# Patient Record
Sex: Female | Born: 1935
Health system: Southern US, Community
[De-identification: ages and names within clinical notes are randomized; demographics above are authoritative.]

## PROBLEM LIST (undated history)

## (undated) DIAGNOSIS — R269 Unspecified abnormalities of gait and mobility: Secondary | ICD-10-CM

## (undated) DIAGNOSIS — Z85820 Personal history of malignant melanoma of skin: Secondary | ICD-10-CM

## (undated) DIAGNOSIS — I5032 Chronic diastolic (congestive) heart failure: Secondary | ICD-10-CM

## (undated) DIAGNOSIS — R413 Other amnesia: Principal | ICD-10-CM

## (undated) DIAGNOSIS — I10 Essential (primary) hypertension: Secondary | ICD-10-CM

## (undated) DIAGNOSIS — I679 Cerebrovascular disease, unspecified: Secondary | ICD-10-CM

## (undated) DIAGNOSIS — M797 Fibromyalgia: Secondary | ICD-10-CM

## (undated) DIAGNOSIS — Z720 Tobacco use: Secondary | ICD-10-CM

## (undated) DIAGNOSIS — I482 Chronic atrial fibrillation, unspecified: Secondary | ICD-10-CM

## (undated) DIAGNOSIS — M1990 Unspecified osteoarthritis, unspecified site: Secondary | ICD-10-CM

## (undated) DIAGNOSIS — F329 Major depressive disorder, single episode, unspecified: Secondary | ICD-10-CM

## (undated) DIAGNOSIS — J339 Nasal polyp, unspecified: Secondary | ICD-10-CM

## (undated) DIAGNOSIS — I69398 Other sequelae of cerebral infarction: Secondary | ICD-10-CM

## (undated) DIAGNOSIS — I2510 Atherosclerotic heart disease of native coronary artery without angina pectoris: Secondary | ICD-10-CM

## (undated) DIAGNOSIS — G4700 Insomnia, unspecified: Secondary | ICD-10-CM

## (undated) DIAGNOSIS — N183 Chronic kidney disease, stage 3 unspecified: Secondary | ICD-10-CM

## (undated) DIAGNOSIS — E039 Hypothyroidism, unspecified: Secondary | ICD-10-CM

## (undated) DIAGNOSIS — E785 Hyperlipidemia, unspecified: Secondary | ICD-10-CM

## (undated) DIAGNOSIS — J189 Pneumonia, unspecified organism: Secondary | ICD-10-CM

## (undated) DIAGNOSIS — I119 Hypertensive heart disease without heart failure: Secondary | ICD-10-CM

## (undated) DIAGNOSIS — I5042 Chronic combined systolic (congestive) and diastolic (congestive) heart failure: Secondary | ICD-10-CM

## (undated) DIAGNOSIS — F32A Depression, unspecified: Secondary | ICD-10-CM

## (undated) DIAGNOSIS — I69359 Hemiplegia and hemiparesis following cerebral infarction affecting unspecified side: Secondary | ICD-10-CM

## (undated) DIAGNOSIS — J439 Emphysema, unspecified: Secondary | ICD-10-CM

## (undated) DIAGNOSIS — M4802 Spinal stenosis, cervical region: Secondary | ICD-10-CM

## (undated) DIAGNOSIS — K219 Gastro-esophageal reflux disease without esophagitis: Secondary | ICD-10-CM

## (undated) DIAGNOSIS — I639 Cerebral infarction, unspecified: Secondary | ICD-10-CM

## (undated) HISTORY — DX: Major depressive disorder, single episode, unspecified: F32.9

## (undated) HISTORY — DX: Other amnesia: R41.3

## (undated) HISTORY — DX: Spinal stenosis, cervical region: M48.02

## (undated) HISTORY — DX: Other sequelae of cerebral infarction: I69.398

## (undated) HISTORY — DX: Cerebral infarction, unspecified: I63.9

## (undated) HISTORY — DX: Insomnia, unspecified: G47.00

## (undated) HISTORY — PX: TONSILLECTOMY: SUR1361

## (undated) HISTORY — PX: OTHER SURGICAL HISTORY: SHX169

## (undated) HISTORY — DX: Hemiplegia and hemiparesis following cerebral infarction affecting unspecified side: I69.359

## (undated) HISTORY — DX: Essential (primary) hypertension: I10

## (undated) HISTORY — DX: Atherosclerotic heart disease of native coronary artery without angina pectoris: I25.10

## (undated) HISTORY — DX: Emphysema, unspecified: J43.9

## (undated) HISTORY — DX: Hyperlipidemia, unspecified: E78.5

## (undated) HISTORY — DX: Pneumonia, unspecified organism: J18.9

## (undated) HISTORY — PX: ABDOMINAL HYSTERECTOMY: SHX81

## (undated) HISTORY — DX: Depression, unspecified: F32.A

## (undated) HISTORY — DX: Unspecified abnormalities of gait and mobility: R26.9

## (undated) HISTORY — DX: Cerebrovascular disease, unspecified: I67.9

## (undated) HISTORY — DX: Chronic diastolic (congestive) heart failure: I50.32

## (undated) HISTORY — PX: BREAST LUMPECTOMY: SHX2

## (undated) HISTORY — DX: Unspecified osteoarthritis, unspecified site: M19.90

## (undated) HISTORY — DX: Nasal polyp, unspecified: J33.9

## (undated) HISTORY — DX: Hypothyroidism, unspecified: E03.9

## (undated) HISTORY — DX: Fibromyalgia: M79.7

## (undated) HISTORY — PX: EYE SURGERY: SHX253

---

## 1998-02-10 HISTORY — PX: CORONARY ARTERY BYPASS GRAFT: SHX141

## 1998-07-16 ENCOUNTER — Inpatient Hospital Stay (HOSPITAL_COMMUNITY): Admission: AD | Admit: 1998-07-16 | Discharge: 1998-07-23 | Payer: Self-pay | Admitting: Cardiology

## 1998-07-16 ENCOUNTER — Encounter: Payer: Self-pay | Admitting: Cardiothoracic Surgery

## 1998-07-17 ENCOUNTER — Encounter: Payer: Self-pay | Admitting: Cardiothoracic Surgery

## 1998-07-18 ENCOUNTER — Encounter: Payer: Self-pay | Admitting: Cardiothoracic Surgery

## 1998-07-19 ENCOUNTER — Encounter: Payer: Self-pay | Admitting: Cardiothoracic Surgery

## 1998-07-20 ENCOUNTER — Encounter: Payer: Self-pay | Admitting: Cardiothoracic Surgery

## 1998-09-18 ENCOUNTER — Other Ambulatory Visit: Admission: RE | Admit: 1998-09-18 | Discharge: 1998-09-18 | Payer: Self-pay | Admitting: Obstetrics and Gynecology

## 1999-03-28 ENCOUNTER — Inpatient Hospital Stay (HOSPITAL_COMMUNITY): Admission: EM | Admit: 1999-03-28 | Discharge: 1999-04-03 | Payer: Self-pay | Admitting: Emergency Medicine

## 1999-03-28 ENCOUNTER — Encounter: Payer: Self-pay | Admitting: Emergency Medicine

## 1999-03-29 ENCOUNTER — Encounter: Payer: Self-pay | Admitting: Neurology

## 1999-04-03 ENCOUNTER — Inpatient Hospital Stay (HOSPITAL_COMMUNITY)
Admission: RE | Admit: 1999-04-03 | Discharge: 1999-04-18 | Payer: Self-pay | Admitting: Physical Medicine & Rehabilitation

## 1999-04-05 ENCOUNTER — Encounter: Payer: Self-pay | Admitting: Physical Medicine & Rehabilitation

## 1999-04-08 ENCOUNTER — Encounter: Payer: Self-pay | Admitting: Cardiology

## 1999-04-23 ENCOUNTER — Encounter
Admission: RE | Admit: 1999-04-23 | Discharge: 1999-07-22 | Payer: Self-pay | Admitting: Physical Medicine & Rehabilitation

## 1999-06-28 ENCOUNTER — Encounter: Admission: RE | Admit: 1999-06-28 | Discharge: 1999-09-02 | Payer: Self-pay | Admitting: *Deleted

## 1999-07-29 ENCOUNTER — Encounter
Admission: RE | Admit: 1999-07-29 | Discharge: 1999-07-29 | Payer: Self-pay | Admitting: Physical Medicine & Rehabilitation

## 1999-10-04 ENCOUNTER — Encounter: Payer: Self-pay | Admitting: Neurology

## 1999-10-04 ENCOUNTER — Ambulatory Visit (HOSPITAL_COMMUNITY): Admission: RE | Admit: 1999-10-04 | Discharge: 1999-10-04 | Payer: Self-pay | Admitting: Neurology

## 2000-02-24 ENCOUNTER — Other Ambulatory Visit: Admission: RE | Admit: 2000-02-24 | Discharge: 2000-02-24 | Payer: Self-pay | Admitting: Obstetrics and Gynecology

## 2001-05-14 ENCOUNTER — Other Ambulatory Visit: Admission: RE | Admit: 2001-05-14 | Discharge: 2001-05-14 | Payer: Self-pay | Admitting: Obstetrics and Gynecology

## 2002-10-24 ENCOUNTER — Other Ambulatory Visit: Admission: RE | Admit: 2002-10-24 | Discharge: 2002-10-24 | Payer: Self-pay | Admitting: Obstetrics and Gynecology

## 2004-03-31 ENCOUNTER — Emergency Department (HOSPITAL_COMMUNITY): Admission: EM | Admit: 2004-03-31 | Discharge: 2004-03-31 | Payer: Self-pay | Admitting: Emergency Medicine

## 2007-04-06 ENCOUNTER — Encounter: Admission: RE | Admit: 2007-04-06 | Discharge: 2007-04-06 | Payer: Self-pay | Admitting: Neurology

## 2007-12-06 ENCOUNTER — Inpatient Hospital Stay (HOSPITAL_COMMUNITY): Admission: EM | Admit: 2007-12-06 | Discharge: 2007-12-08 | Payer: Self-pay | Admitting: Emergency Medicine

## 2007-12-07 ENCOUNTER — Encounter (INDEPENDENT_AMBULATORY_CARE_PROVIDER_SITE_OTHER): Payer: Self-pay | Admitting: Neurology

## 2008-01-10 ENCOUNTER — Encounter: Admission: RE | Admit: 2008-01-10 | Discharge: 2008-02-10 | Payer: Self-pay | Admitting: Neurology

## 2008-02-17 ENCOUNTER — Encounter: Admission: RE | Admit: 2008-02-17 | Discharge: 2008-04-12 | Payer: Self-pay | Admitting: Neurology

## 2009-09-12 ENCOUNTER — Ambulatory Visit: Payer: Self-pay | Admitting: Cardiology

## 2009-09-26 ENCOUNTER — Ambulatory Visit: Payer: Self-pay | Admitting: Cardiology

## 2009-10-26 ENCOUNTER — Ambulatory Visit: Payer: Self-pay | Admitting: Cardiovascular Disease

## 2009-11-26 ENCOUNTER — Ambulatory Visit: Payer: Self-pay | Admitting: Cardiology

## 2009-12-27 ENCOUNTER — Ambulatory Visit: Payer: Self-pay | Admitting: Cardiology

## 2010-01-06 ENCOUNTER — Telehealth (INDEPENDENT_AMBULATORY_CARE_PROVIDER_SITE_OTHER): Payer: Self-pay | Admitting: *Deleted

## 2010-01-30 ENCOUNTER — Ambulatory Visit: Payer: Self-pay | Admitting: Cardiovascular Disease

## 2010-03-04 ENCOUNTER — Ambulatory Visit: Payer: Self-pay | Admitting: Cardiology

## 2010-03-11 ENCOUNTER — Ambulatory Visit: Payer: Self-pay | Admitting: Cardiology

## 2010-03-14 NOTE — Progress Notes (Signed)
Summary: Cardiology - Sinus Pressure  Phone Note Call from Patient Call back at Home Phone [phone]   Reason for Call: Talk to Doctor Summary of Call: Returned call to patient who is complaining of productive cough with yellow sputum, severe headache above the eyes and overall tension in her head.  She states this occurs yearly and usually resolves with a ZPak.  I asked the patient if she has a primary care physician and she says that TB takes care of all her medical issues.  She says he usually calls in a ZPak and it clears up.  Pt denies any difficulty breathing, fever/chills, or CP currently.  I have called in a [facility]Pak to the [facility] on [address], [address] with no RF.  Pt appreciated the call and will call back in several days if symptoms are not relieved.   Initial call taken by: Robbi Garter NP-PA,  January 06, 2010 8:07 AM

## 2010-04-03 ENCOUNTER — Ambulatory Visit (INDEPENDENT_AMBULATORY_CARE_PROVIDER_SITE_OTHER): Payer: Medicare Other | Admitting: Cardiology

## 2010-04-03 DIAGNOSIS — E7800 Pure hypercholesterolemia, unspecified: Secondary | ICD-10-CM

## 2010-04-03 DIAGNOSIS — I4891 Unspecified atrial fibrillation: Secondary | ICD-10-CM

## 2010-04-03 DIAGNOSIS — Z7901 Long term (current) use of anticoagulants: Secondary | ICD-10-CM

## 2010-04-18 ENCOUNTER — Encounter (INDEPENDENT_AMBULATORY_CARE_PROVIDER_SITE_OTHER): Payer: Medicare Other

## 2010-04-18 ENCOUNTER — Encounter: Payer: Self-pay | Admitting: Cardiology

## 2010-04-18 DIAGNOSIS — I4891 Unspecified atrial fibrillation: Secondary | ICD-10-CM

## 2010-04-18 DIAGNOSIS — Z7901 Long term (current) use of anticoagulants: Secondary | ICD-10-CM

## 2010-04-20 ENCOUNTER — Emergency Department (HOSPITAL_COMMUNITY): Payer: Medicare Other

## 2010-04-20 ENCOUNTER — Emergency Department (HOSPITAL_COMMUNITY)
Admission: EM | Admit: 2010-04-20 | Discharge: 2010-04-20 | Disposition: A | Discharge status: Home / Self Care | Payer: Medicare Other | Attending: Emergency Medicine | Admitting: Emergency Medicine

## 2010-04-20 DIAGNOSIS — R29898 Other symptoms and signs involving the musculoskeletal system: Secondary | ICD-10-CM | POA: Insufficient documentation

## 2010-04-20 DIAGNOSIS — I69998 Other sequelae following unspecified cerebrovascular disease: Secondary | ICD-10-CM | POA: Insufficient documentation

## 2010-04-20 DIAGNOSIS — X500XXA Overexertion from strenuous movement or load, initial encounter: Secondary | ICD-10-CM | POA: Insufficient documentation

## 2010-04-20 DIAGNOSIS — S82899A Other fracture of unspecified lower leg, initial encounter for closed fracture: Secondary | ICD-10-CM | POA: Insufficient documentation

## 2010-04-20 DIAGNOSIS — Y92009 Unspecified place in unspecified non-institutional (private) residence as the place of occurrence of the external cause: Secondary | ICD-10-CM | POA: Insufficient documentation

## 2010-04-27 ENCOUNTER — Inpatient Hospital Stay (HOSPITAL_COMMUNITY)
Admission: EM | Admit: 2010-04-27 | Discharge: 2010-05-03 | DRG: 292 | Disposition: A | Discharge status: Home Health | Payer: Medicare Other | Attending: Cardiology | Admitting: Cardiology

## 2010-04-27 ENCOUNTER — Encounter: Payer: Self-pay | Admitting: Internal Medicine

## 2010-04-27 ENCOUNTER — Emergency Department (HOSPITAL_COMMUNITY): Payer: Medicare Other

## 2010-04-27 DIAGNOSIS — D509 Iron deficiency anemia, unspecified: Secondary | ICD-10-CM | POA: Diagnosis present

## 2010-04-27 DIAGNOSIS — I509 Heart failure, unspecified: Secondary | ICD-10-CM | POA: Diagnosis present

## 2010-04-27 DIAGNOSIS — G4700 Insomnia, unspecified: Secondary | ICD-10-CM | POA: Diagnosis present

## 2010-04-27 DIAGNOSIS — Z79899 Other long term (current) drug therapy: Secondary | ICD-10-CM

## 2010-04-27 DIAGNOSIS — J4489 Other specified chronic obstructive pulmonary disease: Secondary | ICD-10-CM | POA: Diagnosis present

## 2010-04-27 DIAGNOSIS — I10 Essential (primary) hypertension: Secondary | ICD-10-CM | POA: Diagnosis present

## 2010-04-27 DIAGNOSIS — M069 Rheumatoid arthritis, unspecified: Secondary | ICD-10-CM | POA: Diagnosis present

## 2010-04-27 DIAGNOSIS — F3289 Other specified depressive episodes: Secondary | ICD-10-CM | POA: Diagnosis present

## 2010-04-27 DIAGNOSIS — M810 Age-related osteoporosis without current pathological fracture: Secondary | ICD-10-CM | POA: Diagnosis present

## 2010-04-27 DIAGNOSIS — M109 Gout, unspecified: Secondary | ICD-10-CM | POA: Diagnosis present

## 2010-04-27 DIAGNOSIS — J449 Chronic obstructive pulmonary disease, unspecified: Secondary | ICD-10-CM | POA: Diagnosis present

## 2010-04-27 DIAGNOSIS — F329 Major depressive disorder, single episode, unspecified: Secondary | ICD-10-CM | POA: Diagnosis present

## 2010-04-27 DIAGNOSIS — Z8249 Family history of ischemic heart disease and other diseases of the circulatory system: Secondary | ICD-10-CM

## 2010-04-27 DIAGNOSIS — N390 Urinary tract infection, site not specified: Secondary | ICD-10-CM | POA: Diagnosis present

## 2010-04-27 DIAGNOSIS — Z7901 Long term (current) use of anticoagulants: Secondary | ICD-10-CM

## 2010-04-27 DIAGNOSIS — IMO0001 Reserved for inherently not codable concepts without codable children: Secondary | ICD-10-CM | POA: Diagnosis present

## 2010-04-27 DIAGNOSIS — E785 Hyperlipidemia, unspecified: Secondary | ICD-10-CM | POA: Diagnosis present

## 2010-04-27 DIAGNOSIS — Z7902 Long term (current) use of antithrombotics/antiplatelets: Secondary | ICD-10-CM

## 2010-04-27 DIAGNOSIS — R5381 Other malaise: Secondary | ICD-10-CM | POA: Diagnosis present

## 2010-04-27 DIAGNOSIS — I2510 Atherosclerotic heart disease of native coronary artery without angina pectoris: Secondary | ICD-10-CM | POA: Diagnosis present

## 2010-04-27 DIAGNOSIS — K5900 Constipation, unspecified: Secondary | ICD-10-CM | POA: Diagnosis present

## 2010-04-27 DIAGNOSIS — Z951 Presence of aortocoronary bypass graft: Secondary | ICD-10-CM

## 2010-04-27 DIAGNOSIS — I5043 Acute on chronic combined systolic (congestive) and diastolic (congestive) heart failure: Principal | ICD-10-CM | POA: Diagnosis present

## 2010-04-27 DIAGNOSIS — M4802 Spinal stenosis, cervical region: Secondary | ICD-10-CM | POA: Diagnosis present

## 2010-04-27 DIAGNOSIS — I4891 Unspecified atrial fibrillation: Secondary | ICD-10-CM | POA: Diagnosis present

## 2010-04-27 DIAGNOSIS — Z87891 Personal history of nicotine dependence: Secondary | ICD-10-CM

## 2010-04-27 DIAGNOSIS — Z8673 Personal history of transient ischemic attack (TIA), and cerebral infarction without residual deficits: Secondary | ICD-10-CM

## 2010-04-27 LAB — URINE MICROSCOPIC-ADD ON

## 2010-04-27 LAB — DIFFERENTIAL
Basophils Absolute: 0.1 10*3/uL (ref 0.0–0.1)
Basophils Relative: 1 % (ref 0–1)
Eosinophils Absolute: 0.3 10*3/uL (ref 0.0–0.7)
Eosinophils Relative: 2 % (ref 0–5)
Lymphocytes Relative: 18 % (ref 12–46)
Monocytes Relative: 8 % (ref 3–12)
Neutrophils Relative %: 71 % (ref 43–77)

## 2010-04-27 LAB — HEPATIC FUNCTION PANEL: Alkaline Phosphatase: 40 U/L (ref 39–117)

## 2010-04-27 LAB — BASIC METABOLIC PANEL
CO2: 24 mEq/L (ref 19–32)
Calcium: 9.6 mg/dL (ref 8.4–10.5)
Creatinine, Ser: 1.14 mg/dL (ref 0.4–1.2)
GFR calc Af Amer: 57 mL/min — ABNORMAL LOW (ref >60)
Sodium: 136 mEq/L (ref 135–145)

## 2010-04-27 LAB — TSH: TSH: 3.214 u[IU]/mL (ref 0.350–4.500)

## 2010-04-27 LAB — CK TOTAL AND CKMB (NOT AT ARMC): CK, MB: 11.7 ng/mL (ref 0.3–4.0)

## 2010-04-27 LAB — CBC
HCT: 34.3 % — ABNORMAL LOW (ref 36.0–46.0)
MCH: 29.9 pg (ref 26.0–34.0)
MCHC: 32.9 g/dL (ref 30.0–36.0)
RBC: 3.78 MIL/uL — ABNORMAL LOW (ref 3.87–5.11)
RDW: 14.1 % (ref 11.5–15.5)
WBC: 10.9 10*3/uL — ABNORMAL HIGH (ref 4.0–10.5)

## 2010-04-27 LAB — RAPID URINE DRUG SCREEN, HOSP PERFORMED
Amphetamines: NOT DETECTED
Benzodiazepines: NOT DETECTED
Opiates: NOT DETECTED

## 2010-04-27 LAB — URINALYSIS, ROUTINE W REFLEX MICROSCOPIC
Ketones, ur: NEGATIVE mg/dL
Protein, ur: NEGATIVE mg/dL

## 2010-04-27 LAB — HEMOGLOBIN A1C: Mean Plasma Glucose: 134 mg/dL — ABNORMAL HIGH (ref <117)

## 2010-04-27 LAB — CARDIAC PANEL(CRET KIN+CKTOT+MB+TROPI)
CK, MB: 10.8 ng/mL (ref 0.3–4.0)
Troponin I: 0.01 ng/mL (ref 0.00–0.06)

## 2010-04-27 LAB — BRAIN NATRIURETIC PEPTIDE: Pro B Natriuretic peptide (BNP): 501 pg/mL — ABNORMAL HIGH (ref 0.0–100.0)

## 2010-04-28 ENCOUNTER — Inpatient Hospital Stay (HOSPITAL_COMMUNITY): Payer: Medicare Other

## 2010-04-28 DIAGNOSIS — R0602 Shortness of breath: Secondary | ICD-10-CM

## 2010-04-28 DIAGNOSIS — N390 Urinary tract infection, site not specified: Secondary | ICD-10-CM

## 2010-04-28 DIAGNOSIS — R0989 Other specified symptoms and signs involving the circulatory and respiratory systems: Secondary | ICD-10-CM

## 2010-04-28 DIAGNOSIS — R4182 Altered mental status, unspecified: Secondary | ICD-10-CM

## 2010-04-28 DIAGNOSIS — R0609 Other forms of dyspnea: Secondary | ICD-10-CM

## 2010-04-28 LAB — LIPID PANEL
Cholesterol: 235 mg/dL — ABNORMAL HIGH (ref 0–200)
HDL: 39 mg/dL — ABNORMAL LOW (ref >39)
LDL Cholesterol: 161 mg/dL — ABNORMAL HIGH (ref 0–99)
Total CHOL/HDL Ratio: 6 RATIO

## 2010-04-28 LAB — CARDIAC PANEL(CRET KIN+CKTOT+MB+TROPI)
CK, MB: 7.8 ng/mL (ref 0.3–4.0)
CK, MB: 7.3 ng/mL (ref 0.3–4.0)
Relative Index: 2.6 — ABNORMAL HIGH (ref 0.0–2.5)
Total CK: 298 U/L — ABNORMAL HIGH (ref 7–177)
Total CK: 321 U/L — ABNORMAL HIGH (ref 7–177)
Troponin I: 0.01 ng/mL (ref 0.00–0.06)
Troponin I: 0.02 ng/mL (ref 0.00–0.06)

## 2010-04-28 LAB — IRON AND TIBC
Iron: 38 ug/dL — ABNORMAL LOW (ref 42–135)
Saturation Ratios: 16 % — ABNORMAL LOW (ref 20–55)
TIBC: 245 ug/dL — ABNORMAL LOW (ref 250–470)

## 2010-04-28 LAB — FOLATE: Folate: >20 ng/mL

## 2010-04-28 LAB — CBC
Hemoglobin: 9.5 g/dL — ABNORMAL LOW (ref 12.0–15.0)
MCH: 30.5 pg (ref 26.0–34.0)
MCV: 90 fL (ref 78.0–100.0)
Platelets: 224 10*3/uL (ref 150–400)
WBC: 7.3 10*3/uL (ref 4.0–10.5)

## 2010-04-28 LAB — FERRITIN: Ferritin: 173 ng/mL (ref 10–291)

## 2010-04-28 LAB — PROTIME-INR
INR: 2.41 — ABNORMAL HIGH (ref 0.00–1.49)
Prothrombin Time: 26.4 seconds — ABNORMAL HIGH (ref 11.6–15.2)

## 2010-04-28 LAB — BASIC METABOLIC PANEL
BUN: 20 mg/dL (ref 6–23)
Calcium: 8.7 mg/dL (ref 8.4–10.5)
Chloride: 103 mEq/L (ref 96–112)
Creatinine, Ser: 0.96 mg/dL (ref 0.4–1.2)
GFR calc non Af Amer: 57 mL/min — ABNORMAL LOW (ref >60)
Sodium: 137 mEq/L (ref 135–145)

## 2010-04-28 MED ORDER — IOHEXOL 300 MG/ML  SOLN
80.0000 mL | Freq: Once | INTRAMUSCULAR | Status: AC | PRN
  Administered 2010-04-28: 80 mL via INTRAVENOUS

## 2010-04-29 DIAGNOSIS — I517 Cardiomegaly: Secondary | ICD-10-CM

## 2010-04-29 DIAGNOSIS — R4182 Altered mental status, unspecified: Secondary | ICD-10-CM

## 2010-04-29 DIAGNOSIS — R0989 Other specified symptoms and signs involving the circulatory and respiratory systems: Secondary | ICD-10-CM

## 2010-04-29 DIAGNOSIS — N390 Urinary tract infection, site not specified: Secondary | ICD-10-CM

## 2010-04-29 DIAGNOSIS — R0602 Shortness of breath: Secondary | ICD-10-CM

## 2010-04-29 DIAGNOSIS — R0609 Other forms of dyspnea: Secondary | ICD-10-CM

## 2010-04-29 DIAGNOSIS — R079 Chest pain, unspecified: Secondary | ICD-10-CM

## 2010-04-29 LAB — CBC
HCT: 30.3 % — ABNORMAL LOW (ref 36.0–46.0)
MCH: 29.8 pg (ref 26.0–34.0)
MCV: 91.3 fL (ref 78.0–100.0)
Platelets: 247 10*3/uL (ref 150–400)
RDW: 14.1 % (ref 11.5–15.5)

## 2010-04-29 LAB — BASIC METABOLIC PANEL
BUN: 13 mg/dL (ref 6–23)
CO2: 27 mEq/L (ref 19–32)
Chloride: 104 mEq/L (ref 96–112)
Creatinine, Ser: 0.83 mg/dL (ref 0.4–1.2)
Potassium: 3.9 mEq/L (ref 3.5–5.1)

## 2010-04-30 DIAGNOSIS — R0989 Other specified symptoms and signs involving the circulatory and respiratory systems: Secondary | ICD-10-CM

## 2010-04-30 DIAGNOSIS — R0609 Other forms of dyspnea: Secondary | ICD-10-CM

## 2010-04-30 LAB — PROTIME-INR
INR: 2.1 — ABNORMAL HIGH (ref 0.00–1.49)
Prothrombin Time: 23.7 seconds — ABNORMAL HIGH (ref 11.6–15.2)

## 2010-04-30 LAB — BASIC METABOLIC PANEL
BUN: 8 mg/dL (ref 6–23)
Chloride: 101 mEq/L (ref 96–112)
Creatinine, Ser: 0.79 mg/dL (ref 0.4–1.2)
Glucose, Bld: 118 mg/dL — ABNORMAL HIGH (ref 70–99)
Potassium: 3.4 mEq/L — ABNORMAL LOW (ref 3.5–5.1)

## 2010-04-30 LAB — CBC
HCT: 29.3 % — ABNORMAL LOW (ref 36.0–46.0)
MCH: 30 pg (ref 26.0–34.0)
MCHC: 32.8 g/dL (ref 30.0–36.0)
RDW: 14.1 % (ref 11.5–15.5)

## 2010-04-30 LAB — URINE CULTURE

## 2010-04-30 LAB — BRAIN NATRIURETIC PEPTIDE: Pro B Natriuretic peptide (BNP): 403 pg/mL — ABNORMAL HIGH (ref 0.0–100.0)

## 2010-04-30 NOTE — Initial Assessments (Signed)
Internal Medicine History and Physical  ***Place in front of Progress Notes section of chart***  Admission Date: 04/27/2010 Attending Pysician: Dr. Nyra Capes First Contact: Dr Loistine Chance [phone] Second contact: Dr Arvilla Market [phone]  [facility], [place], or after 5pm Weekdays:  1st Contact: [phone] 2nd Contact: [phone]  PCP: none Cardiologist: Dr Patty Sermons (LB cards)  CC: Shortness of Breath  HPI: This is a 76 year old female with PMH significant for Afib, CHF and stroke who presented to the ED with SOB. Patient noted that she was progressively getting SOB for the past week. It started off on exertion but today morning she woke up with SOB.  Patient further noted some chest discomfort which is worse with exertion and relieved with rest. She endorses also dizziness desribes as  "lightheadedness" especially when standing to long. She mentioned that she also has become more weak then in the past - a generalized fatigue. She also lost her appetite and just does not want to eat anything. She mentionted that has trouble swollowing since her stroke in 2001 but this has been getting better. On the 10th of March patient aquired a medial malleolar fracture after standing up the wrong way. She did not have a fall at that time. She further mentioned that she has been constipated since 1 week but denies any vomiting.   Two Daughters were present during the conversation and noted that since in the ED patient had episodes of altered mental status. She was asking inapproprate questions and would occasionally not know where she was. She had no episodes at home. Denies any recent sick contact, cough, fevers or chills, surgeries or travel. Denies any nausea or vomiting or any urinary changes.   ALLERGIES: Adhesive tape, Ceclor (causing rash, hives)   PAST MEDICAL HISTORY:  1. Right brain transient ischemic attack with left facial weakness resolved, 2009  2. Atrial fibrillation.   3. Hypertension.   4.  Hyperlipidemia.   5. Cervical spinal stenosis.   6. Fibromyalgia.   7. Hypothyroidism.   8. Congestive heart failure, chronic. 2 D-Echo (2009) with normal LV function with an EF of 50 %.  9. Osteoarthritis.   10.Gout  11.Depression.   12.Coronary artery disease, s/p CABG    13.History of right pontine infarct in 2001 14. Medial malleolar fracture (04/20/10)  SURGICAL MEDICAL HISTORY: 1. Hysterectomy  2. Lumpectomy, benign  MEDICATIONS:  1. Atenolol 50 mg q.a.m.   2. Plavix 75 mg q.a.m.   3. Lasix 20 mg b.i.d.   4. Celebrex 200 mg b.i.d.   5.Amlodipine 10 mg by mouth daily    6. Citalopram 20 mg twice a day.   7. Amitriptyline 50 mg q.p.m.   8. Lanoxin 0.125 mg q.p.m.   10.Coumadin 5 mg every other day.   11. Synthroid 25 mcg every day.   12. Allopurinol 300 mg nightly.   13. WelChol 1250 two times a day.   14. Atacand 32 mg a day.   15. Valsartan 40 mg by mouth daily  16. Colchicine 0.6 by mouth  as needed  17. Digoxin 0.125 mg by mouth daily 18. Senna/Docusate  19. Famotidine 20 mg by mouth daily  20. Potassium chloride 20 meq by mouth daily    SOCIAL HISTORY:    The patient states that she has a history of smoking over 30 years ago,   is widowed, is a nondrinker, and nonuser of illicit drugs.  Lives at home with her husband.  FAMILY HISTORY The patient's mother died at 35  of an aneurysm bleed.  Her father died of heart failure. The patient had several brothers with coronary artery diseases, 1 underwent CABG, 1 sister is healthy, 2 others have diabetes.  The patient's daughters are healthy.    ROS: As per HPI, all other systems reviewed and negative  VITALS:  T:  97.2 P:64  BP:137/73  R:20  O2SAT:95%  ON:RA  PHYSICAL EXAM: Gen: Patient is in NAD, Pleasant. Eyes: PERRL, EOMI, No signs of anemia or jaundince. ENT: MMM, OP clear, No erythema, thrush or exudates. Neck: Supple, No carotid Bruits, No JVD, No thyromegaly Resp: decrased air movement bilaterally,  rales at left and right base CVS: Irregular rate, S1S2  GI: Abdomen is soft. ND, NT, NG, NR, BS+.  Ext: 1+ pitting  edema, cyanosis or clubbing. Left lower extremity in cast.  Skin: Bruise on left thigh Lymph: No palpable lymphadenopathy. MS: Moving all 4 extremities. Neuro: A&O X2, CN II - XII are grossly intact. Motor strength is 5/5 in the all 4 extremities, Sensations intact to light touch, Cerebellar signs negative. One episode of confusion. Not able to recall that she is at [facility] in [place]. Psych: Appropriate   LABS:   WBC                                      10.9       h      4.0-10.5         K/uL  RBC                                      3.78       l      3.87-5.11        MIL/uL  Hemoglobin (HGB)                         11.3       l      12.0-15.0        g/dL  Hematocrit (HCT)                         34.3       l      36.0-46.0        %  MCV                                      90.7              78.0-100.0       fL  MCH -                                    29.9              26.0-34.0        pg  MCHC                                     32.9  30.0-36.0        g/dL  RDW                                      14.1              11.5-15.5        %  Platelet Count (PLT)                     279               150-400          K/uL  Neutrophils, %                           71                43-77            %  Lymphocytes, %                           18                12-46            %  Monocytes, %                             8                 3-12             %  Eosinophils, %                           2                 0-5              %  Basophils, %                             1                 0-1              %  Neutrophils, Absolute                    7.7               1.7-7.7          K/uL  Lymphocytes, Absolute                    1.9               0.7-4.0          K/uL  Monocytes, Absolute                      0.9               0.1-1.0          K/uL   Eosinophils, Absolute                    0.3  0.0-0.7          K/uL  Basophils, Absolute                      0.1               0.0-0.1          K/uL   Sodium (NA)                              136               135-145          mEq/L  Potassium (K)                            4.4               3.5-5.1          mEq/L  Chloride                                 103               96-112           mEq/L  CO2                                      24                19-32            mEq/L  Glucose                                  127        h      70-99            mg/dL  BUN                                      21                6-23             mg/dL  Creatinine                               1.14              0.4-1.2          mg/dL  GFR, Est Non African American            47         l      >60              mL/min  GFR, Est African American                57         l      >60              mL/min    Oversized comment, see footnote  1  Calcium                                  9.6               8.4-10.5         mg/dL   Protime ( Prothrombin Time)              33.0       h      11.6-15.2        seconds  INR                                      3.23       h      0.00-1.49  Beta Natriuretic Peptide                 501.0      h      0.0-100.0        pg/mL   Creatine Kinase, Total                   360        h      7-177            U/L  CK, MB                                   11.7       H      0.3-4.0          ng/mL  Relative Index                           3.3        h      0.0-2.5  Troponin I                               0.03              0.00-0.06        ng/mL   Color, Urine                             YELLOW            YELLOW  Appearance                               CLOUDY     a      CLEAR  Specific Gravity                         1.014             1.005-1.030  pH                                       5.5               5.0-8.0  Urine Glucose  NEGATIVE           NEG              mg/dL  Bilirubin                                NEGATIVE          NEG  Ketones                                  NEGATIVE          NEG              mg/dL  Blood                                    NEGATIVE          NEG  Protein                                  NEGATIVE          NEG              mg/dL  Urobilinogen                             0.2               0.0-1.0          mg/dL  Nitrite                                  NEGATIVE          NEG  Leukocytes                               MODERATE   a      NEG  Squamous Epithelial / LPF                FEW        a      RARE  Casts / HPF                              SEE NOTE.  a      NEG    HYALINE CASTS  WBC / HPF                                21-50             <3               WBC/hpf  Bacteria / HPF                           FEW        a      RARE   Bilirubin, Total  0.9               0.3-1.2          mg/dL  Bilirubin, Direct                        0.1               0.0-0.3          mg/dL  Indirect Bilirubin                       0.8               0.3-0.9          mg/dL  Alkaline Phosphatase                     40                39-117           U/L  SGOT (AST)                               30                0-37             U/L  SGPT (ALT)                               21                0-35             U/L  Total  Protein                           6.1               6.0-8.3          g/dL  Albumin-Blood                            3.7               3.5-5.2          g/dL  CXR: Findings: Two views of the chest demonstrate right basilardensities suggestive for pleural fluid and atelectasis.  Small linear densities in the right lower lung are suggestive for interstitial edema.  Patient is status post median sternotomy. Upper lungs are clear. Heart size is normal.  IMPRESSION: Right lung base densities are suggestive for atelectasis and  pleural fluid.  There may be a small amount of interstitial  edema.    ASSESSMENT AND PLAN: 1. Shortness of Breath likely due to congestive heart failure. Other DD include pneumonia unlikely with no CXR finding, PE unlikely with progressive SOB on excertion, not tachycardic, not tachypneic and INR of 3.23. Other DD include CAD especially in the setting of chest pain and history of CAD s/p 1 vessel CABG.  No pneumothorax on CXR. - Will admit to telemetry - IV lasix, continue digoxin, daily weights, strict I&O's,cardiac enzymes  - consider to repeat 2- D Echo   2. Chest pain on excertion concerning for angina. ECG does not show any significant changes. Will cycle cardiac enzymes. At this point patient  is chest pain free.  - Consider Cardiology consult for possible stress test .  3. Dizziness unclear ethiology. Patient reports of balance problems since stroke in 2001 - Will check orthostatic BP.  - WIll hold IVFs for now in setting of possible CHF exac - PT/OT consult  4. UTI: Will get blood culture and urine culture.  - Will start Cipro 500 mg two times a day x 3 days for empiric tx  5. Constipation: will start on Sennakot.  No symptoms concerning for SBO or other concerning process.  6. Afib: rate controlled.  - Will continue home medication.  - Warfarin per pharmacy  7. Hypertension: continue on home medicaton including Valsartan and Atenolol  8. AMS likely due to UTI. DD include pain medication related since patient was started on oxycodone and ultram for the fracture.  At this point unlikey due to an acute neurological event as pt is without any neurological changes.   - Will treat UTI.  -Check UDS.  - Will continue to monitor. - Will use tylenol for mild - mod pain and morphine for mod-severe pain  9. Hypothyroidism:  - Will check TSH and resume synthroid once med rec completed and TSH back  10. Hyperlipidemia. Will check fasting lipid panel. and continue vytorin  11. CVA : stable. Will continue on plavix.   12. Depression: will  continue Celexa and hold amitriptyline as it is unclear if pt is taking this medication.  13. VTE Proph: on Warfarin supratherapeutic INR  Attending Physician:  I have seen and examined the patient. I reviewed the resident note and agree with the findings and plan of care as documented. My additions and revisions are included.    Signature:___________________________________________

## 2010-05-01 ENCOUNTER — Inpatient Hospital Stay (HOSPITAL_COMMUNITY): Payer: Medicare Other

## 2010-05-01 DIAGNOSIS — R0989 Other specified symptoms and signs involving the circulatory and respiratory systems: Secondary | ICD-10-CM

## 2010-05-01 DIAGNOSIS — R0609 Other forms of dyspnea: Secondary | ICD-10-CM

## 2010-05-01 DIAGNOSIS — N390 Urinary tract infection, site not specified: Secondary | ICD-10-CM

## 2010-05-01 DIAGNOSIS — R0602 Shortness of breath: Secondary | ICD-10-CM

## 2010-05-01 DIAGNOSIS — R4182 Altered mental status, unspecified: Secondary | ICD-10-CM

## 2010-05-01 LAB — BASIC METABOLIC PANEL
BUN: 10 mg/dL (ref 6–23)
CO2: 28 mEq/L (ref 19–32)
Chloride: 106 mEq/L (ref 96–112)
Creatinine, Ser: 0.74 mg/dL (ref 0.4–1.2)
Glucose, Bld: 103 mg/dL — ABNORMAL HIGH (ref 70–99)
Potassium: 3.7 mEq/L (ref 3.5–5.1)

## 2010-05-01 LAB — PROTIME-INR: Prothrombin Time: 25.4 seconds — ABNORMAL HIGH (ref 11.6–15.2)

## 2010-05-01 MED ORDER — TECHNETIUM TC 99M TETROFOSMIN IV KIT
10.0000 | PACK | Freq: Once | INTRAVENOUS | Status: AC | PRN
Start: 2010-05-01 — End: 2010-05-01
  Administered 2010-05-01: 10 via INTRAVENOUS

## 2010-05-01 MED ORDER — TECHNETIUM TC 99M TETROFOSMIN IV KIT
30.0000 | PACK | Freq: Once | INTRAVENOUS | Status: AC | PRN
  Administered 2010-05-01: 30 via INTRAVENOUS

## 2010-05-02 ENCOUNTER — Inpatient Hospital Stay (HOSPITAL_COMMUNITY): Payer: Medicare Other

## 2010-05-02 LAB — CBC
HCT: 34.9 % — ABNORMAL LOW (ref 36.0–46.0)
Hemoglobin: 11.6 g/dL — ABNORMAL LOW (ref 12.0–15.0)
MCV: 89.7 fL (ref 78.0–100.0)
RBC: 3.89 MIL/uL (ref 3.87–5.11)
RDW: 14.1 % (ref 11.5–15.5)
WBC: 7.4 10*3/uL (ref 4.0–10.5)

## 2010-05-02 LAB — BASIC METABOLIC PANEL
BUN: 8 mg/dL (ref 6–23)
Chloride: 104 mEq/L (ref 96–112)
GFR calc Af Amer: >60 mL/min (ref >60)
GFR calc non Af Amer: >60 mL/min (ref >60)
Potassium: 3.1 mEq/L — ABNORMAL LOW (ref 3.5–5.1)
Sodium: 139 mEq/L (ref 135–145)

## 2010-05-02 LAB — PROTIME-INR: INR: 2.18 — ABNORMAL HIGH (ref 0.00–1.49)

## 2010-05-02 LAB — BRAIN NATRIURETIC PEPTIDE: Pro B Natriuretic peptide (BNP): 225 pg/mL — ABNORMAL HIGH (ref 0.0–100.0)

## 2010-05-03 ENCOUNTER — Inpatient Hospital Stay (HOSPITAL_COMMUNITY): Payer: Medicare Other

## 2010-05-03 DIAGNOSIS — J438 Other emphysema: Secondary | ICD-10-CM

## 2010-05-03 DIAGNOSIS — J449 Chronic obstructive pulmonary disease, unspecified: Secondary | ICD-10-CM

## 2010-05-03 DIAGNOSIS — R0602 Shortness of breath: Secondary | ICD-10-CM

## 2010-05-03 LAB — CULTURE, BLOOD (ROUTINE X 2)
Culture  Setup Time: 201203171746
Culture: NO GROWTH

## 2010-05-03 LAB — COMPREHENSIVE METABOLIC PANEL
Alkaline Phosphatase: 42 U/L (ref 39–117)
BUN: 10 mg/dL (ref 6–23)
CO2: 26 mEq/L (ref 19–32)
Chloride: 106 mEq/L (ref 96–112)
GFR calc non Af Amer: >60 mL/min (ref >60)
Glucose, Bld: 108 mg/dL — ABNORMAL HIGH (ref 70–99)
Potassium: 4 mEq/L (ref 3.5–5.1)
Total Bilirubin: 0.8 mg/dL (ref 0.3–1.2)
Total Protein: 6.2 g/dL (ref 6.0–8.3)

## 2010-05-03 LAB — MAGNESIUM: Magnesium: 2.2 mg/dL (ref 1.5–2.5)

## 2010-05-03 LAB — CBC
HCT: 32.2 % — ABNORMAL LOW (ref 36.0–46.0)
MCHC: 32.6 g/dL (ref 30.0–36.0)
RDW: 13.8 % (ref 11.5–15.5)

## 2010-05-03 LAB — PROTIME-INR: INR: 1.92 — ABNORMAL HIGH (ref 0.00–1.49)

## 2010-05-03 LAB — CLOSTRIDIUM DIFFICILE BY PCR: Toxigenic C. Difficile by PCR: NEGATIVE

## 2010-05-06 ENCOUNTER — Ambulatory Visit (INDEPENDENT_AMBULATORY_CARE_PROVIDER_SITE_OTHER): Payer: Medicare Other | Admitting: *Deleted

## 2010-05-06 DIAGNOSIS — I6350 Cerebral infarction due to unspecified occlusion or stenosis of unspecified cerebral artery: Secondary | ICD-10-CM | POA: Insufficient documentation

## 2010-05-06 DIAGNOSIS — I4891 Unspecified atrial fibrillation: Secondary | ICD-10-CM | POA: Insufficient documentation

## 2010-05-06 DIAGNOSIS — Z7901 Long term (current) use of anticoagulants: Secondary | ICD-10-CM

## 2010-05-06 DIAGNOSIS — Z79899 Other long term (current) drug therapy: Secondary | ICD-10-CM

## 2010-05-06 LAB — BASIC METABOLIC PANEL
BUN: 15 mg/dL (ref 6–23)
Creat: 0.96 mg/dL (ref 0.40–1.20)

## 2010-05-06 NOTE — Progress Notes (Signed)
Addended by: Carman Ching on: 05/06/2010 01:05 PM   Modules accepted: Orders

## 2010-05-06 NOTE — Consult Note (Signed)
Amber [name], Amber [name]                ACCOUNT NO.:  [id number]  MEDICAL RECORD NO.:  [id number]           PATIENT TYPE:  I  LOCATION:  4733                         FACILITY:  [facility]  PHYSICIAN:  Coralyn Helling, MD        DATE OF BIRTH:  03/12/1934  DATE OF CONSULTATION:  05/02/2010 DATE OF DISCHARGE:                                CONSULTATION   REFERRING PHYSICIAN:  Rollene Rotunda, MD, [facility]  REASON FOR CONSULTATION:  Dyspnea.  CHIEF COMPLAINT:  Shortness of breath and chest pain.  HISTORY OF PRESENT ILLNESS:  Ms. Bryars is a 76 year old female who was admitted on April 27, 2010, with sudden onset of chest pain and dyspnea. She was admitted to the hospital and felt to have a CHF exacerbation in the setting of atrial fibrillation, coronary artery disease, and hypertension.  She has had rate control of her atrial fibrillation and has had aggressive diuresis by the Cardiology Service and Primary Care Team.  In spite of this, she continues to have symptoms of dyspnea and oxygen desaturation with exertion and as a result pulmonary consultation was requested.  She has a prior history of smoking.  She smoked up to one pack a day for about 25 years and quit about 35 years ago.  She also reports having a history of pneumonia x2, the last some being in 1990. There is no history of tuberculosis.  She does have intermittent episodes of wheezing and feels like it is hard to get into her lungs at times.  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  In questioning of her family, however, they do report that they have noticed significant worsening of her breathing symptoms with exertion especially over the last 4-5 months.  She has not  had any recent fever, chills, or sweats. She denies any skin rashes or gland swelling.  She says she used to have a history of heavy fever, but this has not been a problem recently.  She was told that she had asthma when she was pregnant, but this resolved and she is not aware of having any problems since with that, although her medical records report that she does have a history of nasal polyposis.  PAST MEDICAL HISTORY:  Significant for: 1. Stroke. 2. Hypertension. 3. Coronary artery disease. 4. Fibromyalgia. 5. Hyperlipidemia. 6. Insomnia. 7. Nasal polyposis 8. Atrial fibrillation. 9. Cervical spinal stenosis. 10.Hypothyroidism. 11.Gout. 12.Depression. 13.Osteoarthritis. 14.Osteoporosis.  PAST SURGICAL HISTORY:  Significant for: 1. Coronary artery bypass grafting. 2. Hysterectomy. 3. Breast lumpectomy for benign lesion.  ALLERGIES:  Listed as STATINS which cause an increase in her creatine kinase.  OUTPATIENT MEDICATIONS: 1. Allopurinol 300 mg nightly. 2. Elavil 50 mg nightly. 3. Norvasc 10 mg daily. 4. Atenolol 50 mg daily. 5. Celebrex 200 mg b.i.d. 6. Citalopram 20 mg b.i.d. 7.  Colchicine 0.6 mg p.r.n. 8. Coumadin as directed. 9. Diovan 40 mg daily. 10.Pepcid 20 mg p.r.n. 11.Lasix 50 mg b.i.d. 12.Digoxin 0.125 mg daily. 13.Plavix 75 mg daily. 14.Potassium chloride 20 mEq daily. 15.Synthroid 25 mcg daily. 16.WelChol 625 mg 2 tablets b.i.d.  SOCIAL HISTORY:  She is a retired [employer] operational officer.  She quit smoking about 35 years ago.  FAMILY HISTORY:  Significant for coronary artery disease, congestive heart failure, and diabetes.  REVIEW OF SYSTEMS:  A 12-point review of systems is negative except for as stated above.  PHYSICAL EXAMINATION:  GENERAL:  She is seen in the hospital room.  She is awake and alert.  Does not appear to be in acute distress. VITAL SIGNS:  Temperature is 98.1.  Blood pressure is 135/56.  Heart rate is 75 and irregular.  Respiration rate  is 18.  Oxygen saturation is 94% on room air. HEENT:  Pupils are reactive.  Extraocular movements are intact.  There is no sinus tenderness.  No nasal discharge.  No oral lesions.  No lymphadenopathy.  No thyromegaly. HEART:  S1 and S3, irregular, but no murmur. CHEST:  She had faint rales at the bases, but there was no wheezing. There is no dullness. ABDOMEN:  Soft and nontender.  Positive bowel sounds. EXTREMITIES:  There is no edema, cyanosis, or clubbing.  She was wearing a boot over her left ankle after recent left ankle fracture. NEUROLOGIC:  Cranial nerves II-XII intact.  She had normal strength.  Sodium is 139, potassium is 2.1, chloride is 104, CO2 is 26, BUN is 8, and creatinine 0.6.  INR is 2.18.  WBC is 7.4, hemoglobin is 9.6, hematocrit is 29.3, and white count is 256.  Urine culture from April 27, 2010, was positive for Serratia and enterococcus.  Echocardiogram from April 29, 2010, showed an ejection fraction of 55% and pulmonary artery systolic pressure 44.  Left ankle x-ray from April 20, 2010, showed a left oblique medial malleolus fracture.  Chest x-ray from April 27, 2010, showed atelectasis, interstitial edema, and pleural effusion. CT scan of the chest from April 28, 2010, with intravenous contrast, was negative for pulmonary embolism, but showed changes of congestive heart failure and emphysema.  IMPRESSION:  This is a 76 year old female with a prior history of tobacco abuse with symptoms of progressive dyspnea associated with intermittent episodes of wheezing.  She did have radiographic evidence for emphysema in CT scan of the chest.  She does also have a history of coronary artery disease, hypertension, and atrial fibrillation, however, these are being optimally managed by the Cardiology Service and Primary Care Team.  In spite of this, she continues to have respiratory symptoms.  I am concerned that she may have some degree of obstructive lung disease.   Pulmonary function tests have been ordered by the Cardiology Service and I will follow up on this.  I will also start her empirically on Spiriva 1 puff daily and depending upon her pulmonary function testing and clinical response we will determine if she needs to have further adjustment of her inhaler regimen.  I will also repeat her chest x-ray to document clearing of her previous effusions, atelectasis, and interstitial edema pattern.  Please note that the patient was adamant that she does not have a history of rheumatoid arthritis even though this has been listed in several places in her medical history.  She also states that she is followed by Rheumatology at [facility] and has never been told that she has rheumatoid arthritis  and she says she has osteoarthritis, gout, and fibromyalgia.     Coralyn Helling, MD     VS/MEDQ  D:  05/02/2010  T:  05/03/2010  Job:  [id number]  Electronically Signed by Coralyn Helling MD on 05/06/2010 12:40:27 AM

## 2010-05-07 ENCOUNTER — Telehealth: Payer: Self-pay | Admitting: *Deleted

## 2010-05-07 NOTE — Telephone Encounter (Signed)
Pt is coming in 06/04/10 at 11:00 for hfu. Will call to get pfts  Carver Fila, CMA

## 2010-05-07 NOTE — Telephone Encounter (Signed)
Message copied by Carver Fila on Tue May 07, 2010  6:22 PM ------      Message from: Coralyn Helling      Created: Mon May 06, 2010  1:27 AM      Regarding: Schedule HFU       Please schedule Ms. Walkowski for HFU in mid to late April.  She had PFT from 05/02/10 at [facility].  Please have this included into Epic if.  Thanks.

## 2010-05-09 ENCOUNTER — Encounter: Payer: Medicare Other | Admitting: *Deleted

## 2010-05-09 ENCOUNTER — Other Ambulatory Visit: Payer: Self-pay | Admitting: Cardiology

## 2010-05-09 DIAGNOSIS — F329 Major depressive disorder, single episode, unspecified: Secondary | ICD-10-CM

## 2010-05-10 ENCOUNTER — Ambulatory Visit (INDEPENDENT_AMBULATORY_CARE_PROVIDER_SITE_OTHER): Payer: Medicare Other | Admitting: Cardiology

## 2010-05-10 ENCOUNTER — Encounter: Payer: Self-pay | Admitting: Cardiology

## 2010-05-10 VITALS — BP 150/80 | HR 70 | Wt 160.0 lb

## 2010-05-10 DIAGNOSIS — I6350 Cerebral infarction due to unspecified occlusion or stenosis of unspecified cerebral artery: Secondary | ICD-10-CM

## 2010-05-10 DIAGNOSIS — M810 Age-related osteoporosis without current pathological fracture: Secondary | ICD-10-CM | POA: Insufficient documentation

## 2010-05-10 DIAGNOSIS — I4891 Unspecified atrial fibrillation: Secondary | ICD-10-CM

## 2010-05-10 DIAGNOSIS — M797 Fibromyalgia: Secondary | ICD-10-CM

## 2010-05-10 DIAGNOSIS — F5105 Insomnia due to other mental disorder: Secondary | ICD-10-CM

## 2010-05-10 DIAGNOSIS — F32A Depression, unspecified: Secondary | ICD-10-CM | POA: Insufficient documentation

## 2010-05-10 DIAGNOSIS — I25709 Atherosclerosis of coronary artery bypass graft(s), unspecified, with unspecified angina pectoris: Secondary | ICD-10-CM | POA: Insufficient documentation

## 2010-05-10 DIAGNOSIS — D649 Anemia, unspecified: Secondary | ICD-10-CM | POA: Insufficient documentation

## 2010-05-10 DIAGNOSIS — I2510 Atherosclerotic heart disease of native coronary artery without angina pectoris: Secondary | ICD-10-CM | POA: Insufficient documentation

## 2010-05-10 DIAGNOSIS — I110 Hypertensive heart disease with heart failure: Secondary | ICD-10-CM

## 2010-05-10 DIAGNOSIS — M1990 Unspecified osteoarthritis, unspecified site: Secondary | ICD-10-CM

## 2010-05-10 DIAGNOSIS — Z951 Presence of aortocoronary bypass graft: Secondary | ICD-10-CM

## 2010-05-10 DIAGNOSIS — E039 Hypothyroidism, unspecified: Secondary | ICD-10-CM | POA: Insufficient documentation

## 2010-05-10 DIAGNOSIS — I5032 Chronic diastolic (congestive) heart failure: Secondary | ICD-10-CM | POA: Insufficient documentation

## 2010-05-10 DIAGNOSIS — I5042 Chronic combined systolic (congestive) and diastolic (congestive) heart failure: Secondary | ICD-10-CM | POA: Insufficient documentation

## 2010-05-10 DIAGNOSIS — F329 Major depressive disorder, single episode, unspecified: Secondary | ICD-10-CM | POA: Insufficient documentation

## 2010-05-10 DIAGNOSIS — M4802 Spinal stenosis, cervical region: Secondary | ICD-10-CM | POA: Insufficient documentation

## 2010-05-10 DIAGNOSIS — I5030 Unspecified diastolic (congestive) heart failure: Secondary | ICD-10-CM

## 2010-05-10 DIAGNOSIS — I259 Chronic ischemic heart disease, unspecified: Secondary | ICD-10-CM

## 2010-05-10 DIAGNOSIS — E785 Hyperlipidemia, unspecified: Secondary | ICD-10-CM

## 2010-05-10 LAB — POCT INR: INR: 1.9

## 2010-05-10 LAB — CBC WITH DIFFERENTIAL/PLATELET
Basophils Absolute: 0.1 10*3/uL (ref 0.0–0.1)
Basophils Relative: 1 % (ref 0.0–3.0)
Hemoglobin: 12.4 g/dL (ref 12.0–15.0)
Lymphocytes Relative: 32.4 % (ref 12.0–46.0)
Monocytes Relative: 10.2 % (ref 3.0–12.0)
Neutro Abs: 3.6 10*3/uL (ref 1.4–7.7)
Neutrophils Relative %: 53.9 % (ref 43.0–77.0)
RBC: 4.11 Mil/uL (ref 3.87–5.11)
RDW: 15.1 % — ABNORMAL HIGH (ref 11.5–14.6)

## 2010-05-10 LAB — BASIC METABOLIC PANEL
Calcium: 9.3 mg/dL (ref 8.4–10.5)
GFR: 71.51 mL/min (ref >60.00)
Sodium: 139 mEq/L (ref 135–145)

## 2010-05-10 NOTE — Assessment & Plan Note (Signed)
In the hospital her amitriptyline was stopped but she found that her fibromyalgia was much worse since stopping the amitriptyline so she is back on them.  She is taking 2 tablets at bedtime.

## 2010-05-10 NOTE — Assessment & Plan Note (Signed)
The patient has very severe mixed dyslipidemia but is unable to take Zetia or statins.  Prior to her hospital stay she was on WelChol and we will restart that at this time.  She will take WelChol 6 tablets daily.  We will not restart her Lovaza yet

## 2010-05-10 NOTE — Assessment & Plan Note (Signed)
Her blood pressure today is not adequately controlled.  Her blood pressure is 150/80.  She has been on Diovan 40 mg daily and we will increase her Diovan to 80 mg daily.  Previously she had been taking 160 mg daily.  She is continuing to watch her dietary salt.

## 2010-05-10 NOTE — Progress Notes (Signed)
History of Present Illness: This pleasant 76 year old Caucasian woman is seen for a post hospital office visit.  She was hospitalized from 04/27/10 through 05/03/10 for dyspnea felt to be secondary to diastolic dysfunction as well as atrial fibrillation and chronic pulmonary disease.  Her ejection fraction was 55-60%.  Her atrial fibrillation was rate controlled and she has been on chronic Coumadin anticoagulation.  She has prior history of paroxysmal atrial fibrillation.  She has a history of coronary artery disease status post coronary artery bypass graft surgery in June 2000 with a left internal mammary artery to the LAD.  He also has a past history of stroke and transient ischemic attack.  She has hyperlipidemia with intolerance to statins as well as intolerance to niacin.  She also has a history of fibromyalgia and osteoporosis and osteoarthritis and is followed for that at [facility].  Current Outpatient Prescriptions  Medication Sig Dispense Refill  . alendronate (FOSAMAX) 70 MG tablet Take 70 mg by mouth every 7 (seven) days. Take with a full glass of water on an empty stomach.       [name] allopurinol (ZYLOPRIM) 300 MG tablet Take 300 mg by mouth daily.        [name] amitriptyline (ELAVIL) 100 MG tablet Take 100 mg by mouth at bedtime.        . celecoxib (CELEBREX) 200 MG capsule Take 200 mg by mouth 2 (two) times daily.        . citalopram (CELEXA) 20 MG tablet TAKE 2 TABLETS DAILY  60 tablet  11  . clopidogrel (PLAVIX) 75 MG tablet Take 75 mg by mouth daily.        . digoxin (LANOXIN) 0.125 MG tablet Take 125 mcg by mouth daily.        . famotidine (PEPCID) 20 MG tablet Take 20 mg by mouth daily.        . furosemide (LASIX) 20 MG tablet Take 20 mg by mouth 2 (two) times daily. Taking 60mg  bid       . levothyroxine (SYNTHROID, LEVOTHROID) 25 MCG tablet Take 25 mcg by mouth daily.        . metoprolol succinate (TOPROL-XL) 25 MG 24 hr tablet Take 25 mg by mouth daily.        . Multiple Vitamin (MULTIVITAMIN  PO) Take by mouth.        . potassium chloride SA (K-DUR,KLOR-CON) 20 MEQ tablet Take 20 mEq by mouth 2 (two) times daily.        [name] tiotropium (SPIRIVA) 18 MCG inhalation capsule Place 18 mcg into inhaler and inhale daily. As directed       . valsartan (DIOVAN) 160 MG tablet Take 160 mg by mouth daily.        [name] warfarin (COUMADIN) 5 MG tablet Take 5 mg by mouth daily. Taking 5 mg as directed         Allergies  Allergen Reactions  . Codeine   . Demerol   . Lipitor (Atorvastatin Calcium)   . Mevacor (Lovastatin)   . Niacin And Related   . Pravachol   . Vasotec     Patient Active Problem List  Diagnoses  . Atrial fibrillation  . Unspecified cerebral artery occlusion with cerebral infarction  . Heart failure, diastolic, due to HTN  . Dyslipidemia  . Fibromyalgia  . Insomnia due to mental condition  . Depression  . Cervical spinal stenosis  . Hypothyroidism  . Osteoarthritis  . Osteoporosis    History  Smoking status  .  Former Smoker  . Types: Cigarettes  Smokeless tobacco  . Not on file    History  Alcohol Use: Not on file    No family history on file.  Review of Systems: Constitutional: no fever chills diaphoresis or fatigue or change in weight.  Head and neck: no hearing loss, no epistaxis, no photophobia or visual disturbance. Respiratory: No cough, shortness of breath or wheezing. Cardiovascular: No chest pain peripheral edema, palpitations. Gastrointestinal: No abdominal distention, no abdominal pain, no change in bowel habits hematochezia or melena. Genitourinary: No dysuria, no frequency, no urgency, no nocturia. Musculoskeletal:No arthralgias, no back pain, no gait disturbance or myalgias. Neurological: No dizziness, no headaches, no numbness, no seizures, no syncope, no weakness, no tremors. Hematologic: No lymphadenopathy, no easy bruising. Psychiatric: No confusion, no hallucinations, no sleep disturbance.    Physical Exam: Filed Vitals:    05/10/10 1446  BP: 150/80  Pulse: 70   Is a well-developed well-nourished woman in no acute distress.  He comes in by wheelchair.  She has a leg brace on her left lower leg from a previous fracture.Pupils equal and reactive.   Extraocular Movements are full.  There is no scleral icterus.  The mouth and pharynx are normal.  The neck is supple.  The carotids reveal no bruits.  The jugular venous pressure is normal.  The thyroid is not enlarged.  There is no lymphadenopathy.  The chest reveals clear lungs without rales or rhonchi.  Heart reveals a grade 2/6 systolic ejection murmur at the base.  No diastolic murmur.  No gallop or rub.  The rhythm is irregular.The abdomen is soft and nontender. Bowel sounds are normal. The liver and spleen are not enlarged. There Are no abdominal masses. There are no bruits.The pedal pulses are good.  There is no phlebitis or edema.  There is no cyanosis or clubbing.  She has a brace on her left lower leg.  Assessment / Plan: She has been experiencing some pleuritic type left chest pain which does not sound like angina.  She did have a CT angiogram of the chest on April 28, 2010 which did not show any pulmonary embolus.  She will treat the chest pain with mild analgesics.  She will also have sublingual nitroglycerin on hand for p.r.n. Use.  For her elevated blood pressure she will increase her Diovan to 80 mg daily.  She will restart her WelChol.  Continue her present dose of amitriptyline at bedtime for fibromyalgia.  The home health nurse will check weekly protimes and we will see her in 3 weeks for followup office visit protime EKG and basal metabolic panel.  At that time we will consider possible subsequent elective cardioversion if she has not already converted on her own.

## 2010-05-10 NOTE — Assessment & Plan Note (Signed)
The patient was recently hospitalized with diastolic congestive heart failure and atrial fibrillation.  He has been on long-term Coumadin.  She has a prior history of paroxysmal atrial fibrillation.  Sometimes it has taken as many as 5 or 6 weeks before she comes back into normal rhythm.  In the hospital she was diuresed and her peripheral edema resolved.  In the hospital she had a LexiScan Myoview stress test which showed no reversible ischemia.  In the hospital her echocardiogram showed normal systolic ejection fraction and it was felt that her heart failure was secondary to diastolic dysfunction.  Her INR today is pending.

## 2010-05-10 NOTE — Assessment & Plan Note (Signed)
Her discharge hemoglobin was 10.5 hematocrit 32.2

## 2010-05-10 NOTE — Assessment & Plan Note (Signed)
The patient has not had any recent TIA symptoms. 

## 2010-05-15 ENCOUNTER — Telehealth: Payer: Self-pay | Admitting: *Deleted

## 2010-05-15 NOTE — Telephone Encounter (Signed)
Left message with husband labs ok

## 2010-05-15 NOTE — Telephone Encounter (Signed)
Advised patient of labs 

## 2010-05-15 NOTE — Telephone Encounter (Signed)
Message copied by Regis Bill on Wed May 15, 2010  9:53 AM ------      Message from: Cassell Clement      Created: Mon May 06, 2010  7:14 PM       Please report.  Labs are fine.  Potassium normal.  Kidney function normal.  Continue same medication.

## 2010-05-15 NOTE — Telephone Encounter (Signed)
Message copied by Regis Bill on Wed May 15, 2010 10:49 AM ------      Message from: Cassell Clement      Created: Sun May 12, 2010  6:47 AM       Please report:  CMET normal.  CBC normal.  Anemia has resolved. Continue present Rx.

## 2010-05-15 NOTE — Telephone Encounter (Signed)
Message copied by Regis Bill on Wed May 15, 2010 10:00 AM ------      Message from: Cassell Clement      Created: Mon May 06, 2010  7:14 PM       Please report.  Labs are fine.  Potassium normal.  Kidney function normal.  Continue same medication.

## 2010-05-16 NOTE — Telephone Encounter (Signed)
pfts have been received

## 2010-05-20 ENCOUNTER — Other Ambulatory Visit: Payer: Self-pay | Admitting: Cardiology

## 2010-05-21 ENCOUNTER — Other Ambulatory Visit: Payer: Self-pay | Admitting: *Deleted

## 2010-05-21 ENCOUNTER — Encounter: Payer: Medicare Other | Admitting: *Deleted

## 2010-05-21 DIAGNOSIS — I509 Heart failure, unspecified: Secondary | ICD-10-CM

## 2010-05-21 MED ORDER — FUROSEMIDE 20 MG PO TABS: 20.0000 mg | ORAL_TABLET | Freq: Two times a day (BID) | ORAL | Status: DC

## 2010-05-22 ENCOUNTER — Ambulatory Visit (INDEPENDENT_AMBULATORY_CARE_PROVIDER_SITE_OTHER): Payer: Medicare Other | Admitting: *Deleted

## 2010-05-22 DIAGNOSIS — Z7901 Long term (current) use of anticoagulants: Secondary | ICD-10-CM

## 2010-05-22 DIAGNOSIS — I4891 Unspecified atrial fibrillation: Secondary | ICD-10-CM

## 2010-05-27 ENCOUNTER — Other Ambulatory Visit: Payer: Self-pay | Admitting: *Deleted

## 2010-05-27 DIAGNOSIS — Z79899 Other long term (current) drug therapy: Secondary | ICD-10-CM

## 2010-05-28 ENCOUNTER — Encounter: Payer: Self-pay | Admitting: *Deleted

## 2010-05-29 ENCOUNTER — Encounter: Payer: Self-pay | Admitting: Cardiology

## 2010-05-29 ENCOUNTER — Other Ambulatory Visit (INDEPENDENT_AMBULATORY_CARE_PROVIDER_SITE_OTHER): Payer: Medicare Other | Admitting: *Deleted

## 2010-05-29 ENCOUNTER — Encounter (INDEPENDENT_AMBULATORY_CARE_PROVIDER_SITE_OTHER): Payer: Medicare Other | Admitting: *Deleted

## 2010-05-29 ENCOUNTER — Ambulatory Visit (INDEPENDENT_AMBULATORY_CARE_PROVIDER_SITE_OTHER): Payer: Medicare Other | Admitting: Cardiology

## 2010-05-29 DIAGNOSIS — I4891 Unspecified atrial fibrillation: Secondary | ICD-10-CM

## 2010-05-29 DIAGNOSIS — Z79899 Other long term (current) drug therapy: Secondary | ICD-10-CM

## 2010-05-29 DIAGNOSIS — I119 Hypertensive heart disease without heart failure: Secondary | ICD-10-CM

## 2010-05-29 DIAGNOSIS — I130 Hypertensive heart and chronic kidney disease with heart failure and stage 1 through stage 4 chronic kidney disease, or unspecified chronic kidney disease: Secondary | ICD-10-CM | POA: Insufficient documentation

## 2010-05-29 DIAGNOSIS — I110 Hypertensive heart disease with heart failure: Secondary | ICD-10-CM

## 2010-05-29 DIAGNOSIS — I5030 Unspecified diastolic (congestive) heart failure: Secondary | ICD-10-CM

## 2010-05-29 LAB — POCT INR: INR: 2.4

## 2010-05-29 MED ORDER — AMLODIPINE BESYLATE 5 MG PO TABS: 5.0000 mg | ORAL_TABLET | Freq: Every day | ORAL | Status: DC

## 2010-05-29 MED ORDER — FUROSEMIDE 40 MG PO TABS: 40.0000 mg | ORAL_TABLET | Freq: Three times a day (TID) | ORAL | Status: DC

## 2010-05-29 NOTE — Assessment & Plan Note (Signed)
We went over her medications in great detail.  It turns out she's been on furosemide 40 mg 3 times a day.  We Drew a basal metabolic panel today and we will keep her on her present dose of furosemide.

## 2010-05-29 NOTE — Assessment & Plan Note (Signed)
The patient's blood pressure at home has been high sometimes as high as 190 systolic.  Today her systolic pressure is 170.  Her blood pressure has not been as well controlled since she was taken off the amlodipine while in the hospital.  We will restart amlodipine 5 mg daily today.

## 2010-05-29 NOTE — Progress Notes (Signed)
Amber [name] Date of Birth:  [birth year] [facility] [address]   [address] [place], [place]  [address] [phone]           Fax   [phone]  History of Present Illness: This 76 year old woman is seen for a scheduled followup office visit.  She has a history of ischemic heart disease and is status post CABG.  She also has a history of an old stroke.  She's had a past history of intermittent atrial fibrillation and recently was hospitalized with congestive heart failure and recurrent atrial fibrillation.  She has been on long-term Coumadin.  Her recent Coumadin 7 subtherapeutic and we are anticipating an attempt at electrical cardioversion once she has been therapeutic for 4 weeks.  She denies any chest pain.  Her systolic blood pressure has been elevated at home and she has not been on amlodipine since her recent hospital stay.  She has not been experiencing any chest pain or angina.  She has not been expressing any recent pedal edema or orthopnea.  Current Outpatient Prescriptions  Medication Sig Dispense Refill  . alendronate (FOSAMAX) 70 MG tablet Take 70 mg by mouth every 7 (seven) days. Take with a full glass of water on an empty stomach.       [name] allopurinol (ZYLOPRIM) 300 MG tablet Take 300 mg by mouth daily.        [name] amitriptyline (ELAVIL) 100 MG tablet Take 100 mg by mouth at bedtime.        . celecoxib (CELEBREX) 200 MG capsule Take 200 mg by mouth 2 (two) times daily.        . citalopram (CELEXA) 20 MG tablet TAKE 2 TABLETS DAILY  60 tablet  11  . clopidogrel (PLAVIX) 75 MG tablet Take 75 mg by mouth daily.        . digoxin (LANOXIN) 0.125 MG tablet Take 125 mcg by mouth daily.        . famotidine (PEPCID) 20 MG tablet Take 20 mg by mouth daily.        [name] levothyroxine (SYNTHROID, LEVOTHROID) 25 MCG tablet Take 25 mcg by mouth daily.        . metoprolol succinate (TOPROL-XL) 25 MG 24 hr tablet Take 25 mg by mouth daily.        . Multiple Vitamin  (MULTIVITAMIN PO) Take by mouth.        . potassium chloride SA (K-DUR,KLOR-CON) 20 MEQ tablet Take 20 mEq by mouth 2 (two) times daily.        [name] tiotropium (SPIRIVA) 18 MCG inhalation capsule Place 18 mcg into inhaler and inhale daily. As directed       . valsartan (DIOVAN) 160 MG tablet Take 160 mg by mouth daily.        [name] warfarin (COUMADIN) 5 MG tablet Take 5 mg by mouth daily. Taking 5 mg as directed       . DISCONTD: furosemide (LASIX) 20 MG tablet Take 1 tablet (20 mg total) by mouth 2 (two) times daily. Taking 60mg  bid  90 tablet  3  . DISCONTD: furosemide (LASIX) 20 MG tablet Take 20 mg by mouth 2 (two) times daily. Taking 120 mg tid       . amLODipine (NORVASC) 5 MG tablet Take 1 tablet (5 mg total) by mouth daily.  30 tablet  11  . furosemide (LASIX) 40 MG tablet Take 1 tablet (40 mg total) by mouth 3 (three) times daily.  90 tablet  11    Allergies  Allergen Reactions  . Codeine   . Demerol   . Lipitor (Atorvastatin Calcium)   . Mevacor (Lovastatin)   . Niacin And Related   . Pravachol   . Vasotec     Patient Active Problem List  Diagnoses  . Atrial fibrillation  . Unspecified cerebral artery occlusion with cerebral infarction  . Heart failure, diastolic, due to HTN  . Dyslipidemia  . Fibromyalgia  . Insomnia due to mental condition  . Depression  . Cervical spinal stenosis  . Hypothyroidism  . Osteoarthritis  . Osteoporosis  . Anemia  . Ischemic heart disease  . S/P CABG (coronary artery bypass graft)  . Benign hypertensive heart disease without heart failure    History  Smoking status  . Former Smoker  . Types: Cigarettes  Smokeless tobacco  . Not on file    History  Alcohol Use: Not on file    No family history on file.  Review of Systems: Constitutional: no fever chills diaphoresis or fatigue or change in weight.  Head and neck: no hearing loss, no epistaxis, no photophobia or visual disturbance. Respiratory: No cough, shortness of breath or  wheezing. Cardiovascular: No chest pain peripheral edema, palpitations. Gastrointestinal: No abdominal distention, no abdominal pain, no change in bowel habits hematochezia or melena. Genitourinary: No dysuria, no frequency, no urgency, no nocturia. Musculoskeletal:No arthralgias, no back pain, no gait disturbance or myalgias. Neurological: No dizziness, no headaches, no numbness, no seizures, no syncope, no weakness, no tremors. Hematologic: No lymphadenopathy, no easy bruising. Psychiatric: No confusion, no hallucinations, no sleep disturbance.    Physical Exam: Filed Vitals:   05/29/10 1628  BP: 170/80  Pulse: 62   the general appearance reveals a well-developed well-nourished woman in no distress.Pupils equal and reactive.   Extraocular Movements are full.  There is no scleral icterus.  The mouth and pharynx are normal.  The neck is supple.  The carotids reveal no bruits.  The jugular venous pressure is normal.  The thyroid is not enlarged.  There is no lymphadenopathy.The chest is clear to percussion and auscultation. There are no rales or rhonchi. Expansion of the chest is symmetrical.  Heart reveals a grade 2/6 systolic murmur at the base.  No gallop or rub.  The breasts reveal no masses.  The abdomen is soft and nontender without hepatosplenomegaly or masses.    Extremities reveal no phlebitis or edema.  she is wearing a walking boot on her left foot. Neurologic exam reveals residual  Mild hemiparesis from her previous stroke. Assessment / Plan:   Continue same Coumadin.  Her INR is 2.4.  Continue same furosemide.  Restart amlodipine 5 mg one daily.  Check a protime weekly and we will see her in 4 weeks for followup office visit and protime and consider outpatient cardioversion if she remains therapeutic.

## 2010-05-29 NOTE — Assessment & Plan Note (Signed)
The patient remains in atrial fibrillation.  She has not had any thromboembolic symptoms.  Her INR today is therapeutic at 2.4.  Last week it was subtherapeutic.  We'll want her to have therapeutic INRs for 4 weeks before we would consider elective  Cardioversion.

## 2010-05-30 ENCOUNTER — Encounter: Payer: Self-pay | Admitting: Cardiology

## 2010-05-30 LAB — BASIC METABOLIC PANEL
BUN: 15 mg/dL (ref 6–23)
Chloride: 103 mEq/L (ref 96–112)
Potassium: 4 mEq/L (ref 3.5–5.1)
Sodium: 142 mEq/L (ref 135–145)

## 2010-05-31 ENCOUNTER — Encounter: Payer: Self-pay | Admitting: Pulmonary Disease

## 2010-06-04 ENCOUNTER — Encounter: Payer: Self-pay | Admitting: Pulmonary Disease

## 2010-06-04 ENCOUNTER — Ambulatory Visit (INDEPENDENT_AMBULATORY_CARE_PROVIDER_SITE_OTHER): Payer: Medicare Other | Admitting: Pulmonary Disease

## 2010-06-04 ENCOUNTER — Telehealth: Payer: Self-pay | Admitting: *Deleted

## 2010-06-04 VITALS — BP 134/60 | HR 100 | Temp 98.0°F | Ht 64.0 in | Wt 164.0 lb

## 2010-06-04 DIAGNOSIS — J449 Chronic obstructive pulmonary disease, unspecified: Secondary | ICD-10-CM | POA: Insufficient documentation

## 2010-06-04 MED ORDER — TIOTROPIUM BROMIDE MONOHYDRATE 18 MCG IN CAPS: ORAL_CAPSULE | RESPIRATORY_TRACT | Status: DC

## 2010-06-04 NOTE — Progress Notes (Signed)
Subjective:    Patient ID: Amber [name], female    DOB: Jul 25, 1934, 76 y.o.   MRN: [id number]  HPI 76 yo female with COPD.  She has been doing well since hospital discharge.  Her breathing has improved.  She gets occasional cough and hoarseness.  She is not bringing up sputum.  She denies chest pain, fever, hemoptysis, wheeze, or leg swelling.  She continues with spiriva, but is not sure how much this helps.  She is concerned about how much spiriva costs.  Past Medical History  Diagnosis Date  . Coronary artery disease   . Atrial fibrillation   . Hypertension   . CVA (cerebral vascular accident)   . Hyperlipidemia   . Hypothyroidism   . Depression   . Insomnia   . Fibromyalgia   . Gout   . Osteoarthritis   . Osteoporosis   . Cervical spinal stenosis   . Nasal polyposis   . Pneumonia     1990  . COPD with emphysema     PFT 05/02/10>>FEV1 1.35(62%), FEV1% 66, DLCO 75%     History reviewed. No pertinent family history.   History   Social History  . Marital Status: Married    Spouse Name: N/A    Number of Children: N/A  . Years of Education: N/A   Occupational History  . [employer]   . Secretary     Retired   Social History Main Topics  . Smoking status: Former Smoker -- 1.5 packs/day for 25 years    Types: Cigarettes    Quit date: 02/10/1977  . Smokeless tobacco: Never Used  . Alcohol Use: No  . Drug Use: No  . Sexually Active: Not on file   Other Topics Concern  . Not on file   Social History Narrative  . No narrative on file     Allergies  Allergen Reactions  . Lipitor (Atorvastatin Calcium)   . Mevacor (Lovastatin)   . Pravachol   . Vasotec      Outpatient Prescriptions Prior to Visit  Medication Sig Dispense Refill  . alendronate (FOSAMAX) 70 MG tablet Take 70 mg by mouth every 7 (seven) days. Take with a full glass of water on an empty stomach.       [name] allopurinol (ZYLOPRIM) 300 MG tablet Take 300 mg by mouth daily.        [name]  amitriptyline (ELAVIL) 100 MG tablet Take 50 mg by mouth at bedtime.       [name] amLODipine (NORVASC) 5 MG tablet Take 1 tablet (5 mg total) by mouth daily.  30 tablet  11  . celecoxib (CELEBREX) 200 MG capsule Take 200 mg by mouth 2 (two) times daily.        . citalopram (CELEXA) 20 MG tablet TAKE 2 TABLETS DAILY  60 tablet  11  . clopidogrel (PLAVIX) 75 MG tablet Take 75 mg by mouth daily.        . digoxin (LANOXIN) 0.125 MG tablet Take 125 mcg by mouth daily.        . famotidine (PEPCID) 20 MG tablet Take 20 mg by mouth daily.        . furosemide (LASIX) 40 MG tablet Take 1 tablet (40 mg total) by mouth 3 (three) times daily.  90 tablet  11  . levothyroxine (SYNTHROID, LEVOTHROID) 25 MCG tablet Take 25 mcg by mouth daily.        . metoprolol succinate (TOPROL-XL) 25 MG 24 hr tablet Take 25  mg by mouth daily.        . Multiple Vitamin (MULTIVITAMIN PO) Take by mouth.        . potassium chloride SA (K-DUR,KLOR-CON) 20 MEQ tablet Take 20 mEq by mouth 2 (two) times daily.        . valsartan (DIOVAN) 160 MG tablet Take 160 mg by mouth daily.        [name] warfarin (COUMADIN) 5 MG tablet Take 5 mg by mouth daily. Taking 7.5 mg x 1 day 5 mg x 6 days      . tiotropium (SPIRIVA) 18 MCG inhalation capsule Place 18 mcg into inhaler and inhale daily. As directed           Review of Systems     Objective:   Physical Exam Filed Vitals:   06/04/10 1135 06/04/10 1136  BP:  134/60  Pulse:  100  Temp: 98 F (36.7 C)   TempSrc: Oral   Height: 5\' 4"  (1.626 m)   Weight: 164 lb (74.39 kg)   SpO2:  94%   General - Thin, healthy, no distress HEENT - no sinus tenderness, no oral exudate, no LAN Cardiac - irregular, no murmur Chest - prolonged exhalation, no wheeze/rales Abd - soft, non-tender Ext - no edema Neuro - normal strength, CN intact, A & O x 3 Psych - normal mood and behavior        Assessment & Plan:

## 2010-06-04 NOTE — Patient Instructions (Signed)
Spiriva one puff every other day for two weeks.  If okay, then stop spiriva Follow up in 4 months

## 2010-06-04 NOTE — Assessment & Plan Note (Signed)
She has improved since hospital discharge.  I have advised her to slowly taper her spiriva as tolerated to determine her inhaler needs.

## 2010-06-04 NOTE — Telephone Encounter (Signed)
Advised patient of labs 

## 2010-06-04 NOTE — Telephone Encounter (Signed)
Message copied by Regis Bill on Tue Jun 04, 2010  1:31 PM ------      Message from: Cassell Clement      Created: Fri May 31, 2010  7:21 AM       The kidney tests and potassium are good.  Sugar is satisfactory at 100.  Continue same medicines

## 2010-06-05 ENCOUNTER — Ambulatory Visit (INDEPENDENT_AMBULATORY_CARE_PROVIDER_SITE_OTHER): Payer: Medicare Other | Admitting: *Deleted

## 2010-06-05 DIAGNOSIS — I4891 Unspecified atrial fibrillation: Secondary | ICD-10-CM

## 2010-06-05 LAB — POCT INR: INR: 1.9

## 2010-06-07 NOTE — Consult Note (Signed)
NAMECAMIE, Amber [name]                ACCOUNT NO.:  [id number]  MEDICAL RECORD NO.:  [id number]           PATIENT TYPE:  I  LOCATION:  4733                         FACILITY:  [facility]  PHYSICIAN:  Rollene Rotunda, MD, FACCDATE OF BIRTH:  [birth year]  DATE OF CONSULTATION:  04/29/2010 DATE OF DISCHARGE:                                CONSULTATION   PRIMARY CARE PHYSICIAN AND PRIMARY CARDIOLOGIST:  Cassell Clement, MD  CHIEF COMPLAINT:  Chest pain with a history of coronary artery disease.  HISTORY OF PRESENT ILLNESS:  Amber [name] is a 76 year old female with a history of coronary artery disease.  She has a long history of lower left-sided chest pain that radiates through to her back.  She gets episodes once or twice a day.  It reaches to 4/10.  It is not clearly exertional.  She has treated it with Tums in the past successfully although she does not treat it very often.  There is no relation to meals or position.  There is no change with deep inspiration.  Amber [name] also has a history of paroxysmal atrial fibrillation.  She was in AFib during an office visit in February.  The patient feels that she has been in and out of AFib since then; however, she also feels that she has been in and out of AFib during this hospitalization when she has been in AFib continuously.  She has noted low heart rates which she checks on her home blood pressure machine.  She has also noted increasing lower extremity edema.  The family comments that she seems to be sleeping more.  They also state that her activity level has deteriorated significantly and now she has trouble walking room to room. She complains of presyncope as well as shortness of breath with exertion.  Orthostatics have not been checked.  She falls regularly and generally catches herself on furniture or a door frame but has also fallen and within the last 2 weeks broke her lower left leg.  Her left foot is chronically inverted because of a history  of CVA.  This influences her balance as well.  The patient has been on Coumadin and has a history of TIA and CVA.  She is reluctant to come off Coumadin because she fears she will have another stroke if she does.  Amber [name] came to the hospital on April 27, 2010, because of acute shortness of breath.  Her shortness of breath has improved with IV Lasix.  She was initially given 40 mg IV in the emergency room and then was on 40 mg IV b.i.d.  She was changed to 40 mg p.o. daily which is less than her home dose of 60 mg b.i.d.  However, she was evaluated by her attendings and felt to be still volume overloaded as well as having hypoxia with ambulation.  She was changed back to IV Lasix at 60 mg IV b.i.d.  Amber [name] feels that since admission she has lost a lot of fluid and her respiratory status has improved, but she still does get short of breath with ambulation or exertion.  PAST MEDICAL HISTORY: 1.  Status post aortocoronary bypass surgery in June 2000 with LIMA to     LAD. 2. Mild left ventricular dysfunction with an EF of 50% by     echocardiogram in 2009. 3. Hyperlipidemia. 4. Hypertension. 5. Paroxysmal atrial fibrillation. 6. Chronic anticoagulation with Coumadin. 7. History of CVA and TIA. 8. Cervical spine stenosis. 9. Insomnia. 10.Gout as well as fibromyalgia and rheumatoid arthritis. 11.Family history of coronary artery disease. 12.Depression.  PAST SURGICAL HISTORY:  She is status post cardiac catheterization and bypass surgery.  ALLERGIES:  She has been intolerant to STATINS with elevated CKs.  She is also allergic or intolerant to CECLOR and MORPHINE.  SOCIAL HISTORY:  She lives in [place], [place], with family. She quit tobacco more than 30 years ago.  She has no history of alcohol or drug abuse.  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  FAMILY HISTORY:  Her mother died at 73 with a  stroke which was actually an aneurysmal bleed.  Her father died of heart failure and several siblings have coronary artery disease.  REVIEW OF SYSTEMS:  The patient complains of dyspnea on exertion and possibly orthopnea but denies PND.  She was wheezing just prior to admission, but this is not normal for her.  She has had problems with lower extremity edema recently.  She is chronically weak and has problems with left-sided weakness.  She has chronic joint pains and arthralgias that are improved by the Celebrex.  She has not had any recent fevers or chills.  She denies dysuria.  The palpitations and shortness of breath are described above.  Full 14-point review of systems is, otherwise, negative except as stated in the HPI.  PHYSICAL EXAMINATION:  VITAL SIGNS:  Temperature is 98.4, blood pressure 124/57, pulse 75, respiratory rate 16, O2 saturation 95% on room air but decreased to 89% with ambulation. GENERAL:  She is a well-developed elderly white female, in no acute distress at rest. HEENT:  Normal for age. NECK:  There is no lymphadenopathy, thyromegaly, or bruits noted.  She has minimal JVD at less than 10 cm. CARDIOVASCULAR:  Her heart is slightly irregular in rate and rhythm with an S1 and S2 and a systolic murmur is noted.  Distal pulses are intact in all four extremities but slightly decreased in the lower extremities. LUNGS:  Decreased breath sounds in the bases but no wheezing or crackles are noted. SKIN:  She has a large area of ecchymosis on the posterior left calf and thigh.  There are no rashes noted. ABDOMEN:  Soft and nontender with active bowel sounds. EXTREMITIES:  There is no cyanosis or clubbing noted and only trace edema. MUSCULOSKELETAL:  There is no joint deformity or effusions and no spine or CVA tenderness. NEUROLOGIC:  She is alert and oriented with cranial nerves II through XII grossly intact and chronic left-sided weakness.  LABORATORY VALUES:   Hemoglobin 9.9, hematocrit 30.3, WBCs 9.2, platelets 247.  INR is 2.11.  Sodium 137, potassium 3.9, chloride 104, CO2 is 27, BUN 13, creatinine 0.83, glucose 125.  Total cholesterol 235, triglycerides 177, HDL 39, LDL 161.  Iron 38, TIBC 245, 16% saturation. Digoxin level 0.5.  CK-MB:  #1, 360/11.7; then 315/10.8; then 298/7.8; then 321/7.3 with troponin I of 0.03, then 0.01, then 0.01, and 0.02. BNP on admission 501 and not recheck.  TSH within normal limits at 3.214.  Urine drug screen negative.  Urinalysis showing few bacteria and moderate leukocytes.  Urine culture with 60,000 colonies of Gram- negative rods and blood cultures are pending.  CURRENT MEDICATIONS: 1. Rocephin 2 g IV q.24 h. 2. Celexa 20 mg a day. 3. Plavix 75 mg a day. 4. Lasix 60 mg IV b.i.d. 5. Synthroid 25 mcg daily. 6. Toprol-XL 25 mg a day. 7. Benicar 2.5 mg b.i.d. 8. Crestor 20 mg weekly. 9. Senokot 2 tablets nightly. 10.Coumadin.  Chest x-ray:  Right lung base densities are suggestive for atelectasis and pleural fluid and there may be a small amount of interstitial edema.  CT angiogram of the chest showing no pulmonary embolus, CHF noted with interstitial pulmonary edema, and small areas of bronchopneumonia not excluded.  Emphysema is noted.  A 2-D echocardiogram is pending.  EKG:  Atrial fibrillation with a controlled ventricular rate and minimal inferolateral ST changes from an EKG dated 2009.  IMPRESSION:  Ms. Blecher was seen today by Dr. Antoine Poche, the patient was evaluated and the data reviewed.  Ms. Vitrano presents with acute dyspnea. The objective evidence includes mild edema and an elevated BNP although we do not have a baseline.  Her EKG shows atrial fibrillation and she has had paroxysms of this, but it is possibly permanent now.  She thinks she goes back and forth, but this may not be accurate.  She has had no clear anginal chest pain.  Her symptoms improved with IV Lasix. However, she still  dropped her sats this a.m. with ambulation and she reports dyspnea off oxygen.  Preliminary reports on her echo indicate normal left ventricular systolic function.  PLAN:  Acute dyspnea:  Dr. Antoine Poche discussed the situation with Dr. Patty Sermons.  She needs an ischemic workup.  We will plan on noninvasive strategy at this time.  She has been appropriately continued on IV diuresis.  We will continue to follow her sats with ambulation.  A pharmacologic stress test has been ordered for Wednesday.  We will also discontinue the statin as she does not want this, and it may interfere with assessment of her baseline status.  We will continue to follow her closely with you.     Theodore Demark, PA-C   ______________________________ Rollene Rotunda, MD, [facility]    RB/MEDQ  D:  04/29/2010  T:  04/30/2010  Job:  [id number]  Electronically Signed by Theodore Demark PA-C on 04/30/2010 04:52:46 PM Electronically Signed by Rollene Rotunda MD [facility] on 06/07/2010 11:43:34 AM

## 2010-06-07 NOTE — Discharge Summary (Signed)
Amber [name], Amber [name]                ACCOUNT NO.:  [id number]  MEDICAL RECORD NO.:  [id number]           PATIENT TYPE:  I  LOCATION:  4733                         FACILITY:  [facility]  PHYSICIAN:  Amber Rotunda, MD, FACCDATE OF BIRTH:  1934/08/27  DATE OF ADMISSION:  04/27/2010 DATE OF DISCHARGE:  05/03/2010                              DISCHARGE SUMMARY   PRIMARY CARDIOLOGIST:  Amber Clement, MD  DISCHARGE DIAGNOSES: 1. Dyspnea, resolved.     a.     Felt to be multifactorial and secondary to diastolic      dysfunction, atrial fibrillation, and chronic obstructive      pulmonary disease. 2. Diastolic dysfunction, ejection fraction 55-60%. 3. Rate controlled on chronic anticoagulation. 4. Chronic obstructive pulmonary disease.  SECONDARY DIAGNOSES: 1. Coronary artery disease status post coronary artery bypass grafting     in June 2000 with left internal mammary artery to the left anterior     descending. 2. Hyperlipidemia. 3. Hypertension., 4. History of transient ischemic attacks. 5. Insomnia. 6. Cervical spine stenosis.  ALLERGIES:  Intolerance to STATINS as well as CECLOR and MORPHINE.  PROCEDURES/DIAGNOSTICS PERFORMED DURING HOSPITALIZATION: 1. Amber [name] on May 01, 2010:  Normal pharmacologic stress     nuclear scan.  No scar ischemia.  Normal wall motion.  Ejection     fraction greater than 70%. 2. CT angiography of chest on April 28, 2010:  Without pulmonary     embolus.  Congestive heart failure was noted.  Small areas of     patchy airspace disease, probably represent valvular edema in the     setting of congestive heart failure, although small areas of     bronchopneumonia cannot be excluded.  Lymphedema. 3. Chest x-ray, April 27, 2010:  Right lung base densities suggestive     for atelectasis and pleural fluid.  Small amount of interstitial     edema.  Repeat x-ray on May 03, 2010 demonstrating hyperinflation     suggestive of chronic obstructive  pulmonary disease.  Small left     effusion.  No evidence of infiltrate.  No evidence of pulmonary     edema. 4. Echo on April 29, 2010:  Left ventricle with normal systolic     function, ejection fraction between 55-60%.  No wall motion     abnormality.  Left atrium was mildly dilated.  Systolic pressure     was mildly increased with a PA peak pressure of 44 mmHg.  REASON FOR ADMISSION:  This is a 76 year old female with the above- stated problem list who presented to the [facility] emergency department on April 27, 2010 secondary to progressive shortness of breath over the past 7 days.  Dyspnea initially started with exertion but on the morning of admission she woke up dyspneic.  She further complained of chest discomfort which was worse with exertion and relieved with rest.  She also complains of generalized fatigue.  She was admitted by the Internal Medicine Service.  The patient was treated for congestive heart failure and placed on IV Lasix.  During initial evaluation, the patient was also found to have UTI and was  therefore started on antibiotics, a full course has been completed.  HOSPITAL COURSE:  The patient was admitted to the Telemetry Unit.  Her shortness of breath did improve with IV Lasix.  She was initially given 40 mg IV in the emergency department and then transitioned to 40 mg IV twice daily.  The patient's initial BNP was 501.  Followup BNP on the day of discharge was 189.  Initial weight was 77 kg with a discharge weight of 69.7 kg.  The patient's volume status was followed daily.  Her dyspnea had improved and on May 01, 2010, she was transitioned to oral Lasix, back to her home dose of 60 mg twice daily.  The patient's O2 saturation was monitored closely.  It was noted that with ambulation saturations would fall as low as 86%.  Although the patient's diastolic dysfunction had been improved with IV Lasix, her symptoms of dyspnea and oxygen desaturation with exertion  did not improve.  Therefore, a Pulmonary consultation was requested.  Dr. Craige [name] ordered Spiriva empirically and it was felt that depending on her pulmonary status she will be reassessed for further adjustments.  Pulmonary function tests were also obtained that demonstrated airway obstruction with reduced FVC, FEV-1, FEV-1/FVC ratio, and FEF 25-75%.  The patient will follow up with Dr. Craige [name] as an outpatient.  A echocardiogram was obtained.  This showed no systolic dysfunction, but it did represent diastolic dysfunction.  The patient's ejection fraction was noted to be normal.  With her episodes of chest pain and after much discussion with her primary cardiologist, Dr. Patty [name], it was decided that patient would have further ischemic evaluation.  At this time, the patient underwent Lexiscan Myoview.  The results demonstrated no ischemia or infarction.  Therefore, no further ischemic evaluation was pursued.  Initially, it was felt that the patient would be taken for a right heart catheterization as her hypoxemia did not improve.  This was held as the patient's dyspnea did improve with the addition of Spiriva and therefore no indication for heart catheterization.  This has been discussed with the family and the patient and they voiced understanding. The patient's Coumadin was held in preparation for possible right heart catheterization, the patient's INR did go up to 1.92 and she was given a full dose of Lovenox bridging with Coumadin.  This will be rechecked on Monday in the office.  With the patient's oxygen desaturation with ambulation, it was decided that she would need home oxygen.  This has been ordered.  The patient will also go home with Home Health as well as PT.  Discussion on weight management importance was discussed with the patient and her family and they voiced understanding.  Discharge plans and instructions have also been discussed with Dr. Patty [name] and he is in  agreement.  On the day of discharge, Dr. Antoine [name] evaluated the patient and noted her to be stable for home with close outpatient followup.  Her breathing was much improved, although she was still mildly dyspneic with ambulation.  Again, the patient will be discharged with home oxygen.  DISCHARGE LABORATORY DATA:  WBC 6.1, hemoglobin 10.5, hematocrit 32.2, and platelets 310.  INR 1.92.  Sodium 137, potassium 4, BUN 10, and creatinine 0.69.  BNP 189.  DISCHARGE MEDICATIONS: 1. Toprol-XL 25 mg daily. 2. Spiriva 80 mcg 1 capsule inhaled daily. 3. Potassium chloride 20 mEq 2 tablets daily. 4. Allopurinol 300 mg 1 tablet daily. 5. Celebrex 200 mg 1 capsule twice daily. 6. Citalopram 20 mg twice daily.  7. Colchicine 0.6 mg 1 tablet daily as needed for gout. 8. Coumadin 5 mg, the patient will take 1/2 tablet tonight and resume     her 1 tablet every day except for 1-1/2 tablets on Sunday. 9. Diovan 40 mg 1 tablet daily. 10.Famotidine 20 mg 1 tablet daily as needed for indigestion. 11.Furosemide 20 mg twice daily to be taken with 40 mg tablet twice     daily to equal 60 mg twice daily. 12.Digoxin 0.125 mg every evening. 13.Plavix 75 mg daily. 14.Synthroid 25 mcg daily. 15.Please stop taking amlodipine 10 mg daily.  DISCHARGE PLANS AND INSTRUCTIONS: 1. The patient will follow up with lab work on May 06, 2010 at     11 :35, this will include an INR and BMET. 2. The patient will follow up with Dr. Patty [name] on May 10, 2010 at     2:15 for reevaluation of volume status. 3. The patient is to increase activity slowly. 4. The patient is to continue low-sodium heart-healthy diet. 5. The patient is to stop any activity that causes chest pain or     shortness of breath. 6. The patient is to call our office in the interim for any questions     or concerns.  DURATION OF DISCHARGE:  Greater than 30 minutes with physician and physician extended time.     Leonette Monarch,  PA-C   ______________________________ Amber Rotunda, MD, [facility]    NB/MEDQ  D:  05/03/2010  T:  05/04/2010  Job:  [id number]  cc:   Amber [name], M.D.  Electronically Signed by Alen Blew P.A. on 05/10/2010 43:32:95 PM Electronically Signed by Amber Rotunda MD [facility] on 06/07/2010 11:43:36 AM

## 2010-06-10 ENCOUNTER — Encounter: Payer: Self-pay | Admitting: Pulmonary Disease

## 2010-06-11 ENCOUNTER — Telehealth: Payer: Self-pay | Admitting: *Deleted

## 2010-06-11 ENCOUNTER — Telehealth: Payer: Self-pay | Admitting: Cardiology

## 2010-06-11 NOTE — Progress Notes (Signed)
Advised patient of labs 

## 2010-06-11 NOTE — Telephone Encounter (Signed)
Message copied by Regis Bill on Tue Jun 11, 2010 11:21 AM ------      Message from: Cassell Clement      Created: Fri May 31, 2010  7:21 AM       The kidney tests and potassium are good.  Sugar is satisfactory at 100.  Continue same medicines

## 2010-06-11 NOTE — Telephone Encounter (Signed)
Has been performing physical therapy for pt per hos.d/c orders and wants to know if she can get another order from Dr.Brackbill to continue the PT.

## 2010-06-11 NOTE — Telephone Encounter (Signed)
Advised patient of labs 

## 2010-06-12 ENCOUNTER — Ambulatory Visit (INDEPENDENT_AMBULATORY_CARE_PROVIDER_SITE_OTHER): Payer: Medicare Other | Admitting: *Deleted

## 2010-06-12 DIAGNOSIS — I4891 Unspecified atrial fibrillation: Secondary | ICD-10-CM

## 2010-06-12 NOTE — Telephone Encounter (Signed)
Amber [name] ok to continue per Dr. Patty Sermons

## 2010-06-16 ENCOUNTER — Encounter: Payer: Self-pay | Admitting: Cardiology

## 2010-06-19 ENCOUNTER — Encounter: Payer: Medicare Other | Admitting: *Deleted

## 2010-06-25 NOTE — H&P (Signed)
Amber [name], HOERNER NO.:  [id number]   MEDICAL RECORD NO.:  [id number]          PATIENT TYPE:  EMS   LOCATION:  MAJO                         FACILITY:  [facility]   PHYSICIAN:  Melvyn Novas, M.D.  DATE OF BIRTH:  September 06, 1934   DATE OF ADMISSION:  12/06/2007  DATE OF DISCHARGE:                              HISTORY & PHYSICAL   Amber [name] is a 76 year old Caucasian right-handed female patient of  Dr. Patty Sermons. She was seen on December 06, 2007, at 6:30 p.m. in a ER  consultation requested by Dr. Dione Booze.  The patient presented to the  ER around 6:00 p.m. and a code stroke was called.  The rapid response  nurse immediately evaluated the patient, who came with a chief complaint  of left-sided facial numbness, a puffiness that she describes to have  felt over her left face and neck, and left temple pain.  She denies any  nausea, vision changes, grip strength changes, or physical changes.  No  balance problems in addition to those she already had prior.  The  patient's past medical and surgical history is positive for stroke.  She  states that she had lacunar strokes and TIAs in the past, then about 7  or 8 years ago was seen with a stroke that left her with a left-sided  hemiparesis and she was rehabbed here at [facility] until she was able  to ambulate again, but never quite gained all her balance back.  She has  no visible facial droop at this time, but immediately evaluating ER  physician, Dr. Ellin Goodie had noted a left-sided facial droop upon arrival.  This has now resolved.  The NIH stroke scale was now zero points  obtained by April, RN, of the rapid response team.  A CT shows no bleed,  some white matter disease.  Risk factors for stroke include atrial  fibrillation, hypertension, hyperlipidemia, and prior stroke.  The  atrial fibrillation, the patient states was diagnosed after she had her  stroke and was not felt to have been the cause for her previous  stroke.  The patient has only a partial past medical history available.  She said  that she has hypothyroidism and hypertension.  She has been on high  doses of Lasix 40 mg t.i.d. because of congestion which I suppose means  congestive heart failure.  She is for rheumatoid arthritis on Celebrex  she states.  She has been on Elavil at night to help with bladder tone  as well as the sleep.  For the atrial fibrillation rate control, she has  been on Lanoxin.  She had to be placed on K-Dur because of the high dose  of Lasix she receives.  She has suffered from depression and is treated  with citalopram.   MEDICATIONS:  1. WelChol.  2. Atacand 32 mg at night.  3. K-Dur 20 mEq b.i.d.  4. Citalopram 20 mg q.a.m.  5. Synthroid 25 mcg daily.  6. Norvasc 10 mg q.a.m.  7. Atenolol 50 mg b.i.d. p.o.  8. Lasix 40 mg t.i.d. p.o., last dose at  5:00 p.m.  9. Zetia 10 mg in the morning.  10.Celebrex 200 mg twice a day.  11.Elavil 75 mg at night.  12.Lanoxin 125 mcg at night.  13.The patient is also on Coumadin and she takes 7.5 mg alternating      with 5 mg.  14.She is on Plavix 75 mg daily.  15.Baby aspirin daily, she states.   Again, medical management is arranged through Dr. Yevonne Pax office.   ALLERGIES:  The patient is allergic to  __________.   FAMILY HISTORY:  The patient's mother died at 53 of a second stroke  which actually was an aneurysm bleed.  Her father died of heart failure.  The patient had several brothers with coronary artery diseases, 1  underwent CABG, 1 sister is healthy, 2 others have diabetes.   The patient's daughter is healthy.  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   The patient states that she has a history of smoking over 30 years ago,  is widowed, is a nondrinker, and nonuser of illicit drugs.   REVIEW OF SYSTEMS:  LUNGS:  Clear to auscultation.  A slight elevation  in left-sided muscle tone which has been a residual from her  previous  stroke.  Normal bowel sounds.  No clubbing or cyanosis.  No  incontinence.  The patient is menopausal, but was still on Premarin as  of today, a medication we will discontinue if tolerated.  The patient  states that she still has menopausal hot flashes.  She suffers from  depression but has been treated with citalopram and feels that it has  lifted her mood.  She was afebrile, not nauseated.  Did not complain of  palpitations, tinnitus, just a mild throbbing in the left temporal and  facial numbness.   Neurologic examination shows intact cranial nerves with full visual  fields to bilateral simultaneous  stimulation.  Pupillary reaction is  intact.  The patient's tongue and uvula are midline.  Facial numbness is  a subjective finding.  The patient confirmed this extends behind into  the retroauricular region on the left side and slightly down the neck  and beyond the hairline, but does not involve nuchal tissue.   The patient's neck shows a normal range of motion, is supple.  Shoulder  shrug is slightly weaker on the left.  Grip strength is mildly weaker on  the left.  The patient's balance was not checked, but observed by the ER  physician as at baseline showing a mild circumferential gait with the  left leg, it has an elevated muscle tone.  The patient did not have a  drift in any of her 4 extremities upon testing for the NIH stroke scale.  No dysarthria.  No arthralgia.  The NIH stroke scale again was 0.  Review of preexisting medical records was delayed as those have not  reached the ER yet.  I have no access to eChart from the 2 computers  available for my use here and I have not yet received the patient's  previous admission paper charts from 7 or 8 years ago.  The patient will  be admitted to the stroke service under the stroke MD service for a  right brain TIA with a history of TIA and strokes in the past.  Telemetry was found to be sufficient.  She is in good  condition.  She  undoubtedly will pass the swallowing screen and then should start a  heart-healthy diet.  The patient's vitals will be measured q.4 h., neuro  checks q.2 h., and blood glucose checks q.6 h.  An MRI of the brain and  MRA of the brain without contrast have been ordered and an MRA of the  neck was added upon request.  The patient's daughter states that her  mother has recently fallen and had a tender spot at the neck and a  feeling of blood rushing through the left side.  I have also ordered a  chest x-ray because of the patient's also remote history of smoking.  Dr. Marlis Edelson stroke service will continue to follow the patient tomorrow  morning  after the MRI and MRA results are available.  We may then have to do a 2-  D echocardiogram, carotid Doppler, etc. as needed depending if a stroke  was found.  At this time, I consider the patient admitted for  observation only, the status may change.      Melvyn Novas, M.D.  Electronically Signed     CD/MEDQ  D:  12/06/2007  T:  12/07/2007  Job:  [id number]   cc:   Cassell Clement, M.D.  Pramod P. Pearlean Brownie, MD

## 2010-06-25 NOTE — Discharge Summary (Signed)
NAMERINNAH, PEPPEL                ACCOUNT NO.:  [id number]   MEDICAL RECORD NO.:  [id number]          PATIENT TYPE:  INP   LOCATION:  3028                         FACILITY:  [facility]   PHYSICIAN:  Pramod P. Pearlean Brownie, MD    DATE OF BIRTH:  [birth year]   DATE OF ADMISSION:  12/06/2007  DATE OF DISCHARGE:  12/08/2007                               DISCHARGE SUMMARY   DIAGNOSES AT THE TIME OF DISCHARGE:  1. Right brain transient ischemic attack with left facial weakness,      resolved.  2. Atrial fibrillation.  3. Hypertension.  4. Hyperlipidemia.  5. Cervical spinal stenosis.  6. Fibromyalgia.  7. Hypothyroidism.  8. Congestive heart failure.  9. Rheumatoid arthritis.  10.Atrial fibrillation.  11.Depression.  12.Coronary artery disease.  13.History of right pontine infarct.   STUDIES PERFORMED:  1. CT of the brain on admission shows chronic ischemic changes and      atrophy.  No acute disease.  2. Chest x-ray shows mild COPD.  3. MRI of the brain shows old right pontine infarct, otherwise, age-      related atrophy without a pattern of chronic small vessel changes.  4. MRA of the head, normal.  5. MRA of the neck with mild carotid bifurcation irregularly, right      greater than left with a proximal right vertebral artery stenosis,      greater than 70%, possibly is 90% in severity.  Mild narrowing of      left vertebral artery origin.  6. Neck CT, C-spine, shows advanced arthropathy at the C1-2 joint with      respect to the lateral mass articulations, left greater than right,      facet fusions on the left at C2-C3, facet arthropathy at C3-4, left      greater than right, mild foraminal encroachment by osteophytes,      left greater than right, mild spondylosis C4-5 through C6-7 without      suspicion of neural compressive pathology.  7. A 2D echo performed, results pending.  8. Carotid Doppler shows no ICA stenosis.  9. EKG shows sinus rhythm with first-degree AV block, ST- and  T-wave      abnormality, consider anterior ischemia.   LABORATORY STUDIES:  Homocystine 8.7.  PT 24.7 and 2.1.  At discharge,  urinalysis negative.  Hemoglobin A1c 5.9.  Cholesterol 377,  triglycerides 514, HDL 44, and LDL not calculated.  Troponin negative.  CK 389, CK-MB 13.3.  Chemistry normal.  INR on admission 2.0.  CBC  normal.   HISTORY OF PRESENT ILLNESS:  Amber [name] is a 76 year old right-handed  Caucasian female who is a patient of Dr. Patty Sermons.  She was seen in the  emergency room where she presented at around 6:00 p.m. with a Code  Stroke calling.  The patient had a chief complaint of left-sided facial  numbness and puffiness that she has described of her face and neck and  left temple pain.  She has a history of prior stroke.  She had  significant improvement in the emergency room and resolved within 1  hour.  NIH stroke scale was 0.  She has multiple vascular risk factors.  She was admitted to the hospital for further stroke evaluation.  She was  not a tPA candidate secondary to resolution of symptoms.   HOSPITAL COURSE:  MRI did not reveal any acute stroke, and the patient  was diagnosed with a right brain TIA.  Thoughts are, her TIA came from  her atrial fibrillation history even though she was therapeutic on  Coumadin.  She was on aspirin, Plavix, and Coumadin prior to admission  as recommended that at least one of these be stopped to decrease her  hemorrhage risk.  After discussion with Dr. Patty Sermons, plans were to  stop the aspirin and continue Plavix and Coumadin.  The patient also is  to stop Premarin after discussion with Dr. Patty Sermons due to its  hypercoagulable properties.  She was seen by PT and OT and recommended  to have home health followup, which has been arranged.  She will follow  up with Dr. Patty Sermons and Dr. Sandria Manly at the time of discharge.  She may  take Tylenol p.r.n. for pain.   CONDITION ON DISCHARGE:  The patient is alert and oriented x3.   Speech  clear.  Language normal.  Her face is symmetric.  Her tongue is midline.  She has no drift in her upper extremities.  No focal weakness.  Her gait  is steady.   DISCHARGE/PLAN:  1. Discharge home with family.  2. Home health PT and OT.  3. Aspirin and Coumadin for secondary stroke prevention.  Stop      aspirin.  Stop Premarin.  4. Follow up with Dr. Patty Sermons.  5. Follow up with Dr. Sandria Manly.   MEDICINES AT THE TIME OF DISCHARGE:  1. Atenolol 50 mg q.a.m.  2. Plavix 75 mg q.a.m.  3. Lasix 40 mg b.i.d.  4. Celebrex 200 mg b.i.d.  5. Zetia 10 mg every day.  6. Citalopram 20 mg twice a day.  7. Amitriptyline 75 mg q.p.m.  8. Lanoxin 0.125 mg q.p.m.  9. Coumadin 7.5 mg every other day.  10.Coumadin 5 mg every other day.  11.Amitriptyline 75 mg every evening.   1. Synthroid 25 mcg every day.  2. Allopurinol 300 mg nightly.  3. WelChol 1250 three times a day.  4. Atacand 32 mg a day.      Annie Main, N.P.    ______________________________  Sunny Schlein. Pearlean Brownie, MD    SB/MEDQ  D:  12/08/2007  T:  12/08/2007  Job:  [id number]   cc:   Cassell Clement, M.D.

## 2010-06-27 ENCOUNTER — Ambulatory Visit (INDEPENDENT_AMBULATORY_CARE_PROVIDER_SITE_OTHER): Payer: Medicare Other | Admitting: Cardiology

## 2010-06-27 ENCOUNTER — Encounter: Payer: Self-pay | Admitting: Cardiology

## 2010-06-27 ENCOUNTER — Other Ambulatory Visit: Payer: Self-pay | Admitting: *Deleted

## 2010-06-27 ENCOUNTER — Ambulatory Visit (INDEPENDENT_AMBULATORY_CARE_PROVIDER_SITE_OTHER): Payer: Medicare Other | Admitting: *Deleted

## 2010-06-27 DIAGNOSIS — I110 Hypertensive heart disease with heart failure: Secondary | ICD-10-CM | POA: Insufficient documentation

## 2010-06-27 DIAGNOSIS — E876 Hypokalemia: Secondary | ICD-10-CM

## 2010-06-27 DIAGNOSIS — E039 Hypothyroidism, unspecified: Secondary | ICD-10-CM

## 2010-06-27 DIAGNOSIS — I5030 Unspecified diastolic (congestive) heart failure: Secondary | ICD-10-CM

## 2010-06-27 DIAGNOSIS — I4891 Unspecified atrial fibrillation: Secondary | ICD-10-CM

## 2010-06-27 DIAGNOSIS — I119 Hypertensive heart disease without heart failure: Secondary | ICD-10-CM

## 2010-06-27 DIAGNOSIS — E7800 Pure hypercholesterolemia, unspecified: Secondary | ICD-10-CM

## 2010-06-27 MED ORDER — WARFARIN SODIUM 5 MG PO TABS: ORAL_TABLET | ORAL | Status: DC

## 2010-06-27 MED ORDER — POTASSIUM CHLORIDE CRYS ER 20 MEQ PO TBCR: 20.0000 meq | EXTENDED_RELEASE_TABLET | Freq: Two times a day (BID) | ORAL | Status: DC

## 2010-06-27 MED ORDER — LEVOTHYROXINE SODIUM 25 MCG PO TABS: 25.0000 ug | ORAL_TABLET | Freq: Every day | ORAL | Status: DC

## 2010-06-27 MED ORDER — METOPROLOL SUCCINATE ER 25 MG PO TB24: 25.0000 mg | ORAL_TABLET | Freq: Every day | ORAL | Status: DC

## 2010-06-27 MED ORDER — EZETIMIBE 10 MG PO TABS: 10.0000 mg | ORAL_TABLET | Freq: Every day | ORAL | Status: DC

## 2010-06-27 MED ORDER — AMLODIPINE BESYLATE 5 MG PO TABS: 5.0000 mg | ORAL_TABLET | Freq: Every day | ORAL | Status: DC

## 2010-06-27 NOTE — Assessment & Plan Note (Signed)
The patient has had better blood pressure control recently.  She has been on Diovan 160 mg one daily.  His not having any headaches or dizziness

## 2010-06-27 NOTE — Assessment & Plan Note (Signed)
The patient has a history of atrial fibrillation.  She has not had any new thromboembolic events.  Her symptoms of dyspnea have improved.  Her INR today is therapeutic.  It is 2.6.  She has now been therapeutic for 2 consecutive weeks.  We might consider elective cardioversion after she's had 4 or more weeks of therapeutic INRs.  She has difficulty holding sinus rhythm however and if her symptoms are not severe we may leave her in atrial fib and to simply continue with rate control.

## 2010-06-27 NOTE — Assessment & Plan Note (Signed)
The patient has not had any recent increase in dyspnea.  She's had only minimal ankle edema.  She's not having any orthopnea or paroxysmal nocturnal dyspnea.  She denies any chest pain or angina.

## 2010-06-27 NOTE — Progress Notes (Signed)
Amber [name] Date of Birth:  February 13, 1934 [facility] [address]   [address] [place], [place]  [address] [phone]           Fax   [phone]  History of Present Illness: This pleasant 76 year old woman is seen back for a scheduled followup visit.  She has a complex past medical history.  She has a history of dyspnea felt to be multifactorial and secondary to a combination of diastolic dysfunction atrial fibrillation and chronic obstructive pulmonary disease.  She was hospitalized from 04/27/10 through 05/03/10.  During her recent hospital stay she had a LexiScan Myoview which showed normal motion and an ejection fraction of 70% and no scar or ischemia.  She does have known coronary disease and is status post coronary artery bypass graft surgery in June of 2000 with a left internal mammary artery to the LAD.  The patient had a CT angiogram of the chest on April 28, 2010 which showed no evidence of pulmonary embolus but she did have some evidence of congestive heart failure.  The patient had an echocardiogram on April 29, 2010 which showed normal systolic function and no motion abnormality in the left atrium was mildly dilated and she had pulmonary hypertension with a pressure of 44 mm mercury.  Additional medical diagnoses include hyperlipidemia hypertension past history of TIA and past history of stroke and a problem with osteoarthritis fibromyalgia and osteoporosis as well as hypothyroidism and cervical spinal stenosis and gout and depression.  Current Outpatient Prescriptions  Medication Sig Dispense Refill  . amLODipine (NORVASC) 5 MG tablet Take 1 tablet (5 mg total) by mouth daily.  90 tablet  3  . celecoxib (CELEBREX) 200 MG capsule Take 200 mg by mouth 2 (two) times daily.        . citalopram (CELEXA) 20 MG tablet TAKE 2 TABLETS DAILY  60 tablet  11  . clopidogrel (PLAVIX) 75 MG tablet Take 75 mg by mouth daily.        . digoxin (LANOXIN) 0.125 MG tablet  Take 125 mcg by mouth daily.        . famotidine (PEPCID) 20 MG tablet Take 20 mg by mouth daily.        . furosemide (LASIX) 40 MG tablet Take 1 tablet (40 mg total) by mouth 3 (three) times daily.  90 tablet  11  . levothyroxine (SYNTHROID, LEVOTHROID) 25 MCG tablet Take 1 tablet (25 mcg total) by mouth daily.  90 tablet  3  . metoprolol succinate (TOPROL-XL) 25 MG 24 hr tablet Take 1 tablet (25 mg total) by mouth daily.  90 tablet  3  . Multiple Vitamin (MULTIVITAMIN PO) Take by mouth.        . valsartan (DIOVAN) 160 MG tablet Take 160 mg by mouth daily.        [name] warfarin (COUMADIN) 5 MG tablet Taking 7.5 mg x 2 day 5 mg x 5 days or as directed  110 tablet  3  . DISCONTD: amLODipine (NORVASC) 5 MG tablet Take 1 tablet (5 mg total) by mouth daily.  30 tablet  11  . DISCONTD: levothyroxine (SYNTHROID, LEVOTHROID) 25 MCG tablet Take 25 mcg by mouth daily.        [name] DISCONTD: metoprolol succinate (TOPROL-XL) 25 MG 24 hr tablet Take 25 mg by mouth daily.        [name] DISCONTD: potassium chloride SA (K-DUR,KLOR-CON) 20 MEQ tablet Take 20 mEq by mouth 2 (two) times daily.        [name]  DISCONTD: warfarin (COUMADIN) 5 MG tablet Take 5 mg by mouth daily. Taking 7.5 mg x 1 day 5 mg x 6 days      . alendronate (FOSAMAX) 70 MG tablet Take 70 mg by mouth every 7 (seven) days. Take with a full glass of water on an empty stomach.       [name] allopurinol (ZYLOPRIM) 300 MG tablet Take 300 mg by mouth daily.        [name] amitriptyline (ELAVIL) 100 MG tablet Take 50 mg by mouth at bedtime.       [name] ezetimibe (ZETIA) 10 MG tablet Take 1 tablet (10 mg total) by mouth daily.  90 tablet  3  . potassium chloride SA (K-DUR,KLOR-CON) 20 MEQ tablet Take 1 tablet (20 mEq total) by mouth 2 (two) times daily.  180 tablet  3  . tiotropium (SPIRIVA) 18 MCG inhalation capsule One puff every other day for two weeks, then stop  30 capsule  1  . traMADol (ULTRAM) 50 MG tablet As needed      . DISCONTD: WELCHOL 625 MG tablet 2 tablets every meal         Allergies  Allergen Reactions  . Lipitor (Atorvastatin Calcium)   . Mevacor (Lovastatin)   . Pravachol   . Vasotec     Patient Active Problem List  Diagnoses  . Atrial fibrillation  . Unspecified cerebral artery occlusion with cerebral infarction  . Heart failure, diastolic, due to HTN  . Dyslipidemia  . Fibromyalgia  . Insomnia due to mental condition  . Depression  . Cervical spinal stenosis  . Hypothyroidism  . Osteoarthritis  . Osteoporosis  . Anemia  . Ischemic heart disease  . S/P CABG (coronary artery bypass graft)  . Benign hypertensive heart disease without heart failure  . COPD (chronic obstructive pulmonary disease)  . Diastolic heart failure secondary to hypertension    History  Smoking status  . Former Smoker -- 1.5 packs/day for 25 years  . Types: Cigarettes  . Quit date: 02/10/1977  Smokeless tobacco  . Never Used    History  Alcohol Use No    No family history on file.  Review of Systems: Constitutional: no fever chills diaphoresis or fatigue or change in weight.  Head and neck: no hearing loss, no epistaxis, no photophobia or visual disturbance. Respiratory: No cough, shortness of breath or wheezing. Cardiovascular: No chest pain peripheral edema, palpitations. Gastrointestinal: No abdominal distention, no abdominal pain, no change in bowel habits hematochezia or melena. Genitourinary: No dysuria, no frequency, no urgency, no nocturia. Musculoskeletal:The patient is in a walking boot for a resolving left tibial fracture Neurological: No dizziness, no headaches, no numbness, no seizures, no syncope, no weakness, no tremors. Hematologic: No lymphadenopathy, no easy bruising. Psychiatric: No confusion, no hallucinations, no sleep disturbance.    Physical Exam: Filed Vitals:   06/27/10 1603  BP: 140/80  Pulse: 77  The general appearance feels a well-developed well-nourished woman in no acute distress.Pupils equal and reactive.    Extraocular Movements are full.  There is no scleral icterus.  The mouth and pharynx are normal.  The neck is supple.  The carotids reveal no bruits.  The jugular venous pressure is normal.  The thyroid is not enlarged.  There is no lymphadenopathy.The chest is clear to percussion and auscultation. There are no rales or rhonchi. Expansion of the chest is symmetrical.The precordium is quiet.  The first heart sound is normal.  The second heart sound is physiologically split.  There is no murmur gallop rub or click.  There is no abnormal lift or heave. The rhythm is irregular.The abdomen is soft and nontender. Bowel sounds are normal. The liver and spleen are not enlarged. There Are no abdominal masses. There are no bruits.  Normal extremity without phlebitis patient does have trace ankle edema bilaterally.  She wears a walking boot on the left ankle.There are no overt psychiatric signs or symptoms.  Normal integument.   Assessment / Plan: The patient is to continue her present medications.  We refilled several of her medicines today for her.  She'll return in one month for followup office visit and protime and EKG.  We will talk further about the pros and cons of elective cardioversion after next visit.

## 2010-06-27 NOTE — Telephone Encounter (Signed)
Refilled meds per fax request.  

## 2010-06-28 NOTE — Discharge Summary (Signed)
[place]. [facility]  Patient:    Amber [name], Amber [name]                          MRN: [id number] Adm. Date:  [id number] Disc. Date: [id number] Attending:  Herold Harms Dictator:   Dian Situ, PA CC:         Clovis Pu. Patty Sermons, M.D.             Catherine A. Orlin Hilding, M.D.                           Discharge Summary  DISCHARGE DIAGNOSES:  1. Status post right pontine infarct.  2. Hypertension.  3. Coronary artery disease.  4. History of fibromyalgia.  5. Hyperlipidemia.  HISTORY OF PRESENT ILLNESS:  Amber [name] is a 76 year old female with history of coronary artery disease, hyperlipidemia, three-day history of left-sided weakness and right facial numbness with carotid Dopplers done outpatient showing no stenosis.  Patient was started on Plavix.  She was admitted to [facility] on February 15 ______ symptoms.  MRI, MRA done showed acute right pontine infarct, no stenosis.  She was started on heparin and aspirin, Plavix resumed ______ February 20.  A 2D echocardiogram done showed septal hypokinesis, normal EF.  Patient has been followed by cardiology for increased CKs and hypertension. Currently, blood pressures are better controlled.  She is minimal assist supervision for transfers, total assist +2 for ambulating 20 feet, with loss of  balance posteriorly, and right knee instability.  She continues with left hemiparesis and left facial droop.  She was started on Tricor for hyperlipidemia.  PAST MEDICAL HISTORY:  Significant for CAD status post CABG, ______, insomnia, history of nasal polyps, fibromyalgia, and hyperlipidemia.  ALLERGIES:  CECLOR, MORPHINE, CODEINE, and LIPITOR.  SOCIAL HISTORY:  Patient is married, was working part-time prior to admission. She lives in two-level home with four steps at entry.  She was independent prior to  admission.  She does not use any alcohol.  She quit tobacco use 28 years ago.  HOSPITAL  COURSE:  Amber [name] was admitted to rehab on April 03, 1999, for inpatient therapies to consist of PT, OT daily.  ______ admission, patient was controlled on aspirin and Plavix for CVA prophylaxis.  Blood pressures were monitored on a b.i.d. basis and are reasonably controlled with systolics from 120s to 140s range overall and a diastolic 50s to mid 70s.  P.O. intake has been good. No bowel or bladder complaints reported.  Labs checked at admission showed a sodium of 136, potassium 4.5, chloride 99, CO2 28, BUN 21, and creatinine 0.4. Hemoglobin 13.5, hematocrit 39.3, white count 6.7, platelets 291.  SGOT 38, SGPT 28, T. bili 0.8.  Subcu Lovenox was placed initially until mobility improved.  She was maintained on a honey-thick pureed diet until MBS done on February 23.  This showed patient without any signs of aspiration or penetration, and patient was advanced to D3 all liquids.  There was a question of presence of cricopharyngeal bar on MBS. Dr. Patty Sermons has been following along and adjusting BP medications as well as hypercholesterolemia medications.  Patient did have some complaints of chest pressure on February 26 and EKG done was essentially without changes, normal sinus rhythm.  Chest x-ray done showed no active disease.  Patient has had high anxiety issues regarding high anxiety initially past admission.  He was noted to be  much improved.  She does occasionally have flush feeling from her right face to left  face that is transient and has continued since CVA.  Patient has been instructed regarding low fat, low cholesterol program, salt restrictions prior to discharge.  At time of discharge, patient is at supervision for bed mobility, supervision for transfers, close supervision with occasional minimal assist ambulating 100 feet. She is moderately independent for upper and lower body care and requires intermittent supervision occasionally for low body dressing.   She is at distant  supervision for toileting, requires minimal assist for navigation of stairs. She is aware of safety issues and need for 24-hour supervision past discharge. Family is to provide this.  Follow-up therapies in terms of outpatient PT and TO  have  been set up at [facility] outpatient rehab to begin Tuesday, March 13, at 1 p.m.  On April 18, 1999, patient is discharged to home.  DISCHARGE MEDICATIONS:  1. Meclizine 25 mg per day.  2. Norvasc 10 mg per day.  3. Celebrex 200 mg b.i.d.  4. Tricor 67 mg b.i.d.  5. ______ 3 p.o. b.i.d.  6. Lasix 20 mg b.i.d.  7. Tenormin 50 mg half p.o. per day.  8. Cozaar 5 mg per day.  9. Plavix 75 mg per day. 10. Coated aspirin 325 mg per day. 11. Elavil 100 mg q.h.s. 12. K-Dur 20 mEq b.i.d. 13. Premarin 0.9 mg per day.  ACTIVITY:  Twenty-four hours assistance.  DIET:  Low cholesterol, 4 g salt.  SPECIAL INSTRUCTIONS:  No alcohol, no smoking, no driving.  Outpatient therapies to begin Tuesday at March 13.  FOLLOW-UP:  Patient is to follow up with Dr. Patty Sermons in one to two weeks. Follow up with Dr. Ellwood Dense as needed. DD:  04/18/99 TD:  04/19/99 Job: [id number]

## 2010-06-28 NOTE — Discharge Summary (Signed)
[place]. [facility]  Patient:    Amber [name], Amber [name]                          MRN: [id number] Adm. Date:  [id number] Disc. Date: 04/03/99 Attending:  Durel Salts CC:         Clovis Pu Patty Sermons, M.D.             [facility]                           Discharge Summary  ADMITTING DIAGNOSIS: 1.  Transient ischemic attack.  DISCHARGE DIAGNOSIS: 1.  Right pontine small vessel ischemic stroke.  DISCHARGE MEDICATIONS: 1.  Welchol two tablets b.i.d. with meals. 2.  Tricor 67 mg b.i.d. 3.  Plavix 75 mg p.o. q.d. 4.  Enteric-coated aspirin 325 mg q.d. 5.  Atenolol 50 mg b.i.d. 6.  Xanax 0.5 mg q.8h p.r.n. anxiety. 7.  Nitroglycerin 1/150 sublingual p.r.n. 8.  Potassium 20 mEq b.i.d. 9.  Amitriptyline 100 mg p.o. q.h.s. 10. Celebrex 200 mg p.o. b.i.d. 11. Cozaar 50 mg p.o. q.d. 12. Norvasc 5 mg p.o. q.d. 13. Lasix 20 mg p.o. b.i.d. 14. Premarin 0.9 mg p.o. q.d. 15. Tylenol p.r.n.  ACTIVITY:  The patient is being transferred to rehabilitation services for comprehensive inpatient rehabilitation of her stroke with left hemiparesis.  DIET:  Heart prudent diet.  WOUND CARE:  Not applicable.  SPECIAL INSTRUCTIONS:  Not applicable.  DISPOSITION:  Follow-up pending her rehabilitation and progress.  PROCEDURES:  The patient had an MRI scan of the brain that showed an acute right pontine infarct.  MR angiogram showed mild nonstenotic intracranial atherosclerotic changes primarily in the posterior cerebral arteries but no evidence for significant proximal vertebrobasilar disease.  No mid basilar stenosis or embolus. No intracranial aneurysms.  CT scan on admission was essentially normal.  She had an echocardiogram that showed no cardiac source of embolus with a grossly normal left ventricle, borderline left ventricular hypertrophy, septal hypokinesis, grossly normal left ventricular ejection fraction, mildly dilated left  atrium, normal right ventricle, normal right atrium, normal aortic root; no effusion; normal looking aortic valve; normal mitral valve; normal tricuspid valve with trace tricuspid regurgitation.  EKG showed normal sinus rhythm.  She had had outpatient carotid Dopplers that were normal.  HOSPITAL COURSE:  Please refer to the History and Physical for details. Briefly, this patient is a 76 year old woman who presented with a three day stuttering history of left-sided clumsiness and slurred speech.  On admission she had only  very soft findings of decreased rapid fine movement on the left and possibility an upgoing toe on that side.  She was placed on heparin for presumed crescendo TIA and studies were obtained looking for source of clot.  No source of clot was found nd the patient went on to complete a stroke in the right pontine region, which was  small, lacunar in appearance, probably due to unavoidable small vessel occlusion. She does have hypertension and hypercholesterolemia.  After the initial progression over the weekend she stabilized with a moderate hemiparesis with about 3/5 on average, some a little bit better and some worse.  She was seen by speech therapy, occupational therapy, and physical therapy, who recommended inpatient stay. She did have some chest pain while she was here and her cardiologist, Dr. Patty Sermons, saw her for this.  There was no apparent injury to the heart.  She had some fluctuations in blood pressure which stabilized with some adjustment of her hypertensive medications.  She is ready at this time for discharge to rehabilitation for continued physical therapy.  DISCHARGE DIAGNOSIS: 1.  Right pontine ischemic small vessel stroke with left hemiparesis. 2.  The patient also has a history of coronary artery disease, with coronary     artery bypass graft in June 2000. 3.  History of fibromyalgia. 4.  History of hypertension. 5.  History of  hypertriglyceridemia. 6.  Hypercholesterolemia. 7.  Osteoarthritis.  DISCHARGE CONDITION:  She is discharged in stable condition with a moderate left hemiparesis. DD:  04/03/99 TD:  04/03/99 Job: [id number]

## 2010-06-30 ENCOUNTER — Other Ambulatory Visit: Payer: Self-pay | Admitting: Cardiology

## 2010-07-01 NOTE — Telephone Encounter (Signed)
escribe request  

## 2010-07-02 ENCOUNTER — Other Ambulatory Visit: Payer: Self-pay | Admitting: Cardiology

## 2010-07-02 ENCOUNTER — Ambulatory Visit: Payer: Medicare Other | Admitting: Cardiovascular Disease

## 2010-07-02 NOTE — Telephone Encounter (Signed)
escribe request  

## 2010-07-03 ENCOUNTER — Ambulatory Visit: Payer: Medicare Other | Admitting: Cardiology

## 2010-07-09 ENCOUNTER — Encounter: Payer: Self-pay | Admitting: Cardiology

## 2010-07-17 ENCOUNTER — Encounter: Payer: Self-pay | Admitting: Rheumatology

## 2010-08-06 ENCOUNTER — Ambulatory Visit (INDEPENDENT_AMBULATORY_CARE_PROVIDER_SITE_OTHER): Payer: Medicare Other | Admitting: Cardiology

## 2010-08-06 ENCOUNTER — Encounter: Payer: Self-pay | Admitting: Cardiology

## 2010-08-06 ENCOUNTER — Ambulatory Visit (INDEPENDENT_AMBULATORY_CARE_PROVIDER_SITE_OTHER): Payer: Medicare Other | Admitting: *Deleted

## 2010-08-06 DIAGNOSIS — I4891 Unspecified atrial fibrillation: Secondary | ICD-10-CM

## 2010-08-06 DIAGNOSIS — I110 Hypertensive heart disease with heart failure: Secondary | ICD-10-CM

## 2010-08-06 DIAGNOSIS — Z01812 Encounter for preprocedural laboratory examination: Secondary | ICD-10-CM

## 2010-08-06 DIAGNOSIS — I509 Heart failure, unspecified: Secondary | ICD-10-CM

## 2010-08-06 DIAGNOSIS — I5030 Unspecified diastolic (congestive) heart failure: Secondary | ICD-10-CM

## 2010-08-06 NOTE — Assessment & Plan Note (Signed)
The patient has not been expressing any new cardiac symptoms.  She's had no TIA symptoms.  She's not having any orthopnea or paroxysmal nocturnal dyspnea.  She has not been having any problem with ankle edema.

## 2010-08-06 NOTE — Progress Notes (Signed)
Amber [name] Date of Birth:  [birth year] [facility] [address]   [address] [place], [place]  [address] [phone]           Fax   [phone]  History of Present Illness: This pleasant 76 year old woman is seen for a scheduled followup office visit and protime.  Her prothrombin time today is therapeutic.  She has been in atrial fibrillation since March of 2012.  She was admitted in March with exacerbation of diastolic congestive heart failure.  She has a history of ischemic heart disease.  She underwent coronary artery bypass graft surgery in June of 2000 with a left internal mammary artery to the LAD.  He had a LexiScan Myoview on May 01, 2010 showing no scar or ischemia and her ejection fraction was greater than 70%.  There was normal wall motion.  A CT angiogram on March 18 showed no pulmonary emboli an echocardiogram showed mild dilatation of the left atrium her pulmonary artery pressure was mildly elevated at 44 mm mercury.  She has been therapeutic on her Coumadin for more than 6 weeks and we discussed elective cardioversion.  She would like to proceed prior to July 1 and we secured her opening at [facility] short stay at 9 AM on Thursday, June 28.  Current Outpatient Prescriptions  Medication Sig Dispense Refill  . alendronate (FOSAMAX) 70 MG tablet Take 70 mg by mouth every 7 (seven) days. Take with a full glass of water on an empty stomach.       [name] allopurinol (ZYLOPRIM) 300 MG tablet Take 300 mg by mouth daily.        [name] amitriptyline (ELAVIL) 100 MG tablet Take 50 mg by mouth at bedtime.       [name] amLODipine (NORVASC) 5 MG tablet Take 1 tablet (5 mg total) by mouth daily.  90 tablet  3  . celecoxib (CELEBREX) 200 MG capsule Take 200 mg by mouth 2 (two) times daily.        . citalopram (CELEXA) 20 MG tablet TAKE 2 TABLETS DAILY  60 tablet  11  . clopidogrel (PLAVIX) 75 MG tablet Take 75 mg by mouth daily.        [name] ezetimibe (ZETIA) 10 MG tablet Take 1  tablet (10 mg total) by mouth daily.  90 tablet  3  . famotidine (PEPCID) 20 MG tablet Take 20 mg by mouth daily.        . furosemide (LASIX) 40 MG tablet TAKE 1 TABLET THREE TIMES A DAY  270 tablet  3  . LANOXIN 0.125 MG tablet TAKE 1 TABLET EVERY DAY  90 tablet  6  . levothyroxine (SYNTHROID, LEVOTHROID) 25 MCG tablet Take 1 tablet (25 mcg total) by mouth daily.  90 tablet  3  . metoprolol succinate (TOPROL-XL) 25 MG 24 hr tablet Take 1 tablet (25 mg total) by mouth daily.  90 tablet  3  . Multiple Vitamin (MULTIVITAMIN PO) Take by mouth.        . potassium chloride SA (K-DUR,KLOR-CON) 20 MEQ tablet Take 1 tablet (20 mEq total) by mouth 2 (two) times daily.  180 tablet  3  . traMADol (ULTRAM) 50 MG tablet As needed      . valsartan (DIOVAN) 160 MG tablet Take 160 mg by mouth daily.        [name] warfarin (COUMADIN) 5 MG tablet Taking 7.5 mg x 2 day 5 mg x 5 days or as directed  110  tablet  3  . DISCONTD: tiotropium (SPIRIVA) 18 MCG inhalation capsule One puff every other day for two weeks, then stop  30 capsule  1    Allergies  Allergen Reactions  . Lipitor (Atorvastatin Calcium)   . Mevacor (Lovastatin)   . Pravachol   . Vasotec     Patient Active Problem List  Diagnoses  . Atrial fibrillation  . Unspecified cerebral artery occlusion with cerebral infarction  . Heart failure, diastolic, due to HTN  . Dyslipidemia  . Fibromyalgia  . Insomnia due to mental condition  . Depression  . Cervical spinal stenosis  . Hypothyroidism  . Osteoarthritis  . Osteoporosis  . Anemia  . Ischemic heart disease  . S/P CABG (coronary artery bypass graft)  . Benign hypertensive heart disease without heart failure  . COPD (chronic obstructive pulmonary disease)  . Diastolic heart failure secondary to hypertension    History  Smoking status  . Former Smoker -- 1.5 packs/day for 25 years  . Types: Cigarettes  . Quit date: 02/10/1977  Smokeless tobacco  . Never Used    History  Alcohol Use  No    No family history on file.  Review of Systems: Constitutional: no fever chills diaphoresis or fatigue or change in weight.  Head and neck: no hearing loss, no epistaxis, no photophobia or visual disturbance. Respiratory: No cough, shortness of breath or wheezing. Cardiovascular: No chest pain peripheral edema, palpitations. Gastrointestinal: No abdominal distention, no abdominal pain, no change in bowel habits hematochezia or melena. Genitourinary: No dysuria, no frequency, no urgency, no nocturia. Musculoskeletal:No arthralgias, no back pain, no gait disturbance or myalgias. Neurological: No dizziness, no headaches, no numbness, no seizures, no syncope, no weakness, no tremors. Hematologic: No lymphadenopathy, no easy bruising. Psychiatric: No confusion, no hallucinations, no sleep disturbance.    Physical Exam: Filed Vitals:   08/06/10 1524  BP: 130/70  Pulse: 78  The general appearance reveals a well-developed well-nourished woman in no distress.Pupils equal and reactive.   Extraocular Movements are full.  There is no scleral icterus.  The mouth and pharynx are normal.  The neck is supple.  The carotids reveal no bruits.  The jugular venous pressure is normal.  The thyroid is not enlarged.  There is no lymphadenopathy.The chest is clear to percussion and auscultation. There are no rales or rhonchi. Expansion of the chest is symmetrical.  Heart reveals a soft systolic ejection murmur and her pulse is irregular in atrial flutter with a controlled ventricular response.  No diastolic murmur.  No gallop or rub.The abdomen is soft and nontender. Bowel sounds are normal. The liver and spleen are not enlarged. There Are no abdominal masses. There are no bruits.The pedal pulses are good.  There is no phlebitis or edema.  There is no cyanosis or clubbing.  Her left foot is in a metal brace  EKG today shows atrial flutter fibrillation with nonspecific ST-T wave abnormalities and a controlled  ventricular response  Assessment / Plan: We [employer] lab work today.  We did not need to do another chest x-ray since she has had recent chest x-rays in the common system over the past 3 months.  Cardioversion is planned for 9 AM on Thursday, June 28 and she will return in about 10 days to 2 weeks for followup office visit protime and EKG after the procedure

## 2010-08-06 NOTE — Assessment & Plan Note (Signed)
The patient has a history of atrial fibrillation.  In the past she has gone back into his normal sinus rhythm spontaneously.  However this time she has remained in atrial flutter fibrillation.  She has been therapeutic on her Coumadin now for 6 weeks.  She is interested in pursuing electrical cardioversion and we discussed that today.  We will plan to cardiovert her electively on Thursday morning June 28 at 9 AM at [facility] short stay.  She will take no medications that morning except for Toprol if she normally takes Toprol in the morning.  She could not remember to day whether she takes her Toprol in the morning or the previous evening.

## 2010-08-07 ENCOUNTER — Encounter: Payer: Self-pay | Admitting: Cardiology

## 2010-08-07 LAB — CBC WITH DIFFERENTIAL/PLATELET
Eosinophils Relative: 1.1 % (ref 0.0–5.0)
Monocytes Relative: 6.5 % (ref 3.0–12.0)
Neutrophils Relative %: 65.4 % (ref 43.0–77.0)
Platelets: 269 10*3/uL (ref 150.0–400.0)
WBC: 8.9 10*3/uL (ref 4.5–10.5)

## 2010-08-07 LAB — BASIC METABOLIC PANEL
CO2: 29 mEq/L (ref 19–32)
Calcium: 9.3 mg/dL (ref 8.4–10.5)
Chloride: 103 mEq/L (ref 96–112)
Sodium: 140 mEq/L (ref 135–145)

## 2010-08-07 LAB — APTT: aPTT: 36.5 s — ABNORMAL HIGH (ref 21.7–28.8)

## 2010-08-08 ENCOUNTER — Encounter: Payer: Self-pay | Admitting: Cardiology

## 2010-08-08 ENCOUNTER — Ambulatory Visit (HOSPITAL_COMMUNITY)
Admission: RE | Admit: 2010-08-08 | Discharge: 2010-08-08 | Disposition: A | Discharge status: Home / Self Care | Payer: Medicare Other | Source: Ambulatory Visit | Attending: Cardiology | Admitting: Cardiology

## 2010-08-08 DIAGNOSIS — J449 Chronic obstructive pulmonary disease, unspecified: Secondary | ICD-10-CM | POA: Insufficient documentation

## 2010-08-08 DIAGNOSIS — Z7901 Long term (current) use of anticoagulants: Secondary | ICD-10-CM | POA: Insufficient documentation

## 2010-08-08 DIAGNOSIS — I10 Essential (primary) hypertension: Secondary | ICD-10-CM | POA: Insufficient documentation

## 2010-08-08 DIAGNOSIS — Z8673 Personal history of transient ischemic attack (TIA), and cerebral infarction without residual deficits: Secondary | ICD-10-CM | POA: Insufficient documentation

## 2010-08-08 DIAGNOSIS — I4891 Unspecified atrial fibrillation: Secondary | ICD-10-CM

## 2010-08-08 DIAGNOSIS — Z01810 Encounter for preprocedural cardiovascular examination: Secondary | ICD-10-CM | POA: Insufficient documentation

## 2010-08-08 DIAGNOSIS — I509 Heart failure, unspecified: Secondary | ICD-10-CM | POA: Insufficient documentation

## 2010-08-08 DIAGNOSIS — J4489 Other specified chronic obstructive pulmonary disease: Secondary | ICD-10-CM | POA: Insufficient documentation

## 2010-08-08 DIAGNOSIS — Z951 Presence of aortocoronary bypass graft: Secondary | ICD-10-CM | POA: Insufficient documentation

## 2010-08-09 ENCOUNTER — Telehealth: Payer: Self-pay | Admitting: *Deleted

## 2010-08-09 NOTE — Progress Notes (Signed)
Advised 

## 2010-08-09 NOTE — Telephone Encounter (Signed)
Message copied by Burnell Blanks on Fri Aug 09, 2010  3:51 PM ------      Message from: Cassell Clement      Created: Thu Aug 08, 2010 10:16 AM       Blood work is satisfactory for cardioversion.  No report needed.

## 2010-08-09 NOTE — Telephone Encounter (Signed)
Left message with husband.

## 2010-08-09 NOTE — Op Note (Signed)
  NAMECIELLE, AGUILA NO.:  [id number]  MEDICAL RECORD NO.:  [id number]  LOCATION:  [facility]                         FACILITY:  [facility]  PHYSICIAN:  Cassell Clement, M.D. DATE OF BIRTH:  May 30, 1934  DATE OF PROCEDURE:  08/08/2010 DATE OF DISCHARGE:                              OPERATIVE REPORT   PROCEDURE:  Direct current cardioversion.  HISTORY:  This is a 76 year old woman with a history of congestive heart failure, hypertension, and recent atrial fibrillation, is brought in for elective cardioversion.  She has been on therapeutic levels of Coumadin for more than a month.  After being given 100 mg of propofol by Dr. Chaney Malling, the patient was given a single 120 joules shock using the AP pads.  She converted promptly to normal sinus rhythm with first-degree heart block.  She tolerated the procedure well.  IMPRESSION:  Successful direct current cardioversion of atrial fibrillation into normal sinus rhythm.          ______________________________ Cassell Clement, M.D.     TB/MEDQ  D:  08/08/2010  T:  08/08/2010  Job:  [id number]  Electronically Signed by Cassell Clement M.D. on 08/09/2010 03:31:34 PM

## 2010-08-09 NOTE — Telephone Encounter (Signed)
Message copied by Burnell Blanks on Fri Aug 09, 2010  3:49 PM ------      Message from: Cassell Clement      Created: Thu Aug 08, 2010 10:16 AM       Blood work is satisfactory for cardioversion.  No report needed.

## 2010-08-09 NOTE — Telephone Encounter (Signed)
Message copied by Burnell Blanks on Fri Aug 09, 2010  3:46 PM ------      Message from: Cassell Clement      Created: Thu Aug 08, 2010 10:16 AM       Blood work is satisfactory for cardioversion.  No report needed.

## 2010-08-10 ENCOUNTER — Other Ambulatory Visit: Payer: Self-pay | Admitting: Cardiology

## 2010-08-12 NOTE — Telephone Encounter (Signed)
Med refill

## 2010-08-19 ENCOUNTER — Ambulatory Visit (INDEPENDENT_AMBULATORY_CARE_PROVIDER_SITE_OTHER): Payer: Medicare Other | Admitting: Cardiology

## 2010-08-19 ENCOUNTER — Encounter: Payer: Self-pay | Admitting: Cardiology

## 2010-08-19 VITALS — BP 130/60 | HR 71 | Wt 166.0 lb

## 2010-08-19 DIAGNOSIS — I4891 Unspecified atrial fibrillation: Secondary | ICD-10-CM

## 2010-08-19 DIAGNOSIS — I119 Hypertensive heart disease without heart failure: Secondary | ICD-10-CM

## 2010-08-19 LAB — POCT INR: INR: 2.6

## 2010-08-19 NOTE — Progress Notes (Signed)
Amber [name] Date of Birth:  [birth year] [facility] / [facility] [address]   [address] [place], [place]  [address] [phone]           Fax   [phone]  History of Present Illness: This pleasant 76 year old woman has a complex past medical history per she's had a history of ischemic heart disease with coronary artery bypass graft surgery in 2000.  She's had a long history of previous hypertension.  She has had works with residual left hemiparesthesias she's had a history of significant hypercholesterolemia but because of her fibromyalgia she's been unable to tolerate statins.  She's also had a past history of elevated serum CK levels she's had a history of compensated hypothyroidism.  He has a history of compensated congestive heart failure.  She had successful direct current cardioversion of her atrial Fibrillation on 08/08/10 about one-week later was back in atrial fibrillation.  Current Outpatient Prescriptions  Medication Sig Dispense Refill  . alendronate (FOSAMAX) 70 MG tablet Take 70 mg by mouth every 7 (seven) days. Take with a full glass of water on an empty stomach.       [name] allopurinol (ZYLOPRIM) 300 MG tablet Take 300 mg by mouth daily.        [name] amitriptyline (ELAVIL) 100 MG tablet Take 50 mg by mouth at bedtime.       [name] amLODipine (NORVASC) 5 MG tablet Take 1 tablet (5 mg total) by mouth daily.  90 tablet  3  . celecoxib (CELEBREX) 200 MG capsule Take 200 mg by mouth 2 (two) times daily.        . citalopram (CELEXA) 20 MG tablet TAKE 2 TABLETS DAILY  60 tablet  11  . clopidogrel (PLAVIX) 75 MG tablet Take 75 mg by mouth daily.        [name] DIOVAN 160 MG tablet TAKE 1 TABLET EVERY DAY  90 tablet  4  . ezetimibe (ZETIA) 10 MG tablet Take 1 tablet (10 mg total) by mouth daily.  90 tablet  3  . famotidine (PEPCID) 20 MG tablet Take 20 mg by mouth daily.        . furosemide (LASIX) 40 MG tablet TAKE 1 TABLET THREE TIMES A DAY  270 tablet  3  . LANOXIN 0.125 MG  tablet TAKE 1 TABLET EVERY DAY  90 tablet  6  . levothyroxine (SYNTHROID, LEVOTHROID) 25 MCG tablet Take 1 tablet (25 mcg total) by mouth daily.  90 tablet  3  . metoprolol succinate (TOPROL-XL) 25 MG 24 hr tablet Take 1 tablet (25 mg total) by mouth daily.  90 tablet  3  . Multiple Vitamin (MULTIVITAMIN PO) Take by mouth.        . potassium chloride SA (K-DUR,KLOR-CON) 20 MEQ tablet Take 1 tablet (20 mEq total) by mouth 2 (two) times daily.  180 tablet  3  . traMADol (ULTRAM) 50 MG tablet As needed      . warfarin (COUMADIN) 5 MG tablet Taking 7.5 mg x 2 day 5 mg x 5 days or as directed  110 tablet  3    Allergies  Allergen Reactions  . Lipitor (Atorvastatin Calcium)   . Mevacor (Lovastatin)   . Pravachol   . Vasotec     Patient Active Problem List  Diagnoses  . Atrial fibrillation  . Unspecified cerebral artery occlusion with cerebral infarction  . Heart failure, diastolic, due to HTN  . Dyslipidemia  . Fibromyalgia  . Insomnia due to  mental condition  . Depression  . Cervical spinal stenosis  . Hypothyroidism  . Osteoarthritis  . Osteoporosis  . Anemia  . Ischemic heart disease  . S/P CABG (coronary artery bypass graft)  . Benign hypertensive heart disease without heart failure  . COPD (chronic obstructive pulmonary disease)  . Diastolic heart failure secondary to hypertension    History  Smoking status  . Former Smoker -- 1.5 packs/day for 25 years  . Types: Cigarettes  . Quit date: 02/10/1977  Smokeless tobacco  . Never Used    History  Alcohol Use No    No family history on file.  Review of Systems: Constitutional: no fever chills diaphoresis or fatigue or change in weight.  Head and neck: no hearing loss, no epistaxis, no photophobia or visual disturbance. Respiratory: No cough, shortness of breath or wheezing. Cardiovascular: No chest pain peripheral edema, palpitations. Gastrointestinal: No abdominal distention, no abdominal pain, no change in bowel  habits hematochezia or melena. Genitourinary: No dysuria, no frequency, no urgency, no nocturia. Musculoskeletal:No arthralgias, no back pain, no gait disturbance or myalgias. Neurological: No  headaches, no numbness, no seizures, no syncope, no weakness, no tremors. Hematologic: No lymphadenopathy, no easy bruising. Psychiatric: No confusion, no hallucinations, no sleep disturbance.The patient has been having more problems with decreased memory which is upsetting for her.    Physical Exam: Filed Vitals:   08/19/10 1323  BP: 130/60  Pulse: 71   The general appearance reveals a well-developed well-nourished woman in no distress.Pupils equal and reactive.   Extraocular Movements are full.  There is no scleral icterus.  The mouth and pharynx are normal.  The neck is supple.  The carotids reveal no bruits.  The jugular venous pressure is normal.  The thyroid is not enlarged.  There is no lymphadenopathy.The chest is clear to percussion and auscultation. There are no rales or rhonchi. Expansion of the chest is symmetrical.  Heart rhythm is irregular.  No murmur gallop or rub.The abdomen is soft and nontender. Bowel sounds are normal. The liver and spleen are not enlarged. There Are no abdominal masses. There are no bruits.The pedal pulses are good.  There is no phlebitis or edema.  There is no cyanosis or clubbing.  She has no gross focal neurologic deficits  EKG shows atrial fibrillation which is new since her cardioversion on 08/08/10.  She has pattern of left ventricular strain which is unchanged  Assessment / Plan: Will not make any further attempts at reestablishing normal sinus rhythm.  Our goalWill be chronic Coumadin anticoagulation and rate control.Recheck in 3 months for followup office visit and EKG.  The patient is having more problems with her memory.  He has been on Celexa 20 mg daily and she will try cutting back to just half a tablet a day.  We will also refer her back to Dr. Sandria Manly at  her request For further evaluation and update of her memory problems.  He has seen her in the remote past for her strokes.

## 2010-08-19 NOTE — Assessment & Plan Note (Signed)
The patient has a past history of previous strokes and has had essential hypertension.  Since last visit she's had no new TIA or stroke symptoms.  She does have chronic sensation of lightheadedness.  She's had no syncope

## 2010-08-19 NOTE — Patient Instructions (Signed)
We will make appt with Dr. Sandria Manly regarding your memory.

## 2010-08-19 NOTE — Assessment & Plan Note (Signed)
The patient had a history of atrial fibrillation.  She has had exertional dyspnea.  She has been on long-term Coumadin.  She underwent successful cardioversion back to normal sinus rhythm on 08/08/10.  She returns today for a post procedure visit.  She states that her normal sinus rhythm lasted only a week and then she felt that her heart was irregular again.  EKG today confirms that she is back in atrial fibrillation with a controlled ventricular response.  She's not having any increased dyspnea.  She does complain of mild lightheadedness which may be worse when she is in atrial fib

## 2010-08-20 ENCOUNTER — Ambulatory Visit: Payer: Self-pay | Admitting: *Deleted

## 2010-09-03 ENCOUNTER — Encounter: Payer: Medicare Other | Admitting: *Deleted

## 2010-09-16 ENCOUNTER — Ambulatory Visit (INDEPENDENT_AMBULATORY_CARE_PROVIDER_SITE_OTHER): Payer: Medicare Other | Admitting: *Deleted

## 2010-09-16 ENCOUNTER — Encounter: Payer: Medicare Other | Admitting: *Deleted

## 2010-09-16 DIAGNOSIS — I4891 Unspecified atrial fibrillation: Secondary | ICD-10-CM

## 2010-09-16 LAB — POCT INR: INR: 2.8

## 2010-09-25 ENCOUNTER — Other Ambulatory Visit: Payer: Self-pay | Admitting: Neurology

## 2010-09-25 DIAGNOSIS — R42 Dizziness and giddiness: Secondary | ICD-10-CM

## 2010-09-25 DIAGNOSIS — R413 Other amnesia: Secondary | ICD-10-CM

## 2010-09-25 DIAGNOSIS — F068 Other specified mental disorders due to known physiological condition: Secondary | ICD-10-CM

## 2010-09-26 ENCOUNTER — Encounter: Payer: Self-pay | Admitting: Cardiology

## 2010-09-26 ENCOUNTER — Encounter: Payer: Self-pay | Admitting: Nurse Practitioner

## 2010-09-26 ENCOUNTER — Other Ambulatory Visit: Payer: Self-pay | Admitting: *Deleted

## 2010-09-26 MED ORDER — ALLOPURINOL 300 MG PO TABS: 300.0000 mg | ORAL_TABLET | Freq: Every day | ORAL | Status: DC

## 2010-09-26 NOTE — Telephone Encounter (Signed)
Refilled meds per fax request.  

## 2010-10-09 ENCOUNTER — Ambulatory Visit
Admission: RE | Admit: 2010-10-09 | Discharge: 2010-10-09 | Disposition: A | Discharge status: Home / Self Care | Payer: Medicare Other | Source: Ambulatory Visit | Attending: Neurology | Admitting: Neurology

## 2010-10-09 DIAGNOSIS — F068 Other specified mental disorders due to known physiological condition: Secondary | ICD-10-CM

## 2010-10-09 DIAGNOSIS — R42 Dizziness and giddiness: Secondary | ICD-10-CM

## 2010-10-09 DIAGNOSIS — R413 Other amnesia: Secondary | ICD-10-CM

## 2010-10-17 ENCOUNTER — Ambulatory Visit (INDEPENDENT_AMBULATORY_CARE_PROVIDER_SITE_OTHER): Payer: Medicare Other | Admitting: *Deleted

## 2010-10-17 DIAGNOSIS — I4891 Unspecified atrial fibrillation: Secondary | ICD-10-CM

## 2010-10-17 LAB — POCT INR: INR: 2.6

## 2010-11-12 ENCOUNTER — Other Ambulatory Visit: Payer: Self-pay | Admitting: Cardiology

## 2010-11-12 LAB — HEMOGLOBIN A1C
Hgb A1c MFr Bld: 5.9
Mean Plasma Glucose: 123

## 2010-11-12 LAB — CBC
HCT: 36.3
Hemoglobin: 13.4
Platelets: 243
RBC: 4.28
WBC: 5.9
WBC: 6.3

## 2010-11-12 LAB — CK TOTAL AND CKMB (NOT AT ARMC)
CK, MB: 13.3 — ABNORMAL HIGH
CK, MB: 15.7 — ABNORMAL HIGH
Relative Index: 3.4 — ABNORMAL HIGH
Relative Index: 3.4 — ABNORMAL HIGH
Total CK: 389 — ABNORMAL HIGH

## 2010-11-12 LAB — GLUCOSE, CAPILLARY
Glucose-Capillary: 126 — ABNORMAL HIGH
Glucose-Capillary: 90

## 2010-11-12 LAB — DIFFERENTIAL
Basophils Absolute: 0
Basophils Relative: 1
Basophils Relative: 1
Eosinophils Relative: 1
Lymphocytes Relative: 42
Lymphs Abs: 2.5
Monocytes Absolute: 0.4
Monocytes Absolute: 0.5
Monocytes Relative: 7
Neutro Abs: 3.6

## 2010-11-12 LAB — LIPID PANEL
HDL: 44
Total CHOL/HDL Ratio: 8.6
Triglycerides: 514 — ABNORMAL HIGH

## 2010-11-12 LAB — APTT: aPTT: 33

## 2010-11-12 LAB — COMPREHENSIVE METABOLIC PANEL
AST: 33
Albumin: 3.6
Albumin: 4
Alkaline Phosphatase: 32 — ABNORMAL LOW
BUN: 16
Calcium: 9.1
Creatinine, Ser: 0.87
GFR calc Af Amer: >60
Potassium: 4.1
Sodium: 138
Total Protein: 6.1
Total Protein: 6.6

## 2010-11-12 LAB — PROTIME-INR
INR: 2.1 — ABNORMAL HIGH
Prothrombin Time: 24.7 — ABNORMAL HIGH

## 2010-11-12 LAB — URINALYSIS, ROUTINE W REFLEX MICROSCOPIC
Bilirubin Urine: NEGATIVE
Glucose, UA: NEGATIVE
Ketones, ur: NEGATIVE
pH: 5.5

## 2010-11-12 LAB — TROPONIN I
Troponin I: 0.01
Troponin I: 0.01

## 2010-11-12 LAB — HOMOCYSTEINE: Homocysteine: 8.7

## 2010-11-14 ENCOUNTER — Encounter: Payer: Medicare Other | Admitting: *Deleted

## 2010-12-16 ENCOUNTER — Encounter: Payer: Self-pay | Admitting: Cardiology

## 2010-12-16 ENCOUNTER — Ambulatory Visit (INDEPENDENT_AMBULATORY_CARE_PROVIDER_SITE_OTHER): Payer: Medicare Other | Admitting: Cardiology

## 2010-12-16 VITALS — BP 130/80 | HR 70 | Ht 64.0 in | Wt 168.0 lb

## 2010-12-16 DIAGNOSIS — I6350 Cerebral infarction due to unspecified occlusion or stenosis of unspecified cerebral artery: Secondary | ICD-10-CM

## 2010-12-16 DIAGNOSIS — I639 Cerebral infarction, unspecified: Secondary | ICD-10-CM

## 2010-12-16 DIAGNOSIS — Z951 Presence of aortocoronary bypass graft: Secondary | ICD-10-CM

## 2010-12-16 DIAGNOSIS — I4891 Unspecified atrial fibrillation: Secondary | ICD-10-CM

## 2010-12-16 DIAGNOSIS — E039 Hypothyroidism, unspecified: Secondary | ICD-10-CM

## 2010-12-16 DIAGNOSIS — I110 Hypertensive heart disease with heart failure: Secondary | ICD-10-CM

## 2010-12-16 DIAGNOSIS — I5030 Unspecified diastolic (congestive) heart failure: Secondary | ICD-10-CM

## 2010-12-16 DIAGNOSIS — I119 Hypertensive heart disease without heart failure: Secondary | ICD-10-CM

## 2010-12-16 DIAGNOSIS — M109 Gout, unspecified: Secondary | ICD-10-CM

## 2010-12-16 DIAGNOSIS — F329 Major depressive disorder, single episode, unspecified: Secondary | ICD-10-CM

## 2010-12-16 DIAGNOSIS — E876 Hypokalemia: Secondary | ICD-10-CM

## 2010-12-16 LAB — BASIC METABOLIC PANEL
BUN: 15 mg/dL (ref 6–23)
CO2: 29 mEq/L (ref 19–32)
Calcium: 8.8 mg/dL (ref 8.4–10.5)
Creatinine, Ser: 0.9 mg/dL (ref 0.4–1.2)

## 2010-12-16 NOTE — Assessment & Plan Note (Signed)
The patient feels that her memory has definitely improved since starting Aricept.  However she is having vivid nightmares.  She will discuss this with her neurologist Dr. Avie Echevaria.

## 2010-12-16 NOTE — Assessment & Plan Note (Signed)
The patient has had no recurrent angina pectoris.  She has done well following bypass surgery.

## 2010-12-16 NOTE — Patient Instructions (Signed)
Will obtain labs today and call you with results Your physician recommends that you schedule a follow-up appointment in: 3 months

## 2010-12-16 NOTE — Progress Notes (Signed)
Amber [name] Date of Birth:  1934-05-23 [facility] / [facility] [address]   [address] [place], [place]  [address] [phone]           Fax   [phone]  History of Present Illness: This pleasant 76 year old woman is seen for her scheduled 3 month followup office visit.  She has a complex past medical history.  She has known ischemic heart disease she had coronary bypass graft surgery in 2000.  She has a history of high blood pressure.  She has had a previous stroke with residual left hemiparesis.  She's had a history of significant hypercholesterolemia.  Does not tolerate statins will because of fibromyalgia and elevated CK levels.  She has a history of compensated congestive heart failure.  She had atrial fibrillation and was cardioverted in June 2012 about one week later had reverted back to atrial fibrillation.  Current Outpatient Prescriptions  Medication Sig Dispense Refill  . alendronate (FOSAMAX) 70 MG tablet Take 70 mg by mouth every 7 (seven) days. Take with a full glass of water on an empty stomach.       [name] allopurinol (ZYLOPRIM) 300 MG tablet Take 1 tablet (300 mg total) by mouth daily.  90 tablet  3  . amitriptyline (ELAVIL) 100 MG tablet Take 50 mg by mouth at bedtime. 2 at hs      . amLODipine (NORVASC) 5 MG tablet Take 1 tablet (5 mg total) by mouth daily.  90 tablet  3  . celecoxib (CELEBREX) 200 MG capsule Take 200 mg by mouth 2 (two) times daily.        . citalopram (CELEXA) 20 MG tablet        . clopidogrel (PLAVIX) 75 MG tablet Take 75 mg by mouth daily.        [name] DIOVAN 160 MG tablet TAKE 1 TABLET EVERY DAY  90 tablet  4  . donepezil (ARICEPT) 5 MG tablet Take 5 mg by mouth at bedtime as needed.        . famotidine (PEPCID) 20 MG tablet Take 20 mg by mouth daily.        . furosemide (LASIX) 20 MG tablet        . LANOXIN 0.125 MG tablet TAKE 1 TABLET EVERY DAY  90 tablet  6  . levothyroxine (SYNTHROID, LEVOTHROID) 25 MCG tablet Take 1 tablet (25  mcg total) by mouth daily.  90 tablet  3  . metoprolol succinate (TOPROL-XL) 25 MG 24 hr tablet Take 1 tablet (25 mg total) by mouth daily.  90 tablet  3  . potassium chloride SA (K-DUR,KLOR-CON) 20 MEQ tablet Take 20 mEq by mouth daily.        [name] warfarin (COUMADIN) 5 MG tablet Taking 7.5 mg x 2 day 5 mg x 5 days or as directed  110 tablet  3  . DISCONTD: citalopram (CELEXA) 20 MG tablet TAKE 2 TABLETS DAILY  60 tablet  11    Allergies  Allergen Reactions  . Lipitor (Atorvastatin Calcium)   . Mevacor (Lovastatin)   . Pravachol   . Vasotec     Patient Active Problem List  Diagnoses  . Atrial fibrillation  . Unspecified cerebral artery occlusion with cerebral infarction  . Heart failure, diastolic, due to HTN  . Dyslipidemia  . Fibromyalgia  . Insomnia due to mental condition  . Depression  . Cervical spinal stenosis  . Hypothyroidism  . Osteoarthritis  . Osteoporosis  . Anemia  .  Ischemic heart disease  . S/P CABG (coronary artery bypass graft)  . Benign hypertensive heart disease without heart failure  . COPD (chronic obstructive pulmonary disease)  . Diastolic heart failure secondary to hypertension    History  Smoking status  . Former Smoker -- 1.5 packs/day for 25 years  . Types: Cigarettes  . Quit date: 02/10/1977  Smokeless tobacco  . Never Used    History  Alcohol Use No    No family history on file.  Review of Systems: Constitutional: no fever chills diaphoresis or fatigue or change in weight.  Head and neck: no hearing loss, no epistaxis, no photophobia or visual disturbance. Respiratory: No cough, shortness of breath or wheezing. Cardiovascular: No chest pain peripheral edema, palpitations. Gastrointestinal: No abdominal distention, no abdominal pain, no change in bowel habits hematochezia or melena. Genitourinary: No dysuria, no frequency, no urgency, no nocturia. Musculoskeletal:No arthralgias, no back pain, no gait disturbance or  myalgias. Neurological: No dizziness, no headaches, no numbness, no seizures, no syncope, no weakness, no tremors. Hematologic: No lymphadenopathy, no easy bruising. Psychiatric: No confusion, no hallucinations, no sleep disturbance.    Physical Exam: Filed Vitals:   12/16/10 1157  BP: 130/80  Pulse: 70   the general appearance reveals a well-developed well-nourished elderly woman in no distress.Pupils equal and reactive.   Extraocular Movements are full.  There is no scleral icterus.  The mouth and pharynx are normal.  The neck is supple.  The carotids reveal no bruits.  The jugular venous pressure is normal.  The thyroid is not enlarged.  There is no lymphadenopathy.  The chest is clear to percussion and auscultation. There are no rales or rhonchi. Expansion of the chest is symmetrical.  The heart reveals an irregular rhythm and a soft systolic ejection murmur at the base.The abdomen is soft and nontender. Bowel sounds are normal. The liver and spleen are not enlarged. There Are no abdominal masses. There are no bruits.  The pedal pulses are good.  There is no phlebitis or edema.  There is no cyanosis or clubbing.  Her left foot has had a lot of orthopedic procedures and she wears a walking boot.  She was concerned about the circulation of her left foot.  We removed the boot and checked her pedal pulses and they are excellent.The skin is warm and dry.  There is no rash.       Assessment / Plan: Continue same medication.  We're checking a protime and a basal metabolic panel today.  Recheck in 3 months for a followup office visit and EKG

## 2010-12-16 NOTE — Assessment & Plan Note (Signed)
The patient has not been having any recent dyspnea.  She remains on a total of 140 mg of Lasix a day.  She takes 80 in the morning 20 in the late afternoon and 40 in the evening.  On her own she cut back on her potassium repletion to just one a day.  We are checking a basal metabolic panel today.

## 2010-12-16 NOTE — Assessment & Plan Note (Signed)
The patient remains in atrial fibrillation with a controlled ventricular response.  He has not had any TIA or recurrent stroke symptoms

## 2010-12-16 NOTE — Assessment & Plan Note (Signed)
The patient is clinically euthyroid. 

## 2010-12-17 ENCOUNTER — Telehealth: Payer: Self-pay | Admitting: *Deleted

## 2010-12-17 DIAGNOSIS — I4891 Unspecified atrial fibrillation: Secondary | ICD-10-CM

## 2010-12-17 DIAGNOSIS — E876 Hypokalemia: Secondary | ICD-10-CM

## 2010-12-17 DIAGNOSIS — I119 Hypertensive heart disease without heart failure: Secondary | ICD-10-CM

## 2010-12-17 DIAGNOSIS — I639 Cerebral infarction, unspecified: Secondary | ICD-10-CM

## 2010-12-17 MED ORDER — POTASSIUM CHLORIDE CRYS ER 20 MEQ PO TBCR: 20.0000 meq | EXTENDED_RELEASE_TABLET | Freq: Two times a day (BID) | ORAL | Status: DC

## 2010-12-17 NOTE — Telephone Encounter (Signed)
Message copied by Burnell Blanks on Tue Dec 17, 2010 11:52 AM ------      Message from: Cassell Clement      Created: Mon Dec 16, 2010  4:53 PM       Please report.  The potassium is too low.  Increase the potassium pills to twice a day.  The pro time is good at 2.0

## 2010-12-17 NOTE — Telephone Encounter (Signed)
Advised of result, new Rx for potassium sent to [facility]

## 2010-12-25 ENCOUNTER — Telehealth: Payer: Self-pay | Admitting: Cardiology

## 2010-12-25 NOTE — Telephone Encounter (Signed)
Okay to discontinue home oxygen

## 2010-12-25 NOTE — Telephone Encounter (Signed)
Will fax over signed order

## 2010-12-25 NOTE — Telephone Encounter (Signed)
Ok to pick up

## 2010-12-25 NOTE — Telephone Encounter (Signed)
Shreese from [facility] calling stating that pt is asking that [facility] pick up pt oxygen. In order for [facility] to do that they need an order stating that we are d/c her oxygen. Please return call to discuss further and/or fax in d/c of oxygen.   FAX: [phone]

## 2011-01-13 ENCOUNTER — Ambulatory Visit (INDEPENDENT_AMBULATORY_CARE_PROVIDER_SITE_OTHER): Payer: Medicare Other | Admitting: *Deleted

## 2011-01-13 DIAGNOSIS — I6350 Cerebral infarction due to unspecified occlusion or stenosis of unspecified cerebral artery: Secondary | ICD-10-CM

## 2011-01-13 DIAGNOSIS — I4891 Unspecified atrial fibrillation: Secondary | ICD-10-CM

## 2011-01-13 LAB — POCT INR: INR: 2

## 2011-02-10 ENCOUNTER — Ambulatory Visit (INDEPENDENT_AMBULATORY_CARE_PROVIDER_SITE_OTHER): Payer: Medicare Other | Admitting: *Deleted

## 2011-02-10 DIAGNOSIS — I6350 Cerebral infarction due to unspecified occlusion or stenosis of unspecified cerebral artery: Secondary | ICD-10-CM

## 2011-02-10 DIAGNOSIS — I4891 Unspecified atrial fibrillation: Secondary | ICD-10-CM

## 2011-02-10 LAB — POCT INR: INR: 2.4

## 2011-02-12 ENCOUNTER — Other Ambulatory Visit: Payer: Self-pay | Admitting: Cardiology

## 2011-02-12 MED ORDER — CLOPIDOGREL BISULFATE 75 MG PO TABS: 75.0000 mg | ORAL_TABLET | Freq: Every day | ORAL | Status: DC

## 2011-03-10 ENCOUNTER — Ambulatory Visit (INDEPENDENT_AMBULATORY_CARE_PROVIDER_SITE_OTHER): Payer: Medicare Other | Admitting: *Deleted

## 2011-03-10 DIAGNOSIS — I6350 Cerebral infarction due to unspecified occlusion or stenosis of unspecified cerebral artery: Secondary | ICD-10-CM

## 2011-03-10 DIAGNOSIS — I4891 Unspecified atrial fibrillation: Secondary | ICD-10-CM

## 2011-03-10 LAB — POCT INR: INR: 1.8

## 2011-03-18 ENCOUNTER — Ambulatory Visit (INDEPENDENT_AMBULATORY_CARE_PROVIDER_SITE_OTHER): Payer: Medicare Other | Admitting: Cardiology

## 2011-03-18 ENCOUNTER — Encounter: Payer: Self-pay | Admitting: Cardiology

## 2011-03-18 VITALS — BP 132/68 | HR 73 | Resp 18 | Ht 62.0 in | Wt 174.0 lb

## 2011-03-18 DIAGNOSIS — I4891 Unspecified atrial fibrillation: Secondary | ICD-10-CM

## 2011-03-18 DIAGNOSIS — I110 Hypertensive heart disease with heart failure: Secondary | ICD-10-CM

## 2011-03-18 DIAGNOSIS — E785 Hyperlipidemia, unspecified: Secondary | ICD-10-CM

## 2011-03-18 DIAGNOSIS — I5030 Unspecified diastolic (congestive) heart failure: Secondary | ICD-10-CM

## 2011-03-18 DIAGNOSIS — I119 Hypertensive heart disease without heart failure: Secondary | ICD-10-CM

## 2011-03-18 DIAGNOSIS — I509 Heart failure, unspecified: Secondary | ICD-10-CM

## 2011-03-18 DIAGNOSIS — E876 Hypokalemia: Secondary | ICD-10-CM

## 2011-03-18 NOTE — Patient Instructions (Signed)
Will obtain labs today and call you with the results (BMET) Your physician recommends that you continue on your current medications as directed. Please refer to the Current Medication list given to you today. Your physician recommends that you schedule a follow-up appointment in: 3 months with fasting labs (LP/BMET/HFP/TSH) and EKG

## 2011-03-18 NOTE — Progress Notes (Signed)
Amber [name] Date of Birth:  21-Mar-1934 [facility] [address] [place], [place]  [address] [phone]         Fax   [phone]  History of Present Illness: This pleasant 77 year old is seen for a scheduled 3 month followup office visit.  She has a complex past medical history.  She has known ischemic heart disease.  She had coronary artery bypass graft surgery in 2000.  She's had a prior stroke with residual left hemiparesthesias.  She's had a long history of high blood pressure as well as high cholesterol.  She does not tolerate statins because of fibromyalgia and elevated CK levels.  She's had a past history of paroxysmal atrial fibrillation which has now evolved into established atrial fibrillation.  She was cardioverted in June 2012 but her normal sinus rhythm lasted only one week.  She has had a history of compensated congestive heart failure.  Current Outpatient Prescriptions  Medication Sig Dispense Refill  . alendronate (FOSAMAX) 70 MG tablet Take 70 mg by mouth every 7 (seven) days. Take with a full glass of water on an empty stomach.       [name] allopurinol (ZYLOPRIM) 300 MG tablet Take 1 tablet (300 mg total) by mouth daily.  90 tablet  3  . amitriptyline (ELAVIL) 100 MG tablet Take 50 mg by mouth at bedtime. 2 at hs      . amLODipine (NORVASC) 5 MG tablet Take 1 tablet (5 mg total) by mouth daily.  90 tablet  3  . celecoxib (CELEBREX) 200 MG capsule Take 200 mg by mouth 2 (two) times daily.        . citalopram (CELEXA) 20 MG tablet Take 20 mg by mouth daily.       . clopidogrel (PLAVIX) 75 MG tablet Take 1 tablet (75 mg total) by mouth daily.  30 tablet  11  . DIOVAN 160 MG tablet TAKE 1 TABLET EVERY DAY  90 tablet  4  . donepezil (ARICEPT) 5 MG tablet Take 5 mg by mouth at bedtime as needed.        . famotidine (PEPCID) 20 MG tablet Take 20 mg by mouth daily.        . furosemide (LASIX) 20 MG tablet 140 mg daily.       [name] levothyroxine (SYNTHROID,  LEVOTHROID) 25 MCG tablet Take 1 tablet (25 mcg total) by mouth daily.  90 tablet  3  . metoprolol succinate (TOPROL-XL) 25 MG 24 hr tablet Take 1 tablet (25 mg total) by mouth daily.  90 tablet  3  . potassium chloride SA (K-DUR,KLOR-CON) 20 MEQ tablet Take 1 tablet (20 mEq total) by mouth 2 (two) times daily.  180 tablet  3  . warfarin (COUMADIN) 5 MG tablet Taking 7.5 mg x 2 day 5 mg x 5 days or as directed  110 tablet  3  . LANOXIN 0.125 MG tablet TAKE 1 TABLET EVERY DAY  90 tablet  6    Allergies  Allergen Reactions  . Lipitor (Atorvastatin Calcium)   . Mevacor (Lovastatin)   . Pravachol   . Vasotec     Patient Active Problem List  Diagnoses  . Atrial fibrillation  . Unspecified cerebral artery occlusion with cerebral infarction  . Heart failure, diastolic, due to HTN  . Dyslipidemia  . Fibromyalgia  . Insomnia due to mental condition  . Depression  . Cervical spinal stenosis  . Hypothyroidism  . Osteoarthritis  . Osteoporosis  . Anemia  .  Ischemic heart disease  . S/P CABG (coronary artery bypass graft)  . Benign hypertensive heart disease without heart failure  . COPD (chronic obstructive pulmonary disease)  . Diastolic heart failure secondary to hypertension    History  Smoking status  . Former Smoker -- 1.5 packs/day for 25 years  . Types: Cigarettes  . Quit date: 02/10/1977  Smokeless tobacco  . Never Used    History  Alcohol Use No    No family history on file.  Review of Systems: Constitutional: no fever chills diaphoresis or fatigue or change in weight.  Head and neck: no hearing loss, no epistaxis, no photophobia or visual disturbance. Respiratory: No cough, shortness of breath or wheezing. Cardiovascular: No chest pain peripheral edema, palpitations. Gastrointestinal: No abdominal distention, no abdominal pain, no change in bowel habits hematochezia or melena. Genitourinary: No dysuria, no frequency, no urgency, no nocturia. Musculoskeletal:No  arthralgias, no back pain, no gait disturbance or myalgias. Neurological: No dizziness, no headaches, no numbness, no seizures, no syncope, no weakness, no tremors. Hematologic: No lymphadenopathy, no easy bruising. Psychiatric: No confusion, no hallucinations, no sleep disturbance.    Physical Exam: Filed Vitals:   03/18/11 1115  BP: 132/68  Pulse: 73  Resp: 18   the general appearance reveals a well-developed well-nourished woman in no distress.  She still has a residual ecchymosis under the left eye.Pupils equal and reactive.   Extraocular Movements are full.  There is no scleral icterus.  The mouth and pharynx are normal.  The neck is supple.  The carotids reveal no bruits.  The jugular venous pressure is normal.  The thyroid is not enlarged.  There is no lymphadenopathy.  The chest is clear to percussion and auscultation. There are no rales or rhonchi. Expansion of the chest is symmetrical.  Heart reveals a soft systolic ejection murmur at the base.  No diastolic murmur.  No gallop.  No rub.The abdomen is soft and nontender. Bowel sounds are normal. The liver and spleen are not enlarged. There Are no abdominal masses. There are no bruits.  The pedal pulses are good.  There is no phlebitis or edema.  There is no cyanosis or clubbing. Neurologic exam reveals that she has a brace on her left lower leg and foot.  He has residual weakness on her left side as a result of the prior stroke The skin is warm and dry.  There is no rash.   Assessment / Plan: Continue same medication.  We are checking a basal metabolic panel today to be sure that her hypokalemia has been corrected.  Recheck in 3 months for followup office visit EKG and fasting lipid panel hepatic function panel basal metabolic panel and TSH.

## 2011-03-18 NOTE — Assessment & Plan Note (Signed)
The patient has not been expressing any signs or symptoms of excessive fluid retention.  No recent edema.

## 2011-03-18 NOTE — Assessment & Plan Note (Signed)
She remains in atrial fibrillation.  She has not had any TIA symptoms or recurrent stroke.  While vacationing in [place] with her sisters she had 2 falls, one of which necessitated a trip to the emergency room where a CT scan of the head was negative.

## 2011-03-18 NOTE — Assessment & Plan Note (Signed)
Blood pressure has been remaining stable on current therapy.  No headaches or dizzy spells.  While vacationing in [place] she did have one episode during the night where she woke up 2 go to the bathroom and had a white out and fell into the bathtub.  She did not injure herself at that time.

## 2011-03-19 ENCOUNTER — Telehealth: Payer: Self-pay | Admitting: *Deleted

## 2011-03-19 LAB — BASIC METABOLIC PANEL
BUN: 17 mg/dL (ref 6–23)
Creatinine, Ser: 0.8 mg/dL (ref 0.4–1.2)
GFR: 74.44 mL/min (ref >60.00)
Potassium: 3.6 mEq/L (ref 3.5–5.1)

## 2011-03-19 LAB — HEPATIC FUNCTION PANEL
Albumin: 4 g/dL (ref 3.5–5.2)
Alkaline Phosphatase: 41 U/L (ref 39–117)
Total Bilirubin: 0.6 mg/dL (ref 0.3–1.2)

## 2011-03-19 LAB — LIPID PANEL
Cholesterol: 335 mg/dL — ABNORMAL HIGH (ref 0–200)
HDL: 46 mg/dL (ref >39.00)
VLDL: 105.2 mg/dL — ABNORMAL HIGH (ref 0.0–40.0)

## 2011-03-19 LAB — TSH: TSH: 0.9 u[IU]/mL (ref 0.35–5.50)

## 2011-03-19 NOTE — Telephone Encounter (Signed)
Advised of labs 

## 2011-03-19 NOTE — Telephone Encounter (Signed)
Message copied by Burnell Blanks on Wed Mar 19, 2011  4:29 PM ------      Message from: Cassell Clement      Created: Wed Mar 19, 2011  2:19 PM       Kidney function normal.  K is borderline low so increase high potassium foods.      BS is higher--watch carbs.      TSH is normal.      LFTs normal      Cholesterol and TGs are higher.  Try harder with diet since cannot take cholesterol meds.

## 2011-04-07 ENCOUNTER — Ambulatory Visit (INDEPENDENT_AMBULATORY_CARE_PROVIDER_SITE_OTHER): Payer: Medicare Other | Admitting: *Deleted

## 2011-04-07 DIAGNOSIS — I6350 Cerebral infarction due to unspecified occlusion or stenosis of unspecified cerebral artery: Secondary | ICD-10-CM

## 2011-04-07 DIAGNOSIS — I4891 Unspecified atrial fibrillation: Secondary | ICD-10-CM

## 2011-04-21 ENCOUNTER — Ambulatory Visit (INDEPENDENT_AMBULATORY_CARE_PROVIDER_SITE_OTHER): Payer: Medicare Other | Admitting: Pharmacist

## 2011-04-21 DIAGNOSIS — I4891 Unspecified atrial fibrillation: Secondary | ICD-10-CM

## 2011-04-21 DIAGNOSIS — I6350 Cerebral infarction due to unspecified occlusion or stenosis of unspecified cerebral artery: Secondary | ICD-10-CM

## 2011-05-13 ENCOUNTER — Ambulatory Visit (INDEPENDENT_AMBULATORY_CARE_PROVIDER_SITE_OTHER): Payer: Medicare Other | Admitting: Pharmacist

## 2011-05-13 DIAGNOSIS — I6350 Cerebral infarction due to unspecified occlusion or stenosis of unspecified cerebral artery: Secondary | ICD-10-CM

## 2011-05-13 DIAGNOSIS — I4891 Unspecified atrial fibrillation: Secondary | ICD-10-CM

## 2011-06-10 ENCOUNTER — Ambulatory Visit (INDEPENDENT_AMBULATORY_CARE_PROVIDER_SITE_OTHER): Payer: Medicare Other | Admitting: *Deleted

## 2011-06-10 DIAGNOSIS — I4891 Unspecified atrial fibrillation: Secondary | ICD-10-CM

## 2011-06-10 DIAGNOSIS — I6350 Cerebral infarction due to unspecified occlusion or stenosis of unspecified cerebral artery: Secondary | ICD-10-CM

## 2011-06-18 ENCOUNTER — Encounter: Payer: Self-pay | Admitting: Pulmonary Disease

## 2011-06-18 ENCOUNTER — Ambulatory Visit (INDEPENDENT_AMBULATORY_CARE_PROVIDER_SITE_OTHER): Payer: Medicare Other | Admitting: Pulmonary Disease

## 2011-06-18 VITALS — BP 132/68 | HR 80 | Temp 97.8°F | Ht 64.0 in | Wt 175.0 lb

## 2011-06-18 DIAGNOSIS — J449 Chronic obstructive pulmonary disease, unspecified: Secondary | ICD-10-CM

## 2011-06-18 MED ORDER — ALBUTEROL SULFATE HFA 108 (90 BASE) MCG/ACT IN AERS: 2.0000 | INHALATION_SPRAY | Freq: Four times a day (QID) | RESPIRATORY_TRACT | Status: DC | PRN

## 2011-06-18 NOTE — Progress Notes (Signed)
Chief Complaint  Patient presents with  . Follow-up    Pt states her breathing has bene fine. Occasionally feels like she has chest congestion, wheezing and chest tx. denies any cough. pt uses her oxygen and it helps with her symptoms.    History of Present Illness: Amber [name] is a 77 y.o. female former smoker with COPD.  She has been doing well.  She will occasionally use spiriva.  She does not have albuterol.  She does not usually have cough, wheeze, sputum, or chest congestion.  She has home oxygen since she was in hospital.  She does not use this all the time, but does use it occasionally when she has trouble with her breathing.  She also uses it at night sometimes, and this seems to help her sleep.   Past Medical History  Diagnosis Date  . Coronary artery disease   . Atrial fibrillation   . Hypertension   . CVA (cerebral vascular accident)   . Hyperlipidemia   . Hypothyroidism   . Depression   . Insomnia   . Fibromyalgia   . Gout   . Osteoarthritis   . Osteoporosis   . Cervical spinal stenosis   . Nasal polyposis   . Pneumonia     1990  . COPD with emphysema     PFT 05/02/10>>FEV1 1.35(62%), FEV1% 66, DLCO 75%    Past Surgical History  Procedure Date  . Coronary artery bypass graft   . Abdominal hysterectomy   . Breast lumpectomy     Benign lesion    Allergies  Allergen Reactions  . Adhesive (Tape)   . Ceclor (Cefaclor)   . Latex   . Lipitor (Atorvastatin Calcium)   . Mevacor (Lovastatin)   . Pravachol   . Vasotec     Physical Exam:  Blood pressure 132/68, pulse 80, temperature 97.8 F (36.6 C), temperature source Oral, height 5\' 4"  (1.626 m), weight 175 lb (79.379 kg), SpO2 97.00%. Body mass index is 30.04 kg/(m^2). Wt Readings from Last 2 Encounters:  06/18/11 175 lb (79.379 kg)  03/18/11 174 lb (78.926 kg)    General - Thin, healthy, no distress  HEENT - no sinus tenderness, no oral exudate, no LAN  Cardiac - irregular, no murmur  Chest -  prolonged exhalation, no wheeze/rales  Abd - soft, non-tender  Ext - no edema  Neuro - normal strength, CN intact, A & O x 3  Psych - normal mood and behavior   Assessment/Plan:  Outpatient Encounter Prescriptions as of 06/18/2011  Medication Sig Dispense Refill  . alendronate (FOSAMAX) 70 MG tablet Take 70 mg by mouth every 7 (seven) days. Take with a full glass of water on an empty stomach.       [name] allopurinol (ZYLOPRIM) 300 MG tablet Take 1 tablet (300 mg total) by mouth daily.  90 tablet  3  . amitriptyline (ELAVIL) 100 MG tablet Take 50 mg by mouth at bedtime. 2 at hs      . amLODipine (NORVASC) 5 MG tablet Take 1 tablet (5 mg total) by mouth daily.  90 tablet  3  . celecoxib (CELEBREX) 200 MG capsule Take 200 mg by mouth 2 (two) times daily.        . citalopram (CELEXA) 20 MG tablet Take 20 mg by mouth daily.       . clopidogrel (PLAVIX) 75 MG tablet Take 1 tablet (75 mg total) by mouth daily.  30 tablet  11  . DIOVAN 160 MG  tablet TAKE 1 TABLET EVERY DAY  90 tablet  4  . donepezil (ARICEPT) 5 MG tablet Take 5 mg by mouth at bedtime as needed.        . famotidine (PEPCID) 20 MG tablet Take 20 mg by mouth daily.        . furosemide (LASIX) 20 MG tablet 140 mg daily.       [name] LANOXIN 0.125 MG tablet TAKE 1 TABLET EVERY DAY  90 tablet  6  . levothyroxine (SYNTHROID, LEVOTHROID) 25 MCG tablet Take 1 tablet (25 mcg total) by mouth daily.  90 tablet  3  . metoprolol succinate (TOPROL-XL) 25 MG 24 hr tablet Take 1 tablet (25 mg total) by mouth daily.  90 tablet  3  . potassium chloride SA (K-DUR,KLOR-CON) 20 MEQ tablet Take 1 tablet (20 mEq total) by mouth 2 (two) times daily.  180 tablet  3  . tiotropium (SPIRIVA) 18 MCG inhalation capsule Place 18 mcg into inhaler and inhale as needed.      . warfarin (COUMADIN) 5 MG tablet Taking 7.5 mg x 2 day 5 mg x 5 days or as directed  110 tablet  3    Soham Hollett Pager:  [phone] 06/18/2011, 3:43 PM

## 2011-06-18 NOTE — Progress Notes (Signed)
Addended by: Nita Sells on: 06/18/2011 04:10 PM   Modules accepted: Orders

## 2011-06-18 NOTE — Patient Instructions (Signed)
Will arrange for overnight oxygen test and call with results Proair two puffs as needed for cough, wheeze, or chest congestion Follow up in one year

## 2011-06-18 NOTE — Assessment & Plan Note (Addendum)
She is doing well.  Will prescribe proair as rescue inhaler.  Advised her that spiriva is not intended to be used as needed.  She has oxygen set up at home, but is not sure if she needs this still.  Will arrange for ONO on room air to further assess.

## 2011-06-19 ENCOUNTER — Ambulatory Visit (INDEPENDENT_AMBULATORY_CARE_PROVIDER_SITE_OTHER): Payer: Medicare Other | Admitting: Cardiology

## 2011-06-19 ENCOUNTER — Encounter: Payer: Self-pay | Admitting: Cardiology

## 2011-06-19 ENCOUNTER — Other Ambulatory Visit (INDEPENDENT_AMBULATORY_CARE_PROVIDER_SITE_OTHER): Payer: Medicare Other

## 2011-06-19 VITALS — BP 120/74 | HR 66 | Ht 64.0 in | Wt 168.0 lb

## 2011-06-19 DIAGNOSIS — R7309 Other abnormal glucose: Secondary | ICD-10-CM

## 2011-06-19 DIAGNOSIS — I4891 Unspecified atrial fibrillation: Secondary | ICD-10-CM

## 2011-06-19 DIAGNOSIS — I6350 Cerebral infarction due to unspecified occlusion or stenosis of unspecified cerebral artery: Secondary | ICD-10-CM

## 2011-06-19 DIAGNOSIS — E039 Hypothyroidism, unspecified: Secondary | ICD-10-CM

## 2011-06-19 DIAGNOSIS — R739 Hyperglycemia, unspecified: Secondary | ICD-10-CM

## 2011-06-19 DIAGNOSIS — I5030 Unspecified diastolic (congestive) heart failure: Secondary | ICD-10-CM

## 2011-06-19 DIAGNOSIS — I110 Hypertensive heart disease with heart failure: Secondary | ICD-10-CM

## 2011-06-19 DIAGNOSIS — I509 Heart failure, unspecified: Secondary | ICD-10-CM

## 2011-06-19 DIAGNOSIS — I119 Hypertensive heart disease without heart failure: Secondary | ICD-10-CM

## 2011-06-19 DIAGNOSIS — I639 Cerebral infarction, unspecified: Secondary | ICD-10-CM

## 2011-06-19 LAB — HEPATIC FUNCTION PANEL
ALT: 23 U/L (ref 0–35)
AST: 34 U/L (ref 0–37)
Albumin: 4.2 g/dL (ref 3.5–5.2)
Alkaline Phosphatase: 38 U/L — ABNORMAL LOW (ref 39–117)
Total Protein: 7.1 g/dL (ref 6.0–8.3)

## 2011-06-19 LAB — BASIC METABOLIC PANEL
CO2: 28 mEq/L (ref 19–32)
Glucose, Bld: 104 mg/dL — ABNORMAL HIGH (ref 70–99)
Potassium: 3.9 mEq/L (ref 3.5–5.1)
Sodium: 140 mEq/L (ref 135–145)

## 2011-06-19 NOTE — Assessment & Plan Note (Signed)
Her atrial fibrillation has been well controlled on current medication.  She's not having any palpitations or tachycardia.  She has had no TIA symptoms.  She's had no problems with her Coumadin which has remained under good control and her INR last week was 2.3

## 2011-06-19 NOTE — Progress Notes (Signed)
Amber [name] Date of Birth:  07/26/1934 [facility] [address] [place], [place]  [address] [phone]         Fax   [phone]  History of Present Illness: This pleasant 77 year old woman is seen for a scheduled 3 month followup office visit.  She has a complex past medical history.  She has a history of known ischemic heart disease with coronary artery bypass graft surgery in 2000.  She has had a prior stroke with residual left hemiparesis.  She's had a long history of high blood pressure as well as high cholesterol.  She does not tolerate statins because of elevated CK levels and fibromyalgia.  She has established atrial fibrillation.  She was cardioverted in June 2012 but remained in sinus rhythm for less than one week.  She's had compensated congestive heart failure secondary to diastolic dysfunction  Current Outpatient Prescriptions  Medication Sig Dispense Refill  . albuterol (PROAIR HFA) 108 (90 BASE) MCG/ACT inhaler Inhale 2 puffs into the lungs every 6 (six) hours as needed for wheezing.  1 Inhaler  5  . alendronate (FOSAMAX) 70 MG tablet Take 70 mg by mouth every 7 (seven) days. Take with a full glass of water on an empty stomach.       [name] allopurinol (ZYLOPRIM) 300 MG tablet Take 1 tablet (300 mg total) by mouth daily.  90 tablet  3  . amitriptyline (ELAVIL) 100 MG tablet Take 50 mg by mouth at bedtime. 2 at hs      . amLODipine (NORVASC) 5 MG tablet Take 1 tablet (5 mg total) by mouth daily.  90 tablet  3  . celecoxib (CELEBREX) 200 MG capsule Take 200 mg by mouth 2 (two) times daily.        . citalopram (CELEXA) 20 MG tablet Take 20 mg by mouth daily.       . clopidogrel (PLAVIX) 75 MG tablet Take 1 tablet (75 mg total) by mouth daily.  30 tablet  11  . DIOVAN 160 MG tablet TAKE 1 TABLET EVERY DAY  90 tablet  4  . donepezil (ARICEPT) 5 MG tablet Take 5 mg by mouth at bedtime as needed.        . famotidine (PEPCID) 20 MG tablet Take 20 mg by mouth  daily.        . furosemide (LASIX) 20 MG tablet 140 mg daily.       [name] LANOXIN 0.125 MG tablet TAKE 1 TABLET EVERY DAY  90 tablet  6  . levothyroxine (SYNTHROID, LEVOTHROID) 25 MCG tablet Take 1 tablet (25 mcg total) by mouth daily.  90 tablet  3  . metoprolol succinate (TOPROL-XL) 25 MG 24 hr tablet Take 1 tablet (25 mg total) by mouth daily.  90 tablet  3  . potassium chloride SA (K-DUR,KLOR-CON) 20 MEQ tablet Take 1 tablet (20 mEq total) by mouth 2 (two) times daily.  180 tablet  3  . tiotropium (SPIRIVA) 18 MCG inhalation capsule Place 18 mcg into inhaler and inhale as needed.      . warfarin (COUMADIN) 5 MG tablet Taking 7.5 mg x 2 day 5 mg x 5 days or as directed  110 tablet  3    Allergies  Allergen Reactions  . Adhesive (Tape)   . Ceclor (Cefaclor)   . Latex   . Lipitor (Atorvastatin Calcium)   . Mevacor (Lovastatin)   . Pravachol   . Vasotec     Patient Active Problem List  Diagnoses  . Atrial fibrillation  . Unspecified cerebral artery occlusion with cerebral infarction  . Heart failure, diastolic, due to HTN  . Dyslipidemia  . Fibromyalgia  . Insomnia due to mental condition  . Depression  . Cervical spinal stenosis  . Hypothyroidism  . Osteoarthritis  . Osteoporosis  . Anemia  . Ischemic heart disease  . S/P CABG (coronary artery bypass graft)  . Benign hypertensive heart disease without heart failure  . COPD (chronic obstructive pulmonary disease)  . Diastolic heart failure secondary to hypertension    History  Smoking status  . Former Smoker -- 1.5 packs/day for 25 years  . Types: Cigarettes  . Quit date: 02/10/1977  Smokeless tobacco  . Never Used    History  Alcohol Use No    No family history on file.  Review of Systems: Constitutional: no fever chills diaphoresis or fatigue or change in weight.  Head and neck: no hearing loss, no epistaxis, no photophobia or visual disturbance. Respiratory: No cough, shortness of breath or  wheezing. Cardiovascular: No chest pain peripheral edema, palpitations. Gastrointestinal: No abdominal distention, no abdominal pain, no change in bowel habits hematochezia or melena. Genitourinary: No dysuria, no frequency, no urgency, no nocturia. Musculoskeletal:No arthralgias, no back pain, no gait disturbance or myalgias. Neurological: No dizziness, no headaches, no numbness, no seizures, no syncope, no weakness, no tremors. Hematologic: No lymphadenopathy, no easy bruising. Psychiatric: No confusion, no hallucinations, no sleep disturbance.    Physical Exam: Filed Vitals:   06/19/11 1014  BP: 120/74  Pulse: 66   the general appearance reveals a well-developed well-nourished woman in no distress.Pupils equal and reactive.   Extraocular Movements are full.  There is no scleral icterus.  The mouth and pharynx are normal.  The neck is supple.  The carotids reveal no bruits.  The jugular venous pressure is normal.  The thyroid is not enlarged.  There is no lymphadenopathy.  The chest is clear to percussion and auscultation. There are no rales or rhonchi. Expansion of the chest is symmetrical.  The heart is irregular in atrial fibrillation The abdomen is soft and nontender. Bowel sounds are normal. The liver and spleen are not enlarged. There Are no abdominal masses. There are no bruits.  Extremities reveal no edema of the right foot.  Her left leg is in a brace and she does have chronic edema of the left lower leg and foot.  Her last exam reveals residual of previous stroke.The skin is warm and dry.  There is no rash.  EKG shows atrial fibrillation with a controlled ventricular response and nonspecific ST-T wave changes   Assessment / Plan:  Continue same medication.  We gave her a diabetic diet to go by today to help her mild elevation of blood sugar.  Recheck in 3 months for followup office visit lipid panel hepatic function panel and basal metabolic panel.

## 2011-06-19 NOTE — Patient Instructions (Signed)
Your physician recommends that you continue on your current medications as directed. Please refer to the Current Medication list given to you today.   Your physician recommends that you schedule a follow-up appointment in: 3 months with fasting labs

## 2011-06-19 NOTE — Assessment & Plan Note (Signed)
The patient is not having any orthopnea or paroxysmal nocturnal dyspnea. 

## 2011-06-19 NOTE — Assessment & Plan Note (Signed)
Blood pressure has been remaining stable on current therapy.  No headaches or dizzy spells.  She has intentionally lost 6 pounds since last visit.  She is concerned about her borderline blood sugar elevation

## 2011-06-20 ENCOUNTER — Telehealth: Payer: Self-pay | Admitting: *Deleted

## 2011-06-20 NOTE — Telephone Encounter (Signed)
Message copied by Burnell Blanks on Fri Jun 20, 2011 12:20 PM ------      Message from: Cassell Clement      Created: Thu Jun 19, 2011  4:39 PM       Please report. Lytes are good. Blood sugar is better 104. LFTs are normal. TG are better.  Total cholesterol about the same although LDL is higher this time.  Continue same careful diet and weight loss.

## 2011-06-20 NOTE — Telephone Encounter (Signed)
Advised of labs 

## 2011-06-25 ENCOUNTER — Other Ambulatory Visit: Payer: Self-pay | Admitting: Cardiology

## 2011-06-25 ENCOUNTER — Telehealth: Payer: Self-pay | Admitting: Pulmonary Disease

## 2011-06-25 DIAGNOSIS — J449 Chronic obstructive pulmonary disease, unspecified: Secondary | ICD-10-CM

## 2011-06-25 NOTE — Telephone Encounter (Signed)
Room air ONO 06/20/11>>Test time 6 hrs.  SpO2 mean 93.7%, SpO2 low 80%.  Spent 28 sec with SpO2 < 88%.  Will have my nurse inform patient that ONO looked good.  She no longer needs to use oxygen at night.

## 2011-06-26 NOTE — Telephone Encounter (Signed)
LMOMTCB X1 for pt 

## 2011-06-30 NOTE — Telephone Encounter (Signed)
I spoke with patient about results and she verbalized understanding and had no questions 

## 2011-07-08 ENCOUNTER — Ambulatory Visit (INDEPENDENT_AMBULATORY_CARE_PROVIDER_SITE_OTHER): Payer: Medicare Other | Admitting: *Deleted

## 2011-07-08 ENCOUNTER — Other Ambulatory Visit: Payer: Self-pay | Admitting: Cardiology

## 2011-07-08 DIAGNOSIS — I6350 Cerebral infarction due to unspecified occlusion or stenosis of unspecified cerebral artery: Secondary | ICD-10-CM

## 2011-07-08 DIAGNOSIS — I4891 Unspecified atrial fibrillation: Secondary | ICD-10-CM

## 2011-07-08 LAB — POCT INR: INR: 2.8

## 2011-08-16 ENCOUNTER — Other Ambulatory Visit: Payer: Self-pay | Admitting: Cardiology

## 2011-08-18 NOTE — Telephone Encounter (Signed)
Refilled diovan and coumadin

## 2011-08-19 ENCOUNTER — Ambulatory Visit (INDEPENDENT_AMBULATORY_CARE_PROVIDER_SITE_OTHER): Payer: Medicare Other | Admitting: Pharmacist

## 2011-08-19 DIAGNOSIS — I4891 Unspecified atrial fibrillation: Secondary | ICD-10-CM

## 2011-08-19 DIAGNOSIS — I6350 Cerebral infarction due to unspecified occlusion or stenosis of unspecified cerebral artery: Secondary | ICD-10-CM

## 2011-08-19 LAB — POCT INR: INR: 2.1

## 2011-09-02 ENCOUNTER — Other Ambulatory Visit: Payer: Self-pay | Admitting: Cardiology

## 2011-09-07 ENCOUNTER — Emergency Department (HOSPITAL_COMMUNITY): Payer: Medicare Other

## 2011-09-07 ENCOUNTER — Encounter (HOSPITAL_COMMUNITY): Payer: Self-pay | Admitting: *Deleted

## 2011-09-07 ENCOUNTER — Emergency Department (HOSPITAL_COMMUNITY)
Admission: EM | Admit: 2011-09-07 | Discharge: 2011-09-08 | Disposition: A | Discharge status: Home / Self Care | Payer: Medicare Other | Attending: Emergency Medicine | Admitting: Emergency Medicine

## 2011-09-07 DIAGNOSIS — S0003XA Contusion of scalp, initial encounter: Secondary | ICD-10-CM | POA: Insufficient documentation

## 2011-09-07 DIAGNOSIS — IMO0002 Reserved for concepts with insufficient information to code with codable children: Secondary | ICD-10-CM | POA: Insufficient documentation

## 2011-09-07 DIAGNOSIS — Z87891 Personal history of nicotine dependence: Secondary | ICD-10-CM | POA: Insufficient documentation

## 2011-09-07 DIAGNOSIS — Z79899 Other long term (current) drug therapy: Secondary | ICD-10-CM | POA: Insufficient documentation

## 2011-09-07 DIAGNOSIS — I4891 Unspecified atrial fibrillation: Secondary | ICD-10-CM | POA: Insufficient documentation

## 2011-09-07 DIAGNOSIS — S0001XA Abrasion of scalp, initial encounter: Secondary | ICD-10-CM

## 2011-09-07 DIAGNOSIS — W19XXXA Unspecified fall, initial encounter: Secondary | ICD-10-CM

## 2011-09-07 DIAGNOSIS — I2510 Atherosclerotic heart disease of native coronary artery without angina pectoris: Secondary | ICD-10-CM | POA: Insufficient documentation

## 2011-09-07 DIAGNOSIS — Z8673 Personal history of transient ischemic attack (TIA), and cerebral infarction without residual deficits: Secondary | ICD-10-CM | POA: Insufficient documentation

## 2011-09-07 DIAGNOSIS — J4489 Other specified chronic obstructive pulmonary disease: Secondary | ICD-10-CM | POA: Insufficient documentation

## 2011-09-07 DIAGNOSIS — W108XXA Fall (on) (from) other stairs and steps, initial encounter: Secondary | ICD-10-CM | POA: Insufficient documentation

## 2011-09-07 DIAGNOSIS — Y92009 Unspecified place in unspecified non-institutional (private) residence as the place of occurrence of the external cause: Secondary | ICD-10-CM | POA: Insufficient documentation

## 2011-09-07 DIAGNOSIS — Z951 Presence of aortocoronary bypass graft: Secondary | ICD-10-CM | POA: Insufficient documentation

## 2011-09-07 DIAGNOSIS — IMO0001 Reserved for inherently not codable concepts without codable children: Secondary | ICD-10-CM | POA: Insufficient documentation

## 2011-09-07 DIAGNOSIS — J449 Chronic obstructive pulmonary disease, unspecified: Secondary | ICD-10-CM | POA: Insufficient documentation

## 2011-09-07 DIAGNOSIS — M810 Age-related osteoporosis without current pathological fracture: Secondary | ICD-10-CM | POA: Insufficient documentation

## 2011-09-07 LAB — POCT I-STAT, CHEM 8
BUN: 21 mg/dL (ref 6–23)
Calcium, Ion: 1.14 mmol/L (ref 1.13–1.30)
TCO2: 29 mmol/L (ref 0–100)

## 2011-09-07 LAB — CBC WITH DIFFERENTIAL/PLATELET
Basophils Absolute: 0.1 10*3/uL (ref 0.0–0.1)
Basophils Relative: 1 % (ref 0–1)
Lymphocytes Relative: 27 % (ref 12–46)
MCHC: 33.6 g/dL (ref 30.0–36.0)
Neutro Abs: 5.2 10*3/uL (ref 1.7–7.7)
Platelets: 266 10*3/uL (ref 150–400)
RDW: 14.7 % (ref 11.5–15.5)
WBC: 8.3 10*3/uL (ref 4.0–10.5)

## 2011-09-07 LAB — PROTIME-INR
INR: 1.53 — ABNORMAL HIGH (ref 0.00–1.49)
Prothrombin Time: 18.7 seconds — ABNORMAL HIGH (ref 11.6–15.2)

## 2011-09-07 NOTE — ED Notes (Signed)
Patient reports she fell today approx 20 to 30 min prior to arrival.  She has lac to the back of her head.  Patient is on coumadin.  Patient complains of headache, neck pain on left and right shoulder pain.  She is alert and oriented at this time.  She has hx of left sided weakness related to cva.  She states she tried to go up steps alone today,  She fell down 3 steps.  She states she hit her buttocks and then hit her head.   She states she heard her head crack

## 2011-09-08 ENCOUNTER — Telehealth: Payer: Self-pay | Admitting: Cardiology

## 2011-09-08 NOTE — Telephone Encounter (Signed)
New Problem:    Patient called in because she fell, hit her head, and had to go to the ER.  At the ER the attending physician recommended that she see her Cardiologist today to recheck her INR levels and for an overall examination.  Please call back

## 2011-09-08 NOTE — ED Provider Notes (Signed)
History     CSN: [id number]  Arrival date & time 09/07/11  1803   First MD Initiated Contact with Patient 09/07/11 2241      Chief Complaint  Patient presents with  . Fall  . Head Injury    (Consider location/radiation/quality/duration/timing/severity/associated sxs/prior treatment) HPI 77 year old female presents to emergency department complaining of head injury. Patient reports that she has unsteadiness since having a stroke. She is supposed to use a walker and a leg brace, but was walking over to her daughter's house next door without either when she stumbled going up some stairs. Patient fell backwards and initially striking her bottom and then her head. She denies LOC. She reports that she had bleeding from a wound on her head. No nausea no vomiting no headache. Patient reports some soreness to her right buttock where she hit, and generalized aching. Patient is on Coumadin for history of thrombotic strokes and A. fib. Patient reports her INR was 2, 2 weeks ago. Past Medical History  Diagnosis Date  . Coronary artery disease   . Atrial fibrillation   . Hypertension   . CVA (cerebral vascular accident)   . Hyperlipidemia   . Hypothyroidism   . Depression   . Insomnia   . Fibromyalgia   . Gout   . Osteoarthritis   . Osteoporosis   . Cervical spinal stenosis   . Nasal polyposis   . Pneumonia     1990  . COPD with emphysema     PFT 05/02/10>>FEV1 1.35(62%), FEV1% 66, DLCO 75%    Past Surgical History  Procedure Date  . Coronary artery bypass graft   . Abdominal hysterectomy   . Breast lumpectomy     Benign lesion    No family history on file.  History  Substance Use Topics  . Smoking status: Former Smoker -- 1.5 packs/day for 25 years    Types: Cigarettes    Quit date: 02/10/1977  . Smokeless tobacco: Never Used  . Alcohol Use: No    OB History    Grav Para Term Preterm Abortions TAB SAB Ect Mult Living                  Review of Systems  All other  systems reviewed and are negative.    Allergies  Adhesive; Ceclor; Latex; Lipitor; Mevacor; Pravachol; and Vasotec  Home Medications   Current Outpatient Rx  Name Route Sig Dispense Refill  . ALBUTEROL SULFATE HFA 108 (90 BASE) MCG/ACT IN AERS Inhalation Inhale 2 puffs into the lungs every 6 (six) hours as needed. For shortness of breath    . ALENDRONATE SODIUM 70 MG PO TABS Oral Take 70 mg by mouth every 7 (seven) days. Take with a full glass of water on an empty stomach.     . ALLOPURINOL 300 MG PO TABS Oral Take 1 tablet (300 mg total) by mouth daily. 90 tablet 3  . AMITRIPTYLINE HCL 100 MG PO TABS Oral Take 50 mg by mouth at bedtime.     [name] AMLODIPINE BESYLATE 5 MG PO TABS Oral Take 5 mg by mouth daily.    . CELECOXIB 200 MG PO CAPS Oral Take 200 mg by mouth 2 (two) times daily.      [name] CITALOPRAM HYDROBROMIDE 20 MG PO TABS Oral Take 20 mg by mouth daily.     [name] CLOPIDOGREL BISULFATE 75 MG PO TABS Oral Take 1 tablet (75 mg total) by mouth daily. 30 tablet 11  . DIGOXIN 0.125 MG  PO TABS Oral Take 0.125 mg by mouth daily.    . DONEPEZIL HCL 5 MG PO TABS Oral Take 5 mg by mouth at bedtime as needed. For memory    . FAMOTIDINE 20 MG PO TABS Oral Take 20 mg by mouth daily.      . FUROSEMIDE 20 MG PO TABS Oral Take 60-80 mg by mouth 2 (two) times daily. Take 80mg  in the AM and 60mg  in the PM    . LEVOTHYROXINE SODIUM 25 MCG PO TABS Oral Take 25 mcg by mouth daily.    [name] METOPROLOL SUCCINATE ER 25 MG PO TB24 Oral Take 25 mg by mouth daily.    [name] POTASSIUM CHLORIDE CRYS ER 20 MEQ PO TBCR Oral Take 1 tablet (20 mEq total) by mouth 2 (two) times daily. 180 tablet 3  . VALSARTAN 160 MG PO TABS Oral Take 160 mg by mouth daily.    . WARFARIN SODIUM 5 MG PO TABS Oral Take 5-7.5 mg by mouth daily. Take 7.5mg  on Sun,Wed,and Fri. Take 5mg  all other days      BP 145/73  Pulse 69  Temp 97.9 F (36.6 C) (Oral)  Resp 18  SpO2 97%  Physical Exam  Nursing note and vitals reviewed. Constitutional: She  is oriented to person, place, and time. She appears well-developed and well-nourished. No distress.  HENT:  Head: Normocephalic.  Right Ear: External ear normal.  Left Ear: External ear normal.  Nose: Nose normal.  Mouth/Throat: Oropharynx is clear and moist. No oropharyngeal exudate.       Contusion/abrasion to right posterior head without active bleeding  Eyes: Conjunctivae and EOM are normal. Pupils are equal, round, and reactive to light. Right eye exhibits no discharge. Left eye exhibits no discharge.  Neck: Normal range of motion. Neck supple. No JVD present. No tracheal deviation present. No thyromegaly present.       Mild soreness with palpation without stepoff or crepitus  Cardiovascular: Normal rate, normal heart sounds and intact distal pulses.  Exam reveals no gallop and no friction rub.   No murmur heard.      irregular  Pulmonary/Chest: Effort normal and breath sounds normal. No stridor. No respiratory distress. She has no wheezes. She has no rales. She exhibits no tenderness.  Abdominal: Soft. Bowel sounds are normal. She exhibits no distension and no mass. There is no tenderness. There is no rebound and no guarding.  Musculoskeletal: Normal range of motion. She exhibits no edema and no tenderness.  Lymphadenopathy:    She has no cervical adenopathy.  Neurological: She is alert and oriented to person, place, and time. No cranial nerve deficit. She exhibits normal muscle tone. Coordination normal.  Skin: Skin is warm and dry. No rash noted. No erythema. No pallor.    ED Course  Procedures (including critical care time)  Labs Reviewed  PROTIME-INR - Abnormal; Notable for the following:    Prothrombin Time 18.7 (*)     INR 1.53 (*)     All other components within normal limits  POCT I-STAT, CHEM 8 - Abnormal; Notable for the following:    Glucose, Bld 101 (*)     Hemoglobin 16.0 (*)     HCT 47.0 (*)     All other components within normal limits  CBC WITH DIFFERENTIAL     Ct Head Wo Contrast  09/07/2011  *RADIOLOGY REPORT*  Clinical Data:  Fall  CT HEAD WITHOUT CONTRAST CT CERVICAL SPINE WITHOUT CONTRAST  Technique:  Multidetector CT imaging  of the head and cervical spine was performed following the standard protocol without intravenous contrast.  Multiplanar CT image reconstructions of the cervical spine were also generated.  Comparison:  12/07/2007 and brain MRI 10/09/2010  CT HEAD  Findings: No skull fracture is noted.  There is scalp swelling and subcutaneous stranding with small subcutaneous hematoma in the right posterior parietal region.  No intracranial hemorrhage, mass effect or midline shift. Stable cerebral atrophy.  No acute infarction.  No mass lesion is noted on this unenhanced scan.  Paranasal sinuses and mastoid air cells are unremarkable.  IMPRESSION: No acute intracranial abnormality.  There is scalp swelling subcutaneous stranding and small subcutaneous hematoma in the right posterior parietal region.  Stable cerebral atrophy.  CT CERVICAL SPINE  Findings: Axial images of the cervical spine shows no acute fracture or subluxation.  Extensive degenerative changes C1-C2 articulation again noted. Chronic reversal of the cervical lordosis.  Computer processed images shows no acute fracture or subluxation.  Multilevel facet degenerative changes again noted. There is disc space flattening with mild anterior spurring at C5-C6 C6-C7 and C 7-T1 level.  No prevertebral soft tissue swelling. Cervical airway is patent.  There is no pneumothorax in visualized lung apices.  Coronal images shows no acute fracture or subluxation.  IMPRESSION: No acute fracture or subluxation.  Degenerative changes as described above.  Original Report Authenticated By: Natasha Mead, M.D.   Ct Cervical Spine Wo Contrast  09/07/2011  *RADIOLOGY REPORT*  Clinical Data:  Fall  CT HEAD WITHOUT CONTRAST CT CERVICAL SPINE WITHOUT CONTRAST  Technique:  Multidetector CT imaging of the head and cervical  spine was performed following the standard protocol without intravenous contrast.  Multiplanar CT image reconstructions of the cervical spine were also generated.  Comparison:  12/07/2007 and brain MRI 10/09/2010  CT HEAD  Findings: No skull fracture is noted.  There is scalp swelling and subcutaneous stranding with small subcutaneous hematoma in the right posterior parietal region.  No intracranial hemorrhage, mass effect or midline shift. Stable cerebral atrophy.  No acute infarction.  No mass lesion is noted on this unenhanced scan.  Paranasal sinuses and mastoid air cells are unremarkable.  IMPRESSION: No acute intracranial abnormality.  There is scalp swelling subcutaneous stranding and small subcutaneous hematoma in the right posterior parietal region.  Stable cerebral atrophy.  CT CERVICAL SPINE  Findings: Axial images of the cervical spine shows no acute fracture or subluxation.  Extensive degenerative changes C1-C2 articulation again noted. Chronic reversal of the cervical lordosis.  Computer processed images shows no acute fracture or subluxation.  Multilevel facet degenerative changes again noted. There is disc space flattening with mild anterior spurring at C5-C6 C6-C7 and C 7-T1 level.  No prevertebral soft tissue swelling. Cervical airway is patent.  There is no pneumothorax in visualized lung apices.  Coronal images shows no acute fracture or subluxation.  IMPRESSION: No acute fracture or subluxation.  Degenerative changes as described above.  Original Report Authenticated By: Natasha Mead, M.D.     1. Fall   2. Scalp contusion   3. Scalp abrasion       MDM  77 year old female your fall with head injury. Patient is on Coumadin, but INR today is 1.53. She has not had LOC. CT scan without intracranial injury or bleeding. The patient is safe to go home, we'll contact [facility] on-call for her clinic doctor and have them see her tomorrow. Will have patient hold her Coumadin for today until seen by  her doctor  Olivia Mackie, MD 09/08/11 [phone]

## 2011-09-08 NOTE — Telephone Encounter (Signed)
After reviewing discharge summ with CVRR, pt instructed to see her primary care to make sure her scalp wound is okay to restart the coumadin, she does not need a protime prior to restarting coumadin. Can restart if PCP thinks okay.

## 2011-09-08 NOTE — Telephone Encounter (Signed)
Spoke with pt, she feels fine today after her fall. She does not feel she needs to be seen but she was told not to take any coumadin until we check a protime. She has a follow up appt with [facility] on 09-18-11. Will discuss with CVRR.

## 2011-09-08 NOTE — ED Notes (Signed)
Patient is alert and oriented x4.  Patient is being discharged home and her daughter is transporting her.  Patient has a pain level of two and says it is tolerable.  Patient verbalized understanding of instructions.

## 2011-09-18 ENCOUNTER — Ambulatory Visit (INDEPENDENT_AMBULATORY_CARE_PROVIDER_SITE_OTHER): Payer: Medicare Other | Admitting: Pharmacist

## 2011-09-18 ENCOUNTER — Ambulatory Visit (INDEPENDENT_AMBULATORY_CARE_PROVIDER_SITE_OTHER): Payer: Medicare Other | Admitting: Cardiology

## 2011-09-18 ENCOUNTER — Ambulatory Visit (INDEPENDENT_AMBULATORY_CARE_PROVIDER_SITE_OTHER): Payer: Medicare Other | Admitting: *Deleted

## 2011-09-18 ENCOUNTER — Encounter: Payer: Self-pay | Admitting: Cardiology

## 2011-09-18 VITALS — BP 130/78 | HR 78 | Ht 64.0 in | Wt 168.0 lb

## 2011-09-18 DIAGNOSIS — I5030 Unspecified diastolic (congestive) heart failure: Secondary | ICD-10-CM

## 2011-09-18 DIAGNOSIS — I4891 Unspecified atrial fibrillation: Secondary | ICD-10-CM

## 2011-09-18 DIAGNOSIS — I110 Hypertensive heart disease with heart failure: Secondary | ICD-10-CM

## 2011-09-18 DIAGNOSIS — I119 Hypertensive heart disease without heart failure: Secondary | ICD-10-CM

## 2011-09-18 DIAGNOSIS — I259 Chronic ischemic heart disease, unspecified: Secondary | ICD-10-CM

## 2011-09-18 DIAGNOSIS — I509 Heart failure, unspecified: Secondary | ICD-10-CM

## 2011-09-18 DIAGNOSIS — I6350 Cerebral infarction due to unspecified occlusion or stenosis of unspecified cerebral artery: Secondary | ICD-10-CM

## 2011-09-18 DIAGNOSIS — M1990 Unspecified osteoarthritis, unspecified site: Secondary | ICD-10-CM

## 2011-09-18 LAB — HEPATIC FUNCTION PANEL
ALT: 24 U/L (ref 0–35)
AST: 37 U/L (ref 0–37)
Albumin: 4.4 g/dL (ref 3.5–5.2)

## 2011-09-18 LAB — POCT INR: INR: 1.5

## 2011-09-18 LAB — LIPID PANEL
Cholesterol: 334 mg/dL — ABNORMAL HIGH (ref 0–200)
VLDL: 72 mg/dL — ABNORMAL HIGH (ref 0.0–40.0)

## 2011-09-18 LAB — BASIC METABOLIC PANEL
BUN: 12 mg/dL (ref 6–23)
CO2: 27 mEq/L (ref 19–32)
Chloride: 100 mEq/L (ref 96–112)
Creatinine, Ser: 0.8 mg/dL (ref 0.4–1.2)

## 2011-09-18 NOTE — Progress Notes (Signed)
Amber [name] Date of Birth:  1934-09-30 [facility] [address] [place], [place]  [address] [phone]         Fax   [phone]  History of Present Illness: This pleasant 77 year old woman is seen for a scheduled followup office visit.  She has a complex past medical history.  She has known ischemic heart disease with coronary artery bypass graft surgery in 2000.  She has had remote stroke with residual left hemiparesthesias.  She's had a long history of high blood pressure and high cholesterol.  She has established atrial fibrillation and is on Coumadin.  She has chronically elevated CK levels and she has a history of fibromyalgia.  She does not tolerate statins.  She's had congestive heart failure secondary to diastolic dysfunction.  He was cardioverted in June 2012 but remained in sinus rhythm for less than one week.  Current Outpatient Prescriptions  Medication Sig Dispense Refill  . albuterol (PROVENTIL HFA;VENTOLIN HFA) 108 (90 BASE) MCG/ACT inhaler Inhale 2 puffs into the lungs every 6 (six) hours as needed. For shortness of breath      . alendronate (FOSAMAX) 70 MG tablet Take 70 mg by mouth every 7 (seven) days. Take with a full glass of water on an empty stomach.       [name] allopurinol (ZYLOPRIM) 300 MG tablet Take 1 tablet (300 mg total) by mouth daily.  90 tablet  3  . amitriptyline (ELAVIL) 100 MG tablet Take 50 mg by mouth at bedtime.       [name] amLODipine (NORVASC) 5 MG tablet Take 5 mg by mouth daily.      . celecoxib (CELEBREX) 200 MG capsule Take 200 mg by mouth 2 (two) times daily.        . citalopram (CELEXA) 20 MG tablet Take 20 mg by mouth daily.       . clopidogrel (PLAVIX) 75 MG tablet Take 1 tablet (75 mg total) by mouth daily.  30 tablet  11  . digoxin (LANOXIN) 0.125 MG tablet Take 0.125 mg by mouth daily.      [name] donepezil (ARICEPT) 5 MG tablet Take 5 mg by mouth at bedtime as needed. For memory      . famotidine (PEPCID) 20 MG tablet Take 20  mg by mouth daily.        . furosemide (LASIX) 20 MG tablet Take 60-80 mg by mouth 2 (two) times daily. Take 80mg  in the AM and 60mg  in the PM      . levothyroxine (SYNTHROID, LEVOTHROID) 25 MCG tablet Take 25 mcg by mouth daily.      . metoprolol succinate (TOPROL-XL) 25 MG 24 hr tablet Take 25 mg by mouth daily.      . potassium chloride SA (K-DUR,KLOR-CON) 20 MEQ tablet Take 1 tablet (20 mEq total) by mouth 2 (two) times daily.  180 tablet  3  . valsartan (DIOVAN) 160 MG tablet Take 160 mg by mouth daily.      [name] warfarin (COUMADIN) 5 MG tablet Take 5-7.5 mg by mouth daily. Take 7.5mg  on Sun,Wed,and Fri. Take 5mg  all other days      . DISCONTD: albuterol (PROAIR HFA) 108 (90 BASE) MCG/ACT inhaler Inhale 2 puffs into the lungs every 6 (six) hours as needed for wheezing.  1 Inhaler  5    Allergies  Allergen Reactions  . Adhesive (Tape) Other (See Comments)    Reaction unknown  . Ceclor (Cefaclor) Other (See Comments)    Reaction  unknown  . Latex Other (See Comments)    Reaction unknown  . Lipitor (Atorvastatin Calcium) Other (See Comments)    Reaction unknown  . Mevacor (Lovastatin) Other (See Comments)    Reaction unknown  . Pravachol Other (See Comments)    Reaction unknown  . Vasotec Other (See Comments)    Reaction unknown    Patient Active Problem List  Diagnosis  . Atrial fibrillation  . Unspecified cerebral artery occlusion with cerebral infarction  . Heart failure, diastolic, due to HTN  . Dyslipidemia  . Fibromyalgia  . Insomnia due to mental condition  . Depression  . Cervical spinal stenosis  . Hypothyroidism  . Osteoarthritis  . Osteoporosis  . Anemia  . Ischemic heart disease  . S/P CABG (coronary artery bypass graft)  . Benign hypertensive heart disease without heart failure  . COPD (chronic obstructive pulmonary disease)  . Diastolic heart failure secondary to hypertension    History  Smoking status  . Former Smoker -- 1.5 packs/day for 25 years  .  Types: Cigarettes  . Quit date: 02/10/1977  Smokeless tobacco  . Never Used    History  Alcohol Use No    No family history on file.  Review of Systems: Constitutional: no fever chills diaphoresis or fatigue or change in weight.  Head and neck: no hearing loss, no epistaxis, no photophobia or visual disturbance. Respiratory: No cough, shortness of breath or wheezing. Cardiovascular: No chest pain peripheral edema, palpitations. Gastrointestinal: No abdominal distention, no abdominal pain, no change in bowel habits hematochezia or melena. Genitourinary: No dysuria, no frequency, no urgency, no nocturia. Musculoskeletal:No arthralgias, no back pain, no gait disturbance or myalgias. Neurological: No dizziness, no headaches, no numbness, no seizures, no syncope, no weakness, no tremors. Hematologic: No lymphadenopathy, no easy bruising. Psychiatric: No confusion, no hallucinations, no sleep disturbance.    Physical Exam: Filed Vitals:   09/18/11 1027  BP: 130/78  Pulse: 78   the general appearance reveals a well-developed well-nourished woman in no distress.  Scalp reveals a healing lesion from her recent fall.  There is no sign of infection.Pupils equal and reactive.   Extraocular Movements are full.  There is no scleral icterus.  The mouth and pharynx are normal.  The neck is supple.  The carotids reveal no bruits.  The jugular venous pressure is normal.  The thyroid is not enlarged.  There is no lymphadenopathy.  The chest is clear to percussion and auscultation. There are no rales or rhonchi. Expansion of the chest is symmetrical.  Heart reveals an irregular rhythm and a soft systolic ejection murmur at the base.The abdomen is soft and nontender. Bowel sounds are normal. The liver and spleen are not enlarged. There Are no abdominal masses. There are no bruits.  Extremities reveal she wears a brace on the left lower limb as result of her previous stroke.  Pedal pulses are present.   There is no significant edema.   Assessment / Plan:  Continue on same medication.  Blood work today pending.  Recheck in 3 months for office visit and fasting lab work

## 2011-09-18 NOTE — Assessment & Plan Note (Signed)
Patient has a lot of musculoskeletal pain secondary to fibromyalgia and osteoarthritis.  She takes daily Celebrex.  Her rheumatologist also told her that she could take up to 6 Tylenol extra strength tablets daily.  Her keeping a close check on her liver function studies

## 2011-09-18 NOTE — Assessment & Plan Note (Signed)
The patient has had no exacerbation of her shortness of breath.  No chest pain.  She does have exertional dyspnea with household activities such as vacuuming and she can only do one room at that time before stopping to rest

## 2011-09-18 NOTE — Assessment & Plan Note (Signed)
No TIA symptoms.  The patient did have a bad fall down the steps of her daughter's home and she hit her head on the sidewalk.  She did go to the hospital.  She had a bad abrasion which was bleeding but she did not require any stitches.  CT scan showed soft tissue hemorrhage but no skull fracture and no intracranial bleed

## 2011-09-18 NOTE — Progress Notes (Signed)
Quick Note:  Please report to patient. The recent labs are stable. Continue same medication and careful diet. LDL still very high but improved from last time ______

## 2011-09-18 NOTE — Patient Instructions (Addendum)
Your physician recommends that you continue on your current medications as directed. Please refer to the Current Medication list given to you today  Your physician recommends that you schedule a follow-up appointment in: 3 months with fasting labs (LP/BMET/HFP)  

## 2011-09-22 ENCOUNTER — Telehealth: Payer: Self-pay | Admitting: *Deleted

## 2011-09-22 NOTE — Telephone Encounter (Signed)
Message copied by Burnell Blanks on Mon Sep 22, 2011  5:20 PM ------      Message from: Cassell Clement      Created: Thu Sep 18, 2011  9:10 PM       Please report to patient.  The recent labs are stable. Continue same medication and careful diet. LDL still very high but improved from last time

## 2011-09-22 NOTE — Telephone Encounter (Signed)
Advised patient of lab results  

## 2011-09-24 ENCOUNTER — Other Ambulatory Visit: Payer: Self-pay | Admitting: Cardiology

## 2011-09-24 NOTE — Telephone Encounter (Signed)
Refilled celexa

## 2011-09-25 ENCOUNTER — Other Ambulatory Visit: Payer: Self-pay | Admitting: Cardiology

## 2011-09-25 NOTE — Telephone Encounter (Signed)
Refilled digoxin 

## 2011-10-02 ENCOUNTER — Ambulatory Visit (INDEPENDENT_AMBULATORY_CARE_PROVIDER_SITE_OTHER): Payer: Medicare Other | Admitting: Pharmacist

## 2011-10-02 DIAGNOSIS — I4891 Unspecified atrial fibrillation: Secondary | ICD-10-CM

## 2011-10-02 DIAGNOSIS — I6350 Cerebral infarction due to unspecified occlusion or stenosis of unspecified cerebral artery: Secondary | ICD-10-CM

## 2011-10-20 ENCOUNTER — Other Ambulatory Visit: Payer: Self-pay | Admitting: Cardiology

## 2011-10-23 ENCOUNTER — Ambulatory Visit (INDEPENDENT_AMBULATORY_CARE_PROVIDER_SITE_OTHER): Payer: Medicare Other | Admitting: *Deleted

## 2011-10-23 DIAGNOSIS — I4891 Unspecified atrial fibrillation: Secondary | ICD-10-CM

## 2011-10-23 DIAGNOSIS — I6350 Cerebral infarction due to unspecified occlusion or stenosis of unspecified cerebral artery: Secondary | ICD-10-CM

## 2011-11-06 ENCOUNTER — Ambulatory Visit (INDEPENDENT_AMBULATORY_CARE_PROVIDER_SITE_OTHER): Payer: Medicare Other | Admitting: *Deleted

## 2011-11-06 DIAGNOSIS — I6350 Cerebral infarction due to unspecified occlusion or stenosis of unspecified cerebral artery: Secondary | ICD-10-CM

## 2011-11-06 DIAGNOSIS — I4891 Unspecified atrial fibrillation: Secondary | ICD-10-CM

## 2011-12-02 ENCOUNTER — Other Ambulatory Visit (HOSPITAL_COMMUNITY): Payer: Self-pay | Admitting: Physical Medicine and Rehabilitation

## 2011-12-02 DIAGNOSIS — M542 Cervicalgia: Secondary | ICD-10-CM

## 2011-12-04 ENCOUNTER — Ambulatory Visit (INDEPENDENT_AMBULATORY_CARE_PROVIDER_SITE_OTHER): Payer: Medicare Other | Admitting: *Deleted

## 2011-12-04 DIAGNOSIS — I4891 Unspecified atrial fibrillation: Secondary | ICD-10-CM

## 2011-12-04 DIAGNOSIS — I6350 Cerebral infarction due to unspecified occlusion or stenosis of unspecified cerebral artery: Secondary | ICD-10-CM

## 2011-12-04 LAB — POCT INR: INR: 2.6

## 2011-12-09 ENCOUNTER — Ambulatory Visit (HOSPITAL_COMMUNITY)
Admission: RE | Admit: 2011-12-09 | Discharge: 2011-12-09 | Disposition: A | Discharge status: Home / Self Care | Payer: Medicare Other | Source: Ambulatory Visit | Attending: Physical Medicine and Rehabilitation | Admitting: Physical Medicine and Rehabilitation

## 2011-12-09 DIAGNOSIS — M542 Cervicalgia: Secondary | ICD-10-CM

## 2011-12-09 DIAGNOSIS — I63219 Cerebral infarction due to unspecified occlusion or stenosis of unspecified vertebral arteries: Secondary | ICD-10-CM | POA: Insufficient documentation

## 2011-12-09 LAB — CREATININE, SERUM
Creatinine, Ser: 0.77 mg/dL (ref 0.50–1.10)
GFR calc Af Amer: >90 mL/min (ref >90)
GFR calc non Af Amer: 80 mL/min — ABNORMAL LOW (ref >90)

## 2011-12-09 MED ORDER — GADOBENATE DIMEGLUMINE 529 MG/ML IV SOLN
15.0000 mL | Freq: Once | INTRAVENOUS | Status: AC | PRN
  Administered 2011-12-09: 15 mL via INTRAVENOUS

## 2011-12-10 ENCOUNTER — Telehealth: Payer: Self-pay | Admitting: Cardiology

## 2011-12-10 NOTE — Telephone Encounter (Signed)
Occluded vertebral artery right, and left getting occluded.  Dr Corliss Skains interventional radiologist at [facility], needs referral.  Daughter has discussed with  Dr. Patty Sermons and he will review chart

## 2011-12-10 NOTE — Telephone Encounter (Signed)
New Problem:    Called in needing a referral for her mother to have a procedure.  Please call back.

## 2011-12-10 NOTE — Telephone Encounter (Signed)
Pt's dtr calling back, joan delk, nurse at [facility], said dr Patty Sermons asked her to call and speak with you to give you info so he can discuss with you tomorrow

## 2011-12-11 ENCOUNTER — Other Ambulatory Visit: Payer: Self-pay | Admitting: Cardiology

## 2011-12-11 DIAGNOSIS — I6529 Occlusion and stenosis of unspecified carotid artery: Secondary | ICD-10-CM

## 2011-12-11 NOTE — Telephone Encounter (Signed)
I spoke with the patient's daughter about the patient's situation yesterday at [facility].  Please refer patient to Dr. Corliss Skains at [facility] neuroradiology.

## 2011-12-11 NOTE — Telephone Encounter (Signed)
Will forward to  Dr. Brackbill  

## 2011-12-11 NOTE — Telephone Encounter (Signed)
Spoke with Aurea Graff - she reports Dr Corliss Skains is going to see the pt in consult on Monday at 2 pm.  Aurea Graff states she was told the pt needs to be bridged starting tomorrow possibly for a procedure that may be done on Tuesday.  There are no orders, no consult and nothing scheduled at this point.  Relayed information to our coumadin clinic who states they need orders or some type of documentation before she can be bridged.  Will contact Dr Corliss Skains. Spoke with Victorino Dike with Dr Corliss Skains - pt has been scheduled for a consult on Monday at 2 pm  Orders can not be given for bridging until after the procedure has been scheduled.  They will contact Home for bridging after the consult.

## 2011-12-11 NOTE — Telephone Encounter (Signed)
F/u  Calling [facility] back - upcoming appt.

## 2011-12-11 NOTE — Telephone Encounter (Signed)
Left message on Jennifer's voice mail.

## 2011-12-15 ENCOUNTER — Ambulatory Visit (HOSPITAL_COMMUNITY)
Admission: RE | Admit: 2011-12-15 | Discharge: 2011-12-15 | Disposition: A | Discharge status: Home / Self Care | Payer: Medicare Other | Source: Ambulatory Visit | Attending: Cardiology | Admitting: Cardiology

## 2011-12-15 DIAGNOSIS — I6529 Occlusion and stenosis of unspecified carotid artery: Secondary | ICD-10-CM

## 2011-12-16 ENCOUNTER — Telehealth: Payer: Self-pay | Admitting: *Deleted

## 2011-12-16 NOTE — Telephone Encounter (Signed)
Will forward to Dr Corliss Skains

## 2011-12-16 NOTE — Telephone Encounter (Signed)
The patient is cleared for interventional neuroradiology surgery in the cardiac standpoint.  He is on long-term Coumadin and has a high chadss score and will need bridging with Lovenox

## 2011-12-16 NOTE — Telephone Encounter (Signed)
Per patients husband, patient needs surgical clearance sent to Dr Corliss Skains for upcoming procedure. Will forward to  Dr. Patty Sermons for review

## 2011-12-18 ENCOUNTER — Encounter: Payer: Self-pay | Admitting: Cardiology

## 2011-12-18 ENCOUNTER — Encounter: Payer: Self-pay | Admitting: *Deleted

## 2011-12-18 ENCOUNTER — Ambulatory Visit (INDEPENDENT_AMBULATORY_CARE_PROVIDER_SITE_OTHER): Payer: Medicare Other | Admitting: *Deleted

## 2011-12-18 ENCOUNTER — Telehealth: Payer: Self-pay | Admitting: *Deleted

## 2011-12-18 ENCOUNTER — Other Ambulatory Visit (HOSPITAL_COMMUNITY): Payer: Self-pay | Admitting: Interventional Radiology

## 2011-12-18 ENCOUNTER — Other Ambulatory Visit (INDEPENDENT_AMBULATORY_CARE_PROVIDER_SITE_OTHER): Payer: Medicare Other

## 2011-12-18 ENCOUNTER — Ambulatory Visit (INDEPENDENT_AMBULATORY_CARE_PROVIDER_SITE_OTHER): Payer: Medicare Other | Admitting: Cardiology

## 2011-12-18 VITALS — BP 140/72 | HR 78 | Resp 18 | Ht 64.0 in | Wt 170.8 lb

## 2011-12-18 DIAGNOSIS — I6509 Occlusion and stenosis of unspecified vertebral artery: Secondary | ICD-10-CM | POA: Insufficient documentation

## 2011-12-18 DIAGNOSIS — I119 Hypertensive heart disease without heart failure: Secondary | ICD-10-CM

## 2011-12-18 DIAGNOSIS — I6350 Cerebral infarction due to unspecified occlusion or stenosis of unspecified cerebral artery: Secondary | ICD-10-CM

## 2011-12-18 DIAGNOSIS — I110 Hypertensive heart disease with heart failure: Secondary | ICD-10-CM

## 2011-12-18 DIAGNOSIS — I4891 Unspecified atrial fibrillation: Secondary | ICD-10-CM

## 2011-12-18 DIAGNOSIS — I5030 Unspecified diastolic (congestive) heart failure: Secondary | ICD-10-CM

## 2011-12-18 DIAGNOSIS — R0989 Other specified symptoms and signs involving the circulatory and respiratory systems: Secondary | ICD-10-CM

## 2011-12-18 DIAGNOSIS — M542 Cervicalgia: Secondary | ICD-10-CM

## 2011-12-18 LAB — CBC WITH DIFFERENTIAL/PLATELET
Basophils Absolute: 0 10*3/uL (ref 0.0–0.1)
Basophils Relative: 0.4 % (ref 0.0–3.0)
Eosinophils Absolute: 0.1 10*3/uL (ref 0.0–0.7)
Lymphocytes Relative: 26 % (ref 12.0–46.0)
MCHC: 32.8 g/dL (ref 30.0–36.0)
MCV: 92.1 fl (ref 78.0–100.0)
Monocytes Absolute: 0.6 10*3/uL (ref 0.1–1.0)
Neutrophils Relative %: 64.3 % (ref 43.0–77.0)
Platelets: 285 10*3/uL (ref 150.0–400.0)
RBC: 4.59 Mil/uL (ref 3.87–5.11)
WBC: 7.5 10*3/uL (ref 4.5–10.5)

## 2011-12-18 LAB — POCT INR: INR: 2.9

## 2011-12-18 MED ORDER — ENOXAPARIN SODIUM 80 MG/0.8ML ~~LOC~~ SOLN: 80.0000 mg | Freq: Two times a day (BID) | SUBCUTANEOUS | Status: DC

## 2011-12-18 NOTE — Telephone Encounter (Signed)
Per Victorino Dike assistant for Dr Corliss Skains patient needs to come off coumadin for procedure, per Dr Patty Sermons patient needs bridging with Lovenox. Wt--77.5 kg [place]--0.77 Age--70 Cc--77.2  11/7 last dose of coumadin 11/8 no coumadin or Lovenox 11/9   Lovenox 80 mg subcutaneous 9 am and 9 pm 11/10 Lovenox 80 mg subcutaneous 9 am and 9 pm 11/11 Lovenox 80 mg subcutaneous 9 am and 9 pm 11/12 Lovenox 80 mg subcutaneous 9 am (am dose only) 11/13 no coumadin or Lovenox day of procedure To be admitted for procedure, follow discharge instructions post op   Script sent to [facility] pharmacy

## 2011-12-18 NOTE — Assessment & Plan Note (Signed)
She has not been experiencing any symptoms of recurrent congestive heart failure

## 2011-12-18 NOTE — Patient Instructions (Addendum)
Vertebral artery occlusion, Dr Corliss Skains,  intervention radiology to perform procedure on 12/24/2011, waiting to hear from him regarding holding coumadin prior to procedure, per Dr Patty Sermons patient will need lovenox bridging if she is to hold her coumadin

## 2011-12-18 NOTE — Assessment & Plan Note (Signed)
The patient has vertebral artery stenosis.  She is a satisfactory candidate for an attempt at percutaneous stenting of the lesion by Dr. Corliss Skains.  A date for surgery has not yet been set.  Once we know the surgical date we will instruct her to stop her Coumadin 5 days prior to surgery and we will bridge with Lovenox on the second day and on the first day prior to surgery.  She has a high chadss score because of age, history of heart failure, distress prior stroke, and history of hypertension.

## 2011-12-18 NOTE — Patient Instructions (Addendum)
Your physician recommends that you continue on your current medications as directed. Please refer to the Current Medication list given to you today.  Your physician recommends that you schedule a follow-up appointment in: 3 months with fasting labs (LP/BMBET/HFP) EKG  Will obtain labs today and call you with the results (LP/BMET/HFP/CK/CBC)  WILL WAIT FOR PROCEDURE SCHEDULED AND CALL WITH LOVENOX INSTRUCTIONS, PER SALLY P. PHARM D

## 2011-12-18 NOTE — Assessment & Plan Note (Signed)
The pressure was remaining stable on current therapy.  The patient has not been experiencing any chest pain or angina.

## 2011-12-18 NOTE — Progress Notes (Addendum)
Amber [name] Date of Birth:  09/23/1934 [facility] [address] [place], [place]  [address] [phone]         Fax   [phone]  History of Present Illness: This pleasant 77 year old woman is seen for a scheduled followup office visit. She has a complex past medical history. She has known ischemic heart disease with coronary artery bypass graft surgery in 2000. She has had remote stroke with residual left hemiparesthesias. She's had a long history of high blood pressure and high cholesterol. She has established atrial fibrillation and is on Coumadin. She has chronically elevated CK levels and she has a history of fibromyalgia. She does not tolerate statins. She's had congestive heart failure secondary to diastolic dysfunction. He was cardioverted in June 2012 but remained in sinus rhythm for less than one week.  She is now in established chronic atrial fibrillation on Coumadin. Recently she saw her orthopedist complaining of left neck pain.  An MRI and MRA of the neck and head were obtained which showed significant vertebral artery stenosis.   Current Outpatient Prescriptions  Medication Sig Dispense Refill  . albuterol (PROVENTIL HFA;VENTOLIN HFA) 108 (90 BASE) MCG/ACT inhaler Inhale 2 puffs into the lungs every 6 (six) hours as needed. For shortness of breath      . alendronate (FOSAMAX) 70 MG tablet Take 70 mg by mouth every 7 (seven) days. Take with a full glass of water on an empty stomach.       [name] allopurinol (ZYLOPRIM) 300 MG tablet TAKE 1 TABLET EVERY DAY  90 tablet  3  . amitriptyline (ELAVIL) 100 MG tablet Take 50 mg by mouth at bedtime.       [name] amLODipine (NORVASC) 5 MG tablet Take 5 mg by mouth daily.      . celecoxib (CELEBREX) 200 MG capsule Take 200 mg by mouth 2 (two) times daily.        . citalopram (CELEXA) 20 MG tablet TAKE 2 TABLETS DAILY  60 tablet  3  . clopidogrel (PLAVIX) 75 MG tablet Take 1 tablet (75 mg total) by mouth daily.  30 tablet   11  . digoxin (LANOXIN) 0.125 MG tablet TAKE 1 TABLET EVERY DAY  90 tablet  3  . donepezil (ARICEPT) 5 MG tablet Take 5 mg by mouth at bedtime as needed. For memory      . famotidine (PEPCID) 20 MG tablet Take 20 mg by mouth daily.        . furosemide (LASIX) 20 MG tablet Take 60-80 mg by mouth 2 (two) times daily. Take 80mg  in the AM and 60mg  in the PM      . levothyroxine (SYNTHROID, LEVOTHROID) 25 MCG tablet Take 25 mcg by mouth daily.      . metoprolol succinate (TOPROL-XL) 25 MG 24 hr tablet Take 25 mg by mouth daily.      . potassium chloride SA (K-DUR,KLOR-CON) 20 MEQ tablet Take 1 tablet (20 mEq total) by mouth 2 (two) times daily.  180 tablet  3  . valsartan (DIOVAN) 160 MG tablet Take 160 mg by mouth daily.      [name] warfarin (COUMADIN) 5 MG tablet Take 5-7.5 mg by mouth daily. Take 7.5mg  on Sun,Wed. Take 5mg  all other days      . [DISCONTINUED] citalopram (CELEXA) 20 MG tablet Take 20 mg by mouth daily.       . [DISCONTINUED] digoxin (LANOXIN) 0.125 MG tablet Take 0.125 mg by mouth daily.      . [  DISCONTINUED] albuterol (PROAIR HFA) 108 (90 BASE) MCG/ACT inhaler Inhale 2 puffs into the lungs every 6 (six) hours as needed for wheezing.  1 Inhaler  5    Allergies  Allergen Reactions  . Adhesive (Tape) Other (See Comments)    Reaction unknown  . Ceclor (Cefaclor) Other (See Comments)    Reaction unknown  . Latex Other (See Comments)    Reaction unknown  . Lipitor (Atorvastatin Calcium) Other (See Comments)    Reaction unknown  . Mevacor (Lovastatin) Other (See Comments)    Reaction unknown  . Pravachol Other (See Comments)    Reaction unknown  . Vasotec Other (See Comments)    Reaction unknown    Patient Active Problem List  Diagnosis  . Atrial fibrillation  . Unspecified cerebral artery occlusion with cerebral infarction  . Heart failure, diastolic, due to HTN  . Dyslipidemia  . Fibromyalgia  . Insomnia due to mental condition  . Depression  . Cervical spinal stenosis    . Hypothyroidism  . Osteoarthritis  . Osteoporosis  . Anemia  . Ischemic heart disease  . S/P CABG (coronary artery bypass graft)  . Benign hypertensive heart disease without heart failure  . COPD (chronic obstructive pulmonary disease)  . Diastolic heart failure secondary to hypertension    History  Smoking status  . Former Smoker -- 1.5 packs/day for 25 years  . Types: Cigarettes  . Quit date: 02/10/1977  Smokeless tobacco  . Never Used    History  Alcohol Use No    No family history on file.  Review of Systems: Constitutional: no fever chills diaphoresis or fatigue or change in weight.  Head and neck: no hearing loss, no epistaxis, no photophobia or visual disturbance. Respiratory: No cough, shortness of breath or wheezing. Cardiovascular: No chest pain peripheral edema, palpitations. Gastrointestinal: No abdominal distention, no abdominal pain, no change in bowel habits hematochezia or melena. Genitourinary: No dysuria, no frequency, no urgency, no nocturia. Musculoskeletal:No arthralgias, no back pain, no gait disturbance or myalgias. Neurological: No dizziness, no headaches, no numbness, no seizures, no syncope, no weakness, no tremors. Hematologic: No lymphadenopathy, no easy bruising. Psychiatric: No confusion, no hallucinations, no sleep disturbance.    Physical Exam: Filed Vitals:   12/18/11 1053  BP: 140/72  Pulse: 78  Resp: 18   the general appearance reveals a well-developed well-nourished woman in no distress.The chest is clear to percussion and auscultation. There are no rales or rhonchi. Expansion of the chest is symmetrical.  Heart reveals an irregular rhythm and a soft systolic ejection murmur at the base.  No diastolic murmur.The abdomen is soft and nontender. Bowel sounds are normal. The liver and spleen are not enlarged. There Are no abdominal masses. There are no bruits.  Extremities reveal that she wears a brace on the left lower limb as a  result of her previous stroke.  No significant edema  Assessment / Plan: The patient is a satisfactory risk for the proposed interventional neuroradiology procedure.  She will need to be off her Coumadin for 5 days with Lovenox bridging.   Recheck here in 3 months for followup office visit EKG and fasting lab work.  Blood work today is pending

## 2011-12-19 ENCOUNTER — Encounter (HOSPITAL_COMMUNITY): Payer: Self-pay | Admitting: Pharmacy Technician

## 2011-12-23 ENCOUNTER — Encounter (HOSPITAL_COMMUNITY)
Admission: RE | Admit: 2011-12-23 | Discharge: 2011-12-23 | Disposition: A | Discharge status: Home / Self Care | Payer: Medicare Other | Source: Ambulatory Visit | Attending: Anesthesiology | Admitting: Anesthesiology

## 2011-12-23 ENCOUNTER — Telehealth: Payer: Self-pay | Admitting: Physician Assistant

## 2011-12-23 ENCOUNTER — Telehealth: Payer: Self-pay | Admitting: *Deleted

## 2011-12-23 ENCOUNTER — Other Ambulatory Visit: Payer: Self-pay | Admitting: Radiology

## 2011-12-23 ENCOUNTER — Encounter (HOSPITAL_COMMUNITY)
Admission: RE | Admit: 2011-12-23 | Discharge: 2011-12-23 | Disposition: A | Discharge status: Home / Self Care | Payer: Medicare Other | Source: Ambulatory Visit | Attending: Interventional Radiology | Admitting: Interventional Radiology

## 2011-12-23 ENCOUNTER — Encounter (HOSPITAL_COMMUNITY): Payer: Self-pay

## 2011-12-23 HISTORY — DX: Gastro-esophageal reflux disease without esophagitis: K21.9

## 2011-12-23 LAB — CBC
HCT: 40 % (ref 36.0–46.0)
Hemoglobin: 13.4 g/dL (ref 12.0–15.0)
MCHC: 33.5 g/dL (ref 30.0–36.0)
MCV: 90.5 fL (ref 78.0–100.0)
RDW: 14.5 % (ref 11.5–15.5)
WBC: 7.8 10*3/uL (ref 4.0–10.5)

## 2011-12-23 LAB — BASIC METABOLIC PANEL
BUN: 14 mg/dL (ref 6–23)
CO2: 25 mEq/L (ref 19–32)
Chloride: 100 mEq/L (ref 96–112)
Creatinine, Ser: 0.73 mg/dL (ref 0.50–1.10)
Potassium: 4.1 mEq/L (ref 3.5–5.1)

## 2011-12-23 NOTE — Telephone Encounter (Signed)
Message copied by Burnell Blanks on Tue Dec 23, 2011  2:33 PM ------      Message from: Cassell Clement      Created: Sun Dec 21, 2011  1:23 PM       Total CK is 456  Higher than recently.  CBC is normal.  No anemia.

## 2011-12-23 NOTE — Pre-Procedure Instructions (Signed)
22 Amber [name]  12/23/2011   Your procedure is scheduled on:  Wednesday, November 13th  Report to [facility] at 0630 AM.  Call this number if you have problems the morning of surgery: [phone]   Remember:   Do not eat food or drink:After Midnight.   Take these medicines the morning of surgery with A SIP OF WATER: norvasc, celexa, allopurinol,digoxin, pepcid, synthroid, toprol   Do not wear jewelry, make-up or nail polish.  Do not wear lotions, powders, or perfumes. You may wear deodorant.  Do not shave 48 hours prior to surgery. Men may shave face and neck.  Do not bring valuables to the hospital.  Contacts, dentures or bridgework may not be worn into surgery.  Leave suitcase in the car. After surgery it may be brought to your room.  For patients admitted to the hospital, checkout time is 11:00 AM the day of discharge.   Patients discharged the day of surgery will not be allowed to drive home.   Special Instructions: Shower using CHG 2 nights before surgery and the night before surgery.  If you shower the day of surgery use CHG.  Use special wash - you have one bottle of CHG for all showers.  You should use approximately 1/3 of the bottle for each shower.   Please read over the following fact sheets that you were given: Pain Booklet, Coughing and Deep Breathing, MRSA Information and Surgical Site Infection Prevention

## 2011-12-23 NOTE — Progress Notes (Signed)
Primary Physician - Dr. Loraine Maple [facility]  Cardiologist - Dr. Patty Sermons EKG in epic from May 2013 Echo 2010 in epic

## 2011-12-23 NOTE — Telephone Encounter (Signed)
Peer to Peer discussion x 2 with [employer].  They were informed we have waited greater than 1 week on approval.  Patient failing medical management including coumadin, plavix and ASA with symptoms of dizziness, weakness and unsteady gait.  MRA reveals Rt VA occlusion with Lt vertebral artery dominant and approximately 70% stenosis of this vessel which goes along with her clinical findings.  Patient at high risk of stroke and in need of angiogram with stenting.  Cardiology managing patient while off coumadin.  MD with [employer] stated they were still in review and that case was a difficult one.  I discussed with Dr. Corliss Skains, Bari Edward and Pearlean Brownie that preauthorization was still in process. It was agreed to proceed at this time with angiogram /stenting at the best interest of the patient without further delay.

## 2011-12-23 NOTE — Telephone Encounter (Signed)
Advised patient of lab results  

## 2011-12-24 ENCOUNTER — Ambulatory Visit (HOSPITAL_COMMUNITY)
Admission: RE | Admit: 2011-12-24 | Discharge: 2011-12-24 | Disposition: A | Discharge status: Home / Self Care | Payer: Medicare Other | Source: Ambulatory Visit | Attending: Interventional Radiology | Admitting: Interventional Radiology

## 2011-12-24 ENCOUNTER — Ambulatory Visit (HOSPITAL_COMMUNITY): Payer: Medicare Other | Admitting: Critical Care Medicine

## 2011-12-24 ENCOUNTER — Encounter (HOSPITAL_COMMUNITY)
Admission: RE | Disposition: A | Discharge status: Home / Self Care | Payer: Self-pay | Source: Ambulatory Visit | Attending: Interventional Radiology

## 2011-12-24 ENCOUNTER — Encounter (HOSPITAL_COMMUNITY): Payer: Self-pay | Admitting: Critical Care Medicine

## 2011-12-24 ENCOUNTER — Encounter (HOSPITAL_COMMUNITY): Payer: Self-pay | Admitting: *Deleted

## 2011-12-24 VITALS — BP 114/46 | HR 69 | Temp 96.7°F | Resp 18

## 2011-12-24 DIAGNOSIS — I10 Essential (primary) hypertension: Secondary | ICD-10-CM | POA: Insufficient documentation

## 2011-12-24 DIAGNOSIS — I6509 Occlusion and stenosis of unspecified vertebral artery: Secondary | ICD-10-CM | POA: Insufficient documentation

## 2011-12-24 DIAGNOSIS — E785 Hyperlipidemia, unspecified: Secondary | ICD-10-CM | POA: Insufficient documentation

## 2011-12-24 DIAGNOSIS — R269 Unspecified abnormalities of gait and mobility: Secondary | ICD-10-CM | POA: Insufficient documentation

## 2011-12-24 DIAGNOSIS — J438 Other emphysema: Secondary | ICD-10-CM | POA: Insufficient documentation

## 2011-12-24 DIAGNOSIS — I658 Occlusion and stenosis of other precerebral arteries: Secondary | ICD-10-CM | POA: Insufficient documentation

## 2011-12-24 DIAGNOSIS — I4891 Unspecified atrial fibrillation: Secondary | ICD-10-CM | POA: Insufficient documentation

## 2011-12-24 DIAGNOSIS — I509 Heart failure, unspecified: Secondary | ICD-10-CM | POA: Insufficient documentation

## 2011-12-24 DIAGNOSIS — M810 Age-related osteoporosis without current pathological fracture: Secondary | ICD-10-CM | POA: Insufficient documentation

## 2011-12-24 DIAGNOSIS — R9409 Abnormal results of other function studies of central nervous system: Secondary | ICD-10-CM | POA: Insufficient documentation

## 2011-12-24 DIAGNOSIS — E039 Hypothyroidism, unspecified: Secondary | ICD-10-CM | POA: Insufficient documentation

## 2011-12-24 DIAGNOSIS — R42 Dizziness and giddiness: Secondary | ICD-10-CM | POA: Insufficient documentation

## 2011-12-24 DIAGNOSIS — I2510 Atherosclerotic heart disease of native coronary artery without angina pectoris: Secondary | ICD-10-CM | POA: Insufficient documentation

## 2011-12-24 HISTORY — PX: RADIOLOGY WITH ANESTHESIA: SHX6223

## 2011-12-24 LAB — PROTIME-INR
INR: 0.95 (ref 0.00–1.49)
Prothrombin Time: 12.6 seconds (ref 11.6–15.2)

## 2011-12-24 SURGERY — RADIOLOGY WITH ANESTHESIA
Anesthesia: General

## 2011-12-24 MED ORDER — IOHEXOL 300 MG/ML  SOLN
150.0000 mL | Freq: Once | INTRAMUSCULAR | Status: AC | PRN
  Administered 2011-12-24: 75 mL via INTRA_ARTERIAL

## 2011-12-24 MED ORDER — DIPHENHYDRAMINE HCL 50 MG/ML IJ SOLN
INTRAMUSCULAR | Status: DC | PRN
  Administered 2011-12-24: 25 mg via INTRAVENOUS

## 2011-12-24 MED ORDER — NITROGLYCERIN 5 MG/ML IV SOLN
1.5000 mg | INTRAVENOUS | Status: DC
  Filled 2011-12-24: qty 0.3

## 2011-12-24 MED ORDER — SODIUM CHLORIDE 0.9 % IV SOLN: INTRAVENOUS | Status: AC

## 2011-12-24 MED ORDER — LABETALOL HCL 5 MG/ML IV SOLN
INTRAVENOUS | Status: DC | PRN
  Administered 2011-12-24: 5 mg via INTRAVENOUS
  Administered 2011-12-24 (×2): 10 mg via INTRAVENOUS
  Administered 2011-12-24: 5 mg via INTRAVENOUS

## 2011-12-24 MED ORDER — CEFAZOLIN SODIUM-DEXTROSE 2-3 GM-% IV SOLR
INTRAVENOUS | Status: AC
  Filled 2011-12-24: qty 50

## 2011-12-24 MED ORDER — ONDANSETRON HCL 4 MG/2ML IJ SOLN: 4.0000 mg | Freq: Four times a day (QID) | INTRAMUSCULAR | Status: DC | PRN

## 2011-12-24 MED ORDER — FENTANYL CITRATE 0.05 MG/ML IJ SOLN
INTRAMUSCULAR | Status: DC | PRN
  Administered 2011-12-24 (×2): 25 ug via INTRAVENOUS
  Administered 2011-12-24 (×3): 50 ug via INTRAVENOUS

## 2011-12-24 MED ORDER — ASPIRIN EC 325 MG PO TBEC
DELAYED_RELEASE_TABLET | ORAL | Status: AC
  Filled 2011-12-24: qty 1

## 2011-12-24 MED ORDER — OXYCODONE HCL 5 MG PO TABS: 5.0000 mg | ORAL_TABLET | Freq: Once | ORAL | Status: AC | PRN

## 2011-12-24 MED ORDER — LACTATED RINGERS IV SOLN
INTRAVENOUS | Status: DC | PRN
  Administered 2011-12-24: 09:00:00 via INTRAVENOUS

## 2011-12-24 MED ORDER — HEPARIN SODIUM (PORCINE) 1000 UNIT/ML IJ SOLN
INTRAMUSCULAR | Status: DC | PRN
  Administered 2011-12-24: 1000 [IU] via INTRAVENOUS

## 2011-12-24 MED ORDER — SODIUM CHLORIDE 0.9 % IV SOLN: INTRAVENOUS | Status: DC

## 2011-12-24 MED ORDER — OXYCODONE HCL 5 MG/5ML PO SOLN: 5.0000 mg | Freq: Once | ORAL | Status: AC | PRN

## 2011-12-24 MED ORDER — FENTANYL CITRATE 0.05 MG/ML IJ SOLN: 25.0000 ug | INTRAMUSCULAR | Status: DC | PRN

## 2011-12-24 MED ORDER — ASPIRIN EC 325 MG PO TBEC
325.0000 mg | DELAYED_RELEASE_TABLET | Freq: Once | ORAL | Status: AC
  Administered 2011-12-24: 325 mg via ORAL

## 2011-12-24 NOTE — Transfer of Care (Signed)
Immediate Anesthesia Transfer of Care Note  Patient: Amber [name]  Procedure(s) Performed: Procedure(s) (LRB) with comments: RADIOLOGY WITH ANESTHESIA (N/A) - Extra Cranial Vascular Stent  Patient Location: PACU  Anesthesia Type:MAC  Level of Consciousness: awake, alert , oriented and patient cooperative  Airway & Oxygen Therapy: Patient Spontanous Breathing  Post-op Assessment: Report given to PACU RN, Post -op Vital signs reviewed and stable and Patient moving all extremities  Post vital signs: Reviewed and stable  Complications: No apparent anesthesia complications

## 2011-12-24 NOTE — H&P (Signed)
Amber [name] is an 77 y.o. female.   Chief Complaint: vertebral artery disease HPI: Patient with symptomatic progressive vertebral artery disease presents today for diagnostic cerebral arteriogram with possible endovascular intervention (angioplasty/stenting). Recent MRI/MRA revealed progressive occlusion of the right vertebral artery and also suggestion of a high grade stenosis of the left verterbral artery at its origin.  Past Medical History  Diagnosis Date  . Coronary artery disease   . Hypertension   . Hyperlipidemia   . Hypothyroidism   . Depression   . Insomnia   . Fibromyalgia   . Gout   . Osteoarthritis   . Osteoporosis   . Cervical spinal stenosis   . Nasal polyposis   . Pneumonia     1990  . COPD with emphysema     PFT 05/02/10>>FEV1 1.35(62%), FEV1% 66, DLCO 75%  . Myocardial infarction   . CHF (congestive heart failure)   . Atrial fibrillation   . CVA (cerebral vascular accident)     left sided weakness  . GERD (gastroesophageal reflux disease)     pepcid 2-3 times per week  . Hx of dizziness   . Intermittent confusion   . Cancer     squamous cell, melanoma    Past Surgical History  Procedure Date  . Abdominal hysterectomy   . Breast lumpectomy 1980s    Benign lesion - right  . Carpel tunnel     right  . Tonsillectomy   . Eye surgery     bilateral cataracts  . Coronary artery bypass graft 2000  FH: positive for stroke and aneurysmal bleed in mother at age 60, heart failure, CAD and diabetes  Social History:  reports that she quit smoking about 34 years ago. Her smoking use included Cigarettes. She has a 37.5 pack-year smoking history. She has never used smokeless tobacco. She reports that she does not drink alcohol or use illicit drugs.  Allergies:  Allergies  Allergen Reactions  . Ceclor (Cefaclor) Other (See Comments)    Reaction unknown  . Lipitor (Atorvastatin Calcium) Other (See Comments)    Increased fibromyalgia pain  . Mevacor  (Lovastatin) Other (See Comments)    Increased fibromyalgia pain  . Pravachol Other (See Comments)    Increased fibromyalgia pain  . Vasotec Other (See Comments)    Reaction unknown  . Adhesive (Tape) Itching and Rash    Burning   . Latex Itching and Rash    Burning     Medications Prior to Admission  Medication Sig Dispense Refill  . alendronate (FOSAMAX) 70 MG tablet Take 70 mg by mouth every 7 (seven) days. Take with a full glass of water on an empty stomach. Sundays      . allopurinol (ZYLOPRIM) 300 MG tablet Take 300 mg by mouth daily.      [name] amitriptyline (ELAVIL) 100 MG tablet Take 50 mg by mouth at bedtime.       [name] amitriptyline (ELAVIL) 50 MG tablet Take 50 mg by mouth at bedtime.      [name] amLODipine (NORVASC) 5 MG tablet Take 5 mg by mouth daily.      . celecoxib (CELEBREX) 200 MG capsule Take 200 mg by mouth 2 (two) times daily.        . citalopram (CELEXA) 20 MG tablet Take 20 mg by mouth 2 (two) times daily as needed. 20 mg daily, may take additional 20 mg if needed for depression      . clopidogrel (PLAVIX) 75 MG tablet Take 1  tablet (75 mg total) by mouth daily.  30 tablet  11  . cyclobenzaprine (FLEXERIL) 10 MG tablet Take 10 mg by mouth 3 (three) times daily as needed. For muscle spasms      . digoxin (LANOXIN) 0.125 MG tablet TAKE 1 TABLET EVERY DAY  90 tablet  3  . donepezil (ARICEPT) 5 MG tablet Take 5 mg by mouth daily.       [name] enoxaparin (LOVENOX) 80 MG/0.8ML injection Inject 0.8 mLs (80 mg total) into the skin every 12 (twelve) hours.  10 Syringe  1  . famotidine (PEPCID) 20 MG tablet Take 20 mg by mouth daily.        . furosemide (LASIX) 20 MG tablet Take 20 mg by mouth every evening.      . furosemide (LASIX) 40 MG tablet Take 120 mg by mouth every morning.      [name] levothyroxine (SYNTHROID, LEVOTHROID) 25 MCG tablet Take 25 mcg by mouth daily.      . metoprolol succinate (TOPROL-XL) 25 MG 24 hr tablet Take 25 mg by mouth daily.      . potassium chloride SA  (K-DUR,KLOR-CON) 20 MEQ tablet Take 1 tablet (20 mEq total) by mouth 2 (two) times daily.  180 tablet  3  . valsartan (DIOVAN) 40 MG tablet Take 40 mg by mouth daily.      [name] warfarin (COUMADIN) 5 MG tablet Take 5-7.5 mg by mouth daily. Take 7.5mg  on Sun,Wed. Take 5mg  all other days      . albuterol (PROVENTIL HFA;VENTOLIN HFA) 108 (90 BASE) MCG/ACT inhaler Inhale 2 puffs into the lungs every 6 (six) hours as needed. For shortness of breath       Results for orders placed during the hospital encounter of 12/24/11  PROTIME-INR      Component Value Range   Prothrombin Time 12.6  11.6 - 15.2 seconds   INR 0.95  0.00 - 1.49  APTT      Component Value Range   aPTT 28  24 - 37 seconds   Labs: 12/23/2011       WBC  7.8  HGB 13.4  PLTS  245K   K 4.1  BUN  14  CREAT 0.73 Results for orders placed during the hospital encounter of 12/24/11 (from the past 48 hour(s))  PROTIME-INR     Status: Normal   Collection Time   12/24/11  7:01 AM      Component Value Range Comment   Prothrombin Time 12.6  11.6 - 15.2 seconds    INR 0.95  0.00 - 1.49   APTT     Status: Normal   Collection Time   12/24/11  7:01 AM      Component Value Range Comment   aPTT 28  24 - 37 seconds    Dg Chest 2 View  12/23/2011  *RADIOLOGY REPORT*  Clinical Data: Preop evaluation.  CHEST - 2 VIEW  Comparison: 05/03/2010  Findings: Postop CABG.  Heart size is normal.  Negative for heart failure.  COPD with pulmonary hyperinflation.  Negative for mass or pneumonia.  Chronic compression fracture of L1 is unchanged.  IMPRESSION: COPD without acute cardiopulmonary disease.   Original Report Authenticated By: Janeece Riggers, M.D.     Review of Systems  Constitutional: Negative for fever and chills.  HENT: Positive for hearing loss and neck pain.   Respiratory: Negative for cough and shortness of breath.   Cardiovascular: Negative for chest pain.  Gastrointestinal: Negative for nausea and abdominal  pain.       Occ vomiting and diarrhea   Musculoskeletal: Positive for back pain.       Rt shoulder pain  Neurological: Positive for dizziness. Negative for headaches.       Residual left sided weakness, balance difficulties    Blood pressure 143/75, pulse 71, temperature 97.7 F (36.5 C), temperature source Oral, resp. rate 18, SpO2 98.00%. Physical Exam  Constitutional: She is oriented to person, place, and time. She appears well-developed and well-nourished.  Cardiovascular: Normal rate.        irreg rhythm  Respiratory: Effort normal and breath sounds normal.  GI: Soft. Bowel sounds are normal. There is no tenderness.  Musculoskeletal:       extremities with arthritic changes in all digits, trace LE edema  Neurological: She is alert and oriented to person, place, and time.       Face symm, tongue midline, no sig drift, speech nl, finger to nose nl, FMM sl dim secondary to arthritic joints; PEERLA/EOMI;  Strength is sl dim on left, nl on right; sens fxn intact with exception of some mild left facial paresthesias  Skin: Skin is warm and dry.  Psychiatric: She has a normal mood and affect.     Assessment/Plan Patient with progressive symptomatic vertebral artery disease with findings of occluded right vertebral artery and high grade stenosis of left vertebral artery on recent MRI/MRA. Plan is for diagnostic cerebral arteriogram today with possible endovascular intervention (angioplasty/stenting) of origin of left vertebral artery if needed. Details/risks of procedure d/w pt/family with their understanding and consent. If intervention performed the pt will be admitted  to the neuro ICU for overnight neurological/hemodynamic monitoring.  Elmond Poehlman,D KEVIN 12/24/2011, 8:02 AM

## 2011-12-24 NOTE — ED Notes (Signed)
Urethral catheter removed 7 minutes after insertion due to latex allergy. Non-latex catheter placed. No reaction noted, pt tolerated well. Prophylactic Benadryl given per CRNA.

## 2011-12-24 NOTE — OR Nursing (Signed)
r groin has been soft/nontender since arrival pacu

## 2011-12-24 NOTE — Procedures (Signed)
S/P 4 vessel cerebral arteriogram  RT CFA approach  Findings  1.Approximately 35 % stenosis of LT vertbral artery at origin. 2.Occluded RT vertebral artery at origin.

## 2011-12-24 NOTE — Anesthesia Preprocedure Evaluation (Signed)
Anesthesia Evaluation  Patient identified by MRN, date of birth, ID band Patient awake    Reviewed: Allergy & Precautions, H&P , NPO status , Patient's Chart, lab work & pertinent test results  Airway Mallampati: II  Neck ROM: full    Dental   Pulmonary COPDformer smoker,          Cardiovascular hypertension, + CAD, + Past MI, + CABG and +CHF + dysrhythmias Atrial Fibrillation     Neuro/Psych Depression  Neuromuscular disease CVA    GI/Hepatic GERD-  ,  Endo/Other  Hypothyroidism   Renal/GU      Musculoskeletal  (+) Fibromyalgia -  Abdominal   Peds  Hematology   Anesthesia Other Findings   Reproductive/Obstetrics                           Anesthesia Physical Anesthesia Plan  ASA: III  Anesthesia Plan: General   Post-op Pain Management:    Induction: Intravenous  Airway Management Planned: Oral ETT  Additional Equipment: Arterial line  Intra-op Plan:   Post-operative Plan: Extubation in OR  Informed Consent: I have reviewed the patients History and Physical, chart, labs and discussed the procedure including the risks, benefits and alternatives for the proposed anesthesia with the patient or authorized representative who has indicated his/her understanding and acceptance.     Plan Discussed with: CRNA and Surgeon  Anesthesia Plan Comments:         Anesthesia Quick Evaluation

## 2011-12-24 NOTE — Progress Notes (Signed)
SPOKE WITH KEVIN WHO STATED HE WOULD SIGN CONSENT WITH PATIENT AND WOULD CLARIFY WITH DR. Corliss Skains   RE NIMODIPINE.

## 2011-12-25 ENCOUNTER — Encounter (HOSPITAL_COMMUNITY): Payer: Self-pay | Admitting: Radiology

## 2011-12-25 ENCOUNTER — Telehealth: Payer: Self-pay | Admitting: Cardiology

## 2011-12-25 NOTE — Telephone Encounter (Signed)
Potassium refill requested yesterday [facility], dont see request, called in for a verbal, needs refill

## 2011-12-26 ENCOUNTER — Ambulatory Visit (INDEPENDENT_AMBULATORY_CARE_PROVIDER_SITE_OTHER): Payer: Medicare Other | Admitting: *Deleted

## 2011-12-26 ENCOUNTER — Other Ambulatory Visit: Payer: Self-pay | Admitting: *Deleted

## 2011-12-26 ENCOUNTER — Other Ambulatory Visit: Payer: Self-pay

## 2011-12-26 DIAGNOSIS — E876 Hypokalemia: Secondary | ICD-10-CM

## 2011-12-26 DIAGNOSIS — I6350 Cerebral infarction due to unspecified occlusion or stenosis of unspecified cerebral artery: Secondary | ICD-10-CM

## 2011-12-26 DIAGNOSIS — I4891 Unspecified atrial fibrillation: Secondary | ICD-10-CM

## 2011-12-26 MED ORDER — POTASSIUM CHLORIDE CRYS ER 20 MEQ PO TBCR: 20.0000 meq | EXTENDED_RELEASE_TABLET | Freq: Two times a day (BID) | ORAL | Status: DC

## 2011-12-26 NOTE — Telephone Encounter (Signed)
RX verbally sent into pharmacy. Pt. Notified. CTuck

## 2011-12-29 ENCOUNTER — Ambulatory Visit (INDEPENDENT_AMBULATORY_CARE_PROVIDER_SITE_OTHER): Payer: Medicare Other

## 2011-12-29 DIAGNOSIS — I4891 Unspecified atrial fibrillation: Secondary | ICD-10-CM

## 2011-12-29 DIAGNOSIS — I6350 Cerebral infarction due to unspecified occlusion or stenosis of unspecified cerebral artery: Secondary | ICD-10-CM

## 2011-12-29 LAB — POCT INR: INR: 1.7

## 2011-12-29 NOTE — Anesthesia Postprocedure Evaluation (Signed)
Anesthesia Post Note  Patient: Amber [name]  Procedure(s) Performed: Procedure(s) (LRB): RADIOLOGY WITH ANESTHESIA (N/A)  Anesthesia type: MAC  Patient location: PACU  Post pain: Pain level controlled and Adequate analgesia  Post assessment: Post-op Vital signs reviewed, Patient's Cardiovascular Status Stable and Respiratory Function Stable  Last Vitals:  Filed Vitals:   12/24/11 1300  BP: 115/45  Pulse: 68  Temp:   Resp:     Post vital signs: Reviewed and stable  Level of consciousness: awake, alert  and oriented  Complications: No apparent anesthesia complications

## 2011-12-30 ENCOUNTER — Other Ambulatory Visit (HOSPITAL_COMMUNITY): Payer: Self-pay | Admitting: Interventional Radiology

## 2011-12-30 DIAGNOSIS — I6509 Occlusion and stenosis of unspecified vertebral artery: Secondary | ICD-10-CM

## 2012-01-05 ENCOUNTER — Ambulatory Visit (INDEPENDENT_AMBULATORY_CARE_PROVIDER_SITE_OTHER): Payer: Medicare Other | Admitting: *Deleted

## 2012-01-05 DIAGNOSIS — I6350 Cerebral infarction due to unspecified occlusion or stenosis of unspecified cerebral artery: Secondary | ICD-10-CM

## 2012-01-05 DIAGNOSIS — I4891 Unspecified atrial fibrillation: Secondary | ICD-10-CM

## 2012-01-05 LAB — POCT INR: INR: 2

## 2012-01-20 ENCOUNTER — Ambulatory Visit (INDEPENDENT_AMBULATORY_CARE_PROVIDER_SITE_OTHER): Payer: Medicare Other | Admitting: *Deleted

## 2012-01-20 DIAGNOSIS — I4891 Unspecified atrial fibrillation: Secondary | ICD-10-CM

## 2012-01-20 DIAGNOSIS — I6350 Cerebral infarction due to unspecified occlusion or stenosis of unspecified cerebral artery: Secondary | ICD-10-CM

## 2012-01-20 LAB — POCT INR: INR: 2.3

## 2012-02-06 ENCOUNTER — Ambulatory Visit (INDEPENDENT_AMBULATORY_CARE_PROVIDER_SITE_OTHER): Payer: Medicare Other | Admitting: *Deleted

## 2012-02-06 DIAGNOSIS — I4891 Unspecified atrial fibrillation: Secondary | ICD-10-CM

## 2012-02-06 DIAGNOSIS — I6350 Cerebral infarction due to unspecified occlusion or stenosis of unspecified cerebral artery: Secondary | ICD-10-CM

## 2012-02-09 ENCOUNTER — Other Ambulatory Visit: Payer: Self-pay

## 2012-02-09 MED ORDER — CLOPIDOGREL BISULFATE 75 MG PO TABS: 75.0000 mg | ORAL_TABLET | Freq: Every day | ORAL | Status: DC

## 2012-02-19 ENCOUNTER — Other Ambulatory Visit: Payer: Self-pay | Admitting: Cardiology

## 2012-02-19 NOTE — Telephone Encounter (Signed)
Refill error

## 2012-02-20 ENCOUNTER — Other Ambulatory Visit: Payer: Self-pay | Admitting: *Deleted

## 2012-02-20 MED ORDER — CITALOPRAM HYDROBROMIDE 20 MG PO TABS: 20.0000 mg | ORAL_TABLET | Freq: Every day | ORAL | Status: DC

## 2012-03-04 ENCOUNTER — Ambulatory Visit (INDEPENDENT_AMBULATORY_CARE_PROVIDER_SITE_OTHER): Payer: Medicare Other | Admitting: *Deleted

## 2012-03-04 DIAGNOSIS — I6350 Cerebral infarction due to unspecified occlusion or stenosis of unspecified cerebral artery: Secondary | ICD-10-CM

## 2012-03-04 DIAGNOSIS — I4891 Unspecified atrial fibrillation: Secondary | ICD-10-CM

## 2012-03-04 LAB — POCT INR: INR: 2.1

## 2012-03-22 ENCOUNTER — Other Ambulatory Visit: Payer: Medicare Other

## 2012-03-22 ENCOUNTER — Ambulatory Visit: Payer: Medicare Other | Admitting: Cardiology

## 2012-04-01 ENCOUNTER — Other Ambulatory Visit: Payer: Self-pay | Admitting: *Deleted

## 2012-04-01 ENCOUNTER — Ambulatory Visit: Payer: Medicare Other | Admitting: Cardiology

## 2012-04-01 ENCOUNTER — Other Ambulatory Visit: Payer: Medicare Other

## 2012-04-01 DIAGNOSIS — F329 Major depressive disorder, single episode, unspecified: Secondary | ICD-10-CM

## 2012-04-01 MED ORDER — CITALOPRAM HYDROBROMIDE 20 MG PO TABS: 20.0000 mg | ORAL_TABLET | Freq: Every day | ORAL | Status: DC

## 2012-04-01 NOTE — Telephone Encounter (Signed)
Patient is calling to ask if Dr Patty Sermons is about to refill her Celexa as he did before b/c the pharmacy is not able to get in touch with her pharmacy and she prefers Dr Patty Sermons to refill this medication. Patient states she has only seen her pcp twice and now her PCP is leaving so she's no longer her PCP. I will forward this message to Dr Patty Sermons and his nurse Juliette Alcide. I let the patient know there is no promise but I will relate and pass the message.

## 2012-04-09 ENCOUNTER — Other Ambulatory Visit: Payer: Self-pay

## 2012-04-09 ENCOUNTER — Ambulatory Visit: Payer: Self-pay | Admitting: Cardiology

## 2012-04-12 ENCOUNTER — Ambulatory Visit (INDEPENDENT_AMBULATORY_CARE_PROVIDER_SITE_OTHER): Payer: 59 | Admitting: *Deleted

## 2012-04-12 ENCOUNTER — Ambulatory Visit (INDEPENDENT_AMBULATORY_CARE_PROVIDER_SITE_OTHER): Payer: 59 | Admitting: Cardiology

## 2012-04-12 ENCOUNTER — Encounter: Payer: Self-pay | Admitting: Cardiology

## 2012-04-12 VITALS — BP 122/64 | HR 70 | Ht 64.5 in | Wt 175.8 lb

## 2012-04-12 DIAGNOSIS — E785 Hyperlipidemia, unspecified: Secondary | ICD-10-CM

## 2012-04-12 DIAGNOSIS — I6350 Cerebral infarction due to unspecified occlusion or stenosis of unspecified cerebral artery: Secondary | ICD-10-CM

## 2012-04-12 DIAGNOSIS — I119 Hypertensive heart disease without heart failure: Secondary | ICD-10-CM

## 2012-04-12 DIAGNOSIS — I4891 Unspecified atrial fibrillation: Secondary | ICD-10-CM

## 2012-04-12 DIAGNOSIS — D649 Anemia, unspecified: Secondary | ICD-10-CM

## 2012-04-12 DIAGNOSIS — I259 Chronic ischemic heart disease, unspecified: Secondary | ICD-10-CM

## 2012-04-12 DIAGNOSIS — F329 Major depressive disorder, single episode, unspecified: Secondary | ICD-10-CM

## 2012-04-12 LAB — POCT INR: INR: 2

## 2012-04-12 MED ORDER — CITALOPRAM HYDROBROMIDE 20 MG PO TABS: 20.0000 mg | ORAL_TABLET | Freq: Two times a day (BID) | ORAL | Status: DC

## 2012-04-12 NOTE — Assessment & Plan Note (Signed)
The patient has a history of depression and is taking citalopram 20 mg twice a day.  She is also on Aricept and states that her neurologist Dr. love told her that her last checkup showed some improvement in her memory functions.

## 2012-04-12 NOTE — Progress Notes (Signed)
Amber [name] Date of Birth:  [birth year] [facility] [address] [place], [place]  [address] [phone]         Fax   [phone]  History of Present Illness: This pleasant 78 year old woman is seen for a scheduled followup office visit. She has a complex past medical history. She has known ischemic heart disease with coronary artery bypass graft surgery in 2000. She has had remote stroke with residual left hemiparesthesias. She's had a long history of high blood pressure and high cholesterol. She has established atrial fibrillation and is on Coumadin. She has chronically elevated CK levels and she has a history of fibromyalgia. She does not tolerate statins. She's had congestive heart failure secondary to diastolic dysfunction. He was cardioverted in June 2012 but remained in sinus rhythm for less than one week. She is now in established chronic atrial fibrillation on Coumadin.  Recently she saw her orthopedist complaining of left neck pain. An MRI and MRA of the neck and head were obtained which showed significant vertebral artery stenosis.  She subsequently has been worked up by Dr. Kerby Nora.  Initially there were plans to possibly do an angioplasty of the affected vessels but on further review he has decided to follow them closely medically instead at this point.   Current Outpatient Prescriptions  Medication Sig Dispense Refill  . albuterol (PROVENTIL HFA;VENTOLIN HFA) 108 (90 BASE) MCG/ACT inhaler Inhale 2 puffs into the lungs every 6 (six) hours as needed. For shortness of breath      . alendronate (FOSAMAX) 70 MG tablet Take 70 mg by mouth every 7 (seven) days. Take with a full glass of water on an empty stomach. Sundays      . allopurinol (ZYLOPRIM) 300 MG tablet Take 300 mg by mouth daily.      [name] amitriptyline (ELAVIL) 25 MG tablet Take 50 mg by mouth at bedtime.      [name] amLODipine (NORVASC) 5 MG tablet Take 5 mg by mouth daily.      . celecoxib  (CELEBREX) 200 MG capsule Take 200 mg by mouth 2 (two) times daily.        . citalopram (CELEXA) 20 MG tablet Take 1 tablet (20 mg total) by mouth 2 (two) times daily.  60 tablet    . clopidogrel (PLAVIX) 75 MG tablet Take 1 tablet (75 mg total) by mouth daily.  30 tablet  11  . cyclobenzaprine (FLEXERIL) 10 MG tablet Take 10 mg by mouth 3 (three) times daily as needed. For muscle spasms      . digoxin (LANOXIN) 0.125 MG tablet TAKE 1 TABLET EVERY DAY  90 tablet  3  . donepezil (ARICEPT) 5 MG tablet Take 5 mg by mouth daily.       . famotidine (PEPCID) 20 MG tablet Take 20 mg by mouth as needed.       . furosemide (LASIX) 20 MG tablet Take 20 mg by mouth every evening.      . furosemide (LASIX) 40 MG tablet Take 120 mg by mouth every morning.      [name] levothyroxine (SYNTHROID, LEVOTHROID) 25 MCG tablet Take 25 mcg by mouth daily.      . metoprolol succinate (TOPROL-XL) 25 MG 24 hr tablet Take 25 mg by mouth daily.      . potassium chloride SA (K-DUR,KLOR-CON) 20 MEQ tablet Take 1 tablet (20 mEq total) by mouth 2 (two) times daily.  180 tablet  3  . valsartan (DIOVAN) 40  MG tablet Take 40 mg by mouth daily.      [name] warfarin (COUMADIN) 5 MG tablet Take 5-7.5 mg by mouth daily. Take 7.5mg  on Sun,Wed. Take 5mg  all other days      . [DISCONTINUED] albuterol (PROAIR HFA) 108 (90 BASE) MCG/ACT inhaler Inhale 2 puffs into the lungs every 6 (six) hours as needed for wheezing.  1 Inhaler  5   No current facility-administered medications for this visit.    Allergies  Allergen Reactions  . Ceclor (Cefaclor) Other (See Comments)    Reaction unknown  . Lipitor (Atorvastatin Calcium) Other (See Comments)    Increased fibromyalgia pain  . Mevacor (Lovastatin) Other (See Comments)    Increased fibromyalgia pain  . Pravachol Other (See Comments)    Increased fibromyalgia pain  . Vasotec Other (See Comments)    Reaction unknown  . Adhesive (Tape) Itching and Rash    Burning   . Latex Itching and Rash     Burning     Patient Active Problem List  Diagnosis  . Atrial fibrillation  . Unspecified cerebral artery occlusion with cerebral infarction  . Heart failure, diastolic, due to HTN  . Dyslipidemia  . Fibromyalgia  . Insomnia due to mental condition  . Depression  . Cervical spinal stenosis  . Hypothyroidism  . Osteoarthritis  . Osteoporosis  . Anemia  . Ischemic heart disease  . S/P CABG (coronary artery bypass graft)  . Benign hypertensive heart disease without heart failure  . COPD (chronic obstructive pulmonary disease)  . Diastolic heart failure secondary to hypertension  . Vertebral artery stenosis    History  Smoking status  . Former Smoker -- 1.50 packs/day for 25 years  . Types: Cigarettes  . Quit date: 02/10/1977  Smokeless tobacco  . Never Used    History  Alcohol Use No    No family history on file.  Review of Systems: Constitutional: no fever chills diaphoresis or fatigue or change in weight.  Head and neck: no hearing loss, no epistaxis, no photophobia or visual disturbance. Respiratory: No cough, shortness of breath or wheezing. Cardiovascular: No chest pain peripheral edema, palpitations. Gastrointestinal: No abdominal distention, no abdominal pain, no change in bowel habits hematochezia or melena. Genitourinary: No dysuria, no frequency, no urgency, no nocturia. Musculoskeletal:No arthralgias, no back pain, no gait disturbance or myalgias. Neurological: No dizziness, no headaches, no numbness, no seizures, no syncope, no weakness, no tremors. Hematologic: No lymphadenopathy, no easy bruising. Psychiatric: No confusion, no hallucinations, no sleep disturbance.    Physical Exam: Filed Vitals:   04/12/12 1501  BP: 122/64  Pulse: 70   the general appearance reveals a well-developed well-nourished elderly woman in no distress.  She has residual weakness on her hemiparetic right side.The head and neck exam reveals pupils equal and reactive.   Extraocular movements are full.  There is no scleral icterus.  The mouth and pharynx are normal.  The neck is supple.  The carotids reveal no bruits.  The jugular venous pressure is normal.  The  thyroid is not enlarged.  There is no lymphadenopathy.  The chest is clear to percussion and auscultation.  There are no rales or rhonchi.  Expansion of the chest is symmetrical.  The precordium is quiet.  The pulse is slightly irregular in atrial fibrillation clinically The first heart sound is normal.  The second heart sound is physiologically split.  There is no murmur gallop rub or click.  There is no abnormal lift or heave.  The abdomen is soft and nontender.  The bowel sounds are normal.  The liver and spleen are not enlarged.  There are no abdominal masses.  There are no abdominal bruits.  Extremities reveal good pedal pulses.  There is no phlebitis or edema.  There is no cyanosis or clubbing.    There is residual right-sided weakness There are no sensory deficits.  The skin is warm and dry.  There is no rash.     Assessment / Plan: The patient is to continue same medication.  She will be rechecked in 3 months for office visit EKG CBC hepatic function panel basal metabolic panel and fasting lipid panel.

## 2012-04-12 NOTE — Assessment & Plan Note (Signed)
The patient has not been experiencing any recurrent angina pectoris. 

## 2012-04-12 NOTE — Assessment & Plan Note (Signed)
Blood pressure was remaining stable on current therapy.  No symptoms of CHF 

## 2012-04-12 NOTE — Assessment & Plan Note (Signed)
The patient remains in atrial fibrillation on long-term Coumadin.  Her pulse is almost regular.  No further TIA or stroke symptoms.  She does have poor balance and has fallen 2 more times since we saw her in November.  She uses a walker at home because of poor balance.  She was recovering from a torn right Achilles tendon which occurred with one of her falls.  Dr. Lestine Box has been following her for this.

## 2012-04-12 NOTE — Patient Instructions (Addendum)
Your physician recommends that you schedule a follow-up appointment in: 3 months with ekg  Your physician recommends that you return for a FASTING lipid profile: 3 months lp,bmet, cbc hfp

## 2012-04-21 ENCOUNTER — Other Ambulatory Visit: Payer: Self-pay

## 2012-04-21 DIAGNOSIS — Z1231 Encounter for screening mammogram for malignant neoplasm of breast: Secondary | ICD-10-CM

## 2012-04-22 ENCOUNTER — Telehealth: Payer: Self-pay | Admitting: Cardiology

## 2012-04-22 MED ORDER — AZITHROMYCIN 250 MG PO TABS: ORAL_TABLET | ORAL | Status: DC

## 2012-04-22 NOTE — Telephone Encounter (Signed)
Advised patient

## 2012-04-22 NOTE — Telephone Encounter (Signed)
Pt wants rx for congestion, [facility] [address], pls call if problem [phone]

## 2012-04-22 NOTE — Telephone Encounter (Signed)
We will call in a Z-Pak as directed.  Also use Mucinex

## 2012-04-22 NOTE — Telephone Encounter (Signed)
Started with sore throat and scratchy throat, has moved into head. Now she is getting out green sputum and states just getting worse. Will forward to  Dr. Patty Sermons for review

## 2012-04-27 ENCOUNTER — Ambulatory Visit: Payer: Medicare Other

## 2012-05-06 ENCOUNTER — Other Ambulatory Visit: Payer: Self-pay | Admitting: Radiology

## 2012-05-06 DIAGNOSIS — I6529 Occlusion and stenosis of unspecified carotid artery: Secondary | ICD-10-CM

## 2012-05-07 ENCOUNTER — Ambulatory Visit (INDEPENDENT_AMBULATORY_CARE_PROVIDER_SITE_OTHER): Payer: 59 | Admitting: Pharmacist

## 2012-05-07 ENCOUNTER — Telehealth (HOSPITAL_COMMUNITY): Payer: Self-pay | Admitting: Interventional Radiology

## 2012-05-07 DIAGNOSIS — I6350 Cerebral infarction due to unspecified occlusion or stenosis of unspecified cerebral artery: Secondary | ICD-10-CM

## 2012-05-07 DIAGNOSIS — I4891 Unspecified atrial fibrillation: Secondary | ICD-10-CM

## 2012-05-26 ENCOUNTER — Ambulatory Visit (HOSPITAL_COMMUNITY)
Admission: RE | Admit: 2012-05-26 | Discharge: 2012-05-26 | Disposition: A | Discharge status: Home / Self Care | Payer: Medicare Other | Source: Ambulatory Visit | Attending: Interventional Radiology | Admitting: Interventional Radiology

## 2012-05-26 DIAGNOSIS — I6529 Occlusion and stenosis of unspecified carotid artery: Secondary | ICD-10-CM

## 2012-05-26 DIAGNOSIS — I6509 Occlusion and stenosis of unspecified vertebral artery: Secondary | ICD-10-CM

## 2012-05-26 DIAGNOSIS — I6502 Occlusion and stenosis of left vertebral artery: Secondary | ICD-10-CM

## 2012-05-26 NOTE — Progress Notes (Signed)
*  PRELIMINARY RESULTS* Vascular Ultrasound Carotid Duplex (Doppler) has been completed.  There is no obvious evidence of hemodynamically significant right internal carotid artery stenosis >40%. The left internal carotid artery demonstrates elevated velocities suggestive of a stenosis <40%. Unable to visualize the right vertebral artery. The left vertebral artery is patent with antegrade flow.  05/26/2012 10:28 AM Gertie Fey, RDMS, RDCS

## 2012-05-27 ENCOUNTER — Ambulatory Visit
Admission: RE | Admit: 2012-05-27 | Discharge: 2012-05-27 | Disposition: A | Discharge status: Home / Self Care | Payer: Medicare Other | Source: Ambulatory Visit

## 2012-05-27 DIAGNOSIS — Z1231 Encounter for screening mammogram for malignant neoplasm of breast: Secondary | ICD-10-CM

## 2012-06-03 ENCOUNTER — Encounter: Payer: Self-pay | Admitting: Cardiology

## 2012-06-04 ENCOUNTER — Other Ambulatory Visit: Payer: Self-pay

## 2012-06-04 MED ORDER — AMLODIPINE BESYLATE 5 MG PO TABS: 5.0000 mg | ORAL_TABLET | Freq: Every day | ORAL | Status: DC

## 2012-06-04 NOTE — Telephone Encounter (Signed)
..   Requested Prescriptions   Pending Prescriptions Disp Refills  . amLODipine (NORVASC) 5 MG tablet 30 tablet 10    Sig: Take 1 tablet (5 mg total) by mouth daily.

## 2012-06-07 ENCOUNTER — Telehealth (HOSPITAL_COMMUNITY): Payer: Self-pay | Admitting: Interventional Radiology

## 2012-06-18 ENCOUNTER — Ambulatory Visit (INDEPENDENT_AMBULATORY_CARE_PROVIDER_SITE_OTHER): Payer: 59

## 2012-06-18 ENCOUNTER — Other Ambulatory Visit: Payer: Self-pay | Admitting: *Deleted

## 2012-06-18 DIAGNOSIS — I6350 Cerebral infarction due to unspecified occlusion or stenosis of unspecified cerebral artery: Secondary | ICD-10-CM

## 2012-06-18 DIAGNOSIS — I4891 Unspecified atrial fibrillation: Secondary | ICD-10-CM

## 2012-06-18 MED ORDER — METOPROLOL SUCCINATE ER 25 MG PO TB24: ORAL_TABLET | ORAL | Status: DC

## 2012-06-18 MED ORDER — FUROSEMIDE 40 MG PO TABS: 120.0000 mg | ORAL_TABLET | Freq: Every morning | ORAL | Status: DC

## 2012-06-18 NOTE — Telephone Encounter (Signed)
Fax Received. Refill Completed. Amber [name] (R.M.A)   

## 2012-06-29 ENCOUNTER — Ambulatory Visit: Payer: Medicare Other | Admitting: Pulmonary Disease

## 2012-07-12 ENCOUNTER — Other Ambulatory Visit: Payer: Self-pay | Admitting: Pulmonary Disease

## 2012-07-13 ENCOUNTER — Encounter: Payer: Self-pay | Admitting: Cardiology

## 2012-07-13 ENCOUNTER — Ambulatory Visit (INDEPENDENT_AMBULATORY_CARE_PROVIDER_SITE_OTHER): Payer: 59 | Admitting: Cardiology

## 2012-07-13 ENCOUNTER — Ambulatory Visit (INDEPENDENT_AMBULATORY_CARE_PROVIDER_SITE_OTHER): Payer: 59 | Admitting: *Deleted

## 2012-07-13 VITALS — BP 124/68 | HR 68 | Ht 64.0 in | Wt 170.4 lb

## 2012-07-13 DIAGNOSIS — E785 Hyperlipidemia, unspecified: Secondary | ICD-10-CM

## 2012-07-13 DIAGNOSIS — I4891 Unspecified atrial fibrillation: Secondary | ICD-10-CM

## 2012-07-13 DIAGNOSIS — I259 Chronic ischemic heart disease, unspecified: Secondary | ICD-10-CM

## 2012-07-13 DIAGNOSIS — D649 Anemia, unspecified: Secondary | ICD-10-CM

## 2012-07-13 DIAGNOSIS — E7800 Pure hypercholesterolemia, unspecified: Secondary | ICD-10-CM

## 2012-07-13 DIAGNOSIS — I119 Hypertensive heart disease without heart failure: Secondary | ICD-10-CM

## 2012-07-13 DIAGNOSIS — I6350 Cerebral infarction due to unspecified occlusion or stenosis of unspecified cerebral artery: Secondary | ICD-10-CM

## 2012-07-13 LAB — POCT INR: INR: 1.9

## 2012-07-13 MED ORDER — LOSARTAN POTASSIUM 50 MG PO TABS: 50.0000 mg | ORAL_TABLET | Freq: Every day | ORAL | Status: DC

## 2012-07-13 NOTE — Assessment & Plan Note (Signed)
The patient has not been having any new symptoms from her established chronic atrial fibrillation.  No symptoms of CHF.  No TIA or stroke symptoms.  She remains on long-term Coumadin.

## 2012-07-13 NOTE — Assessment & Plan Note (Signed)
The patient has not been experiencing any chest pain or angina pectoris.  He has had some atypical high lateral left chest discomfort.  She does not have any nitroglycerin on hand and we sent her in a new bottle to her pharmacy today

## 2012-07-13 NOTE — Patient Instructions (Addendum)
Will obtain labs today and call you with the results (LP/BMET/HFP/CBC)  STOP DIOVAN AND START LOSARTAN 50 MG DAILY  Your physician wants you to follow-up in: 4 MONTHS You will receive a reminder letter in the mail two months in advance. If you don't receive a letter, please call our office to schedule the follow-up appointment.

## 2012-07-13 NOTE — Progress Notes (Signed)
Amber [name] Date of Birth:  1934-12-07 [facility] [address] [place], [place]  [address] [phone]         Fax   [phone]  History of Present Illness: This pleasant 78 year old woman is seen for a scheduled followup office visit. She has a complex past medical history. She has known ischemic heart disease with coronary artery bypass graft surgery in 2000. She has had remote stroke with residual left hemiparesthesias. She's had a long history of high blood pressure and high cholesterol. She has established atrial fibrillation and is on Coumadin. She has chronically elevated CK levels and she has a history of fibromyalgia. She does not tolerate statins. She's had congestive heart failure secondary to diastolic dysfunction. He was cardioverted in June 2012 but remained in sinus rhythm for less than one week. She is now in established chronic atrial fibrillation on Coumadin.  Recently she saw her orthopedist complaining of left neck pain. An MRI and MRA of the neck and head were obtained which showed significant vertebral artery stenosis. She subsequently has been worked up by Dr. Kerby Nora. Initially there were plans to possibly do an angioplasty of the affected vessels but on further review he has decided to follow them closely medically instead at this point.  The patient states that because of the affected artery is not significantly stenosed yet that insurance would not pay for the procedure.   Current Outpatient Prescriptions  Medication Sig Dispense Refill  . alendronate (FOSAMAX) 70 MG tablet Take 70 mg by mouth every 7 (seven) days. Take with a full glass of water on an empty stomach. Sundays      . allopurinol (ZYLOPRIM) 300 MG tablet Take 300 mg by mouth daily.      [name] amitriptyline (ELAVIL) 25 MG tablet Take 50 mg by mouth at bedtime.      [name] amLODipine (NORVASC) 5 MG tablet Take 1 tablet (5 mg total) by mouth daily.  30 tablet  10  . celecoxib  (CELEBREX) 200 MG capsule Take 200 mg by mouth 2 (two) times daily.        . citalopram (CELEXA) 20 MG tablet Take 1 tablet (20 mg total) by mouth 2 (two) times daily.  60 tablet    . clopidogrel (PLAVIX) 75 MG tablet Take 1 tablet (75 mg total) by mouth daily.  30 tablet  11  . cyclobenzaprine (FLEXERIL) 10 MG tablet Take 10 mg by mouth 3 (three) times daily as needed. For muscle spasms      . digoxin (LANOXIN) 0.125 MG tablet TAKE 1 TABLET EVERY DAY  90 tablet  3  . donepezil (ARICEPT) 5 MG tablet Take 5 mg by mouth daily.       . famotidine (PEPCID) 20 MG tablet Take 20 mg by mouth as needed.       . furosemide (LASIX) 40 MG tablet Take 3 tablets (120 mg total) by mouth every morning.  270 tablet  3  . levothyroxine (SYNTHROID, LEVOTHROID) 25 MCG tablet Take 25 mcg by mouth daily.      . metoprolol succinate (TOPROL-XL) 25 MG 24 hr tablet Taking three Times Daily  90 tablet  3  . potassium chloride SA (K-DUR,KLOR-CON) 20 MEQ tablet Take 1 tablet (20 mEq total) by mouth 2 (two) times daily.  180 tablet  3  . warfarin (COUMADIN) 5 MG tablet Take 5-7.5 mg by mouth daily. Take 7.5mg  on Sun,Wed. Take 5mg  all other days      .  albuterol (PROVENTIL HFA;VENTOLIN HFA) 108 (90 BASE) MCG/ACT inhaler Inhale 2 puffs into the lungs every 6 (six) hours as needed. For shortness of breath      . losartan (COZAAR) 50 MG tablet Take 1 tablet (50 mg total) by mouth daily.  90 tablet  3   No current facility-administered medications for this visit.    Allergies  Allergen Reactions  . Ceclor (Cefaclor) Other (See Comments)    Reaction unknown  . Lipitor (Atorvastatin Calcium) Other (See Comments)    Increased fibromyalgia pain  . Mevacor (Lovastatin) Other (See Comments)    Increased fibromyalgia pain  . Pravachol Other (See Comments)    Increased fibromyalgia pain  . Vasotec Other (See Comments)    Reaction unknown  . Adhesive (Tape) Itching and Rash    Burning   . Latex Itching and Rash    Burning       Patient Active Problem List   Diagnosis Date Noted  . Diastolic heart failure secondary to hypertension 06/27/2010    Priority: High  . Benign hypertensive heart disease without heart failure 05/29/2010    Priority: High  . Atrial fibrillation 05/06/2010    Priority: High  . Fibromyalgia 05/10/2010    Priority: Medium  . Anemia 05/10/2010    Priority: Medium  . Vertebral artery stenosis 12/18/2011  . COPD (chronic obstructive pulmonary disease) 06/04/2010  . Heart failure, diastolic, due to HTN 05/10/2010  . Dyslipidemia 05/10/2010  . Insomnia due to mental condition 05/10/2010  . Depression 05/10/2010  . Cervical spinal stenosis 05/10/2010  . Hypothyroidism 05/10/2010  . Osteoarthritis 05/10/2010  . Osteoporosis 05/10/2010  . Ischemic heart disease 05/10/2010  . S/P CABG (coronary artery bypass graft) 05/10/2010  . Unspecified cerebral artery occlusion with cerebral infarction 05/06/2010    History  Smoking status  . Former Smoker -- 1.50 packs/day for 25 years  . Types: Cigarettes  . Quit date: 02/10/1977  Smokeless tobacco  . Never Used    History  Alcohol Use No    No family history on file.  Review of Systems: Constitutional: no fever chills diaphoresis or fatigue or change in weight.  Head and neck: no hearing loss, no epistaxis, no photophobia or visual disturbance. Respiratory: No cough, shortness of breath or wheezing. Cardiovascular: No chest pain peripheral edema, palpitations. Gastrointestinal: No abdominal distention, no abdominal pain, no change in bowel habits hematochezia or melena. Genitourinary: No dysuria, no frequency, no urgency, no nocturia. Musculoskeletal:No arthralgias, no back pain, no gait disturbance or myalgias. Neurological: No dizziness, no headaches, no numbness, no seizures, no syncope, no weakness, no tremors. Hematologic: No lymphadenopathy, no easy bruising. Psychiatric: No confusion, no hallucinations, no sleep  disturbance.    Physical Exam: Filed Vitals:   07/13/12 1601  BP: 124/68  Pulse: 68   the general appearance reveals a well-developed well-nourished elderly woman in no distress.The head and neck exam reveals pupils equal and reactive.  Extraocular movements are full.  There is no scleral icterus.  The mouth and pharynx are normal.  The neck is supple.  The carotids reveal no bruits.  The jugular venous pressure is normal.  The  thyroid is not enlarged.  There is no lymphadenopathy.  The chest is clear to percussion and auscultation.  There are no rales or rhonchi.  Expansion of the chest is symmetrical.  The precordium is quiet.  The first heart sound is normal.  The second heart sound is physiologically split.  There is no murmur gallop rub or  click.  There is no abnormal lift or heave.  The abdomen is soft and nontender.  The bowel sounds are normal.  The liver and spleen are not enlarged.  There are no abdominal masses.  There are no abdominal bruits.  Extremities reveal good pedal pulses.  There is no phlebitis or edema.  There is no cyanosis or clubbing.  Strength is decreased in her right upper arm and in her left lower leg and she wears a brace on her left lower leg. There is no lateralizing weakness.  There are no sensory deficits.  The skin is warm and dry.  There is no rash.  EKG shows atrial fibrillation with widespread ST and T-wave abnormalities unchanged from 06/19/11.   Assessment / Plan: Continue same medication except switch from Diovan to losartan. Blood work today he is being drawn and is fasting. Return in 4 months for followup office visit.

## 2012-07-13 NOTE — Assessment & Plan Note (Signed)
The patient has a history of high blood pressure with diastolic dysfunction she has been on Diovan which has been too expensive for her and we are switching her to losartan 50 mg one daily today.

## 2012-07-14 ENCOUNTER — Encounter: Payer: Self-pay | Admitting: Neurology

## 2012-07-14 LAB — BASIC METABOLIC PANEL
CO2: 27 mEq/L (ref 19–32)
Chloride: 103 mEq/L (ref 96–112)
Potassium: 4.2 mEq/L (ref 3.5–5.1)
Sodium: 142 mEq/L (ref 135–145)

## 2012-07-14 LAB — HEPATIC FUNCTION PANEL
ALT: 27 U/L (ref 0–35)
AST: 41 U/L — ABNORMAL HIGH (ref 0–37)
Albumin: 4.4 g/dL (ref 3.5–5.2)
Total Protein: 7.3 g/dL (ref 6.0–8.3)

## 2012-07-14 LAB — CBC
RDW: 15.5 % — ABNORMAL HIGH (ref 11.5–14.6)
WBC: 8.8 10*3/uL (ref 4.5–10.5)

## 2012-07-14 LAB — LIPID PANEL
Cholesterol: 334 mg/dL — ABNORMAL HIGH (ref 0–200)
HDL: 40.7 mg/dL (ref >39.00)
Total CHOL/HDL Ratio: 8
Triglycerides: 349 mg/dL — ABNORMAL HIGH (ref 0.0–149.0)

## 2012-07-14 LAB — LDL CHOLESTEROL, DIRECT: Direct LDL: 193.5 mg/dL

## 2012-07-14 NOTE — Progress Notes (Signed)
Quick Note:  Please report to patient. The recent labs are stable. Continue same medication and careful diet. No anemia. BS better 104. Lipids about the same. ______

## 2012-07-15 ENCOUNTER — Telehealth: Payer: Self-pay | Admitting: *Deleted

## 2012-07-15 NOTE — Telephone Encounter (Signed)
Advised patient of lab results and mailed copy 

## 2012-07-15 NOTE — Telephone Encounter (Signed)
Message copied by Burnell Blanks on Thu Jul 15, 2012  3:43 PM ------      Message from: Cassell Clement      Created: Wed Jul 14, 2012  8:45 PM       Please report to patient.  The recent labs are stable. Continue same medication and careful diet.  No anemia. BS better 104. Lipids about the same. ------

## 2012-07-25 ENCOUNTER — Other Ambulatory Visit: Payer: Self-pay | Admitting: Cardiology

## 2012-07-29 ENCOUNTER — Other Ambulatory Visit: Payer: Self-pay

## 2012-07-29 DIAGNOSIS — I639 Cerebral infarction, unspecified: Secondary | ICD-10-CM

## 2012-07-29 DIAGNOSIS — G309 Alzheimer's disease, unspecified: Secondary | ICD-10-CM

## 2012-07-29 DIAGNOSIS — I119 Hypertensive heart disease without heart failure: Secondary | ICD-10-CM

## 2012-07-29 DIAGNOSIS — E876 Hypokalemia: Secondary | ICD-10-CM

## 2012-07-29 DIAGNOSIS — I4891 Unspecified atrial fibrillation: Secondary | ICD-10-CM

## 2012-07-29 MED ORDER — DONEPEZIL HCL 5 MG PO TABS: 5.0000 mg | ORAL_TABLET | Freq: Every day | ORAL | Status: DC

## 2012-07-29 NOTE — Telephone Encounter (Signed)
Former [facility] patient assigned to Dr Athar.  

## 2012-08-06 ENCOUNTER — Ambulatory Visit (INDEPENDENT_AMBULATORY_CARE_PROVIDER_SITE_OTHER)
Admission: RE | Admit: 2012-08-06 | Discharge: 2012-08-06 | Disposition: A | Discharge status: Home / Self Care | Payer: Medicare Other | Source: Ambulatory Visit | Attending: Pulmonary Disease | Admitting: Pulmonary Disease

## 2012-08-06 ENCOUNTER — Encounter: Payer: Self-pay | Admitting: Pulmonary Disease

## 2012-08-06 ENCOUNTER — Ambulatory Visit (INDEPENDENT_AMBULATORY_CARE_PROVIDER_SITE_OTHER): Payer: 59 | Admitting: Pulmonary Disease

## 2012-08-06 VITALS — BP 124/84 | HR 68 | Temp 98.7°F | Ht 64.0 in | Wt 164.0 lb

## 2012-08-06 DIAGNOSIS — J449 Chronic obstructive pulmonary disease, unspecified: Secondary | ICD-10-CM

## 2012-08-06 DIAGNOSIS — R0600 Dyspnea, unspecified: Secondary | ICD-10-CM | POA: Insufficient documentation

## 2012-08-06 DIAGNOSIS — R0989 Other specified symptoms and signs involving the circulatory and respiratory systems: Secondary | ICD-10-CM

## 2012-08-06 NOTE — Assessment & Plan Note (Signed)
She has progressive dyspnea after recent change to cozaar.  Her chest xray is consistent with heart failure.  I have advised her to take an additional 40 mg lasix daily through the weekend, and call Dr. Jenness Corner office next week to discuss in more details.

## 2012-08-06 NOTE — Assessment & Plan Note (Signed)
I don't think her current symptoms are related to her COPD.  Will defer further adjustments to her inhaler regimen until after she is seen by cardiology again.

## 2012-08-06 NOTE — Progress Notes (Signed)
Chief Complaint  Patient presents with  . COPD    Breathing has gotten worse. Reports DOE, wheezing, chest tightness and hoarseness. Denies coughing.    History of Present Illness: Amber [name] is a 78 y.o. female with COPD.  She was doing well until 3 weeks ago.  She was seen by cardiology earlier this month.  She was changed to cozaar.  Since then she have been getting more short of breath, and feel like she can't take a deep breath.  Her daughter hears her wheezing.  She has tried using albuterol, but this does not help much.  She has also been getting hoarse.  She denies sinus congestion, sore throat, or reflux.  She sometimes gets a heavy feeling in the middle of her chest, and this goes away on its own.  She is not having chest discomfort at present.  She has not notice leg swelling.  She had difficulty doing spirometry today.  TESTS: PFT 05/02/10 > >FEV1 1.35(62%), FEV1% 66, DLCO 75% CT chest 04/28/10 >> Emphysema Room air ONO 06/20/11 >> Test time 6 hrs.  SpO2 mean 93.7%, SpO2 low 80%.  Spent 28 sec with SpO2 < 88%  Amber [name]  has a past medical history of Coronary artery disease; Hypertension; Hyperlipidemia; Hypothyroidism; Depression; Insomnia; Fibromyalgia; Gout; Osteoarthritis; Osteoporosis; Cervical spinal stenosis; Nasal polyposis; Pneumonia; COPD with emphysema; Myocardial infarction; CHF (congestive heart failure); Atrial fibrillation; CVA (cerebral vascular accident); GERD (gastroesophageal reflux disease); dizziness; Intermittent confusion; and Cancer.  Amber [name]  has past surgical history that includes Abdominal hysterectomy; Breast lumpectomy (1980s); carpel tunnel; Tonsillectomy; Eye surgery; Coronary artery bypass graft (2000); and Radiology with anesthesia (12/24/2011).  Prior to Admission medications   Medication Sig Start Date End Date Taking? Authorizing Provider  alendronate (FOSAMAX) 70 MG tablet Take 70 mg by mouth every 7 (seven) days. Take with a  full glass of water on an empty stomach. Sundays   Yes Historical Provider, MD  allopurinol (ZYLOPRIM) 300 MG tablet Take 300 mg by mouth daily.   Yes Historical Provider, MD  amitriptyline (ELAVIL) 25 MG tablet Take 50 mg by mouth at bedtime.   Yes Historical Provider, MD  amLODipine (NORVASC) 5 MG tablet Take 1 tablet (5 mg total) by mouth daily. 06/04/12  Yes Cassell Clement, MD  celecoxib (CELEBREX) 200 MG capsule Take 200 mg by mouth 2 (two) times daily.     Yes Historical Provider, MD  citalopram (CELEXA) 20 MG tablet Take 1 tablet (20 mg total) by mouth 2 (two) times daily. 04/12/12  Yes Cassell Clement, MD  clopidogrel (PLAVIX) 75 MG tablet Take 1 tablet (75 mg total) by mouth daily. 02/09/12  Yes Cassell Clement, MD  COUMADIN 5 MG tablet Take as directed by anticoagulation clinic 07/26/12  Yes Cassell Clement, MD  cyclobenzaprine (FLEXERIL) 10 MG tablet Take 10 mg by mouth 3 (three) times daily as needed. For muscle spasms 12/01/11  Yes Historical Provider, MD  digoxin (LANOXIN) 0.125 MG tablet TAKE 1 TABLET EVERY DAY 09/25/11  Yes Cassell Clement, MD  donepezil (ARICEPT) 5 MG tablet Take 1 tablet (5 mg total) by mouth daily. 07/29/12  Yes Huston Foley, MD  famotidine (PEPCID) 20 MG tablet Take 20 mg by mouth as needed.    Yes Historical Provider, MD  furosemide (LASIX) 40 MG tablet Take 3 tablets (120 mg total) by mouth every morning. 06/18/12  Yes Cassell Clement, MD  levothyroxine (SYNTHROID, LEVOTHROID) 25 MCG tablet Take 25 mcg by mouth daily.  Yes Historical Provider, MD  losartan (COZAAR) 50 MG tablet Take 1 tablet (50 mg total) by mouth daily. 07/13/12  Yes Cassell Clement, MD  metoprolol succinate (TOPROL-XL) 25 MG 24 hr tablet Taking three Times Daily 06/18/12  Yes Cassell Clement, MD  potassium chloride SA (K-DUR,KLOR-CON) 20 MEQ tablet Take 1 tablet (20 mEq total) by mouth 2 (two) times daily. 12/26/11  Yes Cassell Clement, MD  PROAIR HFA 108 (90 BASE) MCG/ACT inhaler TAKE 2  INHALATIONS INTO LUNGS EVERY SIX HOURS AS NEEDED FOR WHEEZING 07/12/12  Yes Coralyn Helling, MD  albuterol (PROVENTIL HFA;VENTOLIN HFA) 108 (90 BASE) MCG/ACT inhaler Inhale 2 puffs into the lungs every 6 (six) hours as needed. For shortness of breath 06/18/11 06/17/12  Coralyn Helling, MD    Allergies  Allergen Reactions  . Ceclor (Cefaclor) Other (See Comments)    Reaction unknown  . Lipitor (Atorvastatin Calcium) Other (See Comments)    Increased fibromyalgia pain  . Mevacor (Lovastatin) Other (See Comments)    Increased fibromyalgia pain  . Pravachol Other (See Comments)    Increased fibromyalgia pain  . Vasotec Other (See Comments)    Reaction unknown  . Adhesive (Tape) Itching and Rash    Burning   . Latex Itching and Rash    Burning      Physical Exam:  General - No distress ENT - No sinus tenderness, no oral exudate, no LAN Cardiac - s1s2 regular, no murmur Chest - basilar rales Back - No focal tenderness Abd - Soft, non-tender Ext - No edema Neuro - Normal strength Skin - No rashes Psych - normal mood, and behavior  Dg Chest 2 View  08/06/2012   *RADIOLOGY REPORT*  Clinical Data: Shortness of breath.  Chest pain, hypertension.  CHEST - 2 VIEW  Comparison: 12/23/2011.  Findings: The heart, mediastinum and hilar contours are normal. Previous median sternotomy.  The patient has developed some interstitial thickening in the perihilar regions and at the bases. There are also bilateral small pleural effusions.  There are no pneumothoraces or acute bony changes.  IMPRESSION: Findings most consistent with mild congestive heart failure with mild pulmonary interstitial edema and small bilateral pleural effusions.   Original Report Authenticated By: Sander Radon, M.D.      Assessment/Plan:  Coralyn Helling, MD [facility] Pager:  [phone]

## 2012-08-06 NOTE — Patient Instructions (Signed)
Continue lasix 120 mg in AM Take 60 mg lasix in PM for next three days Call Dr. Jenness Corner office on 6/30 to discuss further plans for lasix and cozaar Follow up in 2 months

## 2012-08-09 ENCOUNTER — Telehealth: Payer: Self-pay | Admitting: Cardiology

## 2012-08-09 NOTE — Telephone Encounter (Signed)
Discussed with  Dr. Patty Sermons and she will continue extra Lasix and he will see patient Wednesday am. Patient advised and stated her weight is down 2 pounds and breathing some better.

## 2012-08-09 NOTE — Telephone Encounter (Signed)
New Problem:    Patient called in because she saw her lung doctor recently, he took a chest X-ray, saw that she had fluid on her lungs, upped her Lasix, and told her to call in to be seen by anyone of our cardiologist today to see how to proceed.  Please call back.

## 2012-08-10 ENCOUNTER — Encounter: Payer: Self-pay | Admitting: Neurology

## 2012-08-10 ENCOUNTER — Ambulatory Visit (INDEPENDENT_AMBULATORY_CARE_PROVIDER_SITE_OTHER): Payer: Self-pay | Admitting: Neurology

## 2012-08-10 VITALS — BP 139/59 | HR 66 | Temp 97.0°F | Ht 65.25 in | Wt 169.0 lb

## 2012-08-10 DIAGNOSIS — G309 Alzheimer's disease, unspecified: Secondary | ICD-10-CM

## 2012-08-10 DIAGNOSIS — I119 Hypertensive heart disease without heart failure: Secondary | ICD-10-CM

## 2012-08-10 DIAGNOSIS — G609 Hereditary and idiopathic neuropathy, unspecified: Secondary | ICD-10-CM

## 2012-08-10 DIAGNOSIS — E876 Hypokalemia: Secondary | ICD-10-CM

## 2012-08-10 DIAGNOSIS — F0280 Dementia in other diseases classified elsewhere without behavioral disturbance: Secondary | ICD-10-CM

## 2012-08-10 DIAGNOSIS — I4891 Unspecified atrial fibrillation: Secondary | ICD-10-CM

## 2012-08-10 DIAGNOSIS — R269 Unspecified abnormalities of gait and mobility: Secondary | ICD-10-CM

## 2012-08-10 DIAGNOSIS — I639 Cerebral infarction, unspecified: Secondary | ICD-10-CM

## 2012-08-10 DIAGNOSIS — I6350 Cerebral infarction due to unspecified occlusion or stenosis of unspecified cerebral artery: Secondary | ICD-10-CM

## 2012-08-10 DIAGNOSIS — R2689 Other abnormalities of gait and mobility: Secondary | ICD-10-CM

## 2012-08-10 MED ORDER — DONEPEZIL HCL 5 MG PO TABS: 5.0000 mg | ORAL_TABLET | Freq: Every day | ORAL | Status: DC

## 2012-08-10 NOTE — Progress Notes (Signed)
Subjective:    Patient ID: Amber [name] is a 78 y.o. female.  HPI  Interim history:   Amber [name] is a very pleasant 78 year old right-handed woman who presents for followup consultation of her recurrent TIAs, history of strokes, memory loss, peripheral neuropathy with gait disorder, and hx of HAs. The patient is unaccompanied today. This is her first visit with me and she previously had seen Dr. Fayrene Fearing love and was last seen by him on 03/05/2012, at which time he asked her not to drive. She was at fall risk. She was having symptoms of dizziness and facial numbness which he felt were vascular in origin. He did not increase her Aricept since had had hallucinations in the past with it. Her falls assessment tool score was 19 at the time. She has an underlying complicated medical history of heart disease including CHF, status post bypass surgery, pontine stroke with left hemiparesis in 2000, right-sided TIA in 2009, fibromyalgia, arthritis, hypertension, hyperlipidemia, atrial fibrillation, depression, lower back pain with degenerative joint disease and occlusion of right vertebral artery. She is currently on donepezil 5 mg daily, Diovan, potassium, metoprolol long-acting 25 mg daily, Synthroid 25 mcg daily, Lanoxin, furosemide, Plavix, Celexa, Celebrex, amlodipine, amitriptyline 50 mg daily, allopurinol 300 mg daily, Fosamax once a week. Her pulmonologist, Dr. Craige Cotta, increased her Lasix to 180 mg daily for now and she is going to see her cardiologist, Dr. Patty Sermons, tomorrow. The only recent change in her medication was from Diovan (too expensive) to Losartan.  She has had SOB and a pressure-like sensation in her chest for the past 2 21/2 weeks. She had a CXR which showed. She reports being off balance since her stroke and has been using a AFO on the L for the past 2 years. She fell and ruptured her Achille's tendon in December 2013. She has not driven in a while. Her husband dropped her off today.   I  reviewed Dr. Imagene Gurney prior notes and the patient's records and below is a summary of that review:  77 year old right-handed woman with a history of right pontine stroke with left hemiparesis in 2000. She had a right brain TIA in 2009. MRI brain showed right pontine stroke, intracranial MRA was normal and MR a of the neck showed mild carotid bifurcation irregularity right greater than left and proximal right vertebral stenosis greater than 70%, possibly as much is 90% with mild narrowing of the left vertebral artery at its origin. She has neck pain and has had elevated CPK levels in the past in the [phone] range. She has had poor tolerance to statin medications. She has atrial fibrillation, mitral valve prolapse, coronary artery bypass surgery in 1999, peripheral neuropathy, fibromyalgia, degenerative spine disease and hypertension. She was admitted to the hospital in March 2012 with congestive heart failure. She has had memory loss. In 09/2010 her MMSE was 27. Arteriography showed 100% blockage of the right vertebral artery and 30% blockage of the proximal left vertebral artery. She has had dizzy spells. In 02/2012 her MMSE was 22, clock drawing was 4, and animal fluency was 13.  Her Past Medical History Is Significant For: Past Medical History  Diagnosis Date  . Coronary artery disease   . Hypertension   . Hyperlipidemia   . Hypothyroidism   . Depression   . Insomnia   . Fibromyalgia   . Gout   . Osteoarthritis   . Osteoporosis   . Cervical spinal stenosis   . Nasal polyposis   . Pneumonia  1990  . COPD with emphysema     PFT 05/02/10>>FEV1 1.35(62%), FEV1% 66, DLCO 75%  . Myocardial infarction   . CHF (congestive heart failure)   . Atrial fibrillation   . CVA (cerebral vascular accident)     left sided weakness  . GERD (gastroesophageal reflux disease)     pepcid 2-3 times per week  . Hx of dizziness   . Intermittent confusion   . Cancer     squamous cell, melanoma    Her Past  Surgical History Is Significant For: Past Surgical History  Procedure Laterality Date  . Abdominal hysterectomy    . Breast lumpectomy  1980s    Benign lesion - right  . Carpel tunnel      right  . Tonsillectomy    . Eye surgery      bilateral cataracts  . Coronary artery bypass graft  2000  . Radiology with anesthesia  12/24/2011    Procedure: RADIOLOGY WITH ANESTHESIA;  Surgeon: Medication Radiologist, MD;  Location: [facility] OR;  Service: Radiology;  Laterality: N/A;  Extra Cranial Vascular Stent    Her Family History Is Significant For: No family history on file.  Her Social History Is Significant For: History   Social History  . Marital Status: Married    Spouse Name: N/A    Number of Children: N/A  . Years of Education: N/A   Occupational History  . [employer]   . Secretary     Retired   Social History Main Topics  . Smoking status: Former Smoker -- 1.50 packs/day for 25 years    Types: Cigarettes    Quit date: 02/10/1977  . Smokeless tobacco: Never Used  . Alcohol Use: No  . Drug Use: No  . Sexually Active: None   Other Topics Concern  . None   Social History Narrative  . None    Her Allergies Are:  Allergies  Allergen Reactions  . Ceclor (Cefaclor) Other (See Comments)    Reaction unknown  . Lipitor (Atorvastatin Calcium) Other (See Comments)    Increased fibromyalgia pain  . Mevacor (Lovastatin) Other (See Comments)    Increased fibromyalgia pain  . Pravachol Other (See Comments)    Increased fibromyalgia pain  . Vasotec Other (See Comments)    Reaction unknown  . Adhesive (Tape) Itching and Rash    Burning   . Latex Itching and Rash    Burning   :   Her Current Medications Are:  Outpatient Encounter Prescriptions as of 08/10/2012  Medication Sig Dispense Refill  . alendronate (FOSAMAX) 70 MG tablet Take 70 mg by mouth every 7 (seven) days. Take with a full glass of water on an empty stomach. Sundays      . allopurinol (ZYLOPRIM)  300 MG tablet Take 300 mg by mouth daily.      [name] amitriptyline (ELAVIL) 25 MG tablet Take 50 mg by mouth at bedtime.      [name] amLODipine (NORVASC) 5 MG tablet Take 1 tablet (5 mg total) by mouth daily.  30 tablet  10  . celecoxib (CELEBREX) 200 MG capsule Take 200 mg by mouth 2 (two) times daily.        . citalopram (CELEXA) 20 MG tablet Take 1 tablet (20 mg total) by mouth 2 (two) times daily.  60 tablet    . clopidogrel (PLAVIX) 75 MG tablet Take 1 tablet (75 mg total) by mouth daily.  30 tablet  11  . COUMADIN 5 MG tablet Take  as directed by anticoagulation clinic  120 tablet  1  . cyclobenzaprine (FLEXERIL) 10 MG tablet Take 10 mg by mouth 3 (three) times daily as needed. For muscle spasms      . digoxin (LANOXIN) 0.125 MG tablet TAKE 1 TABLET EVERY DAY  90 tablet  3  . donepezil (ARICEPT) 5 MG tablet Take 1 tablet (5 mg total) by mouth daily.  90 tablet  1  . famotidine (PEPCID) 20 MG tablet Take 20 mg by mouth as needed.       . furosemide (LASIX) 40 MG tablet Take 3 tablets (120 mg total) by mouth every morning.  270 tablet  3  . levothyroxine (SYNTHROID, LEVOTHROID) 25 MCG tablet Take 25 mcg by mouth daily.      [name] losartan (COZAAR) 50 MG tablet Take 1 tablet (50 mg total) by mouth daily.  90 tablet  3  . metoprolol succinate (TOPROL-XL) 25 MG 24 hr tablet Taking three Times Daily  90 tablet  3  . potassium chloride SA (K-DUR,KLOR-CON) 20 MEQ tablet Take 1 tablet (20 mEq total) by mouth 2 (two) times daily.  180 tablet  3  . PROAIR HFA 108 (90 BASE) MCG/ACT inhaler TAKE 2 INHALATIONS INTO LUNGS EVERY SIX HOURS AS NEEDED FOR WHEEZING  8.5 each  0  . [DISCONTINUED] donepezil (ARICEPT) 5 MG tablet Take 1 tablet (5 mg total) by mouth daily.  90 tablet  1  . albuterol (PROVENTIL HFA;VENTOLIN HFA) 108 (90 BASE) MCG/ACT inhaler Inhale 2 puffs into the lungs every 6 (six) hours as needed. For shortness of breath       No facility-administered encounter medications on file as of 08/10/2012.   Review  of Systems  HENT: Positive for hearing loss.   Cardiovascular: Positive for leg swelling.  Musculoskeletal: Positive for myalgias and arthralgias.  Neurological:       Memory loss  Psychiatric/Behavioral: Positive for sleep disturbance.    Objective:  Neurologic Exam  Physical Exam Physical Examination:   Filed Vitals:   08/10/12 0937  BP: 139/59  Pulse: 66  Temp: 97 F (36.1 C)    General Examination: The patient is a very pleasant 78 y.o. female in no acute distress. She is calm and cooperative with the exam. She denies Auditory Hallucinations and Visual Hallucinations.   HEENT: Normocephalic, atraumatic, pupils are equal, round and reactive to light and accommodation. Funduscopic exam is normal with sharp disc margins noted. Extraocular tracking shows mild saccadic breakdown without nystagmus noted. Hearing is intact. Tympanic membranes are clear bilaterally. Face is symmetric with no facial masking and normal facial sensation. There is no lip, neck or jaw tremor. Neck is not rigid with intact passive ROM. There are no carotid bruits on auscultation. Oropharynx exam reveals mild mouth dryness. No significant airway crowding is noted. Mallampati is class II. Tongue protrudes centrally and palate elevates symmetrically.    Chest: is clear to auscultation without wheezing, rhonchi or crackles noted.  Heart: sounds are regular and normal without murmurs, rubs or gallops noted.   Abdomen: is soft, non-tender and non-distended with normal bowel sounds appreciated on auscultation.  Extremities: There is no pitting edema in the distal lower extremities bilaterally. L foot is in an AFO. She uses a single prong cane.   Skin: is warm and dry with no trophic changes noted.  Musculoskeletal: exam reveals no obvious joint deformities, tenderness or joint swelling or erythema.  Neurologically:  Mental status: The patient is awake and alert, paying good  attention. She is able to  completely provide the history. She is oriented to: person, place, time/date, situation, day of week, month of year and year. Her memory, attention, language and knowledge are impaired mildly. There is no aphasia, agnosia, apraxia or anomia. There is a no significant degree of bradyphrenia. Speech is mildly hypophonic with no dysarthria noted. Mood is congruent and affect is normal.   Cranial nerves are as described above under HEENT exam. In addition, shoulder shrug is normal with equal shoulder height noted.  Motor exam: Normal bulk, and strength for age is noted. Tone is not rigid with absence of cogwheeling. There is overall no significant bradykinesia. There is no drift or rebound. There is no tremor.   Romberg is positive. Reflexes are 1+ in the upper extremities and 1+ in the lower extremities, but absent in her ankle on the R. Fine motor skills: Finger taps, hand movements, and rapid alternating patting are mildly impaired bilaterally. Foot taps and foot agility are mildly impaired bilaterally.   Cerebellar testing shows no dysmetria or intention tremor on finger to nose testing. Heel to shin is unremarkable. There is no truncal or gait ataxia.   Sensory exam is intact to light touch, pinprick, vibration, temperature sense in the upper extremities, with mild decreased vibration and pinprick sense in the distal lower extremities.   Gait, station and balance: She stands up from the seated position with moderate difficulty and needs to push herself up and needs about 3 attempts.. No veering to one side is noted. No leaning to one side. Posture is mildly stooped. Stance is wide-based. She walks slowly and cautiously with the help of her cane held in her right hand. She turns in 3 steps. Tandem walk is not possible. Balance is mildly impaired.     Assessment and Plan:   Assessment and Plan:  In summary, MICHEALE SCHLACK is a very pleasant 78 y.o.-year old female with a complicated medical history  of heart disease including CHF with recent exacerbation, heart d/s, status post bypass surgery, pontine stroke with left hemiparesis in 2000, right-sided TIA in 2009, fibromyalgia, arthritis, hypertension, hyperlipidemia, atrial fibrillation, depression, lower back pain with degenerative joint disease and occlusion of right vertebral artery. She presents for followup consultation of her recurrent TIAs, history of strokes, memory loss, peripheral neuropathy with gait disorder. Her exam is stable, when I compare my findings with Dr. Imagene Gurney notes. She is able to tolerate Aricept at 5 mg once in the morning. If she takes it after lunch she has bad dreams and hallucinations she states. She is advised that secondary prevention is keep after her stroke. She is encouraged to continue her medical management. She is encouraged to use her walker and exercise within her own limitations. She will be taking up swimming again. Since she is relatively stable from the neurological standpoint the suggestive a 4 month followup with one of my associates. We are trying to get Dr. Imagene Gurney patient situated with the best possible care and the physician with a subspecialty training that would fit the patient's needs. She understands this and was in agreement. I refilled her Aricept prescription today. I encouraged her to call with any interim questions, concerns, problems, updates and refill requests.

## 2012-08-10 NOTE — Patient Instructions (Addendum)
I think overall you are doing fairly well but I do want to suggest a few things today:  Remember to drink plenty of fluid, eat healthy meals and do not skip any meals. Try to eat protein with a every meal and eat a healthy snack such as fruit or nuts in between meals. Try to keep a regular sleep-wake schedule and try to exercise daily, particularly in the form of walking, 20-30 minutes a day, if you can.   Engage in social activities in your community and with your family and try to keep up with current events by reading the newspaper or watching the news.   As far as your medications are concerned, I would like to suggest no changes.    As far as diagnostic testing: no new changes.   I would suggest a FU in about 4 months with one of my associates. Alverda Skeans, our nurse will be calling you for your appointment.   Brett Canales is my clinical assistant and will answer any of your questions and relay your messages to me and also relay most of my messages to you.   Our phone number is [phone]. We also have an after hours call service for urgent matters and there is a physician on-call for urgent questions. For any emergencies you know to call 911 or go to the nearest emergency room.

## 2012-08-11 ENCOUNTER — Ambulatory Visit (INDEPENDENT_AMBULATORY_CARE_PROVIDER_SITE_OTHER): Payer: 59 | Admitting: Cardiology

## 2012-08-11 ENCOUNTER — Encounter: Payer: Self-pay | Admitting: Cardiology

## 2012-08-11 ENCOUNTER — Ambulatory Visit (INDEPENDENT_AMBULATORY_CARE_PROVIDER_SITE_OTHER): Payer: 59 | Admitting: *Deleted

## 2012-08-11 VITALS — BP 122/70 | HR 64 | Ht 64.0 in | Wt 169.8 lb

## 2012-08-11 DIAGNOSIS — I4891 Unspecified atrial fibrillation: Secondary | ICD-10-CM

## 2012-08-11 DIAGNOSIS — I110 Hypertensive heart disease with heart failure: Secondary | ICD-10-CM

## 2012-08-11 DIAGNOSIS — I6350 Cerebral infarction due to unspecified occlusion or stenosis of unspecified cerebral artery: Secondary | ICD-10-CM

## 2012-08-11 DIAGNOSIS — R0609 Other forms of dyspnea: Secondary | ICD-10-CM

## 2012-08-11 DIAGNOSIS — I119 Hypertensive heart disease without heart failure: Secondary | ICD-10-CM

## 2012-08-11 DIAGNOSIS — I259 Chronic ischemic heart disease, unspecified: Secondary | ICD-10-CM

## 2012-08-11 DIAGNOSIS — I5030 Unspecified diastolic (congestive) heart failure: Secondary | ICD-10-CM

## 2012-08-11 LAB — HEPATIC FUNCTION PANEL
AST: 28 U/L (ref 0–37)
Albumin: 4.5 g/dL (ref 3.5–5.2)
Alkaline Phosphatase: 47 U/L (ref 39–117)
Bilirubin, Direct: 0.1 mg/dL (ref 0.0–0.3)
Total Protein: 7.5 g/dL (ref 6.0–8.3)

## 2012-08-11 LAB — BASIC METABOLIC PANEL
CO2: 32 mEq/L (ref 19–32)
GFR: 67.31 mL/min (ref >60.00)
Glucose, Bld: 118 mg/dL — ABNORMAL HIGH (ref 70–99)
Potassium: 4.5 mEq/L (ref 3.5–5.1)
Sodium: 139 mEq/L (ref 135–145)

## 2012-08-11 LAB — LIPID PANEL: Total CHOL/HDL Ratio: 7

## 2012-08-11 MED ORDER — VALSARTAN 40 MG PO TABS: 40.0000 mg | ORAL_TABLET | Freq: Every day | ORAL | Status: DC

## 2012-08-11 NOTE — Patient Instructions (Signed)
Will obtain labs today and call you with the results (bmet)  DISCONTINUE YOUR LOSARTAN AND RESUME DIOVAN 40 MG DAILY  Return soon for an echo  Your physician has requested that you have an echocardiogram. Echocardiography is a painless test that uses sound waves to create  images of your heart. It provides your doctor with information about the size and shape of your heart and how well your heart's chambers  and valves are working. This procedure takes approximately one hour. There are no restrictions for this procedure.  Your physician recommends that you schedule a follow-up appointment in: 1 month ov/bmet

## 2012-08-11 NOTE — Assessment & Plan Note (Signed)
The patient has been experiencing some pressure in her chest which has improved since her Lasix was increased.

## 2012-08-11 NOTE — Progress Notes (Signed)
Quick Note:  Please report to patient. The recent labs are stable. Continue same medication and careful diet. Lipids about the same. Kidney function and K are good. ______

## 2012-08-11 NOTE — Progress Notes (Signed)
Amber [name] Date of Birth:  1934-12-30 [facility] [address] [place], [place]  [address] [phone]         Fax   [phone]  History of Present Illness: This pleasant 78 year old woman is seen for a work in office visit. She has a complex past medical history. She has known ischemic heart disease with coronary artery bypass graft surgery in 2000. She has had remote stroke with residual left hemiparesthesias. She's had a long history of high blood pressure and high cholesterol. She has established atrial fibrillation and is on Coumadin. She has chronically elevated CK levels and she has a history of fibromyalgia. She does not tolerate statins. She's had congestive heart failure secondary to diastolic dysfunction. He was cardioverted in June 2012 but remained in sinus rhythm for less than one week. She is now in established chronic atrial fibrillation on Coumadin.  The patient has a history of COPD.  She recently saw Dr. Craige Cotta who felt that most of her dyspnea was probably related to worsening congestive heart failure rather than her lung disease.  A chest x-ray on 08/06/12 showed x-ray findings of mild CHF.  The patient has improved since increasing her furosemide to a total daily dose of 180 mg.  She does feel that she has not done as well since we switched her from Diovan to losartan because of cost issues.  She has not been experiencing any awareness of her heart rhythm or any TIA symptoms.   Current Outpatient Prescriptions  Medication Sig Dispense Refill  . alendronate (FOSAMAX) 70 MG tablet Take 70 mg by mouth every 7 (seven) days. Take with a full glass of water on an empty stomach. Sundays      . allopurinol (ZYLOPRIM) 300 MG tablet Take 300 mg by mouth daily.      [name] amitriptyline (ELAVIL) 25 MG tablet Take 50 mg by mouth at bedtime.      [name] amLODipine (NORVASC) 5 MG tablet Take 1 tablet (5 mg total) by mouth daily.  30 tablet  10  . celecoxib (CELEBREX) 200  MG capsule Take 200 mg by mouth 2 (two) times daily.        . citalopram (CELEXA) 20 MG tablet Take 1 tablet (20 mg total) by mouth 2 (two) times daily.  60 tablet    . clopidogrel (PLAVIX) 75 MG tablet Take 1 tablet (75 mg total) by mouth daily.  30 tablet  11  . COUMADIN 5 MG tablet Take as directed by anticoagulation clinic  120 tablet  1  . cyclobenzaprine (FLEXERIL) 10 MG tablet Take 10 mg by mouth 3 (three) times daily as needed. For muscle spasms      . digoxin (LANOXIN) 0.125 MG tablet TAKE 1 TABLET EVERY DAY  90 tablet  3  . donepezil (ARICEPT) 5 MG tablet Take 1 tablet (5 mg total) by mouth daily.  90 tablet  1  . famotidine (PEPCID) 20 MG tablet Take 20 mg by mouth as needed.       . furosemide (LASIX) 40 MG tablet Take 180 mg by mouth daily.      [name] levothyroxine (SYNTHROID, LEVOTHROID) 25 MCG tablet Take 25 mcg by mouth daily.      . metoprolol succinate (TOPROL-XL) 25 MG 24 hr tablet Taking three Times Daily  90 tablet  3  . potassium chloride SA (K-DUR,KLOR-CON) 20 MEQ tablet Take 1 tablet (20 mEq total) by mouth 2 (two) times daily.  180 tablet  3  . PROAIR HFA 108 (90 BASE) MCG/ACT inhaler TAKE 2 INHALATIONS INTO LUNGS EVERY SIX HOURS AS NEEDED FOR WHEEZING  8.5 each  0  . albuterol (PROVENTIL HFA;VENTOLIN HFA) 108 (90 BASE) MCG/ACT inhaler Inhale 2 puffs into the lungs every 6 (six) hours as needed. For shortness of breath      . valsartan (DIOVAN) 40 MG tablet Take 1 tablet (40 mg total) by mouth daily.  90 tablet  3   No current facility-administered medications for this visit.    Allergies  Allergen Reactions  . Ceclor (Cefaclor) Other (See Comments)    Reaction unknown  . Lipitor (Atorvastatin Calcium) Other (See Comments)    Increased fibromyalgia pain  . Mevacor (Lovastatin) Other (See Comments)    Increased fibromyalgia pain  . Pravachol Other (See Comments)    Increased fibromyalgia pain  . Vasotec Other (See Comments)    Reaction unknown  . Adhesive (Tape)  Itching and Rash    Burning   . Latex Itching and Rash    Burning     Patient Active Problem List   Diagnosis Date Noted  . Diastolic heart failure secondary to hypertension 06/27/2010    Priority: High  . Benign hypertensive heart disease without heart failure 05/29/2010    Priority: High  . Atrial fibrillation 05/06/2010    Priority: High  . Fibromyalgia 05/10/2010    Priority: Medium  . Anemia 05/10/2010    Priority: Medium  . Dyspnea 08/06/2012  . Vertebral artery stenosis 12/18/2011  . COPD (chronic obstructive pulmonary disease) 06/04/2010  . Heart failure, diastolic, due to HTN 05/10/2010  . Dyslipidemia 05/10/2010  . Insomnia due to mental condition 05/10/2010  . Depression 05/10/2010  . Cervical spinal stenosis 05/10/2010  . Hypothyroidism 05/10/2010  . Osteoarthritis 05/10/2010  . Osteoporosis 05/10/2010  . Ischemic heart disease 05/10/2010  . S/P CABG (coronary artery bypass graft) 05/10/2010  . Unspecified cerebral artery occlusion with cerebral infarction 05/06/2010    History  Smoking status  . Former Smoker -- 1.50 packs/day for 25 years  . Types: Cigarettes  . Quit date: 02/10/1977  Smokeless tobacco  . Never Used    History  Alcohol Use No    No family history on file.  Review of Systems: Constitutional: no fever chills diaphoresis or fatigue or change in weight.  Head and neck: no hearing loss, no epistaxis, no photophobia or visual disturbance. Respiratory: No cough, shortness of breath or wheezing. Cardiovascular: No chest pain peripheral edema, palpitations. Gastrointestinal: No abdominal distention, no abdominal pain, no change in bowel habits hematochezia or melena. Genitourinary: No dysuria, no frequency, no urgency, no nocturia. Musculoskeletal:No arthralgias, no back pain, no gait disturbance or myalgias. Neurological: No dizziness, no headaches, no numbness, no seizures, no syncope, no weakness, no tremors. Hematologic: No  lymphadenopathy, no easy bruising. Psychiatric: No confusion, no hallucinations, no sleep disturbance.    Physical Exam: Filed Vitals:   08/11/12 0844  BP: 122/70  Pulse: 64   the general appearance reveals a well-developed well-nourished woman in no distress.The head and neck exam reveals pupils equal and reactive.  Extraocular movements are full.  There is no scleral icterus.  The mouth and pharynx are normal.  The neck is supple.  The carotids reveal no bruits.  The jugular venous pressure is normal.  The  thyroid is not enlarged.  There is no lymphadenopathy.  The chest is clear to percussion and auscultation.  There are no rales or rhonchi.  Expansion of the  chest is symmetrical.  The precordium is quiet.  The first heart sound is normal.  The second heart sound is physiologically split.  There is no murmur gallop rub or click.  There is no abnormal lift or heave.  The abdomen is soft and nontender.  The bowel sounds are normal.  The liver and spleen are not enlarged.  There are no abdominal masses.  There are no abdominal bruits.  Extremities reveal good pedal pulses.  There is no phlebitis or significant edema.  There is no cyanosis or clubbing.  Left leg is in a brace  There are no sensory deficits.  The skin is warm and dry.  There is no rash.     Assessment / Plan: We will switch her back to her previous dose of Diovan 40 mg daily and stop losartan.  We will check in basal metabolic panel today to be sure her kidney function and potassium are okay.  We will arrange for a two-dimensional echocardiogram.  Continue same Lasix.  Recheck in one month for followup office visit and basal metabolic panel

## 2012-08-11 NOTE — Assessment & Plan Note (Signed)
Patient remains in chronic atrial fibrillation with controlled ventricular response.  No new TIA or stroke symptoms.

## 2012-08-11 NOTE — Assessment & Plan Note (Signed)
Her last echocardiogram was in October 2009 at which time she had an ejection fraction of 50%.  We will update her echocardiogram

## 2012-08-12 ENCOUNTER — Ambulatory Visit (HOSPITAL_COMMUNITY): Payer: Medicare Other | Attending: Cardiovascular Disease | Admitting: Radiology

## 2012-08-12 ENCOUNTER — Telehealth: Payer: Self-pay | Admitting: *Deleted

## 2012-08-12 DIAGNOSIS — E785 Hyperlipidemia, unspecified: Secondary | ICD-10-CM | POA: Insufficient documentation

## 2012-08-12 DIAGNOSIS — I4891 Unspecified atrial fibrillation: Secondary | ICD-10-CM

## 2012-08-12 DIAGNOSIS — I509 Heart failure, unspecified: Secondary | ICD-10-CM

## 2012-08-12 DIAGNOSIS — Z87891 Personal history of nicotine dependence: Secondary | ICD-10-CM | POA: Insufficient documentation

## 2012-08-12 DIAGNOSIS — I2589 Other forms of chronic ischemic heart disease: Secondary | ICD-10-CM

## 2012-08-12 DIAGNOSIS — R0602 Shortness of breath: Secondary | ICD-10-CM | POA: Insufficient documentation

## 2012-08-12 DIAGNOSIS — I110 Hypertensive heart disease with heart failure: Secondary | ICD-10-CM

## 2012-08-12 DIAGNOSIS — Z951 Presence of aortocoronary bypass graft: Secondary | ICD-10-CM | POA: Insufficient documentation

## 2012-08-12 DIAGNOSIS — I259 Chronic ischemic heart disease, unspecified: Secondary | ICD-10-CM

## 2012-08-12 NOTE — Telephone Encounter (Signed)
Mailed copy of labs and left message to call if any questions  

## 2012-08-12 NOTE — Telephone Encounter (Signed)
Message copied by Burnell Blanks on Thu Aug 12, 2012  9:11 AM ------      Message from: Cassell Clement      Created: Wed Aug 11, 2012  9:35 PM       Please report to patient.  The recent labs are stable. Continue same medication and careful diet. Lipids about the same. Kidney function and K are good. ------

## 2012-08-12 NOTE — Progress Notes (Signed)
Echocardiogram performed.  

## 2012-08-16 ENCOUNTER — Telehealth: Payer: Self-pay | Admitting: *Deleted

## 2012-08-16 NOTE — Telephone Encounter (Signed)
Message copied by Burnell Blanks on Mon Aug 16, 2012 10:59 AM ------      Message from: Cassell Clement      Created: Thu Aug 12, 2012  9:14 PM       Please report.  2D echo is satisfactory.  EF 60-65% ------

## 2012-08-16 NOTE — Telephone Encounter (Signed)
Advised patient of results.  

## 2012-08-18 ENCOUNTER — Telehealth: Payer: Self-pay | Admitting: Cardiology

## 2012-08-18 ENCOUNTER — Encounter: Payer: Self-pay | Admitting: *Deleted

## 2012-08-18 NOTE — Telephone Encounter (Signed)
Will send letter for mychart to patient as requested

## 2012-08-18 NOTE — Telephone Encounter (Signed)
Left message to call back  

## 2012-08-18 NOTE — Telephone Encounter (Signed)
New Problem:    Called in wanting to know how to go about setting up the patient's mychart.  Please call back.

## 2012-08-20 ENCOUNTER — Telehealth: Payer: Self-pay | Admitting: Cardiology

## 2012-08-20 DIAGNOSIS — I259 Chronic ischemic heart disease, unspecified: Secondary | ICD-10-CM

## 2012-08-20 DIAGNOSIS — I4891 Unspecified atrial fibrillation: Secondary | ICD-10-CM

## 2012-08-20 DIAGNOSIS — R079 Chest pain, unspecified: Secondary | ICD-10-CM

## 2012-08-20 NOTE — Telephone Encounter (Signed)
I spoke with the pt's daughter and she states the pt made her aware that she feels her SOB is worse since she saw Dr Patty Sermons, the pt continues to complain of ongoing symptoms of a "foot standing on her chest". Yesterday when the pt walked from the parking lot into [employer] the pt developed chest pressure that radiated into her neck. This symptom is similar to what the pt felt prior to CABG. The pt rested and symptoms resolved. [name] daughter is very concerned that we are missing something since the pt continues to have symptoms.  She would like the pt seen today.  I will speak with the DOD about this patient.

## 2012-08-20 NOTE — Telephone Encounter (Signed)
Per Dr [place] (DOD) the pt should take it easy over the weekend and avoid exertion.  The pt will also be scheduled for a Lexiscan Myoview next week. Appointment made on 08/24/12 at 9:45 for myoview. I spoke with the daughter and made her aware of Dr Elvis Coil orders.  I did review Myoview instructions by phone with the daughter and she will give the pt these instructions.

## 2012-08-20 NOTE — Telephone Encounter (Signed)
New problem  Pt daughter says that she is still feeling a pressure in her chest that goes up to her neck. She said she spoke with the doctor the last time she was and it feels like it is getting worse.

## 2012-08-24 ENCOUNTER — Ambulatory Visit (HOSPITAL_COMMUNITY): Payer: Medicare Other | Attending: Cardiovascular Disease | Admitting: Radiology

## 2012-08-24 VITALS — BP 151/69 | HR 57 | Ht 64.0 in | Wt 155.0 lb

## 2012-08-24 DIAGNOSIS — R0609 Other forms of dyspnea: Secondary | ICD-10-CM | POA: Insufficient documentation

## 2012-08-24 DIAGNOSIS — Z87891 Personal history of nicotine dependence: Secondary | ICD-10-CM | POA: Insufficient documentation

## 2012-08-24 DIAGNOSIS — I10 Essential (primary) hypertension: Secondary | ICD-10-CM | POA: Insufficient documentation

## 2012-08-24 DIAGNOSIS — I4891 Unspecified atrial fibrillation: Secondary | ICD-10-CM | POA: Insufficient documentation

## 2012-08-24 DIAGNOSIS — Z8673 Personal history of transient ischemic attack (TIA), and cerebral infarction without residual deficits: Secondary | ICD-10-CM | POA: Insufficient documentation

## 2012-08-24 DIAGNOSIS — R110 Nausea: Secondary | ICD-10-CM | POA: Insufficient documentation

## 2012-08-24 DIAGNOSIS — I779 Disorder of arteries and arterioles, unspecified: Secondary | ICD-10-CM | POA: Insufficient documentation

## 2012-08-24 DIAGNOSIS — R5381 Other malaise: Secondary | ICD-10-CM | POA: Insufficient documentation

## 2012-08-24 DIAGNOSIS — R5383 Other fatigue: Secondary | ICD-10-CM | POA: Insufficient documentation

## 2012-08-24 DIAGNOSIS — R55 Syncope and collapse: Secondary | ICD-10-CM | POA: Insufficient documentation

## 2012-08-24 DIAGNOSIS — I509 Heart failure, unspecified: Secondary | ICD-10-CM | POA: Insufficient documentation

## 2012-08-24 DIAGNOSIS — R42 Dizziness and giddiness: Secondary | ICD-10-CM | POA: Insufficient documentation

## 2012-08-24 DIAGNOSIS — R079 Chest pain, unspecified: Secondary | ICD-10-CM

## 2012-08-24 DIAGNOSIS — R0989 Other specified symptoms and signs involving the circulatory and respiratory systems: Secondary | ICD-10-CM | POA: Insufficient documentation

## 2012-08-24 DIAGNOSIS — I259 Chronic ischemic heart disease, unspecified: Secondary | ICD-10-CM

## 2012-08-24 DIAGNOSIS — R0789 Other chest pain: Secondary | ICD-10-CM | POA: Insufficient documentation

## 2012-08-24 DIAGNOSIS — E785 Hyperlipidemia, unspecified: Secondary | ICD-10-CM | POA: Insufficient documentation

## 2012-08-24 DIAGNOSIS — Z8249 Family history of ischemic heart disease and other diseases of the circulatory system: Secondary | ICD-10-CM | POA: Insufficient documentation

## 2012-08-24 DIAGNOSIS — I2510 Atherosclerotic heart disease of native coronary artery without angina pectoris: Secondary | ICD-10-CM

## 2012-08-24 DIAGNOSIS — Z951 Presence of aortocoronary bypass graft: Secondary | ICD-10-CM | POA: Insufficient documentation

## 2012-08-24 DIAGNOSIS — R0602 Shortness of breath: Secondary | ICD-10-CM

## 2012-08-24 DIAGNOSIS — I739 Peripheral vascular disease, unspecified: Secondary | ICD-10-CM | POA: Insufficient documentation

## 2012-08-24 MED ORDER — TECHNETIUM TC 99M SESTAMIBI GENERIC - CARDIOLITE
10.0000 | Freq: Once | INTRAVENOUS | Status: AC | PRN
  Administered 2012-08-24: 10 via INTRAVENOUS

## 2012-08-24 MED ORDER — TECHNETIUM TC 99M SESTAMIBI GENERIC - CARDIOLITE
30.0000 | Freq: Once | INTRAVENOUS | Status: AC | PRN
  Administered 2012-08-24: 30 via INTRAVENOUS

## 2012-08-24 MED ORDER — REGADENOSON 0.4 MG/5ML IV SOLN
0.4000 mg | Freq: Once | INTRAVENOUS | Status: AC
  Administered 2012-08-24: 0.4 mg via INTRAVENOUS

## 2012-08-24 NOTE — Progress Notes (Signed)
[facility] SITE 3 NUCLEAR MED [address] [place], [place] [address] [phone]    Cardiology Nuclear Med Study  Amber [name] is a 78 y.o. female     MRN : [id number]     DOB: 1934/06/04  Procedure Date: 08/24/2012  Nuclear Med Background Indication for Stress Test:  Evaluation for Ischemia and Graft Patency History: AFIB, CHF, ICM,  '00 Cath> CABG, 05-01-10 Myocardial Perfusion Study-No scar, no ischemia, EF=70% at [facility], and 07-2010 Cardioversion, and 08-12-12 Echo: EF=60-65% Cardiac Risk Factors: Carotid Disease, CVA, Family History - CAD, History of Smoking, Hypertension, Lipids, PVD and TIA  Symptoms: Chest Pressure and Tightness with/without exertion (has been continuous > 1 week),  Dizziness, DOE, Fatigue, Fatigue with Exertion, Light-Headedness, Nausea, Near Syncope and SOB   Nuclear Pre-Procedure Caffeine/Decaff Intake:  None> 12 hrs NPO After: 7:00pm   Lungs:  clear O2 Sat: 98% on room air. IV 0.9% NS with Angio Cath:  22g  IV Site: R Hand x 1, tolerated well IV Started by:  Irean Hong, RN  Chest Size (in):  36 Cup Size: D  Height: 5\' 4"  (1.626 m)  Weight:  469 lb (70.308 kg)  BMI:  Body mass index is 26.59 kg/(m^2). Tech Comments:  Took Toprol this am    Nuclear Med Study 1 or 2 day study: 1 day  Stress Test Type:  Lexiscan  Reading MD: Charlton Haws, MD  Order Authorizing Provider:  Cassell Clement, MD  Resting Radionuclide: Technetium 3m Sestamibi  Resting Radionuclide Dose: 10.8 mCi   Stress Radionuclide:  Technetium 63m Sestamibi  Stress Radionuclide Dose: 33.0 mCi           Stress Protocol Rest HR: 57 Stress HR: 75  Rest BP: 151/69 Stress BP: 156/64  Exercise Time (min): n/a METS: n/a   Predicted Max HR: 145 bpm % Max HR: 51.72 bpm Rate Pressure Product: 62952   Dose of Adenosine (mg):  n/a Dose of Lexiscan: 0.4 mg  Dose of Atropine (mg): n/a Dose of Dobutamine: n/a mcg/kg/min (at max HR)  Stress Test Technologist: Irean Hong,  RN  Nuclear Technologist:  Harlow Asa, CNMT     Rest Procedure:  Myocardial perfusion imaging was performed at rest 45 minutes following the intravenous administration of Technetium 44m Sestamibi. Rest ECG: Afib with nonspecific ST T wave changes  Stress Procedure:  The patient received IV Lexiscan 0.4 mg over 15-seconds.  Technetium 94m Sestamibi injected at 30-seconds. The patient complains of continuous chest tightness > one week that was more pronounced with Lexiscan, improved in recovery. Quantitative spect images were obtained after a 45 minute delay. Stress ECG: No significant change from baseline ECG  QPS Raw Data Images:  Normal; no motion artifact; normal heart/lung ratio. Stress Images:  There is decreased uptake in the inferior wall. Rest Images:  There is decreased uptake in the inferior wall. Subtraction (SDS):  There is a fixed defect that is most consistent with a previous infarction. Transient Ischemic Dilatation (Normal <1.22):  *n/a** Lung/Heart Ratio (Normal <0.45):  0.39  Quantitative Gated Spect Images QGS EDV:  n/a ml QGS ESV:  n/a ml  Impression Exercise Capacity:  Lexiscan with no exercise. BP Response:  Normal blood pressure response. Clinical Symptoms:  No significant symptoms noted. ECG Impression:  No significant ST segment change suggestive of ischemia. Comparison with Prior Nuclear Study: No images to compare  Overall Impression:  Low risk stress nuclear study Moderate sized inferior wall infarct at mid and basal level  woth no ischemia.  LV Ejection Fraction: Study not gated.  LV Wall Motion:  Afib no gating   Charlton Haws

## 2012-08-25 ENCOUNTER — Telehealth: Payer: Self-pay | Admitting: Cardiology

## 2012-08-25 NOTE — Telephone Encounter (Signed)
Spoke with patient and she was unsure who called her. Did discuss making a follow up appointment with  Dr. Patty Sermons and she would rather wait until he looks at nuclear study. Will discuss with him next week. Patient states she is feeling about the same

## 2012-08-25 NOTE — Telephone Encounter (Signed)
New Prob     Pt has some questions regarding a referral. Please call.

## 2012-09-02 ENCOUNTER — Other Ambulatory Visit: Payer: Self-pay | Admitting: Cardiology

## 2012-09-03 NOTE — Telephone Encounter (Signed)
Left message to call back  

## 2012-09-13 ENCOUNTER — Ambulatory Visit (INDEPENDENT_AMBULATORY_CARE_PROVIDER_SITE_OTHER): Payer: 59 | Admitting: *Deleted

## 2012-09-13 ENCOUNTER — Ambulatory Visit (INDEPENDENT_AMBULATORY_CARE_PROVIDER_SITE_OTHER): Payer: 59 | Admitting: Cardiology

## 2012-09-13 ENCOUNTER — Encounter: Payer: Self-pay | Admitting: Cardiology

## 2012-09-13 VITALS — BP 134/76 | HR 64 | Ht 64.0 in | Wt 170.8 lb

## 2012-09-13 DIAGNOSIS — I4891 Unspecified atrial fibrillation: Secondary | ICD-10-CM

## 2012-09-13 DIAGNOSIS — I6350 Cerebral infarction due to unspecified occlusion or stenosis of unspecified cerebral artery: Secondary | ICD-10-CM

## 2012-09-13 DIAGNOSIS — I110 Hypertensive heart disease with heart failure: Secondary | ICD-10-CM

## 2012-09-13 DIAGNOSIS — I5030 Unspecified diastolic (congestive) heart failure: Secondary | ICD-10-CM

## 2012-09-13 DIAGNOSIS — I259 Chronic ischemic heart disease, unspecified: Secondary | ICD-10-CM

## 2012-09-13 MED ORDER — NITROGLYCERIN 0.4 MG SL SUBL: 0.4000 mg | SUBLINGUAL_TABLET | SUBLINGUAL | Status: DC | PRN

## 2012-09-13 NOTE — Assessment & Plan Note (Signed)
Since last visit her  dyspnea has improved.  Initially she was taking Lasix 180 mg daily but she began to feel weak and on her own cut back to her previous dose of 160 mg daily and feels better.

## 2012-09-13 NOTE — Progress Notes (Signed)
Amber [name] Date of Birth:  02-03-1935 [facility] [address] [place], [place]  [address] [phone]         Fax   [phone]  History of Present Illness: This pleasant 78 year old woman is seen for a scheduled followup office visit. She has a complex past medical history. She has known ischemic heart disease with coronary artery bypass graft surgery in 2000. She has had remote stroke with residual left hemiparesis. She's had a long history of high blood pressure and high cholesterol. She has established atrial fibrillation and is on Coumadin. She has chronically elevated CK levels and she has a history of fibromyalgia. She does not tolerate statins. She's had congestive heart failure secondary to diastolic dysfunction.  She was cardioverted in June 2012 but remained in sinus rhythm for less than one week. She is now in established chronic atrial fibrillation on Coumadin.  The patient has a history of COPD. She recently saw Dr. Craige Cotta who felt that most of her dyspnea was probably related to worsening congestive heart failure rather than her lung disease. A chest x-ray on 08/06/12 showed x-ray findings of mild CHF. The patient has improved since increasing her furosemide to a total daily dose of 160 mg. previously we had switched her from Diovan to losartan because of cost .  However, she did not do as well and we switched her back to Diovan and she feels better .  The patient still has very little stamina when she walks. Since last visit the patient had an echocardiogram on 08/12/12 showing an ejection fraction of 60-65%.  She also had a Lexa scan Myoview on 08/25/12 which showed no evidence of ischemia and she does have an old inferior wall myocardial infarction scar.   Current Outpatient Prescriptions  Medication Sig Dispense Refill  . alendronate (FOSAMAX) 70 MG tablet Take 70 mg by mouth every 7 (seven) days. Take with a full glass of water on an empty stomach. Sundays       . allopurinol (ZYLOPRIM) 300 MG tablet Take 300 mg by mouth daily.      [name] amitriptyline (ELAVIL) 25 MG tablet Take 50 mg by mouth at bedtime.      [name] amLODipine (NORVASC) 5 MG tablet Take 1 tablet (5 mg total) by mouth daily.  30 tablet  10  . celecoxib (CELEBREX) 200 MG capsule Take 200 mg by mouth 2 (two) times daily.        . citalopram (CELEXA) 20 MG tablet Take 1 tablet (20 mg total) by mouth 2 (two) times daily.  60 tablet    . clopidogrel (PLAVIX) 75 MG tablet Take 1 tablet (75 mg total) by mouth daily.  30 tablet  11  . COUMADIN 5 MG tablet Take as directed by anticoagulation clinic  120 tablet  1  . cyclobenzaprine (FLEXERIL) 10 MG tablet Take 10 mg by mouth 3 (three) times daily as needed. For muscle spasms      . digoxin (LANOXIN) 0.125 MG tablet TAKE 1 TABLET EVERY DAY  90 tablet  3  . donepezil (ARICEPT) 5 MG tablet Take 1 tablet (5 mg total) by mouth daily.  90 tablet  1  . famotidine (PEPCID) 20 MG tablet Take 20 mg by mouth as needed.       . furosemide (LASIX) 40 MG tablet Take 160 mg by mouth daily.       [name] levothyroxine (SYNTHROID, LEVOTHROID) 25 MCG tablet Take 25 mcg by mouth daily.      [name]  metoprolol succinate (TOPROL-XL) 25 MG 24 hr tablet Take 25 mg by mouth once.      . potassium chloride SA (K-DUR,KLOR-CON) 20 MEQ tablet Take 1 tablet (20 mEq total) by mouth 2 (two) times daily.  180 tablet  3  . PROAIR HFA 108 (90 BASE) MCG/ACT inhaler TAKE 2 INHALATIONS INTO LUNGS EVERY SIX HOURS AS NEEDED FOR WHEEZING  8.5 each  0  . valsartan (DIOVAN) 40 MG tablet Take 1 tablet (40 mg total) by mouth daily.  90 tablet  3  . albuterol (PROVENTIL HFA;VENTOLIN HFA) 108 (90 BASE) MCG/ACT inhaler Inhale 2 puffs into the lungs every 6 (six) hours as needed. For shortness of breath      . nitroGLYCERIN (NITROSTAT) 0.4 MG SL tablet Place 1 tablet (0.4 mg total) under the tongue every 5 (five) minutes as needed for chest pain.  25 tablet  3   No current facility-administered medications  for this visit.    Allergies  Allergen Reactions  . Ceclor (Cefaclor) Other (See Comments)    Reaction unknown  . Lipitor (Atorvastatin Calcium) Other (See Comments)    Increased fibromyalgia pain  . Mevacor (Lovastatin) Other (See Comments)    Increased fibromyalgia pain  . Pravachol Other (See Comments)    Increased fibromyalgia pain  . Vasotec Other (See Comments)    Reaction unknown  . Adhesive (Tape) Itching and Rash    Burning   . Latex Itching and Rash    Burning     Patient Active Problem List   Diagnosis Date Noted  . Diastolic heart failure secondary to hypertension 06/27/2010    Priority: High  . Benign hypertensive heart disease without heart failure 05/29/2010    Priority: High  . Atrial fibrillation 05/06/2010    Priority: High  . Fibromyalgia 05/10/2010    Priority: Medium  . Anemia 05/10/2010    Priority: Medium  . Dyspnea 08/06/2012  . Vertebral artery stenosis 12/18/2011  . COPD (chronic obstructive pulmonary disease) 06/04/2010  . Heart failure, diastolic, due to HTN 05/10/2010  . Dyslipidemia 05/10/2010  . Insomnia due to mental condition 05/10/2010  . Depression 05/10/2010  . Cervical spinal stenosis 05/10/2010  . Hypothyroidism 05/10/2010  . Osteoarthritis 05/10/2010  . Osteoporosis 05/10/2010  . Ischemic heart disease 05/10/2010  . S/P CABG (coronary artery bypass graft) 05/10/2010  . Unspecified cerebral artery occlusion with cerebral infarction 05/06/2010    History  Smoking status  . Former Smoker -- 1.50 packs/day for 25 years  . Types: Cigarettes  . Quit date: 02/10/1977  Smokeless tobacco  . Never Used    History  Alcohol Use No    No family history on file.  Review of Systems: Constitutional: no fever chills diaphoresis or fatigue or change in weight.  Head and neck: no hearing loss, no epistaxis, no photophobia or visual disturbance. Respiratory: No cough, shortness of breath or wheezing. Cardiovascular: No chest pain  peripheral edema, palpitations. Gastrointestinal: No abdominal distention, no abdominal pain, no change in bowel habits hematochezia or melena. Genitourinary: No dysuria, no frequency, no urgency, no nocturia. Musculoskeletal:No arthralgias, no back pain, no gait disturbance or myalgias. Neurological: No dizziness, no headaches, no numbness, no seizures, no syncope, no weakness, no tremors. Hematologic: No lymphadenopathy, no easy bruising. Psychiatric: No confusion, no hallucinations, no sleep disturbance.    Physical Exam: Filed Vitals:   09/13/12 1447  BP: 134/76  Pulse: 64   the general appearance reveals a well-developed well-nourished woman in no distress.  She wears  a 4 pound metal brace on her left lower extremity.The head and neck exam reveals pupils equal and reactive.  Extraocular movements are full.  There is no scleral icterus.  The mouth and pharynx are normal.  The neck is supple.  The carotids reveal no bruits.  The jugular venous pressure is normal.  The  thyroid is not enlarged.  There is no lymphadenopathy.  The chest is clear to percussion and auscultation.  There are no rales or rhonchi.  Expansion of the chest is symmetrical.  The precordium is quiet.  The first heart sound is normal.  The second heart sound is physiologically split.  There is no murmur gallop rub or click.  There is no abnormal lift or heave.  The abdomen is soft and nontender.  The bowel sounds are normal.  The liver and spleen are not enlarged.  There are no abdominal masses.  There are no abdominal bruits.  Extremities reveal good pedal pulses.  There is no phlebitis or edema.  There is no cyanosis or clubbing.  Strength is normal and symmetrical in all extremities.  There is no lateralizing weakness.  There are no sensory deficits.  The skin is warm and dry.  There is no rash.     Assessment / Plan: Overall the patient appears to be stable.  We will give her a trial of sublingual nitroglycerin for her  left-sided chest discomfort.  Recheck here in 3 months for followup office visit and EKG.  Continue same medication.

## 2012-09-13 NOTE — Assessment & Plan Note (Signed)
The patient does have known ischemic heart disease with an old inferior wall MI.  Recent nuclear stress test showed no reversible ischemia.  She has been experiencing some post exercise discomfort in her left chest and radiating up into her left neck.  She does not have any sublingual nitroglycerin on hand.  We gave her a new bottle of nitroglycerin with instructions on how to try it if chest pain recurs.

## 2012-09-13 NOTE — Patient Instructions (Addendum)
Your physician recommends that you schedule a follow-up appointment in: 3 MONTHS W/ EKG  Your physician has recommended you make the following change in your medication:   One tablet under the tongue for chest pain, may take another in 5 minutes if chest pain continues, may take up to 3 times. If pain continues call 911, this med may cause headache and or low blood pressure.

## 2012-09-13 NOTE — Assessment & Plan Note (Signed)
The patient is in chronic atrial fibrillation.  She has had no new TIA symptoms.  She remains on long-term Coumadin.

## 2012-09-15 ENCOUNTER — Other Ambulatory Visit: Payer: Self-pay

## 2012-09-17 ENCOUNTER — Other Ambulatory Visit: Payer: Self-pay | Admitting: Neurology

## 2012-09-22 ENCOUNTER — Other Ambulatory Visit: Payer: Self-pay | Admitting: Cardiology

## 2012-09-22 ENCOUNTER — Other Ambulatory Visit: Payer: Self-pay

## 2012-09-23 ENCOUNTER — Ambulatory Visit (INDEPENDENT_AMBULATORY_CARE_PROVIDER_SITE_OTHER): Payer: 59 | Admitting: Pharmacist

## 2012-09-23 DIAGNOSIS — I6350 Cerebral infarction due to unspecified occlusion or stenosis of unspecified cerebral artery: Secondary | ICD-10-CM

## 2012-09-23 DIAGNOSIS — I4891 Unspecified atrial fibrillation: Secondary | ICD-10-CM

## 2012-09-24 ENCOUNTER — Other Ambulatory Visit: Payer: Self-pay | Admitting: *Deleted

## 2012-09-24 MED ORDER — VALSARTAN 40 MG PO TABS: 40.0000 mg | ORAL_TABLET | Freq: Every day | ORAL | Status: DC

## 2012-10-08 ENCOUNTER — Ambulatory Visit: Payer: Medicare Other | Admitting: Pulmonary Disease

## 2012-10-13 ENCOUNTER — Ambulatory Visit (INDEPENDENT_AMBULATORY_CARE_PROVIDER_SITE_OTHER): Payer: Medicare Other | Admitting: *Deleted

## 2012-10-13 DIAGNOSIS — I6350 Cerebral infarction due to unspecified occlusion or stenosis of unspecified cerebral artery: Secondary | ICD-10-CM

## 2012-10-13 DIAGNOSIS — I4891 Unspecified atrial fibrillation: Secondary | ICD-10-CM

## 2012-10-13 LAB — POCT INR: INR: 2.8

## 2012-10-27 ENCOUNTER — Ambulatory Visit (INDEPENDENT_AMBULATORY_CARE_PROVIDER_SITE_OTHER): Payer: Medicare Other | Admitting: Pharmacist

## 2012-10-27 DIAGNOSIS — I4891 Unspecified atrial fibrillation: Secondary | ICD-10-CM

## 2012-10-27 DIAGNOSIS — I6350 Cerebral infarction due to unspecified occlusion or stenosis of unspecified cerebral artery: Secondary | ICD-10-CM

## 2012-10-29 ENCOUNTER — Ambulatory Visit
Admission: RE | Admit: 2012-10-29 | Discharge: 2012-10-29 | Disposition: A | Discharge status: Home / Self Care | Payer: 59 | Source: Ambulatory Visit | Attending: Family Medicine | Admitting: Family Medicine

## 2012-10-29 ENCOUNTER — Other Ambulatory Visit: Payer: Self-pay | Admitting: Family Medicine

## 2012-10-29 DIAGNOSIS — R109 Unspecified abdominal pain: Secondary | ICD-10-CM

## 2012-10-29 MED ORDER — IOHEXOL 300 MG/ML  SOLN
100.0000 mL | Freq: Once | INTRAMUSCULAR | Status: AC | PRN
  Administered 2012-10-29: 100 mL via INTRAVENOUS

## 2012-10-29 MED ORDER — IOHEXOL 300 MG/ML  SOLN
30.0000 mL | Freq: Once | INTRAMUSCULAR | Status: AC | PRN
  Administered 2012-10-29: 30 mL via ORAL

## 2012-10-29 NOTE — Progress Notes (Signed)
Blood drawn for labs from right [facility]. site is unremarkable and pt tolerated blood draw well.

## 2012-11-02 ENCOUNTER — Other Ambulatory Visit (HOSPITAL_COMMUNITY): Payer: Self-pay | Admitting: Family Medicine

## 2012-11-02 DIAGNOSIS — R195 Other fecal abnormalities: Secondary | ICD-10-CM

## 2012-11-02 DIAGNOSIS — R110 Nausea: Secondary | ICD-10-CM

## 2012-11-02 DIAGNOSIS — R109 Unspecified abdominal pain: Secondary | ICD-10-CM

## 2012-11-03 ENCOUNTER — Other Ambulatory Visit (HOSPITAL_COMMUNITY): Payer: 59

## 2012-11-03 ENCOUNTER — Ambulatory Visit (HOSPITAL_COMMUNITY)
Admission: RE | Admit: 2012-11-03 | Discharge: 2012-11-03 | Disposition: A | Discharge status: Home / Self Care | Payer: Medicare Other | Source: Ambulatory Visit | Attending: Family Medicine | Admitting: Family Medicine

## 2012-11-03 DIAGNOSIS — K7689 Other specified diseases of liver: Secondary | ICD-10-CM | POA: Insufficient documentation

## 2012-11-03 DIAGNOSIS — R195 Other fecal abnormalities: Secondary | ICD-10-CM

## 2012-11-03 DIAGNOSIS — R109 Unspecified abdominal pain: Secondary | ICD-10-CM | POA: Insufficient documentation

## 2012-11-03 DIAGNOSIS — R110 Nausea: Secondary | ICD-10-CM | POA: Insufficient documentation

## 2012-11-10 ENCOUNTER — Telehealth: Payer: Self-pay | Admitting: Cardiology

## 2012-11-10 DIAGNOSIS — Z79899 Other long term (current) drug therapy: Secondary | ICD-10-CM

## 2012-11-10 NOTE — Telephone Encounter (Signed)
Advised patient and scheduled lab for next week when she comes for INR

## 2012-11-10 NOTE — Telephone Encounter (Signed)
Larey Seat about a month ago and back started hurting. Saw Dr Ethelene Hal and Rx'd Medrol dosepak, finished today.  Weight is down 20 pounds. Having some issues with eating and when she eats starts having diarrhea until system empties. Daughter concerned since her weight down to 150 she might need to decrease her Lanoxin. Patient would  Dr. Patty Sermons to review medication. Will forward to him

## 2012-11-10 NOTE — Telephone Encounter (Signed)
New Problem  Pt states she has been sick lost 20 lbs and has diarrhea// wants to know if any of her medications is causing it. Please advise

## 2012-11-10 NOTE — Telephone Encounter (Signed)
Reduce digoxin down to 0.125 mg every other day.  Check a digoxin level when she returns for her next office visit.

## 2012-11-11 ENCOUNTER — Other Ambulatory Visit: Payer: Self-pay | Admitting: Radiology

## 2012-11-11 DIAGNOSIS — I6529 Occlusion and stenosis of unspecified carotid artery: Secondary | ICD-10-CM

## 2012-11-16 ENCOUNTER — Telehealth: Payer: Self-pay

## 2012-11-16 NOTE — Telephone Encounter (Signed)
Pt called stating she saw Dr Ethelene Hal for back pain s/p fall.  He wants to do an epidural injection, but wants pt off Coumadin x 5 days prior.  Please advise if ok to hold Coumadin for upcoming procedure. Epidural procedure has not been scheduled as of yet, awaiting clearance.  Please advise.  Thanks

## 2012-11-16 NOTE — Telephone Encounter (Signed)
She is at high risk for stroke if/when she is off warfarin. She would need to be bridged by coumadin clinic.

## 2012-11-17 ENCOUNTER — Ambulatory Visit (INDEPENDENT_AMBULATORY_CARE_PROVIDER_SITE_OTHER): Payer: Medicare Other | Admitting: Pharmacist

## 2012-11-17 ENCOUNTER — Telehealth: Payer: Self-pay | Admitting: *Deleted

## 2012-11-17 ENCOUNTER — Ambulatory Visit (INDEPENDENT_AMBULATORY_CARE_PROVIDER_SITE_OTHER): Payer: Medicare Other | Admitting: *Deleted

## 2012-11-17 DIAGNOSIS — I6350 Cerebral infarction due to unspecified occlusion or stenosis of unspecified cerebral artery: Secondary | ICD-10-CM

## 2012-11-17 DIAGNOSIS — I4891 Unspecified atrial fibrillation: Secondary | ICD-10-CM

## 2012-11-17 DIAGNOSIS — Z79899 Other long term (current) drug therapy: Secondary | ICD-10-CM

## 2012-11-17 LAB — POCT INR: INR: 2.6

## 2012-11-17 MED ORDER — ENOXAPARIN SODIUM 100 MG/ML ~~LOC~~ SOLN: 100.0000 mg | Freq: Every day | SUBCUTANEOUS | Status: DC

## 2012-11-17 NOTE — Telephone Encounter (Signed)
Yes okay to hold the Plavix prior to her epidural injection

## 2012-11-17 NOTE — Telephone Encounter (Signed)
11/18/12- Take last dose of coumadin.  11/19/12- Do Nothing 11/20/12-Start Lovenox 100mg  injection subcutaneously into abdomen once daily at 7am.  11/21/12- Continue Lovenox 100mg  injection subcutaneously into abdomen once daily at 7am.  11/22/12- Continue Lovenox 100mg  injection subcutaneously into abdomen once daily at 7am.  11/23/12- Continue Lovenox 100mg  injection subcutaneously into abdomen once daily at 7am.  11/24/12-  Day Of Procedure, INR this day at 8am at [facility]     Restart post procedure Coumadin with 1/2 tablet extra with normal dose for 2 days then normal dose and Lovenox 100mg s every 24 hrs once         daily. Until follow up appt on 12/30/12 at 2:15pm.

## 2012-11-17 NOTE — Telephone Encounter (Signed)
Will forward to Coumadin clinic  

## 2012-11-17 NOTE — Telephone Encounter (Signed)
Pt telephoned to give [place] date for upcoming Epidural injection on 11/24/12. She is aware that she will be bridged with Lovenox. Pt states Dr Ethelene Hal wants her to hold her Plavix also, is this ok? Please Advise

## 2012-11-18 ENCOUNTER — Telehealth: Payer: Self-pay | Admitting: *Deleted

## 2012-11-18 LAB — DIGOXIN LEVEL: Digoxin Level: 0.4 ng/mL — ABNORMAL LOW (ref 0.8–2.0)

## 2012-11-18 NOTE — Telephone Encounter (Signed)
Advised patient

## 2012-11-18 NOTE — Telephone Encounter (Signed)
Message copied by Burnell Blanks on Thu Nov 18, 2012 10:02 AM ------      Message from: Cassell Clement      Created: Thu Nov 18, 2012  7:52 AM       Dig level is satisfactory for her.  No toxicity. CSD ------

## 2012-11-18 NOTE — Telephone Encounter (Signed)
Advised patient of lab results  

## 2012-11-23 ENCOUNTER — Ambulatory Visit: Payer: Medicare Other | Admitting: Pulmonary Disease

## 2012-11-24 ENCOUNTER — Ambulatory Visit (INDEPENDENT_AMBULATORY_CARE_PROVIDER_SITE_OTHER): Payer: Medicare Other | Admitting: *Deleted

## 2012-11-24 ENCOUNTER — Ambulatory Visit: Payer: 59

## 2012-11-24 DIAGNOSIS — I6350 Cerebral infarction due to unspecified occlusion or stenosis of unspecified cerebral artery: Secondary | ICD-10-CM

## 2012-11-24 DIAGNOSIS — I4891 Unspecified atrial fibrillation: Secondary | ICD-10-CM

## 2012-11-27 ENCOUNTER — Other Ambulatory Visit: Payer: Self-pay | Admitting: Cardiology

## 2012-11-29 ENCOUNTER — Ambulatory Visit (INDEPENDENT_AMBULATORY_CARE_PROVIDER_SITE_OTHER): Payer: Medicare Other | Admitting: General Practice

## 2012-11-29 DIAGNOSIS — I4891 Unspecified atrial fibrillation: Secondary | ICD-10-CM

## 2012-11-29 DIAGNOSIS — I6350 Cerebral infarction due to unspecified occlusion or stenosis of unspecified cerebral artery: Secondary | ICD-10-CM

## 2012-12-02 ENCOUNTER — Ambulatory Visit (INDEPENDENT_AMBULATORY_CARE_PROVIDER_SITE_OTHER): Payer: Medicare Other | Admitting: General Practice

## 2012-12-02 DIAGNOSIS — I6350 Cerebral infarction due to unspecified occlusion or stenosis of unspecified cerebral artery: Secondary | ICD-10-CM

## 2012-12-02 DIAGNOSIS — I4891 Unspecified atrial fibrillation: Secondary | ICD-10-CM

## 2012-12-10 ENCOUNTER — Ambulatory Visit (HOSPITAL_COMMUNITY)
Admission: RE | Admit: 2012-12-10 | Discharge: 2012-12-10 | Disposition: A | Discharge status: Home / Self Care | Payer: Medicare Other | Source: Ambulatory Visit | Attending: Interventional Radiology | Admitting: Interventional Radiology

## 2012-12-10 DIAGNOSIS — I6529 Occlusion and stenosis of unspecified carotid artery: Secondary | ICD-10-CM | POA: Insufficient documentation

## 2012-12-10 DIAGNOSIS — I658 Occlusion and stenosis of other precerebral arteries: Secondary | ICD-10-CM | POA: Insufficient documentation

## 2012-12-10 NOTE — Progress Notes (Signed)
Bilateral carotid artery duplex:  1-39% ICA stenosis.  Right:  Vertebral artery is antegrade.  It demonstrates low velocities and abnormal Doppler waveforms that become absent more distal.  Left:  Vertebral artery flow is antegrade.

## 2012-12-14 ENCOUNTER — Ambulatory Visit (INDEPENDENT_AMBULATORY_CARE_PROVIDER_SITE_OTHER): Payer: Medicare Other | Admitting: Cardiology

## 2012-12-14 ENCOUNTER — Encounter: Payer: Self-pay | Admitting: Cardiology

## 2012-12-14 ENCOUNTER — Ambulatory Visit (INDEPENDENT_AMBULATORY_CARE_PROVIDER_SITE_OTHER): Payer: Medicare Other | Admitting: *Deleted

## 2012-12-14 VITALS — BP 130/80 | HR 74 | Ht 64.0 in | Wt 167.0 lb

## 2012-12-14 DIAGNOSIS — I110 Hypertensive heart disease with heart failure: Secondary | ICD-10-CM

## 2012-12-14 DIAGNOSIS — E785 Hyperlipidemia, unspecified: Secondary | ICD-10-CM

## 2012-12-14 DIAGNOSIS — I4891 Unspecified atrial fibrillation: Secondary | ICD-10-CM

## 2012-12-14 DIAGNOSIS — I119 Hypertensive heart disease without heart failure: Secondary | ICD-10-CM

## 2012-12-14 DIAGNOSIS — Z79899 Other long term (current) drug therapy: Secondary | ICD-10-CM

## 2012-12-14 DIAGNOSIS — I6350 Cerebral infarction due to unspecified occlusion or stenosis of unspecified cerebral artery: Secondary | ICD-10-CM

## 2012-12-14 DIAGNOSIS — I5030 Unspecified diastolic (congestive) heart failure: Secondary | ICD-10-CM

## 2012-12-14 DIAGNOSIS — F329 Major depressive disorder, single episode, unspecified: Secondary | ICD-10-CM

## 2012-12-14 DIAGNOSIS — I259 Chronic ischemic heart disease, unspecified: Secondary | ICD-10-CM

## 2012-12-14 MED ORDER — CITALOPRAM HYDROBROMIDE 20 MG PO TABS: 20.0000 mg | ORAL_TABLET | Freq: Two times a day (BID) | ORAL | Status: DC

## 2012-12-14 NOTE — Assessment & Plan Note (Signed)
Blood pressure was remaining satisfactory on alternate day amlodipine.  Her peripheral edema has resolved since we reduced her amlodipine.

## 2012-12-14 NOTE — Assessment & Plan Note (Signed)
The patient's depression seems improved on her current dose of Celexa 20 mg twice a day

## 2012-12-14 NOTE — Progress Notes (Signed)
Amber [name] Date of Birth:  1934/07/01 [address] [place], [place]  [address] [phone]         Fax   [phone]  History of Present Illness: This pleasant 78 year old woman is seen for a scheduled followup office visit. She has a complex past medical history. She has known ischemic heart disease with coronary artery bypass graft surgery in 2000. She has had remote stroke with residual left hemiparesis. She's had a long history of high blood pressure and high cholesterol. She has established atrial fibrillation and is on Coumadin. She has chronically elevated CK levels and she has a history of fibromyalgia.  She is followed for this by Dr. Lendon Colonel, rheumatologist with [facility].  She does not tolerate statins. She's had congestive heart failure secondary to diastolic dysfunction.  She was cardioverted in June 2012 but remained in sinus rhythm for less than one week. She is now in established chronic atrial fibrillation on Coumadin.  The patient has a history of COPD. She recently saw Dr. Craige Cotta who felt that most of her dyspnea was probably related to worsening congestive heart failure rather than her lung disease. A chest x-ray on 08/06/12 showed x-ray findings of mild CHF. The patient has improved since increasing her furosemide to a total daily dose of 160 mg. previously we had switched her from Diovan to losartan because of cost .  However, she did not do as well and we switched her back to Diovan and she feels better .  The patient still has very little stamina when she walks. Since last visit the patient had an echocardiogram on 08/12/12 showing an ejection fraction of 60-65%.  She also had a Lexa scan Myoview on 08/25/12 which showed no evidence of ischemia and she does have an old inferior wall myocardial infarction scar.   Current Outpatient Prescriptions  Medication Sig Dispense Refill  . albuterol (PROVENTIL HFA;VENTOLIN HFA) 108 (90 BASE) MCG/ACT  inhaler Inhale 2 puffs into the lungs every 6 (six) hours as needed. For shortness of breath      . alendronate (FOSAMAX) 70 MG tablet Take 70 mg by mouth every 7 (seven) days. Take with a full glass of water on an empty stomach. Sundays      . allopurinol (ZYLOPRIM) 300 MG tablet TAKE 1 TABLET EVERY DAY  90 tablet  0  . amitriptyline (ELAVIL) 25 MG tablet Take 50 mg by mouth at bedtime.      [name] amLODipine (NORVASC) 5 MG tablet Take 5 mg by mouth every other day.      . celecoxib (CELEBREX) 200 MG capsule Take 200 mg by mouth 2 (two) times daily.        . citalopram (CELEXA) 20 MG tablet Take 1 tablet (20 mg total) by mouth 2 (two) times daily.  60 tablet  5  . clopidogrel (PLAVIX) 75 MG tablet Take 1 tablet (75 mg total) by mouth daily.  30 tablet  11  . COUMADIN 5 MG tablet Take as directed by anticoagulation clinic  120 tablet  1  . cyclobenzaprine (FLEXERIL) 10 MG tablet Take 10 mg by mouth 3 (three) times daily as needed. For muscle spasms      . digoxin (LANOXIN) 0.125 MG tablet Take 0.125 mg by mouth every other day.       . donepezil (ARICEPT) 5 MG tablet Take 1 tablet (5 mg total) by mouth daily.  90 tablet  1  . enoxaparin (LOVENOX) 100 MG/ML  injection Inject 1 mL (100 mg total) into the skin daily.  10 Syringe  1  . famotidine (PEPCID) 20 MG tablet Take 20 mg by mouth as needed.       . furosemide (LASIX) 40 MG tablet Take 80 mg by mouth daily.       [name] levothyroxine (SYNTHROID, LEVOTHROID) 25 MCG tablet Take 25 mcg by mouth daily.      . metoprolol succinate (TOPROL-XL) 25 MG 24 hr tablet Take 25 mg by mouth once.      . nitroGLYCERIN (NITROSTAT) 0.4 MG SL tablet Place 1 tablet (0.4 mg total) under the tongue every 5 (five) minutes as needed for chest pain.  25 tablet  3  . potassium chloride SA (K-DUR,KLOR-CON) 20 MEQ tablet Take 1 tablet (20 mEq total) by mouth 2 (two) times daily.  180 tablet  3  . PROAIR HFA 108 (90 BASE) MCG/ACT inhaler TAKE 2 INHALATIONS INTO LUNGS EVERY SIX HOURS  AS NEEDED FOR WHEEZING  8.5 each  0  . valsartan (DIOVAN) 40 MG tablet Take 1 tablet (40 mg total) by mouth daily.  90 tablet  3   No current facility-administered medications for this visit.    Allergies  Allergen Reactions  . Ceclor [Cefaclor] Other (See Comments)    Reaction unknown  . Lipitor [Atorvastatin Calcium] Other (See Comments)    Increased fibromyalgia pain  . Mevacor [Lovastatin] Other (See Comments)    Increased fibromyalgia pain  . Pravachol Other (See Comments)    Increased fibromyalgia pain  . Vasotec Other (See Comments)    Reaction unknown  . Adhesive [Tape] Itching and Rash    Burning   . Latex Itching and Rash    Burning     Patient Active Problem List   Diagnosis Date Noted  . Diastolic heart failure secondary to hypertension 06/27/2010    Priority: High  . Benign hypertensive heart disease without heart failure 05/29/2010    Priority: High  . Atrial fibrillation 05/06/2010    Priority: High  . Fibromyalgia 05/10/2010    Priority: Medium  . Anemia 05/10/2010    Priority: Medium  . Dyspnea 08/06/2012  . Vertebral artery stenosis 12/18/2011  . COPD (chronic obstructive pulmonary disease) 06/04/2010  . Heart failure, diastolic, due to HTN 05/10/2010  . Dyslipidemia 05/10/2010  . Insomnia due to mental condition 05/10/2010  . Depression 05/10/2010  . Cervical spinal stenosis 05/10/2010  . Hypothyroidism 05/10/2010  . Osteoarthritis 05/10/2010  . Osteoporosis 05/10/2010  . Ischemic heart disease 05/10/2010  . S/P CABG (coronary artery bypass graft) 05/10/2010  . Unspecified cerebral artery occlusion with cerebral infarction 05/06/2010    History  Smoking status  . Former Smoker -- 1.50 packs/day for 25 years  . Types: Cigarettes  . Quit date: 02/10/1977  Smokeless tobacco  . Never Used    History  Alcohol Use No    No family history on file.  Review of Systems: Constitutional: no fever chills diaphoresis or fatigue or change in  weight.  Head and neck: no hearing loss, no epistaxis, no photophobia or visual disturbance. Respiratory: No cough, shortness of breath or wheezing. Cardiovascular: No chest pain peripheral edema, palpitations. Gastrointestinal: No abdominal distention, no abdominal pain, no change in bowel habits hematochezia or melena. Genitourinary: No dysuria, no frequency, no urgency, no nocturia. Musculoskeletal:No arthralgias, no back pain, no gait disturbance or myalgias. Neurological: No dizziness, no headaches, no numbness, no seizures, no syncope, no weakness, no tremors. Hematologic: No lymphadenopathy, no easy  bruising. Psychiatric: No confusion, no hallucinations, no sleep disturbance.    Physical Exam: Filed Vitals:   12/14/12 1414  BP: 130/80  Pulse: 74   the general appearance reveals a well-developed well-nourished woman in no distress.  She wears a 4 pound metal brace on her left lower extremity.The head and neck exam reveals pupils equal and reactive.  Extraocular movements are full.  There is no scleral icterus.  The mouth and pharynx are normal.  The neck is supple.  The carotids reveal no bruits.  The jugular venous pressure is normal.  The  thyroid is not enlarged.  There is no lymphadenopathy.  The chest is clear to percussion and auscultation.  There are no rales or rhonchi.  Expansion of the chest is symmetrical.  The precordium is quiet.  The first heart sound is normal.  The second heart sound is physiologically split.  There is no murmur gallop rub or click.  There is no abnormal lift or heave.  The abdomen is soft and nontender.  The bowel sounds are normal.  The liver and spleen are not enlarged.  There are no abdominal masses.  There are no abdominal bruits.  Extremities reveal good pedal pulses.  There is no phlebitis or edema.  There is no cyanosis or clubbing.  Strength is normal and symmetrical in all extremities.  There is no lateralizing weakness.  There are no sensory  deficits.  The skin is warm and dry.  There is no rash.     Assessment / Plan: Overall the patient appears to be stable.    Recheck here in 3 months for followup office visit , EKG, fasting lipid panel hepatic function panel nasal metabolic panel CBC TSH and free T4.[name]

## 2012-12-14 NOTE — Patient Instructions (Signed)
INCREASE YOUR CELEXA 20 MG TO TWICE A DAY  Your physician recommends that you schedule a follow-up appointment in: 3 months with fasting labs (LP/BMET/HFP/CBC/TSH/FT4)

## 2012-12-14 NOTE — Assessment & Plan Note (Signed)
The patient remains on low-dose Lanoxin every other day with adequate rate control.  No TIA or stroke symptoms.  She is on long-term Coumadin.

## 2012-12-14 NOTE — Assessment & Plan Note (Signed)
The patient has not been having any chest pain or angina 

## 2012-12-15 ENCOUNTER — Telehealth (HOSPITAL_COMMUNITY): Payer: Self-pay | Admitting: Interventional Radiology

## 2012-12-15 NOTE — Telephone Encounter (Signed)
Called pt told her she will have another US carotid duplex in 6 months time. She states understanding and is in agreement w/ this plan of care. JM

## 2012-12-16 ENCOUNTER — Other Ambulatory Visit: Payer: Self-pay

## 2012-12-28 ENCOUNTER — Other Ambulatory Visit: Payer: Self-pay | Admitting: Cardiology

## 2013-01-03 ENCOUNTER — Ambulatory Visit: Payer: Medicare Other | Admitting: Pulmonary Disease

## 2013-01-04 ENCOUNTER — Ambulatory Visit (INDEPENDENT_AMBULATORY_CARE_PROVIDER_SITE_OTHER): Payer: Medicare Other | Admitting: General Practice

## 2013-01-04 DIAGNOSIS — I4891 Unspecified atrial fibrillation: Secondary | ICD-10-CM

## 2013-01-04 DIAGNOSIS — I6350 Cerebral infarction due to unspecified occlusion or stenosis of unspecified cerebral artery: Secondary | ICD-10-CM

## 2013-01-19 ENCOUNTER — Other Ambulatory Visit: Payer: Self-pay | Admitting: Cardiology

## 2013-02-01 ENCOUNTER — Ambulatory Visit (INDEPENDENT_AMBULATORY_CARE_PROVIDER_SITE_OTHER): Payer: Medicare Other | Admitting: Pharmacist

## 2013-02-01 DIAGNOSIS — I6350 Cerebral infarction due to unspecified occlusion or stenosis of unspecified cerebral artery: Secondary | ICD-10-CM

## 2013-02-01 DIAGNOSIS — I4891 Unspecified atrial fibrillation: Secondary | ICD-10-CM

## 2013-02-01 LAB — POCT INR: INR: 2.4

## 2013-02-05 ENCOUNTER — Other Ambulatory Visit: Payer: Self-pay | Admitting: Cardiology

## 2013-02-08 ENCOUNTER — Telehealth: Payer: Self-pay | Admitting: Cardiology

## 2013-02-08 NOTE — Telephone Encounter (Signed)
New message     Pt c/o congestion in chest, spitting up green sputam and coughing.  Husband has been sick also.  Will Dr Patty Sermons call in a xpak to [facility]

## 2013-02-08 NOTE — Telephone Encounter (Signed)
Advised patient  Dr. Patty Sermons out of the office, needs to contact PCP

## 2013-02-12 ENCOUNTER — Inpatient Hospital Stay (HOSPITAL_COMMUNITY)
Admission: EM | Admit: 2013-02-12 | Discharge: 2013-02-19 | DRG: 193 | Disposition: A | Discharge status: Home Health | Payer: Medicare Other | Attending: Internal Medicine | Admitting: Internal Medicine

## 2013-02-12 ENCOUNTER — Emergency Department (HOSPITAL_COMMUNITY): Payer: Medicare Other

## 2013-02-12 ENCOUNTER — Encounter (HOSPITAL_COMMUNITY): Payer: Self-pay | Admitting: Emergency Medicine

## 2013-02-12 DIAGNOSIS — I6350 Cerebral infarction due to unspecified occlusion or stenosis of unspecified cerebral artery: Secondary | ICD-10-CM

## 2013-02-12 DIAGNOSIS — I214 Non-ST elevation (NSTEMI) myocardial infarction: Secondary | ICD-10-CM | POA: Diagnosis present

## 2013-02-12 DIAGNOSIS — F3289 Other specified depressive episodes: Secondary | ICD-10-CM | POA: Diagnosis present

## 2013-02-12 DIAGNOSIS — R404 Transient alteration of awareness: Secondary | ICD-10-CM | POA: Diagnosis present

## 2013-02-12 DIAGNOSIS — R748 Abnormal levels of other serum enzymes: Secondary | ICD-10-CM | POA: Diagnosis present

## 2013-02-12 DIAGNOSIS — E039 Hypothyroidism, unspecified: Secondary | ICD-10-CM | POA: Diagnosis present

## 2013-02-12 DIAGNOSIS — F329 Major depressive disorder, single episode, unspecified: Secondary | ICD-10-CM | POA: Diagnosis present

## 2013-02-12 DIAGNOSIS — M109 Gout, unspecified: Secondary | ICD-10-CM | POA: Diagnosis present

## 2013-02-12 DIAGNOSIS — J154 Pneumonia due to other streptococci: Secondary | ICD-10-CM | POA: Diagnosis present

## 2013-02-12 DIAGNOSIS — J9620 Acute and chronic respiratory failure, unspecified whether with hypoxia or hypercapnia: Secondary | ICD-10-CM | POA: Diagnosis present

## 2013-02-12 DIAGNOSIS — J13 Pneumonia due to Streptococcus pneumoniae: Principal | ICD-10-CM | POA: Diagnosis present

## 2013-02-12 DIAGNOSIS — M1990 Unspecified osteoarthritis, unspecified site: Secondary | ICD-10-CM

## 2013-02-12 DIAGNOSIS — F5105 Insomnia due to other mental disorder: Secondary | ICD-10-CM

## 2013-02-12 DIAGNOSIS — R778 Other specified abnormalities of plasma proteins: Secondary | ICD-10-CM

## 2013-02-12 DIAGNOSIS — F05 Delirium due to known physiological condition: Secondary | ICD-10-CM

## 2013-02-12 DIAGNOSIS — Z7901 Long term (current) use of anticoagulants: Secondary | ICD-10-CM

## 2013-02-12 DIAGNOSIS — I2510 Atherosclerotic heart disease of native coronary artery without angina pectoris: Secondary | ICD-10-CM | POA: Diagnosis present

## 2013-02-12 DIAGNOSIS — R7989 Other specified abnormal findings of blood chemistry: Secondary | ICD-10-CM

## 2013-02-12 DIAGNOSIS — J449 Chronic obstructive pulmonary disease, unspecified: Secondary | ICD-10-CM | POA: Diagnosis present

## 2013-02-12 DIAGNOSIS — I110 Hypertensive heart disease with heart failure: Secondary | ICD-10-CM | POA: Diagnosis present

## 2013-02-12 DIAGNOSIS — IMO0002 Reserved for concepts with insufficient information to code with codable children: Secondary | ICD-10-CM

## 2013-02-12 DIAGNOSIS — I69959 Hemiplegia and hemiparesis following unspecified cerebrovascular disease affecting unspecified side: Secondary | ICD-10-CM

## 2013-02-12 DIAGNOSIS — IMO0001 Reserved for inherently not codable concepts without codable children: Secondary | ICD-10-CM | POA: Diagnosis present

## 2013-02-12 DIAGNOSIS — M4802 Spinal stenosis, cervical region: Secondary | ICD-10-CM

## 2013-02-12 DIAGNOSIS — Z951 Presence of aortocoronary bypass graft: Secondary | ICD-10-CM

## 2013-02-12 DIAGNOSIS — J189 Pneumonia, unspecified organism: Secondary | ICD-10-CM

## 2013-02-12 DIAGNOSIS — I509 Heart failure, unspecified: Secondary | ICD-10-CM

## 2013-02-12 DIAGNOSIS — T380X5A Adverse effect of glucocorticoids and synthetic analogues, initial encounter: Secondary | ICD-10-CM | POA: Diagnosis present

## 2013-02-12 DIAGNOSIS — I5030 Unspecified diastolic (congestive) heart failure: Secondary | ICD-10-CM

## 2013-02-12 DIAGNOSIS — J441 Chronic obstructive pulmonary disease with (acute) exacerbation: Secondary | ICD-10-CM | POA: Diagnosis present

## 2013-02-12 DIAGNOSIS — M797 Fibromyalgia: Secondary | ICD-10-CM

## 2013-02-12 DIAGNOSIS — I5032 Chronic diastolic (congestive) heart failure: Secondary | ICD-10-CM

## 2013-02-12 DIAGNOSIS — F0390 Unspecified dementia without behavioral disturbance: Secondary | ICD-10-CM | POA: Diagnosis present

## 2013-02-12 DIAGNOSIS — F32A Depression, unspecified: Secondary | ICD-10-CM

## 2013-02-12 DIAGNOSIS — I119 Hypertensive heart disease without heart failure: Secondary | ICD-10-CM

## 2013-02-12 DIAGNOSIS — I5033 Acute on chronic diastolic (congestive) heart failure: Secondary | ICD-10-CM | POA: Diagnosis present

## 2013-02-12 DIAGNOSIS — I4891 Unspecified atrial fibrillation: Secondary | ICD-10-CM | POA: Diagnosis present

## 2013-02-12 DIAGNOSIS — E785 Hyperlipidemia, unspecified: Secondary | ICD-10-CM | POA: Diagnosis present

## 2013-02-12 DIAGNOSIS — K219 Gastro-esophageal reflux disease without esophagitis: Secondary | ICD-10-CM | POA: Diagnosis present

## 2013-02-12 DIAGNOSIS — D649 Anemia, unspecified: Secondary | ICD-10-CM

## 2013-02-12 DIAGNOSIS — F1999 Other psychoactive substance use, unspecified with unspecified psychoactive substance-induced disorder: Secondary | ICD-10-CM | POA: Diagnosis present

## 2013-02-12 HISTORY — DX: Personal history of malignant melanoma of skin: Z85.820

## 2013-02-12 LAB — CBC WITH DIFFERENTIAL/PLATELET
BASOS ABS: 0 10*3/uL (ref 0.0–0.1)
Basophils Relative: 0 % (ref 0–1)
EOS PCT: 0 % (ref 0–5)
Eosinophils Absolute: 0.1 10*3/uL (ref 0.0–0.7)
HEMATOCRIT: 41.3 % (ref 36.0–46.0)
Hemoglobin: 13.8 g/dL (ref 12.0–15.0)
LYMPHS PCT: 6 % — AB (ref 12–46)
Lymphs Abs: 0.9 10*3/uL (ref 0.7–4.0)
MCH: 30.7 pg (ref 26.0–34.0)
MCHC: 33.4 g/dL (ref 30.0–36.0)
MCV: 92 fL (ref 78.0–100.0)
Monocytes Absolute: 0.9 10*3/uL (ref 0.1–1.0)
Monocytes Relative: 6 % (ref 3–12)
Neutro Abs: 11.6 10*3/uL — ABNORMAL HIGH (ref 1.7–7.7)
Neutrophils Relative %: 87 % — ABNORMAL HIGH (ref 43–77)
PLATELETS: 248 10*3/uL (ref 150–400)
RBC: 4.49 MIL/uL (ref 3.87–5.11)
RDW: 14.6 % (ref 11.5–15.5)
WBC: 13.4 10*3/uL — AB (ref 4.0–10.5)

## 2013-02-12 LAB — BASIC METABOLIC PANEL
BUN: 11 mg/dL (ref 6–23)
CALCIUM: 10 mg/dL (ref 8.4–10.5)
CO2: 28 meq/L (ref 19–32)
Chloride: 92 mEq/L — ABNORMAL LOW (ref 96–112)
Creatinine, Ser: 0.69 mg/dL (ref 0.50–1.10)
GFR calc Af Amer: >90 mL/min (ref >90)
GFR, EST NON AFRICAN AMERICAN: 82 mL/min — AB (ref >90)
Glucose, Bld: 139 mg/dL — ABNORMAL HIGH (ref 70–99)
Potassium: 3.6 mEq/L — ABNORMAL LOW (ref 3.7–5.3)
Sodium: 137 mEq/L (ref 137–147)

## 2013-02-12 LAB — PRO B NATRIURETIC PEPTIDE: PRO B NATRI PEPTIDE: 1485 pg/mL — AB (ref 0–450)

## 2013-02-12 LAB — PROTIME-INR
INR: 1.53 — ABNORMAL HIGH (ref 0.00–1.49)
PROTHROMBIN TIME: 18 s — AB (ref 11.6–15.2)

## 2013-02-12 LAB — POCT I-STAT TROPONIN I: Troponin i, poc: 0.02 ng/mL (ref 0.00–0.08)

## 2013-02-12 LAB — CG4 I-STAT (LACTIC ACID): LACTIC ACID, VENOUS: 1.38 mmol/L (ref 0.5–2.2)

## 2013-02-12 LAB — DIGOXIN LEVEL: Digoxin Level: 0.8 ng/mL (ref 0.8–2.0)

## 2013-02-12 MED ORDER — DEXTROSE 5 % IV SOLN
1.0000 g | Freq: Once | INTRAVENOUS | Status: AC
  Administered 2013-02-12: 1 g via INTRAVENOUS
  Filled 2013-02-12: qty 10

## 2013-02-12 MED ORDER — METHYLPREDNISOLONE SODIUM SUCC 125 MG IJ SOLR
125.0000 mg | Freq: Once | INTRAMUSCULAR | Status: AC
  Administered 2013-02-12: 125 mg via INTRAVENOUS
  Filled 2013-02-12: qty 2

## 2013-02-12 MED ORDER — DEXTROSE 5 % IV SOLN
500.0000 mg | Freq: Once | INTRAVENOUS | Status: DC
  Administered 2013-02-13: 500 mg via INTRAVENOUS

## 2013-02-12 MED ORDER — FUROSEMIDE 10 MG/ML IJ SOLN
40.0000 mg | Freq: Once | INTRAMUSCULAR | Status: AC
  Administered 2013-02-12: 40 mg via INTRAVENOUS
  Filled 2013-02-12: qty 4

## 2013-02-12 MED ORDER — ALBUTEROL (5 MG/ML) CONTINUOUS INHALATION SOLN
10.0000 mg/h | INHALATION_SOLUTION | Freq: Once | RESPIRATORY_TRACT | Status: AC
  Administered 2013-02-12: 10 mg/h via RESPIRATORY_TRACT
  Filled 2013-02-12: qty 20

## 2013-02-12 MED ORDER — METOPROLOL TARTRATE 1 MG/ML IV SOLN
5.0000 mg | Freq: Once | INTRAVENOUS | Status: AC
  Administered 2013-02-12: 5 mg via INTRAVENOUS
  Filled 2013-02-12: qty 5

## 2013-02-12 NOTE — H&P (Signed)
PCP:  Gara Kroner, MD  Cardiology [facility]  Chief Complaint:  Shortness of breath  HPI: Amber [name] is a 79 y.o. female   has a past medical history of Coronary artery disease; Hypertension; Hyperlipidemia; Hypothyroidism; Depression; Insomnia; Fibromyalgia; Gout; Osteoarthritis; Osteoporosis; Cervical spinal stenosis; Nasal polyposis; Pneumonia; COPD with emphysema; Myocardial infarction; CHF (congestive heart failure); Atrial fibrillation; CVA (cerebral vascular accident); GERD (gastroesophageal reflux disease); dizziness; Intermittent confusion; and Cancer.   Presented with  5 days hx of chest congestion, cough, denies any fever, no chest pain. She went to her PCP and was prescribed z-pack 3 days ago which she took. Her symptoms continued and today her shortness of breath got severe.  She started to wheeze and coughing up greenish sputum with some pink and brown strains.  Endorses slight leg swelling. She states she has increased her Lasix from 80 mg in AM/ 80 PM to 80/80 and additional 20 qhs.  Pateitn was found to have a.fib w RVR and evidence of PNA on CXR. She was also noted to be hypoxic needing 2L of O2. Family noted she is confused worse than baseline Hospitalist called for admission.   Review of Systems:    Pertinent positives include: chills, shortness of breath at rest.  productive cough,   Constitutional:  No weight loss, night sweats, Fevers,  fatigue, weight loss  HEENT:  No headaches, Difficulty swallowing,Tooth/dental problems,Sore throat,  No sneezing, itching, ear ache, nasal congestion, post nasal drip,  Cardio-vascular:  No chest pain, Orthopnea, PND, anasarca, dizziness, palpitations.no Bilateral lower extremity swelling  GI:  No heartburn, indigestion, abdominal pain, nausea, vomiting, diarrhea, change in bowel habits, loss of appetite, melena, blood in stool, hematemesis Resp:  no  No dyspnea on exertion, No excess  mucus, noNo non-productive cough, No coughing up of blood.No change in color of mucus.No wheezing. Skin:  no rash or lesions. No jaundice GU:  no dysuria, change in color of urine, no urgency or frequency. No straining to urinate.  No flank pain.  Musculoskeletal:  No joint pain or no joint swelling. No decreased range of motion. No back pain.  Psych:  No change in mood or affect. No depression or anxiety. No memory loss.  Neuro: no localizing neurological complaints, no tingling, no weakness, no double vision, no gait abnormality, no slurred speech, no confusion  Otherwise ROS are negative except for above, 10 systems were reviewed  Past Medical History: Past Medical History  Diagnosis Date  . Coronary artery disease   . Hypertension   . Hyperlipidemia   . Hypothyroidism   . Depression   . Insomnia   . Fibromyalgia   . Gout   . Osteoarthritis   . Osteoporosis   . Cervical spinal stenosis   . Nasal polyposis   . Pneumonia     1990  . COPD with emphysema     PFT 05/02/10>>FEV1 1.35(62%), FEV1% 66, DLCO 75%  . Myocardial infarction   . CHF (congestive heart failure)   . Atrial fibrillation   . CVA (cerebral vascular accident)     left sided weakness  . GERD (gastroesophageal reflux disease)     pepcid 2-3 times per week  . Hx of dizziness   . Intermittent confusion   . Cancer     squamous cell, melanoma   Past Surgical History  Procedure Laterality Date  . Abdominal hysterectomy    . Breast lumpectomy  1980s    Benign lesion - right  . Carpel  tunnel      right  . Tonsillectomy    . Eye surgery      bilateral cataracts  . Coronary artery bypass graft  2000  . Radiology with anesthesia  12/24/2011    Procedure: RADIOLOGY WITH ANESTHESIA;  Surgeon: Medication Radiologist, MD;  Location: [facility];  Service: Radiology;  Laterality: N/A;  Extra Cranial Vascular Stent     Medications: Prior to Admission medications   Medication Sig Start Date End Date Taking?  Authorizing Provider  albuterol (PROVENTIL HFA;VENTOLIN HFA) 108 (90 BASE) MCG/ACT inhaler Inhale 2 puffs into the lungs every 6 (six) hours as needed for wheezing or shortness of breath.   Yes Historical Provider, MD  alendronate (FOSAMAX) 70 MG tablet Take 70 mg by mouth every 7 (seven) days. Take with a full glass of water on an empty stomach. Sundays   Yes Historical Provider, MD  allopurinol (ZYLOPRIM) 300 MG tablet Take 300 mg by mouth every evening.   Yes Historical Provider, MD  amitriptyline (ELAVIL) 25 MG tablet Take 50 mg by mouth at bedtime.   Yes Historical Provider, MD  amLODipine (NORVASC) 5 MG tablet Take 5 mg by mouth every evening.  06/04/12  Yes Darlin Coco, MD  azithromycin (ZITHROMAX) 250 MG tablet Take 250-500 mg by mouth daily. Finished up medication 02/12/2013   Yes Historical Provider, MD  celecoxib (CELEBREX) 200 MG capsule Take 200 mg by mouth 2 (two) times daily.     Yes Historical Provider, MD  citalopram (CELEXA) 20 MG tablet Take 1 tablet (20 mg total) by mouth 2 (two) times daily. 12/14/12  Yes Darlin Coco, MD  clopidogrel (PLAVIX) 75 MG tablet TAKE 1 TABLET BY MOUTH EVERY DAY 01/19/13  Yes Darlin Coco, MD  digoxin (LANOXIN) 0.125 MG tablet Take 0.125 mg by mouth every evening.  09/02/12  Yes Darlin Coco, MD  donepezil (ARICEPT) 5 MG tablet Take 1 tablet (5 mg total) by mouth daily. 08/10/12  Yes Star Age, MD  famotidine (PEPCID) 20 MG tablet Take 20 mg by mouth daily as needed for heartburn.    Yes Historical Provider, MD  furosemide (LASIX) 40 MG tablet Take 40 mg by mouth See admin instructions. 2 tabs twice daily. May increase by 1 tablet twice daily if needed for fluid 06/18/12  Yes Darlin Coco, MD  KLOR-CON M20 20 MEQ tablet TAKE 1 TABLET BY MOUTH TWICE A DAY 12/28/12  Yes Darlin Coco, MD  levothyroxine (SYNTHROID, LEVOTHROID) 25 MCG tablet Take 25 mcg by mouth daily.   Yes Historical Provider, MD  metoprolol succinate (TOPROL-XL) 25 MG  24 hr tablet Take 25 mg by mouth once. 06/18/12  Yes Darlin Coco, MD  Pseudoephedrine-APAP-DM (DAYQUIL PO) Take 1 tablet by mouth once.   Yes Historical Provider, MD  valsartan (DIOVAN) 40 MG tablet Take 1 tablet (40 mg total) by mouth daily. 09/24/12  Yes Darlin Coco, MD  warfarin (COUMADIN) 5 MG tablet Take 5-7.5 mg by mouth See admin instructions. 1 tablet (5mg ) on Tues, Thurs, Sat. Then 1&1/2 (7.5mg ) on Sun, Mon, Wed, Fri (Patient takes in the evening)   Yes Historical Provider, MD  albuterol (PROVENTIL HFA;VENTOLIN HFA) 108 (90 BASE) MCG/ACT inhaler Inhale 2 puffs into the lungs every 6 (six) hours as needed. For shortness of breath 06/18/11 12/14/12  Chesley Mires, MD  nitroGLYCERIN (NITROSTAT) 0.4 MG SL tablet Place 1 tablet (0.4 mg total) under the tongue every 5 (five) minutes as needed for chest pain. 09/13/12   Darlin Coco, MD  Allergies:   Allergies  Allergen Reactions  . Ceclor [Cefaclor] Other (See Comments)    Reaction unknown  . Elastic Bandages & [Zinc] Other (See Comments)    Turns red on the areas it touches  . Lipitor [Atorvastatin Calcium] Other (See Comments)    Increased fibromyalgia pain  . Mevacor [Lovastatin] Other (See Comments)    Increased fibromyalgia pain  . Pravachol Other (See Comments)    Increased fibromyalgia pain  . Vasotec Other (See Comments)    Reaction unknown  . Adhesive [Tape] Itching and Rash    Burning   . Latex Itching and Rash    Burning     Social History:  Ambulatory  walker   Lives at   Home with family   reports that she quit smoking about 36 years ago. Her smoking use included Cigarettes. She has a 37.5 pack-year smoking history. She has never used smokeless tobacco. She reports that she does not drink alcohol or use illicit drugs.   Family History: family history includes CAD in her brother and father; Diabetes type II in her brother; Stroke in her mother.    Physical Exam: Patient Vitals for the past 24 hrs:  BP  Temp Temp src Pulse Resp SpO2  02/12/13 2251 128/85 mmHg 97.5 F (36.4 C) Oral - 20 95 %  02/12/13 2201 159/85 mmHg - - - 26 -  02/12/13 2049 - - - - - 93 %  02/12/13 1919 - - - - - 93 %  02/12/13 1842 171/83 mmHg 99.5 F (37.5 C) Oral 106 26 87 %    1. General:  in No Acute distress 2. Psychological: Alert and slightly confused 3. Head/ENT:     Dry Mucous Membranes                          Head Non traumatic, neck supple                          Normal   Dentition 4. SKIN: normal  Skin turgor,  Skin clean Dry and intact no rash 5. Heart: rapid irregular r rate and rhythm no Murmur, Rub or gallop 6. Lungs:  wheezes bilaterally and crackles, coarse breath sounds   7. Abdomen: Soft, non-tender, Non distended 8. Lower extremities: no clubbing, cyanosis, trace edema 9. Neurologically Grossly intact, moving all 4 extremities equally 10. MSK: Normal range of motion  body mass index is unknown because there is no weight on file.   Labs on Admission:   Recent Labs  02/12/13 2020  NA 137  K 3.6*  CL 92*  CO2 28  GLUCOSE 139*  BUN 11  CREATININE 0.69  CALCIUM 10.0   No results found for this basename: AST, ALT, ALKPHOS, BILITOT, PROT, ALBUMIN,  in the last 72 hours No results found for this basename: LIPASE, AMYLASE,  in the last 72 hours  Recent Labs  02/12/13 2020  WBC 13.4*  NEUTROABS 11.6*  HGB 13.8  HCT 41.3  MCV 92.0  PLT 248   No results found for this basename: CKTOTAL, CKMB, CKMBINDEX, TROPONINI,  in the last 72 hours No results found for this basename: TSH, T4TOTAL, FREET3, T3FREE, THYROIDAB,  in the last 72 hours No results found for this basename: VITAMINB12, FOLATE, FERRITIN, TIBC, IRON, RETICCTPCT,  in the last 72 hours Lab Results  Component Value Date   HGBA1C  Value: 6.3 (NOTE)  According to the ADA Clinical Practice Recommendations for 2011, when HbA1c is used as a screening test:    >=6.5%   Diagnostic of Diabetes Mellitus           (if abnormal result  is confirmed)  5.7-6.4%   Increased risk of developing Diabetes Mellitus  References:Diagnosis and Classification of Diabetes Mellitus,Diabetes [id number] 1):S62-S69 and Standards of Medical Care in         Diabetes - 2011,Diabetes [id number]  (Suppl 1):S11-S61.* 04/27/2010    The CrCl is unknown because both a height and weight (above a minimum accepted value) are required for this calculation. ABG    Component Value Date/Time   TCO2 29 09/07/2011 2024     No results found for this basename: DDIMER     Other results:  I have pearsonaly reviewed this: ECG REPORT  Rate: 119  Rhythm: a.fib w RVR ST&T Change: diffuse ST depressions   Cultures:    Component Value Date/Time   SDES BLOOD RIGHT ARM 04/27/2010 1354   SPECREQUEST BOTTLES DRAWN AEROBIC AND ANAEROBIC [place] 04/27/2010 1354   CULT NO GROWTH 5 DAYS 04/27/2010 1354   REPTSTATUS 05/03/2010 FINAL 04/27/2010 1354       Radiological Exams on Admission: Dg Chest 2 View  02/12/2013   CLINICAL DATA:  Influenza 08/06/2012  EXAM: CHEST  2 VIEW  COMPARISON:  None.  FINDINGS: No cardiomegaly. Status post CABG. Asymmetric reticular nodular opacity at the left base. No pleural effusion. No pulmonary edema.  T12 and L1 compression fractures, with questionable progression of height loss at the T12 fracture since 10/29/2012 abdominal CT  IMPRESSION: Early infiltrate/pneumonia at the left base.   Electronically Signed   By: Jorje Guild M.D.   On: 02/12/2013 22:36    Chart has been reviewed  Assessment/Plan  79 yo female history of atrial fibrillation, diastolic heart failure, COPD presents with dyspnea and hypoxia was found to have community-acquired pneumonia on chest x-ray continues to be symptomatic despite nebulizer treatments  Present on Admission:  . Atrial fibrillation w RVR - restart home medications monitor in step down,  continue Coumadin  . COPD  (chronic obstructive pulmonary disease) - Xopenex when necessary, Atrovent, Solu-Medrol taper given severity of wheezing Levaquin patient is allergic to cephalosporins  . CAP (community acquired pneumonia) - small infiltrate on chest x-ray patient does not appear to be toxic will cover empirically with antibiotics  . Diastolic heart failure secondary to hypertension - patient does monitor his to be euvolemic we'll continue her home dose of Lasix obtain echo gram cycle cardiac markers  . Hypothyroidism check TSH Dyspnea - the patient has history of coronary disease will cycle cardiac markers obtained serial EKGs. EKG showing evidence of likely rate-related demand ischemia. This is likely combination of COPD exacerbation pneumonia  Prophylaxis: coumadin, Protonix  CODE STATUS: FULL CODE  Other plan as per orders.  I have spent a total of 55 min on this admission  Terrick Allred 02/12/2013, 11:36 PM

## 2013-02-12 NOTE — ED Notes (Signed)
She states she has had u.r.i./flu sx since Mon. Of this week.  She saw her pcp, who put her on azithromycin.  She states that despite the antibiotic, she feels worse. She had an initial spo2 in the 80's; but it came up quickly to mid-90's with o2 at 2 l.p.m.  She is in no distress.

## 2013-02-12 NOTE — ED Provider Notes (Signed)
CSN: [id number]     Arrival date & time 02/12/13  1826 History   First MD Initiated Contact with Patient 02/12/13 1901     Chief Complaint  Patient presents with  . Influenza    HPI  79 year old female the history is CHF and COPD.  Four-day illness.  Started with a general illness of nausea and bodyaches.  The extent of route.  Increasing cough and increasing shortness of breath.  Fatigue.  Confusion today.  No lateralizing weakness or stroke symptoms.  Hypoxemic upon arrival.  Poor appetite and nausea but no vomiting.  No peripheral edema.  No chest pain.  Nonproductive dry cough.  Mild headache.  History coronary bypass grafting, COPD, CHF, reflux.  Past Medical History  Diagnosis Date  . Coronary artery disease   . Hypertension   . Hyperlipidemia   . Hypothyroidism   . Depression   . Insomnia   . Fibromyalgia   . Gout   . Osteoarthritis   . Osteoporosis   . Cervical spinal stenosis   . Nasal polyposis   . Pneumonia     1990  . COPD with emphysema     PFT 05/02/10>>FEV1 1.35(62%), FEV1% 66, DLCO 75%  . Myocardial infarction   . CHF (congestive heart failure)   . Atrial fibrillation   . CVA (cerebral vascular accident)     left sided weakness  . GERD (gastroesophageal reflux disease)     pepcid 2-3 times per week  . Hx of dizziness   . Intermittent confusion   . Cancer     squamous cell, melanoma   Past Surgical History  Procedure Laterality Date  . Abdominal hysterectomy    . Breast lumpectomy  1980s    Benign lesion - right  . Carpel tunnel      right  . Tonsillectomy    . Eye surgery      bilateral cataracts  . Coronary artery bypass graft  2000  . Radiology with anesthesia  12/24/2011    Procedure: RADIOLOGY WITH ANESTHESIA;  Surgeon: Medication Radiologist, MD;  Location: [facility];  Service: Radiology;  Laterality: N/A;  Extra Cranial Vascular Stent   History reviewed. No pertinent family history. History  Substance Use Topics  . Smoking status: Former  Smoker -- 1.50 packs/day for 25 years    Types: Cigarettes    Quit date: 02/10/1977  . Smokeless tobacco: Never Used  . Alcohol Use: No   OB History   Grav Para Term Preterm Abortions TAB SAB Ect Mult Living                 Review of Systems  Constitutional: Positive for fatigue. Negative for fever, chills, diaphoresis and appetite change.  HENT: Negative for mouth sores, sore throat and trouble swallowing.   Eyes: Negative for visual disturbance.  Respiratory: Positive for cough, shortness of breath and wheezing. Negative for chest tightness.   Cardiovascular: Negative for chest pain.  Gastrointestinal: Positive for nausea. Negative for vomiting, abdominal pain, diarrhea and abdominal distention.  Endocrine: Negative for polydipsia, polyphagia and polyuria.  Genitourinary: Negative for dysuria, frequency and hematuria.  Musculoskeletal: Negative for gait problem.  Skin: Negative for color change, pallor and rash.  Neurological: Positive for headaches. Negative for dizziness, syncope and light-headedness.  Hematological: Does not bruise/bleed easily.  Psychiatric/Behavioral: Positive for confusion. Negative for behavioral problems.    Allergies  Ceclor; Elastic bandages &; Lipitor; Mevacor; Pravachol; Vasotec; Adhesive; and Latex  Home Medications   Current Outpatient Rx  Name  Route  Sig  Dispense  Refill  . albuterol (PROVENTIL HFA;VENTOLIN HFA) 108 (90 BASE) MCG/ACT inhaler   Inhalation   Inhale 2 puffs into the lungs every 6 (six) hours as needed for wheezing or shortness of breath.         [name] alendronate (FOSAMAX) 70 MG tablet   Oral   Take 70 mg by mouth every 7 (seven) days. Take with a full glass of water on an empty stomach. Sundays         . allopurinol (ZYLOPRIM) 300 MG tablet   Oral   Take 300 mg by mouth every evening.         [name] amitriptyline (ELAVIL) 25 MG tablet   Oral   Take 50 mg by mouth at bedtime.         [name] amLODipine (NORVASC) 5 MG  tablet   Oral   Take 5 mg by mouth every evening.          . celecoxib (CELEBREX) 200 MG capsule   Oral   Take 200 mg by mouth 2 (two) times daily.           . citalopram (CELEXA) 20 MG tablet   Oral   Take 1 tablet (20 mg total) by mouth 2 (two) times daily.   60 tablet   5   . clopidogrel (PLAVIX) 75 MG tablet      TAKE 1 TABLET BY MOUTH EVERY DAY   30 tablet   3   . EXPIRED: albuterol (PROVENTIL HFA;VENTOLIN HFA) 108 (90 BASE) MCG/ACT inhaler   Inhalation   Inhale 2 puffs into the lungs every 6 (six) hours as needed. For shortness of breath         . COUMADIN 5 MG tablet      Take as directed by anticoagulation clinic   120 tablet   1     Dispense as written.   . cyclobenzaprine (FLEXERIL) 10 MG tablet   Oral   Take 10 mg by mouth 3 (three) times daily as needed. For muscle spasms         . digoxin (LANOXIN) 0.125 MG tablet   Oral   Take 0.125 mg by mouth every other day.          . donepezil (ARICEPT) 5 MG tablet   Oral   Take 1 tablet (5 mg total) by mouth daily.   90 tablet   1   . enoxaparin (LOVENOX) 100 MG/ML injection   Subcutaneous   Inject 1 mL (100 mg total) into the skin daily.   10 Syringe   1   . famotidine (PEPCID) 20 MG tablet   Oral   Take 20 mg by mouth as needed.          . furosemide (LASIX) 40 MG tablet   Oral   Take 80 mg by mouth daily.          [name] KLOR-CON M20 20 MEQ tablet      TAKE 1 TABLET BY MOUTH TWICE A DAY   180 tablet   1   . levothyroxine (SYNTHROID, LEVOTHROID) 25 MCG tablet   Oral   Take 25 mcg by mouth daily.         . metoprolol succinate (TOPROL-XL) 25 MG 24 hr tablet   Oral   Take 25 mg by mouth once.         . nitroGLYCERIN (NITROSTAT) 0.4 MG SL tablet   Sublingual   Place  1 tablet (0.4 mg total) under the tongue every 5 (five) minutes as needed for chest pain.   25 tablet   3   . potassium chloride SA (K-DUR,KLOR-CON) 20 MEQ tablet   Oral   Take 1 tablet (20 mEq total) by  mouth 2 (two) times daily.   180 tablet   3   . PROAIR HFA 108 (90 BASE) MCG/ACT inhaler      TAKE 2 INHALATIONS INTO LUNGS EVERY SIX HOURS AS NEEDED FOR WHEEZING   8.5 each   0     MUST KEEP OFFICE VISIT   . valsartan (DIOVAN) 40 MG tablet   Oral   Take 1 tablet (40 mg total) by mouth daily.   90 tablet   3    BP 128/85  Pulse 106  Temp(Src) 97.5 F (36.4 C) (Oral)  Resp 20  SpO2 95% Physical Exam  Constitutional: She is oriented to person, place, and time. She appears well-developed and well-nourished. No distress.  Patient not feel well.  Dyspneic with conversation  HENT:  Head: Normocephalic.  Eyes: Conjunctivae are normal. Pupils are equal, round, and reactive to light. No scleral icterus.  Neck: Normal range of motion. Neck supple. No thyromegaly present.  Cardiovascular: An irregular rhythm present. Tachycardia present.  Exam reveals no gallop and no friction rub.   No murmur heard. Irregular rate.  No gallop  Pulmonary/Chest: Effort normal and breath sounds normal. No respiratory distress. She has no wheezes. She has no rales.  Globally diminished breath sounds.  Prolongation.  Abdominal: Soft. Bowel sounds are normal. She exhibits no distension. There is no tenderness. There is no rebound.  Musculoskeletal: Normal range of motion.  Neurological: She is alert and oriented to person, place, and time.  Skin: Skin is warm and dry. No rash noted.  No dependent edema  Psychiatric: She has a normal mood and affect. Her behavior is normal.    ED Course  Procedures (including critical care time) Labs Review Labs Reviewed  CBC WITH DIFFERENTIAL - Abnormal; Notable for the following:    WBC 13.4 (*)    Neutrophils Relative % 87 (*)    Neutro Abs 11.6 (*)    Lymphocytes Relative 6 (*)    All other components within normal limits  BASIC METABOLIC PANEL - Abnormal; Notable for the following:    Potassium 3.6 (*)    Chloride 92 (*)    Glucose, Bld 139 (*)    GFR  calc non Af Amer 82 (*)    All other components within normal limits  PRO B NATRIURETIC PEPTIDE - Abnormal; Notable for the following:    Pro B Natriuretic peptide (BNP) 1485.0 (*)    All other components within normal limits  PROTIME-INR - Abnormal; Notable for the following:    Prothrombin Time 18.0 (*)    INR 1.53 (*)    All other components within normal limits  CULTURE, BLOOD (ROUTINE X 2)  CULTURE, BLOOD (ROUTINE X 2)  DIGOXIN LEVEL  INFLUENZA PANEL BY PCR  CG4 I-STAT (LACTIC ACID)  POCT I-STAT TROPONIN I   Imaging Review Dg Chest 2 View  02/12/2013   CLINICAL DATA:  Influenza 08/06/2012  EXAM: CHEST  2 VIEW  COMPARISON:  None.  FINDINGS: No cardiomegaly. Status post CABG. Asymmetric reticular nodular opacity at the left base. No pleural effusion. No pulmonary edema.  T12 and L1 compression fractures, with questionable progression of height loss at the T12 fracture since 10/29/2012 abdominal CT  IMPRESSION: Early  infiltrate/pneumonia at the left base.   Electronically Signed   By: Jorje Guild M.D.   On: 02/12/2013 22:36    EKG Interpretation   None       MDM   1. Pneumonia   2. CHF (congestive heart failure)   3. Atrial fibrillation     Differential diagnosis influenza, pneumonia, COPD exacerbation, CHF.  Sinus x-ray EKG laboratory evaluation breathing treatments steroids.    22:55:  Patient is is improved.  She became tachycardic after the nebulized treatment.  However she  subjectively feels better.  She has another left lower lobe pneumonia.  Confirmed with radiology.  Blood cultures were obtained.  Was given IV Levaquin and Zithromax.  Given her Lasix as well as her BNP is elevated.  Is to call hospitalist regarding admission    Tanna Furry, MD 02/12/13 2257

## 2013-02-12 NOTE — ED Notes (Signed)
Pt sitting in wheelchair as it is more comfortable for her.  Pt has productive cough, generalized weakness.  Lungs are congested w/ expiratory wheezing b/l.  Pt denies pain.  +nausea.

## 2013-02-13 ENCOUNTER — Encounter (HOSPITAL_COMMUNITY): Payer: Self-pay | Admitting: Cardiology

## 2013-02-13 DIAGNOSIS — Z7901 Long term (current) use of anticoagulants: Secondary | ICD-10-CM

## 2013-02-13 LAB — INFLUENZA PANEL BY PCR (TYPE A & B)
H1N1 flu by pcr: NOT DETECTED
Influenza A By PCR: NEGATIVE
Influenza B By PCR: NEGATIVE

## 2013-02-13 LAB — COMPREHENSIVE METABOLIC PANEL
ALBUMIN: 3.5 g/dL (ref 3.5–5.2)
ALT: 20 U/L (ref 0–35)
AST: 34 U/L (ref 0–37)
Alkaline Phosphatase: 45 U/L (ref 39–117)
BUN: 12 mg/dL (ref 6–23)
CALCIUM: 9.1 mg/dL (ref 8.4–10.5)
CO2: 22 mEq/L (ref 19–32)
Chloride: 91 mEq/L — ABNORMAL LOW (ref 96–112)
Creatinine, Ser: 0.71 mg/dL (ref 0.50–1.10)
GFR calc Af Amer: >90 mL/min (ref >90)
GFR calc non Af Amer: 82 mL/min — ABNORMAL LOW (ref >90)
Glucose, Bld: 253 mg/dL — ABNORMAL HIGH (ref 70–99)
Potassium: 3.6 mEq/L — ABNORMAL LOW (ref 3.7–5.3)
Sodium: 132 mEq/L — ABNORMAL LOW (ref 137–147)
TOTAL PROTEIN: 6.6 g/dL (ref 6.0–8.3)
Total Bilirubin: 0.5 mg/dL (ref 0.3–1.2)

## 2013-02-13 LAB — CBC WITH DIFFERENTIAL/PLATELET
BASOS PCT: 0 % (ref 0–1)
Basophils Absolute: 0 10*3/uL (ref 0.0–0.1)
Eosinophils Absolute: 0 10*3/uL (ref 0.0–0.7)
Eosinophils Relative: 0 % (ref 0–5)
HEMATOCRIT: 36.7 % (ref 36.0–46.0)
HEMOGLOBIN: 11.8 g/dL — AB (ref 12.0–15.0)
Lymphocytes Relative: 3 % — ABNORMAL LOW (ref 12–46)
Lymphs Abs: 0.5 10*3/uL — ABNORMAL LOW (ref 0.7–4.0)
MCH: 29.4 pg (ref 26.0–34.0)
MCHC: 32.2 g/dL (ref 30.0–36.0)
MCV: 91.5 fL (ref 78.0–100.0)
MONO ABS: 0.4 10*3/uL (ref 0.1–1.0)
MONOS PCT: 2 % — AB (ref 3–12)
Neutro Abs: 15.1 10*3/uL — ABNORMAL HIGH (ref 1.7–7.7)
Neutrophils Relative %: 94 % — ABNORMAL HIGH (ref 43–77)
Platelets: 219 10*3/uL (ref 150–400)
RBC: 4.01 MIL/uL (ref 3.87–5.11)
RDW: 14.5 % (ref 11.5–15.5)
WBC: 16 10*3/uL — ABNORMAL HIGH (ref 4.0–10.5)

## 2013-02-13 LAB — PROTIME-INR
INR: 1.6 — ABNORMAL HIGH (ref 0.00–1.49)
Prothrombin Time: 18.6 seconds — ABNORMAL HIGH (ref 11.6–15.2)

## 2013-02-13 LAB — STREP PNEUMONIAE URINARY ANTIGEN: STREP PNEUMO URINARY ANTIGEN: POSITIVE — AB

## 2013-02-13 LAB — TROPONIN I
TROPONIN I: 0.47 ng/mL — AB (ref <0.30)
Troponin I: <0.3 ng/mL (ref <0.30)
Troponin I: 0.51 ng/mL (ref <0.30)
Troponin I: 0.5 ng/mL (ref <0.30)

## 2013-02-13 LAB — MRSA PCR SCREENING: MRSA BY PCR: NEGATIVE

## 2013-02-13 MED ORDER — METOPROLOL SUCCINATE ER 25 MG PO TB24
25.0000 mg | ORAL_TABLET | Freq: Once | ORAL | Status: AC
  Administered 2013-02-13: 25 mg via ORAL
  Filled 2013-02-13: qty 1

## 2013-02-13 MED ORDER — DEXTROSE 5 % IV SOLN
500.0000 mg | INTRAVENOUS | Status: DC
  Administered 2013-02-13: 500 mg via INTRAVENOUS
  Filled 2013-02-13: qty 500

## 2013-02-13 MED ORDER — FUROSEMIDE 80 MG PO TABS
80.0000 mg | ORAL_TABLET | Freq: Two times a day (BID) | ORAL | Status: DC
  Administered 2013-02-13 (×2): 80 mg via ORAL
  Filled 2013-02-13 (×5): qty 1

## 2013-02-13 MED ORDER — IRBESARTAN 75 MG PO TABS
75.0000 mg | ORAL_TABLET | Freq: Every day | ORAL | Status: DC
Start: 2013-02-13 — End: 2013-02-19
  Administered 2013-02-13 – 2013-02-19 (×7): 75 mg via ORAL
  Filled 2013-02-13 (×7): qty 1

## 2013-02-13 MED ORDER — IPRATROPIUM BROMIDE 0.02 % IN SOLN
0.5000 mg | Freq: Four times a day (QID) | RESPIRATORY_TRACT | Status: DC
  Administered 2013-02-13 – 2013-02-15 (×11): 0.5 mg via RESPIRATORY_TRACT
  Filled 2013-02-13 (×12): qty 2.5

## 2013-02-13 MED ORDER — SODIUM CHLORIDE 0.9 % IV SOLN: 250.0000 mL | INTRAVENOUS | Status: DC | PRN

## 2013-02-13 MED ORDER — METOPROLOL TARTRATE 25 MG PO TABS
25.0000 mg | ORAL_TABLET | Freq: Two times a day (BID) | ORAL | Status: DC
  Administered 2013-02-13 – 2013-02-14 (×5): 25 mg via ORAL
  Filled 2013-02-13 (×6): qty 1

## 2013-02-13 MED ORDER — SODIUM CHLORIDE 0.9 % IJ SOLN
3.0000 mL | Freq: Two times a day (BID) | INTRAMUSCULAR | Status: DC
  Administered 2013-02-13 – 2013-02-18 (×10): 3 mL via INTRAVENOUS

## 2013-02-13 MED ORDER — ASPIRIN 81 MG PO CHEW
324.0000 mg | CHEWABLE_TABLET | Freq: Once | ORAL | Status: AC
  Administered 2013-02-13: 324 mg via ORAL
  Filled 2013-02-13: qty 4

## 2013-02-13 MED ORDER — METHYLPREDNISOLONE SODIUM SUCC 125 MG IJ SOLR
60.0000 mg | Freq: Two times a day (BID) | INTRAMUSCULAR | Status: DC
  Administered 2013-02-13 (×2): 60 mg via INTRAVENOUS
  Filled 2013-02-13 (×5): qty 0.96

## 2013-02-13 MED ORDER — CLOPIDOGREL BISULFATE 75 MG PO TABS
75.0000 mg | ORAL_TABLET | Freq: Every day | ORAL | Status: DC
  Administered 2013-02-13 – 2013-02-19 (×7): 75 mg via ORAL
  Filled 2013-02-13 (×8): qty 1

## 2013-02-13 MED ORDER — AMITRIPTYLINE HCL 50 MG PO TABS
50.0000 mg | ORAL_TABLET | Freq: Every day | ORAL | Status: DC
  Administered 2013-02-13 – 2013-02-18 (×7): 50 mg via ORAL
  Filled 2013-02-13 (×8): qty 1

## 2013-02-13 MED ORDER — WARFARIN SODIUM 5 MG PO TABS
5.0000 mg | ORAL_TABLET | Freq: Once | ORAL | Status: AC
  Administered 2013-02-13: 5 mg via ORAL
  Filled 2013-02-13: qty 1

## 2013-02-13 MED ORDER — AMLODIPINE BESYLATE 5 MG PO TABS
5.0000 mg | ORAL_TABLET | Freq: Every evening | ORAL | Status: DC
  Administered 2013-02-13 – 2013-02-18 (×6): 5 mg via ORAL
  Filled 2013-02-13 (×7): qty 1

## 2013-02-13 MED ORDER — DONEPEZIL HCL 5 MG PO TABS
5.0000 mg | ORAL_TABLET | Freq: Every day | ORAL | Status: DC
  Administered 2013-02-13 – 2013-02-19 (×7): 5 mg via ORAL
  Filled 2013-02-13 (×7): qty 1

## 2013-02-13 MED ORDER — LEVALBUTEROL HCL 0.63 MG/3ML IN NEBU
0.6300 mg | INHALATION_SOLUTION | RESPIRATORY_TRACT | Status: DC | PRN
  Administered 2013-02-13 – 2013-02-14 (×4): 0.63 mg via RESPIRATORY_TRACT
  Filled 2013-02-13 (×5): qty 3

## 2013-02-13 MED ORDER — LEVOTHYROXINE SODIUM 25 MCG PO TABS
25.0000 ug | ORAL_TABLET | Freq: Every day | ORAL | Status: DC
  Administered 2013-02-13 – 2013-02-19 (×7): 25 ug via ORAL
  Filled 2013-02-13 (×8): qty 1

## 2013-02-13 MED ORDER — ALLOPURINOL 300 MG PO TABS
300.0000 mg | ORAL_TABLET | Freq: Every evening | ORAL | Status: DC
  Administered 2013-02-13 – 2013-02-18 (×6): 300 mg via ORAL
  Filled 2013-02-13 (×7): qty 1

## 2013-02-13 MED ORDER — GUAIFENESIN ER 600 MG PO TB12
600.0000 mg | ORAL_TABLET | Freq: Two times a day (BID) | ORAL | Status: DC
  Administered 2013-02-13 – 2013-02-19 (×14): 600 mg via ORAL
  Filled 2013-02-13 (×15): qty 1

## 2013-02-13 MED ORDER — DM-GUAIFENESIN ER 30-600 MG PO TB12
1.0000 | ORAL_TABLET | Freq: Two times a day (BID) | ORAL | Status: DC
  Administered 2013-02-13: 1 via ORAL
  Filled 2013-02-13 (×3): qty 1

## 2013-02-13 MED ORDER — CITALOPRAM HYDROBROMIDE 20 MG PO TABS
20.0000 mg | ORAL_TABLET | Freq: Two times a day (BID) | ORAL | Status: DC
  Administered 2013-02-13 – 2013-02-19 (×14): 20 mg via ORAL
  Filled 2013-02-13 (×15): qty 1

## 2013-02-13 MED ORDER — POTASSIUM CHLORIDE CRYS ER 20 MEQ PO TBCR
20.0000 meq | EXTENDED_RELEASE_TABLET | Freq: Every day | ORAL | Status: DC
Start: 2013-02-13 — End: 2013-02-19
  Administered 2013-02-13 – 2013-02-19 (×7): 20 meq via ORAL
  Filled 2013-02-13 (×7): qty 1

## 2013-02-13 MED ORDER — FAMOTIDINE 20 MG PO TABS
20.0000 mg | ORAL_TABLET | Freq: Every day | ORAL | Status: DC | PRN
  Administered 2013-02-19: 20 mg via ORAL
  Filled 2013-02-13: qty 1

## 2013-02-13 MED ORDER — LEVOFLOXACIN IN D5W 750 MG/150ML IV SOLN: 750.0000 mg | INTRAVENOUS | Status: DC

## 2013-02-13 MED ORDER — DIGOXIN 125 MCG PO TABS
0.1250 mg | ORAL_TABLET | Freq: Every evening | ORAL | Status: DC
  Administered 2013-02-14 – 2013-02-18 (×5): 0.125 mg via ORAL
  Filled 2013-02-13 (×7): qty 1

## 2013-02-13 MED ORDER — WARFARIN - PHARMACIST DOSING INPATIENT: Freq: Every day | Status: DC

## 2013-02-13 MED ORDER — PANTOPRAZOLE SODIUM 40 MG PO TBEC
40.0000 mg | DELAYED_RELEASE_TABLET | Freq: Every day | ORAL | Status: DC
  Administered 2013-02-13 – 2013-02-18 (×6): 40 mg via ORAL
  Filled 2013-02-13 (×8): qty 1

## 2013-02-13 MED ORDER — WARFARIN SODIUM 7.5 MG PO TABS
7.5000 mg | ORAL_TABLET | Freq: Once | ORAL | Status: AC
  Administered 2013-02-13: 7.5 mg via ORAL
  Filled 2013-02-13: qty 1

## 2013-02-13 MED ORDER — SODIUM CHLORIDE 0.9 % IJ SOLN
3.0000 mL | INTRAMUSCULAR | Status: DC | PRN
  Administered 2013-02-14: 3 mL via INTRAVENOUS

## 2013-02-13 NOTE — Progress Notes (Signed)
[facility] PROGRESS NOTE  Amber [name] [id number] DOB: Nov 01, 1934 DOA: 02/12/2013 PCP: Gara Kroner, MD  Assessment/Plan  A-fib with RVR, resolved.  Rate currently 60s-70s.   -  Continue coumadin and dig -  Cardiology adding metoprolol  NSTEMI, likely type 2, chest pain free, ECG demonstrates some deeper ST segment depressions in V1 through V5. -  Cycle troponins -  Cardiology consultation, followed by Dr. Mare Ferrari -  If troponins are rapidly trending, stop Coumadin and start heparin drip -  ECHO  COPD exacerbation, improving -  Continue Solu-Medrol twice a day today and consider transitioning to prednisone tomorrow -  Continue bronchodilators -  Continue antibiotics  Strep pneumo pneumonia -  DC levofloxacin -  Start azithromycin 500 mg daily x3 days total (has ceph allergy)  HTN, blood pressure wnl (low diastolic) -  Continue norvasc and ARB  Diastolic heart failure, stable -  Continue lasix BID  Dementia/depression, stable. Continue aricept and celexa  Hypothyroid, stable.  Continue synthroid  Diet:  Healthy heart Access:  PIV IVF:  off Proph:  Coumadin   Code Status: full Family Communication: patient  Disposition Plan: pending improvement in breathing   Consultants:  Cardiology  Procedures:  CXR  Antibiotics:  Ceftriaxone 1/4x1  Azithromycin 1/4  Levofloxacin 1/4x1   HPI/Subjective:  Patient states that she feels much better than yesterday, her shortness of breath is improving. She still wheezing some and has some cough. Her energy is better. She denies lightheadedness or chest pain  Objective: Filed Vitals:   02/13/13 0819 02/13/13 0820 02/13/13 1100 02/13/13 1300  BP: 146/38  123/99 137/46  Pulse:  66 74 67  Temp:      TempSrc:      Resp:  20 20 25   Height:      Weight:      SpO2:  100% 96% 95%    Intake/Output Summary (Last 24 hours) at 02/13/13 1324 Last data filed at 02/13/13 0500  Gross per 24 hour  Intake     330 ml  Output      0 ml  Net    330 ml   Filed Weights   02/13/13 0132  Weight: 75.8 kg (167 lb 1.7 oz)    Exam:   General:  Caucasian female, No acute distress  HEENT:  NCAT, MMM  Cardiovascular:  IRRR, nl S1, S2 no mrg, 2+ pulses, warm extremities  Respiratory:  Diminished bilateral breath sounds worse on the left base and some focal rales heard at the left base to the mid back on the left side. Positive wheeze, positive rhonchi, no increased WOB  Abdomen:   NABS, soft, NT/ND  MSK:   Normal tone and bulk, no LEE  Neuro:  Grossly intact  Data Reviewed: Basic Metabolic Panel:  Recent Labs Lab 02/12/13 2020 02/13/13 0249  NA 137 132*  K 3.6* 3.6*  CL 92* 91*  CO2 28 22  GLUCOSE 139* 253*  BUN 11 12  CREATININE 0.69 0.71  CALCIUM 10.0 9.1   Liver Function Tests:  Recent Labs Lab 02/13/13 0249  AST 34  ALT 20  ALKPHOS 45  BILITOT 0.5  PROT 6.6  ALBUMIN 3.5   No results found for this basename: LIPASE, AMYLASE,  in the last 168 hours No results found for this basename: AMMONIA,  in the last 168 hours CBC:  Recent Labs Lab 02/12/13 2020 02/13/13 0249  WBC 13.4* 16.0*  NEUTROABS 11.6* 15.1*  HGB 13.8 11.8*  HCT 41.3 36.7  MCV 92.0 91.5  PLT 248 219   Cardiac Enzymes:  Recent Labs Lab 02/13/13 0249 02/13/13 0738  TROPONINI <0.30 0.51*   BNP (last 3 results)  Recent Labs  02/12/13 2020  PROBNP 1485.0*   CBG: No results found for this basename: GLUCAP,  in the last 168 hours  Recent Results (from the past 240 hour(s))  MRSA PCR SCREENING     Status: None   Collection Time    02/13/13  1:01 AM      Result Value Range Status   MRSA by PCR NEGATIVE  NEGATIVE Final   Comment:            The GeneXpert MRSA Assay (FDA     approved for NASAL specimens     only), is one component of a     comprehensive MRSA colonization     surveillance program. It is not     intended to diagnose MRSA     infection nor to guide or     monitor  treatment for     MRSA infections.     Studies: Dg Chest 2 View  02/12/2013   CLINICAL DATA:  Influenza 08/06/2012  EXAM: CHEST  2 VIEW  COMPARISON:  None.  FINDINGS: No cardiomegaly. Status post CABG. Asymmetric reticular nodular opacity at the left base. No pleural effusion. No pulmonary edema.  T12 and L1 compression fractures, with questionable progression of height loss at the T12 fracture since 10/29/2012 abdominal CT  IMPRESSION: Early infiltrate/pneumonia at the left base.   Electronically Signed   By: Jorje Guild M.D.   On: 02/12/2013 22:36    Scheduled Meds: . allopurinol  300 mg Oral QPM  . amitriptyline  50 mg Oral QHS  . amLODipine  5 mg Oral QPM  . azithromycin  500 mg Intravenous Q24H  . citalopram  20 mg Oral BID  . clopidogrel  75 mg Oral Q breakfast  . digoxin  0.125 mg Oral QPM  . donepezil  5 mg Oral Daily  . furosemide  80 mg Oral BID  . guaiFENesin  600 mg Oral BID  . ipratropium  0.5 mg Nebulization Q6H  . irbesartan  75 mg Oral Daily  . levothyroxine  25 mcg Oral QAC breakfast  . methylPREDNISolone (SOLU-MEDROL) injection  60 mg Intravenous Q12H  . pantoprazole  40 mg Oral Q1200  . potassium chloride SA  20 mEq Oral Daily  . sodium chloride  3 mL Intravenous Q12H  . warfarin  5 mg Oral ONCE-1800  . Warfarin - Pharmacist Dosing Inpatient   Does not apply q1800   Continuous Infusions:   Active Problems:   Atrial fibrillation   Hypothyroidism   COPD (chronic obstructive pulmonary disease)   Diastolic heart failure secondary to hypertension   CAP (community acquired pneumonia)   Long-term (current) use of anticoagulants    Time spent: 30 min    Rahkeem Senft, [facility] Pager [phone]. If 7PM-7AM, please contact night-coverage at [email or web address], password [facility] 02/13/2013, 1:24 PM  LOS: 1 day

## 2013-02-13 NOTE — Progress Notes (Signed)
  Echocardiogram 2D Echocardiogram has been performed.  Amber [name] 02/13/2013, 1:18 PM

## 2013-02-13 NOTE — Consult Note (Signed)
Cardiology Consult Note  Admit date: 02/12/2013 Name: Amber [name] 79 y.o.  female DOB:  December 16, 1934 MRN:  [id number]  Today's date:  02/13/2013  Referring Physician:   Dr. Edwin Cap  Primary Physician:    Dr. Antony Contras  Reason for Consultation:    Rapid atrial fibrillation and abnormal troponin  IMPRESSIONS: 1. Rapid atrial fibrillation on admission likely due to underlying pneumonia 2. Demand ischemia related to rapid atrial fibrillation 3. Trivial elevation of troponin likely due to rapid atrial fibrillation 4. And left lower lobe pneumococcal pneumonia 5. Coronary artery disease with prior bypass grafting 6. Chronic atrial fibrillation 7. Long-term anticoagulation with warfarin which is subtherapeutic 8. Chronic diastolic congestive heart failure 9. COPD 10. Hypertensive heart disease 11. Prior history of pontine stroke with mild left hemiparesis  RECOMMENDATION: 1. I don't think that she needs to have an ischemic workup at this time unless the troponin becomes severely abnormal. She does have significant T wave changes on her EKG that may be related to her recent rapid atrial fibrillation. I would not use heparin at this time and would continue her on warfarin. 2. Add metoprolol 3. Will review echocardiogram 4. Continue treatment for pneumonia.  HISTORY: 79 year-old family has a history of coronary artery disease with previous infarction and previous bypass grafting. She has a history of hypertension, chronic  diastolic heart failure and has chronic atrial fibrillation was anticoagulated. She also significant fibromyalgia and has statin intolerance. She has a history of chronic dyspnea and has had intermittent doses of furosemide. She had a myocardial perfusion scan done this past summer that showed no evidence of ischemia.  She developed cough productive of purulent sputum and saw her family physician on Wednesday was treated with a Z-Pak but had wheezing, cough  production of pink and blood-tinged sputum and presented to the emergency room despite increasing her furosemide. She was hypoxic when she came in and had some confusion. She was found to be in atrial fibrillation with a rapid ventricular response and also had ST depression that had worsened. She felt a mild elevation of troponin and cardiology was consulted. Her warfarin level was subtherapeutic. She does not have any significant chest pain suggestive of angina. She has a positive urinary marker for pneumococcus. Currently is feeling better.  Past Medical History  Diagnosis Date  . Coronary artery disease   . Hypertension   . Hyperlipidemia   . Hypothyroidism   . Depression   . Insomnia   . Fibromyalgia   . Gout   . Osteoarthritis   . Osteoporosis   . Cervical spinal stenosis   . Nasal polyposis   . Pneumonia     1990  . COPD with emphysema     PFT 05/02/10>>FEV1 1.35(62%), FEV1% 66, DLCO 75%  . Myocardial infarction   . CHF (congestive heart failure)   . Atrial fibrillation   . CVA (cerebral vascular accident)     left sided weakness  . GERD (gastroesophageal reflux disease)     pepcid 2-3 times per week  . Intermittent confusion   . History of melanoma     squamous cell, melanoma      Past Surgical History  Procedure Laterality Date  . Abdominal hysterectomy    . Breast lumpectomy  1980s    Benign lesion - right  . Carpel tunnel      right  . Tonsillectomy    . Eye surgery      bilateral cataracts  . Coronary artery  bypass graft  2000  . Radiology with anesthesia  12/24/2011    Procedure: RADIOLOGY WITH ANESTHESIA;  Surgeon: Medication Radiologist, MD;  Location: [facility];  Service: Radiology;  Laterality: N/A;  Extra Cranial Vascular Stent     Allergies:  is allergic to ceclor; elastic bandages &; lipitor; mevacor; pravachol; vasotec; adhesive; and latex.   Medications: Prior to Admission medications   Medication Sig Start Date End Date Taking? Authorizing  Provider  albuterol (PROVENTIL HFA;VENTOLIN HFA) 108 (90 BASE) MCG/ACT inhaler Inhale 2 puffs into the lungs every 6 (six) hours as needed for wheezing or shortness of breath.   Yes Historical Provider, MD  alendronate (FOSAMAX) 70 MG tablet Take 70 mg by mouth every 7 (seven) days. Take with a full glass of water on an empty stomach. Sundays   Yes Historical Provider, MD  allopurinol (ZYLOPRIM) 300 MG tablet Take 300 mg by mouth every evening.   Yes Historical Provider, MD  amitriptyline (ELAVIL) 25 MG tablet Take 50 mg by mouth at bedtime.   Yes Historical Provider, MD  amLODipine (NORVASC) 5 MG tablet Take 5 mg by mouth every evening.  06/04/12  Yes Darlin Coco, MD  azithromycin (ZITHROMAX) 250 MG tablet Take 250-500 mg by mouth daily. Finished up medication 02/12/2013   Yes Historical Provider, MD  celecoxib (CELEBREX) 200 MG capsule Take 200 mg by mouth 2 (two) times daily.     Yes Historical Provider, MD  citalopram (CELEXA) 20 MG tablet Take 1 tablet (20 mg total) by mouth 2 (two) times daily. 12/14/12  Yes Darlin Coco, MD  clopidogrel (PLAVIX) 75 MG tablet TAKE 1 TABLET BY MOUTH EVERY DAY 01/19/13  Yes Darlin Coco, MD  digoxin (LANOXIN) 0.125 MG tablet Take 0.125 mg by mouth every evening.  09/02/12  Yes Darlin Coco, MD  donepezil (ARICEPT) 5 MG tablet Take 1 tablet (5 mg total) by mouth daily. 08/10/12  Yes Star Age, MD  famotidine (PEPCID) 20 MG tablet Take 20 mg by mouth daily as needed for heartburn.    Yes Historical Provider, MD  furosemide (LASIX) 40 MG tablet Take 40 mg by mouth See admin instructions. 2 tabs twice daily. May increase by 1 tablet twice daily if needed for fluid 06/18/12  Yes Darlin Coco, MD  KLOR-CON M20 20 MEQ tablet TAKE 1 TABLET BY MOUTH TWICE A DAY 12/28/12  Yes Darlin Coco, MD  levothyroxine (SYNTHROID, LEVOTHROID) 25 MCG tablet Take 25 mcg by mouth daily.   Yes Historical Provider, MD  metoprolol succinate (TOPROL-XL) 25 MG 24 hr tablet  Take 25 mg by mouth once. 06/18/12  Yes Darlin Coco, MD  Pseudoephedrine-APAP-DM (DAYQUIL PO) Take 1 tablet by mouth once.   Yes Historical Provider, MD  valsartan (DIOVAN) 40 MG tablet Take 1 tablet (40 mg total) by mouth daily. 09/24/12  Yes Darlin Coco, MD  warfarin (COUMADIN) 5 MG tablet Take 5-7.5 mg by mouth See admin instructions. 1 tablet (5mg ) on Tues, Thurs, Sat. Then 1&1/2 (7.5mg ) on Sun, Mon, Wed, Fri (Patient takes in the evening)   Yes Historical Provider, MD  albuterol (PROVENTIL HFA;VENTOLIN HFA) 108 (90 BASE) MCG/ACT inhaler Inhale 2 puffs into the lungs every 6 (six) hours as needed. For shortness of breath 06/18/11 12/14/12  Chesley Mires, MD  nitroGLYCERIN (NITROSTAT) 0.4 MG SL tablet Place 1 tablet (0.4 mg total) under the tongue every 5 (five) minutes as needed for chest pain. 09/13/12   Darlin Coco, MD    Family History: Family Status  Relation Status  Death Age  . Mother Deceased     stroke  . Father Deceased     cad  . Brother Deceased     Social History:   reports that she quit smoking about 36 years ago. Her smoking use included Cigarettes. She has a 37.5 pack-year smoking history. She has never used smokeless tobacco. She reports that she does not drink alcohol or use illicit drugs.   History   Social History Narrative  . No narrative on file    Review of Systems: She has had some moderate confusion. She still is at home with her husband. She has had some urinary frequency and urgency complains of edema in her legs at night. Other than as noted above remainder of the review of systems is unremarkable  Physical Exam: BP 137/46  Pulse 67  Temp(Src) 97.5 F (36.4 C) (Oral)  Resp 25  Ht 5\' 4"  (1.626 m)  Wt 75.8 kg (167 lb 1.7 oz)  BMI 28.67 kg/m2  SpO2 95%  General appearance: Pleasant female, mildly obese in no acute distress, mildly confused Head: Normocephalic, without obvious abnormality, atraumatic Eyes: conjunctivae/corneas clear. PERRL,  EOM's intact. Fundi benign. Neck: no adenopathy, no carotid bruit, no JVD and supple, symmetrical, trachea midline Lungs: Diminished breath sounds and rales at left base Heart: Irregular rate and rhythm, normal S1-S2 no S3 Abdomen: soft, non-tender; bowel sounds normal; no masses,  no organomegaly Pelvic: deferred Extremities: No edema noted, mild contraction of toes on the left foot Pulses: Femoral pulses are 2+, peripheral pulses palpable but diminished Skin: Skin color, texture, turgor normal. No rashes or lesions Neurologic: Grossly normal, gait not formally tested   Labs: CBC  Recent Labs  02/13/13 0249  WBC 16.0*  RBC 4.01  HGB 11.8*  HCT 36.7  PLT 219  MCV 91.5  MCH 29.4  MCHC 32.2  RDW 14.5  LYMPHSABS 0.5*  MONOABS 0.4  EOSABS 0.0  BASOSABS 0.0   CMP   Recent Labs  02/13/13 0249  NA 132*  K 3.6*  CL 91*  CO2 22  GLUCOSE 253*  BUN 12  CREATININE 0.71  CALCIUM 9.1  PROT 6.6  ALBUMIN 3.5  AST 34  ALT 20  ALKPHOS 45  BILITOT 0.5  GFRNONAA 82*  GFRAA >90   BNP (last 3 results)  Recent Labs  02/12/13 2020  PROBNP 1485.0*   Cardiac Panel (last 3 results)  Recent Labs  02/13/13 0249 02/13/13 0738  TROPONINI <0.30 0.51*     Radiology: Left lower lobe pneumonia  EKG: Initial EKG shows significant ST depression inferolateral leads, followup EKG shows a much slower rate but some new T-wave inversions in V2 and V3.  Signed:  Kerry Hough MD [facility]   Cardiology Consultant  02/13/2013, 1:19 PM

## 2013-02-13 NOTE — Progress Notes (Signed)
ANTICOAGULATION CONSULT NOTE - Initial Consult  Pharmacy Consult for Warfarin Indication: atrial fibrillation  Allergies  Allergen Reactions  . Ceclor [Cefaclor] Other (See Comments)    Reaction unknown  . Elastic Bandages & [Zinc] Other (See Comments)    Turns red on the areas it touches  . Lipitor [Atorvastatin Calcium] Other (See Comments)    Increased fibromyalgia pain  . Mevacor [Lovastatin] Other (See Comments)    Increased fibromyalgia pain  . Pravachol Other (See Comments)    Increased fibromyalgia pain  . Vasotec Other (See Comments)    Reaction unknown  . Adhesive [Tape] Itching and Rash    Burning   . Latex Itching and Rash    Burning     Patient Measurements: Height: 5\' 4"  (162.6 cm) Weight: 167 lb 1.7 oz (75.8 kg) IBW/kg (Calculated) : 54.7  Vital Signs: Temp: 99 F (37.2 C) (01/04 0015) Temp src: Oral (01/04 0015) BP: 134/48 mmHg (01/04 0015) Pulse Rate: 95 (01/04 0015)  Labs:  Recent Labs  02/12/13 2020  HGB 13.8  HCT 41.3  PLT 248  LABPROT 18.0*  INR 1.53*  CREATININE 0.69    Estimated Creatinine Clearance: 59.6 ml/min (by C-G formula based on Cr of 0.69).   Medical History: Past Medical History  Diagnosis Date  . Coronary artery disease   . Hypertension   . Hyperlipidemia   . Hypothyroidism   . Depression   . Insomnia   . Fibromyalgia   . Gout   . Osteoarthritis   . Osteoporosis   . Cervical spinal stenosis   . Nasal polyposis   . Pneumonia     1990  . COPD with emphysema     PFT 05/02/10>>FEV1 1.35(62%), FEV1% 66, DLCO 75%  . Myocardial infarction   . CHF (congestive heart failure)   . Atrial fibrillation   . CVA (cerebral vascular accident)     left sided weakness  . GERD (gastroesophageal reflux disease)     pepcid 2-3 times per week  . Hx of dizziness   . Intermittent confusion   . Cancer     squamous cell, melanoma    Medications:  Scheduled:  . allopurinol  300 mg Oral QPM  . amitriptyline  50 mg Oral QHS   . amLODipine  5 mg Oral QPM  . citalopram  20 mg Oral BID  . clopidogrel  75 mg Oral Q breakfast  . dextromethorphan-guaiFENesin  1 tablet Oral BID  . digoxin  0.125 mg Oral QPM  . donepezil  5 mg Oral Daily  . furosemide  80 mg Oral BID  . guaiFENesin  600 mg Oral BID  . ipratropium  0.5 mg Nebulization Q6H  . irbesartan  75 mg Oral Daily  . levofloxacin (LEVAQUIN) IV  750 mg Intravenous Q24H  . levothyroxine  25 mcg Oral QAC breakfast  . methylPREDNISolone (SOLU-MEDROL) injection  60 mg Intravenous Q12H  . metoprolol succinate  25 mg Oral Once  . pantoprazole  40 mg Oral Q1200  . potassium chloride SA  20 mEq Oral Daily  . sodium chloride  3 mL Intravenous Q12H  . Warfarin - Pharmacist Dosing Inpatient   Does not apply q1800   Infusions:    Assessment:  79 yr female presents with complaints of shortness of breath - admitted with CAP and placed on Levaquin  H/O AFib and on warfarin 7.5mg  daily except for 5 mg on Tue/Thur/Sat PTA - last dose taken on 02/11/13  INR = 1.53 on 02/12/13 (subtherapeutic)  Goal of Therapy:  INR 2-3   Plan:  Warfarin 7.5mg  po x 1 now Check daily PT/INR Closely watch INR while on Levaquin, as it can cause increase in INR  Doak Mah, Toribio Harbour, PharmD 02/13/2013,1:33 AM

## 2013-02-13 NOTE — Progress Notes (Addendum)
CRITICAL VALUE ALERT  Critical value received:  Troponin 0.50   Date of notification:  02/14/12  Time of notification:  2054  Critical value read back:yes  Nurse who received alert: Darrin Nipper, RN   MD notified (1st page): [phone]  Time of first page: 2216   MD notified (2nd page):  Time of second page:  Responding MD: Reidler   Time MD responded: 2216

## 2013-02-13 NOTE — Progress Notes (Signed)
ANTICOAGULATION CONSULT NOTE - Follow Up Consult  Pharmacy Consult for warfarin Indication: atrial fibrillation  Allergies  Allergen Reactions  . Ceclor [Cefaclor] Other (See Comments)    Reaction unknown  . Elastic Bandages & [Zinc] Other (See Comments)    Turns red on the areas it touches  . Lipitor [Atorvastatin Calcium] Other (See Comments)    Increased fibromyalgia pain  . Mevacor [Lovastatin] Other (See Comments)    Increased fibromyalgia pain  . Pravachol Other (See Comments)    Increased fibromyalgia pain  . Vasotec Other (See Comments)    Reaction unknown  . Adhesive [Tape] Itching and Rash    Burning   . Latex Itching and Rash    Burning     Patient Measurements: Height: 5\' 4"  (162.6 cm) Weight: 167 lb 1.7 oz (75.8 kg) IBW/kg (Calculated) : 54.7 Heparin Dosing Weight:   Vital Signs: Temp: 98.2 F (36.8 C) (01/04 0400) Temp src: Oral (01/04 0400) BP: 138/44 mmHg (01/04 0400) Pulse Rate: 95 (01/04 0015)  Labs:  Recent Labs  02/12/13 2020 02/13/13 0249  HGB 13.8 11.8*  HCT 41.3 36.7  PLT 248 219  LABPROT 18.0* 18.6*  INR 1.53* 1.60*  CREATININE 0.69 0.71  TROPONINI  --  <0.30    Estimated Creatinine Clearance: 59.6 ml/min (by C-G formula based on Cr of 0.71).  Assessment: 79 yr female presents with complaints of shortness of breath - admitted with CAP and placed on Levaquin. She is on warfarin therapy for atrial fibrillation, pharmacy asked to dose warfarin while in hospital.  INR at time of admission was subtherapeutic @ 1.53.  Patient given 7.5mg  PO x 1 at ~2am 1/4.  She had not taken a dose 1/3.    Home dose: warfarin 7.5mg  daily except for 5 mg on Tue/Thur/Sat PTA - last dose taken on 02/11/13  Last visit to coumadin clinic, INR = 2.4 on above dose  Today's INR: 1.6  CBC: Hgb = 11.8, platelets WNL  Potential drug interactions with warfarin: Levofloxacin and corticosteroids - both my increase warfarin's effect   Goal of Therapy:  INR  2-3 Monitor platelets by anticoagulation protocol: Yes   Plan:   Since 7.5mg  given during the night to boost INR, will give warfarin 5mg  PO x 1 tonight d/t concerns of potential drug interactions and acute illness may enhance warfarin's effect.  Daily INR  Doreene Eland, PharmD, BCPS.   Pager: [phone]  02/13/2013,7:09 AM

## 2013-02-13 NOTE — Progress Notes (Signed)
Troponin 0.51.  ECG demonstrates deeper ST segment depressions in V1-V6 compared to ECG from May 2013.  Possible NSTEMI type 1, although currently chest pain free and given a-fib with RVR last night, more likely to be STEMI type 2.  Consulted cardiology.  Give ASA 324mg  and continue statin, beta blocker.  Currently on warfarin with subtherapeutic INR.  Will not transition to heparin at this time pending cardiology consultation.

## 2013-02-13 NOTE — Progress Notes (Signed)
CRITICAL VALUE ALERT  Critical value received:  Trop 0.51  Date of notification:  02/13/2013  Time of notification:  0838  Critical value read back: yes  Nurse who received alert:  Jacklynn Lewis, RN  MD notified (1st page):  Short  Time of first page:  [phone] MD notified (2nd page):  Time of second page: no 2nd page  Responding MD:  Short  Time MD responded: 0900

## 2013-02-14 ENCOUNTER — Inpatient Hospital Stay (HOSPITAL_COMMUNITY): Payer: Medicare Other

## 2013-02-14 DIAGNOSIS — F05 Delirium due to known physiological condition: Secondary | ICD-10-CM

## 2013-02-14 DIAGNOSIS — IMO0002 Reserved for concepts with insufficient information to code with codable children: Secondary | ICD-10-CM

## 2013-02-14 DIAGNOSIS — I5033 Acute on chronic diastolic (congestive) heart failure: Secondary | ICD-10-CM

## 2013-02-14 DIAGNOSIS — I509 Heart failure, unspecified: Secondary | ICD-10-CM

## 2013-02-14 DIAGNOSIS — R7989 Other specified abnormal findings of blood chemistry: Secondary | ICD-10-CM

## 2013-02-14 LAB — PRO B NATRIURETIC PEPTIDE: Pro B Natriuretic peptide (BNP): 5472 pg/mL — ABNORMAL HIGH (ref 0–450)

## 2013-02-14 LAB — PROTIME-INR
INR: 2.17 — ABNORMAL HIGH (ref 0.00–1.49)
PROTHROMBIN TIME: 23.5 s — AB (ref 11.6–15.2)

## 2013-02-14 LAB — BASIC METABOLIC PANEL
BUN: 22 mg/dL (ref 6–23)
CHLORIDE: 96 meq/L (ref 96–112)
CO2: 25 mEq/L (ref 19–32)
Calcium: 9.4 mg/dL (ref 8.4–10.5)
Creatinine, Ser: 0.71 mg/dL (ref 0.50–1.10)
GFR calc Af Amer: >90 mL/min (ref >90)
GFR calc non Af Amer: 82 mL/min — ABNORMAL LOW (ref >90)
Glucose, Bld: 176 mg/dL — ABNORMAL HIGH (ref 70–99)
Potassium: 4.5 mEq/L (ref 3.7–5.3)
Sodium: 134 mEq/L — ABNORMAL LOW (ref 137–147)

## 2013-02-14 LAB — LEGIONELLA ANTIGEN, URINE: Legionella Antigen, Urine: NEGATIVE

## 2013-02-14 LAB — TROPONIN I
TROPONIN I: 0.41 ng/mL — AB (ref <0.30)
Troponin I: 0.52 ng/mL (ref <0.30)
Troponin I: 0.36 ng/mL (ref <0.30)

## 2013-02-14 LAB — CBC
HCT: 37.8 % (ref 36.0–46.0)
Hemoglobin: 12.5 g/dL (ref 12.0–15.0)
MCH: 30.1 pg (ref 26.0–34.0)
MCHC: 33.1 g/dL (ref 30.0–36.0)
MCV: 91.1 fL (ref 78.0–100.0)
PLATELETS: 227 10*3/uL (ref 150–400)
RBC: 4.15 MIL/uL (ref 3.87–5.11)
RDW: 14.5 % (ref 11.5–15.5)
WBC: 21 10*3/uL — AB (ref 4.0–10.5)

## 2013-02-14 LAB — HEMOGLOBIN A1C
HEMOGLOBIN A1C: 6.3 % — AB (ref <5.7)
MEAN PLASMA GLUCOSE: 134 mg/dL — AB (ref <117)

## 2013-02-14 MED ORDER — POTASSIUM CHLORIDE ER 10 MEQ PO TBCR: 20.0000 meq | EXTENDED_RELEASE_TABLET | Freq: Every day | ORAL | Status: DC

## 2013-02-14 MED ORDER — LORAZEPAM 0.5 MG PO TABS
0.5000 mg | ORAL_TABLET | Freq: Four times a day (QID) | ORAL | Status: DC | PRN
  Administered 2013-02-14: 0.5 mg via ORAL
  Filled 2013-02-14: qty 1

## 2013-02-14 MED ORDER — SENNOSIDES-DOCUSATE SODIUM 8.6-50 MG PO TABS
2.0000 | ORAL_TABLET | Freq: Every evening | ORAL | Status: DC | PRN
  Administered 2013-02-14 – 2013-02-15 (×2): 2 via ORAL
  Filled 2013-02-14 (×2): qty 2

## 2013-02-14 MED ORDER — ASPIRIN EC 81 MG PO TBEC
81.0000 mg | DELAYED_RELEASE_TABLET | Freq: Every day | ORAL | Status: DC
  Administered 2013-02-14 – 2013-02-19 (×6): 81 mg via ORAL
  Filled 2013-02-14 (×6): qty 1

## 2013-02-14 MED ORDER — HALOPERIDOL LACTATE 5 MG/ML IJ SOLN
1.0000 mg | Freq: Four times a day (QID) | INTRAMUSCULAR | Status: DC | PRN
  Administered 2013-02-14 – 2013-02-17 (×7): 1 mg via INTRAVENOUS
  Administered 2013-02-18: 21:00:00 2 mg via INTRAVENOUS
  Filled 2013-02-14 (×8): qty 1

## 2013-02-14 MED ORDER — RISPERIDONE 0.25 MG PO TABS
0.2500 mg | ORAL_TABLET | Freq: Every day | ORAL | Status: DC
  Administered 2013-02-14 – 2013-02-18 (×5): 0.25 mg via ORAL
  Filled 2013-02-14 (×6): qty 1

## 2013-02-14 MED ORDER — WARFARIN SODIUM 5 MG PO TABS
5.0000 mg | ORAL_TABLET | Freq: Once | ORAL | Status: AC
  Administered 2013-02-14: 17:00:00 5 mg via ORAL
  Filled 2013-02-14: qty 1

## 2013-02-14 MED ORDER — PREDNISONE 50 MG PO TABS
60.0000 mg | ORAL_TABLET | Freq: Every day | ORAL | Status: DC
  Administered 2013-02-14 – 2013-02-17 (×4): 60 mg via ORAL
  Filled 2013-02-14 (×5): qty 1

## 2013-02-14 MED ORDER — LEVOFLOXACIN IN D5W 750 MG/150ML IV SOLN
750.0000 mg | INTRAVENOUS | Status: AC
  Administered 2013-02-14 – 2013-02-16 (×3): 750 mg via INTRAVENOUS
  Filled 2013-02-14 (×3): qty 150

## 2013-02-14 MED ORDER — FUROSEMIDE 10 MG/ML IJ SOLN
80.0000 mg | Freq: Two times a day (BID) | INTRAMUSCULAR | Status: DC
Start: 2013-02-14 — End: 2013-02-16
  Administered 2013-02-14 – 2013-02-15 (×4): 80 mg via INTRAVENOUS
  Filled 2013-02-14 (×8): qty 8

## 2013-02-14 NOTE — Progress Notes (Signed)
[place] for Levaquin x 3d Indication: pneumonia  Pharmacy Consult for Coumadin Indication: atrial fibrillation  Allergies  Allergen Reactions  . Ceclor [Cefaclor] Other (See Comments)    Reaction unknown  . Elastic Bandages & [Zinc] Other (See Comments)    Turns red on the areas it touches  . Lipitor [Atorvastatin Calcium] Other (See Comments)    Increased fibromyalgia pain  . Mevacor [Lovastatin] Other (See Comments)    Increased fibromyalgia pain  . Pravachol Other (See Comments)    Increased fibromyalgia pain  . Vasotec Other (See Comments)    Reaction unknown  . Adhesive [Tape] Itching and Rash    Burning   . Latex Itching and Rash    Burning    Labs:  Recent Labs  02/12/13 2020 02/13/13 0249 02/14/13 0210  WBC 13.4* 16.0* 21.0*  HGB 13.8 11.8* 12.5  HCT 41.3 36.7 37.8  PLT 248 219 227  CREATININE 0.69 0.71 0.71  ALBUMIN  --  3.5  --   PROT  --  6.6  --   AST  --  34  --   ALT  --  20  --   ALKPHOS  --  45  --   BILITOT  --  0.5  --    Assessment: 64 YOF admitted 1/3 with SOB, CXR with early infiltrate/PNA at the L base. Also with components of AECOPD. Ceftriaxone was initially started, then narrowed to azithro given positive strep pneumo urinary ag. MD noted pt was on azithromycin outpatient without improvement so change abx to Levaquin x 3days. Pt with cephalosporins allergy.   1/3 Ceftriaxone x 1  1/4 Azithro x 1  1/5 >> Levaquin >>  Tmax: afeb  WBCs: 21K, trending up (on prednisone) Scr 0.71, CG 60 ml/min  1/3 blood x 2 >> pending 1/4 sputum >> ordered, not collected 1/4 legionella Ag >> pending 1/4 S. pneumoniae Ag >> positive 1/4 influenza PCR pending 1/5 urine >> pending   Assessment:  67 yoF on Coumadin PTA for h/o afib. INR on admission subtherapeutic at 1.53. Pt found in afib with RVR and mildly elevated troponin. Cardiology thinks likely demand ischemia in the setting of afib with RVR and volume overload  - plan for outpatient stress test. Coumadin, dig, metoprolol.  PTA Coumadin dose = 5mg  T/T/Sat, 7.5mg  other days, LD 1/2. Drug-drug interaction noted with Levaquin  INR therapeutic today at 2.17, CBC wnl, no issues. Levaquin will be continued for 3 days.   Goal of Therapy:  INR 2-3 Appropriate renal adjustments of antibiotics  Plan:   Levaquin 750 mg IV q24h x 3 doses. Pharmacy will sign off from consult. Will f/u peripherally  Coumadin 5mg  po x 1 tonight given DDI and acute illness  Daily PT/INR  Vanessa [place], PharmD, BCPS Pager: [phone] 8:26 AM Pharmacy #: [id number]

## 2013-02-14 NOTE — Progress Notes (Signed)
CRITICAL VALUE ALERT  Critical value received:  Troponin 0.52  Date of notification:  02/15/12  Time of notification: 0310  Critical value read back:yes  Nurse who received alert:  Darrin Nipper, RN  MD notified (1st page): Reidler   Time of first page: 0310  MD notified (2nd page):  Time of second page:  Responding MD: Reidler  Time MD responded:

## 2013-02-14 NOTE — Progress Notes (Signed)
Patient ID: Amber [name], female   DOB: 12-26-1934, 79 y.o.   MRN: [id number]    SUBJECTIVE: Delirium with confusion overnight, more clear this morning.  Still with dyspnea.  HR controlled.   [name] allopurinol  300 mg Oral QPM  . amitriptyline  50 mg Oral QHS  . amLODipine  5 mg Oral QPM  . citalopram  20 mg Oral BID  . clopidogrel  75 mg Oral Q breakfast  . digoxin  0.125 mg Oral QPM  . donepezil  5 mg Oral Daily  . furosemide  80 mg Intravenous BID  . guaiFENesin  600 mg Oral BID  . ipratropium  0.5 mg Nebulization Q6H  . irbesartan  75 mg Oral Daily  . levofloxacin (LEVAQUIN) IV  750 mg Intravenous Q24H  . levothyroxine  25 mcg Oral QAC breakfast  . metoprolol tartrate  25 mg Oral BID  . pantoprazole  40 mg Oral Q1200  . potassium chloride SA  20 mEq Oral Daily  . predniSONE  60 mg Oral Q breakfast  . risperiDONE  0.25 mg Oral QHS  . sodium chloride  3 mL Intravenous Q12H  . Warfarin - Pharmacist Dosing Inpatient   Does not apply q1800     Filed Vitals:   02/14/13 0200 02/14/13 0300 02/14/13 0400 02/14/13 0500  BP: 146/44 140/104 123/44 145/112  Pulse: 65   74  Temp:      TempSrc:      Resp: 17 21 21 19   Height:      Weight:      SpO2: 100%   98%    Intake/Output Summary (Last 24 hours) at 02/14/13 0701 Last data filed at 02/13/13 1947  Gross per 24 hour  Intake   1090 ml  Output      0 ml  Net   1090 ml    LABS: Basic Metabolic Panel:  Recent Labs  02/13/13 0249 02/14/13 0210  NA 132* 134*  K 3.6* 4.5  CL 91* 96  CO2 22 25  GLUCOSE 253* 176*  BUN 12 22  CREATININE 0.71 0.71  CALCIUM 9.1 9.4   Liver Function Tests:  Recent Labs  02/13/13 0249  AST 34  ALT 20  ALKPHOS 45  BILITOT 0.5  PROT 6.6  ALBUMIN 3.5   No results found for this basename: LIPASE, AMYLASE,  in the last 72 hours CBC:  Recent Labs  02/12/13 2020 02/13/13 0249 02/14/13 0210  WBC 13.4* 16.0* 21.0*  NEUTROABS 11.6* 15.1*  --   HGB 13.8 11.8* 12.5  HCT 41.3 36.7 37.8   MCV 92.0 91.5 91.1  PLT 248 219 227   Cardiac Enzymes:  Recent Labs  02/13/13 1402 02/13/13 2002 02/14/13 0210  TROPONINI 0.47* 0.50* 0.52*   BNP: No components found with this basename: POCBNP,  D-Dimer: No results found for this basename: DDIMER,  in the last 72 hours Hemoglobin A1C: No results found for this basename: HGBA1C,  in the last 72 hours Fasting Lipid Panel: No results found for this basename: CHOL, HDL, LDLCALC, TRIG, CHOLHDL, LDLDIRECT,  in the last 72 hours Thyroid Function Tests: No results found for this basename: TSH, T4TOTAL, FREET3, T3FREE, THYROIDAB,  in the last 72 hours Anemia Panel: No results found for this basename: VITAMINB12, FOLATE, FERRITIN, TIBC, IRON, RETICCTPCT,  in the last 72 hours  RADIOLOGY: Dg Chest 2 View  02/12/2013   CLINICAL DATA:  Influenza 08/06/2012  EXAM: CHEST  2 VIEW  COMPARISON:  None.  FINDINGS:  No cardiomegaly. Status post CABG. Asymmetric reticular nodular opacity at the left base. No pleural effusion. No pulmonary edema.  T12 and L1 compression fractures, with questionable progression of height loss at the T12 fracture since 10/29/2012 abdominal CT  IMPRESSION: Early infiltrate/pneumonia at the left base.   Electronically Signed   By: Jorje Guild M.D.   On: 02/12/2013 22:36    PHYSICAL EXAM General: NAD Neck: JVP 10-12 cm, no thyromegaly or thyroid nodule.  Lungs: Coarse BS bilaterally CV: Nondisplaced PMI.  Heart irregular S1/S2, no S3/S4, no murmur.  No peripheral edema.   Abdomen: Soft, nontender, no hepatosplenomegaly, no distention.  Neurologic: Alert and oriented x 3.  Psych: Normal affect. Extremities: No clubbing or cyanosis.   TELEMETRY: Reviewed telemetry pt in atrial fibrillation, HR 70s  ASSESSMENT AND PLAN: 79 yo with history of COPD, chronic atrial fibrillation, and CAD s/p CABG presented with LLL PNA, probable COPD exacerbation, and atrial fibrillation with RVR. 1. PNA: Pneumococcal PNA with probable  co-existing COPD exacerbation.  Getting abx + prednisone.  2. CHF: Acute on chronic diastolic CHF.  She is volume overloaded on exam this morning which is likely worsening her dyspnea.   - Will transition her from 80 mg po bid Lasix to 80 mg IV bid Lasix for a day or two.  Follow K and creatinine.  3. Atrial fibrillation: Chronic, HR now controlled on metoprolol and digoxin.  She is on warfarin.  Check digoxin level in am.  4. Elevated troponin: Agree with Dr. Wynonia Lawman that this is likely demand ischemia in setting of atrial fibrillation with RVR and volume overload.  Will likely plan for outpatient stress when she has recovered.   [name] 02/14/2013 7:05 AM

## 2013-02-14 NOTE — Progress Notes (Signed)
[facility] PROGRESS NOTE  Amber [name] IRC:[id number] DOB: 1934-02-13 DOA: 02/12/2013 PCP: Gara Kroner, MD  Assessment/Plan  A-fib with RVR, resolved.  Rate currently 60s-70s.   -  Continue coumadin and dig and metoprolol  NSTEMI, likely type 2, chest pain free, ECG demonstrates some deeper ST segment depressions in V1 through V5. -  Cycle troponins until stable or trending down -  Appreciate Cardiology assistance -  ECHO:  EF 55-60% with normal wall motion.  LA mod dilated, RA mil to mod dilation, PA peak pressure 67mm Hg  COPD exacerbation, improving -  Transition to prednisone 60mg  daily  -  Continue bronchodilators -  Continue antibiotics  Strep pneumo pneumonia, failed outpatient azithro -  Restart levofloxacin  Hospital acquired delirium and possibly steroid-induced psychosis with hallucinations -  Start haldol prn with risperidone qhs -  Frequent reorientation -  Wean steroids as quickly as possible -  Sitter if family not present  Dementia/depression, stable. Continue aricept and celexa  HTN, blood pressure wnl (low diastolic) -  Continue norvasc and ARB  Diastolic heart failure, with increased rales on exam.   -  Repeat CXR -  Check pro-BNP -  Continue lasix BID and give additional IV lasix if evidence of diastolic heart failure  Hypothyroid, stable.  Continue synthroid  Diet:  Healthy heart Access:  PIV IVF:  off Proph:  Coumadin   Code Status: full Family Communication: patient and daughter Disposition Plan: transfer to telemetry   Consultants:  Cardiology  Procedures:  CXR  Antibiotics:  Ceftriaxone 1/4x1  Azithromycin 1/4  Levofloxacin 1/4x1   HPI/Subjective:  Patient continues to have cough, SOB.  Denies cp, nausea, dysuria.  Very disoriented and agitated overnight with some hallucinations.    Objective: Filed Vitals:   02/14/13 0200 02/14/13 0300 02/14/13 0400 02/14/13 0500  BP: 146/44 140/104 123/44 145/112  Pulse:  65   74  Temp:      TempSrc:      Resp: 17 21 21 19   Height:      Weight:      SpO2: 100%   98%    Intake/Output Summary (Last 24 hours) at 02/14/13 0701 Last data filed at 02/13/13 1947  Gross per 24 hour  Intake   1090 ml  Output      0 ml  Net   1090 ml   Filed Weights   02/13/13 0132  Weight: 75.8 kg (167 lb 1.7 oz)    Exam:   General:  Caucasian female, No acute distress, very confused  HEENT:  NCAT, MMM  Cardiovascular:  IRRR, nl S1, S2 no mrg, 2+ pulses, warm extremities  Respiratory:  Diminished bilateral breath sounds, full exp wheeze, rales at bilateral bases and rhonchorous.  No increased WOB  Abdomen:   NABS, soft, NT/ND  MSK:   Normal tone and bulk, no LEE  Neuro:  Grossly intact  Data Reviewed: Basic Metabolic Panel:  Recent Labs Lab 02/12/13 2020 02/13/13 0249 02/14/13 0210  NA 137 132* 134*  K 3.6* 3.6* 4.5  CL 92* 91* 96  CO2 28 22 25   GLUCOSE 139* 253* 176*  BUN 11 12 22   CREATININE 0.69 0.71 0.71  CALCIUM 10.0 9.1 9.4   Liver Function Tests:  Recent Labs Lab 02/13/13 0249  AST 34  ALT 20  ALKPHOS 45  BILITOT 0.5  PROT 6.6  ALBUMIN 3.5   No results found for this basename: LIPASE, AMYLASE,  in the last 168 hours No results found  for this basename: AMMONIA,  in the last 168 hours CBC:  Recent Labs Lab 02/12/13 2020 02/13/13 0249 02/14/13 0210  WBC 13.4* 16.0* 21.0*  NEUTROABS 11.6* 15.1*  --   HGB 13.8 11.8* 12.5  HCT 41.3 36.7 37.8  MCV 92.0 91.5 91.1  PLT 248 219 227   Cardiac Enzymes:  Recent Labs Lab 02/13/13 0249 02/13/13 0738 02/13/13 1402 02/13/13 2002 02/14/13 0210  TROPONINI <0.30 0.51* 0.47* 0.50* 0.52*   BNP (last 3 results)  Recent Labs  02/12/13 2020  PROBNP 1485.0*   CBG: No results found for this basename: GLUCAP,  in the last 168 hours  Recent Results (from the past 240 hour(s))  MRSA PCR SCREENING     Status: None   Collection Time    02/13/13  1:01 AM      Result Value Range  Status   MRSA by PCR NEGATIVE  NEGATIVE Final   Comment:            The GeneXpert MRSA Assay (FDA     approved for NASAL specimens     only), is one component of a     comprehensive MRSA colonization     surveillance program. It is not     intended to diagnose MRSA     infection nor to guide or     monitor treatment for     MRSA infections.     Studies: Dg Chest 2 View  02/12/2013   CLINICAL DATA:  Influenza 08/06/2012  EXAM: CHEST  2 VIEW  COMPARISON:  None.  FINDINGS: No cardiomegaly. Status post CABG. Asymmetric reticular nodular opacity at the left base. No pleural effusion. No pulmonary edema.  T12 and L1 compression fractures, with questionable progression of height loss at the T12 fracture since 10/29/2012 abdominal CT  IMPRESSION: Early infiltrate/pneumonia at the left base.   Electronically Signed   By: Jorje Guild M.D.   On: 02/12/2013 22:36    Scheduled Meds: . allopurinol  300 mg Oral QPM  . amitriptyline  50 mg Oral QHS  . amLODipine  5 mg Oral QPM  . citalopram  20 mg Oral BID  . clopidogrel  75 mg Oral Q breakfast  . digoxin  0.125 mg Oral QPM  . donepezil  5 mg Oral Daily  . furosemide  80 mg Intravenous BID  . guaiFENesin  600 mg Oral BID  . ipratropium  0.5 mg Nebulization Q6H  . irbesartan  75 mg Oral Daily  . levofloxacin (LEVAQUIN) IV  750 mg Intravenous Q24H  . levothyroxine  25 mcg Oral QAC breakfast  . metoprolol tartrate  25 mg Oral BID  . pantoprazole  40 mg Oral Q1200  . potassium chloride SA  20 mEq Oral Daily  . predniSONE  60 mg Oral Q breakfast  . risperiDONE  0.25 mg Oral QHS  . sodium chloride  3 mL Intravenous Q12H  . Warfarin - Pharmacist Dosing Inpatient   Does not apply q1800   Continuous Infusions:   Active Problems:   Atrial fibrillation   Hypothyroidism   COPD (chronic obstructive pulmonary disease)   Diastolic heart failure secondary to hypertension   CAP (community acquired pneumonia)   Long-term (current) use of  anticoagulants    Time spent: 30 min    Zaki Gertsch, [facility] Pager [phone]. If 7PM-7AM, please contact night-coverage at [email or web address], password [facility] 02/14/2013, 7:01 AM  LOS: 2 days

## 2013-02-14 NOTE — Progress Notes (Signed)
CARE MANAGEMENT NOTE 02/14/2013  Patient:  Amber [name], Amber [name]   Account Number:  [id number]  Date Initiated:  02/14/2013  Documentation initiated by:  DAVIS,RHONDA  Subjective/Objective Assessment:   pt with multiple med problems and confirmed nstemi.  A,fib with rvr nnow controlled.     Action/Plan:   lives at home   Anticipated DC Date:  02/17/2013   Anticipated DC Plan:  HOME/SELF CARE         Choice offered to / List presented to:             Status of service:  In process, will continue to follow Medicare Important Message given?   (If response is "NO", the following Medicare IM given date fields will be blank) Date Medicare IM given:   Date Additional Medicare IM given:    Discharge Disposition:    Per UR Regulation:  Reviewed for med. necessity/level of care/duration of stay  If discussed at [place] of Stay Meetings, dates discussed:    Comments:  [id number]/Rhonda Eldridge Dace, BSN, [place] [phone] Chart Reviewed for discharge and hospital needs. Discharge needs at time of review:  None present will follow for needs. Review of patient progress due on [id number].

## 2013-02-15 DIAGNOSIS — D649 Anemia, unspecified: Secondary | ICD-10-CM

## 2013-02-15 LAB — URINE CULTURE
COLONY COUNT: NO GROWTH
CULTURE: NO GROWTH

## 2013-02-15 LAB — BASIC METABOLIC PANEL
BUN: 27 mg/dL — AB (ref 6–23)
CALCIUM: 9 mg/dL (ref 8.4–10.5)
CO2: 23 mEq/L (ref 19–32)
CREATININE: 0.85 mg/dL (ref 0.50–1.10)
Chloride: 100 mEq/L (ref 96–112)
GFR calc non Af Amer: 65 mL/min — ABNORMAL LOW (ref >90)
GFR, EST AFRICAN AMERICAN: 75 mL/min — AB (ref >90)
Glucose, Bld: 141 mg/dL — ABNORMAL HIGH (ref 70–99)
Potassium: 4.2 mEq/L (ref 3.7–5.3)
Sodium: 136 mEq/L — ABNORMAL LOW (ref 137–147)

## 2013-02-15 LAB — CBC
HEMATOCRIT: 37.4 % (ref 36.0–46.0)
Hemoglobin: 12.5 g/dL (ref 12.0–15.0)
MCH: 30.5 pg (ref 26.0–34.0)
MCHC: 33.4 g/dL (ref 30.0–36.0)
MCV: 91.2 fL (ref 78.0–100.0)
PLATELETS: 256 10*3/uL (ref 150–400)
RBC: 4.1 MIL/uL (ref 3.87–5.11)
RDW: 14.7 % (ref 11.5–15.5)
WBC: 14.1 10*3/uL — ABNORMAL HIGH (ref 4.0–10.5)

## 2013-02-15 LAB — PROTIME-INR
INR: 2.62 — ABNORMAL HIGH (ref 0.00–1.49)
Prothrombin Time: 27.1 seconds — ABNORMAL HIGH (ref 11.6–15.2)

## 2013-02-15 LAB — GLUCOSE, CAPILLARY: Glucose-Capillary: 123 mg/dL — ABNORMAL HIGH (ref 70–99)

## 2013-02-15 LAB — DIGOXIN LEVEL: DIGOXIN LVL: 0.9 ng/mL (ref 0.8–2.0)

## 2013-02-15 MED ORDER — METOPROLOL TARTRATE 12.5 MG HALF TABLET
12.5000 mg | ORAL_TABLET | Freq: Two times a day (BID) | ORAL | Status: DC
  Administered 2013-02-15: 23:00:00 via ORAL
  Administered 2013-02-15 – 2013-02-19 (×8): 12.5 mg via ORAL
  Filled 2013-02-15 (×11): qty 1

## 2013-02-15 MED ORDER — DOCUSATE SODIUM 100 MG PO CAPS
100.0000 mg | ORAL_CAPSULE | Freq: Two times a day (BID) | ORAL | Status: DC
  Administered 2013-02-15 – 2013-02-19 (×8): 100 mg via ORAL
  Filled 2013-02-15 (×9): qty 1

## 2013-02-15 MED ORDER — POLYETHYLENE GLYCOL 3350 17 G PO PACK
17.0000 g | PACK | Freq: Every day | ORAL | Status: DC
  Administered 2013-02-15 – 2013-02-19 (×3): 17 g via ORAL
  Filled 2013-02-15 (×5): qty 1

## 2013-02-15 MED ORDER — BISACODYL 10 MG RE SUPP: 10.0000 mg | Freq: Every day | RECTAL | Status: DC | PRN

## 2013-02-15 MED ORDER — WARFARIN SODIUM 2.5 MG PO TABS
2.5000 mg | ORAL_TABLET | Freq: Once | ORAL | Status: AC
  Administered 2013-02-15: 2.5 mg via ORAL
  Filled 2013-02-15: qty 1

## 2013-02-15 NOTE — Progress Notes (Signed)
Patient ID: Amber [name], female   DOB: 03-16-1934, 79 y.o.   MRN: [id number]   SUBJECTIVE: Does not seem confused this morning.  Diuresed well yesterday.    [name] allopurinol  300 mg Oral QPM  . amitriptyline  50 mg Oral QHS  . amLODipine  5 mg Oral QPM  . aspirin EC  81 mg Oral Daily  . citalopram  20 mg Oral BID  . clopidogrel  75 mg Oral Q breakfast  . digoxin  0.125 mg Oral QPM  . donepezil  5 mg Oral Daily  . furosemide  80 mg Intravenous BID  . guaiFENesin  600 mg Oral BID  . ipratropium  0.5 mg Nebulization Q6H  . irbesartan  75 mg Oral Daily  . levofloxacin (LEVAQUIN) IV  750 mg Intravenous Q24H  . levothyroxine  25 mcg Oral QAC breakfast  . metoprolol tartrate  25 mg Oral BID  . pantoprazole  40 mg Oral Q1200  . potassium chloride SA  20 mEq Oral Daily  . predniSONE  60 mg Oral Q breakfast  . risperiDONE  0.25 mg Oral QHS  . sodium chloride  3 mL Intravenous Q12H  . Warfarin - Pharmacist Dosing Inpatient   Does not apply q1800     Filed Vitals:   02/14/13 2225 02/14/13 2233 02/14/13 2342 02/15/13 0600  BP: 142/59   133/63  Pulse: 59  73 65  Temp: 97.4 F (36.3 C)   97.4 F (36.3 C)  TempSrc: Oral   Oral  Resp: 16   16  Height:      Weight:      SpO2: 99% 99%  99%    Intake/Output Summary (Last 24 hours) at 02/15/13 0741 Last data filed at 02/15/13 0700  Gross per 24 hour  Intake    150 ml  Output   2150 ml  Net  -2000 ml    LABS: Basic Metabolic Panel:  Recent Labs  02/14/13 0210 02/15/13 0601  NA 134* 136*  K 4.5 4.2  CL 96 100  CO2 25 23  GLUCOSE 176* 141*  BUN 22 27*  CREATININE 0.71 0.85  CALCIUM 9.4 9.0   Liver Function Tests:  Recent Labs  02/13/13 0249  AST 34  ALT 20  ALKPHOS 45  BILITOT 0.5  PROT 6.6  ALBUMIN 3.5   No results found for this basename: LIPASE, AMYLASE,  in the last 72 hours CBC:  Recent Labs  02/12/13 2020 02/13/13 0249 02/14/13 0210 02/15/13 0601  WBC 13.4* 16.0* 21.0* 14.1*  NEUTROABS 11.6* 15.1*   --   --   HGB 13.8 11.8* 12.5 12.5  HCT 41.3 36.7 37.8 37.4  MCV 92.0 91.5 91.1 91.2  PLT 248 219 227 256   Cardiac Enzymes:  Recent Labs  02/14/13 0210 02/14/13 0806 02/14/13 1425  TROPONINI 0.52* 0.41* 0.36*   BNP: No components found with this basename: POCBNP,  D-Dimer: No results found for this basename: DDIMER,  in the last 72 hours Hemoglobin A1C:  Recent Labs  02/14/13 0210  HGBA1C 6.3*   Fasting Lipid Panel: No results found for this basename: CHOL, HDL, LDLCALC, TRIG, CHOLHDL, LDLDIRECT,  in the last 72 hours Thyroid Function Tests: No results found for this basename: TSH, T4TOTAL, FREET3, T3FREE, THYROIDAB,  in the last 72 hours Anemia Panel: No results found for this basename: VITAMINB12, FOLATE, FERRITIN, TIBC, IRON, RETICCTPCT,  in the last 72 hours  RADIOLOGY: Dg Chest 2 View  02/12/2013   CLINICAL DATA:  Influenza 08/06/2012  EXAM: CHEST  2 VIEW  COMPARISON:  None.  FINDINGS: No cardiomegaly. Status post CABG. Asymmetric reticular nodular opacity at the left base. No pleural effusion. No pulmonary edema.  T12 and L1 compression fractures, with questionable progression of height loss at the T12 fracture since 10/29/2012 abdominal CT  IMPRESSION: Early infiltrate/pneumonia at the left base.   Electronically Signed   By: Jorje Guild M.D.   On: 02/12/2013 22:36    PHYSICAL EXAM General: NAD Neck: JVP 10-12 cm, no thyromegaly or thyroid nodule.  Lungs: Coarse BS bilaterally CV: Nondisplaced PMI.  Heart irregular S1/S2, no S3/S4, no murmur.  No peripheral edema.   Abdomen: Soft, nontender, no hepatosplenomegaly, no distention.  Neurologic: Alert and oriented x 3.  Psych: Normal affect. Extremities: No clubbing or cyanosis.   TELEMETRY: Reviewed telemetry pt in atrial fibrillation, HR 70s  ASSESSMENT AND PLAN: 79 yo with history of COPD, chronic atrial fibrillation, and CAD s/p CABG presented with LLL PNA, probable COPD exacerbation, and atrial  fibrillation with RVR. 1. PNA: Pneumococcal PNA with probable co-existing COPD exacerbation.  Getting abx + prednisone.  2. CHF: Acute on chronic diastolic CHF.  She diuresed reasonably well yesterday but remains volume overloaded on exam - Continue current IV Lasix.  Follow K and creatinine.  3. Atrial fibrillation: Chronic, HR now controlled on metoprolol and digoxin.  She is on warfarin.  Digoxin level ok. 4. Elevated troponin: Agree with Dr. Wynonia Lawman that this is likely demand ischemia in setting of atrial fibrillation with RVR and volume overload.  Will likely plan for outpatient stress when she has recovered.   Amber [name] 02/15/2013 7:41 AM

## 2013-02-15 NOTE — Progress Notes (Signed)
Dr. Aundra Dubin notified of patient having several pauses on monitor. Longest pause recorded was 2.29 seconds.VS stable and patient does not have symptoms during this time. Changes were made to metroprolol 25 mg BIDto 12.5 BID per MD. Will continue to closely monitor patient. Setzer, Marchelle Folks

## 2013-02-15 NOTE — Progress Notes (Addendum)
[facility] PROGRESS NOTE  Amber [name] [id number] DOB: [birth year] DOA: 02/12/2013 PCP: Gara Kroner, MD  Brief summary  79 yo F with hx of CAD, HTN, HLD, CHF, A-fib, hypothyroidism, COPD, p/w SOB, and pneumonia not responding to azithromycin.  Found to have S. Pneumonia pneumonia, treating with levofloxacin (has cephalosporin allergy).  Weaning steroids quickly for COPD because of delirium with possible steroid-induced psychosis.  Had a-fib with RVR at presentation and HR rapidly improved, but troponins rose, likely demand ischemia/NSTEMI type 2. Cardiology is following.    Assessment/Plan  A-fib with RVR, resolved.  Rate currently 60s-70s with pauses up to 2.3 seconds.   -  Continue coumadin -  Continue dig -  Metoprolol reduced by cardiology today  NSTEMI, likely type 2, chest pain free, ECG demonstrates some deeper ST segment depressions in V1 through V5. -  troponins trended down to 0.36 today.  Okay to stop checking -  Appreciate Cardiology assistance -  ECHO:  EF 55-60% with normal wall motion.  LA mod dilated, RA mil to mod dilation, PA peak pressure 75mm Hg  COPD exacerbation, improving -  Continue prednisone 60mg  daily  -  Continue bronchodilators -  Continue antibiotics  Strep pneumo pneumonia, failed outpatient azithro -  Continue levofloxacin -  BCx NGTD  Hospital acquired delirium and possibly steroid-induced psychosis with hallucinations, improving. -  Continue haldol prn with risperidone qhs -  Frequent reorientation -  Wean steroids as quickly as possible -  Sitter if family not present  Dementia/depression, stable. Continue aricept and celexa  HTN, blood pressure wnl (low diastolic) -  Continue norvasc and ARB  Diastolic heart failure, improving.  -  Management per cardiology  Hypothyroid, stable.  Continue synthroid  Diet:  Healthy heart Access:  PIV IVF:  off Proph:  Coumadin   Code Status: full Family Communication: patient and  daughter Disposition Plan:  Continue telemetry   Consultants:  Cardiology  Procedures:  CXR  Antibiotics:  Ceftriaxone 1/4x1  Azithromycin 1/4  Levofloxacin 1/4x1   HPI/Subjective:  Patient continues to have cough, SOB, but improving.  Less confused and fewer hallucinations last night  Objective: Filed Vitals:   02/15/13 1050 02/15/13 1400 02/15/13 1741 02/15/13 1836  BP:  148/56 151/63 146/54  Pulse:  73  64  Temp:  97.5 F (36.4 C)  97.1 F (36.2 C)  TempSrc:  Oral  Oral  Resp:  16  18  Height:      Weight:      SpO2: 97% 96%  96%    Intake/Output Summary (Last 24 hours) at 02/15/13 1958 Last data filed at 02/15/13 1830  Gross per 24 hour  Intake    150 ml  Output   2651 ml  Net  -2501 ml   Filed Weights   02/13/13 0132  Weight: 75.8 kg (167 lb 1.7 oz)    Exam:   General:  Caucasian female, No acute distress, less confused  HEENT:  NCAT, MMM  Cardiovascular:  IRRR, nl S1, S2 no mrg, 2+ pulses, warm extremities  Respiratory:  Diminished bilateral breath sounds, full exp wheeze, rhonchorous, rales have cleared somewhat.  No increased WOB  Abdomen:   NABS, soft, NT/ND  MSK:   Normal tone and bulk, no LEE  Neuro:  Grossly intact  Data Reviewed: Basic Metabolic Panel:  Recent Labs Lab 02/12/13 2020 02/13/13 0249 02/14/13 0210 02/15/13 0601  NA 137 132* 134* 136*  K 3.6* 3.6* 4.5 4.2  CL 92* 91* 96 100  CO2 28 22 25 23   GLUCOSE 139* 253* 176* 141*  BUN 11 12 22  27*  CREATININE 0.69 0.71 0.71 0.85  CALCIUM 10.0 9.1 9.4 9.0   Liver Function Tests:  Recent Labs Lab 02/13/13 0249  AST 34  ALT 20  ALKPHOS 45  BILITOT 0.5  PROT 6.6  ALBUMIN 3.5   No results found for this basename: LIPASE, AMYLASE,  in the last 168 hours No results found for this basename: AMMONIA,  in the last 168 hours CBC:  Recent Labs Lab 02/12/13 2020 02/13/13 0249 02/14/13 0210 02/15/13 0601  WBC 13.4* 16.0* 21.0* 14.1*  NEUTROABS 11.6* 15.1*  --    --   HGB 13.8 11.8* 12.5 12.5  HCT 41.3 36.7 37.8 37.4  MCV 92.0 91.5 91.1 91.2  PLT 248 219 227 256   Cardiac Enzymes:  Recent Labs Lab 02/13/13 1402 02/13/13 2002 02/14/13 0210 02/14/13 0806 02/14/13 1425  TROPONINI 0.47* 0.50* 0.52* 0.41* 0.36*   BNP (last 3 results)  Recent Labs  02/12/13 2020 02/14/13 0806  PROBNP 1485.0* 5472.0*   CBG:  Recent Labs Lab 02/14/13 1550  GLUCAP 123*    Recent Results (from the past 240 hour(s))  CULTURE, BLOOD (ROUTINE X 2)     Status: None   Collection Time    02/12/13  7:43 PM      Result Value Range Status   Specimen Description BLOOD RIGHT ARM   Final   Special Requests BOTTLES DRAWN AEROBIC AND ANAEROBIC 5CC   Final   Culture  Setup Time     Final   Value: 02/13/2013 01:27     Performed at [facility]   Culture     Final   Value:        BLOOD CULTURE RECEIVED NO GROWTH TO DATE CULTURE WILL BE HELD FOR 5 DAYS BEFORE ISSUING A FINAL NEGATIVE REPORT     Performed at [facility]   Report Status PENDING   Incomplete  CULTURE, BLOOD (ROUTINE X 2)     Status: None   Collection Time    02/12/13  8:20 PM      Result Value Range Status   Specimen Description BLOOD RIGHT HAND   Final   Special Requests BOTTLES DRAWN AEROBIC AND ANAEROBIC 5CC   Final   Culture  Setup Time     Final   Value: 02/13/2013 01:26     Performed at [facility]   Culture     Final   Value:        BLOOD CULTURE RECEIVED NO GROWTH TO DATE CULTURE WILL BE HELD FOR 5 DAYS BEFORE ISSUING A FINAL NEGATIVE REPORT     Performed at [facility]   Report Status PENDING   Incomplete  MRSA PCR SCREENING     Status: None   Collection Time    02/13/13  1:01 AM      Result Value Range Status   MRSA by PCR NEGATIVE  NEGATIVE Final   Comment:            The GeneXpert MRSA Assay (FDA     approved for NASAL specimens     only), is one component of a     comprehensive MRSA colonization     surveillance program. It is not      intended to diagnose MRSA     infection nor to guide or     monitor treatment for     MRSA infections.  URINE CULTURE     Status: None   Collection Time    02/14/13  6:53 AM      Result Value Range Status   Specimen Description URINE, RANDOM   Final   Special Requests NONE   Final   Culture  Setup Time     Final   Value: 02/14/2013 09:03     Performed at [facility]     Final   Value: NO GROWTH     Performed at [facility]   Culture     Final   Value: NO GROWTH     Performed at [facility]   Report Status 02/15/2013 FINAL   Final     Studies: Dg Chest Port 1 View  02/14/2013   CLINICAL DATA:  Shortness of breath  EXAM: PORTABLE CHEST - 1 VIEW  COMPARISON:  02/12/2013  FINDINGS: Cardiac shadow is stable. Postsurgical changes are again noted. There are stable changes in the left lung base. No new focal infiltrate is seen.  IMPRESSION: Stable changes in the left base.  Continued followup is recommended.   Electronically Signed   By: Inez Catalina M.D.   On: 02/14/2013 08:00    Scheduled Meds: . allopurinol  300 mg Oral QPM  . amitriptyline  50 mg Oral QHS  . amLODipine  5 mg Oral QPM  . aspirin EC  81 mg Oral Daily  . citalopram  20 mg Oral BID  . clopidogrel  75 mg Oral Q breakfast  . digoxin  0.125 mg Oral QPM  . docusate sodium  100 mg Oral BID  . donepezil  5 mg Oral Daily  . furosemide  80 mg Intravenous BID  . guaiFENesin  600 mg Oral BID  . ipratropium  0.5 mg Nebulization Q6H  . irbesartan  75 mg Oral Daily  . levofloxacin (LEVAQUIN) IV  750 mg Intravenous Q24H  . levothyroxine  25 mcg Oral QAC breakfast  . metoprolol tartrate  12.5 mg Oral BID  . pantoprazole  40 mg Oral Q1200  . polyethylene glycol  17 g Oral Daily  . potassium chloride SA  20 mEq Oral Daily  . predniSONE  60 mg Oral Q breakfast  . risperiDONE  0.25 mg Oral QHS  . sodium chloride  3 mL Intravenous Q12H  . Warfarin - Pharmacist Dosing Inpatient   Does not  apply q1800   Continuous Infusions:   Active Problems:   Atrial fibrillation   Hypothyroidism   COPD (chronic obstructive pulmonary disease)   Diastolic heart failure secondary to hypertension   Streptococcal pneumonia   Long-term (current) use of anticoagulants   Delirium due to multiple etiologies   Steroid-induced psychosis    Time spent: 30 min    Asusena Sigley, [facility] Pager [phone]. If 7PM-7AM, please contact night-coverage at [email or web address], password [facility] 02/15/2013, 7:58 PM  LOS: 3 days

## 2013-02-15 NOTE — Progress Notes (Signed)
ANTICOAGULATION CONSULT NOTE - Follow Up Consult  Pharmacy Consult for warfarin Indication: atrial fibrillation  Allergies  Allergen Reactions  . Ceclor [Cefaclor] Other (See Comments)    Reaction unknown  . Elastic Bandages & [Zinc] Other (See Comments)    Turns red on the areas it touches  . Lipitor [Atorvastatin Calcium] Other (See Comments)    Increased fibromyalgia pain  . Mevacor [Lovastatin] Other (See Comments)    Increased fibromyalgia pain  . Pravachol Other (See Comments)    Increased fibromyalgia pain  . Vasotec Other (See Comments)    Reaction unknown  . Adhesive [Tape] Itching and Rash    Burning   . Latex Itching and Rash    Burning     Patient Measurements: Height: 5\' 4"  (162.6 cm) Weight: 167 lb 1.7 oz (75.8 kg) IBW/kg (Calculated) : 54.7    Vital Signs: Temp: 97.4 F (36.3 C) (01/06 0600) Temp src: Oral (01/06 0600) BP: 133/63 mmHg (01/06 0600) Pulse Rate: 65 (01/06 0600)  Labs:  Recent Labs  02/13/13 0249  02/14/13 0210 02/14/13 0806 02/14/13 1425 02/15/13 0601  HGB 11.8*  --  12.5  --   --  12.5  HCT 36.7  --  37.8  --   --  37.4  PLT 219  --  227  --   --  256  LABPROT 18.6*  --  23.5*  --   --  27.1*  INR 1.60*  --  2.17*  --   --  2.62*  CREATININE 0.71  --  0.71  --   --  0.85  TROPONINI <0.30  < > 0.52* 0.41* 0.36*  --   < > = values in this interval not displayed.  Estimated Creatinine Clearance: 56.1 ml/min (by C-G formula based on Cr of 0.85).  Assessment: 79 yr female presents with complaints of shortness of breath - admitted with CAP and placed on Levaquin. She is on warfarin therapy for atrial fibrillation, pharmacy asked to dose warfarin while in hospital.  INR at time of admission was subtherapeutic @ 1.53.    Home dose: warfarin 7.5mg  daily except for 5 mg on Tue/Thur/Sat PTA - last dose taken on 02/11/13  Last visit to coumadin clinic, INR = 2.4 on above dose  INR therapeutic at 2.62  CBC: Hgb, platelets  WNL  Potential drug interactions with warfarin: Levofloxacin and corticosteroids - both my increase warfarin's effect.  Last dose of Levaquin planned for 1/7.  Goal of Therapy:  INR 2-3 Monitor platelets by anticoagulation protocol: Yes   Plan:   Warfarin 2.5mg  PO x 1 tonight.  Low dose tonight due to quick increase in INR.  Sensitivity to warfarin increase most likely secondary to potential drug interactions and acute illness.    Daily INR.  Hershal Coria, PharmD, BCPS Pager: [phone] 02/15/2013 10:30 AM

## 2013-02-16 LAB — CBC
HEMATOCRIT: 39.8 % (ref 36.0–46.0)
HEMOGLOBIN: 13.3 g/dL (ref 12.0–15.0)
MCH: 30.5 pg (ref 26.0–34.0)
MCHC: 33.4 g/dL (ref 30.0–36.0)
MCV: 91.3 fL (ref 78.0–100.0)
Platelets: 286 10*3/uL (ref 150–400)
RBC: 4.36 MIL/uL (ref 3.87–5.11)
RDW: 14.6 % (ref 11.5–15.5)
WBC: 9 10*3/uL (ref 4.0–10.5)

## 2013-02-16 LAB — BASIC METABOLIC PANEL
BUN: 24 mg/dL — AB (ref 6–23)
CO2: 26 mEq/L (ref 19–32)
CREATININE: 0.82 mg/dL (ref 0.50–1.10)
Calcium: 9.3 mg/dL (ref 8.4–10.5)
Chloride: 100 mEq/L (ref 96–112)
GFR, EST AFRICAN AMERICAN: 79 mL/min — AB (ref >90)
GFR, EST NON AFRICAN AMERICAN: 68 mL/min — AB (ref >90)
GLUCOSE: 118 mg/dL — AB (ref 70–99)
POTASSIUM: 4.4 meq/L (ref 3.7–5.3)
Sodium: 140 mEq/L (ref 137–147)

## 2013-02-16 LAB — PROTIME-INR
INR: 2.44 — AB (ref 0.00–1.49)
Prothrombin Time: 25.7 seconds — ABNORMAL HIGH (ref 11.6–15.2)

## 2013-02-16 MED ORDER — WARFARIN SODIUM 5 MG PO TABS
5.0000 mg | ORAL_TABLET | Freq: Once | ORAL | Status: AC
  Administered 2013-02-16: 17:00:00 5 mg via ORAL
  Filled 2013-02-16: qty 1

## 2013-02-16 MED ORDER — FUROSEMIDE 80 MG PO TABS
80.0000 mg | ORAL_TABLET | Freq: Two times a day (BID) | ORAL | Status: DC
Start: 2013-02-16 — End: 2013-02-17
  Administered 2013-02-16 – 2013-02-17 (×3): 80 mg via ORAL
  Filled 2013-02-16 (×5): qty 1

## 2013-02-16 MED ORDER — IPRATROPIUM BROMIDE 0.02 % IN SOLN
0.5000 mg | Freq: Three times a day (TID) | RESPIRATORY_TRACT | Status: DC
  Administered 2013-02-16 – 2013-02-19 (×9): 0.5 mg via RESPIRATORY_TRACT
  Filled 2013-02-16 (×8): qty 2.5

## 2013-02-16 MED ORDER — LEVALBUTEROL HCL 0.63 MG/3ML IN NEBU
0.6300 mg | INHALATION_SOLUTION | Freq: Three times a day (TID) | RESPIRATORY_TRACT | Status: DC
  Administered 2013-02-16 – 2013-02-19 (×9): 0.63 mg via RESPIRATORY_TRACT
  Filled 2013-02-16 (×18): qty 3

## 2013-02-16 MED ORDER — LEVOFLOXACIN IN D5W 750 MG/150ML IV SOLN
750.0000 mg | INTRAVENOUS | Status: DC
  Administered 2013-02-17 – 2013-02-18 (×2): 750 mg via INTRAVENOUS
  Filled 2013-02-16 (×2): qty 150

## 2013-02-16 NOTE — Progress Notes (Signed)
ANTICOAGULATION CONSULT NOTE - Follow Up Consult  Pharmacy Consult for warfarin Indication: atrial fibrillation  Allergies  Allergen Reactions  . Ceclor [Cefaclor] Other (See Comments)    Reaction unknown  . Elastic Bandages & [Zinc] Other (See Comments)    Turns red on the areas it touches  . Lipitor [Atorvastatin Calcium] Other (See Comments)    Increased fibromyalgia pain  . Mevacor [Lovastatin] Other (See Comments)    Increased fibromyalgia pain  . Pravachol Other (See Comments)    Increased fibromyalgia pain  . Vasotec Other (See Comments)    Reaction unknown  . Adhesive [Tape] Itching and Rash    Burning   . Latex Itching and Rash    Burning     Patient Measurements: Height: 5\' 4"  (162.6 cm) Weight: 163 lb 9.6 oz (74.208 kg) IBW/kg (Calculated) : 54.7    Vital Signs: Temp: 97.4 F (36.3 C) (01/07 0635) Temp src: Oral (01/07 0635) BP: 151/70 mmHg (01/07 0635) Pulse Rate: 68 (01/07 0635)  Labs:  Recent Labs  02/14/13 0210 02/14/13 0806 02/14/13 1425 02/15/13 0601 02/16/13 0553  HGB 12.5  --   --  12.5 13.3  HCT 37.8  --   --  37.4 39.8  PLT 227  --   --  256 286  LABPROT 23.5*  --   --  27.1* 25.7*  INR 2.17*  --   --  2.62* 2.44*  CREATININE 0.71  --   --  0.85 0.82  TROPONINI 0.52* 0.41* 0.36*  --   --     Estimated Creatinine Clearance: 57.6 ml/min (by C-G formula based on Cr of 0.82).  Assessment: 79 yr female presents with complaints of shortness of breath - admitted with CAP and placed on Levaquin. She is on warfarin therapy for atrial fibrillation, pharmacy asked to dose warfarin while in hospital.  INR at time of admission was subtherapeutic @ 1.53.    Home dose: warfarin 7.5mg  daily except for 5 mg on Tue/Thur/Sat PTA.  Last visit to coumadin clinic, INR = 2.4 on above dose  INR therapeutic at 2.44.  Gave a lower dose last night (2.5 mg) due to quick increase in INR.  INR decreased slightly today.  Sensitivity to warfarin increase most  likely secondary to potential drug interactions and acute illness.  CBC: Hgb, platelets WNL  Potential drug interactions with warfarin: Levofloxacin and corticosteroids - both may increase warfarin's effect.  Last dose of Levaquin planned for 1/7.  Note patient is also on Plavix and ASA.  Goal of Therapy:  INR 2-3 Monitor platelets by anticoagulation protocol: Yes   Plan:   Warfarin 5mg  PO x 1 tonight.   Daily INR.  Hershal Coria, PharmD, BCPS Pager: [phone] 02/16/2013 8:30 AM

## 2013-02-16 NOTE — Evaluation (Signed)
Physical Therapy Evaluation Patient Details Name: Amber [name] MRN: [id number] DOB: [birth year] Today's Date: 02/16/2013 Time: 1601-0932 PT Time Calculation (min): 23 min  PT Assessment / Plan / Recommendation History of Present Illness    Amber [name] is a 79 y.o. female  has a past medical history of Coronary artery disease; Hypertension; Hyperlipidemia; Hypothyroidism; Depression; Insomnia; Fibromyalgia; Gout; Osteoarthritis; Osteoporosis; Cervical spinal stenosis; Nasal polyposis; Pneumonia; COPD with emphysema; Myocardial infarction; CHF (congestive heart failure); Atrial fibrillation; CVA (cerebral vascular accident); GERD (gastroesophageal reflux disease); dizziness; Intermittent confusion; and Cancer.  Presented with  5 days hx of chest congestion, cough, denies any fever, no chest pain. She went to her PCP and was prescribed z-pack 3 days ago which she took. Her symptoms continued and today her shortness of breath got severe.  She started to wheeze and coughing up greenish sputum with some pink and brown strains.  Endorses slight leg swelling.  Pt was found to have a.fib w RVR and evidence of PNA on CXR. She was also noted to be hypoxic needing 2L of O2. Family noted she is confused worse than baseline Hospitalist called for admission.      Clinical Impression  Pt with hx as above presents with functional mobility limitations 2* generalized weakness, ambulatory balance deficits and residual effect of CVA many years ago.  Pt very cooperative and motivated and would benefit from follow up HHPT to maximize safety and IND at home.    PT Assessment  Patient needs continued PT services    Follow Up Recommendations  Home health PT    Does the patient have the potential to tolerate intense rehabilitation      Barriers to Discharge        Equipment Recommendations  None recommended by PT    Recommendations for Other Services OT consult   Frequency Min 3X/week    Precautions /  Restrictions Precautions Precautions: Fall Precaution Comments: Pt admits to hx of falls at home Required Braces or Orthoses: Other Brace/Splint Other Brace/Splint: Shoes with ankle support on L Restrictions Weight Bearing Restrictions: No   Pertinent Vitals/Pain No c/o pain at this time      Mobility  Transfers Overall transfer level: Needs assistance Transfers: Sit to/from Stand Sit to Stand: Min assist General transfer comment: cues for use of UEs to self assist Ambulation/Gait Ambulation/Gait assistance: Min assist;Min guard Ambulation Distance (Feet): 450 Feet Assistive device: Rolling walker (2 wheeled) Gait Pattern/deviations: Step-through pattern;Shuffle;Narrow base of support General Gait Details: cues for posture and position from RW,     Exercises     PT Diagnosis: Difficulty walking  PT Problem List: Decreased strength;Decreased range of motion;Decreased activity tolerance;Decreased mobility;Decreased knowledge of use of DME;Decreased balance;Decreased safety awareness PT Treatment Interventions: DME instruction;Gait training;Stair training;Functional mobility training;Therapeutic activities;Therapeutic exercise;Balance training;Patient/family education     PT Goals(Current goals can be found in the care plan section) Acute Rehab PT Goals Patient Stated Goal: Resume previous lifestyle with decreased risk of falls PT Goal Formulation: With patient Time For Goal Achievement: 03/02/13 Potential to Achieve Goals: Good  Visit Information  Last PT Received On: 02/16/13 Assistance Needed: +1       Prior [place] expects to be discharged to:: Private residence Living Arrangements: Spouse/significant other Available Help at Discharge: Family Type of Home: House Home Access: Stairs to enter Technical brewer of Steps: 3 Entrance Stairs-Rails: [place]: One [place]: Environmental consultant - 4 wheels;Walker - 2  wheels Prior Function Level  of Independence: Needs assistance;Independent with assistive device(s) Communication Communication: No difficulties Dominant Hand: Right    Cognition  Cognition Arousal/Alertness: Awake/alert Behavior During Therapy: WFL for tasks assessed/performed Overall Cognitive Status: Within Functional Limits for tasks assessed    Extremity/Trunk Assessment Upper Extremity Assessment Upper Extremity Assessment: LUE deficits/detail RUE Deficits / Details: ltd use of R hand 28 to arthritic changes LUE Deficits / Details: generalized weakness; ltd use of hand 2* arthritc changes Lower Extremity Assessment Lower Extremity Assessment: LLE deficits/detail LLE Deficits / Details: strength 3+/5  LLE Sensation: decreased proprioception LLE Coordination: decreased gross motor (noted with attempts to put on shoes) Cervical / Trunk Assessment Cervical / Trunk Assessment: Kyphotic   Balance    End of Session PT - End of Session Equipment Utilized During Treatment: Gait belt Activity Tolerance: Patient tolerated treatment well Patient left: in chair;with call bell/phone within reach;with family/visitor present Nurse Communication: Mobility status  GP     Cuca Benassi 02/16/2013, 5:12 PM

## 2013-02-16 NOTE — Progress Notes (Addendum)
Patient ID: Amber [name], female   DOB: 1935-02-06, 79 y.o.   MRN: [id number]  [facility] PROGRESS NOTE  Amber [name] [id number] DOB: 09-Jun-1934 DOA: 02/12/2013 PCP: Gara Kroner, MD  Brief narrative: 79 yo F with hx of CAD, HTN, HLD, CHF, A-fib, hypothyroidism, COPD, found to have S. Pneumo a pneumonia, treating with levofloxacin. Had a-fib with RVR at presentation and HR rapidly improved, but troponins rose, likely demand ischemia/NSTEMI.  Assessment/Plan  A-fib with RVR - rhythm still irregular but rate controlled - continue Coumadin per pharmacy - also continue digoxin and metoprolol 12.5 mg BID Acute on chronic respiratory failure - multifactorial and secondary to strep pneumo PNA imposed on COPD and acute on chronic diastolic CHF  - has received Levaquin for 3 doses, continue today as well total 7 days Strep Pneumo PNA - continue Levaquin day #4/7 Chronic COPD - pt is clinically stable this AM and maintaining oxygen saturation at target range - continue Prednisone tapering  Acute on chronic diastolic CHF - volume status improving and weight this AM 162 lbs - continue Lasix PO 80 mg BID Elevated troponin - likely demand ischemia in setting of atrial fibrillation with RVR and volume overload - pt denies chest pain this AM Dementia/depression - stable - continue aricept and celexa  HTN - reasonable inpatient control  - Continue norvasc and ARB  Hypothyroidism - continue Synthroid  Diet: Healthy heart  Proph: Coumadin   Code Status: full  Family Communication: patient and daughter at bedside  Disposition Plan: Continue to monitor on telemetry   Consultants:  Cardiology Procedures:  01/05 CXR --> Stable changes in the left base. Continued followup is recommended. Antibiotics:  Ceftriaxone 1/4 x 1 dose  Azithromycin 1/4 x 1 dose  Levofloxacin 1/4 -->   HPI/Subjective: No events overnight.   Objective: Filed Vitals:   02/16/13 1011 02/16/13 1132  02/16/13 1500 02/16/13 1700  BP:  146/73 143/63   Pulse:  82 79   Temp:   97.5 F (36.4 C)   TempSrc:   Oral   Resp:   18   Height:      Weight:    73.528 kg (162 lb 1.6 oz)  SpO2: 96%  95%     Intake/Output Summary (Last 24 hours) at 02/16/13 1829 Last data filed at 02/16/13 1600  Gross per 24 hour  Intake    870 ml  Output   1601 ml  Net   -731 ml    Exam:   General:  Pt is alert, follows commands appropriately, not in acute distress  Cardiovascular: Irregular rate and rhythm, no murmurs, no rubs, no gallops  Respiratory: Clear to auscultation bilaterally, no wheezing, no crackles, no rhonchi  Abdomen: Soft, non tender, non distended, bowel sounds present, no guarding  Extremities: No edema, pulses DP and PT palpable bilaterally  Neuro: Grossly nonfocal  Data Reviewed: Basic Metabolic Panel:  Recent Labs Lab 02/12/13 2020 02/13/13 0249 02/14/13 0210 02/15/13 0601 02/16/13 0553  NA 137 132* 134* 136* 140  K 3.6* 3.6* 4.5 4.2 4.4  CL 92* 91* 96 100 100  CO2 28 22 25 23 26   GLUCOSE 139* 253* 176* 141* 118*  BUN 11 12 22  27* 24*  CREATININE 0.69 0.71 0.71 0.85 0.82  CALCIUM 10.0 9.1 9.4 9.0 9.3   Liver Function Tests:  Recent Labs Lab 02/13/13 0249  AST 34  ALT 20  ALKPHOS 45  BILITOT 0.5  PROT 6.6  ALBUMIN 3.5   CBC:  Recent Labs Lab 02/12/13 2020 02/13/13 0249 02/14/13 0210 02/15/13 0601 02/16/13 0553  WBC 13.4* 16.0* 21.0* 14.1* 9.0  NEUTROABS 11.6* 15.1*  --   --   --   HGB 13.8 11.8* 12.5 12.5 13.3  HCT 41.3 36.7 37.8 37.4 39.8  MCV 92.0 91.5 91.1 91.2 91.3  PLT 248 219 227 256 286   Cardiac Enzymes:  Recent Labs Lab 02/13/13 1402 02/13/13 2002 02/14/13 0210 02/14/13 0806 02/14/13 1425  TROPONINI 0.47* 0.50* 0.52* 0.41* 0.36*   CBG:  Recent Labs Lab 02/14/13 1550  GLUCAP 123*    Recent Results (from the past 240 hour(s))  CULTURE, BLOOD (ROUTINE X 2)     Status: None   Collection Time    02/12/13  7:43 PM       Result Value Range Status   Specimen Description BLOOD RIGHT ARM   Final   Special Requests BOTTLES DRAWN AEROBIC AND ANAEROBIC 5CC   Final   Culture  Setup Time     Final   Value: 02/13/2013 01:27     Performed at [facility]   Culture     Final   Value:        BLOOD CULTURE RECEIVED NO GROWTH TO DATE CULTURE WILL BE HELD FOR 5 DAYS BEFORE ISSUING A FINAL NEGATIVE REPORT     Performed at [facility]   Report Status PENDING   Incomplete  CULTURE, BLOOD (ROUTINE X 2)     Status: None   Collection Time    02/12/13  8:20 PM      Result Value Range Status   Specimen Description BLOOD RIGHT HAND   Final   Special Requests BOTTLES DRAWN AEROBIC AND ANAEROBIC 5CC   Final   Culture  Setup Time     Final   Value: 02/13/2013 01:26     Performed at [facility]   Culture     Final   Value:        BLOOD CULTURE RECEIVED NO GROWTH TO DATE CULTURE WILL BE HELD FOR 5 DAYS BEFORE ISSUING A FINAL NEGATIVE REPORT     Performed at [facility]   Report Status PENDING   Incomplete  MRSA PCR SCREENING     Status: None   Collection Time    02/13/13  1:01 AM      Result Value Range Status   MRSA by PCR NEGATIVE  NEGATIVE Final   Comment:            The GeneXpert MRSA Assay (FDA     approved for NASAL specimens     only), is one component of a     comprehensive MRSA colonization     surveillance program. It is not     intended to diagnose MRSA     infection nor to guide or     monitor treatment for     MRSA infections.  URINE CULTURE     Status: None   Collection Time    02/14/13  6:53 AM      Result Value Range Status   Specimen Description URINE, RANDOM   Final   Special Requests NONE   Final   Culture  Setup Time     Final   Value: 02/14/2013 09:03     Performed at [place]     Final   Value: NO GROWTH     Performed at [facility]  Final   Value: NO GROWTH     Performed at [facility]   Report  Status 02/15/2013 FINAL   Final     Scheduled Meds: . allopurinol  300 mg Oral QPM  . amitriptyline  50 mg Oral QHS  . amLODipine  5 mg Oral QPM  . aspirin EC  81 mg Oral Daily  . citalopram  20 mg Oral BID  . clopidogrel  75 mg Oral Q breakfast  . digoxin  0.125 mg Oral QPM  . docusate sodium  100 mg Oral BID  . donepezil  5 mg Oral Daily  . furosemide  80 mg Oral BID  . guaiFENesin  600 mg Oral BID  . ipratropium  0.5 mg Nebulization TID  . irbesartan  75 mg Oral Daily  . levalbuterol  0.63 mg Nebulization TID  . levothyroxine  25 mcg Oral QAC breakfast  . metoprolol tartrate  12.5 mg Oral BID  . pantoprazole  40 mg Oral Q1200  . polyethylene glycol  17 g Oral Daily  . potassium chloride SA  20 mEq Oral Daily  . predniSONE  60 mg Oral Q breakfast  . risperiDONE  0.25 mg Oral QHS  . Warfarin -    Does not apply q1800   Continuous Infusions:  Moise Boring, MD  [facility] Pager [phone]  If 7PM-7AM, please contact night-coverage [email or web address] Password [email or web address] 02/16/2013, 6:29 PM   LOS: 4 days

## 2013-02-16 NOTE — Progress Notes (Signed)
Patient ID: Amber [name], female   DOB: June 04, 1934, 79 y.o.   MRN: [id number]   SUBJECTIVE: Doing better, no confusion.  Breathing well off oxygen.  Diuresed well.    . allopurinol  300 mg Oral QPM  . amitriptyline  50 mg Oral QHS  . amLODipine  5 mg Oral QPM  . aspirin EC  81 mg Oral Daily  . citalopram  20 mg Oral BID  . clopidogrel  75 mg Oral Q breakfast  . digoxin  0.125 mg Oral QPM  . docusate sodium  100 mg Oral BID  . donepezil  5 mg Oral Daily  . furosemide  80 mg Oral BID  . guaiFENesin  600 mg Oral BID  . ipratropium  0.5 mg Nebulization TID  . irbesartan  75 mg Oral Daily  . levalbuterol  0.63 mg Nebulization TID  . levofloxacin (LEVAQUIN) IV  750 mg Intravenous Q24H  . levothyroxine  25 mcg Oral QAC breakfast  . metoprolol tartrate  12.5 mg Oral BID  . pantoprazole  40 mg Oral Q1200  . polyethylene glycol  17 g Oral Daily  . potassium chloride SA  20 mEq Oral Daily  . predniSONE  60 mg Oral Q breakfast  . risperiDONE  0.25 mg Oral QHS  . sodium chloride  3 mL Intravenous Q12H  . warfarin  5 mg Oral ONCE-1800  . Warfarin - Pharmacist Dosing Inpatient   Does not apply q1800     Filed Vitals:   02/15/13 1836 02/15/13 2017 02/15/13 2043 02/16/13 0635  BP: 146/54 152/70  151/70  Pulse: 64 73  68  Temp: 97.1 F (36.2 C) 97.7 F (36.5 C)  97.4 F (36.3 C)  TempSrc: Oral Oral  Oral  Resp: 18 16  20   Height:      Weight:   74.208 kg (163 lb 9.6 oz)   SpO2: 96% 95%  92%    Intake/Output Summary (Last 24 hours) at 02/16/13 0856 Last data filed at 02/15/13 2300  Gross per 24 hour  Intake    390 ml  Output   1751 ml  Net  -1361 ml    LABS: Basic Metabolic Panel:  Recent Labs  02/15/13 0601 02/16/13 0553  NA 136* 140  K 4.2 4.4  CL 100 100  CO2 23 26  GLUCOSE 141* 118*  BUN 27* 24*  CREATININE 0.85 0.82  CALCIUM 9.0 9.3   Liver Function Tests: No results found for this basename: AST, ALT, ALKPHOS, BILITOT, PROT, ALBUMIN,  in the last 72  hours No results found for this basename: LIPASE, AMYLASE,  in the last 72 hours CBC:  Recent Labs  02/15/13 0601 02/16/13 0553  WBC 14.1* 9.0  HGB 12.5 13.3  HCT 37.4 39.8  MCV 91.2 91.3  PLT 256 286   Cardiac Enzymes:  Recent Labs  02/14/13 0210 02/14/13 0806 02/14/13 1425  TROPONINI 0.52* 0.41* 0.36*   BNP: No components found with this basename: POCBNP,  D-Dimer: No results found for this basename: DDIMER,  in the last 72 hours Hemoglobin A1C:  Recent Labs  02/14/13 0210  HGBA1C 6.3*   Fasting Lipid Panel: No results found for this basename: CHOL, HDL, LDLCALC, TRIG, CHOLHDL, LDLDIRECT,  in the last 72 hours Thyroid Function Tests: No results found for this basename: TSH, T4TOTAL, FREET3, T3FREE, THYROIDAB,  in the last 72 hours Anemia Panel: No results found for this basename: VITAMINB12, FOLATE, FERRITIN, TIBC, IRON, RETICCTPCT,  in the last 72 hours  RADIOLOGY: Dg Chest 2 View  02/12/2013   CLINICAL DATA:  Influenza 08/06/2012  EXAM: CHEST  2 VIEW  COMPARISON:  None.  FINDINGS: No cardiomegaly. Status post CABG. Asymmetric reticular nodular opacity at the left base. No pleural effusion. No pulmonary edema.  T12 and L1 compression fractures, with questionable progression of height loss at the T12 fracture since 10/29/2012 abdominal CT  IMPRESSION: Early infiltrate/pneumonia at the left base.   Electronically Signed   By: Jorje Guild M.D.   On: 02/12/2013 22:36    PHYSICAL EXAM General: NAD Neck: JVP 7-8 cm, no thyromegaly or thyroid nodule.  Lungs: Decreased breath sounds left base.  CV: Nondisplaced PMI.  Heart irregular S1/S2, no S3/S4, no murmur.  No peripheral edema.   Abdomen: Soft, nontender, no hepatosplenomegaly, no distention.  Neurologic: Alert and oriented x 3.  Psych: Normal affect. Extremities: No clubbing or cyanosis.   TELEMETRY: Reviewed telemetry pt in atrial fibrillation, HR 70s  ASSESSMENT AND PLAN: 79 yo with history of COPD,  chronic atrial fibrillation, and CAD s/p CABG presented with LLL PNA, probable COPD exacerbation, and atrial fibrillation with RVR. 1. PNA: Pneumococcal PNA with probable co-existing COPD exacerbation.  Getting abx + prednisone.  2. CHF: Acute on chronic diastolic CHF.  She diuresed well again yesterday, volume status improved.  - Transition back to po Lasix at home dose.   3. Atrial fibrillation: Chronic, HR now controlled on metoprolol and digoxin.  She is on warfarin.  Digoxin level ok. Metoprolol decreased to 12.5 mg bid with some bradycardia.  4. Elevated troponin: Agree with Dr. Wynonia Lawman that this is likely demand ischemia in setting of atrial fibrillation with RVR and volume overload.  Will likely plan for outpatient stress when she has recovered.   Please call with further questions.  I will arrange her outpatient followup.   [name] 02/16/2013 8:56 AM

## 2013-02-17 ENCOUNTER — Inpatient Hospital Stay (HOSPITAL_COMMUNITY): Payer: Medicare Other

## 2013-02-17 LAB — BASIC METABOLIC PANEL
BUN: 19 mg/dL (ref 6–23)
CALCIUM: 8.8 mg/dL (ref 8.4–10.5)
CO2: 26 mEq/L (ref 19–32)
Chloride: 97 mEq/L (ref 96–112)
Creatinine, Ser: 0.76 mg/dL (ref 0.50–1.10)
GFR, EST NON AFRICAN AMERICAN: 80 mL/min — AB (ref >90)
GLUCOSE: 134 mg/dL — AB (ref 70–99)
POTASSIUM: 3.9 meq/L (ref 3.7–5.3)
Sodium: 139 mEq/L (ref 137–147)

## 2013-02-17 LAB — CBC
HCT: 39.3 % (ref 36.0–46.0)
HEMOGLOBIN: 13.1 g/dL (ref 12.0–15.0)
MCH: 30.5 pg (ref 26.0–34.0)
MCHC: 33.3 g/dL (ref 30.0–36.0)
MCV: 91.6 fL (ref 78.0–100.0)
Platelets: 294 10*3/uL (ref 150–400)
RBC: 4.29 MIL/uL (ref 3.87–5.11)
RDW: 14.5 % (ref 11.5–15.5)
WBC: 8.9 10*3/uL (ref 4.0–10.5)

## 2013-02-17 LAB — PROTIME-INR
INR: 2.37 — AB (ref 0.00–1.49)
Prothrombin Time: 25.1 seconds — ABNORMAL HIGH (ref 11.6–15.2)

## 2013-02-17 MED ORDER — FUROSEMIDE 10 MG/ML IJ SOLN: 40.0000 mg | Freq: Once | INTRAMUSCULAR | Status: DC

## 2013-02-17 MED ORDER — PREDNISONE 20 MG PO TABS
30.0000 mg | ORAL_TABLET | Freq: Every day | ORAL | Status: DC
  Administered 2013-02-18: 10:00:00 30 mg via ORAL
  Filled 2013-02-17 (×2): qty 1

## 2013-02-17 MED ORDER — WARFARIN SODIUM 5 MG PO TABS
5.0000 mg | ORAL_TABLET | Freq: Once | ORAL | Status: AC
  Administered 2013-02-17: 5 mg via ORAL
  Filled 2013-02-17: qty 1

## 2013-02-17 MED ORDER — FUROSEMIDE 10 MG/ML IJ SOLN
80.0000 mg | Freq: Two times a day (BID) | INTRAMUSCULAR | Status: DC
  Administered 2013-02-17 – 2013-02-18 (×2): 80 mg via INTRAVENOUS
  Filled 2013-02-17 (×4): qty 8

## 2013-02-17 NOTE — Progress Notes (Addendum)
Physical Therapy Treatment Patient Details Name: LU PARADISE MRN: [id number] DOB: 04/17/1934 Today's Date: 02/17/2013 Time: 6283-6629 PT Time Calculation (min): 14 min  PT Assessment / Plan / Recommendation  History of Present Illness TABYTHA GRADILLAS is a 79 y.o. female admitted with hypoxia and A fib.  Pt  has a history of CVA in which she was I prior to this admission with ADL activity   PT Comments   Pt progressing , pleasant and motivated  Follow Up Recommendations  Home health PT     Does the patient have the potential to tolerate intense rehabilitation     Barriers to Discharge        Equipment Recommendations  None recommended by PT    Recommendations for Other Services    Frequency Min 3X/week   Progress towards PT Goals Progress towards PT goals: Progressing toward goals  Plan Current plan remains appropriate    Precautions / Restrictions Precautions Precautions: Fall Precaution Comments: Pt admits to hx of falls at home Required Braces or Orthoses: Other Brace/Splint Other Brace/Splint: Shoes with ankle support on L Restrictions Weight Bearing Restrictions: No   Pertinent Vitals/Pain No c/o pain; IV site bleeding at PT arrival; checked and dressing changed byRN O2 sats 98% on RA during activity HR 92   Mobility  Bed Mobility General bed mobility comments: overall S with  Transfers Overall transfer level: Needs assistance Equipment used: Rolling walker (2 wheeled) Transfers: Sit to/from Stand Sit to Stand: Min assist General transfer comment: min assist for anterior wt shift to come to stand Ambulation/Gait Ambulation/Gait assistance: Min guard;Supervision Ambulation Distance (Feet): 450 Feet Assistive device: Rolling walker (2 wheeled) Gait Pattern/deviations: Step-through pattern (L AFO) General Gait Details: cues for posture and position from RW,     Exercises     PT Diagnosis:    PT Problem List:   PT Treatment Interventions:     PT Goals  (current goals can now be found in the care plan section) Acute Rehab PT Goals Patient Stated Goal: return home with husband PT Goal Formulation: With patient Time For Goal Achievement: 03/02/13 Potential to Achieve Goals: Good  Visit Information  Last PT Received On: 02/17/13 Assistance Needed: +1 History of Present Illness: CINA KLUMPP is a 79 y.o. female admitted with hypoxia and A fib.  Pt  has a history of CVA in which she was I prior to this admission with ADL activity    Subjective Data  Patient Stated Goal: return home with husband   Cognition  Cognition Arousal/Alertness: Awake/alert Behavior During Therapy: WFL for tasks assessed/performed Overall Cognitive Status: Within Functional Limits for tasks assessed    Balance     End of Session PT - End of Session Activity Tolerance: Patient tolerated treatment well Patient left: in chair;with call bell/phone within reach;with family/visitor present Nurse Communication: Mobility status   GP     [facility] 02/17/2013, 3:39 PM

## 2013-02-17 NOTE — Progress Notes (Signed)
ANTICOAGULATION CONSULT NOTE - Follow Up Consult  Pharmacy Consult for warfarin Indication: atrial fibrillation  Allergies  Allergen Reactions  . Ceclor [Cefaclor] Other (See Comments)    Reaction unknown  . Elastic Bandages & [Zinc] Other (See Comments)    Turns red on the areas it touches  . Lipitor [Atorvastatin Calcium] Other (See Comments)    Increased fibromyalgia pain  . Mevacor [Lovastatin] Other (See Comments)    Increased fibromyalgia pain  . Pravachol Other (See Comments)    Increased fibromyalgia pain  . Vasotec Other (See Comments)    Reaction unknown  . Adhesive [Tape] Itching and Rash    Burning   . Latex Itching and Rash    Burning     Patient Measurements: Height: 5\' 4"  (162.6 cm) Weight: 162 lb 1.6 oz (73.528 kg) IBW/kg (Calculated) : 54.7    Vital Signs: Temp: 97.5 F (36.4 C) (01/08 0355) Temp src: Oral (01/08 0355) BP: 155/60 mmHg (01/08 0355) Pulse Rate: 95 (01/08 0355)  Labs:  Recent Labs  02/14/13 0806 02/14/13 1425  02/15/13 0601 02/16/13 0553 02/17/13 0516  HGB  --   --   < > 12.5 13.3 13.1  HCT  --   --   --  37.4 39.8 39.3  PLT  --   --   --  256 286 294  LABPROT  --   --   --  27.1* 25.7* 25.1*  INR  --   --   --  2.62* 2.44* 2.37*  CREATININE  --   --   --  0.85 0.82 0.76  TROPONINI 0.41* 0.36*  --   --   --   --   < > = values in this interval not displayed.  Estimated Creatinine Clearance: 58.7 ml/min (by C-G formula based on Cr of 0.76).  Assessment: 79 yr female presents with complaints of shortness of breath - admitted with CAP and placed on Levaquin. She is on warfarin therapy for atrial fibrillation, pharmacy asked to dose warfarin while in hospital.  INR at time of admission was subtherapeutic @ 1.53.    Home dose: warfarin 7.5mg  daily except for 5 mg on Tue/Thur/Sat PTA.  Last visit to coumadin clinic, INR = 2.4 on above dose  INR remains therapeutic at 2.37.    CBC: Hgb, platelets WNL  Potential drug  interactions with warfarin: Levofloxacin and corticosteroids - both may increase warfarin's effect.  Last dose of Levaquin planned for 1/7.  Note patient is also on Plavix and ASA.  Goal of Therapy:  INR 2-3 Monitor platelets by anticoagulation protocol: Yes   Plan:   Warfarin 5mg  PO x 1 tonight per home regimen   Daily INR.  Peggyann Juba, PharmD, BCPS Pager: [phone]  02/17/2013 7:47 AM

## 2013-02-17 NOTE — Care Management Note (Addendum)
    Page 1 of 2   02/19/2013     11:24:38 AM   CARE MANAGEMENT NOTE 02/19/2013  Patient:  Amber [name], Amber [name]   Account Number:  [id number]  Date Initiated:  02/14/2013  Documentation initiated by:  DAVIS,RHONDA  Subjective/Objective Assessment:   pt with multiple med problems and confirmed nstemi.  A,fib with rvr nnow controlled.     Action/Plan:   lives at home   Anticipated DC Date:  02/19/2013   Anticipated DC Plan:  [name]  CM consult      Choice offered to / List presented to:  [name]        [name] arranged  [name].   Status of service:  Completed, signed off Medicare Important Message given?   (If response is "NO", the following Medicare IM given date fields will be blank) Date Medicare IM given:   Date Additional Medicare IM given:    Discharge Disposition:  [name]  Per UR Regulation:  Reviewed for med. necessity/level of care/duration of stay  If discussed at [place] of Stay Meetings, dates discussed:    Comments:  02/19/13 Jarvin Ogren RN,BSN NCM [phone] D/C HOME W/HHPT/OT ORDERS.[facility] AWARE OF D/C,& HHPT/OT ORDER.NO FURTHER NEEDS OR ORDERS.  02/17/13 Nylen Creque RN,BSN NCM [phone] PT/OT-HH.[facility] CHOSEN FOR HH.TC REP [place] OF REFERRAL AWAIT FINAL HHPT/OT ORDERS.  [id number] [name], RN, BSN, [place] [phone] Chart Reviewed for discharge and hospital needs. Discharge needs at time of review:  None present will follow for needs. Review of patient progress due on [id number].

## 2013-02-17 NOTE — Evaluation (Signed)
Occupational Therapy Evaluation Patient Details Name: Amber [name] MRN: [id number] DOB: May 19, 1934 Today's Date: 02/17/2013 Time: 4098-1191 OT Time Calculation (min): 16 min  OT Assessment / Plan / Recommendation History of present illness Amber [name] is a 79 y.o. female admitted with hypoxia and A fib.  Pt  has a history of CVA in which she was I prior to this admission with ADL activity   Clinical Impression   Pt presents to OT to decreased I with ADL activity due to problems listed below. Pt will benefit from OT to increase I with ADL activity with ADL activity to decrease burden on family    OT Assessment  Patient needs continued OT Services    Follow Up Recommendations  Home health OT;Supervision/Assistance - 24 hour       Equipment Recommendations  None recommended by OT       Frequency  Min 2X/week    Precautions / Restrictions Precautions Precautions: Fall Precaution Comments: Pt admits to hx of falls at home Required Braces or Orthoses: Other Brace/Splint Other Brace/Splint: Shoes with ankle support on L Restrictions Weight Bearing Restrictions: No       ADL  Grooming: Wash/dry hands;Minimal assistance Where Assessed - Grooming: Supported standing Upper Body Bathing: Minimal assistance Where Assessed - Upper Body Bathing: Unsupported sitting Lower Body Bathing: Moderate assistance Where Assessed - Lower Body Bathing: Supported sit to stand Upper Body Dressing: Minimal assistance Where Assessed - Upper Body Dressing: Unsupported sitting Lower Body Dressing: Moderate assistance Where Assessed - Lower Body Dressing: Supported sit to Lobbyist: Moderate assistance Toilet Transfer Method: Sit to stand Toilet Transfer Equipment: Regular height toilet Toileting - Clothing Manipulation and Hygiene: Moderate assistance Where Assessed - Toileting Clothing Manipulation and Hygiene: Standing ADL Comments: IV in pts right arm limiting pts ability to  perform hygiene.    OT Diagnosis: Generalized weakness  OT Problem List: Decreased strength;Impaired balance (sitting and/or standing) OT Treatment Interventions: Self-care/ADL training;Patient/family education   OT Goals(Current goals can be found in the care plan section) Acute Rehab OT Goals Patient Stated Goal: return home with husband OT Goal Formulation: With patient Time For Goal Achievement: 03/03/13 Potential to Achieve Goals: Good  Visit Information  Last OT Received On: 02/17/13 Assistance Needed: +1 History of Present Illness: Amber [name] is a 79 y.o. female admitted with hypoxia and A fib.  Pt  has a history of CVA in which she was I prior to this admission with ADL activity       Prior [place] expects to be discharged to:: Private residence Living Arrangements: Spouse/significant other Available Help at Discharge: Family Type of Home: House Home Access: Stairs to enter Technical brewer of Steps: 3 Entrance Stairs-Rails: [place]: One level Skillman: Environmental consultant - 4 wheels;Walker - 2 wheels Prior Function Level of Independence: Needs assistance;Independent with assistive device(s) Communication Communication: No difficulties Dominant Hand: Right         Vision/Perception Vision - History Patient Visual Report: No change from baseline      Extremity/Trunk Assessment Upper Extremity Assessment RUE Deficits / Details: ltd use of R hand 28 to arthritic changes LUE Deficits / Details: generalized weakness; ltd use of hand 2* arthritc changes Lower Extremity Assessment LLE Deficits / Details: strength 3+/5  LLE Sensation: decreased proprioception LLE Coordination: decreased gross motor (noted with attempts to put on shoes) Cervical / Trunk Assessment Cervical / Trunk Assessment: Kyphotic     Mobility  Bed Mobility General bed mobility comments: overall S with  Transfers Overall transfer  level: Needs assistance Sit to Stand: Min assist           End of Session OT - End of Session Activity Tolerance: Patient tolerated treatment well Patient left: in chair;with call bell/phone within reach;with family/visitor present Nurse Communication: Mobility status  GO     Betsy Pries 02/17/2013, 1:03 PM

## 2013-02-17 NOTE — Progress Notes (Signed)
Patient ID: Amber [name], female   DOB: 28-Dec-1934, 79 y.o.   MRN: [id number]  [facility] PROGRESS NOTE  MAUDINE MCASKILL [id number] DOB: Oct 22, 1934 DOA: 02/12/2013 PCP: Gara Kroner, MD  Brief narrative: 79 yo F with hx of CAD, HTN, HLD, CHF, A-fib, hypothyroidism, COPD, found to have S. Pneumo pneumonia, treating with levofloxacin. Had a-fib with RVR at presentation and HR rapidly improved, but troponins rose, likely demand ischemia/NSTEMI.   Assessment/Plan  A-fib with RVR  - rhythm still irregular but rate controlled  - continue Coumadin per pharmacy  - also continue digoxin and metoprolol 12.5 mg BID  - no events on telemetry over 24 hours  Acute on chronic respiratory failure  - multifactorial and secondary to strep pneumo PNA imposed on COPD and acute on chronic diastolic CHF  - continue Levaquin day #5/7 Strep Pneumo PNA - continue Levaquin day #5/7  Chronic COPD  - pt is clinically stable this AM but slightly more congested on exam, maintaining oxygen saturation at target range  - continue Prednisone tapering  Acute on chronic diastolic CHF  - more crackles on exam this AM, Lasix transition to PO 1/7 but given more vascular congestion will change back to IV Lasix - monitor response closely and repeat CXR today - may be able to transition to PO in AM Elevated troponin  - likely demand ischemia in setting of atrial fibrillation with RVR and volume overload  - pt denies chest pain this AM  Dementia/depression  - stable  - continue aricept and celexa  HTN  - reasonable inpatient control  - Continue norvasc and ARB  Hypothyroidism  - continue Synthroid   Diet: Healthy heart  Proph: Coumadin   Code Status: full  Family Communication: patient and daughter at bedside, Miss [place] Silos over the phone   Disposition Plan: Continue to monitor on telemetry, not ready for discharge at this time    Consultants:  Cardiology Procedures:  01/05 CXR --> Stable changes in  the left base. Continued followup is recommended. Antibiotics:  Ceftriaxone 1/4 x 1 dose  Azithromycin 1/4 x 1 dose  Levofloxacin 1/4 -->   HPI/Subjective: No events overnight.   Objective: Filed Vitals:   02/17/13 0355 02/17/13 1058 02/17/13 1114 02/17/13 1424  BP: 155/60   141/65  Pulse: 95   79  Temp: 97.5 F (36.4 C)   97.6 F (36.4 C)  TempSrc: Oral   Oral  Resp: 16  16 18   Height:      Weight:  73.12 kg (161 lb 3.2 oz)    SpO2: 95%   99%    Intake/Output Summary (Last 24 hours) at 02/17/13 1825 Last data filed at 02/17/13 1426  Gross per 24 hour  Intake    390 ml  Output   1270 ml  Net   -880 ml    Exam:   General:  Pt is alert, follows commands appropriately, not in acute distress  Cardiovascular: Regular rate and rhythm, S1/S2, no murmurs, no rubs, no gallops  Respiratory: Clear to auscultation bilaterally, no wheezing, bibasilar crackles   Abdomen: Soft, non tender, non distended, bowel sounds present, no guarding  Extremities: +1 bilateral LE pitting edema, pulses DP and PT palpable bilaterally  Neuro: Grossly nonfocal  Data Reviewed: Basic Metabolic Panel:  Recent Labs Lab 02/13/13 0249 02/14/13 0210 02/15/13 0601 02/16/13 0553 02/17/13 0516  NA 132* 134* 136* 140 139  K 3.6* 4.5 4.2 4.4 3.9  CL 91* 96 100 100 97  CO2  22 25 23 26 26   GLUCOSE 253* 176* 141* 118* 134*  BUN 12 22 27* 24* 19  CREATININE 0.71 0.71 0.85 0.82 0.76  CALCIUM 9.1 9.4 9.0 9.3 8.8   Liver Function Tests:  Recent Labs Lab 02/13/13 0249  AST 34  ALT 20  ALKPHOS 45  BILITOT 0.5  PROT 6.6  ALBUMIN 3.5   CBC:  Recent Labs Lab 02/12/13 2020 02/13/13 0249 02/14/13 0210 02/15/13 0601 02/16/13 0553 02/17/13 0516  WBC 13.4* 16.0* 21.0* 14.1* 9.0 8.9  NEUTROABS 11.6* 15.1*  --   --   --   --   HGB 13.8 11.8* 12.5 12.5 13.3 13.1  HCT 41.3 36.7 37.8 37.4 39.8 39.3  MCV 92.0 91.5 91.1 91.2 91.3 91.6  PLT 248 219 227 256 286 294   Cardiac  Enzymes:  Recent Labs Lab 02/13/13 1402 02/13/13 2002 02/14/13 0210 02/14/13 0806 02/14/13 1425  TROPONINI 0.47* 0.50* 0.52* 0.41* 0.36*   CBG:  Recent Labs Lab 02/14/13 1550  GLUCAP 123*   Recent Results (from the past 240 hour(s))  CULTURE, BLOOD (ROUTINE X 2)     Status: None   Collection Time    02/12/13  7:43 PM      Result Value Range Status   Specimen Description BLOOD RIGHT ARM   Final   Special Requests BOTTLES DRAWN AEROBIC AND ANAEROBIC 5CC   Final   Culture  Setup Time     Final   Value: 02/13/2013 01:27     Performed at [facility]   Culture     Final   Value:        BLOOD CULTURE RECEIVED NO GROWTH TO DATE CULTURE WILL BE HELD FOR 5 DAYS BEFORE ISSUING A FINAL NEGATIVE REPORT     Performed at [facility]   Report Status PENDING   Incomplete  CULTURE, BLOOD (ROUTINE X 2)     Status: None   Collection Time    02/12/13  8:20 PM      Result Value Range Status   Specimen Description BLOOD RIGHT HAND   Final   Special Requests BOTTLES DRAWN AEROBIC AND ANAEROBIC 5CC   Final   Culture  Setup Time     Final   Value: 02/13/2013 01:26     Performed at [facility]   Culture     Final   Value:        BLOOD CULTURE RECEIVED NO GROWTH TO DATE CULTURE WILL BE HELD FOR 5 DAYS BEFORE ISSUING A FINAL NEGATIVE REPORT     Performed at [facility]   Report Status PENDING   Incomplete  MRSA PCR SCREENING     Status: None   Collection Time    02/13/13  1:01 AM      Result Value Range Status   MRSA by PCR NEGATIVE  NEGATIVE Final   Comment:            The GeneXpert MRSA Assay (FDA     approved for NASAL specimens     only), is one component of a     comprehensive MRSA colonization     surveillance program. It is not     intended to diagnose MRSA     infection nor to guide or     monitor treatment for     MRSA infections.  URINE CULTURE     Status: None   Collection Time    02/14/13  6:53 AM  Result Value Range Status    Specimen Description URINE, RANDOM   Final   Special Requests NONE   Final   Culture  Setup Time     Final   Value: 02/14/2013 09:03     Performed at [place]     Final   Value: NO GROWTH     Performed at [facility]   Culture     Final   Value: NO GROWTH     Performed at [facility]   Report Status 02/15/2013 FINAL   Final     Scheduled Meds: . allopurinol  300 mg Oral QPM  . amitriptyline  50 mg Oral QHS  . amLODipine  5 mg Oral QPM  . aspirin EC  81 mg Oral Daily  . citalopram  20 mg Oral BID  . clopidogrel  75 mg Oral Q breakfast  . digoxin  0.125 mg Oral QPM  . docusate sodium  100 mg Oral BID  . donepezil  5 mg Oral Daily  . furosemide  80 mg Intravenous BID  . guaiFENesin  600 mg Oral BID  . ipratropium  0.5 mg Nebulization TID  . irbesartan  75 mg Oral Daily  . levalbuterol  0.63 mg Nebulization TID  . levofloxacin IV  750 mg Intravenous Q24H  . levothyroxine  25 mcg Oral QAC breakfast  . metoprolol tartrate  12.5 mg Oral BID  . pantoprazole  40 mg Oral Q1200  . polyethylene glycol  17 g Oral Daily  . potassium chloride SA  20 mEq Oral Daily  . predniSONE  60 mg Oral Q breakfast  . risperiDONE  0.25 mg Oral QHS   Continuous Infusions:   Faye Ramsay, MD  [facility] Pager [phone]  If 7PM-7AM, please contact night-coverage [email or web address] Password [email or web address] 02/17/2013, 6:25 PM   LOS: 5 days

## 2013-02-18 LAB — BASIC METABOLIC PANEL
BUN: 18 mg/dL (ref 6–23)
CO2: 30 mEq/L (ref 19–32)
Calcium: 8.8 mg/dL (ref 8.4–10.5)
Chloride: 96 mEq/L (ref 96–112)
Creatinine, Ser: 0.82 mg/dL (ref 0.50–1.10)
GFR calc non Af Amer: 68 mL/min — ABNORMAL LOW (ref >90)
GFR, EST AFRICAN AMERICAN: 79 mL/min — AB (ref >90)
Glucose, Bld: 117 mg/dL — ABNORMAL HIGH (ref 70–99)
Potassium: 3.3 mEq/L — ABNORMAL LOW (ref 3.7–5.3)
Sodium: 137 mEq/L (ref 137–147)

## 2013-02-18 LAB — CBC
HCT: 42.3 % (ref 36.0–46.0)
Hemoglobin: 14.3 g/dL (ref 12.0–15.0)
MCH: 30.3 pg (ref 26.0–34.0)
MCHC: 33.8 g/dL (ref 30.0–36.0)
MCV: 89.6 fL (ref 78.0–100.0)
Platelets: 315 10*3/uL (ref 150–400)
RBC: 4.72 MIL/uL (ref 3.87–5.11)
RDW: 14.2 % (ref 11.5–15.5)
WBC: 9.8 10*3/uL (ref 4.0–10.5)

## 2013-02-18 LAB — PROTIME-INR
INR: 2.2 — ABNORMAL HIGH (ref 0.00–1.49)
Prothrombin Time: 23.7 seconds — ABNORMAL HIGH (ref 11.6–15.2)

## 2013-02-18 MED ORDER — POTASSIUM CHLORIDE CRYS ER 20 MEQ PO TBCR
20.0000 meq | EXTENDED_RELEASE_TABLET | Freq: Once | ORAL | Status: AC
  Administered 2013-02-18: 20 meq via ORAL
  Filled 2013-02-18 (×3): qty 1

## 2013-02-18 MED ORDER — PREDNISONE 10 MG PO TABS
10.0000 mg | ORAL_TABLET | Freq: Every day | ORAL | Status: DC
  Administered 2013-02-19: 10 mg via ORAL
  Filled 2013-02-18 (×2): qty 1

## 2013-02-18 MED ORDER — FUROSEMIDE 80 MG PO TABS
80.0000 mg | ORAL_TABLET | Freq: Two times a day (BID) | ORAL | Status: DC
  Administered 2013-02-18 – 2013-02-19 (×2): 80 mg via ORAL
  Filled 2013-02-18 (×4): qty 1

## 2013-02-18 MED ORDER — LEVOFLOXACIN 500 MG PO TABS
500.0000 mg | ORAL_TABLET | Freq: Every day | ORAL | Status: DC
  Administered 2013-02-19: 09:00:00 500 mg via ORAL
  Filled 2013-02-18: qty 1

## 2013-02-18 MED ORDER — WARFARIN SODIUM 5 MG PO TABS
5.0000 mg | ORAL_TABLET | Freq: Once | ORAL | Status: AC
  Administered 2013-02-18: 5 mg via ORAL
  Filled 2013-02-18: qty 1

## 2013-02-18 NOTE — Progress Notes (Signed)
OT Cancellation Note  Patient Details Name: Amber [name] MRN: [id number] DOB: 1934-02-13   Cancelled Treatment:    Reason Eval/Treat Not Completed: Other (comment)  Pt had just finished her second walk and wanted to rest.  Will check another time.    Janielle Mittelstadt 02/18/2013, 3:36 PM Lesle Chris, OTR/L [phone] 02/18/2013

## 2013-02-18 NOTE — Progress Notes (Signed)
Patient ID: Amber [name], female   DOB: [birth year], 79 y.o.   MRN: [id number]  [facility] PROGRESS NOTE  Amber [name] [id number] DOB: May 02, 1934 DOA: 02/12/2013 PCP: Gara Kroner, MD  Brief narrative: 79 yo F with hx of CAD, HTN, HLD, CHF, A-fib, hypothyroidism, COPD, found to have S. Pneumo pneumonia, treating with levofloxacin. Had a-fib with RVR at presentation and HR rapidly improved, but troponins rose, likely demand ischemia/NSTEMI.   Assessment/Plan  A-fib with RVR  - rhythm still irregular but rate controlled and pt is clinically stable - continue Coumadin per pharmacy  - also continue digoxin and metoprolol 12.5 mg BID  - no events on telemetry over 24 hours, will therefore discontinue telemetry   Acute on chronic respiratory failure  - multifactorial and secondary to strep pneumo PNA imposed on COPD and acute on chronic diastolic CHF  - continue Levaquin day #6/7, will transition to PO Levaquin   Strep Pneumo PNA - continue Levaquin day #6/7  Chronic COPD  - pt is clinically stable this AM and less congested on exam, maintaining oxygen saturation at target range  - continue Prednisone tapering, tomorrow Prednisone 10 mg PO and discontinue after tomorrow   Acute on chronic diastolic CHF  - pt diuresed well on Lasix 80 mg IV and with only minimal crackles on exam this AM - repeat CXR clear with no acute cardiopulmonary findings  - will transition to PO lasix this afternoon  - weight trend since admission 167 --> 164 --> 161 lbs this AM Elevated troponin  - likely demand ischemia in setting of atrial fibrillation with RVR and volume overload  - pt denies chest pain this AM  Dementia/depression  - stable  - continue aricept and celexa  HTN  - reasonable inpatient control  - Continue norvasc and ARB  Hypothyroidism  - continue Synthroid   Diet: Healthy heart  Proph: Coumadin   Code Status: full  Family Communication: patient and daughter at  bedside, Disposition Plan: Continue to monitor on telemetry, not ready for discharge at this time   Consultants:  Cardiology Procedures:  01/05 CXR --> Stable changes in the left base. Continued followup is recommended. 01/08 CXR --> No active cardiopulmonary disease.    Antibiotics:  Ceftriaxone 1/4 x 1 dose  Azithromycin 1/4 x 1 dose  Levofloxacin 1/4 --> transition to PO 01/09 --> stop date 01/10   HPI/Subjective: No events overnight.   Objective: Filed Vitals:   02/17/13 2022 02/17/13 2038 02/18/13 0632 02/18/13 0950  BP: 160/68  153/73   Pulse: 84  65   Temp: 97.6 F (36.4 C)  97.3 F (36.3 C)   TempSrc: Oral  Oral   Resp: 20  18   Height:      Weight:      SpO2: 97% 96% 95% 96%    Intake/Output Summary (Last 24 hours) at 02/18/13 1047 Last data filed at 02/18/13 0600  Gross per 24 hour  Intake    510 ml  Output   1420 ml  Net   -910 ml    Exam:   General:  Pt is alert, follows commands appropriately, not in acute distress  Cardiovascular: Regular rate and rhythm, S1/S2, no murmurs, no rubs, no gallops  Respiratory: Clear to auscultation bilaterally, no wheezing, minimal bibasilar crackles  Abdomen: Soft, non tender, non distended, bowel sounds present, no guarding  Extremities: Trace bilateral piiting edema, pulses DP and PT palpable bilaterally  Neuro: Grossly nonfocal  Data Reviewed: Basic Metabolic  Panel:  Recent Labs Lab 02/14/13 0210 02/15/13 0601 02/16/13 0553 02/17/13 0516 02/18/13 0550  NA 134* 136* 140 139 137  K 4.5 4.2 4.4 3.9 3.3*  CL 96 100 100 97 96  CO2 25 23 26 26 30   GLUCOSE 176* 141* 118* 134* 117*  BUN 22 27* 24* 19 18  CREATININE 0.71 0.85 0.82 0.76 0.82  CALCIUM 9.4 9.0 9.3 8.8 8.8   Liver Function Tests:  Recent Labs Lab 02/13/13 0249  AST 34  ALT 20  ALKPHOS 45  BILITOT 0.5  PROT 6.6  ALBUMIN 3.5   CBC:  Recent Labs Lab 02/12/13 2020 02/13/13 0249 02/14/13 0210 02/15/13 0601 02/16/13 0553  02/17/13 0516 02/18/13 0550  WBC 13.4* 16.0* 21.0* 14.1* 9.0 8.9 9.8  NEUTROABS 11.6* 15.1*  --   --   --   --   --   HGB 13.8 11.8* 12.5 12.5 13.3 13.1 14.3  HCT 41.3 36.7 37.8 37.4 39.8 39.3 42.3  MCV 92.0 91.5 91.1 91.2 91.3 91.6 89.6  PLT 248 219 227 256 286 294 315   Cardiac Enzymes:  Recent Labs Lab 02/13/13 1402 02/13/13 2002 02/14/13 0210 02/14/13 0806 02/14/13 1425  TROPONINI 0.47* 0.50* 0.52* 0.41* 0.36*   CBG:  Recent Labs Lab 02/14/13 1550  GLUCAP 123*    Recent Results (from the past 240 hour(s))  CULTURE, BLOOD (ROUTINE X 2)     Status: None   Collection Time    02/12/13  7:43 PM      Result Value Range Status   Specimen Description BLOOD RIGHT ARM   Final   Special Requests BOTTLES DRAWN AEROBIC AND ANAEROBIC 5CC   Final   Culture  Setup Time     Final   Value: 02/13/2013 01:27     Performed at [facility]   Culture     Final   Value:        BLOOD CULTURE RECEIVED NO GROWTH TO DATE CULTURE WILL BE HELD FOR 5 DAYS BEFORE ISSUING A FINAL NEGATIVE REPORT     Performed at [facility]   Report Status PENDING   Incomplete  CULTURE, BLOOD (ROUTINE X 2)     Status: None   Collection Time    02/12/13  8:20 PM      Result Value Range Status   Specimen Description BLOOD RIGHT HAND   Final   Special Requests BOTTLES DRAWN AEROBIC AND ANAEROBIC 5CC   Final   Culture  Setup Time     Final   Value: 02/13/2013 01:26     Performed at [facility]   Culture     Final   Value:        BLOOD CULTURE RECEIVED NO GROWTH TO DATE CULTURE WILL BE HELD FOR 5 DAYS BEFORE ISSUING A FINAL NEGATIVE REPORT     Performed at [facility]   Report Status PENDING   Incomplete  MRSA PCR SCREENING     Status: None   Collection Time    02/13/13  1:01 AM      Result Value Range Status   MRSA by PCR NEGATIVE  NEGATIVE Final   Comment:            The GeneXpert MRSA Assay (FDA     approved for NASAL specimens     only), is one component of a      comprehensive MRSA colonization     surveillance program. It is not  intended to diagnose MRSA     infection nor to guide or     monitor treatment for     MRSA infections.  URINE CULTURE     Status: None   Collection Time    02/14/13  6:53 AM      Result Value Range Status   Specimen Description URINE, RANDOM   Final   Special Requests NONE   Final   Culture  Setup Time     Final   Value: 02/14/2013 09:03     Performed at [place]     Final   Value: NO GROWTH     Performed at [facility]   Culture     Final   Value: NO GROWTH     Performed at [facility]   Report Status 02/15/2013 FINAL   Final     Scheduled Meds: . allopurinol  300 mg Oral QPM  . amitriptyline  50 mg Oral QHS  . amLODipine  5 mg Oral QPM  . aspirin EC  81 mg Oral Daily  . citalopram  20 mg Oral BID  . clopidogrel  75 mg Oral Q breakfast  . digoxin  0.125 mg Oral QPM  . docusate sodium  100 mg Oral BID  . donepezil  5 mg Oral Daily  . furosemide  80 mg Intravenous BID  . guaiFENesin  600 mg Oral BID  . ipratropium  0.5 mg Nebulization TID  . irbesartan  75 mg Oral Daily  . levalbuterol  0.63 mg Nebulization TID  . levofloxacin (LEVAQUIN) IV  750 mg Intravenous Q24H  . levothyroxine  25 mcg Oral QAC breakfast  . metoprolol tartrate  12.5 mg Oral BID  . pantoprazole  40 mg Oral Q1200  . polyethylene glycol  17 g Oral Daily  . potassium chloride SA  20 mEq Oral Daily  . predniSONE  30 mg Oral Q breakfast  . risperiDONE  0.25 mg Oral QHS  . sodium chloride  3 mL Intravenous Q12H  . Warfarin - Pharmacist Dosing Inpatient   Does not apply q1800   Continuous Infusions:   Faye Ramsay, MD  [facility] Pager [phone]  If 7PM-7AM, please contact night-coverage [email or web address] Password [email or web address] 02/18/2013, 10:47 AM   LOS: 6 days

## 2013-02-18 NOTE — Progress Notes (Signed)
ANTICOAGULATION CONSULT NOTE - Follow Up Consult  Pharmacy Consult for warfarin Indication: atrial fibrillation  Allergies  Allergen Reactions  . Ceclor [Cefaclor] Other (See Comments)    Reaction unknown  . Elastic Bandages & [Zinc] Other (See Comments)    Turns red on the areas it touches  . Lipitor [Atorvastatin Calcium] Other (See Comments)    Increased fibromyalgia pain  . Mevacor [Lovastatin] Other (See Comments)    Increased fibromyalgia pain  . Pravachol Other (See Comments)    Increased fibromyalgia pain  . Vasotec Other (See Comments)    Reaction unknown  . Adhesive [Tape] Itching and Rash    Burning   . Latex Itching and Rash    Burning     Patient Measurements: Height: 5\' 4"  (162.6 cm) Weight: 161 lb 3.2 oz (73.12 kg) IBW/kg (Calculated) : 54.7    Vital Signs: Temp: 97.3 F (36.3 C) (01/09 0632) Temp src: Oral (01/09 4166) BP: 153/73 mmHg (01/09 0632) Pulse Rate: 65 (01/09 0632)  Labs:  Recent Labs  02/16/13 0553 02/17/13 0516 02/18/13 0550  HGB 13.3 13.1 14.3  HCT 39.8 39.3 42.3  PLT 286 294 315  LABPROT 25.7* 25.1* 23.7*  INR 2.44* 2.37* 2.20*  CREATININE 0.82 0.76 0.82    Estimated Creatinine Clearance: 57.2 ml/min (by C-G formula based on Cr of 0.82).  Assessment: 79 yr female presents with complaints of shortness of breath - admitted with CAP and placed on Levaquin. She is on warfarin therapy for atrial fibrillation, pharmacy asked to dose warfarin while in hospital.  INR at time of admission was subtherapeutic @ 1.53.    Home dose: warfarin 7.5mg  daily except for 5 mg on Tue/Thur/Sat PTA.  Last visit to coumadin clinic, INR = 2.4 on above dose  INR remains therapeutic at 2.2.    CBC: Hgb, platelets WNL, no bleeding/complications reported.  Potential drug interactions with warfarin: Levofloxacin and corticosteroids - both may increase warfarin's effect.   Note patient is also on Plavix and ASA.  Goal of Therapy:  INR 2-3 Monitor  platelets by anticoagulation protocol: Yes   Plan:   Warfarin 5mg  PO x 1 tonight per home regimen   Daily INR.  Peggyann Juba, PharmD, BCPS Pager: [phone]  02/18/2013 11:06 AM

## 2013-02-18 NOTE — Progress Notes (Signed)
Physical Therapy Treatment Patient Details Name: DELAYNI STREED MRN: [id number] DOB: May 18, 1934 Today's Date: 02/18/2013 Time: 0922-0952 PT Time Calculation (min): 30 min  PT Assessment / Plan / Recommendation  History of Present Illness MIKAL WISMAN is a 79 y.o. female admitted with hypoxia and A fib.  Pt  has a history of CVA in which she was I prior to this admission with ADL activity   PT Comments   Assisted pt OOB to amb in hallway then practice going up/down 3 steps with one rail.  Follow Up Recommendations  Home health PT     Does the patient have the potential to tolerate intense rehabilitation     Barriers to Discharge        Equipment Recommendations  None recommended by PT (has 4WW at home)    Recommendations for Other Services    Frequency Min 3X/week   Progress towards PT Goals Progress towards PT goals: Progressing toward goals  Plan      Precautions / Restrictions Precautions Precautions: Fall Precaution Comments: Pt admits to hx of falls at home Required Braces or Orthoses: Other Brace/Splint Other Brace/Splint: Shoes with ankle support on L Restrictions Weight Bearing Restrictions: No    Pertinent Vitals/Pain No c/o pain    Mobility  Bed Mobility Overal bed mobility: Modified Independent General bed mobility comments: increased time Transfers Overall transfer level: Needs assistance Equipment used: 4-wheeled walker Transfers: Sit to/from Stand Sit to Stand: Supervision;Min guard General transfer comment: 25% VC's to push up from pull up from walker Ambulation/Gait Ambulation/Gait assistance: Min guard;Supervision Ambulation Distance (Feet): 225 Feet Assistive device: 4-wheeled walker Gait Pattern/deviations: Step-through pattern Gait velocity: decreased General Gait Details: increased time Stairs: Yes Stairs assistance: Min assist Stair Management: One rail Right Number of Stairs: 3 General stair comments: 50% VC's on proper sequencing  decending stairs    PT Goals (current goals can now be found in the care plan section)    Visit Information  Last PT Received On: 02/18/13 Assistance Needed: +1 History of Present Illness: HAYLIN CAMILLI is a 79 y.o. female admitted with hypoxia and A fib.  Pt  has a history of CVA in which she was I prior to this admission with ADL activity    Subjective Data      Cognition       Balance     End of Session PT - End of Session Equipment Utilized During Treatment: Gait belt Activity Tolerance: Patient tolerated treatment well Patient left: in chair;with call bell/phone within reach;with family/visitor present;with chair alarm set   Rica Koyanagi  PTA [facility] Pager      [phone]

## 2013-02-19 LAB — CULTURE, BLOOD (ROUTINE X 2)
CULTURE: NO GROWTH
Culture: NO GROWTH

## 2013-02-19 LAB — BASIC METABOLIC PANEL
BUN: 21 mg/dL (ref 6–23)
CALCIUM: 8.9 mg/dL (ref 8.4–10.5)
CO2: 34 meq/L — AB (ref 19–32)
CREATININE: 0.98 mg/dL (ref 0.50–1.10)
Chloride: 97 mEq/L (ref 96–112)
GFR calc Af Amer: 63 mL/min — ABNORMAL LOW (ref >90)
GFR, EST NON AFRICAN AMERICAN: 55 mL/min — AB (ref >90)
Glucose, Bld: 131 mg/dL — ABNORMAL HIGH (ref 70–99)
Potassium: 4.1 mEq/L (ref 3.7–5.3)
Sodium: 140 mEq/L (ref 137–147)

## 2013-02-19 LAB — CBC
HCT: 43.8 % (ref 36.0–46.0)
Hemoglobin: 14.6 g/dL (ref 12.0–15.0)
MCH: 30.6 pg (ref 26.0–34.0)
MCHC: 33.3 g/dL (ref 30.0–36.0)
MCV: 91.8 fL (ref 78.0–100.0)
Platelets: 325 10*3/uL (ref 150–400)
RBC: 4.77 MIL/uL (ref 3.87–5.11)
RDW: 14.5 % (ref 11.5–15.5)
WBC: 10.9 10*3/uL — ABNORMAL HIGH (ref 4.0–10.5)

## 2013-02-19 LAB — PROTIME-INR
INR: 1.9 — ABNORMAL HIGH (ref 0.00–1.49)
Prothrombin Time: 21.2 seconds — ABNORMAL HIGH (ref 11.6–15.2)

## 2013-02-19 MED ORDER — ALBUTEROL SULFATE HFA 108 (90 BASE) MCG/ACT IN AERS: 2.0000 | INHALATION_SPRAY | Freq: Four times a day (QID) | RESPIRATORY_TRACT | Status: DC | PRN

## 2013-02-19 MED ORDER — WARFARIN SODIUM 7.5 MG PO TABS
7.5000 mg | ORAL_TABLET | Freq: Once | ORAL | Status: DC
  Filled 2013-02-19: qty 1

## 2013-02-19 MED ORDER — FUROSEMIDE 80 MG PO TABS: 80.0000 mg | ORAL_TABLET | Freq: Two times a day (BID) | ORAL | Status: DC

## 2013-02-19 NOTE — Progress Notes (Signed)
Pt discharge instruction explained to patient and family (daughter, Therapist, sports). Home health [facility] with [facility]. Patient and family feel comfortable leaving and do not have any unanswered questions.

## 2013-02-19 NOTE — Discharge Instructions (Signed)

## 2013-02-19 NOTE — Progress Notes (Signed)
ANTICOAGULATION CONSULT NOTE - Follow Up Consult  Pharmacy Consult for warfarin Indication: atrial fibrillation  Allergies  Allergen Reactions  . Ceclor [Cefaclor] Other (See Comments)    Reaction unknown  . Elastic Bandages & [Zinc] Other (See Comments)    Turns red on the areas it touches  . Lipitor [Atorvastatin Calcium] Other (See Comments)    Increased fibromyalgia pain  . Mevacor [Lovastatin] Other (See Comments)    Increased fibromyalgia pain  . Pravachol Other (See Comments)    Increased fibromyalgia pain  . Vasotec Other (See Comments)    Reaction unknown  . Adhesive [Tape] Itching and Rash    Burning   . Latex Itching and Rash    Burning     Patient Measurements: Height: 5\' 4"  (162.6 cm) Weight: 161 lb 3.2 oz (73.12 kg) IBW/kg (Calculated) : 54.7    Vital Signs: Temp: 97.3 F (36.3 C) (01/10 0558) Temp src: Oral (01/10 0558) BP: 149/70 mmHg (01/10 0558) Pulse Rate: 57 (01/10 0558)  Labs:  Recent Labs  02/17/13 0516 02/18/13 0550 02/19/13 0520  HGB 13.1 14.3 14.6  HCT 39.3 42.3 43.8  PLT 294 315 325  LABPROT 25.1* 23.7* 21.2*  INR 2.37* 2.20* 1.90*  CREATININE 0.76 0.82 0.98    Estimated Creatinine Clearance: 47.9 ml/min (by C-G formula based on Cr of 0.98).  Assessment: 79 yr female presents with complaints of shortness of breath - admitted with CAP and placed on Levaquin. She is on warfarin therapy for atrial fibrillation, pharmacy asked to dose warfarin while in hospital.  INR at time of admission was subtherapeutic @ 1.53.    Home dose: warfarin 7.5mg  daily except for 5 mg on Tue/Thur/Sat.  Last visit to coumadin clinic, INR = 2.4 on above dose  INR just slightly subtherapeutic this am.    CBC: Hgb, platelets WNL, no bleeding/complications reported.  Potential drug interactions with warfarin: Levofloxacin and corticosteroids - both may increase warfarin's effect.   Note patient is also on Plavix and ASA.  Goal of Therapy:  INR  2-3 Monitor platelets by anticoagulation protocol: Yes   Plan:   Give warfarin 7.5mg  today at noon.   Daily INR.  Romeo Rabon, PharmD, pager [phone]. 02/19/2013,10:04 AM.

## 2013-02-19 NOTE — Discharge Summary (Signed)
Physician Discharge Summary  Amber [name] [id number] DOB: 04/11/1934 DOA: 02/12/2013  PCP: Gara Kroner, MD  Admit date: 02/12/2013 Discharge date: 02/19/2013  Recommendations for Outpatient Follow-up:  1. Pt will need to follow up with PCP in 2-3 weeks post discharge 2. Please obtain BMP to evaluate electrolytes and kidney function 3. Please also check CBC to evaluate Hg and Hct levels  Discharge Diagnoses: Strep PNA, CAP, atrial fibrillation  Active Problems:   Atrial fibrillation   Hypothyroidism   COPD (chronic obstructive pulmonary disease)   Diastolic heart failure secondary to hypertension   Streptococcal pneumonia   Long-term (current) use of anticoagulants   Delirium due to multiple etiologies   Steroid-induced psychosis  Discharge Condition: Stable  Diet recommendation: Heart healthy diet discussed in details   Brief narrative:  79 yo F with hx of CAD, HTN, HLD, CHF, A-fib, hypothyroidism, COPD, found to have S. Pneumo pneumonia, treating with levofloxacin. Had a-fib with RVR at presentation and HR rapidly improved, but troponins rose, likely demand ischemia/NSTEMI.   Assessment/Plan  A-fib with RVR  - rhythm still irregular but rate controlled and pt is clinically stable  - continue Coumadin per pharmacy  - also continue digoxin and metoprolol 12.5 mg BID  - no events on telemetry over 72 hours - pt is ready for discharge and cleared by cardiology team  Acute on chronic respiratory failure  - multifactorial and secondary to strep pneumo PNA imposed on COPD and acute on chronic diastolic CHF  - continue Levaquin day #7/7 - no need for ABX upon discharge  Strep Pneumo PNA - continue Levaquin day #7/7  Chronic COPD  - pt is clinically stable this AM and less congested on exam, maintaining oxygen saturation at target range  - continue Prednisone tapering, today is the last dose of Prednisone taper - no need for Prednisone upon discharge  Acute on chronic  diastolic CHF  - pt diuresed well on Lasix 80 mg IV and with only minimal crackles on exam this AM  - repeat CXR clear with no acute cardiopulmonary findings  - successfully transitioned to oral ABX  - weight trend since admission 167 --> 164 --> 161 lbs this AM  Elevated troponin  - likely demand ischemia in setting of atrial fibrillation with RVR and volume overload  - pt denies chest pain Dementia/depression  - stable  - continue aricept and celexa  HTN  - reasonable inpatient control  - Continue norvasc and ARB  Hypothyroidism  - continue Synthroid   Diet: Healthy heart  Proph: Coumadin  Code Status: full  Family Communication: patient and daughter at bedside   Consultants:  Cardiology Procedures:  01/05 CXR --> Stable changes in the left base. Continued followup is recommended.  01/08 CXR --> No active cardiopulmonary disease.  Antibiotics:  Ceftriaxone 1/4 x 1 dose  Azithromycin 1/4 x 1 dose  Levofloxacin 1/4 --> transition to PO 01/09 --> stop date 01/10  Discharge Exam: Filed Vitals:   02/19/13 0558  BP: 149/70  Pulse: 57  Temp: 97.3 F (36.3 C)  Resp: 18   Filed Vitals:   02/18/13 2035 02/18/13 2117 02/19/13 0558 02/19/13 0750  BP: 147/73  149/70   Pulse: 87 92 57   Temp: 97.8 F (36.6 C)  97.3 F (36.3 C)   TempSrc: Oral  Oral   Resp: 18 18 18    Height:      Weight:      SpO2: 96% 96% 97% 94%    General:  Pt is alert, follows commands appropriately, not in acute distress Cardiovascular: Irregular rate and rhythm, no rubs, no gallops Respiratory: Clear to auscultation bilaterally, no wheezing, no crackles, no rhonchi Abdominal: Soft, non tender, non distended, bowel sounds +, no guarding Extremities: trace bilateral LE pitting edema, no cyanosis, pulses palpable bilaterally DP and PT Neuro: Grossly nonfocal  Discharge Instructions  Discharge Orders   Future Appointments Provider Department Dept Phone   03/15/2013 12:15 PM [facility] [phone]   Future Orders Complete By Expires   Diet - low sodium heart healthy  As directed    Increase activity slowly  As directed        Medication List    STOP taking these medications       azithromycin 250 MG tablet  Commonly known as:  ZITHROMAX      TAKE these medications       albuterol 108 (90 BASE) MCG/ACT inhaler  Commonly known as:  PROVENTIL HFA;VENTOLIN HFA  Inhale 2 puffs into the lungs every 6 (six) hours as needed for wheezing or shortness of breath.     albuterol 108 (90 BASE) MCG/ACT inhaler  Commonly known as:  PROVENTIL HFA;VENTOLIN HFA  Inhale 2 puffs into the lungs every 6 (six) hours as needed. For shortness of breath     allopurinol 300 MG tablet  Commonly known as:  ZYLOPRIM  Take 300 mg by mouth every evening.     amitriptyline 25 MG tablet  Commonly known as:  ELAVIL  Take 50 mg by mouth at bedtime.     amLODipine 5 MG tablet  Commonly known as:  NORVASC  Take 5 mg by mouth every evening.     celecoxib 200 MG capsule  Commonly known as:  CELEBREX  Take 200 mg by mouth 2 (two) times daily.     citalopram 20 MG tablet  Commonly known as:  CELEXA  Take 1 tablet (20 mg total) by mouth 2 (two) times daily.     clopidogrel 75 MG tablet  Commonly known as:  PLAVIX  TAKE 1 TABLET BY MOUTH EVERY DAY     DAYQUIL PO  Take 1 tablet by mouth once.     digoxin 0.125 MG tablet  Commonly known as:  LANOXIN  Take 0.125 mg by mouth every evening.     donepezil 5 MG tablet  Commonly known as:  ARICEPT  Take 1 tablet (5 mg total) by mouth daily.     famotidine 20 MG tablet  Commonly known as:  PEPCID  Take 20 mg by mouth daily as needed for heartburn.     FOSAMAX 70 MG tablet  Generic drug:  alendronate  Take 70 mg by mouth every 7 (seven) days. Take with a full glass of water on an empty stomach. Sundays     furosemide 80 MG tablet  Commonly known as:  LASIX  Take 1 tablet (80 mg total) by mouth  2 (two) times daily. 2 tabs twice daily. May increase by 1 tablet twice daily if needed for fluid     KLOR-CON M20 20 MEQ tablet  Generic drug:  potassium chloride SA  TAKE 1 TABLET BY MOUTH TWICE A DAY     levothyroxine 25 MCG tablet  Commonly known as:  SYNTHROID, LEVOTHROID  Take 25 mcg by mouth daily.     metoprolol succinate 25 MG 24 hr tablet  Commonly known as:  TOPROL-XL  Take 25 mg by mouth once.  nitroGLYCERIN 0.4 MG SL tablet  Commonly known as:  NITROSTAT  Place 1 tablet (0.4 mg total) under the tongue every 5 (five) minutes as needed for chest pain.     valsartan 40 MG tablet  Commonly known as:  DIOVAN  Take 1 tablet (40 mg total) by mouth daily.     warfarin 5 MG tablet  Commonly known as:  COUMADIN  Take 5-7.5 mg by mouth See admin instructions. 1 tablet (5mg ) on Tues, Thurs, Sat. Then 1&1/2 (7.5mg ) on Sun, Mon, Wed, Fri (Patient takes in the evening)           Follow-up Information   Follow up with Gara Kroner, MD In 2 weeks.   Specialty:  Family Medicine   Contact information:   [address] [place] [address] [phone]       Follow up with Faye Ramsay, MD. (call my cell phone [phone])    Specialty:  Internal Medicine   Contact information:   [address] [place] [address] [phone]        The results of significant diagnostics from this hospitalization (including imaging, microbiology, ancillary and laboratory) are listed below for reference.     Microbiology: Recent Results (from the past 240 hour(s))  CULTURE, BLOOD (ROUTINE X 2)     Status: None   Collection Time    02/12/13  7:43 PM      Result Value Range Status   Specimen Description BLOOD RIGHT ARM   Final   Special Requests BOTTLES DRAWN AEROBIC AND ANAEROBIC 5CC   Final   Culture  Setup Time     Final   Value: 02/13/2013 01:27     Performed at [facility]   Culture     Final   Value:        BLOOD CULTURE RECEIVED  NO GROWTH TO DATE CULTURE WILL BE HELD FOR 5 DAYS BEFORE ISSUING A FINAL NEGATIVE REPORT     Performed at [facility]   Report Status PENDING   Incomplete  CULTURE, BLOOD (ROUTINE X 2)     Status: None   Collection Time    02/12/13  8:20 PM      Result Value Range Status   Specimen Description BLOOD RIGHT HAND   Final   Special Requests BOTTLES DRAWN AEROBIC AND ANAEROBIC 5CC   Final   Culture  Setup Time     Final   Value: 02/13/2013 01:26     Performed at [facility]   Culture     Final   Value:        BLOOD CULTURE RECEIVED NO GROWTH TO DATE CULTURE WILL BE HELD FOR 5 DAYS BEFORE ISSUING A FINAL NEGATIVE REPORT     Performed at [facility]   Report Status PENDING   Incomplete  MRSA PCR SCREENING     Status: None   Collection Time    02/13/13  1:01 AM      Result Value Range Status   MRSA by PCR NEGATIVE  NEGATIVE Final   Comment:            The GeneXpert MRSA Assay (FDA     approved for NASAL specimens     only), is one component of a     comprehensive MRSA colonization     surveillance program. It is not     intended to diagnose MRSA     infection nor to guide or  monitor treatment for     MRSA infections.  URINE CULTURE     Status: None   Collection Time    02/14/13  6:53 AM      Result Value Range Status   Specimen Description URINE, RANDOM   Final   Special Requests NONE   Final   Culture  Setup Time     Final   Value: 02/14/2013 09:03     Performed at Agency     Final   Value: NO GROWTH     Performed at [facility]   Culture     Final   Value: NO GROWTH     Performed at [facility]   Report Status 02/15/2013 FINAL   Final     Labs: Basic Metabolic Panel:  Recent Labs Lab 02/15/13 0601 02/16/13 0553 02/17/13 0516 02/18/13 0550 02/19/13 0520  NA 136* 140 139 137 140  K 4.2 4.4 3.9 3.3* 4.1  CL 100 100 97 96 97  CO2 23 26 26 30  34*  GLUCOSE 141* 118* 134* 117* 131*  BUN  27* 24* 19 18 21   CREATININE 0.85 0.82 0.76 0.82 0.98  CALCIUM 9.0 9.3 8.8 8.8 8.9   Liver Function Tests:  Recent Labs Lab 02/13/13 0249  AST 34  ALT 20  ALKPHOS 45  BILITOT 0.5  PROT 6.6  ALBUMIN 3.5   CBC:  Recent Labs Lab 02/12/13 2020 02/13/13 0249  02/15/13 0601 02/16/13 0553 02/17/13 0516 02/18/13 0550 02/19/13 0520  WBC 13.4* 16.0*  < > 14.1* 9.0 8.9 9.8 10.9*  NEUTROABS 11.6* 15.1*  --   --   --   --   --   --   HGB 13.8 11.8*  < > 12.5 13.3 13.1 14.3 14.6  HCT 41.3 36.7  < > 37.4 39.8 39.3 42.3 43.8  MCV 92.0 91.5  < > 91.2 91.3 91.6 89.6 91.8  PLT 248 219  < > 256 286 294 315 325  < > = values in this interval not displayed. Cardiac Enzymes:  Recent Labs Lab 02/13/13 1402 02/13/13 2002 02/14/13 0210 02/14/13 0806 02/14/13 1425  TROPONINI 0.47* 0.50* 0.52* 0.41* 0.36*   BNP: BNP (last 3 results)  Recent Labs  02/12/13 2020 02/14/13 0806  PROBNP 1485.0* 5472.0*   CBG:  Recent Labs Lab 02/14/13 1550  GLUCAP 123*   SIGNED: Time coordinating discharge: Over 30 minutes  Faye Ramsay, MD  [facility] 02/19/2013, 9:38 AM Pager [phone]  If 7PM-7AM, please contact night-coverage [email or web address] Password [email or web address]

## 2013-02-23 ENCOUNTER — Encounter: Payer: Medicare Other | Admitting: Cardiology

## 2013-02-25 ENCOUNTER — Other Ambulatory Visit: Payer: Self-pay | Admitting: Cardiology

## 2013-02-28 ENCOUNTER — Encounter: Payer: Self-pay | Admitting: Nurse Practitioner

## 2013-02-28 ENCOUNTER — Other Ambulatory Visit: Payer: Self-pay | Admitting: *Deleted

## 2013-02-28 ENCOUNTER — Ambulatory Visit (INDEPENDENT_AMBULATORY_CARE_PROVIDER_SITE_OTHER): Payer: Medicare Other | Admitting: Nurse Practitioner

## 2013-02-28 ENCOUNTER — Ambulatory Visit (INDEPENDENT_AMBULATORY_CARE_PROVIDER_SITE_OTHER): Payer: Medicare Other | Admitting: *Deleted

## 2013-02-28 VITALS — BP 135/68 | HR 60 | Ht 64.0 in | Wt 158.0 lb

## 2013-02-28 DIAGNOSIS — I6350 Cerebral infarction due to unspecified occlusion or stenosis of unspecified cerebral artery: Secondary | ICD-10-CM

## 2013-02-28 DIAGNOSIS — N390 Urinary tract infection, site not specified: Secondary | ICD-10-CM

## 2013-02-28 DIAGNOSIS — L8990 Pressure ulcer of unspecified site, unspecified stage: Secondary | ICD-10-CM

## 2013-02-28 DIAGNOSIS — I5032 Chronic diastolic (congestive) heart failure: Secondary | ICD-10-CM

## 2013-02-28 DIAGNOSIS — R799 Abnormal finding of blood chemistry, unspecified: Secondary | ICD-10-CM

## 2013-02-28 DIAGNOSIS — R778 Other specified abnormalities of plasma proteins: Secondary | ICD-10-CM

## 2013-02-28 DIAGNOSIS — R7989 Other specified abnormal findings of blood chemistry: Secondary | ICD-10-CM

## 2013-02-28 DIAGNOSIS — I4891 Unspecified atrial fibrillation: Secondary | ICD-10-CM

## 2013-02-28 DIAGNOSIS — I2510 Atherosclerotic heart disease of native coronary artery without angina pectoris: Secondary | ICD-10-CM

## 2013-02-28 DIAGNOSIS — I10 Essential (primary) hypertension: Secondary | ICD-10-CM

## 2013-02-28 LAB — URINALYSIS, ROUTINE W REFLEX MICROSCOPIC
Bilirubin Urine: NEGATIVE
Hgb urine dipstick: NEGATIVE
Ketones, ur: NEGATIVE
Nitrite: NEGATIVE
RBC / HPF: NONE SEEN (ref 0–?)
SPECIFIC GRAVITY, URINE: 1.015 (ref 1.000–1.030)
Total Protein, Urine: NEGATIVE
Urine Glucose: NEGATIVE
Urobilinogen, UA: 0.2 (ref 0.0–1.0)
pH: 6 (ref 5.0–8.0)

## 2013-02-28 LAB — BASIC METABOLIC PANEL
BUN: 12 mg/dL (ref 6–23)
CHLORIDE: 102 meq/L (ref 96–112)
CO2: 30 mEq/L (ref 19–32)
Calcium: 8.7 mg/dL (ref 8.4–10.5)
Creatinine, Ser: 0.9 mg/dL (ref 0.4–1.2)
GFR: 68.12 mL/min (ref >60.00)
Glucose, Bld: 103 mg/dL — ABNORMAL HIGH (ref 70–99)
POTASSIUM: 4.4 meq/L (ref 3.5–5.1)
Sodium: 140 mEq/L (ref 135–145)

## 2013-02-28 LAB — POCT INR: INR: 2.8

## 2013-02-28 LAB — CBC WITH DIFFERENTIAL/PLATELET
Basophils Absolute: 0 10*3/uL (ref 0.0–0.1)
Basophils Relative: 0.4 % (ref 0.0–3.0)
EOS ABS: 0.3 10*3/uL (ref 0.0–0.7)
Eosinophils Relative: 3.1 % (ref 0.0–5.0)
HCT: 37.9 % (ref 36.0–46.0)
Hemoglobin: 12.5 g/dL (ref 12.0–15.0)
LYMPHS PCT: 21.3 % (ref 12.0–46.0)
Lymphs Abs: 1.8 10*3/uL (ref 0.7–4.0)
MCHC: 33.1 g/dL (ref 30.0–36.0)
MCV: 92.7 fl (ref 78.0–100.0)
MONO ABS: 0.7 10*3/uL (ref 0.1–1.0)
Monocytes Relative: 8.3 % (ref 3.0–12.0)
Neutro Abs: 5.7 10*3/uL (ref 1.4–7.7)
Neutrophils Relative %: 66.9 % (ref 43.0–77.0)
PLATELETS: 281 10*3/uL (ref 150.0–400.0)
RBC: 4.09 Mil/uL (ref 3.87–5.11)
RDW: 15.6 % — ABNORMAL HIGH (ref 11.5–14.6)
WBC: 8.5 10*3/uL (ref 4.5–10.5)

## 2013-02-28 NOTE — Patient Instructions (Addendum)
You have been referred to [facility]   Your physician recommends that you schedule a follow-up appointment in: 3-4 WEEKS WITH DR. Mare Ferrari  Your physician has requested that you have a lexiscan myoview. Please follow instruction sheet, as given.  Your physician recommends that you return for lab work in: TODAY (CBC, BMET, URINALYSIS)  Your physician recommends that you continue on your current medications as directed. Please refer to the Current Medication list given to you today.

## 2013-02-28 NOTE — Progress Notes (Signed)
Patient Name: Amber [name] Date of Encounter: 02/28/2013  Primary Care Provider:  Gara Kroner, [name] Primary Cardiologist:  Amber Browner, [name]   Patient Profile  [age] female with history of chronic atrial fibrillation CAD, and diastolic heart failure, who presents for followup after admission for pneumonia and heart failure.  Problem List   Past Medical History  Diagnosis Date  . Coronary artery disease     a. s/p CABG;  b. 04/2010 Neg MV  . Hypertension   . Hyperlipidemia   . Hypothyroidism   . Depression   . Insomnia   . Fibromyalgia   . Gout   . Osteoarthritis   . Osteoporosis   . Cervical spinal stenosis   . Nasal polyposis   . Pneumonia     1990  . COPD with emphysema     PFT 05/02/10>>FEV1 1.35(62%), FEV1% 66, DLCO 75%  . Chronic diastolic CHF (congestive heart failure)     a. 02/2013 Echo: EF 55-60%, mild AI, mod dil LA.  [name] Atrial fibrillation     a. chronic/rate controlled;  b. chronic coumadin.  . CVA (cerebral vascular accident)     left sided weakness  . GERD (gastroesophageal reflux disease)     pepcid 2-3 times per week  . Intermittent confusion   . History of melanoma     squamous cell, melanoma   Past Surgical History  Procedure Laterality Date  . Abdominal hysterectomy    . Breast lumpectomy  1980s    Benign lesion - right  . Carpel tunnel      right  . Tonsillectomy    . Eye surgery      bilateral cataracts  . Coronary artery bypass graft  2000  . Radiology with anesthesia  12/24/2011    Procedure: RADIOLOGY WITH ANESTHESIA;  Surgeon: Amber [name];  Location: [place];  Service: Radiology;  Laterality: N/A;  Extra Cranial Vascular Stent    Allergies  Allergies  Allergen Reactions  . Ceclor [Cefaclor] Other (See Comments)    Reaction unknown  . Elastic Bandages & [Zinc] Other (See Comments)    Turns red on the areas it touches  . Lipitor [Atorvastatin Calcium] Other (See Comments)    Increased fibromyalgia pain  .  Mevacor [Lovastatin] Other (See Comments)    Increased fibromyalgia pain  . Pravachol Other (See Comments)    Increased fibromyalgia pain  . Vasotec Other (See Comments)    Reaction unknown  . Adhesive [Tape] Itching and Rash    Burning   . Latex Itching and Rash    Burning     HPI  37 she'll female with the above complex problem list.  She was recently admitted to [facility] with pneumonia and also found to have A. Fib with RVR.  In that setting, she had diastolic heart failure requiring IV diuresis.  She had mild, flat troponin elevations which were felt to be demand ischemia in the setting of respiratory failure rapid A. Fib with recommendation for outpatient Myoview.  Echocardiogram was performed during admission and showed normal LV function.  Symptomatic improvement, her rates also improved.  She was effectively diuresed with a discharge weight of 161 pounds.  Since her discharge, she has done well without any significant dyspnea, PND, orthopnea, dizziness, syncope, edema, or early satiety.  Her weight at home has been 158 pounds, which is in the office today as well.  She is not experiencing any chest pain.  She denies any palpitations or tachycardia.  Over the past few days, she has noted a foul odor to her urine and just this morning, her daughter noted some cracking and ulceration to her bilateral heels and great toes.  Amber [name].  Home Medications  Prior to Admission medications   Amber Sig Start Date End Date Taking? Authorizing Provider  albuterol (PROVENTIL HFA;VENTOLIN HFA) 108 (90 BASE) MCG/ACT inhaler Inhale 2 puffs into the lungs every 6 (six) hours as needed for wheezing or shortness of breath. 02/19/13  Yes Amber [name]  alendronate (FOSAMAX) 70 MG tablet Take 70 mg by mouth every 7 (seven) days. Take with a full glass of water on an empty stomach. Sundays   Yes Historical Provider, [name]  allopurinol (ZYLOPRIM) 300 MG tablet Take 300 mg by  mouth every evening.   Yes Historical Provider, [name]  amitriptyline (ELAVIL) 25 MG tablet Take 50 mg by mouth at bedtime.   Yes Historical Provider, [name]  amLODipine (NORVASC) 5 MG tablet Take 5 mg by mouth every evening.  06/04/12  Yes Amber Coco, [name]  celecoxib (CELEBREX) 200 MG capsule Take 200 mg by mouth 2 (two) times daily.     Yes Historical Provider, [name]  citalopram (CELEXA) 20 MG tablet Take 1 tablet (20 mg total) by mouth 2 (two) times daily. 12/14/12  Yes Amber Coco, [name]  clopidogrel (PLAVIX) 75 MG tablet TAKE 1 TABLET BY MOUTH EVERY DAY 01/19/13  Yes Amber Coco, [name]  digoxin (LANOXIN) 0.125 MG tablet Take 0.125 mg by mouth every evening.  09/02/12  Yes Amber Coco, [name]  donepezil (ARICEPT) 5 MG tablet Take 1 tablet (5 mg total) by mouth daily. 08/10/12  Yes Amber Age, [name]  famotidine (PEPCID) 20 MG tablet Take 20 mg by mouth daily as needed for heartburn.    Yes Historical Provider, [name]  furosemide (LASIX) 80 MG tablet Take 80 mg by mouth 2 (two) times daily. 02/19/13  Yes Amber [name]  KLOR-CON M20 20 MEQ tablet TAKE 1 TABLET BY MOUTH TWICE A DAY 12/28/12  Yes Amber Coco, [name]  levothyroxine (SYNTHROID, LEVOTHROID) 25 MCG tablet Take 25 mcg by mouth daily.   Yes Historical Provider, [name]  metoprolol succinate (TOPROL-XL) 25 MG 24 hr tablet Take 25 mg by mouth once. 06/18/12  Yes Amber Coco, [name]  nitroGLYCERIN (NITROSTAT) 0.4 MG SL tablet Place 1 tablet (0.4 mg total) under the tongue every 5 (five) minutes as needed for chest pain. 09/13/12  Yes Amber Coco, [name]  Pseudoephedrine-APAP-DM (DAYQUIL PO) Take 1 tablet by mouth once.   Yes Historical Provider, [name]  valsartan (DIOVAN) 40 MG tablet Take 1 tablet (40 mg total) by mouth daily. 09/24/12  Yes Amber Coco, [name]  warfarin (COUMADIN) 5 MG tablet Take 5-7.5 mg by mouth as directed.    Yes Historical Provider, [name]    Family History  Family History  Problem Relation [name] of Onset  . Stroke Mother   . CAD Father    . Diabetes type II Brother   . CAD Brother     Social History  History   Social History  . Marital Status: Married    Spouse Name: N/A    Number of Children: N/A  . Years of Education: N/A   Occupational History  . [employer]   . Secretary     Retired   Social History Main Topics  . Smoking status: Former Smoker -- 1.50 packs/day for 25 years    Types: Cigarettes  Quit date: 02/10/1977  . Smokeless tobacco: Never Used  . Alcohol Use: No  . Drug Use: No  . Sexual Activity: Not on file   Other Topics Concern  . Not on file   Social History Narrative  . No narrative on file     Review of Systems General:  No chills, fever, night sweats or weight changes.  Cardiovascular:  No chest pain, dyspnea on exertion, edema, orthopnea, palpitations, paroxysmal nocturnal dyspnea. Dermatological: No rash, lesions/masses Respiratory: No cough, dyspnea Urologic: No hematuria, dysuria Abdominal:   No nausea, vomiting, diarrhea, bright red blood per rectum, melena, or hematemesis Neurologic:  No visual changes, wkns, changes in mental status. GU:  Malodorous urine. Integ: skin breakdown to bilat heels and great toes. All other systems reviewed and are otherwise negative except as noted above.  Physical Exam  Blood pressure 135/68, pulse 60, height 5\' 4"  (1.626 m), weight 158 lb (71.668 kg).  General: Pleasant, NAD Psych: Normal affect. Neuro: Alert and oriented X 3. Moves all extremities spontaneously. HEENT: Normal  Neck: Supple without bruits or JVD. Lungs:  Resp regular and unlabored, CTA. Heart: IR, IR, 2/6 SEM RUSB, no s3, s4. Abdomen: Soft, non-tender, non-distended, BS + x 4.  Extremities: No clubbing, cyanosis.  Trace bilat LE edema. DP/PT/Radials 1+ and equal bilaterally.  Her heels are dressed bilat without drainage.  Accessory Clinical Findings  Cbc, bmet, UA, cardiolite pending.  Assessment & Plan  1.  Chronic diastolic congestive heart  failure: Patient had significant volume overload in the setting of respiratory failure and rapid A. Fib during her hospitalization.  Her weight has been stable since discharge.  Her blood pressure and heart rate are well controlled.  She remains on Lasix 80 mg b.i.d. And we will check a basic metabolic panel today.  That was her previous home dose of Lasix as well.  2.  Recent pneumonia: Patient is to use albuterol just a few times since discharge and is currently feeling well.  She's had no fevers.  As recommended at discharge, we will followup a CBC today.  3.  Hypertension: Stable.  4.  Coronary artery disease with recent troponin elevation: Patient does not have any chest pain.  She had echocardiogram during hospitalization which showed normal LV function without wall motion abnormalities.  Outpatient stress testing was recommended.  We will arrange for a lexiscan cardiolite.  Continue Plavix, beta blocker, prn nitrates.  No asa in setting of ongoing coumadin.  5.  Afib:  Rate controlled on bb/digoxin.  Cont coumadin.  6.  Pressure ulcers:  Of heels/great toes.  Pt says that these were not present prior to her most recent hospitalization.  Areas are currently dressed w/o drainage.  She's had no fevers.  Checking cbc today.  Will make [name] to wound clinic as soon as possible.  7.  Malodorous urine:  Recently noted.  Will check UA.  8.  Dispo:  Testing as above.  F/U Dr. Mare Ferrari 3-4 wks.  Murray Hodgkins, NP 02/28/2013, 11:56 AM

## 2013-03-07 ENCOUNTER — Other Ambulatory Visit: Payer: Self-pay | Admitting: Cardiology

## 2013-03-09 ENCOUNTER — Other Ambulatory Visit: Payer: Self-pay | Admitting: Cardiology

## 2013-03-14 ENCOUNTER — Encounter (HOSPITAL_COMMUNITY): Payer: Medicare Other

## 2013-03-15 ENCOUNTER — Encounter (HOSPITAL_BASED_OUTPATIENT_CLINIC_OR_DEPARTMENT_OTHER): Payer: Medicare Other | Attending: General Surgery

## 2013-03-19 ENCOUNTER — Other Ambulatory Visit: Payer: Self-pay | Admitting: Cardiology

## 2013-03-23 ENCOUNTER — Encounter (INDEPENDENT_AMBULATORY_CARE_PROVIDER_SITE_OTHER): Payer: Medicare Other

## 2013-03-23 ENCOUNTER — Ambulatory Visit (HOSPITAL_COMMUNITY): Payer: Medicare Other | Attending: Cardiology | Admitting: Radiology

## 2013-03-23 VITALS — BP 154/78 | Ht 64.0 in | Wt 168.0 lb

## 2013-03-23 DIAGNOSIS — J4489 Other specified chronic obstructive pulmonary disease: Secondary | ICD-10-CM | POA: Insufficient documentation

## 2013-03-23 DIAGNOSIS — I4891 Unspecified atrial fibrillation: Secondary | ICD-10-CM | POA: Insufficient documentation

## 2013-03-23 DIAGNOSIS — Z8249 Family history of ischemic heart disease and other diseases of the circulatory system: Secondary | ICD-10-CM | POA: Insufficient documentation

## 2013-03-23 DIAGNOSIS — R0602 Shortness of breath: Secondary | ICD-10-CM

## 2013-03-23 DIAGNOSIS — J449 Chronic obstructive pulmonary disease, unspecified: Secondary | ICD-10-CM | POA: Insufficient documentation

## 2013-03-23 DIAGNOSIS — I509 Heart failure, unspecified: Secondary | ICD-10-CM | POA: Insufficient documentation

## 2013-03-23 DIAGNOSIS — E785 Hyperlipidemia, unspecified: Secondary | ICD-10-CM | POA: Insufficient documentation

## 2013-03-23 DIAGNOSIS — Z87891 Personal history of nicotine dependence: Secondary | ICD-10-CM | POA: Insufficient documentation

## 2013-03-23 DIAGNOSIS — R778 Other specified abnormalities of plasma proteins: Secondary | ICD-10-CM

## 2013-03-23 DIAGNOSIS — Z951 Presence of aortocoronary bypass graft: Secondary | ICD-10-CM | POA: Insufficient documentation

## 2013-03-23 DIAGNOSIS — I739 Peripheral vascular disease, unspecified: Secondary | ICD-10-CM | POA: Insufficient documentation

## 2013-03-23 DIAGNOSIS — I779 Disorder of arteries and arterioles, unspecified: Secondary | ICD-10-CM | POA: Insufficient documentation

## 2013-03-23 DIAGNOSIS — I10 Essential (primary) hypertension: Secondary | ICD-10-CM | POA: Insufficient documentation

## 2013-03-23 DIAGNOSIS — I6350 Cerebral infarction due to unspecified occlusion or stenosis of unspecified cerebral artery: Secondary | ICD-10-CM

## 2013-03-23 DIAGNOSIS — R7989 Other specified abnormal findings of blood chemistry: Secondary | ICD-10-CM

## 2013-03-23 MED ORDER — TECHNETIUM TC 99M SESTAMIBI GENERIC - CARDIOLITE
10.0000 | Freq: Once | INTRAVENOUS | Status: AC | PRN
  Administered 2013-03-23: 10 via INTRAVENOUS

## 2013-03-23 MED ORDER — TECHNETIUM TC 99M SESTAMIBI GENERIC - CARDIOLITE
30.0000 | Freq: Once | INTRAVENOUS | Status: AC | PRN
  Administered 2013-03-23: 30 via INTRAVENOUS

## 2013-03-23 MED ORDER — REGADENOSON 0.4 MG/5ML IV SOLN
0.4000 mg | Freq: Once | INTRAVENOUS | Status: AC
  Administered 2013-03-23: 0.4 mg via INTRAVENOUS

## 2013-03-23 NOTE — Progress Notes (Signed)
[facility] [address] [place], [place] [address] [phone]    Cardiology Nuclear Med Study  Amber [name] is a 79 y.o. female     MRN : [id number]     DOB: [birth year]  Procedure Date: 03/23/2013  Nuclear Med Background Indication for Stress Test:  Evaluation for Ischemia, Graft Patency and Post Hospital A-Fib, Pneumonia History:  COPD, Emphysema and '00 Cath-CABG-AFIB-CHF-ICM 6/12 Cardioversion,08/24/12 MPI: Scar Not Gated fixed defect inferior 1/15 ECHO: EF: 55-60%  Cardiac Risk Factors: Carotid Disease, CVA, Family History - CAD, History of Smoking, Hypertension, Lipids and PVD  Symptoms:  SOB   Nuclear Pre-Procedure Caffeine/Decaff Intake:  None NPO After: 8:00pm   Lungs:  clear O2 Sat: 96% on room air. IV 0.9% NS with Angio Cath:  22g  IV Site: R Hand  IV Started by:  Crissie Figures, RN  Chest Size (in):  36 Cup Size: C  Height: 5\' 4"  (1.626 m)  Weight:  168 lb (76.204 kg)  BMI:  Body mass index is 28.82 kg/(m^2). Tech Comments:  N/A    Nuclear Med Study 1 or 2 day study: 1 day  Stress Test Type:  Lexiscan  Reading MD: N/A  Order Authorizing Provider:  Darlin Coco, MD  Resting Radionuclide: Technetium 69m Sestamibi  Resting Radionuclide Dose: 11.0 mCi   Stress Radionuclide:  Technetium 49m Sestamibi  Stress Radionuclide Dose: 33.0 mCi           Stress Protocol Rest HR: 65 Stress HR: 73  Rest BP: 154/78 Stress BP: 164/69  Exercise Time (min): n/a METS: n/a   Predicted Max HR: 144 bpm % Max HR: 50.69 bpm Rate Pressure Product: 11972   Dose of Adenosine (mg):  n/a Dose of Lexiscan: 0.4 mg  Dose of Atropine (mg): n/a Dose of Dobutamine: n/a mcg/kg/min (at max HR)  Stress Test Technologist: Perrin Maltese, EMT-P  Nuclear Technologist:  Annye Rusk, CNMT     Rest Procedure:  Myocardial perfusion imaging was performed at rest 45 minutes following the intravenous administration of Technetium 54m Sestamibi. Rest ECG: Atrial  fibrillation. Diffuse nonspecific ST-T wave changes.  Stress Procedure:  The patient received IV Lexiscan 0.4 mg over 15-seconds.  Technetium 45m Sestamibi injected at 30-seconds. This patient had throat tightness, sob, wooziness, and abdominal tightness with the Lexiscan injection. Quantitative spect images were obtained after a 45 minute delay. Stress ECG: No significant change from baseline ECG  QPS Raw Data Images:  Normal; no motion artifact; normal heart/lung ratio. Stress Images:  There is a medium-sized defect with moderately severe decreased uptake affecting the base/mid/apical inferior segments and the base/mid inferolateral segments. This defect is partially reversible Rest Images:  Small defect with mild decreased uptake affecting the mid and apical inferior segments. Subtraction (SDS):  There is ischemia present in the inferior and inferolateral walls. Transient Ischemic Dilatation (Normal <1.22):  1.06 Lung/Heart Ratio (Normal <0.45):  0.37  Quantitative Gated Spect Images QGS EDV:  n/a ml QGS ESV:  n/a ml  Impression Exercise Capacity:  Lexiscan with no exercise. BP Response:  Normal blood pressure response. Clinical Symptoms:  Chest tightness ECG Impression:  No significant ST segment change suggestive of ischemia. Comparison with Prior Nuclear Study: No images to compare  Overall Impression:  The risk of this scan cannot be given because there is no wall motion data. There is a medium-sized area of moderate ischemia affecting the inferior wall in the inferolateral wall. There may be mild scar in  the inferior wall.  LV Ejection Fraction: study not gated.  LV Wall Motion:  Wall motion data is not available. The study could not be gated because of the atrial fibrillation.  Dola Argyle, MD

## 2013-04-01 ENCOUNTER — Ambulatory Visit (INDEPENDENT_AMBULATORY_CARE_PROVIDER_SITE_OTHER): Payer: Medicare Other | Admitting: Pharmacist

## 2013-04-01 ENCOUNTER — Ambulatory Visit (INDEPENDENT_AMBULATORY_CARE_PROVIDER_SITE_OTHER): Payer: Medicare Other | Admitting: Cardiology

## 2013-04-01 ENCOUNTER — Encounter: Payer: Self-pay | Admitting: Cardiology

## 2013-04-01 VITALS — BP 147/66 | HR 62 | Ht 64.0 in | Wt 160.0 lb

## 2013-04-01 DIAGNOSIS — I4891 Unspecified atrial fibrillation: Secondary | ICD-10-CM

## 2013-04-01 DIAGNOSIS — I6350 Cerebral infarction due to unspecified occlusion or stenosis of unspecified cerebral artery: Secondary | ICD-10-CM

## 2013-04-01 DIAGNOSIS — E785 Hyperlipidemia, unspecified: Secondary | ICD-10-CM

## 2013-04-01 DIAGNOSIS — Z951 Presence of aortocoronary bypass graft: Secondary | ICD-10-CM

## 2013-04-01 DIAGNOSIS — I2510 Atherosclerotic heart disease of native coronary artery without angina pectoris: Secondary | ICD-10-CM

## 2013-04-01 DIAGNOSIS — I5032 Chronic diastolic (congestive) heart failure: Secondary | ICD-10-CM

## 2013-04-01 DIAGNOSIS — Z79899 Other long term (current) drug therapy: Secondary | ICD-10-CM

## 2013-04-01 LAB — CBC WITH DIFFERENTIAL/PLATELET
Basophils Absolute: 0 10*3/uL (ref 0.0–0.1)
Basophils Relative: 0.5 % (ref 0.0–3.0)
Eosinophils Absolute: 0.2 10*3/uL (ref 0.0–0.7)
Eosinophils Relative: 2.4 % (ref 0.0–5.0)
HCT: 42.5 % (ref 36.0–46.0)
Hemoglobin: 13.7 g/dL (ref 12.0–15.0)
Lymphocytes Relative: 26.1 % (ref 12.0–46.0)
Lymphs Abs: 2 10*3/uL (ref 0.7–4.0)
MCHC: 32.2 g/dL (ref 30.0–36.0)
MCV: 94.5 fl (ref 78.0–100.0)
MONO ABS: 0.6 10*3/uL (ref 0.1–1.0)
Monocytes Relative: 8.4 % (ref 3.0–12.0)
NEUTROS PCT: 62.6 % (ref 43.0–77.0)
Neutro Abs: 4.7 10*3/uL (ref 1.4–7.7)
PLATELETS: 264 10*3/uL (ref 150.0–400.0)
RBC: 4.5 Mil/uL (ref 3.87–5.11)
RDW: 16.1 % — ABNORMAL HIGH (ref 11.5–14.6)
WBC: 7.5 10*3/uL (ref 4.5–10.5)

## 2013-04-01 LAB — BASIC METABOLIC PANEL
BUN: 12 mg/dL (ref 6–23)
CALCIUM: 9.1 mg/dL (ref 8.4–10.5)
CO2: 31 meq/L (ref 19–32)
Chloride: 100 mEq/L (ref 96–112)
Creatinine, Ser: 0.9 mg/dL (ref 0.4–1.2)
GFR: 64.62 mL/min (ref >60.00)
Glucose, Bld: 107 mg/dL — ABNORMAL HIGH (ref 70–99)
Potassium: 4.1 mEq/L (ref 3.5–5.1)
Sodium: 137 mEq/L (ref 135–145)

## 2013-04-01 LAB — TSH: TSH: 0.56 u[IU]/mL (ref 0.35–5.50)

## 2013-04-01 LAB — HEPATIC FUNCTION PANEL
ALBUMIN: 4.1 g/dL (ref 3.5–5.2)
ALT: 17 U/L (ref 0–35)
AST: 29 U/L (ref 0–37)
Alkaline Phosphatase: 45 U/L (ref 39–117)
Bilirubin, Direct: 0 mg/dL (ref 0.0–0.3)
TOTAL PROTEIN: 6.9 g/dL (ref 6.0–8.3)
Total Bilirubin: 0.6 mg/dL (ref 0.3–1.2)

## 2013-04-01 LAB — POCT INR: INR: 3.3

## 2013-04-01 LAB — LIPID PANEL
CHOLESTEROL: 378 mg/dL — AB (ref 0–200)
HDL: 39.7 mg/dL (ref >39.00)
Total CHOL/HDL Ratio: 10
Triglycerides: 386 mg/dL — ABNORMAL HIGH (ref 0.0–149.0)
VLDL: 77.2 mg/dL — ABNORMAL HIGH (ref 0.0–40.0)

## 2013-04-01 LAB — T4, FREE: Free T4: 0.75 ng/dL (ref 0.60–1.60)

## 2013-04-01 LAB — LDL CHOLESTEROL, DIRECT: Direct LDL: 245.5 mg/dL

## 2013-04-01 NOTE — Assessment & Plan Note (Signed)
The patient has had no new TIA or stroke symptoms.  She remains on Plavix and Coumadin.

## 2013-04-01 NOTE — Progress Notes (Signed)
Amber [name] Date of Birth:  1934/07/22 [address] Amber [name], [place]  [address] [phone]         Fax   [phone]  History of Present Illness: This pleasant 79 year old woman is seen for a scheduled followup office visit.  Since we last saw her she was hospitalized 02/12/13 until 02/19/13 for acute pneumococcal pneumonia.  She has made a good recovery from the pneumonia and feels well at the present time. She has a complex past medical history. She has known ischemic heart disease with coronary artery bypass graft surgery in 2000. She has had remote stroke with residual left hemiparesis. She's had a long history of high blood pressure and high cholesterol. She has established atrial fibrillation and is on Coumadin. She has chronically elevated CK levels and she has a history of fibromyalgia.  She is followed for this by Dr. Lenna Gilford, rheumatologist with [facility].  She does not tolerate statins. She's had congestive heart failure secondary to diastolic dysfunction.  She was cardioverted in June 2012 but remained in sinus rhythm for less than one week. She is now in established chronic atrial fibrillation on Coumadin.  The patient has a history of COPD. She recently saw Dr. Halford Chessman who felt that most of her dyspnea was probably related to worsening congestive heart failure rather than her lung disease. A chest x-ray on 08/06/12 showed x-ray findings of mild CHF. The patient has improved since increasing her furosemide to a total daily dose of 160 mg. previously we had switched her from Diovan to losartan because of cost .  However, she did not do as well and we switched her back to Diovan and she feels better .  The patient still has very little stamina when she walks. Since last visit the patient had an echocardiogram on 08/12/12 showing an ejection fraction of 60-65%.  She also had a Lexa scan Myoview on 08/25/12 which showed no evidence of ischemia and she does have  an old inferior wall myocardial infarction scar.  She had a repeat Lexi scan Myoview on 03/23/13 which showed findings similar to July 2014.  There is inferior wall scar with surrounding peri-infarct ischemia.  Current Outpatient Prescriptions  Medication Sig Dispense Refill  . albuterol (PROVENTIL HFA;VENTOLIN HFA) 108 (90 BASE) MCG/ACT inhaler Inhale 2 puffs into the lungs every 6 (six) hours as needed for wheezing or shortness of breath.  3 Inhaler  11  . alendronate (FOSAMAX) 70 MG tablet Take 70 mg by mouth every 7 (seven) days. Take with a full glass of water on an empty stomach. Sundays      . allopurinol (ZYLOPRIM) 300 MG tablet Take 300 mg by mouth every evening.      [name] allopurinol (ZYLOPRIM) 300 MG tablet TAKE 1 TABLET EVERY DAY  90 tablet  0  . amitriptyline (ELAVIL) 25 MG tablet Take 50 mg by mouth at bedtime.      [name] amLODipine (NORVASC) 5 MG tablet Take 5 mg by mouth every evening.       . celecoxib (CELEBREX) 200 MG capsule Take 200 mg by mouth 2 (two) times daily.        . citalopram (CELEXA) 20 MG tablet Take 1 tablet (20 mg total) by mouth 2 (two) times daily.  60 tablet  5  . clopidogrel (PLAVIX) 75 MG tablet TAKE 1 TABLET BY MOUTH EVERY DAY  30 tablet  3  . COUMADIN 5 MG tablet TAKE AS DIRECTED BY  ANTICOAGULATION CLINIC  120 tablet  1  . digoxin (LANOXIN) 0.125 MG tablet Take 0.125 mg by mouth every evening.       . donepezil (ARICEPT) 5 MG tablet Take 1 tablet (5 mg total) by mouth daily.  90 tablet  1  . famotidine (PEPCID) 20 MG tablet Take 20 mg by mouth daily as needed for heartburn.       . furosemide (LASIX) 80 MG tablet Take 80 mg by mouth 2 (two) times daily.      [name] KLOR-CON M20 20 MEQ tablet TAKE 1 TABLET BY MOUTH TWICE A DAY  180 tablet  1  . levothyroxine (SYNTHROID, LEVOTHROID) 25 MCG tablet Take 25 mcg by mouth daily.      . metoprolol succinate (TOPROL-XL) 25 MG 24 hr tablet Take 25 mg by mouth once.      . nitroGLYCERIN (NITROSTAT) 0.4 MG SL tablet Place 1  tablet (0.4 mg total) under the tongue every 5 (five) minutes as needed for chest pain.  25 tablet  3  . Pseudoephedrine-APAP-DM (DAYQUIL PO) Take 1 tablet by mouth once.      . valsartan (DIOVAN) 40 MG tablet Take 1 tablet (40 mg total) by mouth daily.  90 tablet  3   No current facility-administered medications for this visit.    Allergies  Allergen Reactions  . Ceclor [Cefaclor] Other (See Comments)    Reaction unknown  . Elastic Bandages & [Zinc] Other (See Comments)    Turns red on the areas it touches  . Lipitor [Atorvastatin Calcium] Other (See Comments)    Increased fibromyalgia pain  . Mevacor [Lovastatin] Other (See Comments)    Increased fibromyalgia pain  . Pravachol Other (See Comments)    Increased fibromyalgia pain  . Vasotec Other (See Comments)    Reaction unknown  . Adhesive [Tape] Itching and Rash    Burning   . Latex Itching and Rash    Burning     Patient Active Problem List   Diagnosis Date Noted  . Diastolic heart failure secondary to hypertension 06/27/2010    Priority: High  . Benign hypertensive heart disease without heart failure 05/29/2010    Priority: High  . Atrial fibrillation 05/06/2010    Priority: High  . Fibromyalgia 05/10/2010    Priority: Medium  . Anemia 05/10/2010    Priority: Medium  . Delirium due to multiple etiologies 02/14/2013  . 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 Long-term (current) use of anticoagulants   . Streptococcal pneumonia 02/12/2013  . Vertebral artery stenosis 12/18/2011  . COPD (chronic obstructive pulmonary disease) 06/04/2010  . Insomnia due to mental condition 05/10/2010  . Depression 05/10/2010  . Cervical spinal stenosis 05/10/2010  . Hypothyroidism 05/10/2010  . Osteoarthritis 05/10/2010  . Osteoporosis 05/10/2010  . S/P CABG (coronary artery bypass graft) 05/10/2010  . Chronic diastolic heart failure   . Dyslipidemia   . CAD (coronary artery disease)   . Unspecified cerebral artery occlusion  with cerebral infarction 05/06/2010    History  Smoking status  . Former Smoker -- 1.50 packs/day for 25 years  . Types: Cigarettes  . Quit date: 02/10/1977  Smokeless tobacco  . Never Used    History  Alcohol Use No    Family History  Problem Relation Age of Onset  . Stroke Mother   . CAD Father   . Diabetes type II Brother   . CAD Brother     Review of Systems: Constitutional: no fever chills diaphoresis or fatigue or  change in weight.  Head and neck: no hearing loss, no epistaxis, no photophobia or visual disturbance. Respiratory: No cough, shortness of breath or wheezing. Cardiovascular: No chest pain peripheral edema, palpitations. Gastrointestinal: No abdominal distention, no abdominal pain, no change in bowel habits hematochezia or melena. Genitourinary: No dysuria, no frequency, no urgency, no nocturia. Musculoskeletal:No arthralgias, no back pain, no gait disturbance or myalgias. Neurological: No dizziness, no headaches, no numbness, no seizures, no syncope, no weakness, no tremors. Hematologic: No lymphadenopathy, no easy bruising. Psychiatric: No confusion, no hallucinations, no sleep disturbance.    Physical Exam: Filed Vitals:   04/01/13 1124  BP: 147/66  Pulse: 62   the general appearance reveals a well-developed well-nourished woman in no distress.  She wears a 4 pound metal brace on her left lower extremity.The head and neck exam reveals pupils equal and reactive.  Extraocular movements are full.  There is no scleral icterus.  The mouth and pharynx are normal.  The neck is supple.  The carotids reveal no bruits.  The jugular venous pressure is normal.  The  thyroid is not enlarged.  There is no lymphadenopathy.  The chest is clear to percussion and auscultation.  There are no rales or rhonchi.  Expansion of the chest is symmetrical.  The precordium is quiet.  The first heart sound is normal.  The second heart sound is physiologically split.  There is no  murmur gallop rub or click.  There is no abnormal lift or heave.  The abdomen is soft and nontender.  The bowel sounds are normal.  The liver and spleen are not enlarged.  There are no abdominal masses.  There are no abdominal bruits.  Extremities reveal good pedal pulses.  There is no phlebitis or edema.  There is no cyanosis or clubbing.  Strength is normal and symmetrical in all extremities.  There is no lateralizing weakness.  There are no sensory deficits.  The skin is warm and dry.  There is no rash.     Assessment / Plan: The patient appears to be doing very well.  Her weight is down 7 pounds since I last saw her several months ago.  Her appetite is good.  She is not fluid overloaded at this time.  She will continue same medication and be rechecked in 3 months for office visit EKG and basal metabolic panel

## 2013-04-01 NOTE — Assessment & Plan Note (Signed)
The patient has not been experiencing any chest pain or angina.  She is not having any increased dyspnea recently.  Her fluid balance appears to be good on current dose of Lasix 2 tablets twice a day.

## 2013-04-01 NOTE — Patient Instructions (Signed)
Will obtain labs today and call you with the results (lp/bmet/hfp/tsh/ft4/cbc)  Your physician recommends that you continue on your current medications as directed. Please refer to the Current Medication list given to you today.  Your physician recommends that you schedule a follow-up appointment in: 3 month /ekg/bmet

## 2013-04-01 NOTE — Assessment & Plan Note (Signed)
The patient is not having any orthopnea.  At the present time she is not experiencing any significant peripheral edema.  We'll continue Lasix 80 mg twice a day

## 2013-04-01 NOTE — Assessment & Plan Note (Signed)
The patient is an established permanent atrial fibrillation.  She has not had any TIA symptoms.  She is not having a problem from the warfarin except for some minor skin bruising.

## 2013-04-03 NOTE — Progress Notes (Signed)
Quick Note:  Please report to patient. The recent labs are stable. Continue same medication and careful diet. Thyroid level okay. CBC okay. No anemia. Cholesterol still very high. I would like her to start Zetia ( thought she was allergic to it but I don't see it on her Epic list of allergies??). If she cannot take Zetia I would like to refer her to the lipid clinic with [facility]. ______

## 2013-04-05 ENCOUNTER — Telehealth: Payer: Self-pay | Admitting: *Deleted

## 2013-04-05 DIAGNOSIS — E7800 Pure hypercholesterolemia, unspecified: Secondary | ICD-10-CM

## 2013-04-05 MED ORDER — EZETIMIBE 10 MG PO TABS: 10.0000 mg | ORAL_TABLET | Freq: Every day | ORAL | Status: DC

## 2013-04-05 NOTE — Telephone Encounter (Signed)
Check LP HFP BMET in 2 month

## 2013-04-05 NOTE — Telephone Encounter (Signed)
Message copied by Earvin Hansen on Tue Apr 05, 2013  1:57 PM ------      Message from: Darlin Coco      Created: Sun Apr 03, 2013  8:50 PM       Please report to patient.  The recent labs are stable. Continue same medication and careful diet. Thyroid level okay.  CBC okay.  No anemia. Cholesterol still very high. I would like her to start Zetia ( thought she was allergic to it but I don't see it on her Epic list of allergies??).  If she cannot take Zetia I would like to refer her to the lipid clinic with [facility]. ------

## 2013-04-05 NOTE — Telephone Encounter (Signed)
Advised patient and she doesn't think she has tried Zetia. She will try and let us know if she has any problems.

## 2013-04-06 NOTE — Telephone Encounter (Signed)
Advised and scheduled

## 2013-04-07 ENCOUNTER — Other Ambulatory Visit: Payer: Self-pay

## 2013-04-07 MED ORDER — AMITRIPTYLINE HCL 50 MG PO TABS: 50.0000 mg | ORAL_TABLET | Freq: Every day | ORAL | Status: DC

## 2013-04-07 NOTE — Telephone Encounter (Signed)
Former [facility] patient was seen by Dr Rexene Alberts, but she asked that the be reassigned.  Auth 1 refill via [facility]

## 2013-04-22 ENCOUNTER — Ambulatory Visit (INDEPENDENT_AMBULATORY_CARE_PROVIDER_SITE_OTHER): Payer: Medicare Other | Admitting: Pharmacist

## 2013-04-22 DIAGNOSIS — I6350 Cerebral infarction due to unspecified occlusion or stenosis of unspecified cerebral artery: Secondary | ICD-10-CM

## 2013-04-22 DIAGNOSIS — I4891 Unspecified atrial fibrillation: Secondary | ICD-10-CM

## 2013-04-22 LAB — POCT INR
INR: 3
INR: 3

## 2013-05-06 ENCOUNTER — Other Ambulatory Visit: Payer: Self-pay

## 2013-05-06 MED ORDER — CLOPIDOGREL BISULFATE 75 MG PO TABS: ORAL_TABLET | ORAL | Status: DC

## 2013-05-06 MED ORDER — AMLODIPINE BESYLATE 5 MG PO TABS: 5.0000 mg | ORAL_TABLET | Freq: Every evening | ORAL | Status: DC

## 2013-05-10 ENCOUNTER — Other Ambulatory Visit: Payer: Self-pay | Admitting: Neurology

## 2013-05-10 NOTE — Telephone Encounter (Signed)
[facility] patient was seen by Dr Rexene Alberts, but she asked that the be reassigned. WID

## 2013-05-13 ENCOUNTER — Ambulatory Visit (INDEPENDENT_AMBULATORY_CARE_PROVIDER_SITE_OTHER): Payer: Medicare Other | Admitting: *Deleted

## 2013-05-13 DIAGNOSIS — I6350 Cerebral infarction due to unspecified occlusion or stenosis of unspecified cerebral artery: Secondary | ICD-10-CM

## 2013-05-13 DIAGNOSIS — I4891 Unspecified atrial fibrillation: Secondary | ICD-10-CM

## 2013-05-13 LAB — POCT INR: INR: 1.8

## 2013-05-18 ENCOUNTER — Other Ambulatory Visit: Payer: Self-pay

## 2013-05-18 DIAGNOSIS — Z1231 Encounter for screening mammogram for malignant neoplasm of breast: Secondary | ICD-10-CM

## 2013-05-27 ENCOUNTER — Ambulatory Visit (INDEPENDENT_AMBULATORY_CARE_PROVIDER_SITE_OTHER): Payer: Medicare Other | Admitting: Pharmacist

## 2013-05-27 DIAGNOSIS — I4891 Unspecified atrial fibrillation: Secondary | ICD-10-CM

## 2013-05-27 DIAGNOSIS — I6350 Cerebral infarction due to unspecified occlusion or stenosis of unspecified cerebral artery: Secondary | ICD-10-CM

## 2013-05-27 LAB — POCT INR: INR: 2.3

## 2013-05-30 ENCOUNTER — Telehealth: Payer: Self-pay | Admitting: Cardiology

## 2013-05-30 ENCOUNTER — Other Ambulatory Visit: Payer: Self-pay | Admitting: Neurology

## 2013-05-30 NOTE — Telephone Encounter (Signed)
Pt advised,verbalized understanding. 

## 2013-05-30 NOTE — Telephone Encounter (Signed)
New Prob   Pt states she has swelling in legs bilaterally. Also has noted some wheezing. Would like to speak to a nurse regarding her concerns. Please call.

## 2013-05-30 NOTE — Telephone Encounter (Signed)
No need to get Brand name Lasix.  The generic furosemide should work okay.  She should increase her furosemide today and take 120 mg twice a day rather than 80 mg twice a day for today only and let us know tomorrow how she did with that.

## 2013-05-30 NOTE — Telephone Encounter (Signed)
Reassigned to Dr. Yan. 

## 2013-05-30 NOTE — Telephone Encounter (Signed)
Pt states starting last week she had increase swelling in her left leg. Both legs usually have some swelling. Her SOB has been stable, her weight has been stable, she did notice some  wheezing last night. She is asking if this may be associated with retaining fluid. She does have COPD but has not had any wheezing recently. She is going to try her albuterol inhaler prn if she has more wheezing. She takes furosemide 80mg  bid and is asking if Dr Mare Ferrari thinks this  should be adjusted or if she should request brand name lasix, it sometimes seems to work better. She is not in any distress over the telephone. I will forward to Dr Mare Ferrari for review.

## 2013-06-06 ENCOUNTER — Ambulatory Visit: Payer: Medicare Other | Admitting: Nurse Practitioner

## 2013-06-08 ENCOUNTER — Other Ambulatory Visit: Payer: Medicare Other

## 2013-06-08 ENCOUNTER — Ambulatory Visit
Admission: RE | Admit: 2013-06-08 | Discharge: 2013-06-08 | Disposition: A | Discharge status: Home / Self Care | Payer: Medicare Other | Source: Ambulatory Visit

## 2013-06-08 DIAGNOSIS — Z1231 Encounter for screening mammogram for malignant neoplasm of breast: Secondary | ICD-10-CM

## 2013-06-10 ENCOUNTER — Ambulatory Visit (INDEPENDENT_AMBULATORY_CARE_PROVIDER_SITE_OTHER): Payer: Medicare Other | Admitting: Neurology

## 2013-06-10 ENCOUNTER — Encounter: Payer: Self-pay | Admitting: Neurology

## 2013-06-10 VITALS — BP 133/65 | HR 57 | Ht 65.5 in | Wt 162.0 lb

## 2013-06-10 DIAGNOSIS — I5032 Chronic diastolic (congestive) heart failure: Secondary | ICD-10-CM

## 2013-06-10 DIAGNOSIS — I119 Hypertensive heart disease without heart failure: Secondary | ICD-10-CM

## 2013-06-10 DIAGNOSIS — M4802 Spinal stenosis, cervical region: Secondary | ICD-10-CM

## 2013-06-10 DIAGNOSIS — I6350 Cerebral infarction due to unspecified occlusion or stenosis of unspecified cerebral artery: Secondary | ICD-10-CM

## 2013-06-10 DIAGNOSIS — I4891 Unspecified atrial fibrillation: Secondary | ICD-10-CM

## 2013-06-10 DIAGNOSIS — J449 Chronic obstructive pulmonary disease, unspecified: Secondary | ICD-10-CM

## 2013-06-10 DIAGNOSIS — F32A Depression, unspecified: Secondary | ICD-10-CM

## 2013-06-10 DIAGNOSIS — I110 Hypertensive heart disease with heart failure: Secondary | ICD-10-CM

## 2013-06-10 DIAGNOSIS — E785 Hyperlipidemia, unspecified: Secondary | ICD-10-CM

## 2013-06-10 DIAGNOSIS — F329 Major depressive disorder, single episode, unspecified: Secondary | ICD-10-CM

## 2013-06-10 DIAGNOSIS — F3289 Other specified depressive episodes: Secondary | ICD-10-CM

## 2013-06-10 DIAGNOSIS — I5030 Unspecified diastolic (congestive) heart failure: Secondary | ICD-10-CM

## 2013-06-10 DIAGNOSIS — I2510 Atherosclerotic heart disease of native coronary artery without angina pectoris: Secondary | ICD-10-CM

## 2013-06-10 DIAGNOSIS — I509 Heart failure, unspecified: Secondary | ICD-10-CM

## 2013-06-10 DIAGNOSIS — F05 Delirium due to known physiological condition: Secondary | ICD-10-CM

## 2013-06-10 MED ORDER — MEMANTINE HCL ER 28 MG PO CP24: 28.0000 mg | ORAL_CAPSULE | Freq: Every day | ORAL | Status: DC

## 2013-06-10 MED ORDER — MEMANTINE HCL ER 7 & 14 & 21 &28 MG PO CP24: ORAL_CAPSULE | ORAL | Status: DC

## 2013-06-10 MED ORDER — NORTRIPTYLINE HCL 50 MG PO CAPS: 50.0000 mg | ORAL_CAPSULE | Freq: Every day | ORAL | Status: DC

## 2013-06-10 NOTE — Progress Notes (Signed)
Subjective:    Patient ID: Amber [name] is a 79 y.o. female previously was patient of Dr. Erling Cruz, last time seen by Dr. Rexene Alberts in July 2014.   She has an underlying complicated medical history of heart disease including CHF, status post bypass surgery, pontine stroke with left hemiparesis in 2000, right-sided TIA in 2009, fibromyalgia, arthritis, hypertension, hyperlipidemia, atrial fibrillation, depression, lower back pain, with degenerative joint disease and occlusion of right vertebral artery, COPD.  She fell and ruptured her Achille's tendon in December 2013. She has not driven in a while. Her husband dropped her off today.  She was asked  not to drive. She was at fall risk.  She wears left ankle rigid brace, unsteady gait, she is taking coumadin because of Afib, plavix for CAD.  Higher dose of Aricept causes hallucinations, she is only take 5mg  of donepezil each day, Her falls assessment tool score was 19 at the time   Her pulmonologist, Dr. Halford Chessman, increased her Lasix to 180 mg daily for now and she is going to see her cardiologist, Dr. Mare Ferrari, regularly for her atrial fibrillation, coronary artery disease,    UPDATE May 2015: She was admitted to hospital in Jan 2015 for community-acquired pneumonia, also has a fibrillation, diastolic heart failure, COPD, presented with dyspnea, hypoxia,.  She now has recovered, continues to have significant gait difficulty, due to left-sided weakness, also complains of left ankle plantarflexion.    Past Medical History Is Significant For: Past Medical History  Diagnosis Date  . Coronary artery disease     a. s/p CABG;  b. 04/2010 Neg MV  . Hypertension   . Hyperlipidemia   . Hypothyroidism   . Depression   . Insomnia   . Fibromyalgia   . Gout   . Osteoarthritis   . Osteoporosis   . Cervical spinal stenosis   . Nasal polyposis   . Pneumonia     1990  . COPD with emphysema     PFT 05/02/10>>FEV1 1.35(62%), FEV1% 66, DLCO 75%  . Chronic  diastolic CHF (congestive heart failure)     a. 02/2013 Echo: EF 55-60%, mild AI, mod dil LA.  [name] Atrial fibrillation     a. chronic/rate controlled;  b. chronic coumadin.  . CVA (cerebral vascular accident)     left sided weakness  . GERD (gastroesophageal reflux disease)     pepcid 2-3 times per week  . Intermittent confusion   . History of melanoma     squamous cell, melanoma    Her Past Surgical History Is Significant For: Past Surgical History  Procedure Laterality Date  . Abdominal hysterectomy    . Breast lumpectomy  1980s    Benign lesion - right  . Carpel tunnel      right  . Tonsillectomy    . Eye surgery      bilateral cataracts  . Coronary artery bypass graft  2000  . Radiology with anesthesia  12/24/2011    Procedure: RADIOLOGY WITH ANESTHESIA;  Surgeon: Medication Radiologist, MD;  Location: [facility];  Service: Radiology;  Laterality: N/A;  Extra Cranial Vascular Stent    Her Family History Is Significant For: Family History  Problem Relation Age of Onset  . Stroke Mother   . CAD Father   . Diabetes type II Brother   . CAD Brother     Social History Is Significant For: History   Social History  . Marital Status: Married    Spouse Name: N/A  Number of Children: N/A  . Years of Education: N/A   Occupational History  . [employer]   . Secretary     Retired   Social History Main Topics  . Smoking status: Former Smoker -- 1.50 packs/day for 25 years    Types: Cigarettes    Quit date: 02/10/1977  . Smokeless tobacco: Never Used  . Alcohol Use: No  . Drug Use: No  . Sexual Activity: None   Other Topics Concern  . None   Social History Narrative  . None    Allergies Are:  Allergies  Allergen Reactions  . Ceclor [Cefaclor] Other (See Comments)    Reaction unknown  . Elastic Bandages & [Zinc] Other (See Comments)    Turns red on the areas it touches  . Lipitor [Atorvastatin Calcium] Other (See Comments)    Increased  fibromyalgia pain  . Mevacor [Lovastatin] Other (See Comments)    Increased fibromyalgia pain  . Pravachol Other (See Comments)    Increased fibromyalgia pain  . Vasotec Other (See Comments)    Reaction unknown  . Adhesive [Tape] Itching and Rash    Burning   . Latex Itching and Rash    Burning   :   Her Current Medications Are:  Outpatient Encounter Prescriptions as of 06/10/2013  Medication Sig  . albuterol (PROVENTIL HFA;VENTOLIN HFA) 108 (90 BASE) MCG/ACT inhaler Inhale 2 puffs into the lungs every 6 (six) hours as needed for wheezing or shortness of breath.  [name] alendronate (FOSAMAX) 70 MG tablet Take 70 mg by mouth every 7 (seven) days. Take with a full glass of water on an empty stomach. Sundays  . allopurinol (ZYLOPRIM) 300 MG tablet Take 300 mg by mouth every evening.  [name] allopurinol (ZYLOPRIM) 300 MG tablet TAKE 1 TABLET EVERY DAY  . amitriptyline (ELAVIL) 50 MG tablet TAKE 1 TABLET BY MOUTH AT BEDTIME  . amLODipine (NORVASC) 5 MG tablet Take 1 tablet (5 mg total) by mouth every evening.  . celecoxib (CELEBREX) 200 MG capsule Take 200 mg by mouth 2 (two) times daily.    . citalopram (CELEXA) 20 MG tablet Take 1 tablet (20 mg total) by mouth 2 (two) times daily.  . clopidogrel (PLAVIX) 75 MG tablet TAKE 1 TABLET BY MOUTH EVERY DAY  . COUMADIN 5 MG tablet TAKE AS DIRECTED BY ANTICOAGULATION CLINIC  . digoxin (LANOXIN) 0.125 MG tablet Take 0.125 mg by mouth every evening.   . donepezil (ARICEPT) 5 MG tablet Take 1 tablet (5 mg total) by mouth daily.  . famotidine (PEPCID) 20 MG tablet Take 20 mg by mouth daily as needed for heartburn.   . furosemide (LASIX) 80 MG tablet Take 80 mg by mouth 2 (two) times daily.  [name] KLOR-CON M20 20 MEQ tablet TAKE 1 TABLET BY MOUTH TWICE A DAY  . levothyroxine (SYNTHROID, LEVOTHROID) 25 MCG tablet Take 25 mcg by mouth daily.  . metoprolol succinate (TOPROL-XL) 25 MG 24 hr tablet Take 25 mg by mouth once.  . nitroGLYCERIN (NITROSTAT) 0.4 MG SL tablet  Place 1 tablet (0.4 mg total) under the tongue every 5 (five) minutes as needed for chest pain.  . Pseudoephedrine-APAP-DM (DAYQUIL PO) Take 1 tablet by mouth once.  . valsartan (DIOVAN) 40 MG tablet Take 1 tablet (40 mg total) by mouth daily.  . [DISCONTINUED] ezetimibe (ZETIA) 10 MG tablet Take 1 tablet (10 mg total) by mouth daily.    Objective:    Physical Exam Physical Examination:   Filed Vitals:  06/10/13 1015  BP: 133/65  Pulse: 57   PHYSICAL EXAMINATOINS:  Generalized: In no acute distress  Neck: Supple, no carotid bruits   Cardiac: Regular rate rhythm  Pulmonary: Clear to auscultation bilaterally  Musculoskeletal: No deformity  Neurological examination  Mentation: Alert oriented to time, place, history taking, and causual conversation, Mini-Mental Status Examination is 28 out of 30, she missed 2 out of 3 recalls. Animal naming 15 per minutes  Cranial nerve II-XII: Pupils were equal round reactive to light extraocular movements were full, visual field were full on confrontational test.  Bilateral fundi were sharp  Facial sensation and strength were normal. hearing was intact to finger rubbing bilaterally. Uvula tongue midline.  head turning and shoulder shrug and were normal and symmetric.Tongue protrusion into cheek strength was normal.  Motor: She has mild spasticity of left upper extremity, slight left shoulder abduction, external rotation weakness, she also has a moderate spasticity of left leg, mild to moderate left hip flexion, knee flexion weakness, left ankle tends to stay in left plantar flexion position, limited range of motion, maximum 90  Sensory: Intact to fine touch, pinprick, preserved vibratory sensation, and proprioception at toes.  Coordination: Normal finger to nose, heel-to-shin bilaterally there was no truncal ataxia  Gait: Need to push up from seated position, dragging her left leg, left spastic hemi-circumeferential gait,  Romberg signs:  Negative  Deep tendon reflexes: Brachioradialis 2/2, biceps 2/2, triceps 2/2, patellar 2/2, Achilles 2/2, plantar responses were flexor at right,  extensor at the left  Assessment and plan: 79 year old Caucasian female, complicated past medical history, including previous stroke, with residual spastic left hemiparesis, gait difficulty, left foot ankle plantar flexion, atrial fibrillation, on chronic Coumadin treatment, coronary artery disease, on Plavix, hypertension, hyperlipidemia,   1, she would benefit from physical therapy,  2. She complains of worsening memory trouble, we will add on titrating dose of Namenda 3. She will be a candidate for Botox injections for spastic left lower extremity, but she is on Coumadin, and Plavix, she has significant gait difficulty, had a falling episode in the past, is at high risk of fall, I will forward the note to her cardiologist Dr. Mare Ferrari for evaluation of her long term anticoagulation, antiplatelet treatment plan. 4. RTC in 6 months

## 2013-06-12 ENCOUNTER — Other Ambulatory Visit: Payer: Self-pay

## 2013-06-12 MED ORDER — NORTRIPTYLINE HCL 50 MG PO CAPS: 50.0000 mg | ORAL_CAPSULE | Freq: Every day | ORAL | Status: DC

## 2013-06-12 MED ORDER — MEMANTINE HCL ER 28 MG PO CP24: 28.0000 mg | ORAL_CAPSULE | Freq: Every day | ORAL | Status: DC

## 2013-06-12 NOTE — Telephone Encounter (Signed)
Pharmacy requests 90 day supply

## 2013-06-15 ENCOUNTER — Other Ambulatory Visit: Payer: Self-pay | Admitting: Radiology

## 2013-06-15 DIAGNOSIS — I6529 Occlusion and stenosis of unspecified carotid artery: Secondary | ICD-10-CM

## 2013-06-17 ENCOUNTER — Other Ambulatory Visit: Payer: Self-pay | Admitting: Cardiology

## 2013-06-17 ENCOUNTER — Other Ambulatory Visit: Payer: Self-pay

## 2013-06-17 ENCOUNTER — Other Ambulatory Visit: Payer: Self-pay | Admitting: Neurology

## 2013-06-17 MED ORDER — FUROSEMIDE 80 MG PO TABS: 80.0000 mg | ORAL_TABLET | Freq: Two times a day (BID) | ORAL | Status: DC

## 2013-06-21 ENCOUNTER — Encounter: Payer: Self-pay | Admitting: Cardiology

## 2013-06-21 ENCOUNTER — Other Ambulatory Visit: Payer: Medicare Other

## 2013-06-21 ENCOUNTER — Ambulatory Visit (INDEPENDENT_AMBULATORY_CARE_PROVIDER_SITE_OTHER): Payer: Medicare Other | Admitting: Cardiology

## 2013-06-21 ENCOUNTER — Encounter (INDEPENDENT_AMBULATORY_CARE_PROVIDER_SITE_OTHER): Payer: Medicare Other

## 2013-06-21 VITALS — BP 144/74 | HR 68 | Ht 65.5 in | Wt 160.0 lb

## 2013-06-21 DIAGNOSIS — I5032 Chronic diastolic (congestive) heart failure: Secondary | ICD-10-CM

## 2013-06-21 DIAGNOSIS — I4891 Unspecified atrial fibrillation: Secondary | ICD-10-CM

## 2013-06-21 DIAGNOSIS — I110 Hypertensive heart disease with heart failure: Secondary | ICD-10-CM

## 2013-06-21 DIAGNOSIS — I509 Heart failure, unspecified: Secondary | ICD-10-CM

## 2013-06-21 DIAGNOSIS — I5030 Unspecified diastolic (congestive) heart failure: Secondary | ICD-10-CM

## 2013-06-21 DIAGNOSIS — E7800 Pure hypercholesterolemia, unspecified: Secondary | ICD-10-CM

## 2013-06-21 DIAGNOSIS — I6350 Cerebral infarction due to unspecified occlusion or stenosis of unspecified cerebral artery: Secondary | ICD-10-CM

## 2013-06-21 DIAGNOSIS — I2510 Atherosclerotic heart disease of native coronary artery without angina pectoris: Secondary | ICD-10-CM

## 2013-06-21 NOTE — Assessment & Plan Note (Signed)
The patient has not had any recurrent chest pain or angina. 

## 2013-06-21 NOTE — Assessment & Plan Note (Signed)
The patient has not been having any orthopnea or paroxysmal nocturnal dyspnea.  She does have mild pretibial edema.  Some of this may be related to her amlodipine now that the weather is warmer.  She will continue same dose of furosemide 80 mg twice a day

## 2013-06-21 NOTE — Patient Instructions (Signed)
Return soon for appointment with [facility] in the lipid clinic.  Your physician recommends that you schedule a follow-up appointment in: 3 months with fasting labs (LP/BMET/HFP)   Your physician recommends that you continue on your current medications as directed. Please refer to the Current Medication list given to you today.

## 2013-06-21 NOTE — Progress Notes (Signed)
Amber [name] Date of Birth:  August 09, 1934 [facility] [address] [place], [place]  [address] [phone]        Fax   [phone]   History of Present Illness:  This pleasant 79 year old woman is seen for a scheduled followup office visit.  She was hospitalized 02/12/13 until 02/19/13 for acute pneumococcal pneumonia. She has made a good recovery from the pneumonia and feels well at the present time. She has a complex past medical history. She has known ischemic heart disease with coronary artery bypass graft surgery in 2000. She has had remote stroke with residual left hemiparesis. She's had a long history of high blood pressure and high cholesterol. She has established atrial fibrillation and is on Coumadin. She has chronically elevated CK levels and she has a history of fibromyalgia. She is followed for this by Dr. Lenna Gilford, rheumatologist with [facility]. She does not tolerate statins. She's had congestive heart failure secondary to diastolic dysfunction. She was cardioverted in June 2012 but remained in sinus rhythm for less than one week. She is now in established chronic atrial fibrillation on Coumadin.  The patient has a history of COPD. She recently saw Dr. Halford Chessman who felt that most of her dyspnea was probably related to worsening congestive heart failure rather than her lung disease. A chest x-ray on 08/06/12 showed x-ray findings of mild CHF. The patient has improved since increasing her furosemide to a total daily dose of 160 mg. previously we had switched her from Diovan to losartan because of cost . However, she did not do as well and we switched her back to Diovan and she feels better . The patient still has very little stamina when she walks.  The patient had an echocardiogram on 08/12/12 showing an ejection fraction of 60-65%. She also had a Lexa scan Myoview on 08/25/12 which showed no evidence of ischemia and she does have an old inferior wall  myocardial infarction scar. She had a repeat Lexi scan Myoview on 03/23/13 which showed findings similar to July 2014. There is inferior wall scar with surrounding peri-infarct ischemia. Since last visit the patient has had no new cardiac symptoms.  She has persistently high cholesterol levels and is intolerant of all statin drugs as well as ezetimibe.  Current Outpatient Prescriptions  Medication Sig Dispense Refill  . albuterol (PROVENTIL HFA;VENTOLIN HFA) 108 (90 BASE) MCG/ACT inhaler Inhale 2 puffs into the lungs every 6 (six) hours as needed for wheezing or shortness of breath.  3 Inhaler  11  . alendronate (FOSAMAX) 70 MG tablet Take 70 mg by mouth every 7 (seven) days. Take with a full glass of water on an empty stomach. Sundays      . allopurinol (ZYLOPRIM) 300 MG tablet Take 300 mg by mouth every evening.      [name] allopurinol (ZYLOPRIM) 300 MG tablet TAKE 1 TABLET EVERY DAY  90 tablet  0  . amLODipine (NORVASC) 5 MG tablet Take 1 tablet (5 mg total) by mouth every evening.  90 tablet  6  . celecoxib (CELEBREX) 200 MG capsule Take 200 mg by mouth 2 (two) times daily.        . citalopram (CELEXA) 20 MG tablet Take 1 tablet (20 mg total) by mouth 2 (two) times daily.  60 tablet  5  . clopidogrel (PLAVIX) 75 MG tablet TAKE 1 TABLET BY MOUTH EVERY DAY  90 tablet  3  . COUMADIN 5 MG tablet TAKE AS  DIRECTED BY ANTICOAGULATION CLINIC  120 tablet  1  . digoxin (LANOXIN) 0.125 MG tablet Take 0.125 mg by mouth every evening.       . donepezil (ARICEPT) 5 MG tablet Take 1 tablet (5 mg total) by mouth daily.  90 tablet  1  . famotidine (PEPCID) 20 MG tablet Take 20 mg by mouth daily as needed for heartburn.       . furosemide (LASIX) 80 MG tablet Take 1 tablet (80 mg total) by mouth 2 (two) times daily.  60 tablet  3  . KLOR-CON M20 20 MEQ tablet TAKE 1 TABLET BY MOUTH TWICE A DAY  180 tablet  1  . levothyroxine (SYNTHROID, LEVOTHROID) 25 MCG tablet Take 25 mcg by mouth daily.      . metoprolol  succinate (TOPROL-XL) 25 MG 24 hr tablet Take 25 mg by mouth once.      . nitroGLYCERIN (NITROSTAT) 0.4 MG SL tablet Place 1 tablet (0.4 mg total) under the tongue every 5 (five) minutes as needed for chest pain.  25 tablet  3  . nortriptyline (PAMELOR) 50 MG capsule Take 1 capsule (50 mg total) by mouth at bedtime.  90 capsule  4  . Pseudoephedrine-APAP-DM (DAYQUIL PO) Take 1 tablet by mouth once.      . valsartan (DIOVAN) 40 MG tablet Take 1 tablet (40 mg total) by mouth daily.  90 tablet  3   No current facility-administered medications for this visit.    Allergies  Allergen Reactions  . Ceclor [Cefaclor] Other (See Comments)    Reaction unknown  . Elastic Bandages & [Zinc] Other (See Comments)    Turns red on the areas it touches  . Ezetimibe     Myalgia  . Lipitor [Atorvastatin Calcium] Other (See Comments)    Increased fibromyalgia pain  . Mevacor [Lovastatin] Other (See Comments)    Increased fibromyalgia pain  . Pravachol Other (See Comments)    Increased fibromyalgia pain  . Vasotec Other (See Comments)    Reaction unknown  . Adhesive [Tape] Itching and Rash    Burning   . Latex Itching and Rash    Burning     Patient Active Problem List   Diagnosis Date Noted  . Diastolic heart failure secondary to hypertension 06/27/2010    Priority: High  . Benign hypertensive heart disease without heart failure 05/29/2010    Priority: High  . Atrial fibrillation 05/06/2010    Priority: High  . Fibromyalgia 05/10/2010    Priority: Medium  . Anemia 05/10/2010    Priority: Medium  . Delirium due to multiple etiologies 02/14/2013  . 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 Long-term (current) use of anticoagulants   . Streptococcal pneumonia 02/12/2013  . Vertebral artery stenosis 12/18/2011  . COPD (chronic obstructive pulmonary disease) 06/04/2010  . Insomnia due to mental condition 05/10/2010  . Depression 05/10/2010  . Cervical spinal stenosis 05/10/2010  .  Hypothyroidism 05/10/2010  . Osteoarthritis 05/10/2010  . Osteoporosis 05/10/2010  . S/P CABG (coronary artery bypass graft) 05/10/2010  . Chronic diastolic heart failure   . Dyslipidemia   . CAD (coronary artery disease)   . Unspecified cerebral artery occlusion with cerebral infarction 05/06/2010    History  Smoking status  . Former Smoker -- 1.50 packs/day for 25 years  . Types: Cigarettes  . Quit date: 02/10/1977  Smokeless tobacco  . Never Used    History  Alcohol Use No    Family History  Problem Relation Age of Onset  .  Stroke Mother   . CAD Father   . Diabetes type II Brother   . CAD Brother     Review of Systems: Constitutional: no fever chills diaphoresis or fatigue or change in weight.  Head and neck: no hearing loss, no epistaxis, no photophobia or visual disturbance. Respiratory: No cough, shortness of breath or wheezing. Cardiovascular: No chest pain peripheral edema, palpitations. Gastrointestinal: No abdominal distention, no abdominal pain, no change in bowel habits hematochezia or melena. Genitourinary: No dysuria, no frequency, no urgency, no nocturia. Musculoskeletal:No arthralgias, no back pain, no gait disturbance or myalgias. Neurological: No dizziness, no headaches, no numbness, no seizures, no syncope, no weakness, no tremors. Hematologic: No lymphadenopathy, no easy bruising. Psychiatric: No confusion, no hallucinations, no sleep disturbance.    Physical Exam: Filed Vitals:   06/21/13 1203  BP: 144/74  Pulse: 68   the general appearance reveals a well-developed well-nourished alert woman in no acute distress.  She wears a brace on her left lower leg from the previous stroke.The head and neck exam reveals pupils equal and reactive.  Extraocular movements are full.  There is no scleral icterus.  The mouth and pharynx are normal.  The neck is supple.  The carotids reveal no bruits.  The jugular venous pressure is normal.  The  thyroid is not  enlarged.  There is no lymphadenopathy.  The chest is clear to percussion and auscultation.  There are no rales or rhonchi.  Expansion of the chest is symmetrical.  The pulse is irregularly irregular. The precordium is quiet.  The first heart sound is normal.  The second heart sound is physiologically split.  There is no murmur gallop rub or click.  There is no abnormal lift or heave.  The abdomen is soft and nontender.  The bowel sounds are normal.  The liver and spleen are not enlarged.  There are no abdominal masses.  There are no abdominal bruits.  Extremities reveal good pedal pulses.  There is no phlebitis or edema.  There is no cyanosis or clubbing.  Strength is normal and symmetrical in all extremities.  There is no lateralizing weakness.  There are no sensory deficits.  The skin is warm and dry.  There is no rash.  EKG shows atrial fibrillation with a controlled ventricular response.  Since prior tracing of 02/12/13, the heart rate is slower.   Assessment / Plan: 1. ischemic heart disease status post CABG in 2000. 2.  History of remote stroke with residual left hemiparesis. 3. hypercholesterolemia with intolerance to statins and ezetimibe 4. chronic atrial fibrillation on long-term warfarin 5. strict fibromyalgia followed by Dr. Lenna Gilford 6. chronic diastolic heart failure 7. history of COPD followed by Dr. Halford Chessman. 8. history of essential hypertension  Disposition: Continue same medication.  For further consideration of options for treating her hypercholesterolemia we will refer her to [employer].  Possibly she could be a candidate for PCSK9 injections? Recheck here in 3 months for followup office visit lipid panel hepatic function panel and basal metabolic panel

## 2013-06-21 NOTE — Assessment & Plan Note (Addendum)
The patient remains in atrial fibrillation with controlled ventricular response.  She is on long-term Coumadin.  She has not had any new TIA symptoms

## 2013-06-24 ENCOUNTER — Ambulatory Visit (HOSPITAL_COMMUNITY)
Admission: RE | Admit: 2013-06-24 | Discharge: 2013-06-24 | Disposition: A | Discharge status: Home / Self Care | Payer: Medicare Other | Source: Ambulatory Visit | Attending: Radiology | Admitting: Radiology

## 2013-06-24 DIAGNOSIS — I779 Disorder of arteries and arterioles, unspecified: Secondary | ICD-10-CM | POA: Insufficient documentation

## 2013-06-24 DIAGNOSIS — I6529 Occlusion and stenosis of unspecified carotid artery: Secondary | ICD-10-CM

## 2013-06-24 NOTE — Progress Notes (Signed)
VASCULAR LAB PRELIMINARY  PRELIMINARY  PRELIMINARY  PRELIMINARY  Carotid duplex completed.  .    Preliminary report:  1-39%  ICA stenosis   Margarette Canada, RVT 06/24/2013, 6:05 PM

## 2013-06-26 ENCOUNTER — Other Ambulatory Visit: Payer: Self-pay | Admitting: Cardiology

## 2013-06-27 ENCOUNTER — Other Ambulatory Visit: Payer: Self-pay | Admitting: *Deleted

## 2013-06-27 MED ORDER — CITALOPRAM HYDROBROMIDE 20 MG PO TABS: 20.0000 mg | ORAL_TABLET | Freq: Two times a day (BID) | ORAL | Status: DC

## 2013-06-28 ENCOUNTER — Ambulatory Visit (INDEPENDENT_AMBULATORY_CARE_PROVIDER_SITE_OTHER): Payer: Medicare Other | Admitting: Pharmacist

## 2013-06-28 VITALS — Wt 163.0 lb

## 2013-06-28 DIAGNOSIS — I6350 Cerebral infarction due to unspecified occlusion or stenosis of unspecified cerebral artery: Secondary | ICD-10-CM

## 2013-06-28 DIAGNOSIS — I4891 Unspecified atrial fibrillation: Secondary | ICD-10-CM

## 2013-06-28 DIAGNOSIS — E785 Hyperlipidemia, unspecified: Secondary | ICD-10-CM

## 2013-06-28 LAB — POCT INR: INR: 2.7

## 2013-06-28 MED ORDER — ROSUVASTATIN CALCIUM 5 MG PO TABS: 5.0000 mg | ORAL_TABLET | Freq: Every day | ORAL | Status: DC

## 2013-06-28 NOTE — Progress Notes (Signed)
Patient is a pleasant 79 WF referred to lipid clinic by Dr. Mare Ferrari to see if she can get into [facility] given h/o CAD and statin intolerance.  Patient has failed Zocor, Lipitor, Mevacor, and pravastatin she tells me due to muscle aches.  She recently failed Zetia as well.  Her statin use was mostly in the last 1990's, and I don't have the strength of statin used on file.  Dr. Mare Ferrari was her cardiologist at time of using these statins according to patient.  She has never taken Crestor she tells me.  She had CABG in 2000 and CVA in 2001.  Her mother had a CVA at young age (~ 14 y.o.), and father and brother both had h/o CAD.  Patient possibly has heterozygous FH.  She also has a low HDL which may help her get into SPIRE clinical trial.  Patient has to show that she can't take lowest available dose of statin to get into clinical trial.  Since she hasn't tried Crestor, we could try low dose of this daily.  Patient eats a low fat diet already.    RF:  CAD, CVA, FH, family history CAD, low HDL - LDL goal < 70, or at least < 100 mg/dL given baseline LDL > 200 Meds:  Not on lipid lowering medication as she hasn't been able to tolerate Intolerant:  Zocor, Lipitor, Mevacor, and pravastatin (severe muscle aches).  Zetia (muscle aches).  Welchol.  Labs:   03/2013:  TC 378, TG 386, HDL 39, LDL 245 - not on lipid lowering meds  Current Outpatient Prescriptions  Medication Sig Dispense Refill  . albuterol (PROVENTIL HFA;VENTOLIN HFA) 108 (90 BASE) MCG/ACT inhaler Inhale 2 puffs into the lungs every 6 (six) hours as needed for wheezing or shortness of breath.  3 Inhaler  11  . alendronate (FOSAMAX) 70 MG tablet Take 70 mg by mouth every 7 (seven) days. Take with a full glass of water on an empty stomach. Sundays      . allopurinol (ZYLOPRIM) 300 MG tablet Take 300 mg by mouth every evening.      [name] allopurinol (ZYLOPRIM) 300 MG tablet TAKE 1 TABLET EVERY DAY  90 tablet  0  . amLODipine (NORVASC) 5 MG tablet Take 1  tablet (5 mg total) by mouth every evening.  90 tablet  6  . celecoxib (CELEBREX) 200 MG capsule Take 200 mg by mouth 2 (two) times daily.        . citalopram (CELEXA) 20 MG tablet Take 1 tablet (20 mg total) by mouth 2 (two) times daily.  60 tablet  6  . clopidogrel (PLAVIX) 75 MG tablet TAKE 1 TABLET BY MOUTH EVERY DAY  90 tablet  3  . COUMADIN 5 MG tablet TAKE AS DIRECTED BY ANTICOAGULATION CLINIC  120 tablet  1  . digoxin (LANOXIN) 0.125 MG tablet Take 0.125 mg by mouth every evening.       . donepezil (ARICEPT) 5 MG tablet Take 1 tablet (5 mg total) by mouth daily.  90 tablet  1  . famotidine (PEPCID) 20 MG tablet Take 20 mg by mouth daily as needed for heartburn.       . furosemide (LASIX) 80 MG tablet Take 1 tablet (80 mg total) by mouth 2 (two) times daily.  60 tablet  3  . KLOR-CON M20 20 MEQ tablet TAKE 1 TABLET BY MOUTH TWICE A DAY  180 tablet  1  . levothyroxine (SYNTHROID, LEVOTHROID) 25 MCG tablet Take 25 mcg by  mouth daily.      . metoprolol succinate (TOPROL-XL) 25 MG 24 hr tablet Take 25 mg by mouth once.      . nitroGLYCERIN (NITROSTAT) 0.4 MG SL tablet Place 1 tablet (0.4 mg total) under the tongue every 5 (five) minutes as needed for chest pain.  25 tablet  3  . nortriptyline (PAMELOR) 50 MG capsule Take 1 capsule (50 mg total) by mouth at bedtime.  90 capsule  4  . Pseudoephedrine-APAP-DM (DAYQUIL PO) Take 1 tablet by mouth once.      . valsartan (DIOVAN) 40 MG tablet Take 1 tablet (40 mg total) by mouth daily.  90 tablet  3   No current facility-administered medications for this visit.   Allergies  Allergen Reactions  . Ceclor [Cefaclor] Other (See Comments)    Reaction unknown  . Elastic Bandages & [Zinc] Other (See Comments)    Turns red on the areas it touches  . Ezetimibe     Myalgia  . Lipitor [Atorvastatin Calcium] Other (See Comments)    Increased fibromyalgia pain  . Mevacor [Lovastatin] Other (See Comments)    Increased fibromyalgia pain  . Pravachol  Other (See Comments)    Increased fibromyalgia pain  . Vasotec Other (See Comments)    Reaction unknown  . Welchol [Colesevelam Hcl]     Muscle aches  . Adhesive [Tape] Itching and Rash    Burning   . Latex Itching and Rash    Burning    Family History  Problem Relation Age of Onset  . Stroke Mother   . CAD Father   . Diabetes type II Brother   . CAD Brother

## 2013-06-28 NOTE — Assessment & Plan Note (Signed)
Patient agreeable to start Crestor 5 mg once daily.  Patient given 2 weeks of Crestor 5 mg samples and told to give me a call in 2 weeks with a status of how well she is tolerating it.  If she has problems before then, she will call and let me know, and I will try to get her enrolled into SPIRE.  I will try and get the [facility] to find her past medical records from Dr. Mare Ferrari regarding which statins were failed when, and at what dose in the past.  Will determine if we are to continue Crestor 5 mg daily or try to enroll into study when I hear back from patient in the next few weeks.

## 2013-06-29 ENCOUNTER — Other Ambulatory Visit: Payer: Self-pay | Admitting: *Deleted

## 2013-06-29 MED ORDER — METOPROLOL SUCCINATE ER 25 MG PO TB24: 25.0000 mg | ORAL_TABLET | Freq: Once | ORAL | Status: DC

## 2013-07-05 ENCOUNTER — Encounter: Payer: Self-pay | Admitting: Cardiology

## 2013-07-21 ENCOUNTER — Telehealth (HOSPITAL_COMMUNITY): Payer: Self-pay | Admitting: Interventional Radiology

## 2013-07-21 NOTE — Telephone Encounter (Signed)
Called pt told her that she would be due for next [place] in 6 months. She states understanding and is in agreement with this plan of care. JMichaux

## 2013-07-25 ENCOUNTER — Telehealth: Payer: Self-pay | Admitting: Cardiology

## 2013-07-25 NOTE — Telephone Encounter (Signed)
New message    Pt is having sob and swelling new symptoms.  Please call pt.  Nurse will fax copy of weights and symptoms

## 2013-07-25 NOTE — Telephone Encounter (Signed)
States she is having more swelling in her ankles. Some wheezing noted & pt has used inhaler. She already sleeps in her recliner.   She states the 80 mg lasix tablets are working well.   Weight today was 2 pounds heavier Pt states she is not concerned & is okay " I don't feel like it is an emergency"  She is coming in tomorrow for coumadin clinic. She will let us know her weight & how she is doing I will also forward this to Dr. Mare Ferrari for review.  Horton Chin RN

## 2013-07-25 NOTE — Telephone Encounter (Signed)
Take an extra lasix today then resume usual BID dose again tomorrow.

## 2013-07-25 NOTE — Telephone Encounter (Signed)
Pt is aware & will take an extra lasix today only Horton Chin RN

## 2013-07-26 ENCOUNTER — Ambulatory Visit (INDEPENDENT_AMBULATORY_CARE_PROVIDER_SITE_OTHER): Payer: Medicare Other

## 2013-07-26 ENCOUNTER — Other Ambulatory Visit: Payer: Self-pay | Admitting: Pulmonary Disease

## 2013-07-26 ENCOUNTER — Telehealth: Payer: Self-pay | Admitting: Pharmacist

## 2013-07-26 DIAGNOSIS — I6350 Cerebral infarction due to unspecified occlusion or stenosis of unspecified cerebral artery: Secondary | ICD-10-CM

## 2013-07-26 DIAGNOSIS — I4891 Unspecified atrial fibrillation: Secondary | ICD-10-CM

## 2013-07-26 LAB — POCT INR: INR: 1.6

## 2013-07-26 NOTE — Telephone Encounter (Signed)
Patient called to tell me she failed Crestor 5 mg qd due to muscle aches, and would like to try to get into PCSK-9 inhibitor study we spoke about last month.  She has also failed Lipitor, mevacor, pravachol, Zetia, and Welchol due to muscle aches.  She has a h/o FH (LDL 245 mg/dL recently), CABG 2000, and CVA in 2001.  This should be sufficient to get her in based on FH criteria as well.  She also has a h/o low HDL.  She understands everything about the study, and is excited to get in.  She started Crestor 5 mg qd 4 weeks ago, but after 1 week had to stop due to muscle aches.  Will likely need to wait another few weeks to screen her.  I've passed her name and information to [facility] and they will try to get her screened for [facility] when possible.  To Dr. Mare Ferrari as FYI only.

## 2013-08-09 ENCOUNTER — Other Ambulatory Visit: Payer: Self-pay | Admitting: *Deleted

## 2013-08-09 ENCOUNTER — Ambulatory Visit (INDEPENDENT_AMBULATORY_CARE_PROVIDER_SITE_OTHER): Payer: Medicare Other | Admitting: *Deleted

## 2013-08-09 DIAGNOSIS — I6350 Cerebral infarction due to unspecified occlusion or stenosis of unspecified cerebral artery: Secondary | ICD-10-CM

## 2013-08-09 DIAGNOSIS — I4891 Unspecified atrial fibrillation: Secondary | ICD-10-CM

## 2013-08-09 LAB — POCT INR: INR: 2.6

## 2013-08-24 ENCOUNTER — Telehealth: Payer: Self-pay | Admitting: Cardiology

## 2013-08-24 NOTE — Telephone Encounter (Signed)
She has atrial fibrillation and old stroke.  She would benefit from lovenox bridging. OK to leave off plavix for 5 days.

## 2013-08-24 NOTE — Telephone Encounter (Signed)
New message     Need clearance for lumbar epidural steroid injection---need to stop plavix and coumadin 5 days prior.  Will this be ok?

## 2013-08-25 ENCOUNTER — Telehealth: Payer: Self-pay | Admitting: *Deleted

## 2013-08-25 ENCOUNTER — Telehealth: Payer: Self-pay | Admitting: Pharmacist

## 2013-08-25 MED ORDER — WARFARIN SODIUM 5 MG PO TABS: 5.0000 mg | ORAL_TABLET | ORAL | Status: DC

## 2013-08-25 NOTE — Telephone Encounter (Signed)
Spoke with patient and she has started to weigh herself daily. Patient had a weight gain of 4 pounds for several days and wheezing. States she does wheeze at night when she "settles down" and uses her inhaler and then goes on to bed. With the weight gain she took an extra 40 mg and the next day her weight was down 2.3 pounds. Weight is now within 1 pound of baseline. Regular dose of Lasix is 80 mg twice a day. Will forward to  Dr. Mare Ferrari for review.

## 2013-08-25 NOTE — Telephone Encounter (Signed)
Follow up  ° ° ° °Returning call back to nurse  °

## 2013-08-25 NOTE — Telephone Encounter (Signed)
Spoke with pt and she will come in on 08/30/13 for Lovenox bridging. Epidural on 09/07/13, per Dr Mare Ferrari pt to hold coumadin and plavix for 5 days.

## 2013-08-25 NOTE — Telephone Encounter (Signed)
Left message to call back  

## 2013-08-25 NOTE — Telephone Encounter (Signed)
Patient was screened for SPIRE-II, PCSK-9 inhibitor trial, and unfortunately her CK level was 713 on first visit, and 918 on repeat.  She does have a h/o fibromyalgia.  Unfortunately CK > ~ 850 excludes her from [facility].   She has also failed Lipitor, Crestor 5 mg qd, mevacor, pravachol, Zetia, and Welchol due to muscle aches. She has a h/o FH (LDL 245 mg/dL recently), CABG 2000, and CVA in 2001.  [facility] has let Dr. Moreen Fowler (PCP) know about CK levels and that she doesn't qualify for SPIRE.  To Dr. Corlis Hove as FYI only.

## 2013-08-25 NOTE — Telephone Encounter (Signed)
Eunice Blase, waiting for patient to call back.

## 2013-08-25 NOTE — Telephone Encounter (Signed)
Advised patient and will forward to Coumadin Clinic for them to contact patient. Procedure scheduled for July 29

## 2013-08-25 NOTE — Telephone Encounter (Signed)
The patient has normal renal function.  We should increase her baseline dose of Lasix to 120 mg in the morning and 80 mg in the evening to prevent her from slipping back into CHF

## 2013-08-26 MED ORDER — FUROSEMIDE 40 MG PO TABS: ORAL_TABLET | ORAL | Status: DC

## 2013-08-26 NOTE — Telephone Encounter (Signed)
Advised patient of change. Patient would prefer to have a 40 mg and 80 mg tablet both. Sent Rx for 40 mg to pharmacy

## 2013-08-30 ENCOUNTER — Other Ambulatory Visit: Payer: Self-pay | Admitting: Cardiology

## 2013-08-30 ENCOUNTER — Ambulatory Visit (INDEPENDENT_AMBULATORY_CARE_PROVIDER_SITE_OTHER): Payer: Medicare Other

## 2013-08-30 DIAGNOSIS — I6350 Cerebral infarction due to unspecified occlusion or stenosis of unspecified cerebral artery: Secondary | ICD-10-CM

## 2013-08-30 DIAGNOSIS — I4891 Unspecified atrial fibrillation: Secondary | ICD-10-CM

## 2013-08-30 LAB — POCT INR: INR: 2.3

## 2013-08-30 MED ORDER — ENOXAPARIN SODIUM 100 MG/ML ~~LOC~~ SOLN: 100.0000 mg | SUBCUTANEOUS | Status: DC

## 2013-08-30 NOTE — Patient Instructions (Addendum)
Take your last dosage of Coumadin on 09/01/13. 09/02/13 No Coumadin, No Lovenox. 09/03/13 Start taking Lovenox 100mg  once daily at 8pm. 09/04/13 Take Lovenox 100mg  at 8pm. 09/05/13 Take last dosage Lovenox 100mg  at 8pm.  09/06/13 No Lovenox prior to procedure on 09/07/13. Resume Coumadin day of procedure if ok with MD, take and extra 1/2 tablet x 2 days then resume same dosage 1.5 tablets daily except 1 tablet on Tuesdays, Thursdays, and Saturdays. Resume Lovenox 100mg  in the am on 09/08/13 if ok with MD continue taking daily in the am until recheck INR on 09/12/13.

## 2013-09-07 ENCOUNTER — Ambulatory Visit: Payer: Medicare Other | Admitting: Pharmacist Clinician (PhC)/ Clinical Pharmacy Specialist

## 2013-09-09 ENCOUNTER — Other Ambulatory Visit: Payer: Self-pay | Admitting: Neurology

## 2013-09-11 ENCOUNTER — Other Ambulatory Visit: Payer: Self-pay | Admitting: Neurology

## 2013-09-12 ENCOUNTER — Ambulatory Visit (INDEPENDENT_AMBULATORY_CARE_PROVIDER_SITE_OTHER): Payer: Medicare Other

## 2013-09-12 DIAGNOSIS — I4891 Unspecified atrial fibrillation: Secondary | ICD-10-CM

## 2013-09-12 DIAGNOSIS — I6350 Cerebral infarction due to unspecified occlusion or stenosis of unspecified cerebral artery: Secondary | ICD-10-CM

## 2013-09-12 LAB — POCT INR: INR: 1.5

## 2013-09-19 ENCOUNTER — Other Ambulatory Visit: Payer: Self-pay | Admitting: Cardiology

## 2013-09-21 ENCOUNTER — Encounter: Payer: Self-pay | Admitting: Cardiology

## 2013-09-21 ENCOUNTER — Ambulatory Visit (INDEPENDENT_AMBULATORY_CARE_PROVIDER_SITE_OTHER): Payer: Medicare Other | Admitting: Pharmacist

## 2013-09-21 ENCOUNTER — Ambulatory Visit (INDEPENDENT_AMBULATORY_CARE_PROVIDER_SITE_OTHER): Payer: Medicare Other | Admitting: Cardiology

## 2013-09-21 ENCOUNTER — Other Ambulatory Visit: Payer: Medicare Other

## 2013-09-21 VITALS — BP 140/82 | HR 86 | Ht 64.0 in | Wt 130.0 lb

## 2013-09-21 DIAGNOSIS — R748 Abnormal levels of other serum enzymes: Secondary | ICD-10-CM

## 2013-09-21 DIAGNOSIS — I4891 Unspecified atrial fibrillation: Secondary | ICD-10-CM

## 2013-09-21 DIAGNOSIS — I5032 Chronic diastolic (congestive) heart failure: Secondary | ICD-10-CM

## 2013-09-21 DIAGNOSIS — E785 Hyperlipidemia, unspecified: Secondary | ICD-10-CM

## 2013-09-21 DIAGNOSIS — I119 Hypertensive heart disease without heart failure: Secondary | ICD-10-CM

## 2013-09-21 DIAGNOSIS — I6350 Cerebral infarction due to unspecified occlusion or stenosis of unspecified cerebral artery: Secondary | ICD-10-CM

## 2013-09-21 DIAGNOSIS — I25810 Atherosclerosis of coronary artery bypass graft(s) without angina pectoris: Secondary | ICD-10-CM

## 2013-09-21 DIAGNOSIS — I482 Chronic atrial fibrillation, unspecified: Secondary | ICD-10-CM

## 2013-09-21 DIAGNOSIS — E7800 Pure hypercholesterolemia, unspecified: Secondary | ICD-10-CM

## 2013-09-21 LAB — BASIC METABOLIC PANEL
BUN: 20 mg/dL (ref 6–23)
CALCIUM: 10.2 mg/dL (ref 8.4–10.5)
CO2: 29 mEq/L (ref 19–32)
CREATININE: 0.8 mg/dL (ref 0.4–1.2)
Chloride: 98 mEq/L (ref 96–112)
GFR: 69.89 mL/min (ref >60.00)
GLUCOSE: 106 mg/dL — AB (ref 70–99)
Potassium: 4.1 mEq/L (ref 3.5–5.1)
SODIUM: 138 meq/L (ref 135–145)

## 2013-09-21 LAB — LIPID PANEL
Cholesterol: 458 mg/dL — ABNORMAL HIGH (ref 0–200)
HDL: 62.1 mg/dL (ref >39.00)
NONHDL: 395.9
Total CHOL/HDL Ratio: 7
Triglycerides: 217 mg/dL — ABNORMAL HIGH (ref 0.0–149.0)
VLDL: 43.4 mg/dL — ABNORMAL HIGH (ref 0.0–40.0)

## 2013-09-21 LAB — HEPATIC FUNCTION PANEL
ALBUMIN: 4.6 g/dL (ref 3.5–5.2)
ALT: 31 U/L (ref 0–35)
AST: 38 U/L — ABNORMAL HIGH (ref 0–37)
Alkaline Phosphatase: 45 U/L (ref 39–117)
BILIRUBIN DIRECT: 0.1 mg/dL (ref 0.0–0.3)
TOTAL PROTEIN: 7.6 g/dL (ref 6.0–8.3)
Total Bilirubin: 0.5 mg/dL (ref 0.2–1.2)

## 2013-09-21 LAB — CK: Total CK: 734 U/L — ABNORMAL HIGH (ref 7–177)

## 2013-09-21 LAB — POCT INR: INR: 2.1

## 2013-09-21 NOTE — Assessment & Plan Note (Addendum)
Blood pressure has been remaining stable on current therapy.  She has been able to cut back on her furosemide to just 80 mg twice a day.  She is not having any dizzy spells or syncope.

## 2013-09-21 NOTE — Assessment & Plan Note (Signed)
The patient has had no recurrent chest pain or angina 

## 2013-09-21 NOTE — Assessment & Plan Note (Signed)
The patient is in chronic established atrial fibrillation.  She is on long-term Coumadin.  She is also on Plavix.  She has not been having bleeding problems.  She has had no TIA symptoms.

## 2013-09-21 NOTE — Progress Notes (Signed)
Amber [name] Date of Birth:  Jul 14, 1934 [facility] [address] [place], [place]  [address] [phone]        Fax   [phone]   History of Present Illness: This pleasant 79 year old woman is seen for a scheduled followup office visit. She was hospitalized 02/12/13 until 02/19/13 for acute pneumococcal pneumonia. She has made a good recovery from the pneumonia . She has a complex past medical history. She has known ischemic heart disease with coronary artery bypass graft surgery in 2000. She has had remote stroke with residual left hemiparesis. She's had a long history of high blood pressure and high cholesterol. She has established atrial fibrillation and is on Coumadin. She has chronically elevated CK levels and she has a history of fibromyalgia. She is followed for this by Dr. Lenna Gilford, rheumatologist with [facility]. She does not tolerate statins. She's had congestive heart failure secondary to diastolic dysfunction. She was cardioverted in June 2012 but remained in sinus rhythm for less than one week. She is now in established chronic atrial fibrillation on Coumadin.  The patient has a history of COPD. She recently saw Dr. Halford Chessman who felt that most of her dyspnea was probably related to worsening congestive heart failure rather than her lung disease. A chest x-ray on 08/06/12 showed x-ray findings of mild CHF. The patient has improved since increasing her furosemide to a total daily dose of 160 mg. previously we had switched her from Diovan to losartan because of cost . However, she did not do as well and we switched her back to Diovan and she feels better . The patient still has very little stamina when she walks.  The patient had an echocardiogram on 08/12/12 showing an ejection fraction of 60-65%. She also had a Lexa scan Myoview on 08/25/12 which showed no evidence of ischemia and she does have an old inferior wall myocardial infarction scar. She had a  repeat Lexi scan Myoview on 03/23/13 which showed findings similar to July 2014. There is inferior wall scar with surrounding peri-infarct ischemia.  Since last visit the patient has had no new cardiac symptoms. She has persistently high cholesterol levels and is intolerant of all statin drugs as well as ezetimibe.  She was considered for one of the drug studies with the PCS K9 injections but was not qualified for it because of her high CK levels.   Current Outpatient Prescriptions  Medication Sig Dispense Refill  . albuterol (PROVENTIL HFA;VENTOLIN HFA) 108 (90 BASE) MCG/ACT inhaler Inhale 2 puffs into the lungs every 6 (six) hours as needed for wheezing or shortness of breath.  3 Inhaler  11  . alendronate (FOSAMAX) 70 MG tablet Take 70 mg by mouth every 7 (seven) days. Take with a full glass of water on an empty stomach. Sundays      . allopurinol (ZYLOPRIM) 300 MG tablet Take 300 mg by mouth every evening.      [name] amLODipine (NORVASC) 5 MG tablet Take 1 tablet (5 mg total) by mouth every evening.  90 tablet  6  . celecoxib (CELEBREX) 200 MG capsule Take 200 mg by mouth 2 (two) times daily.        . citalopram (CELEXA) 20 MG tablet Take 1 tablet (20 mg total) by mouth 2 (two) times daily.  60 tablet  6  . clopidogrel (PLAVIX) 75 MG tablet TAKE 1 TABLET BY MOUTH EVERY DAY  90 tablet  3  . digoxin (LANOXIN) 0.125  MG tablet Take 0.125 mg by mouth every evening.       . donepezil (ARICEPT) 5 MG tablet TAKE 1 TABLET (5 MG TOTAL) BY MOUTH DAILY.  90 tablet  1  . famotidine (PEPCID) 20 MG tablet Take 20 mg by mouth daily as needed for heartburn.       . furosemide (LASIX) 40 MG tablet Take 1 tablet in the morning with the Furosemide 80 mg tablet  90 tablet  3  . furosemide (LASIX) 40 MG tablet TAKE 3 TABLETS (120 MG TOTAL) BY MOUTH EVERY MORNING.  270 tablet  3  . furosemide (LASIX) 80 MG tablet Take 80 mg by mouth as directed. Take 80 mg with a 40 mg tablet in the morning and 80 mg in the evening.        [name] KLOR-CON M20 20 MEQ tablet TAKE 1 TABLET BY MOUTH TWICE A DAY  180 tablet  1  . levothyroxine (SYNTHROID, LEVOTHROID) 25 MCG tablet Take 25 mcg by mouth daily.      . metoprolol succinate (TOPROL-XL) 25 MG 24 hr tablet Take 1 tablet (25 mg total) by mouth once.  90 tablet  0  . nitroGLYCERIN (NITROSTAT) 0.4 MG SL tablet Place 1 tablet (0.4 mg total) under the tongue every 5 (five) minutes as needed for chest pain.  25 tablet  3  . nortriptyline (PAMELOR) 50 MG capsule Take 1 capsule (50 mg total) by mouth at bedtime.  90 capsule  4  . PROAIR HFA 108 (90 BASE) MCG/ACT inhaler INHALE 2 PUFFS INTO LUNGS EVERY SIX HOURS AS NEEDED FOR WHEEZING  8.5 each  0  . Pseudoephedrine-APAP-DM (DAYQUIL PO) Take 1 tablet by mouth once.      . valsartan (DIOVAN) 40 MG tablet TAKE 1 TABLET BY MOUTH DAILY.  90 tablet  0  . warfarin (COUMADIN) 5 MG tablet Take 1 tablet (5 mg total) by mouth as directed.  120 tablet  1  . allopurinol (ZYLOPRIM) 300 MG tablet TAKE 1 TABLET EVERY DAY  90 tablet  0  . donepezil (ARICEPT) 5 MG tablet TAKE 1 TABLET (5 MG TOTAL) BY MOUTH DAILY.  90 tablet  1  . enoxaparin (LOVENOX) 100 MG/ML injection Inject 1 mL (100 mg total) into the skin daily. As instructed  10 Syringe  1   No current facility-administered medications for this visit.    Allergies  Allergen Reactions  . Ceclor [Cefaclor] Other (See Comments)    Reaction unknown  . Elastic Bandages & [Zinc] Other (See Comments)    Turns red on the areas it touches  . Ezetimibe     Myalgia  . Lipitor [Atorvastatin Calcium] Other (See Comments)    Increased fibromyalgia pain  . Mevacor [Lovastatin] Other (See Comments)    Increased fibromyalgia pain  . Pravachol Other (See Comments)    Increased fibromyalgia pain  . Vasotec Other (See Comments)    Reaction unknown  . Welchol [Colesevelam Hcl]     Muscle aches  . Adhesive [Tape] Itching and Rash    Burning   . Latex Itching and Rash    Burning     Patient Active  Problem List   Diagnosis Date Noted  . Diastolic heart failure secondary to hypertension 06/27/2010    Priority: High  . Benign hypertensive heart disease without heart failure 05/29/2010    Priority: High  . Atrial fibrillation 05/06/2010    Priority: High  . Fibromyalgia 05/10/2010    Priority: Medium  .  Anemia 05/10/2010    Priority: Medium  . Delirium due to multiple etiologies 02/14/2013  . 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 Long-term (current) use of anticoagulants   . Streptococcal pneumonia 02/12/2013  . Vertebral artery stenosis 12/18/2011  . COPD (chronic obstructive pulmonary disease) 06/04/2010  . Insomnia due to mental condition 05/10/2010  . Depression 05/10/2010  . Cervical spinal stenosis 05/10/2010  . Hypothyroidism 05/10/2010  . Osteoarthritis 05/10/2010  . Osteoporosis 05/10/2010  . S/P CABG (coronary artery bypass graft) 05/10/2010  . Chronic diastolic heart failure   . Dyslipidemia   . CAD (coronary artery disease)   . Unspecified cerebral artery occlusion with cerebral infarction 05/06/2010    History  Smoking status  . Former Smoker -- 1.50 packs/day for 25 years  . Types: Cigarettes  . Quit date: 02/10/1977  Smokeless tobacco  . Never Used    History  Alcohol Use No    Family History  Problem Relation Age of Onset  . Stroke Mother   . CAD Father   . Diabetes type II Brother   . CAD Brother     Review of Systems: Constitutional: no fever chills diaphoresis or fatigue or change in weight.  Head and neck: no hearing loss, no epistaxis, no photophobia or visual disturbance. Respiratory: No cough, shortness of breath or wheezing. Cardiovascular: No chest pain peripheral edema, palpitations. Gastrointestinal: No abdominal distention, no abdominal pain, no change in bowel habits hematochezia or melena. Genitourinary: No dysuria, no frequency, no urgency, no nocturia. Musculoskeletal:No arthralgias, no back pain, no gait disturbance or  myalgias. Neurological: No dizziness, no headaches, no numbness, no seizures, no syncope, no weakness, no tremors. Hematologic: No lymphadenopathy, no easy bruising. Psychiatric: No confusion, no hallucinations, no sleep disturbance.    Physical Exam: Filed Vitals:   09/21/13 1420  BP: 140/82  Pulse: 86   the general appearance reveals a well-developed well-nourished woman in no distress.The head and neck exam reveals pupils equal and reactive.  Extraocular movements are full.  There is no scleral icterus.  The mouth and pharynx are normal.  The neck is supple.  The carotids reveal no bruits.  The jugular venous pressure is normal.  The  thyroid is not enlarged.  There is no lymphadenopathy.  The chest is clear to percussion and auscultation.  There are no rales or rhonchi.  Expansion of the chest is symmetrical.  The precordium is quiet.  The first heart sound is normal.  The second heart sound is physiologically split.  There is no murmur gallop rub or click.  There is no abnormal lift or heave.  The abdomen is soft and nontender.  The bowel sounds are normal.  The liver and spleen are not enlarged.  There are no abdominal masses.  There are no abdominal bruits.  Extremities reveal good pedal pulses.  There is no phlebitis or edema.  There is no cyanosis or clubbing.  Strength reveals that the left lower leg is in a brace.  There is no lateralizing weakness.  There are no sensory deficits.  The skin is warm and dry.  There is no rash.     Assessment / Plan: 1. ischemic heart disease with CABG in 2000 2. history of CVA in 2001 3. permanent atrial fibrillation on Coumadin 4. dyslipidemia with intolerance to statins and other cholesterol lowering medicine 5. history of fibromyalgia and chronically elevated CK levels 6. Hypothyroidism 7. chronic diastolic heart failure with ejection fraction of 60-65% 8.  COPD 9. mild memory impairment,  on donepezil  Plan: We're checking lab work today to  be same medication.  Continue Lasix 80 mg twice a day.  His extra Lasix when necessary for fluid retention.  Recheck in 3 months for office visit lipid panel hepatic function panel and basal metabolic panel.

## 2013-09-21 NOTE — Assessment & Plan Note (Signed)
Patient has severe dyslipidemia.  She is unable to tolerate statin therapy.  She has tried all the statins as well as ezetimibe and WelChol.  She was not able to get into the PCSK9 study because of her elevated CK levels

## 2013-09-21 NOTE — Patient Instructions (Signed)
Will obtain labs today and call you with the results (lp/bmet/hfp/ck)  Your physician recommends that you continue on your current medications as directed. Please refer to the Current Medication list given to you today.  Your physician recommends that you schedule a follow-up appointment in: 3 months with fasting labs (LP/BMET/HFP)

## 2013-09-22 LAB — LDL CHOLESTEROL, DIRECT: LDL DIRECT: 378 mg/dL

## 2013-09-23 ENCOUNTER — Telehealth: Payer: Self-pay | Admitting: Pharmacist

## 2013-09-23 NOTE — Telephone Encounter (Signed)
Dr. Mare Ferrari,  Following up on our conversation about her cholesterol.  She was excluded from [facility] given h/o elevated CK levels.  She does have Medicare prescription coverage through [facility], which is not covering Praluent (PCSK-9 inhibitor) at this time.  The second of these agents will likely hit the market in the next month.  They would like to see patient on "highest tolerated statin dose" in order for insurance to consider paying.    She has also failed Lipitor, Crestor 5 mg qd, mevacor, pravachol, Zetia, and Welchol due to muscle aches. She has a h/o FH (LDL 250-350 mg/dL recently), CABG 2000, and CVA in 2001.  I suggest having her take Crestor 5 mg once weekly for a month, then increase to twice monthly.  Typically patient's with fibromyalgia can tolerate these low doses, and can achieve a 20-25% LDL reduction with this dose.  This would also have her on statin, which may help with getting her on Praluent in the future once coverage improves.  I would recheck lipid / liver panel in 3 months and consider PCSK-9 inhibitor at that time based on insurance coverage as 2 of these agents will be on the market.

## 2013-09-23 NOTE — Telephone Encounter (Signed)
Tried to call patient multiple times, line busy

## 2013-09-23 NOTE — Telephone Encounter (Signed)
Please instruct the patient to try Crestor again in very low dosage as outlined in Dr. Mickel Baas note: 5 mg Crestor once a week initially and then build up to twice a week.

## 2013-09-26 ENCOUNTER — Other Ambulatory Visit: Payer: Self-pay | Admitting: Cardiology

## 2013-09-29 NOTE — Telephone Encounter (Signed)
ptcb and has been notified about lab results with verbal understanding. Pt proceeds to to tell me that she broke her Tibia, was advised by that doctor to stay off for 3 weeks, she does not think she  can keep [facility] appt. Advised CVRR cb to advise.

## 2013-09-29 NOTE — Telephone Encounter (Signed)
Called spoke with pt, reports fall and fracture of tibia on 09/22/13.  Pt is scheduled to f/u with ortho MD on 10/13/13.  Pt has been advised to stay off leg as much as possible.  Advised pt she is not scheduled for 4 week f/u until 10/19/13.  Pt thought f/u appt was sooner, will keep scheduled appt on 10/19/13 at 12:45pm.

## 2013-09-29 NOTE — Telephone Encounter (Signed)
lmptcb on both home and cell for lab results and recommendations

## 2013-09-30 ENCOUNTER — Other Ambulatory Visit: Payer: Self-pay

## 2013-10-10 ENCOUNTER — Other Ambulatory Visit: Payer: Self-pay | Admitting: Cardiology

## 2013-10-19 ENCOUNTER — Ambulatory Visit (INDEPENDENT_AMBULATORY_CARE_PROVIDER_SITE_OTHER): Payer: Medicare Other | Admitting: *Deleted

## 2013-10-19 DIAGNOSIS — I4891 Unspecified atrial fibrillation: Secondary | ICD-10-CM

## 2013-10-19 DIAGNOSIS — I6350 Cerebral infarction due to unspecified occlusion or stenosis of unspecified cerebral artery: Secondary | ICD-10-CM

## 2013-10-19 LAB — POCT INR: INR: 2

## 2013-11-02 ENCOUNTER — Other Ambulatory Visit: Payer: Self-pay | Admitting: Cardiology

## 2013-11-16 ENCOUNTER — Ambulatory Visit (INDEPENDENT_AMBULATORY_CARE_PROVIDER_SITE_OTHER): Payer: Medicare Other | Admitting: Pharmacist

## 2013-11-16 DIAGNOSIS — I6350 Cerebral infarction due to unspecified occlusion or stenosis of unspecified cerebral artery: Secondary | ICD-10-CM

## 2013-11-16 DIAGNOSIS — I639 Cerebral infarction, unspecified: Secondary | ICD-10-CM

## 2013-11-16 DIAGNOSIS — I4891 Unspecified atrial fibrillation: Secondary | ICD-10-CM

## 2013-11-16 LAB — POCT INR: INR: 1.8

## 2013-12-09 ENCOUNTER — Ambulatory Visit (INDEPENDENT_AMBULATORY_CARE_PROVIDER_SITE_OTHER): Payer: Medicare Other | Admitting: Pharmacist

## 2013-12-09 DIAGNOSIS — I4891 Unspecified atrial fibrillation: Secondary | ICD-10-CM

## 2013-12-09 DIAGNOSIS — I6350 Cerebral infarction due to unspecified occlusion or stenosis of unspecified cerebral artery: Secondary | ICD-10-CM

## 2013-12-09 DIAGNOSIS — I639 Cerebral infarction, unspecified: Secondary | ICD-10-CM

## 2013-12-09 LAB — POCT INR: INR: 2.6

## 2013-12-12 ENCOUNTER — Other Ambulatory Visit: Payer: Self-pay | Admitting: Pulmonary Disease

## 2013-12-13 ENCOUNTER — Ambulatory Visit: Payer: Medicare Other | Admitting: Neurology

## 2013-12-16 ENCOUNTER — Other Ambulatory Visit: Payer: Self-pay | Admitting: Cardiology

## 2013-12-22 ENCOUNTER — Other Ambulatory Visit: Payer: Self-pay | Admitting: Cardiology

## 2013-12-22 DIAGNOSIS — J441 Chronic obstructive pulmonary disease with (acute) exacerbation: Secondary | ICD-10-CM

## 2013-12-28 ENCOUNTER — Encounter: Payer: Self-pay | Admitting: Neurology

## 2014-01-03 ENCOUNTER — Encounter: Payer: Self-pay | Admitting: Neurology

## 2014-01-03 ENCOUNTER — Ambulatory Visit (INDEPENDENT_AMBULATORY_CARE_PROVIDER_SITE_OTHER): Payer: Medicare Other | Admitting: Cardiology

## 2014-01-03 ENCOUNTER — Ambulatory Visit (INDEPENDENT_AMBULATORY_CARE_PROVIDER_SITE_OTHER): Payer: Medicare Other | Admitting: *Deleted

## 2014-01-03 VITALS — BP 158/86 | HR 68 | Ht 64.0 in | Wt 170.0 lb

## 2014-01-03 DIAGNOSIS — I5030 Unspecified diastolic (congestive) heart failure: Secondary | ICD-10-CM

## 2014-01-03 DIAGNOSIS — I110 Hypertensive heart disease with heart failure: Secondary | ICD-10-CM

## 2014-01-03 DIAGNOSIS — I6350 Cerebral infarction due to unspecified occlusion or stenosis of unspecified cerebral artery: Secondary | ICD-10-CM

## 2014-01-03 DIAGNOSIS — I482 Chronic atrial fibrillation, unspecified: Secondary | ICD-10-CM

## 2014-01-03 DIAGNOSIS — E785 Hyperlipidemia, unspecified: Secondary | ICD-10-CM

## 2014-01-03 DIAGNOSIS — I119 Hypertensive heart disease without heart failure: Secondary | ICD-10-CM

## 2014-01-03 DIAGNOSIS — R413 Other amnesia: Secondary | ICD-10-CM

## 2014-01-03 DIAGNOSIS — E7800 Pure hypercholesterolemia, unspecified: Secondary | ICD-10-CM

## 2014-01-03 DIAGNOSIS — I4891 Unspecified atrial fibrillation: Secondary | ICD-10-CM

## 2014-01-03 DIAGNOSIS — I639 Cerebral infarction, unspecified: Secondary | ICD-10-CM

## 2014-01-03 DIAGNOSIS — I5032 Chronic diastolic (congestive) heart failure: Secondary | ICD-10-CM

## 2014-01-03 LAB — HEPATIC FUNCTION PANEL
ALK PHOS: 49 U/L (ref 39–117)
ALT: 32 U/L (ref 0–35)
AST: 49 U/L — ABNORMAL HIGH (ref 0–37)
Albumin: 4.5 g/dL (ref 3.5–5.2)
BILIRUBIN DIRECT: 0.1 mg/dL (ref 0.0–0.3)
BILIRUBIN TOTAL: 0.8 mg/dL (ref 0.2–1.2)
TOTAL PROTEIN: 7.1 g/dL (ref 6.0–8.3)

## 2014-01-03 LAB — BASIC METABOLIC PANEL
BUN: 13 mg/dL (ref 6–23)
CO2: 31 mEq/L (ref 19–32)
Calcium: 9.1 mg/dL (ref 8.4–10.5)
Chloride: 97 mEq/L (ref 96–112)
Creatinine, Ser: 0.8 mg/dL (ref 0.4–1.2)
GFR: 74.96 mL/min (ref >60.00)
GLUCOSE: 95 mg/dL (ref 70–99)
Potassium: 3.7 mEq/L (ref 3.5–5.1)
Sodium: 138 mEq/L (ref 135–145)

## 2014-01-03 LAB — LIPID PANEL
Cholesterol: 353 mg/dL — ABNORMAL HIGH (ref 0–200)
HDL: 38.1 mg/dL — ABNORMAL LOW (ref >39.00)
NonHDL: 314.9
Total CHOL/HDL Ratio: 9
Triglycerides: 399 mg/dL — ABNORMAL HIGH (ref 0.0–149.0)
VLDL: 79.8 mg/dL — ABNORMAL HIGH (ref 0.0–40.0)

## 2014-01-03 LAB — POCT INR: INR: 2.2

## 2014-01-03 MED ORDER — VALSARTAN 40 MG PO TABS: 40.0000 mg | ORAL_TABLET | Freq: Two times a day (BID) | ORAL | Status: DC

## 2014-01-03 NOTE — Assessment & Plan Note (Signed)
Her blood pressure today was elevated and on repeat was still elevated in the range of 160/80 taken by me. We will increase her ARB Diovan up to 40 mg  Twice a day

## 2014-01-03 NOTE — Assessment & Plan Note (Signed)
She is not having any evidence of worsening CHF. She does not have any peripheral edema. We are checking lab work today.

## 2014-01-03 NOTE — Assessment & Plan Note (Addendum)
Patient remains on long-term anticoagulation  With warfarin.  She is not having any TIA symptoms.

## 2014-01-03 NOTE — Patient Instructions (Signed)
Will obtain labs today and call you with the results (LP/BMET/HFP)  INCREASE YOUR VALSARTAN TO 40 MG TWICE A DAY   Your physician recommends that you schedule a follow-up appointment in: 3 MONTH OV/EKG/BMET  WILL ARRANGE FOR YOU TO SEE DR Cullomburg

## 2014-01-03 NOTE — Progress Notes (Signed)
Amber [name] Date of Birth:  [birth year] [facility] [address] [place], [place]  [address] [phone]        Fax   [phone]   History of Present Illness: This pleasant 79 year old woman is seen for a scheduled followup office visit. She was hospitalized 02/12/13 until 02/19/13 for acute pneumococcal pneumonia. She has made a good recovery from the pneumonia . She has a complex past medical history. She has known ischemic heart disease with coronary artery bypass graft surgery in 2000. She has had remote stroke with residual left hemiparesis. She's had a long history of high blood pressure and high cholesterol. She has established atrial fibrillation and is on Coumadin. She has chronically elevated CK levels and she has a history of fibromyalgia. She is followed for this by Dr. Lenna Gilford, rheumatologist with [facility]. She does not tolerate statins. She's had congestive heart failure secondary to diastolic dysfunction. She was cardioverted in June 2012 but remained in sinus rhythm for less than one week. She is now in established chronic atrial fibrillation on Coumadin.  The patient has a history of COPD. She recently saw Dr. Halford Chessman who felt that most of her dyspnea was probably related to worsening congestive heart failure rather than her lung disease. A chest x-ray on 08/06/12 showed x-ray findings of mild CHF. The patient has improved since increasing her furosemide to a total daily dose of 160 mg. previously we had switched her from Diovan to losartan because of cost . However, she did not do as well and we switched her back to Diovan and she feels better . The patient still has very little stamina when she walks.  The patient had an echocardiogram on 08/12/12 showing an ejection fraction of 60-65%. She also had a Lexa scan Myoview on 08/25/12 which showed no evidence of ischemia and she does have an old inferior wall myocardial infarction scar. She had a  repeat Lexi scan Myoview on 03/23/13 which showed findings similar to July 2014. There is inferior wall scar with surrounding peri-infarct ischemia.  Since last visit the patient has had no new cardiac symptoms. She has persistently high cholesterol levels and is intolerant of all statin drugs as well as ezetimibe.  She was considered for one of the drug studies with the PCS K9 injections but was not qualified for it because of her high CK levels.  she has a history of an old stroke. She has an  Area of intracranial stenosis followed by Dr. Syble Creek. Her previous neurologist was Dr. Erling Cruz. She is requesting Dr. Jannifer Franklin and we will request a consult for her. The patient is also having some problems with memory loss. Memory loss runs in her family.   Current Outpatient Prescriptions  Medication Sig Dispense Refill  . albuterol (PROVENTIL HFA;VENTOLIN HFA) 108 (90 BASE) MCG/ACT inhaler Inhale 2 puffs into the lungs every 6 (six) hours as needed for wheezing or shortness of breath. 3 Inhaler 11  . alendronate (FOSAMAX) 70 MG tablet Take 70 mg by mouth every 7 (seven) days. Take with a full glass of water on an empty stomach. Sundays    . allopurinol (ZYLOPRIM) 300 MG tablet TAKE 1 TABLET EVERY DAY 90 tablet 2  . amitriptyline (ELAVIL) 50 MG tablet Take 50 mg by mouth daily.     [name] amLODipine (NORVASC) 5 MG tablet Take 1 tablet (5 mg total) by mouth every evening. 90 tablet 6  . celecoxib (CELEBREX) 200 MG capsule  Take 200 mg by mouth 2 (two) times daily.      . citalopram (CELEXA) 20 MG tablet Take 1 tablet (20 mg total) by mouth 2 (two) times daily. 60 tablet 6  . clopidogrel (PLAVIX) 75 MG tablet TAKE 1 TABLET BY MOUTH EVERY DAY 90 tablet 3  . digoxin (LANOXIN) 0.125 MG tablet TAKE 1 TABLET EVERY DAY 90 tablet 2  . enoxaparin (LOVENOX) 100 MG/ML injection Inject 1 mL (100 mg total) into the skin daily. As instructed 10 Syringe 1  . famotidine (PEPCID) 20 MG tablet Take 20 mg by mouth daily as needed for  heartburn.     . furosemide (LASIX) 80 MG tablet Take 80 mg by mouth as directed. Take 80 mg with a 40 mg tablet in the morning and 80 mg in the evening.    [name] KLOR-CON M20 20 MEQ tablet TAKE 1 TABLET BY MOUTH TWICE A DAY 180 tablet 1  . levothyroxine (SYNTHROID, LEVOTHROID) 25 MCG tablet Take 25 mcg by mouth daily.    . metoprolol succinate (TOPROL-XL) 25 MG 24 hr tablet TAKE 1 TABLET (25 MG TOTAL) BY MOUTH ONCE. 90 tablet 2  . nitroGLYCERIN (NITROSTAT) 0.4 MG SL tablet Place 1 tablet (0.4 mg total) under the tongue every 5 (five) minutes as needed for chest pain. 25 tablet 3  . nortriptyline (PAMELOR) 50 MG capsule Take 1 capsule (50 mg total) by mouth at bedtime. 90 capsule 4  . PROAIR HFA 108 (90 BASE) MCG/ACT inhaler INHALE 2 PUFFS INTO LUNGS EVERY 6 HOURS AS NEEDED FOR WHEEZING 8.5 each 0  . valsartan (DIOVAN) 40 MG tablet Take 1 tablet (40 mg total) by mouth 2 (two) times daily. 180 tablet 3  . Vitamin D, Ergocalciferol, (DRISDOL) 50000 UNITS CAPS capsule     . warfarin (COUMADIN) 5 MG tablet Take 1 tablet (5 mg total) by mouth as directed. 120 tablet 1   No current facility-administered medications for this visit.    Allergies  Allergen Reactions  . Ceclor [Cefaclor] Other (See Comments)    Reaction unknown  . Elastic Bandages & [Zinc] Other (See Comments)    Turns red on the areas it touches  . Ezetimibe     Myalgia  . Lipitor [Atorvastatin Calcium] Other (See Comments)    Increased fibromyalgia pain  . Mevacor [Lovastatin] Other (See Comments)    Increased fibromyalgia pain  . Pravachol Other (See Comments)    Increased fibromyalgia pain  . Vasotec Other (See Comments)    Reaction unknown  . Welchol [Colesevelam Hcl]     Muscle aches  . Adhesive [Tape] Itching and Rash    Burning   . Latex Itching and Rash    Burning     Patient Active Problem List   Diagnosis Date Noted  . Diastolic heart failure secondary to hypertension 06/27/2010    Priority: High  . Benign  hypertensive heart disease without heart failure 05/29/2010    Priority: High  . Atrial fibrillation 05/06/2010    Priority: High  . Fibromyalgia 05/10/2010    Priority: Medium  . Anemia 05/10/2010    Priority: Medium  . Delirium due to multiple etiologies 02/14/2013  . 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 Long-term (current) use of anticoagulants   . Streptococcal pneumonia 02/12/2013  . Vertebral artery stenosis 12/18/2011  . COPD (chronic obstructive pulmonary disease) 06/04/2010  . Insomnia due to mental condition 05/10/2010  . Depression 05/10/2010  . Cervical spinal stenosis 05/10/2010  . Hypothyroidism 05/10/2010  .  Osteoarthritis 05/10/2010  . Osteoporosis 05/10/2010  . S/P CABG (coronary artery bypass graft) 05/10/2010  . Chronic diastolic heart failure   . Dyslipidemia   . CAD (coronary artery disease)   . Unspecified cerebral artery occlusion with cerebral infarction 05/06/2010    History  Smoking status  . Former Smoker -- 1.50 packs/day for 25 years  . Types: Cigarettes  . Quit date: 02/10/1977  Smokeless tobacco  . Never Used    History  Alcohol Use No    Family History  Problem Relation Age of Onset  . Stroke Mother   . CAD Father   . Diabetes type II Brother   . CAD Brother     Review of Systems: Constitutional: no fever chills diaphoresis or fatigue or change in weight.  Head and neck: no hearing loss, no epistaxis, no photophobia or visual disturbance. Respiratory: No cough, shortness of breath or wheezing. Cardiovascular: No chest pain peripheral edema, palpitations. Gastrointestinal: No abdominal distention, no abdominal pain, no change in bowel habits hematochezia or melena. Genitourinary: No dysuria, no frequency, no urgency, no nocturia. Musculoskeletal:No arthralgias, no back pain, no gait disturbance or myalgias. Neurological: No dizziness, no headaches, no numbness, no seizures, no syncope, no weakness, no tremors. Hematologic: No  lymphadenopathy, no easy bruising. Psychiatric: No confusion, no hallucinations, no sleep disturbance.    Physical Exam: Filed Vitals:   01/03/14 1445  BP: 158/86  Pulse: 68   the general appearance reveals a well-developed well-nourished woman in no distress.The head and neck exam reveals pupils equal and reactive.  Extraocular movements are full.  There is no scleral icterus.  The mouth and pharynx are normal.  The neck is supple.  The carotids reveal no bruits.  The jugular venous pressure is normal.  The  thyroid is not enlarged.  There is no lymphadenopathy.  The chest is clear to percussion and auscultation.  There are no rales or rhonchi.  Expansion of the chest is symmetrical.  The precordium is quiet.  The first heart sound is normal.  The second heart sound is physiologically split.  There is no murmur gallop rub or click.  There is no abnormal lift or heave.  The abdomen is soft and nontender.  The bowel sounds are normal.  The liver and spleen are not enlarged.  There are no abdominal masses.  There are no abdominal bruits.  Extremities reveal good pedal pulses.  There is no phlebitis or edema.  There is no cyanosis or clubbing.  Strength reveals that the left lower leg is in a brace.  There is no lateralizing weakness.  There are no sensory deficits.  The skin is warm and dry.  There is no rash.     Assessment / Plan: 1. ischemic heart disease with CABG in 2000 2. history of CVA in 2001 3. permanent atrial fibrillation on Coumadin 4. dyslipidemia with intolerance to statins and other cholesterol lowering medicine 5. history of fibromyalgia and chronically elevated CK levels 6. Hypothyroidism 7. chronic diastolic heart failure with ejection fraction of 60-65% 8.  COPD 9. mild memory impairment, on donepezil  Plan: We're checking lab work today.  Continue Lasix 80 mg twice a day.  His extra Lasix when necessary for fluid retention. Increase Diovan 40 mg 2 twice a day for high  blood pressure.  Recheck in 3 months for office visit  EKG and basal metabolic panel.  referral to Dr. Jannifer Franklin for ongoing neurology follow-up for her old stroke and for evaluation of her  memory issues

## 2014-01-04 LAB — LDL CHOLESTEROL, DIRECT: Direct LDL: 204.7 mg/dL

## 2014-01-04 NOTE — Progress Notes (Signed)
Quick Note:  Please report to patient. The recent labs are stable. Continue same medication and careful diet. Cholesterol is better than last time. ______

## 2014-01-23 ENCOUNTER — Ambulatory Visit (INDEPENDENT_AMBULATORY_CARE_PROVIDER_SITE_OTHER): Payer: Medicare Other | Admitting: Neurology

## 2014-01-23 ENCOUNTER — Encounter: Payer: Self-pay | Admitting: Neurology

## 2014-01-23 VITALS — BP 149/72 | HR 76 | Ht 64.5 in | Wt 166.0 lb

## 2014-01-23 DIAGNOSIS — R413 Other amnesia: Secondary | ICD-10-CM

## 2014-01-23 HISTORY — DX: Other amnesia: R41.3

## 2014-01-23 MED ORDER — AMITRIPTYLINE HCL 25 MG PO TABS: 25.0000 mg | ORAL_TABLET | Freq: Every day | ORAL | Status: DC

## 2014-01-23 MED ORDER — MEMANTINE HCL ER 7 MG PO CP24: ORAL_CAPSULE | ORAL | Status: DC

## 2014-01-23 NOTE — Progress Notes (Signed)
Reason for visit: Memory disturbance  Amber [name] is a 79 y.o. female  History of present illness:  Ms. Kitko is a 79 year old right-handed white female with a history of cerebrovascular disease with a pontine stroke and associated left hemiparesis. The patient has had some troubles with memory since her stroke that occurred in 2002. The patient is on chronic Coumadin therapy. She has a gait disorder, and she uses a walker for ambulation. The patient is on low-dose Aricept taking 5 mg in the morning. The patient indicates that if she takes the medication in the afternoon, she has a tendency to have hallucinations or vivid dreams at night. She was given a prescription for Namenda when she was here last, but she never went on the medication. The patient is on amitriptyline at 50 mg at night, and she has been on this chronically to help her sleep. She has had occasional falls, the last fall was 3 months ago, and the patient sustained an ankle fracture in September 2015. She returns to this office for further evaluation. The family members believe that there is some problems with the memory that is progressing. The patient lives with her husband, and her daughter lives next to her. She does not operate a motor vehicle, she has not driven in 2 years. The patient is not cooking, but she keeps up with the finances, appointments, and medications. She reports some occasional episodes of left facial numbness. This has been going on for 2 years.  Past Medical History  Diagnosis Date  . Coronary artery disease     a. s/p CABG;  b. 04/2010 Neg MV  . Hypertension   . Hyperlipidemia   . Hypothyroidism   . Depression   . Insomnia   . Fibromyalgia   . Gout   . Osteoarthritis   . Osteoporosis   . Cervical spinal stenosis   . Nasal polyposis   . Pneumonia     1990  . COPD with emphysema     PFT 05/02/10>>FEV1 1.35(62%), FEV1% 66, DLCO 75%  . Chronic diastolic CHF (congestive heart failure)     a.  02/2013 Echo: EF 55-60%, mild AI, mod dil LA.  [name] Atrial fibrillation     a. chronic/rate controlled;  b. chronic coumadin.  . CVA (cerebral vascular accident)     left sided weakness  . GERD (gastroesophageal reflux disease)     pepcid 2-3 times per week  . Intermittent confusion   . History of melanoma     squamous cell, melanoma  . Memory change 01/23/2014    Past Surgical History  Procedure Laterality Date  . Abdominal hysterectomy    . Breast lumpectomy  1980s    Benign lesion - right  . Carpel tunnel      right  . Tonsillectomy    . Eye surgery      bilateral cataracts  . Coronary artery bypass graft  2000  . Radiology with anesthesia  12/24/2011    Procedure: RADIOLOGY WITH ANESTHESIA;  Surgeon: Medication Radiologist, MD;  Location: [place];  Service: Radiology;  Laterality: N/A;  Extra Cranial Vascular Stent    Family History  Problem Relation Age of Onset  . Stroke Mother   . CAD Father   . Diabetes type II Brother   . CAD Brother     Social history:  reports that she quit smoking about 36 years ago. Her smoking use included Cigarettes. She has a 37.5 pack-year smoking history. She has  never used smokeless tobacco. She reports that she does not drink alcohol or use illicit drugs.  Medications:  Current Outpatient Prescriptions on File Prior to Visit  Medication Sig Dispense Refill  . albuterol (PROVENTIL HFA;VENTOLIN HFA) 108 (90 BASE) MCG/ACT inhaler Inhale 2 puffs into the lungs every 6 (six) hours as needed for wheezing or shortness of breath. 3 Inhaler 11  . alendronate (FOSAMAX) 70 MG tablet Take 70 mg by mouth every 7 (seven) days. Take with a full glass of water on an empty stomach. Sundays    . allopurinol (ZYLOPRIM) 300 MG tablet TAKE 1 TABLET EVERY DAY 90 tablet 2  . amLODipine (NORVASC) 5 MG tablet Take 1 tablet (5 mg total) by mouth every evening. 90 tablet 6  . celecoxib (CELEBREX) 200 MG capsule Take 200 mg by mouth 2 (two) times daily.      .  citalopram (CELEXA) 20 MG tablet Take 1 tablet (20 mg total) by mouth 2 (two) times daily. (Patient taking differently: Take 20 mg by mouth daily. ) 60 tablet 6  . clopidogrel (PLAVIX) 75 MG tablet TAKE 1 TABLET BY MOUTH EVERY DAY 90 tablet 3  . digoxin (LANOXIN) 0.125 MG tablet TAKE 1 TABLET EVERY DAY 90 tablet 2  . famotidine (PEPCID) 20 MG tablet Take 20 mg by mouth daily as needed for heartburn.     . furosemide (LASIX) 80 MG tablet Take 80 mg by mouth as directed. Take 80 mg  In morning 80 mg in afternoon and 40 mg in the evening.    [name] KLOR-CON M20 20 MEQ tablet TAKE 1 TABLET BY MOUTH TWICE A DAY 180 tablet 1  . levothyroxine (SYNTHROID, LEVOTHROID) 25 MCG tablet Take 25 mcg by mouth daily.    . metoprolol succinate (TOPROL-XL) 25 MG 24 hr tablet TAKE 1 TABLET (25 MG TOTAL) BY MOUTH ONCE. 90 tablet 2  . nitroGLYCERIN (NITROSTAT) 0.4 MG SL tablet Place 1 tablet (0.4 mg total) under the tongue every 5 (five) minutes as needed for chest pain. 25 tablet 3  . PROAIR HFA 108 (90 BASE) MCG/ACT inhaler INHALE 2 PUFFS INTO LUNGS EVERY 6 HOURS AS NEEDED FOR WHEEZING 8.5 each 0  . valsartan (DIOVAN) 40 MG tablet Take 1 tablet (40 mg total) by mouth 2 (two) times daily. 180 tablet 3  . Vitamin D, Ergocalciferol, (DRISDOL) 50000 UNITS CAPS capsule     . warfarin (COUMADIN) 5 MG tablet Take 1 tablet (5 mg total) by mouth as directed. (Patient taking differently: Take 5 mg by mouth as directed. 7.5mg  five days and tues and Saturday 5mg .) 120 tablet 1   No current facility-administered medications on file prior to visit.      Allergies  Allergen Reactions  . Ceclor [Cefaclor] Other (See Comments)    Reaction unknown  . Elastic Bandages & [Zinc] Other (See Comments)    Turns red on the areas it touches  . Ezetimibe     Myalgia  . Lipitor [Atorvastatin Calcium] Other (See Comments)    Increased fibromyalgia pain  . Mevacor [Lovastatin] Other (See Comments)    Increased fibromyalgia pain  . Pravachol  Other (See Comments)    Increased fibromyalgia pain  . Vasotec Other (See Comments)    Reaction unknown  . Welchol [Colesevelam Hcl]     Muscle aches  . Adhesive [Tape] Itching and Rash    Burning   . Latex Itching and Rash    Burning     ROS:  Out of a  complete 14 system review of symptoms, the patient complains only of the following symptoms, and all other reviewed systems are negative.  Memory disturbance Gait disturbance  Blood pressure 149/72, pulse 76, height 5' 4.5" (1.638 m), weight 166 lb (75.297 kg).  Physical Exam  General: The patient is alert and cooperative at the time of the examination.  Skin: No significant peripheral edema is noted. The patient has ankle brace on the left.   Neurologic Exam  Mental status: The Mini-Mental Status Examination done today shows a total score of 27/30.  Cranial nerves: Facial symmetry is present. Speech is normal, no aphasia or dysarthria is noted. Extraocular movements are full. Visual fields are full.  Motor: The patient has good strength in all 4 extremities, with except that there is some mild weakness with the left upper extremity..  Sensory examination: Soft touch sensation is symmetric on the face, arms, and legs.  Coordination: The patient has good finger-nose-finger and heel-to-shin bilaterally.  Gait and station: The patient has a wide-based, unsteady gait. The patient uses a walker for ambulation. Romberg is negative. No drift is seen.  Reflexes: Deep tendon reflexes are symmetric on the legs, some increase in the left biceps reflex was noted.     MRI brain 12/09/11:  IMPRESSION: No acute infarct.  Remote right paracentral pontine infarct.   Assessment/Plan:  1. Right pontine infarct, left hemiparesis  2. Gait disorder  3. Memory disorder  The patient has had a slow progression in her memory since her stroke in 2002. The patient is on Aricept, but she is also on anticholinergic medications such  as amitriptyline. The patient will go down on the dose from 50 to 25 mg at night of amitriptyline. The patient may require a medication for sleep other than the amitriptyline. The patient will be started on Namenda, working up to the maintenance dose. They are to contact me in 3 weeks if she is doing well on the Namenda, I will call in a prescription for the maintenance dose. She will follow-up in 6 months.  Jill Alexanders MD 01/23/2014 7:53 PM  [facility] [address] [place], [place] [address]  Phone [phone] Fax [phone]

## 2014-01-23 NOTE — Patient Instructions (Signed)
After 3 weeks, of the namenda therapy, call our office for [place] therapy.  Fall Prevention and Home Safety Falls cause injuries and can affect all age groups. It is possible to use preventive measures to significantly decrease the likelihood of falls. There are many simple measures which can make your home safer and prevent falls. OUTDOORS  Repair cracks and edges of walkways and driveways.  Remove high doorway thresholds.  Trim shrubbery on the main path into your home.  Have good outside lighting.  Clear walkways of tools, rocks, debris, and clutter.  Check that handrails are not broken and are securely fastened. Both sides of steps should have handrails.  Have leaves, snow, and ice cleared regularly.  Use sand or salt on walkways during winter months.  In the garage, clean up grease or oil spills. BATHROOM  Install night lights.  Install grab bars by the toilet and in the tub and shower.  Use non-skid mats or decals in the tub or shower.  Place a plastic non-slip stool in the shower to sit on, if needed.  Keep floors dry and clean up all water on the floor immediately.  Remove soap buildup in the tub or shower on a regular basis.  Secure bath mats with non-slip, double-sided rug tape.  Remove throw rugs and tripping hazards from the floors. BEDROOMS  Install night lights.  Make sure a bedside light is easy to reach.  Do not use oversized bedding.  Keep a telephone by your bedside.  Have a firm chair with side arms to use for getting dressed.  Remove throw rugs and tripping hazards from the floor. KITCHEN  Keep handles on pots and pans turned toward the center of the stove. Use back burners when possible.  Clean up spills quickly and allow time for drying.  Avoid walking on wet floors.  Avoid hot utensils and knives.  Position shelves so they are not too high or low.  Place commonly used objects within easy reach.  If necessary, use  a sturdy step stool with a grab bar when reaching.  Keep electrical cables out of the way.  Do not use floor polish or wax that makes floors slippery. If you must use wax, use non-skid floor wax.  Remove throw rugs and tripping hazards from the floor. STAIRWAYS  Never leave objects on stairs.  Place handrails on both sides of stairways and use them. Fix any loose handrails. Make sure handrails on both sides of the stairways are as long as the stairs.  Check carpeting to make sure it is firmly attached along stairs. Make repairs to worn or loose carpet promptly.  Avoid placing throw rugs at the top or bottom of stairways, or properly secure the rug with carpet tape to prevent slippage. Get rid of throw rugs, if possible.  Have an electrician put in a light switch at the top and bottom of the stairs. OTHER FALL PREVENTION TIPS  Wear low-heel or rubber-soled shoes that are supportive and fit well. Wear closed toe shoes.  When using a stepladder, make sure it is fully opened and both spreaders are firmly locked. Do not climb a closed stepladder.  Add color or contrast paint or tape to grab bars and handrails in your home. Place contrasting color strips on first and last steps.  Learn and use mobility aids as needed. Install an electrical emergency response system.  Turn on lights to avoid dark areas. Replace light bulbs that burn out immediately. Get light switches  that glow.  Arrange furniture to create clear pathways. Keep furniture in the same place.  Firmly attach carpet with non-skid or double-sided tape.  Eliminate uneven floor surfaces.  Select a carpet pattern that does not visually hide the edge of steps.  Be aware of all pets. OTHER HOME SAFETY TIPS  Set the water temperature for 120 F (48.8 C).  Keep emergency numbers on or near the telephone.  Keep smoke detectors on every level of the home and near sleeping areas. Document Released: 01/17/2002 Document  Revised: 07/29/2011 Document Reviewed: 04/18/2011 [facility] Patient Information 2015 [place], [place]. This information is not intended to replace advice given to you by your health care provider. Make sure you discuss any questions you have with your health care provider.

## 2014-01-28 ENCOUNTER — Other Ambulatory Visit: Payer: Self-pay | Admitting: Cardiology

## 2014-01-28 DIAGNOSIS — F329 Major depressive disorder, single episode, unspecified: Secondary | ICD-10-CM

## 2014-01-28 DIAGNOSIS — F32A Depression, unspecified: Secondary | ICD-10-CM

## 2014-01-31 ENCOUNTER — Ambulatory Visit (INDEPENDENT_AMBULATORY_CARE_PROVIDER_SITE_OTHER): Payer: Medicare Other | Admitting: *Deleted

## 2014-01-31 DIAGNOSIS — I4891 Unspecified atrial fibrillation: Secondary | ICD-10-CM

## 2014-01-31 DIAGNOSIS — I6350 Cerebral infarction due to unspecified occlusion or stenosis of unspecified cerebral artery: Secondary | ICD-10-CM

## 2014-01-31 DIAGNOSIS — I639 Cerebral infarction, unspecified: Secondary | ICD-10-CM

## 2014-01-31 LAB — POCT INR: INR: 2

## 2014-02-10 ENCOUNTER — Other Ambulatory Visit: Payer: Self-pay | Admitting: Cardiology

## 2014-02-12 ENCOUNTER — Other Ambulatory Visit: Payer: Self-pay | Admitting: Cardiology

## 2014-02-13 ENCOUNTER — Other Ambulatory Visit: Payer: Self-pay | Admitting: Cardiology

## 2014-02-13 ENCOUNTER — Other Ambulatory Visit: Payer: Self-pay | Admitting: Neurology

## 2014-02-14 ENCOUNTER — Other Ambulatory Visit: Payer: Self-pay | Admitting: Cardiology

## 2014-02-15 MED ORDER — MEMANTINE HCL ER 28 MG PO CP24: 28.0000 mg | ORAL_CAPSULE | Freq: Every day | ORAL | Status: DC

## 2014-02-15 NOTE — Telephone Encounter (Signed)
Last OV note says: The patient will be started on Namenda, working up to the maintenance dose. They are to contact me in 3 weeks if she is doing well on the Namenda, I will call in a prescription for the maintenance dose.  I called and spoke with patient.  Says she is doing quite well on Namenda.  Indicates she feels her memory has improved.  She still has her moments, but can really tell a difference.  I explained she would be transitioned to the maintenance dose, advised of dose and directions.  She verbalized understanding and will be sure to call us back if anything further is needed.

## 2014-02-16 ENCOUNTER — Telehealth: Payer: Self-pay | Admitting: Neurology

## 2014-02-16 NOTE — Telephone Encounter (Signed)
Patient stated experiencing yesterday left sided face numbness, under nose and chin going towards right side of face.  Please call and advise.

## 2014-02-17 NOTE — Telephone Encounter (Signed)
I called the patient. She indicates that the left face and  Some of the right upper lip has been numb for the last 3 days. She gets numbness of the left face off and on at times, but it usually does not last this long. There are no other associated symptoms. She is to watch this for now, but if any other new symptoms arise, she is to go to the ER ASAP.

## 2014-02-18 ENCOUNTER — Emergency Department (HOSPITAL_COMMUNITY)
Admission: EM | Admit: 2014-02-18 | Discharge: 2014-02-18 | Disposition: A | Discharge status: Home / Self Care | Payer: Medicare Other | Attending: Emergency Medicine | Admitting: Emergency Medicine

## 2014-02-18 ENCOUNTER — Encounter (HOSPITAL_COMMUNITY): Payer: Self-pay | Admitting: *Deleted

## 2014-02-18 ENCOUNTER — Emergency Department (HOSPITAL_COMMUNITY): Payer: Medicare Other

## 2014-02-18 DIAGNOSIS — Z85820 Personal history of malignant melanoma of skin: Secondary | ICD-10-CM | POA: Diagnosis not present

## 2014-02-18 DIAGNOSIS — I2510 Atherosclerotic heart disease of native coronary artery without angina pectoris: Secondary | ICD-10-CM | POA: Insufficient documentation

## 2014-02-18 DIAGNOSIS — Z87891 Personal history of nicotine dependence: Secondary | ICD-10-CM | POA: Insufficient documentation

## 2014-02-18 DIAGNOSIS — I4891 Unspecified atrial fibrillation: Secondary | ICD-10-CM | POA: Diagnosis not present

## 2014-02-18 DIAGNOSIS — Z951 Presence of aortocoronary bypass graft: Secondary | ICD-10-CM | POA: Diagnosis not present

## 2014-02-18 DIAGNOSIS — J449 Chronic obstructive pulmonary disease, unspecified: Secondary | ICD-10-CM | POA: Insufficient documentation

## 2014-02-18 DIAGNOSIS — Z791 Long term (current) use of non-steroidal anti-inflammatories (NSAID): Secondary | ICD-10-CM | POA: Insufficient documentation

## 2014-02-18 DIAGNOSIS — Z91040 Latex allergy status: Secondary | ICD-10-CM | POA: Insufficient documentation

## 2014-02-18 DIAGNOSIS — I10 Essential (primary) hypertension: Secondary | ICD-10-CM | POA: Insufficient documentation

## 2014-02-18 DIAGNOSIS — Z79899 Other long term (current) drug therapy: Secondary | ICD-10-CM | POA: Insufficient documentation

## 2014-02-18 DIAGNOSIS — M797 Fibromyalgia: Secondary | ICD-10-CM | POA: Diagnosis not present

## 2014-02-18 DIAGNOSIS — M810 Age-related osteoporosis without current pathological fracture: Secondary | ICD-10-CM | POA: Diagnosis not present

## 2014-02-18 DIAGNOSIS — Z8673 Personal history of transient ischemic attack (TIA), and cerebral infarction without residual deficits: Secondary | ICD-10-CM | POA: Insufficient documentation

## 2014-02-18 DIAGNOSIS — Z8719 Personal history of other diseases of the digestive system: Secondary | ICD-10-CM | POA: Diagnosis not present

## 2014-02-18 DIAGNOSIS — G4700 Insomnia, unspecified: Secondary | ICD-10-CM | POA: Insufficient documentation

## 2014-02-18 DIAGNOSIS — Z8701 Personal history of pneumonia (recurrent): Secondary | ICD-10-CM | POA: Insufficient documentation

## 2014-02-18 DIAGNOSIS — Z7901 Long term (current) use of anticoagulants: Secondary | ICD-10-CM | POA: Diagnosis not present

## 2014-02-18 DIAGNOSIS — M1990 Unspecified osteoarthritis, unspecified site: Secondary | ICD-10-CM | POA: Diagnosis not present

## 2014-02-18 DIAGNOSIS — E039 Hypothyroidism, unspecified: Secondary | ICD-10-CM | POA: Insufficient documentation

## 2014-02-18 DIAGNOSIS — F329 Major depressive disorder, single episode, unspecified: Secondary | ICD-10-CM | POA: Diagnosis not present

## 2014-02-18 DIAGNOSIS — R200 Anesthesia of skin: Secondary | ICD-10-CM | POA: Diagnosis not present

## 2014-02-18 DIAGNOSIS — I5032 Chronic diastolic (congestive) heart failure: Secondary | ICD-10-CM | POA: Insufficient documentation

## 2014-02-18 DIAGNOSIS — Z7902 Long term (current) use of antithrombotics/antiplatelets: Secondary | ICD-10-CM | POA: Diagnosis not present

## 2014-02-18 DIAGNOSIS — M109 Gout, unspecified: Secondary | ICD-10-CM | POA: Insufficient documentation

## 2014-02-18 LAB — COMPREHENSIVE METABOLIC PANEL
ALBUMIN: 4 g/dL (ref 3.5–5.2)
ALT: 27 U/L (ref 0–35)
ANION GAP: 8 (ref 5–15)
AST: 44 U/L — ABNORMAL HIGH (ref 0–37)
Alkaline Phosphatase: 47 U/L (ref 39–117)
BUN: 9 mg/dL (ref 6–23)
CO2: 28 mmol/L (ref 19–32)
CREATININE: 0.72 mg/dL (ref 0.50–1.10)
Calcium: 9.4 mg/dL (ref 8.4–10.5)
Chloride: 101 mEq/L (ref 96–112)
GFR, EST NON AFRICAN AMERICAN: 81 mL/min — AB (ref >90)
GLUCOSE: 148 mg/dL — AB (ref 70–99)
Potassium: 3.3 mmol/L — ABNORMAL LOW (ref 3.5–5.1)
Sodium: 137 mmol/L (ref 135–145)
Total Bilirubin: 0.7 mg/dL (ref 0.3–1.2)
Total Protein: 6.5 g/dL (ref 6.0–8.3)

## 2014-02-18 LAB — DIFFERENTIAL
Basophils Absolute: 0 10*3/uL (ref 0.0–0.1)
Basophils Relative: 1 % (ref 0–1)
Eosinophils Absolute: 0.1 10*3/uL (ref 0.0–0.7)
Eosinophils Relative: 2 % (ref 0–5)
LYMPHS ABS: 1.9 10*3/uL (ref 0.7–4.0)
Lymphocytes Relative: 30 % (ref 12–46)
MONOS PCT: 10 % (ref 3–12)
Monocytes Absolute: 0.6 10*3/uL (ref 0.1–1.0)
Neutro Abs: 3.8 10*3/uL (ref 1.7–7.7)
Neutrophils Relative %: 57 % (ref 43–77)

## 2014-02-18 LAB — CBC
HCT: 42.8 % (ref 36.0–46.0)
HEMOGLOBIN: 14.1 g/dL (ref 12.0–15.0)
MCH: 29.8 pg (ref 26.0–34.0)
MCHC: 32.9 g/dL (ref 30.0–36.0)
MCV: 90.5 fL (ref 78.0–100.0)
PLATELETS: 249 10*3/uL (ref 150–400)
RBC: 4.73 MIL/uL (ref 3.87–5.11)
RDW: 13.8 % (ref 11.5–15.5)
WBC: 6.4 10*3/uL (ref 4.0–10.5)

## 2014-02-18 LAB — I-STAT TROPONIN, ED: Troponin i, poc: 0 ng/mL (ref 0.00–0.08)

## 2014-02-18 LAB — PROTIME-INR
INR: 2.03 — ABNORMAL HIGH (ref 0.00–1.49)
Prothrombin Time: 23.1 seconds — ABNORMAL HIGH (ref 11.6–15.2)

## 2014-02-18 LAB — DIGOXIN LEVEL: DIGOXIN LVL: 0.4 ng/mL — AB (ref 0.8–2.0)

## 2014-02-18 LAB — APTT: aPTT: 39 seconds — ABNORMAL HIGH (ref 24–37)

## 2014-02-18 NOTE — Discharge Instructions (Signed)

## 2014-02-18 NOTE — ED Provider Notes (Signed)
CSN: [id number]     Arrival date & time 02/18/14  1442 History   First MD Initiated Contact with Patient 02/18/14 1537     Chief Complaint  Patient presents with  . Numbness     (Consider location/radiation/quality/duration/timing/severity/associated sxs/prior Treatment) The history is provided by the patient.   patient with numbness on her left side. She has had a previous stroke that had left-sided weakness at the time. She is on chronic Coumadin for it along with Plavix. She states she has at times numbness in her left upper face. She states the last few days it has been going down to the lower face and somewhat to the right side. She states today it got worse and is now on her whole left side of the body. She may be having some more difficulty walking. She uses a walker somewhat at baseline. She had a fall around 3 weeks ago and fell backwards, striking her head. No loss of consciousness. She does have some known vertebral blockage on the right vertebral artery. No headache. No confusion. No difficulty with vision. No numbness or weakness. Patient states she has no deficits from the previous stroke, however she does have a brace for her left foot that she wears..  Past Medical History  Diagnosis Date  . Coronary artery disease     a. s/p CABG;  b. 04/2010 Neg MV  . Hypertension   . Hyperlipidemia   . Hypothyroidism   . Depression   . Insomnia   . Fibromyalgia   . Gout   . Osteoarthritis   . Osteoporosis   . Cervical spinal stenosis   . Nasal polyposis   . Pneumonia     1990  . COPD with emphysema     PFT 05/02/10>>FEV1 1.35(62%), FEV1% 66, DLCO 75%  . Chronic diastolic CHF (congestive heart failure)     a. 02/2013 Echo: EF 55-60%, mild AI, mod dil LA.  [name] Atrial fibrillation     a. chronic/rate controlled;  b. chronic coumadin.  . CVA (cerebral vascular accident)     left sided weakness  . GERD (gastroesophageal reflux disease)     pepcid 2-3 times per week  . Intermittent  confusion   . History of melanoma     squamous cell, melanoma  . Memory change 01/23/2014   Past Surgical History  Procedure Laterality Date  . Abdominal hysterectomy    . Breast lumpectomy  1980s    Benign lesion - right  . Carpel tunnel      right  . Tonsillectomy    . Eye surgery      bilateral cataracts  . Coronary artery bypass graft  2000  . Radiology with anesthesia  12/24/2011    Procedure: RADIOLOGY WITH ANESTHESIA;  Surgeon: Medication Radiologist, MD;  Location: [facility];  Service: Radiology;  Laterality: N/A;  Extra Cranial Vascular Stent   Family History  Problem Relation Age of Onset  . Stroke Mother   . CAD Father   . Diabetes type II Brother   . CAD Brother    History  Substance Use Topics  . Smoking status: Former Smoker -- 1.50 packs/day for 25 years    Types: Cigarettes    Quit date: 02/10/1977  . Smokeless tobacco: Never Used  . Alcohol Use: No   OB History    No data available     Review of Systems  Constitutional: Negative for activity change and appetite change.  Eyes: Negative for pain.  Respiratory: Negative  for chest tightness and shortness of breath.   Cardiovascular: Negative for chest pain and leg swelling.  Gastrointestinal: Negative for nausea, vomiting, abdominal pain and diarrhea.  Genitourinary: Negative for flank pain.  Musculoskeletal: Negative for back pain and neck stiffness.  Skin: Negative for rash.  Neurological: Positive for numbness and headaches. Negative for speech difficulty and weakness.  Psychiatric/Behavioral: Negative for behavioral problems.      Allergies  Lipitor; Mevacor; Pravachol; Ezetimibe; Welchol; Adhesive; Ceclor; Elastic bandages &; Latex; and Vasotec  Home Medications   Prior to Admission medications   Medication Sig Start Date End Date Taking? Authorizing Provider  alendronate (FOSAMAX) 70 MG tablet Take 70 mg by mouth every Sunday. Take with a full glass of water on an empty stomach.   Yes  Historical Provider, MD  allopurinol (ZYLOPRIM) 300 MG tablet TAKE 1 TABLET EVERY DAY Patient taking differently: TAKE 1 TABLET every other day 09/27/13  Yes Darlin Coco, MD  amitriptyline (ELAVIL) 25 MG tablet Take 1 tablet (25 mg total) by mouth at bedtime. 01/23/14  Yes Kathrynn Ducking, MD  amLODipine (NORVASC) 5 MG tablet Take 1 tablet (5 mg total) by mouth every evening. 05/06/13  Yes Darlin Coco, MD  celecoxib (CELEBREX) 200 MG capsule Take 200 mg by mouth daily.    Yes Historical Provider, MD  citalopram (CELEXA) 20 MG tablet TAKE 1 TABLET (20 MG TOTAL) BY MOUTH 2 (TWO) TIMES DAILY. 01/30/14  Yes Darlin Coco, MD  clopidogrel (PLAVIX) 75 MG tablet TAKE 1 TABLET BY MOUTH EVERY DAY 05/06/13  Yes Darlin Coco, MD  digoxin (LANOXIN) 0.125 MG tablet TAKE 1 TABLET EVERY DAY 09/27/13  Yes Darlin Coco, MD  donepezil (ARICEPT) 5 MG tablet Take 5 mg by mouth daily.   Yes Historical Provider, MD  furosemide (LASIX) 80 MG tablet Take 40-80 mg by mouth 3 (three) times daily. Takes 80mg  in am, 80mg  in afternoon, 40mg  at night   Yes Historical Provider, MD  levothyroxine (SYNTHROID, LEVOTHROID) 25 MCG tablet Take 25 mcg by mouth daily.   Yes Historical Provider, MD  Memantine HCl ER (NAMENDA XR) 28 MG CP24 Take 28 mg by mouth daily. 02/15/14  Yes Kathrynn Ducking, MD  metoprolol succinate (TOPROL-XL) 25 MG 24 hr tablet Take 25 mg by mouth daily.   Yes Historical Provider, MD  nitroGLYCERIN (NITROSTAT) 0.4 MG SL tablet Place 1 tablet (0.4 mg total) under the tongue every 5 (five) minutes as needed for chest pain. 09/13/12  Yes Darlin Coco, MD  potassium chloride SA (K-DUR,KLOR-CON) 20 MEQ tablet Take 20 mEq by mouth 2 (two) times daily.   Yes Historical Provider, MD  valsartan (DIOVAN) 40 MG tablet Take 1 tablet (40 mg total) by mouth 2 (two) times daily. 01/03/14  Yes Darlin Coco, MD  warfarin (COUMADIN) 5 MG tablet Take 1 tablet (5 mg total) by mouth as directed. Patient taking  differently: Take 5 mg by mouth daily at 6 PM. Takes 5mg  on tues and sat only  Takes 7.5mg  all other days 08/25/13  Yes Darlin Coco, MD  albuterol (PROVENTIL HFA;VENTOLIN HFA) 108 (90 BASE) MCG/ACT inhaler Inhale 2 puffs into the lungs every 6 (six) hours as needed for wheezing or shortness of breath. 02/19/13   Theodis Blaze, MD  furosemide (LASIX) 80 MG tablet TAKE 1 TABLET BY MOUTH TWICE A DAY 02/14/14   Darlin Coco, MD  KLOR-CON M20 20 MEQ tablet TAKE 1 TABLET BY MOUTH TWICE A DAY 02/13/14   Darlin Coco, MD  metoprolol succinate (TOPROL-XL) 25 MG 24 hr tablet TAKE 1 TABLET (25 MG TOTAL) BY MOUTH ONCE. 09/27/13   Darlin Coco, MD  PROAIR HFA 108 (90 BASE) MCG/ACT inhaler INHALE 2 PUFFS INTO LUNGS EVERY 6 HOURS AS NEEDED FOR WHEEZING 12/22/13   Darlin Coco, MD   BP 143/58 mmHg  Pulse 59  Temp(Src) 97.7 F (36.5 C)  Resp 18  SpO2 96% Physical Exam  Constitutional: She is oriented to person, place, and time. She appears well-developed and well-nourished.  HENT:  Head: Normocephalic and atraumatic.  Eyes: EOM are normal. Pupils are equal, round, and reactive to light.  Neck: Normal range of motion. Neck supple.  Cardiovascular: Normal rate, regular rhythm and normal heart sounds.   No murmur heard. Pulmonary/Chest: Effort normal and breath sounds normal. No respiratory distress. She has no wheezes. She has no rales.  Abdominal: Soft. Bowel sounds are normal. She exhibits no distension. There is no tenderness. There is no rebound and no guarding.  Musculoskeletal: Normal range of motion.  Neurological: She is alert and oriented to person, place, and time. No cranial nerve deficit.  Grossly decreased sensation to left side of face. Good strength. Eye movements intact. Pupils are reactive. Good shoulder shrug bilaterally. Good flexion and extension at shoulders elbows and wrists. Good grip strength. Sensation grossly intact over bilateral hands and feet. Sensation intact over  shoulders. Patient is able to stand with her eyes closed. Good straight leg raise strength bilaterally.  Skin: Skin is warm and dry.  Psychiatric: She has a normal mood and affect. Her speech is normal.  Nursing note and vitals reviewed.   ED Course  Procedures (including critical care time) Labs Review Labs Reviewed  PROTIME-INR - Abnormal; Notable for the following:    Prothrombin Time 23.1 (*)    INR 2.03 (*)    All other components within normal limits  APTT - Abnormal; Notable for the following:    aPTT 39 (*)    All other components within normal limits  COMPREHENSIVE METABOLIC PANEL - Abnormal; Notable for the following:    Potassium 3.3 (*)    Glucose, Bld 148 (*)    AST 44 (*)    GFR calc non Af Amer 81 (*)    All other components within normal limits  DIGOXIN LEVEL - Abnormal; Notable for the following:    Digoxin Level 0.4 (*)    All other components within normal limits  CBC  DIFFERENTIAL  Randolm Idol, ED    Imaging Review Mr Brain Wo Contrast  02/18/2014   CLINICAL DATA:  Three days of numbness in the face. Dizziness. Fall 3 weeks ago hitting the occipital area of the head. Residual left-sided numbness from prior stroke. On anticoagulation for atrial fibrillation.  EXAM: MRI HEAD WITHOUT CONTRAST  TECHNIQUE: Multiplanar, multiecho pulse sequences of the brain and surrounding structures were obtained without intravenous contrast.  COMPARISON:  12/09/2011  FINDINGS: There is no evidence of acute infarct, intracranial hemorrhage, mass, midline shift, or extra-axial fluid collection. There is moderate cerebral and cerebellar atrophy, similar to prior. Chronic right paramedian pontine infarct is again seen, as well as a tiny inferior right cerebellar chronic infarct. Minimal periventricular white matter T2 hyperintensity does not appear significantly changed and is nonspecific but may reflect minimal chronic small vessel ischemic disease, less than is often seen in  patients of this age.  Prior bilateral cataract extraction is noted. Left maxillary sinus mucous retention cyst is unchanged. Mastoid air cells are clear. Abnormal  right distal vertebral artery flow void is again noted. Other major intracranial vascular flow voids are preserved.  IMPRESSION: 1. No acute intracranial abnormality. 2. Chronic pontine and right cerebellar infarcts.   Electronically Signed   By: Logan Bores   On: 02/18/2014 18:35     EKG Interpretation   Date/Time:  Saturday February 18 2014 14:59:50 EST Ventricular Rate:  66 PR Interval:    QRS Duration: 80 QT Interval:  398 QTC Calculation: 417 R Axis:   79 Text Interpretation:  Atrial fibrillation ST \\T \ T wave abnormality,  consider inferior ischemia ST \\T \ T wave abnormality, consider  anterolateral ischemia Abnormal ECG Confirmed by Jeneen Rinks  MD, [place] ([id number])  on 02/18/2014 3:05:25 PM      MDM   Final diagnoses:  Numbness    Patient with subjective numbness to left side of body. Exam overall benign. MRI done to rule out stroke. No stroke. already on Plavix and Coumadin. Will discharge home to follow-up with her neurologist    Jasper Riling. Alvino Chapel, MD 02/18/14 2317

## 2014-02-18 NOTE — ED Notes (Signed)
Pt placed on monitor upon return to room from MRI. Pt continues to be monitored by blood pressure, pulse ox, and 5 lead. pts family remains at bedside.

## 2014-02-18 NOTE — ED Notes (Signed)
Pt reports hx of cva, has numbness to upper left side of face since then. Now reports increase in numbness and it was spread from her upper left side of face, down entire left side of body. Numbness began to get worse 2 days ago. Pt having difficulty ambulating, no facial droop noted at triage, left grip slightly weaker.

## 2014-02-18 NOTE — ED Notes (Signed)
Pt returned to exam room from MRI. Pt resting quietly at the time. Denies pain. Vital signs stable. Family at bedside. NAD.

## 2014-02-18 NOTE — Progress Notes (Signed)
Pt complained of slight claustrophobia prior to exam. Pt was offered lavender and she said she would like to try this. Pt received one drop of lavender on a cotton ball while in scanner that was taped to her rt shoulder. Pt exclaimed she used oils at home. Pt had no complaints at end of exam other than scanner noise. Tolerated exams and lavender well.

## 2014-02-18 NOTE — ED Notes (Signed)
Pt off unit with MRI 

## 2014-02-20 ENCOUNTER — Telehealth: Payer: Self-pay | Admitting: Neurology

## 2014-02-20 NOTE — Telephone Encounter (Signed)
I called patient.the patient went to the emergency room on 02/18/2014, had MRI evaluation of the brain. The MRI did not show any new stroke events. The patient is complaining some numbness of the left face, left foot, right lower face, and right foot. No change in balance, the patient believes that there is some clumsiness with the hands. The patient is on Coumadin that is therapeutic, and she is on Plavix. The patient will continue her medications, it is possible there may be a slight extension of the stroke in the brainstem that is not visible on MRI. The patient is on maximal medical therapy at this time.

## 2014-02-20 NOTE — Telephone Encounter (Signed)
Spoke to patient and she went to the ER this weekend because the numbness was getting work.  She relayed the numbness is in her left foot, right side of face, and side of right foot.  They did an MRI at the hospital but it didn't show any sign of stroke.  She would like some advice.

## 2014-02-20 NOTE — Telephone Encounter (Signed)
Pt called went to ER over the weekend and is still having numbness in face and right leg wants appt this week dg

## 2014-02-22 ENCOUNTER — Other Ambulatory Visit: Payer: Self-pay | Admitting: Radiology

## 2014-02-22 DIAGNOSIS — I6523 Occlusion and stenosis of bilateral carotid arteries: Secondary | ICD-10-CM

## 2014-03-01 ENCOUNTER — Ambulatory Visit (HOSPITAL_COMMUNITY)
Admission: RE | Admit: 2014-03-01 | Discharge: 2014-03-01 | Disposition: A | Discharge status: Home / Self Care | Payer: Medicare Other | Source: Ambulatory Visit | Attending: Family Medicine | Admitting: Family Medicine

## 2014-03-01 DIAGNOSIS — I6509 Occlusion and stenosis of unspecified vertebral artery: Secondary | ICD-10-CM | POA: Diagnosis not present

## 2014-03-01 DIAGNOSIS — I6523 Occlusion and stenosis of bilateral carotid arteries: Secondary | ICD-10-CM | POA: Diagnosis present

## 2014-03-01 NOTE — Progress Notes (Signed)
Bilateral carotid artery duplex completed:  1-39% ICA stenosis.  Vertebral artery flow is antegrade.     

## 2014-03-02 ENCOUNTER — Ambulatory Visit (INDEPENDENT_AMBULATORY_CARE_PROVIDER_SITE_OTHER): Payer: Medicare Other | Admitting: *Deleted

## 2014-03-02 DIAGNOSIS — I4891 Unspecified atrial fibrillation: Secondary | ICD-10-CM

## 2014-03-02 DIAGNOSIS — I6350 Cerebral infarction due to unspecified occlusion or stenosis of unspecified cerebral artery: Secondary | ICD-10-CM

## 2014-03-02 DIAGNOSIS — I639 Cerebral infarction, unspecified: Secondary | ICD-10-CM

## 2014-03-02 LAB — POCT INR: INR: 2.4

## 2014-03-07 ENCOUNTER — Other Ambulatory Visit (HOSPITAL_COMMUNITY): Payer: Self-pay | Admitting: Interventional Radiology

## 2014-03-07 DIAGNOSIS — R42 Dizziness and giddiness: Secondary | ICD-10-CM

## 2014-03-07 DIAGNOSIS — I771 Stricture of artery: Secondary | ICD-10-CM

## 2014-03-08 ENCOUNTER — Other Ambulatory Visit: Payer: Self-pay | Admitting: *Deleted

## 2014-03-08 MED ORDER — WARFARIN SODIUM 5 MG PO TABS: 5.0000 mg | ORAL_TABLET | ORAL | Status: DC

## 2014-03-14 ENCOUNTER — Ambulatory Visit (HOSPITAL_COMMUNITY)
Admission: RE | Admit: 2014-03-14 | Discharge: 2014-03-14 | Disposition: A | Discharge status: Home / Self Care | Payer: Medicare Other | Source: Ambulatory Visit | Attending: Interventional Radiology | Admitting: Interventional Radiology

## 2014-03-14 DIAGNOSIS — R42 Dizziness and giddiness: Secondary | ICD-10-CM

## 2014-03-14 DIAGNOSIS — I771 Stricture of artery: Secondary | ICD-10-CM

## 2014-03-17 ENCOUNTER — Other Ambulatory Visit: Payer: Self-pay | Admitting: Cardiology

## 2014-03-21 ENCOUNTER — Encounter: Payer: Self-pay | Admitting: Cardiology

## 2014-03-21 ENCOUNTER — Ambulatory Visit (INDEPENDENT_AMBULATORY_CARE_PROVIDER_SITE_OTHER): Payer: Medicare Other | Admitting: Cardiology

## 2014-03-21 VITALS — BP 138/82 | HR 62 | Ht 64.0 in | Wt 169.8 lb

## 2014-03-21 DIAGNOSIS — I482 Chronic atrial fibrillation, unspecified: Secondary | ICD-10-CM

## 2014-03-21 DIAGNOSIS — I119 Hypertensive heart disease without heart failure: Secondary | ICD-10-CM

## 2014-03-21 DIAGNOSIS — E785 Hyperlipidemia, unspecified: Secondary | ICD-10-CM

## 2014-03-21 DIAGNOSIS — I5032 Chronic diastolic (congestive) heart failure: Secondary | ICD-10-CM

## 2014-03-21 NOTE — Patient Instructions (Signed)
Will obtain labs today and call you with the results (bmet)  Your physician recommends that you continue on your current medications as directed. Please refer to the Current Medication list given to you today.  Your physician recommends that you schedule a follow-up appointment in: 4 months with fasting labs (lp/bmet/hfp)

## 2014-03-21 NOTE — Progress Notes (Signed)
Cardiology Office Note   Date:  03/21/2014   ID:  Amber [name], Amber [name] Aug 20, 1934, MRN [id number]  PCP:  Amber Kroner, MD  Cardiologist:   Amber Coco, MD   No chief complaint on file.     History of Present Illness: Amber [name] is a 80 y.o. female who presents for follow-up office visit.  This pleasant 80 year old woman is seen for a scheduled followup office visit. She was hospitalized 02/12/13 until 02/19/13 for acute pneumococcal pneumonia. She has made a good recovery from the pneumonia . She has a complex past medical history. She has known ischemic heart disease with coronary artery bypass graft surgery in 2000. She has had remote stroke with residual left hemiparesis. She's had a long history of high blood pressure and high cholesterol. She has established atrial fibrillation and is on Coumadin. She has chronically elevated CK levels and she has a history of fibromyalgia. She is followed for this by Amber [name], rheumatologist with Amber [name]. She does not tolerate statins. She's had congestive heart failure secondary to diastolic dysfunction. She was cardioverted in June 2012 but remained in sinus rhythm for less than one week. She is now in established chronic atrial fibrillation on Coumadin.  The patient has a history of COPD. She recently saw Amber [name] who felt that most of her dyspnea was probably related to worsening congestive heart failure rather than her lung disease. A chest x-ray on 08/06/12 showed x-ray findings of mild CHF. The patient has improved since increasing her furosemide to a total daily dose of 160 mg. previously we had switched her from Diovan to losartan because of cost . However, she did not do as well and we switched her back to Diovan and she feels better . The patient still has very little stamina when she walks.  The patient had an echocardiogram on 08/12/12 showing an ejection fraction of 60-65%. She also had a Lexa scan Myoview on  08/25/12 which showed no evidence of ischemia and she does have an old inferior wall myocardial infarction scar. She had a repeat Lexi scan Myoview on 03/23/13 which showed findings similar to July 2014. There is inferior wall scar with surrounding peri-infarct ischemia.  Since last visit the patient has had no new cardiac symptoms. She has persistently high cholesterol levels and is intolerant of all statin drugs as well as ezetimibe. She was considered for one of the drug studies with the PCS K9 injections but was not qualified for it because of her high CK levels. she has a history of an old stroke. She has an Area of intracranial stenosis followed by Amber [name]. Her previous neurologist was Amber [name].  Amber [name] is her new neurologist.  He placed her on Namenda for her memory problem and she notes a distinct improvement since Namenda was added to her regimen. The patient denies any chest discomfort.  She is not having any increased shortness of breath.  She is not having any increased peripheral edema.  She has reduced her Celebrex to just once a day.  She has a new brace on her left foot which is working better. Her current diuretic regimen is to take 80 mg of Lasix in the morning and 80 mg at supper and an additional 40 mg at bedtime.  Past Medical History  Diagnosis Date  . Coronary artery disease     a. s/p CABG;  b. 04/2010 Neg MV  . Hypertension   . Hyperlipidemia   .  Hypothyroidism   . Depression   . Insomnia   . Fibromyalgia   . Gout   . Osteoarthritis   . Osteoporosis   . Cervical spinal stenosis   . Nasal polyposis   . Pneumonia     1990  . COPD with emphysema     PFT 05/02/10>>FEV1 1.35(62%), FEV1% 66, DLCO 75%  . Chronic diastolic CHF (congestive heart failure)     a. 02/2013 Echo: EF 55-60%, mild AI, mod dil LA.  [name] Atrial fibrillation     a. chronic/rate controlled;  b. chronic coumadin.  . CVA (cerebral vascular accident)     left sided weakness  . GERD  (gastroesophageal reflux disease)     pepcid 2-3 times per week  . Intermittent confusion   . History of melanoma     squamous cell, melanoma  . Memory change 01/23/2014    Past Surgical History  Procedure Laterality Date  . Abdominal hysterectomy    . Breast lumpectomy  1980s    Benign lesion - right  . Carpel tunnel      right  . Tonsillectomy    . Eye surgery      bilateral cataracts  . Coronary artery bypass graft  2000  . Radiology with anesthesia  12/24/2011    Procedure: RADIOLOGY WITH ANESTHESIA;  Surgeon: Medication Radiologist, MD;  Location: [facility];  Service: Radiology;  Laterality: N/A;  Extra Cranial Vascular Stent     Current Outpatient Prescriptions  Medication Sig Dispense Refill  . alendronate (FOSAMAX) 70 MG tablet Take 70 mg by mouth every Sunday. Take with a full glass of water on an empty stomach.    [name] allopurinol (ZYLOPRIM) 300 MG tablet TAKE 1 TABLET EVERY DAY (Patient taking differently: TAKE 1 TABLET every other day) 90 tablet 2  . amitriptyline (ELAVIL) 25 MG tablet Take 1 tablet (25 mg total) by mouth at bedtime. 30 tablet 3  . amLODipine (NORVASC) 5 MG tablet Take 1 tablet (5 mg total) by mouth every evening. 90 tablet 6  . celecoxib (CELEBREX) 200 MG capsule Take 200 mg by mouth daily.     . citalopram (CELEXA) 20 MG tablet TAKE 1 TABLET (20 MG TOTAL) BY MOUTH 2 (TWO) TIMES DAILY. 60 tablet 6  . clopidogrel (PLAVIX) 75 MG tablet TAKE 1 TABLET BY MOUTH EVERY DAY 90 tablet 3  . COUMADIN 5 MG tablet TAKE 1 TABLET (5 MG TOTAL) BY MOUTH AS DIRECTED. 120 tablet 1  . digoxin (LANOXIN) 0.125 MG tablet TAKE 1 TABLET EVERY DAY 90 tablet 2  . donepezil (ARICEPT) 5 MG tablet Take 5 mg by mouth daily.    [name] KLOR-CON M20 20 MEQ tablet TAKE 1 TABLET BY MOUTH TWICE A DAY 180 tablet 0  . levothyroxine (SYNTHROID, LEVOTHROID) 25 MCG tablet Take 25 mcg by mouth daily.    . Memantine HCl ER (NAMENDA XR) 28 MG CP24 Take 28 mg by mouth daily. 30 capsule 3  . metoprolol  succinate (TOPROL-XL) 25 MG 24 hr tablet Take 25 mg by mouth daily.    . nitroGLYCERIN (NITROSTAT) 0.4 MG SL tablet Place 1 tablet (0.4 mg total) under the tongue every 5 (five) minutes as needed for chest pain. 25 tablet 3  . potassium chloride SA (K-DUR,KLOR-CON) 20 MEQ tablet Take 20 mEq by mouth 2 (two) times daily.    [name] PROAIR HFA 108 (90 BASE) MCG/ACT inhaler INHALE 2 PUFFS INTO LUNGS EVERY 6 HOURS AS NEEDED FOR WHEEZING 8.5 each 0  .  valsartan (DIOVAN) 40 MG tablet Take 1 tablet (40 mg total) by mouth 2 (two) times daily. 180 tablet 3  . warfarin (COUMADIN) 5 MG tablet Take 1 tablet (5 mg total) by mouth as directed. Take as directed by the [facility] Clinic (Patient taking differently: Take 5 mg by mouth as directed. Take 5 mg on Tuesday and Saturday and 7.5 mg on all other days.) 120 tablet 1   No current facility-administered medications for this visit.    Allergies:   Lipitor; Mevacor; Pravachol; Ezetimibe; Welchol; Adhesive; Ceclor; Elastic bandages &; Latex; and Vasotec    Social History:  The patient  reports that she quit smoking about 37 years ago. Her smoking use included Cigarettes. She has a 37.5 pack-year smoking history. She has never used smokeless tobacco. She reports that she does not drink alcohol or use illicit drugs.   Family History:  The patient's family history includes CAD in her brother and father; Diabetes type II in her brother; Stroke in her mother.    ROS:  Please see the history of present illness.   Otherwise, review of systems are positive for none.   All other systems are reviewed and negative.    PHYSICAL EXAM: VS:  BP 138/82 mmHg  Pulse 62  Ht 5\' 4"  (1.626 m)  Wt 169 lb 12.8 oz (77.021 kg)  BMI 29.13 kg/m2 , BMI Body mass index is 29.13 kg/(m^2). GEN: Well nourished, well developed, in no acute distress HEENT: normal Neck: no JVD, carotid bruits, or masses Cardiac: Irregularly irregular.; no murmurs, rubs, or gallops,no edema  Respiratory:  clear  to auscultation bilaterally, normal work of breathing GI: soft, nontender, nondistended, + BS MS: no deformity or atrophy Skin: warm and dry, no rash Neuro:  Strength and sensation are intact Psych: euthymic mood, full affect   EKG:  EKG is ordered today. The ekg ordered today demonstrates atrial fibrillation with a rate of 62 bpm.  ST and T wave abnormalities are slightly improved since 02/18/14.   Recent Labs: 04/01/2013: TSH 0.56 02/18/2014: ALT 27; BUN 9; Creatinine 0.72; Hemoglobin 14.1; Platelets 249; Potassium 3.3*; Sodium 137    Lipid Panel    Component Value Date/Time   CHOL 353* 01/03/2014 1550   TRIG 399.0* 01/03/2014 1550   HDL 38.10* 01/03/2014 1550   CHOLHDL 9 01/03/2014 1550   VLDL 79.8* 01/03/2014 1550   LDLCALC * 04/28/2010 0110    161        Total Cholesterol/HDL:CHD Risk Coronary Heart Disease Risk Table                     Men   Women  1/2 Average Risk   3.4   3.3  Average Risk       5.0   4.4  2 X Average Risk   9.6   7.1  3 X Average Risk  23.4   11.0        Use the calculated Patient Ratio above and the CHD Risk Table to determine the patient's CHD Risk.        ATP III CLASSIFICATION (LDL):  <100     mg/dL   Optimal  100-129  mg/dL   Near or Above                    Optimal  130-159  mg/dL   Borderline  160-189  mg/dL   High  >190     mg/dL   Very High  LDLDIRECT 204.7 01/03/2014 1550      Wt Readings from Last 3 Encounters:  03/21/14 169 lb 12.8 oz (77.021 kg)  01/23/14 166 lb (75.297 kg)  01/03/14 170 lb (77.111 kg)         ASSESSMENT AND PLAN:  1. ischemic heart disease with CABG in 2000 2. history of CVA in 2001 3. permanent atrial fibrillation on Coumadin 4. dyslipidemia with intolerance to statins and other cholesterol lowering medicine 5. history of fibromyalgia and chronically elevated CK levels 6. Hypothyroidism 7. chronic diastolic heart failure with ejection fraction of 60-65% 8. COPD 9. mild memory impairment,  improved since the addition of Namenda   Current medicines are reviewed at length with the patient today.  The patient does not have concerns regarding medicines.  The following changes have been made:  no change  Labs/ tests ordered today include:   Orders Placed This Encounter  Procedures  . Basic metabolic panel  . Lipid panel  . Basic metabolic panel  . Hepatic function panel  . EKG 12-Lead     Disposition:   FU with Dr. Mare Ferrari in 4 months for office visit, lipid panel, hepatic function panel, and basal metabolic panel   Signed, Amber Coco, MD  03/21/2014 5:52 PM    [facility] [address], [place], [place]  [address] Phone: [phone]; Fax: [phone]

## 2014-03-22 ENCOUNTER — Telehealth: Payer: Self-pay | Admitting: *Deleted

## 2014-03-22 DIAGNOSIS — E876 Hypokalemia: Secondary | ICD-10-CM

## 2014-03-22 NOTE — Telephone Encounter (Signed)
Patient was seen for office visit yesterday and needed to go to lab before leaving. Patient did not go, Left message to call back c

## 2014-03-22 NOTE — Telephone Encounter (Signed)
Spoke with patient and she apologized for forgetting to go to the lab, realized after she had left appointment  Patient will come tomorrow for bmet

## 2014-03-23 ENCOUNTER — Other Ambulatory Visit: Payer: Medicare Other

## 2014-03-24 ENCOUNTER — Other Ambulatory Visit: Payer: Self-pay | Admitting: Cardiology

## 2014-03-24 ENCOUNTER — Other Ambulatory Visit (INDEPENDENT_AMBULATORY_CARE_PROVIDER_SITE_OTHER): Payer: Medicare Other | Admitting: *Deleted

## 2014-03-24 DIAGNOSIS — E876 Hypokalemia: Secondary | ICD-10-CM

## 2014-03-24 LAB — BASIC METABOLIC PANEL
BUN: 15 mg/dL (ref 6–23)
CHLORIDE: 102 meq/L (ref 96–112)
CO2: 35 mEq/L — ABNORMAL HIGH (ref 19–32)
Calcium: 9 mg/dL (ref 8.4–10.5)
Creatinine, Ser: 0.94 mg/dL (ref 0.40–1.20)
GFR: 61.3 mL/min (ref >60.00)
Glucose, Bld: 79 mg/dL (ref 70–99)
Potassium: 3.4 mEq/L — ABNORMAL LOW (ref 3.5–5.1)
SODIUM: 141 meq/L (ref 135–145)

## 2014-03-27 ENCOUNTER — Telehealth: Payer: Self-pay | Admitting: *Deleted

## 2014-03-27 MED ORDER — POTASSIUM CHLORIDE CRYS ER 20 MEQ PO TBCR: 20.0000 meq | EXTENDED_RELEASE_TABLET | Freq: Three times a day (TID) | ORAL | Status: DC

## 2014-03-27 NOTE — Telephone Encounter (Signed)
Advised patient of lab results and increase in [place]

## 2014-04-11 ENCOUNTER — Other Ambulatory Visit: Payer: Self-pay | Admitting: Cardiology

## 2014-04-13 ENCOUNTER — Ambulatory Visit (INDEPENDENT_AMBULATORY_CARE_PROVIDER_SITE_OTHER): Payer: Medicare Other | Admitting: *Deleted

## 2014-04-13 DIAGNOSIS — I4891 Unspecified atrial fibrillation: Secondary | ICD-10-CM

## 2014-04-13 DIAGNOSIS — I639 Cerebral infarction, unspecified: Secondary | ICD-10-CM

## 2014-04-13 DIAGNOSIS — I6350 Cerebral infarction due to unspecified occlusion or stenosis of unspecified cerebral artery: Secondary | ICD-10-CM

## 2014-04-13 LAB — POCT INR: INR: 2.6

## 2014-04-13 NOTE — Telephone Encounter (Signed)
Spoke with patient and she states she has been taking Lasix 80 mg morning and lunch and 40 mg in the evening  Per  Dr. Mare Ferrari patient should continue this

## 2014-04-26 ENCOUNTER — Other Ambulatory Visit (HOSPITAL_COMMUNITY): Payer: Self-pay | Admitting: Interventional Radiology

## 2014-04-26 DIAGNOSIS — R42 Dizziness and giddiness: Secondary | ICD-10-CM

## 2014-04-26 DIAGNOSIS — I771 Stricture of artery: Secondary | ICD-10-CM

## 2014-05-03 ENCOUNTER — Ambulatory Visit (HOSPITAL_COMMUNITY)
Admission: RE | Admit: 2014-05-03 | Discharge: 2014-05-03 | Disposition: A | Discharge status: Home / Self Care | Payer: Medicare Other | Source: Ambulatory Visit | Attending: Interventional Radiology | Admitting: Interventional Radiology

## 2014-05-03 ENCOUNTER — Other Ambulatory Visit (HOSPITAL_COMMUNITY): Payer: Self-pay | Admitting: Interventional Radiology

## 2014-05-03 ENCOUNTER — Ambulatory Visit (HOSPITAL_COMMUNITY): Payer: Medicare Other

## 2014-05-03 ENCOUNTER — Encounter (HOSPITAL_COMMUNITY): Payer: Self-pay

## 2014-05-03 DIAGNOSIS — R42 Dizziness and giddiness: Secondary | ICD-10-CM

## 2014-05-03 DIAGNOSIS — I6622 Occlusion and stenosis of left posterior cerebral artery: Secondary | ICD-10-CM | POA: Diagnosis not present

## 2014-05-03 DIAGNOSIS — I771 Stricture of artery: Secondary | ICD-10-CM | POA: Diagnosis present

## 2014-05-03 DIAGNOSIS — I6503 Occlusion and stenosis of bilateral vertebral arteries: Secondary | ICD-10-CM | POA: Insufficient documentation

## 2014-05-03 DIAGNOSIS — I6523 Occlusion and stenosis of bilateral carotid arteries: Secondary | ICD-10-CM | POA: Insufficient documentation

## 2014-05-03 MED ORDER — IOHEXOL 350 MG/ML SOLN
80.0000 mL | Freq: Once | INTRAVENOUS | Status: AC | PRN
  Administered 2014-05-03: 80 mL via INTRAVENOUS

## 2014-05-08 ENCOUNTER — Other Ambulatory Visit: Payer: Self-pay | Admitting: Neurology

## 2014-05-17 ENCOUNTER — Telehealth (HOSPITAL_COMMUNITY): Payer: Self-pay | Admitting: Interventional Radiology

## 2014-05-17 NOTE — Telephone Encounter (Signed)
Called pt, spoke to her husband. He said pt could not come to the phone right now so I asked him to have her call me back. JM

## 2014-06-02 ENCOUNTER — Other Ambulatory Visit: Payer: Self-pay | Admitting: Cardiology

## 2014-06-08 ENCOUNTER — Other Ambulatory Visit: Payer: Self-pay | Admitting: Cardiology

## 2014-06-13 ENCOUNTER — Other Ambulatory Visit: Payer: Self-pay | Admitting: Neurology

## 2014-06-23 ENCOUNTER — Ambulatory Visit (INDEPENDENT_AMBULATORY_CARE_PROVIDER_SITE_OTHER): Payer: Medicare Other | Admitting: *Deleted

## 2014-06-23 DIAGNOSIS — I482 Chronic atrial fibrillation, unspecified: Secondary | ICD-10-CM

## 2014-06-23 DIAGNOSIS — I639 Cerebral infarction, unspecified: Secondary | ICD-10-CM

## 2014-06-23 DIAGNOSIS — I4891 Unspecified atrial fibrillation: Secondary | ICD-10-CM | POA: Diagnosis not present

## 2014-06-23 DIAGNOSIS — I6350 Cerebral infarction due to unspecified occlusion or stenosis of unspecified cerebral artery: Secondary | ICD-10-CM

## 2014-06-23 LAB — POCT INR: INR: 2.5

## 2014-06-27 ENCOUNTER — Other Ambulatory Visit (HOSPITAL_COMMUNITY): Payer: Self-pay | Admitting: Internal Medicine

## 2014-06-27 ENCOUNTER — Other Ambulatory Visit (HOSPITAL_COMMUNITY): Payer: Self-pay | Admitting: Interventional Radiology

## 2014-06-27 ENCOUNTER — Other Ambulatory Visit: Payer: Self-pay | Admitting: Cardiology

## 2014-06-27 ENCOUNTER — Other Ambulatory Visit: Payer: Self-pay | Admitting: Adult Health

## 2014-06-27 ENCOUNTER — Telehealth: Payer: Self-pay | Admitting: Pharmacist Clinician (PhC)/ Clinical Pharmacy Specialist

## 2014-06-27 DIAGNOSIS — R200 Anesthesia of skin: Secondary | ICD-10-CM

## 2014-06-27 DIAGNOSIS — R202 Paresthesia of skin: Principal | ICD-10-CM

## 2014-06-27 DIAGNOSIS — G4440 Drug-induced headache, not elsewhere classified, not intractable: Secondary | ICD-10-CM

## 2014-06-27 MED ORDER — ENOXAPARIN SODIUM 80 MG/0.8ML ~~LOC~~ SOLN: 80.0000 mg | Freq: Two times a day (BID) | SUBCUTANEOUS | Status: DC

## 2014-06-27 NOTE — Telephone Encounter (Signed)
Follow Up       Pt's daughter returning Kristin's phone call.

## 2014-06-27 NOTE — Telephone Encounter (Signed)
Dtr called today, stating patient to have cerebral arteriogram this Friday and needs a lovenox bridge.  Dtr is an Therapist, sports and has done lovenox injections for her mother in the past.  Pt has history of CVA, with a CHADS2 score of 4, including 2 for a previous CVA.  Reviewed below schedule with daughter   Enoxparin dose: 80 mg  Date  Warfarin Dose (evenings) Enoxaprin Dose   6     5    5/16 4 7.5 mg   5/17 3 0   5/18 2 0 8 am     8 pm  5/19 1 0 8 am  5/20 Procedure 5 mg    5/21 1 10  mg 8 am     8 pm  5/22 2 10  mg  8 am     8 pm  5/23 3 7.5 mg 8 am     8 pm  5/24 4 Repeat INR 3pm 8 am  5/25 5     6

## 2014-06-28 ENCOUNTER — Other Ambulatory Visit: Payer: Self-pay

## 2014-06-28 ENCOUNTER — Other Ambulatory Visit: Payer: Self-pay | Admitting: Radiology

## 2014-06-28 MED ORDER — POTASSIUM CHLORIDE CRYS ER 20 MEQ PO TBCR: 20.0000 meq | EXTENDED_RELEASE_TABLET | Freq: Three times a day (TID) | ORAL | Status: DC

## 2014-06-29 ENCOUNTER — Other Ambulatory Visit: Payer: Self-pay | Admitting: Radiology

## 2014-06-30 ENCOUNTER — Encounter (HOSPITAL_COMMUNITY): Payer: Self-pay

## 2014-06-30 ENCOUNTER — Other Ambulatory Visit: Payer: Self-pay | Admitting: *Deleted

## 2014-06-30 ENCOUNTER — Other Ambulatory Visit (HOSPITAL_COMMUNITY): Payer: Self-pay | Admitting: Interventional Radiology

## 2014-06-30 ENCOUNTER — Ambulatory Visit (HOSPITAL_COMMUNITY)
Admission: RE | Admit: 2014-06-30 | Discharge: 2014-06-30 | Disposition: A | Discharge status: Home / Self Care | Payer: Medicare Other | Source: Ambulatory Visit | Attending: Interventional Radiology | Admitting: Interventional Radiology

## 2014-06-30 DIAGNOSIS — I2510 Atherosclerotic heart disease of native coronary artery without angina pectoris: Secondary | ICD-10-CM | POA: Diagnosis not present

## 2014-06-30 DIAGNOSIS — E039 Hypothyroidism, unspecified: Secondary | ICD-10-CM | POA: Diagnosis not present

## 2014-06-30 DIAGNOSIS — E785 Hyperlipidemia, unspecified: Secondary | ICD-10-CM | POA: Insufficient documentation

## 2014-06-30 DIAGNOSIS — F329 Major depressive disorder, single episode, unspecified: Secondary | ICD-10-CM | POA: Insufficient documentation

## 2014-06-30 DIAGNOSIS — M810 Age-related osteoporosis without current pathological fracture: Secondary | ICD-10-CM | POA: Diagnosis not present

## 2014-06-30 DIAGNOSIS — M109 Gout, unspecified: Secondary | ICD-10-CM | POA: Insufficient documentation

## 2014-06-30 DIAGNOSIS — I5032 Chronic diastolic (congestive) heart failure: Secondary | ICD-10-CM | POA: Diagnosis not present

## 2014-06-30 DIAGNOSIS — Z8249 Family history of ischemic heart disease and other diseases of the circulatory system: Secondary | ICD-10-CM | POA: Insufficient documentation

## 2014-06-30 DIAGNOSIS — K219 Gastro-esophageal reflux disease without esophagitis: Secondary | ICD-10-CM | POA: Insufficient documentation

## 2014-06-30 DIAGNOSIS — Z7901 Long term (current) use of anticoagulants: Secondary | ICD-10-CM | POA: Insufficient documentation

## 2014-06-30 DIAGNOSIS — R200 Anesthesia of skin: Secondary | ICD-10-CM

## 2014-06-30 DIAGNOSIS — Z7902 Long term (current) use of antithrombotics/antiplatelets: Secondary | ICD-10-CM | POA: Insufficient documentation

## 2014-06-30 DIAGNOSIS — M1990 Unspecified osteoarthritis, unspecified site: Secondary | ICD-10-CM | POA: Insufficient documentation

## 2014-06-30 DIAGNOSIS — Z91040 Latex allergy status: Secondary | ICD-10-CM | POA: Insufficient documentation

## 2014-06-30 DIAGNOSIS — R202 Paresthesia of skin: Secondary | ICD-10-CM

## 2014-06-30 DIAGNOSIS — Z87891 Personal history of nicotine dependence: Secondary | ICD-10-CM | POA: Insufficient documentation

## 2014-06-30 DIAGNOSIS — I708 Atherosclerosis of other arteries: Secondary | ICD-10-CM | POA: Insufficient documentation

## 2014-06-30 DIAGNOSIS — M4802 Spinal stenosis, cervical region: Secondary | ICD-10-CM | POA: Diagnosis not present

## 2014-06-30 DIAGNOSIS — M797 Fibromyalgia: Secondary | ICD-10-CM | POA: Diagnosis not present

## 2014-06-30 DIAGNOSIS — G450 Vertebro-basilar artery syndrome: Secondary | ICD-10-CM | POA: Diagnosis not present

## 2014-06-30 DIAGNOSIS — Z951 Presence of aortocoronary bypass graft: Secondary | ICD-10-CM | POA: Diagnosis not present

## 2014-06-30 DIAGNOSIS — G4700 Insomnia, unspecified: Secondary | ICD-10-CM | POA: Insufficient documentation

## 2014-06-30 DIAGNOSIS — I10 Essential (primary) hypertension: Secondary | ICD-10-CM | POA: Diagnosis not present

## 2014-06-30 DIAGNOSIS — G4440 Drug-induced headache, not elsewhere classified, not intractable: Secondary | ICD-10-CM

## 2014-06-30 DIAGNOSIS — I482 Chronic atrial fibrillation: Secondary | ICD-10-CM | POA: Diagnosis not present

## 2014-06-30 DIAGNOSIS — Z85820 Personal history of malignant melanoma of skin: Secondary | ICD-10-CM | POA: Diagnosis not present

## 2014-06-30 DIAGNOSIS — J449 Chronic obstructive pulmonary disease, unspecified: Secondary | ICD-10-CM | POA: Diagnosis not present

## 2014-06-30 LAB — BASIC METABOLIC PANEL
ANION GAP: 10 (ref 5–15)
BUN: 9 mg/dL (ref 6–20)
CO2: 29 mmol/L (ref 22–32)
CREATININE: 0.8 mg/dL (ref 0.44–1.00)
Calcium: 9.7 mg/dL (ref 8.9–10.3)
Chloride: 101 mmol/L (ref 101–111)
Glucose, Bld: 126 mg/dL — ABNORMAL HIGH (ref 65–99)
Potassium: 3.9 mmol/L (ref 3.5–5.1)
Sodium: 140 mmol/L (ref 135–145)

## 2014-06-30 LAB — CBC
HEMATOCRIT: 43.4 % (ref 36.0–46.0)
HEMOGLOBIN: 14.7 g/dL (ref 12.0–15.0)
MCH: 31.1 pg (ref 26.0–34.0)
MCHC: 33.9 g/dL (ref 30.0–36.0)
MCV: 91.8 fL (ref 78.0–100.0)
Platelets: 230 10*3/uL (ref 150–400)
RBC: 4.73 MIL/uL (ref 3.87–5.11)
RDW: 14.2 % (ref 11.5–15.5)
WBC: 6.6 10*3/uL (ref 4.0–10.5)

## 2014-06-30 LAB — PROTIME-INR
INR: 1.25 (ref 0.00–1.49)
Prothrombin Time: 15.9 seconds — ABNORMAL HIGH (ref 11.6–15.2)

## 2014-06-30 MED ORDER — FENTANYL CITRATE (PF) 100 MCG/2ML IJ SOLN
INTRAMUSCULAR | Status: AC
  Filled 2014-06-30: qty 2

## 2014-06-30 MED ORDER — SODIUM CHLORIDE 0.9 % IV SOLN
INTRAVENOUS | Status: AC | PRN
  Administered 2014-06-30: 10 mL/h via INTRAVENOUS

## 2014-06-30 MED ORDER — MIDAZOLAM HCL 2 MG/2ML IJ SOLN
INTRAMUSCULAR | Status: AC | PRN
  Administered 2014-06-30: 1 mg via INTRAVENOUS

## 2014-06-30 MED ORDER — MIDAZOLAM HCL 2 MG/2ML IJ SOLN
INTRAMUSCULAR | Status: AC
  Filled 2014-06-30: qty 2

## 2014-06-30 MED ORDER — FENTANYL CITRATE (PF) 100 MCG/2ML IJ SOLN
INTRAMUSCULAR | Status: AC | PRN
  Administered 2014-06-30: 25 ug via INTRAVENOUS

## 2014-06-30 MED ORDER — LIDOCAINE HCL 1 % IJ SOLN
INTRAMUSCULAR | Status: AC
  Filled 2014-06-30: qty 20

## 2014-06-30 MED ORDER — HEPARIN SODIUM (PORCINE) 1000 UNIT/ML IJ SOLN
INTRAMUSCULAR | Status: AC | PRN
  Administered 2014-06-30: 1000 [IU] via INTRAVENOUS

## 2014-06-30 MED ORDER — SODIUM CHLORIDE 0.9 % IV SOLN: INTRAVENOUS | Status: AC

## 2014-06-30 MED ORDER — ENOXAPARIN SODIUM 80 MG/0.8ML ~~LOC~~ SOLN: 80.0000 mg | Freq: Two times a day (BID) | SUBCUTANEOUS | Status: DC

## 2014-06-30 MED ORDER — SODIUM CHLORIDE 0.9 % IV SOLN
INTRAVENOUS | Status: DC
  Administered 2014-06-30: 10:00:00 via INTRAVENOUS

## 2014-06-30 MED ORDER — IOHEXOL 300 MG/ML  SOLN
150.0000 mL | Freq: Once | INTRAMUSCULAR | Status: AC | PRN
Start: 2014-06-30 — End: 2014-06-30
  Administered 2014-06-30: 80 mL via INTRAVENOUS

## 2014-06-30 MED ORDER — HEPARIN SOD (PORK) LOCK FLUSH 100 UNIT/ML IV SOLN
INTRAVENOUS | Status: AC
  Filled 2014-06-30: qty 20

## 2014-06-30 NOTE — Sedation Documentation (Signed)
Review groin precautions with pt and dtr at bs

## 2014-06-30 NOTE — Telephone Encounter (Signed)
Patient's daughter called to inform us that the patient is having another cerebral arteriogram on 07/19/14. Spoke with Gay Filler, PharmD and she states the patient is approved to hold her Coumadin and have a Lovonex bridge for the procedure.  The patient and family member verbalized understanding on following her previous dosing  instructions until her appointment on Tuesday at 3 pm.

## 2014-06-30 NOTE — H&P (Signed)
Chief Complaint: "I'm here for an angiogram"  HPI: Amber [name] is an 80 y.o. female with hx of cerebrovascular disease, afib, CAD, and prior TIAs/CVAs She has known intracranial arterial stenosis including an occluded right vert art and left vertebral stenosis. She does reports sxs c/w short TIA that involved weakness of her right arm and leag a few days ago. Her sxs resolved after a few minutes.  She is normally on Coumadin, but this has been held and she has been bridged with Lovenox. Last Lovenox was yesterday morning. Has been NPO this am  Past Medical History:  Past Medical History  Diagnosis Date  . Coronary artery disease     a. s/p CABG;  b. 04/2010 Neg MV  . Hypertension   . Hyperlipidemia   . Hypothyroidism   . Depression   . Insomnia   . Fibromyalgia   . Gout   . Osteoarthritis   . Osteoporosis   . Cervical spinal stenosis   . Nasal polyposis   . Pneumonia     1990  . COPD with emphysema     PFT 05/02/10>>FEV1 1.35(62%), FEV1% 66, DLCO 75%  . Chronic diastolic CHF (congestive heart failure)     a. 02/2013 Echo: EF 55-60%, mild AI, mod dil LA.  [name] Atrial fibrillation     a. chronic/rate controlled;  b. chronic coumadin.  . CVA (cerebral vascular accident)     left sided weakness  . GERD (gastroesophageal reflux disease)     pepcid 2-3 times per week  . Intermittent confusion   . History of melanoma     squamous cell, melanoma  . Memory change 01/23/2014    Past Surgical History:  Past Surgical History  Procedure Laterality Date  . Abdominal hysterectomy    . Breast lumpectomy  1980s    Benign lesion - right  . Carpel tunnel      right  . Tonsillectomy    . Eye surgery      bilateral cataracts  . Coronary artery bypass graft  2000  . Radiology with anesthesia  12/24/2011    Procedure: RADIOLOGY WITH ANESTHESIA;  Surgeon: Medication Radiologist, MD;  Location: [facility];  Service: Radiology;  Laterality: N/A;  Extra Cranial Vascular Stent    Family  History:  Family History  Problem Relation Age of Onset  . Stroke Mother   . CAD Father   . Diabetes type II Brother   . CAD Brother     Social History:  reports that she quit smoking about 37 years ago. Her smoking use included Cigarettes. She has a 37.5 pack-year smoking history. She has never used smokeless tobacco. She reports that she does not drink alcohol or use illicit drugs.  Allergies:  Allergies  Allergen Reactions  . Lipitor [Atorvastatin Calcium] Other (See Comments)    Increased fibromyalgia pain  . Mevacor [Lovastatin] Other (See Comments)    Increased fibromyalgia pain  . Pravachol Other (See Comments)    Increased fibromyalgia pain  . Ezetimibe Other (See Comments)    Myalgia  . Welchol [Colesevelam Hcl] Other (See Comments)    Muscle aches  . Adhesive [Tape] Itching, Rash and Other (See Comments)    Burning   . Ceclor [Cefaclor] Other (See Comments)    Reaction unknown  . Elastic Bandages & [Zinc] Rash and Other (See Comments)    Turns red on the areas it touches  . Latex Itching, Rash and Other (See Comments)    Burning   .  Vasotec Other (See Comments)    Reaction unknown    Medications:   Medication List    ASK your doctor about these medications        acetaminophen 500 MG tablet  Commonly known as:  TYLENOL  Take 500 mg by mouth every 8 (eight) hours as needed for mild pain or moderate pain.     allopurinol 300 MG tablet  Commonly known as:  ZYLOPRIM  TAKE 1 TABLET EVERY DAY     amitriptyline 50 MG tablet  Commonly known as:  ELAVIL  Take 50 mg by mouth at bedtime.     amitriptyline 25 MG tablet  Commonly known as:  ELAVIL  TAKE 1 TABLET (25 MG TOTAL) BY MOUTH AT BEDTIME.     amLODipine 5 MG tablet  Commonly known as:  NORVASC  TAKE 1 TABLET (5 MG TOTAL) BY MOUTH EVERY EVENING.     CALCIUM + D + K PO  Take 1 tablet by mouth daily.     celecoxib 200 MG capsule  Commonly known as:  CELEBREX  Take 200 mg by mouth 2 (two) times  daily.     citalopram 20 MG tablet  Commonly known as:  CELEXA  TAKE 1 TABLET (20 MG TOTAL) BY MOUTH 2 (TWO) TIMES DAILY.     clopidogrel 75 MG tablet  Commonly known as:  PLAVIX  TAKE 1 TABLET BY MOUTH EVERY DAY     digoxin 0.125 MG tablet  Commonly known as:  LANOXIN  TAKE 1 TABLET EVERY DAY     donepezil 5 MG tablet  Commonly known as:  ARICEPT  Take 5 mg by mouth daily.     enoxaparin 80 MG/0.8ML injection  Commonly known as:  LOVENOX  Inject 0.8 mLs (80 mg total) into the skin every 12 (twelve) hours.     ergocalciferol 50000 UNITS capsule  Commonly known as:  VITAMIN D2  Take 50,000 Units by mouth 2 (two) times a week. Sunday and Wednesday     FOSAMAX 70 MG tablet  Generic drug:  alendronate  Take 70 mg by mouth every Sunday. Take with a full glass of water on an empty stomach.     furosemide 40 MG tablet  Commonly known as:  LASIX  Take 40 mg by mouth at bedtime.     furosemide 80 MG tablet  Commonly known as:  LASIX  TAKE 1 TABLET BY MOUTH TWICE A DAY     levothyroxine 25 MCG tablet  Commonly known as:  SYNTHROID, LEVOTHROID  Take 25 mcg by mouth daily.     metoprolol succinate 25 MG 24 hr tablet  Commonly known as:  TOPROL-XL  TAKE 1 TABLET (25 MG TOTAL) BY MOUTH ONCE.     multivitamin with minerals tablet  Take 1 tablet by mouth daily.     NAMENDA XR 28 MG Cp24 24 hr capsule  Generic drug:  memantine  TAKE ONE CAPSULE BY MOUTH EVERY DAY     nitroGLYCERIN 0.4 MG SL tablet  Commonly known as:  NITROSTAT  Place 1 tablet (0.4 mg total) under the tongue every 5 (five) minutes as needed for chest pain.     potassium chloride SA 20 MEQ tablet  Commonly known as:  KLOR-CON M20  Take 1 tablet (20 mEq total) by mouth 3 (three) times daily.     PROAIR HFA 108 (90 BASE) MCG/ACT inhaler  Generic drug:  albuterol  INHALE 2 PUFFS INTO LUNGS EVERY 6 HOURS AS NEEDED FOR  WHEEZING     SYSTANE OP  Apply 1 drop to eye as needed (dry, red eyes).     valsartan  40 MG tablet  Commonly known as:  DIOVAN  Take 1 tablet (40 mg total) by mouth 2 (two) times daily.     warfarin 5 MG tablet  Commonly known as:  COUMADIN  Take 1 tablet (5 mg total) by mouth as directed. Take as directed by the Coumadin Clinic     COUMADIN 5 MG tablet  Generic drug:  warfarin  TAKE 1 TABLET (5 MG TOTAL) BY MOUTH AS DIRECTED.        Please HPI for pertinent positives, otherwise complete 10 system ROS negative.  Physical Exam: BP 159/66 mmHg  Pulse 57  Temp(Src) 97.6 F (36.4 C) (Oral)  Resp 18  Ht 5\' 4"  (1.626 m)  Wt 166 lb (75.297 kg)  BMI 28.48 kg/m2  SpO2 96% Body mass index is 28.48 kg/(m^2).   General Appearance:  Alert, cooperative, no distress, appears stated age  Head:  Normocephalic, without obvious abnormality, atraumatic  ENT: Unremarkable  Neck: Supple, symmetrical, trachea midline  Lungs:   Clear to auscultation bilaterally, no w/r/r, respirations unlabored without use of accessory muscles.  Chest Wall:  No tenderness or deformity  Heart:  Regular rate and rhythm, S1, S2 normal, no murmur, rub or gallop.  Abdomen:   Soft, non-tender, non distended.  Extremities: Extremities normal, atraumatic, no cyanosis or edema  Pulses: 2+ and symmetric femoral  Neurologic: Normal affect, no gross deficits.  Labs: Results for orders placed or performed during the hospital encounter of 06/30/14 (from the past 48 hour(s))  CBC     Status: None   Collection Time: 06/30/14 10:00 AM  Result Value Ref Range   WBC 6.6 4.0 - 10.5 K/uL   RBC 4.73 3.87 - 5.11 MIL/uL   Hemoglobin 14.7 12.0 - 15.0 g/dL   HCT 43.4 36.0 - 46.0 %   MCV 91.8 78.0 - 100.0 fL   MCH 31.1 26.0 - 34.0 pg   MCHC 33.9 30.0 - 36.0 g/dL   RDW 14.2 11.5 - 15.5 %   Platelets 230 150 - 400 K/uL  Protime-INR     Status: Abnormal   Collection Time: 06/30/14 10:00 AM  Result Value Ref Range   Prothrombin Time 15.9 (H) 11.6 - 15.2 seconds   INR 1.25 0.00 - 1.49    Imaging: No results  found.  Assessment/Plan Hx of TIA Vertebral artery stenosis/occlusive disease For cerebral arteriogram today Labs pending Procedure, risks, complications, use of sedation discussed with pt and daughter. Consent signed in chart  Ascencion Dike PA-C 06/30/2014, 10:49 AM

## 2014-06-30 NOTE — Procedures (Signed)
S/P bilateral common carotid and lt vertebral and RT subclavian arteriograms. RT CFA approach. Findings. 1.Occluded RT VA at origin. 2.approx 70 to 75 % stenosis of Lt VA origin

## 2014-06-30 NOTE — Sedation Documentation (Signed)
Transfer to short stay via stretcher with RN

## 2014-06-30 NOTE — Sedation Documentation (Signed)
MD at bedside.d/w pt and family findings of angiogram

## 2014-06-30 NOTE — Discharge Instructions (Signed)
Angiogram, Care After Refer to this sheet in the next few weeks. These instructions provide you with information on caring for yourself after your procedure. Your health care provider may also give you more specific instructions. Your treatment has been planned according to current medical practices, but problems sometimes occur. Call your health care provider if you have any problems or questions after your procedure.  WHAT TO EXPECT AFTER THE PROCEDURE After your procedure, it is typical to have the following sensations:  Minor discomfort or tenderness and a small bump at the catheter insertion site. The bump should usually decrease in size and tenderness within 1 to 2 weeks.  Any bruising will usually fade within 2 to 4 weeks. HOME CARE INSTRUCTIONS   You may need to keep taking blood thinners if they were prescribed for you. Take medicines only as directed by your health care provider.  Do not apply powder or lotion to the site.  Do not take baths, swim, or use a hot tub until your health care provider approves.  You may shower 24 hours after the procedure. Remove the bandage (dressing) and gently wash the site with plain soap and water. Gently pat the site dry.  Inspect the site at least twice daily.  Limit your activity for the first 48 hours. Do not bend, squat, or lift anything over 20 lb (9 kg) or as directed by your health care provider.  Plan to have someone take you home after the procedure. Follow instructions about when you can drive or return to work. SEEK MEDICAL CARE IF:  You get light-headed when standing up.  You have drainage (other than a small amount of blood on the dressing).  You have chills.  You have a fever.  You have redness, warmth, swelling, or pain at the insertion site. SEEK IMMEDIATE MEDICAL CARE IF:   You develop chest pain or shortness of breath, feel faint, or pass out.  You have bleeding, swelling larger than a walnut, or drainage from the  catheter insertion site.  You develop pain, discoloration, coldness, or severe bruising in the leg or arm that held the catheter.  You develop bleeding from any other place, such as the bowels. You may see bright red blood in your urine or stools, or your stools may appear black and tarry.  You have heavy bleeding from the site. If this happens, hold pressure on the site. MAKE SURE YOU:  Understand these instructions.  Will watch your condition.  Will get help right away if you are not doing well or get worse. Document Released: 08/15/2004 Document Revised: 06/13/2013 Document Reviewed: 06/21/2012 [facility] Patient Information 2015 [place], [place]. This information is not intended to replace advice given to you by your health care provider. Make sure you discuss any questions you have with your health care provider. Cerebral Angiogram, Care After Refer to this sheet in the next few weeks. These instructions provide you with information on caring for yourself after your procedure. Your health care provider may also give you more specific instructions. Your treatment has been planned according to current medical practices, but problems sometimes occur. Call your health care provider if you have any problems or questions after your procedure. WHAT TO EXPECT AFTER THE PROCEDURE After your procedure, it is typical to have the following:   Small lump at the insertion site.  Tenderness or bruising at the insertion site.  Mild headache. HOME CARE INSTRUCTIONS  Rest at home for the first day after your procedure.  Do not drive or operate heavy machinery as directed by your health care provider.  Do not exercise as directed by your health care provider.  Do not lift objects more than 10 pounds as directed by your health care provider.  Take medicines only as directed by your health care provider.  Follow your health care provider's instructions on:  Incision care.  Bandage (dressing)  changes and removal.  Check daily for signs of infection at the insertion site, such as redness, swelling, or discharge.  Drink enough water to keep your urine clear or pale yellow. This will help flush out the contrast dye.  Keep all follow-up visits as directed by your health care provider. This is important. SEEK MEDICAL CARE IF:  You have a fever.  The skin at the insertion site begins to separate.  You have drainage, redness, swelling, or pain at the insertion site.  You are dizzy.  You have a rash.  You are itchy.  You have difficulty urinating. SEEK IMMEDIATE MEDICAL CARE IF:  You have weakness in the face, arms, or legs.   You have difficulty talking or swallowing.   You have vision problems.   You have sudden swelling or pain at the insertion site.  You have a rash.   You have difficulty using the arm or leg in which the catheter was inserted.   You have chest pain.  You have difficulty breathing. Document Released: 06/13/2013 Document Reviewed: 02/09/2013 [facility] Patient Information 2015 [place]. This information is not intended to replace advice given to you by your health care provider. Make sure you discuss any questions you have with your health care provider.

## 2014-07-04 ENCOUNTER — Ambulatory Visit (INDEPENDENT_AMBULATORY_CARE_PROVIDER_SITE_OTHER): Payer: Medicare Other | Admitting: *Deleted

## 2014-07-04 DIAGNOSIS — I639 Cerebral infarction, unspecified: Secondary | ICD-10-CM | POA: Diagnosis not present

## 2014-07-04 DIAGNOSIS — I6350 Cerebral infarction due to unspecified occlusion or stenosis of unspecified cerebral artery: Secondary | ICD-10-CM

## 2014-07-04 DIAGNOSIS — I4891 Unspecified atrial fibrillation: Secondary | ICD-10-CM | POA: Diagnosis not present

## 2014-07-04 LAB — POCT INR: INR: 1.7

## 2014-07-04 MED ORDER — ENOXAPARIN SODIUM 80 MG/0.8ML ~~LOC~~ SOLN: 80.0000 mg | Freq: Two times a day (BID) | SUBCUTANEOUS | Status: DC

## 2014-07-07 ENCOUNTER — Ambulatory Visit (INDEPENDENT_AMBULATORY_CARE_PROVIDER_SITE_OTHER): Payer: Medicare Other

## 2014-07-07 DIAGNOSIS — I4891 Unspecified atrial fibrillation: Secondary | ICD-10-CM

## 2014-07-07 DIAGNOSIS — I639 Cerebral infarction, unspecified: Secondary | ICD-10-CM

## 2014-07-07 DIAGNOSIS — I6350 Cerebral infarction due to unspecified occlusion or stenosis of unspecified cerebral artery: Secondary | ICD-10-CM

## 2014-07-07 LAB — POCT INR: INR: 2.1

## 2014-07-13 ENCOUNTER — Other Ambulatory Visit (HOSPITAL_COMMUNITY): Payer: Self-pay | Admitting: Interventional Radiology

## 2014-07-13 ENCOUNTER — Telehealth: Payer: Self-pay | Admitting: Cardiology

## 2014-07-13 DIAGNOSIS — I771 Stricture of artery: Secondary | ICD-10-CM

## 2014-07-13 NOTE — Telephone Encounter (Signed)
Discuss Lovenox with  Dr. Mare Ferrari in am

## 2014-07-13 NOTE — Telephone Encounter (Signed)
New Message  Pt called states that her procedure was rescheduled. Pt states that she was taken off of Lovenox for the procedure so she is requesting a call back to determine if she should go back onto the Lovenox or how should she move forward until the day of the procedure. She is also requesting advise to determine if the appt with Dr. Mare Ferrari is still needed at this time or should she resch//sr

## 2014-07-14 MED ORDER — ENOXAPARIN SODIUM 80 MG/0.8ML ~~LOC~~ SOLN: 80.0000 mg | Freq: Two times a day (BID) | SUBCUTANEOUS | Status: DC

## 2014-07-14 NOTE — Telephone Encounter (Signed)
Discussed with  Dr. Mare Ferrari and Coumadin clinic Per daughter patient was to contact [facility] for instructions on when to hold Warfarin, that Dr Patrecia Pour only wanted INR less than 1.5 Discussed with Doroteo Bradford in Coumadin clinic and holding Warfarin for 3 days prior should be sufficient (last INR 2.1 on 5/27) Procedure scheduled for 07/19/14 Advised last dose of warfarin Saturday 07/15/14 No warfarin until after procedure and resume at 10 mg on 6/9 and 6/10. 6/11 resume usual dose of Warfarin  Patient will bridge with Lovenox 80 mg at 8 am and 8 pm on 6/6 and 6/9-6/12 and am only on 6/7 and 6/13 INR scheduled for 6/13 Daughter aware of instructions and read them back

## 2014-07-17 ENCOUNTER — Ambulatory Visit (HOSPITAL_COMMUNITY)
Admission: RE | Admit: 2014-07-17 | Discharge: 2014-07-17 | Disposition: A | Discharge status: Home / Self Care | Payer: Medicare Other | Source: Ambulatory Visit | Attending: Anesthesiology | Admitting: Anesthesiology

## 2014-07-17 ENCOUNTER — Encounter (HOSPITAL_COMMUNITY)
Admission: RE | Admit: 2014-07-17 | Discharge: 2014-07-17 | Disposition: A | Discharge status: Home / Self Care | Payer: Medicare Other | Source: Ambulatory Visit | Attending: Interventional Radiology | Admitting: Interventional Radiology

## 2014-07-17 ENCOUNTER — Encounter (HOSPITAL_COMMUNITY): Payer: Self-pay

## 2014-07-17 ENCOUNTER — Other Ambulatory Visit: Payer: Self-pay | Admitting: Radiology

## 2014-07-17 DIAGNOSIS — F0390 Unspecified dementia without behavioral disturbance: Secondary | ICD-10-CM | POA: Diagnosis not present

## 2014-07-17 DIAGNOSIS — Z7902 Long term (current) use of antithrombotics/antiplatelets: Secondary | ICD-10-CM | POA: Diagnosis not present

## 2014-07-17 DIAGNOSIS — D649 Anemia, unspecified: Secondary | ICD-10-CM | POA: Diagnosis not present

## 2014-07-17 DIAGNOSIS — K219 Gastro-esophageal reflux disease without esophagitis: Secondary | ICD-10-CM | POA: Diagnosis not present

## 2014-07-17 DIAGNOSIS — Z5309 Procedure and treatment not carried out because of other contraindication: Secondary | ICD-10-CM | POA: Diagnosis not present

## 2014-07-17 DIAGNOSIS — E785 Hyperlipidemia, unspecified: Secondary | ICD-10-CM | POA: Diagnosis not present

## 2014-07-17 DIAGNOSIS — M797 Fibromyalgia: Secondary | ICD-10-CM | POA: Diagnosis not present

## 2014-07-17 DIAGNOSIS — M109 Gout, unspecified: Secondary | ICD-10-CM | POA: Diagnosis not present

## 2014-07-17 DIAGNOSIS — I482 Chronic atrial fibrillation: Secondary | ICD-10-CM | POA: Diagnosis not present

## 2014-07-17 DIAGNOSIS — Z01818 Encounter for other preprocedural examination: Secondary | ICD-10-CM

## 2014-07-17 DIAGNOSIS — I6509 Occlusion and stenosis of unspecified vertebral artery: Secondary | ICD-10-CM

## 2014-07-17 DIAGNOSIS — F329 Major depressive disorder, single episode, unspecified: Secondary | ICD-10-CM | POA: Diagnosis not present

## 2014-07-17 DIAGNOSIS — I2510 Atherosclerotic heart disease of native coronary artery without angina pectoris: Secondary | ICD-10-CM | POA: Diagnosis not present

## 2014-07-17 DIAGNOSIS — I739 Peripheral vascular disease, unspecified: Secondary | ICD-10-CM | POA: Diagnosis not present

## 2014-07-17 DIAGNOSIS — Z7901 Long term (current) use of anticoagulants: Secondary | ICD-10-CM | POA: Diagnosis not present

## 2014-07-17 DIAGNOSIS — R0602 Shortness of breath: Secondary | ICD-10-CM

## 2014-07-17 DIAGNOSIS — I5032 Chronic diastolic (congestive) heart failure: Secondary | ICD-10-CM | POA: Diagnosis not present

## 2014-07-17 DIAGNOSIS — I10 Essential (primary) hypertension: Secondary | ICD-10-CM | POA: Diagnosis not present

## 2014-07-17 DIAGNOSIS — Z87891 Personal history of nicotine dependence: Secondary | ICD-10-CM | POA: Diagnosis not present

## 2014-07-17 DIAGNOSIS — Z951 Presence of aortocoronary bypass graft: Secondary | ICD-10-CM | POA: Diagnosis not present

## 2014-07-17 DIAGNOSIS — I6503 Occlusion and stenosis of bilateral vertebral arteries: Secondary | ICD-10-CM | POA: Diagnosis not present

## 2014-07-17 DIAGNOSIS — Z7983 Long term (current) use of bisphosphonates: Secondary | ICD-10-CM | POA: Diagnosis not present

## 2014-07-17 DIAGNOSIS — J439 Emphysema, unspecified: Secondary | ICD-10-CM | POA: Diagnosis not present

## 2014-07-17 DIAGNOSIS — I69354 Hemiplegia and hemiparesis following cerebral infarction affecting left non-dominant side: Secondary | ICD-10-CM | POA: Diagnosis not present

## 2014-07-17 DIAGNOSIS — Z79899 Other long term (current) drug therapy: Secondary | ICD-10-CM | POA: Diagnosis not present

## 2014-07-17 DIAGNOSIS — E039 Hypothyroidism, unspecified: Secondary | ICD-10-CM | POA: Diagnosis not present

## 2014-07-17 LAB — COMPREHENSIVE METABOLIC PANEL
ALK PHOS: 42 U/L (ref 38–126)
ALT: 26 U/L (ref 14–54)
ANION GAP: 10 (ref 5–15)
AST: 46 U/L — AB (ref 15–41)
Albumin: 4 g/dL (ref 3.5–5.0)
BILIRUBIN TOTAL: 0.7 mg/dL (ref 0.3–1.2)
BUN: 20 mg/dL (ref 6–20)
CO2: 25 mmol/L (ref 22–32)
CREATININE: 0.98 mg/dL (ref 0.44–1.00)
Calcium: 9 mg/dL (ref 8.9–10.3)
Chloride: 102 mmol/L (ref 101–111)
GFR calc Af Amer: >60 mL/min (ref >60)
GFR calc non Af Amer: 54 mL/min — ABNORMAL LOW (ref >60)
Glucose, Bld: 175 mg/dL — ABNORMAL HIGH (ref 65–99)
Potassium: 4 mmol/L (ref 3.5–5.1)
Sodium: 137 mmol/L (ref 135–145)
TOTAL PROTEIN: 6.1 g/dL — AB (ref 6.5–8.1)

## 2014-07-17 LAB — CBC WITH DIFFERENTIAL/PLATELET
Basophils Absolute: 0.1 10*3/uL (ref 0.0–0.1)
Basophils Relative: 1 % (ref 0–1)
EOS ABS: 0.2 10*3/uL (ref 0.0–0.7)
EOS PCT: 3 % (ref 0–5)
HEMATOCRIT: 41.6 % (ref 36.0–46.0)
HEMOGLOBIN: 14 g/dL (ref 12.0–15.0)
Lymphocytes Relative: 29 % (ref 12–46)
Lymphs Abs: 1.9 10*3/uL (ref 0.7–4.0)
MCH: 30.8 pg (ref 26.0–34.0)
MCHC: 33.7 g/dL (ref 30.0–36.0)
MCV: 91.4 fL (ref 78.0–100.0)
Monocytes Absolute: 0.5 10*3/uL (ref 0.1–1.0)
Monocytes Relative: 8 % (ref 3–12)
Neutro Abs: 4 10*3/uL (ref 1.7–7.7)
Neutrophils Relative %: 59 % (ref 43–77)
PLATELETS: 233 10*3/uL (ref 150–400)
RBC: 4.55 MIL/uL (ref 3.87–5.11)
RDW: 14.1 % (ref 11.5–15.5)
WBC: 6.6 10*3/uL (ref 4.0–10.5)

## 2014-07-17 NOTE — Progress Notes (Signed)
Anesthesia PAT Evaluation:  Patient is a 80 year old female scheduled for angioplasty on 07/19/14 by Dr. Estanislado Pandy. She has occluded right VA and ~ 70-75% stenosis at the left VA origin.  History includes CAD s/p CABG '00, CVA '01 with left hemiparesis and left facial numbness (on Plavix), HTN, hypercholesterolemia (intolerant to statins and ezetimibe), chronic afib (on Coumadin), chronic diastolic CHF, chronically elevated CK levels (per cardiology notes), fibromyalgia, hypothyroidism, mild memory impairment, COPD, melanoma excision, cervical spine stenosis, nasal polyposis, GERD. PCP is Dr. Antony Contras. Neurologist is Dr. Jannifer Franklin, next visit scheduled for 07/26/14.  She was seen by pulmonologist is Dr. Halford Chessman in 2014 who felt her respiratory symptoms were not likely related to her COPD (more CHF) and referred her back to cardiology.    Cardiologist is Dr. Darlin Coco, last visit 03/21/14 with scheduled follow-up later this month. By notes, she appeared to be doing well with 4 month follow-up recommended. He is aware of plans for procedure (see notes in Epic by Alvina Filbert) and the [facility] has provided anti-coagulation instructions.  Dr. Estanislado Pandy only wants INR < 1.5, so warfarin is only being held for three days with plans to bridge with Lovenox.   Her daughter [name] is a Marine scientist for 30 + years and working in documentation for [employer].  Meds include Fosamax, allopurinol, amitriptyline, amlodipine, Celebrex, Celexa, Plavix, digoxin, Aricept, warfarin (Lovenox bridge while on hold), Lasix 120 mg AM and 80 mg PM, levothyroxine, Toprol, Nitro, KCl, Proair, valsartan.   PAT VITALS: T 36.8 C, HR 56 bpm, RR 18, BP 123/35, O2 sat 95%.   07/17/14 EKG X 2 showed afib with HR 58-61 bpm. ST/T wave abnormality in inferior and anterolateral leads. She has had similar ST/T changes on EKGs dating back to at least 02/18/14, although her T wave abnormality in V4-5 appears more pronounced but may be due to  lead artifact.   03/23/13 Nuclear stress test: Overall Impression: The risk of this scan cannot be given because there is no wall motion data. There is a medium-sized area of moderate ischemia affecting the inferior wall in the inferolateral wall. There may be mild scar in the inferior wall. LV Ejection Fraction: study not gated. LV Wall Motion: Wall motion data is not available. The study could not be gated because of the atrial fibrillation. (Dr. Mare Ferrari reviewed report and commented, "The stress test is similar to the one in July 2014. There is inferior wall scar with surrounding peri-infarct ischemia. Patient is not having any angina. Continue medical treatment ie plavix, warfarin, and BB.")  02/13/13 Echo: - Left ventricle: The cavity size was normal. Systolic function was normal. The estimated ejection fraction was in the range of 55% to 60%. Wall motion was normal; there were no regional wall motion abnormalities. - Aortic valve: Mild regurgitation. - Left atrium: The atrium was moderately dilated. - Right atrium: The atrium was mildly to moderately dilated. - Pulmonary arteries: Systolic pressure was moderately increased. PA peak pressure: 64mm Hg (S).  06/30/14 CEREBRAL ANGIOGRAM; S/P bilateral common carotid and lt vertebral and RT subclavian arteriograms : Findings. 1.Occluded RT VA at origin. 2.approx 70 to 75 % stenosis of Lt VA origin.  05/03/14 CTA of the neck/head: IMPRESSION: - Occlusion of the proximal right vertebral artery with reconstitution and decreased flow in the right vertebral artery. This was present on the prior arteriogram. - 50% diameter stenosis proximal left vertebral artery - Mild atherosclerotic disease in the carotid artery bilaterally. - No significant  intracranial stenosis. Mild stenosis proximal left posterior cerebral artery.  03/01/14 Carotid duplex: Bilateral: 1-39% ICA stenosis. Note: Right proximal ICA stenosis is in the 40-59% range by  velocities and ICA/CCA ratio, but plaque morphology does not support this. Vertebral artery flow is antegrade. Duplex imaging of the brachial artery demonstrates triphasic waveforms.  02/18/14 Brain MRI: IMPRESSION: 1. No acute intracranial abnormality.2. Chronic pontine and right cerebellar infarcts.  08/06/12 PFT > FVC 1.01 (37%), FEV1 0.95 (46%), FEV1/FVC 94% (125%), FEF 25-75% 1.80 (104%). Poor test effort. PFT 05/02/10 > FVC 2.05 (71%), FEV1 1.35 (62%), FEV1/FVC 66% (88%), FEF 25-75% 0.66 (38%), DLCO 75%.  07/17/14 CXR IMPRESSION: No radiographic evidence of acute cardiopulmonary disease.  Preoperative labs noted. Non-fasting glucose 175. Cr 0.98, BUN 20, AST mildly elevated at 46 (previously 44). .CBC with diff WNL. Per IR, PT/PTT to be done on the day of surgery.  She reports that she has not really had any new CV/CHF symptoms since she last saw Dr. Mare Ferrari.  She denied SOB at rest.  No significant palpitations.  No dizziness or syncope. She sleeps in a recliner.  No home O2.  No recent MDI use.  She weighs herself daily and can have ~ 5 lb fluctuation.  Her weights are automatically downloaded to her [place], and she had not had any calls of concerns about her weight changes. If she is retaining fluid she feels her mid-left chest will feel heavy, like she needs to take a deep breath but improves after her Lasix.  Since her morning Lasix dose was increased a few months ago, she has not had any significant issues with this. She is not able to be very active due to her left hemiparesis and fall risk.  She walks in her house with a rolling walker. She no longer does her own house cleaning. Of note, in early May 2016 she said she was typing and felt her right arm and right face go numb for a few seconds (< 60 seconds).  She felt her RUE felt clumsy during the few seconds and then her symptoms resolved.  She did not notify anyone until she was evaluated by Dr. Estanislado Pandy and his PA Ascencion Dike on 06/30/14  when she underwent her cerebral angiogram which showed 70-75% left VA stenosis. She feels her overall vision has decreased but can't say for sure how gradual of a process, but she denied diplopia, notable anges in peripheral vision or gross visual cut defects. Denies new neuro symptoms. She was on a Lovenox bridge for this procedure as well.    On exam, she is sitting in a wheelchair with husband at side.  No conversational dyspnea. Lungs clear throughout.  Heart irregularly irregularly.  No significant murmur noted. Mild pre-tibial edema. Facial symmetry, tongue midline, negative for pronator drift. She MAE X 4, but there is left hemiparesis.  She is wearing a LLE brace.   Discussed above cardiac and neuro history with anesthesiologist Dr. Lissa Hoard.  Patient has had cardiology follow-up within the past four months and had a stable stress test last year.  She has required an increase in her Lasix, but feels her CV/CHF symptoms are stable since her last cardiology visit.  Dr. Mare Ferrari has recommended Lovenox bridge while off warfarin for this procedure.  She denied any recurrent neurologic symptoms, and was told to see immediate evaluation if any changes.  She will get coag studies and further anesthesia and IR evaluation on the day of surgery.  If no acute issues  and labs are felt acceptable then it is anticipated that she can proceed as planned.   George [facility] Short Stay Center/Anesthesiology Phone [phone] 07/17/2014 5:00 PM

## 2014-07-17 NOTE — Progress Notes (Signed)
Patient reports that Dr. Mare Ferrari is her cardiologist, Dr. Jannifer Franklin is her neurologist. Patient reports ~ 2 weeks ago she lost control of her right arm and right leg and developed right facial numbness. She reports that the loss of extremity control resolved after a few minutes, however she is still having numbness on her right face. Patient reports heaviness in her center of chest for approximately 2 weeks with increased shortness of breath for approximately 1 week and that her voice is feels raspy which is new for her. Denies fever or chills. No dependent edema noted. Patient did not appear to have increased work of breathing, denies pain with respiration.   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 Patient does reports short term memory loss but is able to give history and recall date, time, location, and planned procedure.

## 2014-07-17 NOTE — Pre-Procedure Instructions (Signed)
Amber [name]  07/17/2014     Your procedure is scheduled on June 8.  Report to [facility] Admitting at 6:30 A.M.  Call this number if you have problems the morning of surgery:  [phone]   Remember:  Do not eat food or drink liquids after midnight.  Take these medicines the morning of surgery with A SIP OF WATER: Tylenol (if needed), Citalopram, Digoxin,  Donepezil, Levothyroxine, Metoprolol, Namenda, Proair (if needed),    STOP/ Do not take  Aleve, Naproxen, Advil, Ibuprofen, Motrin, Vitamins, Herbs, or Supplements starting today   Do not wear jewelry, make-up or nail polish.  Do not wear lotions, powders, or perfumes.  You may wear deodorant.  Do not shave 48 hours prior to surgery.  Men may shave face and neck.  Do not bring valuables to the hospital.  [facility] is not responsible for any belongings or valuables.  Contacts, dentures or bridgework may not be worn into surgery.  Leave your suitcase in the car.  After surgery it may be brought to your room.  For patients admitted to the hospital, discharge time will be determined by your treatment team.  Patients discharged the day of surgery will not be allowed to drive home.   [place] - Preparing for Surgery  Before surgery, you can play an important role.  Because skin is not sterile, your skin needs to be as free of germs as possible.  You can reduce the number of germs on you skin by washing with CHG (chlorahexidine gluconate) soap before surgery.  CHG is an antiseptic cleaner which kills germs and bonds with the skin to continue killing germs even after washing.  Please DO NOT use if you have an allergy to CHG or antibacterial soaps.  If your skin becomes reddened/irritated stop using the CHG and inform your nurse when you arrive at Short Stay.  Do not shave (including legs and underarms) for at least 48 hours prior to the first CHG shower.  You may shave your face.  Please follow these instructions  carefully:   1.  Shower with CHG Soap the night before surgery and the morning of Surgery.  2.  If you choose to wash your hair, wash your hair first as usual with your normal shampoo.  3.  After you shampoo, rinse your hair and body thoroughly to remove the shampoo.  4.  Use CHG as you would any other liquid soap.  You can apply CHG directly to the skin and wash gently with scrungie or a clean washcloth.  5.  Apply the CHG Soap to your body ONLY FROM THE NECK DOWN.  Do not use on open wounds or open sores.  Avoid contact with your eyes, ears, mouth and genitals (private parts).  Wash genitals (private parts) with your normal soap.  6.  Wash thoroughly, paying special attention to the area where your surgery will be performed.  7.  Thoroughly rinse your body with warm water from the neck down.  8.  DO NOT shower/wash with your normal soap after using and rinsing off the CHG Soap.  9.  Pat yourself dry with a clean towel.            10.  Wear clean pajamas.            11.  Place clean sheets on your bed the night of your first shower and do not sleep with pets.  Day of Surgery  Do  not apply any lotions the morning of surgery.  Please wear clean clothes to the hospital/surgery center.    Please read over the following fact sheets that you were given. Pain Booklet, Coughing and Deep Breathing and Surgical Site Infection Prevention

## 2014-07-17 NOTE — Progress Notes (Signed)
Spoke to Monia Sabal, [place] regarding consent order she advised that she will give order DOS. Will obtain PT/ INR, PTT DOS d/t patient not being off of coumadin for three days during PAT visit

## 2014-07-19 ENCOUNTER — Encounter (HOSPITAL_COMMUNITY)
Admission: RE | Disposition: A | Discharge status: Home / Self Care | Payer: Self-pay | Source: Ambulatory Visit | Attending: Interventional Radiology

## 2014-07-19 ENCOUNTER — Ambulatory Visit (HOSPITAL_COMMUNITY): Payer: Medicare Other | Admitting: Certified Registered Nurse Anesthetist

## 2014-07-19 ENCOUNTER — Encounter (HOSPITAL_COMMUNITY): Payer: Self-pay

## 2014-07-19 ENCOUNTER — Ambulatory Visit (HOSPITAL_COMMUNITY)
Admission: RE | Admit: 2014-07-19 | Discharge: 2014-07-19 | Disposition: A | Discharge status: Home / Self Care | Payer: Medicare Other | Source: Ambulatory Visit | Attending: Interventional Radiology | Admitting: Interventional Radiology

## 2014-07-19 ENCOUNTER — Ambulatory Visit (HOSPITAL_COMMUNITY): Payer: Medicare Other | Admitting: Vascular Surgery

## 2014-07-19 ENCOUNTER — Encounter (HOSPITAL_COMMUNITY): Payer: Self-pay | Admitting: Certified Registered Nurse Anesthetist

## 2014-07-19 DIAGNOSIS — I2510 Atherosclerotic heart disease of native coronary artery without angina pectoris: Secondary | ICD-10-CM | POA: Diagnosis not present

## 2014-07-19 DIAGNOSIS — Z79899 Other long term (current) drug therapy: Secondary | ICD-10-CM | POA: Insufficient documentation

## 2014-07-19 DIAGNOSIS — I69354 Hemiplegia and hemiparesis following cerebral infarction affecting left non-dominant side: Secondary | ICD-10-CM | POA: Diagnosis not present

## 2014-07-19 DIAGNOSIS — Z7902 Long term (current) use of antithrombotics/antiplatelets: Secondary | ICD-10-CM | POA: Insufficient documentation

## 2014-07-19 DIAGNOSIS — I739 Peripheral vascular disease, unspecified: Secondary | ICD-10-CM | POA: Insufficient documentation

## 2014-07-19 DIAGNOSIS — E039 Hypothyroidism, unspecified: Secondary | ICD-10-CM | POA: Insufficient documentation

## 2014-07-19 DIAGNOSIS — Z5309 Procedure and treatment not carried out because of other contraindication: Secondary | ICD-10-CM | POA: Insufficient documentation

## 2014-07-19 DIAGNOSIS — Z7983 Long term (current) use of bisphosphonates: Secondary | ICD-10-CM | POA: Insufficient documentation

## 2014-07-19 DIAGNOSIS — F0390 Unspecified dementia without behavioral disturbance: Secondary | ICD-10-CM | POA: Insufficient documentation

## 2014-07-19 DIAGNOSIS — M109 Gout, unspecified: Secondary | ICD-10-CM | POA: Insufficient documentation

## 2014-07-19 DIAGNOSIS — D649 Anemia, unspecified: Secondary | ICD-10-CM | POA: Insufficient documentation

## 2014-07-19 DIAGNOSIS — I10 Essential (primary) hypertension: Secondary | ICD-10-CM | POA: Insufficient documentation

## 2014-07-19 DIAGNOSIS — Z87891 Personal history of nicotine dependence: Secondary | ICD-10-CM | POA: Insufficient documentation

## 2014-07-19 DIAGNOSIS — K219 Gastro-esophageal reflux disease without esophagitis: Secondary | ICD-10-CM | POA: Insufficient documentation

## 2014-07-19 DIAGNOSIS — M797 Fibromyalgia: Secondary | ICD-10-CM | POA: Insufficient documentation

## 2014-07-19 DIAGNOSIS — Z7901 Long term (current) use of anticoagulants: Secondary | ICD-10-CM | POA: Insufficient documentation

## 2014-07-19 DIAGNOSIS — E785 Hyperlipidemia, unspecified: Secondary | ICD-10-CM | POA: Insufficient documentation

## 2014-07-19 DIAGNOSIS — Z951 Presence of aortocoronary bypass graft: Secondary | ICD-10-CM | POA: Insufficient documentation

## 2014-07-19 DIAGNOSIS — I6503 Occlusion and stenosis of bilateral vertebral arteries: Secondary | ICD-10-CM | POA: Diagnosis not present

## 2014-07-19 DIAGNOSIS — I771 Stricture of artery: Secondary | ICD-10-CM

## 2014-07-19 DIAGNOSIS — J439 Emphysema, unspecified: Secondary | ICD-10-CM | POA: Insufficient documentation

## 2014-07-19 DIAGNOSIS — I482 Chronic atrial fibrillation: Secondary | ICD-10-CM | POA: Insufficient documentation

## 2014-07-19 DIAGNOSIS — I5032 Chronic diastolic (congestive) heart failure: Secondary | ICD-10-CM | POA: Insufficient documentation

## 2014-07-19 DIAGNOSIS — F329 Major depressive disorder, single episode, unspecified: Secondary | ICD-10-CM | POA: Insufficient documentation

## 2014-07-19 HISTORY — PX: RADIOLOGY WITH ANESTHESIA: SHX6223

## 2014-07-19 LAB — APTT: APTT: 29 s (ref 24–37)

## 2014-07-19 LAB — PROTIME-INR
INR: 1.31 (ref 0.00–1.49)
Prothrombin Time: 16.4 seconds — ABNORMAL HIGH (ref 11.6–15.2)

## 2014-07-19 LAB — PLATELET INHIBITION P2Y12: Platelet Function  P2Y12: 179 [PRU] — ABNORMAL LOW (ref 194–418)

## 2014-07-19 SURGERY — RADIOLOGY WITH ANESTHESIA
Anesthesia: General

## 2014-07-19 MED ORDER — LACTATED RINGERS IV SOLN
INTRAVENOUS | Status: DC | PRN
  Administered 2014-07-19: 10:00:00 via INTRAVENOUS

## 2014-07-19 MED ORDER — LIDOCAINE HCL 1 % IJ SOLN
INTRAMUSCULAR | Status: AC
  Filled 2014-07-19: qty 20

## 2014-07-19 MED ORDER — CLOPIDOGREL BISULFATE 75 MG PO TABS
75.0000 mg | ORAL_TABLET | Freq: Once | ORAL | Status: AC
  Administered 2014-07-19: 75 mg via ORAL

## 2014-07-19 MED ORDER — LACTATED RINGERS IV SOLN
INTRAVENOUS | Status: DC | PRN
  Administered 2014-07-19: 08:00:00 via INTRAVENOUS

## 2014-07-19 MED ORDER — MIDAZOLAM HCL 5 MG/5ML IJ SOLN
INTRAMUSCULAR | Status: DC | PRN
  Administered 2014-07-19: 1 mg via INTRAVENOUS

## 2014-07-19 MED ORDER — PROPOFOL 10 MG/ML IV BOLUS
INTRAVENOUS | Status: DC | PRN
  Administered 2014-07-19: 20 mg via INTRAVENOUS

## 2014-07-19 MED ORDER — CLOPIDOGREL BISULFATE 75 MG PO TABS
ORAL_TABLET | ORAL | Status: AC
  Filled 2014-07-19: qty 1

## 2014-07-19 MED ORDER — NITROGLYCERIN 1 MG/10 ML FOR IR/CATH LAB
INTRA_ARTERIAL | Status: AC
  Filled 2014-07-19: qty 10

## 2014-07-19 MED ORDER — SODIUM CHLORIDE 0.9 % IV SOLN: Freq: Once | INTRAVENOUS | Status: DC

## 2014-07-19 MED ORDER — VANCOMYCIN HCL 1000 MG IV SOLR: 1000.0000 mg | Freq: Once | INTRAVENOUS | Status: DC

## 2014-07-19 MED ORDER — VANCOMYCIN HCL IN DEXTROSE 1-5 GM/200ML-% IV SOLN
1000.0000 mg | Freq: Once | INTRAVENOUS | Status: AC
  Administered 2014-07-19: 500 mg via INTRAVENOUS
  Filled 2014-07-19 (×2): qty 200

## 2014-07-19 MED ORDER — FUROSEMIDE 80 MG PO TABS
120.0000 mg | ORAL_TABLET | Freq: Once | ORAL | Status: AC
  Administered 2014-07-19: 120 mg via ORAL
  Filled 2014-07-19: qty 1

## 2014-07-19 NOTE — Anesthesia Postprocedure Evaluation (Signed)
  Anesthesia Post-op Note  Patient: Amber [name]  Procedure(s) Performed: Procedure(s) (LRB): ANGIOPLASTY (N/A)  Patient Location: PACU  Anesthesia Type: MAC  Level of Consciousness: awake and alert   Airway and Oxygen Therapy: Patient Spontanous Breathing  Post-op Pain: mild  Post-op Assessment: Post-op Vital signs reviewed, Patient's Cardiovascular Status Stable, Respiratory Function Stable, Patent Airway and No signs of Nausea or vomiting  Last Vitals:  Filed Vitals:   07/19/14 0729  BP: 135/99  Pulse: 59  Temp: 36.4 C  Resp: 18    Post-op Vital Signs: stable   Complications: No apparent anesthesia complications

## 2014-07-19 NOTE — H&P (Signed)
History of Present Illness: Amber [name] is a 80 y.o. female PMHx significant for CVA with residual left LE sided weakness, recent TIA with right sided weakness-follow Dr. Jannifer Franklin Neurology. CAD and chronic diastolic CHF follow with Dr. Mare Ferrari with Cardiology. She is s/p diagnostic cerebral arteriogram on 06/30/14 which revealed occluded right vertebral artery, worsening stenosis approximately 70-75% stenosis of left vertebral artery. She has been scheduled today with anesthesia to undergo left vertebral artery angioplasty with possible stenting. She denies any new neurological changes such as extremity weakness, speech, vision or hearing changes. She denies any chest pain, shortness of breath or palpitations. She denies any active signs of bleeding or excessive bruising. She denies any recent fever, chills or urinary symptoms. The patient denies any history of sleep apnea or chronic oxygen use. She has previously tolerated sedation without complications and denies any known complications to anesthesia. She has previously tolerated iodinated contrast.    Past Medical History  Diagnosis Date  . Coronary artery disease     a. s/p CABG;  b. 04/2010 Neg MV  . Hypertension   . Hyperlipidemia   . Hypothyroidism   . Depression   . Insomnia   . Fibromyalgia   . Gout   . Osteoarthritis   . Osteoporosis   . Cervical spinal stenosis   . Nasal polyposis   . Pneumonia     1990  . COPD with emphysema     PFT 05/02/10>>FEV1 1.35(62%), FEV1% 66, DLCO 75%  . Chronic diastolic CHF (congestive heart failure)     a. 02/2013 Echo: EF 55-60%, mild AI, mod dil LA.  [name] Atrial fibrillation     a. chronic/rate controlled;  b. chronic coumadin.  . CVA (cerebral vascular accident)     left sided weakness  . GERD (gastroesophageal reflux disease)     pepcid 2-3 times per week  . Intermittent confusion   . History of melanoma     squamous cell, melanoma  . Memory change 01/23/2014  . Dementia     Past  Surgical History  Procedure Laterality Date  . Abdominal hysterectomy    . Breast lumpectomy  1980s    Benign lesion - right  . Carpel tunnel      right  . Tonsillectomy    . Eye surgery      bilateral cataracts  . Coronary artery bypass graft  2000  . Radiology with anesthesia  12/24/2011    Procedure: RADIOLOGY WITH ANESTHESIA;  Surgeon: Medication Radiologist, MD;  Location: [facility];  Service: Radiology;  Laterality: N/A;  Extra Cranial Vascular Stent    Allergies: Ezetimibe; Welchol; Adhesive; Ceclor; Elastic bandages &; Latex; Lipitor; Mevacor; Pravachol; and Vasotec  Medications: Prior to Admission medications   Medication Sig Start Date End Date Taking? Authorizing Provider  acetaminophen (TYLENOL) 500 MG tablet Take 500 mg by mouth every 8 (eight) hours as needed for mild pain or moderate pain.   Yes Historical Provider, MD  alendronate (FOSAMAX) 70 MG tablet Take 70 mg by mouth every Sunday. Take with a full glass of water on an empty stomach.   Yes Historical Provider, MD  allopurinol (ZYLOPRIM) 300 MG tablet TAKE 1 TABLET EVERY DAY Patient taking differently: TAKE 1 TABLET every other day 09/27/13  Yes Darlin Coco, MD  amitriptyline (ELAVIL) 50 MG tablet Take 50 mg by mouth at bedtime.   Yes Historical Provider, MD  amLODipine (NORVASC) 5 MG tablet TAKE 1 TABLET (5 MG TOTAL) BY MOUTH EVERY EVENING. 06/28/14  Yes Darlin Coco, MD  Calcium-Vitamin D-Vitamin K (CALCIUM + D + K PO) Take 1 tablet by mouth daily.   Yes Historical Provider, MD  celecoxib (CELEBREX) 200 MG capsule Take 200 mg by mouth 2 (two) times daily.    Yes Historical Provider, MD  citalopram (CELEXA) 20 MG tablet TAKE 1 TABLET (20 MG TOTAL) BY MOUTH 2 (TWO) TIMES DAILY. 01/30/14  Yes Darlin Coco, MD  clopidogrel (PLAVIX) 75 MG tablet TAKE 1 TABLET BY MOUTH EVERY DAY 06/05/14  Yes Darlin Coco, MD  digoxin (LANOXIN) 0.125 MG tablet TAKE 1 TABLET EVERY DAY 06/09/14  Yes Darlin Coco, MD    donepezil (ARICEPT) 5 MG tablet Take 5 mg by mouth daily.   Yes Historical Provider, MD  enoxaparin (LOVENOX) 80 MG/0.8ML injection Inject 0.8 mLs (80 mg total) into the skin every 12 (twelve) hours. 07/14/14  Yes Darlin Coco, MD  ergocalciferol (VITAMIN D2) 50000 UNITS capsule Take 50,000 Units by mouth 2 (two) times a week. Sunday and Wednesday   Yes Historical Provider, MD  furosemide (LASIX) 40 MG tablet Take 40 mg by mouth at bedtime.    Yes Historical Provider, MD  furosemide (LASIX) 80 MG tablet TAKE 1 TABLET BY MOUTH TWICE A DAY 04/13/14  Yes Darlin Coco, MD  levothyroxine (SYNTHROID, LEVOTHROID) 25 MCG tablet Take 25 mcg by mouth daily.   Yes Historical Provider, MD  metoprolol succinate (TOPROL-XL) 25 MG 24 hr tablet TAKE 1 TABLET (25 MG TOTAL) BY MOUTH ONCE. 06/05/14  Yes Darlin Coco, MD  Multiple Vitamins-Minerals (MULTIVITAMIN WITH MINERALS) tablet Take 1 tablet by mouth daily.   Yes Historical Provider, MD  NAMENDA XR 28 MG CP24 24 hr capsule TAKE ONE CAPSULE BY MOUTH EVERY DAY 06/14/14  Yes Kathrynn Ducking, MD  nitroGLYCERIN (NITROSTAT) 0.4 MG SL tablet Place 1 tablet (0.4 mg total) under the tongue every 5 (five) minutes as needed for chest pain. 09/13/12  Yes Darlin Coco, MD  Polyethyl Glycol-Propyl Glycol (SYSTANE OP) Apply 1 drop to eye as needed (dry, red eyes).   Yes Historical Provider, MD  potassium chloride SA (KLOR-CON M20) 20 MEQ tablet Take 1 tablet (20 mEq total) by mouth 3 (three) times daily. Patient taking differently: Take 20-40 mEq by mouth See admin instructions. 2 Tablets in the morning, and 1 at bedtime 06/28/14  Yes Darlin Coco, MD  PROAIR HFA 108 [phone] BASE) MCG/ACT inhaler INHALE 2 PUFFS INTO LUNGS EVERY 6 HOURS AS NEEDED FOR WHEEZING 12/22/13  Yes Darlin Coco, MD  valsartan (DIOVAN) 40 MG tablet Take 1 tablet (40 mg total) by mouth 2 (two) times daily. 01/03/14  Yes Darlin Coco, MD  warfarin (COUMADIN) 5 MG tablet Take 1 tablet (5 mg  total) by mouth as directed. Take as directed by the [facility] Patient taking differently: Take 5-7.5 mg by mouth as directed. Take 5 mg on all days except  7.5 mg on Tues and Saturday. PT MUST TAKE COUMADIN ONLY 03/08/14  Yes Darlin Coco, MD     Family History  Problem Relation Age of Onset  . Stroke Mother   . CAD Father   . Diabetes type II Brother   . CAD Brother     History   Social History  . Marital Status: Married    Spouse Name: N/A  . Number of Children: 2  . Years of Education: N/A   Occupational History  . [employer]   . Secretary     Retired   Social History Main  Topics  . Smoking status: Former Smoker -- 1.50 packs/day for 25 years    Types: Cigarettes    Quit date: 02/10/1977  . Smokeless tobacco: Never Used  . Alcohol Use: No  . Drug Use: No  . Sexual Activity: Not on file   Other Topics Concern  . None   Social History Narrative   Patient is right handed.   Patient lives at home with husband   Patient drinks 8 oz caffeine about 3 days a week.   Review of Systems: A 12 point ROS discussed and pertinent positives are indicated in the HPI above.  All other systems are negative.  Review of Systems  Vital Signs: T: 97.30F, HR: 59 bpm, BP: 135/41mmHg, O2: 97% RA  Physical Exam  Constitutional: She is oriented to person, place, and time. No distress.  HENT:  Head: Normocephalic and atraumatic.  Neck: No tracheal deviation present.  Cardiovascular: Exam reveals no gallop and no friction rub.   No murmur heard. Irregular  Pulmonary/Chest: Effort normal and breath sounds normal. No respiratory distress. She has no wheezes. She has no rales.  Abdominal: Soft. Bowel sounds are normal. She exhibits no distension. There is no tenderness.  Neurological: She is alert and oriented to person, place, and time.  Speech clear, fine motor intact, no ataxia, smile symmetrical, tongue midline-able to move side to side, upper ext equal  strength 4/4, LLE slightly weaker with less strength during dorsiflexion or plantarflexion 4/5, RLE 5/5  Skin: Skin is warm and dry. She is not diaphoretic.  Psychiatric: She has a normal mood and affect. Her behavior is normal. Thought content normal.    Mallampati Score:  MD Evaluation Airway: WNL Heart: WNL Abdomen: WNL Chest/ Lungs: WNL ASA  Classification: 3 Mallampati/Airway Score: Two  Imaging: Dg Chest 2 View  07/17/2014   CLINICAL DATA:  80 year old female with a history of hypertension. Preoperative stenting.  EXAM: CHEST - 2 VIEW  COMPARISON:  Cerebral angiogram 06/30/2014, prior chest x-ray 02/17/2013, 02/14/2013, 02/12/2013  FINDINGS: Cardiomediastinal silhouette unchanged in size and contour.  Surgical changes of prior median sternotomy and CABG.  No evidence of pulmonary vascular congestion.  Lung volumes are adequate.  No pneumothorax.  No pleural effusion.  Prominent shadow of the anterior right and left sixth ribs.  No confluent airspace disease.  Compression fracture of lower thoracic/ upper lumbar vertebral bodies again demonstrated, similar to the comparison plain films.  Unremarkable appearance of the upper abdomen.  IMPRESSION: No radiographic evidence of acute cardiopulmonary disease.  Signed,  Dulcy Fanny. Earleen Newport, DO  [facility]  [facility]   Electronically Signed   By: Corrie Mckusick D.O.   On: 07/17/2014 15:47   Ir Angio Intra Extracran Sel Com Carotid Innominate Bilat Mod Sed  07/05/2014   CLINICAL DATA:  Vertebrobasilar insufficiency.  EXAM: BILATERAL COMMON CAROTID AND INNOMINATE ANGIOGRAPHY AND RIGHT SUBCLAVIAN ARTERY AND LEFT VERTEBRAL ARTERY ANGIOGRAM  PROCEDURE: Contrast: 60 mL OMNIPAQUE IOHEXOL 300 MG/ML  SOLN  Anesthesia/Sedation:  Conscious sedation.  Medications: Versed 1 mg IV.  Fentanyl 25 mcg IV.  Following a full explanation of the procedure along with the potential associated complications, an informed witnessed  consent was obtained.  The right groin was prepped and draped in the usual sterile fashion. Thereafter using modified Seldinger technique, transfemoral access into the right common femoral artery was obtained without difficulty. Over a 0.035 inch guidewire, a 5 French Pinnacle sheath was inserted. Through this, and also over 0.035 inch  guidewire, a 5 French JB1 catheter was advanced to the aortic arch region and selectively positioned in the right common carotid artery, the right subclavian artery, the left common carotid artery and the left vertebral artery.  There were no acute complications. The patient tolerated the procedure well.  FINDINGS: The right common carotid arteriogram demonstrates the right external carotid artery and its major branches to be widely patent.  The right internal carotid artery at the bulb demonstrates a smooth shallow plaque along the medial aspect without evidence of underlying stenosis or ulcerations.  The vessel is, otherwise, seen to opacify to the cranial skull base.  Mild FMD-like changes are seen in the mid cervical right ICA stable.  Distal to this the right cervical ICA is normal.  The petrous, cavernous and the supraclinoid segments are widely patent.  The right middle and the right anterior cerebral arteries are seen to opacify normally into the capillary and venous phases. Focal areas of mild caliber irregularity are seen involving the pericallosal and anterior perisylvian branches probably representing intracranial arteriosclerosis.  The right subclavian arteriogram demonstrates complete angiographic occlusion of the right vertebral artery at its origin.  The left common carotid arteriogram demonstrates approximately 15% narrowing of the left internal carotid artery just distal to the bulb secondary to a smooth calcified plaque by the NASCET criteria.  No ulcerations are seen.  The vessel is, otherwise, seen to opacify to the cranial skull base. Suggestion of mild FMD-like  changes noted in the mid cervical segment.  More distally the left internal carotid artery is seen to opacify normally to the cranial skull base.  The petrous and the proximal cavernous segments are widely patent.  There is approximately 50-60% narrowing of the left internal carotid artery supraclinoid segment.  The left middle and the left anterior cerebral arteries opacify into the capillary and venous phases. The dominant left vertebral artery origin demonstrates an approximately 70-75% narrowing. More distally the vessel is seen to opacify to the cranial skull base.  Mild FMD-like changes are also suggested in the distal horizontal segment of the left internal carotid artery at the level of C1.  Distal to this the vertebrobasilar junction and the left posterior inferior cerebellar arteries are seen to opacify normally.  The basilar artery itself demonstrates approximately 40% narrowing at its mid segment.  The posterior cerebral arteries, the superior cerebellar arteries and the anterior-inferior cerebellar arteries are seen to opacify into the capillary and venous phases.  Mild to moderate arteriosclerotic changes are seen in the left posterior cerebral artery P1 segment.  Also seen is retrograde opacification of the distal right vertebrobasilar junction.  Mild caliber irregularity is also noted of the right P2 segment of the PCA.  IMPRESSION: Interval progression of worsening stenoses of the dominant left vertebral artery at its origin with now a 70-75% narrowing.  Approximately 50% narrowing of the left internal carotid artery supraclinoid segment.  Approximately 15% narrowing of the left internal carotid artery just distal to the bulb.  Completely occluded right vertebral artery at its origin.  The angiographic findings were reviewed with the patient and the patient's daughter. Given the patient's symptoms despite being on the anti coagulation and on antiplatelets, and with the progression of the stenosis of  the left vertebral artery to 70-75% endovascular revascularization of the dominant left vertebral artery at its origin to augment intracranial flow was briefly discussed.  Both she and her daughter would like to proceed with the endovascular angioplasty mild with or without stenting  of the origin of the left vertebral artery as described. This will be be scheduled at the earliest possible.   Electronically Signed   By: Luanne Bras M.D.   On: 06/30/2014 14:15   Ir Angio Vertebral Sel Vertebral Uni L Mod Sed  07/05/2014   CLINICAL DATA:  Vertebrobasilar insufficiency.  EXAM: BILATERAL COMMON CAROTID AND INNOMINATE ANGIOGRAPHY AND RIGHT SUBCLAVIAN ARTERY AND LEFT VERTEBRAL ARTERY ANGIOGRAM  PROCEDURE: Contrast: 60 mL OMNIPAQUE IOHEXOL 300 MG/ML  SOLN  Anesthesia/Sedation:  Conscious sedation.  Medications: Versed 1 mg IV.  Fentanyl 25 mcg IV.  Following a full explanation of the procedure along with the potential associated complications, an informed witnessed consent was obtained.  The right groin was prepped and draped in the usual sterile fashion. Thereafter using modified Seldinger technique, transfemoral access into the right common femoral artery was obtained without difficulty. Over a 0.035 inch guidewire, a 5 French Pinnacle sheath was inserted. Through this, and also over 0.035 inch guidewire, a 5 French JB1 catheter was advanced to the aortic arch region and selectively positioned in the right common carotid artery, the right subclavian artery, the left common carotid artery and the left vertebral artery.  There were no acute complications. The patient tolerated the procedure well.  FINDINGS: The right common carotid arteriogram demonstrates the right external carotid artery and its major branches to be widely patent.  The right internal carotid artery at the bulb demonstrates a smooth shallow plaque along the medial aspect without evidence of underlying stenosis or ulcerations.  The vessel is,  otherwise, seen to opacify to the cranial skull base.  Mild FMD-like changes are seen in the mid cervical right ICA stable.  Distal to this the right cervical ICA is normal.  The petrous, cavernous and the supraclinoid segments are widely patent.  The right middle and the right anterior cerebral arteries are seen to opacify normally into the capillary and venous phases. Focal areas of mild caliber irregularity are seen involving the pericallosal and anterior perisylvian branches probably representing intracranial arteriosclerosis.  The right subclavian arteriogram demonstrates complete angiographic occlusion of the right vertebral artery at its origin.  The left common carotid arteriogram demonstrates approximately 15% narrowing of the left internal carotid artery just distal to the bulb secondary to a smooth calcified plaque by the NASCET criteria.  No ulcerations are seen.  The vessel is, otherwise, seen to opacify to the cranial skull base. Suggestion of mild FMD-like changes noted in the mid cervical segment.  More distally the left internal carotid artery is seen to opacify normally to the cranial skull base.  The petrous and the proximal cavernous segments are widely patent.  There is approximately 50-60% narrowing of the left internal carotid artery supraclinoid segment.  The left middle and the left anterior cerebral arteries opacify into the capillary and venous phases. The dominant left vertebral artery origin demonstrates an approximately 70-75% narrowing. More distally the vessel is seen to opacify to the cranial skull base.  Mild FMD-like changes are also suggested in the distal horizontal segment of the left internal carotid artery at the level of C1.  Distal to this the vertebrobasilar junction and the left posterior inferior cerebellar arteries are seen to opacify normally.  The basilar artery itself demonstrates approximately 40% narrowing at its mid segment.  The posterior cerebral arteries, the  superior cerebellar arteries and the anterior-inferior cerebellar arteries are seen to opacify into the capillary and venous phases.  Mild to moderate arteriosclerotic changes are seen in  the left posterior cerebral artery P1 segment.  Also seen is retrograde opacification of the distal right vertebrobasilar junction.  Mild caliber irregularity is also noted of the right P2 segment of the PCA.  IMPRESSION: Interval progression of worsening stenoses of the dominant left vertebral artery at its origin with now a 70-75% narrowing.  Approximately 50% narrowing of the left internal carotid artery supraclinoid segment.  Approximately 15% narrowing of the left internal carotid artery just distal to the bulb.  Completely occluded right vertebral artery at its origin.  The angiographic findings were reviewed with the patient and the patient's daughter. Given the patient's symptoms despite being on the anti coagulation and on antiplatelets, and with the progression of the stenosis of the left vertebral artery to 70-75% endovascular revascularization of the dominant left vertebral artery at its origin to augment intracranial flow was briefly discussed.  Both she and her daughter would like to proceed with the endovascular angioplasty mild with or without stenting of the origin of the left vertebral artery as described. This will be be scheduled at the earliest possible.   Electronically Signed   By: Luanne Bras M.D.   On: 06/30/2014 14:15    Labs:  CBC:  Recent Labs  02/18/14 1515 06/30/14 1000 07/17/14 1447  WBC 6.4 6.6 6.6  HGB 14.1 14.7 14.0  HCT 42.8 43.4 41.6  PLT 249 230 233    COAGS:  Recent Labs  02/18/14 1515  06/23/14 1300 06/30/14 1000 07/04/14 1526 07/07/14 1151  INR 2.03*  < > 2.5 1.25 1.7 2.1  APTT 39*  --   --   --   --   --   < > = values in this interval not displayed.  BMP:  Recent Labs  02/18/14 1515 03/24/14 1542 06/30/14 1054 07/17/14 1447  NA 137 141 140 137    K 3.3* 3.4* 3.9 4.0  CL 101 102 101 102  CO2 28 35* 29 25  GLUCOSE 148* 79 126* 175*  BUN 9 15 9 20   CALCIUM 9.4 9.0 9.7 9.0  CREATININE 0.72 0.94 0.80 0.98  GFRNONAA 81*  --  >60 54*  GFRAA >90  --  >60 >60    LIVER FUNCTION TESTS:  Recent Labs  09/21/13 1508 01/03/14 1550 02/18/14 1515 07/17/14 1447  BILITOT 0.5 0.8 0.7 0.7  AST 38* 49* 44* 46*  ALT 31 32 27 26  ALKPHOS 45 49 47 42  PROT 7.6 7.1 6.5 6.1*  ALBUMIN 4.6 4.5 4.0 4.0   Assessment and Plan: CVA, residual left sided weakness  Recent TIA with right sided weakness-follow Dr. Jannifer Franklin Neurology  S/p diagnostic cerebral arteriogram 06/30/14 Occluded right vertebral artery, Approximately 70-75% stenosis of left vertebral artery On coumadin, plavix  Scheduled today for left vertebral artery angioplasty with possible stenting under general anesthesia  The patient has been NPO, Coumadin stopped Saturday evening 6/4 bridged on Lovenox-LD yesterday 11:00am Q12 hrs dosing, labs and vitals have been reviewed. Risks and Benefits discussed with the patient including, but not limited to bleeding, infection, vascular injury, contrast induced renal failure, stroke or even death. All of the patient's questions were answered, patient is agreeable to proceed. Consent signed and in chart. Perm Afib, rate controlled on coumadin CAD s/p CABG, follows Dr. Mare Ferrari Cardiology COPD Chronic diastolic CHF EF 86-76%    Signed: Hedy Jacob 07/19/2014, 8:14 AM

## 2014-07-19 NOTE — Progress Notes (Signed)
Per IR nurse pt went home at approx. [place]

## 2014-07-19 NOTE — Transfer of Care (Signed)
Immediate Anesthesia Transfer of Care Note  Patient: Amber [name]  Procedure(s) Performed: Procedure(s): ANGIOPLASTY (N/A)  Patient Location: IR  Anesthesia Type:MAC  Level of Consciousness: awake, alert  and oriented  Airway & Oxygen Therapy: Patient Spontanous Breathing  Post-op Assessment: Report given to RN and Post -op Vital signs reviewed and stable (report given to IR nurse in room). All questions answered.  Post vital signs: Reviewed and stable  Last Vitals:  Filed Vitals:   07/19/14 0729  BP: 135/99  Pulse: 59  Temp: 36.4 C  Resp: 18    Complications: No apparent anesthesia complications

## 2014-07-19 NOTE — Anesthesia Preprocedure Evaluation (Addendum)
Anesthesia Evaluation  Patient identified by MRN, date of birth, ID band Patient awake    Reviewed: Allergy & Precautions, NPO status , Patient's Chart, lab work & pertinent test results  Airway Mallampati: II  TM Distance: >3 FB Neck ROM: Full    Dental no notable dental hx. (+) Edentulous Upper, Dental Advisory Given   Pulmonary COPDformer smoker,  breath sounds clear to auscultation  Pulmonary exam normal       Cardiovascular hypertension, Pt. on medications + CAD, + CABG, + Peripheral Vascular Disease and +CHF + dysrhythmias Atrial Fibrillation Rhythm:Irregular Rate:Normal     Neuro/Psych CVA negative psych ROS   GI/Hepatic negative GI ROS, Neg liver ROS,   Endo/Other  Hypothyroidism   Renal/GU negative Renal ROS  negative genitourinary   Musculoskeletal negative musculoskeletal ROS (+)   Abdominal   Peds negative pediatric ROS (+)  Hematology negative hematology ROS (+)   Anesthesia Other Findings   Reproductive/Obstetrics negative OB ROS                           Anesthesia Physical Anesthesia Plan  ASA: III  Anesthesia Plan: MAC   Post-op Pain Management:    Induction: Intravenous  Airway Management Planned: Simple Face Mask  Additional Equipment:   Intra-op Plan:   Post-operative Plan:   Informed Consent: I have reviewed the patients History and Physical, chart, labs and discussed the procedure including the risks, benefits and alternatives for the proposed anesthesia with the patient or authorized representative who has indicated his/her understanding and acceptance.   Dental advisory given  Plan Discussed with: CRNA and Surgeon  Anesthesia Plan Comments:        Anesthesia Quick Evaluation

## 2014-07-19 NOTE — Progress Notes (Signed)
Patient ID: Amber [name], female   DOB: 06-17-1934, 80 y.o.   MRN: [id number] Patient brought to the lab for the procedure. However close examination revealed the presence of 3 firm tender masses most likely representingh hematomas  related to the lovenox injections.Associated sig bruising and ecchymosis noted. Given the potential possibility of these worsening due to IV heparin during and after the procedure, the procedure is being postponed foe a few days . D/W patient and family.. Will reschedule. Dr Estanislado Pandy MD

## 2014-07-19 NOTE — Sedation Documentation (Signed)
PT has large hematoma on R lower abdomen from lovenox injections. Per Dr Bronson Curb we will not proceed with intervention. He has talked with her daughter [name]. Anesthesia present for case, Dr Kalman Shan notified of cancellation. Pt had 20mg  propofol and 1mg  versed. OK with DrDeveswhar and anesthesia to discharge from IR. Vitals stable, pt alert and oriented x3.

## 2014-07-20 ENCOUNTER — Other Ambulatory Visit: Payer: Medicare Other

## 2014-07-20 ENCOUNTER — Encounter (HOSPITAL_COMMUNITY): Payer: Self-pay | Admitting: Interventional Radiology

## 2014-07-20 ENCOUNTER — Ambulatory Visit: Payer: Medicare Other | Admitting: Cardiology

## 2014-07-22 ENCOUNTER — Other Ambulatory Visit: Payer: Self-pay | Admitting: Cardiology

## 2014-07-24 ENCOUNTER — Encounter: Payer: Self-pay | Admitting: *Deleted

## 2014-07-24 NOTE — Telephone Encounter (Signed)
This encounter was created in error - please disregard.

## 2014-07-26 ENCOUNTER — Encounter (HOSPITAL_COMMUNITY): Payer: Self-pay | Admitting: *Deleted

## 2014-07-26 ENCOUNTER — Ambulatory Visit: Payer: Medicare Other | Admitting: Neurology

## 2014-07-26 ENCOUNTER — Other Ambulatory Visit: Payer: Self-pay | Admitting: Radiology

## 2014-07-26 NOTE — Progress Notes (Signed)
Pt denies SOB and chest pain but is under the care of Dr. Mare Ferrari, cardiology. Pt made aware to stop taking NSAID's, otc vitamins and herbal medications. Spoke with Monia Sabal, PA to make MD aware that pt stated that she has open areas to the B/LLE due to scratching; no new orders given, pt to be assessed DOS. Pt denies signs and symptoms of infection ( fever, pain, swelling, drainage and etc.). Pt stated that she will clean and cover the affected areas with bandages. Spoke with Ebony Hail, [place],  anesthesia regarding pt history.

## 2014-07-26 NOTE — Progress Notes (Signed)
Anesthesia follow-up:  See my detailed note including PAT evaluation from 07/17/14.  Left vertebral artery angioplasty was scheduled for 07/19/14, but Dr. Estanislado Pandy noted three areas concerning for hematomas thought to be associated with Lovenox injections.  Given the potential for these worsening with IV heparin, he decided to postpone the procedure for a few more days.  Procedure is now scheduled for 07/27/14 as a SAME DAY WORK-UP.  If no acute changes then I would anticipate that she could proceed as planned from an anesthesia standpoint.  George Hugh [facility] Short Stay Center/Anesthesiology Phone [phone] 07/26/2014 12:58 PM

## 2014-07-27 ENCOUNTER — Encounter (HOSPITAL_COMMUNITY): Payer: Self-pay | Admitting: *Deleted

## 2014-07-27 ENCOUNTER — Encounter (HOSPITAL_COMMUNITY): Payer: Self-pay

## 2014-07-27 ENCOUNTER — Encounter (HOSPITAL_COMMUNITY): Payer: Self-pay | Admitting: Anesthesiology

## 2014-07-27 ENCOUNTER — Ambulatory Visit (HOSPITAL_COMMUNITY): Payer: Medicare Other | Admitting: Vascular Surgery

## 2014-07-27 ENCOUNTER — Ambulatory Visit (HOSPITAL_COMMUNITY)
Admission: RE | Admit: 2014-07-27 | Payer: Medicare Other | Source: Ambulatory Visit | Admitting: Interventional Radiology

## 2014-07-27 ENCOUNTER — Ambulatory Visit (HOSPITAL_COMMUNITY)
Admit: 2014-07-27 | Discharge: 2014-07-27 | Disposition: A | Discharge status: Home / Self Care | Payer: Medicare Other | Source: Ambulatory Visit | Attending: Interventional Radiology | Admitting: Interventional Radiology

## 2014-07-27 ENCOUNTER — Encounter (HOSPITAL_COMMUNITY)
Admission: RE | Disposition: A | Discharge status: Home / Self Care | Payer: Self-pay | Source: Ambulatory Visit | Attending: Interventional Radiology

## 2014-07-27 DIAGNOSIS — Z8249 Family history of ischemic heart disease and other diseases of the circulatory system: Secondary | ICD-10-CM | POA: Insufficient documentation

## 2014-07-27 DIAGNOSIS — E039 Hypothyroidism, unspecified: Secondary | ICD-10-CM | POA: Diagnosis not present

## 2014-07-27 DIAGNOSIS — J439 Emphysema, unspecified: Secondary | ICD-10-CM | POA: Insufficient documentation

## 2014-07-27 DIAGNOSIS — Z7901 Long term (current) use of anticoagulants: Secondary | ICD-10-CM | POA: Diagnosis not present

## 2014-07-27 DIAGNOSIS — I4891 Unspecified atrial fibrillation: Secondary | ICD-10-CM | POA: Insufficient documentation

## 2014-07-27 DIAGNOSIS — I5032 Chronic diastolic (congestive) heart failure: Secondary | ICD-10-CM | POA: Insufficient documentation

## 2014-07-27 DIAGNOSIS — I2510 Atherosclerotic heart disease of native coronary artery without angina pectoris: Secondary | ICD-10-CM | POA: Insufficient documentation

## 2014-07-27 DIAGNOSIS — M810 Age-related osteoporosis without current pathological fracture: Secondary | ICD-10-CM | POA: Diagnosis not present

## 2014-07-27 DIAGNOSIS — F0390 Unspecified dementia without behavioral disturbance: Secondary | ICD-10-CM | POA: Diagnosis not present

## 2014-07-27 DIAGNOSIS — I6502 Occlusion and stenosis of left vertebral artery: Secondary | ICD-10-CM | POA: Insufficient documentation

## 2014-07-27 DIAGNOSIS — R42 Dizziness and giddiness: Secondary | ICD-10-CM | POA: Diagnosis not present

## 2014-07-27 DIAGNOSIS — E785 Hyperlipidemia, unspecified: Secondary | ICD-10-CM | POA: Insufficient documentation

## 2014-07-27 DIAGNOSIS — G4700 Insomnia, unspecified: Secondary | ICD-10-CM | POA: Diagnosis not present

## 2014-07-27 DIAGNOSIS — K219 Gastro-esophageal reflux disease without esophagitis: Secondary | ICD-10-CM | POA: Diagnosis not present

## 2014-07-27 DIAGNOSIS — Z823 Family history of stroke: Secondary | ICD-10-CM | POA: Insufficient documentation

## 2014-07-27 DIAGNOSIS — Z8673 Personal history of transient ischemic attack (TIA), and cerebral infarction without residual deficits: Secondary | ICD-10-CM | POA: Diagnosis not present

## 2014-07-27 DIAGNOSIS — I10 Essential (primary) hypertension: Secondary | ICD-10-CM | POA: Insufficient documentation

## 2014-07-27 DIAGNOSIS — I771 Stricture of artery: Secondary | ICD-10-CM | POA: Insufficient documentation

## 2014-07-27 DIAGNOSIS — Z951 Presence of aortocoronary bypass graft: Secondary | ICD-10-CM | POA: Diagnosis not present

## 2014-07-27 DIAGNOSIS — Z79899 Other long term (current) drug therapy: Secondary | ICD-10-CM | POA: Diagnosis not present

## 2014-07-27 DIAGNOSIS — Z7902 Long term (current) use of antithrombotics/antiplatelets: Secondary | ICD-10-CM | POA: Insufficient documentation

## 2014-07-27 DIAGNOSIS — Z833 Family history of diabetes mellitus: Secondary | ICD-10-CM | POA: Insufficient documentation

## 2014-07-27 DIAGNOSIS — Z87891 Personal history of nicotine dependence: Secondary | ICD-10-CM | POA: Diagnosis not present

## 2014-07-27 HISTORY — PX: RADIOLOGY WITH ANESTHESIA: SHX6223

## 2014-07-27 LAB — COMPREHENSIVE METABOLIC PANEL
ALK PHOS: 38 U/L (ref 38–126)
ALT: 43 U/L (ref 14–54)
AST: 50 U/L — AB (ref 15–41)
Albumin: 4.2 g/dL (ref 3.5–5.0)
Anion gap: 9 (ref 5–15)
BUN: 16 mg/dL (ref 6–20)
CALCIUM: 9.3 mg/dL (ref 8.9–10.3)
CO2: 27 mmol/L (ref 22–32)
Chloride: 104 mmol/L (ref 101–111)
Creatinine, Ser: 0.99 mg/dL (ref 0.44–1.00)
GFR calc non Af Amer: 54 mL/min — ABNORMAL LOW (ref >60)
GLUCOSE: 114 mg/dL — AB (ref 65–99)
POTASSIUM: 4.1 mmol/L (ref 3.5–5.1)
SODIUM: 140 mmol/L (ref 135–145)
Total Bilirubin: 1 mg/dL (ref 0.3–1.2)
Total Protein: 6.6 g/dL (ref 6.5–8.1)

## 2014-07-27 LAB — CBC WITH DIFFERENTIAL/PLATELET
Basophils Absolute: 0 10*3/uL (ref 0.0–0.1)
Basophils Relative: 0 % (ref 0–1)
EOS ABS: 0.1 10*3/uL (ref 0.0–0.7)
EOS PCT: 2 % (ref 0–5)
HCT: 42.2 % (ref 36.0–46.0)
Hemoglobin: 14 g/dL (ref 12.0–15.0)
Lymphocytes Relative: 25 % (ref 12–46)
Lymphs Abs: 1.9 10*3/uL (ref 0.7–4.0)
MCH: 30.8 pg (ref 26.0–34.0)
MCHC: 33.2 g/dL (ref 30.0–36.0)
MCV: 92.7 fL (ref 78.0–100.0)
Monocytes Absolute: 0.7 10*3/uL (ref 0.1–1.0)
Monocytes Relative: 10 % (ref 3–12)
Neutro Abs: 4.6 10*3/uL (ref 1.7–7.7)
Neutrophils Relative %: 63 % (ref 43–77)
Platelets: 209 10*3/uL (ref 150–400)
RBC: 4.55 MIL/uL (ref 3.87–5.11)
RDW: 14.4 % (ref 11.5–15.5)
WBC: 7.3 10*3/uL (ref 4.0–10.5)

## 2014-07-27 LAB — PROTIME-INR
INR: 1.04 (ref 0.00–1.49)
Prothrombin Time: 13.8 seconds (ref 11.6–15.2)

## 2014-07-27 LAB — APTT: aPTT: 28 seconds (ref 24–37)

## 2014-07-27 LAB — PLATELET INHIBITION P2Y12: Platelet Function  P2Y12: 170 [PRU] — ABNORMAL LOW (ref 194–418)

## 2014-07-27 SURGERY — RADIOLOGY WITH ANESTHESIA
Anesthesia: Monitor Anesthesia Care

## 2014-07-27 MED ORDER — PROMETHAZINE HCL 25 MG/ML IJ SOLN: 6.2500 mg | INTRAMUSCULAR | Status: DC | PRN

## 2014-07-27 MED ORDER — NITROGLYCERIN 1 MG/10 ML FOR IR/CATH LAB
INTRA_ARTERIAL | Status: AC
  Filled 2014-07-27: qty 10

## 2014-07-27 MED ORDER — SODIUM CHLORIDE 0.9 % IV SOLN: Freq: Once | INTRAVENOUS | Status: DC

## 2014-07-27 MED ORDER — ASPIRIN 81 MG PO CHEW
CHEWABLE_TABLET | ORAL | Status: AC
  Filled 2014-07-27: qty 4

## 2014-07-27 MED ORDER — VANCOMYCIN HCL IN DEXTROSE 1-5 GM/200ML-% IV SOLN
INTRAVENOUS | Status: AC
  Filled 2014-07-27: qty 200

## 2014-07-27 MED ORDER — SODIUM CHLORIDE 0.9 % IV SOLN: INTRAVENOUS | Status: AC

## 2014-07-27 MED ORDER — FENTANYL CITRATE (PF) 100 MCG/2ML IJ SOLN
INTRAMUSCULAR | Status: AC
  Filled 2014-07-27: qty 2

## 2014-07-27 MED ORDER — ASPIRIN EC 325 MG PO TBEC
325.0000 mg | DELAYED_RELEASE_TABLET | Freq: Once | ORAL | Status: AC
  Administered 2014-07-27: 325 mg via ORAL
  Filled 2014-07-27: qty 1

## 2014-07-27 MED ORDER — LACTATED RINGERS IV SOLN
INTRAVENOUS | Status: DC | PRN
  Administered 2014-07-27: 12:00:00 via INTRAVENOUS

## 2014-07-27 MED ORDER — IOHEXOL 300 MG/ML  SOLN
150.0000 mL | Freq: Once | INTRAMUSCULAR | Status: AC | PRN
  Administered 2014-07-27: 30 mL via INTRAVENOUS

## 2014-07-27 MED ORDER — FENTANYL CITRATE (PF) 100 MCG/2ML IJ SOLN
25.0000 ug | INTRAMUSCULAR | Status: DC | PRN
  Administered 2014-07-27 (×2): 25 ug via INTRAVENOUS

## 2014-07-27 MED ORDER — HYDRALAZINE HCL 20 MG/ML IJ SOLN
INTRAMUSCULAR | Status: DC | PRN
  Administered 2014-07-27 (×3): 5 mg via INTRAVENOUS

## 2014-07-27 MED ORDER — NIMODIPINE 30 MG PO CAPS: 0.0000 mg | ORAL_CAPSULE | ORAL | Status: DC

## 2014-07-27 MED ORDER — VANCOMYCIN HCL 1000 MG IV SOLR: 1000.0000 mg | Freq: Once | INTRAVENOUS | Status: DC

## 2014-07-27 MED ORDER — CLOPIDOGREL BISULFATE 75 MG PO TABS
75.0000 mg | ORAL_TABLET | Freq: Once | ORAL | Status: DC
  Filled 2014-07-27: qty 1

## 2014-07-27 MED ORDER — FENTANYL CITRATE (PF) 100 MCG/2ML IJ SOLN
INTRAMUSCULAR | Status: DC | PRN
  Administered 2014-07-27 (×2): 50 ug via INTRAVENOUS

## 2014-07-27 MED ORDER — MIDAZOLAM HCL 5 MG/5ML IJ SOLN
INTRAMUSCULAR | Status: DC | PRN
  Administered 2014-07-27: 0.5 mg via INTRAVENOUS

## 2014-07-27 NOTE — Discharge Instructions (Signed)

## 2014-07-27 NOTE — H&P (Signed)
Chief Complaint: Weakness, dizziness  Referring Physician(s):  Dr Robyn Haber  History of Present Illness: Amber [name] is a 80 y.o. female   Hx CVA Afib CAD Onset worsening dizziness Cerebral arteriogram 5/20 revealed occluded Rt Vertebral artery and stenosis of L VA Now scheduled for angioplasty possible stent of same Off coumadin for several days now Lovenox daily Last dose yesterday  Past Medical History  Diagnosis Date  . Coronary artery disease     a. s/p CABG;  b. 04/2010 Neg MV  . Hypertension   . Hyperlipidemia   . Hypothyroidism   . Depression   . Insomnia   . Fibromyalgia   . Gout   . Osteoarthritis   . Osteoporosis   . Cervical spinal stenosis   . Nasal polyposis   . Pneumonia     1990  . COPD with emphysema     PFT 05/02/10>>FEV1 1.35(62%), FEV1% 66, DLCO 75%  . Chronic diastolic CHF (congestive heart failure)     a. 02/2013 Echo: EF 55-60%, mild AI, mod dil LA.  [name] Atrial fibrillation     a. chronic/rate controlled;  b. chronic coumadin.  . CVA (cerebral vascular accident)     left sided weakness  . GERD (gastroesophageal reflux disease)     pepcid 2-3 times per week  . Intermittent confusion   . History of melanoma     squamous cell, melanoma  . Memory change 01/23/2014  . Dementia     Past Surgical History  Procedure Laterality Date  . Abdominal hysterectomy    . Breast lumpectomy  1980s    Benign lesion - right  . Carpel tunnel      right  . Tonsillectomy    . Eye surgery      bilateral cataracts  . Coronary artery bypass graft  2000  . Radiology with anesthesia  12/24/2011    Procedure: RADIOLOGY WITH ANESTHESIA;  Surgeon: Medication Radiologist, MD;  Location: [place];  Service: Radiology;  Laterality: N/A;  Extra Cranial Vascular Stent  . Radiology with anesthesia N/A 07/19/2014    Procedure: ANGIOPLASTY;  Surgeon: Luanne Bras, MD;  Location: [place];  Service: Radiology;  Laterality: N/A;    Allergies: Ezetimibe;  Welchol; Adhesive; Ceclor; Elastic bandages &; Latex; Lipitor; Mevacor; Pravachol; and Vasotec  Medications: Prior to Admission medications   Medication Sig Start Date End Date Taking? Authorizing Provider  acetaminophen (TYLENOL) 500 MG tablet Take 500 mg by mouth every 8 (eight) hours as needed for mild pain or moderate pain.    Historical Provider, MD  alendronate (FOSAMAX) 70 MG tablet Take 70 mg by mouth every Sunday. Take with a full glass of water on an empty stomach.    Historical Provider, MD  allopurinol (ZYLOPRIM) 300 MG tablet TAKE 1 TABLET EVERY DAY 07/24/14   Darlin Coco, MD  amitriptyline (ELAVIL) 50 MG tablet Take 50 mg by mouth at bedtime.    Historical Provider, MD  amLODipine (NORVASC) 5 MG tablet TAKE 1 TABLET (5 MG TOTAL) BY MOUTH EVERY EVENING. Patient taking differently: TAKE 1 TABLET (5 MG TOTAL) BY MOUTH DAILY AT BEDTIME 06/28/14   Darlin Coco, MD  Calcium Carbonate-Vitamin D (CALCIUM + D PO) Take 1 tablet by mouth daily.    Historical Provider, MD  celecoxib (CELEBREX) 200 MG capsule Take 200 mg by mouth 2 (two) times daily.     Historical Provider, MD  citalopram (CELEXA) 20 MG tablet TAKE 1 TABLET (20 MG TOTAL) BY MOUTH 2 (  TWO) TIMES DAILY. 01/30/14   Darlin Coco, MD  clopidogrel (PLAVIX) 75 MG tablet TAKE 1 TABLET BY MOUTH EVERY DAY 06/05/14   Darlin Coco, MD  digoxin (LANOXIN) 0.125 MG tablet TAKE 1 TABLET EVERY DAY Patient taking differently: TAKE 1 TABLET DAILY AT BEDTIME 06/09/14   Darlin Coco, MD  donepezil (ARICEPT) 5 MG tablet Take 5 mg by mouth daily.    Historical Provider, MD  enoxaparin (LOVENOX) 80 MG/0.8ML injection Inject 0.8 mLs (80 mg total) into the skin every 12 (twelve) hours. 07/14/14   Darlin Coco, MD  ergocalciferol (VITAMIN D2) 50000 UNITS capsule Take 50,000 Units by mouth 2 (two) times a week. Sunday and Wednesday    Historical Provider, MD  furosemide (LASIX) 40 MG tablet Take 40 mg by mouth daily. Take with an 80 mg  tablet every morning for a 120 mg dose    Historical Provider, MD  furosemide (LASIX) 80 MG tablet TAKE 1 TABLET BY MOUTH TWICE A DAY 04/13/14   Darlin Coco, MD  levothyroxine (SYNTHROID, LEVOTHROID) 25 MCG tablet Take 25 mcg by mouth daily.    Historical Provider, MD  metoprolol succinate (TOPROL-XL) 25 MG 24 hr tablet TAKE 1 TABLET (25 MG TOTAL) BY MOUTH ONCE. Patient taking differently: TAKE 1 TABLET (25 MG TOTAL) BY MOUTH ONCE DAILY 06/05/14   Darlin Coco, MD  Multiple Vitamins-Minerals (MULTIVITAMIN WITH MINERALS) tablet Take 1 tablet by mouth daily.    Historical Provider, MD  NAMENDA XR 28 MG CP24 24 hr capsule TAKE ONE CAPSULE BY MOUTH EVERY DAY 06/14/14   Kathrynn Ducking, MD  nitroGLYCERIN (NITROSTAT) 0.4 MG SL tablet Place 1 tablet (0.4 mg total) under the tongue every 5 (five) minutes as needed for chest pain. 09/13/12   Darlin Coco, MD  Polyethyl Glycol-Propyl Glycol (SYSTANE OP) Place 1 drop into both eyes daily.     Historical Provider, MD  potassium chloride SA (KLOR-CON M20) 20 MEQ tablet Take 1 tablet (20 mEq total) by mouth 3 (three) times daily. 06/28/14   Darlin Coco, MD  PROAIR HFA 108 (90 BASE) MCG/ACT inhaler INHALE 2 PUFFS INTO LUNGS EVERY 6 HOURS AS NEEDED FOR WHEEZING 12/22/13   Darlin Coco, MD  valsartan (DIOVAN) 40 MG tablet Take 1 tablet (40 mg total) by mouth 2 (two) times daily. 01/03/14   Darlin Coco, MD  warfarin (COUMADIN) 5 MG tablet Take 1 tablet (5 mg total) by mouth as directed. Take as directed by the Cou[facility] Patient taking differently: Take 5-7.5 mg by mouth at bedtime. Take 1 tablet (5 mg) on Sunday, Monday, Wednesday, Thursday, Friday; take 1 1/2 tablets (7.5 mg) on Tuesday and Saturday or as directed by coumadin clinic.  PT MUST TAKE NAME BRAND COUMADIN ONLY 03/08/14   Darlin Coco, MD     Family History  Problem Relation Age of Onset  . Stroke Mother   . CAD Father   . Diabetes type II Brother   . CAD Brother      History   Social History  . Marital Status: Married    Spouse Name: N/A  . Number of Children: 2  . Years of Education: N/A   Occupational History  . [employer]   . Secretary     Retired   Social History Main Topics  . Smoking status: Former Smoker -- 1.50 packs/day for 25 years    Types: Cigarettes    Quit date: 02/10/1977  . Smokeless tobacco: Never Used  . Alcohol Use: No  .  Drug Use: No  . Sexual Activity: Not on file   Other Topics Concern  . None   Social History Narrative   Patient is right handed.   Patient lives at home with husband   Patient drinks 8 oz caffeine about 3 days a week.      Review of Systems: A 12 point ROS discussed and pertinent positives are indicated in the HPI above.  All other systems are negative.  Review of Systems  Constitutional: Positive for activity change. Negative for fever and appetite change.  HENT: Negative for tinnitus and trouble swallowing.   Eyes: Negative for visual disturbance.  Respiratory: Negative for cough and shortness of breath.   Cardiovascular: Negative for chest pain.  Gastrointestinal: Negative for nausea, vomiting and abdominal pain.  Genitourinary: Negative for difficulty urinating.  Musculoskeletal: Negative for back pain.  Neurological: Positive for dizziness, weakness and light-headedness. Negative for tremors, seizures, syncope, facial asymmetry, speech difficulty, numbness and headaches.  Psychiatric/Behavioral: Negative for behavioral problems and confusion.    Vital Signs: BP 162/53 mmHg  Pulse 61  Temp(Src) 97.6 F (36.4 C)  Resp 20  Ht 5\' 4"  (1.626 m)  Wt 170 lb (77.111 kg)  BMI 29.17 kg/m2  SpO2 96%  Physical Exam  Constitutional: She is oriented to person, place, and time. She appears well-nourished.  Eyes: EOM are normal.  Neck: Neck supple.  Cardiovascular:  No murmur heard. Irreg rate  Pulmonary/Chest: Effort normal and breath sounds normal. She has no  wheezes.  Abdominal: Soft. Bowel sounds are normal. There is no tenderness.  Noted bruising from Lovenox shots  Musculoskeletal: Normal range of motion. She exhibits no tenderness.  Neurological: She is alert and oriented to person, place, and time.  Skin: Skin is warm and dry.  Psychiatric: She has a normal mood and affect. Her behavior is normal. Judgment and thought content normal.  Nursing note and vitals reviewed.   Mallampati Score:  MD Evaluation Airway: WNL Heart: WNL Abdomen: WNL Chest/ Lungs: WNL ASA  Classification: 3 Mallampati/Airway Score: Two  Imaging: Dg Chest 2 View  07/17/2014   CLINICAL DATA:  81 year old female with a history of hypertension. Preoperative stenting.  EXAM: CHEST - 2 VIEW  COMPARISON:  Cerebral angiogram 06/30/2014, prior chest x-ray 02/17/2013, 02/14/2013, 02/12/2013  FINDINGS: Cardiomediastinal silhouette unchanged in size and contour.  Surgical changes of prior median sternotomy and CABG.  No evidence of pulmonary vascular congestion.  Lung volumes are adequate.  No pneumothorax.  No pleural effusion.  Prominent shadow of the anterior right and left sixth ribs.  No confluent airspace disease.  Compression fracture of lower thoracic/ upper lumbar vertebral bodies again demonstrated, similar to the comparison plain films.  Unremarkable appearance of the upper abdomen.  IMPRESSION: No radiographic evidence of acute cardiopulmonary disease.  Signed,  Dulcy Fanny. Earleen Newport, DO  [facility]  [facility]   Electronically Signed   By: Corrie Mckusick D.O.   On: 07/17/2014 15:47   Ir Angio Intra Extracran Sel Com Carotid Innominate Bilat Mod Sed  07/05/2014   CLINICAL DATA:  Vertebrobasilar insufficiency.  EXAM: BILATERAL COMMON CAROTID AND INNOMINATE ANGIOGRAPHY AND RIGHT SUBCLAVIAN ARTERY AND LEFT VERTEBRAL ARTERY ANGIOGRAM  PROCEDURE: Contrast: 60 mL OMNIPAQUE IOHEXOL 300 MG/ML  SOLN  Anesthesia/Sedation:  Conscious  sedation.  Medications: Versed 1 mg IV.  Fentanyl 25 mcg IV.  Following a full explanation of the procedure along with the potential associated complications, an informed witnessed consent was obtained.  The right  groin was prepped and draped in the usual sterile fashion. Thereafter using modified Seldinger technique, transfemoral access into the right common femoral artery was obtained without difficulty. Over a 0.035 inch guidewire, a 5 French Pinnacle sheath was inserted. Through this, and also over 0.035 inch guidewire, a 5 French JB1 catheter was advanced to the aortic arch region and selectively positioned in the right common carotid artery, the right subclavian artery, the left common carotid artery and the left vertebral artery.  There were no acute complications. The patient tolerated the procedure well.  FINDINGS: The right common carotid arteriogram demonstrates the right external carotid artery and its major branches to be widely patent.  The right internal carotid artery at the bulb demonstrates a smooth shallow plaque along the medial aspect without evidence of underlying stenosis or ulcerations.  The vessel is, otherwise, seen to opacify to the cranial skull base.  Mild FMD-like changes are seen in the mid cervical right ICA stable.  Distal to this the right cervical ICA is normal.  The petrous, cavernous and the supraclinoid segments are widely patent.  The right middle and the right anterior cerebral arteries are seen to opacify normally into the capillary and venous phases. Focal areas of mild caliber irregularity are seen involving the pericallosal and anterior perisylvian branches probably representing intracranial arteriosclerosis.  The right subclavian arteriogram demonstrates complete angiographic occlusion of the right vertebral artery at its origin.  The left common carotid arteriogram demonstrates approximately 15% narrowing of the left internal carotid artery just distal to the bulb  secondary to a smooth calcified plaque by the NASCET criteria.  No ulcerations are seen.  The vessel is, otherwise, seen to opacify to the cranial skull base. Suggestion of mild FMD-like changes noted in the mid cervical segment.  More distally the left internal carotid artery is seen to opacify normally to the cranial skull base.  The petrous and the proximal cavernous segments are widely patent.  There is approximately 50-60% narrowing of the left internal carotid artery supraclinoid segment.  The left middle and the left anterior cerebral arteries opacify into the capillary and venous phases. The dominant left vertebral artery origin demonstrates an approximately 70-75% narrowing. More distally the vessel is seen to opacify to the cranial skull base.  Mild FMD-like changes are also suggested in the distal horizontal segment of the left internal carotid artery at the level of C1.  Distal to this the vertebrobasilar junction and the left posterior inferior cerebellar arteries are seen to opacify normally.  The basilar artery itself demonstrates approximately 40% narrowing at its mid segment.  The posterior cerebral arteries, the superior cerebellar arteries and the anterior-inferior cerebellar arteries are seen to opacify into the capillary and venous phases.  Mild to moderate arteriosclerotic changes are seen in the left posterior cerebral artery P1 segment.  Also seen is retrograde opacification of the distal right vertebrobasilar junction.  Mild caliber irregularity is also noted of the right P2 segment of the PCA.  IMPRESSION: Interval progression of worsening stenoses of the dominant left vertebral artery at its origin with now a 70-75% narrowing.  Approximately 50% narrowing of the left internal carotid artery supraclinoid segment.  Approximately 15% narrowing of the left internal carotid artery just distal to the bulb.  Completely occluded right vertebral artery at its origin.  The angiographic findings were  reviewed with the patient and the patient's daughter. Given the patient's symptoms despite being on the anti coagulation and on antiplatelets, and with the progression of  the stenosis of the left vertebral artery to 70-75% endovascular revascularization of the dominant left vertebral artery at its origin to augment intracranial flow was briefly discussed.  Both she and her daughter would like to proceed with the endovascular angioplasty mild with or without stenting of the origin of the left vertebral artery as described. This will be be scheduled at the earliest possible.   Electronically Signed   By: Luanne Bras M.D.   On: 06/30/2014 14:15   Ir Angio Vertebral Sel Vertebral Uni L Mod Sed  07/05/2014   CLINICAL DATA:  Vertebrobasilar insufficiency.  EXAM: BILATERAL COMMON CAROTID AND INNOMINATE ANGIOGRAPHY AND RIGHT SUBCLAVIAN ARTERY AND LEFT VERTEBRAL ARTERY ANGIOGRAM  PROCEDURE: Contrast: 60 mL OMNIPAQUE IOHEXOL 300 MG/ML  SOLN  Anesthesia/Sedation:  Conscious sedation.  Medications: Versed 1 mg IV.  Fentanyl 25 mcg IV.  Following a full explanation of the procedure along with the potential associated complications, an informed witnessed consent was obtained.  The right groin was prepped and draped in the usual sterile fashion. Thereafter using modified Seldinger technique, transfemoral access into the right common femoral artery was obtained without difficulty. Over a 0.035 inch guidewire, a 5 French Pinnacle sheath was inserted. Through this, and also over 0.035 inch guidewire, a 5 French JB1 catheter was advanced to the aortic arch region and selectively positioned in the right common carotid artery, the right subclavian artery, the left common carotid artery and the left vertebral artery.  There were no acute complications. The patient tolerated the procedure well.  FINDINGS: The right common carotid arteriogram demonstrates the right external carotid artery and its major branches to be widely  patent.  The right internal carotid artery at the bulb demonstrates a smooth shallow plaque along the medial aspect without evidence of underlying stenosis or ulcerations.  The vessel is, otherwise, seen to opacify to the cranial skull base.  Mild FMD-like changes are seen in the mid cervical right ICA stable.  Distal to this the right cervical ICA is normal.  The petrous, cavernous and the supraclinoid segments are widely patent.  The right middle and the right anterior cerebral arteries are seen to opacify normally into the capillary and venous phases. Focal areas of mild caliber irregularity are seen involving the pericallosal and anterior perisylvian branches probably representing intracranial arteriosclerosis.  The right subclavian arteriogram demonstrates complete angiographic occlusion of the right vertebral artery at its origin.  The left common carotid arteriogram demonstrates approximately 15% narrowing of the left internal carotid artery just distal to the bulb secondary to a smooth calcified plaque by the NASCET criteria.  No ulcerations are seen.  The vessel is, otherwise, seen to opacify to the cranial skull base. Suggestion of mild FMD-like changes noted in the mid cervical segment.  More distally the left internal carotid artery is seen to opacify normally to the cranial skull base.  The petrous and the proximal cavernous segments are widely patent.  There is approximately 50-60% narrowing of the left internal carotid artery supraclinoid segment.  The left middle and the left anterior cerebral arteries opacify into the capillary and venous phases. The dominant left vertebral artery origin demonstrates an approximately 70-75% narrowing. More distally the vessel is seen to opacify to the cranial skull base.  Mild FMD-like changes are also suggested in the distal horizontal segment of the left internal carotid artery at the level of C1.  Distal to this the vertebrobasilar junction and the left posterior  inferior cerebellar arteries are seen to opacify normally.  The basilar artery itself demonstrates approximately 40% narrowing at its mid segment.  The posterior cerebral arteries, the superior cerebellar arteries and the anterior-inferior cerebellar arteries are seen to opacify into the capillary and venous phases.  Mild to moderate arteriosclerotic changes are seen in the left posterior cerebral artery P1 segment.  Also seen is retrograde opacification of the distal right vertebrobasilar junction.  Mild caliber irregularity is also noted of the right P2 segment of the PCA.  IMPRESSION: Interval progression of worsening stenoses of the dominant left vertebral artery at its origin with now a 70-75% narrowing.  Approximately 50% narrowing of the left internal carotid artery supraclinoid segment.  Approximately 15% narrowing of the left internal carotid artery just distal to the bulb.  Completely occluded right vertebral artery at its origin.  The angiographic findings were reviewed with the patient and the patient's daughter. Given the patient's symptoms despite being on the anti coagulation and on antiplatelets, and with the progression of the stenosis of the left vertebral artery to 70-75% endovascular revascularization of the dominant left vertebral artery at its origin to augment intracranial flow was briefly discussed.  Both she and her daughter would like to proceed with the endovascular angioplasty mild with or without stenting of the origin of the left vertebral artery as described. This will be be scheduled at the earliest possible.   Electronically Signed   By: Luanne Bras M.D.   On: 06/30/2014 14:15    Labs:  CBC:  Recent Labs  02/18/14 1515 06/30/14 1000 07/17/14 1447  WBC 6.4 6.6 6.6  HGB 14.1 14.7 14.0  HCT 42.8 43.4 41.6  PLT 249 230 233    COAGS:  Recent Labs  02/18/14 1515  06/30/14 1000 07/04/14 1526 07/07/14 1151 07/19/14 0813  INR 2.03*  < > 1.25 1.7 2.1 1.31   APTT 39*  --   --   --   --  29  < > = values in this interval not displayed.  BMP:  Recent Labs  02/18/14 1515 03/24/14 1542 06/30/14 1054 07/17/14 1447  NA 137 141 140 137  K 3.3* 3.4* 3.9 4.0  CL 101 102 101 102  CO2 28 35* 29 25  GLUCOSE 148* 79 126* 175*  BUN 9 15 9 20   CALCIUM 9.4 9.0 9.7 9.0  CREATININE 0.72 0.94 0.80 0.98  GFRNONAA 81*  --  >60 54*  GFRAA >90  --  >60 >60    LIVER FUNCTION TESTS:  Recent Labs  09/21/13 1508 01/03/14 1550 02/18/14 1515 07/17/14 1447  BILITOT 0.5 0.8 0.7 0.7  AST 38* 49* 44* 46*  ALT 31 32 27 26  ALKPHOS 45 49 47 42  PROT 7.6 7.1 6.5 6.1*  ALBUMIN 4.6 4.5 4.0 4.0    TUMOR MARKERS: No results for input(s): AFPTM, CEA, CA199, CHROMGRNA in the last 8760 hours.  Assessment and Plan:  Worsening dizziness L Vertebral artery stenosis Now scheduled for L VA angioplasty/stent placement Risks and Benefits discussed with the patient including, but not limited to bleeding, infection, vascular injury, contrast induced renal failure, stroke or even death. All of the patient's questions were answered, patient is agreeable to proceed. Consent signed and in chart.  Pt aware may be admitted overnight into Neuro ICU Plan for discharge following day.  Thank you for this interesting consult.  I greatly enjoyed meeting Amber [name] and look forward to participating in their care.  Signed: Whisper Kurka A 07/27/2014, 10:13 AM   I spent a total  of  20 Minutes   in face to face in clinical consultation, greater than 50% of which was counseling/coordinating care for L VA stenosis

## 2014-07-27 NOTE — Transfer of Care (Signed)
Immediate Anesthesia Transfer of Care Note  Patient: Amber [name]  Procedure(s) Performed: Procedure(s): ANGIOPLASTY (N/A)  Patient Location: NICU  Anesthesia Type:MAC  Level of Consciousness: awake, alert  and oriented  Airway & Oxygen Therapy: Patient Spontanous Breathing and Patient connected to nasal cannula oxygen  Post-op Assessment: Report given to RN and Post -op Vital signs reviewed and stable  Post vital signs: Reviewed and stable  Last Vitals:  Filed Vitals:   07/19/14 0729  BP: 135/99  Pulse: 59  Temp: 36.4 C  Resp: 18    Complications: No apparent anesthesia complications

## 2014-07-27 NOTE — Procedures (Signed)
S/P   Lt vertebral arteriogram. RT CFA approach. Findings. 1.Approx 60 % stenosis of Lt VA origin

## 2014-07-27 NOTE — Progress Notes (Signed)
Pre- intervention pulses, DP 1t, PT 1t bilat, Foley 18 french (Latex free) placed

## 2014-07-27 NOTE — Anesthesia Preprocedure Evaluation (Addendum)
Anesthesia Evaluation  Patient identified by MRN, date of birth, ID band Patient awake    Reviewed: Allergy & Precautions, NPO status , Patient's Chart, lab work & pertinent test results  Airway Mallampati: II  TM Distance: >3 FB Neck ROM: Full    Dental no notable dental hx. (+) Edentulous Upper, Dental Advisory Given   Pulmonary pneumonia -, COPDformer smoker,  breath sounds clear to auscultation  Pulmonary exam normal       Cardiovascular hypertension, Pt. on medications + CAD, + CABG, + Peripheral Vascular Disease and +CHF + dysrhythmias Atrial Fibrillation Rhythm:Irregular Rate:Normal  02/13/13 Echo: - Left ventricle: The cavity size was normal. Systolic function was normal. The estimated ejection fraction was in the range of 55% to 60%. Wall motion was normal; there were no regional wall motion abnormalities. - Aortic valve: Mild regurgitation. - Left atrium: The atrium was moderately dilated. - Right atrium: The atrium was mildly to moderately dilated. - Pulmonary arteries: Systolic pressure was moderately increased. PA peak pressure: 32mm Hg (S).   Neuro/Psych CVA negative psych ROS   GI/Hepatic Neg liver ROS, GERD-  ,  Endo/Other  Hypothyroidism   Renal/GU negative Renal ROS  negative genitourinary   Musculoskeletal  (+) Arthritis -, Fibromyalgia -  Abdominal   Peds negative pediatric ROS (+)  Hematology  (+) anemia ,   Anesthesia Other Findings   Reproductive/Obstetrics negative OB ROS                            Anesthesia Physical  Anesthesia Plan  ASA: III  Anesthesia Plan: MAC and General   Post-op Pain Management:    Induction: Intravenous  Airway Management Planned: Simple Face Mask and Oral ETT  Additional Equipment: Arterial line  Intra-op Plan:   Post-operative Plan: Extubation in OR  Informed Consent: I have reviewed the patients History and  Physical, chart, labs and discussed the procedure including the risks, benefits and alternatives for the proposed anesthesia with the patient or authorized representative who has indicated his/her understanding and acceptance.   Dental advisory given  Plan Discussed with: CRNA and Surgeon  Anesthesia Plan Comments:         Anesthesia Quick Evaluation

## 2014-07-27 NOTE — Progress Notes (Addendum)
Patient has several areas on lower legs where she scratched and then put on band aid--now has redden areas.  Have called Jannifer Franklin, PA to come see.  Family doesn't want labs drawn or IV placed until those areas are looked at.  Spoke with Dr. Estanislado Pandy regarding nimotop--not to be given.  DA

## 2014-07-28 ENCOUNTER — Telehealth: Payer: Self-pay | Admitting: *Deleted

## 2014-07-28 ENCOUNTER — Other Ambulatory Visit: Payer: Self-pay | Admitting: *Deleted

## 2014-07-28 ENCOUNTER — Encounter (HOSPITAL_COMMUNITY): Payer: Self-pay | Admitting: Interventional Radiology

## 2014-07-28 MED ORDER — ENOXAPARIN SODIUM 80 MG/0.8ML ~~LOC~~ SOLN: 80.0000 mg | Freq: Two times a day (BID) | SUBCUTANEOUS | Status: DC

## 2014-07-28 NOTE — Anesthesia Postprocedure Evaluation (Signed)
Anesthesia Post Note  Patient: Amber [name]  Procedure(s) Performed: Procedure(s) (LRB): ANGIOPLASTY (N/A)  Anesthesia type: MAC  Patient location: PACU  Post pain: Pain level controlled  Post assessment: Post-op Vital signs reviewed  Last Vitals: BP 132/46 mmHg  Pulse 63  Temp(Src) 36.4 C (Oral)  Resp 20  Ht 5\' 4"  (1.626 m)  Wt 170 lb (77.111 kg)  BMI 29.17 kg/m2  SpO2 96%  Post vital signs: Reviewed  Level of consciousness: awake  Complications: No apparent anesthesia complications

## 2014-07-28 NOTE — Telephone Encounter (Signed)
Daughter called and stated that patient had another Cerebral arteriogram on 07/27/14 by Dr. Estanislado Pandy.  She states her mother restarted coumadin last night and needed further instructions.  Advised to take an extra 1/2 tablet for 2 days and continue Lovenox injections twice a day every twelve hours until appointment with CV[facility]. The daughter verbalized understanding and stated they needed more Lovenox. I sent in a Lovenox refill to the requested pharmacy per family.

## 2014-08-02 ENCOUNTER — Other Ambulatory Visit (INDEPENDENT_AMBULATORY_CARE_PROVIDER_SITE_OTHER): Payer: Medicare Other | Admitting: *Deleted

## 2014-08-02 ENCOUNTER — Ambulatory Visit (INDEPENDENT_AMBULATORY_CARE_PROVIDER_SITE_OTHER): Payer: Medicare Other | Admitting: Neurology

## 2014-08-02 ENCOUNTER — Encounter: Payer: Self-pay | Admitting: Neurology

## 2014-08-02 ENCOUNTER — Ambulatory Visit (INDEPENDENT_AMBULATORY_CARE_PROVIDER_SITE_OTHER): Payer: Medicare Other | Admitting: *Deleted

## 2014-08-02 VITALS — BP 156/74 | HR 68 | Ht 64.0 in | Wt 165.6 lb

## 2014-08-02 DIAGNOSIS — I639 Cerebral infarction, unspecified: Secondary | ICD-10-CM

## 2014-08-02 DIAGNOSIS — I119 Hypertensive heart disease without heart failure: Secondary | ICD-10-CM | POA: Diagnosis not present

## 2014-08-02 DIAGNOSIS — I4891 Unspecified atrial fibrillation: Secondary | ICD-10-CM

## 2014-08-02 DIAGNOSIS — R413 Other amnesia: Secondary | ICD-10-CM

## 2014-08-02 DIAGNOSIS — R269 Unspecified abnormalities of gait and mobility: Secondary | ICD-10-CM

## 2014-08-02 DIAGNOSIS — E785 Hyperlipidemia, unspecified: Secondary | ICD-10-CM | POA: Diagnosis not present

## 2014-08-02 DIAGNOSIS — I6350 Cerebral infarction due to unspecified occlusion or stenosis of unspecified cerebral artery: Secondary | ICD-10-CM

## 2014-08-02 HISTORY — DX: Unspecified abnormalities of gait and mobility: R26.9

## 2014-08-02 LAB — BASIC METABOLIC PANEL
BUN: 22 mg/dL (ref 6–23)
CALCIUM: 9.8 mg/dL (ref 8.4–10.5)
CO2: 29 mEq/L (ref 19–32)
CREATININE: 1.14 mg/dL (ref 0.40–1.20)
Chloride: 101 mEq/L (ref 96–112)
GFR: 49.02 mL/min — AB (ref >60.00)
GLUCOSE: 109 mg/dL — AB (ref 70–99)
Potassium: 4.7 mEq/L (ref 3.5–5.1)
Sodium: 137 mEq/L (ref 135–145)

## 2014-08-02 LAB — HEPATIC FUNCTION PANEL
ALK PHOS: 40 U/L (ref 39–117)
ALT: 26 U/L (ref 0–35)
AST: 32 U/L (ref 0–37)
Albumin: 4.3 g/dL (ref 3.5–5.2)
BILIRUBIN DIRECT: 0 mg/dL (ref 0.0–0.3)
BILIRUBIN TOTAL: 0.5 mg/dL (ref 0.2–1.2)
TOTAL PROTEIN: 7.2 g/dL (ref 6.0–8.3)

## 2014-08-02 LAB — LDL CHOLESTEROL, DIRECT: LDL DIRECT: 236 mg/dL

## 2014-08-02 LAB — LIPID PANEL
CHOL/HDL RATIO: 9
CHOLESTEROL: 358 mg/dL — AB (ref 0–200)
HDL: 40.4 mg/dL (ref >39.00)
NONHDL: 317.6
TRIGLYCERIDES: 310 mg/dL — AB (ref 0.0–149.0)
VLDL: 62 mg/dL — AB (ref 0.0–40.0)

## 2014-08-02 LAB — POCT INR: INR: 1.5

## 2014-08-02 MED ORDER — SUVOREXANT 10 MG PO TABS: 10.0000 mg | ORAL_TABLET | Freq: Every evening | ORAL | Status: DC | PRN

## 2014-08-02 MED ORDER — AMITRIPTYLINE HCL 10 MG PO TABS
ORAL_TABLET | ORAL | Status: DC
Start: 2014-08-02 — End: 2014-08-08

## 2014-08-02 MED ORDER — AMITRIPTYLINE HCL 25 MG PO TABS: 25.0000 mg | ORAL_TABLET | Freq: Every day | ORAL | Status: DC

## 2014-08-02 NOTE — Progress Notes (Signed)
Reason for visit: Gait disorder, memory disorder  Amber [name] is an 80 y.o. female  History of present illness:  Amber [name] is a 80 year old right-handed white female with a history of cerebrovascular disease with a prior pontine stroke with a residual left hemiparesis. The patient has an associated gait disorder, and she uses a walker for ambulation. She still falls on occasion. The patient uses a walker at all times. She recently was seen by Dr. Estanislado Pandy for evaluation of the posterior circulation stenosis. She was found to have a 60% left vertebral artery stenosis at the origin, the right side is occluded. The patient did not undergo stenting for this lesion. She has been placed on low-dose Aricept taking 5 mg daily, higher doses results in vivid dreams. The Namenda was added to this, this seems to be well tolerated and beneficial. She continues on amitriptyline at 50 mg at night. She could not tolerate the reduction to a 25 mg dose secondary to insomnia. She returns to this office for an evaluation.  Past Medical History  Diagnosis Date  . Coronary artery disease     a. s/p CABG;  b. 04/2010 Neg MV  . Hypertension   . Hyperlipidemia   . Hypothyroidism   . Depression   . Insomnia   . Fibromyalgia   . Gout   . Osteoarthritis   . Osteoporosis   . Cervical spinal stenosis   . Nasal polyposis   . Pneumonia     1990  . COPD with emphysema     PFT 05/02/10>>FEV1 1.35(62%), FEV1% 66, DLCO 75%  . Chronic diastolic CHF (congestive heart failure)     a. 02/2013 Echo: EF 55-60%, mild AI, mod dil LA.  [name] Atrial fibrillation     a. chronic/rate controlled;  b. chronic coumadin.  . CVA (cerebral vascular accident)     left sided weakness  . GERD (gastroesophageal reflux disease)     pepcid 2-3 times per week  . Intermittent confusion   . History of melanoma     squamous cell, melanoma  . Memory change 01/23/2014  . Dementia   . Abnormality of gait 08/02/2014    Past Surgical  History  Procedure Laterality Date  . Abdominal hysterectomy    . Breast lumpectomy  1980s    Benign lesion - right  . Carpel tunnel      right  . Tonsillectomy    . Eye surgery      bilateral cataracts  . Coronary artery bypass graft  2000  . Radiology with anesthesia  12/24/2011    Procedure: RADIOLOGY WITH ANESTHESIA;  Surgeon: Medication Radiologist, MD;  Location: [facility] [facility];  Service: Radiology;  Laterality: N/A;  Extra Cranial Vascular Stent  . Radiology with anesthesia N/A 07/19/2014    Procedure: ANGIOPLASTY;  Surgeon: Luanne Bras, MD;  Location: [facility];  Service: Radiology;  Laterality: N/A;  . Radiology with anesthesia N/A 07/27/2014    Procedure: ANGIOPLASTY;  Surgeon: Luanne Bras, MD;  Location: [place];  Service: Radiology;  Laterality: N/A;    Family History  Problem Relation Age of Onset  . Stroke Mother   . CAD Father   . Diabetes type II Brother   . CAD Brother     Social history:  reports that she quit smoking about 37 years ago. Her smoking use included Cigarettes. She has a 37.5 pack-year smoking history. She has never used smokeless tobacco. She reports that she does not drink alcohol or use illicit  drugs.    Allergies  Allergen Reactions  . Ezetimibe Other (See Comments)    Myalgia  . Welchol [Colesevelam Hcl] Other (See Comments)    Muscle aches  . Adhesive [Tape] Itching, Rash and Other (See Comments)    Burning   . Ceclor [Cefaclor] Other (See Comments)    Unknown allergic reaction  . Elastic Bandages & [Zinc] Rash and Other (See Comments)    Turns red on the areas it touches  . Latex Itching, Rash and Other (See Comments)    Burning   . Lipitor [Atorvastatin Calcium] Other (See Comments)    Increased fibromyalgia pain  . Mevacor [Lovastatin] Other (See Comments)    Increased fibromyalgia pain  . Pravachol Other (See Comments)    Increased fibromyalgia pain  . Vasotec Other (See Comments)    Unknown allergic reaction    Medications:    Prior to Admission medications   Medication Sig Start Date End Date Taking? Authorizing Provider  alendronate (FOSAMAX) 70 MG tablet Take 70 mg by mouth every Sunday. Take with a full glass of water on an empty stomach.   Yes Historical Provider, MD  allopurinol (ZYLOPRIM) 300 MG tablet TAKE 1 TABLET EVERY DAY Patient taking differently: TAKE 1 TABLET EVERY OTHER DAY 07/24/14  Yes Darlin Coco, MD  amitriptyline (ELAVIL) 50 MG tablet Take 50 mg by mouth at bedtime.   Yes Historical Provider, MD  amLODipine (NORVASC) 5 MG tablet TAKE 1 TABLET (5 MG TOTAL) BY MOUTH EVERY EVENING. Patient taking differently: TAKE 1 TABLET (5 MG TOTAL) BY MOUTH DAILY AT BEDTIME 06/28/14  Yes Darlin Coco, MD  Calcium Carbonate-Vitamin D (CALCIUM + D PO) Take 1 tablet by mouth daily.   Yes Historical Provider, MD  celecoxib (CELEBREX) 200 MG capsule Take 200 mg by mouth 2 (two) times daily.    Yes Historical Provider, MD  citalopram (CELEXA) 20 MG tablet TAKE 1 TABLET (20 MG TOTAL) BY MOUTH 2 (TWO) TIMES DAILY. Patient taking differently: TAKE 1 TABLET (20 MG TOTAL) BY MOUTH DAILY. 01/30/14  Yes Darlin Coco, MD  clopidogrel (PLAVIX) 75 MG tablet TAKE 1 TABLET BY MOUTH EVERY DAY 06/05/14  Yes Darlin Coco, MD  Coenzyme Q10 (CO Q-10 PO) Take 1 capsule by mouth daily.   Yes Historical Provider, MD  digoxin (LANOXIN) 0.125 MG tablet TAKE 1 TABLET EVERY DAY Patient taking differently: TAKE 1 TABLET DAILY AT BEDTIME 06/09/14  Yes Darlin Coco, MD  donepezil (ARICEPT) 5 MG tablet Take 5 mg by mouth daily.   Yes Historical Provider, MD  enoxaparin (LOVENOX) 80 MG/0.8ML injection Inject 0.8 mLs (80 mg total) into the skin every 12 (twelve) hours. 07/28/14  Yes Darlin Coco, MD  ergocalciferol (VITAMIN D2) 50000 UNITS capsule Take 50,000 Units by mouth 2 (two) times a week. Sunday and Wednesday   Yes Historical Provider, MD  furosemide (LASIX) 40 MG tablet Take 40 mg by mouth daily. Take with an 80 mg  tablet every morning for a 120 mg dose   Yes Historical Provider, MD  furosemide (LASIX) 80 MG tablet TAKE 1 TABLET BY MOUTH TWICE A DAY 04/13/14  Yes Darlin Coco, MD  levothyroxine (SYNTHROID, LEVOTHROID) 25 MCG tablet Take 25 mcg by mouth daily.   Yes Historical Provider, MD  metoprolol succinate (TOPROL-XL) 25 MG 24 hr tablet TAKE 1 TABLET (25 MG TOTAL) BY MOUTH ONCE. Patient taking differently: TAKE 1 TABLET (25 MG TOTAL) BY MOUTH ONCE DAILY 06/05/14  Yes Darlin Coco, MD  Multiple Vitamins-Minerals (MULTIVITAMIN  WITH MINERALS) tablet Take 1 tablet by mouth daily.   Yes Historical Provider, MD  NAMENDA XR 28 MG CP24 24 hr capsule TAKE ONE CAPSULE BY MOUTH EVERY DAY 06/14/14  Yes Kathrynn Ducking, MD  nitroGLYCERIN (NITROSTAT) 0.4 MG SL tablet Place 1 tablet (0.4 mg total) under the tongue every 5 (five) minutes as needed for chest pain. 09/13/12  Yes Darlin Coco, MD  Polyethyl Glycol-Propyl Glycol (SYSTANE OP) Place 1 drop into both eyes daily.    Yes Historical Provider, MD  potassium chloride SA (KLOR-CON M20) 20 MEQ tablet Take 1 tablet (20 mEq total) by mouth 3 (three) times daily. 06/28/14  Yes Darlin Coco, MD  PROAIR HFA 108 (90 BASE) MCG/ACT inhaler INHALE 2 PUFFS INTO LUNGS EVERY 6 HOURS AS NEEDED FOR WHEEZING 12/22/13  Yes Darlin Coco, MD  valsartan (DIOVAN) 40 MG tablet Take 1 tablet (40 mg total) by mouth 2 (two) times daily. 01/03/14  Yes Darlin Coco, MD  warfarin (COUMADIN) 5 MG tablet Take 1 tablet (5 mg total) by mouth as directed. Take as directed by the [facility] Patient taking differently: Take 5-7.5 mg by mouth at bedtime. Take 1 tablet (7.5 mg) on Sunday, Monday, Wednesday, Thursday, Friday; take 1 1/2 tablets (5 mg) on Tuesday and Saturday or as directed by coumadin clinic.  PT MUST TAKE NAME BRAND COUMADIN ONLY 03/08/14  Yes Darlin Coco, MD    ROS:  Out of a complete 14 system review of symptoms, the patient complains only of the following  symptoms, and all other reviewed systems are negative.  Memory loss, numbness, weakness Gait disorder  Blood pressure 156/74, pulse 68, height 5\' 4"  (1.626 m), weight 165 lb 9.6 oz (75.116 kg).  Physical Exam  General: The patient is alert and cooperative at the time of the examination.  Skin: The patient wears a brace on the left foot and ankle.   Neurologic Exam  Mental status: The patient is alert and oriented x 2 at the time of the examination (not oriented to date). The patient has apparent normal recent and remote memory, with an apparently normal attention span and concentration ability. Mini-Mental Status Examination done today shows a total score 27/30. The patient is able to name 12 animals in 30 seconds.   Cranial nerves: Facial symmetry is present. Speech is normal, no aphasia or dysarthria is noted. Extraocular movements are full. Visual fields are full.  Motor: The patient has good strength in all 4 extremities.  Sensory examination: Soft touch sensation is symmetric on the face, arms, and legs.  Coordination: The patient has good finger-nose-finger and heel-to-shin bilaterally, with exception of slight dysmetria with the left upper extremity.  Gait and station: The patient has a slightly wide-based, unsteady gait. The patient uses a walker for ambulation.. Tandem gait was not tested. Romberg is negative. No drift is seen.  Reflexes: Deep tendon reflexes are symmetric.   Assessment/Plan:  1. Mild memory disturbance  2. Gait disturbance  3. Cerebrovascular disease  The patient is on amitriptyline which is anticholinergic, and may be impairing memory. The patient has had issues with insomnia with withdrawal of the medication. The patient will be given the 10 mg amitriptyline tablets, going to 45 mg at night for 2 weeks, 35 mg at night for 3 weeks, then to 25 mg at night. She will use the Belsomra for sleep if needed. I have given a prescription for the 10 mg  tablet. He will follow-up in 4 months. She will contact  our office if any issues arise.   Jill Alexanders MD 08/02/2014 7:59 PM  [facility] [address] [place], [place] [address]  Phone [phone] Fax [phone]

## 2014-08-02 NOTE — Patient Instructions (Addendum)
With the amitriptyline, reduce the 25 mg tablets to just 1 at night. We will add the 10 mg tablets taking 2 at night for 2 weeks, then go to 1 at night for 3 weeks, then stop the 10 mg tablets.  In the meantime, we will write a prescription for Belsomra, 10 mg tablets, you may take one at night if needed for sleep. If this dosing is not adequate, please call and we will increase the dose.  Fall Prevention and Home Safety Falls cause injuries and can affect all age groups. It is possible to use preventive measures to significantly decrease the likelihood of falls. There are many simple measures which can make your home safer and prevent falls. OUTDOORS  Repair cracks and edges of walkways and driveways.  Remove high doorway thresholds.  Trim shrubbery on the main path into your home.  Have good outside lighting.  Clear walkways of tools, rocks, debris, and clutter.  Check that handrails are not broken and are securely fastened. Both sides of steps should have handrails.  Have leaves, snow, and ice cleared regularly.  Use sand or salt on walkways during winter months.  In the garage, clean up grease or oil spills. BATHROOM  Install night lights.  Install grab bars by the toilet and in the tub and shower.  Use non-skid mats or decals in the tub or shower.  Place a plastic non-slip stool in the shower to sit on, if needed.  Keep floors dry and clean up all water on the floor immediately.  Remove soap buildup in the tub or shower on a regular basis.  Secure bath mats with non-slip, double-sided rug tape.  Remove throw rugs and tripping hazards from the floors. BEDROOMS  Install night lights.  Make sure a bedside light is easy to reach.  Do not use oversized bedding.  Keep a telephone by your bedside.  Have a firm chair with side arms to use for getting dressed.  Remove throw rugs and tripping hazards from the floor. KITCHEN  Keep handles on pots and pans  turned toward the center of the stove. Use back burners when possible.  Clean up spills quickly and allow time for drying.  Avoid walking on wet floors.  Avoid hot utensils and knives.  Position shelves so they are not too high or low.  Place commonly used objects within easy reach.  If necessary, use a sturdy step stool with a grab bar when reaching.  Keep electrical cables out of the way.  Do not use floor polish or wax that makes floors slippery. If you must use wax, use non-skid floor wax.  Remove throw rugs and tripping hazards from the floor. STAIRWAYS  Never leave objects on stairs.  Place handrails on both sides of stairways and use them. Fix any loose handrails. Make sure handrails on both sides of the stairways are as long as the stairs.  Check carpeting to make sure it is firmly attached along stairs. Make repairs to worn or loose carpet promptly.  Avoid placing throw rugs at the top or bottom of stairways, or properly secure the rug with carpet tape to prevent slippage. Get rid of throw rugs, if possible.  Have an electrician put in a light switch at the top and bottom of the stairs. OTHER FALL PREVENTION TIPS  Wear low-heel or rubber-soled shoes that are supportive and fit well. Wear closed toe shoes.  When using a stepladder, make sure it is fully opened and  both spreaders are firmly locked. Do not climb a closed stepladder.  Add color or contrast paint or tape to grab bars and handrails in your home. Place contrasting color strips on first and last steps.  Learn and use mobility aids as needed. Install an electrical emergency response system.  Turn on lights to avoid dark areas. Replace light bulbs that burn out immediately. Get light switches that glow.  Arrange furniture to create clear pathways. Keep furniture in the same place.  Firmly attach carpet with non-skid or double-sided tape.  Eliminate uneven floor surfaces.  Select a carpet pattern that  does not visually hide the edge of steps.  Be aware of all pets. OTHER HOME SAFETY TIPS  Set the water temperature for 120 F (48.8 C).  Keep emergency numbers on or near the telephone.  Keep smoke detectors on every level of the home and near sleeping areas. Document Released: 01/17/2002 Document Revised: 07/29/2011 Document Reviewed: 04/18/2011 [facility] Patient Information 2015 [place], [place]. This information is not intended to replace advice given to you by your health care provider. Make sure you discuss any questions you have with your health care provider.

## 2014-08-02 NOTE — Progress Notes (Signed)
Quick Note:  Please make copy of labs for patient visit. ______ 

## 2014-08-08 ENCOUNTER — Encounter: Payer: Self-pay | Admitting: Cardiology

## 2014-08-08 ENCOUNTER — Ambulatory Visit (INDEPENDENT_AMBULATORY_CARE_PROVIDER_SITE_OTHER): Payer: Medicare Other

## 2014-08-08 ENCOUNTER — Ambulatory Visit (INDEPENDENT_AMBULATORY_CARE_PROVIDER_SITE_OTHER): Payer: Medicare Other | Admitting: Cardiology

## 2014-08-08 VITALS — BP 126/58 | HR 59 | Ht 64.5 in | Wt 164.1 lb

## 2014-08-08 DIAGNOSIS — I4891 Unspecified atrial fibrillation: Secondary | ICD-10-CM

## 2014-08-08 DIAGNOSIS — I110 Hypertensive heart disease with heart failure: Secondary | ICD-10-CM

## 2014-08-08 DIAGNOSIS — E7800 Pure hypercholesterolemia, unspecified: Secondary | ICD-10-CM

## 2014-08-08 DIAGNOSIS — I639 Cerebral infarction, unspecified: Secondary | ICD-10-CM

## 2014-08-08 DIAGNOSIS — I5030 Unspecified diastolic (congestive) heart failure: Secondary | ICD-10-CM

## 2014-08-08 DIAGNOSIS — E039 Hypothyroidism, unspecified: Secondary | ICD-10-CM

## 2014-08-08 DIAGNOSIS — I482 Chronic atrial fibrillation, unspecified: Secondary | ICD-10-CM

## 2014-08-08 DIAGNOSIS — I119 Hypertensive heart disease without heart failure: Secondary | ICD-10-CM

## 2014-08-08 DIAGNOSIS — I6350 Cerebral infarction due to unspecified occlusion or stenosis of unspecified cerebral artery: Secondary | ICD-10-CM

## 2014-08-08 LAB — POCT INR: INR: 1.8

## 2014-08-08 NOTE — Patient Instructions (Signed)
Medication Instructions:  Your physician recommends that you continue on your current medications as directed. Please refer to the Current Medication list given to you today.  Labwork: none  Testing/Procedures: none  Follow-Up: Your physician wants you to follow-up in: 4 months with fasting labs (lp/bmet/hfpcbc/tsh/ft4)  You will receive a reminder letter in the mail two months in advance. If you don't receive a letter, please call our office to schedule the follow-up appointment.  Any Other Special Instructions Will Be Listed Below (If Applicable).

## 2014-08-08 NOTE — Progress Notes (Signed)
Cardiology Office Note   Date:  08/08/2014   ID:  Amber [name], Amber [name] 1934-07-07, MRN [id number]  PCP:  Gara Kroner, MD  Cardiologist: Darlin Coco MD  No chief complaint on file.     History of Present Illness: Amber [name] is a 80 y.o. female who presents for a four-month follow-up office visit.  This pleasant 80 year old woman is seen for a scheduled followup office visit. She was hospitalized 02/12/13 until 02/19/13 for acute pneumococcal pneumonia. She has made a good recovery from the pneumonia . She has a complex past medical history. She has known ischemic heart disease with coronary artery bypass graft surgery in 2000. She has had remote stroke with residual left hemiparesis. She's had a long history of high blood pressure and high cholesterol. She has established atrial fibrillation and is on Coumadin. She has chronically elevated CK levels and she has a history of fibromyalgia. She is followed for this by Dr. Lenna Gilford, rheumatologist with [facility]. She does not tolerate statins. She's had congestive heart failure secondary to diastolic dysfunction. She was cardioverted in June 2012 but remained in sinus rhythm for less than one week. She is now in established chronic atrial fibrillation on Coumadin.  The patient has a history of congestive heart failure.  This is chronic diastolic heart failure with preserved ejection fraction.  He currently is on Lasix en route and 20 mg in the morning and 80 mg in the late afternoon.  She is doing well on high-dose Lasix with preservation of renal function. previously we had switched her from Diovan to losartan because of cost . However, she did not do as well and we switched her back to Diovan and she feels better . The patient still has very little stamina when she walks.  The patient had an echocardiogram on 08/12/12 showing an ejection fraction of 60-65%. She also had a Lexa scan Myoview on 08/25/12 which showed no  evidence of ischemia and she does have an old inferior wall myocardial infarction scar. She had a repeat Lexi scan Myoview on 03/23/13 which showed findings similar to July 2014. There is inferior wall scar with surrounding peri-infarct ischemia.  Since last visit the patient has had no new cardiac symptoms. She has persistently high cholesterol levels and is intolerant of all statin drugs as well as ezetimibe. She was considered for one of the drug studies with the PCS K9 injections but was not qualified for it because of her high CK levels. she has a history of an old stroke. She has an Area of intracranial stenosis followed by Dr. Syble Creek. Her previous neurologist was Dr. Erling Cruz. Dr. Floyde Parkins is her new neurologist. He placed her on Namenda for her memory problem and she notes a distinct improvement since Namenda was added to her regimen. The patient denies any chest discomfort. She is not having any increased shortness of breath. She is not having any increased peripheral edema. She has reduced her Celebrex to just once a day. She has a new brace on her left foot which is working better. She has a history of a stenotic vessel in her brain.  She was scheduled to have this intervened upon but when last checked this stenosis had dropped from 70% to 60% and so the intervention was canceled. The patient feels that her memory has improved since she was placed on Namenda.   her neurologist is also trying to taper off her amitriptyline  Past Medical History  Diagnosis  Date  . Coronary artery disease     a. s/p CABG;  b. 04/2010 Neg MV  . Hypertension   . Hyperlipidemia   . Hypothyroidism   . Depression   . Insomnia   . Fibromyalgia   . Gout   . Osteoarthritis   . Osteoporosis   . Cervical spinal stenosis   . Nasal polyposis   . Pneumonia     1990  . COPD with emphysema     PFT 05/02/10>>FEV1 1.35(62%), FEV1% 66, DLCO 75%  . Chronic diastolic CHF (congestive heart failure)     a.  02/2013 Echo: EF 55-60%, mild AI, mod dil LA.  [name] Atrial fibrillation     a. chronic/rate controlled;  b. chronic coumadin.  . CVA (cerebral vascular accident)     left sided weakness  . GERD (gastroesophageal reflux disease)     pepcid 2-3 times per week  . Intermittent confusion   . History of melanoma     squamous cell, melanoma  . Memory change 01/23/2014  . Dementia   . Abnormality of gait 08/02/2014    Past Surgical History  Procedure Laterality Date  . Abdominal hysterectomy    . Breast lumpectomy  1980s    Benign lesion - right  . Carpel tunnel      right  . Tonsillectomy    . Eye surgery      bilateral cataracts  . Coronary artery bypass graft  2000  . Radiology with anesthesia  12/24/2011    Procedure: RADIOLOGY WITH ANESTHESIA;  Surgeon: Medication Radiologist, MD;  Location: [place];  Service: Radiology;  Laterality: N/A;  Extra Cranial Vascular Stent  . Radiology with anesthesia N/A 07/19/2014    Procedure: ANGIOPLASTY;  Surgeon: Luanne Bras, MD;  Location: [place];  Service: Radiology;  Laterality: N/A;  . Radiology with anesthesia N/A 07/27/2014    Procedure: ANGIOPLASTY;  Surgeon: Luanne Bras, MD;  Location: [place];  Service: Radiology;  Laterality: N/A;     Current Outpatient Prescriptions  Medication Sig Dispense Refill  . alendronate (FOSAMAX) 70 MG tablet Take 70 mg by mouth every Sunday. Take with a full glass of water on an empty stomach.    [name] allopurinol (ZYLOPRIM) 300 MG tablet Take 300 mg by mouth every other day.    [name] amitriptyline (ELAVIL) 25 MG tablet Take 1 tablet (25 mg total) by mouth at bedtime.    [name] amLODipine (NORVASC) 5 MG tablet Take 5 mg by mouth at bedtime.    . Calcium Carbonate-Vitamin D (CALCIUM + D PO) Take 1 tablet by mouth daily.    . celecoxib (CELEBREX) 200 MG capsule Take 200 mg by mouth 2 (two) times daily.     . citalopram (CELEXA) 20 MG tablet Take 20 mg by mouth daily.    . clopidogrel (PLAVIX) 75 MG tablet TAKE 1 TABLET  BY MOUTH EVERY DAY 90 tablet 1  . Coenzyme Q10 (CO Q-10 PO) Take 1 capsule by mouth daily.    . digoxin (LANOXIN) 0.125 MG tablet Take 0.125 mg by mouth daily.    [name] donepezil (ARICEPT) 5 MG tablet Take 5 mg by mouth daily.    [name] enoxaparin (LOVENOX) 80 MG/0.8ML injection Inject 0.8 mLs (80 mg total) into the skin every 12 (twelve) hours. 10 Syringe 1  . ergocalciferol (VITAMIN D2) 50000 UNITS capsule Take 50,000 Units by mouth 2 (two) times a week. Sunday and Wednesday    . furosemide (LASIX) 40 MG tablet Take 40 mg by  mouth daily. Take with an 80 mg tablet every morning for a 120 mg dose    . furosemide (LASIX) 80 MG tablet TAKE 1 TABLET BY MOUTH TWICE A DAY 60 tablet 5  . levothyroxine (SYNTHROID, LEVOTHROID) 25 MCG tablet Take 25 mcg by mouth daily.    . metoprolol succinate (TOPROL-XL) 25 MG 24 hr tablet Take 25 mg by mouth daily.    . Multiple Vitamins-Minerals (MULTIVITAMIN WITH MINERALS) tablet Take 1 tablet by mouth daily.    [name] NAMENDA XR 28 MG CP24 24 hr capsule TAKE ONE CAPSULE BY MOUTH EVERY DAY 30 capsule 1  . nitroGLYCERIN (NITROSTAT) 0.4 MG SL tablet Place 1 tablet (0.4 mg total) under the tongue every 5 (five) minutes as needed for chest pain. 25 tablet 3  . Polyethyl Glycol-Propyl Glycol (SYSTANE OP) Place 1 drop into both eyes daily.     . potassium chloride SA (KLOR-CON M20) 20 MEQ tablet Take 1 tablet (20 mEq total) by mouth 3 (three) times daily. 270 tablet 3  . PROAIR HFA 108 (90 BASE) MCG/ACT inhaler INHALE 2 PUFFS INTO LUNGS EVERY 6 HOURS AS NEEDED FOR WHEEZING 8.5 each 0  . valsartan (DIOVAN) 40 MG tablet Take 1 tablet (40 mg total) by mouth 2 (two) times daily. 180 tablet 3  . warfarin (COUMADIN) 5 MG tablet Take 1 tablet (5 mg total) by mouth as directed. Take as directed by the Coumadin Clinic (Patient taking differently: Take 5-7.5 mg by mouth at bedtime. Take 1 tablet (7.5 mg) on Sunday, Monday, Wednesday, Thursday, Friday; take 1 1/2 tablets (5 mg) on Tuesday and  Saturday or as directed by coumadin clinic.  PT MUST TAKE NAME BRAND COUMADIN ONLY) 120 tablet 1   No current facility-administered medications for this visit.    Allergies:   Ezetimibe; Welchol; Adhesive; Ceclor; Elastic bandages &; Latex; Lipitor; Mevacor; Pravachol; and Vasotec    Social History:  The patient  reports that she quit smoking about 37 years ago. Her smoking use included Cigarettes. She has a 37.5 pack-year smoking history. She has never used smokeless tobacco. She reports that she does not drink alcohol or use illicit drugs.   Family History:  The patient's family history includes CAD in her brother and father; Diabetes type II in her brother; Stroke in her mother.    ROS:  Please see the history of present illness.   Otherwise, review of systems are positive for none.   All other systems are reviewed and negative.    PHYSICAL EXAM: VS:  BP 126/58 mmHg  Pulse 59  Ht 5' 4.5" (1.638 m)  Wt 164 lb 1.9 oz (74.444 kg)  BMI 27.75 kg/m2 , BMI Body mass index is 27.75 kg/(m^2). GEN: Well nourished, well developed, in no acute distress HEENT: normal Neck: no JVD, carotid bruits, or masses Cardiac:  irregular irregular no murmurs, rubs, or gallops,no edema  Respiratory:  clear to auscultation bilaterally, normal work of breathing GI: soft, nontender, nondistended, + BS MS: no deformity or atrophy Skin: warm and dry, no rash Neuro:  Strength and sensation are intact Psych: euthymic mood, full affect   EKG:  EKG is not ordered today.    Recent Labs: 07/27/2014: Hemoglobin 14.0; Platelets 209 08/02/2014: ALT 26; BUN 22; Creatinine, Ser 1.14; Potassium 4.7; Sodium 137    Lipid Panel    Component Value Date/Time   CHOL 358* 08/02/2014 1418   TRIG 310.0* 08/02/2014 1418   HDL 40.40 08/02/2014 1418   CHOLHDL 9 08/02/2014  1418   VLDL 62.0* 08/02/2014 1418   LDLCALC * 04/28/2010 0110    161        Total Cholesterol/HDL:CHD Risk Coronary Heart Disease Risk Table                      Men   Women  1/2 Average Risk   3.4   3.3  Average Risk       5.0   4.4  2 X Average Risk   9.6   7.1  3 X Average Risk  23.4   11.0        Use the calculated Patient Ratio above and the CHD Risk Table to determine the patient's CHD Risk.        ATP III CLASSIFICATION (LDL):  <100     mg/dL   Optimal  100-129  mg/dL   Near or Above                    Optimal  130-159  mg/dL   Borderline  160-189  mg/dL   High  >190     mg/dL   Very High   LDLDIRECT 236.0 08/02/2014 1418      Wt Readings from Last 3 Encounters:  08/08/14 164 lb 1.9 oz (74.444 kg)  08/02/14 165 lb 9.6 oz (75.116 kg)  07/27/14 170 lb (77.111 kg)         ASSESSMENT AND PLAN:  1. ischemic heart disease with CABG in 2000 2. history of CVA in 2001 3. permanent atrial fibrillation on Coumadin 4. dyslipidemia with intolerance to statins and other cholesterol lowering medicine 5. history of fibromyalgia and chronically elevated CK levels 6. Hypothyroidism 7. chronic diastolic heart failure with ejection fraction of 60-65% 8. COPD 9. mild memory impairment, improved since the addition of Namenda   Current medicines are reviewed at length with the patient today.  The patient does not have concerns regarding medicines.  The following changes have been made:  no change  Labs/ tests ordered today include:   Orders Placed This Encounter  Procedures  . Lipid panel  . Hepatic function panel  . Basic metabolic panel  . CBC with Differential/Platelet  . TSH  . T4, free     disposition: Recheck in 4 months for office visit lipid panel hepatic function panel basal metabolic panel CBC TSH and free T4  Signed, Darlin Coco MD 08/08/2014 6:06 PM    [facility] [place] [address], [place], [place]  [address] Phone: [phone]; Fax: [phone]

## 2014-08-15 ENCOUNTER — Other Ambulatory Visit: Payer: Self-pay | Admitting: Neurology

## 2014-08-17 ENCOUNTER — Ambulatory Visit (INDEPENDENT_AMBULATORY_CARE_PROVIDER_SITE_OTHER): Payer: Medicare Other | Admitting: *Deleted

## 2014-08-17 ENCOUNTER — Other Ambulatory Visit: Payer: Self-pay | Admitting: *Deleted

## 2014-08-17 DIAGNOSIS — I639 Cerebral infarction, unspecified: Secondary | ICD-10-CM | POA: Diagnosis not present

## 2014-08-17 DIAGNOSIS — I6350 Cerebral infarction due to unspecified occlusion or stenosis of unspecified cerebral artery: Secondary | ICD-10-CM

## 2014-08-17 DIAGNOSIS — I4891 Unspecified atrial fibrillation: Secondary | ICD-10-CM

## 2014-08-17 LAB — POCT INR: INR: 2.5

## 2014-08-18 ENCOUNTER — Other Ambulatory Visit: Payer: Self-pay | Admitting: Neurology

## 2014-08-23 ENCOUNTER — Other Ambulatory Visit: Payer: Self-pay | Admitting: Adult Health

## 2014-09-05 ENCOUNTER — Ambulatory Visit (INDEPENDENT_AMBULATORY_CARE_PROVIDER_SITE_OTHER): Payer: Medicare Other | Admitting: *Deleted

## 2014-09-05 DIAGNOSIS — I639 Cerebral infarction, unspecified: Secondary | ICD-10-CM

## 2014-09-05 DIAGNOSIS — I4891 Unspecified atrial fibrillation: Secondary | ICD-10-CM | POA: Diagnosis not present

## 2014-09-05 DIAGNOSIS — I6350 Cerebral infarction due to unspecified occlusion or stenosis of unspecified cerebral artery: Secondary | ICD-10-CM

## 2014-09-05 LAB — POCT INR: INR: 3.3

## 2014-09-07 ENCOUNTER — Other Ambulatory Visit: Payer: Self-pay | Admitting: Neurology

## 2014-09-11 ENCOUNTER — Other Ambulatory Visit: Payer: Self-pay | Admitting: Neurology

## 2014-09-12 ENCOUNTER — Other Ambulatory Visit: Payer: Self-pay | Admitting: Neurology

## 2014-09-12 NOTE — Telephone Encounter (Signed)
Last OV note says: The patient will be given the 10 mg amitriptyline tablets, going to 45 mg at night for 2 weeks, 35 mg at night for 3 weeks, then to 25 mg at night

## 2014-09-14 ENCOUNTER — Other Ambulatory Visit: Payer: Self-pay | Admitting: Cardiology

## 2014-09-17 ENCOUNTER — Other Ambulatory Visit: Payer: Self-pay | Admitting: Cardiology

## 2014-09-19 ENCOUNTER — Ambulatory Visit (INDEPENDENT_AMBULATORY_CARE_PROVIDER_SITE_OTHER): Payer: Medicare Other | Admitting: *Deleted

## 2014-09-19 DIAGNOSIS — I4891 Unspecified atrial fibrillation: Secondary | ICD-10-CM | POA: Diagnosis not present

## 2014-09-19 DIAGNOSIS — I6350 Cerebral infarction due to unspecified occlusion or stenosis of unspecified cerebral artery: Secondary | ICD-10-CM

## 2014-09-19 DIAGNOSIS — I639 Cerebral infarction, unspecified: Secondary | ICD-10-CM

## 2014-09-19 LAB — POCT INR: INR: 3.7

## 2014-09-27 ENCOUNTER — Encounter: Payer: Self-pay | Admitting: Cardiology

## 2014-10-03 ENCOUNTER — Ambulatory Visit (INDEPENDENT_AMBULATORY_CARE_PROVIDER_SITE_OTHER): Payer: Medicare Other | Admitting: *Deleted

## 2014-10-03 DIAGNOSIS — I639 Cerebral infarction, unspecified: Secondary | ICD-10-CM | POA: Diagnosis not present

## 2014-10-03 DIAGNOSIS — I4891 Unspecified atrial fibrillation: Secondary | ICD-10-CM

## 2014-10-03 DIAGNOSIS — I6350 Cerebral infarction due to unspecified occlusion or stenosis of unspecified cerebral artery: Secondary | ICD-10-CM

## 2014-10-03 LAB — POCT INR: INR: 3.7

## 2014-10-13 ENCOUNTER — Other Ambulatory Visit: Payer: Self-pay | Admitting: Neurology

## 2014-10-16 ENCOUNTER — Other Ambulatory Visit: Payer: Self-pay | Admitting: Neurology

## 2014-10-19 ENCOUNTER — Ambulatory Visit (INDEPENDENT_AMBULATORY_CARE_PROVIDER_SITE_OTHER): Payer: Medicare Other | Admitting: Pharmacist

## 2014-10-19 DIAGNOSIS — I6350 Cerebral infarction due to unspecified occlusion or stenosis of unspecified cerebral artery: Secondary | ICD-10-CM

## 2014-10-19 DIAGNOSIS — I4891 Unspecified atrial fibrillation: Secondary | ICD-10-CM

## 2014-10-19 DIAGNOSIS — I639 Cerebral infarction, unspecified: Secondary | ICD-10-CM

## 2014-10-19 LAB — POCT INR: INR: 2

## 2014-10-24 ENCOUNTER — Other Ambulatory Visit: Payer: Self-pay | Admitting: Cardiology

## 2014-11-08 ENCOUNTER — Other Ambulatory Visit: Payer: Self-pay | Admitting: Cardiology

## 2014-11-09 NOTE — Telephone Encounter (Signed)
Okay to refill? 

## 2014-11-10 ENCOUNTER — Ambulatory Visit (INDEPENDENT_AMBULATORY_CARE_PROVIDER_SITE_OTHER): Payer: Medicare Other | Admitting: *Deleted

## 2014-11-10 DIAGNOSIS — I4891 Unspecified atrial fibrillation: Secondary | ICD-10-CM | POA: Diagnosis not present

## 2014-11-10 DIAGNOSIS — I639 Cerebral infarction, unspecified: Secondary | ICD-10-CM

## 2014-11-10 DIAGNOSIS — I6350 Cerebral infarction due to unspecified occlusion or stenosis of unspecified cerebral artery: Secondary | ICD-10-CM

## 2014-11-10 LAB — POCT INR: INR: 3.3

## 2014-11-27 ENCOUNTER — Other Ambulatory Visit: Payer: Self-pay | Admitting: Cardiology

## 2014-11-30 ENCOUNTER — Ambulatory Visit: Payer: Medicare Other | Admitting: Neurology

## 2014-12-01 ENCOUNTER — Ambulatory Visit (INDEPENDENT_AMBULATORY_CARE_PROVIDER_SITE_OTHER): Payer: Medicare Other | Admitting: *Deleted

## 2014-12-01 DIAGNOSIS — I4891 Unspecified atrial fibrillation: Secondary | ICD-10-CM

## 2014-12-01 DIAGNOSIS — I6350 Cerebral infarction due to unspecified occlusion or stenosis of unspecified cerebral artery: Secondary | ICD-10-CM | POA: Diagnosis not present

## 2014-12-01 LAB — POCT INR: INR: 2.8

## 2014-12-06 ENCOUNTER — Other Ambulatory Visit: Payer: Self-pay | Admitting: Cardiology

## 2014-12-11 ENCOUNTER — Ambulatory Visit (INDEPENDENT_AMBULATORY_CARE_PROVIDER_SITE_OTHER): Payer: Medicare Other | Admitting: Cardiology

## 2014-12-11 ENCOUNTER — Encounter: Payer: Self-pay | Admitting: Cardiology

## 2014-12-11 VITALS — BP 140/72 | HR 92 | Ht 66.0 in | Wt 165.0 lb

## 2014-12-11 DIAGNOSIS — I482 Chronic atrial fibrillation, unspecified: Secondary | ICD-10-CM

## 2014-12-11 DIAGNOSIS — I5032 Chronic diastolic (congestive) heart failure: Secondary | ICD-10-CM | POA: Diagnosis not present

## 2014-12-11 DIAGNOSIS — E039 Hypothyroidism, unspecified: Secondary | ICD-10-CM | POA: Diagnosis not present

## 2014-12-11 DIAGNOSIS — E7800 Pure hypercholesterolemia, unspecified: Secondary | ICD-10-CM | POA: Diagnosis not present

## 2014-12-11 LAB — CBC WITH DIFFERENTIAL/PLATELET
BASOS PCT: 1 % (ref 0–1)
Basophils Absolute: 0.1 10*3/uL (ref 0.0–0.1)
Eosinophils Absolute: 0.1 10*3/uL (ref 0.0–0.7)
Eosinophils Relative: 2 % (ref 0–5)
HCT: 43.3 % (ref 36.0–46.0)
HEMOGLOBIN: 14.5 g/dL (ref 12.0–15.0)
Lymphocytes Relative: 32 % (ref 12–46)
Lymphs Abs: 2.4 10*3/uL (ref 0.7–4.0)
MCH: 29.4 pg (ref 26.0–34.0)
MCHC: 33.5 g/dL (ref 30.0–36.0)
MCV: 87.7 fL (ref 78.0–100.0)
MONOS PCT: 10 % (ref 3–12)
MPV: 9.4 fL (ref 8.6–12.4)
Monocytes Absolute: 0.7 10*3/uL (ref 0.1–1.0)
NEUTROS ABS: 4.1 10*3/uL (ref 1.7–7.7)
NEUTROS PCT: 55 % (ref 43–77)
Platelets: 249 10*3/uL (ref 150–400)
RBC: 4.94 MIL/uL (ref 3.87–5.11)
RDW: 14.2 % (ref 11.5–15.5)
WBC: 7.4 10*3/uL (ref 4.0–10.5)

## 2014-12-11 LAB — BASIC METABOLIC PANEL
BUN: 15 mg/dL (ref 7–25)
CALCIUM: 9.6 mg/dL (ref 8.6–10.4)
CHLORIDE: 97 mmol/L — AB (ref 98–110)
CO2: 31 mmol/L (ref 20–31)
Creat: 0.86 mg/dL (ref 0.60–0.93)
Glucose, Bld: 103 mg/dL — ABNORMAL HIGH (ref 65–99)
POTASSIUM: 3.9 mmol/L (ref 3.5–5.3)
SODIUM: 138 mmol/L (ref 135–146)

## 2014-12-11 LAB — LIPID PANEL
CHOL/HDL RATIO: 8.9 ratio — AB (ref <=5.0)
Cholesterol: 392 mg/dL — ABNORMAL HIGH (ref 125–200)
HDL: 44 mg/dL — AB (ref >=46)
LDL CALC: 273 mg/dL — AB (ref <130)
Triglycerides: 373 mg/dL — ABNORMAL HIGH (ref <150)
VLDL: 75 mg/dL — AB (ref <30)

## 2014-12-11 LAB — HEPATIC FUNCTION PANEL
ALBUMIN: 4.4 g/dL (ref 3.6–5.1)
ALT: 16 U/L (ref 6–29)
AST: 28 U/L (ref 10–35)
Alkaline Phosphatase: 52 U/L (ref 33–130)
BILIRUBIN TOTAL: 0.8 mg/dL (ref 0.2–1.2)
Bilirubin, Direct: 0.1 mg/dL (ref <=0.2)
Indirect Bilirubin: 0.7 mg/dL (ref 0.2–1.2)
Total Protein: 6.7 g/dL (ref 6.1–8.1)

## 2014-12-11 LAB — T4, FREE: Free T4: 0.9 ng/dL (ref 0.80–1.80)

## 2014-12-11 LAB — TSH: TSH: 0.866 u[IU]/mL (ref 0.350–4.500)

## 2014-12-11 NOTE — Patient Instructions (Signed)
Medication Instructions:  Your physician recommends that you continue on your current medications as directed. Please refer to the Current Medication list given to you today.  Labwork: LP/BMET/CBC/HFP/TSH/FT4  Testing/Procedures: NONE  Follow-Up: Your physician recommends that you schedule a follow-up appointment in: 3 MONTH OV/EKG   If you need a refill on your cardiac medications before your next appointment, please call your pharmacy.

## 2014-12-11 NOTE — Progress Notes (Signed)
Cardiology Office Note   Date:  12/11/2014   ID:  Amber [name] 02-04-1935, MRN [id number]  PCP:  Gara Kroner, MD  Cardiologist: Darlin Coco MD  No chief complaint on file.     History of Present Illness: Amber [name] is a 80 y.o. female who presents for 3 month follow-up office visit  This pleasant 80 year old woman is seen for a scheduled followup office visit. She was hospitalized 02/12/13 until 02/19/13 for acute pneumococcal pneumonia. She has made a good recovery from the pneumonia . She has a complex past medical history. She has known ischemic heart disease with coronary artery bypass graft surgery in 2000. She has had remote stroke with residual left hemiparesis. She's had a long history of high blood pressure and high cholesterol. She has established atrial fibrillation and is on Coumadin. She has chronically elevated CK levels and she has a history of fibromyalgia. She is followed for this by Dr. Lenna Gilford, rheumatologist with [facility] She does not tolerate statins. She's had congestive heart failure secondary to diastolic dysfunction. She was cardioverted in June 2012 but remained in sinus rhythm for less than one week. She is now in established chronic atrial fibrillation on Coumadin.  The patient has a history of congestive heart failure. This is chronic diastolic heart failure with preserved ejection fraction. She is doing well on furosemide 80 mg twice a day.  She is on potassium 20 mEq 3 times a day and has felt better since we put her up to her present dose. She is doing well on high-dose Lasix with preservation of renal function. previously we had switched her from Diovan to losartan because of cost . However, she did not do as well and we switched her back to Diovan and she feels better . The patient still has very little stamina when she walks.  The patient had an echocardiogram on 08/12/12 showing an ejection fraction of 60-65%. She also  had a Lexa scan Myoview on 08/25/12 which showed no evidence of ischemia and she does have an old inferior wall myocardial infarction scar. She had a repeat Lexi scan Myoview on 03/23/13 which showed findings similar to July 2014. There is inferior wall scar with surrounding peri-infarct ischemia.  Since last visit the patient has had no new cardiac symptoms. She has persistently high cholesterol levels and is intolerant of all statin drugs as well as ezetimibe. She was considered for one of the drug studies with the PCS K9 injections but was not qualified for it because of her high CK levels. she has a history of an old stroke. She has an Area of intracranial stenosis followed by Dr. Syble Creek. Her previous neurologist was Dr. Erling Cruz. Dr. Floyde Parkins is her new neurologist. He placed her on Namenda for her memory problem and she notes a distinct improvement since Namenda was added to her regimen. The patient denies any chest discomfort. She is not having any increased shortness of breath. She is not having any increased peripheral edema. She has reduced her Celebrex to just once a day. She has a new brace on her left foot which is working better. She has a history of a stenotic vessel in her brain. She was scheduled to have this intervened upon but when last checked this stenosis had dropped from 70% to 60% and so the intervention was canceled. The patient feels that her memory has improved since she was placed on Namenda. She reports that her appetite is only  fair.  Her weight has been stable however.  She generally eats one meal per day at supper. We are checking fasting lab work on her today including thyroid function.  Past Medical History  Diagnosis Date  . Coronary artery disease     a. s/p CABG;  b. 04/2010 Neg MV  . Hypertension   . Hyperlipidemia   . Hypothyroidism   . Depression   . Insomnia   . Fibromyalgia   . Gout   . Osteoarthritis   . Osteoporosis   . Cervical spinal  stenosis   . Nasal polyposis   . Pneumonia     1990  . COPD with emphysema ([place])     PFT 05/02/10>>FEV1 1.35(62%), FEV1% 66, DLCO 75%  . Chronic diastolic CHF (congestive heart failure) ([place])     a. 02/2013 Echo: EF 55-60%, mild AI, mod dil LA.  [name] Atrial fibrillation ([place])     a. chronic/rate controlled;  b. chronic coumadin.  . CVA (cerebral vascular accident) ([place])     left sided weakness  . GERD (gastroesophageal reflux disease)     pepcid 2-3 times per week  . Intermittent confusion   . History of melanoma     squamous cell, melanoma  . Memory change 01/23/2014  . Dementia   . Abnormality of gait 08/02/2014    Past Surgical History  Procedure Laterality Date  . Abdominal hysterectomy    . Breast lumpectomy  1980s    Benign lesion - right  . Carpel tunnel      right  . Tonsillectomy    . Eye surgery      bilateral cataracts  . Coronary artery bypass graft  2000  . Radiology with anesthesia  12/24/2011    Procedure: RADIOLOGY WITH ANESTHESIA;  Surgeon: Medication Radiologist, MD;  Location: [facility];  Service: Radiology;  Laterality: N/A;  Extra Cranial Vascular Stent  . Radiology with anesthesia N/A 07/19/2014    Procedure: ANGIOPLASTY;  Surgeon: Luanne Bras, MD;  Location: [facility];  Service: Radiology;  Laterality: N/A;  . Radiology with anesthesia N/A 07/27/2014    Procedure: ANGIOPLASTY;  Surgeon: Luanne Bras, MD;  Location: [facility];  Service: Radiology;  Laterality: N/A;     Current Outpatient Prescriptions  Medication Sig Dispense Refill  . alendronate (FOSAMAX) 70 MG tablet Take 70 mg by mouth every Sunday. Take with a full glass of water on an empty stomach.    [name] allopurinol (ZYLOPRIM) 300 MG tablet Take 300 mg by mouth every other day.    [name] amitriptyline (ELAVIL) 25 MG tablet Take 1 tablet (25 mg total) by mouth at bedtime.    [name] amLODipine (NORVASC) 5 MG tablet Take 5 mg by mouth at bedtime.    . Calcium Carbonate-Vitamin D (CALCIUM + D PO) Take 1 tablet by  mouth daily.    . celecoxib (CELEBREX) 200 MG capsule Take 200 mg by mouth 2 (two) times daily.     . citalopram (CELEXA) 20 MG tablet TAKE 1 TABLET BY MOUTH TWICE A DAY 60 tablet 11  . clopidogrel (PLAVIX) 75 MG tablet TAKE 1 TABLET BY MOUTH EVERY DAY 90 tablet 1  . Coenzyme Q10 (CO Q-10 PO) Take 1 capsule by mouth daily.    [name] COUMADIN 5 MG tablet TAKE 1 TABLET (5 MG TOTAL) BY MOUTH AS DIRECTED. TAKE AS DIRECTED BY THE COUMADIN CLINIC 135 tablet 1  . digoxin (LANOXIN) 0.125 MG tablet Take 0.125 mg by mouth daily.    [name] donepezil (  ARICEPT) 5 MG tablet TAKE 1 TABLET (5 MG TOTAL) BY MOUTH DAILY. 90 tablet 1  . ergocalciferol (VITAMIN D2) 50000 UNITS capsule Take 50,000 Units by mouth 2 (two) times a week. Sunday and Wednesday    . furosemide (LASIX) 80 MG tablet TAKE 1 TABLET BY MOUTH TWICE A DAY 60 tablet 5  . levothyroxine (SYNTHROID, LEVOTHROID) 25 MCG tablet Take 25 mcg by mouth daily.    . metoprolol succinate (TOPROL-XL) 25 MG 24 hr tablet Take 25 mg by mouth daily.    . Multiple Vitamins-Minerals (MULTIVITAMIN WITH MINERALS) tablet Take 1 tablet by mouth daily.    [name] NAMENDA XR 28 MG CP24 24 hr capsule TAKE ONE CAPSULE BY MOUTH EVERY DAY 30 capsule 6  . nitroGLYCERIN (NITROSTAT) 0.4 MG SL tablet Place 1 tablet (0.4 mg total) under the tongue every 5 (five) minutes as needed for chest pain. 25 tablet 3  . Polyethyl Glycol-Propyl Glycol (SYSTANE OP) Place 1 drop into both eyes daily.     . potassium chloride SA (KLOR-CON M20) 20 MEQ tablet Take 1 tablet (20 mEq total) by mouth 3 (three) times daily. 270 tablet 3  . PROAIR HFA 108 (90 BASE) MCG/ACT inhaler INHALE 2 PUFFS INTO LUNGS EVERY 6 HOURS AS NEEDED FOR WHEEZING 8.5 each 0  . valsartan (DIOVAN) 40 MG tablet Take 1 tablet (40 mg total) by mouth 2 (two) times daily. 180 tablet 3   No current facility-administered medications for this visit.    Allergies:   Ezetimibe; Welchol; Adhesive; Ceclor; Elastic bandages &; Latex; Lipitor; Mevacor;  Pravachol; and Vasotec    Social History:  The patient  reports that she quit smoking about 37 years ago. Her smoking use included Cigarettes. She has a 37.5 pack-year smoking history. She has never used smokeless tobacco. She reports that she does not drink alcohol or use illicit drugs.   Family History:  The patient's family history includes CAD in her brother and father; Diabetes type II in her brother; Stroke in her mother.    ROS:  Please see the history of present illness.   Otherwise, review of systems are positive for none.   All other systems are reviewed and negative.    PHYSICAL EXAM: VS:  BP 140/72 mmHg  Pulse 92  Ht 5\' 6"  (1.676 m)  Wt 165 lb (74.844 kg)  BMI 26.64 kg/m2 , BMI Body mass index is 26.64 kg/(m^2). GEN: Well nourished, well developed, in no acute distress HEENT: normal Neck: no JVD, carotid bruits, or masses Cardiac: Irregularly irregular; no murmurs, rubs, or gallops,no edema  Respiratory:  clear to auscultation bilaterally, normal work of breathing GI: soft, nontender, nondistended, + BS MS: no deformity or atrophy Skin: warm and dry, no rash Neuro:  Strength and sensation are intact Psych: euthymic mood, full affect   EKG:  EKG is not ordered today.    Recent Labs: 07/27/2014: Hemoglobin 14.0; Platelets 209 08/02/2014: ALT 26; BUN 22; Creatinine, Ser 1.14; Potassium 4.7; Sodium 137    Lipid Panel    Component Value Date/Time   CHOL 358* 08/02/2014 1418   TRIG 310.0* 08/02/2014 1418   HDL 40.40 08/02/2014 1418   CHOLHDL 9 08/02/2014 1418   VLDL 62.0* 08/02/2014 1418   LDLCALC * 04/28/2010 0110    161        Total Cholesterol/HDL:CHD Risk Coronary Heart Disease Risk Table                     Men  Women  1/2 Average Risk   3.4   3.3  Average Risk       5.0   4.4  2 X Average Risk   9.6   7.1  3 X Average Risk  23.4   11.0        Use the calculated Patient Ratio above and the CHD Risk Table to determine the patient's CHD Risk.          ATP III CLASSIFICATION (LDL):  <100     mg/dL   Optimal  100-129  mg/dL   Near or Above                    Optimal  130-159  mg/dL   Borderline  160-189  mg/dL   High  >190     mg/dL   Very High   LDLDIRECT 236.0 08/02/2014 1418      Wt Readings from Last 3 Encounters:  12/11/14 165 lb (74.844 kg)  08/08/14 164 lb 1.9 oz (74.444 kg)  08/02/14 165 lb 9.6 oz (75.116 kg)         ASSESSMENT AND PLAN:  1. ischemic heart disease with CABG in 2000 2. history of CVA in 2001 3. permanent atrial fibrillation on Coumadin 4. dyslipidemia with intolerance to statins and other cholesterol lowering medicine 5. history of fibromyalgia and chronically elevated CK levels 6. Hypothyroidism 7. chronic diastolic heart failure with ejection fraction of 60-65% 8. COPD 9. mild memory impairment, improved since the addition of Namenda   Current medicines are reviewed at length with the patient today.  The patient does not have concerns regarding medicines.  The following changes have been made:  no change  Labs/ tests ordered today include:   Orders Placed This Encounter  Procedures  . Lipid panel  . Hepatic function panel  . Basic metabolic panel  . CBC with Differential/Platelet  . TSH  . T4, free     Disposition: Continue with current medication.  Follow up with me in 3 months for office visit and EKG.  Lab work today pending.  Berna Spare MD 12/11/2014 5:41 PM    [facility] [address], [place], [place]  [address] Phone: [phone]; Fax: [phone]

## 2014-12-21 ENCOUNTER — Ambulatory Visit: Payer: Medicare Other | Admitting: Neurology

## 2015-01-02 ENCOUNTER — Ambulatory Visit (INDEPENDENT_AMBULATORY_CARE_PROVIDER_SITE_OTHER): Payer: Medicare Other | Admitting: *Deleted

## 2015-01-02 DIAGNOSIS — I6350 Cerebral infarction due to unspecified occlusion or stenosis of unspecified cerebral artery: Secondary | ICD-10-CM | POA: Diagnosis not present

## 2015-01-02 DIAGNOSIS — I4891 Unspecified atrial fibrillation: Secondary | ICD-10-CM

## 2015-01-02 LAB — POCT INR: INR: 2.1

## 2015-01-17 ENCOUNTER — Ambulatory Visit (INDEPENDENT_AMBULATORY_CARE_PROVIDER_SITE_OTHER): Payer: Medicare Other | Admitting: Neurology

## 2015-01-17 ENCOUNTER — Encounter: Payer: Self-pay | Admitting: Neurology

## 2015-01-17 VITALS — BP 151/69 | HR 64 | Ht 66.0 in | Wt 167.5 lb

## 2015-01-17 DIAGNOSIS — R269 Unspecified abnormalities of gait and mobility: Secondary | ICD-10-CM

## 2015-01-17 DIAGNOSIS — I69398 Other sequelae of cerebral infarction: Secondary | ICD-10-CM | POA: Diagnosis not present

## 2015-01-17 DIAGNOSIS — I69359 Hemiplegia and hemiparesis following cerebral infarction affecting unspecified side: Secondary | ICD-10-CM | POA: Diagnosis not present

## 2015-01-17 DIAGNOSIS — R413 Other amnesia: Secondary | ICD-10-CM

## 2015-01-17 HISTORY — DX: Hemiplegia and hemiparesis following cerebral infarction affecting unspecified side: I69.359

## 2015-01-17 HISTORY — DX: Hemiplegia and hemiparesis following cerebral infarction affecting unspecified side: I69.398

## 2015-01-17 MED ORDER — AMITRIPTYLINE HCL 10 MG PO TABS: ORAL_TABLET | ORAL | Status: DC

## 2015-01-17 NOTE — Progress Notes (Signed)
Reason for visit: History of stroke, memory disorder  Amber [name] is an 80 y.o. female  History of present illness:  Ms. Bauers is a 80 year old right-handed white female with a history of cerebrovascular disease with a pontine stroke previously, associated with a left hemiparesis. The patient has a gait disorder as a residual. She does fall on occasion, the last fall was about one month ago. She uses a walker for ambulation. She wears a brace on the left ankle. She does have some memory issues, she is on low-dose Aricept and taking Namenda. Higher doses of Aricept resulted in vivid dreams and nightmares. The patient reports a runny nose on Aricept. The patient has been on a chronic daily dose of amitriptyline, and she has been tapered off of this medication gradually. She is now on 25 mg at night, she seems to be sleeping relatively well at night. She takes Belsomra on occasion if needed. She returns for an evaluation.   Past Medical History  Diagnosis Date  . Coronary artery disease     a. s/p CABG;  b. 04/2010 Neg MV  . Hypertension   . Hyperlipidemia   . Hypothyroidism   . Depression   . Insomnia   . Fibromyalgia   . Gout   . Osteoarthritis   . Osteoporosis   . Cervical spinal stenosis   . Nasal polyposis   . Pneumonia     1990  . COPD with emphysema ([place])     PFT 05/02/10>>FEV1 1.35(62%), FEV1% 66, DLCO 75%  . Chronic diastolic CHF (congestive heart failure) ([place])     a. 02/2013 Echo: EF 55-60%, mild AI, mod dil LA.  [name] Atrial fibrillation ([place])     a. chronic/rate controlled;  b. chronic coumadin.  . CVA (cerebral vascular accident) ([place])     left sided weakness  . GERD (gastroesophageal reflux disease)     pepcid 2-3 times per week  . Intermittent confusion   . History of melanoma     squamous cell, melanoma  . Memory change 01/23/2014  . Dementia   . Abnormality of gait 08/02/2014  . Hemiparesis and alteration of sensations as late effects of stroke ([place]) 01/17/2015     Past Surgical History  Procedure Laterality Date  . Abdominal hysterectomy    . Breast lumpectomy  1980s    Benign lesion - right  . Carpel tunnel      right  . Tonsillectomy    . Eye surgery      bilateral cataracts  . Coronary artery bypass graft  2000  . Radiology with anesthesia  12/24/2011    Procedure: RADIOLOGY WITH ANESTHESIA;  Surgeon: Medication Radiologist, MD;  Location: [facility];  Service: Radiology;  Laterality: N/A;  Extra Cranial Vascular Stent  . Radiology with anesthesia N/A 07/19/2014    Procedure: ANGIOPLASTY;  Surgeon: Luanne Bras, MD;  Location: [facility];  Service: Radiology;  Laterality: N/A;  . Radiology with anesthesia N/A 07/27/2014    Procedure: ANGIOPLASTY;  Surgeon: Luanne Bras, MD;  Location: [facility];  Service: Radiology;  Laterality: N/A;    Family History  Problem Relation Age of Onset  . Stroke Mother   . CAD Father   . Diabetes type II Brother   . CAD Brother     Social history:  reports that she quit smoking about 37 years ago. Her smoking use included Cigarettes. She has a 37.5 pack-year smoking history. She has never used smokeless tobacco. She reports that she  does not drink alcohol or use illicit drugs.    Allergies  Allergen Reactions  . Ezetimibe Other (See Comments)    Myalgia  . Welchol [Colesevelam Hcl] Other (See Comments)    Muscle aches  . Adhesive [Tape] Itching, Rash and Other (See Comments)    Burning   . Ceclor [Cefaclor] Other (See Comments)    Unknown allergic reaction  . Elastic Bandages & [Zinc] Rash and Other (See Comments)    Turns red on the areas it touches  . Latex Itching, Rash and Other (See Comments)    Burning   . Lipitor [Atorvastatin Calcium] Other (See Comments)    Increased fibromyalgia pain  . Mevacor [Lovastatin] Other (See Comments)    Increased fibromyalgia pain  . Pravachol Other (See Comments)    Increased fibromyalgia pain  . Vasotec Other (See Comments)    Unknown allergic reaction     Medications:  Prior to Admission medications   Medication Sig Start Date End Date Taking? Authorizing Provider  alendronate (FOSAMAX) 70 MG tablet Take 70 mg by mouth every Sunday. Take with a full glass of water on an empty stomach.   Yes Historical Provider, MD  allopurinol (ZYLOPRIM) 300 MG tablet Take 300 mg by mouth every other day.   Yes Historical Provider, MD  amitriptyline (ELAVIL) 25 MG tablet Take 1 tablet (25 mg total) by mouth at bedtime. 08/02/14  Yes Kathrynn Ducking, MD  amLODipine (NORVASC) 5 MG tablet Take 5 mg by mouth at bedtime.   Yes Historical Provider, MD  Calcium Carbonate-Vitamin D (CALCIUM + D PO) Take 1 tablet by mouth daily.   Yes Historical Provider, MD  celecoxib (CELEBREX) 200 MG capsule Take 200 mg by mouth 2 (two) times daily.    Yes Historical Provider, MD  citalopram (CELEXA) 20 MG tablet TAKE 1 TABLET BY MOUTH TWICE A DAY 11/09/14  Yes Darlin Coco, MD  clopidogrel (PLAVIX) 75 MG tablet TAKE 1 TABLET BY MOUTH EVERY DAY 12/06/14  Yes Darlin Coco, MD  Coenzyme Q10 (CO Q-10 PO) Take 1 capsule by mouth daily.   Yes Historical Provider, MD  COUMADIN 5 MG tablet TAKE 1 TABLET (5 MG TOTAL) BY MOUTH AS DIRECTED. TAKE AS DIRECTED BY THE COUMADIN CLINIC 09/18/14  Yes Darlin Coco, MD  digoxin (LANOXIN) 0.125 MG tablet Take 0.125 mg by mouth daily.   Yes Historical Provider, MD  donepezil (ARICEPT) 5 MG tablet TAKE 1 TABLET (5 MG TOTAL) BY MOUTH DAILY. 09/07/14  Yes Kathrynn Ducking, MD  ergocalciferol (VITAMIN D2) 50000 UNITS capsule Take 50,000 Units by mouth 2 (two) times a week. Sunday and Wednesday   Yes Historical Provider, MD  furosemide (LASIX) 80 MG tablet TAKE 1 TABLET BY MOUTH TWICE A DAY 10/24/14  Yes Darlin Coco, MD  levothyroxine (SYNTHROID, LEVOTHROID) 25 MCG tablet Take 25 mcg by mouth daily.   Yes Historical Provider, MD  metoprolol succinate (TOPROL-XL) 25 MG 24 hr tablet Take 25 mg by mouth daily.   Yes Historical Provider, MD   Multiple Vitamins-Minerals (MULTIVITAMIN WITH MINERALS) tablet Take 1 tablet by mouth daily.   Yes Historical Provider, MD  NAMENDA XR 28 MG CP24 24 hr capsule TAKE ONE CAPSULE BY MOUTH EVERY DAY 08/19/14  Yes Kathrynn Ducking, MD  nitroGLYCERIN (NITROSTAT) 0.4 MG SL tablet Place 1 tablet (0.4 mg total) under the tongue every 5 (five) minutes as needed for chest pain. 09/13/12  Yes Darlin Coco, MD  Polyethyl Glycol-Propyl Glycol (SYSTANE OP) Place 1  drop into both eyes daily.    Yes Historical Provider, MD  potassium chloride SA (KLOR-CON M20) 20 MEQ tablet Take 1 tablet (20 mEq total) by mouth 3 (three) times daily. 06/28/14  Yes Darlin Coco, MD  PROAIR HFA 108 (90 BASE) MCG/ACT inhaler INHALE 2 PUFFS INTO LUNGS EVERY 6 HOURS AS NEEDED FOR WHEEZING 12/22/13  Yes Darlin Coco, MD  valsartan (DIOVAN) 40 MG tablet Take 1 tablet (40 mg total) by mouth 2 (two) times daily. 01/03/14  Yes Darlin Coco, MD    ROS:  Out of a complete 14 system review of symptoms, the patient complains only of the following symptoms, and all other reviewed systems are negative.  Hearing loss Walking difficulty Bruising easily Memory loss, dizziness, numbness  Blood pressure 151/69, pulse 64, height 5\' 6"  (1.676 m), weight 167 lb 8 oz (75.978 kg).  Physical Exam  General: The patient is alert and cooperative at the time of the examination.  Skin: No significant peripheral edema is noted.   Neurologic Exam  Mental status: The patient is alert and oriented x 3 at the time of the examination. The patient has apparent normal recent and remote memory, with an apparently normal attention span and concentration ability. Mini-Mental Status Examination done today shows a total score of 30/30. The patient is able to name 6 animals in 30 seconds.   Cranial nerves: Facial symmetry is present. Speech is normal, no aphasia or dysarthria is noted. Extraocular movements are full. Visual fields are  full.  Motor: The patient has good strength in the right extremities. On the left side, the patient has 4/5 strength of grip, she has near normal strength of the proximal muscles of the left arm. The patient has 4/5 strength proximally in the left leg, she wears an ankle brace on the left.  Sensory examination: Soft touch sensation is symmetric on the face, arms, and legs.  Coordination: The patient has good finger-nose-finger and heel-to-shin on the right, she is able to perform on the left with more difficulty.  Gait and station: The patient has a mild circumduction gait with the left leg. The patient normally uses a walker for an ambulation. Gait is wide-based. Tandem gait was not attempted. Romberg is negative. No drift is seen.  Reflexes: Deep tendon reflexes are symmetric.   Assessment/Plan:  1. Cerebrovascular disease, left hemiparesis  2. Gait disorder  3. Memory disorder  The patient will be tapered off of the amitriptyline slowly. She will go to 20 mg at night for 4 weeks, then take 10 mg at night for 8 weeks, then stop the medication. She will contact me if she has problems with the taper. She will remain on the Aricept and Namenda. She will follow-up in 6 months, sooner if needed.  Jill Alexanders MD 01/17/2015 7:27 PM  [facility] [address] [place], [place] [address]  Phone [phone] Fax [phone]

## 2015-01-17 NOTE — Patient Instructions (Addendum)
We will slowly taper off of the amitriptyline taking 20 mg at night for 4 weeks, then 10 mg at night for 8 weeks, then stop. Stop the 25 mg amitriptyline tablet.   Fall Prevention in the Home  Falls can cause injuries and can affect people from all age groups. There are many simple things that you can do to make your home safe and to help prevent falls. WHAT CAN I DO ON THE OUTSIDE OF MY HOME?  Regularly repair the edges of walkways and driveways and fix any cracks.  Remove high doorway thresholds.  Trim any shrubbery on the main path into your home.  Use bright outdoor lighting.  Clear walkways of debris and clutter, including tools and rocks.  Regularly check that handrails are securely fastened and in good repair. Both sides of any steps should have handrails.  Install guardrails along the edges of any raised decks or porches.  Have leaves, snow, and ice cleared regularly.  Use sand or salt on walkways during winter months.  In the garage, clean up any spills right away, including grease or oil spills. WHAT CAN I DO IN THE BATHROOM?  Use night lights.  Install grab bars by the toilet and in the tub and shower. Do not use towel bars as grab bars.  Use non-skid mats or decals on the floor of the tub or shower.  If you need to sit down while you are in the shower, use a plastic, non-slip stool.[name]  Keep the floor dry. Immediately clean up any water that spills on the floor.  Remove soap buildup in the tub or shower on a regular basis.  Attach bath mats securely with double-sided non-slip rug tape.  Remove throw rugs and other tripping hazards from the floor. WHAT CAN I DO IN THE BEDROOM?  Use night lights.  Make sure that a bedside light is easy to reach.  Do not use oversized bedding that drapes onto the floor.  Have a firm chair that has side arms to use for getting dressed.  Remove throw rugs and other tripping hazards from the floor. WHAT CAN I DO IN THE  KITCHEN?   Clean up any spills right away.  Avoid walking on wet floors.  Place frequently used items in easy-to-reach places.  If you need to reach for something above you, use a sturdy step stool that has a grab bar.  Keep electrical cables out of the way.  Do not use floor polish or wax that makes floors slippery. If you have to use wax, make sure that it is non-skid floor wax.  Remove throw rugs and other tripping hazards from the floor. WHAT CAN I DO IN THE STAIRWAYS?  Do not leave any items on the stairs.  Make sure that there are handrails on both sides of the stairs. Fix handrails that are broken or loose. Make sure that handrails are as long as the stairways.  Check any carpeting to make sure that it is firmly attached to the stairs. Fix any carpet that is loose or worn.  Avoid having throw rugs at the top or bottom of stairways, or secure the rugs with carpet tape to prevent them from moving.  Make sure that you have a light switch at the top of the stairs and the bottom of the stairs. If you do not have them, have them installed. WHAT ARE SOME OTHER FALL PREVENTION TIPS?  Wear closed-toe shoes that fit well and support your feet. Wear shoes  that have rubber soles or low heels.  When you use a stepladder, make sure that it is completely opened and that the sides are firmly locked. Have someone hold the ladder while you are using it. Do not climb a closed stepladder.  Add color or contrast paint or tape to grab bars and handrails in your home. Place contrasting color strips on the first and last steps.  Use mobility aids as needed, such as canes, walkers, scooters, and crutches.  Turn on lights if it is dark. Replace any light bulbs that burn out.  Set up furniture so that there are clear paths. Keep the furniture in the same spot.  Fix any uneven floor surfaces.  Choose a carpet design that does not hide the edge of steps of a stairway.  Be aware of any and all  pets.  Review your medicines with your healthcare provider. Some medicines can cause dizziness or changes in blood pressure, which increase your risk of falling. Talk with your health care provider about other ways that you can decrease your risk of falls. This may include working with a physical therapist or trainer to improve your strength, balance, and endurance.   This information is not intended to replace advice given to you by your health care provider. Make sure you discuss any questions you have with your health care provider.   Document Released: 01/17/2002 Document Revised: 06/13/2014 Document Reviewed: 03/03/2014 Elsevier Interactive Patient Education [employer].

## 2015-01-30 ENCOUNTER — Ambulatory Visit (INDEPENDENT_AMBULATORY_CARE_PROVIDER_SITE_OTHER): Payer: Medicare Other | Admitting: *Deleted

## 2015-01-30 DIAGNOSIS — I4891 Unspecified atrial fibrillation: Secondary | ICD-10-CM | POA: Diagnosis not present

## 2015-01-30 DIAGNOSIS — I6350 Cerebral infarction due to unspecified occlusion or stenosis of unspecified cerebral artery: Secondary | ICD-10-CM | POA: Diagnosis not present

## 2015-01-30 LAB — POCT INR: INR: 2.4

## 2015-02-04 ENCOUNTER — Other Ambulatory Visit: Payer: Self-pay | Admitting: Cardiology

## 2015-02-14 ENCOUNTER — Ambulatory Visit (INDEPENDENT_AMBULATORY_CARE_PROVIDER_SITE_OTHER): Payer: Medicare Other | Admitting: Pharmacist

## 2015-02-14 ENCOUNTER — Telehealth: Payer: Self-pay | Admitting: Pharmacist

## 2015-02-14 DIAGNOSIS — I6350 Cerebral infarction due to unspecified occlusion or stenosis of unspecified cerebral artery: Secondary | ICD-10-CM

## 2015-02-14 DIAGNOSIS — I4891 Unspecified atrial fibrillation: Secondary | ICD-10-CM

## 2015-02-14 LAB — POCT INR: INR: 3.4

## 2015-02-14 NOTE — Telephone Encounter (Signed)
Pt called to report unexplained bruise on knee. Wants to have INR checked today - scheduled her for today.

## 2015-02-27 ENCOUNTER — Other Ambulatory Visit (HOSPITAL_COMMUNITY): Payer: Self-pay | Admitting: Interventional Radiology

## 2015-02-27 DIAGNOSIS — I771 Stricture of artery: Secondary | ICD-10-CM

## 2015-02-28 ENCOUNTER — Telehealth: Payer: Self-pay | Admitting: Cardiology

## 2015-02-28 NOTE — Telephone Encounter (Signed)
New Message  RN from [facility] calling for recent EF from last two visits. Please call back and discuss.

## 2015-03-01 ENCOUNTER — Telehealth (HOSPITAL_COMMUNITY): Payer: Self-pay

## 2015-03-01 NOTE — Telephone Encounter (Signed)
Called to schedule f/u US Carotid. Left voicemail for pt to call back. AW

## 2015-03-03 ENCOUNTER — Other Ambulatory Visit: Payer: Self-pay | Admitting: Neurology

## 2015-03-06 ENCOUNTER — Other Ambulatory Visit: Payer: Self-pay | Admitting: Cardiology

## 2015-03-08 ENCOUNTER — Other Ambulatory Visit: Payer: Self-pay | Admitting: Cardiology

## 2015-03-12 ENCOUNTER — Encounter: Payer: Self-pay | Admitting: Cardiology

## 2015-03-12 ENCOUNTER — Other Ambulatory Visit: Payer: Self-pay | Admitting: Cardiology

## 2015-03-12 ENCOUNTER — Ambulatory Visit (INDEPENDENT_AMBULATORY_CARE_PROVIDER_SITE_OTHER): Payer: Medicare Other | Admitting: Cardiology

## 2015-03-12 ENCOUNTER — Ambulatory Visit (INDEPENDENT_AMBULATORY_CARE_PROVIDER_SITE_OTHER): Payer: Medicare Other | Admitting: Pharmacist

## 2015-03-12 VITALS — BP 140/66 | HR 65 | Ht 66.0 in | Wt 164.8 lb

## 2015-03-12 DIAGNOSIS — I5032 Chronic diastolic (congestive) heart failure: Secondary | ICD-10-CM | POA: Diagnosis not present

## 2015-03-12 DIAGNOSIS — I4891 Unspecified atrial fibrillation: Secondary | ICD-10-CM

## 2015-03-12 DIAGNOSIS — E7800 Pure hypercholesterolemia, unspecified: Secondary | ICD-10-CM

## 2015-03-12 DIAGNOSIS — I6350 Cerebral infarction due to unspecified occlusion or stenosis of unspecified cerebral artery: Secondary | ICD-10-CM

## 2015-03-12 DIAGNOSIS — I482 Chronic atrial fibrillation, unspecified: Secondary | ICD-10-CM

## 2015-03-12 LAB — POCT INR: INR: 3

## 2015-03-12 MED ORDER — FUROSEMIDE 40 MG PO TABS: ORAL_TABLET | ORAL | Status: DC

## 2015-03-12 NOTE — Patient Instructions (Signed)
Medication Instructions:  Your physician recommends that you continue on your current medications as directed. Please refer to the Current Medication list given to you today.  Labwork: none  Testing/Procedures: none  Follow-Up: Your physician recommends that you schedule a follow-up appointment in: 3 month ov with Dr Tamala Julian   If you need a refill on your cardiac medications before your next appointment, please call your pharmacy.

## 2015-03-12 NOTE — Progress Notes (Signed)
Cardiology Office Note   Date:  03/12/2015   ID:  Amber [name], Amber [name] 06/18/1934, MRN [id number]  PCP:  Gara Kroner, MD  Cardiologist: Darlin Coco MD  Chief Complaint  Patient presents with  . routine follow up    Denies cp, sob, le edema, claudication      History of Present Illness: Amber [name] is a 81 y.o. female who presents for 3 month follow-up visit.  This pleasant 81 year old woman is seen for a scheduled followup office visit. She was hospitalized 02/12/13 until 02/19/13 for acute pneumococcal pneumonia. She has made a good recovery from the pneumonia . She has a complex past medical history. She has known ischemic heart disease with coronary artery bypass graft surgery in 2000. She has had remote stroke with residual left hemiparesis. She's had a long history of high blood pressure and high cholesterol. She has established atrial fibrillation and is on Coumadin. She has chronically elevated CK levels and she has a history of fibromyalgia. She is followed for this by Dr. Lenna Gilford, rheumatologist with [facility]. She does not tolerate statins. She's had congestive heart failure secondary to diastolic dysfunction. She was cardioverted in June 2012 but remained in sinus rhythm for less than one week. She is now in established chronic atrial fibrillation on Coumadin.  The patient has a history of congestive heart failure. This is chronic diastolic heart failure with preserved ejection fraction. She is doing well on furosemide 80 mg twice a day.  Recently because she was doing better she cut her Lasix down to just 40 mg in the evening.  Her peripheral edema has not recurred and her breathing has been good and her weight is down 3 pounds. She is on potassium 20 mEq 3 times a day and has felt better since we put her up to her present dose. She is doing well on high-dose Lasix with preservation of renal function. previously we had switched her from Diovan to  losartan because of cost . However, she did not do as well and we switched her back to Diovan and she feels better . The patient still has very little stamina when she walks.  The patient had an echocardiogram on 08/12/12 showing an ejection fraction of 60-65%. She also had a Lexa scan Myoview on 08/25/12 which showed no evidence of ischemia and she does have an old inferior wall myocardial infarction scar. She had a repeat Lexi scan Myoview on 03/23/13 which showed findings similar to July 2014. There is inferior wall scar with surrounding peri-infarct ischemia.  Since last visit the patient has had no new cardiac symptoms. She has persistently high cholesterol levels and is intolerant of all statin drugs as well as ezetimibe. She was considered for one of the drug studies with the PCS K9 injections but was not qualified for it because of her high CK levels. she has a history of an old stroke. She has an Area of intracranial stenosis followed by Dr. Syble Creek. Her previous neurologist was Dr. Erling Cruz. Dr. Floyde Parkins is her new neurologist. He placed her on Namenda for her memory problem and she notes a distinct improvement since Namenda was added to her regimen.  He is also tapering her off her amitriptyline. The patient denies any chest discomfort. She is not having any increased shortness of breath. She is not having any increased peripheral edema. She has reduced her Celebrex to just once a day. She has a new brace on her left foot  which is working better. She has a history of a stenotic vessel in her brain. She was scheduled to have this intervened upon but when last checked this stenosis had dropped from 70% to 60% and so the intervention was canceled. The patient feels that her memory has improved since she was placed on Namenda. She reports that her appetite is only fair. Her weight has been stable however. She generally eats one meal per day at supper. Since last visit she has felt better.   No chest pain.  Breathing is improved and she has been able to cut back on her furosemide.   Past Medical History  Diagnosis Date  . Coronary artery disease     a. s/p CABG;  b. 04/2010 Neg MV  . Hypertension   . Hyperlipidemia   . Hypothyroidism   . Depression   . Insomnia   . Fibromyalgia   . Gout   . Osteoarthritis   . Osteoporosis   . Cervical spinal stenosis   . Nasal polyposis   . Pneumonia     1990  . COPD with emphysema ([place])     PFT 05/02/10>>FEV1 1.35(62%), FEV1% 66, DLCO 75%  . Chronic diastolic CHF (congestive heart failure) ([place])     a. 02/2013 Echo: EF 55-60%, mild AI, mod dil LA.  [name] Atrial fibrillation ([place])     a. chronic/rate controlled;  b. chronic coumadin.  . CVA (cerebral vascular accident) ([place])     left sided weakness  . GERD (gastroesophageal reflux disease)     pepcid 2-3 times per week  . Intermittent confusion   . History of melanoma     squamous cell, melanoma  . Memory change 01/23/2014  . Dementia   . Abnormality of gait 08/02/2014  . Hemiparesis and alteration of sensations as late effects of stroke ([place]) 01/17/2015    Past Surgical History  Procedure Laterality Date  . Abdominal hysterectomy    . Breast lumpectomy  1980s    Benign lesion - right  . Carpel tunnel      right  . Tonsillectomy    . Eye surgery      bilateral cataracts  . Coronary artery bypass graft  2000  . Radiology with anesthesia  12/24/2011    Procedure: RADIOLOGY WITH ANESTHESIA;  Surgeon: Medication Radiologist, MD;  Location: [facility];  Service: Radiology;  Laterality: N/A;  Extra Cranial Vascular Stent  . Radiology with anesthesia N/A 07/19/2014    Procedure: ANGIOPLASTY;  Surgeon: Luanne Bras, MD;  Location: [facility];  Service: Radiology;  Laterality: N/A;  . Radiology with anesthesia N/A 07/27/2014    Procedure: ANGIOPLASTY;  Surgeon: Luanne Bras, MD;  Location: [facility];  Service: Radiology;  Laterality: N/A;     Current Outpatient Prescriptions    Medication Sig Dispense Refill  . alendronate (FOSAMAX) 70 MG tablet Take 70 mg by mouth every Sunday. Take with a full glass of water on an empty stomach.    [name] allopurinol (ZYLOPRIM) 300 MG tablet Take 300 mg by mouth every other day.    [name] amitriptyline (ELAVIL) 10 MG tablet Take 10 mg by mouth at bedtime.    [name] amLODipine (NORVASC) 5 MG tablet Take 5 mg by mouth at bedtime.    . Calcium Carbonate-Vitamin D (CALCIUM + D PO) Take 1 tablet by mouth daily.    . celecoxib (CELEBREX) 200 MG capsule Take 200 mg by mouth 2 (two) times daily.     . citalopram (CELEXA) 20 MG tablet  TAKE 1 TABLET BY MOUTH TWICE A DAY 60 tablet 11  . clopidogrel (PLAVIX) 75 MG tablet TAKE 1 TABLET BY MOUTH EVERY DAY 90 tablet 1  . Coenzyme Q10 (CO Q-10 PO) Take 1 capsule by mouth daily.    [name] COUMADIN 5 MG tablet TAKE 1 TABLET (5 MG TOTAL) BY MOUTH AS DIRECTED. TAKE AS DIRECTED BY THE COUMADIN CLINIC 135 tablet 1  . digoxin (LANOXIN) 0.125 MG tablet Take 0.125 mg by mouth daily.    [name] donepezil (ARICEPT) 5 MG tablet TAKE 1 TABLET (5 MG TOTAL) BY MOUTH DAILY. 90 tablet 0  . ergocalciferol (VITAMIN D2) 50000 UNITS capsule Take 50,000 Units by mouth 2 (two) times a week. Sunday and Wednesday    . furosemide (LASIX) 80 MG tablet Take 80 mg by mouth daily. In the morning    . levothyroxine (SYNTHROID, LEVOTHROID) 25 MCG tablet Take 25 mcg by mouth daily.    . metoprolol succinate (TOPROL-XL) 25 MG 24 hr tablet Take 25 mg by mouth daily.    . Multiple Vitamins-Minerals (MULTIVITAMIN WITH MINERALS) tablet Take 1 tablet by mouth daily.    [name] NAMENDA XR 28 MG CP24 24 hr capsule TAKE ONE CAPSULE BY MOUTH EVERY DAY 30 capsule 6  . nitroGLYCERIN (NITROSTAT) 0.4 MG SL tablet Place 1 tablet (0.4 mg total) under the tongue every 5 (five) minutes as needed for chest pain. 25 tablet 3  . Polyethyl Glycol-Propyl Glycol (SYSTANE OP) Place 1 drop into both eyes daily.     . potassium chloride SA (KLOR-CON M20) 20 MEQ tablet Take 1 tablet (20  mEq total) by mouth 3 (three) times daily. 270 tablet 3  . PROAIR HFA 108 (90 BASE) MCG/ACT inhaler INHALE 2 PUFFS INTO LUNGS EVERY 6 HOURS AS NEEDED FOR WHEEZING 8.5 each 0  . valsartan (DIOVAN) 40 MG tablet TAKE 1 TABLET BY MOUTH TWICE A DAY 180 tablet 3  . furosemide (LASIX) 40 MG tablet Take one tablet by mouth in the evening. (to be taken in addition to the 80 mg in the am) 90 tablet 3   No current facility-administered medications for this visit.    Allergies:   Ezetimibe; Welchol; Adhesive; Ceclor; Elastic bandages &; Latex; Lipitor; Mevacor; Pravachol; and Vasotec    Social History:  The patient  reports that she quit smoking about 38 years ago. Her smoking use included Cigarettes. She has a 37.5 pack-year smoking history. She has never used smokeless tobacco. She reports that she does not drink alcohol or use illicit drugs.   Family History:  The patient's family history includes CAD in her brother and father; Diabetes type II in her brother; Stroke in her mother.    ROS:  Please see the history of present illness.   Otherwise, review of systems are positive for none.   All other systems are reviewed and negative.    PHYSICAL EXAM: VS:  BP 140/66 mmHg  Pulse 65  Ht 5\' 6"  (1.676 m)  Wt 164 lb 12.8 oz (74.753 kg)  BMI 26.61 kg/m2 , BMI Body mass index is 26.61 kg/(m^2). GEN: Well nourished, well developed, in no acute distress HEENT: normal Neck: no JVD, carotid bruits, or masses Cardiac: RRR; no murmurs, rubs, or gallops,no edema  Respiratory:  clear to auscultation bilaterally, normal work of breathing GI: soft, nontender, nondistended, + BS MS: no deformity or atrophy Skin: warm and dry, no rash Neuro:  Strength and sensation are intact Psych: euthymic mood, full affect   EKG:  EKG is not ordered today.    Recent Labs: 12/11/2014: ALT 16; BUN 15; Creat 0.86; Hemoglobin 14.5; Platelets 249; Potassium 3.9; Sodium 138; TSH 0.866    Lipid Panel    Component Value  Date/Time   CHOL 392* 12/11/2014 1644   TRIG 373* 12/11/2014 1644   HDL 44* 12/11/2014 1644   CHOLHDL 8.9* 12/11/2014 1644   VLDL 75* 12/11/2014 1644   LDLCALC 273* 12/11/2014 1644   LDLDIRECT 236.0 08/02/2014 1418      Wt Readings from Last 3 Encounters:  03/12/15 164 lb 12.8 oz (74.753 kg)  01/17/15 167 lb 8 oz (75.978 kg)  12/11/14 165 lb (74.844 kg)        ASSESSMENT AND PLAN:  1. ischemic heart disease with CABG in 2000 2. history of CVA in 2001 3. permanent atrial fibrillation on Coumadin 4. dyslipidemia with intolerance to statins and other cholesterol lowering medicine 5. history of fibromyalgia and chronically elevated CK levels 6. Hypothyroidism 7. chronic diastolic heart failure with ejection fraction of 60-65% 8. COPD 9. mild memory impairment, improved since the addition of Namenda and tapering of amitriptyline   Current medicines are reviewed at length with the patient today.  The patient does not have concerns regarding medicines.  The following changes have been made:  no change  Labs/ tests ordered today include:  No orders of the defined types were placed in this encounter.     Disposition: Continue current medication.  Return in 3 months for follow-up office visit with Dr. Daneen Schick.  Berna Spare MD 03/12/2015 5:58 PM    [address], [place], [place]  [address] Phone: [phone]; Fax: [phone]

## 2015-03-14 ENCOUNTER — Encounter (HOSPITAL_COMMUNITY): Payer: Medicare Other

## 2015-03-19 ENCOUNTER — Ambulatory Visit (HOSPITAL_COMMUNITY)
Admission: RE | Admit: 2015-03-19 | Discharge: 2015-03-19 | Disposition: A | Discharge status: Home / Self Care | Payer: Medicare Other | Source: Ambulatory Visit | Attending: Interventional Radiology | Admitting: Interventional Radiology

## 2015-03-19 DIAGNOSIS — I771 Stricture of artery: Secondary | ICD-10-CM | POA: Insufficient documentation

## 2015-03-19 NOTE — Progress Notes (Signed)
*  PRELIMINARY RESULTS* Vascular Ultrasound Carotid Duplex has been completed.  Preliminary findings: Right = 1-39% ICA stenosis, upper end of scale. Left = 40-59% stenosis, lower end of scale. Right vertebral was not visualized. Left vertebral patent with antegrade flow.   [name] Mellow, RDMS, RVT  03/19/2015, 3:54 PM

## 2015-03-25 ENCOUNTER — Other Ambulatory Visit: Payer: Self-pay | Admitting: Cardiology

## 2015-04-05 ENCOUNTER — Ambulatory Visit (INDEPENDENT_AMBULATORY_CARE_PROVIDER_SITE_OTHER): Payer: Medicare Other | Admitting: *Deleted

## 2015-04-05 DIAGNOSIS — I4891 Unspecified atrial fibrillation: Secondary | ICD-10-CM

## 2015-04-05 DIAGNOSIS — I6350 Cerebral infarction due to unspecified occlusion or stenosis of unspecified cerebral artery: Secondary | ICD-10-CM

## 2015-04-05 LAB — POCT INR: INR: 2.6

## 2015-04-12 ENCOUNTER — Other Ambulatory Visit: Payer: Self-pay | Admitting: Neurology

## 2015-04-22 ENCOUNTER — Other Ambulatory Visit: Payer: Self-pay | Admitting: Cardiology

## 2015-05-03 ENCOUNTER — Ambulatory Visit (INDEPENDENT_AMBULATORY_CARE_PROVIDER_SITE_OTHER): Payer: Medicare Other | Admitting: *Deleted

## 2015-05-03 DIAGNOSIS — I6350 Cerebral infarction due to unspecified occlusion or stenosis of unspecified cerebral artery: Secondary | ICD-10-CM

## 2015-05-03 DIAGNOSIS — I4891 Unspecified atrial fibrillation: Secondary | ICD-10-CM

## 2015-05-03 LAB — POCT INR: INR: 3.1

## 2015-05-24 ENCOUNTER — Ambulatory Visit (INDEPENDENT_AMBULATORY_CARE_PROVIDER_SITE_OTHER): Payer: Medicare Other | Admitting: *Deleted

## 2015-05-24 DIAGNOSIS — I4891 Unspecified atrial fibrillation: Secondary | ICD-10-CM | POA: Diagnosis not present

## 2015-05-24 DIAGNOSIS — I6350 Cerebral infarction due to unspecified occlusion or stenosis of unspecified cerebral artery: Secondary | ICD-10-CM | POA: Diagnosis not present

## 2015-05-24 LAB — POCT INR: INR: 4.1

## 2015-05-28 ENCOUNTER — Other Ambulatory Visit: Payer: Self-pay

## 2015-05-28 MED ORDER — CLOPIDOGREL BISULFATE 75 MG PO TABS: 75.0000 mg | ORAL_TABLET | Freq: Every day | ORAL | Status: DC

## 2015-05-29 ENCOUNTER — Telehealth: Payer: Self-pay | Admitting: Neurology

## 2015-05-29 NOTE — Telephone Encounter (Signed)
Called pt back and spoke to son who said she was unavailable. He will have her call back. Last OV (Dec 2016) notes, "The patient will be tapered off of the amitriptyline slowly. She will go to 20 mg at night for 4 weeks, then take 10 mg at night for 8 weeks, then stop the medication. She will contact me if she has problems with the taper." However, external pharmacy ([facility]) shows that Amitryptyline 25 mg qhs #30 last dispensed 05/10/15. Pt reported that she stopped med about 1 mo ago and is having insomnia. Will await pt call-back to discuss further.

## 2015-05-29 NOTE — Telephone Encounter (Signed)
I called, left a message, I'll try to call back later.

## 2015-05-29 NOTE — Telephone Encounter (Signed)
Patient called, states she stopped AMITRIPTYLINE approximately 1 month ago and can't sleep.

## 2015-05-30 NOTE — Telephone Encounter (Signed)
I called patient. The patient has not been able to sleep well since coming off of the amitriptyline. She is to go back on the 10 mg dose and stay on that dose until I see her again.

## 2015-06-04 ENCOUNTER — Other Ambulatory Visit: Payer: Self-pay

## 2015-06-04 MED ORDER — METOPROLOL SUCCINATE ER 25 MG PO TB24: 25.0000 mg | ORAL_TABLET | Freq: Every day | ORAL | Status: DC

## 2015-06-05 ENCOUNTER — Other Ambulatory Visit: Payer: Self-pay | Admitting: Neurology

## 2015-06-06 ENCOUNTER — Other Ambulatory Visit: Payer: Self-pay | Admitting: Neurology

## 2015-06-08 ENCOUNTER — Ambulatory Visit (INDEPENDENT_AMBULATORY_CARE_PROVIDER_SITE_OTHER): Payer: Medicare Other | Admitting: Interventional Cardiology

## 2015-06-08 ENCOUNTER — Ambulatory Visit (INDEPENDENT_AMBULATORY_CARE_PROVIDER_SITE_OTHER): Payer: Medicare Other | Admitting: *Deleted

## 2015-06-08 ENCOUNTER — Encounter: Payer: Self-pay | Admitting: Interventional Cardiology

## 2015-06-08 VITALS — BP 150/80 | HR 43 | Ht 66.0 in | Wt 158.8 lb

## 2015-06-08 DIAGNOSIS — R413 Other amnesia: Secondary | ICD-10-CM

## 2015-06-08 DIAGNOSIS — I6350 Cerebral infarction due to unspecified occlusion or stenosis of unspecified cerebral artery: Secondary | ICD-10-CM | POA: Diagnosis not present

## 2015-06-08 DIAGNOSIS — I482 Chronic atrial fibrillation, unspecified: Secondary | ICD-10-CM

## 2015-06-08 DIAGNOSIS — E785 Hyperlipidemia, unspecified: Secondary | ICD-10-CM

## 2015-06-08 DIAGNOSIS — I2510 Atherosclerotic heart disease of native coronary artery without angina pectoris: Secondary | ICD-10-CM

## 2015-06-08 DIAGNOSIS — I5032 Chronic diastolic (congestive) heart failure: Secondary | ICD-10-CM

## 2015-06-08 DIAGNOSIS — I119 Hypertensive heart disease without heart failure: Secondary | ICD-10-CM

## 2015-06-08 DIAGNOSIS — I4891 Unspecified atrial fibrillation: Secondary | ICD-10-CM | POA: Diagnosis not present

## 2015-06-08 LAB — POCT INR: INR: 2.5

## 2015-06-08 MED ORDER — DIGOXIN 125 MCG PO TABS: ORAL_TABLET | ORAL | Status: DC

## 2015-06-08 NOTE — Progress Notes (Signed)
Cardiology Office Note   Date:  06/08/2015   ID:  [name], Amber Sep 16, 1934, MRN [id number]  PCP:  Gara Kroner, MD  Cardiologist:  Sinclair Grooms, MD   Chief Complaint  Patient presents with  . Atrial Fibrillation      History of Present Illness: Amber [name] is a 81 y.o. female who presents New to me, and a former patient of Dr. Warren [name], for follow-up ischemic heart disease with coronary artery bypass graft surgery in 2000. She has had remote stroke with residual left hemiparesis. She's had a long history of high blood pressure and high cholesterol. She has established atrial fibrillation and is on Coumadin. There is a history of fibromyalgia with chronic elevation in CPK. She has chronic diastolic heart failure with a recently documented LVEF greater than 55%.  Decreased appetite and some vision disturbance not specifically described as halos. Breathing is normal and no chest pain. No peripheral edema. Weight is been stable  Past Medical History  Diagnosis Date  . Coronary artery disease     a. s/p CABG;  b. 04/2010 Neg MV  . Hypertension   . Hyperlipidemia   . Hypothyroidism   . Depression   . Insomnia   . Fibromyalgia   . Gout   . Osteoarthritis   . Osteoporosis   . Cervical spinal stenosis   . Nasal polyposis   . Pneumonia     1990  . COPD with emphysema ([place])     PFT 05/02/10>>FEV1 1.35(62%), FEV1% 66, DLCO 75%  . Chronic diastolic CHF (congestive heart failure) ([place])     a. 02/2013 Echo: EF 55-60%, mild AI, mod dil LA.  [name] Atrial fibrillation ([place])     a. chronic/rate controlled;  b. chronic coumadin.  . CVA (cerebral vascular accident) ([place])     left sided weakness  . GERD (gastroesophageal reflux disease)     pepcid 2-3 times per week  . Intermittent confusion   . History of melanoma     squamous cell, melanoma  . Memory change 01/23/2014  . Dementia   . Abnormality of gait 08/02/2014  . Hemiparesis and alteration of sensations as late  effects of stroke ([place]) 01/17/2015    Past Surgical History  Procedure Laterality Date  . Abdominal hysterectomy    . Breast lumpectomy  1980s    Benign lesion - right  . Carpel tunnel      right  . Tonsillectomy    . Eye surgery      bilateral cataracts  . Coronary artery bypass graft  2000  . Radiology with anesthesia  12/24/2011    Procedure: RADIOLOGY WITH ANESTHESIA;  Surgeon: Medication Radiologist, MD;  Location: [facility];  Service: Radiology;  Laterality: N/A;  Extra Cranial Vascular Stent  . Radiology with anesthesia N/A 07/19/2014    Procedure: ANGIOPLASTY;  Surgeon: Luanne Bras, MD;  Location: [facility];  Service: Radiology;  Laterality: N/A;  . Radiology with anesthesia N/A 07/27/2014    Procedure: ANGIOPLASTY;  Surgeon: Luanne Bras, MD;  Location: [facility];  Service: Radiology;  Laterality: N/A;     Current Outpatient Prescriptions  Medication Sig Dispense Refill  . alendronate (FOSAMAX) 70 MG tablet Take 70 mg by mouth every Sunday. Take with a full glass of water on an empty stomach.    [name] allopurinol (ZYLOPRIM) 300 MG tablet Take 300 mg by mouth every other day.    [name] amitriptyline (ELAVIL) 10 MG tablet Take 10 mg by mouth  at bedtime.    [name] amLODipine (NORVASC) 5 MG tablet Take 5 mg by mouth at bedtime.    . Calcium Carbonate-Vitamin D (CALCIUM + D PO) Take 1 tablet by mouth daily.    . celecoxib (CELEBREX) 200 MG capsule Take 200 mg by mouth 2 (two) times daily.     . citalopram (CELEXA) 20 MG tablet TAKE 1 TABLET BY MOUTH TWICE A DAY 60 tablet 11  . clopidogrel (PLAVIX) 75 MG tablet Take 1 tablet (75 mg total) by mouth daily. 90 tablet 0  . Coenzyme Q10 (CO Q-10 PO) Take 1 capsule by mouth daily.    [name] COUMADIN 5 MG tablet TAKE 1 TABLET (5 MG TOTAL) BY MOUTH AS DIRECTED. TAKE AS DIRECTED BY THE COUMADIN CLINIC 135 tablet 0  . donepezil (ARICEPT) 5 MG tablet TAKE 1 TABLET (5 MG TOTAL) BY MOUTH DAILY. 90 tablet 1  . ergocalciferol (VITAMIN D2) 50000 UNITS capsule Take  50,000 Units by mouth 2 (two) times a week. Sunday and Wednesday    . furosemide (LASIX) 40 MG tablet Take one tablet by mouth in the evening. (to be taken in addition to the 80 mg in the am) 90 tablet 3  . furosemide (LASIX) 80 MG tablet Take 80 mg by mouth daily. In the morning    . furosemide (LASIX) 80 MG tablet TAKE 1 TABLET BY MOUTH TWICE A DAY 60 tablet 1  . levothyroxine (SYNTHROID, LEVOTHROID) 25 MCG tablet Take 25 mcg by mouth daily.    . metoprolol succinate (TOPROL-XL) 25 MG 24 hr tablet Take 1 tablet (25 mg total) by mouth daily. 90 tablet 0  . Multiple Vitamins-Minerals (MULTIVITAMIN WITH MINERALS) tablet Take 1 tablet by mouth daily.    [name] NAMENDA XR 28 MG CP24 24 hr capsule TAKE ONE CAPSULE BY MOUTH EVERY DAY 30 capsule 6  . nitroGLYCERIN (NITROSTAT) 0.4 MG SL tablet Place 1 tablet (0.4 mg total) under the tongue every 5 (five) minutes as needed for chest pain. 25 tablet 3  . Polyethyl Glycol-Propyl Glycol (SYSTANE OP) Place 1 drop into both eyes daily.     . potassium chloride SA (KLOR-CON M20) 20 MEQ tablet Take 1 tablet (20 mEq total) by mouth 3 (three) times daily. 270 tablet 3  . PROAIR HFA 108 (90 BASE) MCG/ACT inhaler INHALE 2 PUFFS INTO LUNGS EVERY 6 HOURS AS NEEDED FOR WHEEZING 8.5 each 0  . valsartan (DIOVAN) 40 MG tablet TAKE 1 TABLET BY MOUTH TWICE A DAY 180 tablet 3  . digoxin (LANOXIN) 0.125 MG tablet Take 1 tablet (0.125 mg total) by mouth on Mondays, Wednesdays, & Fridays 45 tablet 3   No current facility-administered medications for this visit.    Allergies:   Ezetimibe; Welchol; Adhesive; Ceclor; Elastic bandages &; Latex; Lipitor; Mevacor; Pravachol; and Vasotec    Social History:  The patient  reports that she quit smoking about 38 years ago. Her smoking use included Cigarettes. She has a 37.5 pack-year smoking history. She has never used smokeless tobacco. She reports that she does not drink alcohol or use illicit drugs.   Family History:  The patient's  family history includes CAD in her brother and father; Diabetes type II in her brother; Stroke in her mother.    ROS:  Please see the history of present illness.   Otherwise, review of systems are positive for Anxiety, difficulty with balance, easy bruising, vision disturbance, poor appetite, and hearing loss..   All other systems are reviewed and negative.  PHYSICAL EXAM: VS:  BP 150/80 mmHg  Pulse 43  Ht 5\' 6"  (1.676 m)  Wt 158 lb 12.8 oz (72.031 kg)  BMI 25.64 kg/m2 , BMI Body mass index is 25.64 kg/(m^2). GEN: Well nourished, well developed, in no acute distress HEENT: normal Neck: no JVD, carotid bruits, or masses Cardiac: IIRR.  There is no murmur, rub, or gallop. There is no edema. Respiratory:  clear to auscultation bilaterally, normal work of breathing. GI: soft, nontender, nondistended, + BS MS: no deformity or atrophy Skin: warm and dry, no rash Neuro:  Strength and sensation are intact Psych: euthymic mood, full affect   EKG:  EKG is ordered today. The ekg reveals atrial fib with marked slowing of ventricular response and secondary ST abnormality. Ventricular rate 43 bpm.   Recent Labs: 12/11/2014: ALT 16; BUN 15; Creat 0.86; Hemoglobin 14.5; Platelets 249; Potassium 3.9; Sodium 138; TSH 0.866    Lipid Panel    Component Value Date/Time   CHOL 392* 12/11/2014 1644   TRIG 373* 12/11/2014 1644   HDL 44* 12/11/2014 1644   CHOLHDL 8.9* 12/11/2014 1644   VLDL 75* 12/11/2014 1644   LDLCALC 273* 12/11/2014 1644   LDLDIRECT 236.0 08/02/2014 1418      Wt Readings from Last 3 Encounters:  06/08/15 158 lb 12.8 oz (72.031 kg)  03/12/15 164 lb 12.8 oz (74.753 kg)  01/17/15 167 lb 8 oz (75.978 kg)      Other studies Reviewed: Additional studies/ records that were reviewed today include: The prior note by Dr. Mare Ferrari. The findings include assessed problem list.    ASSESSMENT AND PLAN:  1. Chronic atrial fibrillation (HCC) Slow ventricular response related  to age, medications,  2. Coronary artery disease involving native coronary artery of native heart without angina pectoris Asymptomatic  3. Dyslipidemia Followed by primary care  4. Chronic diastolic heart failure (HCC) No evidence of volume overload  5. Memory changes No changes  6. Benign hypertensive heart disease without heart failure Controlled  7. Slow ventricular response to atrial fibrillation  Current medicines are reviewed at length with the patient today.  The patient has the following concerns regarding medicines: None.  The following changes/actions have been instituted:    With bradycardia, vision disturbance, and anorexia, and concerned about dig toxicity and will decrease to .125 mg on Monday Wednesday and Friday.  Wants to be seen every 4 months even if she feels okay and therefore we may need to have an APP help with her management.  Labs/ tests ordered today include:   Orders Placed This Encounter  Procedures  . EKG 12-Lead     Disposition:   FU with HS in 4 months  Signed, Sinclair Grooms, MD  06/08/2015 12:34 PM    [address], [place], [place]  [address] Phone: [phone]; Fax: [phone]

## 2015-06-08 NOTE — Patient Instructions (Addendum)
Medication Instructions:  Your physician has recommended you make the following change in your medication:  1) DECREASE digoxin to 0.125 mg only on Monday's, Wednesday's and Friday's.  Labwork: None ordered  Testing/Procedures: None  ordered  Follow-Up: Your physician recommends that you schedule a follow-up appointment in: 4-6 months with Dr. Tamala Julian   Any Other Special Instructions Will Be Listed Below (If Applicable). -- Please call the office in 3-4 weeks to let us know is your vision and appetite have improved. --  -- Stop Smoking --  If you need a refill on your cardiac medications before your next appointment, please call your pharmacy.  Thank you for choosing [facility]!!

## 2015-06-19 ENCOUNTER — Other Ambulatory Visit: Payer: Self-pay | Admitting: *Deleted

## 2015-06-19 MED ORDER — POTASSIUM CHLORIDE CRYS ER 20 MEQ PO TBCR: 20.0000 meq | EXTENDED_RELEASE_TABLET | Freq: Three times a day (TID) | ORAL | Status: DC

## 2015-06-19 MED ORDER — FUROSEMIDE 80 MG PO TABS: 80.0000 mg | ORAL_TABLET | Freq: Two times a day (BID) | ORAL | Status: DC

## 2015-06-22 ENCOUNTER — Other Ambulatory Visit: Payer: Self-pay

## 2015-06-22 MED ORDER — AMLODIPINE BESYLATE 5 MG PO TABS: 5.0000 mg | ORAL_TABLET | Freq: Every day | ORAL | Status: DC

## 2015-06-22 MED ORDER — AMITRIPTYLINE HCL 10 MG PO TABS: 10.0000 mg | ORAL_TABLET | Freq: Every day | ORAL | Status: DC

## 2015-06-22 NOTE — Telephone Encounter (Signed)
Amber Crome, MD at 06/08/2015 11:39 AM  amLODipine (NORVASC) 5 MG tabletTake 5 mg by mouth at bedtime Current medicines are reviewed at length with the patient today. The patient has the following concerns regarding medicines: None   Patient Instructions     Medication Instructions:  Your physician has recommended you make the following change in your medication:  1) DECREASE digoxin to 0.125 mg only on Monday's, Wednesday's and Friday's.

## 2015-06-22 NOTE — Telephone Encounter (Signed)
Received faxed request for 90 day refill on amitriptyline 25 mg. However, pt previously instructed by Dr. Jannifer Franklin to take 10 mg qhs which was retailed as ordered.

## 2015-06-28 ENCOUNTER — Other Ambulatory Visit: Payer: Self-pay

## 2015-06-28 MED ORDER — FUROSEMIDE 80 MG PO TABS: 80.0000 mg | ORAL_TABLET | Freq: Two times a day (BID) | ORAL | Status: DC

## 2015-06-29 ENCOUNTER — Ambulatory Visit (INDEPENDENT_AMBULATORY_CARE_PROVIDER_SITE_OTHER): Payer: Medicare Other | Admitting: *Deleted

## 2015-06-29 DIAGNOSIS — I4891 Unspecified atrial fibrillation: Secondary | ICD-10-CM

## 2015-06-29 DIAGNOSIS — I6350 Cerebral infarction due to unspecified occlusion or stenosis of unspecified cerebral artery: Secondary | ICD-10-CM

## 2015-06-29 LAB — POCT INR: INR: 2

## 2015-07-03 ENCOUNTER — Other Ambulatory Visit: Payer: Self-pay

## 2015-07-03 MED ORDER — AMLODIPINE BESYLATE 5 MG PO TABS: 5.0000 mg | ORAL_TABLET | Freq: Every day | ORAL | Status: DC

## 2015-07-04 ENCOUNTER — Other Ambulatory Visit: Payer: Self-pay | Admitting: *Deleted

## 2015-07-04 MED ORDER — COUMADIN 5 MG PO TABS: ORAL_TABLET | ORAL | Status: DC

## 2015-07-04 NOTE — Telephone Encounter (Signed)
Refill done as requested 

## 2015-07-18 ENCOUNTER — Ambulatory Visit: Payer: Medicare Other | Admitting: Adult Health

## 2015-07-27 ENCOUNTER — Ambulatory Visit (INDEPENDENT_AMBULATORY_CARE_PROVIDER_SITE_OTHER): Payer: Medicare Other | Admitting: *Deleted

## 2015-07-27 DIAGNOSIS — I4891 Unspecified atrial fibrillation: Secondary | ICD-10-CM | POA: Diagnosis not present

## 2015-07-27 DIAGNOSIS — I6350 Cerebral infarction due to unspecified occlusion or stenosis of unspecified cerebral artery: Secondary | ICD-10-CM | POA: Diagnosis not present

## 2015-07-27 LAB — POCT INR: INR: 2.2

## 2015-08-27 ENCOUNTER — Other Ambulatory Visit: Payer: Self-pay

## 2015-08-27 MED ORDER — CLOPIDOGREL BISULFATE 75 MG PO TABS
75.0000 mg | ORAL_TABLET | Freq: Every day | ORAL | Status: DC
Start: 2015-08-27 — End: 2016-02-20

## 2015-08-28 ENCOUNTER — Other Ambulatory Visit: Payer: Self-pay | Admitting: Interventional Cardiology

## 2015-08-30 ENCOUNTER — Telehealth: Payer: Self-pay

## 2015-08-30 MED ORDER — MEMANTINE HCL ER 28 MG PO CP24: 28.0000 mg | ORAL_CAPSULE | Freq: Every day | ORAL | Status: DC

## 2015-08-30 NOTE — Telephone Encounter (Signed)
Refill request. Next appt next week

## 2015-09-07 ENCOUNTER — Encounter: Payer: Self-pay | Admitting: Neurology

## 2015-09-07 ENCOUNTER — Ambulatory Visit (INDEPENDENT_AMBULATORY_CARE_PROVIDER_SITE_OTHER): Payer: Medicare Other | Admitting: *Deleted

## 2015-09-07 ENCOUNTER — Ambulatory Visit (INDEPENDENT_AMBULATORY_CARE_PROVIDER_SITE_OTHER): Payer: Medicare Other | Admitting: Neurology

## 2015-09-07 VITALS — BP 142/84 | HR 74 | Ht 65.0 in | Wt 169.5 lb

## 2015-09-07 DIAGNOSIS — I6350 Cerebral infarction due to unspecified occlusion or stenosis of unspecified cerebral artery: Secondary | ICD-10-CM

## 2015-09-07 DIAGNOSIS — I69398 Other sequelae of cerebral infarction: Secondary | ICD-10-CM | POA: Diagnosis not present

## 2015-09-07 DIAGNOSIS — I4891 Unspecified atrial fibrillation: Secondary | ICD-10-CM

## 2015-09-07 DIAGNOSIS — R413 Other amnesia: Secondary | ICD-10-CM

## 2015-09-07 DIAGNOSIS — R269 Unspecified abnormalities of gait and mobility: Secondary | ICD-10-CM

## 2015-09-07 DIAGNOSIS — I69359 Hemiplegia and hemiparesis following cerebral infarction affecting unspecified side: Secondary | ICD-10-CM

## 2015-09-07 LAB — POCT INR: INR: 2.3

## 2015-09-07 MED ORDER — MEMANTINE HCL ER 28 MG PO CP24: 28.0000 mg | ORAL_CAPSULE | Freq: Every day | ORAL | 3 refills | Status: DC

## 2015-09-07 MED ORDER — AMITRIPTYLINE HCL 10 MG PO TABS: 20.0000 mg | ORAL_TABLET | Freq: Every day | ORAL | 3 refills | Status: DC

## 2015-09-07 NOTE — Progress Notes (Signed)
Reason for visit: Memory disorder  Amber [name] is an 81 y.o. female  History of present illness:  Amber [name] is a 81 year old right-handed white female with a history of cerebrovascular disease with a prior pontine stroke with a left hemiparesis. The patient has a gait disorder associated with this. She has fallen on 2 occasions within the last month. One fall occurred while she was in the kitchen putting dishes into the dishwasher, she turned to the left, and then fell over. The other fall occurred when her grandson was on her walker, he suddenly jumped off the walker, and then she fell backwards. Currently she is on anticoagulant therapy. The patient did not bump her head with either fall. The patient is doing well with her memory, no real changes have been noted. She remains on Aricept and Namenda. She has tried to cut back on the amitriptyline, she is unable to sleep well even on 10 mg of amitriptyline at night. She returns to this office for an evaluation. Her memory issues are mainly associated with difficulty with word finding.  Past Medical History:  Diagnosis Date  . Abnormality of gait 08/02/2014  . Atrial fibrillation ([place])    a. chronic/rate controlled;  b. chronic coumadin.  . Cervical spinal stenosis   . Chronic diastolic CHF (congestive heart failure) ([place])    a. 02/2013 Echo: EF 55-60%, mild AI, mod dil LA.  [name] COPD with emphysema ([place])    PFT 05/02/10>>FEV1 1.35(62%), FEV1% 66, DLCO 75%  . Coronary artery disease    a. s/p CABG;  b. 04/2010 Neg MV  . CVA (cerebral vascular accident) ([place])    left sided weakness  . Dementia   . Depression   . Fibromyalgia   . GERD (gastroesophageal reflux disease)    pepcid 2-3 times per week  . Gout   . Hemiparesis and alteration of sensations as late effects of stroke ([place]) 01/17/2015  . History of melanoma    squamous cell, melanoma  . Hyperlipidemia   . Hypertension   . Hypothyroidism   . Insomnia   . Intermittent confusion     . Memory change 01/23/2014  . Nasal polyposis   . Osteoarthritis   . Osteoporosis   . Pneumonia    1990    Past Surgical History:  Procedure Laterality Date  . ABDOMINAL HYSTERECTOMY    . BREAST LUMPECTOMY  1980s   Benign lesion - right  . carpel tunnel     right  . CORONARY ARTERY BYPASS GRAFT  2000  . EYE SURGERY     bilateral cataracts  . RADIOLOGY WITH ANESTHESIA  12/24/2011   Procedure: RADIOLOGY WITH ANESTHESIA;  Surgeon: Medication Radiologist, MD;  Location: [facility];  Service: Radiology;  Laterality: N/A;  Extra Cranial Vascular Stent  . RADIOLOGY WITH ANESTHESIA N/A 07/19/2014   Procedure: ANGIOPLASTY;  Surgeon: Luanne Bras, MD;  Location: [facility];  Service: Radiology;  Laterality: N/A;  . RADIOLOGY WITH ANESTHESIA N/A 07/27/2014   Procedure: ANGIOPLASTY;  Surgeon: Luanne Bras, MD;  Location: [facility];  Service: Radiology;  Laterality: N/A;  . TONSILLECTOMY      Family History  Problem Relation Age of Onset  . Stroke Mother   . CAD Father   . Diabetes type II Brother   . CAD Brother     Social history:  reports that she quit smoking about 38 years ago. Her smoking use included Cigarettes. She has a 37.50 pack-year smoking history. She has never used  smokeless tobacco. She reports that she does not drink alcohol or use drugs.    Allergies  Allergen Reactions  . Ezetimibe Other (See Comments)    Myalgia  . Welchol [Colesevelam Hcl] Other (See Comments)    Muscle aches  . Adhesive [Tape] Itching, Rash and Other (See Comments)    Burning   . Ceclor [Cefaclor] Other (See Comments)    Unknown allergic reaction  . Elastic Bandages & [Zinc] Rash and Other (See Comments)    Turns red on the areas it touches  . Latex Itching, Rash and Other (See Comments)    Burning   . Lipitor [Atorvastatin Calcium] Other (See Comments)    Increased fibromyalgia pain  . Mevacor [Lovastatin] Other (See Comments)    Increased fibromyalgia pain  . Pravachol Other (See  Comments)    Increased fibromyalgia pain  . Vasotec Other (See Comments)    Unknown allergic reaction    Medications:  Prior to Admission medications   Medication Sig Start Date End Date Taking? Authorizing Provider  alendronate (FOSAMAX) 70 MG tablet Take 70 mg by mouth every Sunday. Take with a full glass of water on an empty stomach.   Yes Historical Provider, MD  allopurinol (ZYLOPRIM) 300 MG tablet Take 300 mg by mouth every other day.   Yes Historical Provider, MD  amitriptyline (ELAVIL) 10 MG tablet Take 2 tablets (20 mg total) by mouth at bedtime. 09/07/15  Yes Kathrynn Ducking, MD  amLODipine (NORVASC) 5 MG tablet Take 1 tablet (5 mg total) by mouth at bedtime. 07/03/15  Yes Belva Crome, MD  Calcium Carbonate-Vitamin D (CALCIUM + D PO) Take 1 tablet by mouth daily.   Yes Historical Provider, MD  celecoxib (CELEBREX) 200 MG capsule Take 200 mg by mouth 2 (two) times daily.    Yes Historical Provider, MD  citalopram (CELEXA) 20 MG tablet TAKE 1 TABLET BY MOUTH TWICE A DAY 11/09/14  Yes Darlin Coco, MD  clopidogrel (PLAVIX) 75 MG tablet Take 1 tablet (75 mg total) by mouth daily. 08/27/15  Yes Belva Crome, MD  Coenzyme Q10 (CO Q-10 PO) Take 1 capsule by mouth daily.   Yes Historical Provider, MD  colchicine (COLCRYS) 0.6 MG tablet Take by mouth. 05/21/10  Yes Historical Provider, MD  COUMADIN 5 MG tablet Take as directed by coumadin clinic 07/04/15  Yes Belva Crome, MD  digoxin Fonnie [place]) 0.125 MG tablet Take 1 tablet (0.125 mg total) by mouth on Mondays, Wednesdays, & Fridays 06/08/15  Yes Belva Crome, MD  donepezil (ARICEPT) 5 MG tablet TAKE 1 TABLET (5 MG TOTAL) BY MOUTH DAILY. 06/06/15  Yes Kathrynn Ducking, MD  ergocalciferol (VITAMIN D2) 50000 UNITS capsule Take 50,000 Units by mouth 2 (two) times a week. Sunday and Wednesday   Yes Historical Provider, MD  furosemide (LASIX) 80 MG tablet Take 1 tablet (80 mg total) by mouth 2 (two) times daily. Patient taking differently: Take  80 mg by mouth daily.  06/28/15  Yes Belva Crome, MD  levothyroxine (SYNTHROID, LEVOTHROID) 25 MCG tablet Take 25 mcg by mouth daily.   Yes Historical Provider, MD  memantine (NAMENDA XR) 28 MG CP24 24 hr capsule Take 1 capsule (28 mg total) by mouth daily. 09/07/15  Yes Kathrynn Ducking, MD  metoprolol succinate (TOPROL-XL) 25 MG 24 hr tablet TAKE 1 TABLET (25 MG TOTAL) BY MOUTH DAILY. 08/29/15  Yes Belva Crome, MD  Multiple Vitamins-Minerals (MULTIVITAMIN WITH MINERALS) tablet Take 1 tablet by  mouth daily.   Yes Historical Provider, MD  nitroGLYCERIN (NITROSTAT) 0.4 MG SL tablet Place 1 tablet (0.4 mg total) under the tongue every 5 (five) minutes as needed for chest pain. 09/13/12  Yes Darlin Coco, MD  Polyethyl Glycol-Propyl Glycol (SYSTANE OP) Place 1 drop into both eyes daily.    Yes Historical Provider, MD  potassium chloride SA (KLOR-CON M20) 20 MEQ tablet Take 1 tablet (20 mEq total) by mouth 3 (three) times daily. 06/19/15  Yes Belva Crome, MD  PROAIR HFA 108 [phone] BASE) MCG/ACT inhaler INHALE 2 PUFFS INTO LUNGS EVERY 6 HOURS AS NEEDED FOR WHEEZING 12/22/13  Yes Darlin Coco, MD  valsartan (DIOVAN) 40 MG tablet TAKE 1 TABLET BY MOUTH TWICE A DAY 02/06/15  Yes Darlin Coco, MD    ROS:  Out of a complete 14 system review of symptoms, the patient complains only of the following symptoms, and all other reviewed systems are negative.  Hearing loss, runny nose Shortness of breath Chest pain Walking difficulty Bruising easily Memory loss, dizziness, numbness  Blood pressure (!) 142/84, pulse 74, height 5\' 5"  (1.651 m), weight 169 lb 8 oz (76.9 kg).  Physical Exam  General: The patient is alert and cooperative at the time of the examination.  Skin: No significant peripheral edema is noted. The patient wears a brace for the left ankle and foot.   Neurologic Exam  Mental status: The patient is alert and oriented x 3 at the time of the examination. The patient has apparent  normal recent and remote memory, with an apparently normal attention span and concentration ability. Mini-Mental Status Examination done today shows a total score of 30/30.   Cranial nerves: Facial symmetry is present. Speech is normal, no aphasia or dysarthria is noted. Extraocular movements are full. Visual fields are full.  Motor: The patient has good strength in all 4 extremities.  Sensory examination: Soft touch sensation is symmetric on the face, arms, and legs.  Coordination: The patient has good finger-nose-finger and heel-to-shin bilaterally.  Gait and station: The patient has a slightly wide-based, unsteady gait. The patient walks well with a walker, she takes good stride with good turns. Tandem gait was not attempted. Romberg is unsteady, the patient has a tendency to fall.  Reflexes: Deep tendon reflexes are symmetric.   Assessment/Plan:  1. Cerebrovascular disease, left hemiparesis  2. Gait disorder  3. Mild memory disorder  The patient is doing well on Aricept and Namenda, these medications will be continued. A prescription for Namenda was called in. The patient has issues with insomnia on 10 mg of amitriptyline, this will be increased to 20 mg at night. The patient will follow-up in one year, sooner if needed.  Jill Alexanders MD 09/07/2015 10:16 AM  [facility] [address] [place], [place] [address]  Phone [phone] Fax [phone]

## 2015-09-07 NOTE — Patient Instructions (Addendum)
Fall Prevention in the Home  Falls can cause injuries and can affect people from all age groups. There are many simple things that you can do to make your home safe and to help prevent falls. WHAT CAN I DO ON THE OUTSIDE OF MY HOME?  Regularly repair the edges of walkways and driveways and fix any cracks.  Remove high doorway thresholds.  Trim any shrubbery on the main path into your home.  Use bright outdoor lighting.  Clear walkways of debris and clutter, including tools and rocks.  Regularly check that handrails are securely fastened and in good repair. Both sides of any steps should have handrails.  Install guardrails along the edges of any raised decks or porches.  Have leaves, snow, and ice cleared regularly.  Use sand or salt on walkways during winter months.  In the garage, clean up any spills right away, including grease or oil spills. WHAT CAN I DO IN THE BATHROOM?  Use night lights.  Install grab bars by the toilet and in the tub and shower. Do not use towel bars as grab bars.  Use non-skid mats or decals on the floor of the tub or shower.  If you need to sit down while you are in the shower, use a plastic, non-slip stool..  Keep the floor dry. Immediately clean up any water that spills on the floor.  Remove soap buildup in the tub or shower on a regular basis.  Attach bath mats securely with double-sided non-slip rug tape.  Remove throw rugs and other tripping hazards from the floor. WHAT CAN I DO IN THE BEDROOM?  Use night lights.  Make sure that a bedside light is easy to reach.  Do not use oversized bedding that drapes onto the floor.  Have a firm chair that has side arms to use for getting dressed.  Remove throw rugs and other tripping hazards from the floor. WHAT CAN I DO IN THE KITCHEN?   Clean up any spills right away.  Avoid walking on wet floors.  Place frequently used items in easy-to-reach places.  If you need to reach for something  above you, use a sturdy step stool that has a grab bar.  Keep electrical cables out of the way.  Do not use floor polish or wax that makes floors slippery. If you have to use wax, make sure that it is non-skid floor wax.  Remove throw rugs and other tripping hazards from the floor. WHAT CAN I DO IN THE STAIRWAYS?  Do not leave any items on the stairs.  Make sure that there are handrails on both sides of the stairs. Fix handrails that are broken or loose. Make sure that handrails are as long as the stairways.  Check any carpeting to make sure that it is firmly attached to the stairs. Fix any carpet that is loose or worn.  Avoid having throw rugs at the top or bottom of stairways, or secure the rugs with carpet tape to prevent them from moving.  Make sure that you have a light switch at the top of the stairs and the bottom of the stairs. If you do not have them, have them installed. WHAT ARE SOME OTHER FALL PREVENTION TIPS?  Wear closed-toe shoes that fit well and support your feet. Wear shoes that have rubber soles or low heels.  When you use a stepladder, make sure that it is completely opened and that the sides are firmly locked. Have someone hold the ladder while you   are using it. Do not climb a closed stepladder.  Add color or contrast paint or tape to grab bars and handrails in your home. Place contrasting color strips on the first and last steps.  Use mobility aids as needed, such as canes, walkers, scooters, and crutches.  Turn on lights if it is dark. Replace any light bulbs that burn out.  Set up furniture so that there are clear paths. Keep the furniture in the same spot.  Fix any uneven floor surfaces.  Choose a carpet design that does not hide the edge of steps of a stairway.  Be aware of any and all pets.  Review your medicines with your healthcare provider. Some medicines can cause dizziness or changes in blood pressure, which increase your risk of falling. Talk  with your health care provider about other ways that you can decrease your risk of falls. This may include working with a physical therapist or trainer to improve your strength, balance, and endurance.   This information is not intended to replace advice given to you by your health care provider. Make sure you discuss any questions you have with your health care provider.   Document Released: 01/17/2002 Document Revised: 06/13/2014 Document Reviewed: 03/03/2014 Elsevier Interactive Patient Education 2016 [employer]  

## 2015-09-12 ENCOUNTER — Other Ambulatory Visit: Payer: Self-pay | Admitting: Family Medicine

## 2015-09-12 ENCOUNTER — Ambulatory Visit
Admission: RE | Admit: 2015-09-12 | Discharge: 2015-09-12 | Disposition: A | Discharge status: Home / Self Care | Payer: Medicare Other | Source: Ambulatory Visit | Attending: Family Medicine | Admitting: Family Medicine

## 2015-09-12 DIAGNOSIS — R0789 Other chest pain: Secondary | ICD-10-CM

## 2015-09-29 ENCOUNTER — Other Ambulatory Visit: Payer: Self-pay | Admitting: Interventional Cardiology

## 2015-10-01 ENCOUNTER — Other Ambulatory Visit: Payer: Self-pay | Admitting: Neurology

## 2015-10-03 ENCOUNTER — Other Ambulatory Visit: Payer: Self-pay | Admitting: Neurology

## 2015-10-08 ENCOUNTER — Other Ambulatory Visit: Payer: Self-pay | Admitting: *Deleted

## 2015-10-08 MED ORDER — AMLODIPINE BESYLATE 5 MG PO TABS
5.0000 mg | ORAL_TABLET | Freq: Every day | ORAL | 1 refills | Status: DC
Start: 2015-10-08 — End: 2016-04-24

## 2015-10-09 ENCOUNTER — Encounter (INDEPENDENT_AMBULATORY_CARE_PROVIDER_SITE_OTHER): Payer: Self-pay

## 2015-10-09 ENCOUNTER — Encounter: Payer: Self-pay | Admitting: Interventional Cardiology

## 2015-10-09 ENCOUNTER — Ambulatory Visit (INDEPENDENT_AMBULATORY_CARE_PROVIDER_SITE_OTHER): Payer: Medicare Other

## 2015-10-09 ENCOUNTER — Ambulatory Visit (INDEPENDENT_AMBULATORY_CARE_PROVIDER_SITE_OTHER): Payer: Medicare Other | Admitting: Interventional Cardiology

## 2015-10-09 VITALS — BP 170/88 | HR 74 | Ht 65.0 in | Wt 165.0 lb

## 2015-10-09 DIAGNOSIS — I482 Chronic atrial fibrillation: Secondary | ICD-10-CM | POA: Diagnosis not present

## 2015-10-09 DIAGNOSIS — I2510 Atherosclerotic heart disease of native coronary artery without angina pectoris: Secondary | ICD-10-CM | POA: Diagnosis not present

## 2015-10-09 DIAGNOSIS — I6350 Cerebral infarction due to unspecified occlusion or stenosis of unspecified cerebral artery: Secondary | ICD-10-CM

## 2015-10-09 DIAGNOSIS — I4821 Permanent atrial fibrillation: Secondary | ICD-10-CM

## 2015-10-09 DIAGNOSIS — I4891 Unspecified atrial fibrillation: Secondary | ICD-10-CM | POA: Diagnosis not present

## 2015-10-09 DIAGNOSIS — E785 Hyperlipidemia, unspecified: Secondary | ICD-10-CM

## 2015-10-09 DIAGNOSIS — Z7901 Long term (current) use of anticoagulants: Secondary | ICD-10-CM

## 2015-10-09 DIAGNOSIS — I5032 Chronic diastolic (congestive) heart failure: Secondary | ICD-10-CM | POA: Diagnosis not present

## 2015-10-09 DIAGNOSIS — I119 Hypertensive heart disease without heart failure: Secondary | ICD-10-CM

## 2015-10-09 LAB — POCT INR: INR: 3.3

## 2015-10-09 MED ORDER — FUROSEMIDE 40 MG PO TABS: 40.0000 mg | ORAL_TABLET | Freq: Every day | ORAL | 1 refills | Status: DC

## 2015-10-09 MED ORDER — FUROSEMIDE 80 MG PO TABS: 80.0000 mg | ORAL_TABLET | Freq: Every day | ORAL | 1 refills | Status: DC

## 2015-10-09 NOTE — Patient Instructions (Signed)
Medication Instructions:  Take lasix (furosemide) 80mg  in the AM  Take lasix (furosemide) 40mg  in the PM  Labwork: Your physician recommends that you return for lab work in: 1 month --BMET/Digoxin level-you can have this done when you see the PA or NP   Testing/Procedures: none  Follow-Up: Your physician recommends that you schedule a follow-up appointment in about a month with a PA or NP  Your physician wants you to follow-up in: 6 months with Dr Tamala Julian. (February 2018)  You will receive a reminder letter in the mail two months in advance. If you don't receive a letter, please call our office to schedule the follow-up appointment.   Any Other Special Instructions Will Be Listed Below (If Applicable).     If you need a refill on your cardiac medications before your next appointment, please call your pharmacy.

## 2015-10-09 NOTE — Progress Notes (Signed)
Cardiology Office Note    Date:  10/09/2015   ID:  Amber [name], Amber [name] 10-06-1934, MRN [id number]  PCP:  Gara Kroner, MD  Cardiologist: Sinclair Grooms, MD   Chief Complaint  Patient presents with  . Follow-up    Hypertension  . Coronary Artery Disease    History of Present Illness:  Amber [name] is a 81 y.o. female for follow-up of coronary artery disease, chronic atrial fibrillation, chronic diastolic heart failure, chronic kidney disease, stage III, hyperlipidemia, and essential hypertension. Increasingly difficult for her and now is decrease in memory.  Has difficulty remembering exactly what doses of medications she is taking. The concern is that her blood pressure is elevated today. She is on furosemide 80 mg once per day. I records indicate that she should be on 80 twice per day. She seems be doing well on her current regimen except for the blood pressure elevation.  She has not had syncope, chest pain, orthopnea. There is no peripheral edema.    Past Medical History:  Diagnosis Date  . Abnormality of gait 08/02/2014  . Atrial fibrillation ([place])    a. chronic/rate controlled;  b. chronic coumadin.  . Cervical spinal stenosis   . Chronic diastolic CHF (congestive heart failure) ([place])    a. 02/2013 Echo: EF 55-60%, mild AI, mod dil LA.  [name] COPD with emphysema ([place])    PFT 05/02/10>>FEV1 1.35(62%), FEV1% 66, DLCO 75%  . Coronary artery disease    a. s/p CABG;  b. 04/2010 Neg MV  . CVA (cerebral vascular accident) ([place])    left sided weakness  . Dementia   . Depression   . Fibromyalgia   . GERD (gastroesophageal reflux disease)    pepcid 2-3 times per week  . Gout   . Hemiparesis and alteration of sensations as late effects of stroke ([place]) 01/17/2015  . History of melanoma    squamous cell, melanoma  . Hyperlipidemia   . Hypertension   . Hypothyroidism   . Insomnia   . Intermittent confusion   . Memory change 01/23/2014  . Nasal polyposis   .  Osteoarthritis   . Osteoporosis   . Pneumonia    1990    Past Surgical History:  Procedure Laterality Date  . ABDOMINAL HYSTERECTOMY    . BREAST LUMPECTOMY  1980s   Benign lesion - right  . carpel tunnel     right  . CORONARY ARTERY BYPASS GRAFT  2000  . EYE SURGERY     bilateral cataracts  . RADIOLOGY WITH ANESTHESIA  12/24/2011   Procedure: RADIOLOGY WITH ANESTHESIA;  Surgeon: Medication Radiologist, MD;  Location: [facility];  Service: Radiology;  Laterality: N/A;  Extra Cranial Vascular Stent  . RADIOLOGY WITH ANESTHESIA N/A 07/19/2014   Procedure: ANGIOPLASTY;  Surgeon: Luanne Bras, MD;  Location: [facility];  Service: Radiology;  Laterality: N/A;  . RADIOLOGY WITH ANESTHESIA N/A 07/27/2014   Procedure: ANGIOPLASTY;  Surgeon: Luanne Bras, MD;  Location: [facility];  Service: Radiology;  Laterality: N/A;  . TONSILLECTOMY      Current Medications: Outpatient Medications Prior to Visit  Medication Sig Dispense Refill  . alendronate (FOSAMAX) 70 MG tablet Take 70 mg by mouth every Sunday. Take with a full glass of water on an empty stomach.    [name] allopurinol (ZYLOPRIM) 300 MG tablet Take 300 mg by mouth every other day.    [name] amitriptyline (ELAVIL) 10 MG tablet Take 2 tablets (20 mg total) by mouth at bedtime.  180 tablet 3  . amLODipine (NORVASC) 5 MG tablet Take 1 tablet (5 mg total) by mouth at bedtime. 90 tablet 1  . Calcium Carbonate-Vitamin D (CALCIUM + D PO) Take 1 tablet by mouth daily.    . celecoxib (CELEBREX) 200 MG capsule Take 200 mg by mouth 2 (two) times daily.     . citalopram (CELEXA) 20 MG tablet TAKE 1 TABLET BY MOUTH TWICE A DAY 60 tablet 11  . clopidogrel (PLAVIX) 75 MG tablet Take 1 tablet (75 mg total) by mouth daily. 90 tablet 1  . Coenzyme Q10 (CO Q-10 PO) Take 1 capsule by mouth daily.    . colchicine (COLCRYS) 0.6 MG tablet Take by mouth.    . COUMADIN 5 MG tablet TAKE AS DIRECTED BY COUMADIN CLINIC 135 tablet 1  . digoxin (LANOXIN) 0.125 MG tablet Take 1  tablet (0.125 mg total) by mouth on Mondays, Wednesdays, & Fridays 45 tablet 3  . donepezil (ARICEPT) 5 MG tablet TAKE 1 TABLET (5 MG TOTAL) BY MOUTH DAILY. 90 tablet 1  . ergocalciferol (VITAMIN D2) 50000 UNITS capsule Take 50,000 Units by mouth 2 (two) times a week. Sunday and Wednesday    . levothyroxine (SYNTHROID, LEVOTHROID) 25 MCG tablet Take 25 mcg by mouth daily.    . memantine (NAMENDA XR) 28 MG CP24 24 hr capsule Take 1 capsule (28 mg total) by mouth daily. 90 capsule 3  . metoprolol succinate (TOPROL-XL) 25 MG 24 hr tablet TAKE 1 TABLET (25 MG TOTAL) BY MOUTH DAILY. 90 tablet 2  . Multiple Vitamins-Minerals (MULTIVITAMIN WITH MINERALS) tablet Take 1 tablet by mouth daily.    . nitroGLYCERIN (NITROSTAT) 0.4 MG SL tablet Place 1 tablet (0.4 mg total) under the tongue every 5 (five) minutes as needed for chest pain. 25 tablet 3  . Polyethyl Glycol-Propyl Glycol (SYSTANE OP) Place 1 drop into both eyes daily.     . potassium chloride SA (KLOR-CON M20) 20 MEQ tablet Take 1 tablet (20 mEq total) by mouth 3 (three) times daily. 270 tablet 3  . PROAIR HFA 108 (90 BASE) MCG/ACT inhaler INHALE 2 PUFFS INTO LUNGS EVERY 6 HOURS AS NEEDED FOR WHEEZING 8.5 each 0  . valsartan (DIOVAN) 40 MG tablet TAKE 1 TABLET BY MOUTH TWICE A DAY 180 tablet 3  . furosemide (LASIX) 80 MG tablet Take 1 tablet (80 mg total) by mouth 2 (two) times daily. (Patient taking differently: Take 80 mg by mouth daily. ) 180 tablet 3   No facility-administered medications prior to visit.      Allergies:   Ezetimibe; Welchol [colesevelam hcl]; Adhesive [tape]; Ceclor [cefaclor]; Elastic bandages & [zinc]; Latex; Lipitor [atorvastatin calcium]; Mevacor [lovastatin]; Pravachol; and Vasotec   Social History   Social History  . Marital status: Married    Spouse name: N/A  . Number of children: 2  . Years of education: N/A   Occupational History  . [employer]  . Secretary     Retired    Social History Main Topics  . Smoking status: Former Smoker    Packs/day: 1.50    Years: 25.00    Types: Cigarettes    Quit date: 02/10/1977  . Smokeless tobacco: Never Used  . Alcohol use No  . Drug use: No  . Sexual activity: Not Asked   Other Topics Concern  . None   Social History Narrative   Patient is right handed.   Patient lives at home with husband   Patient drinks  8 oz caffeine occasionally.     Family History:  The patient's family history includes CAD in her brother and father; Diabetes type II in her brother; Stroke in her mother.   ROS:   Please see the history of present illness.    Right-sided weakness related to prior stroke. No blood in her urine or stool.  All other systems reviewed and are negative.   PHYSICAL EXAM:   VS:  BP (!) 170/88   Pulse 74   Ht 5\' 5"  (1.651 m)   Wt 165 lb (74.8 kg)   BMI 27.46 kg/m    GEN: Well nourished, well developed, in no acute distress  HEENT: normal  Neck: no JVD, carotid bruits, or masses Cardiac: IIRR; no murmurs, rubs, or gallops,no edema  Respiratory:  clear to auscultation bilaterally, normal work of breathing GI: soft, nontender, nondistended, + BS MS: no deformity or atrophy  Skin: warm and dry, no rash Neuro:  Alert and Oriented x 3, Strength and sensation are intact Psych: euthymic mood, full affect  Wt Readings from Last 3 Encounters:  10/09/15 165 lb (74.8 kg)  09/07/15 169 lb 8 oz (76.9 kg)  06/08/15 158 lb 12.8 oz (72 kg)      Studies/Labs Reviewed:   EKG:  EKG  Atrial fibrillation with controlled ventricular response at 74 bpm. Nonspecific ST-T abnormality related to dig effect.  Recent Labs: 12/11/2014: ALT 16; BUN 15; Creat 0.86; Hemoglobin 14.5; Platelets 249; Potassium 3.9; Sodium 138; TSH 0.866   Lipid Panel    Component Value Date/Time   CHOL 392 (H) 12/11/2014 1644   TRIG 373 (H) 12/11/2014 1644   HDL 44 (L) 12/11/2014 1644   CHOLHDL 8.9 (H) 12/11/2014 1644   VLDL 75 (H)  12/11/2014 1644   LDLCALC 273 (H) 12/11/2014 1644   LDLDIRECT 236.0 08/02/2014 1418    Additional studies/ records that were reviewed today include:  I reviewed the recent office visit by Dr. Floyde Parkins. This office visit occurred 09/07/2015. Final diagnoses were cerebrovascular disease with left hemi-per his, gait disturbance, and mild memory disorder. She is currently on Aricept and Namenda. Amitriptyline was increased to 20 mg per day for sleep.    ASSESSMENT:    1. Permanent atrial fibrillation ([place])   2. Chronic diastolic heart failure ([place])   3. Coronary artery disease involving native coronary artery of native heart without angina pectoris   4. Benign hypertensive heart disease without heart failure   5. Dyslipidemia   6. Long-term (current) use of anticoagulants      PLAN:  In order of problems listed above:  1. Excellent rate control. An one month a digoxin level will be obtained. 2. No obvious evidence of diastolic heart failure exacerbation. No clinical evidence of volume overload. 3. Denies symptoms that suggest myocardial ischemia. 4. Her blood pressures elevated today. I believe it is because her diuretic regimen has not been optimized to the level that it was previously. She is on furosemide 80 mg daily. We will increase the dose back to 80 a.m. and 40 p.m. New prescription for 40 mg furosemide tablets is given. I asked her not to change her medication regimen on her home without at least I'll control [place] about it. We will plan to have blood pressure follow-up, and basic metabolic panel done by an APP in 4 weeks. 5. Lipids were not addressed. 6. The combination of Coumadin and Plavix is chronic and I believe Plavix was added to Coumadin after she had a  stroke on Coumadin therapy. I will have to research her chart to know for sure. No changes for the time being.    Medication Adjustments/Labs and Tests Ordered: Current medicines are reviewed at length with the patient  today.  Concerns regarding medicines are outlined above.  Medication changes, Labs and Tests ordered today are listed in the Patient Instructions below. Patient Instructions  Medication Instructions:  Take lasix (furosemide) 80mg  in the AM  Take lasix (furosemide) 40mg  in the PM  Labwork: Your physician recommends that you return for lab work in: 1 month --BMET/Digoxin level-you can have this done when you see the PA or NP   Testing/Procedures: none  Follow-Up: Your physician recommends that you schedule a follow-up appointment in about a month with a PA or NP  Your physician wants you to follow-up in: 6 months with Dr Tamala Julian. (February 2018)  You will receive a reminder letter in the mail two months in advance. If you don't receive a letter, please call our office to schedule the follow-up appointment.   Any Other Special Instructions Will Be Listed Below (If Applicable).     If you need a refill on your cardiac medications before your next appointment, please call your pharmacy.      Signed, Sinclair Grooms, MD  10/09/2015 3:51 PM    [facility] [place], [place], [place]  [address] Phone: [phone]; Fax: [phone]

## 2015-10-26 ENCOUNTER — Encounter: Payer: Self-pay | Admitting: Nurse Practitioner

## 2015-11-01 ENCOUNTER — Telehealth (HOSPITAL_COMMUNITY): Payer: Self-pay

## 2015-11-01 NOTE — Telephone Encounter (Signed)
Left message for pt to return call to schedule 6 month f/u ultrasound. AW

## 2015-11-09 ENCOUNTER — Ambulatory Visit (INDEPENDENT_AMBULATORY_CARE_PROVIDER_SITE_OTHER): Payer: Medicare Other | Admitting: Nurse Practitioner

## 2015-11-09 ENCOUNTER — Encounter: Payer: Self-pay | Admitting: Nurse Practitioner

## 2015-11-09 ENCOUNTER — Encounter (INDEPENDENT_AMBULATORY_CARE_PROVIDER_SITE_OTHER): Payer: Self-pay

## 2015-11-09 ENCOUNTER — Ambulatory Visit (INDEPENDENT_AMBULATORY_CARE_PROVIDER_SITE_OTHER): Payer: Medicare Other | Admitting: Pharmacist

## 2015-11-09 ENCOUNTER — Other Ambulatory Visit: Payer: Medicare Other | Admitting: *Deleted

## 2015-11-09 VITALS — BP 136/86 | HR 55 | Ht 66.0 in | Wt 164.4 lb

## 2015-11-09 DIAGNOSIS — I119 Hypertensive heart disease without heart failure: Secondary | ICD-10-CM

## 2015-11-09 DIAGNOSIS — I4821 Permanent atrial fibrillation: Secondary | ICD-10-CM

## 2015-11-09 DIAGNOSIS — I6350 Cerebral infarction due to unspecified occlusion or stenosis of unspecified cerebral artery: Secondary | ICD-10-CM | POA: Diagnosis not present

## 2015-11-09 DIAGNOSIS — I2510 Atherosclerotic heart disease of native coronary artery without angina pectoris: Secondary | ICD-10-CM

## 2015-11-09 DIAGNOSIS — I5032 Chronic diastolic (congestive) heart failure: Secondary | ICD-10-CM

## 2015-11-09 DIAGNOSIS — I4891 Unspecified atrial fibrillation: Secondary | ICD-10-CM

## 2015-11-09 DIAGNOSIS — E785 Hyperlipidemia, unspecified: Secondary | ICD-10-CM

## 2015-11-09 DIAGNOSIS — Z7901 Long term (current) use of anticoagulants: Secondary | ICD-10-CM

## 2015-11-09 DIAGNOSIS — I482 Chronic atrial fibrillation: Secondary | ICD-10-CM | POA: Diagnosis not present

## 2015-11-09 LAB — BASIC METABOLIC PANEL
BUN: 20 mg/dL (ref 7–25)
CALCIUM: 8.9 mg/dL (ref 8.6–10.4)
CO2: 26 mmol/L (ref 20–31)
Chloride: 101 mmol/L (ref 98–110)
Creat: 0.97 mg/dL — ABNORMAL HIGH (ref 0.60–0.93)
GLUCOSE: 91 mg/dL (ref 65–99)
Potassium: 4.6 mmol/L (ref 3.5–5.3)
Sodium: 138 mmol/L (ref 135–146)

## 2015-11-09 LAB — POCT INR: INR: 3.2

## 2015-11-09 NOTE — Patient Instructions (Addendum)
  Medication Instructions:    Continue with your current medicines.     Testing/Procedures To Be Arranged:  N/A  Follow-Up:   See me in about 3 months.     Other Special Instructions:   N/A    If you need a refill on your cardiac medications before your next appointment, please call your pharmacy.   Call the [place] office at [phone] if you have any questions, problems or concerns.

## 2015-11-09 NOTE — Progress Notes (Signed)
CARDIOLOGY OFFICE NOTE  Date:  11/09/2015    Amber [name] Date of Birth: 1935-01-23 Medical Record [id number]  PCP:  Gara Kroner, MD  Cardiologist:  Tamala Julian  Chief Complaint  Patient presents with  . Hypertension  . Atrial Fibrillation    Seen for Dr. Tamala Julian    History of Present Illness: Amber [name] is a 81 y.o. female who presents today for a one month check. Seen for Dr. Tamala Julian.   She has a history of coronary artery disease with prior CABG in 2000, chronic atrial fibrillation, chronic diastolic heart failure, chronic kidney disease, stage III, hyperlipidemia (statin intolerant), and essential hypertension. She has had progressive memory loss. She has had remote stroke with residual left hemiparesis. Other issues include fibromyalgia. She apparently has had chronically elevated CK levels. Has an apparent vertebral stenosis.   Seen about a month ago - had trouble remembering her medicines. BP was up. Dr. Tamala Julian adjusted her medicines.   Comes in today. Here alone today. Husband waited in the car. She feels like is doing ok. BP improved. Does not remember Dr. Tamala Julian changing medicines at last visit. Eats out at least once a day. Does not really think about how much salt she gets. Has fallen again in the last month - no serious injury. Remains on Plavix and Coumadin. Looks as if she has been on this at least over the past 5 years. She denies any bleeding. No swelling. She says she is "just living" and is ok with how she is at this time. Tries to be careful and uses her walker consistently.   Past Medical History:  Diagnosis Date  . Abnormality of gait 08/02/2014  . Atrial fibrillation ([place])    a. chronic/rate controlled;  b. chronic coumadin.  . Cervical spinal stenosis   . Chronic diastolic CHF (congestive heart failure) ([place])    a. 02/2013 Echo: EF 55-60%, mild AI, mod dil LA.  [name] COPD with emphysema ([place])    PFT 05/02/10>>FEV1 1.35(62%), FEV1% 66, DLCO 75%  . Coronary  artery disease    a. s/p CABG;  b. 04/2010 Neg MV  . CVA (cerebral vascular accident) ([place])    left sided weakness  . Dementia   . Depression   . Fibromyalgia   . GERD (gastroesophageal reflux disease)    pepcid 2-3 times per week  . Gout   . Hemiparesis and alteration of sensations as late effects of stroke ([place]) 01/17/2015  . History of melanoma    squamous cell, melanoma  . Hyperlipidemia   . Hypertension   . Hypothyroidism   . Insomnia   . Intermittent confusion   . Memory change 01/23/2014  . Nasal polyposis   . Osteoarthritis   . Osteoporosis   . Pneumonia    1990    Past Surgical History:  Procedure Laterality Date  . ABDOMINAL HYSTERECTOMY    . BREAST LUMPECTOMY  1980s   Benign lesion - right  . carpel tunnel     right  . CORONARY ARTERY BYPASS GRAFT  2000  . EYE SURGERY     bilateral cataracts  . RADIOLOGY WITH ANESTHESIA  12/24/2011   Procedure: RADIOLOGY WITH ANESTHESIA;  Surgeon: Medication Radiologist, MD;  Location: [facility];  Service: Radiology;  Laterality: N/A;  Extra Cranial Vascular Stent  . RADIOLOGY WITH ANESTHESIA N/A 07/19/2014   Procedure: ANGIOPLASTY;  Surgeon: Luanne Bras, MD;  Location: [facility];  Service: Radiology;  Laterality: N/A;  . RADIOLOGY WITH ANESTHESIA  N/A 07/27/2014   Procedure: ANGIOPLASTY;  Surgeon: Luanne Bras, MD;  Location: [facility];  Service: Radiology;  Laterality: N/A;  . TONSILLECTOMY       Medications: Current Outpatient Prescriptions  Medication Sig Dispense Refill  . alendronate (FOSAMAX) 70 MG tablet Take 70 mg by mouth every Sunday. Take with a full glass of water on an empty stomach.    [name] allopurinol (ZYLOPRIM) 300 MG tablet Take 300 mg by mouth every other day.    [name] amitriptyline (ELAVIL) 10 MG tablet Take 2 tablets (20 mg total) by mouth at bedtime. 180 tablet 3  . amLODipine (NORVASC) 5 MG tablet Take 1 tablet (5 mg total) by mouth at bedtime. 90 tablet 1  . Calcium Carbonate-Vitamin D (CALCIUM + D PO) Take 1  tablet by mouth daily.    . celecoxib (CELEBREX) 200 MG capsule Take 200 mg by mouth 2 (two) times daily.     . citalopram (CELEXA) 20 MG tablet TAKE 1 TABLET BY MOUTH TWICE A DAY 60 tablet 11  . clopidogrel (PLAVIX) 75 MG tablet Take 1 tablet (75 mg total) by mouth daily. 90 tablet 1  . Coenzyme Q10 (CO Q-10 PO) Take 1 capsule by mouth daily.    . colchicine (COLCRYS) 0.6 MG tablet Take 0.6 mg by mouth as needed (gout flare up).     . COUMADIN 5 MG tablet TAKE AS DIRECTED BY COUMADIN CLINIC 135 tablet 1  . digoxin (LANOXIN) 0.125 MG tablet Take 1 tablet (0.125 mg total) by mouth on Mondays, Wednesdays, & Fridays 45 tablet 3  . donepezil (ARICEPT) 5 MG tablet TAKE 1 TABLET (5 MG TOTAL) BY MOUTH DAILY. 90 tablet 1  . ergocalciferol (VITAMIN D2) 50000 UNITS capsule Take 50,000 Units by mouth 2 (two) times a week. Sunday and Wednesday    . furosemide (LASIX) 40 MG tablet Take 1 tablet (40 mg total) by mouth daily. In the PM 30 tablet 1  . furosemide (LASIX) 80 MG tablet Take 1 tablet (80 mg total) by mouth daily. In the AM 30 tablet 1  . levothyroxine (SYNTHROID, LEVOTHROID) 25 MCG tablet Take 25 mcg by mouth daily.    . memantine (NAMENDA XR) 28 MG CP24 24 hr capsule Take 1 capsule (28 mg total) by mouth daily. 90 capsule 3  . metoprolol succinate (TOPROL-XL) 25 MG 24 hr tablet TAKE 1 TABLET (25 MG TOTAL) BY MOUTH DAILY. 90 tablet 2  . Multiple Vitamins-Minerals (MULTIVITAMIN WITH MINERALS) tablet Take 1 tablet by mouth daily.    . nitroGLYCERIN (NITROSTAT) 0.4 MG SL tablet Place 1 tablet (0.4 mg total) under the tongue every 5 (five) minutes as needed for chest pain. 25 tablet 3  . Polyethyl Glycol-Propyl Glycol (SYSTANE OP) Place 1 drop into both eyes daily.     . potassium chloride SA (KLOR-CON M20) 20 MEQ tablet Take 1 tablet (20 mEq total) by mouth 3 (three) times daily. 270 tablet 3  . PROAIR HFA 108 (90 BASE) MCG/ACT inhaler INHALE 2 PUFFS INTO LUNGS EVERY 6 HOURS AS NEEDED FOR WHEEZING 8.5  each 0  . valsartan (DIOVAN) 40 MG tablet TAKE 1 TABLET BY MOUTH TWICE A DAY 180 tablet 3   No current facility-administered medications for this visit.     Allergies: Allergies  Allergen Reactions  . Ezetimibe Other (See Comments)    Myalgia  . Welchol [Colesevelam Hcl] Other (See Comments)    Muscle aches  . Adhesive [Tape] Itching, Rash and Other (See Comments)  Burning   . Ceclor [Cefaclor] Other (See Comments)    Unknown allergic reaction  . Elastic Bandages & [Zinc] Rash and Other (See Comments)    Turns red on the areas it touches  . Latex Itching, Rash and Other (See Comments)    Burning   . Lipitor [Atorvastatin Calcium] Other (See Comments)    Increased fibromyalgia pain  . Mevacor [Lovastatin] Other (See Comments)    Increased fibromyalgia pain  . Pravachol Other (See Comments)    Increased fibromyalgia pain  . Vasotec Other (See Comments)    Unknown allergic reaction    Social History: The patient  reports that she quit smoking about 38 years ago. Her smoking use included Cigarettes. She has a 37.50 pack-year smoking history. She has never used smokeless tobacco. She reports that she does not drink alcohol or use drugs.   Family History: The patient's family history includes CAD in her brother and father; Diabetes type II in her brother; Stroke in her mother.   Review of Systems: Please see the history of present illness.   Otherwise, the review of systems is positive for none.   All other systems are reviewed and negative.   Physical Exam: VS:  BP 136/86   Pulse (!) 55   Ht 5\' 6"  (1.676 m)   Wt 164 lb 6.4 oz (74.6 kg)   SpO2 95% Comment: at rest  BMI 26.53 kg/m  .  BMI Body mass index is 26.53 kg/m.  Wt Readings from Last 3 Encounters:  11/09/15 164 lb 6.4 oz (74.6 kg)  10/09/15 165 lb (74.8 kg)  09/07/15 169 lb 8 oz (76.9 kg)    General: Pleasant. Elderly female who is alert and in no acute distress.   HEENT: Normal.  Neck: Supple, no JVD,  carotid bruits, or masses noted.  Cardiac: Irregular irregular rhythm. Rate is ok.  [place]at tones are distant. No edema.  Respiratory:  Lungs are clear to auscultation bilaterally with normal work of breathing.  GI: Soft and nontender.  MS: No deformity or atrophy. Gait and ROM intact. She is using a walker. She has a brace on the left foot.  Skin: Warm and dry. Color is normal.  Neuro:  Strength and sensation are intact and no gross focal deficits noted.  Psych: Alert, appropriate and with normal affect.   LABORATORY DATA:  EKG:  EKG is not ordered today.  Lab Results  Component Value Date   WBC 7.4 12/11/2014   HGB 14.5 12/11/2014   HCT 43.3 12/11/2014   PLT 249 12/11/2014   GLUCOSE 103 (H) 12/11/2014   CHOL 392 (H) 12/11/2014   TRIG 373 (H) 12/11/2014   HDL 44 (L) 12/11/2014   LDLDIRECT 236.0 08/02/2014   LDLCALC 273 (H) 12/11/2014   ALT 16 12/11/2014   AST 28 12/11/2014   NA 138 12/11/2014   K 3.9 12/11/2014   CL 97 (L) 12/11/2014   CREATININE 0.86 12/11/2014   BUN 15 12/11/2014   CO2 31 12/11/2014   TSH 0.866 12/11/2014   INR 3.3 10/09/2015   HGBA1C 6.3 (H) 02/14/2013   Lab Results  Component Value Date   INR 3.3 10/09/2015   INR 2.3 09/07/2015   INR 2.2 07/27/2015    BNP (last 3 results) No results for input(s): BNP in the last 8760 hours.  ProBNP (last 3 results) No results for input(s): PROBNP in the last 8760 hours.   Other Studies Reviewed Today:  Myoview Impression from 03/2013 Overall Impression:  The risk of this scan cannot be given because there is no wall motion data. There is a medium-sized area of moderate ischemia affecting the inferior wall in the inferolateral wall. There may be mild scar in the inferior wall.  LV Ejection Fraction: study not gated.  LV Wall Motion:  Wall motion data is not available. The study could not be gated because of the atrial fibrillation.  Dola Argyle, MD  Notes Recorded by Darlin Coco, MD on 03/25/2013  at 9:58 AM Please report. The stress test is similar to the one in July 2014. There is inferior wall scar with surrounding peri-infarct ischemia. Patient is not having any angina. Continue medical treatment ie plavix, warfarin, and BB. Can discuss further at [facility]    Echo Study Conclusions from 02/2013  - Left ventricle: The cavity size was normal. Systolic function was normal. The estimated ejection fraction was in the range of 55% to 60%. Wall motion was normal; there were no regional wall motion abnormalities. - Aortic valve: Mild regurgitation. - Left atrium: The atrium was moderately dilated. - Right atrium: The atrium was mildly to moderately dilated. - Pulmonary arteries: Systolic pressure was moderately increased. PA peak pressure: 3mm Hg (S).  Assessment/Plan: 1. Chronic AF managed with rate control. Has already had her digoxin level drawn. Rate is ok.   2.   Chronic diastolic HF - weight is down. No swelling. Seems compensated.   3. HTN - BP ok on her current regimen  4. HLD - not able to be on statin therapy  5. Memory disorder/gait disorder   6. Chronic anticoagulation - on coumadin and Plavix - not really clear to me either as to why she is on - looks to have been on this regimen at least for the past 5 years. She is falling - this is concerning. Stroke risk remains high but also at high risk for bleeding. Also taking Celebrex.    Current medicines are reviewed with the patient today.  The patient does not have concerns regarding medicines other than what has been noted above.  The following changes have been made:  See above.  Labs/ tests ordered today include:   No orders of the defined types were placed in this encounter.    Disposition:   FU with me in about 3 months. Her overall course looks tenuous to me.   Patient is agreeable to this plan and will call if any problems develop in the interim.   Signed: Burtis Junes, RN, ANP-C 11/09/2015  1:53 PM  [facility] [address] [place], [place]  [address] Phone: [phone] Fax: [phone]

## 2015-11-10 LAB — DIGOXIN LEVEL

## 2015-11-28 ENCOUNTER — Other Ambulatory Visit: Payer: Self-pay | Admitting: Interventional Cardiology

## 2015-11-28 MED ORDER — DIGOXIN 125 MCG PO TABS: ORAL_TABLET | ORAL | 2 refills | Status: DC

## 2015-12-12 ENCOUNTER — Other Ambulatory Visit: Payer: Self-pay | Admitting: Neurology

## 2015-12-13 ENCOUNTER — Telehealth (HOSPITAL_COMMUNITY): Payer: Self-pay

## 2015-12-13 NOTE — Telephone Encounter (Signed)
Called to schedule f/u ultrasound for stenosis, Pt does not want to schedule at this moment. Will try back later. AW

## 2015-12-20 ENCOUNTER — Other Ambulatory Visit (HOSPITAL_COMMUNITY): Payer: Self-pay | Admitting: Interventional Radiology

## 2015-12-20 DIAGNOSIS — I771 Stricture of artery: Secondary | ICD-10-CM

## 2016-01-21 ENCOUNTER — Ambulatory Visit (HOSPITAL_COMMUNITY)
Admission: RE | Admit: 2016-01-21 | Discharge: 2016-01-21 | Disposition: A | Discharge status: Home / Self Care | Payer: Medicare Other | Source: Ambulatory Visit | Attending: Interventional Radiology | Admitting: Interventional Radiology

## 2016-01-21 DIAGNOSIS — I6523 Occlusion and stenosis of bilateral carotid arteries: Secondary | ICD-10-CM | POA: Diagnosis not present

## 2016-01-21 DIAGNOSIS — I771 Stricture of artery: Secondary | ICD-10-CM | POA: Diagnosis not present

## 2016-01-21 LAB — VAS US CAROTID
LCCADDIAS: 13 cm/s
LCCADSYS: 96 cm/s
LCCAPDIAS: 20 cm/s
LEFT ECA DIAS: 7 cm/s
LEFT VERTEBRAL DIAS: 19 cm/s
LICADDIAS: -25 cm/s
LICADSYS: -88 cm/s
LICAPDIAS: -68 cm/s
LICAPSYS: -230 cm/s
Left CCA prox sys: 90 cm/s
RCCAPDIAS: -8 cm/s
RCCAPSYS: -86 cm/s
RIGHT ECA DIAS: 14 cm/s
RIGHT VERTEBRAL DIAS: 8 cm/s
Right cca dist sys: -113 cm/s

## 2016-01-21 NOTE — Progress Notes (Signed)
*  PRELIMINARY RESULTS* Vascular Ultrasound Carotid Duplex (Doppler) has been completed.  Preliminary findings: Right 40-59% ICA stenosis, low end of scale. Left 60-79% ICA stenosis, low end of scale.  Right vertebral is atypical with high resistant flow, suggesting a distal obstruction. Left vertebral is patent with antegrade flow.   [name] Mellow, RDMS, RVT  01/21/2016, 4:33 PM

## 2016-01-28 NOTE — Progress Notes (Deleted)
CARDIOLOGY OFFICE NOTE  Date:  01/28/2016    Amber [name] Date of Birth: May 10, 1934 Medical Record [id number]  PCP:  Gara Kroner, MD  Cardiologist:  Jennings Books   No chief complaint on file.   History of Present Illness: Amber [name] is a 81 y.o. female who presents today for a 3 month check. Seen for Dr. Tamala Julian.   She has a history of coronary artery disease with prior CABG in 2000, chronic atrial fibrillation, chronic diastolic heart failure, chronic kidney disease, stage III, hyperlipidemia (statin intolerant), and essential hypertension. She has had progressive memory loss. She has had remote stroke with residual left hemiparesis. Other issues include fibromyalgia. She apparently has had chronically elevated CK levels. Has an apparent vertebral stenosis.   Seen by Dr. Tamala Julian back in August - had trouble remembering her medicines. BP was up. Dr. Tamala Julian adjusted her medicines. I then saw her in September - she was doing ok. Did not remember Dr. Tamala Julian changing her medicines. BP had improved. She was ok with how she was doing. Remains on Plavix and Coumadin. Looks as if she has been on this at least over the past 5 years.  Comes in today. Here alone today.     Past Medical History:  Diagnosis Date  . Abnormality of gait 08/02/2014  . Atrial fibrillation ([place])    a. chronic/rate controlled;  b. chronic coumadin.  . Cervical spinal stenosis   . Chronic diastolic CHF (congestive heart failure) ([place])    a. 02/2013 Echo: EF 55-60%, mild AI, mod dil LA.  [name] COPD with emphysema ([place])    PFT 05/02/10>>FEV1 1.35(62%), FEV1% 66, DLCO 75%  . Coronary artery disease    a. s/p CABG;  b. 04/2010 Neg MV  . CVA (cerebral vascular accident) ([place])    left sided weakness  . Dementia   . Depression   . Fibromyalgia   . GERD (gastroesophageal reflux disease)    pepcid 2-3 times per week  . Gout   . Hemiparesis and alteration of sensations as late effects of stroke ([place])  01/17/2015  . History of melanoma    squamous cell, melanoma  . Hyperlipidemia   . Hypertension   . Hypothyroidism   . Insomnia   . Intermittent confusion   . Memory change 01/23/2014  . Nasal polyposis   . Osteoarthritis   . Osteoporosis   . Pneumonia    1990    Past Surgical History:  Procedure Laterality Date  . ABDOMINAL HYSTERECTOMY    . BREAST LUMPECTOMY  1980s   Benign lesion - right  . carpel tunnel     right  . CORONARY ARTERY BYPASS GRAFT  2000  . EYE SURGERY     bilateral cataracts  . RADIOLOGY WITH ANESTHESIA  12/24/2011   Procedure: RADIOLOGY WITH ANESTHESIA;  Surgeon: Medication Radiologist, MD;  Location: [facility];  Service: Radiology;  Laterality: N/A;  Extra Cranial Vascular Stent  . RADIOLOGY WITH ANESTHESIA N/A 07/19/2014   Procedure: ANGIOPLASTY;  Surgeon: Luanne Bras, MD;  Location: [facility];  Service: Radiology;  Laterality: N/A;  . RADIOLOGY WITH ANESTHESIA N/A 07/27/2014   Procedure: ANGIOPLASTY;  Surgeon: Luanne Bras, MD;  Location: [facility];  Service: Radiology;  Laterality: N/A;  . TONSILLECTOMY       Medications: Current Outpatient Prescriptions  Medication Sig Dispense Refill  . alendronate (FOSAMAX) 70 MG tablet Take 70 mg by mouth every Sunday. Take with a full glass of water on an  empty stomach.    [name] allopurinol (ZYLOPRIM) 300 MG tablet Take 300 mg by mouth every other day.    [name] amitriptyline (ELAVIL) 10 MG tablet Take 2 tablets (20 mg total) by mouth at bedtime. 180 tablet 3  . amLODipine (NORVASC) 5 MG tablet Take 1 tablet (5 mg total) by mouth at bedtime. 90 tablet 1  . Calcium Carbonate-Vitamin D (CALCIUM + D PO) Take 1 tablet by mouth daily.    . celecoxib (CELEBREX) 200 MG capsule Take 200 mg by mouth 2 (two) times daily.     . citalopram (CELEXA) 20 MG tablet TAKE 1 TABLET BY MOUTH TWICE A DAY 60 tablet 11  . clopidogrel (PLAVIX) 75 MG tablet Take 1 tablet (75 mg total) by mouth daily. 90 tablet 1  . Coenzyme Q10 (CO Q-10 PO) Take 1  capsule by mouth daily.    . colchicine (COLCRYS) 0.6 MG tablet Take 0.6 mg by mouth as needed (gout flare up).     . COUMADIN 5 MG tablet TAKE AS DIRECTED BY COUMADIN CLINIC 135 tablet 1  . digoxin (LANOXIN) 0.125 MG tablet Take 1 tablet (0.125 mg total) by mouth on Mondays, Wednesdays, & Fridays 120 tablet 2  . donepezil (ARICEPT) 5 MG tablet TAKE 1 TABLET (5 MG TOTAL) BY MOUTH DAILY. 90 tablet 90  . ergocalciferol (VITAMIN D2) 50000 UNITS capsule Take 50,000 Units by mouth 2 (two) times a week. Sunday and Wednesday    . furosemide (LASIX) 40 MG tablet Take 1 tablet (40 mg total) by mouth daily. In the PM 30 tablet 1  . furosemide (LASIX) 80 MG tablet Take 1 tablet (80 mg total) by mouth daily. In the AM 30 tablet 1  . levothyroxine (SYNTHROID, LEVOTHROID) 25 MCG tablet Take 25 mcg by mouth daily.    . memantine (NAMENDA XR) 28 MG CP24 24 hr capsule Take 1 capsule (28 mg total) by mouth daily. 90 capsule 3  . metoprolol succinate (TOPROL-XL) 25 MG 24 hr tablet TAKE 1 TABLET (25 MG TOTAL) BY MOUTH DAILY. 90 tablet 2  . Multiple Vitamins-Minerals (MULTIVITAMIN WITH MINERALS) tablet Take 1 tablet by mouth daily.    . nitroGLYCERIN (NITROSTAT) 0.4 MG SL tablet Place 1 tablet (0.4 mg total) under the tongue every 5 (five) minutes as needed for chest pain. 25 tablet 3  . Polyethyl Glycol-Propyl Glycol (SYSTANE OP) Place 1 drop into both eyes daily.     . potassium chloride SA (KLOR-CON M20) 20 MEQ tablet Take 1 tablet (20 mEq total) by mouth 3 (three) times daily. 270 tablet 3  . PROAIR HFA 108 (90 BASE) MCG/ACT inhaler INHALE 2 PUFFS INTO LUNGS EVERY 6 HOURS AS NEEDED FOR WHEEZING 8.5 each 0  . valsartan (DIOVAN) 40 MG tablet TAKE 1 TABLET BY MOUTH TWICE A DAY 180 tablet 3   No current facility-administered medications for this visit.     Allergies: Allergies  Allergen Reactions  . Ezetimibe Other (See Comments)    Myalgia  . Welchol [Colesevelam Hcl] Other (See Comments)    Muscle aches  .  Adhesive [Tape] Itching, Rash and Other (See Comments)    Burning   . Ceclor [Cefaclor] Other (See Comments)    Unknown allergic reaction  . Elastic Bandages & [Zinc] Rash and Other (See Comments)    Turns red on the areas it touches  . Latex Itching, Rash and Other (See Comments)    Burning   . Lipitor [Atorvastatin Calcium] Other (See Comments)  Increased fibromyalgia pain  . Mevacor [Lovastatin] Other (See Comments)    Increased fibromyalgia pain  . Pravachol Other (See Comments)    Increased fibromyalgia pain  . Vasotec Other (See Comments)    Unknown allergic reaction    Social History: The patient  reports that she quit smoking about 38 years ago. Her smoking use included Cigarettes. She has a 37.50 pack-year smoking history. She has never used smokeless tobacco. She reports that she does not drink alcohol or use drugs.   Family History: The patient's family history includes CAD in her brother and father; Diabetes type II in her brother; Stroke in her mother.   Review of Systems: Please see the history of present illness.   Otherwise, the review of systems is positive for none.   All other systems are reviewed and negative.   Physical Exam: VS:  There were no vitals taken for this visit. [name]  BMI There is no height or weight on file to calculate BMI.  Wt Readings from Last 3 Encounters:  11/09/15 164 lb 6.4 oz (74.6 kg)  10/09/15 165 lb (74.8 kg)  09/07/15 169 lb 8 oz (76.9 kg)    General: Pleasant. Well developed, well nourished and in no acute distress.   HEENT: Normal.  Neck: Supple, no JVD, carotid bruits, or masses noted.  Cardiac: Regular rate and rhythm. No murmurs, rubs, or gallops. No edema.  Respiratory:  Lungs are clear to auscultation bilaterally with normal work of breathing.  GI: Soft and nontender.  MS: No deformity or atrophy. Gait and ROM intact.  Skin: Warm and dry. Color is normal.  Neuro:  Strength and sensation are intact and no gross focal  deficits noted.  Psych: Alert, appropriate and with normal affect.   LABORATORY DATA:  EKG:  EKG is not ordered today.  Lab Results  Component Value Date   WBC 7.4 12/11/2014   HGB 14.5 12/11/2014   HCT 43.3 12/11/2014   PLT 249 12/11/2014   GLUCOSE 91 11/09/2015   CHOL 392 (H) 12/11/2014   TRIG 373 (H) 12/11/2014   HDL 44 (L) 12/11/2014   LDLDIRECT 236.0 08/02/2014   LDLCALC 273 (H) 12/11/2014   ALT 16 12/11/2014   AST 28 12/11/2014   NA 138 11/09/2015   K 4.6 11/09/2015   CL 101 11/09/2015   CREATININE 0.97 (H) 11/09/2015   BUN 20 11/09/2015   CO2 26 11/09/2015   TSH 0.866 12/11/2014   INR 3.2 11/09/2015   HGBA1C 6.3 (H) 02/14/2013    BNP (last 3 results) No results for input(s): BNP in the last 8760 hours.  ProBNP (last 3 results) No results for input(s): PROBNP in the last 8760 hours.   Other Studies Reviewed Today:   Assessment/Plan:   Current medicines are reviewed with the patient today.  The patient does not have concerns regarding medicines other than what has been noted above.  The following changes have been made:  See above.  Labs/ tests ordered today include:   No orders of the defined types were placed in this encounter.    Disposition:   FU with *** in {gen number [id number] {Days to years:10300}.   Patient is agreeable to this plan and will call if any problems develop in the interim.   Signed: Burtis Junes, RN, ANP-C 01/28/2016 1:07 PM  [facility] [address] [place], [place]  [address] Phone: [phone] Fax: [phone]

## 2016-01-29 ENCOUNTER — Ambulatory Visit: Payer: Medicare Other | Admitting: Nurse Practitioner

## 2016-02-05 ENCOUNTER — Encounter: Payer: Self-pay | Admitting: Nurse Practitioner

## 2016-02-05 ENCOUNTER — Other Ambulatory Visit: Payer: Self-pay

## 2016-02-05 MED ORDER — VALSARTAN 40 MG PO TABS: 40.0000 mg | ORAL_TABLET | Freq: Two times a day (BID) | ORAL | 0 refills | Status: DC

## 2016-02-06 ENCOUNTER — Other Ambulatory Visit: Payer: Self-pay | Admitting: *Deleted

## 2016-02-06 MED ORDER — VALSARTAN 40 MG PO TABS: 40.0000 mg | ORAL_TABLET | Freq: Two times a day (BID) | ORAL | 0 refills | Status: DC

## 2016-02-20 ENCOUNTER — Other Ambulatory Visit: Payer: Self-pay | Admitting: Interventional Cardiology

## 2016-03-06 ENCOUNTER — Other Ambulatory Visit: Payer: Self-pay

## 2016-03-06 MED ORDER — ALLOPURINOL 300 MG PO TABS: 300.0000 mg | ORAL_TABLET | ORAL | 3 refills | Status: DC

## 2016-03-10 ENCOUNTER — Telehealth (HOSPITAL_COMMUNITY): Payer: Self-pay

## 2016-03-10 NOTE — Telephone Encounter (Signed)
Called daughter to schedule consult, left message to return call. AW

## 2016-03-12 ENCOUNTER — Telehealth (HOSPITAL_COMMUNITY): Payer: Self-pay

## 2016-03-12 NOTE — Telephone Encounter (Signed)
Called to schedule consult, left message for pt to call back. AW 

## 2016-03-13 ENCOUNTER — Other Ambulatory Visit (HOSPITAL_COMMUNITY): Payer: Self-pay | Admitting: Interventional Radiology

## 2016-03-13 DIAGNOSIS — I771 Stricture of artery: Secondary | ICD-10-CM

## 2016-03-25 ENCOUNTER — Other Ambulatory Visit: Payer: Self-pay | Admitting: Interventional Cardiology

## 2016-03-27 ENCOUNTER — Other Ambulatory Visit: Payer: Self-pay | Admitting: Nurse Practitioner

## 2016-03-27 ENCOUNTER — Other Ambulatory Visit: Payer: Self-pay | Admitting: Interventional Cardiology

## 2016-03-27 MED ORDER — DIGOXIN 125 MCG PO TABS: ORAL_TABLET | ORAL | 1 refills | Status: DC

## 2016-03-31 ENCOUNTER — Ambulatory Visit (HOSPITAL_COMMUNITY)
Admission: RE | Admit: 2016-03-31 | Discharge: 2016-03-31 | Disposition: A | Discharge status: Home / Self Care | Payer: Medicare Other | Source: Ambulatory Visit | Attending: Interventional Radiology | Admitting: Interventional Radiology

## 2016-03-31 ENCOUNTER — Other Ambulatory Visit (HOSPITAL_COMMUNITY): Payer: Self-pay | Admitting: Interventional Radiology

## 2016-03-31 ENCOUNTER — Encounter (HOSPITAL_COMMUNITY): Payer: Self-pay | Admitting: Radiology

## 2016-03-31 DIAGNOSIS — I771 Stricture of artery: Secondary | ICD-10-CM

## 2016-03-31 HISTORY — PX: IR GENERIC HISTORICAL: IMG1180011

## 2016-04-08 ENCOUNTER — Ambulatory Visit (HOSPITAL_COMMUNITY): Payer: Medicare Other

## 2016-04-08 ENCOUNTER — Ambulatory Visit (HOSPITAL_COMMUNITY)
Admission: RE | Admit: 2016-04-08 | Discharge: 2016-04-08 | Disposition: A | Discharge status: Home / Self Care | Payer: Medicare Other | Source: Ambulatory Visit | Attending: Interventional Radiology | Admitting: Interventional Radiology

## 2016-04-08 ENCOUNTER — Encounter (HOSPITAL_COMMUNITY): Payer: Self-pay

## 2016-04-08 DIAGNOSIS — I771 Stricture of artery: Secondary | ICD-10-CM

## 2016-04-08 DIAGNOSIS — I6501 Occlusion and stenosis of right vertebral artery: Secondary | ICD-10-CM | POA: Insufficient documentation

## 2016-04-08 DIAGNOSIS — I773 Arterial fibromuscular dysplasia: Secondary | ICD-10-CM | POA: Insufficient documentation

## 2016-04-08 DIAGNOSIS — I6523 Occlusion and stenosis of bilateral carotid arteries: Secondary | ICD-10-CM | POA: Diagnosis not present

## 2016-04-08 LAB — POCT I-STAT CREATININE: CREATININE: 1 mg/dL (ref 0.44–1.00)

## 2016-04-08 MED ORDER — IOPAMIDOL (ISOVUE-370) INJECTION 76%
INTRAVENOUS | Status: AC
  Administered 2016-04-08: 50 mL
  Filled 2016-04-08: qty 50

## 2016-04-09 ENCOUNTER — Ambulatory Visit (INDEPENDENT_AMBULATORY_CARE_PROVIDER_SITE_OTHER): Payer: Medicare Other | Admitting: *Deleted

## 2016-04-09 DIAGNOSIS — I6350 Cerebral infarction due to unspecified occlusion or stenosis of unspecified cerebral artery: Secondary | ICD-10-CM

## 2016-04-09 DIAGNOSIS — I4891 Unspecified atrial fibrillation: Secondary | ICD-10-CM

## 2016-04-09 LAB — POCT INR: INR: 3.4

## 2016-04-11 ENCOUNTER — Other Ambulatory Visit (HOSPITAL_COMMUNITY): Payer: Self-pay | Admitting: Interventional Radiology

## 2016-04-11 DIAGNOSIS — I771 Stricture of artery: Secondary | ICD-10-CM

## 2016-04-15 ENCOUNTER — Ambulatory Visit (HOSPITAL_COMMUNITY)
Admission: RE | Admit: 2016-04-15 | Discharge: 2016-04-15 | Disposition: A | Discharge status: Home / Self Care | Payer: Medicare Other | Source: Ambulatory Visit | Attending: Interventional Radiology | Admitting: Interventional Radiology

## 2016-04-15 ENCOUNTER — Other Ambulatory Visit (HOSPITAL_COMMUNITY): Payer: Self-pay | Admitting: Interventional Radiology

## 2016-04-15 DIAGNOSIS — I771 Stricture of artery: Secondary | ICD-10-CM

## 2016-04-15 DIAGNOSIS — I639 Cerebral infarction, unspecified: Secondary | ICD-10-CM

## 2016-04-18 ENCOUNTER — Encounter (HOSPITAL_COMMUNITY): Payer: Self-pay | Admitting: Interventional Radiology

## 2016-04-23 ENCOUNTER — Other Ambulatory Visit: Payer: Self-pay | Admitting: *Deleted

## 2016-04-23 ENCOUNTER — Encounter: Payer: Self-pay | Admitting: Interventional Cardiology

## 2016-04-23 DIAGNOSIS — I4821 Permanent atrial fibrillation: Secondary | ICD-10-CM

## 2016-04-23 DIAGNOSIS — Z7901 Long term (current) use of anticoagulants: Secondary | ICD-10-CM

## 2016-04-23 DIAGNOSIS — E785 Hyperlipidemia, unspecified: Secondary | ICD-10-CM

## 2016-04-23 DIAGNOSIS — I5032 Chronic diastolic (congestive) heart failure: Secondary | ICD-10-CM

## 2016-04-23 DIAGNOSIS — I2510 Atherosclerotic heart disease of native coronary artery without angina pectoris: Secondary | ICD-10-CM

## 2016-04-23 MED ORDER — FUROSEMIDE 40 MG PO TABS: 40.0000 mg | ORAL_TABLET | Freq: Every day | ORAL | 1 refills | Status: DC

## 2016-04-24 ENCOUNTER — Other Ambulatory Visit: Payer: Self-pay | Admitting: Interventional Cardiology

## 2016-05-01 NOTE — Progress Notes (Signed)
Cardiology Office Note    Date:  05/02/2016   ID:  Amber, [name] 07/03/1934, MRN [id number]  PCP:  Gara Kroner, MD  Cardiologist: Sinclair Grooms, MD   Chief Complaint  Patient presents with  . Atrial Fibrillation    History of Present Illness:  Amber [name] is a 82 y.o. female with a history of coronary artery disease with prior CABG in 2000, chronic atrial fibrillation, chronic diastolic heart failure, chronic kidney disease, stage III, hyperlipidemia (statin intolerant), and essential hypertension with hypertensive heart disease. Also has cerebrovascular disease and dementia.  The daughter who is an Therapist, sports and former interventional radiology nurse is with Amber [name] today. They are contemplating having a stent placed in her  Right vertebral artery by Dr. Patrecia Pour The patient's major complaints are loss of memory and hypersomnolence. There are no focal or transient neurological complaints. She denies chest pain. There is a history of remote stroke. She is on Coumadin and Plavix without bleeding. She denies dyspnea and chest pain. Overall cardiovascular status has been stable according to the patient and her daughter.  Past Medical History:  Diagnosis Date  . Abnormality of gait 08/02/2014  . Atrial fibrillation ([place])    a. chronic/rate controlled;  b. chronic coumadin.  . Cervical spinal stenosis   . Chronic diastolic CHF (congestive heart failure) ([place])    a. 02/2013 Echo: EF 55-60%, mild AI, mod dil LA.  [name] COPD with emphysema ([place])    PFT 05/02/10>>FEV1 1.35(62%), FEV1% 66, DLCO 75%  . Coronary artery disease    a. s/p CABG;  b. 04/2010 Neg MV  . CVA (cerebral vascular accident) ([place])    left sided weakness  . Dementia   . Depression   . Fibromyalgia   . GERD (gastroesophageal reflux disease)    pepcid 2-3 times per week  . Gout   . Hemiparesis and alteration of sensations as late effects of stroke ([place]) 01/17/2015  . History of melanoma    squamous cell, melanoma    . Hyperlipidemia   . Hypertension   . Hypothyroidism   . Insomnia   . Intermittent confusion   . Memory change 01/23/2014  . Nasal polyposis   . Osteoarthritis   . Osteoporosis   . Pneumonia    1990    Past Surgical History:  Procedure Laterality Date  . ABDOMINAL HYSTERECTOMY    . BREAST LUMPECTOMY  1980s   Benign lesion - right  . carpel tunnel     right  . CORONARY ARTERY BYPASS GRAFT  2000  . EYE SURGERY     bilateral cataracts  . IR GENERIC HISTORICAL  03/31/2016   IR RADIOLOGIST EVAL & MGMT 03/31/2016 [facility]  . RADIOLOGY WITH ANESTHESIA  12/24/2011   Procedure: RADIOLOGY WITH ANESTHESIA;  Surgeon: Medication Radiologist, MD;  Location: [facility];  Service: Radiology;  Laterality: N/A;  Extra Cranial Vascular Stent  . RADIOLOGY WITH ANESTHESIA N/A 07/19/2014   Procedure: ANGIOPLASTY;  Surgeon: Luanne Bras, MD;  Location: [facility];  Service: Radiology;  Laterality: N/A;  . RADIOLOGY WITH ANESTHESIA N/A 07/27/2014   Procedure: ANGIOPLASTY;  Surgeon: Luanne Bras, MD;  Location: [facility];  Service: Radiology;  Laterality: N/A;  . TONSILLECTOMY      Current Medications: Outpatient Medications Prior to Visit  Medication Sig Dispense Refill  . alendronate (FOSAMAX) 70 MG tablet Take 70 mg by mouth every Sunday. Take with a full glass of water on an empty stomach.    [name]  allopurinol (ZYLOPRIM) 300 MG tablet Take 1 tablet (300 mg total) by mouth every other day. 45 tablet 3  . amitriptyline (ELAVIL) 10 MG tablet Take 2 tablets (20 mg total) by mouth at bedtime. 180 tablet 3  . amLODipine (NORVASC) 5 MG tablet TAKE 1 TABLET BY MOUTH AT BEDTIME 90 tablet 1  . Calcium Carbonate-Vitamin D (CALCIUM + D PO) Take 1 tablet by mouth daily.    . celecoxib (CELEBREX) 200 MG capsule Take 200 mg by mouth 2 (two) times daily.     . citalopram (CELEXA) 20 MG tablet TAKE 1 TABLET BY MOUTH TWICE A DAY 60 tablet 11  . clopidogrel (PLAVIX) 75 MG tablet TAKE 1 TABLET (75 MG TOTAL) BY MOUTH  DAILY. 90 tablet 1  . Coenzyme Q10 (CO Q-10 PO) Take 1 capsule by mouth daily.    . colchicine (COLCRYS) 0.6 MG tablet Take 0.6 mg by mouth as needed (gout flare up).     . COUMADIN 5 MG tablet TAKE AS DIRECTED BY COUMADIN CLINIC 135 tablet 1  . digoxin (LANOXIN) 0.125 MG tablet Take 1 tablet (0.125 mg total) by mouth on Mondays, Wednesdays, & Fridays 36 tablet 1  . donepezil (ARICEPT) 5 MG tablet TAKE 1 TABLET (5 MG TOTAL) BY MOUTH DAILY. 90 tablet 90  . ergocalciferol (VITAMIN D2) 50000 UNITS capsule Take 50,000 Units by mouth 2 (two) times a week. Sunday and Wednesday    . levothyroxine (SYNTHROID, LEVOTHROID) 25 MCG tablet Take 25 mcg by mouth daily.    . memantine (NAMENDA XR) 28 MG CP24 24 hr capsule Take 1 capsule (28 mg total) by mouth daily. 90 capsule 3  . metoprolol succinate (TOPROL-XL) 25 MG 24 hr tablet TAKE 1 TABLET (25 MG TOTAL) BY MOUTH DAILY. 90 tablet 2  . Multiple Vitamins-Minerals (MULTIVITAMIN WITH MINERALS) tablet Take 1 tablet by mouth daily.    . nitroGLYCERIN (NITROSTAT) 0.4 MG SL tablet Place 1 tablet (0.4 mg total) under the tongue every 5 (five) minutes as needed for chest pain. 25 tablet 3  . Polyethyl Glycol-Propyl Glycol (SYSTANE OP) Place 1 drop into both eyes daily.     [name] PROAIR HFA 108 (90 BASE) MCG/ACT inhaler INHALE 2 PUFFS INTO LUNGS EVERY 6 HOURS AS NEEDED FOR WHEEZING 8.5 each 0  . valsartan (DIOVAN) 40 MG tablet TAKE 1 TABLET (40 MG TOTAL) BY MOUTH 2 (TWO) TIMES DAILY. 60 tablet 6  . furosemide (LASIX) 80 MG tablet Take 1 tablet (80 mg total) by mouth daily. In the AM 30 tablet 1  . furosemide (LASIX) 40 MG tablet Take 1 tablet (40 mg total) by mouth daily. In the PM 90 tablet 1  . KLOR-CON M20 20 MEQ tablet TAKE 1 TABLET BY MOUTH 3 TIMES A DAY 270 tablet 1   No facility-administered medications prior to visit.      Allergies:   Ezetimibe; Welchol [colesevelam hcl]; Adhesive [tape]; Ceclor [cefaclor]; Elastic bandages & [zinc]; Latex; Lipitor  [atorvastatin calcium]; Mevacor [lovastatin]; Pravachol; and Vasotec   Social History   Social History  . Marital status: Married    Spouse name: N/A  . Number of children: 2  . Years of education: N/A   Occupational History  . [place] [place]  . Secretary     Retired   Social History Main Topics  . Smoking status: Former Smoker    Packs/day: 1.50    Years: 25.00    Types: Cigarettes    Quit date: 02/10/1977  .  Smokeless tobacco: Never Used  . Alcohol use No  . Drug use: No  . Sexual activity: Not Asked   Other Topics Concern  . None   Social History Narrative   Patient is right handed.   Patient lives at home with husband   Patient drinks 8 oz caffeine occasionally.     Family History:  The patient's family history includes CAD in her brother and father; Diabetes type II in her brother; Stroke in her mother.   ROS:   Please see the history of present illness.    Hearing loss, decreased memory, easy bruising, decreased memory, anxiety.  All other systems reviewed and are negative.   PHYSICAL EXAM:   VS:  BP (!) 152/76 (BP Location: Right Arm)   Pulse 67   Ht 5\' 6"  (1.676 m)   Wt 159 lb 12.8 oz (72.5 kg)   BMI 25.79 kg/m    GEN: Well nourished, well developed, in no acute distress  HEENT: normal  Neck: no JVD, carotid bruits, or masses Cardiac: IIRR; no murmurs, rubs, or gallops,no edema  Respiratory:  clear to auscultation bilaterally, normal work of breathing GI: soft, nontender, nondistended, + BS MS: no deformity or atrophy  Skin: warm and dry, no rash Neuro:  Alert and Oriented x 3, Strength and sensation are intact Psych: euthymic mood, full affect  Wt Readings from Last 3 Encounters:  05/02/16 159 lb 12.8 oz (72.5 kg)  11/09/15 164 lb 6.4 oz (74.6 kg)  10/09/15 165 lb (74.8 kg)      Studies/Labs Reviewed:   EKG:  EKG  Not done  Recent Labs: 11/09/2015: BUN 20; Potassium 4.6; Sodium 138 04/08/2016: Creatinine, Ser  1.00   Lipid Panel    Component Value Date/Time   CHOL 392 (H) 12/11/2014 1644   TRIG 373 (H) 12/11/2014 1644   HDL 44 (L) 12/11/2014 1644   CHOLHDL 8.9 (H) 12/11/2014 1644   VLDL 75 (H) 12/11/2014 1644   LDLCALC 273 (H) 12/11/2014 1644   LDLDIRECT 236.0 08/02/2014 1418    Additional studies/ records that were reviewed today include:  Carotid Doppler 01/21/16: Summary:  - Findings consistent with 40 - 59 percent stenosis, low end of   scale, involving the right internal carotid artery. - Findings consistent with 60 - 79 percent stenosis, low end of   scale, involving the left internal carotid artery. - Right vertebral is atypical with high resistant flow, suggestive   of a distal obstruction.   Left vertebral is patent with antegrade flow. - Appears to be increase in stenosis bilaterally compared to   previous study 03/19/15: right 1-39% left 40-59%  CT angiogram, Head and Neck 04/08/16  IMPRESSION: Borderline flow reducing stenosis, LEFT ICA, 67%, predominantly calcific, progressive from priors.  High-grade stenosis LEFT vertebral origin, 75-90%, also progressive.  50% stenosis RIGHT ICA origin.  Suspected mild BILATERAL fibromuscular dysplasia cervical ICAs.  Segmental RIGHT vertebral occlusions, stable from priors.  No significant intracranial stenosis.   ASSESSMENT:    1. Chronic atrial fibrillation ([place])   2. Hypertensive heart and chronic kidney disease with heart failure and stage 1 through stage 4 chronic kidney disease, or chronic kidney disease ([place])   3. Coronary artery disease involving coronary bypass graft of native heart with angina pectoris ([place])   4. Chronic diastolic heart failure ([place])   5. Hemiparesis and alteration of sensations as late effects of stroke ([place])   6. Memory change   7. Dyslipidemia   8. Long-term (current) use  of anticoagulants      PLAN:  In order of problems listed above:  1. Good rate control. No apparent symptoms.  On anticoagulation without bleeding complications. 2. No evidence of I am overload. Blood pressure is a little higher than target. The goal is 140/90 mmHg or less. 3. Asymptomatic without angina or other CV complaints. 4. No evidence of volume overload. 5. Will refer back to neurology for an opinion concerning cerebrovascular disease. My recommendation is that they hold off on any invasive procedure in absence of symptoms given the data documented above. 6. Follow-up with neurology 7. The only recorded lipid levels in our system are extremely abnormal. The patient has a statin intolerance. We will need to get her seen by our lipid clinic and consider PCS K9 therapy.  Clinical follow-up in 9-12 months. Hold off on any invasive neurovascular procedures until seen by neurology.  Medication Adjustments/Labs and Tests Ordered: Current medicines are reviewed at length with the patient today.  Concerns regarding medicines are outlined above.  Medication changes, Labs and Tests ordered today are listed in the Patient Instructions below. Patient Instructions  Medication Instructions:  None  Labwork: None  Testing/Procedures: None  Follow-Up: Your physician wants you to follow-up in: 9-12 months with Dr. Tamala Julian. You will receive a reminder letter in the mail two months in advance. If you don't receive a letter, please call our office to schedule the follow-up appointment.   Any Other Special Instructions Will Be Listed Below (If Applicable).  You have been referred to Neurology (Dr. Jannifer Franklin) [facility] [place], [place] [address] [phone]   If you need a refill on your cardiac medications before your next appointment, please call your pharmacy.      Signed, Sinclair Grooms, MD  05/02/2016 5:33 PM    [address] [address], [place], [place]  [address] Phone: [phone]; Fax: [phone]

## 2016-05-02 ENCOUNTER — Other Ambulatory Visit: Payer: Self-pay | Admitting: Radiology

## 2016-05-02 ENCOUNTER — Ambulatory Visit (INDEPENDENT_AMBULATORY_CARE_PROVIDER_SITE_OTHER): Payer: Medicare Other | Admitting: *Deleted

## 2016-05-02 ENCOUNTER — Ambulatory Visit (INDEPENDENT_AMBULATORY_CARE_PROVIDER_SITE_OTHER): Payer: Medicare Other | Admitting: Interventional Cardiology

## 2016-05-02 ENCOUNTER — Encounter (INDEPENDENT_AMBULATORY_CARE_PROVIDER_SITE_OTHER): Payer: Self-pay

## 2016-05-02 ENCOUNTER — Encounter: Payer: Self-pay | Admitting: Interventional Cardiology

## 2016-05-02 VITALS — BP 152/76 | HR 67 | Ht 66.0 in | Wt 159.8 lb

## 2016-05-02 DIAGNOSIS — I4891 Unspecified atrial fibrillation: Secondary | ICD-10-CM

## 2016-05-02 DIAGNOSIS — E785 Hyperlipidemia, unspecified: Secondary | ICD-10-CM

## 2016-05-02 DIAGNOSIS — I25709 Atherosclerosis of coronary artery bypass graft(s), unspecified, with unspecified angina pectoris: Secondary | ICD-10-CM

## 2016-05-02 DIAGNOSIS — I69359 Hemiplegia and hemiparesis following cerebral infarction affecting unspecified side: Secondary | ICD-10-CM

## 2016-05-02 DIAGNOSIS — Z7901 Long term (current) use of anticoagulants: Secondary | ICD-10-CM

## 2016-05-02 DIAGNOSIS — I130 Hypertensive heart and chronic kidney disease with heart failure and stage 1 through stage 4 chronic kidney disease, or unspecified chronic kidney disease: Secondary | ICD-10-CM | POA: Diagnosis not present

## 2016-05-02 DIAGNOSIS — I5032 Chronic diastolic (congestive) heart failure: Secondary | ICD-10-CM

## 2016-05-02 DIAGNOSIS — I482 Chronic atrial fibrillation, unspecified: Secondary | ICD-10-CM

## 2016-05-02 DIAGNOSIS — I69398 Other sequelae of cerebral infarction: Secondary | ICD-10-CM

## 2016-05-02 DIAGNOSIS — I6350 Cerebral infarction due to unspecified occlusion or stenosis of unspecified cerebral artery: Secondary | ICD-10-CM | POA: Diagnosis not present

## 2016-05-02 DIAGNOSIS — R413 Other amnesia: Secondary | ICD-10-CM

## 2016-05-02 LAB — POCT INR: INR: 3.3

## 2016-05-02 MED ORDER — ENOXAPARIN SODIUM 80 MG/0.8ML ~~LOC~~ SOLN: 80.0000 mg | Freq: Two times a day (BID) | SUBCUTANEOUS | 1 refills | Status: DC

## 2016-05-02 NOTE — Patient Instructions (Signed)
Medication Instructions:  None  Labwork: None  Testing/Procedures: None  Follow-Up: Your physician wants you to follow-up in: 9-12 months with Dr. Tamala Julian. You will receive a reminder letter in the mail two months in advance. If you don't receive a letter, please call our office to schedule the follow-up appointment.   Any Other Special Instructions Will Be Listed Below (If Applicable).  You have been referred to Neurology (Dr. Jannifer Franklin) [facility] [place], [place] [address] [phone]   If you need a refill on your cardiac medications before your next appointment, please call your pharmacy.

## 2016-05-09 ENCOUNTER — Other Ambulatory Visit: Payer: Self-pay | Admitting: Interventional Cardiology

## 2016-05-12 ENCOUNTER — Telehealth: Payer: Self-pay | Admitting: *Deleted

## 2016-05-12 ENCOUNTER — Telehealth: Payer: Self-pay | Admitting: Neurology

## 2016-05-12 NOTE — Telephone Encounter (Signed)
I do not see any openings as of right now.

## 2016-05-12 NOTE — Telephone Encounter (Signed)
Pt daughter called stating that the pt cardiologist suggested pt see her neurologist before she is considered for Stints in her Cerebral artories. Pt has been put on wait call list but due to condition of pt. Pt daughter would like a call to see if a nurse can get Pt in with Dr Jannifer Franklin before scheduled appointment

## 2016-05-12 NOTE — Telephone Encounter (Signed)
Pt was doing her medication but i spoke with  Melanee Left and she will take over her mothers medications, The pt was taking Allopurinol QD & it is QOD, confirmed the DPR understanding of this, will call [facility] & send refill.  pt has enough refills on file to last a year per Melody at [facility]. Confirmed RX instructions with her as well.

## 2016-05-13 NOTE — Telephone Encounter (Signed)
Called daughter and scheduled appt for 05/22/16 at 10am, check in 930am. She verbalized understanding and appreciation.

## 2016-05-14 ENCOUNTER — Ambulatory Visit (HOSPITAL_COMMUNITY): Payer: Medicare Other

## 2016-05-16 ENCOUNTER — Other Ambulatory Visit: Payer: Self-pay | Admitting: Interventional Cardiology

## 2016-05-21 ENCOUNTER — Ambulatory Visit (INDEPENDENT_AMBULATORY_CARE_PROVIDER_SITE_OTHER): Payer: Medicare Other | Admitting: Neurology

## 2016-05-21 ENCOUNTER — Encounter: Payer: Self-pay | Admitting: Neurology

## 2016-05-21 ENCOUNTER — Ambulatory Visit (INDEPENDENT_AMBULATORY_CARE_PROVIDER_SITE_OTHER): Payer: Medicare Other | Admitting: *Deleted

## 2016-05-21 VITALS — BP 181/61 | HR 55 | Ht 66.0 in | Wt 161.5 lb

## 2016-05-21 DIAGNOSIS — I4891 Unspecified atrial fibrillation: Secondary | ICD-10-CM | POA: Diagnosis not present

## 2016-05-21 DIAGNOSIS — I679 Cerebrovascular disease, unspecified: Secondary | ICD-10-CM

## 2016-05-21 DIAGNOSIS — I6350 Cerebral infarction due to unspecified occlusion or stenosis of unspecified cerebral artery: Secondary | ICD-10-CM | POA: Diagnosis not present

## 2016-05-21 DIAGNOSIS — R413 Other amnesia: Secondary | ICD-10-CM

## 2016-05-21 HISTORY — DX: Cerebrovascular disease, unspecified: I67.9

## 2016-05-21 LAB — POCT INR: INR: 2

## 2016-05-21 NOTE — Patient Instructions (Signed)
    We will check MRI of the brain. 

## 2016-05-21 NOTE — Progress Notes (Signed)
Reason for visit: Cerebrovascular disease  Amber [name] is an 82 y.o. female  History of present illness:  Amber [name] is a 82 year old right-handed white female with a history of cerebrovascular disease. The patient has had occlusion of the origin of the right vertebral artery, she has sustained in the past a pontine infarct with a left hemiparesis. The patient has been clinically stable over a number of years. The patient has been followed by Dr. Estanislado Pandy with CT angiography. The patient last had a cerebral angiogram done in 2016. This showed evidence of stenosis of the origin of the left vertebral artery in the 70-75% range. Approximately 50% stenosis of the left internal carotid artery was noted. The patient just recently had a CT angiogram of the head and neck that revealed up to 67% stenosis of the left internal carotid artery and 75-90% stenosis of the origin of the left vertebral artery. The study suggests progression of her stenosis, and a stenting procedure of the vertebral artery on the left was recommended. The patient clinically has not had any definite evidence of vertebrobasilar insufficiency. The daughter who comes with the patient today indicates that she had a sudden change in memory in the Fall of 2017. The patient has been much more forgetful since that time. She is now having problems managing her checkbook. In the summer of 2017, the patient scored 30/30 on the Mini-Mental Status Examination. The patient comes to the office today for an opinion regarding whether the left vertebral artery should be stented or not.  Past Medical History:  Diagnosis Date  . Abnormality of gait 08/02/2014  . Atrial fibrillation ([place])    a. chronic/rate controlled;  b. chronic coumadin.  [name] Cerebrovascular disease 05/21/2016  . Cervical spinal stenosis   . Chronic diastolic CHF (congestive heart failure) ([place])    a. 02/2013 Echo: EF 55-60%, mild AI, mod dil LA.  [name] COPD with emphysema ([place])    PFT 05/02/10>>FEV1 1.35(62%), FEV1% 66, DLCO 75%  . Coronary artery disease    a. s/p CABG;  b. 04/2010 Neg MV  . CVA (cerebral vascular accident) ([place])    left sided weakness  . Dementia   . Depression   . Fibromyalgia   . GERD (gastroesophageal reflux disease)    pepcid 2-3 times per week  . Gout   . Hemiparesis and alteration of sensations as late effects of stroke ([place]) 01/17/2015  . History of melanoma    squamous cell, melanoma  . Hyperlipidemia   . Hypertension   . Hypothyroidism   . Insomnia   . Intermittent confusion   . Memory change 01/23/2014  . Nasal polyposis   . Osteoarthritis   . Osteoporosis   . Pneumonia    1990    Past Surgical History:  Procedure Laterality Date  . ABDOMINAL HYSTERECTOMY    . BREAST LUMPECTOMY  1980s   Benign lesion - right  . carpel tunnel     right  . CORONARY ARTERY BYPASS GRAFT  2000  . EYE SURGERY     bilateral cataracts  . IR GENERIC HISTORICAL  03/31/2016   IR RADIOLOGIST EVAL & MGMT 03/31/2016 [facility]  . RADIOLOGY WITH ANESTHESIA  12/24/2011   Procedure: RADIOLOGY WITH ANESTHESIA;  Surgeon: Medication Radiologist, MD;  Location: [facility];  Service: Radiology;  Laterality: N/A;  Extra Cranial Vascular Stent  . RADIOLOGY WITH ANESTHESIA N/A 07/19/2014   Procedure: ANGIOPLASTY;  Surgeon: Luanne Bras, MD;  Location: [facility];  Service:  Radiology;  Laterality: N/A;  . RADIOLOGY WITH ANESTHESIA N/A 07/27/2014   Procedure: ANGIOPLASTY;  Surgeon: Luanne Bras, MD;  Location: [facility];  Service: Radiology;  Laterality: N/A;  . TONSILLECTOMY      Family History  Problem Relation Age of Onset  . Stroke Mother   . CAD Father   . Diabetes type II Brother   . CAD Brother     Social history:  reports that she quit smoking about 39 years ago. Her smoking use included Cigarettes. She has a 37.50 pack-year smoking history. She has never used smokeless tobacco. She reports that she does not drink alcohol or use drugs.    Allergies    Allergen Reactions  . Ezetimibe Other (See Comments)    Myalgia  . Welchol [Colesevelam Hcl] Other (See Comments)    Muscle aches  . Adhesive [Tape] Itching, Rash and Other (See Comments)    Burning   . Ceclor [Cefaclor] Other (See Comments)    Unknown allergic reaction  . Elastic Bandages & [Zinc] Rash and Other (See Comments)    Turns red on the areas it touches  . Latex Itching, Rash and Other (See Comments)    Burning   . Lipitor [Atorvastatin Calcium] Other (See Comments)    Increased fibromyalgia pain  . Mevacor [Lovastatin] Other (See Comments)    Increased fibromyalgia pain  . Pravachol Other (See Comments)    Increased fibromyalgia pain  . Vasotec Other (See Comments)    Unknown allergic reaction    Medications:  Prior to Admission medications   Medication Sig Start Date End Date Taking? Authorizing Provider  allopurinol (ZYLOPRIM) 300 MG tablet Take 1 tablet (300 mg total) by mouth every other day. 03/06/16  Yes Burtis Junes, NP  amitriptyline (ELAVIL) 10 MG tablet Take 2 tablets (20 mg total) by mouth at bedtime. 09/07/15  Yes Kathrynn Ducking, MD  amLODipine (NORVASC) 5 MG tablet TAKE 1 TABLET BY MOUTH AT BEDTIME 04/25/16  Yes Belva Crome, MD  celecoxib (CELEBREX) 200 MG capsule Take 100 mg by mouth every morning.    Yes Historical Provider, MD  citalopram (CELEXA) 20 MG tablet TAKE 1 TABLET BY MOUTH TWICE A DAY 11/09/14  Yes Darlin Coco, MD  clopidogrel (PLAVIX) 75 MG tablet TAKE 1 TABLET (75 MG TOTAL) BY MOUTH DAILY. 02/21/16  Yes Belva Crome, MD  colchicine (COLCRYS) 0.6 MG tablet Take 0.6 mg by mouth as needed (gout flare up).  05/21/10  Yes Historical Provider, MD  COUMADIN 5 MG tablet TAKE AS DIRECTED BY COUMADIN CLINIC 05/12/16  Yes Belva Crome, MD  digoxin Fonnie [place]) 0.125 MG tablet Take 1 tablet (0.125 mg total) by mouth on Mondays, Wednesdays, & Fridays 03/27/16  Yes Belva Crome, MD  donepezil (ARICEPT) 5 MG tablet TAKE 1 TABLET (5 MG TOTAL) BY MOUTH  DAILY. 12/12/15  Yes Kathrynn Ducking, MD  ergocalciferol (VITAMIN D2) 50000 UNITS capsule Take 50,000 Units by mouth 2 (two) times a week. Sunday and Wednesday   Yes Historical Provider, MD  KLOR-CON M20 20 MEQ tablet Take 20 mEq by mouth 2 (two) times daily.  03/11/16  Yes Historical Provider, MD  levothyroxine (SYNTHROID, LEVOTHROID) 25 MCG tablet Take 25 mcg by mouth every morning.    Yes Historical Provider, MD  memantine (NAMENDA XR) 28 MG CP24 24 hr capsule Take 1 capsule (28 mg total) by mouth daily. 09/07/15  Yes Kathrynn Ducking, MD  metoprolol succinate (TOPROL-XL) 25 MG 24 hr  tablet TAKE 1 TABLET (25 MG TOTAL) BY MOUTH DAILY. Patient taking differently: TAKE 1 TABLET (25 MG TOTAL) BY MOUTH DAILY. Morning 05/16/16  Yes Belva Crome, MD  PROAIR HFA 108 [phone] BASE) MCG/ACT inhaler INHALE 2 PUFFS INTO LUNGS EVERY 6 HOURS AS NEEDED FOR WHEEZING 12/22/13  Yes Darlin Coco, MD  valsartan (DIOVAN) 40 MG tablet TAKE 1 TABLET (40 MG TOTAL) BY MOUTH 2 (TWO) TIMES DAILY. 03/27/16  Yes Burtis Junes, NP  alendronate (FOSAMAX) 70 MG tablet Take 70 mg by mouth every Sunday. Take with a full glass of water on an empty stomach.    Historical Provider, MD  furosemide (LASIX) 80 MG tablet Take 1 tablet (80 mg total) by mouth daily. In the AM 10/09/15 01/07/16  Belva Crome, MD  nitroGLYCERIN (NITROSTAT) 0.4 MG SL tablet Place 1 tablet (0.4 mg total) under the tongue every 5 (five) minutes as needed for chest pain. Patient not taking: Reported on 05/21/2016 09/13/12   Darlin Coco, MD    ROS:  Out of a complete 14 system review of symptoms, the patient complains only of the following symptoms, and all other reviewed systems are negative:   Memory disturbance Bruising easily  Blood pressure (!) 181/61, pulse (!) 55, height 5\' 6"  (1.676 m), weight 161 lb 8 oz (73.3 kg), SpO2 97 %.  Physical Exam  General: The patient is alert and cooperative at the time of the examination.  Skin: No significant  peripheral edema is noted.   Neurologic Exam  Mental status: The patient is alert and oriented x 3 at the time of the examination. The Mini-Mental Status Examination done today shows a total score 26/30.   Cranial nerves: Facial symmetry is present. Speech is normal, no aphasia or dysarthria is noted. Extraocular movements are full. Visual fields are full.  Motor: The patient has good strength in all 4 extremities, with exception of some mild weakness with grip and flexion-extension the elbow on the left arm. The patient wears a brace on the left leg across the ankle .  Sensory examination: Soft touch sensation is symmetric on the face, arms, and legs.  Coordination: The patient has good finger-nose-finger and heel-to-shin bilaterally.  Gait and station: The patient has a circumduction type gait with the left leg. The patient normally uses a walker for ambulation. Tandem gait was not attempted.  Romberg is negative. No drift is seen.  Reflexes: Deep tendon reflexes are symmetric.   Assessment/Plan:  1. Cerebrovascular disease, prior pontine stroke, left hemiparesis  2. Cerebrovascular disease, left vertebral artery stenosis  The patient has had a comparison of a cerebral angiogram that was done in 2016 with a CT angiogram that was done in 2018. There appears to be some progression of the left internal carotid artery and left vertebral artery origin stenosis. The studies however, are different, and a direct comparison in the degree of stenosis of various blood vessels may be difficult. Calcified plaque may cause the CT angiogram to overcall a stenotic area. Also, the patient has no definite clinical correlation with worsening stenosis of the posterior circulation. There is some indication of a sudden change in memory in the fall 2017. We will check MRI of the brain to look for any new cerebrovascular events that would suggest a posterior circulation stroke. I will contact the patient with  results of the MRI.    Jill Alexanders MD 05/21/2016 5:39 PM  [facility] [address] [place], [place] [address]  Phone [phone] Fax [phone]  

## 2016-05-22 ENCOUNTER — Ambulatory Visit: Payer: Self-pay | Admitting: Neurology

## 2016-05-27 ENCOUNTER — Telehealth: Payer: Self-pay | Admitting: Neurology

## 2016-05-27 NOTE — Telephone Encounter (Signed)
I called the patient. I discussed the case with Dr. Erlinda Hong. He indicated that there is no data that would support a stenting procedure of vertebral artery in an asymptomatic patient. If the patient does become symptomatic, stenting may be indicated.

## 2016-06-04 ENCOUNTER — Ambulatory Visit (INDEPENDENT_AMBULATORY_CARE_PROVIDER_SITE_OTHER): Payer: Medicare Other | Admitting: *Deleted

## 2016-06-04 DIAGNOSIS — I6350 Cerebral infarction due to unspecified occlusion or stenosis of unspecified cerebral artery: Secondary | ICD-10-CM

## 2016-06-04 DIAGNOSIS — I4891 Unspecified atrial fibrillation: Secondary | ICD-10-CM | POA: Diagnosis not present

## 2016-06-04 LAB — POCT INR: INR: 1.6

## 2016-06-12 ENCOUNTER — Ambulatory Visit
Admission: RE | Admit: 2016-06-12 | Discharge: 2016-06-12 | Disposition: A | Discharge status: Home / Self Care | Payer: Medicare Other | Source: Ambulatory Visit | Attending: Neurology | Admitting: Neurology

## 2016-06-12 DIAGNOSIS — I679 Cerebrovascular disease, unspecified: Secondary | ICD-10-CM

## 2016-06-12 DIAGNOSIS — R413 Other amnesia: Secondary | ICD-10-CM

## 2016-06-13 ENCOUNTER — Telehealth: Payer: Self-pay | Admitting: Neurology

## 2016-06-13 MED ORDER — DONEPEZIL HCL 10 MG PO TABS: 10.0000 mg | ORAL_TABLET | Freq: Every day | ORAL | 5 refills | Status: DC

## 2016-06-13 NOTE — Telephone Encounter (Signed)
I called and talked with the daughter. The MRI the brain really shows minimal small vessel disease changes, the patient has atrophy in a pattern that could be consistent with Alzheimer's disease. We will increase the Aricept to 10 mg at night.   MRI brain 04/13/16:  IMPRESSION:  This MRI of the brain without contrast shows the following: 1.    Moderately severe cortical atrophy that is most pronounced in the mesial temporal lobes. This has slightly progressed when compared to the previous MRI. Stable moderate cerebellar atrophy. 2.    Chronic microvascular ischemic changes, slightly progressed when compared to the 2016 MRI. 3.    Reduced flow in the right vertebral artery. 4.    There are no acute findings.

## 2016-06-18 ENCOUNTER — Ambulatory Visit (INDEPENDENT_AMBULATORY_CARE_PROVIDER_SITE_OTHER): Payer: Medicare Other | Admitting: *Deleted

## 2016-06-18 DIAGNOSIS — I4891 Unspecified atrial fibrillation: Secondary | ICD-10-CM

## 2016-06-18 DIAGNOSIS — I6350 Cerebral infarction due to unspecified occlusion or stenosis of unspecified cerebral artery: Secondary | ICD-10-CM

## 2016-06-18 LAB — POCT INR: INR: 2.5

## 2016-07-09 ENCOUNTER — Ambulatory Visit (INDEPENDENT_AMBULATORY_CARE_PROVIDER_SITE_OTHER): Payer: Medicare Other | Admitting: *Deleted

## 2016-07-09 DIAGNOSIS — I6350 Cerebral infarction due to unspecified occlusion or stenosis of unspecified cerebral artery: Secondary | ICD-10-CM | POA: Diagnosis not present

## 2016-07-09 DIAGNOSIS — I4891 Unspecified atrial fibrillation: Secondary | ICD-10-CM | POA: Diagnosis not present

## 2016-07-09 LAB — POCT INR: INR: 3.3

## 2016-07-14 ENCOUNTER — Other Ambulatory Visit: Payer: Self-pay | Admitting: Neurology

## 2016-07-23 ENCOUNTER — Ambulatory Visit (INDEPENDENT_AMBULATORY_CARE_PROVIDER_SITE_OTHER): Payer: Medicare Other | Admitting: *Deleted

## 2016-07-23 DIAGNOSIS — I4891 Unspecified atrial fibrillation: Secondary | ICD-10-CM | POA: Diagnosis not present

## 2016-07-23 DIAGNOSIS — I6350 Cerebral infarction due to unspecified occlusion or stenosis of unspecified cerebral artery: Secondary | ICD-10-CM | POA: Diagnosis not present

## 2016-07-23 LAB — POCT INR: INR: 3

## 2016-07-31 ENCOUNTER — Emergency Department (HOSPITAL_COMMUNITY)
Admission: EM | Admit: 2016-07-31 | Discharge: 2016-07-31 | Disposition: A | Discharge status: Home / Self Care | Payer: Medicare Other | Attending: Emergency Medicine | Admitting: Emergency Medicine

## 2016-07-31 ENCOUNTER — Emergency Department (HOSPITAL_COMMUNITY): Payer: Medicare Other

## 2016-07-31 ENCOUNTER — Encounter (HOSPITAL_COMMUNITY): Payer: Self-pay

## 2016-07-31 DIAGNOSIS — R531 Weakness: Secondary | ICD-10-CM | POA: Diagnosis not present

## 2016-07-31 DIAGNOSIS — Z8673 Personal history of transient ischemic attack (TIA), and cerebral infarction without residual deficits: Secondary | ICD-10-CM | POA: Diagnosis not present

## 2016-07-31 DIAGNOSIS — R0602 Shortness of breath: Secondary | ICD-10-CM | POA: Diagnosis not present

## 2016-07-31 DIAGNOSIS — J449 Chronic obstructive pulmonary disease, unspecified: Secondary | ICD-10-CM | POA: Insufficient documentation

## 2016-07-31 DIAGNOSIS — N189 Chronic kidney disease, unspecified: Secondary | ICD-10-CM | POA: Insufficient documentation

## 2016-07-31 DIAGNOSIS — R05 Cough: Secondary | ICD-10-CM | POA: Diagnosis present

## 2016-07-31 DIAGNOSIS — I130 Hypertensive heart and chronic kidney disease with heart failure and stage 1 through stage 4 chronic kidney disease, or unspecified chronic kidney disease: Secondary | ICD-10-CM | POA: Diagnosis not present

## 2016-07-31 DIAGNOSIS — Z91040 Latex allergy status: Secondary | ICD-10-CM | POA: Insufficient documentation

## 2016-07-31 DIAGNOSIS — Z85828 Personal history of other malignant neoplasm of skin: Secondary | ICD-10-CM | POA: Diagnosis not present

## 2016-07-31 DIAGNOSIS — Z7901 Long term (current) use of anticoagulants: Secondary | ICD-10-CM | POA: Insufficient documentation

## 2016-07-31 DIAGNOSIS — I5032 Chronic diastolic (congestive) heart failure: Secondary | ICD-10-CM | POA: Diagnosis not present

## 2016-07-31 DIAGNOSIS — R059 Cough, unspecified: Secondary | ICD-10-CM

## 2016-07-31 DIAGNOSIS — I2510 Atherosclerotic heart disease of native coronary artery without angina pectoris: Secondary | ICD-10-CM | POA: Insufficient documentation

## 2016-07-31 DIAGNOSIS — Z7902 Long term (current) use of antithrombotics/antiplatelets: Secondary | ICD-10-CM | POA: Insufficient documentation

## 2016-07-31 DIAGNOSIS — Z87891 Personal history of nicotine dependence: Secondary | ICD-10-CM | POA: Diagnosis not present

## 2016-07-31 DIAGNOSIS — Z951 Presence of aortocoronary bypass graft: Secondary | ICD-10-CM | POA: Insufficient documentation

## 2016-07-31 DIAGNOSIS — Z79899 Other long term (current) drug therapy: Secondary | ICD-10-CM | POA: Diagnosis not present

## 2016-07-31 DIAGNOSIS — E039 Hypothyroidism, unspecified: Secondary | ICD-10-CM | POA: Diagnosis not present

## 2016-07-31 LAB — BASIC METABOLIC PANEL
Anion gap: 12 (ref 5–15)
BUN: 19 mg/dL (ref 6–20)
CHLORIDE: 99 mmol/L — AB (ref 101–111)
CO2: 27 mmol/L (ref 22–32)
Calcium: 9.4 mg/dL (ref 8.9–10.3)
Creatinine, Ser: 1.34 mg/dL — ABNORMAL HIGH (ref 0.44–1.00)
GFR calc Af Amer: 42 mL/min — ABNORMAL LOW (ref >60)
GFR calc non Af Amer: 37 mL/min — ABNORMAL LOW (ref >60)
Glucose, Bld: 124 mg/dL — ABNORMAL HIGH (ref 65–99)
POTASSIUM: 3.9 mmol/L (ref 3.5–5.1)
SODIUM: 138 mmol/L (ref 135–145)

## 2016-07-31 LAB — URINALYSIS, ROUTINE W REFLEX MICROSCOPIC
Bilirubin Urine: NEGATIVE
Glucose, UA: NEGATIVE mg/dL
KETONES UR: NEGATIVE mg/dL
Nitrite: NEGATIVE
PH: 5 (ref 5.0–8.0)
Protein, ur: NEGATIVE mg/dL
Specific Gravity, Urine: 1.012 (ref 1.005–1.030)

## 2016-07-31 LAB — CBC
HEMATOCRIT: 43.5 % (ref 36.0–46.0)
Hemoglobin: 15 g/dL (ref 12.0–15.0)
MCH: 30.5 pg (ref 26.0–34.0)
MCHC: 34.5 g/dL (ref 30.0–36.0)
MCV: 88.4 fL (ref 78.0–100.0)
Platelets: 234 10*3/uL (ref 150–400)
RBC: 4.92 MIL/uL (ref 3.87–5.11)
RDW: 13.9 % (ref 11.5–15.5)
WBC: 9.5 10*3/uL (ref 4.0–10.5)

## 2016-07-31 LAB — CBG MONITORING, ED: Glucose-Capillary: 123 mg/dL — ABNORMAL HIGH (ref 65–99)

## 2016-07-31 LAB — PROTIME-INR
INR: 2.29
PROTHROMBIN TIME: 25.6 s — AB (ref 11.4–15.2)

## 2016-07-31 LAB — BRAIN NATRIURETIC PEPTIDE: B Natriuretic Peptide: 213.2 pg/mL — ABNORMAL HIGH (ref 0.0–100.0)

## 2016-07-31 MED ORDER — IPRATROPIUM-ALBUTEROL 0.5-2.5 (3) MG/3ML IN SOLN
3.0000 mL | Freq: Once | RESPIRATORY_TRACT | Status: AC
  Administered 2016-07-31: 3 mL via RESPIRATORY_TRACT
  Filled 2016-07-31: qty 3

## 2016-07-31 MED ORDER — METHYLPREDNISOLONE SODIUM SUCC 125 MG IJ SOLR
80.0000 mg | Freq: Once | INTRAMUSCULAR | Status: AC
  Administered 2016-07-31: 80 mg via INTRAVENOUS
  Filled 2016-07-31: qty 2

## 2016-07-31 MED ORDER — SULFAMETHOXAZOLE-TRIMETHOPRIM 800-160 MG PO TABS: 1.0000 | ORAL_TABLET | Freq: Two times a day (BID) | ORAL | 0 refills | Status: AC

## 2016-07-31 MED ORDER — ALBUTEROL SULFATE HFA 108 (90 BASE) MCG/ACT IN AERS
2.0000 | INHALATION_SPRAY | Freq: Once | RESPIRATORY_TRACT | Status: AC
  Administered 2016-07-31: 2 via RESPIRATORY_TRACT
  Filled 2016-07-31: qty 6.7

## 2016-07-31 MED ORDER — PREDNISONE 10 MG PO TABS: 40.0000 mg | ORAL_TABLET | Freq: Every day | ORAL | 0 refills | Status: DC

## 2016-07-31 MED ORDER — ALBUTEROL SULFATE HFA 108 (90 BASE) MCG/ACT IN AERS: 2.0000 | INHALATION_SPRAY | RESPIRATORY_TRACT | 0 refills | Status: DC | PRN

## 2016-07-31 MED ORDER — SODIUM CHLORIDE 0.9 % IV BOLUS (SEPSIS)
250.0000 mL | Freq: Once | INTRAVENOUS | Status: AC
  Administered 2016-07-31: 250 mL via INTRAVENOUS

## 2016-07-31 MED ORDER — SULFAMETHOXAZOLE-TRIMETHOPRIM 800-160 MG PO TABS: 1.0000 | ORAL_TABLET | Freq: Two times a day (BID) | ORAL | 0 refills | Status: DC

## 2016-07-31 NOTE — ED Triage Notes (Signed)
Patient's family reports that she began having a non productive cough and increased confusion x 5 days.

## 2016-07-31 NOTE — ED Notes (Signed)
Pt is alert and verbally responsive. Pt daughters are at bedside, pt denies pain at this time. Pt attempted to ambulated to bathroom to provide urine however pt is very weak and is dependent for 2 person assist for transfer from w/c to commode. Pt was unable to provide urine.

## 2016-07-31 NOTE — Discharge Instructions (Signed)
You seem to be having a possible virus causing a COPD exacerbation. You will need to follow up with your primary care provider as soon as possible on this matter. Please be sure to stay well hydrated and eat small, nutritious meals throughout the day. You are limited in medication options due to your current medication list and your allergy list. You may need a repeat xray next week. Return to the ED for worsening symptoms.   Please take all of your antibiotics until finished!   You may develop abdominal discomfort or diarrhea from the antibiotic.  You may help offset this with probiotics which you can buy or get in yogurt. Do not eat or take the probiotics until 2 hours after your antibiotic.

## 2016-07-31 NOTE — ED Notes (Signed)
Pt. Documented in error CT Head Wo Contrast.

## 2016-07-31 NOTE — ED Provider Notes (Signed)
Amber [name]   CSN: [id number] Arrival date & time: 07/31/16  1526     History   Chief Complaint Chief Complaint  Patient presents with  . Cough  . Altered Mental Status  . Weakness    HPI Amber [name] is a 82 y.o. female.  HPI   Amber [name] is a 82 y.o. female, with a history of dementia, CHF, COPD, CVA with left sided weakness, presenting to the ED with 4 days of worsening coughing. Also endorses somnolence and generalized weakness. Cough nonproductive with mild SOB. Positive sick contacts with similar symptoms. Has not tried anything for it. Decreased oral intake, especially last 24 hours. Orthopnea at baseline. Patient walks with walker and sometimes one-person assist at baseline. Denies congestion, fever, CP, abdominal pain, hematemesis/hemoptysis, or any other complaints.   Past Medical History:  Diagnosis Date  . Abnormality of gait 08/02/2014  . Atrial fibrillation ([place])    a. chronic/rate controlled;  b. chronic coumadin.  [name] Cerebrovascular disease 05/21/2016  . Cervical spinal stenosis   . Chronic diastolic CHF (congestive heart failure) ([place])    a. 02/2013 Echo: EF 55-60%, mild AI, mod dil LA.  [name] COPD with emphysema ([place])    PFT 05/02/10>>FEV1 1.35(62%), FEV1% 66, DLCO 75%  . Coronary artery disease    a. s/p CABG;  b. 04/2010 Neg MV  . CVA (cerebral vascular accident) ([place])    left sided weakness  . Dementia   . Depression   . Fibromyalgia   . GERD (gastroesophageal reflux disease)    pepcid 2-3 times per week  . Gout   . Hemiparesis and alteration of sensations as late effects of stroke ([place]) 01/17/2015  . History of melanoma    squamous cell, melanoma  . Hyperlipidemia   . Hypertension   . Hypothyroidism   . Insomnia   . Intermittent confusion   . Memory change 01/23/2014  . Nasal polyposis   . Osteoarthritis   . Osteoporosis   . Pneumonia    1990    Patient Active Problem List   Diagnosis Date Noted  .  Cerebrovascular disease 05/21/2016  . Hemiparesis and alteration of sensations as late effects of stroke ([place]) 01/17/2015  . Memory change 01/23/2014  . Delirium due to multiple etiologies 02/14/2013  . 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 Long-term (current) use of anticoagulants   . Streptococcal pneumonia ([place]) 02/12/2013  . Vertebral artery stenosis 12/18/2011  . COPD (chronic obstructive pulmonary disease) ([place]) 06/04/2010  . Hypertensive heart and chronic kidney disease with heart failure and stage 1 through stage 4 chronic kidney disease, or chronic kidney disease ([place]) 05/29/2010  . Fibromyalgia 05/10/2010  . Depression 05/10/2010  . Cervical spinal stenosis 05/10/2010  . Hypothyroidism 05/10/2010  . Osteoporosis 05/10/2010  . Coronary artery disease involving coronary bypass graft of native heart with angina pectoris ([place]) 05/10/2010  . Chronic diastolic heart failure ([place])   . Dyslipidemia   . Atrial fibrillation ([place]) 05/06/2010  . Unspecified cerebral artery occlusion with cerebral infarction 05/06/2010    Past Surgical History:  Procedure Laterality Date  . ABDOMINAL HYSTERECTOMY    . BREAST LUMPECTOMY  1980s   Benign lesion - right  . carpel tunnel     right  . CORONARY ARTERY BYPASS GRAFT  2000  . EYE SURGERY     bilateral cataracts  . IR GENERIC HISTORICAL  03/31/2016   IR RADIOLOGIST EVAL & MGMT 03/31/2016 [facility]INTER[facility] RAD  . RADIOLOGY WITH ANESTHESIA  12/24/2011  Procedure: RADIOLOGY WITH ANESTHESIA;  Surgeon: Medication Radiologist, MD;  Location: [facility];  Service: Radiology;  Laterality: N/A;  Extra Cranial Vascular Stent  . RADIOLOGY WITH ANESTHESIA N/A 07/19/2014   Procedure: ANGIOPLASTY;  Surgeon: Luanne Bras, MD;  Location: [facility];  Service: Radiology;  Laterality: N/A;  . RADIOLOGY WITH ANESTHESIA N/A 07/27/2014   Procedure: ANGIOPLASTY;  Surgeon: Luanne Bras, MD;  Location: [facility];  Service: Radiology;  Laterality: N/A;  . TONSILLECTOMY       OB History    No data available       Home Medications    Prior to Admission medications   Medication Sig Start Date End Date Taking? Authorizing Provider  allopurinol (ZYLOPRIM) 300 MG tablet Take 1 tablet (300 mg total) by mouth every other day. Patient taking differently: Take 300 mg by mouth daily.  03/06/16  Yes Burtis Junes, NP  amitriptyline (ELAVIL) 10 MG tablet Take 2 tablets (20 mg total) by mouth at bedtime. 09/07/15  Yes Kathrynn Ducking, MD  amLODipine (NORVASC) 5 MG tablet TAKE 1 TABLET BY MOUTH AT BEDTIME 04/25/16  Yes Belva Crome, MD  celecoxib (CELEBREX) 200 MG capsule Take 200 mg by mouth every morning.    Yes [provider]  citalopram (CELEXA) 20 MG tablet TAKE 1 TABLET BY MOUTH TWICE A DAY 11/09/14  Yes Darlin Coco, MD  clopidogrel (PLAVIX) 75 MG tablet TAKE 1 TABLET (75 MG TOTAL) BY MOUTH DAILY. 02/21/16  Yes Belva Crome, MD  COUMADIN 5 MG tablet TAKE AS DIRECTED BY COUMADIN CLINIC 05/12/16  Yes Belva Crome, MD  digoxin Fonnie Birkenhead) 0.125 MG tablet Take 1 tablet (0.125 mg total) by mouth on Mondays, Wednesdays, & Fridays 03/27/16  Yes Belva Crome, MD  donepezil (ARICEPT) 10 MG tablet Take 1 tablet (10 mg total) by mouth at bedtime. Patient taking differently: Take 10 mg by mouth daily.  06/13/16  Yes Kathrynn Ducking, MD  ergocalciferol (VITAMIN D2) 50000 UNITS capsule Take 50,000 Units by mouth 2 (two) times a week. Tuesday and Friday   Yes [provider]  furosemide (LASIX) 80 MG tablet Take 1 tablet (80 mg total) by mouth daily. In the AM 10/09/15 07/31/16 Yes Belva Crome, MD  KLOR-CON M20 20 MEQ tablet Take 20 mEq by mouth 2 (two) times daily.  03/11/16  Yes [provider]  levothyroxine (SYNTHROID, LEVOTHROID) 25 MCG tablet Take 25 mcg by mouth every morning.    Yes [provider]  memantine (NAMENDA XR) 28 MG CP24 24 hr capsule TAKE ONE CAPSULE BY MOUTH EVERY DAY 07/15/16  Yes Kathrynn Ducking, MD   metoprolol succinate (TOPROL-XL) 25 MG 24 hr tablet TAKE 1 TABLET (25 MG TOTAL) BY MOUTH DAILY. Patient taking differently: TAKE 1 TABLET (25 MG TOTAL) BY MOUTH DAILY. Morning 05/16/16  Yes Belva Crome, MD  PROAIR HFA 108 (90 BASE) MCG/ACT inhaler INHALE 2 PUFFS INTO LUNGS EVERY 6 HOURS AS NEEDED FOR WHEEZING 12/22/13  Yes Darlin Coco, MD  valsartan (DIOVAN) 40 MG tablet TAKE 1 TABLET (40 MG TOTAL) BY MOUTH 2 (TWO) TIMES DAILY. 03/27/16  Yes Burtis Junes, NP  albuterol (PROVENTIL HFA;VENTOLIN HFA) 108 (90 Base) MCG/ACT inhaler Inhale 2 puffs into the lungs every 4 (four) hours as needed for wheezing or shortness of breath. 07/31/16   Jaedon Siler C, PA-C  nitroGLYCERIN (NITROSTAT) 0.4 MG SL tablet Place 1 tablet (0.4 mg total) under the tongue every 5 (five) minutes as needed  for chest pain. 09/13/12   Darlin Coco, MD  sulfamethoxazole-trimethoprim (BACTRIM DS,SEPTRA DS) 800-160 MG tablet Take 1 tablet by mouth 2 (two) times daily. 07/31/16 08/07/16  Lorayne Bender, PA-C    Family History Family History  Problem Relation Age of Onset  . Stroke Mother   . CAD Father   . Diabetes type II Brother   . CAD Brother     Social History Social History  Substance Use Topics  . Smoking status: Former Smoker    Packs/day: 1.50    Years: 25.00    Types: Cigarettes    Quit date: 02/10/1977  . Smokeless tobacco: Never Used  . Alcohol use No     Allergies   Ezetimibe; Welchol [colesevelam hcl]; Adhesive [tape]; Ceclor [cefaclor]; Elastic bandages & [zinc]; Latex; Lipitor [atorvastatin calcium]; Mevacor [lovastatin]; Pravachol; and Vasotec   Review of Systems Review of Systems  Constitutional: Positive for activity change and appetite change. Negative for fever.  Respiratory: Positive for cough and shortness of breath.   Cardiovascular: Negative for chest pain.  Gastrointestinal: Negative for abdominal pain, diarrhea, nausea and vomiting.  Neurological: Positive for weakness  (generalized). Negative for dizziness, syncope, speech difficulty, numbness and headaches.  All other systems reviewed and are negative.    Physical Exam Updated Vital Signs BP 124/61   Pulse 66   Temp 98.5 F (36.9 C) (Oral)   Resp 16   Ht 5\' 4"  (1.626 m)   Wt 68 kg (150 lb)   SpO2 95%   BMI 25.75 kg/m   Physical Exam  Constitutional: She appears well-developed and well-nourished. No distress.  Sleeping, but easily arousible.  HENT:  Head: Normocephalic and atraumatic.  Eyes: Conjunctivae are normal.  Neck: Neck supple.  Cardiovascular: Normal rate, regular rhythm, normal heart sounds and intact distal pulses.   Pulmonary/Chest: Effort normal and breath sounds normal. No respiratory distress.  No increased work of breathing. Speaks in full sentences without difficulty. Patient begins to show some increased work of breathing with sitting up in bed and moving around.  Abdominal: Soft. There is no tenderness. There is no guarding.  Musculoskeletal: She exhibits no edema.  Lymphadenopathy:    She has no cervical adenopathy.  Neurological: She is alert.  Left-sided upper and lower extremity weakness at baseline. 5 out of 5 strength on the right upper and lower extremity. Patient is oriented to person, place, time, and situation. No facial droop. No noted sensory deficits. Coordination intact.  Skin: Skin is warm and dry. She is not diaphoretic.  Psychiatric: She has a normal mood and affect. Her behavior is normal.  Nursing [name] and vitals reviewed.    ED Treatments / Results  Labs (all labs ordered are listed, but only abnormal results are displayed) Labs Reviewed  BASIC METABOLIC PANEL - Abnormal; Notable for the following:       Result Value   Chloride 99 (*)    Glucose, Bld 124 (*)    Creatinine, Ser 1.34 (*)    GFR calc non Af Amer 37 (*)    GFR calc Af Amer 42 (*)    All other components within normal limits  URINALYSIS, ROUTINE W REFLEX MICROSCOPIC - Abnormal;  Notable for the following:    APPearance HAZY (*)    Hgb urine dipstick SMALL (*)    Leukocytes, UA LARGE (*)    Bacteria, UA RARE (*)    Squamous Epithelial / LPF 0-5 (*)    Non Squamous Epithelial 0-5 (*)  All other components within normal limits  BRAIN NATRIURETIC PEPTIDE - Abnormal; Notable for the following:    B Natriuretic Peptide 213.2 (*)    All other components within normal limits  PROTIME-INR - Abnormal; Notable for the following:    Prothrombin Time 25.6 (*)    All other components within normal limits  CBG MONITORING, ED - Abnormal; Notable for the following:    Glucose-Capillary 123 (*)    All other components within normal limits  URINE CULTURE  CBC    EKG  EKG Interpretation  Date/Time:  Thursday July 31 2016 16:08:48 EDT Ventricular Rate:  68 PR Interval:    QRS Duration: 78 QT Interval:  381 QTC Calculation: 406 R Axis:   67 Text Interpretation:  Atrial fibrillation Repol abnrm suggests ischemia, diffuse leads More exaggerated TW changes in comparison to prior ECG Confirmed by Gareth Morgan [phone]) on 07/31/2016 6:09:01 PM       Radiology Dg Chest 2 View  Result Date: 07/31/2016 CLINICAL DATA:  Cough for 5 days. EXAM: CHEST  2 VIEW COMPARISON:  September 12, 2015 FINDINGS: The heart size and mediastinal contours are stable. Patient status post prior CABG and median sternotomy. Both lungs are clear. The lungs are hyperinflated. Compression deformity of the lower thoracic/upper lumbar vertebral bodies are noted. IMPRESSION: No active cardiopulmonary disease. Electronically Signed   By: Abelardo Diesel M.D.   On: 07/31/2016 18:32   Ct Head Wo Contrast  Result Date: 07/31/2016 CLINICAL DATA:  Altered mental status, increased confusion over 5 days, fell last week, on Coumadin, history coronary artery disease post CABG, hypertension, hyperlipidemia, COPD, chronic diastolic CHF, atrial fibrillation, stroke, dementia EXAM: CT HEAD WITHOUT CONTRAST CT CERVICAL  SPINE WITHOUT CONTRAST TECHNIQUE: Multidetector CT imaging of the head and cervical spine was performed following the standard protocol without intravenous contrast. Multiplanar CT image reconstructions of the cervical spine were also generated. COMPARISON:  CT head 04/08/2016 FINDINGS: CT HEAD FINDINGS Brain: Generalized atrophy. Ex vacuo dilatation of the ventricular system similar to prior exam. No midline shift or mass effect. Minimal small vessel chronic ischemic changes of deep cerebral white matter. No intracranial hemorrhage, mass lesion, or evidence acute infarction. No extra-axial fluid collections. Vascular: Atherosclerotic calcifications of the internal carotid arteries at the skullbase. Skull: Osseous demineralization.  Intact. Sinuses/Orbits: Clear Other: N/A CT CERVICAL SPINE FINDINGS Alignment: Normal Skull base and vertebrae: Diffuse osseous demineralization. Vertebral body heights maintained without fracture or bone destruction. Visualized skullbase intact. Scattered facet degenerative changes. Significant degenerative changes at the C1-C2 articulations greater on LEFT. Soft tissues and spinal canal: Prevertebral soft tissues normal thickness. Atherosclerotic calcifications of the carotid bifurcations bilaterally, which extend retropharyngeal. No additional soft tissue abnormalities. Disc levels:  Multilevel disc space narrowing. Upper chest: Biapical lung scarring greater on RIGHT. Other: N/A IMPRESSION: Atrophy with minimal small vessel chronic ischemic changes of deep cerebral white matter. No acute intracranial abnormalities. Osseous demineralization with multilevel degenerative disc and facet disease changes of the cervical spine. No acute cervical spine abnormalities. Atherosclerotic calcifications throughout the carotid systems as above. Electronically Signed   By: Lavonia Dana M.D.   On: 07/31/2016 20:17   Ct Cervical Spine Wo Contrast  Result Date: 07/31/2016 CLINICAL DATA:  Altered  mental status, increased confusion over 5 days, fell last week, on Coumadin, history coronary artery disease post CABG, hypertension, hyperlipidemia, COPD, chronic diastolic CHF, atrial fibrillation, stroke, dementia EXAM: CT HEAD WITHOUT CONTRAST CT CERVICAL SPINE WITHOUT CONTRAST TECHNIQUE: Multidetector CT imaging of the head  and cervical spine was performed following the standard protocol without intravenous contrast. Multiplanar CT image reconstructions of the cervical spine were also generated. COMPARISON:  CT head 04/08/2016 FINDINGS: CT HEAD FINDINGS Brain: Generalized atrophy. Ex vacuo dilatation of the ventricular system similar to prior exam. No midline shift or mass effect. Minimal small vessel chronic ischemic changes of deep cerebral white matter. No intracranial hemorrhage, mass lesion, or evidence acute infarction. No extra-axial fluid collections. Vascular: Atherosclerotic calcifications of the internal carotid arteries at the skullbase. Skull: Osseous demineralization.  Intact. Sinuses/Orbits: Clear Other: N/A CT CERVICAL SPINE FINDINGS Alignment: Normal Skull base and vertebrae: Diffuse osseous demineralization. Vertebral body heights maintained without fracture or bone destruction. Visualized skullbase intact. Scattered facet degenerative changes. Significant degenerative changes at the C1-C2 articulations greater on LEFT. Soft tissues and spinal canal: Prevertebral soft tissues normal thickness. Atherosclerotic calcifications of the carotid bifurcations bilaterally, which extend retropharyngeal. No additional soft tissue abnormalities. Disc levels:  Multilevel disc space narrowing. Upper chest: Biapical lung scarring greater on RIGHT. Other: N/A IMPRESSION: Atrophy with minimal small vessel chronic ischemic changes of deep cerebral white matter. No acute intracranial abnormalities. Osseous demineralization with multilevel degenerative disc and facet disease changes of the cervical spine. No  acute cervical spine abnormalities. Atherosclerotic calcifications throughout the carotid systems as above. Electronically Signed   By: Lavonia Dana M.D.   On: 07/31/2016 20:17    Procedures Procedures (including critical care time)  Medications Ordered in ED Medications  ipratropium-albuterol (DUONEB) 0.5-2.5 (3) MG/3ML nebulizer solution 3 mL (3 mLs Nebulization Given 07/31/16 1834)  sodium chloride 0.9 % bolus 250 mL (0 mLs Intravenous Stopped 07/31/16 2153)  methylPREDNISolone sodium succinate (SOLU-MEDROL) 125 mg/2 mL injection 80 mg (80 mg Intravenous Given 07/31/16 2008)  albuterol (PROVENTIL HFA;VENTOLIN HFA) 108 (90 Base) MCG/ACT inhaler 2 puff (2 puffs Inhalation Given 07/31/16 2301)     Initial Impression / Assessment and Plan / ED Course  I have reviewed the triage vital signs and the nursing notes.  Pertinent labs & imaging results that were available during my care of the patient were reviewed by me and considered in my medical decision making (see chart for details).  Clinical Course as of Aug 01 137  Thu Jul 31, 2016  1920 Patient voices improvement in strength and breathing following DuoNeb.  [SJ]  2042 Patient continues to voice improvement in breathing, but continues to be generally weak.  [SJ]  2237 Patient walked with the amount of assistance she would normally have at home. She maintained SPO2 of 94-95% on room air. No signs of shortness of breath. Denies difficulty breathing or chest pain.   [SJ]    Clinical Course User Index [SJ] Carlie Solorzano C, PA-C    Patient presents with complaint of generalized weakness and a nonproductive cough. After patient received DuoNeb treatment, she voiced a significant improvement in breathing and increase in appetite. She was given something to eat and water to drink. After this she felt stronger. She still required assistance to move around, but stated she felt closer to her baseline. Patient's daughters at the bedside agreed.  Questionable UTI on UA may be contributing to patient's symptoms. Do not suspect sepsis. ABX initiated. Choices limited by allergy list and possible interactions with daily medications. Patient and her daughters voice understanding they will have to have patient's INR rechecked next week while taking the coumadin and Bactrim together. Shared decision making was used for weighing the pros and cons of discharge versus admission. Patient and patient's  daughters opted for discharge and close observation at home. Close PCP follow-up. The patient was given instructions for home care as well as strict return precautions. Patient voices understanding of these instructions, accepts the plan, and is comfortable with discharge.    Findings and plan of care discussed with Gareth Morgan, MD. Dr. Billy Fischer personally evaluated and examined this patient.  Vitals:   07/31/16 1812 07/31/16 2005 07/31/16 2251 07/31/16 2255  BP: (!) 141/71 112/68 (!) 145/56 (!) 145/56  Pulse: 62 77 69 69  Resp: 19 18 18 18   Temp:  98.1 F (36.7 C) 97.7 F (36.5 C) 97.7 F (36.5 C)  TempSrc:  Oral Oral Oral  SpO2: 94% 97% 94% 94%  Weight:      Height:         Final Clinical Impressions(s) / ED Diagnoses   Final diagnoses:  Cough    New Prescriptions Discharge Medication List as of 07/31/2016 10:50 PM    START taking these medications   Details  !! albuterol (PROVENTIL HFA;VENTOLIN HFA) 108 (90 Base) MCG/ACT inhaler Inhale 2 puffs into the lungs every 4 (four) hours as needed for wheezing or shortness of breath., Starting Thu 07/31/2016, Print     !! - Potential duplicate medications found. Please discuss with provider.       Lorayne Bender, PA-C 08/01/16 0140    Gareth Morgan, MD 08/01/16 1427

## 2016-07-31 NOTE — ED Notes (Signed)
Patient knows of the need for urine but not able to go at this time.

## 2016-08-02 LAB — URINE CULTURE: CULTURE: NO GROWTH

## 2016-08-05 ENCOUNTER — Ambulatory Visit (INDEPENDENT_AMBULATORY_CARE_PROVIDER_SITE_OTHER): Payer: Medicare Other | Admitting: *Deleted

## 2016-08-05 DIAGNOSIS — I6350 Cerebral infarction due to unspecified occlusion or stenosis of unspecified cerebral artery: Secondary | ICD-10-CM | POA: Diagnosis not present

## 2016-08-05 DIAGNOSIS — I4891 Unspecified atrial fibrillation: Secondary | ICD-10-CM

## 2016-08-05 DIAGNOSIS — I482 Chronic atrial fibrillation, unspecified: Secondary | ICD-10-CM

## 2016-08-05 LAB — POCT INR: INR: 2.7

## 2016-08-31 ENCOUNTER — Other Ambulatory Visit: Payer: Self-pay | Admitting: Neurology

## 2016-08-31 ENCOUNTER — Other Ambulatory Visit: Payer: Self-pay | Admitting: Interventional Cardiology

## 2016-09-05 ENCOUNTER — Telehealth: Payer: Self-pay | Admitting: Interventional Cardiology

## 2016-09-05 NOTE — Telephone Encounter (Signed)
Will route to medical records 

## 2016-09-05 NOTE — Telephone Encounter (Signed)
New Message:   He would like her last her last EF,no matter how long ago it was.If he is not there please leave it on his voicemail please.

## 2016-09-08 ENCOUNTER — Ambulatory Visit (INDEPENDENT_AMBULATORY_CARE_PROVIDER_SITE_OTHER): Payer: Medicare Other

## 2016-09-08 ENCOUNTER — Ambulatory Visit: Payer: Medicare Other | Admitting: Neurology

## 2016-09-08 ENCOUNTER — Telehealth: Payer: Self-pay | Admitting: Neurology

## 2016-09-08 DIAGNOSIS — I4891 Unspecified atrial fibrillation: Secondary | ICD-10-CM

## 2016-09-08 DIAGNOSIS — I6350 Cerebral infarction due to unspecified occlusion or stenosis of unspecified cerebral artery: Secondary | ICD-10-CM | POA: Diagnosis not present

## 2016-09-08 LAB — POCT INR: INR: 2.4

## 2016-09-08 NOTE — Telephone Encounter (Signed)
This patient did not show for a revisit appointment today. 

## 2016-09-09 ENCOUNTER — Encounter: Payer: Self-pay | Admitting: Neurology

## 2016-09-22 ENCOUNTER — Other Ambulatory Visit: Payer: Self-pay | Admitting: Neurology

## 2016-09-25 ENCOUNTER — Other Ambulatory Visit: Payer: Self-pay | Admitting: Pharmacist

## 2016-09-25 NOTE — Telephone Encounter (Signed)
Request from pharmacy to change valsartan due to backorder/recall.  Will change to losartan 25mg  BID due to CHF. (Was on valsartan 40mg  BID).

## 2016-09-26 MED ORDER — LOSARTAN POTASSIUM 25 MG PO TABS: 25.0000 mg | ORAL_TABLET | Freq: Two times a day (BID) | ORAL | 3 refills | Status: DC

## 2016-09-26 NOTE — Telephone Encounter (Signed)
Spoke with patient and advised of change. Advised to monitor pressures and call with changes/concerns. She states understanding and appreciation.

## 2016-10-06 ENCOUNTER — Ambulatory Visit (INDEPENDENT_AMBULATORY_CARE_PROVIDER_SITE_OTHER): Payer: Medicare Other | Admitting: *Deleted

## 2016-10-06 DIAGNOSIS — I6350 Cerebral infarction due to unspecified occlusion or stenosis of unspecified cerebral artery: Secondary | ICD-10-CM | POA: Diagnosis not present

## 2016-10-06 DIAGNOSIS — I4891 Unspecified atrial fibrillation: Secondary | ICD-10-CM

## 2016-10-06 LAB — POCT INR: INR: 3.2

## 2016-10-20 ENCOUNTER — Ambulatory Visit (INDEPENDENT_AMBULATORY_CARE_PROVIDER_SITE_OTHER): Payer: Medicare Other | Admitting: *Deleted

## 2016-10-20 DIAGNOSIS — I4891 Unspecified atrial fibrillation: Secondary | ICD-10-CM | POA: Diagnosis not present

## 2016-10-20 DIAGNOSIS — I6350 Cerebral infarction due to unspecified occlusion or stenosis of unspecified cerebral artery: Secondary | ICD-10-CM | POA: Diagnosis not present

## 2016-10-20 DIAGNOSIS — Z5181 Encounter for therapeutic drug level monitoring: Secondary | ICD-10-CM | POA: Diagnosis not present

## 2016-10-20 DIAGNOSIS — I482 Chronic atrial fibrillation, unspecified: Secondary | ICD-10-CM

## 2016-10-20 LAB — POCT INR: INR: 2.1

## 2016-10-23 ENCOUNTER — Ambulatory Visit: Payer: Medicare Other | Admitting: Neurology

## 2016-11-13 ENCOUNTER — Ambulatory Visit (INDEPENDENT_AMBULATORY_CARE_PROVIDER_SITE_OTHER): Payer: Medicare Other | Admitting: *Deleted

## 2016-11-13 DIAGNOSIS — I4891 Unspecified atrial fibrillation: Secondary | ICD-10-CM | POA: Diagnosis not present

## 2016-11-13 DIAGNOSIS — Z5181 Encounter for therapeutic drug level monitoring: Secondary | ICD-10-CM

## 2016-11-13 DIAGNOSIS — I6350 Cerebral infarction due to unspecified occlusion or stenosis of unspecified cerebral artery: Secondary | ICD-10-CM

## 2016-11-13 LAB — POCT INR: INR: 2.2

## 2016-11-17 ENCOUNTER — Telehealth: Payer: Self-pay | Admitting: *Deleted

## 2016-11-17 NOTE — Telephone Encounter (Signed)
Pt's dtr called to inform us that the pt was started on Augmentin today. Advised that the medication does not interfere with Coumadin & safe to take with Coumadin & she verbalized understanding. Advised to call back with any other new medications, too.

## 2016-12-11 ENCOUNTER — Ambulatory Visit (INDEPENDENT_AMBULATORY_CARE_PROVIDER_SITE_OTHER): Payer: Medicare Other

## 2016-12-11 DIAGNOSIS — I4891 Unspecified atrial fibrillation: Secondary | ICD-10-CM

## 2016-12-11 DIAGNOSIS — Z5181 Encounter for therapeutic drug level monitoring: Secondary | ICD-10-CM | POA: Diagnosis not present

## 2016-12-11 LAB — POCT INR: INR: 2.2

## 2017-01-01 ENCOUNTER — Encounter (HOSPITAL_COMMUNITY): Payer: Self-pay | Admitting: Emergency Medicine

## 2017-01-01 ENCOUNTER — Emergency Department (HOSPITAL_COMMUNITY): Payer: Medicare Other

## 2017-01-01 ENCOUNTER — Observation Stay (HOSPITAL_COMMUNITY)
Admission: EM | Admit: 2017-01-01 | Discharge: 2017-01-03 | Disposition: A | Discharge status: Home / Self Care | Payer: Medicare Other | Attending: Internal Medicine | Admitting: Internal Medicine

## 2017-01-01 DIAGNOSIS — Z8249 Family history of ischemic heart disease and other diseases of the circulatory system: Secondary | ICD-10-CM | POA: Diagnosis not present

## 2017-01-01 DIAGNOSIS — Z87891 Personal history of nicotine dependence: Secondary | ICD-10-CM | POA: Insufficient documentation

## 2017-01-01 DIAGNOSIS — F329 Major depressive disorder, single episode, unspecified: Secondary | ICD-10-CM | POA: Insufficient documentation

## 2017-01-01 DIAGNOSIS — Z7902 Long term (current) use of antithrombotics/antiplatelets: Secondary | ICD-10-CM | POA: Insufficient documentation

## 2017-01-01 DIAGNOSIS — I482 Chronic atrial fibrillation: Secondary | ICD-10-CM | POA: Diagnosis not present

## 2017-01-01 DIAGNOSIS — R413 Other amnesia: Secondary | ICD-10-CM | POA: Diagnosis present

## 2017-01-01 DIAGNOSIS — I700 Atherosclerosis of aorta: Secondary | ICD-10-CM | POA: Diagnosis not present

## 2017-01-01 DIAGNOSIS — I2510 Atherosclerotic heart disease of native coronary artery without angina pectoris: Secondary | ICD-10-CM | POA: Diagnosis not present

## 2017-01-01 DIAGNOSIS — I5042 Chronic combined systolic (congestive) and diastolic (congestive) heart failure: Secondary | ICD-10-CM | POA: Diagnosis present

## 2017-01-01 DIAGNOSIS — Z91040 Latex allergy status: Secondary | ICD-10-CM | POA: Insufficient documentation

## 2017-01-01 DIAGNOSIS — I69354 Hemiplegia and hemiparesis following cerebral infarction affecting left non-dominant side: Secondary | ICD-10-CM | POA: Diagnosis not present

## 2017-01-01 DIAGNOSIS — I5032 Chronic diastolic (congestive) heart failure: Secondary | ICD-10-CM | POA: Insufficient documentation

## 2017-01-01 DIAGNOSIS — M109 Gout, unspecified: Secondary | ICD-10-CM | POA: Diagnosis not present

## 2017-01-01 DIAGNOSIS — J441 Chronic obstructive pulmonary disease with (acute) exacerbation: Secondary | ICD-10-CM

## 2017-01-01 DIAGNOSIS — G4700 Insomnia, unspecified: Secondary | ICD-10-CM | POA: Diagnosis not present

## 2017-01-01 DIAGNOSIS — I5033 Acute on chronic diastolic (congestive) heart failure: Secondary | ICD-10-CM

## 2017-01-01 DIAGNOSIS — J439 Emphysema, unspecified: Secondary | ICD-10-CM | POA: Insufficient documentation

## 2017-01-01 DIAGNOSIS — E039 Hypothyroidism, unspecified: Secondary | ICD-10-CM | POA: Diagnosis not present

## 2017-01-01 DIAGNOSIS — J209 Acute bronchitis, unspecified: Secondary | ICD-10-CM | POA: Diagnosis not present

## 2017-01-01 DIAGNOSIS — J9600 Acute respiratory failure, unspecified whether with hypoxia or hypercapnia: Secondary | ICD-10-CM | POA: Insufficient documentation

## 2017-01-01 DIAGNOSIS — M810 Age-related osteoporosis without current pathological fracture: Secondary | ICD-10-CM | POA: Diagnosis not present

## 2017-01-01 DIAGNOSIS — F0390 Unspecified dementia without behavioral disturbance: Secondary | ICD-10-CM | POA: Diagnosis not present

## 2017-01-01 DIAGNOSIS — I088 Other rheumatic multiple valve diseases: Secondary | ICD-10-CM | POA: Diagnosis not present

## 2017-01-01 DIAGNOSIS — Z79899 Other long term (current) drug therapy: Secondary | ICD-10-CM | POA: Insufficient documentation

## 2017-01-01 DIAGNOSIS — I130 Hypertensive heart and chronic kidney disease with heart failure and stage 1 through stage 4 chronic kidney disease, or unspecified chronic kidney disease: Secondary | ICD-10-CM | POA: Diagnosis not present

## 2017-01-01 DIAGNOSIS — I4821 Permanent atrial fibrillation: Secondary | ICD-10-CM

## 2017-01-01 DIAGNOSIS — Z7901 Long term (current) use of anticoagulants: Secondary | ICD-10-CM | POA: Insufficient documentation

## 2017-01-01 DIAGNOSIS — I4891 Unspecified atrial fibrillation: Secondary | ICD-10-CM | POA: Diagnosis not present

## 2017-01-01 DIAGNOSIS — Z888 Allergy status to other drugs, medicaments and biological substances status: Secondary | ICD-10-CM | POA: Insufficient documentation

## 2017-01-01 DIAGNOSIS — M1990 Unspecified osteoarthritis, unspecified site: Secondary | ICD-10-CM | POA: Insufficient documentation

## 2017-01-01 DIAGNOSIS — Z85820 Personal history of malignant melanoma of skin: Secondary | ICD-10-CM | POA: Insufficient documentation

## 2017-01-01 DIAGNOSIS — K219 Gastro-esophageal reflux disease without esophagitis: Secondary | ICD-10-CM | POA: Insufficient documentation

## 2017-01-01 DIAGNOSIS — E785 Hyperlipidemia, unspecified: Secondary | ICD-10-CM | POA: Diagnosis not present

## 2017-01-01 DIAGNOSIS — M797 Fibromyalgia: Secondary | ICD-10-CM | POA: Insufficient documentation

## 2017-01-01 DIAGNOSIS — J90 Pleural effusion, not elsewhere classified: Secondary | ICD-10-CM

## 2017-01-01 DIAGNOSIS — E876 Hypokalemia: Secondary | ICD-10-CM | POA: Diagnosis not present

## 2017-01-01 LAB — DIGOXIN LEVEL: Digoxin Level: 0.4 ng/mL — ABNORMAL LOW (ref 0.8–2.0)

## 2017-01-01 LAB — PROTIME-INR
INR: 3.56
Prothrombin Time: 35.3 seconds — ABNORMAL HIGH (ref 11.4–15.2)

## 2017-01-01 LAB — BASIC METABOLIC PANEL
Anion gap: 11 (ref 5–15)
BUN: 9 mg/dL (ref 6–20)
CHLORIDE: 100 mmol/L — AB (ref 101–111)
CO2: 26 mmol/L (ref 22–32)
Calcium: 9.3 mg/dL (ref 8.9–10.3)
Creatinine, Ser: 0.88 mg/dL (ref 0.44–1.00)
GFR calc Af Amer: >60 mL/min (ref >60)
GFR calc non Af Amer: >60 mL/min (ref >60)
GLUCOSE: 164 mg/dL — AB (ref 65–99)
POTASSIUM: 3.1 mmol/L — AB (ref 3.5–5.1)
Sodium: 137 mmol/L (ref 135–145)

## 2017-01-01 LAB — CBC
HEMATOCRIT: 44.4 % (ref 36.0–46.0)
Hemoglobin: 14.6 g/dL (ref 12.0–15.0)
MCH: 30 pg (ref 26.0–34.0)
MCHC: 32.9 g/dL (ref 30.0–36.0)
MCV: 91.4 fL (ref 78.0–100.0)
Platelets: 214 10*3/uL (ref 150–400)
RBC: 4.86 MIL/uL (ref 3.87–5.11)
RDW: 14.5 % (ref 11.5–15.5)
WBC: 9 10*3/uL (ref 4.0–10.5)

## 2017-01-01 LAB — HEPATIC FUNCTION PANEL
ALK PHOS: 70 U/L (ref 38–126)
ALT: 15 U/L (ref 14–54)
AST: 29 U/L (ref 15–41)
Albumin: 4.7 g/dL (ref 3.5–5.0)
BILIRUBIN DIRECT: 0.2 mg/dL (ref 0.1–0.5)
BILIRUBIN INDIRECT: 1.3 mg/dL — AB (ref 0.3–0.9)
BILIRUBIN TOTAL: 1.5 mg/dL — AB (ref 0.3–1.2)
TOTAL PROTEIN: 8 g/dL (ref 6.5–8.1)

## 2017-01-01 LAB — DIFFERENTIAL
Basophils Absolute: 0 10*3/uL (ref 0.0–0.1)
Basophils Relative: 0 %
EOS PCT: 1 %
Eosinophils Absolute: 0.1 10*3/uL (ref 0.0–0.7)
LYMPHS ABS: 1.3 10*3/uL (ref 0.7–4.0)
LYMPHS PCT: 14 %
MONOS PCT: 8 %
Monocytes Absolute: 0.7 10*3/uL (ref 0.1–1.0)
NEUTROS PCT: 77 %
Neutro Abs: 6.9 10*3/uL (ref 1.7–7.7)

## 2017-01-01 LAB — I-STAT TROPONIN, ED: Troponin i, poc: 0.01 ng/mL (ref 0.00–0.08)

## 2017-01-01 LAB — BRAIN NATRIURETIC PEPTIDE: B Natriuretic Peptide: 266.5 pg/mL — ABNORMAL HIGH (ref 0.0–100.0)

## 2017-01-01 MED ORDER — METHYLPREDNISOLONE SODIUM SUCC 125 MG IJ SOLR
125.0000 mg | Freq: Once | INTRAMUSCULAR | Status: AC
  Administered 2017-01-01: 125 mg via INTRAVENOUS
  Filled 2017-01-01: qty 2

## 2017-01-01 MED ORDER — AZITHROMYCIN 250 MG PO TABS
500.0000 mg | ORAL_TABLET | Freq: Once | ORAL | Status: AC
  Administered 2017-01-01: 500 mg via ORAL
  Filled 2017-01-01: qty 2

## 2017-01-01 MED ORDER — IPRATROPIUM-ALBUTEROL 0.5-2.5 (3) MG/3ML IN SOLN
3.0000 mL | Freq: Once | RESPIRATORY_TRACT | Status: AC
  Administered 2017-01-01: 3 mL via RESPIRATORY_TRACT
  Filled 2017-01-01: qty 3

## 2017-01-01 MED ORDER — POTASSIUM CHLORIDE CRYS ER 20 MEQ PO TBCR
40.0000 meq | EXTENDED_RELEASE_TABLET | Freq: Once | ORAL | Status: AC
  Administered 2017-01-01: 40 meq via ORAL
  Filled 2017-01-01: qty 2

## 2017-01-01 NOTE — ED Notes (Signed)
Spoke with Tasha from lab stating that BNP was in process.

## 2017-01-01 NOTE — ED Triage Notes (Signed)
Pt brought in by neighbor. Neighbor reports pt has dementia, but oriented to most things.  Pt reports she began to have SOB and CP 2 days ago, that got worse in the last couple hours. No new cough. Does endorse weakness and sleepiness. No dizziness

## 2017-01-01 NOTE — H&P (Addendum)
TRH H&P    Patient Demographics:    Amber [name], is a 82 y.o. female  MRN: [id number]  DOB - 06-13-1934  Admit Date - 01/01/2017  Referring MD/NP/PA: Dr. Billy Fischer  Outpatient Primary MD for the patient is Antony Contras, MD  Patient coming from: Home  Chief Complaint  Patient presents with  . Shortness of Breath  . Chest Pain      HPI:    Amber [name]  is a 82 y.o. female,.. History of atrial fibrillation on chronic anticoagulation with Coumadin, chronic diastolic CHF, EF 69-45%, COPD, CVA, mild dementia, hypertension, depression, hypothyroidism came to hospital with worsening shortness of breath of one day duration.  Patient is a poor historian due to underlying dementia, as per daughter she noted that patient was having difficulty breathing today and was having difficulty ambulating due to shortness of breath.  Patient was on high-dose Lasix of at least 80 mg daily and 40 at bedtime, which was changed by her PCP to 20 mg p.o. Daily. In the ED chest x-ray shows mild to moderate left pleural effusion , with opacity at lung base. Patient was given Zithromax in the ED.  BNP 266. Patient's daughter gave her additional dose of 40 mg Lasix in addition to 20 mg before coming to ED. Chest x-ray does not show any pulmonary edema.  She denies nausea vomiting or diarrhea Denies dizziness or passing out. Denies dysuria, urgency or frequency of urination. Denies chest pain   Review of systems:      All other systems reviewed and are negative.   With Past History of the following :    Past Medical History:  Diagnosis Date  . Abnormality of gait 08/02/2014  . Atrial fibrillation ([place])    a. chronic/rate controlled;  b. chronic coumadin.  [name] Cerebrovascular disease 05/21/2016  . Cervical spinal stenosis   . Chronic diastolic CHF (congestive heart failure) ([name])    a. 02/2013 Echo: EF 55-60%, mild AI, mod  dil LA.  [name] COPD with emphysema ([place])    PFT 05/02/10>>FEV1 1.35(62%), FEV1% 66, DLCO 75%  . Coronary artery disease    a. s/p CABG;  b. 04/2010 Neg MV  . CVA (cerebral vascular accident) ([name])    left sided weakness  . Dementia   . Depression   . Fibromyalgia   . GERD (gastroesophageal reflux disease)    pepcid 2-3 times per week  . Gout   . Hemiparesis and alteration of sensations as late effects of stroke ([name]) 01/17/2015  . History of melanoma    squamous cell, melanoma  . Hyperlipidemia   . Hypertension   . Hypothyroidism   . Insomnia   . Intermittent confusion   . Memory change 01/23/2014  . Nasal polyposis   . Osteoarthritis   . Osteoporosis   . Pneumonia    1990      Past Surgical History:  Procedure Laterality Date  . ABDOMINAL HYSTERECTOMY    . BREAST LUMPECTOMY  1980s   Benign lesion - right  . carpel tunnel  right  . CORONARY ARTERY BYPASS GRAFT  2000  . EYE SURGERY     bilateral cataracts  . IR GENERIC HISTORICAL  03/31/2016   IR RADIOLOGIST EVAL & MGMT 03/31/2016 [facility]  . RADIOLOGY WITH ANESTHESIA  12/24/2011   Procedure: RADIOLOGY WITH ANESTHESIA;  Surgeon: Medication Radiologist, MD;  Location: [facility];  Service: Radiology;  Laterality: N/A;  Extra Cranial Vascular Stent  . RADIOLOGY WITH ANESTHESIA N/A 07/19/2014   Procedure: ANGIOPLASTY;  Surgeon: Luanne Bras, MD;  Location: [facility];  Service: Radiology;  Laterality: N/A;  . RADIOLOGY WITH ANESTHESIA N/A 07/27/2014   Procedure: ANGIOPLASTY;  Surgeon: Luanne Bras, MD;  Location: [facility];  Service: Radiology;  Laterality: N/A;  . TONSILLECTOMY        Social History:      Social History   Tobacco Use  . Smoking status: Former Smoker    Packs/day: 1.50    Years: 25.00    Pack years: 37.50    Types: Cigarettes    Last attempt to quit: 02/10/1977    Years since quitting: 39.9  . Smokeless tobacco: Never Used  Substance Use Topics  . Alcohol use: No       Family History :       Family History  Problem Relation Age of Onset  . Stroke Mother   . CAD Father   . Diabetes type II Brother   . CAD Brother      Home Medications:   Prior to Admission medications   Medication Sig Start Date End Date Taking? Authorizing Provider  albuterol (PROVENTIL HFA;VENTOLIN HFA) 108 (90 Base) MCG/ACT inhaler Inhale 2 puffs into the lungs every 4 (four) hours as needed for wheezing or shortness of breath. 07/31/16  Yes Joy, Shawn C, PA-C  allopurinol (ZYLOPRIM) 300 MG tablet Take 1 tablet (300 mg total) by mouth every other day. Patient taking differently: Take 300 mg by mouth daily.  03/06/16  Yes Burtis Junes, NP  amitriptyline (ELAVIL) 10 MG tablet TAKE 2 TABLETS BY MOUTH AT BEDTIME Patient taking differently: TAKE 20mg   BY MOUTH AT BEDTIME 09/01/16  Yes Kathrynn Ducking, MD  amLODipine (NORVASC) 5 MG tablet TAKE 1 TABLET BY MOUTH AT BEDTIME 04/25/16  Yes Belva Crome, MD  celecoxib (CELEBREX) 200 MG capsule Take 200 mg by mouth every morning.    Yes [provider]  citalopram (CELEXA) 20 MG tablet TAKE 1 TABLET BY MOUTH TWICE A DAY 11/09/14  Yes Darlin Coco, MD  clopidogrel (PLAVIX) 75 MG tablet Take 1 tablet (75 mg total) by mouth daily. 09/01/16  Yes Belva Crome, MD  COUMADIN 5 MG tablet TAKE AS DIRECTED BY COUMADIN CLINIC 05/12/16  Yes Belva Crome, MD  digoxin Fonnie [place]ken[place]) 0.125 MG tablet Take 1 tablet (0.125 mg total) by mouth on Mondays, Wednesdays, & Fridays 03/27/16  Yes Belva Crome, MD  donepezil (ARICEPT) 10 MG tablet Take 1 tablet (10 mg total) by mouth at bedtime. Patient taking differently: Take 10 mg by mouth daily.  06/13/16  Yes Kathrynn Ducking, MD  ergocalciferol (VITAMIN D2) 50000 UNITS capsule Take 50,000 Units by mouth 2 (two) times a week. Tuesday and Friday   Yes [provider]  furosemide (LASIX) 80 MG tablet Take 1 tablet (80 mg total) by mouth daily. In the AM Patient taking differently: Take 20 mg by mouth daily. In the AM  10/09/15 01/01/17 Yes Belva Crome, MD  KLOR-CON M20 20 MEQ tablet Take 20 mEq  by mouth daily.  03/11/16  Yes [provider]  levothyroxine (SYNTHROID, LEVOTHROID) 25 MCG tablet Take 25 mcg by mouth every morning.    Yes [provider]  losartan (COZAAR) 25 MG tablet Take 1 tablet (25 mg total) by mouth 2 (two) times daily. Patient taking differently: Take 25 mg by mouth daily.  09/26/16 01/01/17 Yes Burtis Junes, NP  memantine (NAMENDA XR) 28 MG CP24 24 hr capsule TAKE ONE CAPSULE BY MOUTH EVERY DAY 07/15/16  Yes Kathrynn Ducking, MD  metoprolol succinate (TOPROL-XL) 25 MG 24 hr tablet TAKE 1 TABLET (25 MG TOTAL) BY MOUTH DAILY. Patient taking differently: TAKE 1 TABLET (25 MG TOTAL) BY MOUTH DAILY. Morning 05/16/16  Yes Belva Crome, MD  nitroGLYCERIN (NITROSTAT) 0.4 MG SL tablet Place 1 tablet (0.4 mg total) under the tongue every 5 (five) minutes as needed for chest pain. 09/13/12  Yes Darlin Coco, MD  PROAIR HFA 108 (90 BASE) MCG/ACT inhaler INHALE 2 PUFFS INTO LUNGS EVERY 6 HOURS AS NEEDED FOR WHEEZING Patient not taking: Reported on 01/01/2017 12/22/13   Darlin Coco, MD     Allergies:     Allergies  Allergen Reactions  . Ezetimibe Other (See Comments)    Myalgia  . Welchol [Colesevelam Hcl] Other (See Comments)    Muscle aches  . Adhesive [Tape] Itching, Rash and Other (See Comments)    Burning   . Ceclor [Cefaclor] Other (See Comments)    Unknown allergic reaction  . Elastic Bandages & [Zinc] Rash and Other (See Comments)    Turns red on the areas it touches  . Latex Itching, Rash and Other (See Comments)    Burning   . Lipitor [Atorvastatin Calcium] Other (See Comments)    Increased fibromyalgia pain  . Mevacor [Lovastatin] Other (See Comments)    Increased fibromyalgia pain  . Pravachol Other (See Comments)    Increased fibromyalgia pain  . Vasotec Other (See Comments)    Unknown allergic reaction     Physical Exam:   Vitals  Blood  pressure (!) 135/119, pulse (!) 58, temperature 98 F (36.7 C), temperature source Oral, resp. rate 20, height 5\' 6"  (1.676 m), weight 68.9 kg (152 lb), SpO2 97 %.  1.  General: Appears in no acute distress  2. Psychiatric:  Intact judgement and  insight, awake alert, oriented x 3.  3. Neurologic: No focal neurological deficits, all cranial nerves intact.Strength 5/5 all 4 extremities, sensation intact all 4 extremities, plantars down going.  4. Eyes :  anicteric sclerae, moist conjunctivae with no lid lag. PERRLA.  5. ENMT:  Oropharynx clear with moist mucous membranes and good dentition  6. Neck:  supple, no cervical lymphadenopathy appriciated, No thyromegaly positive JVD  7. Respiratory : Normal respiratory effort, mildly reduced breath sounds at left lung base, no wheezing or crackles auscultated   8. Cardiovascular : RRR, no gallops, rubs or murmurs, no leg edema  9. Gastrointestinal:  Positive bowel sounds, abdomen soft, non-tender to palpation,no hepatosplenomegaly, no rigidity or guarding       10. Skin:  No cyanosis, normal texture and turgor, no rash, lesions or ulcers  11.Musculoskeletal:  Good muscle tone,  joints appear normal , no effusions,  normal range of motion    Data Review:    CBC Recent Labs  Lab 01/01/17 1915  WBC 9.0  HGB 14.6  HCT 44.4  PLT 214  MCV 91.4  MCH 30.0  MCHC 32.9  RDW 14.5  LYMPHSABS 1.3  MONOABS 0.7  EOSABS 0.1  BASOSABS 0.0   ------------------------------------------------------------------------------------------------------------------  Chemistries  Recent Labs  Lab 01/01/17 1915  NA 137  K 3.1*  CL 100*  CO2 26  GLUCOSE 164*  BUN 9  CREATININE 0.88  CALCIUM 9.3  AST 29  ALT 15  ALKPHOS 70  BILITOT 1.5*    ------------------------------------------------------------------------------------------------------------------  ------------------------------------------------------------------------------------------------------------------ GFR: Estimated Creatinine Clearance: 47.7 mL/min (by C-G formula based on SCr of 0.88 mg/dL). Liver Function Tests: Recent Labs  Lab 01/01/17 1915  AST 29  ALT 15  ALKPHOS 70  BILITOT 1.5*  PROT 8.0  ALBUMIN 4.7   No results for input(s): LIPASE, AMYLASE in the last 168 hours. No results for input(s): AMMONIA in the last 168 hours. Coagulation Profile: Recent Labs  Lab 01/01/17 1915  INR 3.56     --------------------------------------------------------------------------------------------------------------- Urine analysis:    Component Value Date/Time   COLORURINE YELLOW 07/31/2016 2106   APPEARANCEUR HAZY (A) 07/31/2016 2106   LABSPEC 1.012 07/31/2016 2106   PHURINE 5.0 07/31/2016 2106   GLUCOSEU NEGATIVE 07/31/2016 2106   GLUCOSEU NEGATIVE 02/28/2013 1235   HGBUR SMALL (A) 07/31/2016 2106   BILIRUBINUR NEGATIVE 07/31/2016 2106   KETONESUR NEGATIVE 07/31/2016 2106   PROTEINUR NEGATIVE 07/31/2016 2106   UROBILINOGEN 0.2 02/28/2013 1235   NITRITE NEGATIVE 07/31/2016 2106   LEUKOCYTESUR LARGE (A) 07/31/2016 2106      Imaging Results:    Dg Chest 2 View  Result Date: 01/01/2017 CLINICAL DATA:  SOB and wheezing x 2 days per pt; hx a fib, COPD, CAD; HTN; PNA, ex smoker EXAM: CHEST  2 VIEW COMPARISON:  07/31/2016 FINDINGS: Small moderate left pleural effusion obscures hemidiaphragm, new since the prior exam. There is associated opacity at the left lung base, most likely atelectasis. Remainder of the lungs is clear. No right pleural effusion. No pneumothorax. Changes from cardiac surgery are stable. Cardiac silhouette is top-normal in size. No mediastinal or hilar masses or convincing adenopathy. Stable compression fractures at the  thoracolumbar junction and midthoracic spine are noted. Bones are demineralized. IMPRESSION: 1. Small to moderate left pleural effusion with associated lung base opacity. The lung base opacity may reflect atelectasis or pneumonia, with atelectasis favored. This is new since the prior exam. No pulmonary edema. Electronically Signed   By: Lajean Manes M.D.   On: 01/01/2017 19:23    My personal review of EKG: Rhythm-  atrial fibrillation   Assessment & Plan:    Active Problems:   Atrial fibrillation (HCC)   Chronic diastolic heart failure ([facility])   Hypothyroidism   Memory change   Acute bronchitis   1. Acute bronchitis-start duo nebs every 6 hours, Solu-Medrol 60 mg IV every 6 hours, Zithromax 500 mg IV daily. 2. ?  Early pneumonia-chest x-ray shows opacity in left lung base which is new, I compared to previous chest x-rays and patient does have chronic mild left pleural effusion for many years.  Nevertheless we will start patient on antibiotics as above.  Will obtain blood cultures.  Follow culture results. 3. Chronic diastolic CHF-patient's exam not consistent with acute CHF, I reviewed echocardiogram and she does have mild pulmonary hypertension with PA pressures 49 mmHg as of echocardiogram in 2015.  Will repeat echocardiogram in a.m. she may need to be followed up by cardiology as outpatient.  Will increase dose of Lasix to 40 mg p.o. Daily 4. Atrial fibrillation-patient is on digoxin, anticoagulation with Coumadin.  Consult pharmacy to manage Coumadin dosing. 5. Hypothyroidism-continue Synthroid 6. CAD status post  CABG in 2000-continue Plavix, metoprolol 7. Mild dementia-continue Namenda, amitriptyline   DVT Prophylaxis-   Lovenox  AM Labs Ordered, also please review Full Orders  Family Communication: Admission, patients condition and plan of care including tests being ordered have been discussed with the patient and her daughter at bedside who indicate understanding and agree with the  plan and Code Status.  Code Status: DNR  Admission status: Observation  Time spent in minutes : 60 minutes   Oswald Hillock M.D on 01/01/2017 at 11:21 PM  Between 7am to 7pm - Pager - [phone]. After 7pm go to [email or web address] - password [facility]  [facility] - Office  [phone]

## 2017-01-01 NOTE — ED Notes (Signed)
[place] redrawn for BNP. Lab called and states will run test. MD updated.

## 2017-01-01 NOTE — ED Notes (Signed)
ED Provider at bedside. 

## 2017-01-01 NOTE — ED Provider Notes (Signed)
[facility] DEPT Provider Note   CSN: [id number] Arrival date & time: 01/01/17  1853     History   Chief Complaint Chief Complaint  Patient presents with  . Shortness of Breath  . Chest Pain    HPI Amber [name] is a 82 y.o. female.  HPI   Yesterday began to have chest pain and shortness of breath, worsening today Mild dry cough  Hx of CHF, chronic diastolic for years, has been on as high as 200mg  lasix daily but is on 20mg  lasix now. Today didn't have dose until 3PM, then daughter gave an extra 40mg   6 yr ago had strep pneumonia and was in ICU August this year had pneumonia  Mild left sided weakness from prior CVA, walks with brace and walker, mild dementia , on medications Hasn't eaten well for a few days, generalized weakness Patient reported she had gained 2lb Reports today she just wanted to sleep, sleeping in recliner because can't lay flat in bed.  Over the last few days feels like not getting full breaths. Feels that way with CHF exacerbation or pneumonia.   Past Medical History:  Diagnosis Date  . Abnormality of gait 08/02/2014  . Atrial fibrillation ([place])    a. chronic/rate controlled;  b. chronic coumadin.  [name] Cerebrovascular disease 05/21/2016  . Cervical spinal stenosis   . Chronic diastolic CHF (congestive heart failure) ([place])    a. 02/2013 Echo: EF 55-60%, mild AI, mod dil LA.  [name] COPD with emphysema ([place])    PFT 05/02/10>>FEV1 1.35(62%), FEV1% 66, DLCO 75%  . Coronary artery disease    a. s/p CABG;  b. 04/2010 Neg MV  . CVA (cerebral vascular accident) ([place])    left sided weakness  . Dementia   . Depression   . Fibromyalgia   . GERD (gastroesophageal reflux disease)    pepcid 2-3 times per week  . Gout   . Hemiparesis and alteration of sensations as late effects of stroke ([place]) 01/17/2015  . History of melanoma    squamous cell, melanoma  . Hyperlipidemia   . Hypertension   . Hypothyroidism   . Insomnia   .  Intermittent confusion   . Memory change 01/23/2014  . Nasal polyposis   . Osteoarthritis   . Osteoporosis   . Pneumonia    1990    Patient Active Problem List   Diagnosis Date Noted  . Encounter for therapeutic drug monitoring 10/20/2016  . Cerebrovascular disease 05/21/2016  . Hemiparesis and alteration of sensations as late effects of stroke ([place]) 01/17/2015  . Memory change 01/23/2014  . Delirium due to multiple etiologies 02/14/2013  . 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 Long-term (current) use of anticoagulants   . Streptococcal pneumonia ([place]) 02/12/2013  . Vertebral artery stenosis 12/18/2011  . COPD (chronic obstructive pulmonary disease) ([place]) 06/04/2010  . Hypertensive heart and chronic kidney disease with heart failure and stage 1 through stage 4 chronic kidney disease, or chronic kidney disease ([place]) 05/29/2010  . Fibromyalgia 05/10/2010  . Depression 05/10/2010  . Cervical spinal stenosis 05/10/2010  . Hypothyroidism 05/10/2010  . Osteoporosis 05/10/2010  . Coronary artery disease involving coronary bypass graft of native heart with angina pectoris ([place]) 05/10/2010  . Chronic diastolic heart failure ([place])   . Dyslipidemia   . Atrial fibrillation ([place]) 05/06/2010  . Unspecified cerebral artery occlusion with cerebral infarction 05/06/2010    Past Surgical History:  Procedure Laterality Date  . ABDOMINAL HYSTERECTOMY    . BREAST  LUMPECTOMY  1980s   Benign lesion - right  . carpel tunnel     right  . CORONARY ARTERY BYPASS GRAFT  2000  . EYE SURGERY     bilateral cataracts  . IR GENERIC HISTORICAL  03/31/2016   IR RADIOLOGIST EVAL & MGMT 03/31/2016 [facility]-INTERV RAD  . RADIOLOGY WITH ANESTHESIA  12/24/2011   Procedure: RADIOLOGY WITH ANESTHESIA;  Surgeon: Medication Radiologist, MD;  Location: [facility];  Service: Radiology;  Laterality: N/A;  Extra Cranial Vascular Stent  . RADIOLOGY WITH ANESTHESIA N/A 07/19/2014   Procedure: ANGIOPLASTY;  Surgeon: Luanne Bras, MD;  Location: [facility];  Service: Radiology;  Laterality: N/A;  . RADIOLOGY WITH ANESTHESIA N/A 07/27/2014   Procedure: ANGIOPLASTY;  Surgeon: Luanne Bras, MD;  Location: [facility];  Service: Radiology;  Laterality: N/A;  . TONSILLECTOMY      OB History    No data available       Home Medications    Prior to Admission medications   Medication Sig Start Date End Date Taking? Authorizing Provider  albuterol (PROVENTIL HFA;VENTOLIN HFA) 108 (90 Base) MCG/ACT inhaler Inhale 2 puffs into the lungs every 4 (four) hours as needed for wheezing or shortness of breath. 07/31/16  Yes Joy, Shawn C, PA-C  allopurinol (ZYLOPRIM) 300 MG tablet Take 1 tablet (300 mg total) by mouth every other day. Patient taking differently: Take 300 mg by mouth daily.  03/06/16  Yes Burtis Junes, NP  amitriptyline (ELAVIL) 10 MG tablet TAKE 2 TABLETS BY MOUTH AT BEDTIME Patient taking differently: TAKE 20mg   BY MOUTH AT BEDTIME 09/01/16  Yes Kathrynn Ducking, MD  amLODipine (NORVASC) 5 MG tablet TAKE 1 TABLET BY MOUTH AT BEDTIME 04/25/16  Yes Belva Crome, MD  celecoxib (CELEBREX) 200 MG capsule Take 200 mg by mouth every morning.    Yes [provider]  citalopram (CELEXA) 20 MG tablet TAKE 1 TABLET BY MOUTH TWICE A DAY 11/09/14  Yes Darlin Coco, MD  clopidogrel (PLAVIX) 75 MG tablet Take 1 tablet (75 mg total) by mouth daily. 09/01/16  Yes Belva Crome, MD  COUMADIN 5 MG tablet TAKE AS DIRECTED BY COUMADIN CLINIC 05/12/16  Yes Belva Crome, MD  digoxin Fonnie Birkenhead) 0.125 MG tablet Take 1 tablet (0.125 mg total) by mouth on Mondays, Wednesdays, & Fridays 03/27/16  Yes Belva Crome, MD  donepezil (ARICEPT) 10 MG tablet Take 1 tablet (10 mg total) by mouth at bedtime. Patient taking differently: Take 10 mg by mouth daily.  06/13/16  Yes Kathrynn Ducking, MD  ergocalciferol (VITAMIN D2) 50000 UNITS capsule Take 50,000 Units by mouth 2 (two) times a week. Tuesday and Friday   Yes [provider]  furosemide (LASIX) 80 MG tablet Take 1 tablet (80 mg total) by mouth daily. In the AM Patient taking differently: Take 20 mg by mouth daily. In the AM 10/09/15 01/01/17 Yes Belva Crome, MD  KLOR-CON M20 20 MEQ tablet Take 20 mEq by mouth daily.  03/11/16  Yes [provider]  levothyroxine (SYNTHROID, LEVOTHROID) 25 MCG tablet Take 25 mcg by mouth every morning.    Yes [provider]  losartan (COZAAR) 25 MG tablet Take 1 tablet (25 mg total) by mouth 2 (two) times daily. Patient taking differently: Take 25 mg by mouth daily.  09/26/16 01/01/17 Yes Burtis Junes, NP  memantine (NAMENDA XR) 28 MG CP24 24 hr capsule TAKE ONE CAPSULE BY MOUTH EVERY DAY 07/15/16  Yes Margette Fast  K, MD  metoprolol succinate (TOPROL-XL) 25 MG 24 hr tablet TAKE 1 TABLET (25 MG TOTAL) BY MOUTH DAILY. Patient taking differently: TAKE 1 TABLET (25 MG TOTAL) BY MOUTH DAILY. Morning 05/16/16  Yes Belva Crome, MD  nitroGLYCERIN (NITROSTAT) 0.4 MG SL tablet Place 1 tablet (0.4 mg total) under the tongue every 5 (five) minutes as needed for chest pain. 09/13/12  Yes Darlin Coco, MD  PROAIR HFA 108 (90 BASE) MCG/ACT inhaler INHALE 2 PUFFS INTO LUNGS EVERY 6 HOURS AS NEEDED FOR WHEEZING Patient not taking: Reported on 01/01/2017 12/22/13   Darlin Coco, MD    Family History Family History  Problem Relation Age of Onset  . Stroke Mother   . CAD Father   . Diabetes type II Brother   . CAD Brother     Social History Social History   Tobacco Use  . Smoking status: Former Smoker    Packs/day: 1.50    Years: 25.00    Pack years: 37.50    Types: Cigarettes    Last attempt to quit: 02/10/1977    Years since quitting: 39.9  . Smokeless tobacco: Never Used  Substance Use Topics  . Alcohol use: No  . Drug use: No     Allergies   Ezetimibe; Welchol [colesevelam hcl]; Adhesive [tape]; Ceclor [cefaclor]; Elastic bandages & [zinc]; Latex; Lipitor [atorvastatin calcium];  Mevacor [lovastatin]; Pravachol; and Vasotec   Review of Systems Review of Systems  Constitutional: Positive for fatigue. Negative for fever.  HENT: Positive for rhinorrhea (chronic).   Respiratory: Positive for cough, shortness of breath and wheezing.   Cardiovascular: Positive for chest pain. Negative for leg swelling.  Gastrointestinal: Negative for abdominal pain, nausea and vomiting. Diarrhea: last Sunday, has it frequently but none in last few days.  Genitourinary: Negative for difficulty urinating and dysuria.  Neurological: Negative for syncope and light-headedness.     Physical Exam Updated Vital Signs BP (!) 177/78   Pulse 65   Temp 98 F (36.7 C) (Oral)   Resp 18   Ht 5\' 6"  (1.676 m)   Wt 68.9 kg (152 lb)   SpO2 98%   BMI 24.53 kg/m   Physical Exam  Constitutional: She is oriented to person, place, and time. She appears well-developed and well-nourished. No distress.  HENT:  Head: Normocephalic and atraumatic.  Eyes: Conjunctivae and EOM are normal.  Neck: Normal range of motion.  Cardiovascular: Normal rate, normal heart sounds and intact distal pulses. An irregularly irregular rhythm present. Exam reveals no gallop and no friction rub.  No murmur heard. Pulmonary/Chest: Effort normal. No respiratory distress. She has no wheezes. She has rales (bibasilar, worse on left, decreased breath sounds left base).  Abdominal: Soft. She exhibits no distension. There is no tenderness. There is no guarding.  Musculoskeletal: She exhibits no edema or tenderness.  Neurological: She is alert and oriented to person, place, and time.  Skin: Skin is warm and dry. No rash noted. She is not diaphoretic. No erythema.  Nursing note and vitals reviewed.    ED Treatments / Results  Labs (all labs ordered are listed, but only abnormal results are displayed) Labs Reviewed  BASIC METABOLIC PANEL - Abnormal; Notable for the following components:      Result Value   Potassium 3.1  (*)    Chloride 100 (*)    Glucose, Bld 164 (*)    All other components within normal limits  HEPATIC FUNCTION PANEL - Abnormal; Notable for the following components:  Total Bilirubin 1.5 (*)    Indirect Bilirubin 1.3 (*)    All other components within normal limits  PROTIME-INR - Abnormal; Notable for the following components:   Prothrombin Time 35.3 (*)    All other components within normal limits  DIGOXIN LEVEL - Abnormal; Notable for the following components:   Digoxin Level 0.4 (*)    All other components within normal limits  BRAIN NATRIURETIC PEPTIDE - Abnormal; Notable for the following components:   B Natriuretic Peptide 266.5 (*)    All other components within normal limits  CBC  DIFFERENTIAL  I-STAT TROPONIN, ED    EKG  EKG Interpretation  Date/Time:  Thursday January 01 2017 19:02:33 EST Ventricular Rate:  72 PR Interval:    QRS Duration: 78 QT Interval:  443 QTC Calculation: 485 R Axis:   67 Text Interpretation:  Atrial fibrillation Ventricular premature complex Repol abnrm suggests ischemia, diffuse leads Baseline wander No significant change since last tracing Confirmed by Gareth Morgan [phone]) on 01/01/2017 7:08:14 PM       Radiology Dg Chest 2 View  Result Date: 01/01/2017 CLINICAL DATA:  SOB and wheezing x 2 days per pt; hx a fib, COPD, CAD; HTN; PNA, ex smoker EXAM: CHEST  2 VIEW COMPARISON:  07/31/2016 FINDINGS: Small moderate left pleural effusion obscures hemidiaphragm, new since the prior exam. There is associated opacity at the left lung base, most likely atelectasis. Remainder of the lungs is clear. No right pleural effusion. No pneumothorax. Changes from cardiac surgery are stable. Cardiac silhouette is top-normal in size. No mediastinal or hilar masses or convincing adenopathy. Stable compression fractures at the thoracolumbar junction and midthoracic spine are noted. Bones are demineralized. IMPRESSION: 1. Small to moderate left pleural  effusion with associated lung base opacity. The lung base opacity may reflect atelectasis or pneumonia, with atelectasis favored. This is new since the prior exam. No pulmonary edema. Electronically Signed   By: Lajean Manes M.D.   On: 01/01/2017 19:23    Procedures Procedures (including critical care time)  Medications Ordered in ED Medications  azithromycin (ZITHROMAX) tablet 500 mg (500 mg Oral Given 01/01/17 2010)  ipratropium-albuterol (DUONEB) 0.5-2.5 (3) MG/3ML nebulizer solution 3 mL (3 mLs Nebulization Given 01/01/17 2010)  potassium chloride SA (K-DUR,KLOR-CON) CR tablet 40 mEq (40 mEq Oral Given 01/01/17 2118)  methylPREDNISolone sodium succinate (SOLU-MEDROL) 125 mg/2 mL injection 125 mg (125 mg Intravenous Given 01/01/17 2150)     Initial Impression / Assessment and Plan / ED Course  I have reviewed the triage vital signs and the nursing notes.  Pertinent labs & imaging results that were available during my care of the patient were reviewed by me and considered in my medical decision making (see chart for details).     82 year old female with a history of atrial fibrillation on Coumadin, chronic diastolic CHF, COPD, CVA with left sided weakness, htn, hlpd, CAD, mild dementia, presents with concern for shortness of breath.  Differential diagnosis for dyspnea includes ACS, COPD exacerbation, CHF exacerbation, anemia, pneumonia.  Chest x-ray was done which showed pleural effusion, possible atelectasis versus pneumonia. EKG was evaluated by me which showed atrial fibrillation.  No asymmetric swelling to suggest DVT.   BNP 266.    XR shows possible pneumonia, however no leukocytosis, no fever, and overall feel most likely symptoms are related to fluid overload and possible COPD exacerbation.    Given tachypnea, dyspnea at rest and minimal exertion, will admit for further care.  Patient given duonebs, solumedrol, azithromycin  po to cover possible CAP.    Final Clinical  Impressions(s) / ED Diagnoses   Final diagnoses:  COPD exacerbation ([place])  Acute on chronic diastolic congestive heart failure [facility])    ED Discharge Orders    None       Gareth Morgan, MD 01/01/17 2307

## 2017-01-01 NOTE — ED Notes (Signed)
Spoke with Manuela Schwartz from lab and reports adding on additional lab orders.

## 2017-01-01 NOTE — ED Notes (Signed)
ED TO INPATIENT HANDOFF REPORT  Name/Age/Gender Amber [name] 82 y.o. female  Code Status Code Status History    Date Active Date Inactive Code Status Order ID Comments User Context   07/27/2014 16:10 07/30/2014 12:41 Full Code [id number]  Luanne Bras, MD Inpatient   06/30/2014 14:21 07/01/2014 03:16 Full Code [id number]  Luanne Bras, MD HOV   02/13/2013 01:11 02/19/2013 14:18 Full Code [id number]  Toy Baker, MD Inpatient    Advance Directive Documentation     Most Recent Value  Type of Advance Directive  Healthcare Power of Attorney, Living will  Pre-existing out of facility DNR order (yellow form or pink MOST form)  No data  "MOST" Form in Place?  No data      Home/SNF/Other Home  Chief Complaint Shortness of Breath   Level of Care/Admitting Diagnosis ED Disposition    ED Disposition Condition Comment   Admit  Hospital Area: [facility] [[id number]]  Level of Care: Telemetry [5]  Admit to tele based on following criteria: Complex arrhythmia (Bradycardia/Tachycardia)  Diagnosis: Acute bronchitis [466.0.ICD-9-CM]  Admitting Physician: Loree Fee  Attending Physician: Oswald Hillock [[id number]]  PT Class (Do Not Modify): Observation [104]  PT Acc Code (Do Not Modify): Observation [10022]       Medical History Past Medical History:  Diagnosis Date  . Abnormality of gait 08/02/2014  . Atrial fibrillation ([place])    a. chronic/rate controlled;  b. chronic coumadin.  [name] Cerebrovascular disease 05/21/2016  . Cervical spinal stenosis   . Chronic diastolic CHF (congestive heart failure) ([place])    a. 02/2013 Echo: EF 55-60%, mild AI, mod dil LA.  [name] COPD with emphysema ([place])    PFT 05/02/10>>FEV1 1.35(62%), FEV1% 66, DLCO 75%  . Coronary artery disease    a. s/p CABG;  b. 04/2010 Neg MV  . CVA (cerebral vascular accident) ([place])    left sided weakness  . Dementia   . Depression   . Fibromyalgia   . GERD (gastroesophageal reflux disease)    pepcid 2-3 times per week  . Gout   . Hemiparesis and alteration of sensations as late effects of stroke ([place]) 01/17/2015  . History of melanoma    squamous cell, melanoma  . Hyperlipidemia   . Hypertension   . Hypothyroidism   . Insomnia   . Intermittent confusion   . Memory change 01/23/2014  . Nasal polyposis   . Osteoarthritis   . Osteoporosis   . Pneumonia    1990    Allergies Allergies  Allergen Reactions  . Ezetimibe Other (See Comments)    Myalgia  . Welchol [Colesevelam Hcl] Other (See Comments)    Muscle aches  . Adhesive [Tape] Itching, Rash and Other (See Comments)    Burning   . Ceclor [Cefaclor] Other (See Comments)    Unknown allergic reaction  . Elastic Bandages & [Zinc] Rash and Other (See Comments)    Turns red on the areas it touches  . Latex Itching, Rash and Other (See Comments)    Burning   . Lipitor [Atorvastatin Calcium] Other (See Comments)    Increased fibromyalgia pain  . Mevacor [Lovastatin] Other (See Comments)    Increased fibromyalgia pain  . Pravachol Other (See Comments)    Increased fibromyalgia pain  . Vasotec Other (See Comments)    Unknown allergic reaction    IV Location/Drains/Wounds Patient Lines/Drains/Airways Status   Active Line/Drains/Airways    Name:   Placement date:   Placement time:  Site:   Days:   Peripheral IV 01/01/17 Right Antecubital   01/01/17    1925    Antecubital   less than 1   Urethral Catheter Amy L. RN Non-latex 18 Fr.   07/27/14    1300    Non-latex   889   Incision (Closed) 06/30/14 Groin Right   06/30/14    1333     916   Incision (Closed) 07/27/14 Groin Right   07/27/14    1345     889          Labs/Imaging Results for orders placed or performed during the hospital encounter of 01/01/17 (from the past 48 hour(s))  Basic metabolic panel     Status: Abnormal   Collection Time: 01/01/17  7:15 PM  Result Value Ref Range   Sodium 137 135 - 145 mmol/L   Potassium 3.1 (L) 3.5 - 5.1 mmol/L    Chloride 100 (L) 101 - 111 mmol/L   CO2 26 22 - 32 mmol/L   Glucose, Bld 164 (H) 65 - 99 mg/dL   BUN 9 6 - 20 mg/dL   Creatinine, Ser 0.88 0.44 - 1.00 mg/dL   Calcium 9.3 8.9 - 10.3 mg/dL   GFR calc non Af Amer >60 >60 mL/min   GFR calc Af Amer >60 >60 mL/min    Comment: (NOTE) The eGFR has been calculated using the CKD EPI equation. This calculation has not been validated in all clinical situations. eGFR's persistently <60 mL/min signify possible Chronic Kidney Disease.    Anion gap 11 5 - 15  CBC     Status: None   Collection Time: 01/01/17  7:15 PM  Result Value Ref Range   WBC 9.0 4.0 - 10.5 K/uL   RBC 4.86 3.87 - 5.11 MIL/uL   Hemoglobin 14.6 12.0 - 15.0 g/dL   HCT 44.4 36.0 - 46.0 %   MCV 91.4 78.0 - 100.0 fL   MCH 30.0 26.0 - 34.0 pg   MCHC 32.9 30.0 - 36.0 g/dL   RDW 14.5 11.5 - 15.5 %   Platelets 214 150 - 400 K/uL  Hepatic function panel     Status: Abnormal   Collection Time: 01/01/17  7:15 PM  Result Value Ref Range   Total Protein 8.0 6.5 - 8.1 g/dL   Albumin 4.7 3.5 - 5.0 g/dL   AST 29 15 - 41 U/L   ALT 15 14 - 54 U/L   Alkaline Phosphatase 70 38 - 126 U/L   Total Bilirubin 1.5 (H) 0.3 - 1.2 mg/dL   Bilirubin, Direct 0.2 0.1 - 0.5 mg/dL   Indirect Bilirubin 1.3 (H) 0.3 - 0.9 mg/dL  Protime-INR     Status: Abnormal   Collection Time: 01/01/17  7:15 PM  Result Value Ref Range   Prothrombin Time 35.3 (H) 11.4 - 15.2 seconds   INR 3.56   Digoxin level     Status: Abnormal   Collection Time: 01/01/17  7:15 PM  Result Value Ref Range   Digoxin Level 0.4 (L) 0.8 - 2.0 ng/mL  Differential     Status: None   Collection Time: 01/01/17  7:15 PM  Result Value Ref Range   Neutrophils Relative % 77 %   Neutro Abs 6.9 1.7 - 7.7 K/uL   Lymphocytes Relative 14 %   Lymphs Abs 1.3 0.7 - 4.0 K/uL   Monocytes Relative 8 %   Monocytes Absolute 0.7 0.1 - 1.0 K/uL   Eosinophils Relative 1 %  Eosinophils Absolute 0.1 0.0 - 0.7 K/uL   Basophils Relative 0 %   Basophils  Absolute 0.0 0.0 - 0.1 K/uL  I-stat troponin, ED     Status: None   Collection Time: 01/01/17  7:25 PM  Result Value Ref Range   Troponin i, poc 0.01 0.00 - 0.08 ng/mL   Comment 3            Comment: Due to the release kinetics of cTnI, a negative result within the first hours of the onset of symptoms does not rule out myocardial infarction with certainty. If myocardial infarction is still suspected, repeat the test at appropriate intervals.   Brain natriuretic peptide     Status: Abnormal   Collection Time: 01/01/17 10:07 PM  Result Value Ref Range   B Natriuretic Peptide 266.5 (H) 0.0 - 100.0 pg/mL   Dg Chest 2 View  Result Date: 01/01/2017 CLINICAL DATA:  SOB and wheezing x 2 days per pt; hx a fib, COPD, CAD; HTN; PNA, ex smoker EXAM: CHEST  2 VIEW COMPARISON:  07/31/2016 FINDINGS: Small moderate left pleural effusion obscures hemidiaphragm, new since the prior exam. There is associated opacity at the left lung base, most likely atelectasis. Remainder of the lungs is clear. No right pleural effusion. No pneumothorax. Changes from cardiac surgery are stable. Cardiac silhouette is top-normal in size. No mediastinal or hilar masses or convincing adenopathy. Stable compression fractures at the thoracolumbar junction and midthoracic spine are noted. Bones are demineralized. IMPRESSION: 1. Small to moderate left pleural effusion with associated lung base opacity. The lung base opacity may reflect atelectasis or pneumonia, with atelectasis favored. This is new since the prior exam. No pulmonary edema. Electronically Signed   By: Lajean Manes M.D.   On: 01/01/2017 19:23    Pending Labs [employer] (From admission, onward)   Start     Ordered   Signed and Held  CBC  Tomorrow morning,   R     Signed and Held   Signed and Held  Comprehensive metabolic panel  Tomorrow morning,   R     Signed and Held      Vitals/Pain Today's Vitals   01/01/17 2149 01/01/17 2200 01/01/17 2230 01/01/17  2300  BP: (!) 176/63 (!) 165/65 (!) 177/78 (!) 135/119  Pulse: 66 62 65 (!) 58  Resp: 20 17 18 20   Temp:      TempSrc:      SpO2: 97% 97% 98% 97%  Weight:      Height:      PainSc:        Isolation Precautions No active isolations  Medications Medications  azithromycin (ZITHROMAX) tablet 500 mg (500 mg Oral Given 01/01/17 2010)  ipratropium-albuterol (DUONEB) 0.5-2.5 (3) MG/3ML nebulizer solution 3 mL (3 mLs Nebulization Given 01/01/17 2010)  potassium chloride SA (K-DUR,KLOR-CON) CR tablet 40 mEq (40 mEq Oral Given 01/01/17 2118)  methylPREDNISolone sodium succinate (SOLU-MEDROL) 125 mg/2 mL injection 125 mg (125 mg Intravenous Given 01/01/17 2150)    Mobility Ambulatory with assistance

## 2017-01-01 NOTE — ED Notes (Signed)
Patient transported to X-ray 

## 2017-01-01 NOTE — ED Notes (Signed)
Call report to Sonia Side at 11:45 at [phone]

## 2017-01-02 ENCOUNTER — Other Ambulatory Visit: Payer: Self-pay

## 2017-01-02 ENCOUNTER — Observation Stay (HOSPITAL_BASED_OUTPATIENT_CLINIC_OR_DEPARTMENT_OTHER): Payer: Medicare Other

## 2017-01-02 DIAGNOSIS — I5032 Chronic diastolic (congestive) heart failure: Secondary | ICD-10-CM | POA: Diagnosis not present

## 2017-01-02 DIAGNOSIS — J441 Chronic obstructive pulmonary disease with (acute) exacerbation: Secondary | ICD-10-CM | POA: Diagnosis not present

## 2017-01-02 DIAGNOSIS — I130 Hypertensive heart and chronic kidney disease with heart failure and stage 1 through stage 4 chronic kidney disease, or unspecified chronic kidney disease: Secondary | ICD-10-CM | POA: Diagnosis not present

## 2017-01-02 DIAGNOSIS — J209 Acute bronchitis, unspecified: Secondary | ICD-10-CM | POA: Diagnosis not present

## 2017-01-02 DIAGNOSIS — R413 Other amnesia: Secondary | ICD-10-CM

## 2017-01-02 DIAGNOSIS — I5033 Acute on chronic diastolic (congestive) heart failure: Secondary | ICD-10-CM

## 2017-01-02 DIAGNOSIS — I482 Chronic atrial fibrillation: Secondary | ICD-10-CM | POA: Diagnosis not present

## 2017-01-02 DIAGNOSIS — J90 Pleural effusion, not elsewhere classified: Secondary | ICD-10-CM | POA: Diagnosis not present

## 2017-01-02 DIAGNOSIS — I361 Nonrheumatic tricuspid (valve) insufficiency: Secondary | ICD-10-CM

## 2017-01-02 DIAGNOSIS — I4891 Unspecified atrial fibrillation: Secondary | ICD-10-CM | POA: Diagnosis not present

## 2017-01-02 DIAGNOSIS — J9600 Acute respiratory failure, unspecified whether with hypoxia or hypercapnia: Secondary | ICD-10-CM | POA: Diagnosis not present

## 2017-01-02 DIAGNOSIS — E039 Hypothyroidism, unspecified: Secondary | ICD-10-CM | POA: Diagnosis not present

## 2017-01-02 LAB — COMPREHENSIVE METABOLIC PANEL
ALBUMIN: 3.9 g/dL (ref 3.5–5.0)
ALK PHOS: 57 U/L (ref 38–126)
ALT: 13 U/L — ABNORMAL LOW (ref 14–54)
ANION GAP: 9 (ref 5–15)
AST: 26 U/L (ref 15–41)
BILIRUBIN TOTAL: 1.1 mg/dL (ref 0.3–1.2)
BUN: 13 mg/dL (ref 6–20)
CALCIUM: 8.6 mg/dL — AB (ref 8.9–10.3)
CO2: 26 mmol/L (ref 22–32)
Chloride: 104 mmol/L (ref 101–111)
Creatinine, Ser: 0.83 mg/dL (ref 0.44–1.00)
GFR calc non Af Amer: >60 mL/min (ref >60)
Glucose, Bld: 195 mg/dL — ABNORMAL HIGH (ref 65–99)
POTASSIUM: 3.8 mmol/L (ref 3.5–5.1)
SODIUM: 139 mmol/L (ref 135–145)
TOTAL PROTEIN: 6.6 g/dL (ref 6.5–8.1)

## 2017-01-02 LAB — CBC
HEMATOCRIT: 38.5 % (ref 36.0–46.0)
HEMOGLOBIN: 12.7 g/dL (ref 12.0–15.0)
MCH: 29.7 pg (ref 26.0–34.0)
MCHC: 33 g/dL (ref 30.0–36.0)
MCV: 90.2 fL (ref 78.0–100.0)
Platelets: 188 10*3/uL (ref 150–400)
RBC: 4.27 MIL/uL (ref 3.87–5.11)
RDW: 14.4 % (ref 11.5–15.5)
WBC: 6.3 10*3/uL (ref 4.0–10.5)

## 2017-01-02 LAB — ECHOCARDIOGRAM COMPLETE
HEIGHTINCHES: 64.5 in
Weight: 2456 oz

## 2017-01-02 LAB — PROTIME-INR
INR: 4.08 — AB
Prothrombin Time: 39.3 seconds — ABNORMAL HIGH (ref 11.4–15.2)

## 2017-01-02 MED ORDER — METOPROLOL SUCCINATE ER 25 MG PO TB24
25.0000 mg | ORAL_TABLET | Freq: Every day | ORAL | Status: DC
  Administered 2017-01-02 – 2017-01-03 (×2): 25 mg via ORAL
  Filled 2017-01-02 (×2): qty 1

## 2017-01-02 MED ORDER — AMLODIPINE BESYLATE 5 MG PO TABS
5.0000 mg | ORAL_TABLET | Freq: Every day | ORAL | Status: DC
  Administered 2017-01-02 (×2): 5 mg via ORAL
  Filled 2017-01-02 (×2): qty 1

## 2017-01-02 MED ORDER — CLOPIDOGREL BISULFATE 75 MG PO TABS
75.0000 mg | ORAL_TABLET | Freq: Every day | ORAL | Status: DC
  Administered 2017-01-02 – 2017-01-03 (×2): 75 mg via ORAL
  Filled 2017-01-02 (×2): qty 1

## 2017-01-02 MED ORDER — DONEPEZIL HCL 10 MG PO TABS
10.0000 mg | ORAL_TABLET | Freq: Every day | ORAL | Status: DC
Start: 2017-01-02 — End: 2017-01-03
  Administered 2017-01-02 – 2017-01-03 (×2): 10 mg via ORAL
  Filled 2017-01-02 (×2): qty 1

## 2017-01-02 MED ORDER — WARFARIN - PHARMACIST DOSING INPATIENT: Freq: Every day | Status: DC

## 2017-01-02 MED ORDER — LOSARTAN POTASSIUM 25 MG PO TABS
25.0000 mg | ORAL_TABLET | Freq: Every day | ORAL | Status: DC
Start: 2017-01-02 — End: 2017-01-03
  Administered 2017-01-02 – 2017-01-03 (×2): 25 mg via ORAL
  Filled 2017-01-02 (×2): qty 1

## 2017-01-02 MED ORDER — ONDANSETRON HCL 4 MG PO TABS: 4.0000 mg | ORAL_TABLET | Freq: Four times a day (QID) | ORAL | Status: DC | PRN

## 2017-01-02 MED ORDER — SODIUM CHLORIDE 0.9 % IV SOLN: 250.0000 mL | INTRAVENOUS | Status: DC | PRN

## 2017-01-02 MED ORDER — ALLOPURINOL 300 MG PO TABS
300.0000 mg | ORAL_TABLET | Freq: Every day | ORAL | Status: DC
  Administered 2017-01-02 – 2017-01-03 (×2): 300 mg via ORAL
  Filled 2017-01-02 (×2): qty 1

## 2017-01-02 MED ORDER — PERFLUTREN LIPID MICROSPHERE
INTRAVENOUS | Status: AC
  Filled 2017-01-02: qty 10

## 2017-01-02 MED ORDER — FUROSEMIDE 40 MG PO TABS
40.0000 mg | ORAL_TABLET | Freq: Every day | ORAL | Status: DC
  Administered 2017-01-02 – 2017-01-03 (×2): 40 mg via ORAL
  Filled 2017-01-02 (×2): qty 1

## 2017-01-02 MED ORDER — ALUM & MAG HYDROXIDE-SIMETH 200-200-20 MG/5ML PO SUSP: 30.0000 mL | ORAL | Status: DC | PRN

## 2017-01-02 MED ORDER — PERFLUTREN LIPID MICROSPHERE
1.0000 mL | INTRAVENOUS | Status: AC | PRN
  Administered 2017-01-02: 4 mL via INTRAVENOUS
  Filled 2017-01-02: qty 10

## 2017-01-02 MED ORDER — TRAMADOL HCL 50 MG PO TABS: 50.0000 mg | ORAL_TABLET | Freq: Four times a day (QID) | ORAL | Status: DC | PRN

## 2017-01-02 MED ORDER — ALBUTEROL SULFATE (2.5 MG/3ML) 0.083% IN NEBU: 2.5000 mg | INHALATION_SOLUTION | Freq: Four times a day (QID) | RESPIRATORY_TRACT | Status: DC

## 2017-01-02 MED ORDER — SODIUM CHLORIDE 0.9% FLUSH: 3.0000 mL | INTRAVENOUS | Status: DC | PRN

## 2017-01-02 MED ORDER — AMITRIPTYLINE HCL 10 MG PO TABS
20.0000 mg | ORAL_TABLET | Freq: Every day | ORAL | Status: DC
  Administered 2017-01-02 (×2): 20 mg via ORAL
  Filled 2017-01-02 (×2): qty 2

## 2017-01-02 MED ORDER — ALBUTEROL SULFATE (2.5 MG/3ML) 0.083% IN NEBU: 2.5000 mg | INHALATION_SOLUTION | RESPIRATORY_TRACT | Status: DC | PRN

## 2017-01-02 MED ORDER — IPRATROPIUM-ALBUTEROL 0.5-2.5 (3) MG/3ML IN SOLN
3.0000 mL | Freq: Three times a day (TID) | RESPIRATORY_TRACT | Status: DC
  Administered 2017-01-03 (×2): 3 mL via RESPIRATORY_TRACT
  Filled 2017-01-02 (×2): qty 3

## 2017-01-02 MED ORDER — MORPHINE SULFATE (PF) 4 MG/ML IV SOLN: 1.0000 mg | INTRAVENOUS | Status: DC | PRN

## 2017-01-02 MED ORDER — ACETAMINOPHEN 325 MG PO TABS
650.0000 mg | ORAL_TABLET | ORAL | Status: DC | PRN
  Administered 2017-01-02 (×2): 650 mg via ORAL
  Filled 2017-01-02 (×2): qty 2

## 2017-01-02 MED ORDER — POTASSIUM CHLORIDE CRYS ER 20 MEQ PO TBCR
20.0000 meq | EXTENDED_RELEASE_TABLET | Freq: Every day | ORAL | Status: DC
  Administered 2017-01-02 – 2017-01-03 (×2): 20 meq via ORAL
  Filled 2017-01-02 (×2): qty 1

## 2017-01-02 MED ORDER — METHYLPREDNISOLONE SODIUM SUCC 125 MG IJ SOLR
60.0000 mg | Freq: Four times a day (QID) | INTRAMUSCULAR | Status: DC
  Administered 2017-01-02 (×2): 60 mg via INTRAVENOUS
  Filled 2017-01-02 (×2): qty 2

## 2017-01-02 MED ORDER — SODIUM CHLORIDE 0.9% FLUSH
3.0000 mL | Freq: Two times a day (BID) | INTRAVENOUS | Status: DC
  Administered 2017-01-02 – 2017-01-03 (×4): 3 mL via INTRAVENOUS

## 2017-01-02 MED ORDER — DIGOXIN 125 MCG PO TABS
0.1250 mg | ORAL_TABLET | ORAL | Status: DC
  Administered 2017-01-02: 0.125 mg via ORAL
  Filled 2017-01-02: qty 1

## 2017-01-02 MED ORDER — IPRATROPIUM BROMIDE 0.02 % IN SOLN: 0.5000 mg | Freq: Four times a day (QID) | RESPIRATORY_TRACT | Status: DC

## 2017-01-02 MED ORDER — IPRATROPIUM-ALBUTEROL 0.5-2.5 (3) MG/3ML IN SOLN
3.0000 mL | Freq: Four times a day (QID) | RESPIRATORY_TRACT | Status: DC
  Administered 2017-01-02 (×3): 3 mL via RESPIRATORY_TRACT
  Filled 2017-01-02 (×4): qty 3

## 2017-01-02 MED ORDER — CITALOPRAM HYDROBROMIDE 20 MG PO TABS
20.0000 mg | ORAL_TABLET | Freq: Two times a day (BID) | ORAL | Status: DC
  Administered 2017-01-02 – 2017-01-03 (×4): 20 mg via ORAL
  Filled 2017-01-02 (×4): qty 1

## 2017-01-02 MED ORDER — LEVOTHYROXINE SODIUM 25 MCG PO TABS
25.0000 ug | ORAL_TABLET | Freq: Every day | ORAL | Status: DC
  Administered 2017-01-02 – 2017-01-03 (×2): 25 ug via ORAL
  Filled 2017-01-02 (×2): qty 1

## 2017-01-02 MED ORDER — DEXTROSE 5 % IV SOLN
500.0000 mg | INTRAVENOUS | Status: DC
  Administered 2017-01-02 – 2017-01-03 (×2): 500 mg via INTRAVENOUS
  Filled 2017-01-02 (×2): qty 500

## 2017-01-02 MED ORDER — PREDNISONE 20 MG PO TABS
40.0000 mg | ORAL_TABLET | Freq: Every day | ORAL | Status: DC
  Administered 2017-01-03: 40 mg via ORAL
  Filled 2017-01-02: qty 2

## 2017-01-02 MED ORDER — ONDANSETRON HCL 4 MG/2ML IJ SOLN: 4.0000 mg | Freq: Four times a day (QID) | INTRAMUSCULAR | Status: DC | PRN

## 2017-01-02 MED ORDER — MEMANTINE HCL ER 28 MG PO CP24
28.0000 mg | ORAL_CAPSULE | Freq: Every day | ORAL | Status: DC
  Administered 2017-01-02 – 2017-01-03 (×2): 28 mg via ORAL
  Filled 2017-01-02 (×2): qty 1

## 2017-01-02 NOTE — Progress Notes (Signed)
ANTICOAGULATION CONSULT NOTE - Initial Consult  Pharmacy Consult for warfarin Indication: atrial fibrillation  Allergies  Allergen Reactions  . Ezetimibe Other (See Comments)    Myalgia  . Welchol [Colesevelam Hcl] Other (See Comments)    Muscle aches  . Adhesive [Tape] Itching, Rash and Other (See Comments)    Burning   . Ceclor [Cefaclor] Other (See Comments)    Unknown allergic reaction  . Elastic Bandages & [Zinc] Rash and Other (See Comments)    Turns red on the areas it touches  . Latex Itching, Rash and Other (See Comments)    Burning   . Lipitor [Atorvastatin Calcium] Other (See Comments)    Increased fibromyalgia pain  . Mevacor [Lovastatin] Other (See Comments)    Increased fibromyalgia pain  . Pravachol Other (See Comments)    Increased fibromyalgia pain  . Vasotec Other (See Comments)    Unknown allergic reaction    Patient Measurements: Height: 5' 4.5" (163.8 cm) Weight: 153 lb 8 oz (69.6 kg) IBW/kg (Calculated) : 55.85 Heparin Dosing Weight:   Vital Signs: Temp: 97.8 F (36.6 C) (11/23 0020) Temp Source: Oral (11/23 0020) BP: 172/86 (11/23 0020) Pulse Rate: 63 (11/23 0020)  Labs: Recent Labs    01/01/17 1915  HGB 14.6  HCT 44.4  PLT 214  LABPROT 35.3*  INR 3.56  CREATININE 0.88    Estimated Creatinine Clearance: 49.4 mL/min (by C-G formula based on SCr of 0.88 mg/dL).   Medical History: Past Medical History:  Diagnosis Date  . Abnormality of gait 08/02/2014  . Atrial fibrillation ([place])    a. chronic/rate controlled;  b. chronic coumadin.  [name] Cerebrovascular disease 05/21/2016  . Cervical spinal stenosis   . Chronic diastolic CHF (congestive heart failure) ([place])    a. 02/2013 Echo: EF 55-60%, mild AI, mod dil LA.  [name] COPD with emphysema ([place])    PFT 05/02/10>>FEV1 1.35(62%), FEV1% 66, DLCO 75%  . Coronary artery disease    a. s/p CABG;  b. 04/2010 Neg MV  . CVA (cerebral vascular accident) ([place])    left sided weakness  . Dementia   .  Depression   . Fibromyalgia   . GERD (gastroesophageal reflux disease)    pepcid 2-3 times per week  . Gout   . Hemiparesis and alteration of sensations as late effects of stroke ([place]) 01/17/2015  . History of melanoma    squamous cell, melanoma  . Hyperlipidemia   . Hypertension   . Hypothyroidism   . Insomnia   . Intermittent confusion   . Memory change 01/23/2014  . Nasal polyposis   . Osteoarthritis   . Osteoporosis   . Pneumonia    1990    Medications:  Medications Prior to Admission  Medication Sig Dispense Refill Last Dose  . albuterol (PROVENTIL HFA;VENTOLIN HFA) 108 (90 Base) MCG/ACT inhaler Inhale 2 puffs into the lungs every 4 (four) hours as needed for wheezing or shortness of breath. 1 Inhaler 0 unknown  . allopurinol (ZYLOPRIM) 300 MG tablet Take 1 tablet (300 mg total) by mouth every other day. (Patient taking differently: Take 300 mg by mouth daily. ) 45 tablet 3 12/31/2016 at Unknown time  . amitriptyline (ELAVIL) 10 MG tablet TAKE 2 TABLETS BY MOUTH AT BEDTIME (Patient taking differently: TAKE 20mg   BY MOUTH AT BEDTIME) 180 tablet 3 12/31/2016 at Unknown time  . amLODipine (NORVASC) 5 MG tablet TAKE 1 TABLET BY MOUTH AT BEDTIME 90 tablet 1 12/31/2016 at Unknown time  . celecoxib (CELEBREX)  200 MG capsule Take 200 mg by mouth every morning.    01/01/2017 at Unknown time  . citalopram (CELEXA) 20 MG tablet TAKE 1 TABLET BY MOUTH TWICE A DAY 60 tablet 11 01/01/2017 at Unknown time  . clopidogrel (PLAVIX) 75 MG tablet Take 1 tablet (75 mg total) by mouth daily. 90 tablet 2 01/01/2017 at Unknown time  . COUMADIN 5 MG tablet TAKE AS DIRECTED BY COUMADIN CLINIC 135 tablet 1 12/31/2016 at 2200  . digoxin (LANOXIN) 0.125 MG tablet Take 1 tablet (0.125 mg total) by mouth on Mondays, Wednesdays, & Fridays 36 tablet 1 12/31/2016 at Unknown time  . donepezil (ARICEPT) 10 MG tablet Take 1 tablet (10 mg total) by mouth at bedtime. (Patient taking differently: Take 10 mg by mouth  daily. ) 30 tablet 5 01/01/2017 at Unknown time  . ergocalciferol (VITAMIN D2) 50000 UNITS capsule Take 50,000 Units by mouth 2 (two) times a week. Tuesday and Friday   12/30/2016  . furosemide (LASIX) 80 MG tablet Take 1 tablet (80 mg total) by mouth daily. In the AM (Patient taking differently: Take 20 mg by mouth daily. In the AM) 30 tablet 1 01/01/2017 at Unknown time  . KLOR-CON M20 20 MEQ tablet Take 20 mEq by mouth daily.   3 01/01/2017 at Unknown time  . levothyroxine (SYNTHROID, LEVOTHROID) 25 MCG tablet Take 25 mcg by mouth every morning.    01/01/2017 at Unknown time  . losartan (COZAAR) 25 MG tablet Take 1 tablet (25 mg total) by mouth 2 (two) times daily. (Patient taking differently: Take 25 mg by mouth daily. ) 180 tablet 3 01/01/2017 at Unknown time  . memantine (NAMENDA XR) 28 MG CP24 24 hr capsule TAKE ONE CAPSULE BY MOUTH EVERY DAY 90 capsule 3 01/01/2017 at Unknown time  . metoprolol succinate (TOPROL-XL) 25 MG 24 hr tablet TAKE 1 TABLET (25 MG TOTAL) BY MOUTH DAILY. (Patient taking differently: TAKE 1 TABLET (25 MG TOTAL) BY MOUTH DAILY. Morning) 90 tablet 2 01/01/2017 at 1100  . nitroGLYCERIN (NITROSTAT) 0.4 MG SL tablet Place 1 tablet (0.4 mg total) under the tongue every 5 (five) minutes as needed for chest pain. 25 tablet 3 Not Taking  . PROAIR HFA 108 (90 BASE) MCG/ACT inhaler INHALE 2 PUFFS INTO LUNGS EVERY 6 HOURS AS NEEDED FOR WHEEZING (Patient not taking: Reported on 01/01/2017) 8.5 each 0 Not Taking at Unknown time   Scheduled:  . albuterol  2.5 mg Nebulization Q6H  . allopurinol  300 mg Oral Daily  . amitriptyline  20 mg Oral QHS  . amLODipine  5 mg Oral QHS  . citalopram  20 mg Oral BID  . clopidogrel  75 mg Oral Daily  . digoxin  0.125 mg Oral Q M,W,F-1800  . donepezil  10 mg Oral Daily  . furosemide  40 mg Oral Daily  . ipratropium  0.5 mg Nebulization Q6H  . levothyroxine  25 mcg Oral QAC breakfast  . losartan  25 mg Oral Daily  . memantine  28 mg Oral Daily   . methylPREDNISolone (SOLU-MEDROL) injection  60 mg Intravenous Q6H  . metoprolol succinate  25 mg Oral Daily  . potassium chloride SA  20 mEq Oral Daily  . sodium chloride flush  3 mL Intravenous Q12H  . Warfarin - Pharmacist Dosing Inpatient   Does not apply q1800    Assessment: Patient with INR >3.5 on admit.  Last dose warfarin noted 11/21 2200.  No warfarin needed at this time.  Goal of  Therapy:  INR 2-3    Plan:  Daily INR No warfarin at this time  Nani Skillern Crowford 01/02/2017,12:44 AM

## 2017-01-02 NOTE — Progress Notes (Signed)
CRITICAL VALUE ALERT  Critical Value:  INR 4.08  Date & Time Notied:  01/02/17 0705  Provider Notified: Karleen Hampshire   Orders Received/Actions taken:

## 2017-01-02 NOTE — Progress Notes (Signed)
PROGRESS NOTE    Amber [name]  OVZ:[id number] DOB: 05/30/1934 DOA: 01/01/2017 PCP: Antony Contras, MD    Brief Narrative: Amber [name]  is a 82 y.o. female,.. History of atrial fibrillation on chronic anticoagulation with Coumadin, chronic diastolic CHF, EF 41-28%, COPD, CVA, mild dementia, hypertension, depression, hypothyroidism came to hospital with worsening shortness of breath of one day duration.  Her chest x-ray showed left lung opacity suspicious for atelectasis versus pneumonia versus moderate left pleural effusion. She was admitted for further evaluation of acute respiratory failure.      Assessment & Plan:   Active Problems:   Atrial fibrillation (HCC)   Chronic diastolic heart failure (HCC)   Hypothyroidism   Memory change   Acute bronchitis  Acute respiratory failure Probably a component of mild diastolic heart failure/early pneumonia/bronchitis Started patient on Lasix, daily weights, strict intake and output. Continue with IV azithromycin and steroids for bronchitis.  Nasal cannula oxygen to keep sats greater than 90%.  Ambulate in the hallway and check oxygen sats. Ultrasound thoracentesis to aspirate left pleural effusion and fluid send it for analysis to see if its a para pneumonic effusion.   Chronic atrial fibrillation On Coumadin, currently INR is supratherapeutic, hold Coumadin tonight.   Mild dementia No behavioral abnormalities   Hypokalemia Replaced    DVT prophylaxis: scd's Code Status: (full code.  Family Communication: daughter at bedside.  Disposition Plan: pending PT evaluation.   Consultants:   None.    Procedures:  None.    Antimicrobials: azithromycin.    Subjective:  reports feeling better.   Objective: Vitals:   01/02/17 0109 01/02/17 0536 01/02/17 1234 01/02/17 1344  BP:  (!) 147/50 (!) 142/50   Pulse:  62 (!) 54   Resp:  20 12   Temp:  98 F (36.7 C) 97.8 F (36.6 C)   TempSrc:  Oral Oral   SpO2: 96% 98%  95% 95%  Weight:      Height:        Intake/Output Summary (Last 24 hours) at 01/02/2017 1725 Last data filed at 01/02/2017 1500 Gross per 24 hour  Intake 510 ml  Output -  Net 510 ml   Filed Weights   01/01/17 1901 01/02/17 0020  Weight: 68.9 kg (152 lb) 69.6 kg (153 lb 8 oz)    Examination:  General exam: Appears calm and comfortable  Respiratory system: Clear to auscultation. Respiratory effort normal. Cardiovascular system: S1 & S2 heard, RRR. No JVD, murmurs, rubs, gallops or clicks. No pedal edema. Gastrointestinal system: Abdomen is nondistended, soft and nontender. No organomegaly or masses felt. Normal bowel sounds heard. Central nervous system: Alert and oriented. No focal neurological deficits. Extremities: Symmetric 5 x 5 power. Skin: No rashes, lesions or ulcers Psychiatry: Judgement and insight appear normal. Mood & affect appropriate.     Data Reviewed: I have personally reviewed following labs and imaging studies  CBC: Recent Labs  Lab 01/01/17 1915 01/02/17 0518  WBC 9.0 6.3  NEUTROABS 6.9  --   HGB 14.6 12.7  HCT 44.4 38.5  MCV 91.4 90.2  PLT 214 786   Basic Metabolic Panel: Recent Labs  Lab 01/01/17 1915 01/02/17 0518  NA 137 139  K 3.1* 3.8  CL 100* 104  CO2 26 26  GLUCOSE 164* 195*  BUN 9 13  CREATININE 0.88 0.83  CALCIUM 9.3 8.6*   GFR: Estimated Creatinine Clearance: 52.4 mL/min (by C-G formula based on SCr of 0.83 mg/dL). Liver Function Tests: Recent Labs  Lab 01/01/17 1915 01/02/17 0518  AST 29 26  ALT 15 13*  ALKPHOS 70 57  BILITOT 1.5* 1.1  PROT 8.0 6.6  ALBUMIN 4.7 3.9   No results for input(s): LIPASE, AMYLASE in the last 168 hours. No results for input(s): AMMONIA in the last 168 hours. Coagulation Profile: Recent Labs  Lab 01/01/17 1915 01/02/17 0518  INR 3.56 4.08*   Cardiac Enzymes: No results for input(s): CKTOTAL, CKMB, CKMBINDEX, TROPONINI in the last 168 hours. BNP (last 3 results) No results for  input(s): PROBNP in the last 8760 hours. HbA1C: No results for input(s): HGBA1C in the last 72 hours. CBG: No results for input(s): GLUCAP in the last 168 hours. Lipid Profile: No results for input(s): CHOL, HDL, LDLCALC, TRIG, CHOLHDL, LDLDIRECT in the last 72 hours. Thyroid Function Tests: No results for input(s): TSH, T4TOTAL, FREET4, T3FREE, THYROIDAB in the last 72 hours. Anemia Panel: No results for input(s): VITAMINB12, FOLATE, FERRITIN, TIBC, IRON, RETICCTPCT in the last 72 hours. Sepsis Labs: No results for input(s): PROCALCITON, LATICACIDVEN in the last 168 hours.  No results found for this or any previous visit (from the past 240 hour(s)).       Radiology Studies: Dg Chest 2 View  Result Date: 01/01/2017 CLINICAL DATA:  SOB and wheezing x 2 days per pt; hx a fib, COPD, CAD; HTN; PNA, ex smoker EXAM: CHEST  2 VIEW COMPARISON:  07/31/2016 FINDINGS: Small moderate left pleural effusion obscures hemidiaphragm, new since the prior exam. There is associated opacity at the left lung base, most likely atelectasis. Remainder of the lungs is clear. No right pleural effusion. No pneumothorax. Changes from cardiac surgery are stable. Cardiac silhouette is top-normal in size. No mediastinal or hilar masses or convincing adenopathy. Stable compression fractures at the thoracolumbar junction and midthoracic spine are noted. Bones are demineralized. IMPRESSION: 1. Small to moderate left pleural effusion with associated lung base opacity. The lung base opacity may reflect atelectasis or pneumonia, with atelectasis favored. This is new since the prior exam. No pulmonary edema. Electronically Signed   By: Lajean Manes M.D.   On: 01/01/2017 19:23        Scheduled Meds: . allopurinol  300 mg Oral Daily  . amitriptyline  20 mg Oral QHS  . amLODipine  5 mg Oral QHS  . citalopram  20 mg Oral BID  . clopidogrel  75 mg Oral Daily  . digoxin  0.125 mg Oral Q M,W,F-1800  . donepezil  10 mg Oral  Daily  . furosemide  40 mg Oral Daily  . ipratropium-albuterol  3 mL Nebulization Q6H  . levothyroxine  25 mcg Oral QAC breakfast  . losartan  25 mg Oral Daily  . memantine  28 mg Oral Daily  . metoprolol succinate  25 mg Oral Daily  . potassium chloride SA  20 mEq Oral Daily  . [START ON 01/03/2017] predniSONE  40 mg Oral QAC breakfast  . sodium chloride flush  3 mL Intravenous Q12H  . Warfarin - Pharmacist Dosing Inpatient   Does not apply q1800   Continuous Infusions: . sodium chloride    . azithromycin Stopped (01/02/17 1214)     LOS: 0 days    Time spent: 39 min    Hosie Poisson, MD [facility] Pager [phone]  If 7PM-7AM, please contact night-coverage [email or web address] Password [email or web address] 01/02/2017, 5:25 PM

## 2017-01-02 NOTE — Care Management Obs Status (Signed)
[place] NOTIFICATION   Patient Details  Name: Amber [name] MRN: [id number] Date of Birth: September 12, 1934   Medicare Observation Status Notification Given:  Yes    MahabirJuliann Pulse, RN 01/02/2017, 2:15 PM

## 2017-01-02 NOTE — Progress Notes (Signed)
  Echocardiogram 2D Echocardiogram with definity has been performed.  Amber [name] M 01/02/2017, 8:58 AM

## 2017-01-02 NOTE — Progress Notes (Signed)
ANTICOAGULATION CONSULT NOTE - Follow Up Consult  Pharmacy Consult for warfarin Indication: hx atrial fibrillation  Allergies  Allergen Reactions  . Ezetimibe Other (See Comments)    Myalgia  . Welchol [Colesevelam Hcl] Other (See Comments)    Muscle aches  . Adhesive [Tape] Itching, Rash and Other (See Comments)    Burning   . Ceclor [Cefaclor] Other (See Comments)    Unknown allergic reaction  . Elastic Bandages & [Zinc] Rash and Other (See Comments)    Turns red on the areas it touches  . Latex Itching, Rash and Other (See Comments)    Burning   . Lipitor [Atorvastatin Calcium] Other (See Comments)    Increased fibromyalgia pain  . Mevacor [Lovastatin] Other (See Comments)    Increased fibromyalgia pain  . Pravachol Other (See Comments)    Increased fibromyalgia pain  . Vasotec Other (See Comments)    Unknown allergic reaction    Patient Measurements: Height: 5' 4.5" (163.8 cm) Weight: 153 lb 8 oz (69.6 kg) IBW/kg (Calculated) : 55.85 Heparin Dosing Weight:   Vital Signs: Temp: 98 F (36.7 C) (11/23 0536) Temp Source: Oral (11/23 0536) BP: 147/50 (11/23 0536) Pulse Rate: 62 (11/23 0536)  Labs: Recent Labs    01/01/17 1915 01/02/17 0518  HGB 14.6 12.7  HCT 44.4 38.5  PLT 214 188  LABPROT 35.3* 39.3*  INR 3.56 4.08*  CREATININE 0.88 0.83    Estimated Creatinine Clearance: 52.4 mL/min (by C-G formula based on SCr of 0.83 mg/dL).   Medications:  PTA warfarin regimen: 5mg  daily except 7.5 mg on Tue and sat (per outpatient [facility] clinic note on 11/01) -- last dose taken PTA was on 11/21   Assessment: Patient is an 82 y.o F with hx afib and CVA on warfarin PTA, presented to the ED on 01/01/17 with c/o chest pain and SOB. To resume warfarin while patient is hospitalized.  Today, 01/02/2017: - INR is supratherapeutic at 4.08 - cbc ok - drug-drug intxns: being on abx can make pt more sensitive to warfarin (on azithromycin)  Goal of Therapy:  INR  2-3 Monitor platelets by anticoagulation protocol: Yes   Plan:  - continue to hold warfarin - f/u with AM labs and resume when appropriate - monitor for s/s bleeding  Lewie Deman P 01/02/2017,7:58 AM

## 2017-01-02 NOTE — Progress Notes (Signed)
Called by RN, that patient's daughter wanted to be full code. We will switch CODE STATUS to full code.

## 2017-01-02 NOTE — Care Management Note (Signed)
Case Management Note  Patient Details  Name: Amber [name] MRN: [id number] Date of Birth: 12-09-1934  Subjective/Objective:82 y/o f admitted w/Acute bronchitis. From home. Per nsg no PT cons needed @ this time.                    Action/Plan:d/c plan home.   Expected Discharge Date:                  Expected Discharge Plan:  Home/Self Care  In-House Referral:     Discharge planning Services  CM Consult  Post Acute Care Choice:    Choice offered to:     DME Arranged:    DME Agency:     HH Arranged:    HH Agency:     Status of Service:     If discussed at [employer] of Stay Meetings, dates discussed:    Additional Comments:  Dessa Phi, RN 01/02/2017, 9:41 AM

## 2017-01-03 ENCOUNTER — Observation Stay (HOSPITAL_COMMUNITY): Payer: Medicare Other

## 2017-01-03 DIAGNOSIS — J441 Chronic obstructive pulmonary disease with (acute) exacerbation: Secondary | ICD-10-CM | POA: Diagnosis not present

## 2017-01-03 DIAGNOSIS — E039 Hypothyroidism, unspecified: Secondary | ICD-10-CM | POA: Diagnosis not present

## 2017-01-03 DIAGNOSIS — I5033 Acute on chronic diastolic (congestive) heart failure: Secondary | ICD-10-CM | POA: Diagnosis not present

## 2017-01-03 LAB — PROTIME-INR
INR: 5.46
PROTHROMBIN TIME: 49.3 s — AB (ref 11.4–15.2)

## 2017-01-03 MED ORDER — FUROSEMIDE 40 MG PO TABS: 40.0000 mg | ORAL_TABLET | Freq: Every day | ORAL | 0 refills | Status: DC

## 2017-01-03 MED ORDER — AZITHROMYCIN 500 MG PO TABS: 500.0000 mg | ORAL_TABLET | Freq: Every day | ORAL | 0 refills | Status: AC

## 2017-01-03 MED ORDER — COUMADIN 5 MG PO TABS: ORAL_TABLET | ORAL | 1 refills | Status: DC

## 2017-01-03 MED ORDER — PHYTONADIONE 5 MG PO TABS
2.5000 mg | ORAL_TABLET | Freq: Once | ORAL | Status: AC
  Administered 2017-01-03: 2.5 mg via ORAL
  Filled 2017-01-03: qty 1

## 2017-01-03 MED ORDER — PREDNISONE 20 MG PO TABS: ORAL_TABLET | ORAL | 0 refills | Status: DC

## 2017-01-03 NOTE — Progress Notes (Signed)
INR 5.46. MD made aware on floor.

## 2017-01-03 NOTE — Discharge Summary (Signed)
Physician Discharge Summary  Amber [name] EHM:[id number] DOB: April 23, 1934 DOA: 01/01/2017  PCP: Antony Contras, MD  Admit date: 01/01/2017 Discharge date: 01/03/2017  Admitted From: Home Disposition:  HOme  Recommendations for Outpatient Follow-up:  1. Follow up with PCP in 1-2 weeks 2. Please obtain BMP/CBC in one week 3. Please follow up with Dr Tamala Julian in one to two weeks.   Home Health: yes.   Discharge Condition:stable.  CODE STATUS: full code.  Diet recommendation: Heart Healthy  Brief/Interim Summary: [name] a82 y.o.female,..History of atrial fibrillation on chronic anticoagulation with Coumadin, chronic diastolic CHF, EF 36-62%, COPD, CVA, mild dementia, hypertension, depression, hypothyroidism came to hospital with worsening shortness of breath of one day duration.  Her chest x-ray showed left lung opacity suspicious for atelectasis versus pneumonia versus moderate left pleural effusion. She was admitted for further evaluation of acute respiratory failure.    Discharge Diagnoses:  Active Problems:   Atrial fibrillation (HCC)   Chronic diastolic heart failure (HCC)   Hypothyroidism   Memory change   Acute bronchitis  Acute respiratory failure Probably a component of mild diastolic heart failure/early pneumonia/bronchitis Started patient on Lasix, daily weights, strict intake and output. Started on  IV azithromycin and steroids for bronchitis.  Transitioned to oral azithromycin and quick taper of steroids for bronchitis.  Increased lasix from 20 mg daily to 40 mg daily and follo wup with Dr Tamala Julian on discharge in one week.    Chronic atrial fibrillation On Coumadin, currently INR is supratherapeutic, hold Coumadin tonight.give a small dose of vitamin K and recheck INR on Monday  By [facility].    Mild dementia No behavioral abnormalities   Hypokalemia Replaced    Discharge Instructions  Discharge Instructions    Diet - low sodium heart  healthy   Complete by:  As directed    Discharge instructions   Complete by:  As directed    Please follow up with PCP in one week.  Please check INR on Monday by [facility] and dose coumadin as per the INR.     Allergies as of 01/03/2017      Reactions   Ezetimibe Other (See Comments)   Myalgia   Welchol [colesevelam Hcl] Other (See Comments)   Muscle aches   Adhesive [tape] Itching, Rash, Other (See Comments)   Burning    Ceclor [cefaclor] Other (See Comments)   Unknown allergic reaction   Elastic Bandages & [zinc] Rash, Other (See Comments)   Turns red on the areas it touches   Latex Itching, Rash, Other (See Comments)   Burning    Lipitor [atorvastatin Calcium] Other (See Comments)   Increased fibromyalgia pain   Mevacor [lovastatin] Other (See Comments)   Increased fibromyalgia pain   Pravachol Other (See Comments)   Increased fibromyalgia pain   Vasotec Other (See Comments)   Unknown allergic reaction      Medication List    TAKE these medications   allopurinol 300 MG tablet Commonly known as:  ZYLOPRIM Take 1 tablet (300 mg total) by mouth every other day. What changed:  when to take this   amitriptyline 10 MG tablet Commonly known as:  ELAVIL TAKE 2 TABLETS BY MOUTH AT BEDTIME What changed:    how much to take  how to take this  when to take this   amLODipine 5 MG tablet Commonly known as:  NORVASC TAKE 1 TABLET BY MOUTH AT BEDTIME   celecoxib 200 MG capsule Commonly known as:  CELEBREX Take 200 mg by  mouth every morning.   citalopram 20 MG tablet Commonly known as:  CELEXA TAKE 1 TABLET BY MOUTH TWICE A DAY   clopidogrel 75 MG tablet Commonly known as:  PLAVIX Take 1 tablet (75 mg total) by mouth daily.   COUMADIN 5 MG tablet Generic drug:  warfarin Take as directed. If you are unsure how to take this medication, talk to your nurse or doctor. Original instructions:  TAKE AS DIRECTED BY COUMADIN CLINIC. Hold coumadin November 24 th and November  25 th.  Recheck INR on Monday. What changed:  See the new instructions.   digoxin 0.125 MG tablet Commonly known as:  LANOXIN Take 1 tablet (0.125 mg total) by mouth on Mondays, Wednesdays, & Fridays   donepezil 10 MG tablet Commonly known as:  ARICEPT Take 1 tablet (10 mg total) by mouth at bedtime. What changed:  when to take this   ergocalciferol 50000 units capsule Commonly known as:  VITAMIN D2 Take 50,000 Units by mouth 2 (two) times a week. Tuesday and Friday   furosemide 40 MG tablet Commonly known as:  LASIX Take 1 tablet (40 mg total) by mouth daily. Start taking on:  01/04/2017 What changed:    medication strength  how much to take  additional instructions   KLOR-CON M20 20 MEQ tablet Generic drug:  potassium chloride SA Take 20 mEq by mouth daily.   levothyroxine 25 MCG tablet Commonly known as:  SYNTHROID, LEVOTHROID Take 25 mcg by mouth every morning.   losartan 25 MG tablet Commonly known as:  COZAAR Take 1 tablet (25 mg total) by mouth 2 (two) times daily. What changed:  when to take this   memantine 28 MG Cp24 24 hr capsule Commonly known as:  NAMENDA XR TAKE ONE CAPSULE BY MOUTH EVERY DAY   metoprolol succinate 25 MG 24 hr tablet Commonly known as:  TOPROL-XL TAKE 1 TABLET (25 MG TOTAL) BY MOUTH DAILY. What changed:  See the new instructions.   nitroGLYCERIN 0.4 MG SL tablet Commonly known as:  NITROSTAT Place 1 tablet (0.4 mg total) under the tongue every 5 (five) minutes as needed for chest pain.   predniSONE 20 MG tablet Commonly known as:  DELTASONE Prednisone 20 mg daily for 3 days and stop. Start taking on:  01/04/2017   PROAIR HFA 108 (90 Base) MCG/ACT inhaler Generic drug:  albuterol INHALE 2 PUFFS INTO LUNGS EVERY 6 HOURS AS NEEDED FOR WHEEZING   albuterol 108 (90 Base) MCG/ACT inhaler Commonly known as:  PROVENTIL HFA;VENTOLIN HFA Inhale 2 puffs into the lungs every 4 (four) hours as needed for wheezing or shortness of  breath.      Follow-up Information    Antony Contras, MD. Schedule an appointment as soon as possible for a visit in 1 week(s).   Specialty:  Family Medicine Contact information: [address], [place] [address] [phone]        Belva Crome, MD. Schedule an appointment as soon as possible for a visit in 1 week(s).   Specialty:  Cardiology Why:  FOLLOW UP FOR CHF.  Contact information: [address] [place] [address] [phone]          Allergies  Allergen Reactions  . Ezetimibe Other (See Comments)    Myalgia  . Welchol [Colesevelam Hcl] Other (See Comments)    Muscle aches  . Adhesive [Tape] Itching, Rash and Other (See Comments)    Burning   . Ceclor [Cefaclor] Other (  See Comments)    Unknown allergic reaction  . Elastic Bandages & [Zinc] Rash and Other (See Comments)    Turns red on the areas it touches  . Latex Itching, Rash and Other (See Comments)    Burning   . Lipitor [Atorvastatin Calcium] Other (See Comments)    Increased fibromyalgia pain  . Mevacor [Lovastatin] Other (See Comments)    Increased fibromyalgia pain  . Pravachol Other (See Comments)    Increased fibromyalgia pain  . Vasotec Other (See Comments)    Unknown allergic reaction    Consultations:  None.   Procedures/Studies: Dg Chest 2 View  Result Date: 01/03/2017 CLINICAL DATA:  Shortness of breath. History of left pleural effusion. EXAM: CHEST  2 VIEW COMPARISON:  PA and lateral chest 08/08/2016 and 01/01/2017. FINDINGS: Small left pleural effusion has decreased since the most recent examination. Smaller right pleural effusion is not notably changed. There is associated basilar atelectasis on the right and left. The patient is status post CABG. Heart size is upper normal. No pneumothorax. Aortic atherosclerosis noted. IMPRESSION: Small bilateral pleural effusions, larger on the left, with associated basilar atelectasis. Left effusion has  decreased since the most recent exam. No new abnormality. Electronically Signed   By: Inge Rise M.D.   On: 01/03/2017 13:58   Dg Chest 2 View  Result Date: 01/01/2017 CLINICAL DATA:  SOB and wheezing x 2 days per pt; hx a fib, COPD, CAD; HTN; PNA, ex smoker EXAM: CHEST  2 VIEW COMPARISON:  07/31/2016 FINDINGS: Small moderate left pleural effusion obscures hemidiaphragm, new since the prior exam. There is associated opacity at the left lung base, most likely atelectasis. Remainder of the lungs is clear. No right pleural effusion. No pneumothorax. Changes from cardiac surgery are stable. Cardiac silhouette is top-normal in size. No mediastinal or hilar masses or convincing adenopathy. Stable compression fractures at the thoracolumbar junction and midthoracic spine are noted. Bones are demineralized. IMPRESSION: 1. Small to moderate left pleural effusion with associated lung base opacity. The lung base opacity may reflect atelectasis or pneumonia, with atelectasis favored. This is new since the prior exam. No pulmonary edema. Electronically Signed   By: Lajean Manes M.D.   On: 01/01/2017 19:23       Subjective: No new complaints.   Discharge Exam: Vitals:   01/03/17 0837 01/03/17 1350  BP:    Pulse:    Resp:    Temp:    SpO2: 96% 93%   Vitals:   01/02/17 2156 01/03/17 0547 01/03/17 0837 01/03/17 1350  BP: (!) 153/68 (!) 151/46    Pulse: 74 62    Resp: 18 18    Temp: 98.2 F (36.8 C) 98 F (36.7 C)    TempSrc: Oral Oral    SpO2: 94% 95% 96% 93%  Weight:      Height:        General: Pt is alert, awake, not in acute distress Cardiovascular: RRR, S1/S2 +, no rubs, no gallops Respiratory: CTA bilaterally, no wheezing, no rhonchi Abdominal: Soft, NT, ND, bowel sounds + Extremities: no edema, no cyanosis    The results of significant diagnostics from this hospitalization (including imaging, microbiology, ancillary and laboratory) are listed below for reference.      Microbiology: No results found for this or any previous visit (from the past 240 hour(s)).   Labs: BNP (last 3 results) Recent Labs    07/31/16 1646 01/01/17 2207  BNP 213.2* 751.0*   Basic Metabolic Panel: Recent Labs  Lab 01/01/17 1915 01/02/17 0518  NA 137 139  K 3.1* 3.8  CL 100* 104  CO2 26 26  GLUCOSE 164* 195*  BUN 9 13  CREATININE 0.88 0.83  CALCIUM 9.3 8.6*   Liver Function Tests: Recent Labs  Lab 01/01/17 1915 01/02/17 0518  AST 29 26  ALT 15 13*  ALKPHOS 70 57  BILITOT 1.5* 1.1  PROT 8.0 6.6  ALBUMIN 4.7 3.9   No results for input(s): LIPASE, AMYLASE in the last 168 hours. No results for input(s): AMMONIA in the last 168 hours. CBC: Recent Labs  Lab 01/01/17 1915 01/02/17 0518  WBC 9.0 6.3  NEUTROABS 6.9  --   HGB 14.6 12.7  HCT 44.4 38.5  MCV 91.4 90.2  PLT 214 188   Cardiac Enzymes: No results for input(s): CKTOTAL, CKMB, CKMBINDEX, TROPONINI in the last 168 hours. BNP: Invalid input(s): POCBNP CBG: No results for input(s): GLUCAP in the last 168 hours. D-Dimer No results for input(s): DDIMER in the last 72 hours. Hgb A1c No results for input(s): HGBA1C in the last 72 hours. Lipid Profile No results for input(s): CHOL, HDL, LDLCALC, TRIG, CHOLHDL, LDLDIRECT in the last 72 hours. Thyroid function studies No results for input(s): TSH, T4TOTAL, T3FREE, THYROIDAB in the last 72 hours.  Invalid input(s): FREET3 Anemia work up No results for input(s): VITAMINB12, FOLATE, FERRITIN, TIBC, IRON, RETICCTPCT in the last 72 hours. Urinalysis    Component Value Date/Time   COLORURINE YELLOW 07/31/2016 2106   APPEARANCEUR HAZY (A) 07/31/2016 2106   LABSPEC 1.012 07/31/2016 2106   PHURINE 5.0 07/31/2016 2106   GLUCOSEU NEGATIVE 07/31/2016 2106   GLUCOSEU NEGATIVE 02/28/2013 1235   HGBUR SMALL (A) 07/31/2016 2106   BILIRUBINUR NEGATIVE 07/31/2016 2106   KETONESUR NEGATIVE 07/31/2016 2106   PROTEINUR NEGATIVE 07/31/2016 2106    UROBILINOGEN 0.2 02/28/2013 1235   NITRITE NEGATIVE 07/31/2016 2106   LEUKOCYTESUR LARGE (A) 07/31/2016 2106   Sepsis Labs Invalid input(s): PROCALCITONIN,  WBC,  LACTICIDVEN Microbiology No results found for this or any previous visit (from the past 240 hour(s)).   Time coordinating discharge: Over 30 minutes  SIGNED:   Hosie Poisson, MD  [facility] 01/03/2017, 2:10 PM Pager   If 7PM-7AM, please contact night-coverage [email or web address] Password [email or web address]

## 2017-01-03 NOTE — Progress Notes (Signed)
ANTICOAGULATION CONSULT NOTE - Follow Up Consult  Pharmacy Consult for warfarin Indication: hx atrial fibrillation  Allergies  Allergen Reactions  . Ezetimibe Other (See Comments)    Myalgia  . Welchol [Colesevelam Hcl] Other (See Comments)    Muscle aches  . Adhesive [Tape] Itching, Rash and Other (See Comments)    Burning   . Ceclor [Cefaclor] Other (See Comments)    Unknown allergic reaction  . Elastic Bandages & [Zinc] Rash and Other (See Comments)    Turns red on the areas it touches  . Latex Itching, Rash and Other (See Comments)    Burning   . Lipitor [Atorvastatin Calcium] Other (See Comments)    Increased fibromyalgia pain  . Mevacor [Lovastatin] Other (See Comments)    Increased fibromyalgia pain  . Pravachol Other (See Comments)    Increased fibromyalgia pain  . Vasotec Other (See Comments)    Unknown allergic reaction    Patient Measurements: Height: 5' 4.5" (163.8 cm) Weight: 153 lb 8 oz (69.6 kg) IBW/kg (Calculated) : 55.85 Heparin Dosing Weight:   Vital Signs: Temp: 98 F (36.7 C) (11/24 0547) Temp Source: Oral (11/24 0547) BP: 151/46 (11/24 0547) Pulse Rate: 62 (11/24 0547)  Labs: Recent Labs    01/01/17 1915 01/02/17 0518 01/03/17 0606  HGB 14.6 12.7  --   HCT 44.4 38.5  --   PLT 214 188  --   LABPROT 35.3* 39.3* 49.3*  INR 3.56 4.08* 5.46*  CREATININE 0.88 0.83  --     Estimated Creatinine Clearance: 52.4 mL/min (by C-G formula based on SCr of 0.83 mg/dL).   Medications:  PTA warfarin regimen: 5mg  daily except 7.5 mg on Tue and sat (per outpatient [facility] clinic note on 11/01) -- last dose taken PTA was on 11/21   Assessment: Patient is an 82 y.o F with hx afib and CVA on warfarin PTA, presented to the ED on 01/01/17 with c/o chest pain and SOB. To resume warfarin while patient is hospitalized.  Today, 01/03/2017: - INR is supratherapeutic and trending up to 5.46 despite holding warfarin since admission - cbc ok - no bleeding  documented - drug-drug intxns: being on abx can make pt more sensitive to warfarin (on azithromycin)  Goal of Therapy:  INR 2-3 Monitor platelets by anticoagulation protocol: Yes   Plan:  - continue to hold warfarin - f/u with AM labs and resume when appropriate - monitor for s/s bleeding  Wilfredo Canterbury P 01/03/2017,9:15 AM

## 2017-01-03 NOTE — Care Management Note (Signed)
Case Management Note  Patient Details  Name: FRENCHIE PRIBYL MRN: [id number] Date of Birth: 01-28-1935  Subjective/Objective:  Acute resp failure, CHF, left pleural effusion                   Action/Plan: Discharge Planning: NCM spoke to pt and dtr. Pt lives at home with husband. Dtr lives next door. Pt has RW and bedside commode at home. Offered choice for [facility]. Pt reports having [facility] in the past. [facility] with new referral with INR needed on Monday. Attending updated [place] order with INR check needed.   PCP Moreen Fowler, DAVID MD  Expected Discharge Date:  01/03/17               Expected Discharge Plan:  [place]  In-House Referral:  NA  Discharge planning Services  CM Consult  Post Acute Care Choice:  Home Health Choice offered to:  Patient, Adult Children  DME Arranged:  N/A DME Agency:  NA  HH Arranged:  RN, PT [place] Agency:  [place]  Status of Service:  Completed, signed off  If discussed at [place] of Stay Meetings, dates discussed:    Additional Comments:  Erenest Rasher, RN 01/03/2017, 4:37 PM

## 2017-01-05 ENCOUNTER — Ambulatory Visit (INDEPENDENT_AMBULATORY_CARE_PROVIDER_SITE_OTHER): Payer: Medicare Other | Admitting: Internal Medicine

## 2017-01-05 DIAGNOSIS — I4891 Unspecified atrial fibrillation: Secondary | ICD-10-CM | POA: Diagnosis not present

## 2017-01-05 DIAGNOSIS — Z5181 Encounter for therapeutic drug level monitoring: Secondary | ICD-10-CM | POA: Diagnosis not present

## 2017-01-05 LAB — POCT INR: INR: 1.4

## 2017-01-06 ENCOUNTER — Telehealth: Payer: Self-pay | Admitting: Interventional Cardiology

## 2017-01-06 NOTE — Telephone Encounter (Signed)
Spoke with Verdis Frederickson at [facility].  Pt was initiated into their care d/t recent hospitalization of PNA.  They will have visits with pt x 3 weeks to monitor PNA and PT/INR.  Verdis Frederickson states that they have been having issues with a [place] diuretic protocol popping up in pt's charts that have HF so they are having to contact the MDs offices to let them know.  She states pt is not currently having any HF sx and is doing great.  They will monitor for HF sx as well and contact us should any arise.  Advised this would be great and to let us know if we are needed.  Verdis Frederickson appreciative for call.

## 2017-01-06 NOTE — Telephone Encounter (Signed)
New message   Verdis Frederickson from [employer] [phone] Has questions regarding a diuretic protocol on patient chart. Please call

## 2017-01-07 LAB — CULTURE, BLOOD (ROUTINE X 2)
CULTURE: NO GROWTH
Culture: NO GROWTH
SPECIAL REQUESTS: ADEQUATE
Special Requests: ADEQUATE

## 2017-01-09 ENCOUNTER — Ambulatory Visit (INDEPENDENT_AMBULATORY_CARE_PROVIDER_SITE_OTHER): Payer: Medicare Other | Admitting: Internal Medicine

## 2017-01-09 DIAGNOSIS — I4891 Unspecified atrial fibrillation: Secondary | ICD-10-CM | POA: Diagnosis not present

## 2017-01-09 DIAGNOSIS — Z5181 Encounter for therapeutic drug level monitoring: Secondary | ICD-10-CM | POA: Diagnosis not present

## 2017-01-09 LAB — POCT INR: INR: 1.7

## 2017-01-09 NOTE — Patient Instructions (Signed)
Spoke with [facility] RN instructed to have pt take 1.5 tablets today then continue taking 1 tablet daily except 1.5 tablets on Tuesdays and Saturdays. Continue dark leafy greens servings 2 times a week.  Recheck INR in one week.  Call with any questions or problems [phone]

## 2017-01-10 ENCOUNTER — Other Ambulatory Visit: Payer: Self-pay | Admitting: Interventional Cardiology

## 2017-01-14 ENCOUNTER — Other Ambulatory Visit: Payer: Self-pay | Admitting: Interventional Cardiology

## 2017-01-16 ENCOUNTER — Ambulatory Visit (INDEPENDENT_AMBULATORY_CARE_PROVIDER_SITE_OTHER): Payer: Medicare Other | Admitting: Cardiovascular Disease

## 2017-01-16 DIAGNOSIS — I4891 Unspecified atrial fibrillation: Secondary | ICD-10-CM

## 2017-01-16 DIAGNOSIS — Z5181 Encounter for therapeutic drug level monitoring: Secondary | ICD-10-CM | POA: Diagnosis not present

## 2017-01-16 LAB — POCT INR: INR: 2

## 2017-01-23 ENCOUNTER — Encounter: Payer: Self-pay | Admitting: Interventional Cardiology

## 2017-01-23 ENCOUNTER — Ambulatory Visit (INDEPENDENT_AMBULATORY_CARE_PROVIDER_SITE_OTHER): Payer: Medicare Other | Admitting: Cardiology

## 2017-01-23 ENCOUNTER — Ambulatory Visit: Payer: Medicare Other | Admitting: Interventional Cardiology

## 2017-01-23 VITALS — BP 162/64 | HR 63 | Ht 66.0 in | Wt 155.0 lb

## 2017-01-23 DIAGNOSIS — J439 Emphysema, unspecified: Secondary | ICD-10-CM | POA: Diagnosis not present

## 2017-01-23 DIAGNOSIS — I5032 Chronic diastolic (congestive) heart failure: Secondary | ICD-10-CM

## 2017-01-23 DIAGNOSIS — I130 Hypertensive heart and chronic kidney disease with heart failure and stage 1 through stage 4 chronic kidney disease, or unspecified chronic kidney disease: Secondary | ICD-10-CM | POA: Diagnosis not present

## 2017-01-23 DIAGNOSIS — I25709 Atherosclerosis of coronary artery bypass graft(s), unspecified, with unspecified angina pectoris: Secondary | ICD-10-CM

## 2017-01-23 DIAGNOSIS — Z5181 Encounter for therapeutic drug level monitoring: Secondary | ICD-10-CM

## 2017-01-23 DIAGNOSIS — I4891 Unspecified atrial fibrillation: Secondary | ICD-10-CM

## 2017-01-23 LAB — BASIC METABOLIC PANEL
BUN / CREAT RATIO: 14 (ref 12–28)
BUN: 12 mg/dL (ref 8–27)
CO2: 30 mmol/L — ABNORMAL HIGH (ref 20–29)
CREATININE: 0.88 mg/dL (ref 0.57–1.00)
Calcium: 9.3 mg/dL (ref 8.7–10.3)
Chloride: 100 mmol/L (ref 96–106)
GFR calc Af Amer: 72 mL/min/{1.73_m2} (ref >59)
GFR, EST NON AFRICAN AMERICAN: 62 mL/min/{1.73_m2} (ref >59)
Glucose: 107 mg/dL — ABNORMAL HIGH (ref 65–99)
Potassium: 3.4 mmol/L — ABNORMAL LOW (ref 3.5–5.2)
SODIUM: 146 mmol/L — AB (ref 134–144)

## 2017-01-23 LAB — POCT INR: INR: 2.2

## 2017-01-23 LAB — PRO B NATRIURETIC PEPTIDE: NT-Pro BNP: 2015 pg/mL — ABNORMAL HIGH (ref 0–738)

## 2017-01-23 NOTE — Progress Notes (Signed)
Cardiology Office Note    Date:  01/23/2017   ID:  Amber, [name] 29-May-1934, MRN [id number]  PCP:  Antony Contras, MD  Cardiologist: Sinclair Grooms, MD   Chief Complaint  Patient presents with  . Atrial Fibrillation    History of Present Illness:  Amber [name] is a 81 y.o. female  with a history of coronary artery disease with prior CABG in 2000, chronic atrial fibrillation, chronic diastolic heart failure, chronic kidney disease, stage III, hyperlipidemia (statin intolerant), and essential hypertension with hypertensive heart disease. Also has cerebrovascular disease and dementia.   Hospital stay for acute diastolic heart failure and possible upper respiratory infection.  Improved dramatically with diuresis.  Furosemide dose was increased back to 40 mg/day from 20.  Earlier this year primary care had decreased furosemide because of concerns about dehydration.  The patient feels well today.  She remains on the 40 mg of furosemide.  We spent time discussing diastolic heart failure.   Past Medical History:  Diagnosis Date  . Abnormality of gait 08/02/2014  . Atrial fibrillation ([place])    a. chronic/rate controlled;  b. chronic coumadin.  [name] Cerebrovascular disease 05/21/2016  . Cervical spinal stenosis   . Chronic diastolic CHF (congestive heart failure) ([place])    a. 02/2013 Echo: EF 55-60%, mild AI, mod dil LA.  [name] COPD with emphysema ([place])    PFT 05/02/10>>FEV1 1.35(62%), FEV1% 66, DLCO 75%  . Coronary artery disease    a. s/p CABG;  b. 04/2010 Neg MV  . CVA (cerebral vascular accident) ([place])    left sided weakness  . Dementia   . Depression   . Fibromyalgia   . GERD (gastroesophageal reflux disease)    pepcid 2-3 times per week  . Gout   . Hemiparesis and alteration of sensations as late effects of stroke ([place]) 01/17/2015  . History of melanoma    squamous cell, melanoma  . Hyperlipidemia   . Hypertension   . Hypothyroidism   . Insomnia   . Intermittent confusion    . Memory change 01/23/2014  . Nasal polyposis   . Osteoarthritis   . Osteoporosis   . Pneumonia    1990    Past Surgical History:  Procedure Laterality Date  . ABDOMINAL HYSTERECTOMY    . BREAST LUMPECTOMY  1980s   Benign lesion - right  . carpel tunnel     right  . CORONARY ARTERY BYPASS GRAFT  2000  . EYE SURGERY     bilateral cataracts  . IR GENERIC HISTORICAL  03/31/2016   IR RADIOLOGIST EVAL & MGMT 03/31/2016 [facility]  . RADIOLOGY WITH ANESTHESIA  12/24/2011   Procedure: RADIOLOGY WITH ANESTHESIA;  Surgeon: Medication Radiologist, MD;  Location: [facility];  Service: Radiology;  Laterality: N/A;  Extra Cranial Vascular Stent  . RADIOLOGY WITH ANESTHESIA N/A 07/19/2014   Procedure: ANGIOPLASTY;  Surgeon: Luanne Bras, MD;  Location: [facility];  Service: Radiology;  Laterality: N/A;  . RADIOLOGY WITH ANESTHESIA N/A 07/27/2014   Procedure: ANGIOPLASTY;  Surgeon: Luanne Bras, MD;  Location: [facility];  Service: Radiology;  Laterality: N/A;  . TONSILLECTOMY      Current Medications: Outpatient Medications Prior to Visit  Medication Sig Dispense Refill  . albuterol (PROVENTIL HFA;VENTOLIN HFA) 108 (90 Base) MCG/ACT inhaler Inhale 2 puffs into the lungs every 4 (four) hours as needed for wheezing or shortness of breath. 1 Inhaler 0  . allopurinol (ZYLOPRIM) 300 MG tablet Take 1 tablet (300 mg  total) by mouth every other day. (Patient taking differently: Take 300 mg by mouth daily. ) 45 tablet 3  . amitriptyline (ELAVIL) 10 MG tablet TAKE 2 TABLETS BY MOUTH AT BEDTIME (Patient taking differently: TAKE 71m  BY MOUTH AT BEDTIME) 180 tablet 3  . amLODipine (NORVASC) 5 MG tablet Take 1 tablet (5 mg total) by mouth at bedtime. 90 tablet 0  . celecoxib (CELEBREX) 200 MG capsule Take 200 mg by mouth every morning.     . citalopram (CELEXA) 20 MG tablet TAKE 1 TABLET BY MOUTH TWICE A DAY 60 tablet 11  . clopidogrel (PLAVIX) 75 MG tablet Take 1 tablet (75 mg total) by mouth daily. 90  tablet 2  . COUMADIN 5 MG tablet TAKE AS DIRECTED BY COUMADIN CLINIC. Hold coumadin November 24 th and November 25 th.  Recheck INR on Monday. 135 tablet 1  . digoxin (LANOXIN) 0.125 MG tablet Take 1 tablet (0.125 mg total) by mouth on Mondays, Wednesdays, & Fridays 36 tablet 1  . donepezil (ARICEPT) 10 MG tablet Take 1 tablet (10 mg total) by mouth at bedtime. (Patient taking differently: Take 10 mg by mouth daily. ) 30 tablet 5  . ergocalciferol (VITAMIN D2) 50000 UNITS capsule Take 50,000 Units by mouth 2 (two) times a week. Tuesday and Friday    . furosemide (LASIX) 40 MG tablet Take 1 tablet (40 mg total) by mouth daily. 30 tablet 0  . KLOR-CON M20 20 MEQ tablet Take 20 mEq by mouth daily.   3  . levothyroxine (SYNTHROID, LEVOTHROID) 25 MCG tablet Take 25 mcg by mouth every morning.     .Marland Kitchenlosartan (COZAAR) 25 MG tablet Take 25 mg by mouth daily.  3  . memantine (NAMENDA XR) 28 MG CP24 24 hr capsule TAKE ONE CAPSULE BY MOUTH EVERY DAY 90 capsule 3  . metoprolol succinate (TOPROL-XL) 25 MG 24 hr tablet TAKE 1 TABLET (25 MG TOTAL) BY MOUTH DAILY. (Patient taking differently: TAKE 1 TABLET (25 MG TOTAL) BY MOUTH DAILY. Morning) 90 tablet 2  . nitroGLYCERIN (NITROSTAT) 0.4 MG SL tablet Place 1 tablet (0.4 mg total) under the tongue every 5 (five) minutes as needed for chest pain. 25 tablet 3  . PROAIR HFA 108 (90 BASE) MCG/ACT inhaler INHALE 2 PUFFS INTO LUNGS EVERY 6 HOURS AS NEEDED FOR WHEEZING 8.5 each 0  . amLODipine (NORVASC) 5 MG tablet Take 1 tablet (5 mg total) by mouth at bedtime. Please make yearly appt with Dr. STamala Julianfor March. 1st attempt (Patient not taking: Reported on 01/23/2017) 90 tablet 0  . losartan (COZAAR) 25 MG tablet Take 1 tablet (25 mg total) by mouth 2 (two) times daily. (Patient taking differently: Take 25 mg by mouth daily. ) 180 tablet 3  . predniSONE (DELTASONE) 20 MG tablet Prednisone 20 mg daily for 3 days and stop. (Patient not taking: Reported on 01/23/2017) 3 tablet  0   No facility-administered medications prior to visit.      Allergies:   Ezetimibe; Welchol [colesevelam hcl]; Adhesive [tape]; Ceclor [cefaclor]; Elastic bandages & [zinc]; Latex; Lipitor [atorvastatin calcium]; Mevacor [lovastatin]; Pravachol; and Vasotec   Social History   Socioeconomic History  . Marital status: Married    Spouse name: None  . Number of children: 2  . Years of education: None  . Highest education level: None  Social Needs  . Financial resource strain: None  . Food insecurity - worry: None  . Food insecurity - inability: None  . Transportation needs -  medical: None  . Transportation needs - non-medical: None  Occupational History  . 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 Occupation: Network engineer    Comment: Retired  Tobacco Use  . Smoking status: Former Smoker    Packs/day: 1.50    Years: 25.00    Pack years: 37.50    Types: Cigarettes    Last attempt to quit: 02/10/1977    Years since quitting: 39.9  . Smokeless tobacco: Never Used  Substance and Sexual Activity  . Alcohol use: No  . Drug use: No  . Sexual activity: None  Other Topics Concern  . None  Social History Narrative   Patient is right handed.   Patient lives at home with husband   Patient drinks 8 oz caffeine occasionally.     Family History:  The patient's family history includes CAD in her brother and father; Diabetes type II in her brother; Stroke in her mother.   ROS:   Please see the history of present illness.    Some lower extremity swelling, dizziness, easy bruising. All other systems reviewed and are negative.   PHYSICAL EXAM:   VS:  BP (!) 162/64   Pulse 63   Ht 5' 6"  (1.676 m)   Wt 155 lb (70.3 kg)   BMI 25.02 kg/m    GEN: Well nourished, well developed, in no acute distress  HEENT: normal  Neck: no JVD, carotid bruits, or masses Cardiac: IIRR; no murmurs, rubs, or gallops,no edema  Respiratory:  clear to auscultation  bilaterally, normal work of breathing GI: soft, nontender, nondistended, + BS MS: no deformity or atrophy  Skin: warm and dry, no rash Neuro:  Alert and Oriented x 3, Strength and sensation are intact Psych: euthymic mood, full affect  Wt Readings from Last 3 Encounters:  01/23/17 155 lb (70.3 kg)  01/02/17 153 lb 8 oz (69.6 kg)  07/31/16 150 lb (68 kg)      Studies/Labs Reviewed:   EKG:  EKG not performed on today's visit.  The tracing from 01/02/17 reveals atrial fibrillation, controlled ventricular response, and diffuse ST-T wave abnormality.  Recent Labs: 01/01/2017: B Natriuretic Peptide 266.5 01/02/2017: ALT 13; BUN 13; Creatinine, Ser 0.83; Hemoglobin 12.7; Platelets 188; Potassium 3.8; Sodium 139   Lipid Panel    Component Value Date/Time   CHOL 392 (H) 12/11/2014 1644   TRIG 373 (H) 12/11/2014 1644   HDL 44 (L) 12/11/2014 1644   CHOLHDL 8.9 (H) 12/11/2014 1644   VLDL 75 (H) 12/11/2014 1644   LDLCALC 273 (H) 12/11/2014 1644   LDLDIRECT 236.0 08/02/2014 1418    Additional studies/ records that were reviewed today include:  Echocardiogram October 2018:  Study Conclusions   - Left ventricle: The cavity size was normal. Wall thickness was   normal. Systolic function was normal. The estimated ejection   fraction was in the range of 55% to 60%. - Aortic valve: Valve area (VTI): 1.12 cm^2. Valve area (Vmax):   1.02 cm^2. Valve area (Vmean): 0.96 cm^2. - Mitral valve: Mild functional MS with MAC peak diastolic gradient   only 5 mmHg. Moderately calcified annulus. Severely thickened,   severely calcified leaflets . There was mild to moderate   regurgitation. Valve area by continuity equation (using LVOT   flow): 1.33 cm^2. - Left atrium: The atrium was moderately dilated. - Atrial septum: No defect or patent foramen ovale was identified. - Pulmonary arteries: PA peak pressure: 49 mm Hg (S).  Extensive review of  November hospital stay.  ASSESSMENT:    1. Atrial  fibrillation, unspecified type ([place])   2. Chronic diastolic heart failure ([place])   3. Hypertensive heart and chronic kidney disease with heart failure and stage 1 through stage 4 chronic kidney disease, or chronic kidney disease ([place])   4. Pulmonary emphysema, unspecified emphysema type ([place])   5. Coronary artery disease involving coronary bypass graft of native heart with angina pectoris ([place])      PLAN:  In order of problems listed above:  1. With controlled rate.  No change in therapy. 2. Volume status appears euvolemic.  Continue 40 mg of furosemide daily.  Basic metabolic panel and BNP are obtained to look for evidence of dehydration and to document fall and BNP level. 3. Relatively stable with good blood pressure 4. If not addressed 5. Asymptomatic  Clinical follow-up 4-6 months.  Adjustment in diuretic therapy based upon blood work.    Medication Adjustments/Labs and Tests Ordered: Current medicines are reviewed at length with the patient today.  Concerns regarding medicines are outlined above.  Medication changes, Labs and Tests ordered today are listed in the Patient Instructions below. Patient Instructions  Medication Instructions:  Your physician recommends that you continue on your current medications as directed. Please refer to the Current Medication list given to you today.   Labwork: BMET and Pro BNP today  Testing/Procedures: None  Follow-Up: Your physician wants you to follow-up in: 4-6 months with Dr. Tamala Julian.  You will receive a reminder letter in the mail two months in advance. If you don't receive a letter, please call our office to schedule the follow-up appointment.   Any Other Special Instructions Will Be Listed Below (If Applicable).     If you need a refill on your cardiac medications before your next appointment, please call your pharmacy.      Signed, Sinclair Grooms, MD  01/23/2017 11:54 AM    [facility] [address], [place], [place]  [address] Phone: [phone]; Fax: [phone]

## 2017-01-23 NOTE — Patient Instructions (Signed)
Medication Instructions:  Your physician recommends that you continue on your current medications as directed. Please refer to the Current Medication list given to you today.   Labwork: BMET and Pro BNP today  Testing/Procedures: None  Follow-Up: Your physician wants you to follow-up in: 4-6 months with Dr. Tamala Julian.  You will receive a reminder letter in the mail two months in advance. If you don't receive a letter, please call our office to schedule the follow-up appointment.   Any Other Special Instructions Will Be Listed Below (If Applicable).     If you need a refill on your cardiac medications before your next appointment, please call your pharmacy.

## 2017-01-27 ENCOUNTER — Telehealth: Payer: Self-pay | Admitting: *Deleted

## 2017-01-27 DIAGNOSIS — E875 Hyperkalemia: Secondary | ICD-10-CM

## 2017-01-27 MED ORDER — LOSARTAN POTASSIUM 50 MG PO TABS: 50.0000 mg | ORAL_TABLET | Freq: Every day | ORAL | 3 refills | Status: DC

## 2017-01-27 NOTE — Telephone Encounter (Signed)
Notes recorded by Belva Crome, MD on 01/27/2017 at 3:39 PM EST Let the patient know she should increase losartan to 50 mg daily. Basic metabolic panel in 2 weeks. Continue current furosemide dose of 40 mg/day. A copy will be sent to Antony Contras, MD  Spoke with daughter, DPR on file.  Made her aware of new recommendations per Dr. Tamala Julian.  She will bring pt for labs on 02/11/17.  Daughter verbalized understanding and was in agreement with this plan.

## 2017-01-27 NOTE — Telephone Encounter (Signed)
-----   Message from Belva Crome, MD sent at 01/24/2017 11:00 AM EST ----- Let the patient know labs show still in HF. I am confused by med list and need to know what dose of Losartan she takes? Potassium mildly decreased and I need to treat either by increasing Losartan, and or potassium supplement or starting aldactone. BNP > 2000 so definitely don't need to decrease the diuretic. May need more. A copy will be sent to Antony Contras, MD

## 2017-02-04 ENCOUNTER — Other Ambulatory Visit: Payer: Self-pay | Admitting: Interventional Cardiology

## 2017-02-06 ENCOUNTER — Emergency Department (HOSPITAL_COMMUNITY): Payer: Medicare Other

## 2017-02-06 ENCOUNTER — Other Ambulatory Visit: Payer: Self-pay

## 2017-02-06 ENCOUNTER — Inpatient Hospital Stay (HOSPITAL_COMMUNITY)
Admission: EM | Admit: 2017-02-06 | Discharge: 2017-02-17 | DRG: 871 | Disposition: A | Discharge status: Home Health | Payer: Medicare Other | Attending: Internal Medicine | Admitting: Internal Medicine

## 2017-02-06 ENCOUNTER — Encounter (HOSPITAL_COMMUNITY): Payer: Self-pay

## 2017-02-06 DIAGNOSIS — I5032 Chronic diastolic (congestive) heart failure: Secondary | ICD-10-CM | POA: Diagnosis present

## 2017-02-06 DIAGNOSIS — R195 Other fecal abnormalities: Secondary | ICD-10-CM

## 2017-02-06 DIAGNOSIS — Z515 Encounter for palliative care: Secondary | ICD-10-CM | POA: Diagnosis not present

## 2017-02-06 DIAGNOSIS — I4891 Unspecified atrial fibrillation: Secondary | ICD-10-CM | POA: Diagnosis present

## 2017-02-06 DIAGNOSIS — K219 Gastro-esophageal reflux disease without esophagitis: Secondary | ICD-10-CM | POA: Diagnosis present

## 2017-02-06 DIAGNOSIS — I5042 Chronic combined systolic (congestive) and diastolic (congestive) heart failure: Secondary | ICD-10-CM | POA: Diagnosis present

## 2017-02-06 DIAGNOSIS — R339 Retention of urine, unspecified: Secondary | ICD-10-CM | POA: Diagnosis not present

## 2017-02-06 DIAGNOSIS — I482 Chronic atrial fibrillation: Secondary | ICD-10-CM | POA: Diagnosis not present

## 2017-02-06 DIAGNOSIS — M797 Fibromyalgia: Secondary | ICD-10-CM | POA: Diagnosis present

## 2017-02-06 DIAGNOSIS — E785 Hyperlipidemia, unspecified: Secondary | ICD-10-CM | POA: Diagnosis present

## 2017-02-06 DIAGNOSIS — Z91040 Latex allergy status: Secondary | ICD-10-CM

## 2017-02-06 DIAGNOSIS — J441 Chronic obstructive pulmonary disease with (acute) exacerbation: Secondary | ICD-10-CM | POA: Diagnosis present

## 2017-02-06 DIAGNOSIS — Z951 Presence of aortocoronary bypass graft: Secondary | ICD-10-CM

## 2017-02-06 DIAGNOSIS — I2510 Atherosclerotic heart disease of native coronary artery without angina pectoris: Secondary | ICD-10-CM | POA: Diagnosis present

## 2017-02-06 DIAGNOSIS — Z7902 Long term (current) use of antithrombotics/antiplatelets: Secondary | ICD-10-CM

## 2017-02-06 DIAGNOSIS — B9789 Other viral agents as the cause of diseases classified elsewhere: Secondary | ICD-10-CM | POA: Diagnosis present

## 2017-02-06 DIAGNOSIS — N183 Chronic kidney disease, stage 3 (moderate): Secondary | ICD-10-CM | POA: Diagnosis present

## 2017-02-06 DIAGNOSIS — J9600 Acute respiratory failure, unspecified whether with hypoxia or hypercapnia: Secondary | ICD-10-CM | POA: Diagnosis present

## 2017-02-06 DIAGNOSIS — R6520 Severe sepsis without septic shock: Secondary | ICD-10-CM | POA: Diagnosis present

## 2017-02-06 DIAGNOSIS — Z23 Encounter for immunization: Secondary | ICD-10-CM | POA: Diagnosis present

## 2017-02-06 DIAGNOSIS — Z888 Allergy status to other drugs, medicaments and biological substances status: Secondary | ICD-10-CM

## 2017-02-06 DIAGNOSIS — F05 Delirium due to known physiological condition: Secondary | ICD-10-CM | POA: Diagnosis present

## 2017-02-06 DIAGNOSIS — F0390 Unspecified dementia without behavioral disturbance: Secondary | ICD-10-CM

## 2017-02-06 DIAGNOSIS — R0602 Shortness of breath: Secondary | ICD-10-CM

## 2017-02-06 DIAGNOSIS — Z91048 Other nonmedicinal substance allergy status: Secondary | ICD-10-CM

## 2017-02-06 DIAGNOSIS — J9601 Acute respiratory failure with hypoxia: Secondary | ICD-10-CM | POA: Diagnosis not present

## 2017-02-06 DIAGNOSIS — W19XXXA Unspecified fall, initial encounter: Secondary | ICD-10-CM | POA: Diagnosis not present

## 2017-02-06 DIAGNOSIS — E876 Hypokalemia: Secondary | ICD-10-CM | POA: Diagnosis not present

## 2017-02-06 DIAGNOSIS — R062 Wheezing: Secondary | ICD-10-CM | POA: Diagnosis not present

## 2017-02-06 DIAGNOSIS — Z66 Do not resuscitate: Secondary | ICD-10-CM | POA: Diagnosis present

## 2017-02-06 DIAGNOSIS — I69354 Hemiplegia and hemiparesis following cerebral infarction affecting left non-dominant side: Secondary | ICD-10-CM | POA: Diagnosis not present

## 2017-02-06 DIAGNOSIS — I5033 Acute on chronic diastolic (congestive) heart failure: Secondary | ICD-10-CM | POA: Diagnosis present

## 2017-02-06 DIAGNOSIS — E039 Hypothyroidism, unspecified: Secondary | ICD-10-CM | POA: Diagnosis not present

## 2017-02-06 DIAGNOSIS — E872 Acidosis, unspecified: Secondary | ICD-10-CM

## 2017-02-06 DIAGNOSIS — J329 Chronic sinusitis, unspecified: Secondary | ICD-10-CM | POA: Diagnosis present

## 2017-02-06 DIAGNOSIS — R296 Repeated falls: Secondary | ICD-10-CM | POA: Diagnosis present

## 2017-02-06 DIAGNOSIS — I361 Nonrheumatic tricuspid (valve) insufficiency: Secondary | ICD-10-CM | POA: Diagnosis not present

## 2017-02-06 DIAGNOSIS — M810 Age-related osteoporosis without current pathological fracture: Secondary | ICD-10-CM | POA: Diagnosis present

## 2017-02-06 DIAGNOSIS — Z79899 Other long term (current) drug therapy: Secondary | ICD-10-CM

## 2017-02-06 DIAGNOSIS — I130 Hypertensive heart and chronic kidney disease with heart failure and stage 1 through stage 4 chronic kidney disease, or unspecified chronic kidney disease: Secondary | ICD-10-CM | POA: Diagnosis present

## 2017-02-06 DIAGNOSIS — J9621 Acute and chronic respiratory failure with hypoxia: Secondary | ICD-10-CM | POA: Diagnosis present

## 2017-02-06 DIAGNOSIS — M109 Gout, unspecified: Secondary | ICD-10-CM | POA: Diagnosis present

## 2017-02-06 DIAGNOSIS — Z8249 Family history of ischemic heart disease and other diseases of the circulatory system: Secondary | ICD-10-CM

## 2017-02-06 DIAGNOSIS — J181 Lobar pneumonia, unspecified organism: Secondary | ICD-10-CM | POA: Diagnosis present

## 2017-02-06 DIAGNOSIS — R0603 Acute respiratory distress: Secondary | ICD-10-CM

## 2017-02-06 DIAGNOSIS — J449 Chronic obstructive pulmonary disease, unspecified: Secondary | ICD-10-CM

## 2017-02-06 DIAGNOSIS — J209 Acute bronchitis, unspecified: Secondary | ICD-10-CM | POA: Diagnosis present

## 2017-02-06 DIAGNOSIS — Z87891 Personal history of nicotine dependence: Secondary | ICD-10-CM

## 2017-02-06 DIAGNOSIS — Z85820 Personal history of malignant melanoma of skin: Secondary | ICD-10-CM

## 2017-02-06 DIAGNOSIS — R6510 Systemic inflammatory response syndrome (SIRS) of non-infectious origin without acute organ dysfunction: Secondary | ICD-10-CM | POA: Insufficient documentation

## 2017-02-06 DIAGNOSIS — A419 Sepsis, unspecified organism: Principal | ICD-10-CM | POA: Diagnosis present

## 2017-02-06 DIAGNOSIS — Z7901 Long term (current) use of anticoagulants: Secondary | ICD-10-CM

## 2017-02-06 DIAGNOSIS — Z79890 Hormone replacement therapy (postmenopausal): Secondary | ICD-10-CM

## 2017-02-06 DIAGNOSIS — K5900 Constipation, unspecified: Secondary | ICD-10-CM | POA: Diagnosis not present

## 2017-02-06 DIAGNOSIS — S0083XA Contusion of other part of head, initial encounter: Secondary | ICD-10-CM | POA: Diagnosis present

## 2017-02-06 DIAGNOSIS — J4489 Other specified chronic obstructive pulmonary disease: Secondary | ICD-10-CM

## 2017-02-06 DIAGNOSIS — R0902 Hypoxemia: Secondary | ICD-10-CM

## 2017-02-06 HISTORY — DX: Chronic atrial fibrillation, unspecified: I48.20

## 2017-02-06 HISTORY — DX: Hypertensive heart disease without heart failure: I11.9

## 2017-02-06 HISTORY — DX: Tobacco use: Z72.0

## 2017-02-06 HISTORY — DX: Chronic kidney disease, stage 3 (moderate): N18.3

## 2017-02-06 HISTORY — DX: Chronic kidney disease, stage 3 unspecified: N18.30

## 2017-02-06 LAB — PROTIME-INR
INR: 2.38
PROTHROMBIN TIME: 25.8 s — AB (ref 11.4–15.2)

## 2017-02-06 LAB — CBC WITH DIFFERENTIAL/PLATELET
BASOS ABS: 0 10*3/uL (ref 0.0–0.1)
BASOS PCT: 0 %
EOS ABS: 0 10*3/uL (ref 0.0–0.7)
Eosinophils Relative: 0 %
HCT: 42.6 % (ref 36.0–46.0)
HEMOGLOBIN: 13.8 g/dL (ref 12.0–15.0)
Lymphocytes Relative: 6 %
Lymphs Abs: 0.9 10*3/uL (ref 0.7–4.0)
MCH: 29.4 pg (ref 26.0–34.0)
MCHC: 32.4 g/dL (ref 30.0–36.0)
MCV: 90.6 fL (ref 78.0–100.0)
Monocytes Absolute: 1 10*3/uL (ref 0.1–1.0)
Monocytes Relative: 6 %
NEUTROS ABS: 13.5 10*3/uL — AB (ref 1.7–7.7)
NEUTROS PCT: 88 %
Platelets: 226 10*3/uL (ref 150–400)
RBC: 4.7 MIL/uL (ref 3.87–5.11)
RDW: 15 % (ref 11.5–15.5)
WBC: 15.4 10*3/uL — AB (ref 4.0–10.5)

## 2017-02-06 LAB — BLOOD GAS, VENOUS
Acid-Base Excess: 1.4 mmol/L (ref 0.0–2.0)
Bicarbonate: 27.2 mmol/L (ref 20.0–28.0)
O2 SAT: 74.8 %
PATIENT TEMPERATURE: 98.6
PO2 VEN: 41.6 mmHg (ref 32.0–45.0)
pCO2, Ven: 50.3 mmHg (ref 44.0–60.0)
pH, Ven: 7.352 (ref 7.250–7.430)

## 2017-02-06 LAB — RESPIRATORY PANEL BY PCR
ADENOVIRUS-RVPPCR: NOT DETECTED
Bordetella pertussis: NOT DETECTED
CHLAMYDOPHILA PNEUMONIAE-RVPPCR: NOT DETECTED
CORONAVIRUS NL63-RVPPCR: NOT DETECTED
CORONAVIRUS OC43-RVPPCR: NOT DETECTED
Coronavirus 229E: NOT DETECTED
Coronavirus HKU1: NOT DETECTED
INFLUENZA A-RVPPCR: NOT DETECTED
Influenza B: NOT DETECTED
Metapneumovirus: NOT DETECTED
Mycoplasma pneumoniae: NOT DETECTED
PARAINFLUENZA VIRUS 3-RVPPCR: NOT DETECTED
PARAINFLUENZA VIRUS 4-RVPPCR: NOT DETECTED
Parainfluenza Virus 1: NOT DETECTED
Parainfluenza Virus 2: NOT DETECTED
RESPIRATORY SYNCYTIAL VIRUS-RVPPCR: NOT DETECTED
RHINOVIRUS / ENTEROVIRUS - RVPPCR: DETECTED — AB

## 2017-02-06 LAB — COMPREHENSIVE METABOLIC PANEL
ALT: 16 U/L (ref 14–54)
AST: 32 U/L (ref 15–41)
Albumin: 4.8 g/dL (ref 3.5–5.0)
Alkaline Phosphatase: 72 U/L (ref 38–126)
Anion gap: 13 (ref 5–15)
BUN: 14 mg/dL (ref 6–20)
CHLORIDE: 98 mmol/L — AB (ref 101–111)
CO2: 25 mmol/L (ref 22–32)
Calcium: 9 mg/dL (ref 8.9–10.3)
Creatinine, Ser: 0.84 mg/dL (ref 0.44–1.00)
GFR calc Af Amer: >60 mL/min (ref >60)
Glucose, Bld: 153 mg/dL — ABNORMAL HIGH (ref 65–99)
POTASSIUM: 3.6 mmol/L (ref 3.5–5.1)
SODIUM: 136 mmol/L (ref 135–145)
Total Bilirubin: 1.5 mg/dL — ABNORMAL HIGH (ref 0.3–1.2)
Total Protein: 8.1 g/dL (ref 6.5–8.1)

## 2017-02-06 LAB — I-STAT CG4 LACTIC ACID, ED: LACTIC ACID, VENOUS: 2.52 mmol/L — AB (ref 0.5–1.9)

## 2017-02-06 LAB — BRAIN NATRIURETIC PEPTIDE: B NATRIURETIC PEPTIDE 5: 245 pg/mL — AB (ref 0.0–100.0)

## 2017-02-06 LAB — LACTIC ACID, PLASMA: Lactic Acid, Venous: 2 mmol/L (ref 0.5–1.9)

## 2017-02-06 LAB — I-STAT TROPONIN, ED: TROPONIN I, POC: 0 ng/mL (ref 0.00–0.08)

## 2017-02-06 LAB — POC OCCULT BLOOD, ED: Fecal Occult Bld: POSITIVE — AB

## 2017-02-06 LAB — INFLUENZA PANEL BY PCR (TYPE A & B)
Influenza A By PCR: NEGATIVE
Influenza B By PCR: NEGATIVE

## 2017-02-06 LAB — DIGOXIN LEVEL: DIGOXIN LVL: 0.2 ng/mL — AB (ref 0.8–2.0)

## 2017-02-06 LAB — MRSA PCR SCREENING: MRSA BY PCR: NEGATIVE

## 2017-02-06 MED ORDER — IPRATROPIUM-ALBUTEROL 0.5-2.5 (3) MG/3ML IN SOLN
3.0000 mL | Freq: Once | RESPIRATORY_TRACT | Status: AC
  Administered 2017-02-06: 3 mL via RESPIRATORY_TRACT
  Filled 2017-02-06: qty 3

## 2017-02-06 MED ORDER — ENOXAPARIN SODIUM 40 MG/0.4ML ~~LOC~~ SOLN: 40.0000 mg | SUBCUTANEOUS | Status: DC

## 2017-02-06 MED ORDER — WARFARIN - PHYSICIAN DOSING INPATIENT: Freq: Every day | Status: DC

## 2017-02-06 MED ORDER — METHYLPREDNISOLONE SODIUM SUCC 40 MG IJ SOLR
40.0000 mg | Freq: Two times a day (BID) | INTRAMUSCULAR | Status: DC
  Administered 2017-02-06 – 2017-02-07 (×2): 40 mg via INTRAVENOUS
  Filled 2017-02-06 (×2): qty 1

## 2017-02-06 MED ORDER — GUAIFENESIN ER 600 MG PO TB12
600.0000 mg | ORAL_TABLET | Freq: Two times a day (BID) | ORAL | Status: DC
  Administered 2017-02-06 – 2017-02-15 (×19): 600 mg via ORAL
  Filled 2017-02-06 (×19): qty 1

## 2017-02-06 MED ORDER — LOSARTAN POTASSIUM 50 MG PO TABS
50.0000 mg | ORAL_TABLET | Freq: Every day | ORAL | Status: DC
  Administered 2017-02-06 – 2017-02-17 (×12): 50 mg via ORAL
  Filled 2017-02-06 (×12): qty 1

## 2017-02-06 MED ORDER — ACETAMINOPHEN 650 MG RE SUPP: 650.0000 mg | Freq: Four times a day (QID) | RECTAL | Status: DC | PRN

## 2017-02-06 MED ORDER — BACITRACIN-NEOMYCIN-POLYMYXIN 400-5-5000 EX OINT
TOPICAL_OINTMENT | Freq: Every day | CUTANEOUS | Status: DC
  Administered 2017-02-07 – 2017-02-12 (×6): via TOPICAL

## 2017-02-06 MED ORDER — LEVOTHYROXINE SODIUM 25 MCG PO TABS
25.0000 ug | ORAL_TABLET | Freq: Every day | ORAL | Status: DC
  Administered 2017-02-07 – 2017-02-17 (×11): 25 ug via ORAL
  Filled 2017-02-06 (×11): qty 1

## 2017-02-06 MED ORDER — SODIUM CHLORIDE 0.9% FLUSH
3.0000 mL | Freq: Two times a day (BID) | INTRAVENOUS | Status: DC
  Administered 2017-02-06 – 2017-02-08 (×4): 3 mL via INTRAVENOUS

## 2017-02-06 MED ORDER — ALLOPURINOL 100 MG PO TABS
300.0000 mg | ORAL_TABLET | Freq: Every day | ORAL | Status: DC
  Administered 2017-02-06 – 2017-02-17 (×12): 300 mg via ORAL
  Filled 2017-02-06 (×12): qty 3

## 2017-02-06 MED ORDER — CELECOXIB 200 MG PO CAPS
200.0000 mg | ORAL_CAPSULE | Freq: Every day | ORAL | Status: DC
  Administered 2017-02-06 – 2017-02-13 (×8): 200 mg via ORAL
  Filled 2017-02-06 (×8): qty 1

## 2017-02-06 MED ORDER — METOPROLOL SUCCINATE ER 25 MG PO TB24
25.0000 mg | ORAL_TABLET | Freq: Every day | ORAL | Status: DC
  Administered 2017-02-06 – 2017-02-17 (×12): 25 mg via ORAL
  Filled 2017-02-06 (×12): qty 1

## 2017-02-06 MED ORDER — ONDANSETRON HCL 4 MG/2ML IJ SOLN
4.0000 mg | Freq: Four times a day (QID) | INTRAMUSCULAR | Status: DC | PRN
  Administered 2017-02-06: 4 mg via INTRAVENOUS
  Filled 2017-02-06: qty 2

## 2017-02-06 MED ORDER — IPRATROPIUM-ALBUTEROL 0.5-2.5 (3) MG/3ML IN SOLN
3.0000 mL | Freq: Four times a day (QID) | RESPIRATORY_TRACT | Status: DC
  Administered 2017-02-06 – 2017-02-08 (×10): 3 mL via RESPIRATORY_TRACT
  Filled 2017-02-06 (×10): qty 3

## 2017-02-06 MED ORDER — DONEPEZIL HCL 10 MG PO TABS
10.0000 mg | ORAL_TABLET | Freq: Every day | ORAL | Status: DC
  Administered 2017-02-07 – 2017-02-17 (×11): 10 mg via ORAL
  Filled 2017-02-06 (×11): qty 1

## 2017-02-06 MED ORDER — WARFARIN SODIUM 5 MG PO TABS
5.0000 mg | ORAL_TABLET | ORAL | Status: DC
  Administered 2017-02-06 – 2017-02-08 (×2): 5 mg via ORAL
  Filled 2017-02-06 (×2): qty 1

## 2017-02-06 MED ORDER — ONDANSETRON HCL 4 MG PO TABS: 4.0000 mg | ORAL_TABLET | Freq: Four times a day (QID) | ORAL | Status: DC | PRN

## 2017-02-06 MED ORDER — CLOPIDOGREL BISULFATE 75 MG PO TABS
75.0000 mg | ORAL_TABLET | Freq: Every day | ORAL | Status: DC
  Administered 2017-02-06 – 2017-02-12 (×7): 75 mg via ORAL
  Filled 2017-02-06 (×7): qty 1

## 2017-02-06 MED ORDER — CITALOPRAM HYDROBROMIDE 20 MG PO TABS
20.0000 mg | ORAL_TABLET | Freq: Two times a day (BID) | ORAL | Status: DC
  Administered 2017-02-06 – 2017-02-17 (×22): 20 mg via ORAL
  Filled 2017-02-06 (×16): qty 1
  Filled 2017-02-06: qty 2
  Filled 2017-02-06 (×4): qty 1
  Filled 2017-02-06: qty 2

## 2017-02-06 MED ORDER — LEVOFLOXACIN IN D5W 750 MG/150ML IV SOLN
750.0000 mg | INTRAVENOUS | Status: DC
  Administered 2017-02-06: 750 mg via INTRAVENOUS
  Filled 2017-02-06: qty 150

## 2017-02-06 MED ORDER — AMITRIPTYLINE HCL 10 MG PO TABS
20.0000 mg | ORAL_TABLET | Freq: Every day | ORAL | Status: DC
  Administered 2017-02-06 – 2017-02-16 (×11): 20 mg via ORAL
  Filled 2017-02-06 (×12): qty 2

## 2017-02-06 MED ORDER — SODIUM CHLORIDE 0.9 % IV SOLN: 250.0000 mL | INTRAVENOUS | Status: DC | PRN

## 2017-02-06 MED ORDER — ALBUTEROL SULFATE (2.5 MG/3ML) 0.083% IN NEBU: 2.5000 mg | INHALATION_SOLUTION | RESPIRATORY_TRACT | Status: DC | PRN

## 2017-02-06 MED ORDER — ACETAMINOPHEN 325 MG PO TABS
650.0000 mg | ORAL_TABLET | Freq: Four times a day (QID) | ORAL | Status: DC | PRN
  Administered 2017-02-09: 650 mg via ORAL
  Filled 2017-02-06: qty 2

## 2017-02-06 MED ORDER — METHYLPREDNISOLONE SODIUM SUCC 125 MG IJ SOLR
125.0000 mg | Freq: Once | INTRAMUSCULAR | Status: AC
  Administered 2017-02-06: 125 mg via INTRAVENOUS
  Filled 2017-02-06: qty 2

## 2017-02-06 MED ORDER — DEXTROSE 5 % IV SOLN
1.0000 g | INTRAVENOUS | Status: DC
  Administered 2017-02-06: 1 g via INTRAVENOUS
  Filled 2017-02-06 (×2): qty 10

## 2017-02-06 MED ORDER — TETANUS-DIPHTH-ACELL PERTUSSIS 5-2.5-18.5 LF-MCG/0.5 IM SUSP
0.5000 mL | Freq: Once | INTRAMUSCULAR | Status: AC
  Administered 2017-02-06: 0.5 mL via INTRAMUSCULAR
  Filled 2017-02-06: qty 0.5

## 2017-02-06 MED ORDER — FUROSEMIDE 40 MG PO TABS
40.0000 mg | ORAL_TABLET | Freq: Every day | ORAL | Status: DC
  Administered 2017-02-07 – 2017-02-12 (×6): 40 mg via ORAL
  Filled 2017-02-06 (×6): qty 1

## 2017-02-06 MED ORDER — MEMANTINE HCL ER 28 MG PO CP24
28.0000 mg | ORAL_CAPSULE | Freq: Every day | ORAL | Status: DC
  Administered 2017-02-06 – 2017-02-17 (×12): 28 mg via ORAL
  Filled 2017-02-06 (×12): qty 1

## 2017-02-06 MED ORDER — DONEPEZIL HCL 10 MG PO TABS
10.0000 mg | ORAL_TABLET | Freq: Every day | ORAL | Status: DC
  Filled 2017-02-06 (×2): qty 1

## 2017-02-06 MED ORDER — WARFARIN SODIUM 2.5 MG PO TABS
7.5000 mg | ORAL_TABLET | ORAL | Status: AC
  Administered 2017-02-07: 7.5 mg via ORAL
  Filled 2017-02-06: qty 1

## 2017-02-06 MED ORDER — AMLODIPINE BESYLATE 5 MG PO TABS
5.0000 mg | ORAL_TABLET | Freq: Every day | ORAL | Status: DC
  Administered 2017-02-06 – 2017-02-12 (×7): 5 mg via ORAL
  Filled 2017-02-06 (×7): qty 1

## 2017-02-06 MED ORDER — BENZONATATE 100 MG PO CAPS
100.0000 mg | ORAL_CAPSULE | Freq: Three times a day (TID) | ORAL | Status: DC
  Administered 2017-02-06 – 2017-02-17 (×34): 100 mg via ORAL
  Filled 2017-02-06 (×34): qty 1

## 2017-02-06 MED ORDER — DIGOXIN 125 MCG PO TABS
0.1250 mg | ORAL_TABLET | ORAL | Status: DC
  Administered 2017-02-06 – 2017-02-16 (×5): 0.125 mg via ORAL
  Filled 2017-02-06 (×6): qty 1

## 2017-02-06 MED ORDER — POTASSIUM CHLORIDE CRYS ER 20 MEQ PO TBCR
20.0000 meq | EXTENDED_RELEASE_TABLET | Freq: Every day | ORAL | Status: DC
  Administered 2017-02-06: 20 meq via ORAL
  Filled 2017-02-06: qty 1

## 2017-02-06 MED ORDER — SODIUM CHLORIDE 0.9% FLUSH: 3.0000 mL | INTRAVENOUS | Status: DC | PRN

## 2017-02-06 NOTE — ED Triage Notes (Signed)
Pt had a fall this am and complains of right wrist pain and has abrasions on her face Pt is also short of breath and has audible wheezing throughout

## 2017-02-06 NOTE — ED Triage Notes (Signed)
Pt presented from home and per family pt had fell when trying to get up out of chair due to shortness of breath. Pt has abrasion to forehead and family reports that pt is on Coumadin therapy.

## 2017-02-06 NOTE — Progress Notes (Signed)
Pt not in any respiratory distress at this time.  Pt resting stable and comfortable on 3Lpm nasal cannula.  RT will continue to monitor for BiPAP need.

## 2017-02-06 NOTE — ED Notes (Signed)
I gave critical I Stat CG4 result to MD Little

## 2017-02-06 NOTE — ED Notes (Signed)
Hospitalist at bedside 

## 2017-02-06 NOTE — ED Notes (Signed)
Patient O2 Sat 91% on 2L O2. Respiratory therapist notified of need for assessment for bipap. Patient placed on 3L O2 for CT scan.

## 2017-02-06 NOTE — Progress Notes (Signed)
I went to do nursing admission history on patient in Room 1235. Pt's daughter at bedside. Pt was very short of breath and pt's daughter stated she would answer the questions for her. She wanted to step out in the hallway to answer them. She started answering questions and then said that her sister is a Marine scientist and she had said this had already been done. I explained to her that I was the only admitting nurse in the building and it had not been done. She said ok and then answered some more questions. She then said she did not understand because her sister who is a nurse said someone did it in the emergency room. I explained to her that I am the only admission nurse in the hospital today and that I had not seen her mom in the emergency room. I told her that the different departments will ask a lot of the same questions. I told her that I did not want to upset her and that if she did not want to answer any more of them that is was ok and that I could make a note in the chart. She said that she would answer them and she continued answering the questions.  Lucius Conn BSN, RN-BC Admissions RN 02/06/2017 3:31 PM

## 2017-02-06 NOTE — ED Notes (Signed)
Respiratory therapy at bedside.

## 2017-02-06 NOTE — ED Notes (Signed)
Patient transported to CT 

## 2017-02-06 NOTE — ED Provider Notes (Signed)
[facility] DEPT Provider Note   CSN: [id number] Arrival date & time: 02/06/17  2979     History   Chief Complaint Chief Complaint  Patient presents with  . Shortness of Breath  . Fall    HPI Amber [name] is a 82 y.o. female.  Level 5 caveat dementia history is obtained from patient's daughter who accompanies her  HPI complains of shortness of breath progressively worsening onset yesterday this morning she fell on her face while getting up out of a chair scraping her face and injured her right wrist.  Other associated symptoms include slight cough no fever no vomiting.  Patient complains of pain at mild risk.  No treatment prior to coming here although daughter reports her losartan was increased 2 weeks ago due to increasing BNP patient does report that she is becoming tired to breathe  Past Medical History:  Diagnosis Date  . Abnormality of gait 08/02/2014  . Atrial fibrillation ([place])    a. chronic/rate controlled;  b. chronic coumadin.  [name] Cerebrovascular disease 05/21/2016  . Cervical spinal stenosis   . Chronic diastolic CHF (congestive heart failure) ([place])    a. 02/2013 Echo: EF 55-60%, mild AI, mod dil LA.  [name] COPD with emphysema ([place])    PFT 05/02/10>>FEV1 1.35(62%), FEV1% 66, DLCO 75%  . Coronary artery disease    a. s/p CABG;  b. 04/2010 Neg MV  . CVA (cerebral vascular accident) ([place])    left sided weakness  . Dementia   . Depression   . Fibromyalgia   . GERD (gastroesophageal reflux disease)    pepcid 2-3 times per week  . Gout   . Hemiparesis and alteration of sensations as late effects of stroke ([place]) 01/17/2015  . History of melanoma    squamous cell, melanoma  . Hyperlipidemia   . Hypertension   . Hypothyroidism   . Insomnia   . Intermittent confusion   . Memory change 01/23/2014  . Nasal polyposis   . Osteoarthritis   . Osteoporosis   . Pneumonia    1990    Patient Active Problem List   Diagnosis Date Noted  .  Acute bronchitis 01/01/2017  . Encounter for therapeutic drug monitoring 10/20/2016  . Cerebrovascular disease 05/21/2016  . Hemiparesis and alteration of sensations as late effects of stroke ([place]) 01/17/2015  . Memory change 01/23/2014  . Delirium due to multiple etiologies 02/14/2013  . 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 Long-term (current) use of anticoagulants   . Streptococcal pneumonia ([place]) 02/12/2013  . Vertebral artery stenosis 12/18/2011  . COPD (chronic obstructive pulmonary disease) ([place]) 06/04/2010  . Hypertensive heart and chronic kidney disease with heart failure and stage 1 through stage 4 chronic kidney disease, or chronic kidney disease ([place]) 05/29/2010  . Fibromyalgia 05/10/2010  . Depression 05/10/2010  . Cervical spinal stenosis 05/10/2010  . Hypothyroidism 05/10/2010  . Osteoporosis 05/10/2010  . Coronary artery disease involving coronary bypass graft of native heart with angina pectoris ([place]) 05/10/2010  . Chronic diastolic heart failure ([place])   . Dyslipidemia   . Atrial fibrillation ([place]) 05/06/2010  . Unspecified cerebral artery occlusion with cerebral infarction 05/06/2010    Past Surgical History:  Procedure Laterality Date  . ABDOMINAL HYSTERECTOMY    . BREAST LUMPECTOMY  1980s   Benign lesion - right  . carpel tunnel     right  . CORONARY ARTERY BYPASS GRAFT  2000  . EYE SURGERY     bilateral cataracts  . IR  GENERIC HISTORICAL  03/31/2016   IR RADIOLOGIST EVAL & MGMT 03/31/2016 [facility]-INTERV RAD  . RADIOLOGY WITH ANESTHESIA  12/24/2011   Procedure: RADIOLOGY WITH ANESTHESIA;  Surgeon: Medication Radiologist, MD;  Location: [facility];  Service: Radiology;  Laterality: N/A;  Extra Cranial Vascular Stent  . RADIOLOGY WITH ANESTHESIA N/A 07/19/2014   Procedure: ANGIOPLASTY;  Surgeon: Luanne Bras, MD;  Location: [facility];  Service: Radiology;  Laterality: N/A;  . RADIOLOGY WITH ANESTHESIA N/A 07/27/2014   Procedure: ANGIOPLASTY;  Surgeon: Luanne Bras,  MD;  Location: [facility];  Service: Radiology;  Laterality: N/A;  . TONSILLECTOMY      OB History    No data available       Home Medications    Prior to Admission medications   Medication Sig Start Date End Date Taking? Authorizing Provider  albuterol (PROVENTIL HFA;VENTOLIN HFA) 108 (90 Base) MCG/ACT inhaler Inhale 2 puffs into the lungs every 4 (four) hours as needed for wheezing or shortness of breath. 07/31/16   Joy, Shawn C, PA-C  allopurinol (ZYLOPRIM) 300 MG tablet Take 1 tablet (300 mg total) by mouth every other day. Patient taking differently: Take 300 mg by mouth daily.  03/06/16   Burtis Junes, NP  amitriptyline (ELAVIL) 10 MG tablet TAKE 2 TABLETS BY MOUTH AT BEDTIME Patient taking differently: TAKE 20mg   BY MOUTH AT BEDTIME 09/01/16   Kathrynn Ducking, MD  amLODipine (NORVASC) 5 MG tablet Take 1 tablet (5 mg total) by mouth at bedtime. 01/12/17   Belva Crome, MD  celecoxib (CELEBREX) 200 MG capsule Take 200 mg by mouth every morning.     [provider]  citalopram (CELEXA) 20 MG tablet TAKE 1 TABLET BY MOUTH TWICE A DAY 11/09/14   Darlin Coco, MD  clopidogrel (PLAVIX) 75 MG tablet TAKE 1 TABLET (75 MG TOTAL) BY MOUTH DAILY. 02/04/17   Belva Crome, MD  COUMADIN 5 MG tablet TAKE AS DIRECTED BY COUMADIN CLINIC. Hold coumadin November 24 th and November 25 th.  Recheck INR on Monday. 01/03/17   Hosie Poisson, MD  digoxin (LANOXIN) 0.125 MG tablet Take 1 tablet (0.125 mg total) by mouth on Mondays, Wednesdays, & Fridays 03/27/16   Belva Crome, MD  donepezil (ARICEPT) 10 MG tablet Take 1 tablet (10 mg total) by mouth at bedtime. Patient taking differently: Take 10 mg by mouth daily.  06/13/16   Kathrynn Ducking, MD  ergocalciferol (VITAMIN D2) 50000 UNITS capsule Take 50,000 Units by mouth 2 (two) times a week. Tuesday and Friday    [provider]  furosemide (LASIX) 40 MG tablet Take 1 tablet (40 mg total) by mouth daily. 01/04/17   Hosie Poisson,  MD  KLOR-CON M20 20 MEQ tablet Take 20 mEq by mouth daily.  03/11/16   [provider]  levothyroxine (SYNTHROID, LEVOTHROID) 25 MCG tablet Take 25 mcg by mouth every morning.     [provider]  losartan (COZAAR) 50 MG tablet Take 1 tablet (50 mg total) by mouth daily. 01/27/17 01/22/18  Belva Crome, MD  memantine (NAMENDA XR) 28 MG CP24 24 hr capsule TAKE ONE CAPSULE BY MOUTH EVERY DAY 07/15/16   Kathrynn Ducking, MD  metoprolol succinate (TOPROL-XL) 25 MG 24 hr tablet TAKE 1 TABLET (25 MG TOTAL) BY MOUTH DAILY. Patient taking differently: TAKE 1 TABLET (25 MG TOTAL) BY MOUTH DAILY. Morning 05/16/16   Belva Crome, MD  nitroGLYCERIN (NITROSTAT) 0.4 MG SL tablet Place 1 tablet (0.4 mg  total) under the tongue every 5 (five) minutes as needed for chest pain. 09/13/12   Darlin Coco, MD  PROAIR HFA 108 (90 BASE) MCG/ACT inhaler INHALE 2 PUFFS INTO LUNGS EVERY 6 HOURS AS NEEDED FOR WHEEZING 12/22/13   Darlin Coco, MD    Family History Family History  Problem Relation Age of Onset  . Stroke Mother   . CAD Father   . Diabetes type II Brother   . CAD Brother     Social History Social History   Tobacco Use  . Smoking status: Former Smoker    Packs/day: 1.50    Years: 25.00    Pack years: 37.50    Types: Cigarettes    Last attempt to quit: 02/10/1977    Years since quitting: 40.0  . Smokeless tobacco: Never Used  Substance Use Topics  . Alcohol use: No  . Drug use: No     Allergies   Ezetimibe; Welchol [colesevelam hcl]; Adhesive [tape]; Ceclor [cefaclor]; Elastic bandages & [zinc]; Latex; Lipitor [atorvastatin calcium]; Mevacor [lovastatin]; Pravachol; and Vasotec   Review of Systems Review of Systems  Unable to perform ROS: Dementia  Respiratory: Positive for shortness of breath.   Musculoskeletal: Positive for arthralgias and gait problem.       Right wrist pain and ambulatory but walks with chronic limp.  Wears brace on right leg chronically    Skin: Positive for wound.       Facial abrasions     Physical Exam Updated Vital Signs BP (!) 182/85   Pulse 86   Temp 98.7 F (37.1 C) (Oral)   Resp 18   SpO2 93%   Physical Exam  Constitutional:  Clinically and acutely ill-appearing  HENT:  Multiple facial abrasions.  No soft tissue swelling of face.  No trismus.  Eyes: Conjunctivae and EOM are normal.  Neck: Neck supple. JVD present. No tracheal deviation present. No thyromegaly present.  Cardiovascular: Normal rate.  Irregularly irregular  Pulmonary/Chest: She is in respiratory distress.  Dyspneic.  Rales all the way up bilaterally  Abdominal: Soft. Bowel sounds are normal. She exhibits no distension. There is no tenderness.  Genitourinary: Rectal exam shows guaiac positive stool.  Genitourinary Comments: Rectum normal tone no gross blood.  Stool Hemoccult positive  Musculoskeletal: Normal range of motion. She exhibits no edema or tenderness.  Entire spine is nontender.  Pelvis stable nontender.  Right upper extremity mildly tender at wrist.  No deformity.  Radial pulse 2+ to all other extremities or contusion abrasion or tenderness neurovascularly intact  Neurological: She is alert. No cranial nerve deficit. Coordination normal.  Skin: Skin is dry. No rash noted.  Psychiatric: She has a normal mood and affect.  Nursing note and vitals reviewed.    ED Treatments / Results  Labs (all labs ordered are listed, but only abnormal results are displayed) Labs Reviewed  I-STAT CG4 LACTIC ACID, ED - Abnormal; Notable for the following components:      Result Value   Lactic Acid, Venous 2.52 (*)    All other components within normal limits  COMPREHENSIVE METABOLIC PANEL  BRAIN NATRIURETIC PEPTIDE  CBC WITH DIFFERENTIAL/PLATELET  PROTIME-INR  BLOOD GAS, VENOUS  DIGOXIN LEVEL  I-STAT TROPONIN, ED  POC OCCULT BLOOD, ED    EKG  EKG Interpretation None     ED ECG REPORT   Date: 02/06/2017  Rate: 90  Rhythm:  atrial fibrillation  QRS Axis: normal  Intervals: normal  ST/T Wave abnormalities: nonspecific T wave changes  Conduction  Disutrbances:none  Narrative Interpretation:   Old EKG Reviewed: unchanged  I have personally reviewed the EKG tracing and agree with the computerized printout as noted. Results for orders placed or performed during the hospital encounter of 02/06/17  Comprehensive metabolic panel  Result Value Ref Range   Sodium 136 135 - 145 mmol/L   Potassium 3.6 3.5 - 5.1 mmol/L   Chloride 98 (L) 101 - 111 mmol/L   CO2 25 22 - 32 mmol/L   Glucose, Bld 153 (H) 65 - 99 mg/dL   BUN 14 6 - 20 mg/dL   Creatinine, Ser 0.84 0.44 - 1.00 mg/dL   Calcium 9.0 8.9 - 10.3 mg/dL   Total Protein 8.1 6.5 - 8.1 g/dL   Albumin 4.8 3.5 - 5.0 g/dL   AST 32 15 - 41 U/L   ALT 16 14 - 54 U/L   Alkaline Phosphatase 72 38 - 126 U/L   Total Bilirubin 1.5 (H) 0.3 - 1.2 mg/dL   GFR calc non Af Amer >60 >60 mL/min   GFR calc Af Amer >60 >60 mL/min   Anion gap 13 5 - 15  Brain natriuretic peptide  Result Value Ref Range   B Natriuretic Peptide 245.0 (H) 0.0 - 100.0 pg/mL  CBC with Differential  Result Value Ref Range   WBC 15.4 (H) 4.0 - 10.5 K/uL   RBC 4.70 3.87 - 5.11 MIL/uL   Hemoglobin 13.8 12.0 - 15.0 g/dL   HCT 42.6 36.0 - 46.0 %   MCV 90.6 78.0 - 100.0 fL   MCH 29.4 26.0 - 34.0 pg   MCHC 32.4 30.0 - 36.0 g/dL   RDW 15.0 11.5 - 15.5 %   Platelets 226 150 - 400 K/uL   Neutrophils Relative % 88 %   Neutro Abs 13.5 (H) 1.7 - 7.7 K/uL   Lymphocytes Relative 6 %   Lymphs Abs 0.9 0.7 - 4.0 K/uL   Monocytes Relative 6 %   Monocytes Absolute 1.0 0.1 - 1.0 K/uL   Eosinophils Relative 0 %   Eosinophils Absolute 0.0 0.0 - 0.7 K/uL   Basophils Relative 0 %   Basophils Absolute 0.0 0.0 - 0.1 K/uL  Protime-INR  Result Value Ref Range   Prothrombin Time 25.8 (H) 11.4 - 15.2 seconds   INR 2.38   Blood gas, venous  Result Value Ref Range   pH, Ven 7.352 7.250 - 7.430   pCO2, Ven 50.3 44.0 -  60.0 mmHg   pO2, Ven 41.6 32.0 - 45.0 mmHg   Bicarbonate 27.2 20.0 - 28.0 mmol/L   Acid-Base Excess 1.4 0.0 - 2.0 mmol/L   O2 Saturation 74.8 %   Patient temperature 98.6    Collection site VENOUS    Sample type VENOUS   Digoxin level  Result Value Ref Range   Digoxin Level 0.2 (L) 0.8 - 2.0 ng/mL  I-stat troponin, ED  Result Value Ref Range   Troponin i, poc 0.00 0.00 - 0.08 ng/mL   Comment 3          I-Stat CG4 Lactic Acid, ED  Result Value Ref Range   Lactic Acid, Venous 2.52 (HH) 0.5 - 1.9 mmol/L   Comment NOTIFIED PHYSICIAN   POC occult blood, ED Provider will collect  Result Value Ref Range   Fecal Occult Bld POSITIVE (A) NEGATIVE   Dg Wrist Complete Right  Result Date: 02/06/2017 CLINICAL DATA:  Fall.  Right-sided wrist pain. EXAM: RIGHT WRIST - COMPLETE 3+ VIEW COMPARISON:  None. FINDINGS: Advanced  degenerative changes are again seen in the right wrist. Subluxation of the first [facility] joint is again noted. There is soft tissue swelling about the wrist. No acute fracture is evident. Vascular calcifications are noted. IMPRESSION: 1. Soft tissue swelling about the right wrist without acute fracture. 2. Advanced degenerative changes. 3. Atherosclerosis. Electronically Signed   By: San Morelle M.D.   On: 02/06/2017 08:00   Ct Head Wo Contrast  Result Date: 02/06/2017 CLINICAL DATA:  Fall, on Coumadin, forehead abrasion, initial encounter. EXAM: CT HEAD WITHOUT CONTRAST CT CERVICAL SPINE WITHOUT CONTRAST TECHNIQUE: Multidetector CT imaging of the head and cervical spine was performed following the standard protocol without intravenous contrast. Multiplanar CT image reconstructions of the cervical spine were also generated. COMPARISON:  07/31/2016. FINDINGS: CT HEAD FINDINGS Brain: No evidence of an acute infarct, acute hemorrhage, mass lesion, mass effect or hydrocephalus. Atrophy and confluent periventricular low attenuation. Ventricular prominence is unchanged and in  proportion to the degree of atrophy. Vascular: No hyperdense vessel or unexpected calcification. Skull: Normal. Negative for fracture or focal lesion. Sinuses/Orbits: No acute finding. Other: None. CT CERVICAL SPINE FINDINGS Alignment: Image quality is degraded by motion. Reversal of the normal cervical lordosis, centered at C6. Skull base and vertebrae: Image quality is degraded by motion. No definite fracture. Soft tissues and spinal canal: Image quality is degraded by motion. No definite acute finding. Disc levels: Image quality is degraded by motion. Multilevel facet hypertrophy. Endplate degenerative changes are seen anteriorly from C4-5 to C7-T1. Upper chest: Image quality is degraded by motion. Biapical pleuroparenchymal scarring. Other: None. IMPRESSION: 1. No acute intracranial abnormality. 2. Cervical spine images are degraded by motion. No definite fracture or subluxation. 3. Brain atrophy and chronic microvascular white matter ischemic changes. 4. Reversal of the normal cervical lordosis with degenerative disc disease. Electronically Signed   By: Lorin Picket M.D.   On: 02/06/2017 07:54   Ct Cervical Spine Wo Contrast  Result Date: 02/06/2017 CLINICAL DATA:  Fall, on Coumadin, forehead abrasion, initial encounter. EXAM: CT HEAD WITHOUT CONTRAST CT CERVICAL SPINE WITHOUT CONTRAST TECHNIQUE: Multidetector CT imaging of the head and cervical spine was performed following the standard protocol without intravenous contrast. Multiplanar CT image reconstructions of the cervical spine were also generated. COMPARISON:  07/31/2016. FINDINGS: CT HEAD FINDINGS Brain: No evidence of an acute infarct, acute hemorrhage, mass lesion, mass effect or hydrocephalus. Atrophy and confluent periventricular low attenuation. Ventricular prominence is unchanged and in proportion to the degree of atrophy. Vascular: No hyperdense vessel or unexpected calcification. Skull: Normal. Negative for fracture or focal lesion.  Sinuses/Orbits: No acute finding. Other: None. CT CERVICAL SPINE FINDINGS Alignment: Image quality is degraded by motion. Reversal of the normal cervical lordosis, centered at C6. Skull base and vertebrae: Image quality is degraded by motion. No definite fracture. Soft tissues and spinal canal: Image quality is degraded by motion. No definite acute finding. Disc levels: Image quality is degraded by motion. Multilevel facet hypertrophy. Endplate degenerative changes are seen anteriorly from C4-5 to C7-T1. Upper chest: Image quality is degraded by motion. Biapical pleuroparenchymal scarring. Other: None. IMPRESSION: 1. No acute intracranial abnormality. 2. Cervical spine images are degraded by motion. No definite fracture or subluxation. 3. Brain atrophy and chronic microvascular white matter ischemic changes. 4. Reversal of the normal cervical lordosis with degenerative disc disease. Electronically Signed   By: Lorin Picket M.D.   On: 02/06/2017 07:54   Dg Chest Port 1 View  Result Date: 02/06/2017 CLINICAL DATA:  Shortness of  breath with fall EXAM: PORTABLE CHEST 1 VIEW COMPARISON:  January 03, 2017 FINDINGS: There is no edema or consolidation. Lungs are mildly hyperexpanded. Heart is upper normal in size with pulmonary vascularity within normal limits. Patient is status post internal mammary bypass grafting. There is aortic atherosclerosis. No adenopathy. No evident bone lesions. No pneumothorax. IMPRESSION: Aortic atherosclerosis. No edema or consolidation. Stable cardiac silhouette. Aortic Atherosclerosis (ICD10-I70.0). Electronically Signed   By: Lowella Grip III M.D.   On: 02/06/2017 08:00   Radiology No results found.  Procedures Procedures (including critical care time)  Medications Ordered in ED Medications  Tdap (BOOSTRIX) injection 0.5 mL (not administered)   8:45 AM patient resting much more comfortably after treatment with BiPAP an duoneb . Dr. Wynelle Cleveland, hospitalist consulted and  will evaluate patient in the ED.  I have also consult palliative care  Initial Impression / Assessment and Plan / ED Course  I have reviewed the triage vital signs and the nursing notes.  Pertinent labs & imaging results that were available during my care of the patient were reviewed by me and considered in my medical decision making (see chart for details).     Patient to be admitted by Dr. Wynelle Cleveland   Final Clinical Impressions(s) / ED Diagnoses   Final diagnoses:  None   Dx #1acute respiratory failure, unspecified type #2 fall #3 contusions multiple sites CRITICAL CARE Performed by: Orlie Dakin Total critical care time: 30 minutes Critical care time was exclusive of separately billable procedures and treating other patients. Critical care was necessary to treat or prevent imminent or life-threatening deterioration. Critical care was time spent personally by me on the following activities: development of treatment plan with patient and/or surrogate as well as nursing, discussions with consultants, evaluation of patient's response to treatment, examination of patient, obtaining history from patient or surrogate, ordering and performing treatments and interventions, ordering and review of laboratory studies, ordering and review of radiographic studies, pulse oximetry and re-evaluation of patient's condition. ED Discharge Orders    None       Orlie Dakin, MD 02/06/17 [phone]

## 2017-02-06 NOTE — H&P (Addendum)
History and Physical    Amber [name] XHB:[id number] DOB: April 25, 1934 DOA: 02/06/2017    PCP: Antony Contras, MD  Patient coming from: home with husband  Chief Complaint: shortness of breath and a fall  HPI: Amber [name] is a 82 y.o. female with medical history of A-fib, COPD, CHF (diastolic), CAD s/p CABG, gout, mild dementia who presents from home with dyspnea and a fall.  Per her daughter who is nurse and who gives most of the history, the patient has had a runny nose, sore throat and a cough for about 2 days now. Her grand children were sick with the same illness a few days ago. She has become steadily short of breath and this morning was breathing with rapid shallow respirations. The patient has not had a fever or chills. No chest pain. Her cough is productive but she cannot tell if it is clear or green sputum. No sinus pain or earache.   ED Course:  Noted to have increased work of breathing with respiratory fatigue and started on BiPAP CXR clear  Solumedrol ordered- not yet given Heme + WBC 15.4 LA- 2.52  Review of Systems:  All other systems reviewed and apart from HPI, are negative.  Past Medical History:  Diagnosis Date  . Abnormality of gait 08/02/2014  . Atrial fibrillation ([place])    a. chronic/rate controlled;  b. chronic coumadin.  [name] Cerebrovascular disease 05/21/2016  . Cervical spinal stenosis   . Chronic diastolic CHF (congestive heart failure) ([place])    a. 02/2013 Echo: EF 55-60%, mild AI, mod dil LA.  [name] COPD with emphysema ([place])    PFT 05/02/10>>FEV1 1.35(62%), FEV1% 66, DLCO 75%  . Coronary artery disease    a. s/p CABG;  b. 04/2010 Neg MV  . CVA (cerebral vascular accident) ([place])    left sided weakness  . Dementia   . Depression   . Fibromyalgia   . GERD (gastroesophageal reflux disease)    pepcid 2-3 times per week  . Gout   . Hemiparesis and alteration of sensations as late effects of stroke ([place]) 01/17/2015  . History of melanoma    squamous cell,  melanoma  . Hyperlipidemia   . Hypertension   . Hypothyroidism   . Insomnia   . Intermittent confusion   . Memory change 01/23/2014  . Nasal polyposis   . Osteoarthritis   . Osteoporosis   . Pneumonia    1990    Past Surgical History:  Procedure Laterality Date  . ABDOMINAL HYSTERECTOMY    . BREAST LUMPECTOMY  1980s   Benign lesion - right  . carpel tunnel     right  . CORONARY ARTERY BYPASS GRAFT  2000  . EYE SURGERY     bilateral cataracts  . IR GENERIC HISTORICAL  03/31/2016   IR RADIOLOGIST EVAL & MGMT 03/31/2016 [facility]INTER[facility]  . RADIOLOGY WITH ANESTHESIA  12/24/2011   Procedure: RADIOLOGY WITH ANESTHESIA;  Surgeon: Medication Radiologist, MD;  Location: [place];  Service: Radiology;  Laterality: N/A;  Extra Cranial Vascular Stent  . RADIOLOGY WITH ANESTHESIA N/A 07/19/2014   Procedure: ANGIOPLASTY;  Surgeon: Luanne Bras, MD;  Location: [place];  Service: Radiology;  Laterality: N/A;  . RADIOLOGY WITH ANESTHESIA N/A 07/27/2014   Procedure: ANGIOPLASTY;  Surgeon: Luanne Bras, MD;  Location: [place];  Service: Radiology;  Laterality: N/A;  . TONSILLECTOMY      Social History:   reports that she quit smoking about 40 years ago. Her smoking use included  cigarettes. She has a 37.50 pack-year smoking history. she has never used smokeless tobacco. She reports that she does not drink alcohol or use drugs.  Allergies  Allergen Reactions  . Ezetimibe Other (See Comments)    Myalgia  . Welchol [Colesevelam Hcl] Other (See Comments)    Muscle aches  . Adhesive [Tape] Itching, Rash and Other (See Comments)    Burning   . Ceclor [Cefaclor] Other (See Comments)    Unknown allergic reaction  . Elastic Bandages & [Zinc] Rash and Other (See Comments)    Turns red on the areas it touches  . Latex Itching, Rash and Other (See Comments)    Burning   . Lipitor [Atorvastatin Calcium] Other (See Comments)    Increased fibromyalgia pain  . Mevacor [Lovastatin] Other (See Comments)      Increased fibromyalgia pain  . Pravachol Other (See Comments)    Increased fibromyalgia pain  . Vasotec Other (See Comments)    Unknown allergic reaction    Family History  Problem Relation Age of Onset  . Stroke Mother   . CAD Father   . Diabetes type II Brother   . CAD Brother      Prior to Admission medications   Medication Sig Start Date End Date Taking? Authorizing Provider  albuterol (PROVENTIL HFA;VENTOLIN HFA) 108 (90 Base) MCG/ACT inhaler Inhale 2 puffs into the lungs every 4 (four) hours as needed for wheezing or shortness of breath. 07/31/16  Yes Joy, Shawn C, PA-C  allopurinol (ZYLOPRIM) 300 MG tablet Take 1 tablet (300 mg total) by mouth every other day. Patient taking differently: Take 300 mg by mouth daily.  03/06/16  Yes Burtis Junes, NP  amitriptyline (ELAVIL) 10 MG tablet TAKE 2 TABLETS BY MOUTH AT BEDTIME Patient taking differently: TAKE 20mg   BY MOUTH AT BEDTIME 09/01/16  Yes Kathrynn Ducking, MD  amLODipine (NORVASC) 5 MG tablet Take 1 tablet (5 mg total) by mouth at bedtime. 01/12/17  Yes Belva Crome, MD  celecoxib (CELEBREX) 200 MG capsule Take 200 mg by mouth every morning.    Yes [provider]  citalopram (CELEXA) 20 MG tablet TAKE 1 TABLET BY MOUTH TWICE A DAY 11/09/14  Yes Darlin Coco, MD  clopidogrel (PLAVIX) 75 MG tablet TAKE 1 TABLET (75 MG TOTAL) BY MOUTH DAILY. 02/04/17  Yes Belva Crome, MD  COUMADIN 5 MG tablet TAKE AS DIRECTED BY COUMADIN CLINIC. Hold coumadin November 24 th and November 25 th.  Recheck INR on Monday. Patient taking differently: Take 5-7.5 mg by mouth daily. Take 5 mg everyday except 7.5 mg on Tuesday and Saturday 01/03/17  Yes Hosie Poisson, MD  digoxin (LANOXIN) 0.125 MG tablet Take 1 tablet (0.125 mg total) by mouth on Mondays, Wednesdays, & Fridays 03/27/16  Yes Belva Crome, MD  donepezil (ARICEPT) 10 MG tablet Take 1 tablet (10 mg total) by mouth at bedtime. Patient taking differently: Take 10 mg by  mouth daily.  06/13/16  Yes Kathrynn Ducking, MD  ergocalciferol (VITAMIN D2) 50000 UNITS capsule Take 50,000 Units by mouth 2 (two) times a week. Tuesday and Friday   Yes [provider]  furosemide (LASIX) 40 MG tablet Take 1 tablet (40 mg total) by mouth daily. 01/04/17  Yes Hosie Poisson, MD  KLOR-CON M20 20 MEQ tablet Take 20 mEq by mouth daily.  03/11/16  Yes [provider]  levothyroxine (SYNTHROID, LEVOTHROID) 25 MCG tablet Take 25 mcg by mouth every morning.    Yes  [provider]  losartan (COZAAR) 50 MG tablet Take 1 tablet (50 mg total) by mouth daily. 01/27/17 01/22/18 Yes Belva Crome, MD  memantine (NAMENDA XR) 28 MG CP24 24 hr capsule TAKE ONE CAPSULE BY MOUTH EVERY DAY 07/15/16  Yes Kathrynn Ducking, MD  metoprolol succinate (TOPROL-XL) 25 MG 24 hr tablet TAKE 1 TABLET (25 MG TOTAL) BY MOUTH DAILY. Patient taking differently: TAKE 1 TABLET (25 MG TOTAL) BY MOUTH DAILY. Morning 05/16/16  Yes Belva Crome, MD  nitroGLYCERIN (NITROSTAT) 0.4 MG SL tablet Place 1 tablet (0.4 mg total) under the tongue every 5 (five) minutes as needed for chest pain. 09/13/12  Yes Darlin Coco, MD  PROAIR HFA 108 (90 BASE) MCG/ACT inhaler INHALE 2 PUFFS INTO LUNGS EVERY 6 HOURS AS NEEDED FOR WHEEZING Patient not taking: Reported on 02/06/2017 12/22/13   Darlin Coco, MD    Physical Exam: Wt Readings from Last 3 Encounters:  01/23/17 70.3 kg (155 lb)  01/02/17 69.6 kg (153 lb 8 oz)  07/31/16 68 kg (150 lb)   Vitals:   02/06/17 0803 02/06/17 0836 02/06/17 0917 02/06/17 1000  BP:  (!) 140/56 (!) 140/56 (!) 147/96  Pulse: 95 71 83 88  Resp: (!) 29 (!) 28 (!) 21 (!) 25  Temp:      TempSrc:      SpO2: 100% 92% 96% 90%      Constitutional: NAD, calm, comfortable Eyes: PERTLA, lids and conjunctivae normal ENMT: Mucous membranes are moist. Posterior pharynx clear of any exudate or lesions. Normal dentition. - abrasions on forehead and nose from fall Neck:  normal, supple, no masses, no thyromegaly Respiratory: mild rhonchi, no wheezing,I have taken her off the Bipap. Respiration are in the 20s. Pulse ox is 92% on 2 L. no crackles. No accessory muscle use.  Cardiovascular: S1 & S2 heard, regular rate and rhythm, no murmurs / rubs / gallops. No extremity edema. 2+ pedal pulses. No carotid bruits.  Abdomen: No distension, no tenderness, no masses palpated. No hepatosplenomegaly. Bowel sounds normal.  Musculoskeletal: no clubbing / cyanosis. No joint deformity upper and lower extremities. Good ROM, no contractures. Normal muscle tone.  Skin: no rashes, lesions, ulcers. No induration Neurologic: CN 2-12 grossly intact. Sensation intact, DTR normal. Strength 5/5 in all 4 limbs.  Psychiatric: Normal judgment and insight. Alert and oriented x 3. Normal mood.     Labs on Admission: I have personally reviewed following labs and imaging studies  CBC: Recent Labs  Lab 02/06/17 0652  WBC 15.4*  NEUTROABS 13.5*  HGB 13.8  HCT 42.6  MCV 90.6  PLT 616   Basic Metabolic Panel: Recent Labs  Lab 02/06/17 0652  NA 136  K 3.6  CL 98*  CO2 25  GLUCOSE 153*  BUN 14  CREATININE 0.84  CALCIUM 9.0   GFR: Estimated Creatinine Clearance: 50 mL/min (by C-G formula based on SCr of 0.84 mg/dL). Liver Function Tests: Recent Labs  Lab 02/06/17 0652  AST 32  ALT 16  ALKPHOS 72  BILITOT 1.5*  PROT 8.1  ALBUMIN 4.8   No results for input(s): LIPASE, AMYLASE in the last 168 hours. No results for input(s): AMMONIA in the last 168 hours. Coagulation Profile: Recent Labs  Lab 02/06/17 0652  INR 2.38   Cardiac Enzymes: No results for input(s): CKTOTAL, CKMB, CKMBINDEX, TROPONINI in the last 168 hours. BNP (last 3 results) Recent Labs    01/23/17 1156  PROBNP 2,015*   HbA1C: No results for input(s):  HGBA1C in the last 72 hours. CBG: No results for input(s): GLUCAP in the last 168 hours. Lipid Profile: No results for input(s): CHOL, HDL,  LDLCALC, TRIG, CHOLHDL, LDLDIRECT in the last 72 hours. Thyroid Function Tests: No results for input(s): TSH, T4TOTAL, FREET4, T3FREE, THYROIDAB in the last 72 hours. Anemia Panel: No results for input(s): VITAMINB12, FOLATE, FERRITIN, TIBC, IRON, RETICCTPCT in the last 72 hours. Urine analysis:    Component Value Date/Time   COLORURINE YELLOW 07/31/2016 2106   APPEARANCEUR HAZY (A) 07/31/2016 2106   LABSPEC 1.012 07/31/2016 2106   PHURINE 5.0 07/31/2016 2106   GLUCOSEU NEGATIVE 07/31/2016 2106   GLUCOSEU NEGATIVE 02/28/2013 1235   HGBUR SMALL (A) 07/31/2016 2106   BILIRUBINUR NEGATIVE 07/31/2016 2106   KETONESUR NEGATIVE 07/31/2016 2106   PROTEINUR NEGATIVE 07/31/2016 2106   UROBILINOGEN 0.2 02/28/2013 1235   NITRITE NEGATIVE 07/31/2016 2106   LEUKOCYTESUR LARGE (A) 07/31/2016 2106   Sepsis Labs: @LABRCNTIP (procalcitonin:4,lacticidven:4) )No results found for this or any previous visit (from the past 240 hour(s)).   Radiological Exams on Admission: Dg Wrist Complete Right  Result Date: 02/06/2017 CLINICAL DATA:  Fall.  Right-sided wrist pain. EXAM: RIGHT WRIST - COMPLETE 3+ VIEW COMPARISON:  None. FINDINGS: Advanced degenerative changes are again seen in the right wrist. Subluxation of the first [facility] joint is again noted. There is soft tissue swelling about the wrist. No acute fracture is evident. Vascular calcifications are noted. IMPRESSION: 1. Soft tissue swelling about the right wrist without acute fracture. 2. Advanced degenerative changes. 3. Atherosclerosis. Electronically Signed   By: San Morelle M.D.   On: 02/06/2017 08:00   Ct Head Wo Contrast  Result Date: 02/06/2017 CLINICAL DATA:  Fall, on Coumadin, forehead abrasion, initial encounter. EXAM: CT HEAD WITHOUT CONTRAST CT CERVICAL SPINE WITHOUT CONTRAST TECHNIQUE: Multidetector CT imaging of the head and cervical spine was performed following the standard protocol without intravenous contrast. Multiplanar CT  image reconstructions of the cervical spine were also generated. COMPARISON:  07/31/2016. FINDINGS: CT HEAD FINDINGS Brain: No evidence of an acute infarct, acute hemorrhage, mass lesion, mass effect or hydrocephalus. Atrophy and confluent periventricular low attenuation. Ventricular prominence is unchanged and in proportion to the degree of atrophy. Vascular: No hyperdense vessel or unexpected calcification. Skull: Normal. Negative for fracture or focal lesion. Sinuses/Orbits: No acute finding. Other: None. CT CERVICAL SPINE FINDINGS Alignment: Image quality is degraded by motion. Reversal of the normal cervical lordosis, centered at C6. Skull base and vertebrae: Image quality is degraded by motion. No definite fracture. Soft tissues and spinal canal: Image quality is degraded by motion. No definite acute finding. Disc levels: Image quality is degraded by motion. Multilevel facet hypertrophy. Endplate degenerative changes are seen anteriorly from C4-5 to C7-T1. Upper chest: Image quality is degraded by motion. Biapical pleuroparenchymal scarring. Other: None. IMPRESSION: 1. No acute intracranial abnormality. 2. Cervical spine images are degraded by motion. No definite fracture or subluxation. 3. Brain atrophy and chronic microvascular white matter ischemic changes. 4. Reversal of the normal cervical lordosis with degenerative disc disease. Electronically Signed   By: Lorin Picket M.D.   On: 02/06/2017 07:54   Ct Cervical Spine Wo Contrast  Result Date: 02/06/2017 CLINICAL DATA:  Fall, on Coumadin, forehead abrasion, initial encounter. EXAM: CT HEAD WITHOUT CONTRAST CT CERVICAL SPINE WITHOUT CONTRAST TECHNIQUE: Multidetector CT imaging of the head and cervical spine was performed following the standard protocol without intravenous contrast. Multiplanar CT image reconstructions of the cervical spine were also generated. COMPARISON:  07/31/2016.  FINDINGS: CT HEAD FINDINGS Brain: No evidence of an acute  infarct, acute hemorrhage, mass lesion, mass effect or hydrocephalus. Atrophy and confluent periventricular low attenuation. Ventricular prominence is unchanged and in proportion to the degree of atrophy. Vascular: No hyperdense vessel or unexpected calcification. Skull: Normal. Negative for fracture or focal lesion. Sinuses/Orbits: No acute finding. Other: None. CT CERVICAL SPINE FINDINGS Alignment: Image quality is degraded by motion. Reversal of the normal cervical lordosis, centered at C6. Skull base and vertebrae: Image quality is degraded by motion. No definite fracture. Soft tissues and spinal canal: Image quality is degraded by motion. No definite acute finding. Disc levels: Image quality is degraded by motion. Multilevel facet hypertrophy. Endplate degenerative changes are seen anteriorly from C4-5 to C7-T1. Upper chest: Image quality is degraded by motion. Biapical pleuroparenchymal scarring. Other: None. IMPRESSION: 1. No acute intracranial abnormality. 2. Cervical spine images are degraded by motion. No definite fracture or subluxation. 3. Brain atrophy and chronic microvascular white matter ischemic changes. 4. Reversal of the normal cervical lordosis with degenerative disc disease. Electronically Signed   By: Lorin Picket M.D.   On: 02/06/2017 07:54   Dg Chest Port 1 View  Result Date: 02/06/2017 CLINICAL DATA:  Shortness of breath with fall EXAM: PORTABLE CHEST 1 VIEW COMPARISON:  January 03, 2017 FINDINGS: There is no edema or consolidation. Lungs are mildly hyperexpanded. Heart is upper normal in size with pulmonary vascularity within normal limits. Patient is status post internal mammary bypass grafting. There is aortic atherosclerosis. No adenopathy. No evident bone lesions. No pneumothorax. IMPRESSION: Aortic atherosclerosis. No edema or consolidation. Stable cardiac silhouette. Aortic Atherosclerosis (ICD10-I70.0). Electronically Signed   By: Lowella Grip III M.D.   On: 02/06/2017  08:00       Assessment/Plan Principal Problem:   Acute respiratory distress   Bronchitis with hypoxia   H/o COPD - suspect viral infection causing reactive airways and hypoxia - CXR clear, no fevers, normal WBC count - check Influenza and Resp virus panel - keep in SDU, may need to go back on BiPAP- NPO except meds and ice chips for today - she has had strep pneumonia in the past- check strep swab- can start Levaquin for now in case this is bacterial- if sputum is clear and if viral etiology is found, would stop antibiotics or limit course to only 5 days- per daughter who is a nurse the patient does not have any confusion from Flouroquinolones  - ADDENDUM: pharmacy recommends Rocephin due to Levaquin's interaction with Coumadin- if she is later found to have a pneumonia, will need to add Azithromycin to cover Atypical organisms - no wheezing currently but this may have resolved from steroids and nebs- she has a h/o COPD per chart and per PFTs but patient states that she rarely has exacerbations  Active Problems:  Severe  Sepsis- tachycardia, tachypnea with above infection which may be viral with lactic acidosis - likely due to work of breathing- repeat - Antibiotics as mentioned above - daughter asking for blood cultures- will go ahead and order this but my suspicion for a bacteremia is low    Atrial fibrillation  - cont Coumadin and Metoprolol - rate controlled    Chronic diastolic heart failure / HTN - no exacerbation - cont current dose of diuretics- she took Lasix this AM and will next be given tomorrow - cont ARB, Norvasc, B Blocker    Hypothyroidism - Synthroid  Hemoccult + - on Coumadin, Plavix, Celebrex - the patient has not  noted any bloody stools at home and Hb is normal   - with current respiratory issues, she is not a candidate for any sort of GI procedures - PCP will need to f/u on hemoccult as outpt- Hb is stable and there is no immediate urgency to pursue a  procedure in the hospital  - repeat hemoccult  - consider PPI if Hemoccult still +    Dementia - Namenda, Aricept  - NOTE: prior diagnosis in progress notes mention confusion secondary to steroids- may need to d/c steroids or do a rapid wean based on necessity if delirium occurs  H/o CVA with fall on admission - has trouble walking and needs a brace - PT eval ordered to assist with ambulation  H/o CAD/ CABG - cont Plavix  Gout - Allopurinol    DVT prophylaxis: Coumadin Code Status: DNR per patient and daughter  Family Communication: daughter at bedside  Disposition Plan: SDU  Consults called: none  Admission status: inpatient    Debbe Odea MD [facility] Pager: [email or web address] Password [email or web address] [phone], please contact night-coverage   02/06/2017, 10:30 AM

## 2017-02-06 NOTE — Evaluation (Signed)
Physical Therapy Evaluation Patient Details Name: Amber [name] MRN: [id number] DOB: 1934-05-27 Today's Date: 02/06/2017   History of Present Illness  82 y.o. female admitted on 02/06/17 for SOB, fall at home. dx with acute respiratory distress, bronchitis with hypoxia (h/o COPD) with suspected viral infection (culture results pending). Pt also with severe sepsis, tachycardia, tachypnea, lactic acidosis.  Pt with significant PMH of memory issues, HTN, CVA with left sided weakness, fibromyalgia, dementia, CAD, COPD, chronic diastolic CHF, cervical spine stenosis, A-fib on coumadin, and CABG.    Clinical Impression  Pt with resting DOE of 3/4 on O2 [place] and RR into the 30s.  She is struggling to talk with me and maintain her breathing stability.  RN reports she did not do well with just rolling to get on the bed pan. Hx taken from daughter who was there in the room.  Pt does have h/o left sided weakness from previous stroke and is likely weaker from acute illness.  Based on today's presentation I did not feel safe to mobilize her to EOB as I did not want to send her into more distress.  I will do my best to get a college to come by to check on her tomorrow.  If her current presentation does not improve, she will not be safe to return home and may need short term post acute rehab.  PT to follow acutely for deficits listed below.       Follow Up Recommendations SNF;Other (comment);Supervision/Assistance - 24 hour(hopeful for progression to home)    Equipment Recommendations  None recommended by PT    Recommendations for Other Services OT consult     Precautions / Restrictions Precautions Precautions: Fall Precaution Comments: recent h/o falls Required Braces or Orthoses: Other Brace/Splint Other Brace/Splint: left leg bil upright AFO Restrictions Weight Bearing Restrictions: No      Mobility               Pertinent Vitals/Pain Pain Assessment: Faces Faces Pain Scale: Hurts even  more Pain Location: generalized bruising and carpet burn from most recent fall Pain Descriptors / Indicators: Grimacing;Guarding Pain Intervention(s): Limited activity within patient's tolerance;Monitored during session;Repositioned    Home Living Family/patient expects to be discharged to:: Private residence Living Arrangements: Spouse/significant other Available Help at Discharge: Family Type of Home: House         Home Equipment: Environmental consultant - 4 wheels;Electric scooter;Other (comment)(of note, electric scooter doesn't fit through doors) Additional Comments: Per daughter, she sleeps in the recliner chair in a different room from her husband and fell trying to get to the bathroom with her rollator.  She does not put her brace on at night to get up and go to the bathroom.     Prior Function Level of Independence: Needs assistance   Gait / Transfers Assistance Needed: uses rollator at baseline.           Hand Dominance   Dominant Hand: Right    Extremity/Trunk Assessment   Upper Extremity Assessment Upper Extremity Assessment: RUE deficits/detail;LUE deficits/detail RUE Deficits / Details: Pt is generally weak.  She has very arthritic hands bil and they are very sore to touch.  She also had some trauma to her left hand 4th finger when she fell. Per daughter report she has been given utensils with the large foam grips to eat, but doesn't use them.  LUE Deficits / Details: Pt is generally weak.  She has very arthritic hands bil and they are very sore to touch.  She also had some trauma to her left hand 4th finger when she fell.     Lower Extremity Assessment Lower Extremity Assessment: LLE deficits/detail LLE Deficits / Details: left leg with history of stroke, ankle is inverted (likely weak peroneals/eversion), but she does have 2/5 DF, PF, weak knee flex/ext 3-/5 and hip flexion supine in bed against gravity is 3-/5.  Right leg is stronger than left, but still generally weak. at  least 3/5 per bed level assessment.  LLE Sensation: decreased light touch(due to h/o stroke, but could localize LT)       Communication   Communication: No difficulties  Cognition Arousal/Alertness: Awake/alert Behavior During Therapy: WFL for tasks assessed/performed Overall Cognitive Status: History of cognitive impairments - at baseline                                 General Comments: Pt seems more confused than baseline.  She asked her daughter how many days she has been in the hospital and today is her first day here.       General Comments General comments (skin integrity, edema, etc.): RR into the mid 30s at rest, pt using accessory muscles to breathe, Pt on [place] O2 and was struggling to breathe while talking, so hx taken from daughter.  RN reports that she struggled to tolerate just rolling to get on the bed pan, so EOB mobility deferred at this time.          Assessment/Plan    PT Assessment Patient needs continued PT services  PT Problem List Decreased strength;Decreased range of motion;Decreased activity tolerance;Decreased balance;Decreased mobility;Decreased knowledge of use of DME;Decreased cognition;Decreased safety awareness;Decreased knowledge of precautions;Cardiopulmonary status limiting activity       PT Treatment Interventions DME instruction;Gait training;Stair training;Functional mobility training;Therapeutic exercise;Therapeutic activities;Balance training;Neuromuscular re-education;Cognitive remediation;Patient/family education    PT Goals (Current goals can be found in the Care Plan section)  Acute Rehab PT Goals Patient Stated Goal: daughter wants her to breathe better and stop falling PT Goal Formulation: With patient/family Time For Goal Achievement: 02/20/17 Potential to Achieve Goals: Good    Frequency Min 3X/week           AM-PAC PT "6 Clicks" Daily Activity  Outcome Measure Difficulty turning over in bed (including adjusting  bedclothes, sheets and blankets)?: Unable Difficulty moving from lying on back to sitting on the side of the bed? : Unable Difficulty sitting down on and standing up from a chair with arms (e.g., wheelchair, bedside commode, etc,.)?: Unable Help needed moving to and from a bed to chair (including a wheelchair)?: Total Help needed walking in hospital room?: Total Help needed climbing 3-5 steps with a railing? : Total 6 Click Score: 6    End of Session Equipment Utilized During Treatment: Oxygen Activity Tolerance: Other (comment)(limited by SOB/WOB) Patient left: in bed;with call bell/phone within reach;with family/visitor present   PT Visit Diagnosis: Muscle weakness (generalized) (M62.81);Difficulty in walking, not elsewhere classified (R26.2);History of falling (Z91.81)    Time: 1194-1740 PT Time Calculation (min) (ACUTE ONLY): 18 min   Charges:        Wells Guiles B. Neithan Day, PT, DPT [phone]   PT Evaluation $PT Eval Moderate Complexity: 1 Mod     02/06/2017, 2:01 PM

## 2017-02-06 NOTE — ED Notes (Signed)
ED TO INPATIENT HANDOFF REPORT  Name/Age/Gender Amber [name] 82 y.o. female  Code Status    Code Status Orders  (From admission, onward)        Start     Ordered   02/06/17 1002  Do not attempt resuscitation (DNR)  Continuous    Question Answer Comment  In the event of cardiac or respiratory ARREST Do not call a "code blue"   In the event of cardiac or respiratory ARREST Do not perform Intubation, CPR, defibrillation or ACLS   In the event of cardiac or respiratory ARREST Use medication by any route, position, wound care, and other measures to relive pain and suffering. May use oxygen, suction and manual treatment of airway obstruction as needed for comfort.      02/06/17 1003    Code Status History    Date Active Date Inactive Code Status Order ID Comments User Context   01/02/2017 00:42 01/03/2017 19:01 Full Code [id number]  Oswald Hillock, MD Inpatient   01/02/2017 00:24 01/02/2017 00:42 DNR [id number]  Oswald Hillock, MD Inpatient   07/27/2014 16:10 07/30/2014 12:41 Full Code [id number]  Luanne Bras, MD Inpatient   06/30/2014 14:21 07/01/2014 03:16 Full Code [id number]  Luanne Bras, MD HOV   02/13/2013 01:11 02/19/2013 14:18 Full Code [id number]  Toy Baker, MD Inpatient      Home/SNF/Other Home  Chief Complaint SOB  Level of Care/Admitting Diagnosis ED Disposition    ED Disposition Condition Comment   Admit  Hospital Area: [facility] [[id number]]  Level of Care: Stepdown [14]  Admit to SDU based on following criteria: Respiratory Distress:  Frequent assessment and/or intervention to maintain adequate ventilation/respiration, pulmonary toilet, and respiratory treatment.  Diagnosis: Acute respiratory distress [412878]  Admitting Physician: [place], [place]  Attending Physician: Debbe Odea [[id number]]  Estimated length of stay: past midnight tomorrow  Certification:: I certify this patient will need inpatient services for at least 2  midnights  PT Class (Do Not Modify): Inpatient [101]  PT Acc Code (Do Not Modify): Private [1]       Medical History Past Medical History:  Diagnosis Date  . Abnormality of gait 08/02/2014  . Atrial fibrillation ([place])    a. chronic/rate controlled;  b. chronic coumadin.  [name] Cerebrovascular disease 05/21/2016  . Cervical spinal stenosis   . Chronic diastolic CHF (congestive heart failure) ([place])    a. 02/2013 Echo: EF 55-60%, mild AI, mod dil LA.  [name] COPD with emphysema ([place])    PFT 05/02/10>>FEV1 1.35(62%), FEV1% 66, DLCO 75%  . Coronary artery disease    a. s/p CABG;  b. 04/2010 Neg MV  . CVA (cerebral vascular accident) ([place])    left sided weakness  . Dementia   . Depression   . Fibromyalgia   . GERD (gastroesophageal reflux disease)    pepcid 2-3 times per week  . Gout   . Hemiparesis and alteration of sensations as late effects of stroke ([place]) 01/17/2015  . History of melanoma    squamous cell, melanoma  . Hyperlipidemia   . Hypertension   . Hypothyroidism   . Insomnia   . Intermittent confusion   . Memory change 01/23/2014  . Nasal polyposis   . Osteoarthritis   . Osteoporosis   . Pneumonia    1990    Allergies Allergies  Allergen Reactions  . Ezetimibe Other (See Comments)    Myalgia  . Welchol [Colesevelam Hcl] Other (See Comments)    Muscle aches  .  Adhesive [Tape] Itching, Rash and Other (See Comments)    Burning   . Ceclor [Cefaclor] Other (See Comments)    Unknown allergic reaction  . Elastic Bandages & [Zinc] Rash and Other (See Comments)    Turns red on the areas it touches  . Latex Itching, Rash and Other (See Comments)    Burning   . Lipitor [Atorvastatin Calcium] Other (See Comments)    Increased fibromyalgia pain  . Mevacor [Lovastatin] Other (See Comments)    Increased fibromyalgia pain  . Pravachol Other (See Comments)    Increased fibromyalgia pain  . Vasotec Other (See Comments)    Unknown allergic reaction    IV  Location/Drains/Wounds Patient Lines/Drains/Airways Status   Active Line/Drains/Airways    Name:   Placement date:   Placement time:   Site:   Days:   Peripheral IV 02/06/17 Right Hand   02/06/17    0718    Hand   less than 1   Incision (Closed) 06/30/14 Groin Right   06/30/14    1333     952   Incision (Closed) 07/27/14 Groin Right   07/27/14    1345     925          Labs/Imaging Results for orders placed or performed during the hospital encounter of 02/06/17 (from the past 48 hour(s))  Comprehensive metabolic panel     Status: Abnormal   Collection Time: 02/06/17  6:52 AM  Result Value Ref Range   Sodium 136 135 - 145 mmol/L   Potassium 3.6 3.5 - 5.1 mmol/L   Chloride 98 (L) 101 - 111 mmol/L   CO2 25 22 - 32 mmol/L   Glucose, Bld 153 (H) 65 - 99 mg/dL   BUN 14 6 - 20 mg/dL   Creatinine, Ser 0.84 0.44 - 1.00 mg/dL   Calcium 9.0 8.9 - 10.3 mg/dL   Total Protein 8.1 6.5 - 8.1 g/dL   Albumin 4.8 3.5 - 5.0 g/dL   AST 32 15 - 41 U/L   ALT 16 14 - 54 U/L   Alkaline Phosphatase 72 38 - 126 U/L   Total Bilirubin 1.5 (H) 0.3 - 1.2 mg/dL   GFR calc non Af Amer >60 >60 mL/min   GFR calc Af Amer >60 >60 mL/min    Comment: (NOTE) The eGFR has been calculated using the CKD EPI equation. This calculation has not been validated in all clinical situations. eGFR's persistently <60 mL/min signify possible Chronic Kidney Disease.    Anion gap 13 5 - 15  Brain natriuretic peptide     Status: Abnormal   Collection Time: 02/06/17  6:52 AM  Result Value Ref Range   B Natriuretic Peptide 245.0 (H) 0.0 - 100.0 pg/mL  CBC with Differential     Status: Abnormal   Collection Time: 02/06/17  6:52 AM  Result Value Ref Range   WBC 15.4 (H) 4.0 - 10.5 K/uL   RBC 4.70 3.87 - 5.11 MIL/uL   Hemoglobin 13.8 12.0 - 15.0 g/dL   HCT 42.6 36.0 - 46.0 %   MCV 90.6 78.0 - 100.0 fL   MCH 29.4 26.0 - 34.0 pg   MCHC 32.4 30.0 - 36.0 g/dL   RDW 15.0 11.5 - 15.5 %   Platelets 226 150 - 400 K/uL    Neutrophils Relative % 88 %   Neutro Abs 13.5 (H) 1.7 - 7.7 K/uL   Lymphocytes Relative 6 %   Lymphs Abs 0.9 0.7 - 4.0 K/uL  Monocytes Relative 6 %   Monocytes Absolute 1.0 0.1 - 1.0 K/uL   Eosinophils Relative 0 %   Eosinophils Absolute 0.0 0.0 - 0.7 K/uL   Basophils Relative 0 %   Basophils Absolute 0.0 0.0 - 0.1 K/uL  Protime-INR     Status: Abnormal   Collection Time: 02/06/17  6:52 AM  Result Value Ref Range   Prothrombin Time 25.8 (H) 11.4 - 15.2 seconds   INR 2.38   Digoxin level     Status: Abnormal   Collection Time: 02/06/17  6:52 AM  Result Value Ref Range   Digoxin Level 0.2 (L) 0.8 - 2.0 ng/mL  I-stat troponin, ED     Status: None   Collection Time: 02/06/17  7:04 AM  Result Value Ref Range   Troponin i, poc 0.00 0.00 - 0.08 ng/mL   Comment 3            Comment: Due to the release kinetics of cTnI, a negative result within the first hours of the onset of symptoms does not rule out myocardial infarction with certainty. If myocardial infarction is still suspected, repeat the test at appropriate intervals.   I-Stat CG4 Lactic Acid, ED     Status: Abnormal   Collection Time: 02/06/17  7:06 AM  Result Value Ref Range   Lactic Acid, Venous 2.52 (HH) 0.5 - 1.9 mmol/L   Comment NOTIFIED PHYSICIAN   Blood gas, venous     Status: None   Collection Time: 02/06/17  7:15 AM  Result Value Ref Range   pH, Ven 7.352 7.250 - 7.430   pCO2, Ven 50.3 44.0 - 60.0 mmHg   pO2, Ven 41.6 32.0 - 45.0 mmHg   Bicarbonate 27.2 20.0 - 28.0 mmol/L   Acid-Base Excess 1.4 0.0 - 2.0 mmol/L   O2 Saturation 74.8 %   Patient temperature 98.6    Collection site VENOUS    Sample type VENOUS   POC occult blood, ED Provider will collect     Status: Abnormal   Collection Time: 02/06/17  7:18 AM  Result Value Ref Range   Fecal Occult Bld POSITIVE (A) NEGATIVE   Dg Wrist Complete Right  Result Date: 02/06/2017 CLINICAL DATA:  Fall.  Right-sided wrist pain. EXAM: RIGHT WRIST - COMPLETE 3+  VIEW COMPARISON:  None. FINDINGS: Advanced degenerative changes are again seen in the right wrist. Subluxation of the first [facility] joint is again noted. There is soft tissue swelling about the wrist. No acute fracture is evident. Vascular calcifications are noted. IMPRESSION: 1. Soft tissue swelling about the right wrist without acute fracture. 2. Advanced degenerative changes. 3. Atherosclerosis. Electronically Signed   By: San Morelle M.D.   On: 02/06/2017 08:00   Ct Head Wo Contrast  Result Date: 02/06/2017 CLINICAL DATA:  Fall, on Coumadin, forehead abrasion, initial encounter. EXAM: CT HEAD WITHOUT CONTRAST CT CERVICAL SPINE WITHOUT CONTRAST TECHNIQUE: Multidetector CT imaging of the head and cervical spine was performed following the standard protocol without intravenous contrast. Multiplanar CT image reconstructions of the cervical spine were also generated. COMPARISON:  07/31/2016. FINDINGS: CT HEAD FINDINGS Brain: No evidence of an acute infarct, acute hemorrhage, mass lesion, mass effect or hydrocephalus. Atrophy and confluent periventricular low attenuation. Ventricular prominence is unchanged and in proportion to the degree of atrophy. Vascular: No hyperdense vessel or unexpected calcification. Skull: Normal. Negative for fracture or focal lesion. Sinuses/Orbits: No acute finding. Other: None. CT CERVICAL SPINE FINDINGS Alignment: Image quality is degraded by motion. Reversal of the  normal cervical lordosis, centered at C6. Skull base and vertebrae: Image quality is degraded by motion. No definite fracture. Soft tissues and spinal canal: Image quality is degraded by motion. No definite acute finding. Disc levels: Image quality is degraded by motion. Multilevel facet hypertrophy. Endplate degenerative changes are seen anteriorly from C4-5 to C7-T1. Upper chest: Image quality is degraded by motion. Biapical pleuroparenchymal scarring. Other: None. IMPRESSION: 1. No acute intracranial  abnormality. 2. Cervical spine images are degraded by motion. No definite fracture or subluxation. 3. Brain atrophy and chronic microvascular white matter ischemic changes. 4. Reversal of the normal cervical lordosis with degenerative disc disease. Electronically Signed   By: Lorin Picket M.D.   On: 02/06/2017 07:54   Ct Cervical Spine Wo Contrast  Result Date: 02/06/2017 CLINICAL DATA:  Fall, on Coumadin, forehead abrasion, initial encounter. EXAM: CT HEAD WITHOUT CONTRAST CT CERVICAL SPINE WITHOUT CONTRAST TECHNIQUE: Multidetector CT imaging of the head and cervical spine was performed following the standard protocol without intravenous contrast. Multiplanar CT image reconstructions of the cervical spine were also generated. COMPARISON:  07/31/2016. FINDINGS: CT HEAD FINDINGS Brain: No evidence of an acute infarct, acute hemorrhage, mass lesion, mass effect or hydrocephalus. Atrophy and confluent periventricular low attenuation. Ventricular prominence is unchanged and in proportion to the degree of atrophy. Vascular: No hyperdense vessel or unexpected calcification. Skull: Normal. Negative for fracture or focal lesion. Sinuses/Orbits: No acute finding. Other: None. CT CERVICAL SPINE FINDINGS Alignment: Image quality is degraded by motion. Reversal of the normal cervical lordosis, centered at C6. Skull base and vertebrae: Image quality is degraded by motion. No definite fracture. Soft tissues and spinal canal: Image quality is degraded by motion. No definite acute finding. Disc levels: Image quality is degraded by motion. Multilevel facet hypertrophy. Endplate degenerative changes are seen anteriorly from C4-5 to C7-T1. Upper chest: Image quality is degraded by motion. Biapical pleuroparenchymal scarring. Other: None. IMPRESSION: 1. No acute intracranial abnormality. 2. Cervical spine images are degraded by motion. No definite fracture or subluxation. 3. Brain atrophy and chronic microvascular white matter  ischemic changes. 4. Reversal of the normal cervical lordosis with degenerative disc disease. Electronically Signed   By: Lorin Picket M.D.   On: 02/06/2017 07:54   Dg Chest Port 1 View  Result Date: 02/06/2017 CLINICAL DATA:  Shortness of breath with fall EXAM: PORTABLE CHEST 1 VIEW COMPARISON:  January 03, 2017 FINDINGS: There is no edema or consolidation. Lungs are mildly hyperexpanded. Heart is upper normal in size with pulmonary vascularity within normal limits. Patient is status post internal mammary bypass grafting. There is aortic atherosclerosis. No adenopathy. No evident bone lesions. No pneumothorax. IMPRESSION: Aortic atherosclerosis. No edema or consolidation. Stable cardiac silhouette. Aortic Atherosclerosis (ICD10-I70.0). Electronically Signed   By: Lowella Grip III M.D.   On: 02/06/2017 08:00    Pending Labs Unresulted Labs (From admission, onward)   Start     Ordered   02/13/17 0500  Creatinine, serum  (enoxaparin (LOVENOX)    CrCl >/= 30 ml/min)  Weekly,   R    Comments:  while on enoxaparin therapy    02/06/17 1003   02/07/17 7116  Basic metabolic panel  Tomorrow morning,   R     02/06/17 1003   02/07/17 0500  CBC  Tomorrow morning,   R     02/06/17 1003   02/06/17 1034  Rapid Strep Screen (Not at [facility])  STAT,   R     02/06/17 1033   02/06/17 1009  Lactic acid, plasma  Once,   R     02/06/17 1008   02/06/17 1001  CBC  (enoxaparin (LOVENOX)    CrCl >/= 30 ml/min)  Once,   R    Comments:  Baseline for enoxaparin therapy IF NOT ALREADY DRAWN.  Notify MD if PLT < 100 K.    02/06/17 1003   02/06/17 1001  Creatinine, serum  (enoxaparin (LOVENOX)    CrCl >/= 30 ml/min)  Once,   R    Comments:  Baseline for enoxaparin therapy IF NOT ALREADY DRAWN.    02/06/17 1003   02/06/17 1000  Respiratory Panel by PCR  (Respiratory virus panel)  Once,   R     02/06/17 1000   02/06/17 0958  Culture, blood (Routine X 2) w Reflex to ID Panel  BLOOD CULTURE X 2,   R     02/06/17  0957   02/06/17 0827  Influenza panel by PCR (type A & B)  STAT,   STAT     02/06/17 0826      Vitals/Pain Today's Vitals   02/06/17 0836 02/06/17 0917 02/06/17 1000 02/06/17 1038  BP: (!) 140/56 (!) 140/56 (!) 147/96 (!) 147/96  Pulse: 71 83 88 100  Resp: (!) 28 (!) 21 (!) 25 16  Temp:      TempSrc:      SpO2: 92% 96% 90% 92%    Isolation Precautions Droplet precaution  Medications Medications  levofloxacin (LEVAQUIN) IVPB 750 mg (not administered)  enoxaparin (LOVENOX) injection 40 mg (not administered)  sodium chloride flush (NS) 0.9 % injection 3 mL (not administered)  sodium chloride flush (NS) 0.9 % injection 3 mL (not administered)  0.9 %  sodium chloride infusion (not administered)  acetaminophen (TYLENOL) tablet 650 mg (not administered)    Or  acetaminophen (TYLENOL) suppository 650 mg (not administered)  ondansetron (ZOFRAN) tablet 4 mg (not administered)    Or  ondansetron (ZOFRAN) injection 4 mg (not administered)  allopurinol (ZYLOPRIM) tablet 300 mg (not administered)  amitriptyline (ELAVIL) tablet 20 mg (not administered)  amLODipine (NORVASC) tablet 5 mg (not administered)  celecoxib (CELEBREX) capsule 200 mg (not administered)  citalopram (CELEXA) tablet 20 mg (not administered)  clopidogrel (PLAVIX) tablet 75 mg (not administered)  warfarin (COUMADIN) tablet 5-7.5 mg (not administered)  digoxin (LANOXIN) tablet 0.125 mg (not administered)  donepezil (ARICEPT) tablet 10 mg (not administered)  furosemide (LASIX) tablet 40 mg (not administered)  potassium chloride SA (K-DUR,KLOR-CON) CR tablet 20 mEq (not administered)  levothyroxine (SYNTHROID, LEVOTHROID) tablet 25 mcg (not administered)  losartan (COZAAR) tablet 50 mg (not administered)  memantine (NAMENDA XR) 24 hr capsule 28 mg (not administered)  metoprolol succinate (TOPROL-XL) 24 hr tablet 25 mg (not administered)  ipratropium-albuterol (DUONEB) 0.5-2.5 (3) MG/3ML nebulizer solution 3 mL (not  administered)  albuterol (PROVENTIL) (2.5 MG/3ML) 0.083% nebulizer solution 2.5 mg (not administered)  benzonatate (TESSALON) capsule 100 mg (not administered)  guaiFENesin (MUCINEX) 12 hr tablet 600 mg (not administered)  methylPREDNISolone sodium succinate (SOLU-MEDROL) 40 mg/mL injection 40 mg (not administered)  Tdap (BOOSTRIX) injection 0.5 mL (0.5 mLs Intramuscular Given 02/06/17 0917)  ipratropium-albuterol (DUONEB) 0.5-2.5 (3) MG/3ML nebulizer solution 3 mL (3 mLs Nebulization Given 02/06/17 0739)  methylPREDNISolone sodium succinate (SOLU-MEDROL) 125 mg/2 mL injection 125 mg (125 mg Intravenous Given 02/06/17 0922)    Mobility

## 2017-02-07 DIAGNOSIS — R0603 Acute respiratory distress: Secondary | ICD-10-CM

## 2017-02-07 DIAGNOSIS — J9601 Acute respiratory failure with hypoxia: Secondary | ICD-10-CM

## 2017-02-07 LAB — BASIC METABOLIC PANEL
ANION GAP: 11 (ref 5–15)
BUN: 18 mg/dL (ref 6–20)
CALCIUM: 8.4 mg/dL — AB (ref 8.9–10.3)
CO2: 24 mmol/L (ref 22–32)
Chloride: 101 mmol/L (ref 101–111)
Creatinine, Ser: 0.86 mg/dL (ref 0.44–1.00)
GFR calc Af Amer: >60 mL/min (ref >60)
GLUCOSE: 184 mg/dL — AB (ref 65–99)
POTASSIUM: 3.4 mmol/L — AB (ref 3.5–5.1)
SODIUM: 136 mmol/L (ref 135–145)

## 2017-02-07 LAB — CBC
HCT: 37 % (ref 36.0–46.0)
HEMOGLOBIN: 12 g/dL (ref 12.0–15.0)
MCH: 29.1 pg (ref 26.0–34.0)
MCHC: 32.4 g/dL (ref 30.0–36.0)
MCV: 89.6 fL (ref 78.0–100.0)
Platelets: 183 10*3/uL (ref 150–400)
RBC: 4.13 MIL/uL (ref 3.87–5.11)
RDW: 15.2 % (ref 11.5–15.5)
WBC: 12.5 10*3/uL — AB (ref 4.0–10.5)

## 2017-02-07 LAB — PROTIME-INR
INR: 2.72
PROTHROMBIN TIME: 28.6 s — AB (ref 11.4–15.2)

## 2017-02-07 MED ORDER — POTASSIUM CHLORIDE CRYS ER 20 MEQ PO TBCR
40.0000 meq | EXTENDED_RELEASE_TABLET | Freq: Once | ORAL | Status: AC
  Administered 2017-02-07: 40 meq via ORAL
  Filled 2017-02-07: qty 2

## 2017-02-07 MED ORDER — DOXYCYCLINE HYCLATE 100 MG PO TABS
100.0000 mg | ORAL_TABLET | Freq: Two times a day (BID) | ORAL | Status: DC
  Administered 2017-02-07 – 2017-02-11 (×10): 100 mg via ORAL
  Filled 2017-02-07 (×11): qty 1

## 2017-02-07 MED ORDER — PANTOPRAZOLE SODIUM 40 MG PO TBEC
40.0000 mg | DELAYED_RELEASE_TABLET | Freq: Two times a day (BID) | ORAL | Status: DC
  Administered 2017-02-07 – 2017-02-17 (×21): 40 mg via ORAL
  Filled 2017-02-07 (×21): qty 1

## 2017-02-07 MED ORDER — PREDNISONE 50 MG PO TABS
50.0000 mg | ORAL_TABLET | Freq: Every day | ORAL | Status: DC
  Administered 2017-02-07 – 2017-02-08 (×2): 50 mg via ORAL
  Filled 2017-02-07: qty 1
  Filled 2017-02-07: qty 2

## 2017-02-07 MED ORDER — AZITHROMYCIN 250 MG PO TABS: 500.0000 mg | ORAL_TABLET | Freq: Every day | ORAL | Status: DC

## 2017-02-07 NOTE — Progress Notes (Signed)
PROGRESS NOTE    Amber [name]  DGU:[id number] DOB: 24-Jan-1935 DOA: 02/06/2017 PCP: Antony Contras, MD     Brief Narrative:  Amber [name] is a 82yo female with past medical history significant for A-fib, COPD (although patient and family does not recall this diagnosis being made), chronic diastolic CHF, CAD s/p CABG, gout, mild dementia who presents from home with dyspnea and a fall. Per her daughter, the patient has had a runny nose, sore throat and a cough for about 2 days. Her grandchildren were sick with the same illness a few days ago. She has become steadily short of breath and this morning was breathing with rapid shallow respirations. The patient has not had a fever or chills. No chest pain. Her cough is productive but she cannot tell if it is clear or green sputum. In the ED, patient was in respiratory distress with respiratory fatigue and was started on BiPAP. CXR was negative for consolidation. She was admitted to stepdown unit for respiratory failure, COPD exacerbation/bronchitis.   Assessment & Plan:   Principal Problem:   Acute respiratory distress Active Problems:   Atrial fibrillation (HCC)   Chronic diastolic heart failure (HCC)   Hypothyroidism   Dementia   Severe sepsis (HCC)   Occult blood in stools   Lactic acidosis  Acute hypoxemic respiratory failure -Required BiPAP initially, currently on nasal cannula O2.  Continue to wean to room air as tolerated  Severe sepsis secondary to acute viral bronchitis, COPD exacerbation -Patient and family do not recall COPD diagnosis, but was recorded in her chart years ago.  Patient was a smoker over 40 years ago.  -Resp PCR panel +rhinovirus -Initially started on Levaquin but due to interaction with Coumadin, was switched to Rocephin.  Pharmacy does not recommend azithromycin due to drug interaction.  Will switch to doxycycline to cover bronchitis and atypical organism -Wean steroids to p.o. Prednisone -Change breathing  treatments -Blood cultures are pending  Atrial fibrillation -Rate well controlled.  Continue Coumadin, metoprolol, digoxin   Chronic diastolic heart failure -Continue home Lasix, metoprolol  HTN -Continue losartan, Norvasc  Hypothyroidism -Continue Synthroid  Hemoccult positive -Has not noted any bloody stools, hemoglobin is normal at this time -Would recommend outpatient follow-up   Dementia -Continue Namenda, Aricept  Frequent falls -CT head: No acute intracranial abnormality -Xray right wrist: Soft tissue swelling about the right wrist without acute fracture. -PT OT  CAD -Continue Plavix   Gout -Continue allopurinol  Hypokalemia -Replace, trend      DVT prophylaxis: Coumadin Code Status: DNR Family Communication: Daughter at bedside Disposition Plan: Has been off BiPAP and progressing well.  Transfer to telemetry today.  Need PT OT.   Consultants:   None  Procedures:   None   Antimicrobials:  Anti-infectives (From admission, onward)   Start     Dose/Rate Route Frequency Ordered Stop   02/07/17 1000  azithromycin (ZITHROMAX) tablet 500 mg  Status:  Discontinued     500 mg Oral Daily 02/07/17 0738 02/07/17 0838   02/07/17 1000  doxycycline (VIBRA-TABS) tablet 100 mg     100 mg Oral Every 12 hours 02/07/17 0838     02/06/17 1400  cefTRIAXone (ROCEPHIN) 1 g in dextrose 5 % 50 mL IVPB  Status:  Discontinued     1 g 100 mL/hr over 30 Minutes Intravenous Every 24 hours 02/06/17 1324 02/07/17 0738   02/06/17 1000  levofloxacin (LEVAQUIN) IVPB 750 mg  Status:  Discontinued     750 mg  100 mL/hr over 90 Minutes Intravenous Every 24 hours 02/06/17 0959 02/06/17 1320       Subjective: Feeling well this morning.  She has been off BiPAP overnight.  Her breathing and respiratory distress has significantly improved per daughter.  Patient's main complaint is productive cough that she cannot cough out.  Denies any fevers or chills, no other  issues.  Objective: Vitals:   02/07/17 0600 02/07/17 0750 02/07/17 0800 02/07/17 0847  BP: (!) 140/125  (!) 144/47   Pulse: 74  72   Resp: (!) 29  19   Temp:  97.8 F (36.6 C)    TempSrc:  Oral    SpO2: 97%  97% 97%  Height:        Intake/Output Summary (Last 24 hours) at 02/07/2017 0859 Last data filed at 02/06/2017 1500 Gross per 24 hour  Intake 200 ml  Output -  Net 200 ml   There were no vitals filed for this visit.  Examination:  General exam: Appears calm and comfortable  Respiratory system: Diffuse wheezes bilaterally. Respiratory effort normal. On [place] O2 Cardiovascular system: S1 & S2 heard, Irreg rhythm, rate well controlled in the 80s. No JVD, murmurs, rubs, gallops or clicks.  Gastrointestinal system: Abdomen is nondistended, soft and nontender. No organomegaly or masses felt. Normal bowel sounds heard. Central nervous system: Alert and oriented. No focal neurological deficits. Extremities: Symmetric  Skin: No rashes, lesions or ulcers Psychiatry: +Hx dementia, very pleasant   Data Reviewed: I have personally reviewed following labs and imaging studies  CBC: Recent Labs  Lab 02/06/17 0652 02/07/17 0322  WBC 15.4* 12.5*  NEUTROABS 13.5*  --   HGB 13.8 12.0  HCT 42.6 37.0  MCV 90.6 89.6  PLT 226 751   Basic Metabolic Panel: Recent Labs  Lab 02/06/17 0652 02/07/17 0322  NA 136 136  K 3.6 3.4*  CL 98* 101  CO2 25 24  GLUCOSE 153* 184*  BUN 14 18  CREATININE 0.84 0.86  CALCIUM 9.0 8.4*   GFR: CrCl cannot be calculated (Unknown ideal weight.). Liver Function Tests: Recent Labs  Lab 02/06/17 0652  AST 32  ALT 16  ALKPHOS 72  BILITOT 1.5*  PROT 8.1  ALBUMIN 4.8   No results for input(s): LIPASE, AMYLASE in the last 168 hours. No results for input(s): AMMONIA in the last 168 hours. Coagulation Profile: Recent Labs  Lab 02/06/17 0652 02/07/17 0322  INR 2.38 2.72   Cardiac Enzymes: No results for input(s): CKTOTAL, CKMB, CKMBINDEX,  TROPONINI in the last 168 hours. BNP (last 3 results) Recent Labs    01/23/17 1156  PROBNP 2,015*   HbA1C: No results for input(s): HGBA1C in the last 72 hours. CBG: No results for input(s): GLUCAP in the last 168 hours. Lipid Profile: No results for input(s): CHOL, HDL, LDLCALC, TRIG, CHOLHDL, LDLDIRECT in the last 72 hours. Thyroid Function Tests: No results for input(s): TSH, T4TOTAL, FREET4, T3FREE, THYROIDAB in the last 72 hours. Anemia Panel: No results for input(s): VITAMINB12, FOLATE, FERRITIN, TIBC, IRON, RETICCTPCT in the last 72 hours. Sepsis Labs: Recent Labs  Lab 02/06/17 0706 02/06/17 1159  LATICACIDVEN 2.52* 2.0*    Recent Results (from the past 240 hour(s))  Respiratory Panel by PCR     Status: Abnormal   Collection Time: 02/06/17 10:00 AM  Result Value Ref Range Status   Adenovirus NOT DETECTED NOT DETECTED Final   Coronavirus 229E NOT DETECTED NOT DETECTED Final   Coronavirus HKU1 NOT DETECTED NOT DETECTED Final  Coronavirus NL63 NOT DETECTED NOT DETECTED Final   Coronavirus OC43 NOT DETECTED NOT DETECTED Final   Metapneumovirus NOT DETECTED NOT DETECTED Final   Rhinovirus / Enterovirus DETECTED (A) NOT DETECTED Final   Influenza A NOT DETECTED NOT DETECTED Final   Influenza B NOT DETECTED NOT DETECTED Final   Parainfluenza Virus 1 NOT DETECTED NOT DETECTED Final   Parainfluenza Virus 2 NOT DETECTED NOT DETECTED Final   Parainfluenza Virus 3 NOT DETECTED NOT DETECTED Final   Parainfluenza Virus 4 NOT DETECTED NOT DETECTED Final   Respiratory Syncytial Virus NOT DETECTED NOT DETECTED Final   Bordetella pertussis NOT DETECTED NOT DETECTED Final   Chlamydophila pneumoniae NOT DETECTED NOT DETECTED Final   Mycoplasma pneumoniae NOT DETECTED NOT DETECTED Final    Comment: Performed at [facility], [place] [address], [place], [place] [address]  MRSA PCR Screening     Status: None   Collection Time: 02/06/17 11:41 AM  Result Value Ref Range Status    MRSA by PCR NEGATIVE NEGATIVE Final    Comment:        The GeneXpert MRSA Assay (FDA approved for NASAL specimens only), is one component of a comprehensive MRSA colonization surveillance program. It is not intended to diagnose MRSA infection nor to guide or monitor treatment for MRSA infections.        Radiology Studies: Dg Wrist Complete Right  Result Date: 02/06/2017 CLINICAL DATA:  Fall.  Right-sided wrist pain. EXAM: RIGHT WRIST - COMPLETE 3+ VIEW COMPARISON:  None. FINDINGS: Advanced degenerative changes are again seen in the right wrist. Subluxation of the first [facility] joint is again noted. There is soft tissue swelling about the wrist. No acute fracture is evident. Vascular calcifications are noted. IMPRESSION: 1. Soft tissue swelling about the right wrist without acute fracture. 2. Advanced degenerative changes. 3. Atherosclerosis. Electronically Signed   By: San Morelle M.D.   On: 02/06/2017 08:00   Ct Head Wo Contrast  Result Date: 02/06/2017 CLINICAL DATA:  Fall, on Coumadin, forehead abrasion, initial encounter. EXAM: CT HEAD WITHOUT CONTRAST CT CERVICAL SPINE WITHOUT CONTRAST TECHNIQUE: Multidetector CT imaging of the head and cervical spine was performed following the standard protocol without intravenous contrast. Multiplanar CT image reconstructions of the cervical spine were also generated. COMPARISON:  07/31/2016. FINDINGS: CT HEAD FINDINGS Brain: No evidence of an acute infarct, acute hemorrhage, mass lesion, mass effect or hydrocephalus. Atrophy and confluent periventricular low attenuation. Ventricular prominence is unchanged and in proportion to the degree of atrophy. Vascular: No hyperdense vessel or unexpected calcification. Skull: Normal. Negative for fracture or focal lesion. Sinuses/Orbits: No acute finding. Other: None. CT CERVICAL SPINE FINDINGS Alignment: Image quality is degraded by motion. Reversal of the normal cervical lordosis, centered at C6. Skull  base and vertebrae: Image quality is degraded by motion. No definite fracture. Soft tissues and spinal canal: Image quality is degraded by motion. No definite acute finding. Disc levels: Image quality is degraded by motion. Multilevel facet hypertrophy. Endplate degenerative changes are seen anteriorly from C4-5 to C7-T1. Upper chest: Image quality is degraded by motion. Biapical pleuroparenchymal scarring. Other: None. IMPRESSION: 1. No acute intracranial abnormality. 2. Cervical spine images are degraded by motion. No definite fracture or subluxation. 3. Brain atrophy and chronic microvascular white matter ischemic changes. 4. Reversal of the normal cervical lordosis with degenerative disc disease. Electronically Signed   By: Lorin Picket M.D.   On: 02/06/2017 07:54   Ct Cervical Spine Wo Contrast  Result Date: 02/06/2017 CLINICAL  DATA:  Fall, on Coumadin, forehead abrasion, initial encounter. EXAM: CT HEAD WITHOUT CONTRAST CT CERVICAL SPINE WITHOUT CONTRAST TECHNIQUE: Multidetector CT imaging of the head and cervical spine was performed following the standard protocol without intravenous contrast. Multiplanar CT image reconstructions of the cervical spine were also generated. COMPARISON:  07/31/2016. FINDINGS: CT HEAD FINDINGS Brain: No evidence of an acute infarct, acute hemorrhage, mass lesion, mass effect or hydrocephalus. Atrophy and confluent periventricular low attenuation. Ventricular prominence is unchanged and in proportion to the degree of atrophy. Vascular: No hyperdense vessel or unexpected calcification. Skull: Normal. Negative for fracture or focal lesion. Sinuses/Orbits: No acute finding. Other: None. CT CERVICAL SPINE FINDINGS Alignment: Image quality is degraded by motion. Reversal of the normal cervical lordosis, centered at C6. Skull base and vertebrae: Image quality is degraded by motion. No definite fracture. Soft tissues and spinal canal: Image quality is degraded by motion. No  definite acute finding. Disc levels: Image quality is degraded by motion. Multilevel facet hypertrophy. Endplate degenerative changes are seen anteriorly from C4-5 to C7-T1. Upper chest: Image quality is degraded by motion. Biapical pleuroparenchymal scarring. Other: None. IMPRESSION: 1. No acute intracranial abnormality. 2. Cervical spine images are degraded by motion. No definite fracture or subluxation. 3. Brain atrophy and chronic microvascular white matter ischemic changes. 4. Reversal of the normal cervical lordosis with degenerative disc disease. Electronically Signed   By: Lorin Picket M.D.   On: 02/06/2017 07:54   Dg Chest Port 1 View  Result Date: 02/06/2017 CLINICAL DATA:  Shortness of breath with fall EXAM: PORTABLE CHEST 1 VIEW COMPARISON:  January 03, 2017 FINDINGS: There is no edema or consolidation. Lungs are mildly hyperexpanded. Heart is upper normal in size with pulmonary vascularity within normal limits. Patient is status post internal mammary bypass grafting. There is aortic atherosclerosis. No adenopathy. No evident bone lesions. No pneumothorax. IMPRESSION: Aortic atherosclerosis. No edema or consolidation. Stable cardiac silhouette. Aortic Atherosclerosis (ICD10-I70.0). Electronically Signed   By: Lowella Grip III M.D.   On: 02/06/2017 08:00      Scheduled Meds: . allopurinol  300 mg Oral Daily  . amitriptyline  20 mg Oral QHS  . amLODipine  5 mg Oral QHS  . benzonatate  100 mg Oral TID  . celecoxib  200 mg Oral Daily  . citalopram  20 mg Oral BID  . clopidogrel  75 mg Oral Daily  . digoxin  0.125 mg Oral Q M,W,F  . donepezil  10 mg Oral Daily  . doxycycline  100 mg Oral Q12H  . furosemide  40 mg Oral Daily  . guaiFENesin  600 mg Oral BID  . ipratropium-albuterol  3 mL Nebulization Q6H  . levothyroxine  25 mcg Oral QAC breakfast  . losartan  50 mg Oral Daily  . memantine  28 mg Oral Daily  . metoprolol succinate  25 mg Oral Daily  .  neomycin-bacitracin-polymyxin   Topical Daily  . potassium chloride  40 mEq Oral Once  . predniSONE  50 mg Oral Q breakfast  . sodium chloride flush  3 mL Intravenous Q12H  . warfarin  5 mg Oral Once per day on Sun Mon Wed Thu Fri  . warfarin  7.5 mg Oral Once per day on Tue Sat  . Warfarin - Physician Dosing Inpatient   Does not apply q1800   Continuous Infusions: . sodium chloride       LOS: 1 day    Time spent: 40 minutes   Dessa Phi, DO [facility]  [email or web address] Password [email or web address] 02/07/2017, 8:59 AM

## 2017-02-07 NOTE — Consult Note (Signed)
Consultation Note Date: 02/07/2017   Patient Name: Amber [name]  DOB: August 25, 1934  MRN: [id number]  Age / Sex: 82 y.o., female  PCP: Antony Contras, MD Referring Physician: Dessa Phi, DO  Reason for Consultation: Establishing goals of care  HPI/Patient Profile: 82 y.o. female   admitted on 02/06/2017 .   Clinical Assessment and Goals of Care:  Amber [name] is a 82yo female with past medical history significant for A-fib, COPD (although patient and family does not recall this diagnosis being made), chronic diastolic CHF, CAD s/p CABG, gout, mild dementia who presents from home with dyspnea and a fall. Daughter reports that the patient was in the hospital recently with possibly CHF exacerbation. The patient is currently admitted with resp viral infection. The patient required BIPAP briefly at the time of admission.   A palliative consult has been requested for goals of care discussions.   The patient is resting in bed, she is on supplemental O2, she ate most of her breakfast, she does not appear dyspneic. I introduced myself and palliative care as follows: Palliative medicine is specialized medical care for people living with serious illness. It focuses on providing relief from the symptoms and stress of a serious illness. The goal is to improve quality of life for both the patient and the family.  Patient's daughter who is an Therapist, sports and her primary caregiver is at the bedside, she says, "I'm not sure why they called you." I discussed with her about checking in with patient and family, to review patient's overall condition and to see if additional discussions regarding her overall health needed to take place. The patient's daughter states that COPD is not an official diagnosis for the patient, she is quite surprised that it is mentioned as a diagnosis for the patient. The patient is not on supplemental O2 at  home, she is on rescue albuterol inhaler only.   Offered active listening and supportive care, acknowledged patient's daughter's concern, see recommendations below.     HCPOA  daughter   SUMMARY OF RECOMMENDATIONS    Agree with DNR Continue current mode of care Daughter is an Therapist, sports, she is by the bedside, she states that the patient is improving currently, in this hospitalization, she does not believe that the patient would benefit from additional goals of care/palliative discussions.   Code Status/Advance Care Planning:  DNR    Symptom Management:    as above   Palliative Prophylaxis:   Bowel Regimen   Psycho-social/Spiritual:   Desire for further Chaplaincy support:no  Additional Recommendations: Caregiving  Support/Resources  Prognosis:   Unable to determine  Discharge Planning: To Be Determined      Primary Diagnoses: Present on Admission: . Atrial fibrillation ([place]) . Chronic diastolic heart failure ([place]) . Hypothyroidism . Acute respiratory distress   I have reviewed the medical record, interviewed the patient and family, and examined the patient. The following aspects are pertinent.  Past Medical History:  Diagnosis Date  . Abnormality of gait 08/02/2014  . Atrial fibrillation ([place])  a. chronic/rate controlled;  b. chronic coumadin.  [name] Cerebrovascular disease 05/21/2016  . Cervical spinal stenosis   . Chronic diastolic CHF (congestive heart failure) ([place])    a. 02/2013 Echo: EF 55-60%, mild AI, mod dil LA.  [name] COPD with emphysema ([place])    PFT 05/02/10>>FEV1 1.35(62%), FEV1% 66, DLCO 75%  . Coronary artery disease    a. s/p CABG;  b. 04/2010 Neg MV  . CVA (cerebral vascular accident) ([place])    left sided weakness  . Dementia   . Depression   . Fibromyalgia   . GERD (gastroesophageal reflux disease)    pepcid 2-3 times per week  . Gout   . Hemiparesis and alteration of sensations as late effects of stroke ([place]) 01/17/2015  . History of melanoma     squamous cell, melanoma  . Hyperlipidemia   . Hypertension   . Hypothyroidism   . Insomnia   . Intermittent confusion   . Memory change 01/23/2014  . Nasal polyposis   . Osteoarthritis   . Osteoporosis   . Pneumonia    1990   Social History   Socioeconomic History  . Marital status: Married    Spouse name: None  . Number of children: 2  . Years of education: None  . Highest education level: None  Social Needs  . Financial resource strain: None  . Food insecurity - worry: None  . Food insecurity - inability: None  . Transportation needs - medical: None  . Transportation needs - non-medical: None  Occupational History  . 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 Occupation: Network engineer    Comment: Retired  Tobacco Use  . Smoking status: Former Smoker    Packs/day: 1.50    Years: 25.00    Pack years: 37.50    Types: Cigarettes    Last attempt to quit: 02/10/1977    Years since quitting: 40.0  . Smokeless tobacco: Never Used  Substance and Sexual Activity  . Alcohol use: No  . Drug use: No  . Sexual activity: None  Other Topics Concern  . None  Social History Narrative   Patient is right handed.   Patient lives at home with husband   Patient drinks 8 oz caffeine occasionally.   Family History  Problem Relation Age of Onset  . Stroke Mother   . CAD Father   . Diabetes type II Brother   . CAD Brother    Scheduled Meds: . allopurinol  300 mg Oral Daily  . amitriptyline  20 mg Oral QHS  . amLODipine  5 mg Oral QHS  . benzonatate  100 mg Oral TID  . celecoxib  200 mg Oral Daily  . citalopram  20 mg Oral BID  . clopidogrel  75 mg Oral Daily  . digoxin  0.125 mg Oral Q M,W,F  . donepezil  10 mg Oral Daily  . doxycycline  100 mg Oral Q12H  . furosemide  40 mg Oral Daily  . guaiFENesin  600 mg Oral BID  . ipratropium-albuterol  3 mL Nebulization Q6H  . levothyroxine  25 mcg Oral QAC breakfast  . losartan  50 mg Oral Daily  .  memantine  28 mg Oral Daily  . metoprolol succinate  25 mg Oral Daily  . neomycin-bacitracin-polymyxin   Topical Daily  . predniSONE  50 mg Oral Q breakfast  . sodium chloride flush  3 mL Intravenous Q12H  . warfarin  5 mg Oral Once per day  on Sun Mon Wed Thu Fri  . warfarin  7.5 mg Oral Once per day on Tue Sat  . Warfarin - Physician Dosing Inpatient   Does not apply q1800   Continuous Infusions: . sodium chloride     PRN Meds:.sodium chloride, acetaminophen **OR** acetaminophen, albuterol, ondansetron **OR** ondansetron (ZOFRAN) IV, sodium chloride flush Medications Prior to Admission:  Prior to Admission medications   Medication Sig Start Date End Date Taking? Authorizing Provider  albuterol (PROVENTIL HFA;VENTOLIN HFA) 108 (90 Base) MCG/ACT inhaler Inhale 2 puffs into the lungs every 4 (four) hours as needed for wheezing or shortness of breath. 07/31/16  Yes Joy, Shawn C, PA-C  allopurinol (ZYLOPRIM) 300 MG tablet Take 1 tablet (300 mg total) by mouth every other day. Patient taking differently: Take 300 mg by mouth daily.  03/06/16  Yes Burtis Junes, NP  amitriptyline (ELAVIL) 10 MG tablet TAKE 2 TABLETS BY MOUTH AT BEDTIME Patient taking differently: TAKE 20mg   BY MOUTH AT BEDTIME 09/01/16  Yes Kathrynn Ducking, MD  amLODipine (NORVASC) 5 MG tablet Take 1 tablet (5 mg total) by mouth at bedtime. 01/12/17  Yes Belva Crome, MD  celecoxib (CELEBREX) 200 MG capsule Take 200 mg by mouth every morning.    Yes [provider]  citalopram (CELEXA) 20 MG tablet TAKE 1 TABLET BY MOUTH TWICE A DAY 11/09/14  Yes Darlin Coco, MD  clopidogrel (PLAVIX) 75 MG tablet TAKE 1 TABLET (75 MG TOTAL) BY MOUTH DAILY. 02/04/17  Yes Belva Crome, MD  COUMADIN 5 MG tablet TAKE AS DIRECTED BY COUMADIN CLINIC. Hold coumadin November 24 th and November 25 th.  Recheck INR on Monday. Patient taking differently: Take 5-7.5 mg by mouth daily. Take 5 mg everyday except 7.5 mg on Tuesday and  Saturday 01/03/17  Yes Hosie Poisson, MD  digoxin (LANOXIN) 0.125 MG tablet Take 1 tablet (0.125 mg total) by mouth on Mondays, Wednesdays, & Fridays 03/27/16  Yes Belva Crome, MD  donepezil (ARICEPT) 10 MG tablet Take 1 tablet (10 mg total) by mouth at bedtime. Patient taking differently: Take 10 mg by mouth daily.  06/13/16  Yes Kathrynn Ducking, MD  ergocalciferol (VITAMIN D2) 50000 UNITS capsule Take 50,000 Units by mouth 2 (two) times a week. Tuesday and Friday   Yes [provider]  furosemide (LASIX) 40 MG tablet Take 1 tablet (40 mg total) by mouth daily. 01/04/17  Yes Hosie Poisson, MD  KLOR-CON M20 20 MEQ tablet Take 20 mEq by mouth daily.  03/11/16  Yes [provider]  levothyroxine (SYNTHROID, LEVOTHROID) 25 MCG tablet Take 25 mcg by mouth every morning.    Yes [provider]  losartan (COZAAR) 50 MG tablet Take 1 tablet (50 mg total) by mouth daily. 01/27/17 01/22/18 Yes Belva Crome, MD  memantine (NAMENDA XR) 28 MG CP24 24 hr capsule TAKE ONE CAPSULE BY MOUTH EVERY DAY 07/15/16  Yes Kathrynn Ducking, MD  metoprolol succinate (TOPROL-XL) 25 MG 24 hr tablet TAKE 1 TABLET (25 MG TOTAL) BY MOUTH DAILY. Patient taking differently: TAKE 1 TABLET (25 MG TOTAL) BY MOUTH DAILY. Morning 05/16/16  Yes Belva Crome, MD  nitroGLYCERIN (NITROSTAT) 0.4 MG SL tablet Place 1 tablet (0.4 mg total) under the tongue every 5 (five) minutes as needed for chest pain. 09/13/12  Yes Darlin Coco, MD  PROAIR HFA 108 (90 BASE) MCG/ACT inhaler INHALE 2 PUFFS INTO LUNGS EVERY 6 HOURS AS NEEDED FOR WHEEZING Patient not taking: Reported on  02/06/2017 12/22/13   Darlin Coco, MD   Allergies  Allergen Reactions  . Ezetimibe Other (See Comments)    Myalgia  . Welchol [Colesevelam Hcl] Other (See Comments)    Muscle aches  . Adhesive [Tape] Itching, Rash and Other (See Comments)    Burning   . Ceclor [Cefaclor] Other (See Comments)    Unknown allergic reaction  . Elastic  Bandages & [Zinc] Rash and Other (See Comments)    Turns red on the areas it touches  . Latex Itching, Rash and Other (See Comments)    Burning   . Lipitor [Atorvastatin Calcium] Other (See Comments)    Increased fibromyalgia pain  . Mevacor [Lovastatin] Other (See Comments)    Increased fibromyalgia pain  . Pravachol Other (See Comments)    Increased fibromyalgia pain  . Vasotec Other (See Comments)    Unknown allergic reaction   Review of Systems + weakness  Physical Exam Weak frail elderly lady Bruise on forehead/face Coarse wheezes bilaterally S1 S2 Abdomen soft No edema Has dementia  Vital Signs: BP (!) 158/73 (BP Location: Right Arm)   Pulse 83   Temp (!) 97.5 F (36.4 C) (Oral)   Resp 20   Ht 5\' 5"  (1.651 m)   SpO2 99%   BMI 25.79 kg/m  Pain Assessment: No/denies pain   Pain Score: Asleep   SpO2: SpO2: 99 % O2 Device:SpO2: 99 % O2 Flow Rate: .O2 Flow Rate (L/min): 2 L/min  IO: Intake/output summary:   Intake/Output Summary (Last 24 hours) at 02/07/2017 1349 Last data filed at 02/07/2017 1100 Gross per 24 hour  Intake 440 ml  Output -  Net 440 ml   PPS 40% LBM: Last BM Date: 02/06/17 Baseline Weight:   Most recent weight:       Palliative Assessment/Data:     Time In: 12 Time Out: 1300  Time Total:  60 min  Greater than 50%  of this time was spent counseling and coordinating care related to the above assessment and plan.  Signed by: Loistine Chance, MD  [phone]  Please contact Palliative Medicine Team phone at [phone] for questions and concerns.  For individual provider: See Shea Evans

## 2017-02-07 NOTE — Plan of Care (Signed)
Patient remains stable, daughter at bedside.

## 2017-02-07 NOTE — Evaluation (Signed)
Occupational Therapy Evaluation Patient Details Name: Amber [name] MRN: [id number] DOB: 04-26-1934 Today's Date: 02/07/2017    History of Present Illness 82 y.o. female admitted on 02/06/17 for SOB, fall at home. dx with acute respiratory distress, bronchitis with hypoxia (h/o COPD) with suspected viral infection (culture results pending). Pt also with severe sepsis, tachycardia, tachypnea, lactic acidosis.  Pt with significant PMH of memory issues, HTN, CVA with left sided weakness, fibromyalgia, dementia, CAD, COPD, chronic diastolic CHF, cervical spine stenosis, A-fib on coumadin, and CABG.     Clinical Impression   Pt was admitted for the above.  At baseline, she is mod I with adls, but she has had several falls.  She has been sleeping in another room from husband, walking to bathroom without AFO and taking several steps into bathroom without RW as it doesn't fit.  Someone helps her into/out of shower and she showers herself.  Pt with decreased balance at this time, even in sitting when leaning over to don AFO.  Daughter present and feels her father can safely assist pt at home. She will move recliner into his room and use 3:1 (needs). Goals are for min guard in acute setting.     Follow Up Recommendations  Home health OT;Supervision/Assistance - 24 hour(family does not want SNF)    Equipment Recommendations  3 in 1 bedside commode    Recommendations for Other Services       Precautions / Restrictions Precautions Precautions: Fall Precaution Comments: recent h/o falls Required Braces or Orthoses: Other Brace/Splint Other Brace/Splint: left leg bil upright AFO Restrictions Weight Bearing Restrictions: No      Mobility Bed Mobility Overal bed mobility: Needs Assistance Bed Mobility: Supine to Sit;Sit to Supine     Supine to sit: Min guard;HOB elevated(use of rails) Sit to supine: Min assist;HOB elevated(for legs and use of rail.  )   General bed mobility comments: sleeps  in recliner at baseline  Transfers Overall transfer level: Needs assistance Equipment used: Rolling walker (2 wheeled) Transfers: Sit to/from Stand Sit to Stand: Min assist         General transfer comment: and min A to step forward and sidestep.  Pt had walked in hall earlier with RN, daughter and RW    Balance                                           ADL either performed or assessed with clinical judgement   ADL Overall ADL's : Needs assistance/impaired Eating/Feeding: Set up;Bed level   Grooming: Set up;Bed level   Upper Body Bathing: Set up;Min guard;Sitting   Lower Body Bathing: Minimal assistance;Sit to/from stand   Upper Body Dressing : Minimal assistance;Sitting   Lower Body Dressing: Maximal assistance;Sit to/from stand                 General ADL Comments: therapist donned shoes/socks and AFO on LLE.  pt normally independent with this. She reached to floor to retrieve tissue; min A for balance; not steady enough to do this.  Daughter wants pt to return home with husband and her assist. She will move pt's recliner into her father's bedroom. Recommended 3:1 with assist at night as pt doesn't don AFO/shoes     Vision         Perception     Praxis      Pertinent Vitals/Pain Pain  Assessment: No/denies pain     Hand Dominance Right   Extremity/Trunk Assessment Upper Extremity Assessment RUE Deficits / Details: bil UEs with RA changes and decreased flexion but able to hold utensil.  Had hurt R wrist in fall; xray negative; IV near wrist LUE Deficits / Details: as above; bruised under 3rd and 4th MCPs           Communication Communication Communication: No difficulties   Cognition Arousal/Alertness: Awake/alert Behavior During Therapy: WFL for tasks assessed/performed Overall Cognitive Status: History of cognitive impairments - at baseline                                 General Comments: has dementia; asked  daughter answer to a couple of questions   General Comments  on 02, no dyspnea.  doesn't wear this at baseline    Exercises     Shoulder Instructions      Home Living Family/patient expects to be discharged to:: Private residence Living Arrangements: Spouse/significant other Available Help at Discharge: Family               Bathroom Shower/Tub: Walk-in shower   Bathroom Toilet: Handicapped height     [place]: Environmental consultant - 4 wheels;Electric scooter;Other (comment)   Additional Comments: daughter lives next door, RN; husband can assist pt per daughter      Prior Functioning/Environment Level of Independence: Needs assistance  Gait / Transfers Assistance Needed: uses rollator at baseline.     Comments: can complete ADLs with extra time; assist for getting in and out of shower        OT Problem List: Decreased strength;Decreased activity tolerance;Impaired balance (sitting and/or standing);Decreased range of motion;Decreased cognition;Decreased safety awareness;Decreased knowledge of use of DME or AE;Cardiopulmonary status limiting activity;Pain;Impaired UE functional use      OT Treatment/Interventions: Self-care/ADL training;DME and/or AE instruction;Patient/family education;Balance training;Cognitive remediation/compensation;Therapeutic activities    OT Goals(Current goals can be found in the care plan section) Acute Rehab OT Goals Patient Stated Goal: daughter wants her to breathe better and stop falling and return home OT Goal Formulation: With patient/family Time For Goal Achievement: 02/21/17 Potential to Achieve Goals: Good ADL Goals Pt Will Perform Grooming: with min guard assist;standing Pt Will Transfer to Toilet: with min guard assist;bedside commode;stand pivot transfer Pt Will Perform Toileting - Clothing Manipulation and hygiene: sit to/from stand;with min guard assist Additional ADL Goal #2: pt will use long sponge to bathe with min guard, sit to  lean  OT Frequency: Min 2X/week   Barriers to D/C:            Co-evaluation              AM-PAC PT "6 Clicks" Daily Activity     Outcome Measure Help from another person eating meals?: A Little Help from another person taking care of personal grooming?: A Little Help from another person toileting, which includes using toliet, bedpan, or urinal?: A Little Help from another person bathing (including washing, rinsing, drying)?: A Lot Help from another person to put on and taking off regular upper body clothing?: A Little Help from another person to put on and taking off regular lower body clothing?: A Lot 6 Click Score: 16   End of Session    Activity Tolerance: Patient tolerated treatment well Patient left: in bed;with call bell/phone within reach;with bed alarm set;with family/visitor present  OT Visit Diagnosis: Muscle weakness (generalized) (M62.81);Unsteadiness on  feet (R26.81)                Time: 2122-4825 OT Time Calculation (min): 28 min Charges:  OT General Charges $OT Visit: 1 Visit OT Evaluation $OT Eval Moderate Complexity: 1 Mod OT Treatments $Therapeutic Activity: 8-22 mins G-Codes:     [place], OTR/L [phone] 02/07/2017  Amber [name] 02/07/2017, 4:21 PM

## 2017-02-07 NOTE — Progress Notes (Signed)
Patient walked from her room to end of hallway on room air, lowest oxygen saturation noted was 90%.  Patient did become weak and short of breath, walked back to her room with walker and placed back on oxygen for comfort at this time.

## 2017-02-07 NOTE — Progress Notes (Signed)
Patient transferred to [place] from stepdown unit, VSS.  Patient 99% on 2 liters at this time, goal is to wean off oxygen as patient does not use oxygen at baseline. Patient denies pain. Daughter at bedside.

## 2017-02-08 ENCOUNTER — Inpatient Hospital Stay (HOSPITAL_COMMUNITY): Payer: Medicare Other

## 2017-02-08 LAB — BASIC METABOLIC PANEL
Anion gap: 10 (ref 5–15)
BUN: 30 mg/dL — AB (ref 6–20)
CHLORIDE: 101 mmol/L (ref 101–111)
CO2: 23 mmol/L (ref 22–32)
Calcium: 8.8 mg/dL — ABNORMAL LOW (ref 8.9–10.3)
Creatinine, Ser: 1.14 mg/dL — ABNORMAL HIGH (ref 0.44–1.00)
GFR calc non Af Amer: 44 mL/min — ABNORMAL LOW (ref >60)
GFR, EST AFRICAN AMERICAN: 51 mL/min — AB (ref >60)
Glucose, Bld: 169 mg/dL — ABNORMAL HIGH (ref 65–99)
POTASSIUM: 4 mmol/L (ref 3.5–5.1)
SODIUM: 134 mmol/L — AB (ref 135–145)

## 2017-02-08 LAB — CBC
HEMATOCRIT: 37.8 % (ref 36.0–46.0)
HEMOGLOBIN: 12.8 g/dL (ref 12.0–15.0)
MCH: 30.4 pg (ref 26.0–34.0)
MCHC: 33.9 g/dL (ref 30.0–36.0)
MCV: 89.8 fL (ref 78.0–100.0)
Platelets: 199 10*3/uL (ref 150–400)
RBC: 4.21 MIL/uL (ref 3.87–5.11)
RDW: 15.6 % — ABNORMAL HIGH (ref 11.5–15.5)
WBC: 14.7 10*3/uL — ABNORMAL HIGH (ref 4.0–10.5)

## 2017-02-08 LAB — PROTIME-INR
INR: 2.65
PROTHROMBIN TIME: 28.1 s — AB (ref 11.4–15.2)

## 2017-02-08 MED ORDER — DEXTROSE 5 % IV SOLN
1.0000 g | INTRAVENOUS | Status: DC
  Administered 2017-02-08 – 2017-02-10 (×3): 1 g via INTRAVENOUS
  Filled 2017-02-08 (×4): qty 10

## 2017-02-08 MED ORDER — METHYLPREDNISOLONE SODIUM SUCC 40 MG IJ SOLR
40.0000 mg | Freq: Two times a day (BID) | INTRAMUSCULAR | Status: AC
  Administered 2017-02-08 – 2017-02-11 (×7): 40 mg via INTRAVENOUS
  Filled 2017-02-08 (×7): qty 1

## 2017-02-08 MED ORDER — IPRATROPIUM-ALBUTEROL 0.5-2.5 (3) MG/3ML IN SOLN
3.0000 mL | RESPIRATORY_TRACT | Status: DC
  Administered 2017-02-08: 3 mL via RESPIRATORY_TRACT
  Filled 2017-02-08 (×2): qty 3

## 2017-02-08 NOTE — Progress Notes (Signed)
PROGRESS NOTE    Amber [name]  JKD:[id number] DOB: 07-12-1934 DOA: 02/06/2017 PCP: Antony Contras, MD     Brief Narrative:  Amber [name] is a 82 yo female with past medical history significant for A-fib, COPD (although patient and family does not recall this diagnosis being made), chronic diastolic CHF, CAD s/p CABG, gout, mild dementia who presents from home with dyspnea and a fall. Per her daughter, the patient has had a runny nose, sore throat and a cough for about 2 days. Her grandchildren were sick with the same illness a few days ago. She has become steadily short of breath and this morning was breathing with rapid shallow respirations. The patient has not had a fever or chills. No chest pain. Her cough is productive but she cannot tell if it is clear or green sputum. In the ED, patient was in respiratory distress with respiratory fatigue and was started on BiPAP. CXR was negative for consolidation. She was admitted to stepdown unit for respiratory failure, COPD exacerbation/bronchitis.   Assessment & Plan:   Principal Problem:   Acute respiratory distress Active Problems:   Atrial fibrillation (HCC)   Chronic diastolic heart failure (HCC)   Hypothyroidism   Dementia   Severe sepsis (HCC)   Occult blood in stools   Lactic acidosis  Acute hypoxemic respiratory failure -Required BiPAP initially, currently on nasal cannula O2.  Continue to wean to room air as tolerated  Severe sepsis secondary to acute viral bronchitis, COPD exacerbation, CAP -Patient and family do not recall COPD diagnosis, but was recorded in her chart years ago.  Patient was a smoker over 40 years ago.  -Resp PCR panel +rhinovirus -Continue breathing treatments -Blood cultures are pending -CXR repeated this morning, now showing RUL patchy opacity. Will add rocephin to cover CAP. Continue doxycycline (no azithro due to drug interactions)  -Continues to wheeze so will continue IV solumedrol today. Start to  taper soon with improvement   Atrial fibrillation -Rate well controlled.  Continue Coumadin, metoprolol, digoxin   Chronic diastolic heart failure -Continue home Lasix, metoprolol  HTN -Continue losartan, Norvasc  Hypothyroidism -Continue Synthroid  Hemoccult positive -Has not noted any bloody stools, hemoglobin is normal at this time -Would recommend outpatient follow-up   Dementia -Continue Namenda, Aricept  Frequent falls -CT head: No acute intracranial abnormality -Xray right wrist: Soft tissue swelling about the right wrist without acute fracture. -PT OT  CAD -Continue Plavix   Gout -Continue allopurinol   DVT prophylaxis: Coumadin Code Status: DNR Family Communication: Daughter at bedside Disposition Plan: Continue present treatment    Consultants:   None  Procedures:   None   Antimicrobials:  Anti-infectives (From admission, onward)   Start     Dose/Rate Route Frequency Ordered Stop   02/07/17 1000  azithromycin (ZITHROMAX) tablet 500 mg  Status:  Discontinued     500 mg Oral Daily 02/07/17 0738 02/07/17 0838   02/07/17 1000  doxycycline (VIBRA-TABS) tablet 100 mg     100 mg Oral Every 12 hours 02/07/17 0838     02/06/17 1400  cefTRIAXone (ROCEPHIN) 1 g in dextrose 5 % 50 mL IVPB  Status:  Discontinued     1 g 100 mL/hr over 30 Minutes Intravenous Every 24 hours 02/06/17 1324 02/07/17 0738   02/06/17 1000  levofloxacin (LEVAQUIN) IVPB 750 mg  Status:  Discontinued     750 mg 100 mL/hr over 90 Minutes Intravenous Every 24 hours 02/06/17 0959 02/06/17 1320  Subjective: Continued to have wheezing overnight, some confusion as well.  Overall, not feeling as well as she did yesterday on examination.  Objective: Vitals:   02/08/17 0145 02/08/17 0454 02/08/17 0807 02/08/17 1046  BP:  (!) 152/60  135/60  Pulse:  78  85  Resp:  20    Temp:  98.5 F (36.9 C)    TempSrc:  Oral    SpO2: 98% 97% 94% 99%  Weight:      Height:         Intake/Output Summary (Last 24 hours) at 02/08/2017 1310 Last data filed at 02/07/2017 1846 Gross per 24 hour  Intake 240 ml  Output -  Net 240 ml   Filed Weights   02/07/17 2157  Weight: 70.3 kg (155 lb 0 oz)    Examination:  General exam: Appears calm and comfortable  Respiratory system: Diffuse wheezes bilaterally. More clear in the bases. Respiratory effort normal. Amber [name] O2 sitting on her forehead Cardiovascular system: S1 & S2 heard, Irreg rhythm, rate well controlled in the 80s. No JVD, murmurs, rubs, gallops or clicks.  Gastrointestinal system: Abdomen is nondistended, soft and nontender. No organomegaly or masses felt. Normal bowel sounds heard. Central nervous system: Alert and oriented. No focal neurological deficits. Extremities: Symmetric  Skin: No rashes, lesions or ulcers Psychiatry: +Hx dementia, very pleasant   Data Reviewed: I have personally reviewed following labs and imaging studies  CBC: Recent Labs  Lab 02/06/17 0652 02/07/17 0322 02/08/17 0419  WBC 15.4* 12.5* 14.7*  NEUTROABS 13.5*  --   --   HGB 13.8 12.0 12.8  HCT 42.6 37.0 37.8  MCV 90.6 89.6 89.8  PLT 226 183 315   Basic Metabolic Panel: Recent Labs  Lab 02/06/17 0652 02/07/17 0322 02/08/17 0419  NA 136 136 134*  K 3.6 3.4* 4.0  CL 98* 101 101  CO2 25 24 23   GLUCOSE 153* 184* 169*  BUN 14 18 30*  CREATININE 0.84 0.86 1.14*  CALCIUM 9.0 8.4* 8.8*   GFR: Estimated Creatinine Clearance: 38.7 mL/min (A) (by C-G formula based on SCr of 1.14 mg/dL (H)). Liver Function Tests: Recent Labs  Lab 02/06/17 0652  AST 32  ALT 16  ALKPHOS 72  BILITOT 1.5*  PROT 8.1  ALBUMIN 4.8   No results for input(s): LIPASE, AMYLASE in the last 168 hours. No results for input(s): AMMONIA in the last 168 hours. Coagulation Profile: Recent Labs  Lab 02/06/17 0652 02/07/17 0322 02/08/17 0419  INR 2.38 2.72 2.65   Cardiac Enzymes: No results for input(s): CKTOTAL, CKMB, CKMBINDEX, TROPONINI  in the last 168 hours. BNP (last 3 results) Recent Labs    01/23/17 1156  PROBNP 2,015*   HbA1C: No results for input(s): HGBA1C in the last 72 hours. CBG: No results for input(s): GLUCAP in the last 168 hours. Lipid Profile: No results for input(s): CHOL, HDL, LDLCALC, TRIG, CHOLHDL, LDLDIRECT in the last 72 hours. Thyroid Function Tests: No results for input(s): TSH, T4TOTAL, FREET4, T3FREE, THYROIDAB in the last 72 hours. Anemia Panel: No results for input(s): VITAMINB12, FOLATE, FERRITIN, TIBC, IRON, RETICCTPCT in the last 72 hours. Sepsis Labs: Recent Labs  Lab 02/06/17 0706 02/06/17 1159  LATICACIDVEN 2.52* 2.0*    Recent Results (from the past 240 hour(s))  Culture, blood (Routine X 2) w Reflex to ID Panel     Status: None (Preliminary result)   Collection Time: 02/06/17  9:56 AM  Result Value Ref Range Status   Specimen Description BLOOD RIGHT  HAND  Final   Special Requests IN PEDIATRIC BOTTLE Blood Culture adequate volume  Final   Culture   Final    NO GROWTH 1 DAY Performed at [facility], [address], [place], [place] [address]    Report Status PENDING  Incomplete  Respiratory Panel by PCR     Status: Abnormal   Collection Time: 02/06/17 10:00 AM  Result Value Ref Range Status   Adenovirus NOT DETECTED NOT DETECTED Final   Coronavirus 229E NOT DETECTED NOT DETECTED Final   Coronavirus HKU1 NOT DETECTED NOT DETECTED Final   Coronavirus NL63 NOT DETECTED NOT DETECTED Final   Coronavirus OC43 NOT DETECTED NOT DETECTED Final   Metapneumovirus NOT DETECTED NOT DETECTED Final   Rhinovirus / Enterovirus DETECTED (A) NOT DETECTED Final   Influenza A NOT DETECTED NOT DETECTED Final   Influenza B NOT DETECTED NOT DETECTED Final   Parainfluenza Virus 1 NOT DETECTED NOT DETECTED Final   Parainfluenza Virus 2 NOT DETECTED NOT DETECTED Final   Parainfluenza Virus 3 NOT DETECTED NOT DETECTED Final   Parainfluenza Virus 4 NOT DETECTED NOT DETECTED Final    Respiratory Syncytial Virus NOT DETECTED NOT DETECTED Final   Bordetella pertussis NOT DETECTED NOT DETECTED Final   Chlamydophila pneumoniae NOT DETECTED NOT DETECTED Final   Mycoplasma pneumoniae NOT DETECTED NOT DETECTED Final    Comment: Performed at [facility], [place] [address], [place], [place] [address]  MRSA PCR Screening     Status: None   Collection Time: 02/06/17 11:41 AM  Result Value Ref Range Status   MRSA by PCR NEGATIVE NEGATIVE Final    Comment:        The GeneXpert MRSA Assay (FDA approved for NASAL specimens only), is one component of a comprehensive MRSA colonization surveillance program. It is not intended to diagnose MRSA infection nor to guide or monitor treatment for MRSA infections.   Culture, blood (Routine X 2) w Reflex to ID Panel     Status: None (Preliminary result)   Collection Time: 02/06/17 11:58 AM  Result Value Ref Range Status   Specimen Description BLOOD RIGHT ANTECUBITAL  Final   Special Requests   Final    BOTTLES DRAWN AEROBIC AND ANAEROBIC Blood Culture adequate volume   Culture   Final    NO GROWTH < 24 HOURS Performed at [facility], [address], [place], [place] [address]    Report Status PENDING  Incomplete       Radiology Studies: Dg Chest 2 View  Result Date: 02/08/2017 CLINICAL DATA:  Respiratory distress EXAM: CHEST  2 VIEW COMPARISON:  02/06/2017 chest radiograph. FINDINGS: Intact sternotomy wires. CABG clips overlie the mediastinum. Stable cardiomediastinal silhouette with borderline mild cardiomegaly. No pneumothorax. Small left pleural effusion, stable. No right pleural effusion. New patchy opacity in the peripheral mid to upper right lung. IMPRESSION: 1. New patchy opacity in the peripheral mid to upper right lung, which could represent a developing pneumonia or mild asymmetric pulmonary edema . Recommend attention on follow-up chest radiographs. 2. Borderline mild cardiomegaly, stable. 3. Stable small left  pleural effusion. Electronically Signed   By: Ilona Sorrel M.D.   On: 02/08/2017 11:03      Scheduled Meds: . allopurinol  300 mg Oral Daily  . amitriptyline  20 mg Oral QHS  . amLODipine  5 mg Oral QHS  . benzonatate  100 mg Oral TID  . celecoxib  200 mg Oral Daily  . citalopram  20 mg Oral BID  . clopidogrel  75 mg Oral Daily  . digoxin  0.125 mg Oral Q M,W,F  . donepezil  10 mg Oral Daily  . doxycycline  100 mg Oral Q12H  . furosemide  40 mg Oral Daily  . guaiFENesin  600 mg Oral BID  . ipratropium-albuterol  3 mL Nebulization Q6H  . levothyroxine  25 mcg Oral QAC breakfast  . losartan  50 mg Oral Daily  . memantine  28 mg Oral Daily  . methylPREDNISolone (SOLU-MEDROL) injection  40 mg Intravenous Q12H  . metoprolol succinate  25 mg Oral Daily  . neomycin-bacitracin-polymyxin   Topical Daily  . pantoprazole  40 mg Oral BID AC  . sodium chloride flush  3 mL Intravenous Q12H  . warfarin  5 mg Oral Once per day on Sun Mon Wed Thu Fri  . Warfarin - Physician Dosing Inpatient   Does not apply q1800   Continuous Infusions: . sodium chloride       LOS: 2 days    Time spent: 30 minutes   Dessa Phi, DO [facility] [email or web address] Password [email or web address] 02/08/2017, 1:10 PM

## 2017-02-08 NOTE — Plan of Care (Signed)
Patient stable during 7 a to 7 p shift, states she feels better after receiving additional IV steroids and antibiotics.  Able to maintain oxygen saturation on room air in the 90's, encouraged patient to use oxygen as needed for shortness of breath.  Daughter at bedside entire shift.

## 2017-02-08 NOTE — Progress Notes (Signed)
Patient walked down to end of hallway without oxygen, maintained oxygen saturation anywhere from 92 to 100%.  Mildly dyspneic but improved from prior attempt 02/07/17.

## 2017-02-08 NOTE — Progress Notes (Signed)
SATURATION QUALIFICATIONS: (This note is used to comply with regulatory documentation for home oxygen)  Patient Saturations on Room Air at Rest = 97%  Patient Saturations on Room Air while Ambulating = 92%  Patient Saturations on n/a Liters of oxygen while Ambulating = n/a  Please briefly explain why patient needs home oxygen: patient does not qualify for home oxygen

## 2017-02-09 ENCOUNTER — Other Ambulatory Visit: Payer: Medicare Other

## 2017-02-09 ENCOUNTER — Inpatient Hospital Stay (HOSPITAL_COMMUNITY): Payer: Medicare Other

## 2017-02-09 DIAGNOSIS — R6510 Systemic inflammatory response syndrome (SIRS) of non-infectious origin without acute organ dysfunction: Secondary | ICD-10-CM

## 2017-02-09 DIAGNOSIS — J181 Lobar pneumonia, unspecified organism: Secondary | ICD-10-CM

## 2017-02-09 DIAGNOSIS — I361 Nonrheumatic tricuspid (valve) insufficiency: Secondary | ICD-10-CM

## 2017-02-09 LAB — CBC
HEMATOCRIT: 37.7 % (ref 36.0–46.0)
Hemoglobin: 12.2 g/dL (ref 12.0–15.0)
MCH: 29.2 pg (ref 26.0–34.0)
MCHC: 32.4 g/dL (ref 30.0–36.0)
MCV: 90.2 fL (ref 78.0–100.0)
PLATELETS: 246 10*3/uL (ref 150–400)
RBC: 4.18 MIL/uL (ref 3.87–5.11)
RDW: 15.9 % — AB (ref 11.5–15.5)
WBC: 12.5 10*3/uL — AB (ref 4.0–10.5)

## 2017-02-09 LAB — BASIC METABOLIC PANEL
Anion gap: 9 (ref 5–15)
BUN: 33 mg/dL — AB (ref 6–20)
CO2: 26 mmol/L (ref 22–32)
CREATININE: 1.03 mg/dL — AB (ref 0.44–1.00)
Calcium: 9.1 mg/dL (ref 8.9–10.3)
Chloride: 102 mmol/L (ref 101–111)
GFR, EST AFRICAN AMERICAN: 58 mL/min — AB (ref >60)
GFR, EST NON AFRICAN AMERICAN: 50 mL/min — AB (ref >60)
Glucose, Bld: 170 mg/dL — ABNORMAL HIGH (ref 65–99)
POTASSIUM: 3.9 mmol/L (ref 3.5–5.1)
SODIUM: 137 mmol/L (ref 135–145)

## 2017-02-09 LAB — TROPONIN I: Troponin I: 0.1 ng/mL (ref <0.03)

## 2017-02-09 LAB — PROTIME-INR
INR: 3.82
Prothrombin Time: 37.3 seconds — ABNORMAL HIGH (ref 11.4–15.2)

## 2017-02-09 LAB — ECHOCARDIOGRAM LIMITED
HEIGHTINCHES: 65 in
Weight: 2480.0163 oz

## 2017-02-09 MED ORDER — PERFLUTREN LIPID MICROSPHERE
1.0000 mL | INTRAVENOUS | Status: AC | PRN
  Filled 2017-02-09: qty 10

## 2017-02-09 MED ORDER — METOPROLOL TARTRATE 5 MG/5ML IV SOLN
5.0000 mg | Freq: Once | INTRAVENOUS | Status: AC
  Administered 2017-02-09: 5 mg via INTRAVENOUS
  Filled 2017-02-09: qty 5

## 2017-02-09 MED ORDER — PERFLUTREN LIPID MICROSPHERE
INTRAVENOUS | Status: AC
  Filled 2017-02-09: qty 10

## 2017-02-09 MED ORDER — HALOPERIDOL LACTATE 5 MG/ML IJ SOLN
1.0000 mg | Freq: Four times a day (QID) | INTRAMUSCULAR | Status: DC | PRN
  Administered 2017-02-10: 1 mg via INTRAVENOUS
  Filled 2017-02-09: qty 1

## 2017-02-09 MED ORDER — QUETIAPINE FUMARATE 25 MG PO TABS
25.0000 mg | ORAL_TABLET | Freq: Every day | ORAL | Status: DC
  Administered 2017-02-09: 25 mg via ORAL
  Filled 2017-02-09: qty 1

## 2017-02-09 MED ORDER — IPRATROPIUM-ALBUTEROL 0.5-2.5 (3) MG/3ML IN SOLN
3.0000 mL | RESPIRATORY_TRACT | Status: DC
  Administered 2017-02-09 – 2017-02-10 (×11): 3 mL via RESPIRATORY_TRACT
  Filled 2017-02-09 (×10): qty 3

## 2017-02-09 MED ORDER — PERFLUTREN LIPID MICROSPHERE
1.0000 mL | INTRAVENOUS | Status: AC | PRN
  Administered 2017-02-09: 2 mL via INTRAVENOUS
  Filled 2017-02-09 (×2): qty 10

## 2017-02-09 MED ORDER — MOMETASONE FURO-FORMOTEROL FUM 200-5 MCG/ACT IN AERO
2.0000 | INHALATION_SPRAY | Freq: Two times a day (BID) | RESPIRATORY_TRACT | Status: DC
  Administered 2017-02-09 – 2017-02-13 (×8): 2 via RESPIRATORY_TRACT
  Filled 2017-02-09: qty 8.8

## 2017-02-09 NOTE — Progress Notes (Signed)
PT Cancellation Note  Patient Details Name: Amber [name] MRN: [id number] DOB: Mar 17, 1934   Cancelled Treatment:    Reason Eval/Treat Not Completed: Other (comment). Daughter present and reports that patient ambulated  In hallway on RA  with daughter. Daughter expresses concern that patient is wheezing more since"steroid" medication given. Will check back another time.  Patient hallucinating currently.   Claretha Cooper 02/09/2017, 11:58 AM  Tresa Endo PT [phone]

## 2017-02-09 NOTE — Progress Notes (Signed)
[facility] PROGRESS NOTE    Progress Note  Amber [name]  MLY:[id number] DOB: [birth year] DOA: 02/06/2017 PCP: Antony Contras, MD     Brief Narrative:   Amber [name] is an 82 y.o. female past medical history significant of atrial fibrillation, COPD (although patient and family does not recall this diagnosis being made), chronic diastolic CHF, CAD s/p CABG, gout, mild dementia who presents from home with dyspnea and a fall.Per her daughter, the patient has had a runny nose, sore throat and a cough for about 2 days. Her grandchildren were sick with the same illness a few days ago. She has become steadily short of breath and this morning was breathing with rapid shallow respirations. She was admitted to stepdown unit for respiratory failure, COPD exacerbation/bronchitis.     Assessment/Plan:   Acute on chronic respiratory failure with hypoxia (HCC) Initially require BiPAP will continue to wean off oxygen.  Sirs likely due to acute viral bronchitis and superimposed right upper lobe pneumonia leading to COPD: Patient has a 40-year pack smoking. Respiratory panel was positive for rhinovirus Cultures are pending Repeat a chest x-ray showed right upper lung patchy opacity, treated empirically with IV Rocephin and doxy. Continue IV steroids due to her pronounced wheezing, at inhaled steroids. Elevation in lactic acid likely due to hypoxia and less likely due to sepsis.  Chronic atrial fibrillation (HCC) On Coumadin INR therapeutic rate control.  Chronic diastolic heart failure: Continue current regimen. She seems to be Euvolemic.  Essential hypertension: Continue valsartan and Norvasc.  Chronic diastolic heart failure (HCC)  Hypothyroidism: Cont  synthroid.  Acute confusional State: Start Seroquel, cont haldol IV.  Mild Elevation cardic-biomarkers: Check echo and 12 lead EKG.    DVT prophylaxis: lovenox Family Communication:daughter Disposition Plan/Barrier to  D/C: unable to determine Code Status:     Code Status Orders  (From admission, onward)        Start     Ordered   02/06/17 1002  Do not attempt resuscitation (DNR)  Continuous    Question Answer Comment  In the event of cardiac or respiratory ARREST Do not call a "code blue"   In the event of cardiac or respiratory ARREST Do not perform Intubation, CPR, defibrillation or ACLS   In the event of cardiac or respiratory ARREST Use medication by any route, position, wound care, and other measures to relive pain and suffering. May use oxygen, suction and manual treatment of airway obstruction as needed for comfort.      02/06/17 1003    Code Status History    Date Active Date Inactive Code Status Order ID Comments User Context   01/02/2017 00:42 01/03/2017 19:01 Full Code [id number]  Oswald Hillock, MD Inpatient   01/02/2017 00:24 01/02/2017 00:42 DNR [id number]  Oswald Hillock, MD Inpatient   07/27/2014 16:10 07/30/2014 12:41 Full Code [id number]  Luanne Bras, MD Inpatient   06/30/2014 14:21 07/01/2014 03:16 Full Code [id number]  Luanne Bras, MD HOV   02/13/2013 01:11 02/19/2013 14:18 Full Code [id number]  Toy Baker, MD Inpatient        IV Access:    Peripheral IV   Procedures and diagnostic studies:   Dg Chest 2 View  Result Date: 02/08/2017 CLINICAL DATA:  Respiratory distress EXAM: CHEST  2 VIEW COMPARISON:  02/06/2017 chest radiograph. FINDINGS: Intact sternotomy wires. CABG clips overlie the mediastinum. Stable cardiomediastinal silhouette with borderline mild cardiomegaly. No pneumothorax. Small left pleural effusion, stable. No right pleural effusion. New patchy opacity in the  peripheral mid to upper right lung. IMPRESSION: 1. New patchy opacity in the peripheral mid to upper right lung, which could represent a developing pneumonia or mild asymmetric pulmonary edema . Recommend attention on follow-up chest radiographs. 2. Borderline mild cardiomegaly, stable. 3.  Stable small left pleural effusion. Electronically Signed   By: Ilona Sorrel M.D.   On: 02/08/2017 11:03     Medical Consultants:    None.  Anti-Infectives:   Rocephin and doxy IV  Subjective:    Amber [name] she seems shaky, slightly confused, her daughter relates that she has been slightly confused and hallucinating seeing things.  Objective:    Vitals:   02/08/17 2055 02/08/17 2309 02/09/17 0307 02/09/17 0526  BP: (!) 157/79   (!) 168/78  Pulse: 94   86  Resp: 18   18  Temp: 97.9 F (36.6 C)   98.2 F (36.8 C)  TempSrc: Axillary   Oral  SpO2: 99% 97% 99% 99%  Weight:      Height:        Intake/Output Summary (Last 24 hours) at 02/09/2017 0823 Last data filed at 02/08/2017 1415 Gross per 24 hour  Intake 50 ml  Output 350 ml  Net -300 ml   Filed Weights   02/07/17 2157  Weight: 70.3 kg (155 lb 0 oz)    Exam: General exam: In no acute distress. Respiratory system: Good air movement and clear to auscultation. Cardiovascular system: S1 & S2 heard, RRR. -JVD Gastrointestinal system: Abdomen is nondistended, soft and nontender.  Central nervous system: Alert and oriented. No focal neurological deficits. Extremities: No pedal edema. Skin: No rashes, lesions or ulcers    Data Reviewed:    Labs: Basic Metabolic Panel: Recent Labs  Lab 02/06/17 0652 02/07/17 0322 02/08/17 0419 02/09/17 0517  NA 136 136 134* 137  K 3.6 3.4* 4.0 3.9  CL 98* 101 101 102  CO2 25 24 23 26   GLUCOSE 153* 184* 169* 170*  BUN 14 18 30* 33*  CREATININE 0.84 0.86 1.14* 1.03*  CALCIUM 9.0 8.4* 8.8* 9.1   GFR Estimated Creatinine Clearance: 42.8 mL/min (A) (by C-G formula based on SCr of 1.03 mg/dL (H)). Liver Function Tests: Recent Labs  Lab 02/06/17 0652  AST 32  ALT 16  ALKPHOS 72  BILITOT 1.5*  PROT 8.1  ALBUMIN 4.8   No results for input(s): LIPASE, AMYLASE in the last 168 hours. No results for input(s): AMMONIA in the last 168 hours. Coagulation  profile Recent Labs  Lab 02/06/17 0652 02/07/17 0322 02/08/17 0419 02/09/17 0517  INR 2.38 2.72 2.65 3.82    CBC: Recent Labs  Lab 02/06/17 0652 02/07/17 0322 02/08/17 0419 02/09/17 0517  WBC 15.4* 12.5* 14.7* 12.5*  NEUTROABS 13.5*  --   --   --   HGB 13.8 12.0 12.8 12.2  HCT 42.6 37.0 37.8 37.7  MCV 90.6 89.6 89.8 90.2  PLT 226 183 199 246   Cardiac Enzymes: Recent Labs  Lab 02/09/17 0111  TROPONINI 0.10*   BNP (last 3 results) Recent Labs    01/23/17 1156  PROBNP 2,015*   CBG: No results for input(s): GLUCAP in the last 168 hours. D-Dimer: No results for input(s): DDIMER in the last 72 hours. Hgb A1c: No results for input(s): HGBA1C in the last 72 hours. Lipid Profile: No results for input(s): CHOL, HDL, LDLCALC, TRIG, CHOLHDL, LDLDIRECT in the last 72 hours. Thyroid function studies: No results for input(s): TSH, T4TOTAL, T3FREE, THYROIDAB in the last  72 hours.  Invalid input(s): FREET3 Anemia work up: No results for input(s): VITAMINB12, FOLATE, FERRITIN, TIBC, IRON, RETICCTPCT in the last 72 hours. Sepsis Labs: Recent Labs  Lab 02/06/17 2229 02/06/17 0706 02/06/17 1159 02/07/17 0322 02/08/17 0419 02/09/17 0517  WBC 15.4*  --   --  12.5* 14.7* 12.5*  LATICACIDVEN  --  2.52* 2.0*  --   --   --    Microbiology Recent Results (from the past 240 hour(s))  Culture, blood (Routine X 2) w Reflex to ID Panel     Status: None (Preliminary result)   Collection Time: 02/06/17  9:56 AM  Result Value Ref Range Status   Specimen Description BLOOD RIGHT HAND  Final   Special Requests IN PEDIATRIC BOTTLE Blood Culture adequate volume  Final   Culture   Final    NO GROWTH 2 DAYS Performed at [facility], [place] [address], [place], [place] [address]    Report Status PENDING  Incomplete  Respiratory Panel by PCR     Status: Abnormal   Collection Time: 02/06/17 10:00 AM  Result Value Ref Range Status   Adenovirus NOT DETECTED NOT DETECTED Final    Coronavirus 229E NOT DETECTED NOT DETECTED Final   Coronavirus HKU1 NOT DETECTED NOT DETECTED Final   Coronavirus NL63 NOT DETECTED NOT DETECTED Final   Coronavirus OC43 NOT DETECTED NOT DETECTED Final   Metapneumovirus NOT DETECTED NOT DETECTED Final   Rhinovirus / Enterovirus DETECTED (A) NOT DETECTED Final   Influenza A NOT DETECTED NOT DETECTED Final   Influenza B NOT DETECTED NOT DETECTED Final   Parainfluenza Virus 1 NOT DETECTED NOT DETECTED Final   Parainfluenza Virus 2 NOT DETECTED NOT DETECTED Final   Parainfluenza Virus 3 NOT DETECTED NOT DETECTED Final   Parainfluenza Virus 4 NOT DETECTED NOT DETECTED Final   Respiratory Syncytial Virus NOT DETECTED NOT DETECTED Final   Bordetella pertussis NOT DETECTED NOT DETECTED Final   Chlamydophila pneumoniae NOT DETECTED NOT DETECTED Final   Mycoplasma pneumoniae NOT DETECTED NOT DETECTED Final    Comment: Performed at [facility], [place] [address] [place], [place] [address]  MRSA PCR Screening     Status: None   Collection Time: 02/06/17 11:41 AM  Result Value Ref Range Status   MRSA by PCR NEGATIVE NEGATIVE Final    Comment:        The GeneXpert MRSA Assay (FDA approved for NASAL specimens only), is one component of a comprehensive MRSA colonization surveillance program. It is not intended to diagnose MRSA infection nor to guide or monitor treatment for MRSA infections.   Culture, blood (Routine X 2) w Reflex to ID Panel     Status: None (Preliminary result)   Collection Time: 02/06/17 11:58 AM  Result Value Ref Range Status   Specimen Description BLOOD RIGHT ANTECUBITAL  Final   Special Requests   Final    BOTTLES DRAWN AEROBIC AND ANAEROBIC Blood Culture adequate volume   Culture   Final    NO GROWTH 2 DAYS Performed at [facility], [address], [place], [place] [address]    Report Status PENDING  Incomplete     Medications:   . allopurinol  300 mg Oral Daily  . amitriptyline  20 mg Oral QHS  .  amLODipine  5 mg Oral QHS  . benzonatate  100 mg Oral TID  . celecoxib  200 mg Oral Daily  . citalopram  20 mg Oral BID  . clopidogrel  75 mg Oral Daily  . digoxin  0.125 mg Oral Q M,W,F  . donepezil  10 mg Oral Daily  . doxycycline  100 mg Oral Q12H  . furosemide  40 mg Oral Daily  . guaiFENesin  600 mg Oral BID  . ipratropium-albuterol  3 mL Nebulization Q4H  . levothyroxine  25 mcg Oral QAC breakfast  . losartan  50 mg Oral Daily  . memantine  28 mg Oral Daily  . methylPREDNISolone (SOLU-MEDROL) injection  40 mg Intravenous Q12H  . metoprolol succinate  25 mg Oral Daily  . neomycin-bacitracin-polymyxin   Topical Daily  . pantoprazole  40 mg Oral BID AC  . sodium chloride flush  3 mL Intravenous Q12H  . warfarin  5 mg Oral Once per day on Sun Mon Wed Thu Fri  . Warfarin - Physician Dosing Inpatient   Does not apply q1800   Continuous Infusions: . sodium chloride    . cefTRIAXone (ROCEPHIN)  IV Stopped (02/08/17 1415)     LOS: 3 days   [facility] Pager [phone]  *Please refer to [email or web address], password [email or web address] to get updated schedule on who will round on this patient, as hospitalists switch teams weekly. If 7PM-7AM, please contact night-coverage at [email or web address], password [email or web address] for any overnight needs.  02/09/2017, 8:23 AM

## 2017-02-09 NOTE — Progress Notes (Signed)
Paged by bedside RN regarding pt in A-Fib with RVR in the 130's to 150's. Also of note pt was experiencing bilateral arm and shoulder pain. All other VSS. An EKG was obtained, Troponin drawn, and 5mg  of Metoprolol IV given. Her EKG revealed A-Fib and her pain resolved once her HR had returned to normal limits. Pts Troponin came back positive at 0.10. Dr. [place] with Cardiology was consulted who recommended continuing with the current course of treatment. We will continue to monitor.  Arby Barrette NP-C, AGPCNP-BC [facility] Pager [phone]

## 2017-02-09 NOTE — Progress Notes (Addendum)
Patient HR in 130s-140s and complaining of arm pain. Paged doctor. Ordered EKG, troponin, and lopressor x 1. Lopressor given and patient stated after her pain already much better and HR now in 90s.

## 2017-02-09 NOTE — Progress Notes (Signed)
ANTICOAGULATION CONSULT NOTE - Follow Up Consult  Pharmacy Consult for warfarin Indication: atrial fibrillation  Allergies  Allergen Reactions  . Ezetimibe Other (See Comments)    Myalgia  . Welchol [Colesevelam Hcl] Other (See Comments)    Muscle aches  . Adhesive [Tape] Itching, Rash and Other (See Comments)    Burning   . Ceclor [Cefaclor] Other (See Comments)    Unknown allergic reaction  . Elastic Bandages & [Zinc] Rash and Other (See Comments)    Turns red on the areas it touches  . Latex Itching, Rash and Other (See Comments)    Burning   . Lipitor [Atorvastatin Calcium] Other (See Comments)    Increased fibromyalgia pain  . Mevacor [Lovastatin] Other (See Comments)    Increased fibromyalgia pain  . Pravachol Other (See Comments)    Increased fibromyalgia pain  . Vasotec Other (See Comments)    Unknown allergic reaction    Patient Measurements: Height: 5\' 5"  (165.1 cm) Weight: 155 lb 0 oz (70.3 kg) IBW/kg (Calculated) : 57   Vital Signs: Temp: 98.2 F (36.8 C) (12/31 0526) Temp Source: Oral (12/31 0526) BP: 168/78 (12/31 0526) Pulse Rate: 86 (12/31 0526)  Labs: Recent Labs    02/07/17 0322 02/08/17 0419 02/09/17 0111 02/09/17 0517  HGB 12.0 12.8  --  12.2  HCT 37.0 37.8  --  37.7  PLT 183 199  --  246  LABPROT 28.6* 28.1*  --  37.3*  INR 2.72 2.65  --  3.82  CREATININE 0.86 1.14*  --  1.03*  TROPONINI  --   --  0.10*  --     Estimated Creatinine Clearance: 42.8 mL/min (A) (by C-G formula based on SCr of 1.03 mg/dL (H)).   Assessment: 82 y.o. female past medical history significant of atrial fibrillation,COPD, chronic diastolic CHF, CAD s/p CABG, gout, mild dementia who presents from home with dyspnea and a fall.  Home dose of warfarin 5mg  daily, except 7.5mg  on Tues and Saturday> re-ordered on admission per MD.  Now pharmacy consulted to manage.  02/09/2017 INR 3.82, supratherapeutic H/H WNL Plts WNL DI: doxycycline   Goal of Therapy:   INR 2-3   Plan:  Hold warfarin today Daily INR  Dolly Rias RPh 02/09/2017, 9:25 AM Pager [phone]

## 2017-02-09 NOTE — Progress Notes (Signed)
Patient trop 0.10. Paged doctor to let him know. No further orders at this time.

## 2017-02-09 NOTE — Progress Notes (Signed)
OT Cancellation Note  Patient Details Name: Amber [name] MRN: [id number] DOB: 03-29-1934   Cancelled Treatment:    Reason Eval/Treat Not Completed: Patient at procedure or test/ unavailable.  Will reattempt.  [place], OTR/L [phone]   Lucille Passy M 02/09/2017, 3:55 PM

## 2017-02-09 NOTE — Progress Notes (Signed)
  Echocardiogram 2D Echocardiogram has been performed.  Merrie Roof F 02/09/2017, 4:31 PM

## 2017-02-10 ENCOUNTER — Other Ambulatory Visit: Payer: Self-pay

## 2017-02-10 LAB — CBC
HCT: 39.7 % (ref 36.0–46.0)
Hemoglobin: 12.9 g/dL (ref 12.0–15.0)
MCH: 29.2 pg (ref 26.0–34.0)
MCHC: 32.5 g/dL (ref 30.0–36.0)
MCV: 89.8 fL (ref 78.0–100.0)
PLATELETS: 228 10*3/uL (ref 150–400)
RBC: 4.42 MIL/uL (ref 3.87–5.11)
RDW: 15.6 % — AB (ref 11.5–15.5)
WBC: 8.3 10*3/uL (ref 4.0–10.5)

## 2017-02-10 LAB — BASIC METABOLIC PANEL
Anion gap: 8 (ref 5–15)
BUN: 31 mg/dL — ABNORMAL HIGH (ref 6–20)
CALCIUM: 8.9 mg/dL (ref 8.9–10.3)
CO2: 26 mmol/L (ref 22–32)
CREATININE: 0.91 mg/dL (ref 0.44–1.00)
Chloride: 105 mmol/L (ref 101–111)
GFR calc non Af Amer: 58 mL/min — ABNORMAL LOW (ref >60)
Glucose, Bld: 188 mg/dL — ABNORMAL HIGH (ref 65–99)
Potassium: 4 mmol/L (ref 3.5–5.1)
SODIUM: 139 mmol/L (ref 135–145)

## 2017-02-10 LAB — PROTIME-INR
INR: 3.91
PROTHROMBIN TIME: 38 s — AB (ref 11.4–15.2)

## 2017-02-10 MED ORDER — QUETIAPINE FUMARATE 50 MG PO TABS
50.0000 mg | ORAL_TABLET | Freq: Every day | ORAL | Status: DC
  Administered 2017-02-10 – 2017-02-15 (×6): 50 mg via ORAL
  Filled 2017-02-10 (×6): qty 1

## 2017-02-10 MED ORDER — QUETIAPINE FUMARATE 50 MG PO TABS: 50.0000 mg | ORAL_TABLET | Freq: Every day | ORAL | Status: DC

## 2017-02-10 MED ORDER — IPRATROPIUM-ALBUTEROL 0.5-2.5 (3) MG/3ML IN SOLN
3.0000 mL | Freq: Four times a day (QID) | RESPIRATORY_TRACT | Status: DC
  Administered 2017-02-11 – 2017-02-12 (×8): 3 mL via RESPIRATORY_TRACT
  Filled 2017-02-10 (×8): qty 3

## 2017-02-10 NOTE — Progress Notes (Signed)
Patient walked to end of hallway and back to room with walker, maintained oxygen saturation in the mid to high 90's at all times on room air.

## 2017-02-10 NOTE — Plan of Care (Signed)
Patient stable 7 a to 7 p shift, VSS, maintains oxygen saturation in the 90's on room air.  Patient very sleepy throughout shift however did get up and sit in the chair and walk in the hallway without incidence.  Foley catheter remains in place with adequate urinary output.  Daughters at bedside.

## 2017-02-10 NOTE — Progress Notes (Addendum)
[facility] PROGRESS NOTE    Progress Note  Amber [name]  QMG:[id number] DOB: 1934-03-24 DOA: 02/06/2017 PCP: Antony Contras, MD     Brief Narrative:   Amber [name] is an 83 y.o. female past medical history significant of atrial fibrillation, COPD (although patient and family does not recall this diagnosis being made), chronic diastolic CHF, CAD s/p CABG, gout, mild dementia who presents from home with dyspnea and a fall.Per her daughter, the patient has had a runny nose, sore throat and a cough for about 2 days. Her grandchildren were sick with the same illness a few days ago. She has become steadily short of breath and this morning was breathing with rapid shallow respirations. She was admitted to stepdown unit for respiratory failure, COPD exacerbation/bronchitis.   Assessment/Plan:   Acute on chronic respiratory failure with hypoxia (HCC) Initially require BiPAP will continue to wean off oxygen.  Sirs likely due to acute viral bronchitis and superimposed right upper lobe pneumonia leading to COPD: Patient has a 40-year pack smoking. Respiratory panel was positive for rhinovirus Cultures is negative Continue IV Rocephin and doxycycline. Continue IV steroids due to her pronounced wheezing, to need inhaled steroids. Elevation in lactic acid likely due to hypoxia and less likely due to sepsis. Get a PT consult.  Chronic atrial fibrillation (HCC) On Coumadin INR therapeutic rate control.  Chronic diastolic heart failure: Continue current regimen. She seems to be Euvolemic.  Essential hypertension: Continue valsartan and Norvasc.  Chronic diastolic heart failure (HCC)  Hypothyroidism: Cont  synthroid.  Acute confusional State: Increased dose of Seroquel check a 12-lead EKG in the morning.   Mild Elevation cardic-biomarkers: Check echo and 12 lead EKG.  Acute urinary retention: Foley placed likely due to medications. We will give her a voiding trial tomorrow  morning.   DVT prophylaxis: lovenox Family Communication:daughter Disposition Plan/Barrier to D/C: Once off IV steroids.  Code Status:     Code Status Orders  (From admission, onward)        Start     Ordered   02/06/17 1002  Do not attempt resuscitation (DNR)  Continuous    Question Answer Comment  In the event of cardiac or respiratory ARREST Do not call a "code blue"   In the event of cardiac or respiratory ARREST Do not perform Intubation, CPR, defibrillation or ACLS   In the event of cardiac or respiratory ARREST Use medication by any route, position, wound care, and other measures to relive pain and suffering. May use oxygen, suction and manual treatment of airway obstruction as needed for comfort.      02/06/17 1003    Code Status History    Date Active Date Inactive Code Status Order ID Comments User Context   01/02/2017 00:42 01/03/2017 19:01 Full Code [id number]  Oswald Hillock, MD Inpatient   01/02/2017 00:24 01/02/2017 00:42 DNR [id number]  Oswald Hillock, MD Inpatient   07/27/2014 16:10 07/30/2014 12:41 Full Code [id number]  Luanne Bras, MD Inpatient   06/30/2014 14:21 07/01/2014 03:16 Full Code [id number]  Luanne Bras, MD HOV   02/13/2013 01:11 02/19/2013 14:18 Full Code [id number]  Toy Baker, MD Inpatient        IV Access:    Peripheral IV   Procedures and diagnostic studies:   Dg Chest 2 View  Result Date: 02/08/2017 CLINICAL DATA:  Respiratory distress EXAM: CHEST  2 VIEW COMPARISON:  02/06/2017 chest radiograph. FINDINGS: Intact sternotomy wires. CABG clips overlie the mediastinum. Stable cardiomediastinal silhouette with borderline  mild cardiomegaly. No pneumothorax. Small left pleural effusion, stable. No right pleural effusion. New patchy opacity in the peripheral mid to upper right lung. IMPRESSION: 1. New patchy opacity in the peripheral mid to upper right lung, which could represent a developing pneumonia or mild asymmetric pulmonary  edema . Recommend attention on follow-up chest radiographs. 2. Borderline mild cardiomegaly, stable. 3. Stable small left pleural effusion. Electronically Signed   By: Ilona Sorrel M.D.   On: 02/08/2017 11:03     Medical Consultants:    None.  Anti-Infectives:   Rocephin and doxy IV  Subjective:    Amber [name] awake today less confused able to recognize daughter.  Objective:    Vitals:   02/10/17 0327 02/10/17 0551 02/10/17 0736 02/10/17 0738  BP:  126/80    Pulse:  82    Resp:  18    Temp:  97.6 F (36.4 C)    TempSrc:  Axillary    SpO2: 92% 99% 98% 98%  Weight:      Height:        Intake/Output Summary (Last 24 hours) at 02/10/2017 0813 Last data filed at 02/09/2017 1715 Gross per 24 hour  Intake 50 ml  Output 1150 ml  Net -1100 ml   Filed Weights   02/07/17 2157  Weight: 70.3 kg (155 lb 0 oz)    Exam: General exam: In no acute distress. Respiratory system: Good air movement and clear to auscultation. Cardiovascular system: S1 & S2 heard, RRR. -JVD Gastrointestinal system: Abdomen is nondistended, soft and nontender.  Central nervous system: Alert and oriented. No focal neurological deficits. Extremities: No pedal edema. Skin: No rashes, lesions or ulcers    Data Reviewed:    Labs: Basic Metabolic Panel: Recent Labs  Lab 02/06/17 0652 02/07/17 0322 02/08/17 0419 02/09/17 0517 02/10/17 0522  NA 136 136 134* 137 139  K 3.6 3.4* 4.0 3.9 4.0  CL 98* 101 101 102 105  CO2 25 24 23 26 26   GLUCOSE 153* 184* 169* 170* 188*  BUN 14 18 30* 33* 31*  CREATININE 0.84 0.86 1.14* 1.03* 0.91  CALCIUM 9.0 8.4* 8.8* 9.1 8.9   GFR Estimated Creatinine Clearance: 48.5 mL/min (by C-G formula based on SCr of 0.91 mg/dL). Liver Function Tests: Recent Labs  Lab 02/06/17 0652  AST 32  ALT 16  ALKPHOS 72  BILITOT 1.5*  PROT 8.1  ALBUMIN 4.8   No results for input(s): LIPASE, AMYLASE in the last 168 hours. No results for input(s): AMMONIA in the last  168 hours. Coagulation profile Recent Labs  Lab 02/06/17 0652 02/07/17 0322 02/08/17 0419 02/09/17 0517 02/10/17 0522  INR 2.38 2.72 2.65 3.82 3.91    CBC: Recent Labs  Lab 02/06/17 0652 02/07/17 0322 02/08/17 0419 02/09/17 0517 02/10/17 0522  WBC 15.4* 12.5* 14.7* 12.5* 8.3  NEUTROABS 13.5*  --   --   --   --   HGB 13.8 12.0 12.8 12.2 12.9  HCT 42.6 37.0 37.8 37.7 39.7  MCV 90.6 89.6 89.8 90.2 89.8  PLT 226 183 199 246 228   Cardiac Enzymes: Recent Labs  Lab 02/09/17 0111  TROPONINI 0.10*   BNP (last 3 results) Recent Labs    01/23/17 1156  PROBNP 2,015*   CBG: No results for input(s): GLUCAP in the last 168 hours. D-Dimer: No results for input(s): DDIMER in the last 72 hours. Hgb A1c: No results for input(s): HGBA1C in the last 72 hours. Lipid Profile: No results for input(s): CHOL,  HDL, LDLCALC, TRIG, CHOLHDL, LDLDIRECT in the last 72 hours. Thyroid function studies: No results for input(s): TSH, T4TOTAL, T3FREE, THYROIDAB in the last 72 hours.  Invalid input(s): FREET3 Anemia work up: No results for input(s): VITAMINB12, FOLATE, FERRITIN, TIBC, IRON, RETICCTPCT in the last 72 hours. Sepsis Labs: Recent Labs  Lab 02/06/17 0706 02/06/17 1159 02/07/17 0322 02/08/17 0419 02/09/17 0517 02/10/17 0522  WBC  --   --  12.5* 14.7* 12.5* 8.3  LATICACIDVEN 2.52* 2.0*  --   --   --   --    Microbiology Recent Results (from the past 240 hour(s))  Culture, blood (Routine X 2) w Reflex to ID Panel     Status: None (Preliminary result)   Collection Time: 02/06/17  9:56 AM  Result Value Ref Range Status   Specimen Description BLOOD RIGHT HAND  Final   Special Requests IN PEDIATRIC BOTTLE Blood Culture adequate volume  Final   Culture   Final    NO GROWTH 3 DAYS Performed at [facility], [place] [address] [place], [place] [address]    Report Status PENDING  Incomplete  Respiratory Panel by PCR     Status: Abnormal   Collection Time: 02/06/17 10:00  AM  Result Value Ref Range Status   Adenovirus NOT DETECTED NOT DETECTED Final   Coronavirus 229E NOT DETECTED NOT DETECTED Final   Coronavirus HKU1 NOT DETECTED NOT DETECTED Final   Coronavirus NL63 NOT DETECTED NOT DETECTED Final   Coronavirus OC43 NOT DETECTED NOT DETECTED Final   Metapneumovirus NOT DETECTED NOT DETECTED Final   Rhinovirus / Enterovirus DETECTED (A) NOT DETECTED Final   Influenza A NOT DETECTED NOT DETECTED Final   Influenza B NOT DETECTED NOT DETECTED Final   Parainfluenza Virus 1 NOT DETECTED NOT DETECTED Final   Parainfluenza Virus 2 NOT DETECTED NOT DETECTED Final   Parainfluenza Virus 3 NOT DETECTED NOT DETECTED Final   Parainfluenza Virus 4 NOT DETECTED NOT DETECTED Final   Respiratory Syncytial Virus NOT DETECTED NOT DETECTED Final   Bordetella pertussis NOT DETECTED NOT DETECTED Final   Chlamydophila pneumoniae NOT DETECTED NOT DETECTED Final   Mycoplasma pneumoniae NOT DETECTED NOT DETECTED Final    Comment: Performed at [facility], [place] [address], [place], [place] [address]  MRSA PCR Screening     Status: None   Collection Time: 02/06/17 11:41 AM  Result Value Ref Range Status   MRSA by PCR NEGATIVE NEGATIVE Final    Comment:        The GeneXpert MRSA Assay (FDA approved for NASAL specimens only), is one component of a comprehensive MRSA colonization surveillance program. It is not intended to diagnose MRSA infection nor to guide or monitor treatment for MRSA infections.   Culture, blood (Routine X 2) w Reflex to ID Panel     Status: None (Preliminary result)   Collection Time: 02/06/17 11:58 AM  Result Value Ref Range Status   Specimen Description BLOOD RIGHT ANTECUBITAL  Final   Special Requests   Final    BOTTLES DRAWN AEROBIC AND ANAEROBIC Blood Culture adequate volume   Culture   Final    NO GROWTH 3 DAYS Performed at [facility], [address] [place], [place] [address]    Report Status PENDING  Incomplete      Medications:   . allopurinol  300 mg Oral Daily  . amitriptyline  20 mg Oral QHS  . amLODipine  5 mg Oral QHS  . benzonatate  100 mg Oral TID  . celecoxib  200 mg Oral Daily  . citalopram  20 mg Oral BID  . clopidogrel  75 mg Oral Daily  . digoxin  0.125 mg Oral Q M,W,F  . donepezil  10 mg Oral Daily  . doxycycline  100 mg Oral Q12H  . furosemide  40 mg Oral Daily  . guaiFENesin  600 mg Oral BID  . ipratropium-albuterol  3 mL Nebulization Q4H  . levothyroxine  25 mcg Oral QAC breakfast  . losartan  50 mg Oral Daily  . memantine  28 mg Oral Daily  . methylPREDNISolone (SOLU-MEDROL) injection  40 mg Intravenous Q12H  . metoprolol succinate  25 mg Oral Daily  . mometasone-formoterol  2 puff Inhalation BID  . neomycin-bacitracin-polymyxin   Topical Daily  . pantoprazole  40 mg Oral BID AC  . QUEtiapine  25 mg Oral QHS  . Warfarin - Physician Dosing Inpatient   Does not apply q1800   Continuous Infusions: . cefTRIAXone (ROCEPHIN)  IV Stopped (02/09/17 1637)     LOS: 4 days   Charlynne Cousins  [facility] Pager [phone]  *Please refer to [email or web address], password [email or web address] to get updated schedule on who will round on this patient, as hospitalists switch teams weekly. If 7PM-7AM, please contact night-coverage at [email or web address], password [email or web address] for any overnight needs.  02/10/2017, 8:13 AM

## 2017-02-10 NOTE — Progress Notes (Signed)
ANTICOAGULATION CONSULT NOTE - Follow Up Consult  Pharmacy Consult for warfarin Indication: atrial fibrillation  Allergies  Allergen Reactions  . Ezetimibe Other (See Comments)    Myalgia  . Welchol [Colesevelam Hcl] Other (See Comments)    Muscle aches  . Adhesive [Tape] Itching, Rash and Other (See Comments)    Burning   . Ceclor [Cefaclor] Other (See Comments)    Unknown allergic reaction  . Elastic Bandages & [Zinc] Rash and Other (See Comments)    Turns red on the areas it touches  . Latex Itching, Rash and Other (See Comments)    Burning   . Lipitor [Atorvastatin Calcium] Other (See Comments)    Increased fibromyalgia pain  . Mevacor [Lovastatin] Other (See Comments)    Increased fibromyalgia pain  . Pravachol Other (See Comments)    Increased fibromyalgia pain  . Vasotec Other (See Comments)    Unknown allergic reaction    Patient Measurements: Height: 5\' 5"  (165.1 cm) Weight: 155 lb 0 oz (70.3 kg) IBW/kg (Calculated) : 57   Vital Signs: Temp: 97.6 F (36.4 C) (01/01 0551) Temp Source: Axillary (01/01 0551) BP: 126/80 (01/01 0551) Pulse Rate: 82 (01/01 0551)  Labs: Recent Labs    02/08/17 0419 02/09/17 0111 02/09/17 0517 02/10/17 0522  HGB 12.8  --  12.2 12.9  HCT 37.8  --  37.7 39.7  PLT 199  --  246 228  LABPROT 28.1*  --  37.3* 38.0*  INR 2.65  --  3.82 3.91  CREATININE 1.14*  --  1.03* 0.91  TROPONINI  --  0.10*  --   --     Estimated Creatinine Clearance: 48.5 mL/min (by C-G formula based on SCr of 0.91 mg/dL).   Assessment: 83 y.o. female past medical history significant of atrial fibrillation,COPD, chronic diastolic CHF, CAD s/p CABG, gout, mild dementia who presents from home with dyspnea and a fall.  Home dose of warfarin 5mg  daily, except 7.5mg  on Tues and Saturday> re-ordered on admission per MD.  Now pharmacy consulted to manage.  02/10/2017 INR 3.91, supratherapeutic H/H WNL Plts WNL DI: doxycycline   Goal of Therapy:  INR  2-3   Plan:  Hold warfarin today Daily INR  Dolly Rias RPh 02/10/2017, 7:08 AM Pager [phone]

## 2017-02-10 NOTE — Progress Notes (Signed)
Procedure:  Complicated foley placement.  Dx:  Urinary retention with severe introital stenosis.     She was placed frog leg and prepped with betadine.  Sterile towels were placed.   The Urethral meatus couldn't be visualised so the 58fr silastic catheter was guided by a finger in the vagina into the meatus.  After the foley was placed, the balloon was inflated with 93ml of sterile water.  She then drained several hundred ml of clear urine.  There were no complications.  I would leave foley until patient is alert and able to stand.

## 2017-02-11 ENCOUNTER — Other Ambulatory Visit: Payer: Medicare Other

## 2017-02-11 LAB — CULTURE, BLOOD (ROUTINE X 2)
CULTURE: NO GROWTH
Culture: NO GROWTH
Special Requests: ADEQUATE
Special Requests: ADEQUATE

## 2017-02-11 LAB — PROTIME-INR
INR: 3.52
PROTHROMBIN TIME: 35 s — AB (ref 11.4–15.2)

## 2017-02-11 LAB — BASIC METABOLIC PANEL
Anion gap: 9 (ref 5–15)
BUN: 31 mg/dL — AB (ref 6–20)
CHLORIDE: 102 mmol/L (ref 101–111)
CO2: 27 mmol/L (ref 22–32)
CREATININE: 0.99 mg/dL (ref 0.44–1.00)
Calcium: 8.5 mg/dL — ABNORMAL LOW (ref 8.9–10.3)
GFR calc Af Amer: >60 mL/min (ref >60)
GFR calc non Af Amer: 52 mL/min — ABNORMAL LOW (ref >60)
GLUCOSE: 186 mg/dL — AB (ref 65–99)
Potassium: 3.9 mmol/L (ref 3.5–5.1)
SODIUM: 138 mmol/L (ref 135–145)

## 2017-02-11 LAB — CBC
HCT: 40.1 % (ref 36.0–46.0)
HEMOGLOBIN: 13 g/dL (ref 12.0–15.0)
MCH: 29.1 pg (ref 26.0–34.0)
MCHC: 32.4 g/dL (ref 30.0–36.0)
MCV: 89.7 fL (ref 78.0–100.0)
Platelets: 242 10*3/uL (ref 150–400)
RBC: 4.47 MIL/uL (ref 3.87–5.11)
RDW: 15.6 % — ABNORMAL HIGH (ref 11.5–15.5)
WBC: 8.1 10*3/uL (ref 4.0–10.5)

## 2017-02-11 MED ORDER — CEFPODOXIME PROXETIL 200 MG PO TABS
200.0000 mg | ORAL_TABLET | Freq: Two times a day (BID) | ORAL | Status: DC
  Administered 2017-02-11 (×2): 200 mg via ORAL
  Filled 2017-02-11 (×3): qty 1

## 2017-02-11 MED ORDER — WARFARIN - PHARMACIST DOSING INPATIENT
Freq: Every day | Status: DC
  Administered 2017-02-17: 18:00:00

## 2017-02-11 MED ORDER — PREDNISONE 50 MG PO TABS
50.0000 mg | ORAL_TABLET | Freq: Every day | ORAL | Status: DC
  Administered 2017-02-12: 50 mg via ORAL
  Filled 2017-02-11: qty 1

## 2017-02-11 MED ORDER — POLYETHYLENE GLYCOL 3350 17 G PO PACK
17.0000 g | PACK | Freq: Two times a day (BID) | ORAL | Status: AC
  Administered 2017-02-11 – 2017-02-12 (×3): 17 g via ORAL
  Filled 2017-02-11 (×3): qty 1

## 2017-02-11 NOTE — Progress Notes (Signed)
[facility] PROGRESS NOTE    Progress Note  Amber [name]  EYC:[id number] DOB: 1934-12-24 DOA: 02/06/2017 PCP: Antony Contras, MD     Brief Narrative:   Amber [name] is an 83 y.o. female past medical history significant of atrial fibrillation, COPD (although patient and family does not recall this diagnosis being made), chronic diastolic CHF, CAD s/p CABG, gout, mild dementia who presents from home with dyspnea and a fall.Per her daughter, the patient has had a runny nose, sore throat and a cough for about 2 days. Her grandchildren were sick with the same illness a few days ago. She has become steadily short of breath and this morning was breathing with rapid shallow respirations. She was admitted to stepdown unit for respiratory failure, COPD exacerbation/bronchitis.   Assessment/Plan:   Acute on chronic respiratory failure with hypoxia (HCC) Initially require BiPAP will continue to wean off oxygen.  Sirs likely due to acute viral bronchitis and superimposed right upper lobe pneumonia leading to COPD: Patient has a 40-year pack smoking. Respiratory panel was positive for rhinovirus Change IV antibiotics to oral Vantin and doxy. Good air movement and wheezing is improved change steroids to oral. Elevation in lactic acid likely due to hypoxia and less likely due to sepsis. PT recommended home health.  Chronic atrial fibrillation (HCC) On Coumadin INR supra-therapeutic rate control.  Chronic diastolic heart failure: Continue current regimen. She seems to be Euvolemic.  Essential hypertension: Continue valsartan and Norvasc.  Chronic diastolic heart failure (HCC)  Hypothyroidism: Cont  synthroid.  Acute confusional State: Resolved, on Seroquel.  Mild Elevation cardic-biomarkers: EKG showed an unchanged QTC.  2D echo showed no acute changes. Elevation in cardiac biomarkers likely due to demand ischemia.  Acute urinary retention: Continue Foley and give her a  voiding trial.  Likely due to medications.   DVT prophylaxis: lovenox Family Communication:daughter Disposition Plan/Barrier to D/C: Hopefully home in the morning.  Code Status:     Code Status Orders  (From admission, onward)        Start     Ordered   02/06/17 1002  Do not attempt resuscitation (DNR)  Continuous    Question Answer Comment  In the event of cardiac or respiratory ARREST Do not call a "code blue"   In the event of cardiac or respiratory ARREST Do not perform Intubation, CPR, defibrillation or ACLS   In the event of cardiac or respiratory ARREST Use medication by any route, position, wound care, and other measures to relive pain and suffering. May use oxygen, suction and manual treatment of airway obstruction as needed for comfort.      02/06/17 1003    Code Status History    Date Active Date Inactive Code Status Order ID Comments User Context   01/02/2017 00:42 01/03/2017 19:01 Full Code [id number]  Oswald Hillock, MD Inpatient   01/02/2017 00:24 01/02/2017 00:42 DNR [id number]  Oswald Hillock, MD Inpatient   07/27/2014 16:10 07/30/2014 12:41 Full Code [id number]  Luanne Bras, MD Inpatient   06/30/2014 14:21 07/01/2014 03:16 Full Code [id number]  Luanne Bras, MD HOV   02/13/2013 01:11 02/19/2013 14:18 Full Code [id number]  Toy Baker, MD Inpatient        IV Access:    Peripheral IV   Procedures and diagnostic studies:   No results found.   Medical Consultants:    None.  Anti-Infectives:   Rocephin and doxy IV  Subjective:    Amber [name] weight today no new complaints.  Objective:    Vitals:   02/11/17 0148 02/11/17 0500 02/11/17 0757 02/11/17 0759  BP:  (!) 185/90    Pulse:  78    Resp:  19    Temp:  98.7 F (37.1 C)    TempSrc:  Oral    SpO2: 95% 98% 94% 94%  Weight:      Height:        Intake/Output Summary (Last 24 hours) at 02/11/2017 0950 Last data filed at 02/11/2017 0500 Gross per 24 hour  Intake 1100 ml    Output 1000 ml  Net 100 ml   Filed Weights   02/07/17 2157  Weight: 70.3 kg (155 lb 0 oz)    Exam: General exam: In no acute distress. Respiratory system: Good air movement and clear to auscultation. Cardiovascular system: S1 & S2 heard, RRR. -JVD Gastrointestinal system: Abdomen is nondistended, soft and nontender.  Central nervous system: Alert and oriented. No focal neurological deficits. Extremities: No pedal edema. Skin: No rashes, lesions or ulcers    Data Reviewed:    Labs: Basic Metabolic Panel: Recent Labs  Lab 02/07/17 0322 02/08/17 0419 02/09/17 0517 02/10/17 0522 02/11/17 0413  NA 136 134* 137 139 138  K 3.4* 4.0 3.9 4.0 3.9  CL 101 101 102 105 102  CO2 24 23 26 26 27   GLUCOSE 184* 169* 170* 188* 186*  BUN 18 30* 33* 31* 31*  CREATININE 0.86 1.14* 1.03* 0.91 0.99  CALCIUM 8.4* 8.8* 9.1 8.9 8.5*   GFR Estimated Creatinine Clearance: 44.6 mL/min (by C-G formula based on SCr of 0.99 mg/dL). Liver Function Tests: Recent Labs  Lab 02/06/17 0652  AST 32  ALT 16  ALKPHOS 72  BILITOT 1.5*  PROT 8.1  ALBUMIN 4.8   No results for input(s): LIPASE, AMYLASE in the last 168 hours. No results for input(s): AMMONIA in the last 168 hours. Coagulation profile Recent Labs  Lab 02/07/17 0322 02/08/17 0419 02/09/17 0517 02/10/17 0522 02/11/17 0413  INR 2.72 2.65 3.82 3.91 3.52    CBC: Recent Labs  Lab 02/06/17 0652 02/07/17 0322 02/08/17 0419 02/09/17 0517 02/10/17 0522 02/11/17 0413  WBC 15.4* 12.5* 14.7* 12.5* 8.3 8.1  NEUTROABS 13.5*  --   --   --   --   --   HGB 13.8 12.0 12.8 12.2 12.9 13.0  HCT 42.6 37.0 37.8 37.7 39.7 40.1  MCV 90.6 89.6 89.8 90.2 89.8 89.7  PLT 226 183 199 246 228 242   Cardiac Enzymes: Recent Labs  Lab 02/09/17 0111  TROPONINI 0.10*   BNP (last 3 results) Recent Labs    01/23/17 1156  PROBNP 2,015*   CBG: No results for input(s): GLUCAP in the last 168 hours. D-Dimer: No results for input(s): DDIMER  in the last 72 hours. Hgb A1c: No results for input(s): HGBA1C in the last 72 hours. Lipid Profile: No results for input(s): CHOL, HDL, LDLCALC, TRIG, CHOLHDL, LDLDIRECT in the last 72 hours. Thyroid function studies: No results for input(s): TSH, T4TOTAL, T3FREE, THYROIDAB in the last 72 hours.  Invalid input(s): FREET3 Anemia work up: No results for input(s): VITAMINB12, FOLATE, FERRITIN, TIBC, IRON, RETICCTPCT in the last 72 hours. Sepsis Labs: Recent Labs  Lab 02/06/17 0706 02/06/17 1159  02/08/17 0419 02/09/17 0517 02/10/17 0522 02/11/17 0413  WBC  --   --    < > 14.7* 12.5* 8.3 8.1  LATICACIDVEN 2.52* 2.0*  --   --   --   --   --    < > =  values in this interval not displayed.   Microbiology Recent Results (from the past 240 hour(s))  Culture, blood (Routine X 2) w Reflex to ID Panel     Status: None (Preliminary result)   Collection Time: 02/06/17  9:56 AM  Result Value Ref Range Status   Specimen Description BLOOD RIGHT HAND  Final   Special Requests IN PEDIATRIC BOTTLE Blood Culture adequate volume  Final   Culture   Final    NO GROWTH 4 DAYS Performed at [facility], [place] [address], [place], [place] [address]    Report Status PENDING  Incomplete  Respiratory Panel by PCR     Status: Abnormal   Collection Time: 02/06/17 10:00 AM  Result Value Ref Range Status   Adenovirus NOT DETECTED NOT DETECTED Final   Coronavirus 229E NOT DETECTED NOT DETECTED Final   Coronavirus HKU1 NOT DETECTED NOT DETECTED Final   Coronavirus NL63 NOT DETECTED NOT DETECTED Final   Coronavirus OC43 NOT DETECTED NOT DETECTED Final   Metapneumovirus NOT DETECTED NOT DETECTED Final   Rhinovirus / Enterovirus DETECTED (A) NOT DETECTED Final   Influenza A NOT DETECTED NOT DETECTED Final   Influenza B NOT DETECTED NOT DETECTED Final   Parainfluenza Virus 1 NOT DETECTED NOT DETECTED Final   Parainfluenza Virus 2 NOT DETECTED NOT DETECTED Final   Parainfluenza Virus 3 NOT DETECTED NOT  DETECTED Final   Parainfluenza Virus 4 NOT DETECTED NOT DETECTED Final   Respiratory Syncytial Virus NOT DETECTED NOT DETECTED Final   Bordetella pertussis NOT DETECTED NOT DETECTED Final   Chlamydophila pneumoniae NOT DETECTED NOT DETECTED Final   Mycoplasma pneumoniae NOT DETECTED NOT DETECTED Final    Comment: Performed at [facility], [place] [address], [place], [place] [address]  MRSA PCR Screening     Status: None   Collection Time: 02/06/17 11:41 AM  Result Value Ref Range Status   MRSA by PCR NEGATIVE NEGATIVE Final    Comment:        The GeneXpert MRSA Assay (FDA approved for NASAL specimens only), is one component of a comprehensive MRSA colonization surveillance program. It is not intended to diagnose MRSA infection nor to guide or monitor treatment for MRSA infections.   Culture, blood (Routine X 2) w Reflex to ID Panel     Status: None (Preliminary result)   Collection Time: 02/06/17 11:58 AM  Result Value Ref Range Status   Specimen Description BLOOD RIGHT ANTECUBITAL  Final   Special Requests   Final    BOTTLES DRAWN AEROBIC AND ANAEROBIC Blood Culture adequate volume   Culture   Final    NO GROWTH 4 DAYS Performed at [facility], [address], [place],  [address]    Report Status PENDING  Incomplete     Medications:   . allopurinol  300 mg Oral Daily  . amitriptyline  20 mg Oral QHS  . amLODipine  5 mg Oral QHS  . benzonatate  100 mg Oral TID  . cefpodoxime  200 mg Oral Q12H  . celecoxib  200 mg Oral Daily  . citalopram  20 mg Oral BID  . clopidogrel  75 mg Oral Daily  . digoxin  0.125 mg Oral Q M,W,F  . donepezil  10 mg Oral Daily  . doxycycline  100 mg Oral Q12H  . furosemide  40 mg Oral Daily  . guaiFENesin  600 mg Oral BID  . ipratropium-albuterol  3 mL Nebulization Q6H  . levothyroxine  25 mcg Oral QAC breakfast  . losartan  50 mg Oral Daily  . memantine  28 mg Oral Daily  . methylPREDNISolone (SOLU-MEDROL) injection  40  mg Intravenous Q12H  . metoprolol succinate  25 mg Oral Daily  . mometasone-formoterol  2 puff Inhalation BID  . neomycin-bacitracin-polymyxin   Topical Daily  . pantoprazole  40 mg Oral BID AC  . polyethylene glycol  17 g Oral BID  . [START ON 02/12/2017] predniSONE  50 mg Oral Q breakfast  . QUEtiapine  50 mg Oral QHS  . Warfarin - Pharmacist Dosing Inpatient   Does not apply q1800   Continuous Infusions:    LOS: 5 days   [facility] Pager [phone]  *Please refer to [email or web address], password [email or web address] to get updated schedule on who will round on this patient, as hospitalists switch teams weekly. If 7PM-7AM, please contact night-coverage at [email or web address], password [email or web address] for any overnight needs.  02/11/2017, 9:50 AM

## 2017-02-11 NOTE — Progress Notes (Signed)
Discontinued foley as ordered. Pt due to void will monitor. SRP, RN

## 2017-02-11 NOTE — Plan of Care (Signed)
  Progressing Pain Managment: General experience of comfort will improve 02/11/2017 1213 - Progressing by Zadie Rhine, RN   Completed/Met Activity: Risk for activity intolerance will decrease 02/11/2017 1213 - Completed/Met by Zadie Rhine, RN Nutrition: Adequate nutrition will be maintained 02/11/2017 1213 - Completed/Met by Zadie Rhine, RN Coping: Level of anxiety will decrease 02/11/2017 1213 - Completed/Met by Zadie Rhine, RN

## 2017-02-11 NOTE — Progress Notes (Addendum)
[place] for warfarin Indication: atrial fibrillation  Allergies  Allergen Reactions  . Ezetimibe Other (See Comments)    Myalgia  . Welchol [Colesevelam Hcl] Other (See Comments)    Muscle aches  . Adhesive [Tape] Itching, Rash and Other (See Comments)    Burning   . Ceclor [Cefaclor] Other (See Comments)    Unknown allergic reaction  . Elastic Bandages & [Zinc] Rash and Other (See Comments)    Turns red on the areas it touches  . Latex Itching, Rash and Other (See Comments)    Burning   . Lipitor [Atorvastatin Calcium] Other (See Comments)    Increased fibromyalgia pain  . Mevacor [Lovastatin] Other (See Comments)    Increased fibromyalgia pain  . Pravachol Other (See Comments)    Increased fibromyalgia pain  . Vasotec Other (See Comments)    Unknown allergic reaction    Patient Measurements: Height: 5\' 5"  (165.1 cm) Weight: 155 lb 0 oz (70.3 kg) IBW/kg (Calculated) : 57   Vital Signs: Temp: 98.7 F (37.1 C) (01/02 0500) Temp Source: Oral (01/02 0500) BP: 185/90 (01/02 0500) Pulse Rate: 78 (01/02 0500)  Labs: Recent Labs    02/09/17 0111  02/09/17 0517 02/10/17 0522 02/11/17 0413  HGB  --    < > 12.2 12.9 13.0  HCT  --   --  37.7 39.7 40.1  PLT  --   --  246 228 242  LABPROT  --   --  37.3* 38.0* 35.0*  INR  --   --  3.82 3.91 3.52  CREATININE  --   --  1.03* 0.91 0.99  TROPONINI 0.10*  --   --   --   --    < > = values in this interval not displayed.    Estimated Creatinine Clearance: 44.6 mL/min (by C-G formula based on SCr of 0.99 mg/dL).   Assessment: 83 y.o. female past medical history significant of atrial fibrillation,COPD, chronic diastolic CHF, CAD s/p CABG, gout, mild dementia who presents from home with dyspnea and a fall.    Home dose of warfarin 5mg  daily, except 7.5 mg on Tues and Saturday  Today, 02/11/2017:  INR remains supratherapeutic but trending down briskly  Hgb, Plt   Plts WNL  DI:  doxycycline   Goal of Therapy:  INR 2-3   Plan:   Continue to hold warfarin today; expect will be therapeutic tomorrow.  Will likely need reduced dose while acutely ill and on abx  Daily INR  F/u CBC  Reuel Boom, PharmD, BCPS Pager: [phone] 02/11/2017, 8:13 AM

## 2017-02-11 NOTE — Progress Notes (Signed)
Occupational Therapy Treatment Patient Details Name: Amber [name] MRN: [id number] DOB: Oct 07, 1934 Today's Date: 02/11/2017    History of present illness 83 y.o. female admitted on 02/06/17 for SOB, fall at home. dx with acute respiratory distress, bronchitis with hypoxia (h/o COPD) with suspected viral infection (culture results pending). Pt also with severe sepsis, tachycardia, tachypnea, lactic acidosis.  Pt with significant PMH of memory issues, HTN, CVA with left sided weakness, fibromyalgia, dementia, CAD, COPD, chronic diastolic CHF, cervical spine stenosis, A-fib on coumadin, and CABG.     OT comments  Pt progressing. Min a for ambulating and sit to stand as pt is unsteady.  Performed ADL in bathroom  Follow Up Recommendations  Home health OT;Supervision/Assistance - 24 hour    Equipment Recommendations  3 in 1 bedside commode    Recommendations for Other Services      Precautions / Restrictions Precautions Precautions: Fall Precaution Comments: recent h/o falls Required Braces or Orthoses: Other Brace/Splint Other Brace/Splint: left leg bil upright AFO Restrictions Weight Bearing Restrictions: No       Mobility Bed Mobility               General bed mobility comments: pt was at EOB with daughter next to her when I arrived  Transfers   Equipment used: Rolling walker (2 wheeled)   Sit to Stand: Min assist         General transfer comment: light assistance to stand and steady    Balance                                           ADL either performed or assessed with clinical judgement   ADL           Upper Body Bathing: Set up;Sitting   Lower Body Bathing: Minimal assistance;Sit to/from stand   Upper Body Dressing : Set up;Sitting       Toilet Transfer: Minimal assistance;Ambulation;BSC;RW             General ADL Comments: pt with one LB when standing to wash peri area. Pt tends to move quickly.  Min A, steadying when  walking and for sit to stand     Vision        Perception     Praxis      Cognition Arousal/Alertness: Awake/alert Behavior During Therapy: WFL for tasks assessed/performed Overall Cognitive Status: History of cognitive impairments - at baseline                                 General Comments: wfls during session        Exercises     Shoulder Instructions       General Comments      Pertinent Vitals/ Pain       Pain Assessment: No/denies pain  Home Living                                          Prior Functioning/Environment              Frequency  Min 2X/week        Progress Toward Goals  OT Goals(current goals can now be found in the care plan section)  Progress  towards OT goals: Progressing toward goals     Plan      Co-evaluation                 AM-PAC PT "6 Clicks" Daily Activity     Outcome Measure   Help from another person eating meals?: A Little Help from another person taking care of personal grooming?: A Little Help from another person toileting, which includes using toliet, bedpan, or urinal?: A Little Help from another person bathing (including washing, rinsing, drying)?: A Little Help from another person to put on and taking off regular upper body clothing?: A Little Help from another person to put on and taking off regular lower body clothing?: A Lot 6 Click Score: 17    End of Session    OT Visit Diagnosis: Muscle weakness (generalized) (M62.81);Unsteadiness on feet (R26.81)   Activity Tolerance Patient tolerated treatment well   Patient Left in chair;with call bell/phone within reach;with family/visitor present   Nurse Communication          Time: 6286-3817 OT Time Calculation (min): 22 min  Charges: OT General Charges $OT Visit: 1 Visit OT Treatments $Self Care/Home Management : 8-22 mins  Lesle Chris, OTR/L [phone] 02/11/2017   [name] 02/11/2017,  8:44 AM

## 2017-02-11 NOTE — Progress Notes (Signed)
Physical Therapy Treatment Patient Details Name: Amber [name] MRN: [id number] DOB: 23-Sep-1934 Today's Date: 02/11/2017    History of Present Illness 83 y.o. female admitted on 02/06/17 for SOB, fall at home. dx with acute respiratory distress, bronchitis with hypoxia (h/o COPD) with suspected viral infection (culture results pending). Pt also with severe sepsis, tachycardia, tachypnea, lactic acidosis.  Pt with significant PMH of memory issues, HTN, CVA with left sided weakness, fibromyalgia, dementia, CAD, COPD, chronic diastolic CHF, cervical spine stenosis, A-fib on coumadin, and CABG.      PT Comments    Progressing with mobility. Pt continues to require Min assist at times. She was able to don both shoes and L AFO. She required assist to remove one shoe-pt was a little fatigued. O2 sat >90% on RA during ambulation. Discussed d/c plan with daughter. Plan is for pt to return home with elderly husband. Daughter stated she and her sister check in on pt intermittently.     Follow Up Recommendations  Home health PT;Supervision/Assistance - 24 hour     Equipment Recommendations  None recommended by PT    Recommendations for Other Services OT consult     Precautions / Restrictions Precautions Precautions: Fall Precaution Comments: recent h/o falls Required Braces or Orthoses: Other Brace/Splint Other Brace/Splint: left leg bil upright AFO Restrictions Weight Bearing Restrictions: No    Mobility  Bed Mobility Overal bed mobility: Needs Assistance Bed Mobility: Supine to Sit;Sit to Supine     Supine to sit: Supervision;HOB elevated Sit to supine: Supervision;HOB elevated   General bed mobility comments: for safety. Increased time.   Transfers Overall transfer level: Needs assistance Equipment used: Rolling walker (2 wheeled) Transfers: Sit to/from Stand Sit to Stand: Min assist         General transfer comment: light assistance to stand and steady. VCs hand  placement  Ambulation/Gait Ambulation/Gait assistance: Min assist Ambulation Distance (Feet): 250 Feet Assistive device: Rolling walker (2 wheeled) Gait Pattern/deviations: Step-through pattern;Decreased stride length     General Gait Details: Intermittent assist to steady. L LE instability noted. O2 sat >90% on RA during ambulation, dyspnea 2/4.    Stairs            Wheelchair Mobility    Modified Rankin (Stroke Patients Only)       Balance Overall balance assessment: Needs assistance;History of Falls           Standing balance-Leahy Scale: Poor                              Cognition Arousal/Alertness: Awake/alert Behavior During Therapy: WFL for tasks assessed/performed Overall Cognitive Status: History of cognitive impairments - at baseline                                        Exercises      General Comments        Pertinent Vitals/Pain Pain Assessment: No/denies pain    Home Living                      Prior Function            PT Goals (current goals can now be found in the care plan section) Progress towards PT goals: Progressing toward goals    Frequency    Min 3X/week  PT Plan Discharge plan needs to be updated    Co-evaluation              AM-PAC PT "6 Clicks" Daily Activity  Outcome Measure  Difficulty turning over in bed (including adjusting bedclothes, sheets and blankets)?: A Little Difficulty moving from lying on back to sitting on the side of the bed? : A Little Difficulty sitting down on and standing up from a chair with arms (e.g., wheelchair, bedside commode, etc,.)?: Unable Help needed moving to and from a bed to chair (including a wheelchair)?: A Little Help needed walking in hospital room?: A Little Help needed climbing 3-5 steps with a railing? : A Little 6 Click Score: 16    End of Session Equipment Utilized During Treatment: Gait belt Activity Tolerance:  Patient tolerated treatment well Patient left: in bed;with call bell/phone within reach;with family/visitor present   PT Visit Diagnosis: Difficulty in walking, not elsewhere classified (R26.2);Muscle weakness (generalized) (M62.81);History of falling (Z91.81)     Time: 3817-7116 PT Time Calculation (min) (ACUTE ONLY): 19 min  Charges:  $Gait Training: 8-22 mins                    G Codes:          Weston Anna, MPT Pager: [phone]

## 2017-02-12 ENCOUNTER — Inpatient Hospital Stay (HOSPITAL_COMMUNITY): Payer: Medicare Other

## 2017-02-12 LAB — BASIC METABOLIC PANEL
Anion gap: 9 (ref 5–15)
BUN: 36 mg/dL — ABNORMAL HIGH (ref 6–20)
CALCIUM: 8.7 mg/dL — AB (ref 8.9–10.3)
CO2: 28 mmol/L (ref 22–32)
CREATININE: 1.05 mg/dL — AB (ref 0.44–1.00)
Chloride: 101 mmol/L (ref 101–111)
GFR calc Af Amer: 57 mL/min — ABNORMAL LOW (ref >60)
GFR calc non Af Amer: 49 mL/min — ABNORMAL LOW (ref >60)
Glucose, Bld: 225 mg/dL — ABNORMAL HIGH (ref 65–99)
Potassium: 5 mmol/L (ref 3.5–5.1)
SODIUM: 138 mmol/L (ref 135–145)

## 2017-02-12 LAB — PROTIME-INR
INR: 2.99
PROTHROMBIN TIME: 30.9 s — AB (ref 11.4–15.2)

## 2017-02-12 MED ORDER — METHYLPREDNISOLONE SODIUM SUCC 40 MG IJ SOLR
40.0000 mg | Freq: Four times a day (QID) | INTRAMUSCULAR | Status: DC
  Administered 2017-02-12 – 2017-02-13 (×3): 40 mg via INTRAVENOUS
  Filled 2017-02-12 (×4): qty 1

## 2017-02-12 MED ORDER — DEXTROSE 5 % IV SOLN
1.0000 g | INTRAVENOUS | Status: DC
  Administered 2017-02-12 – 2017-02-15 (×4): 1 g via INTRAVENOUS
  Filled 2017-02-12 (×4): qty 10

## 2017-02-12 MED ORDER — POLYETHYLENE GLYCOL 3350 17 G PO PACK
17.0000 g | PACK | Freq: Every day | ORAL | Status: DC
  Administered 2017-02-13: 17 g via ORAL
  Filled 2017-02-12 (×2): qty 1

## 2017-02-12 MED ORDER — WARFARIN SODIUM 2.5 MG PO TABS
2.5000 mg | ORAL_TABLET | Freq: Once | ORAL | Status: AC
  Administered 2017-02-12: 2.5 mg via ORAL
  Filled 2017-02-12: qty 1

## 2017-02-12 MED ORDER — ALBUTEROL SULFATE (2.5 MG/3ML) 0.083% IN NEBU: 2.5000 mg | INHALATION_SOLUTION | Freq: Four times a day (QID) | RESPIRATORY_TRACT | Status: DC

## 2017-02-12 MED ORDER — IPRATROPIUM-ALBUTEROL 0.5-2.5 (3) MG/3ML IN SOLN: 3.0000 mL | Freq: Four times a day (QID) | RESPIRATORY_TRACT | Status: DC

## 2017-02-12 MED ORDER — BISACODYL 10 MG RE SUPP
10.0000 mg | Freq: Once | RECTAL | Status: AC
  Administered 2017-02-12: 10 mg via RECTAL
  Filled 2017-02-12: qty 1

## 2017-02-12 NOTE — Progress Notes (Signed)
Pt c/o of constipation  No BM since 12/29 and started to have large hard solid balls of stool, MD updated orders received. SRP, RN

## 2017-02-12 NOTE — Care Management Note (Signed)
Case Management Note  Patient Details  Name: Amber [name] MRN: [id number] Date of Birth: 1934/08/24  Subjective/Objective:    Acute on chronic respiratory failure                Action/Plan: Plan to discharge home with family and [place]   Expected Discharge Date:  (unknown)               Expected Discharge Plan:  [place]  In-House Referral:     Discharge planning Services  CM Consult  Post Acute Care Choice:    Choice offered to:  Patient  DME Arranged:    DME Agency:     HH Arranged:  RN, PT, Nurse's Aide [place] Agency:  [facility]  Status of Service:  Completed, signed off  If discussed at [place] of Stay Meetings, dates discussed:    Additional CommentsPurcell Mouton, RN 02/12/2017, 3:35 PM

## 2017-02-12 NOTE — Care Management Important Message (Signed)
Important Message  Patient Details  Name: Amber [name] MRN: [id number] Date of Birth: 1934/05/04   Medicare Important Message Given:  Yes    Kerin Salen 02/12/2017, 11:02 AMImportant Message  Patient Details  Name: Amber [name] MRN: [id number] Date of Birth: [birth year]   Medicare Important Message Given:  Yes    Kerin Salen 02/12/2017, 11:02 AM

## 2017-02-12 NOTE — Progress Notes (Signed)
[place] for warfarin Indication: atrial fibrillation  Allergies  Allergen Reactions  . Ezetimibe Other (See Comments)    Myalgia  . Welchol [Colesevelam Hcl] Other (See Comments)    Muscle aches  . Adhesive [Tape] Itching, Rash and Other (See Comments)    Burning   . Ceclor [Cefaclor] Other (See Comments)    Unknown allergic reaction  . Elastic Bandages & [Zinc] Rash and Other (See Comments)    Turns red on the areas it touches  . Latex Itching, Rash and Other (See Comments)    Burning   . Lipitor [Atorvastatin Calcium] Other (See Comments)    Increased fibromyalgia pain  . Mevacor [Lovastatin] Other (See Comments)    Increased fibromyalgia pain  . Pravachol Other (See Comments)    Increased fibromyalgia pain  . Vasotec Other (See Comments)    Unknown allergic reaction    Patient Measurements: Height: 5\' 5"  (165.1 cm) Weight: 155 lb 0 oz (70.3 kg) IBW/kg (Calculated) : 57   Vital Signs: Temp: 97.4 F (36.3 C) (01/03 0540) Temp Source: Oral (01/03 0540) BP: 178/72 (01/03 0540) Pulse Rate: 73 (01/03 0540)  Labs: Recent Labs    02/10/17 0522 02/11/17 0413 02/12/17 0424  HGB 12.9 13.0  --   HCT 39.7 40.1  --   PLT 228 242  --   LABPROT 38.0* 35.0* 30.9*  INR 3.91 3.52 2.99  CREATININE 0.91 0.99  --     Estimated Creatinine Clearance: 44.6 mL/min (by C-G formula based on SCr of 0.99 mg/dL).   Assessment: 83 y.o. female past medical history significant of atrial fibrillation,COPD, chronic diastolic CHF, CAD s/p CABG, gout, mild dementia who presents from home with dyspnea and a fall.    Home dose of warfarin 5mg  daily, except 7.5 mg on Tues and Saturday  Today, 02/12/2017:  INR has improved to upper end of therapeutic range  Last warfarin dose 12/30  CBC 1/2 - Hgb and pltc stable  DI: doxycycline  Home meds celecoxib and clopidogrel increase risk of bleeding with warfarin therapy  Goal of Therapy:  INR 2-3    Plan:   Warfarin 2.5mg  PO x 1 tonight (use 1/2 dose for acute warfarin sensitivity - suspect from acute illness, decrease PO intake, possible interactions with antibiotics).    Daily INR  F/u CBC  Doreene Eland, PharmD, BCPS.   Pager: [phone] 02/12/2017 9:16 AM

## 2017-02-12 NOTE — Progress Notes (Signed)
PROGRESS NOTE    Amber [name]  JKK:[id number] DOB: December 31, 1934 DOA: 02/06/2017 PCP: Antony Contras, MD Brief Narrative: 83 y.o. female past medical history significant of atrial fibrillation,COPD (although patient and family does not recall this diagnosis being made), chronic diastolic CHF, CAD s/p CABG, gout, mild dementia who presents from home with dyspnea and a fall.Per her daughter, the patient has had a runny nose, sore throat and a cough for about 2 days. Her grandchildren were sick with the same illness a few days ago. She has become steadily short of breath and this morning was breathing with rapid shallow respirations. She was admitted to stepdown unit for respiratory failure, COPD exacerbation/bronchitis  02/12/2017-patient and daughter reports that she had a bad night.she was doing well yesterday.she satrted wheezing heavily with sob.her meds were changed to po form yesterday.  Assessment & Plan:   Principal Problem:   Acute on chronic respiratory failure with hypoxia (HCC) Active Problems:   Atrial fibrillation (HCC)   Chronic diastolic heart failure (HCC)   Hypothyroidism   Dementia   SIRS (systemic inflammatory response syndrome) (HCC)   Occult blood in stools   Lactic acidosis  Acute on chronic respiratory failure with hypoxia ([place]) Initially require BiPAP will continue to wean off oxygen.  Sirs likely due to acute viral bronchitis and superimposed right upper lobe pneumonia leading to COPD: Patient has a 40-year pack smoking. Respiratory panel was positive for rhinovirus Chest xray done 02/12/2017 new infiltrtes at [facility].restart iv steroids and rocephin.speech eval. Chronic atrial fibrillation (HCC) On Coumadin INR supra-therapeutic rate control.  Chronic diastolic heart failure: Continue current regimen. She seems to be Euvolemic.  Essential hypertension: Continue valsartan and Norvasc.  Hypothyroidism: Cont  synthroid.   Acute confusional  State: Resolved, on Seroquel.  Mild Elevation cardic-biomarkers: EKG showed an unchanged QTC.  2D echo showed no acute changes. Elevation in cardiac biomarkers likely due to demand ischemia.  Acute urinary retention: Continue Foley and give her a voiding trial.  Likely due to medications    DVT prophylaxis: lovenox Code Status:dnr  Family Communicationdw daughter and grand son Disposition Plan:  tbd Consultants:none  Procedures: none Antimicrobials:rocephin Subjective: Feels worse,breathing hard,sob and coughing and wheezing  Objective: Vitals:   02/12/17 0540 02/12/17 1032 02/12/17 1036 02/12/17 1433  BP: (!) 178/72   (!) 156/65  Pulse: 73   67  Resp: 18   14  Temp: (!) 97.4 F (36.3 C)   98 F (36.7 C)  TempSrc: Oral   Axillary  SpO2: 95% 96% 96% 98%  Weight:      Height:        Intake/Output Summary (Last 24 hours) at 02/12/2017 1500 Last data filed at 02/12/2017 0739 Gross per 24 hour  Intake -  Output 1400 ml  Net -1400 ml   Filed Weights   02/07/17 2157  Weight: 70.3 kg (155 lb 0 oz)    Examination:  General exam: Appears calm and comfortable  Respiratory system: Coarse breath sounds b/l wheezing b/lo auscultation. Respiratory effort normal. Cardiovascular system: S1 & S2 heard, RRR. No JVD, murmurs, rubs, gallops or clicks. No pedal edema. Gastrointestinal system: Abdomen is nondistended, soft and nontender. No organomegaly or masses felt. Normal bowel sounds heard. Central nervous system: Alert and oriented. No focal neurological deficits. Extremities: Symmetric 5 x 5 power. Skin: No rashes, lesions or ulcers Psychiatry: Judgement and insight appear normal. Mood & affect appropriate.     Data Reviewed: I have personally reviewed following labs and imaging studies  CBC: Recent Labs  Lab 02/06/17 0652 02/07/17 0322 02/08/17 0419 02/09/17 0517 02/10/17 0522 02/11/17 0413  WBC 15.4* 12.5* 14.7* 12.5* 8.3 8.1  NEUTROABS 13.5*  --   --   --    --   --   HGB 13.8 12.0 12.8 12.2 12.9 13.0  HCT 42.6 37.0 37.8 37.7 39.7 40.1  MCV 90.6 89.6 89.8 90.2 89.8 89.7  PLT 226 183 199 246 228 562   Basic Metabolic Panel: Recent Labs  Lab 02/07/17 0322 02/08/17 0419 02/09/17 0517 02/10/17 0522 02/11/17 0413  NA 136 134* 137 139 138  K 3.4* 4.0 3.9 4.0 3.9  CL 101 101 102 105 102  CO2 24 23 26 26 27   GLUCOSE 184* 169* 170* 188* 186*  BUN 18 30* 33* 31* 31*  CREATININE 0.86 1.14* 1.03* 0.91 0.99  CALCIUM 8.4* 8.8* 9.1 8.9 8.5*   GFR: Estimated Creatinine Clearance: 44.6 mL/min (by C-G formula based on SCr of 0.99 mg/dL). Liver Function Tests: Recent Labs  Lab 02/06/17 0652  AST 32  ALT 16  ALKPHOS 72  BILITOT 1.5*  PROT 8.1  ALBUMIN 4.8   No results for input(s): LIPASE, AMYLASE in the last 168 hours. No results for input(s): AMMONIA in the last 168 hours. Coagulation Profile: Recent Labs  Lab 02/08/17 0419 02/09/17 0517 02/10/17 0522 02/11/17 0413 02/12/17 0424  INR 2.65 3.82 3.91 3.52 2.99   Cardiac Enzymes: Recent Labs  Lab 02/09/17 0111  TROPONINI 0.10*   BNP (last 3 results) Recent Labs    01/23/17 1156  PROBNP 2,015*   HbA1C: No results for input(s): HGBA1C in the last 72 hours. CBG: No results for input(s): GLUCAP in the last 168 hours. Lipid Profile: No results for input(s): CHOL, HDL, LDLCALC, TRIG, CHOLHDL, LDLDIRECT in the last 72 hours. Thyroid Function Tests: No results for input(s): TSH, T4TOTAL, FREET4, T3FREE, THYROIDAB in the last 72 hours. Anemia Panel: No results for input(s): VITAMINB12, FOLATE, FERRITIN, TIBC, IRON, RETICCTPCT in the last 72 hours. Sepsis Labs: Recent Labs  Lab 02/06/17 0706 02/06/17 1159  LATICACIDVEN 2.52* 2.0*    Recent Results (from the past 240 hour(s))  Culture, blood (Routine X 2) w Reflex to ID Panel     Status: None   Collection Time: 02/06/17  9:56 AM  Result Value Ref Range Status   Specimen Description BLOOD RIGHT HAND  Final   Special  Requests IN PEDIATRIC BOTTLE Blood Culture adequate volume  Final   Culture   Final    NO GROWTH 5 DAYS Performed at [facility], [place] [address], [place], [place] [address]    Report Status 02/11/2017 FINAL  Final  Respiratory Panel by PCR     Status: Abnormal   Collection Time: 02/06/17 10:00 AM  Result Value Ref Range Status   Adenovirus NOT DETECTED NOT DETECTED Final   Coronavirus 229E NOT DETECTED NOT DETECTED Final   Coronavirus HKU1 NOT DETECTED NOT DETECTED Final   Coronavirus NL63 NOT DETECTED NOT DETECTED Final   Coronavirus OC43 NOT DETECTED NOT DETECTED Final   Metapneumovirus NOT DETECTED NOT DETECTED Final   Rhinovirus / Enterovirus DETECTED (A) NOT DETECTED Final   Influenza A NOT DETECTED NOT DETECTED Final   Influenza B NOT DETECTED NOT DETECTED Final   Parainfluenza Virus 1 NOT DETECTED NOT DETECTED Final   Parainfluenza Virus 2 NOT DETECTED NOT DETECTED Final   Parainfluenza Virus 3 NOT DETECTED NOT DETECTED Final   Parainfluenza Virus 4 NOT DETECTED NOT  DETECTED Final   Respiratory Syncytial Virus NOT DETECTED NOT DETECTED Final   Bordetella pertussis NOT DETECTED NOT DETECTED Final   Chlamydophila pneumoniae NOT DETECTED NOT DETECTED Final   Mycoplasma pneumoniae NOT DETECTED NOT DETECTED Final    Comment: Performed at [facility], [place] [address], [place], [place] [address]  MRSA PCR Screening     Status: None   Collection Time: 02/06/17 11:41 AM  Result Value Ref Range Status   MRSA by PCR NEGATIVE NEGATIVE Final    Comment:        The GeneXpert MRSA Assay (FDA approved for NASAL specimens only), is one component of a comprehensive MRSA colonization surveillance program. It is not intended to diagnose MRSA infection nor to guide or monitor treatment for MRSA infections.   Culture, blood (Routine X 2) w Reflex to ID Panel     Status: None   Collection Time: 02/06/17 11:58 AM  Result Value Ref Range Status   Specimen Description BLOOD  RIGHT ANTECUBITAL  Final   Special Requests   Final    BOTTLES DRAWN AEROBIC AND ANAEROBIC Blood Culture adequate volume   Culture   Final    NO GROWTH 5 DAYS Performed at [facility], [address], [place], [place] [address]    Report Status 02/11/2017 FINAL  Final         Radiology Studies: Dg Chest Port 1 View  Result Date: 02/12/2017 CLINICAL DATA:  Shortness of breath. History of COPD, atrial fibrillation, coronary artery disease and CABG, former smoker. EXAM: PORTABLE CHEST 1 VIEW COMPARISON:  Chest x-ray of February 08, 2017 FINDINGS: The lungs are hyperinflated with hemidiaphragm flattening. There is hazy increased density at the left lung base today. There is a small left pleural effusion. The right lung is well-expanded and clear. The heart is top-normal in size. The pulmonary vascularity is normal. There is calcification in the wall of the aortic arch. The sternal wires are intact. IMPRESSION: COPD. Hazy increased density the left lung base more conspicuous today. This suggests subsegmental atelectasis or early pneumonia. There may be a trace of pleural fluid at the left lung base as well. When the patient can tolerate the procedure, a PA and lateral chest x-ray would be useful. Electronically Signed   By: David  Martinique M.D.   On: 02/12/2017 11:06        Scheduled Meds: . albuterol  2.5 mg Nebulization QID  . allopurinol  300 mg Oral Daily  . amitriptyline  20 mg Oral QHS  . amLODipine  5 mg Oral QHS  . benzonatate  100 mg Oral TID  . bisacodyl  10 mg Rectal Once  . celecoxib  200 mg Oral Daily  . citalopram  20 mg Oral BID  . clopidogrel  75 mg Oral Daily  . digoxin  0.125 mg Oral Q M,W,F  . donepezil  10 mg Oral Daily  . furosemide  40 mg Oral Daily  . guaiFENesin  600 mg Oral BID  . ipratropium-albuterol  3 mL Nebulization Q6H  . levothyroxine  25 mcg Oral QAC breakfast  . losartan  50 mg Oral Daily  . memantine  28 mg Oral Daily  . methylPREDNISolone  (SOLU-MEDROL) injection  40 mg Intravenous Q6H  . metoprolol succinate  25 mg Oral Daily  . mometasone-formoterol  2 puff Inhalation BID  . neomycin-bacitracin-polymyxin   Topical Daily  . pantoprazole  40 mg Oral BID AC  . polyethylene glycol  17 g  Oral BID  . polyethylene glycol  17 g Oral Daily  . QUEtiapine  50 mg Oral QHS  . warfarin  2.5 mg Oral ONCE-1800  . Warfarin - Pharmacist Dosing Inpatient   Does not apply q1800   Continuous Infusions: . cefTRIAXone (ROCEPHIN)  IV 1 g (02/12/17 1155)     LOS: 6 days       Georgette Shell, MD [facility]  If 7PM-7AM, please contact night-coverage [email or web address] Password [email or web address] 02/12/2017, 3:00 PM

## 2017-02-13 ENCOUNTER — Inpatient Hospital Stay (HOSPITAL_COMMUNITY): Payer: Medicare Other

## 2017-02-13 ENCOUNTER — Encounter (HOSPITAL_COMMUNITY): Payer: Self-pay | Admitting: Physician Assistant

## 2017-02-13 DIAGNOSIS — J441 Chronic obstructive pulmonary disease with (acute) exacerbation: Secondary | ICD-10-CM

## 2017-02-13 DIAGNOSIS — J9621 Acute and chronic respiratory failure with hypoxia: Secondary | ICD-10-CM

## 2017-02-13 LAB — CBC
HEMATOCRIT: 39.5 % (ref 36.0–46.0)
HEMOGLOBIN: 13.1 g/dL (ref 12.0–15.0)
MCH: 29.2 pg (ref 26.0–34.0)
MCHC: 33.2 g/dL (ref 30.0–36.0)
MCV: 88 fL (ref 78.0–100.0)
Platelets: 261 10*3/uL (ref 150–400)
RBC: 4.49 MIL/uL (ref 3.87–5.11)
RDW: 15.3 % (ref 11.5–15.5)
WBC: 7.9 10*3/uL (ref 4.0–10.5)

## 2017-02-13 LAB — GLUCOSE, CAPILLARY
GLUCOSE-CAPILLARY: 247 mg/dL — AB (ref 65–99)
GLUCOSE-CAPILLARY: 152 mg/dL — AB (ref 65–99)
Glucose-Capillary: 190 mg/dL — ABNORMAL HIGH (ref 65–99)

## 2017-02-13 LAB — PROTIME-INR
INR: 2.08
Prothrombin Time: 23.2 seconds — ABNORMAL HIGH (ref 11.4–15.2)

## 2017-02-13 LAB — OCCULT BLOOD X 1 CARD TO LAB, STOOL: FECAL OCCULT BLD: NEGATIVE

## 2017-02-13 MED ORDER — METHYLPREDNISOLONE SODIUM SUCC 40 MG IJ SOLR
40.0000 mg | Freq: Two times a day (BID) | INTRAMUSCULAR | Status: DC
  Administered 2017-02-13 – 2017-02-15 (×6): 40 mg via INTRAVENOUS
  Filled 2017-02-13 (×5): qty 1

## 2017-02-13 MED ORDER — BISACODYL 10 MG RE SUPP
10.0000 mg | Freq: Once | RECTAL | Status: AC
  Administered 2017-02-14: 10 mg via RECTAL
  Filled 2017-02-13 (×2): qty 1

## 2017-02-13 MED ORDER — AMLODIPINE BESYLATE 10 MG PO TABS
10.0000 mg | ORAL_TABLET | Freq: Every day | ORAL | Status: DC
  Administered 2017-02-13 – 2017-02-16 (×4): 10 mg via ORAL
  Filled 2017-02-13 (×4): qty 1

## 2017-02-13 MED ORDER — BISACODYL 5 MG PO TBEC
10.0000 mg | DELAYED_RELEASE_TABLET | Freq: Every day | ORAL | Status: DC
  Administered 2017-02-13 – 2017-02-17 (×5): 10 mg via ORAL
  Filled 2017-02-13 (×5): qty 2

## 2017-02-13 MED ORDER — IPRATROPIUM BROMIDE 0.03 % NA SOLN
2.0000 | Freq: Three times a day (TID) | NASAL | Status: DC
  Administered 2017-02-13 – 2017-02-17 (×13): 2 via NASAL
  Filled 2017-02-13: qty 30

## 2017-02-13 MED ORDER — AMLODIPINE BESYLATE 5 MG PO TABS
5.0000 mg | ORAL_TABLET | Freq: Once | ORAL | Status: AC
  Administered 2017-02-13: 5 mg via ORAL
  Filled 2017-02-13: qty 1

## 2017-02-13 MED ORDER — FUROSEMIDE 10 MG/ML IJ SOLN
40.0000 mg | Freq: Two times a day (BID) | INTRAMUSCULAR | Status: AC
  Administered 2017-02-13 (×2): 40 mg via INTRAVENOUS
  Filled 2017-02-13 (×2): qty 4

## 2017-02-13 MED ORDER — INSULIN ASPART 100 UNIT/ML ~~LOC~~ SOLN: 0.0000 [IU] | Freq: Every day | SUBCUTANEOUS | Status: DC

## 2017-02-13 MED ORDER — FLUTICASONE PROPIONATE 50 MCG/ACT NA SUSP
2.0000 | Freq: Two times a day (BID) | NASAL | Status: DC
  Administered 2017-02-13 – 2017-02-17 (×8): 2 via NASAL
  Filled 2017-02-13: qty 16

## 2017-02-13 MED ORDER — IPRATROPIUM-ALBUTEROL 0.5-2.5 (3) MG/3ML IN SOLN
3.0000 mL | RESPIRATORY_TRACT | Status: DC
  Administered 2017-02-13 – 2017-02-15 (×9): 3 mL via RESPIRATORY_TRACT
  Filled 2017-02-13 (×9): qty 3

## 2017-02-13 MED ORDER — WARFARIN SODIUM 5 MG PO TABS
5.0000 mg | ORAL_TABLET | Freq: Once | ORAL | Status: AC
  Administered 2017-02-13: 5 mg via ORAL
  Filled 2017-02-13: qty 1

## 2017-02-13 MED ORDER — BISACODYL 10 MG RE SUPP
10.0000 mg | Freq: Once | RECTAL | Status: DC
  Filled 2017-02-13: qty 1

## 2017-02-13 MED ORDER — LACTULOSE 10 GM/15ML PO SOLN
30.0000 g | ORAL | Status: AC
  Filled 2017-02-13: qty 60

## 2017-02-13 MED ORDER — POLYETHYLENE GLYCOL 3350 17 G PO PACK
17.0000 g | PACK | Freq: Every day | ORAL | Status: DC
  Administered 2017-02-13 – 2017-02-17 (×5): 17 g via ORAL
  Filled 2017-02-13 (×5): qty 1

## 2017-02-13 MED ORDER — INSULIN ASPART 100 UNIT/ML ~~LOC~~ SOLN
0.0000 [IU] | Freq: Three times a day (TID) | SUBCUTANEOUS | Status: DC
  Administered 2017-02-13: 3 [IU] via SUBCUTANEOUS
  Administered 2017-02-13 – 2017-02-14 (×2): 2 [IU] via SUBCUTANEOUS
  Administered 2017-02-14: 5 [IU] via SUBCUTANEOUS
  Administered 2017-02-14: 1 [IU] via SUBCUTANEOUS
  Administered 2017-02-15: 3 [IU] via SUBCUTANEOUS
  Administered 2017-02-15: 2 [IU] via SUBCUTANEOUS
  Administered 2017-02-15: 3 [IU] via SUBCUTANEOUS
  Administered 2017-02-16 (×2): 1 [IU] via SUBCUTANEOUS
  Administered 2017-02-16: 3 [IU] via SUBCUTANEOUS
  Administered 2017-02-17 (×2): 1 [IU] via SUBCUTANEOUS
  Administered 2017-02-17: 2 [IU] via SUBCUTANEOUS

## 2017-02-13 MED ORDER — IPRATROPIUM-ALBUTEROL 0.5-2.5 (3) MG/3ML IN SOLN
3.0000 mL | Freq: Four times a day (QID) | RESPIRATORY_TRACT | Status: DC
  Administered 2017-02-13 (×2): 3 mL via RESPIRATORY_TRACT
  Filled 2017-02-13 (×2): qty 3

## 2017-02-13 NOTE — Progress Notes (Signed)
PT demonstrates hands on understanding of Flutter device. 

## 2017-02-13 NOTE — Consult Note (Signed)
Cardiology Consultation:   Patient ID: Amber [name]; [id number]; 25-Apr-1934   Admit date: 02/06/2017 Date of Consult: 02/13/2017  Primary Care Provider: Antony Contras, MD Primary Cardiologist: Dr. Tamala Julian  Chief Complaint: cough  Patient Profile:   Amber [name] is a 83 y.o. female with a hx of CAD s/p CABG 2000, chronic atrial fib on Coumadin, chronic diastolic CHF, CKD stage III, hyperlipidemia (statin intolerant, not previously candidate for PCSK9 due to chronically elevated CK levels), suspected COPD, 40 pyr tobacco abuse, HTN with hypertensive heart disease, cerebrovascular disease, dementia, CVA with left sided weakness, fibromyalgia, hypothyroidism, osteoporosis who is being seen today for the evaluation of CHF at the request of Dr. Rodena Piety.  History of Present Illness:   Last cardiology notes indicate she has been on combination of Plavix and Coumadin for at least the past 5-6 years. Last stress test in 2015 was abnormal but managed medically - medium size of moderate ischemia affecting inferior and inferolateral wall, possible mild scar in inferior wall, reviewed with Dr. Mare Ferrari at that time who had recommended continued treatment with warfarin and Plavix and observation for angina, of which she hasn't had. Of note, this admission counts as the second fall she's had in the last 3 months. A prior fall occurred in the middle of the night sometime in November resulting in a rug burn on her forehead and two black eyes.  She was hospitalized 11/22-11/24 with acute respiratory failure felt due to combo of dCHF, early PNA, bronchitis and was treated with azithromycin, steroids, and increased Lasix dose. She last saw Dr. Tamala Julian 01/23/17 whose note indicated flucutating diuretic doses recently due to CHF and also PCP concern for dehydration as well. He repeated BNP/BMET and BNP was 2K suggested continued fluid overload thus Lasix was continued at higher dose of 40mg  daily and losartan was  increased.   She was admitted 02/06/2017 with dyspnea and a mechanical fall. She had been having a runny nose, sore throat and cough for 2 days. Grandchildren were sick with same illness a few days ago. Breathing worsened, prompting ER trip where she was found to have acute on chronic respiratory failure requiring bipap due to hypoxia and respiratory fatigue. She was found to have heme positive stools, leukocytosis, lactic acidosis/SIRS felt due to acute viral bronchitis with superimposed RUL PNA (seen on CXR 12/30) leading to COPD exacerbation. Resp panel + rhinovirus. CT head non-acute. She initially improved and measures were scaled back with plans for DC. However, yesterday she developed increased congestion and dyspnea. Repeat CXR showed new hazy density at the left base, with possible trace left pleural fluid as well, requiring restarting IV steroids and Rocephin. She feels marginally better today but still very congested with raspy cough but very little production at this point. No CP or edema. + chronic orthopnea x 6 years. 2D Echo 02/09/17 showed EF 60-65%, no RWMA, mild aortic stenosis, mild mitral stenosis, moderate LAE, normal RV, PASP 74mmHg, elevated CVP. No recent weight since admission. I/O's will need to be ordered.  Most recent labs show BUN/Cr 36/1.05, K 5, Hgb 13.1, INR now therapeutic (previously supra), normal CBC. Troponin 0.10 earlier in admission with BNP 245. Vitals notable for normal O2 sat, normal HR, and hypertensive up to 177/70.   Past Medical History:  Diagnosis Date  . Abnormality of gait 08/02/2014  . Cerebrovascular disease 05/21/2016  . Cervical spinal stenosis   . Chronic atrial fibrillation (HCC)    a. chronic/rate controlled;  b. chronic coumadin.  [name]  Chronic diastolic CHF (congestive heart failure) ([place])    a. 02/2013 Echo: EF 55-60%, mild AI, mod dil LA.  . CKD (chronic kidney disease), stage III ([place])   . COPD with emphysema ([place])    PFT 05/02/10>>FEV1  1.35(62%), FEV1% 66, DLCO 75%  . Coronary artery disease    a. s/p CABG;  b. Abnormal nuc 2015 - managed medically.  . CVA (cerebral vascular accident) ([place])    left sided weakness  . Dementia   . Depression   . Fibromyalgia   . GERD (gastroesophageal reflux disease)    pepcid 2-3 times per week  . Gout   . Hemiparesis and alteration of sensations as late effects of stroke ([place]) 01/17/2015  . History of melanoma    squamous cell, melanoma  . Hyperlipidemia    a. statin intolerant, not felt to be candidate for PCSK9 due to chronically elevated CK levels.  . Hypertension   . Hypertensive heart disease   . Hypothyroidism   . Insomnia   . Intermittent confusion   . Memory change 01/23/2014  . Nasal polyposis   . Osteoarthritis   . Osteoporosis   . Pneumonia    1990  . Tobacco abuse     Past Surgical History:  Procedure Laterality Date  . ABDOMINAL HYSTERECTOMY    . BREAST LUMPECTOMY  1980s   Benign lesion - right  . carpel tunnel     right  . CORONARY ARTERY BYPASS GRAFT  2000  . EYE SURGERY     bilateral cataracts  . IR GENERIC HISTORICAL  03/31/2016   IR RADIOLOGIST EVAL & MGMT 03/31/2016 [facility]  . RADIOLOGY WITH ANESTHESIA  12/24/2011   Procedure: RADIOLOGY WITH ANESTHESIA;  Surgeon: Medication Radiologist, MD;  Location: [facility];  Service: Radiology;  Laterality: N/A;  Extra Cranial Vascular Stent  . RADIOLOGY WITH ANESTHESIA N/A 07/19/2014   Procedure: ANGIOPLASTY;  Surgeon: Luanne Bras, MD;  Location: [facility];  Service: Radiology;  Laterality: N/A;  . RADIOLOGY WITH ANESTHESIA N/A 07/27/2014   Procedure: ANGIOPLASTY;  Surgeon: Luanne Bras, MD;  Location: [facility];  Service: Radiology;  Laterality: N/A;  . TONSILLECTOMY       Inpatient Medications: Scheduled Meds: . allopurinol  300 mg Oral Daily  . amitriptyline  20 mg Oral QHS  . amLODipine  5 mg Oral QHS  . benzonatate  100 mg Oral TID  . bisacodyl  10 mg Rectal Once  . celecoxib  200 mg Oral Daily    . citalopram  20 mg Oral BID  . clopidogrel  75 mg Oral Daily  . digoxin  0.125 mg Oral Q M,W,F  . donepezil  10 mg Oral Daily  . furosemide  40 mg Oral Daily  . guaiFENesin  600 mg Oral BID  . ipratropium-albuterol  3 mL Nebulization Q6H  . levothyroxine  25 mcg Oral QAC breakfast  . losartan  50 mg Oral Daily  . memantine  28 mg Oral Daily  . methylPREDNISolone (SOLU-MEDROL) injection  40 mg Intravenous Q12H  . metoprolol succinate  25 mg Oral Daily  . mometasone-formoterol  2 puff Inhalation BID  . neomycin-bacitracin-polymyxin   Topical Daily  . pantoprazole  40 mg Oral BID AC  . polyethylene glycol  17 g Oral Daily  . QUEtiapine  50 mg Oral QHS  . warfarin  5 mg Oral ONCE-1800  . Warfarin - Pharmacist Dosing Inpatient   Does not apply q1800   Continuous Infusions: . cefTRIAXone (ROCEPHIN)  IV  Stopped (02/12/17 1532)   PRN Meds: acetaminophen **OR** acetaminophen, haloperidol lactate, ondansetron **OR** ondansetron (ZOFRAN) IV  Home Meds: Prior to Admission medications   Medication Sig Start Date End Date Taking? Authorizing Provider  albuterol (PROVENTIL HFA;VENTOLIN HFA) 108 (90 Base) MCG/ACT inhaler Inhale 2 puffs into the lungs every 4 (four) hours as needed for wheezing or shortness of breath. 07/31/16  Yes Joy, Shawn C, PA-C  allopurinol (ZYLOPRIM) 300 MG tablet Take 1 tablet (300 mg total) by mouth every other day. Patient taking differently: Take 300 mg by mouth daily.  03/06/16  Yes Burtis Junes, NP  amitriptyline (ELAVIL) 10 MG tablet TAKE 2 TABLETS BY MOUTH AT BEDTIME Patient taking differently: TAKE 20mg   BY MOUTH AT BEDTIME 09/01/16  Yes Kathrynn Ducking, MD  amLODipine (NORVASC) 5 MG tablet Take 1 tablet (5 mg total) by mouth at bedtime. 01/12/17  Yes Belva Crome, MD  celecoxib (CELEBREX) 200 MG capsule Take 200 mg by mouth every morning.    Yes [provider]  citalopram (CELEXA) 20 MG tablet TAKE 1 TABLET BY MOUTH TWICE A DAY 11/09/14  Yes  Darlin Coco, MD  clopidogrel (PLAVIX) 75 MG tablet TAKE 1 TABLET (75 MG TOTAL) BY MOUTH DAILY. 02/04/17  Yes Belva Crome, MD  COUMADIN 5 MG tablet TAKE AS DIRECTED BY COUMADIN CLINIC. Hold coumadin November 24 th and November 25 th.  Recheck INR on Monday. Patient taking differently: Take 5-7.5 mg by mouth daily. Take 5 mg everyday except 7.5 mg on Tuesday and Saturday 01/03/17  Yes Hosie Poisson, MD  digoxin (LANOXIN) 0.125 MG tablet Take 1 tablet (0.125 mg total) by mouth on Mondays, Wednesdays, & Fridays 03/27/16  Yes Belva Crome, MD  donepezil (ARICEPT) 10 MG tablet Take 1 tablet (10 mg total) by mouth at bedtime. Patient taking differently: Take 10 mg by mouth daily.  06/13/16  Yes Kathrynn Ducking, MD  ergocalciferol (VITAMIN D2) 50000 UNITS capsule Take 50,000 Units by mouth 2 (two) times a week. Tuesday and Friday   Yes [provider]  furosemide (LASIX) 40 MG tablet Take 1 tablet (40 mg total) by mouth daily. 01/04/17  Yes Hosie Poisson, MD  KLOR-CON M20 20 MEQ tablet Take 20 mEq by mouth daily.  03/11/16  Yes [provider]  levothyroxine (SYNTHROID, LEVOTHROID) 25 MCG tablet Take 25 mcg by mouth every morning.    Yes [provider]  losartan (COZAAR) 50 MG tablet Take 1 tablet (50 mg total) by mouth daily. 01/27/17 01/22/18 Yes Belva Crome, MD  memantine (NAMENDA XR) 28 MG CP24 24 hr capsule TAKE ONE CAPSULE BY MOUTH EVERY DAY 07/15/16  Yes Kathrynn Ducking, MD  metoprolol succinate (TOPROL-XL) 25 MG 24 hr tablet TAKE 1 TABLET (25 MG TOTAL) BY MOUTH DAILY. Patient taking differently: TAKE 1 TABLET (25 MG TOTAL) BY MOUTH DAILY. Morning 05/16/16  Yes Belva Crome, MD  nitroGLYCERIN (NITROSTAT) 0.4 MG SL tablet Place 1 tablet (0.4 mg total) under the tongue every 5 (five) minutes as needed for chest pain. 09/13/12  Yes Darlin Coco, MD  PROAIR HFA 108 (90 BASE) MCG/ACT inhaler INHALE 2 PUFFS INTO LUNGS EVERY 6 HOURS AS NEEDED FOR WHEEZING Patient  not taking: Reported on 02/06/2017 12/22/13   Darlin Coco, MD    Allergies:    Allergies  Allergen Reactions  . Ezetimibe Other (See Comments)    Myalgia  . Welchol [Colesevelam Hcl] Other (See Comments)    Muscle aches  .  Adhesive [Tape] Itching, Rash and Other (See Comments)    Burning   . Ceclor [Cefaclor] Other (See Comments)    Unknown allergic reaction  . Elastic Bandages & [Zinc] Rash and Other (See Comments)    Turns red on the areas it touches  . Latex Itching, Rash and Other (See Comments)    Burning   . Lipitor [Atorvastatin Calcium] Other (See Comments)    Increased fibromyalgia pain  . Mevacor [Lovastatin] Other (See Comments)    Increased fibromyalgia pain  . Pravachol Other (See Comments)    Increased fibromyalgia pain  . Vasotec Other (See Comments)    Unknown allergic reaction    Social History:   Social History   Socioeconomic History  . Marital status: Married    Spouse name: Not on file  . Number of children: 2  . Years of education: Not on file  . Highest education level: Not on file  Social Needs  . Financial resource strain: Not on file  . Food insecurity - worry: Not on file  . Food insecurity - inability: Not on file  . Transportation needs - medical: Not on file  . Transportation needs - non-medical: Not on file  Occupational History  . 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 Occupation: Network engineer    Comment: Retired  Tobacco Use  . Smoking status: Former Smoker    Packs/day: 1.50    Years: 25.00    Pack years: 37.50    Types: Cigarettes    Last attempt to quit: 02/10/1977    Years since quitting: 40.0  . Smokeless tobacco: Never Used  Substance and Sexual Activity  . Alcohol use: No  . Drug use: No  . Sexual activity: Not on file  Other Topics Concern  . Not on file  Social History Narrative   Patient is right handed.   Patient lives at home with husband   Patient drinks 8 oz caffeine  occasionally.    Family History:   The patient's family history includes CAD in her brother and father; Diabetes type II in her brother; Stroke in her mother.  ROS:  Please see the history of present illness.  All other ROS reviewed and negative.     Physical Exam/Data:   Vitals:   02/12/17 1947 02/12/17 2153 02/13/17 0646 02/13/17 0835  BP:  (!) 169/62 (!) 177/70   Pulse:  67 72   Resp:  16 18   Temp:  (!) 97.5 F (36.4 C) 97.7 F (36.5 C)   TempSrc:  Oral Oral   SpO2: 96% 97% 96% 97%  Weight:      Height:        Intake/Output Summary (Last 24 hours) at 02/13/2017 1000 Last data filed at 02/13/2017 0600 Gross per 24 hour  Intake 170 ml  Output -  Net 170 ml   Filed Weights   02/07/17 2157  Weight: 155 lb 0 oz (70.3 kg)   Body mass index is 25.79 kg/m.  General: Chronically ill frail elderly WFin no acute distress. In good spirits Head: Normocephalic, atraumatic, sclera non-icteric, no xanthomas, nares are without discharge.  Neck: Negative for carotid bruits. JVD not elevated. Lungs: Diffusely rhonchorous anteriorly, cleared with coughing. Bilateral crackles at bases. Breathing is unlabored on . Productive sounding cough but no sputum expectorated Heart: irregularly irregular, rate controlled with S1 S2. No murmurs, rubs, or gallops appreciated. Abdomen: Soft, non-tender, non-distended with normoactive bowel sounds. No hepatomegaly. No rebound/guarding. No  obvious abdominal masses. Msk:  Generalized atrophy of extremities noted Extremities: No clubbing or cyanosis. No edema.  Distal pedal pulses are 2+ and equal bilaterally. Neuro: Alert and oriented X 3. No facial asymmetry. No focal deficit. Moves all extremities spontaneously. Psych:  Responds to questions appropriately with a normal affect.  EKG:  The EKG was personally reviewed and demonstrates Atrial fib rate controlled diffuse nonspecific ST-T changes. Most recent QTc 454ms. Anterior TWI seems to flucutate in  prior tracings as well (see 07/2016).   Relevant CV Studies:as above  Laboratory Data:  Chemistry Recent Labs  Lab 02/10/17 0522 02/11/17 0413 02/12/17 1559  NA 139 138 138  K 4.0 3.9 5.0  CL 105 102 101  CO2 26 27 28   GLUCOSE 188* 186* 225*  BUN 31* 31* 36*  CREATININE 0.91 0.99 1.05*  CALCIUM 8.9 8.5* 8.7*  GFRNONAA 58* 52* 49*  GFRAA >60 >60 57*  ANIONGAP 8 9 9     Hematology Recent Labs  Lab 02/10/17 0522 02/11/17 0413 02/13/17 0442  WBC 8.3 8.1 7.9  RBC 4.42 4.47 4.49  HGB 12.9 13.0 13.1  HCT 39.7 40.1 39.5  MCV 89.8 89.7 88.0  MCH 29.2 29.1 29.2  MCHC 32.5 32.4 33.2  RDW 15.6* 15.6* 15.3  PLT 228 242 261   Cardiac Enzymes Recent Labs  Lab 02/09/17 0111  TROPONINI 0.10*   No results for input(s): TROPIPOC in the last 168 hours.   Radiology/Studies:  Dg Chest Port 1 View  Result Date: 02/12/2017 CLINICAL DATA:  Shortness of breath. History of COPD, atrial fibrillation, coronary artery disease and CABG, former smoker. EXAM: PORTABLE CHEST 1 VIEW COMPARISON:  Chest x-ray of February 08, 2017 FINDINGS: The lungs are hyperinflated with hemidiaphragm flattening. There is hazy increased density at the left lung base today. There is a small left pleural effusion. The right lung is well-expanded and clear. The heart is top-normal in size. The pulmonary vascularity is normal. There is calcification in the wall of the aortic arch. The sternal wires are intact. IMPRESSION: COPD. Hazy increased density the left lung base more conspicuous today. This suggests subsegmental atelectasis or early pneumonia. There may be a trace of pleural fluid at the left lung base as well. When the patient can tolerate the procedure, a PA and lateral chest x-ray would be useful. Electronically Signed   By: David  Martinique M.D.   On: 02/12/2017 11:06    Assessment and Plan:   1. Acute (on reported chronic) respiratory failure with hypoxia due to viral bronchitis/rhinovirus with superimposed RUL  PNA and possible subsequent development of LLL PNA - per IM.  2. ?Possible acute on chronic diastolic CHF - truly difficult to discern based on available information and pulmonary exam. Exam is notable for diffuse rhonchi suggestive of ongoing pulm infection as well as bibasilar crackles. No recent weights or complete I/O's on file, will order. It is certainly feasible (and common) that she could have gone into a little bit of heart failure with her PNA. CXR suggests some pleural fluid. Will give Lasix 40mg  IV BID x 2 doses today and reassess in AM with BNP and BMET. Patient's daughter also states her family has been really pushing fluid intake this admission to help her get over her PNA and wonders if this is contributing. Will put 2L fluid/low sodium modifier on her diet.  3. CAD - remote bypass; abnormal stress test 2015. EKG appears chronically abnormal. She's not had any chest pain. Suspect mild troponin elevation  was due to acute respiratory distress. LVEF is preserved. She has historically been maintained on Plavix for many years. At this point with two significant falls and heme + stools this admission it would seem that risk of continuing Plavix outweighs benefit. Will stop Plavix. Continue Coumadin but recommend further attention of heme + w/u to IM.  4. Chronic atrial fib - continued on Coumadin per pharmacy. Check digoxin level in AM. Digoxin has fallen out of long-term favor for afib patients given increased mortality data. However, recorded vital rates appear to have been well controlled while acutely ill, so this can be revisited as OP with Dr. Tamala Julian. Will place her on telemetry given CHF to make sure there are no underlying episodes of high rates contributing.  5. Mild AS/MS - follow clinically. EF normal.  6. HTN - follow BP with increase in Lasix. Will also increase amlodipine to 10mg  daily.  7. CKD stage III - not sure she should be on Celebrex given her cardiac and renal diagnoses.  Will defer to IM.  For questions or updates, please contact [facility] Please consult [email or web address] for contact info under Cardiology/STEMI.    Signed, Charlie Pitter, PA-C  02/13/2017 10:00 AM   Attending Note:   The patient was seen and examined.  Agree with assessment and plan as noted above.  Changes made to the above note as needed.  Patient seen and independently examined with Melina Copa, PA .   We discussed all aspects of the encounter. I agree with the assessment and plan as stated above.  1.  Possible CHF - Pt was admmitted with viral pneumonia.  Its very common to develop transient superimposed CHF on top of a viral illness.  Would give Lasix 40 mg IV bid for a day or so and reassess. Once she is over this acute viral illness, she will probably be able to go back on her usual dose of lasix   2. Chronic atrial fib:  Continue coumadin  3.   CAD -= no angina     I have spent a total of 40 minutes with patient reviewing hospital  notes , telemetry, EKGs, labs and examining patient as well as establishing an assessment and plan that was discussed with the patient. > 50% of time was spent in direct patient care.    Thayer Headings, Brooke Bonito., MD, [facility] 02/13/2017, 10:55 AM [address],  [place] Pager [phone]

## 2017-02-13 NOTE — Evaluation (Signed)
Clinical/Bedside Swallow Evaluation Patient Details  Name: Amber [name] MRN: [id number] Date of Birth: 11-17-1934  Today's Date: 02/13/2017 Time: SLP Start Time (ACUTE ONLY): 0950 SLP Stop Time (ACUTE ONLY): 1027 SLP Time Calculation (min) (ACUTE ONLY): 37 min  Past Medical History:  Past Medical History:  Diagnosis Date  . Abnormality of gait 08/02/2014  . Cerebrovascular disease 05/21/2016  . Cervical spinal stenosis   . Chronic atrial fibrillation (HCC)    a. chronic/rate controlled;  b. chronic coumadin.  . Chronic diastolic CHF (congestive heart failure) ([place])    a. 02/2013 Echo: EF 55-60%, mild AI, mod dil LA.  . CKD (chronic kidney disease), stage III ([place])   . COPD with emphysema ([place])    PFT 05/02/10>>FEV1 1.35(62%), FEV1% 66, DLCO 75%  . Coronary artery disease    a. s/p CABG;  b. Abnormal nuc 2015 - managed medically.  . CVA (cerebral vascular accident) ([place])    left sided weakness  . Dementia   . Depression   . Fibromyalgia   . GERD (gastroesophageal reflux disease)    pepcid 2-3 times per week  . Gout   . Hemiparesis and alteration of sensations as late effects of stroke ([place]) 01/17/2015  . History of melanoma    squamous cell, melanoma  . Hyperlipidemia    a. statin intolerant, not felt to be candidate for PCSK9 due to chronically elevated CK levels.  . Hypertension   . Hypertensive heart disease   . Hypothyroidism   . Insomnia   . Intermittent confusion   . Memory change 01/23/2014  . Nasal polyposis   . Osteoarthritis   . Osteoporosis   . Pneumonia    1990  . Tobacco abuse    Past Surgical History:  Past Surgical History:  Procedure Laterality Date  . ABDOMINAL HYSTERECTOMY    . BREAST LUMPECTOMY  1980s   Benign lesion - right  . carpel tunnel     right  . CORONARY ARTERY BYPASS GRAFT  2000  . EYE SURGERY     bilateral cataracts  . IR GENERIC HISTORICAL  03/31/2016   IR RADIOLOGIST EVAL & MGMT 03/31/2016 [facility]-INTERV RAD  . RADIOLOGY WITH  ANESTHESIA  12/24/2011   Procedure: RADIOLOGY WITH ANESTHESIA;  Surgeon: Medication Radiologist, MD;  Location: [place];  Service: Radiology;  Laterality: N/A;  Extra Cranial Vascular Stent  . RADIOLOGY WITH ANESTHESIA N/A 07/19/2014   Procedure: ANGIOPLASTY;  Surgeon: Luanne Bras, MD;  Location: [place];  Service: Radiology;  Laterality: N/A;  . RADIOLOGY WITH ANESTHESIA N/A 07/27/2014   Procedure: ANGIOPLASTY;  Surgeon: Luanne Bras, MD;  Location: [place];  Service: Radiology;  Laterality: N/A;  . TONSILLECTOMY     HPI:  pt is an 83 yo female adm to [facility] with respiratory difficulties and weakness, s/p fall.  Pt found to have rhinovirus and now with concerns for RUL and left lobe pna.  PMH + for h/o smoking from age [age], dementia, cva in 2001 with left sided weakness, cervical spinal stenosis, Afib, COPD, bronchitis.  Pt also was around recent sick contact prior to admission.  Pt denies dysphagia as does her daughter.  Pt constantly wiping her nose during session.  Neck imaging showed Endplate degenerative changes are seen anteriorly from C4-C5, C7-T1.     Assessment / Plan / Recommendation Clinical Impression  Pt with negative cranial nerve exam.  No indication of aspiration/penetration with po observed *thin coffee, yogurt.  Pt with timely swallow with clear voice throughout.  Pt  does have h/o prominent cricopharyngeus and daughter informed to findings/recommendations.   Daughter inquired regarding possible "dye test" or barium test.   At this time, educated pt to recommendation not to have instrumental swallow test as pt abdomen is distended and pt has not had a "good bowel movement" for a week.  Will follow up Monday for education and to assure tolerance.  Advised pt and daughter to take medications with puree due increased difficulty with mixed consistencies and pt work of breathing.   SLP Visit Diagnosis: Dysphagia, unspecified (R13.10)    Aspiration Risk  Moderate aspiration risk     Diet Recommendation Regular;Thin liquid   Liquid Administration via: Cup;Straw Medication Administration: Whole meds with puree Supervision: Patient able to self feed Compensations: Minimize environmental distractions;Slow rate;Small sips/bites Postural Changes: Seated upright at 90 degrees    Other  Recommendations Oral Care Recommendations: Oral care BID   Follow up Recommendations        Frequency and Duration min 1 x/week  1 week       Prognosis Prognosis for Safe Diet Advancement: Fair Barriers to Reach Goals: Behavior      Swallow Study   General Date of Onset: 02/13/17 HPI: pt is an 83 yo female adm to [facility] with respiratory difficulties and weakness, s/p fall.  Pt found to have rhinovirus and now with concerns for RUL and left lobe pna.  PMH + for h/o smoking from age [age], dementia, cva in 2001 with left sided weakness, cervical spinal stenosis, Afib, COPD, bronchitis.  Pt also was around recent sick contact prior to admission.  Pt denies dysphagia as does her daughter.  Pt constantly wiping her nose during session.  Neck imaging showed Endplate degenerative changes are seen anteriorly from C4-C5, C7-T1.   Type of Study: Bedside Swallow Evaluation Previous Swallow Assessment: MBS 03/1999 prominent cricopharyngeus, no aspiration or penetration = ordered by rehab MD, thus supect this was after her cva Diet Prior to this Study: Regular;Thin liquids Temperature Spikes Noted: No Respiratory Status: Room air History of Recent Intubation: No Behavior/Cognition: Alert;Cooperative;Pleasant mood Oral Cavity Assessment: Within Functional Limits Oral Care Completed by SLP: No Oral Cavity - Dentition: Adequate natural dentition(lowers, upper dentures not in place) Vision: Functional for self-feeding Self-Feeding Abilities: Able to feed self Patient Positioning: Upright in bed Baseline Vocal Quality: Low vocal intensity Volitional Cough: Strong Volitional Swallow: Able to elicit     Oral/Motor/Sensory Function Overall Oral Motor/Sensory Function: Generalized oral weakness   Ice Chips Ice chips: Not tested   Thin Liquid Thin Liquid: Within functional limits Presentation: Cup    Nectar Thick Nectar Thick Liquid: Not tested   Honey Thick Honey Thick Liquid: Not tested   Puree Puree: Within functional limits Presentation: Self Fed;Spoon   Solid   GO   Solid: Not tested Other Comments: session interrupted twice by cardiology- did not see pt with solid but she and her daughter deny swallow deficits        [name] 02/13/2017,11:01 AM  Luanna Salk, [place] [facility] SLP [phone]

## 2017-02-13 NOTE — Progress Notes (Signed)
Occupational Therapy Treatment Patient Details Name: Amber [name] MRN: [id number] DOB: March 24, 1934 Today's Date: 02/13/2017    History of present illness 83 y.o. female admitted on 02/06/17 for SOB, fall at home. dx with acute respiratory distress, bronchitis with hypoxia (h/o COPD) with suspected viral infection (culture results pending). Pt also with severe sepsis, tachycardia, tachypnea, lactic acidosis.  Pt with significant PMH of memory issues, HTN, CVA with left sided weakness, fibromyalgia, dementia, CAD, COPD, chronic diastolic CHF, cervical spine stenosis, A-fib on coumadin, and CABG.     OT comments  Pt progressing towards goals. Completing bathing and toileting upon entering room; Pt overall requiring ModA for toileting, MinA for functional mobility within room at RW level. Pt will benefit from continued acute OT services and [place] services to maximize her safety and independence with ADLs and mobility after discharge home.    Follow Up Recommendations  Home health OT;Supervision/Assistance - 24 hour    Equipment Recommendations  3 in 1 bedside commode          Precautions / Restrictions Precautions Precautions: Fall Precaution Comments: recent h/o falls Required Braces or Orthoses: Other Brace/Splint Other Brace/Splint: left leg bil upright AFO Restrictions Weight Bearing Restrictions: No       Mobility Bed Mobility               General bed mobility comments: OOB in bathroom upon arrival   Transfers Overall transfer level: Needs assistance Equipment used: Rolling walker (2 wheeled) Transfers: Sit to/from Stand Sit to Stand: Min assist         General transfer comment: light assistance to stand and steady. VCs hand placement    Balance Overall balance assessment: Needs assistance;History of Falls           Standing balance-Leahy Scale: Poor                             ADL either performed or assessed with clinical judgement   ADL  Overall ADL's : Needs assistance/impaired                         Toilet Transfer: Minimal assistance;Ambulation;RW;Regular Toilet   Toileting- Clothing Manipulation and Hygiene: Maximal assistance;Sit to/from stand       Functional mobility during ADLs: Minimal assistance;Rolling walker General ADL Comments: Pt seated on toilet with NT and daughter completing bathing/toileting upon entering room; Pt required assist for peri-care after small BM and MinA/verbal cues to maintain upright position while standing, able to initiate pulling up briefs after toileting though requires assist to fully don; Pt sat on BSC against sink to wash hair with setup assist using shampoo cap; Pt then ambulated to room using RW with MinA, assisted Pt to step onto scale for NT to take Pt's wt, Pt then transferred to recliner, MinA throughout                       Cognition Arousal/Alertness: Awake/alert Behavior During Therapy: [facility] for tasks assessed/performed Overall Cognitive Status: History of cognitive impairments - at baseline                                 General Comments: wfls during session                      Pertinent Vitals/ Pain  Pain Assessment: No/denies pain                                                          Frequency  Min 2X/week        Progress Toward Goals  OT Goals(current goals can now be found in the care plan section)  Progress towards OT goals: Progressing toward goals  Acute Rehab OT Goals Patient Stated Goal: daughter wants her to breathe better and stop falling and return home OT Goal Formulation: With patient/family Time For Goal Achievement: 02/21/17 Potential to Achieve Goals: Good  Plan Discharge plan remains appropriate                     AM-PAC PT "6 Clicks" Daily Activity     Outcome Measure   Help from another person eating meals?: A Little Help from another person  taking care of personal grooming?: A Little Help from another person toileting, which includes using toliet, bedpan, or urinal?: A Little Help from another person bathing (including washing, rinsing, drying)?: A Little Help from another person to put on and taking off regular upper body clothing?: A Little Help from another person to put on and taking off regular lower body clothing?: A Lot 6 Click Score: 17    End of Session Equipment Utilized During Treatment: Rolling walker;Gait belt  OT Visit Diagnosis: Muscle weakness (generalized) (M62.81);Unsteadiness on feet (R26.81)   Activity Tolerance Patient tolerated treatment well   Patient Left in chair;with call bell/phone within reach;with family/visitor present   Nurse Communication Mobility status        Time: 2637-8588 OT Time Calculation (min): 22 min  Charges: OT General Charges $OT Visit: 1 Visit OT Treatments $Self Care/Home Management : 8-22 mins  Lou Cal, OT Pager [phone] 02/13/2017    Raymondo Band 02/13/2017, 12:27 PM

## 2017-02-13 NOTE — Progress Notes (Signed)
PROGRESS NOTE    Amber [name]  TKZ:[id number] DOB: 1934/05/04 DOA: 02/06/2017 PCP: Antony Contras, MD    Brief Narrative:83 y.o.femalepast medical history significant of atrial fibrillation,COPD (although patient and family does not recall this diagnosis being made), chronic diastolic CHF, CAD s/p CABG, gout, mild dementia who presents from home with dyspnea and a fall.Per her daughter, the patient has had a runny nose, sore throat and a cough for about 2 days. Her grandchildren were sick with the same illness a few days ago. She has become steadily short of breath and this morning was breathing with rapid shallow respirations. She was admitted to stepdown unit for respiratory failure, COPD exacerbation/bronchitis  02/12/2017-patient and daughter reports that she had a bad night.she was doing well yesterday.she satrted wheezing heavily with sob.her meds were changed to po form yesterday.  02/13/2017-patient reports that she had a better night.  She received MiraLAX Dulcolax and had bowel movements last night.  Daughter requested pulmonary and cardiology evaluation.  Appreciate their input.  Her two-view chest x-ray that was done today Persistent small left pleural effusion. Improved aeration of the left lung base without definite evidence of pneumonia. kub still shows a large stool burden in the colon.  Assessment & Plan:   Principal Problem:   Acute on chronic respiratory failure with hypoxia (HCC) Active Problems:   Atrial fibrillation (HCC)   Chronic diastolic heart failure (HCC)   Hypothyroidism   Dementia   SIRS (systemic inflammatory response syndrome) (HCC)   Occult blood in stools   Lactic acidosis  Viral bronchitis/COPD/chronic rhinitis/possible pneumonia with a repeat 2 view chest x-ray with no evidence of infiltrates.  She has been started on antibiotics since admission.  We will continue Rocephin for 2 more days and then stop.  Continue SVN treatments.  Appreciate speech  therapy input.  Continue Solu-Medrol 60 mg every 12 for now.  Titrate the dose as patient tolerates.  Constipation he is being addressed but patient still have a lot of stool burden will continue with MiraLAX Dulcolax and lactulose /  Chronic atrial fibrillation on Coumadin  History of chronic CHF noted cardiology increase the dose of Lasix for twice a day for just 1 day.      DVT prophylaxis: Lovenox DNR Code Status: Family Communication: Discussed with daughter Disposition Plan: To be determined  Consultants: Cardiology/pulmonary  Procedures: None Antimicrobials: Rocephin Subjective: Feels better   Objective: Vitals:   02/13/17 0646 02/13/17 0835 02/13/17 1114 02/13/17 1300  BP: (!) 177/70   (!) 177/52  Pulse: 72   69  Resp: 18   16  Temp: 97.7 F (36.5 C)   97.7 F (36.5 C)  TempSrc: Oral   Oral  SpO2: 96% 97%  96%  Weight:   72.5 kg (159 lb 12.8 oz)   Height:        Intake/Output Summary (Last 24 hours) at 02/13/2017 1631 Last data filed at 02/13/2017 0600 Gross per 24 hour  Intake 170 ml  Output -  Net 170 ml   Filed Weights   02/07/17 2157 02/13/17 1114  Weight: 70.3 kg (155 lb 0 oz) 72.5 kg (159 lb 12.8 oz)    Examination:  General exam: Appears calm and comfortable  Respiratory system: rhonchi to auscultation. Respiratory effort normal. Cardiovascular system: S1 & S2 heard, RRR. No JVD, murmurs, rubs, gallops or clicks. No pedal edema. Gastrointestinal system: Abdomen is nondistended, soft and nontender. No organomegaly or masses felt. Normal bowel sounds heard. Central nervous system: Alert  and oriented. No focal neurological deficits. Extremities: Symmetric 5 x 5 power. Skin: No rashes, lesions or ulcers Psychiatry: Judgement and insight appear normal. Mood & affect appropriate.     Data Reviewed: I have personally reviewed following labs and imaging studies  CBC: Recent Labs  Lab 02/08/17 0419 02/09/17 0517 02/10/17 0522 02/11/17 0413  02/13/17 0442  WBC 14.7* 12.5* 8.3 8.1 7.9  HGB 12.8 12.2 12.9 13.0 13.1  HCT 37.8 37.7 39.7 40.1 39.5  MCV 89.8 90.2 89.8 89.7 88.0  PLT 199 246 228 242 643   Basic Metabolic Panel: Recent Labs  Lab 02/08/17 0419 02/09/17 0517 02/10/17 0522 02/11/17 0413 02/12/17 1559  NA 134* 137 139 138 138  K 4.0 3.9 4.0 3.9 5.0  CL 101 102 105 102 101  CO2 23 26 26 27 28   GLUCOSE 169* 170* 188* 186* 225*  BUN 30* 33* 31* 31* 36*  CREATININE 1.14* 1.03* 0.91 0.99 1.05*  CALCIUM 8.8* 9.1 8.9 8.5* 8.7*   GFR: Estimated Creatinine Clearance: 42.6 mL/min (A) (by C-G formula based on SCr of 1.05 mg/dL (H)). Liver Function Tests: No results for input(s): AST, ALT, ALKPHOS, BILITOT, PROT, ALBUMIN in the last 168 hours. No results for input(s): LIPASE, AMYLASE in the last 168 hours. No results for input(s): AMMONIA in the last 168 hours. Coagulation Profile: Recent Labs  Lab 02/09/17 0517 02/10/17 0522 02/11/17 0413 02/12/17 0424 02/13/17 0442  INR 3.82 3.91 3.52 2.99 2.08   Cardiac Enzymes: Recent Labs  Lab 02/09/17 0111  TROPONINI 0.10*   BNP (last 3 results) Recent Labs    01/23/17 1156  PROBNP 2,015*   HbA1C: No results for input(s): HGBA1C in the last 72 hours. CBG: Recent Labs  Lab 02/13/17 1214  GLUCAP 247*   Lipid Profile: No results for input(s): CHOL, HDL, LDLCALC, TRIG, CHOLHDL, LDLDIRECT in the last 72 hours. Thyroid Function Tests: No results for input(s): TSH, T4TOTAL, FREET4, T3FREE, THYROIDAB in the last 72 hours. Anemia Panel: No results for input(s): VITAMINB12, FOLATE, FERRITIN, TIBC, IRON, RETICCTPCT in the last 72 hours. Sepsis Labs: No results for input(s): PROCALCITON, LATICACIDVEN in the last 168 hours.  Recent Results (from the past 240 hour(s))  Culture, blood (Routine X 2) w Reflex to ID Panel     Status: None   Collection Time: 02/06/17  9:56 AM  Result Value Ref Range Status   Specimen Description BLOOD RIGHT HAND  Final   Special  Requests IN PEDIATRIC BOTTLE Blood Culture adequate volume  Final   Culture   Final    NO GROWTH 5 DAYS Performed at [facility], [place] [address] [place], [place] [address]    Report Status 02/11/2017 FINAL  Final  Respiratory Panel by PCR     Status: Abnormal   Collection Time: 02/06/17 10:00 AM  Result Value Ref Range Status   Adenovirus NOT DETECTED NOT DETECTED Final   Coronavirus 229E NOT DETECTED NOT DETECTED Final   Coronavirus HKU1 NOT DETECTED NOT DETECTED Final   Coronavirus NL63 NOT DETECTED NOT DETECTED Final   Coronavirus OC43 NOT DETECTED NOT DETECTED Final   Metapneumovirus NOT DETECTED NOT DETECTED Final   Rhinovirus / Enterovirus DETECTED (A) NOT DETECTED Final   Influenza A NOT DETECTED NOT DETECTED Final   Influenza B NOT DETECTED NOT DETECTED Final   Parainfluenza Virus 1 NOT DETECTED NOT DETECTED Final   Parainfluenza Virus 2 NOT DETECTED NOT DETECTED Final   Parainfluenza Virus 3 NOT DETECTED NOT DETECTED Final  Parainfluenza Virus 4 NOT DETECTED NOT DETECTED Final   Respiratory Syncytial Virus NOT DETECTED NOT DETECTED Final   Bordetella pertussis NOT DETECTED NOT DETECTED Final   Chlamydophila pneumoniae NOT DETECTED NOT DETECTED Final   Mycoplasma pneumoniae NOT DETECTED NOT DETECTED Final    Comment: Performed at [facility], [place] [address] [place], [place] [address]  MRSA PCR Screening     Status: None   Collection Time: 02/06/17 11:41 AM  Result Value Ref Range Status   MRSA by PCR NEGATIVE NEGATIVE Final    Comment:        The GeneXpert MRSA Assay (FDA approved for NASAL specimens only), is one component of a comprehensive MRSA colonization surveillance program. It is not intended to diagnose MRSA infection nor to guide or monitor treatment for MRSA infections.   Culture, blood (Routine X 2) w Reflex to ID Panel     Status: None   Collection Time: 02/06/17 11:58 AM  Result Value Ref Range Status   Specimen Description BLOOD  RIGHT ANTECUBITAL  Final   Special Requests   Final    BOTTLES DRAWN AEROBIC AND ANAEROBIC Blood Culture adequate volume   Culture   Final    NO GROWTH 5 DAYS Performed at [facility], [address], [place], [place] [address]    Report Status 02/11/2017 FINAL  Final         Radiology Studies: Dg Chest 2 View  Result Date: 02/13/2017 CLINICAL DATA:  Hypoxemia.  Pneumonia.  History of COPD. EXAM: CHEST  2 VIEW COMPARISON:  02/12/2017 FINDINGS: Sequelae of prior CABG are again identified. The cardiac silhouette is borderline enlarged. There is a persistent small left pleural effusion. Mild hazy density in the left lung base on yesterday's study has improved. The right lung remains clear. No pneumothorax is identified. Chronic thoracic and lumbar spine compression fractures are partially visualized. IMPRESSION: Persistent small left pleural effusion. Improved aeration of the left lung base without definite evidence of pneumonia. Electronically Signed   By: Logan Bores M.D.   On: 02/13/2017 13:14   Dg Abd 1 View  Result Date: 02/13/2017 CLINICAL DATA:  No bowel movement for several days. EXAM: ABDOMEN - 1 VIEW COMPARISON:  None. FINDINGS: Large amount of stool seen throughout the colon. No abnormal bowel dilatation is noted. Phleboliths are noted in the pelvis. IMPRESSION: Large stool burden is noted. Electronically Signed   By: Marijo Conception, M.D.   On: 02/13/2017 13:13   Dg Chest Port 1 View  Result Date: 02/12/2017 CLINICAL DATA:  Shortness of breath. History of COPD, atrial fibrillation, coronary artery disease and CABG, former smoker. EXAM: PORTABLE CHEST 1 VIEW COMPARISON:  Chest x-ray of February 08, 2017 FINDINGS: The lungs are hyperinflated with hemidiaphragm flattening. There is hazy increased density at the left lung base today. There is a small left pleural effusion. The right lung is well-expanded and clear. The heart is top-normal in size. The pulmonary vascularity is  normal. There is calcification in the wall of the aortic arch. The sternal wires are intact. IMPRESSION: COPD. Hazy increased density the left lung base more conspicuous today. This suggests subsegmental atelectasis or early pneumonia. There may be a trace of pleural fluid at the left lung base as well. When the patient can tolerate the procedure, a PA and lateral chest x-ray would be useful. Electronically Signed   By: David  Martinique M.D.   On: 02/12/2017 11:06  Scheduled Meds: . allopurinol  300 mg Oral Daily  . amitriptyline  20 mg Oral QHS  . amLODipine  10 mg Oral QHS  . benzonatate  100 mg Oral TID  . bisacodyl  10 mg Rectal Once  . celecoxib  200 mg Oral Daily  . citalopram  20 mg Oral BID  . digoxin  0.125 mg Oral Q M,W,F  . donepezil  10 mg Oral Daily  . fluticasone  2 spray Each Nare BID  . furosemide  40 mg Intravenous BID  . guaiFENesin  600 mg Oral BID  . insulin aspart  0-5 Units Subcutaneous QHS  . insulin aspart  0-9 Units Subcutaneous TID WC  . ipratropium  2 spray Nasal TID  . ipratropium-albuterol  3 mL Nebulization Q6H  . levothyroxine  25 mcg Oral QAC breakfast  . losartan  50 mg Oral Daily  . memantine  28 mg Oral Daily  . methylPREDNISolone (SOLU-MEDROL) injection  40 mg Intravenous Q12H  . metoprolol succinate  25 mg Oral Daily  . mometasone-formoterol  2 puff Inhalation BID  . neomycin-bacitracin-polymyxin   Topical Daily  . pantoprazole  40 mg Oral BID AC  . polyethylene glycol  17 g Oral Daily  . QUEtiapine  50 mg Oral QHS  . warfarin  5 mg Oral ONCE-1800  . Warfarin - Pharmacist Dosing Inpatient   Does not apply q1800   Continuous Infusions: . cefTRIAXone (ROCEPHIN)  IV Stopped (02/13/17 1356)     LOS: 7 days     Georgette Shell, MD [facility]  If 7PM-7AM, please contact night-coverage [email or web address] Password [email or web address] 02/13/2017, 4:31 PM

## 2017-02-13 NOTE — Progress Notes (Signed)
Physical Therapy Treatment Patient Details Name: Amber [name] MRN: [id number] DOB: [birth year] Today's Date: 02/13/2017    History of Present Illness 83 y.o. female admitted on 02/06/17 for SOB, fall at home. dx with acute respiratory distress, bronchitis with hypoxia (h/o COPD) with suspected viral infection (culture results pending). Pt also with severe sepsis, tachycardia, tachypnea, lactic acidosis.  Pt with significant PMH of memory issues, HTN, CVA with left sided weakness, fibromyalgia, dementia, CAD, COPD, chronic diastolic CHF, cervical spine stenosis, A-fib on coumadin, and CABG.      PT Comments    Pt continues to participate well with therapy. Noted more dyspnea on today compared to last session. Daughter present during session. D/c plan remains home with HHPT.   Follow Up Recommendations  Home health PT;Supervision/Assistance - 24 hour     Equipment Recommendations  None recommended by PT    Recommendations for Other Services       Precautions / Restrictions Precautions Precautions: Fall Precaution Comments: recent h/o falls Required Braces or Orthoses: Other Brace/Splint Other Brace/Splint: left leg bil upright AFO Restrictions Weight Bearing Restrictions: No    Mobility  Bed Mobility Overal bed mobility: Needs Assistance Bed Mobility: Supine to Sit;Sit to Supine     Supine to sit: Min guard;HOB elevated Sit to supine: Min guard;HOB elevated   General bed mobility comments: Increased time.   Transfers Overall transfer level: Needs assistance Equipment used: Rolling walker (2 wheeled) Transfers: Sit to/from Stand Sit to Stand: Min assist         General transfer comment: Assist to rise, stabilize. VCs hand placement.   Ambulation/Gait Ambulation/Gait assistance: Min assist Ambulation Distance (Feet): 200 Feet Assistive device: Rolling walker (2 wheeled) Gait Pattern/deviations: Step-through pattern;Decreased stride length     General Gait  Details: Intermittent assist to steady. L LE instability noted. dyspnea 2/4.    Stairs            Wheelchair Mobility    Modified Rankin (Stroke Patients Only)       Balance Overall balance assessment: Needs assistance;History of Falls           Standing balance-Leahy Scale: Poor                              Cognition Arousal/Alertness: Awake/alert Behavior During Therapy: WFL for tasks assessed/performed Overall Cognitive Status: History of cognitive impairments - at baseline                                 General Comments: wfls during session      Exercises      General Comments        Pertinent Vitals/Pain Pain Assessment: No/denies pain    Home Living                      Prior Function            PT Goals (current goals can now be found in the care plan section) Acute Rehab PT Goals Patient Stated Goal: daughter wants her to breathe better and stop falling and return home Progress towards PT goals: Progressing toward goals    Frequency    Min 3X/week      PT Plan Current plan remains appropriate    Co-evaluation              AM-PAC PT "6 Clicks"  Daily Activity  Outcome Measure  Difficulty turning over in bed (including adjusting bedclothes, sheets and blankets)?: A Little Difficulty moving from lying on back to sitting on the side of the bed? : A Little Difficulty sitting down on and standing up from a chair with arms (e.g., wheelchair, bedside commode, etc,.)?: Unable Help needed moving to and from a bed to chair (including a wheelchair)?: A Little Help needed walking in hospital room?: A Little Help needed climbing 3-5 steps with a railing? : A Little 6 Click Score: 16    End of Session Equipment Utilized During Treatment: Gait belt Activity Tolerance: Patient tolerated treatment well Patient left: in bed;with call bell/phone within reach;with family/visitor present   PT Visit  Diagnosis: Difficulty in walking, not elsewhere classified (R26.2);Muscle weakness (generalized) (M62.81);History of falling (Z91.81)     Time: 8251-8984 PT Time Calculation (min) (ACUTE ONLY): 13 min  Charges:  $Gait Training: 8-22 mins                    G Codes:          Weston Anna, MPT Pager: [phone]

## 2017-02-13 NOTE — Progress Notes (Signed)
Amber [name] for warfarin Indication: atrial fibrillation  Allergies  Allergen Reactions  . Ezetimibe Other (See Comments)    Myalgia  . Welchol [Colesevelam Hcl] Other (See Comments)    Muscle aches  . Adhesive [Tape] Itching, Rash and Other (See Comments)    Burning   . Ceclor [Cefaclor] Other (See Comments)    Unknown allergic reaction  . Elastic Bandages & [Zinc] Rash and Other (See Comments)    Turns red on the areas it touches  . Latex Itching, Rash and Other (See Comments)    Burning   . Lipitor [Atorvastatin Calcium] Other (See Comments)    Increased fibromyalgia pain  . Mevacor [Lovastatin] Other (See Comments)    Increased fibromyalgia pain  . Pravachol Other (See Comments)    Increased fibromyalgia pain  . Vasotec Other (See Comments)    Unknown allergic reaction    Patient Measurements: Height: 5\' 5"  (165.1 cm) Weight: 155 lb 0 oz (70.3 kg) IBW/kg (Calculated) : 57   Vital Signs: Temp: 97.7 F (36.5 C) (01/04 0646) Temp Source: Oral (01/04 0646) BP: 177/70 (01/04 0646) Pulse Rate: 72 (01/04 0646)  Labs: Recent Labs    02/11/17 0413 02/12/17 0424 02/12/17 1559 02/13/17 0442  HGB 13.0  --   --  13.1  HCT 40.1  --   --  39.5  PLT 242  --   --  261  LABPROT 35.0* 30.9*  --  23.2*  INR 3.52 2.99  --  2.08  CREATININE 0.99  --  1.05*  --     Estimated Creatinine Clearance: 42 mL/min (A) (by C-G formula based on SCr of 1.05 mg/dL (H)).   Assessment: 83 y.o. female past medical history significant of atrial fibrillation,COPD, chronic diastolic CHF, CAD s/p CABG, gout, mild dementia who presents from home with dyspnea and a fall.    Home dose of warfarin 5mg  daily, except 7.5 mg on Tues and Saturday  Today, 02/13/2017:  INR therapeutic but now borderline low after large drop overnight  Last warfarin dose 12/30  CBC: Hgb and plt stable wnl  DI: doxycycline stopped 1/2  Home meds celecoxib and clopidogrel  increase risk of bleeding with warfarin therapy  Goal of Therapy:  INR 2-3   Plan:   Warfarin 5 mg PO x 1 tonight. Expect increased warfarin sensitivity with multiple acute issues, but d/t large drop in INR today will give full dose in attempt to avoid subtherapeutic levels    Daily INR  F/u CBC  Reuel Boom, PharmD, BCPS Pager: [phone] 02/13/2017, 9:41 AM

## 2017-02-13 NOTE — Consult Note (Signed)
PULMONARY / CRITICAL CARE MEDICINE   Name: Amber [name] MRN: [id number] DOB: [birth year]    ADMISSION DATE:  02/06/2017 CONSULTATION DATE:  1/4  REFERRING MD:  Rodena Piety  CHIEF COMPLAINT:  Worsening respiratory failure   HISTORY OF PRESENT ILLNESS:   83 year old female with a significant history of coronary artery disease status post bypass in 2000, atrial fibrillation on Coumadin, chronic diastolic heart failure chronic kidney disease.  Also 40 pack-year history of smoking, CVA, dementia.  12/29 with complaint of cough, sore throat, runny nose chronic obstructive pulmonary disease and felt possibly complicated by decompensated heart failure.  Treated in the usual fashion with supplemental oxygen, scheduled bronchodilators and steroids.  Her respiratory viral panel was positive for rhinovirus.  She is improved to the point where she was felt she was appropriate for discharge.  However is back on 123 developed increased congestion and shortness of breath chest x-ray was asked to see showing new basilar airspace she was again placed on antibiotics.  As of 1/4 feeling some better, but family concerned given clinical decline.  Pulmonary has been asked to evaluate to make further recommendations as appropriate.  PAST MEDICAL HISTORY :  She  has a past medical history of Abnormality of gait (08/02/2014), Cerebrovascular disease (05/21/2016), Cervical spinal stenosis, Chronic atrial fibrillation (HCC), Chronic diastolic CHF (congestive heart failure) ([place]), CKD (chronic kidney disease), stage III ([place]), COPD with emphysema ([place]), Coronary artery disease, CVA (cerebral vascular accident) ([place]), Dementia, Depression, Fibromyalgia, GERD (gastroesophageal reflux disease), Gout, Hemiparesis and alteration of sensations as late effects of stroke ([place]) (01/17/2015), History of melanoma, Hyperlipidemia, Hypertension, Hypertensive heart disease, Hypothyroidism, Insomnia, Intermittent confusion, Memory change  (01/23/2014), Nasal polyposis, Osteoarthritis, Osteoporosis, Pneumonia, and Tobacco abuse.  PAST SURGICAL HISTORY: She  has a past surgical history that includes Abdominal hysterectomy; Breast lumpectomy (1980s); carpel tunnel; Tonsillectomy; Eye surgery; Coronary artery bypass graft (2000); Radiology with anesthesia (12/24/2011); Radiology with anesthesia (N/A, 07/19/2014); Radiology with anesthesia (N/A, 07/27/2014); and ir generic historical (03/31/2016).  Allergies  Allergen Reactions  . Ezetimibe Other (See Comments)    Myalgia  . Welchol [Colesevelam Hcl] Other (See Comments)    Muscle aches  . Adhesive [Tape] Itching, Rash and Other (See Comments)    Burning   . Ceclor [Cefaclor] Other (See Comments)    Unknown allergic reaction  . Elastic Bandages & [Zinc] Rash and Other (See Comments)    Turns red on the areas it touches  . Latex Itching, Rash and Other (See Comments)    Burning   . Lipitor [Atorvastatin Calcium] Other (See Comments)    Increased fibromyalgia pain  . Mevacor [Lovastatin] Other (See Comments)    Increased fibromyalgia pain  . Pravachol Other (See Comments)    Increased fibromyalgia pain  . Vasotec Other (See Comments)    Unknown allergic reaction    No current facility-administered medications on file prior to encounter.    Current Outpatient Medications on File Prior to Encounter  Medication Sig  . albuterol (PROVENTIL HFA;VENTOLIN HFA) 108 (90 Base) MCG/ACT inhaler Inhale 2 puffs into the lungs every 4 (four) hours as needed for wheezing or shortness of breath.  . allopurinol (ZYLOPRIM) 300 MG tablet Take 1 tablet (300 mg total) by mouth every other day. (Patient taking differently: Take 300 mg by mouth daily. )  . amitriptyline (ELAVIL) 10 MG tablet TAKE 2 TABLETS BY MOUTH AT BEDTIME (Patient taking differently: TAKE 20mg   BY MOUTH AT BEDTIME)  . amLODipine (NORVASC) 5 MG tablet Take 1 tablet (  5 mg total) by mouth at bedtime.  . celecoxib (CELEBREX) 200 MG  capsule Take 200 mg by mouth every morning.   . citalopram (CELEXA) 20 MG tablet TAKE 1 TABLET BY MOUTH TWICE A DAY  . clopidogrel (PLAVIX) 75 MG tablet TAKE 1 TABLET (75 MG TOTAL) BY MOUTH DAILY.  [name] COUMADIN 5 MG tablet TAKE AS DIRECTED BY COUMADIN CLINIC. Hold coumadin November 24 th and November 25 th.  Recheck INR on Monday. (Patient taking differently: Take 5-7.5 mg by mouth daily. Take 5 mg everyday except 7.5 mg on Tuesday and Saturday)  . digoxin (LANOXIN) 0.125 MG tablet Take 1 tablet (0.125 mg total) by mouth on Mondays, Wednesdays, & Fridays  . donepezil (ARICEPT) 10 MG tablet Take 1 tablet (10 mg total) by mouth at bedtime. (Patient taking differently: Take 10 mg by mouth daily. )  . ergocalciferol (VITAMIN D2) 50000 UNITS capsule Take 50,000 Units by mouth 2 (two) times a week. Tuesday and Friday  . furosemide (LASIX) 40 MG tablet Take 1 tablet (40 mg total) by mouth daily.  [name] KLOR-CON M20 20 MEQ tablet Take 20 mEq by mouth daily.   [name] levothyroxine (SYNTHROID, LEVOTHROID) 25 MCG tablet Take 25 mcg by mouth every morning.   [name] losartan (COZAAR) 50 MG tablet Take 1 tablet (50 mg total) by mouth daily.  . memantine (NAMENDA XR) 28 MG CP24 24 hr capsule TAKE ONE CAPSULE BY MOUTH EVERY DAY  . metoprolol succinate (TOPROL-XL) 25 MG 24 hr tablet TAKE 1 TABLET (25 MG TOTAL) BY MOUTH DAILY. (Patient taking differently: TAKE 1 TABLET (25 MG TOTAL) BY MOUTH DAILY. Morning)  . nitroGLYCERIN (NITROSTAT) 0.4 MG SL tablet Place 1 tablet (0.4 mg total) under the tongue every 5 (five) minutes as needed for chest pain.  [name] PROAIR HFA 108 (90 BASE) MCG/ACT inhaler INHALE 2 PUFFS INTO LUNGS EVERY 6 HOURS AS NEEDED FOR WHEEZING (Patient not taking: Reported on 02/06/2017)    FAMILY HISTORY:  Her indicated that her mother is deceased. She indicated that her father is deceased. She indicated that her brother is deceased. She indicated that her maternal grandmother is deceased. She indicated that her maternal  grandfather is deceased. She indicated that her paternal grandmother is deceased. She indicated that her paternal grandfather is deceased.   SOCIAL HISTORY: She  reports that she quit smoking about 40 years ago. Her smoking use included cigarettes. She has a 37.50 pack-year smoking history. she has never used smokeless tobacco. She reports that she does not drink alcohol or use drugs.  REVIEW OF SYSTEMS:   General: No fever, headache, dizziness.  HEENT Marked nasal discharge this is chronic with clear discharge no sore throat or difficulty swallowing Pulmonary: Rhonchorous cough, clear mucus, no chest pain cardiac, no palpitation or pain abdomen, no pain has had trouble with bowel movements GU, no issues neuro, short-term memory problems  SUBJECTIVE:  No distress  VITAL SIGNS: BP (!) 177/70 (BP Location: Right Arm)   Pulse 72   Temp 97.7 F (36.5 C) (Oral)   Resp 18   Ht 5\' 5"  (1.651 m)   Wt 159 lb 12.8 oz (72.5 kg)   SpO2 97%   BMI 26.59 kg/m   HEMODYNAMICS:    VENTILATOR SETTINGS:    INTAKE / OUTPUT: I/O last 3 completed shifts: In: 170 [P.O.:120; IV Piggyback:50] Out: 900 [Urine:900]  PHYSICAL EXAMINATION: General: Frail 83 year old female resting comfortably in bed Neuro: Awake oriented no focal deficits does have short-term memory deficits HEENT:  Normocephalic atraumatic mucous membranes are moist very coarse rhonchus upper airway sounds Cardiovascular: Regular rate and rhythm without murmur rub or gallop Lungs: Scattered rhonchi no accessory use Abdomen: Soft nontender positive bowel sounds Musculoskeletal: Equal strength and bulk Skin: Warm and dry  LABS:  BMET Recent Labs  Lab 02/10/17 0522 02/11/17 0413 02/12/17 1559  NA 139 138 138  K 4.0 3.9 5.0  CL 105 102 101  CO2 26 27 28   BUN 31* 31* 36*  CREATININE 0.91 0.99 1.05*  GLUCOSE 188* 186* 225*    Electrolytes Recent Labs  Lab 02/10/17 0522 02/11/17 0413 02/12/17 1559  CALCIUM 8.9 8.5*  8.7*    CBC Recent Labs  Lab 02/10/17 0522 02/11/17 0413 02/13/17 0442  WBC 8.3 8.1 7.9  HGB 12.9 13.0 13.1  HCT 39.7 40.1 39.5  PLT 228 242 261    Coag's Recent Labs  Lab 02/11/17 0413 02/12/17 0424 02/13/17 0442  INR 3.52 2.99 2.08    Sepsis Markers No results for input(s): LATICACIDVEN, PROCALCITON, O2SATVEN in the last 168 hours.  ABG No results for input(s): PHART, PCO2ART, PO2ART in the last 168 hours.  Liver Enzymes No results for input(s): AST, ALT, ALKPHOS, BILITOT, ALBUMIN in the last 168 hours.  Cardiac Enzymes Recent Labs  Lab 02/09/17 0111  TROPONINI 0.10*    Glucose Recent Labs  Lab 02/13/17 1214  GLUCAP 247*    Imaging Dg Abd 1 View  Result Date: 02/13/2017 CLINICAL DATA:  No bowel movement for several days. EXAM: ABDOMEN - 1 VIEW COMPARISON:  None. FINDINGS: Large amount of stool seen throughout the colon. No abnormal bowel dilatation is noted. Phleboliths are noted in the pelvis. IMPRESSION: Large stool burden is noted. Electronically Signed   By: Marijo Conception, M.D.   On: 02/13/2017 13:13     STUDIES:    CULTURES: Rhinovirus 03-04-2023  ANTIBIOTICS: Rocephin 1/3  SIGNIFICANT EVENTS:      ASSESSMENT / PLAN  Acute viral bronchitis (rhinovirus) Small left effusion  Chronic rhino sinusitis Acute on chronic cough laryngopharyngeal reflux disease.   Presumptive COPD w/ Acute exacerbation.  Diastolic HF c/b mild AS/MS Chronic af CKD stage III  Discussion Ongoing dyspnea and cough.  I suspect this is somewhat acute on chronic.  She has long-standing chronic rhinosinusitis, with what family describes as almost constant rhinitis.  It seems as though most of her symptom burden is originating from the upper airway most consistent with laryngal pharyngeal reflux disease.  Suspect her chronic rhinosinusitis superimposed on recent rhinovirus infection and additionally concern for occult reflux disease would be the most likely  contributing factors at this point.  I am not convinced that she is volume overloaded and her chest x-ray is not compelling for a pneumonia.  Plan We will add nasal steroid as well as nasal Atrovent to cover for both allergic and nonallergic rhinitis, her chronic nasal discharge certainly would contribute to her ongoing upper airway symptoms Reasonable to continue ceftriaxone for secondary bacterial infection although not convinced she is actually infected, will check pro-calcitonin Continue bronchodilators Continue systemic steroids at lowest effective dosing Reflux precautions, would ensure she sits upright for at least 1 hour post p.o. intake I do think she needs both swallow evaluation as well as consider a looking for occult reflux as potential contributing factors as well agree with speech therapy that constipation issue needs to be addressed first   Erick Colace ACNP-BC [place] Pager # [phone] OR # [phone] if no answer  02/13/2017, 1:15 PM

## 2017-02-14 LAB — BASIC METABOLIC PANEL
Anion gap: 9 (ref 5–15)
BUN: 38 mg/dL — AB (ref 6–20)
CALCIUM: 8.5 mg/dL — AB (ref 8.9–10.3)
CO2: 31 mmol/L (ref 22–32)
CREATININE: 0.98 mg/dL (ref 0.44–1.00)
Chloride: 97 mmol/L — ABNORMAL LOW (ref 101–111)
GFR, EST NON AFRICAN AMERICAN: 53 mL/min — AB (ref >60)
Glucose, Bld: 180 mg/dL — ABNORMAL HIGH (ref 65–99)
Potassium: 4.1 mmol/L (ref 3.5–5.1)
SODIUM: 137 mmol/L (ref 135–145)

## 2017-02-14 LAB — BRAIN NATRIURETIC PEPTIDE: B NATRIURETIC PEPTIDE 5: 190.5 pg/mL — AB (ref 0.0–100.0)

## 2017-02-14 LAB — PROCALCITONIN

## 2017-02-14 LAB — GLUCOSE, CAPILLARY
GLUCOSE-CAPILLARY: 187 mg/dL — AB (ref 65–99)
Glucose-Capillary: 137 mg/dL — ABNORMAL HIGH (ref 65–99)
Glucose-Capillary: 285 mg/dL — ABNORMAL HIGH (ref 65–99)
Glucose-Capillary: 155 mg/dL — ABNORMAL HIGH (ref 65–99)

## 2017-02-14 LAB — STREP PNEUMONIAE URINARY ANTIGEN: Strep Pneumo Urinary Antigen: NEGATIVE

## 2017-02-14 LAB — PROTIME-INR
INR: 1.7
PROTHROMBIN TIME: 19.8 s — AB (ref 11.4–15.2)

## 2017-02-14 LAB — DIGOXIN LEVEL: DIGOXIN LVL: 0.6 ng/mL — AB (ref 0.8–2.0)

## 2017-02-14 MED ORDER — WARFARIN SODIUM 5 MG PO TABS
5.0000 mg | ORAL_TABLET | Freq: Once | ORAL | Status: AC
  Administered 2017-02-14: 5 mg via ORAL
  Filled 2017-02-14: qty 1

## 2017-02-14 NOTE — Progress Notes (Signed)
PROGRESS NOTE    Amber [name]  ACZ:[id number] DOB: [birth year] DOA: 02/06/2017 PCP: Antony Contras, MD  Brief Narrative:82 y.o.femalepast medical history significant of atrial fibrillation,COPD (although patient and family does not recall this diagnosis being made), chronic diastolic CHF, CAD s/p CABG, gout, mild dementia who presents from home with dyspnea and a fall.Per her daughter, the patient has had a runny nose, sore throat and a cough for about 2 days. Her grandchildren were sick with the same illness a few days ago. She has become steadily short of breath and this morning was breathing with rapid shallow respirations. She was admitted to stepdown unit for respiratory failure, COPD exacerbation/bronchitis  02/12/2017-patient and daughter reports that she had a bad night.she was doing well yesterday.she satrted wheezing heavily with sob.her meds were changed to po form yesterday.  02/13/2017-patient reports that she had a better night.  She received MiraLAX Dulcolax and had bowel movements last night.  Daughter requested pulmonary and cardiology evaluation.  Appreciate their input.  Her two-view chest x-ray that was done today Persistent small left pleural effusion. Improved aeration of the left lung base without definite evidence of pneumonia. kub still shows a large stool burden in the colon.  02/14/2017-daughter reports patient had a better night last night.  However not completely back to baseline she did have a couple of bowel movements.  Assessment & Plan:   Principal Problem:   Acute on chronic respiratory failure with hypoxia (HCC) Active Problems:   Atrial fibrillation (HCC)   Chronic diastolic heart failure (HCC)   Hypothyroidism   COPD exacerbation (HCC)   Dementia   SIRS (systemic inflammatory response syndrome) (HCC)   Occult blood in stools   Lactic acidosis  Viral bronchitis/COPD/chronic rhinitis/possible pneumonia with a repeat 2 view chest x-ray with no  evidence of infiltrates.  She has been started on antibiotics since admission.  We will continue Rocephin for 1 more day and then stop.  Continue SVN treatments.  Appreciate speech therapy input.  Continue Solu-Medrol 60 mg every 12 for now.  Titrate the dose as patient tolerates.?if mucomyst will help.defer to pulm..  Constipation he is being addressed but patient still have a lot of stool burden will continue with MiraLAX Dulcolax and lactulose   Chronic atrial fibrillation on Coumadin  History of chronic CHF noted cardiology increase the dose of Lasix for twice a day for just 1 day.      DVT prophylaxis: Lovenox  Code Status: DNR Family Communication: Discussed in detail with the daughter Disposition Plan: TBD Consultants:  Cardiology/pulmonary Procedures: None Antimicrobials:  Rocephin Subjective: Family reports she has a good appetite she does not appear to be in any distress  Objective: Patient sitting up in no acute distress eating breakfast Vitals:   02/14/17 0623 02/14/17 0747 02/14/17 1215 02/14/17 1349  BP: (!) 153/65   140/75  Pulse: 67   72  Resp: 18   18  Temp: 98 F (36.7 C)   98.2 F (36.8 C)  TempSrc: Oral   Oral  SpO2: 94% 96% 94% 96%  Weight:      Height:        Intake/Output Summary (Last 24 hours) at 02/14/2017 1419 Last data filed at 02/14/2017 0900 Gross per 24 hour  Intake 310 ml  Output 3 ml  Net 307 ml   Filed Weights   02/07/17 2157 02/13/17 1114  Weight: 70.3 kg (155 lb 0 oz) 72.5 kg (159 lb 12.8 oz)    Examination:  General  exam: Appears calm and comfortable  Respiratory system: wheezing auscultation. Respiratory effort normal. Cardiovascular system: S1 & S2 heard, RRR. No JVD, murmurs, rubs, gallops or clicks. No pedal edema. Gastrointestinal system: Abdomen is nondistended, soft and nontender. No organomegaly or masses felt. Normal bowel sounds heard. Central nervous system: Alert and oriented. No focal neurological  deficits. Extremities: Symmetric 5 x 5 power. Skin: No rashes, lesions or ulcers     Data Reviewed: I have personally reviewed following labs and imaging studies  CBC: Recent Labs  Lab 02/08/17 0419 02/09/17 0517 02/10/17 0522 02/11/17 0413 02/13/17 0442  WBC 14.7* 12.5* 8.3 8.1 7.9  HGB 12.8 12.2 12.9 13.0 13.1  HCT 37.8 37.7 39.7 40.1 39.5  MCV 89.8 90.2 89.8 89.7 88.0  PLT 199 246 228 242 412   Basic Metabolic Panel: Recent Labs  Lab 02/09/17 0517 02/10/17 0522 02/11/17 0413 02/12/17 1559 02/14/17 0408  NA 137 139 138 138 137  K 3.9 4.0 3.9 5.0 4.1  CL 102 105 102 101 97*  CO2 26 26 27 28 31   GLUCOSE 170* 188* 186* 225* 180*  BUN 33* 31* 31* 36* 38*  CREATININE 1.03* 0.91 0.99 1.05* 0.98  CALCIUM 9.1 8.9 8.5* 8.7* 8.5*   GFR: Estimated Creatinine Clearance: 45.7 mL/min (by C-G formula based on SCr of 0.98 mg/dL). Liver Function Tests: No results for input(s): AST, ALT, ALKPHOS, BILITOT, PROT, ALBUMIN in the last 168 hours. No results for input(s): LIPASE, AMYLASE in the last 168 hours. No results for input(s): AMMONIA in the last 168 hours. Coagulation Profile: Recent Labs  Lab 02/10/17 0522 02/11/17 0413 02/12/17 0424 02/13/17 0442 02/14/17 0408  INR 3.91 3.52 2.99 2.08 1.70   Cardiac Enzymes: Recent Labs  Lab 02/09/17 0111  TROPONINI 0.10*   BNP (last 3 results) Recent Labs    01/23/17 1156  PROBNP 2,015*   HbA1C: No results for input(s): HGBA1C in the last 72 hours. CBG: Recent Labs  Lab 02/13/17 1214 02/13/17 1706 02/13/17 2157 02/14/17 0754 02/14/17 1223  GLUCAP 247* 152* 190* 137* 285*   Lipid Profile: No results for input(s): CHOL, HDL, LDLCALC, TRIG, CHOLHDL, LDLDIRECT in the last 72 hours. Thyroid Function Tests: No results for input(s): TSH, T4TOTAL, FREET4, T3FREE, THYROIDAB in the last 72 hours. Anemia Panel: No results for input(s): VITAMINB12, FOLATE, FERRITIN, TIBC, IRON, RETICCTPCT in the last 72 hours. Sepsis  Labs: Recent Labs  Lab 02/14/17 0408  PROCALCITON <0.10    Recent Results (from the past 240 hour(s))  Culture, blood (Routine X 2) w Reflex to ID Panel     Status: None   Collection Time: 02/06/17  9:56 AM  Result Value Ref Range Status   Specimen Description BLOOD RIGHT HAND  Final   Special Requests IN PEDIATRIC BOTTLE Blood Culture adequate volume  Final   Culture   Final    NO GROWTH 5 DAYS Performed at [facility], [place] [address], [place],  [address]    Report Status 02/11/2017 FINAL  Final  Respiratory Panel by PCR     Status: Abnormal   Collection Time: 02/06/17 10:00 AM  Result Value Ref Range Status   Adenovirus NOT DETECTED NOT DETECTED Final   Coronavirus 229E NOT DETECTED NOT DETECTED Final   Coronavirus HKU1 NOT DETECTED NOT DETECTED Final   Coronavirus NL63 NOT DETECTED NOT DETECTED Final   Coronavirus OC43 NOT DETECTED NOT DETECTED Final   Metapneumovirus NOT DETECTED NOT DETECTED Final   Rhinovirus / Enterovirus DETECTED (A)  NOT DETECTED Final   Influenza A NOT DETECTED NOT DETECTED Final   Influenza B NOT DETECTED NOT DETECTED Final   Parainfluenza Virus 1 NOT DETECTED NOT DETECTED Final   Parainfluenza Virus 2 NOT DETECTED NOT DETECTED Final   Parainfluenza Virus 3 NOT DETECTED NOT DETECTED Final   Parainfluenza Virus 4 NOT DETECTED NOT DETECTED Final   Respiratory Syncytial Virus NOT DETECTED NOT DETECTED Final   Bordetella pertussis NOT DETECTED NOT DETECTED Final   Chlamydophila pneumoniae NOT DETECTED NOT DETECTED Final   Mycoplasma pneumoniae NOT DETECTED NOT DETECTED Final    Comment: Performed at [facility], [place] [address], [place], [place] [address]  MRSA PCR Screening     Status: None   Collection Time: 02/06/17 11:41 AM  Result Value Ref Range Status   MRSA by PCR NEGATIVE NEGATIVE Final    Comment:        The GeneXpert MRSA Assay (FDA approved for NASAL specimens only), is one component of a comprehensive MRSA  colonization surveillance program. It is not intended to diagnose MRSA infection nor to guide or monitor treatment for MRSA infections.   Culture, blood (Routine X 2) w Reflex to ID Panel     Status: None   Collection Time: 02/06/17 11:58 AM  Result Value Ref Range Status   Specimen Description BLOOD RIGHT ANTECUBITAL  Final   Special Requests   Final    BOTTLES DRAWN AEROBIC AND ANAEROBIC Blood Culture adequate volume   Culture   Final    NO GROWTH 5 DAYS Performed at [facility], [address] [place], [place] [address]    Report Status 02/11/2017 FINAL  Final         Radiology Studies: Dg Chest 2 View  Result Date: 02/13/2017 CLINICAL DATA:  Hypoxemia.  Pneumonia.  History of COPD. EXAM: CHEST  2 VIEW COMPARISON:  02/12/2017 FINDINGS: Sequelae of prior CABG are again identified. The cardiac silhouette is borderline enlarged. There is a persistent small left pleural effusion. Mild hazy density in the left lung base on yesterday's study has improved. The right lung remains clear. No pneumothorax is identified. Chronic thoracic and lumbar spine compression fractures are partially visualized. IMPRESSION: Persistent small left pleural effusion. Improved aeration of the left lung base without definite evidence of pneumonia. Electronically Signed   By: Logan Bores M.D.   On: 02/13/2017 13:14   Dg Abd 1 View  Result Date: 02/13/2017 CLINICAL DATA:  No bowel movement for several days. EXAM: ABDOMEN - 1 VIEW COMPARISON:  None. FINDINGS: Large amount of stool seen throughout the colon. No abnormal bowel dilatation is noted. Phleboliths are noted in the pelvis. IMPRESSION: Large stool burden is noted. Electronically Signed   By: Marijo Conception, M.D.   On: 02/13/2017 13:13        Scheduled Meds: . allopurinol  300 mg Oral Daily  . amitriptyline  20 mg Oral QHS  . amLODipine  10 mg Oral QHS  . benzonatate  100 mg Oral TID  . bisacodyl  10 mg Oral Daily  . bisacodyl  10 mg  Rectal Once  . bisacodyl  10 mg Rectal Once  . citalopram  20 mg Oral BID  . digoxin  0.125 mg Oral Q M,W,F  . donepezil  10 mg Oral Daily  . fluticasone  2 spray Each Nare BID  . guaiFENesin  600 mg Oral BID  . insulin aspart  0-5 Units Subcutaneous QHS  . insulin aspart  0-9 Units Subcutaneous TID WC  . ipratropium  2 spray Nasal TID  . ipratropium-albuterol  3 mL Nebulization Q4H  . lactulose  30 g Oral NOW  . levothyroxine  25 mcg Oral QAC breakfast  . losartan  50 mg Oral Daily  . memantine  28 mg Oral Daily  . methylPREDNISolone (SOLU-MEDROL) injection  40 mg Intravenous Q12H  . metoprolol succinate  25 mg Oral Daily  . neomycin-bacitracin-polymyxin   Topical Daily  . pantoprazole  40 mg Oral BID AC  . polyethylene glycol  17 g Oral Daily  . QUEtiapine  50 mg Oral QHS  . warfarin  5 mg Oral ONCE-1800  . Warfarin - Pharmacist Dosing Inpatient   Does not apply q1800   Continuous Infusions: . cefTRIAXone (ROCEPHIN)  IV Stopped (02/14/17 1230)     LOS: 8 days       Georgette Shell, MD [facility] If 7PM-7AM, please contact night-coverage [email or web address] Password [email or web address] 02/14/2017, 2:19 PM

## 2017-02-14 NOTE — Progress Notes (Signed)
Alpha for warfarin Indication: atrial fibrillation  Allergies  Allergen Reactions  . Ezetimibe Other (See Comments)    Myalgia  . Welchol [Colesevelam Hcl] Other (See Comments)    Muscle aches  . Adhesive [Tape] Itching, Rash and Other (See Comments)    Burning   . Ceclor [Cefaclor] Other (See Comments)    Unknown allergic reaction  . Elastic Bandages & [Zinc] Rash and Other (See Comments)    Turns red on the areas it touches  . Latex Itching, Rash and Other (See Comments)    Burning   . Lipitor [Atorvastatin Calcium] Other (See Comments)    Increased fibromyalgia pain  . Mevacor [Lovastatin] Other (See Comments)    Increased fibromyalgia pain  . Pravachol Other (See Comments)    Increased fibromyalgia pain  . Vasotec Other (See Comments)    Unknown allergic reaction    Patient Measurements: Height: 5\' 5"  (165.1 cm) Weight: 159 lb 12.8 oz (72.5 kg) IBW/kg (Calculated) : 57   Vital Signs: Temp: 98 F (36.7 C) (01/05 0623) Temp Source: Oral (01/05 0623) BP: 153/65 (01/05 0623) Pulse Rate: 67 (01/05 0623)  Labs: Recent Labs    02/12/17 0424 02/12/17 1559 02/13/17 0442 02/14/17 0408  HGB  --   --  13.1  --   HCT  --   --  39.5  --   PLT  --   --  261  --   LABPROT 30.9*  --  23.2* 19.8*  INR 2.99  --  2.08 1.70  CREATININE  --  1.05*  --  0.98    Estimated Creatinine Clearance: 45.7 mL/min (by C-G formula based on SCr of 0.98 mg/dL).   Assessment: 83 y.o. female past medical history significant of atrial fibrillation,COPD, chronic diastolic CHF, CAD s/p CABG, gout, mild dementia who presents from home with dyspnea and a fall.    Home dose of warfarin 5mg  daily, except 7.5 mg on Tues and Saturday  Today, 02/14/2017:  INR now subtherapeutic after holding x 2 days and resuming reduced dose  CBC: Hgb and plt stable wnl as of 1/4  DI: doxycycline stopped 1/2  Home meds celecoxib and clopidogrel increase risk of  bleeding with warfarin therapy  Goal of Therapy:  INR 2-3   Plan:   Repeat warfarin 5 mg PO x 1 tonight. INR seems particularly labile in this patient. Would avoid doses > 5 mg until INR stable  Daily INR  F/u CBC  Reuel Boom, PharmD, BCPS Pager: [phone] 02/14/2017, 11:50 AM

## 2017-02-14 NOTE — Progress Notes (Signed)
Progress Note  Patient Name: Amber [name] Date of Encounter: 02/14/2017  Primary Cardiologist: [facility]  Primary Electrophysiologist:    Patient Profile   * Amber [name] is a 83 y.o. female with a hx of CAD s/p CABG 2000, chronic atrial fib on Coumadin, chronic diastolic CHF, CKD stage III, hyperlipidemia (statin intolerant, not previously candidate for PCSK9 due to chronically elevated CK levels), suspected COPD, 40 pyr tobacco abuse, HTN with hypertensive heart disease, cerebrovascular disease, dementia, CVA with left sided weakness, fibromyalgia, hypothyroidism, osteoporosis admitted with a fall and acute/chronic respiratory failure+ rhino virus with initial improvement and then worsening  and  being seen  for the evaluation of CHF  Cardiac impression was not clearly heart failure component.   Last stress test in 2015 was abnormal but managed medically - medium size of moderate ischemia affecting inferior and inferolateral wall, possible mild scar in inferior wall Echo 12/18 EF 65 mild PASP 52    Subjective   Still quite short of breath and coughing.  No significant improvement in dyspnea not withstanding good diuresis.  It is noteworthy, her BNP was 4-5 times normal (using a different assay) about 2 weeks ago compared to only less than twice.    Inpatient Medications    Scheduled Meds: . allopurinol  300 mg Oral Daily  . amitriptyline  20 mg Oral QHS  . amLODipine  10 mg Oral QHS  . benzonatate  100 mg Oral TID  . bisacodyl  10 mg Oral Daily  . bisacodyl  10 mg Rectal Once  . bisacodyl  10 mg Rectal Once  . citalopram  20 mg Oral BID  . digoxin  0.125 mg Oral Q M,W,F  . donepezil  10 mg Oral Daily  . fluticasone  2 spray Each Nare BID  . guaiFENesin  600 mg Oral BID  . insulin aspart  0-5 Units Subcutaneous QHS  . insulin aspart  0-9 Units Subcutaneous TID WC  . ipratropium  2 spray Nasal TID  . ipratropium-albuterol  3 mL Nebulization Q4H  . lactulose  30 g Oral NOW    . levothyroxine  25 mcg Oral QAC breakfast  . losartan  50 mg Oral Daily  . memantine  28 mg Oral Daily  . methylPREDNISolone (SOLU-MEDROL) injection  40 mg Intravenous Q12H  . metoprolol succinate  25 mg Oral Daily  . neomycin-bacitracin-polymyxin   Topical Daily  . pantoprazole  40 mg Oral BID AC  . polyethylene glycol  17 g Oral Daily  . QUEtiapine  50 mg Oral QHS  . Warfarin - Pharmacist Dosing Inpatient   Does not apply q1800   Continuous Infusions: . cefTRIAXone (ROCEPHIN)  IV Stopped (02/13/17 1356)   PRN Meds: acetaminophen **OR** acetaminophen, haloperidol lactate, ondansetron **OR** ondansetron (ZOFRAN) IV   Vital Signs    Vitals:   02/13/17 2329 02/14/17 0332 02/14/17 0623 02/14/17 0747  BP:   (!) 153/65   Pulse:   67   Resp:   18   Temp:   98 F (36.7 C)   TempSrc:   Oral   SpO2: 94% 95% 94% 96%  Weight:      Height:        Intake/Output Summary (Last 24 hours) at 02/14/2017 1010 Last data filed at 02/14/2017 0600 Gross per 24 hour  Intake 70 ml  Output 4 ml  Net 66 ml    I/o are incomplete.  2400 cc out in the last 48 hours  [employer]  02/07/17 2157 02/13/17 1114  Weight: 155 lb 0 oz (70.3 kg) 159 lb 12.8 oz (72.5 kg)    Telemetry    afIB CONTROLLED ventricular re - Personally Reviewed  ECG       Physical Exam    GEN: mild distress.   Neck: JVD unable to detect  Cardiac:  Irregular rate and rhythm Respiratory:  Rhonchus and diminished breath sounds GI: Soft, nontender, non-distended  MS: No edema; No deformity. Neuro:  Nonfocal  Psych: Normal affect  Skin Warm and dry   Labs    Chemistry Recent Labs  Lab 02/11/17 0413 02/12/17 1559 02/14/17 0408  NA 138 138 137  K 3.9 5.0 4.1  CL 102 101 97*  CO2 27 28 31   GLUCOSE 186* 225* 180*  BUN 31* 36* 38*  CREATININE 0.99 1.05* 0.98  CALCIUM 8.5* 8.7* 8.5*  GFRNONAA 52* 49* 53*  GFRAA >60 57* >60  ANIONGAP 9 9 9      Hematology Recent Labs  Lab 02/10/17 0522 02/11/17 0413  02/13/17 0442  WBC 8.3 8.1 7.9  RBC 4.42 4.47 4.49  HGB 12.9 13.0 13.1  HCT 39.7 40.1 39.5  MCV 89.8 89.7 88.0  MCH 29.2 29.1 29.2  MCHC 32.5 32.4 33.2  RDW 15.6* 15.6* 15.3  PLT 228 242 261    Cardiac Enzymes Recent Labs  Lab 02/09/17 0111  TROPONINI 0.10*   No results for input(s): TROPIPOC in the last 168 hours.   BNP Recent Labs  Lab 02/14/17 0408  BNP 190.5*<<245     DDimer No results for input(s): DDIMER in the last 168 hours.   Radiology    Dg Chest 2 View  Result Date: 02/13/2017 CLINICAL DATA:  Hypoxemia.  Pneumonia.  History of COPD. EXAM: CHEST  2 VIEW COMPARISON:  02/12/2017 FINDINGS: Sequelae of prior CABG are again identified. The cardiac silhouette is borderline enlarged. There is a persistent small left pleural effusion. Mild hazy density in the left lung base on yesterday's study has improved. The right lung remains clear. No pneumothorax is identified. Chronic thoracic and lumbar spine compression fractures are partially visualized. IMPRESSION: Persistent small left pleural effusion. Improved aeration of the left lung base without definite evidence of pneumonia. Electronically Signed   By: Logan Bores M.D.   On: 02/13/2017 13:14   Dg Abd 1 View  Result Date: 02/13/2017 CLINICAL DATA:  No bowel movement for several days. EXAM: ABDOMEN - 1 VIEW COMPARISON:  None. FINDINGS: Large amount of stool seen throughout the colon. No abnormal bowel dilatation is noted. Phleboliths are noted in the pelvis. IMPRESSION: Large stool burden is noted. Electronically Signed   By: Marijo Conception, M.D.   On: 02/13/2017 13:13   Dg Chest Port 1 View  Result Date: 02/12/2017 CLINICAL DATA:  Shortness of breath. History of COPD, atrial fibrillation, coronary artery disease and CABG, former smoker. EXAM: PORTABLE CHEST 1 VIEW COMPARISON:  Chest x-ray of February 08, 2017 FINDINGS: The lungs are hyperinflated with hemidiaphragm flattening. There is hazy increased density at the left  lung base today. There is a small left pleural effusion. The right lung is well-expanded and clear. The heart is top-normal in size. The pulmonary vascularity is normal. There is calcification in the wall of the aortic arch. The sternal wires are intact. IMPRESSION: COPD. Hazy increased density the left lung base more conspicuous today. This suggests subsegmental atelectasis or early pneumonia. There may be a trace of pleural fluid at the left lung base as well.  When the patient can tolerate the procedure, a PA and lateral chest x-ray would be useful. Electronically Signed   By: David  Martinique M.D.   On: 02/12/2017 11:06    Cardiac Studies  12/18 echocardiogram normal LV function moderate TR  Device Interrogation       Assessment & Plan  Acute on chronic respiratory failure with viral pneumonia/bronchitis possible superinfection  Coronary artery disease with prior bypass surgery  Atrial fibrillation-permanent  Question concomitant heart failure  I think it is unlikely that heart failure is a major driver here.  BNP is wall mildly elevated will considerably less elevated than a few weeks ago.  There is no symptomatic improvement with the diuresis.  Maintaining low-dose background diuretics is reasonable.  No evidence of ischemia.  We will see again on Monday.   Signed, Virl Axe, MD  02/14/2017, 10:10 AM

## 2017-02-15 ENCOUNTER — Inpatient Hospital Stay (HOSPITAL_COMMUNITY): Payer: Medicare Other

## 2017-02-15 LAB — BASIC METABOLIC PANEL
ANION GAP: 9 (ref 5–15)
BUN: 43 mg/dL — ABNORMAL HIGH (ref 6–20)
CHLORIDE: 96 mmol/L — AB (ref 101–111)
CO2: 32 mmol/L (ref 22–32)
Calcium: 8.9 mg/dL (ref 8.9–10.3)
Creatinine, Ser: 1.24 mg/dL — ABNORMAL HIGH (ref 0.44–1.00)
GFR calc non Af Amer: 40 mL/min — ABNORMAL LOW (ref >60)
GFR, EST AFRICAN AMERICAN: 46 mL/min — AB (ref >60)
Glucose, Bld: 195 mg/dL — ABNORMAL HIGH (ref 65–99)
POTASSIUM: 4.8 mmol/L (ref 3.5–5.1)
SODIUM: 137 mmol/L (ref 135–145)

## 2017-02-15 LAB — CBC
HCT: 45.9 % (ref 36.0–46.0)
HEMOGLOBIN: 15.1 g/dL — AB (ref 12.0–15.0)
MCH: 29.2 pg (ref 26.0–34.0)
MCHC: 32.9 g/dL (ref 30.0–36.0)
MCV: 88.8 fL (ref 78.0–100.0)
PLATELETS: 295 10*3/uL (ref 150–400)
RBC: 5.17 MIL/uL — ABNORMAL HIGH (ref 3.87–5.11)
RDW: 15.3 % (ref 11.5–15.5)
WBC: 10.3 10*3/uL (ref 4.0–10.5)

## 2017-02-15 LAB — PROTIME-INR
INR: 1.55
Prothrombin Time: 18.4 seconds — ABNORMAL HIGH (ref 11.4–15.2)

## 2017-02-15 LAB — GLUCOSE, CAPILLARY
GLUCOSE-CAPILLARY: 201 mg/dL — AB (ref 65–99)
Glucose-Capillary: 168 mg/dL — ABNORMAL HIGH (ref 65–99)
Glucose-Capillary: 241 mg/dL — ABNORMAL HIGH (ref 65–99)
Glucose-Capillary: 149 mg/dL — ABNORMAL HIGH (ref 65–99)

## 2017-02-15 LAB — BRAIN NATRIURETIC PEPTIDE: B Natriuretic Peptide: 184.2 pg/mL — ABNORMAL HIGH (ref 0.0–100.0)

## 2017-02-15 LAB — LEGIONELLA PNEUMOPHILA SEROGP 1 UR AG: L. pneumophila Serogp 1 Ur Ag: NEGATIVE

## 2017-02-15 MED ORDER — WARFARIN SODIUM 5 MG PO TABS
7.5000 mg | ORAL_TABLET | Freq: Once | ORAL | Status: AC
  Administered 2017-02-15: 17:00:00 7.5 mg via ORAL
  Filled 2017-02-15: qty 1

## 2017-02-15 MED ORDER — FUROSEMIDE 10 MG/ML IJ SOLN
40.0000 mg | Freq: Once | INTRAMUSCULAR | Status: DC
  Filled 2017-02-15: qty 4

## 2017-02-15 MED ORDER — FUROSEMIDE 10 MG/ML IJ SOLN
40.0000 mg | Freq: Once | INTRAMUSCULAR | Status: AC
  Administered 2017-02-15: 40 mg via INTRAVENOUS
  Filled 2017-02-15: qty 4

## 2017-02-15 MED ORDER — IPRATROPIUM-ALBUTEROL 0.5-2.5 (3) MG/3ML IN SOLN
3.0000 mL | Freq: Three times a day (TID) | RESPIRATORY_TRACT | Status: DC
  Administered 2017-02-15 – 2017-02-17 (×6): 3 mL via RESPIRATORY_TRACT
  Filled 2017-02-15 (×6): qty 3

## 2017-02-15 MED ORDER — IPRATROPIUM-ALBUTEROL 0.5-2.5 (3) MG/3ML IN SOLN
3.0000 mL | Freq: Four times a day (QID) | RESPIRATORY_TRACT | Status: DC
  Administered 2017-02-15 (×3): 3 mL via RESPIRATORY_TRACT
  Filled 2017-02-15 (×3): qty 3

## 2017-02-15 MED ORDER — IPRATROPIUM-ALBUTEROL 0.5-2.5 (3) MG/3ML IN SOLN: 3.0000 mL | RESPIRATORY_TRACT | Status: DC | PRN

## 2017-02-15 NOTE — Progress Notes (Signed)
PROGRESS NOTE    Amber [name]  JIR:[id number] DOB: 1934/02/12 DOA: 02/06/2017 PCP: Antony Contras, MD  Brief Narrative:  83 y.o.femalepast medical history significant of atrial fibrillation,COPD (although patient and family does not recall this diagnosis being made), chronic diastolic CHF, CAD s/p CABG, gout, mild dementia who presents from home with dyspnea and a fall.Per her daughter, the patient has had a runny nose, sore throat and a cough for about 2 days. Her grandchildren were sick with the same illness a few days ago. She has become steadily short of breath and this morning was breathing with rapid shallow respirations. She was admitted to stepdown unit for respiratory failure, COPD exacerbation/bronchitis  02/12/2017-patient and daughter reports that she had a bad night.she was doing well yesterday.she satrted wheezing heavily with sob.her meds were changed to po form yesterday.  02/13/2017-patient reports that she had a better night. She received MiraLAX Dulcolax and had bowel movements last night.Daughter requested pulmonary and cardiology evaluation. Appreciate their input. Her two-view chest x-ray that was done today Persistent small left pleural effusion. Improved aeration of the left lung base without definite evidence of pneumonia. kubstill shows a large stool burden in the colon.  02/14/2017-daughter reports patient had a better night last night.  However not completely back to baseline she did have a couple of bowel movements.  02/15/2017 patient resting in bed daughter reports that patient  had a better night.  She has been started on chest precautions which has helped some.  Assessment & Plan:   Principal Problem:   Acute on chronic respiratory failure with hypoxia (HCC) Active Problems:   Atrial fibrillation (HCC)   Chronic diastolic heart failure (HCC)   Hypothyroidism   COPD exacerbation (HCC)   Dementia   SIRS (systemic inflammatory response syndrome)  (HCC)   Occult blood in stools   Lactic acidosis  Viral bronchitis/COPD/chronic rhinitis/possible pneumonia with a repeat 2 view chest x-ray with no evidence of infiltrates. She has been started on antibiotics since admission. Continue SVN treatments.Appreciate speech therapy input. Continue Solu-Medrol 60 mg every 12 for now. Titrate the dose as patient tolerates.?if mucomyst will help.defer to pulm.. Chest x-ray tomorrow.  DC Rocephin today.  Constipation better with multiple stool softners. Chronic atrial fibrillation on Coumadin  History of chronic CHF noted cardiology increase the dose of Lasix for twice a day for just 1 day.  Her creatinine has bumped up some with the extra dose of Lasix.  Will follow-up daily labs.  Discussed with daughter [name].       DVT prophylaxis: Coumadin Code Status: DNR Family Communication: Discussed with family Disposition Plan TBD  Consultants: P CCM, cardiology      Procedures: None Antimicrobials Rocephin to be stopped today Subjective: Feels better   Objective: Vitals:   02/15/17 0402 02/15/17 0648 02/15/17 0827 02/15/17 1132  BP:  (!) 157/77    Pulse:  74    Resp:  16    Temp:  97.6 F (36.4 C)    TempSrc:  Oral    SpO2: 97% 96% 96% 98%  Weight:  71.2 kg (156 lb 15.5 oz)    Height:        Intake/Output Summary (Last 24 hours) at 02/15/2017 1136 Last data filed at 02/15/2017 1000 Gross per 24 hour  Intake 600 ml  Output -  Net 600 ml   Filed Weights   02/07/17 2157 02/13/17 1114 02/15/17 0648  Weight: 70.3 kg (155 lb 0 oz) 72.5 kg (159 lb 12.8 oz)  71.2 kg (156 lb 15.5 oz)    Examination:  General exam: Appears calm and comfortable  Respiratory system: Coarse to auscultation. Respiratory effort normal. Cardiovascular system: S1 & S2 heard, RRR. No JVD, murmurs, rubs, gallops or clicks. No pedal edema. Gastrointestinal system: Abdomen is nondistended, soft and nontender. No organomegaly or masses felt. Normal  bowel sounds heard. Central nervous system: Alert and oriented. No focal neurological deficits. Extremities: Symmetric 5 x 5 power. Skin: No rashes, lesions or ulcers Psychiatry: Judgement and insight appear normal. Mood & affect appropriate.     Data Reviewed: I have personally reviewed following labs and imaging studies  CBC: Recent Labs  Lab 02/09/17 0517 02/10/17 0522 02/11/17 0413 02/13/17 0442 02/15/17 0557  WBC 12.5* 8.3 8.1 7.9 10.3  HGB 12.2 12.9 13.0 13.1 15.1*  HCT 37.7 39.7 40.1 39.5 45.9  MCV 90.2 89.8 89.7 88.0 88.8  PLT 246 228 242 261 382   Basic Metabolic Panel: Recent Labs  Lab 02/10/17 0522 02/11/17 0413 02/12/17 1559 02/14/17 0408 02/15/17 0557  NA 139 138 138 137 137  K 4.0 3.9 5.0 4.1 4.8  CL 105 102 101 97* 96*  CO2 26 27 28 31  32  GLUCOSE 188* 186* 225* 180* 195*  BUN 31* 31* 36* 38* 43*  CREATININE 0.91 0.99 1.05* 0.98 1.24*  CALCIUM 8.9 8.5* 8.7* 8.5* 8.9   GFR: Estimated Creatinine Clearance: 35.8 mL/min (A) (by C-G formula based on SCr of 1.24 mg/dL (H)). Liver Function Tests: No results for input(s): AST, ALT, ALKPHOS, BILITOT, PROT, ALBUMIN in the last 168 hours. No results for input(s): LIPASE, AMYLASE in the last 168 hours. No results for input(s): AMMONIA in the last 168 hours. Coagulation Profile: Recent Labs  Lab 02/11/17 0413 02/12/17 0424 02/13/17 0442 02/14/17 0408 02/15/17 0557  INR 3.52 2.99 2.08 1.70 1.55   Cardiac Enzymes: Recent Labs  Lab 02/09/17 0111  TROPONINI 0.10*   BNP (last 3 results) Recent Labs    01/23/17 1156  PROBNP 2,015*   HbA1C: No results for input(s): HGBA1C in the last 72 hours. CBG: Recent Labs  Lab 02/14/17 0754 02/14/17 1223 02/14/17 1658 02/14/17 2159 02/15/17 0727  GLUCAP 137* 285* 155* 187* 168*   Lipid Profile: No results for input(s): CHOL, HDL, LDLCALC, TRIG, CHOLHDL, LDLDIRECT in the last 72 hours. Thyroid Function Tests: No results for input(s): TSH, T4TOTAL,  FREET4, T3FREE, THYROIDAB in the last 72 hours. Anemia Panel: No results for input(s): VITAMINB12, FOLATE, FERRITIN, TIBC, IRON, RETICCTPCT in the last 72 hours. Sepsis Labs: Recent Labs  Lab 02/14/17 0408  PROCALCITON <0.10    Recent Results (from the past 240 hour(s))  Culture, blood (Routine X 2) w Reflex to ID Panel     Status: None   Collection Time: 02/06/17  9:56 AM  Result Value Ref Range Status   Specimen Description BLOOD RIGHT HAND  Final   Special Requests IN PEDIATRIC BOTTLE Blood Culture adequate volume  Final   Culture   Final    NO GROWTH 5 DAYS Performed at [facility], [place] [address], [place], [place] [address]    Report Status 02/11/2017 FINAL  Final  Respiratory Panel by PCR     Status: Abnormal   Collection Time: 02/06/17 10:00 AM  Result Value Ref Range Status   Adenovirus NOT DETECTED NOT DETECTED Final   Coronavirus 229E NOT DETECTED NOT DETECTED Final   Coronavirus HKU1 NOT DETECTED NOT DETECTED Final   Coronavirus NL63 NOT DETECTED NOT DETECTED  Final   Coronavirus OC43 NOT DETECTED NOT DETECTED Final   Metapneumovirus NOT DETECTED NOT DETECTED Final   Rhinovirus / Enterovirus DETECTED (A) NOT DETECTED Final   Influenza A NOT DETECTED NOT DETECTED Final   Influenza B NOT DETECTED NOT DETECTED Final   Parainfluenza Virus 1 NOT DETECTED NOT DETECTED Final   Parainfluenza Virus 2 NOT DETECTED NOT DETECTED Final   Parainfluenza Virus 3 NOT DETECTED NOT DETECTED Final   Parainfluenza Virus 4 NOT DETECTED NOT DETECTED Final   Respiratory Syncytial Virus NOT DETECTED NOT DETECTED Final   Bordetella pertussis NOT DETECTED NOT DETECTED Final   Chlamydophila pneumoniae NOT DETECTED NOT DETECTED Final   Mycoplasma pneumoniae NOT DETECTED NOT DETECTED Final    Comment: Performed at [facility], [place] [address], [place], [place] [address]  MRSA PCR Screening     Status: None   Collection Time: 02/06/17 11:41 AM  Result Value Ref Range Status    MRSA by PCR NEGATIVE NEGATIVE Final    Comment:        The GeneXpert MRSA Assay (FDA approved for NASAL specimens only), is one component of a comprehensive MRSA colonization surveillance program. It is not intended to diagnose MRSA infection nor to guide or monitor treatment for MRSA infections.   Culture, blood (Routine X 2) w Reflex to ID Panel     Status: None   Collection Time: 02/06/17 11:58 AM  Result Value Ref Range Status   Specimen Description BLOOD RIGHT ANTECUBITAL  Final   Special Requests   Final    BOTTLES DRAWN AEROBIC AND ANAEROBIC Blood Culture adequate volume   Culture   Final    NO GROWTH 5 DAYS Performed at [facility], [address], [place], [place] [address]    Report Status 02/11/2017 FINAL  Final         Radiology Studies: Dg Chest 2 View  Result Date: 02/13/2017 CLINICAL DATA:  Hypoxemia.  Pneumonia.  History of COPD. EXAM: CHEST  2 VIEW COMPARISON:  02/12/2017 FINDINGS: Sequelae of prior CABG are again identified. The cardiac silhouette is borderline enlarged. There is a persistent small left pleural effusion. Mild hazy density in the left lung base on yesterday's study has improved. The right lung remains clear. No pneumothorax is identified. Chronic thoracic and lumbar spine compression fractures are partially visualized. IMPRESSION: Persistent small left pleural effusion. Improved aeration of the left lung base without definite evidence of pneumonia. Electronically Signed   By: Logan Bores M.D.   On: 02/13/2017 13:14   Dg Abd 1 View  Result Date: 02/13/2017 CLINICAL DATA:  No bowel movement for several days. EXAM: ABDOMEN - 1 VIEW COMPARISON:  None. FINDINGS: Large amount of stool seen throughout the colon. No abnormal bowel dilatation is noted. Phleboliths are noted in the pelvis. IMPRESSION: Large stool burden is noted. Electronically Signed   By: Marijo Conception, M.D.   On: 02/13/2017 13:13        Scheduled Meds: . allopurinol  300  mg Oral Daily  . amitriptyline  20 mg Oral QHS  . amLODipine  10 mg Oral QHS  . benzonatate  100 mg Oral TID  . bisacodyl  10 mg Oral Daily  . bisacodyl  10 mg Rectal Once  . citalopram  20 mg Oral BID  . digoxin  0.125 mg Oral Q M,W,F  . donepezil  10 mg Oral Daily  . fluticasone  2 spray Each Nare BID  . guaiFENesin  600 mg Oral BID  . insulin aspart  0-5 Units Subcutaneous QHS  . insulin aspart  0-9 Units Subcutaneous TID WC  . ipratropium  2 spray Nasal TID  . ipratropium-albuterol  3 mL Nebulization QID  . levothyroxine  25 mcg Oral QAC breakfast  . losartan  50 mg Oral Daily  . memantine  28 mg Oral Daily  . methylPREDNISolone (SOLU-MEDROL) injection  40 mg Intravenous Q12H  . metoprolol succinate  25 mg Oral Daily  . neomycin-bacitracin-polymyxin   Topical Daily  . pantoprazole  40 mg Oral BID AC  . polyethylene glycol  17 g Oral Daily  . QUEtiapine  50 mg Oral QHS  . Warfarin - Pharmacist Dosing Inpatient   Does not apply q1800   Continuous Infusions: . cefTRIAXone (ROCEPHIN)  IV 1 g (02/15/17 1124)     LOS: 9 days     Georgette Shell, MD [facility]   If 7PM-7AM, please contact night-coverage [email or web address] Password [email or web address] 02/15/2017, 11:36 AM

## 2017-02-15 NOTE — Progress Notes (Signed)
Family were concerned that the patient had not had scheduled Lasix 40 mg tablet. Patient's daughter initially did not want patient to have Lasix IV 40 mg but when daughter discussed Lasix administration with the PCP on call it was decided to give IV lasix 40 mg.

## 2017-02-15 NOTE — Progress Notes (Signed)
[place] for warfarin Indication: atrial fibrillation  Allergies  Allergen Reactions  . Ezetimibe Other (See Comments)    Myalgia  . Welchol [Colesevelam Hcl] Other (See Comments)    Muscle aches  . Adhesive [Tape] Itching, Rash and Other (See Comments)    Burning   . Ceclor [Cefaclor] Other (See Comments)    Unknown allergic reaction  . Elastic Bandages & [Zinc] Rash and Other (See Comments)    Turns red on the areas it touches  . Latex Itching, Rash and Other (See Comments)    Burning   . Lipitor [Atorvastatin Calcium] Other (See Comments)    Increased fibromyalgia pain  . Mevacor [Lovastatin] Other (See Comments)    Increased fibromyalgia pain  . Pravachol Other (See Comments)    Increased fibromyalgia pain  . Vasotec Other (See Comments)    Unknown allergic reaction    Patient Measurements: Height: 5\' 5"  (165.1 cm) Weight: 156 lb 15.5 oz (71.2 kg) IBW/kg (Calculated) : 57   Vital Signs: Temp: 97.6 F (36.4 C) (01/06 0648) Temp Source: Oral (01/06 0648) BP: 157/77 (01/06 0648) Pulse Rate: 74 (01/06 0648)  Labs: Recent Labs    02/12/17 1559 02/13/17 0442 02/14/17 0408 02/15/17 0557  HGB  --  13.1  --  15.1*  HCT  --  39.5  --  45.9  PLT  --  261  --  295  LABPROT  --  23.2* 19.8* 18.4*  INR  --  2.08 1.70 1.55  CREATININE 1.05*  --  0.98 1.24*    Estimated Creatinine Clearance: 35.8 mL/min (A) (by C-G formula based on SCr of 1.24 mg/dL (H)).   Assessment: 83 y.o. female past medical history significant of atrial fibrillation on warfarin,COPD, chronic diastolic CHF, CAD s/p CABG, gout, mild dementia who presents from home with dyspnea and a fall.    Home dose of warfarin 5mg  daily, except 7.5 mg on Tues and Saturday  Today, 02/15/2017:  INR now subtherapeutic after holding x 2 days and resuming reduced dose  CBC: Hgb and plt stable wnl as of 1/4  DI: doxycycline stopped 1/2  Home meds celecoxib and  clopidogrel increase risk of bleeding with warfarin therapy  Now eating 75-100% of meals  Goal of Therapy:  INR 2-3   Plan:   Warfarin 7.5 mg PO tonight x 1; INR has been somewhat erratic but suspect patient is now less sensitive to warfarin with improvement in acute illness and PO intake.  Daily INR  F/u CBC  Based on current trends, would discharge on home regimen  Reuel Boom, PharmD, BCPS Pager: [phone] 02/15/2017, 1:09 PM

## 2017-02-16 ENCOUNTER — Inpatient Hospital Stay (HOSPITAL_COMMUNITY): Payer: Medicare Other

## 2017-02-16 DIAGNOSIS — R062 Wheezing: Secondary | ICD-10-CM

## 2017-02-16 DIAGNOSIS — I5032 Chronic diastolic (congestive) heart failure: Secondary | ICD-10-CM

## 2017-02-16 LAB — BASIC METABOLIC PANEL
Anion gap: 8 (ref 5–15)
BUN: 49 mg/dL — ABNORMAL HIGH (ref 6–20)
CO2: 31 mmol/L (ref 22–32)
Calcium: 8.4 mg/dL — ABNORMAL LOW (ref 8.9–10.3)
Chloride: 96 mmol/L — ABNORMAL LOW (ref 101–111)
Creatinine, Ser: 1.18 mg/dL — ABNORMAL HIGH (ref 0.44–1.00)
GFR calc Af Amer: 49 mL/min — ABNORMAL LOW (ref >60)
GFR calc non Af Amer: 42 mL/min — ABNORMAL LOW (ref >60)
Glucose, Bld: 171 mg/dL — ABNORMAL HIGH (ref 65–99)
Potassium: 4.6 mmol/L (ref 3.5–5.1)
Sodium: 135 mmol/L (ref 135–145)

## 2017-02-16 LAB — CBC
HCT: 39.8 % (ref 36.0–46.0)
Hemoglobin: 13 g/dL (ref 12.0–15.0)
MCH: 29 pg (ref 26.0–34.0)
MCHC: 32.7 g/dL (ref 30.0–36.0)
MCV: 88.8 fL (ref 78.0–100.0)
Platelets: 253 10*3/uL (ref 150–400)
RBC: 4.48 MIL/uL (ref 3.87–5.11)
RDW: 15.5 % (ref 11.5–15.5)
WBC: 11 10*3/uL — ABNORMAL HIGH (ref 4.0–10.5)

## 2017-02-16 LAB — GLUCOSE, CAPILLARY
GLUCOSE-CAPILLARY: 222 mg/dL — AB (ref 65–99)
GLUCOSE-CAPILLARY: 122 mg/dL — AB (ref 65–99)
GLUCOSE-CAPILLARY: 147 mg/dL — AB (ref 65–99)
Glucose-Capillary: 150 mg/dL — ABNORMAL HIGH (ref 65–99)

## 2017-02-16 LAB — PROTIME-INR
INR: 1.73
Prothrombin Time: 20.1 seconds — ABNORMAL HIGH (ref 11.4–15.2)

## 2017-02-16 MED ORDER — PREDNISONE 20 MG PO TABS
30.0000 mg | ORAL_TABLET | Freq: Every day | ORAL | Status: AC
  Administered 2017-02-17: 30 mg via ORAL
  Filled 2017-02-16: qty 1

## 2017-02-16 MED ORDER — QUETIAPINE FUMARATE 25 MG PO TABS: 25.0000 mg | ORAL_TABLET | Freq: Every day | ORAL | Status: DC

## 2017-02-16 MED ORDER — METHYLPREDNISOLONE SODIUM SUCC 40 MG IJ SOLR
40.0000 mg | INTRAMUSCULAR | Status: DC
  Administered 2017-02-16: 40 mg via INTRAVENOUS
  Filled 2017-02-16: qty 1

## 2017-02-16 MED ORDER — FAMOTIDINE 20 MG PO TABS
20.0000 mg | ORAL_TABLET | Freq: Every day | ORAL | Status: DC
  Administered 2017-02-16 – 2017-02-17 (×2): 20 mg via ORAL
  Filled 2017-02-16 (×2): qty 1

## 2017-02-16 MED ORDER — ACETYLCYSTEINE 20 % IN SOLN
4.0000 mL | Freq: Once | RESPIRATORY_TRACT | Status: AC
  Administered 2017-02-16: 4 mL via RESPIRATORY_TRACT
  Filled 2017-02-16: qty 4

## 2017-02-16 MED ORDER — PREDNISONE 20 MG PO TABS
20.0000 mg | ORAL_TABLET | Freq: Every day | ORAL | Status: DC
  Filled 2017-02-16: qty 1

## 2017-02-16 MED ORDER — WARFARIN SODIUM 5 MG PO TABS: 5.0000 mg | ORAL_TABLET | Freq: Once | ORAL | Status: DC

## 2017-02-16 MED ORDER — PREDNISONE 10 MG PO TABS: 10.0000 mg | ORAL_TABLET | Freq: Every day | ORAL | Status: DC

## 2017-02-16 MED ORDER — FAMOTIDINE 20 MG PO TABS: 20.0000 mg | ORAL_TABLET | Freq: Every day | ORAL | Status: DC

## 2017-02-16 MED ORDER — PREDNISONE 5 MG PO TABS: 5.0000 mg | ORAL_TABLET | Freq: Every day | ORAL | Status: DC

## 2017-02-16 MED ORDER — WARFARIN SODIUM 5 MG PO TABS
5.0000 mg | ORAL_TABLET | Freq: Once | ORAL | Status: AC
Start: 2017-02-16 — End: 2017-02-16
  Administered 2017-02-16: 5 mg via ORAL
  Filled 2017-02-16: qty 1

## 2017-02-16 MED ORDER — GUAIFENESIN ER 600 MG PO TB12
1200.0000 mg | ORAL_TABLET | Freq: Two times a day (BID) | ORAL | Status: DC
  Administered 2017-02-16 – 2017-02-17 (×3): 1200 mg via ORAL
  Filled 2017-02-16 (×3): qty 2

## 2017-02-16 NOTE — Progress Notes (Addendum)
  Speech Language Pathology Treatment: Dysphagia  Patient Details Name: Amber [name] MRN: [id number] DOB: 11-Mar-1934 Today's Date: 02/16/2017 Time: 1330-1340 SLP Time Calculation (min) (ACUTE ONLY): 10 min  Assessment / Plan / Recommendation Clinical Impression  Pt observed consuming lunch - self feeding- chicken and tea.  Good tolerance of po intake without indications of airway compromise.  Pt does become mildly dyspneic during intake which will contribute to episodic aspiration risk.  Per SLP conversation with daughter Amber [name] on the phone, family requests MBS to be done.  Amber [name] reports pt coughs with with liquids since her CVA approximately 20 years ago.   She reports concern with pt "not getting over pneumonia" and questions if aspiration may contributing factor. SLP advised would need to obtain approval for MBS at this time, to which daughter Amber [name] stated "she will".    Will plan MBS if MD approves during this hospital course. Recommend continue diet with strict aspiration/reflux precautions.     Of note, spoke to grandson at 1420 and advised order not received yet for MBS.  He reports pt tolerated lunch well, eating most of it with NO coughing.  Requested he inform aunt and his mother to lack of order received at this time.    HPI HPI: pt is an 83 yo female adm to [facility] with respiratory difficulties and weakness, s/p fall.  Pt found to have rhinovirus and now with concerns for RUL and left lobe pna.  PMH + for h/o smoking from age [age], dementia, cva in 2001 with left sided weakness, cervical spinal stenosis, Afib, COPD, bronchitis.  Pt also was around recent sick contact prior to admission.  Pt denies dysphagia as does her daughter.  Pt constantly wiping her nose during session.  Neck imaging showed Endplate degenerative changes are seen anteriorly from C4-C5, C7-T1.        SLP Plan  Continue with current plan of care       Recommendations  Diet recommendations: Regular;Thin  liquid Liquids provided via: Cup Medication Administration: Whole meds with puree Compensations: Minimize environmental distractions;Slow rate;Small sips/bites Postural Changes and/or Swallow Maneuvers: Out of bed for meals;Seated upright 90 degrees                Oral Care Recommendations: Oral care BID SLP Visit Diagnosis: Dysphagia, unspecified (R13.10) Plan: Continue with current plan of care       GO               Amber [name], [facility] SLP [phone]  Amber [name] 02/16/2017, 1:44 PM

## 2017-02-16 NOTE — Progress Notes (Signed)
[place] for warfarin Indication: atrial fibrillation  Allergies  Allergen Reactions  . Ezetimibe Other (See Comments)    Myalgia  . Welchol [Colesevelam Hcl] Other (See Comments)    Muscle aches  . Adhesive [Tape] Itching, Rash and Other (See Comments)    Burning   . Ceclor [Cefaclor] Other (See Comments)    Unknown allergic reaction  . Elastic Bandages & [Zinc] Rash and Other (See Comments)    Turns red on the areas it touches  . Latex Itching, Rash and Other (See Comments)    Burning   . Lipitor [Atorvastatin Calcium] Other (See Comments)    Increased fibromyalgia pain  . Mevacor [Lovastatin] Other (See Comments)    Increased fibromyalgia pain  . Pravachol Other (See Comments)    Increased fibromyalgia pain  . Vasotec Other (See Comments)    Unknown allergic reaction    Patient Measurements: Height: 5\' 5"  (165.1 cm) Weight: 158 lb 8.2 oz (71.9 kg) IBW/kg (Calculated) : 57   Vital Signs: Temp: 97.4 F (36.3 C) (01/07 0616) Temp Source: Axillary (01/07 0616) BP: 133/71 (01/07 0616) Pulse Rate: 67 (01/07 0616)  Labs: Recent Labs    02/14/17 0408 02/15/17 0557 02/16/17 0445  HGB  --  15.1* 13.0  HCT  --  45.9 39.8  PLT  --  295 253  LABPROT 19.8* 18.4* 20.1*  INR 1.70 1.55 1.73  CREATININE 0.98 1.24* 1.18*    Estimated Creatinine Clearance: 37.8 mL/min (A) (by C-G formula based on SCr of 1.18 mg/dL (H)).   Assessment: 83 y.o. female past medical history significant of atrial fibrillation on warfarin,COPD, chronic diastolic CHF, CAD s/p CABG, gout, mild dementia who presents from home with dyspnea and a fall.   Home dose of warfarin 5mg  daily, except 7.5 mg on Tues and Saturday  Today, 02/16/2017:  INR 1.73, subtherapeutic after holding x3 days and resuming on 1/3  CBC: Hgb and plt stable and WNL  DI: Levaquin x1 on 12/28 likely contributed to supratherapeutic INR.  Doxycycline stopped 1/2  Home meds celecoxib  and clopidogrel increase risk of bleeding with warfarin therapy  Now eating 75-100% of meals  Goal of Therapy:  INR 2-3   Plan:   Warfarin 5 mg PO tonight at 1800 x 1  Daily INR  F/u CBC  Based on current trends, would discharge on home regimen  Gretta Arab PharmD, BCPS Pager [phone] 02/16/2017 7:23 AM

## 2017-02-16 NOTE — Evaluation (Signed)
Occupational Therapy Evaluation Patient Details Name: Amber [name] MRN: [id number] DOB: March 07, 1934 Today's Date: 02/16/2017    History of Present Illness 83 y.o. female admitted on 02/06/17 for SOB, fall at home. dx with acute respiratory distress, bronchitis with hypoxia (h/o COPD) with suspected viral infection (culture results pending). Pt also with severe sepsis, tachycardia, tachypnea, lactic acidosis.  Pt with significant PMH of memory issues, HTN, CVA with left sided weakness, fibromyalgia, dementia, CAD, COPD, chronic diastolic CHF, cervical spine stenosis, A-fib on coumadin, and CABG.     Clinical Impression   Pt is making slow, but steady progress.  She fatigues quickly with ADL activity.  She requires min - mod A, overall, and min A for functional mobility.  DOE 3/4.  Will continue to follow.       Follow Up Recommendations  Home health OT;Supervision/Assistance - 24 hour    Equipment Recommendations  3 in 1 bedside commode    Recommendations for Other Services       Precautions / Restrictions Precautions Precautions: Fall Precaution Comments: recent h/o falls Required Braces or Orthoses: Other Brace/Splint Other Brace/Splint: left leg bil upright AFO      Mobility Bed Mobility                  Transfers Overall transfer level: Needs assistance Equipment used: Rolling walker (2 wheeled) Transfers: Sit to/from [facility] Sit to Stand: Min guard;Mod assist Stand pivot transfers: Min assist       General transfer comment: assist to power up into standing, and assist to steady     Balance Overall balance assessment: Needs assistance;History of Falls Sitting-balance support: Feet supported Sitting balance-Leahy Scale: Fair     Standing balance support: Single extremity supported;Bilateral upper extremity supported;During functional activity Standing balance-Leahy Scale: Poor Standing balance comment: reliant on UE support                             ADL either performed or assessed with clinical judgement   ADL Overall ADL's : Needs assistance/impaired     Grooming: Wash/dry hands;Wash/dry face;Brushing hair;Standing;Min guard                   Armed forces technical officer: Moderate assistance;Ambulation;Comfort height toilet;Grab bars;RW Armed forces technical officer Details (indicate cue type and reason): assist to power up into standing  Toileting- Clothing Manipulation and Hygiene: Moderate assistance;Sit to/from stand       Functional mobility during ADLs: Minimal assistance;Rolling walker       Vision         Perception     Praxis      Pertinent Vitals/Pain Pain Assessment: No/denies pain     Hand Dominance     Extremity/Trunk Assessment             Communication     Cognition Arousal/Alertness: Awake/alert Behavior During Therapy: WFL for tasks assessed/performed Overall Cognitive Status: History of cognitive impairments - at baseline                                     General Comments  DOE 3/4.  Audible wheeze noted.  RT in to see pt after OT     Exercises     Shoulder Instructions      Home Living  Prior Functioning/Environment                   OT Problem List:        OT Treatment/Interventions:      OT Goals(Current goals can be found in the care plan section)    OT Frequency: Min 2X/week   Barriers to D/C:            Co-evaluation              AM-PAC PT "6 Clicks" Daily Activity     Outcome Measure Help from another person eating meals?: A Little Help from another person taking care of personal grooming?: A Little Help from another person toileting, which includes using toliet, bedpan, or urinal?: A Lot Help from another person bathing (including washing, rinsing, drying)?: A Lot Help from another person to put on and taking off regular upper body clothing?: A Little Help  from another person to put on and taking off regular lower body clothing?: A Lot 6 Click Score: 15   End of Session Equipment Utilized During Treatment: Rolling walker Nurse Communication: Mobility status  Activity Tolerance: Patient limited by fatigue Patient left: in chair;with call bell/phone within reach;with family/visitor present  OT Visit Diagnosis: Muscle weakness (generalized) (M62.81);Unsteadiness on feet (R26.81)                Time: 5809-9833 OT Time Calculation (min): 22 min Charges:  OT General Charges $OT Visit: 1 Visit OT Treatments $Self Care/Home Management : 8-22 mins G-Codes:     [facility], OTR/L [phone]   Lucille Passy M 02/16/2017, 7:14 PM

## 2017-02-16 NOTE — Progress Notes (Signed)
PROGRESS NOTE    Amber [name]  ZYS:[id number] DOB: 1934/04/21 DOA: 02/06/2017 PCP: Antony Contras, MD  Brief Narrative:83 y.o.femalepast medical history significant of atrial fibrillation,COPD (although patient and family does not recall this diagnosis being made), chronic diastolic CHF, CAD s/p CABG, gout, mild dementia who presents from home with dyspnea and a fall.Per her daughter, the patient has had a runny nose, sore throat and a cough for about 2 days. Her grandchildren were sick with the same illness a few days ago. She has become steadily short of breath and this morning was breathing with rapid shallow respirations. She was admitted to stepdown unit for respiratory failure, COPD exacerbation/bronchitis  02/12/2017-patient and daughter reports that she had a bad night.she was doing well yesterday.she satrted wheezing heavily with sob.her meds were changed to po form yesterday.  02/13/2017-patient reports that she had a better night. She received MiraLAX Dulcolax and had bowel movements last night.Daughter requested pulmonary and cardiology evaluation. Appreciate their input. Her two-view chest x-ray that was done today Persistent small left pleural effusion. Improved aeration of the left lung base without definite evidence of pneumonia. kubstill shows a large stool burden in the colon.  02/14/2017-daughter reports patient had a better night last night. However not completely back to baseline she did have a couple of bowel movements.  02/15/2017 patient resting in bed daughter reports that patient  had a better night.  She has been started on chest precautions which has helped some.  1/7-daughter very anxious and concerned about slow improvement.all  Notes reviewed.   Assessment & Plan:   Principal Problem:   Acute on chronic respiratory failure with hypoxia (HCC) Active Problems:   Atrial fibrillation (HCC)   Chronic diastolic heart failure (HCC)   Hypothyroidism  COPD exacerbation (HCC)   Dementia   SIRS (systemic inflammatory response syndrome) (HCC)   Occult blood in stools   Lactic acidosis   Viral bronchitis/COPD/chronic rhinitis/possible pneumonia with a repeat 2 view chest x-ray with no evidence of infiltrates. She has been started on antibiotics since admission. Continue SVN treatments.Appreciate speech therapy input.dc solumedrol.start po steroids.  Constipation better with multiple stool softners. Chronic atrial fibrillation on Coumadin  History of chronic CHF noted cardiology increase the dose of Lasix for twice a day for just 1 day.  Her creatinine has bumped up some with the extra dose of Lasix.  Will follow-up daily labs.  Discussed with daughter [name].      DVT prophylaxis:lovenox Code Status:dnr Family Communicationdw daughters Disposition Plan:plan home tomorrow Consultants:  pccm,cards Procedures: Antimicrobials:none Subjective: No new complaints  Objective: Vitals:   02/15/17 2321 02/16/17 0616 02/16/17 0923 02/16/17 1407  BP: 113/81 133/71  130/90  Pulse:  67  74  Resp:  16  18  Temp:  (!) 97.4 F (36.3 C)  (!) 97.4 F (36.3 C)  TempSrc:  Axillary  Oral  SpO2:  93% 94% 96%  Weight:  71.9 kg (158 lb 8.2 oz)    Height:        Intake/Output Summary (Last 24 hours) at 02/16/2017 1523 Last data filed at 02/16/2017 0841 Gross per 24 hour  Intake 720 ml  Output 1450 ml  Net -730 ml   Filed Weights   02/13/17 1114 02/15/17 0648 02/16/17 0616  Weight: 72.5 kg (159 lb 12.8 oz) 71.2 kg (156 lb 15.5 oz) 71.9 kg (158 lb 8.2 oz)    Examination:  General exam: Appears calm and comfortable  Respiratory system: few wheezes auscultation. Respiratory effort normal.  Cardiovascular system: S1 & S2 heard, RRR. No JVD, murmurs, rubs, gallops or clicks. No pedal edema. Gastrointestinal system: Abdomen is nondistended, soft and nontender. No organomegaly or masses felt. Normal bowel sounds heard. Central nervous  system: Alert and oriented. No focal neurological deficits. Extremities: Symmetric 5 x 5 power. Skin: No rashes, lesions or ulcers Psychiatry: Judgement and insight appear normal. Mood & affect appropriate.     Data Reviewed: I have personally reviewed following labs and imaging studies  CBC: Recent Labs  Lab 02/10/17 0522 02/11/17 0413 02/13/17 0442 02/15/17 0557 02/16/17 0445  WBC 8.3 8.1 7.9 10.3 11.0*  HGB 12.9 13.0 13.1 15.1* 13.0  HCT 39.7 40.1 39.5 45.9 39.8  MCV 89.8 89.7 88.0 88.8 88.8  PLT 228 242 261 295 854   Basic Metabolic Panel: Recent Labs  Lab 02/11/17 0413 02/12/17 1559 02/14/17 0408 02/15/17 0557 02/16/17 0445  NA 138 138 137 137 135  K 3.9 5.0 4.1 4.8 4.6  CL 102 101 97* 96* 96*  CO2 27 28 31  32 31  GLUCOSE 186* 225* 180* 195* 171*  BUN 31* 36* 38* 43* 49*  CREATININE 0.99 1.05* 0.98 1.24* 1.18*  CALCIUM 8.5* 8.7* 8.5* 8.9 8.4*   GFR: Estimated Creatinine Clearance: 37.8 mL/min (A) (by C-G formula based on SCr of 1.18 mg/dL (H)). Liver Function Tests: No results for input(s): AST, ALT, ALKPHOS, BILITOT, PROT, ALBUMIN in the last 168 hours. No results for input(s): LIPASE, AMYLASE in the last 168 hours. No results for input(s): AMMONIA in the last 168 hours. Coagulation Profile: Recent Labs  Lab 02/12/17 0424 02/13/17 0442 02/14/17 0408 02/15/17 0557 02/16/17 0445  INR 2.99 2.08 1.70 1.55 1.73   Cardiac Enzymes: No results for input(s): CKTOTAL, CKMB, CKMBINDEX, TROPONINI in the last 168 hours. BNP (last 3 results) Recent Labs    01/23/17 1156  PROBNP 2,015*   HbA1C: No results for input(s): HGBA1C in the last 72 hours. CBG: Recent Labs  Lab 02/15/17 1151 02/15/17 1700 02/15/17 2210 02/16/17 0822 02/16/17 1209  GLUCAP 241* 201* 149* 150* 222*   Lipid Profile: No results for input(s): CHOL, HDL, LDLCALC, TRIG, CHOLHDL, LDLDIRECT in the last 72 hours. Thyroid Function Tests: No results for input(s): TSH, T4TOTAL, FREET4,  T3FREE, THYROIDAB in the last 72 hours. Anemia Panel: No results for input(s): VITAMINB12, FOLATE, FERRITIN, TIBC, IRON, RETICCTPCT in the last 72 hours. Sepsis Labs: Recent Labs  Lab 02/14/17 0408  PROCALCITON <0.10    No results found for this or any previous visit (from the past 240 hour(s)).       Radiology Studies: Dg Chest 1 View  Result Date: 02/15/2017 CLINICAL DATA:  Shortness of breath, cough, congestion EXAM: CHEST 1 VIEW COMPARISON:  02/13/2017 FINDINGS: There is no focal parenchymal opacity. There is no pleural effusion or pneumothorax. The heart and mediastinal contours are unremarkable. There is evidence of prior CABG. The osseous structures are unremarkable. IMPRESSION: No active disease. Electronically Signed   By: Kathreen Devoid   On: 02/15/2017 13:23   Dg Abd 1 View  Result Date: 02/16/2017 CLINICAL DATA:  Abdominal distension EXAM: ABDOMEN - 1 VIEW COMPARISON:  02/13/2017 FINDINGS: Some mild retained fecal material is noted within the right colon. Significant improvement in the degree of constipation is noted. No obstructive changes are seen. No free air is noted. Degenerative changes of lumbar spine are noted. Chronic Compression deformity of T12 and L1 are seen. IMPRESSION: Significant improvement in the degree of constipation. Electronically Signed   By:  Inez Catalina M.D.   On: 02/16/2017 07:27        Scheduled Meds: . acetylcysteine  4 mL Nebulization Once  . allopurinol  300 mg Oral Daily  . amitriptyline  20 mg Oral QHS  . amLODipine  10 mg Oral QHS  . benzonatate  100 mg Oral TID  . bisacodyl  10 mg Oral Daily  . citalopram  20 mg Oral BID  . digoxin  0.125 mg Oral Q M,W,F  . donepezil  10 mg Oral Daily  . famotidine  20 mg Oral Daily  . fluticasone  2 spray Each Nare BID  . furosemide  40 mg Intravenous Once  . guaiFENesin  1,200 mg Oral BID  . insulin aspart  0-5 Units Subcutaneous QHS  . insulin aspart  0-9 Units Subcutaneous TID WC  .  ipratropium  2 spray Nasal TID  . ipratropium-albuterol  3 mL Nebulization TID  . levothyroxine  25 mcg Oral QAC breakfast  . losartan  50 mg Oral Daily  . memantine  28 mg Oral Daily  . metoprolol succinate  25 mg Oral Daily  . neomycin-bacitracin-polymyxin   Topical Daily  . pantoprazole  40 mg Oral BID AC  . polyethylene glycol  17 g Oral Daily  . [START ON 02/17/2017] predniSONE  30 mg Oral Q breakfast   Followed by  . [START ON 02/18/2017] predniSONE  20 mg Oral Q breakfast   Followed by  . [START ON 02/19/2017] predniSONE  10 mg Oral Q breakfast   Followed by  . [START ON 02/21/2017] predniSONE  5 mg Oral Q breakfast  . QUEtiapine  25 mg Oral QHS  . warfarin  5 mg Oral ONCE-1800  . Warfarin - Pharmacist Dosing Inpatient   Does not apply q1800   Continuous Infusions:   LOS: 10 days       Georgette Shell, MD [facility] If 7PM-7AM, please contact night-coverage [email or web address] Password [facility] 02/16/2017, 3:23 PM

## 2017-02-16 NOTE — Progress Notes (Signed)
[facility] P[facility]  S: Still has a cough, some chest congestion  O:  Vitals:   02/15/17 2152 02/15/17 2321 02/16/17 0616 02/16/17 0923  BP: (!) 129/57 113/81 133/71   Pulse: 76  67   Resp: 16  16   Temp: 97.6 F (36.4 C)  (!) 97.4 F (36.3 C)   TempSrc: Oral  Axillary   SpO2: 97%  93% 94%  Weight:   71.9 kg (158 lb 8.2 oz)   Height:       RA  General:  Resting comfortably in chair HENT: NCAT OP clear PULM: Upper airway wheeze, normal effort, normal effort CV: RRR, no mgr GI: BS+, soft, nontender MSK: normal bulk and tone Neuro: awake, alert, no distress, MAEW  CXR reviewed, no emphysema  Impression/plan:  Acute wheezing in setting of rhinovirus infection: hard to know if this is related to baseline underlying lung disease as rhinovirus can certainly cause wheezing; however I think based on her clinical exam today that most of the wheezing is upper airway in nature (larynx) and related to persistent coughing.  Her vitals are normal and her CXR is clear so I don't think that further aggressive systemic therapy with steroids will make much of a difference.   > taper off prednisone over the next 5 days: 30mg  daily x 1 day, 20mg  daily x 1 day, 10mg  daily x 2 days, then 5mg  daily x 1 day > continue duoneb for now as you are doing > OK to give mucomyst x1 but wouldn't give more than this > continue chest PT for now > add famotidine (Silent GERD often causes upper airway wheezing) > conitnue tesalon for cough suppression  PCCM will be avialable prn  Should have outpatient PFT either with Pulmonary or with PCP  Roselie Awkward, MD [facility] Pager: [phone] Cell: [phone] After 3pm or if no response, call [phone]

## 2017-02-16 NOTE — Progress Notes (Signed)
Progress Note  Patient Name: Amber [name] Date of Encounter: 02/16/2017  Primary Cardiologist: Sinclair Grooms, MD   Subjective   Mild dyspnea; no chest pain  Inpatient Medications    Scheduled Meds: . acetylcysteine  4 mL Nebulization Once  . allopurinol  300 mg Oral Daily  . amitriptyline  20 mg Oral QHS  . amLODipine  10 mg Oral QHS  . benzonatate  100 mg Oral TID  . bisacodyl  10 mg Oral Daily  . bisacodyl  10 mg Rectal Once  . citalopram  20 mg Oral BID  . digoxin  0.125 mg Oral Q M,W,F  . donepezil  10 mg Oral Daily  . fluticasone  2 spray Each Nare BID  . furosemide  40 mg Intravenous Once  . guaiFENesin  1,200 mg Oral BID  . insulin aspart  0-5 Units Subcutaneous QHS  . insulin aspart  0-9 Units Subcutaneous TID WC  . ipratropium  2 spray Nasal TID  . ipratropium-albuterol  3 mL Nebulization TID  . levothyroxine  25 mcg Oral QAC breakfast  . losartan  50 mg Oral Daily  . memantine  28 mg Oral Daily  . methylPREDNISolone (SOLU-MEDROL) injection  40 mg Intravenous Q24H  . metoprolol succinate  25 mg Oral Daily  . neomycin-bacitracin-polymyxin   Topical Daily  . pantoprazole  40 mg Oral BID AC  . polyethylene glycol  17 g Oral Daily  . QUEtiapine  50 mg Oral QHS  . warfarin  5 mg Oral ONCE-1800  . Warfarin - Pharmacist Dosing Inpatient   Does not apply q1800   Continuous Infusions:  PRN Meds: acetaminophen **OR** acetaminophen, haloperidol lactate, ipratropium-albuterol, ondansetron **OR** ondansetron (ZOFRAN) IV   Vital Signs    Vitals:   02/15/17 2152 02/15/17 2321 02/16/17 0616 02/16/17 0923  BP: (!) 129/57 113/81 133/71   Pulse: 76  67   Resp: 16  16   Temp: 97.6 F (36.4 C)  (!) 97.4 F (36.3 C)   TempSrc: Oral  Axillary   SpO2: 97%  93% 94%  Weight:   158 lb 8.2 oz (71.9 kg)   Height:        Intake/Output Summary (Last 24 hours) at 02/16/2017 1309 Last data filed at 02/16/2017 0841 Gross per 24 hour  Intake 960 ml  Output 1450 ml  Net  -490 ml   Filed Weights   02/13/17 1114 02/15/17 0648 02/16/17 0616  Weight: 159 lb 12.8 oz (72.5 kg) 156 lb 15.5 oz (71.2 kg) 158 lb 8.2 oz (71.9 kg)      Physical Exam   GEN: Elderly No acute distress.   Neck: No JVD Cardiac: irregular, no murmurs, rubs, or gallops.  Respiratory: Clear to auscultation bilaterally. GI: Soft, nontender, non-distended  MS: No edema; No deformity. Neuro:  Nonfocal  Psych: Normal affect   Labs    Chemistry Recent Labs  Lab 02/14/17 0408 02/15/17 0557 02/16/17 0445  NA 137 137 135  K 4.1 4.8 4.6  CL 97* 96* 96*  CO2 31 32 31  GLUCOSE 180* 195* 171*  BUN 38* 43* 49*  CREATININE 0.98 1.24* 1.18*  CALCIUM 8.5* 8.9 8.4*  GFRNONAA 53* 40* 42*  GFRAA >60 46* 49*  ANIONGAP 9 9 8      Hematology Recent Labs  Lab 02/13/17 0442 02/15/17 0557 02/16/17 0445  WBC 7.9 10.3 11.0*  RBC 4.49 5.17* 4.48  HGB 13.1 15.1* 13.0  HCT 39.5 45.9 39.8  MCV 88.0 88.8  88.8  MCH 29.2 29.2 29.0  MCHC 33.2 32.9 32.7  RDW 15.3 15.3 15.5  PLT 261 295 253    BNP Recent Labs  Lab 02/14/17 0408 02/15/17 0557  BNP 190.5* 184.2*     Radiology    Dg Chest 1 View  Result Date: 02/15/2017 CLINICAL DATA:  Shortness of breath, cough, congestion EXAM: CHEST 1 VIEW COMPARISON:  02/13/2017 FINDINGS: There is no focal parenchymal opacity. There is no pleural effusion or pneumothorax. The heart and mediastinal contours are unremarkable. There is evidence of prior CABG. The osseous structures are unremarkable. IMPRESSION: No active disease. Electronically Signed   By: Kathreen Devoid   On: 02/15/2017 13:23   Dg Abd 1 View  Result Date: 02/16/2017 CLINICAL DATA:  Abdominal distension EXAM: ABDOMEN - 1 VIEW COMPARISON:  02/13/2017 FINDINGS: Some mild retained fecal material is noted within the right colon. Significant improvement in the degree of constipation is noted. No obstructive changes are seen. No free air is noted. Degenerative changes of lumbar spine are noted.  Chronic Compression deformity of T12 and L1 are seen. IMPRESSION: Significant improvement in the degree of constipation. Electronically Signed   By: Inez Catalina M.D.   On: 02/16/2017 07:27    Patient Profile     Amber Pulse Bushis a 83 y.o.femalewith a hx of CAD s/p CABG 2000, chronic atrial fib on Coumadin, chronic diastolic CHF, CKD stage III, hyperlipidemia (statin intolerant, not previously candidate for PCSK9 due to chronically elevated CK levels),suspected COPD, 40 pyr tobacco abuse,HTN with hypertensive heart disease, cerebrovascular disease, dementia, CVA with left sided weakness, fibromyalgia, hypothyroidism, osteoporosis admitted with a fall and acute/chronic respiratory failure+ rhino virus with initial improvement and then worsening  and  being seen  for the evaluation of CHF  Assessment & Plan    1 acute on chronic diastolic congestive heart failure-BUN and creatinine ratio worse today.  Patient is not volume overloaded on examination.  BNP minimally increased.  Would hold further diuresis.  Would resume home dose of Lasix in next 24-48 hrs as her renal function improves.  Some of increased BUN may be secondary to steroids.  2 rhinovirus-this appears to be the predominant issue.  Further management per pulmonary and internal medicine.   3 permanent atrial fibrillation-continue present medications for rate control.  Continue Coumadin.  We will sign off.  Please call with questions.  For questions or updates, please contact [employer] Please consult [email or web address] for contact info under Cardiology/STEMI.      Signed, Kirk Ruths, MD  02/16/2017, 1:09 PM

## 2017-02-17 ENCOUNTER — Inpatient Hospital Stay (HOSPITAL_COMMUNITY): Payer: Medicare Other

## 2017-02-17 LAB — GLUCOSE, CAPILLARY
Glucose-Capillary: 148 mg/dL — ABNORMAL HIGH (ref 65–99)
Glucose-Capillary: 147 mg/dL — ABNORMAL HIGH (ref 65–99)
Glucose-Capillary: 167 mg/dL — ABNORMAL HIGH (ref 65–99)

## 2017-02-17 LAB — PROTIME-INR
INR: 1.95
Prothrombin Time: 22 seconds — ABNORMAL HIGH (ref 11.4–15.2)

## 2017-02-17 MED ORDER — PREDNISONE 10 MG PO TABS: 10.0000 mg | ORAL_TABLET | Freq: Every day | ORAL | 0 refills | Status: DC

## 2017-02-17 MED ORDER — PREDNISONE 5 MG PO TABS: 5.0000 mg | ORAL_TABLET | Freq: Every day | ORAL | 0 refills | Status: DC

## 2017-02-17 MED ORDER — PREDNISONE 10 MG PO TABS: ORAL_TABLET | ORAL | 0 refills | Status: DC

## 2017-02-17 MED ORDER — BISACODYL 5 MG PO TBEC: 10.0000 mg | DELAYED_RELEASE_TABLET | Freq: Every day | ORAL | 0 refills | Status: DC

## 2017-02-17 MED ORDER — GUAIFENESIN ER 600 MG PO TB12: 1200.0000 mg | ORAL_TABLET | Freq: Two times a day (BID) | ORAL | Status: DC

## 2017-02-17 MED ORDER — FLUTICASONE PROPIONATE 50 MCG/ACT NA SUSP: 2.0000 | Freq: Two times a day (BID) | NASAL | 2 refills | Status: DC

## 2017-02-17 MED ORDER — WARFARIN SODIUM 5 MG PO TABS
5.0000 mg | ORAL_TABLET | Freq: Once | ORAL | Status: AC
  Administered 2017-02-17: 5 mg via ORAL
  Filled 2017-02-17: qty 1

## 2017-02-17 MED ORDER — PANTOPRAZOLE SODIUM 40 MG PO TBEC: 40.0000 mg | DELAYED_RELEASE_TABLET | Freq: Every day | ORAL | 0 refills | Status: DC

## 2017-02-17 MED ORDER — BENZONATATE 100 MG PO CAPS: 100.0000 mg | ORAL_CAPSULE | Freq: Three times a day (TID) | ORAL | 0 refills | Status: DC

## 2017-02-17 MED ORDER — QUETIAPINE FUMARATE 25 MG PO TABS: 25.0000 mg | ORAL_TABLET | Freq: Every day | ORAL | 0 refills | Status: DC

## 2017-02-17 MED ORDER — PREDNISONE 20 MG PO TABS: 20.0000 mg | ORAL_TABLET | Freq: Every day | ORAL | 0 refills | Status: DC

## 2017-02-17 MED ORDER — POLYETHYLENE GLYCOL 3350 17 G PO PACK: 17.0000 g | PACK | Freq: Every day | ORAL | 0 refills | Status: DC

## 2017-02-17 MED ORDER — FUROSEMIDE 40 MG PO TABS: 20.0000 mg | ORAL_TABLET | Freq: Every day | ORAL | 0 refills | Status: DC

## 2017-02-17 MED ORDER — IPRATROPIUM BROMIDE 0.03 % NA SOLN: 2.0000 | Freq: Three times a day (TID) | NASAL | 12 refills | Status: DC

## 2017-02-17 MED ORDER — KLOR-CON M20 20 MEQ PO TBCR: 10.0000 meq | EXTENDED_RELEASE_TABLET | Freq: Every day | ORAL | 0 refills | Status: DC

## 2017-02-17 MED ORDER — FAMOTIDINE 20 MG PO TABS: 20.0000 mg | ORAL_TABLET | Freq: Every day | ORAL | Status: DC

## 2017-02-17 NOTE — Progress Notes (Signed)
Progress Note  Patient Name: Amber [name] Date of Encounter: 02/17/2017  Primary Cardiologist: Amber Grooms, MD   Subjective   Pt denies dyspnea or CP this AM.   Inpatient Medications    Scheduled Meds: . allopurinol  300 mg Oral Daily  . amitriptyline  20 mg Oral QHS  . amLODipine  10 mg Oral QHS  . benzonatate  100 mg Oral TID  . bisacodyl  10 mg Oral Daily  . citalopram  20 mg Oral BID  . digoxin  0.125 mg Oral Q M,W,F  . donepezil  10 mg Oral Daily  . famotidine  20 mg Oral Daily  . fluticasone  2 spray Each Nare BID  . furosemide  40 mg Intravenous Once  . guaiFENesin  1,200 mg Oral BID  . insulin aspart  0-5 Units Subcutaneous QHS  . insulin aspart  0-9 Units Subcutaneous TID WC  . ipratropium  2 spray Nasal TID  . ipratropium-albuterol  3 mL Nebulization TID  . levothyroxine  25 mcg Oral QAC breakfast  . losartan  50 mg Oral Daily  . memantine  28 mg Oral Daily  . metoprolol succinate  25 mg Oral Daily  . neomycin-bacitracin-polymyxin   Topical Daily  . pantoprazole  40 mg Oral BID AC  . polyethylene glycol  17 g Oral Daily  . predniSONE  30 mg Oral Q breakfast   Followed by  . [START ON 02/18/2017] predniSONE  20 mg Oral Q breakfast   Followed by  . [START ON 02/19/2017] predniSONE  10 mg Oral Q breakfast   Followed by  . [START ON 02/21/2017] predniSONE  5 mg Oral Q breakfast  . QUEtiapine  25 mg Oral QHS  . Warfarin - Pharmacist Dosing Inpatient   Does not apply q1800   Continuous Infusions:  PRN Meds: acetaminophen **OR** acetaminophen, haloperidol lactate, ipratropium-albuterol, ondansetron **OR** ondansetron (ZOFRAN) IV   Vital Signs    Vitals:   02/16/17 1700 02/16/17 2153 02/16/17 2220 02/17/17 0652  BP:  139/60  (!) 145/94  Pulse:  69  (!) 58  Resp:  18  18  Temp:  97.6 F (36.4 C)  (!) 97.3 F (36.3 C)  TempSrc:  Oral  Oral  SpO2: 94% 95% 94% 96%  Weight:    156 lb (70.8 kg)  Height:        Intake/Output Summary (Last 24 hours)  at 02/17/2017 0806 Last data filed at 02/16/2017 1026 Gross per 24 hour  Intake 10 ml  Output 1000 ml  Net -990 ml   Filed Weights   02/15/17 0648 02/16/17 0616 02/17/17 0652  Weight: 156 lb 15.5 oz (71.2 kg) 158 lb 8.2 oz (71.9 kg) 156 lb (70.8 kg)      Physical Exam   GEN: WD Elderly NAD Neck: No JVD, supple Cardiac: irregular Respiratory: Diffuse rhonchi GI: Soft, nontender, non-distended, no masses  MS: No edema Neuro:  Grossly intact   Labs    Chemistry Recent Labs  Lab 02/14/17 0408 02/15/17 0557 02/16/17 0445  NA 137 137 135  K 4.1 4.8 4.6  CL 97* 96* 96*  CO2 31 32 31  GLUCOSE 180* 195* 171*  BUN 38* 43* 49*  CREATININE 0.98 1.24* 1.18*  CALCIUM 8.5* 8.9 8.4*  GFRNONAA 53* 40* 42*  GFRAA >60 46* 49*  ANIONGAP 9 9 8      Hematology Recent Labs  Lab 02/13/17 0442 02/15/17 0557 02/16/17 0445  WBC 7.9 10.3 11.0*  RBC 4.49 5.17* 4.48  HGB 13.1 15.1* 13.0  HCT 39.5 45.9 39.8  MCV 88.0 88.8 88.8  MCH 29.2 29.2 29.0  MCHC 33.2 32.9 32.7  RDW 15.3 15.3 15.5  PLT 261 295 253    BNP Recent Labs  Lab 02/14/17 0408 02/15/17 0557  BNP 190.5* 184.2*     Radiology    Dg Chest 1 View  Result Date: 02/15/2017 CLINICAL DATA:  Shortness of breath, cough, congestion EXAM: CHEST 1 VIEW COMPARISON:  02/13/2017 FINDINGS: There is no focal parenchymal opacity. There is no pleural effusion or pneumothorax. The heart and mediastinal contours are unremarkable. There is evidence of prior CABG. The osseous structures are unremarkable. IMPRESSION: No active disease. Electronically Signed   By: Kathreen Devoid   On: 02/15/2017 13:23   Dg Abd 1 View  Result Date: 02/16/2017 CLINICAL DATA:  Abdominal distension EXAM: ABDOMEN - 1 VIEW COMPARISON:  02/13/2017 FINDINGS: Some mild retained fecal material is noted within the right colon. Significant improvement in the degree of constipation is noted. No obstructive changes are seen. No free air is noted. Degenerative changes of  lumbar spine are noted. Chronic Compression deformity of T12 and L1 are seen. IMPRESSION: Significant improvement in the degree of constipation. Electronically Signed   By: Inez Catalina M.D.   On: 02/16/2017 07:27    Patient Profile     Amber Mccarrick Bushis a 83 y.o.femalewith a hx of CAD s/p CABG 2000, chronic atrial fib on Coumadin, chronic diastolic CHF, CKD stage III, hyperlipidemia (statin intolerant, not previously candidate for PCSK9 due to chronically elevated CK levels),suspected COPD, 40 pyr tobacco abuse,HTN with hypertensive heart disease, cerebrovascular disease, dementia, CVA with left sided weakness, fibromyalgia, hypothyroidism, osteoporosis admitted with a fall and acute/chronic respiratory failure+ rhino virus with initial improvement and then worsening  and  being seen  for the evaluation of CHF  Assessment & Plan    1 acute on chronic diastolic congestive heart failure-Pt remains euvolemic on exam; predominant issue appears to have been viral. Would resume home dose of Lasix in next 24-48 hrs as her renal function improves.   2 rhinovirus-this appears to be the predominant issue.  Continue present meds.   3 permanent atrial fibrillation-continue present medications for rate control.  Continue Coumadin.  We will sign off.  Please call with questions.  For questions or updates, please contact Amber [name] Please consult [email or web address] for contact info under Cardiology/STEMI.      Signed, Amber Ruths, MD  02/17/2017, 8:06 AM

## 2017-02-17 NOTE — Progress Notes (Signed)
Pt will discharge home with [facility]/in house rep is aware.

## 2017-02-17 NOTE — Progress Notes (Signed)
Physical Therapy Treatment Patient Details Name: EVERLENE CUNNING MRN: [id number] DOB: 08/18/1934 Today's Date: 02/17/2017    History of Present Illness 83 y.o. female admitted on 02/06/17 for SOB, fall at home. dx with acute respiratory distress, bronchitis with hypoxia (h/o COPD) with suspected viral infection (culture results pending). Pt also with severe sepsis, tachycardia, tachypnea, lactic acidosis.  Pt with significant PMH of memory issues, HTN, CVA with left sided weakness, fibromyalgia, dementia, CAD, COPD, chronic diastolic CHF, cervical spine stenosis, A-fib on coumadin, and CABG.      PT Comments    Pt participated well. She continues to require Min assist for safe mobility. Daughter present during session. She stated d/c plan is still for home, possibly today. Continue to recommend appropriate 24 hour care for pt until she returns to baseline.     Follow Up Recommendations  Home health PT;Supervision/Assistance - 24 hour     Equipment Recommendations  None recommended by PT    Recommendations for Other Services       Precautions / Restrictions Precautions Precautions: Fall Precaution Comments: recent h/o falls Required Braces or Orthoses: Other Brace/Splint Other Brace/Splint: left leg bil upright AFO Restrictions Weight Bearing Restrictions: No    Mobility  Bed Mobility               General bed mobility comments: oob in recliner  Transfers Overall transfer level: Needs assistance Equipment used: Rolling walker (2 wheeled) Transfers: Sit to/from Stand Sit to Stand: Min assist         General transfer comment: assist to power up into standing, and assist to steady and control descent. VCs hand placement  Ambulation/Gait Ambulation/Gait assistance: Min assist Ambulation Distance (Feet): 160 Feet Assistive device: Rolling walker (2 wheeled) Gait Pattern/deviations: Step-through pattern;Decreased stride length     General Gait Details: Intermittent  assist to steady. L LE instability noted.   Stairs            Wheelchair Mobility    Modified Rankin (Stroke Patients Only)       Balance                                            Cognition Arousal/Alertness: Awake/alert Behavior During Therapy: WFL for tasks assessed/performed Overall Cognitive Status: History of cognitive impairments - at baseline                                        Exercises      General Comments        Pertinent Vitals/Pain Pain Assessment: No/denies pain    Home Living                      Prior Function            PT Goals (current goals can now be found in the care plan section) Progress towards PT goals: Progressing toward goals    Frequency    Min 3X/week      PT Plan Current plan remains appropriate    Co-evaluation              AM-PAC PT "6 Clicks" Daily Activity  Outcome Measure  Difficulty turning over in bed (including adjusting bedclothes, sheets and blankets)?: A Little Difficulty moving from lying on  back to sitting on the side of the bed? : A Little Difficulty sitting down on and standing up from a chair with arms (e.g., wheelchair, bedside commode, etc,.)?: Unable Help needed moving to and from a bed to chair (including a wheelchair)?: A Little Help needed walking in hospital room?: A Little Help needed climbing 3-5 steps with a railing? : A Little 6 Click Score: 16    End of Session Equipment Utilized During Treatment: Gait belt Activity Tolerance: Patient tolerated treatment well Patient left: in chair;with call bell/phone within reach;with family/visitor present   PT Visit Diagnosis: Difficulty in walking, not elsewhere classified (R26.2);Muscle weakness (generalized) (M62.81);History of falling (Z91.81)     Time: 1206-1227 PT Time Calculation (min) (ACUTE ONLY): 21 min  Charges:  $Gait Training: 8-22 mins                    G Codes:           Weston Anna, MPT Pager: [phone]

## 2017-02-17 NOTE — Discharge Summary (Addendum)
Physician Discharge Summary  ORA BOLLIG XLK:[id number] DOB: Dec 10, 1934 DOA: 02/06/2017  PCP: Antony Contras, MD  Admit date: 02/06/2017 Discharge date: 02/17/2017  Admitted From: home Disposition: home  Recommendations for Outpatient Follow-up:  1. Follow up with PCP in 1-2 weeks 2. Please obtain BMP/CBC in one week 3. Follow up inr in one week  Home Health yes Equipment/Devicesnone  Discharge Condition:stable CODE STATUS dnr Diet recommendation: cardiac  Brief/Interim Summary:83 y.o.femalepast medical history significant of atrial fibrillation,COPD (although patient and family does not recall this diagnosis being made), chronic diastolic CHF, CAD s/p CABG, gout, mild dementia who presents from home with dyspnea and a fall.Per her daughter, the patient has had a runny nose, sore throat and a cough for about 2 days. Her grandchildren were sick with the same illness a few days ago. She has become steadily short of breath and this morning was breathing with rapid shallow respirations. She was admitted to stepdown unit for respiratory failure, COPD exacerbation/bronchitis  02/12/2017-patient and daughter reports that she had a bad night.she was doing well yesterday.she satrted wheezing heavily with sob.her meds were changed to po form yesterday.  02/13/2017-patient reports that she had a better night. She received MiraLAX Dulcolax and had bowel movements last night.Daughter requested pulmonary and cardiology evaluation. Appreciate their input. Her two-view chest x-ray that was done today Persistent small left pleural effusion. Improved aeration of the left lung base without definite evidence of pneumonia. kubstill shows a large stool burden in the colon.  02/14/2017-daughter reports patient had a better night last night. However not completely back to baseline she did have a couple of bowel movements.  02/15/2017 patient resting in bed daughter reports that patient had a better  night. She has been started on chest precautions which has helped some.  1/7-daughter very anxious and concerned about slow improvement.all  Notes reviewed   Discharge Diagnoses:  Principal Problem:   Acute on chronic respiratory failure with hypoxia (HCC) Active Problems:   Atrial fibrillation (HCC)   Chronic diastolic heart failure (HCC)   Hypothyroidism   COPD exacerbation (HCC)   Dementia   SIRS (systemic inflammatory response syndrome) (HCC)   Occult blood in stools   Lactic acidosis Hospital course-patient was admitted with a diagnosis of hypoxic respiratory failure secondary to possible pneumonia/COPD/viral bronchitis/chf.  She was treated with multiple antibiotics.  She was seen in consultation by cardiology and pulmonary.  Patient remains congested even with the above treatments he was started on Solu-Medrol.  Which was tapered down at the time of admission and discharge.  She was also started on H2 blocker for possible GERD and Flonase for allergies.  Patient did have have a history of congestive heart failure she was on Lasix once a day at home which was restarted here.  At the time of discharge patient appears euvolemic.  She did not receive her usual dose of Lasix for the last 2 days as she appears more euvolemic to dry side.  Advised patient's family to restart Lasix at a lower dose of 20 mg in 2 days.  And to follow-up with PCP for monitoring of her INR as well as electrolytes and CBC.  A modified barium swallow was ordered and which will be done prior to discharge.  Heme positive stools patient had a heme's positive stools upon admission however her hemoglobin remained stable and patient is on Coumadin for atrial fibrillation.  Patient will follow up with PCP and get a GI referral if needed.  Plavix was stopped  by cardiology.  Continue Coumadin.  Atrial fibrillation continue Coumadin and rate controlling agents.   Discharge Instructions   Allergies as of 02/17/2017       Reactions   Ezetimibe Other (See Comments)   Myalgia   Welchol [colesevelam Hcl] Other (See Comments)   Muscle aches   Adhesive [tape] Itching, Rash, Other (See Comments)   Burning    Ceclor [cefaclor] Other (See Comments)   Unknown allergic reaction   Elastic Bandages & [zinc] Rash, Other (See Comments)   Turns red on the areas it touches   Latex Itching, Rash, Other (See Comments)   Burning    Lipitor [atorvastatin Calcium] Other (See Comments)   Increased fibromyalgia pain   Mevacor [lovastatin] Other (See Comments)   Increased fibromyalgia pain   Pravachol Other (See Comments)   Increased fibromyalgia pain   Vasotec Other (See Comments)   Unknown allergic reaction      Medication List    TAKE these medications   allopurinol 300 MG tablet Commonly known as:  ZYLOPRIM Take 1 tablet (300 mg total) by mouth every other day. What changed:  when to take this   amitriptyline 10 MG tablet Commonly known as:  ELAVIL TAKE 2 TABLETS BY MOUTH AT BEDTIME What changed:    how much to take  how to take this  when to take this   amLODipine 5 MG tablet Commonly known as:  NORVASC Take 1 tablet (5 mg total) by mouth at bedtime.   benzonatate 100 MG capsule Commonly known as:  TESSALON Take 1 capsule (100 mg total) by mouth 3 (three) times daily.   bisacodyl 5 MG EC tablet Commonly known as:  DULCOLAX Take 2 tablets (10 mg total) by mouth daily. Start taking on:  02/18/2017   celecoxib 200 MG capsule Commonly known as:  CELEBREX Take 200 mg by mouth every morning.   citalopram 20 MG tablet Commonly known as:  CELEXA TAKE 1 TABLET BY MOUTH TWICE A DAY   clopidogrel 75 MG tablet Commonly known as:  PLAVIX TAKE 1 TABLET (75 MG TOTAL) BY MOUTH DAILY.   COUMADIN 5 MG tablet Generic drug:  warfarin Take as directed. If you are unsure how to take this medication, talk to your nurse or doctor. Original instructions:  TAKE AS DIRECTED BY COUMADIN CLINIC. Hold coumadin  November 24 th and November 25 th.  Recheck INR on Monday. What changed:    how much to take  how to take this  when to take this  additional instructions   digoxin 0.125 MG tablet Commonly known as:  LANOXIN Take 1 tablet (0.125 mg total) by mouth on Mondays, Wednesdays, & Fridays   donepezil 10 MG tablet Commonly known as:  ARICEPT Take 1 tablet (10 mg total) by mouth at bedtime. What changed:  when to take this   ergocalciferol 50000 units capsule Commonly known as:  VITAMIN D2 Take 50,000 Units by mouth 2 (two) times a week. Tuesday and Friday   famotidine 20 MG tablet Commonly known as:  PEPCID Take 1 tablet (20 mg total) by mouth daily. Start taking on:  02/18/2017   fluticasone 50 MCG/ACT nasal spray Commonly known as:  FLONASE Place 2 sprays into both nostrils 2 (two) times daily.   furosemide 40 MG tablet Commonly known as:  LASIX Take 0.5 tablets (20 mg total) by mouth daily. Start 1/10 What changed:    how much to take  additional instructions   guaiFENesin 600 MG 12 hr  tablet Commonly known as:  MUCINEX Take 2 tablets (1,200 mg total) by mouth 2 (two) times daily.   ipratropium 0.03 % nasal spray Commonly known as:  ATROVENT Place 2 sprays into the nose 3 (three) times daily.   KLOR-CON M20 20 MEQ tablet Generic drug:  potassium chloride SA Take 0.5 tablets (10 mEq total) by mouth daily. What changed:  how much to take   levothyroxine 25 MCG tablet Commonly known as:  SYNTHROID, LEVOTHROID Take 25 mcg by mouth every morning.   losartan 50 MG tablet Commonly known as:  COZAAR Take 1 tablet (50 mg total) by mouth daily.   memantine 28 MG Cp24 24 hr capsule Commonly known as:  NAMENDA XR TAKE ONE CAPSULE BY MOUTH EVERY DAY   metoprolol succinate 25 MG 24 hr tablet Commonly known as:  TOPROL-XL TAKE 1 TABLET (25 MG TOTAL) BY MOUTH DAILY. What changed:  See the new instructions.   nitroGLYCERIN 0.4 MG SL tablet Commonly known as:   NITROSTAT Place 1 tablet (0.4 mg total) under the tongue every 5 (five) minutes as needed for chest pain.   pantoprazole 40 MG tablet Commonly known as:  PROTONIX Take 1 tablet (40 mg total) by mouth daily.   polyethylene glycol packet Commonly known as:  MIRALAX / GLYCOLAX Take 17 g by mouth daily. Start taking on:  02/18/2017   predniSONE 20 MG tablet Commonly known as:  DELTASONE Take 1 tablet (20 mg total) by mouth daily with breakfast. Start taking on:  02/18/2017   predniSONE 10 MG tablet Commonly known as:  DELTASONE Take 1 tablet (10 mg total) by mouth daily with breakfast. Start taking on:  02/19/2017   predniSONE 5 MG tablet Commonly known as:  DELTASONE Take 1 tablet (5 mg total) by mouth daily with breakfast. Start taking on:  02/21/2017   PROAIR HFA 108 (90 Base) MCG/ACT inhaler Generic drug:  albuterol INHALE 2 PUFFS INTO LUNGS EVERY 6 HOURS AS NEEDED FOR WHEEZING   albuterol 108 (90 Base) MCG/ACT inhaler Commonly known as:  PROVENTIL HFA;VENTOLIN HFA Inhale 2 puffs into the lungs every 4 (four) hours as needed for wheezing or shortness of breath.   QUEtiapine 25 MG tablet Commonly known as:  SEROQUEL Take 1 tablet (25 mg total) by mouth at bedtime.       Allergies  Allergen Reactions  . Ezetimibe Other (See Comments)    Myalgia  . Welchol [Colesevelam Hcl] Other (See Comments)    Muscle aches  . Adhesive [Tape] Itching, Rash and Other (See Comments)    Burning   . Ceclor [Cefaclor] Other (See Comments)    Unknown allergic reaction  . Elastic Bandages & [Zinc] Rash and Other (See Comments)    Turns red on the areas it touches  . Latex Itching, Rash and Other (See Comments)    Burning   . Lipitor [Atorvastatin Calcium] Other (See Comments)    Increased fibromyalgia pain  . Mevacor [Lovastatin] Other (See Comments)    Increased fibromyalgia pain  . Pravachol Other (See Comments)    Increased fibromyalgia pain  . Vasotec Other (See Comments)     Unknown allergic reaction    Consultations:  pccm,cards   Procedures/Studies: Dg Chest 1 View  Result Date: 02/15/2017 CLINICAL DATA:  Shortness of breath, cough, congestion EXAM: CHEST 1 VIEW COMPARISON:  02/13/2017 FINDINGS: There is no focal parenchymal opacity. There is no pleural effusion or pneumothorax. The heart and mediastinal contours are unremarkable. There is evidence of prior  CABG. The osseous structures are unremarkable. IMPRESSION: No active disease. Electronically Signed   By: Kathreen Devoid   On: 02/15/2017 13:23   Dg Chest 2 View  Result Date: 02/13/2017 CLINICAL DATA:  Hypoxemia.  Pneumonia.  History of COPD. EXAM: CHEST  2 VIEW COMPARISON:  02/12/2017 FINDINGS: Sequelae of prior CABG are again identified. The cardiac silhouette is borderline enlarged. There is a persistent small left pleural effusion. Mild hazy density in the left lung base on yesterday's study has improved. The right lung remains clear. No pneumothorax is identified. Chronic thoracic and lumbar spine compression fractures are partially visualized. IMPRESSION: Persistent small left pleural effusion. Improved aeration of the left lung base without definite evidence of pneumonia. Electronically Signed   By: Logan Bores M.D.   On: 02/13/2017 13:14   Dg Chest 2 View  Result Date: 02/08/2017 CLINICAL DATA:  Respiratory distress EXAM: CHEST  2 VIEW COMPARISON:  02/06/2017 chest radiograph. FINDINGS: Intact sternotomy wires. CABG clips overlie the mediastinum. Stable cardiomediastinal silhouette with borderline mild cardiomegaly. No pneumothorax. Small left pleural effusion, stable. No right pleural effusion. New patchy opacity in the peripheral mid to upper right lung. IMPRESSION: 1. New patchy opacity in the peripheral mid to upper right lung, which could represent a developing pneumonia or mild asymmetric pulmonary edema . Recommend attention on follow-up chest radiographs. 2. Borderline mild cardiomegaly, stable. 3.  Stable small left pleural effusion. Electronically Signed   By: Ilona Sorrel M.D.   On: 02/08/2017 11:03   Dg Wrist Complete Right  Result Date: 02/06/2017 CLINICAL DATA:  Fall.  Right-sided wrist pain. EXAM: RIGHT WRIST - COMPLETE 3+ VIEW COMPARISON:  None. FINDINGS: Advanced degenerative changes are again seen in the right wrist. Subluxation of the first [facility] joint is again noted. There is soft tissue swelling about the wrist. No acute fracture is evident. Vascular calcifications are noted. IMPRESSION: 1. Soft tissue swelling about the right wrist without acute fracture. 2. Advanced degenerative changes. 3. Atherosclerosis. Electronically Signed   By: San Morelle M.D.   On: 02/06/2017 08:00   Dg Abd 1 View  Result Date: 02/16/2017 CLINICAL DATA:  Abdominal distension EXAM: ABDOMEN - 1 VIEW COMPARISON:  02/13/2017 FINDINGS: Some mild retained fecal material is noted within the right colon. Significant improvement in the degree of constipation is noted. No obstructive changes are seen. No free air is noted. Degenerative changes of lumbar spine are noted. Chronic Compression deformity of T12 and L1 are seen. IMPRESSION: Significant improvement in the degree of constipation. Electronically Signed   By: Inez Catalina M.D.   On: 02/16/2017 07:27   Dg Abd 1 View  Result Date: 02/13/2017 CLINICAL DATA:  No bowel movement for several days. EXAM: ABDOMEN - 1 VIEW COMPARISON:  None. FINDINGS: Large amount of stool seen throughout the colon. No abnormal bowel dilatation is noted. Phleboliths are noted in the pelvis. IMPRESSION: Large stool burden is noted. Electronically Signed   By: Marijo Conception, M.D.   On: 02/13/2017 13:13   Ct Head Wo Contrast  Result Date: 02/06/2017 CLINICAL DATA:  Fall, on Coumadin, forehead abrasion, initial encounter. EXAM: CT HEAD WITHOUT CONTRAST CT CERVICAL SPINE WITHOUT CONTRAST TECHNIQUE: Multidetector CT imaging of the head and cervical spine was performed following the  standard protocol without intravenous contrast. Multiplanar CT image reconstructions of the cervical spine were also generated. COMPARISON:  07/31/2016. FINDINGS: CT HEAD FINDINGS Brain: No evidence of an acute infarct, acute hemorrhage, mass lesion, mass effect or hydrocephalus. Atrophy and  confluent periventricular low attenuation. Ventricular prominence is unchanged and in proportion to the degree of atrophy. Vascular: No hyperdense vessel or unexpected calcification. Skull: Normal. Negative for fracture or focal lesion. Sinuses/Orbits: No acute finding. Other: None. CT CERVICAL SPINE FINDINGS Alignment: Image quality is degraded by motion. Reversal of the normal cervical lordosis, centered at C6. Skull base and vertebrae: Image quality is degraded by motion. No definite fracture. Soft tissues and spinal canal: Image quality is degraded by motion. No definite acute finding. Disc levels: Image quality is degraded by motion. Multilevel facet hypertrophy. Endplate degenerative changes are seen anteriorly from C4-5 to C7-T1. Upper chest: Image quality is degraded by motion. Biapical pleuroparenchymal scarring. Other: None. IMPRESSION: 1. No acute intracranial abnormality. 2. Cervical spine images are degraded by motion. No definite fracture or subluxation. 3. Brain atrophy and chronic microvascular white matter ischemic changes. 4. Reversal of the normal cervical lordosis with degenerative disc disease. Electronically Signed   By: Lorin Picket M.D.   On: 02/06/2017 07:54   Ct Cervical Spine Wo Contrast  Result Date: 02/06/2017 CLINICAL DATA:  Fall, on Coumadin, forehead abrasion, initial encounter. EXAM: CT HEAD WITHOUT CONTRAST CT CERVICAL SPINE WITHOUT CONTRAST TECHNIQUE: Multidetector CT imaging of the head and cervical spine was performed following the standard protocol without intravenous contrast. Multiplanar CT image reconstructions of the cervical spine were also generated. COMPARISON:  07/31/2016.  FINDINGS: CT HEAD FINDINGS Brain: No evidence of an acute infarct, acute hemorrhage, mass lesion, mass effect or hydrocephalus. Atrophy and confluent periventricular low attenuation. Ventricular prominence is unchanged and in proportion to the degree of atrophy. Vascular: No hyperdense vessel or unexpected calcification. Skull: Normal. Negative for fracture or focal lesion. Sinuses/Orbits: No acute finding. Other: None. CT CERVICAL SPINE FINDINGS Alignment: Image quality is degraded by motion. Reversal of the normal cervical lordosis, centered at C6. Skull base and vertebrae: Image quality is degraded by motion. No definite fracture. Soft tissues and spinal canal: Image quality is degraded by motion. No definite acute finding. Disc levels: Image quality is degraded by motion. Multilevel facet hypertrophy. Endplate degenerative changes are seen anteriorly from C4-5 to C7-T1. Upper chest: Image quality is degraded by motion. Biapical pleuroparenchymal scarring. Other: None. IMPRESSION: 1. No acute intracranial abnormality. 2. Cervical spine images are degraded by motion. No definite fracture or subluxation. 3. Brain atrophy and chronic microvascular white matter ischemic changes. 4. Reversal of the normal cervical lordosis with degenerative disc disease. Electronically Signed   By: Lorin Picket M.D.   On: 02/06/2017 07:54   Dg Chest Port 1 View  Result Date: 02/12/2017 CLINICAL DATA:  Shortness of breath. History of COPD, atrial fibrillation, coronary artery disease and CABG, former smoker. EXAM: PORTABLE CHEST 1 VIEW COMPARISON:  Chest x-ray of February 08, 2017 FINDINGS: The lungs are hyperinflated with hemidiaphragm flattening. There is hazy increased density at the left lung base today. There is a small left pleural effusion. The right lung is well-expanded and clear. The heart is top-normal in size. The pulmonary vascularity is normal. There is calcification in the wall of the aortic arch. The sternal wires  are intact. IMPRESSION: COPD. Hazy increased density the left lung base more conspicuous today. This suggests subsegmental atelectasis or early pneumonia. There may be a trace of pleural fluid at the left lung base as well. When the patient can tolerate the procedure, a PA and lateral chest x-ray would be useful. Electronically Signed   By: David  Martinique M.D.   On: 02/12/2017 11:06  Dg Chest Port 1 View  Result Date: 02/06/2017 CLINICAL DATA:  Shortness of breath with fall EXAM: PORTABLE CHEST 1 VIEW COMPARISON:  January 03, 2017 FINDINGS: There is no edema or consolidation. Lungs are mildly hyperexpanded. Heart is upper normal in size with pulmonary vascularity within normal limits. Patient is status post internal mammary bypass grafting. There is aortic atherosclerosis. No adenopathy. No evident bone lesions. No pneumothorax. IMPRESSION: Aortic atherosclerosis. No edema or consolidation. Stable cardiac silhouette. Aortic Atherosclerosis (ICD10-I70.0). Electronically Signed   By: Lowella Grip III M.D.   On: 02/06/2017 08:00    (Echo, Carotid, EGD, Colonoscopy, ERCP)    Subjective:   Discharge Exam: Vitals:   02/17/17 0931 02/17/17 0933  BP:    Pulse:    Resp:    Temp:    SpO2: 99% 99%   Vitals:   02/17/17 0652 02/17/17 0831 02/17/17 0931 02/17/17 0933  BP: (!) 145/94     Pulse: (!) 58 65    Resp: 18     Temp: (!) 97.3 F (36.3 C)     TempSrc: Oral     SpO2: 96%  99% 99%  Weight: 70.8 kg (156 lb)     Height:        General: Pt is alert, awake, not in acute distress Cardiovascular: RRR, S1/S2 +, no rubs, no gallops Respiratory: CTA bilaterally, no wheezing, no rhonchi Abdominal: Soft, NT, ND, bowel sounds + Extremities: no edema, no cyanosis    The results of significant diagnostics from this hospitalization (including imaging, microbiology, ancillary and laboratory) are listed below for reference.     Microbiology: No results found for this or any previous visit  (from the past 240 hour(s)).   Labs: BNP (last 3 results) Recent Labs    02/06/17 0652 02/14/17 0408 02/15/17 0557  BNP 245.0* 190.5* 950.9*   Basic Metabolic Panel: Recent Labs  Lab 02/11/17 0413 02/12/17 1559 02/14/17 0408 02/15/17 0557 02/16/17 0445  NA 138 138 137 137 135  K 3.9 5.0 4.1 4.8 4.6  CL 102 101 97* 96* 96*  CO2 27 28 31  32 31  GLUCOSE 186* 225* 180* 195* 171*  BUN 31* 36* 38* 43* 49*  CREATININE 0.99 1.05* 0.98 1.24* 1.18*  CALCIUM 8.5* 8.7* 8.5* 8.9 8.4*   Liver Function Tests: No results for input(s): AST, ALT, ALKPHOS, BILITOT, PROT, ALBUMIN in the last 168 hours. No results for input(s): LIPASE, AMYLASE in the last 168 hours. No results for input(s): AMMONIA in the last 168 hours. CBC: Recent Labs  Lab 02/11/17 0413 02/13/17 0442 02/15/17 0557 02/16/17 0445  WBC 8.1 7.9 10.3 11.0*  HGB 13.0 13.1 15.1* 13.0  HCT 40.1 39.5 45.9 39.8  MCV 89.7 88.0 88.8 88.8  PLT 242 261 295 253   Cardiac Enzymes: No results for input(s): CKTOTAL, CKMB, CKMBINDEX, TROPONINI in the last 168 hours. BNP: Invalid input(s): POCBNP CBG: Recent Labs  Lab 02/16/17 0822 02/16/17 1209 02/16/17 1659 02/16/17 2148 02/17/17 0746  GLUCAP 150* 222* 122* 147* 148*   D-Dimer No results for input(s): DDIMER in the last 72 hours. Hgb A1c No results for input(s): HGBA1C in the last 72 hours. Lipid Profile No results for input(s): CHOL, HDL, LDLCALC, TRIG, CHOLHDL, LDLDIRECT in the last 72 hours. Thyroid function studies No results for input(s): TSH, T4TOTAL, T3FREE, THYROIDAB in the last 72 hours.  Invalid input(s): FREET3 Anemia work up No results for input(s): VITAMINB12, FOLATE, FERRITIN, TIBC, IRON, RETICCTPCT in the last 72 hours. Urinalysis  Component Value Date/Time   COLORURINE YELLOW 07/31/2016 2106   APPEARANCEUR HAZY (A) 07/31/2016 2106   LABSPEC 1.012 07/31/2016 2106   PHURINE 5.0 07/31/2016 2106   GLUCOSEU NEGATIVE 07/31/2016 2106   GLUCOSEU  NEGATIVE 02/28/2013 1235   HGBUR SMALL (A) 07/31/2016 2106   BILIRUBINUR NEGATIVE 07/31/2016 2106   KETONESUR NEGATIVE 07/31/2016 2106   PROTEINUR NEGATIVE 07/31/2016 2106   UROBILINOGEN 0.2 02/28/2013 1235   NITRITE NEGATIVE 07/31/2016 2106   LEUKOCYTESUR LARGE (A) 07/31/2016 2106   Sepsis Labs Invalid input(s): PROCALCITONIN,  WBC,  LACTICIDVEN Microbiology No results found for this or any previous visit (from the past 240 hour(s)).   Time coordinating discharge: Over 30 minutes  SIGNED:   Georgette Shell, MD  [facility] 02/17/2017, 11:35 AM  If 7PM-7AM, please contact night-coverage [email or web address] Password [email or web address]

## 2017-02-17 NOTE — Progress Notes (Signed)
D/c to home w/ DTR via w/c all belongings and instructions given w/ verbal understanding.

## 2017-02-17 NOTE — Progress Notes (Signed)
To Radiology for [place] dtr w/ pt

## 2017-02-17 NOTE — Progress Notes (Signed)
Modified Barium Swallow Progress Note  Patient Details  Name: Amber [name] MRN: [id number] Date of Birth: April 30, 1934  Today's Date: 02/17/2017  Modified Barium Swallow completed.  Full report located under Chart Review in the Imaging Section.  Brief recommendations include the following:  Clinical Impression  Pt presents with functional oropharyngeal swallow without aspiration or penetration of any consistency tested.  Pt's swallow is timely and strong without significant residuals. She again appears with prominent cricopharyngeus as noted on prior MBS but this did not impair barium flow. Pt able to readily swallow tablet with thin liquid without difficulty.  Recommend continue regular/thin diet with general precautions. Daughter and pt educated to findings/recommendations.    Swallow Evaluation Recommendations       SLP Diet Recommendations: Regular solids;Thin liquid   Liquid Administration via: Cup;Straw   Medication Administration: Whole meds with liquid   Supervision: Patient able to self feed   Compensations: Minimize environmental distractions;Slow rate;Small sips/bites   Postural Changes: Remain semi-upright after after feeds/meals (Comment)   Oral Care Recommendations: Oral care BID       Luanna Salk, MS [facility] SLP [phone]  Amber [name] 02/17/2017,3:52 PM

## 2017-02-17 NOTE — Progress Notes (Signed)
Pt discharging home with [facility].  

## 2017-02-17 NOTE — Progress Notes (Addendum)
Order received for MBS, planned for today at approximately 1500.    Thanks. Luanna Salk, [facility] SLP [phone]

## 2017-02-17 NOTE — Progress Notes (Signed)
[place] for warfarin Indication: atrial fibrillation  Allergies  Allergen Reactions  . Ezetimibe Other (See Comments)    Myalgia  . Welchol [Colesevelam Hcl] Other (See Comments)    Muscle aches  . Adhesive [Tape] Itching, Rash and Other (See Comments)    Burning   . Ceclor [Cefaclor] Other (See Comments)    Unknown allergic reaction  . Elastic Bandages & [Zinc] Rash and Other (See Comments)    Turns red on the areas it touches  . Latex Itching, Rash and Other (See Comments)    Burning   . Lipitor [Atorvastatin Calcium] Other (See Comments)    Increased fibromyalgia pain  . Mevacor [Lovastatin] Other (See Comments)    Increased fibromyalgia pain  . Pravachol Other (See Comments)    Increased fibromyalgia pain  . Vasotec Other (See Comments)    Unknown allergic reaction    Patient Measurements: Height: 5\' 5"  (165.1 cm) Weight: 156 lb (70.8 kg) IBW/kg (Calculated) : 57   Vital Signs: Temp: 97.3 F (36.3 C) (01/08 0652) Temp Source: Oral (01/08 0652) BP: 145/94 (01/08 0652) Pulse Rate: 58 (01/08 0652)  Labs: Recent Labs    02/15/17 0557 02/16/17 0445 02/17/17 0446  HGB 15.1* 13.0  --   HCT 45.9 39.8  --   PLT 295 253  --   LABPROT 18.4* 20.1* 22.0*  INR 1.55 1.73 1.95  CREATININE 1.24* 1.18*  --     Estimated Creatinine Clearance: 37.5 mL/min (A) (by C-G formula based on SCr of 1.18 mg/dL (H)).   Assessment: 83 y.o. female past medical history significant of atrial fibrillation on warfarin,COPD, chronic diastolic CHF, CAD s/p CABG, gout, mild dementia who presents from home with dyspnea and a fall.   Home dose of warfarin 5mg  daily, except 7.5 mg on Tues and Saturday  Today, 02/17/2017:  INR 1.95, nearly therapeutic after holding x3 days and resuming on 1/3  CBC: Hgb and plt stable and WNL (1/7)  Drug-drug interactions: Levaquin x1 on 12/28 likely contributed to supratherapeutic INR.  Doxycycline stopped  1/2  Home meds celecoxib and clopidogrel increase risk of bleeding with warfarin therapy  Now eating 75-100% of meals  Goal of Therapy:  INR 2-3   Plan:   Warfarin 5 mg PO tonight at 1800 if not discharged.  Daily INR  F/u CBC  Based on current trends, would discharge on home regimen, warfarin 5mg  daily, except 7.5 mg on Tues and Saturday  [facility] PharmD, BCPS Pager [phone] 02/17/2017 7:57 AM

## 2017-02-17 NOTE — Care Management Important Message (Signed)
Important Message  Patient Details  Name: GRAYCE BUDDEN MRN: [id number] Date of Birth: 1934-06-18   Medicare Important Message Given:  Yes    Kerin Salen 02/17/2017, 10:43 AMImportant Message  Patient Details  Name: BREUNNA NORDMANN MRN: [id number] Date of Birth: [birth year]   Medicare Important Message Given:  Yes    Kerin Salen 02/17/2017, 10:43 AM

## 2017-02-18 ENCOUNTER — Telehealth: Payer: Self-pay | Admitting: Interventional Cardiology

## 2017-02-18 DIAGNOSIS — J441 Chronic obstructive pulmonary disease with (acute) exacerbation: Secondary | ICD-10-CM | POA: Diagnosis not present

## 2017-02-18 NOTE — Telephone Encounter (Signed)
Telephoned [facility] and gave orders to [facility] for INR check on 02/20/17 as pt was discharged from hospital yesterday and is on Prednisone 20mg , 10mg ,5mg  and 5mg  just for 4 doses.

## 2017-02-18 NOTE — Telephone Encounter (Signed)
Spoke with daughter, DPR on file.  Made her aware of recommendations per Dr. Tamala Julian.  Daughter verbalized understanding and was appreciative for assistance.

## 2017-02-18 NOTE — Telephone Encounter (Signed)
New Message  Liv from [facility] called requesting to speak with RN to see if she would need to check pts coumadin during her time of visiting pt in the next 2 weeks.  She also states pt lungs sound really congested post discharge from the hospital. She would like to know if the pt needs to be seen  Pt c/o medication issue:  1. Name of Medication: plavix and lasix  2. How are you currently taking this medication (dosage and times per day)? Lasix 40  3. Are you having a reaction (difficulty breathing--STAT)? no  4. What is your medication issue? Liv states pts family is concerned pt doseage for lasix was decreased and plavix was stopped. She states the family would like to check with Dr. Tamala Julian to make sure this is okay for pt.

## 2017-02-18 NOTE — Telephone Encounter (Signed)
No new recommendations at this time.  If lower extremity swelling or increased weight, may need to increase furosemide but would continue 20 mg for now since she had renal impairment during high stay.

## 2017-02-18 NOTE — Telephone Encounter (Signed)
Spoke with Liv, RN with [facility].  Pt d/c'ed from hospital yesterday.  Liv states pt is very congested.  Liv is sitting next to her and can hear what sounds like she has wet lungs.  Denies difficulty breathing or swelling.  Pt sleeps and sits in a recliner.  Vitals today were BP 116/68, HR 64 and regular, O2 95% and Resp 16.  Family concerned that Plavix was d/c'ed and Furosemide was decreased to 20mg  QD.  Scheduled pt to see Dr. Tamala Julian 1/15 at 1:40pm.  Advised I would send message to Dr. Tamala Julian for review and advisement.   Will also route to Coumadin team as Liv with [facility] needs to know about checking pt's INR while they are providing services in the home.

## 2017-02-20 ENCOUNTER — Ambulatory Visit
Admission: RE | Admit: 2017-02-20 | Discharge: 2017-02-20 | Disposition: A | Discharge status: Home / Self Care | Payer: Medicare Other | Source: Ambulatory Visit | Attending: Family Medicine | Admitting: Family Medicine

## 2017-02-20 ENCOUNTER — Ambulatory Visit (INDEPENDENT_AMBULATORY_CARE_PROVIDER_SITE_OTHER): Payer: Medicare Other | Admitting: Internal Medicine

## 2017-02-20 ENCOUNTER — Other Ambulatory Visit: Payer: Self-pay | Admitting: Family Medicine

## 2017-02-20 DIAGNOSIS — I482 Chronic atrial fibrillation: Secondary | ICD-10-CM | POA: Diagnosis not present

## 2017-02-20 DIAGNOSIS — J449 Chronic obstructive pulmonary disease, unspecified: Secondary | ICD-10-CM

## 2017-02-20 DIAGNOSIS — Z5181 Encounter for therapeutic drug level monitoring: Secondary | ICD-10-CM | POA: Diagnosis not present

## 2017-02-20 DIAGNOSIS — I4821 Permanent atrial fibrillation: Secondary | ICD-10-CM

## 2017-02-20 LAB — POCT INR: INR: 2.4

## 2017-02-21 ENCOUNTER — Telehealth: Payer: Self-pay | Admitting: Internal Medicine

## 2017-02-24 ENCOUNTER — Encounter: Payer: Self-pay | Admitting: Pulmonary Disease

## 2017-02-24 ENCOUNTER — Ambulatory Visit: Payer: Medicare Other | Admitting: Pulmonary Disease

## 2017-02-24 ENCOUNTER — Ambulatory Visit: Payer: Medicare Other | Admitting: Interventional Cardiology

## 2017-02-24 ENCOUNTER — Encounter: Payer: Self-pay | Admitting: Interventional Cardiology

## 2017-02-24 VITALS — BP 124/66 | HR 65 | Ht 66.0 in | Wt 154.0 lb

## 2017-02-24 VITALS — BP 146/64 | HR 65 | Ht 64.0 in | Wt 154.0 lb

## 2017-02-24 DIAGNOSIS — I482 Chronic atrial fibrillation, unspecified: Secondary | ICD-10-CM

## 2017-02-24 DIAGNOSIS — R05 Cough: Secondary | ICD-10-CM | POA: Diagnosis not present

## 2017-02-24 DIAGNOSIS — I5032 Chronic diastolic (congestive) heart failure: Secondary | ICD-10-CM | POA: Diagnosis not present

## 2017-02-24 DIAGNOSIS — K219 Gastro-esophageal reflux disease without esophagitis: Secondary | ICD-10-CM

## 2017-02-24 DIAGNOSIS — R059 Cough, unspecified: Secondary | ICD-10-CM

## 2017-02-24 DIAGNOSIS — I25709 Atherosclerosis of coronary artery bypass graft(s), unspecified, with unspecified angina pectoris: Secondary | ICD-10-CM

## 2017-02-24 DIAGNOSIS — J449 Chronic obstructive pulmonary disease, unspecified: Secondary | ICD-10-CM | POA: Diagnosis not present

## 2017-02-24 DIAGNOSIS — I679 Cerebrovascular disease, unspecified: Secondary | ICD-10-CM | POA: Diagnosis not present

## 2017-02-24 DIAGNOSIS — J300 Vasomotor rhinitis: Secondary | ICD-10-CM | POA: Diagnosis not present

## 2017-02-24 MED ORDER — IPRATROPIUM-ALBUTEROL 0.5-2.5 (3) MG/3ML IN SOLN: 3.0000 mL | Freq: Three times a day (TID) | RESPIRATORY_TRACT | 5 refills | Status: DC

## 2017-02-24 MED ORDER — FUROSEMIDE 40 MG PO TABS: 40.0000 mg | ORAL_TABLET | Freq: Every day | ORAL | 3 refills | Status: DC

## 2017-02-24 NOTE — Progress Notes (Signed)
Cardiology Office Note    Date:  02/24/2017   ID:  Amber, [name] [birth year], MRN [id number]  PCP:  Antony Contras, MD  Cardiologist: Sinclair Grooms, MD   Chief Complaint  Patient presents with  . Atrial Fibrillation  . Coronary Artery Disease    History of Present Illness:  Amber [name] is a 83 y.o. female with a history of coronary artery disease with prior CABG in 2000, chronic atrial fibrillation, chronic diastolic heart failure, chronic kidney disease, stage III, hyperlipidemia (statin intolerant), and essential hypertension with hypertensive heart disease. Also has cerebrovascular disease and dementia.    Recurrent hospital admissions with respiratory distress.  Diagnosed with combined chronic lung disease and diastolic heart failure.  Most recent hospitalization in January.  Her antibiotic therapy and steroids were used to break hypoxic respiratory failure.  Now home for approximately 2 weeks.  Saw pulmonology today and no changes in therapy were made.  She is on furosemide 40 mg daily rather than the 20 mg/day that that she was prescribed at discharge.  She denies chest pain.  No lower extremity swelling.  Denies lightheadedness and dizziness.   Past Medical History:  Diagnosis Date  . Abnormality of gait 08/02/2014  . Cerebrovascular disease 05/21/2016  . Cervical spinal stenosis   . Chronic atrial fibrillation (HCC)    a. chronic/rate controlled;  b. chronic coumadin.  . Chronic diastolic CHF (congestive heart failure) ([place])    a. 02/2013 Echo: EF 55-60%, mild AI, mod dil LA.  . CKD (chronic kidney disease), stage III ([place])   . COPD with emphysema ([place])    PFT 05/02/10>>FEV1 1.35(62%), FEV1% 66, DLCO 75%  . Coronary artery disease    a. s/p CABG;  b. Abnormal nuc 2015 - managed medically.  . CVA (cerebral vascular accident) ([place])    left sided weakness  . Dementia   . Depression   . Fibromyalgia   . GERD (gastroesophageal reflux disease)    pepcid 2-3 times  per week  . Gout   . Hemiparesis and alteration of sensations as late effects of stroke ([place]) 01/17/2015  . History of melanoma    squamous cell, melanoma  . Hyperlipidemia    a. statin intolerant, not felt to be candidate for PCSK9 due to chronically elevated CK levels.  . Hypertension   . Hypertensive heart disease   . Hypothyroidism   . Insomnia   . Intermittent confusion   . Memory change 01/23/2014  . Nasal polyposis   . Osteoarthritis   . Osteoporosis   . Pneumonia    1990  . Tobacco abuse     Past Surgical History:  Procedure Laterality Date  . ABDOMINAL HYSTERECTOMY    . BREAST LUMPECTOMY  1980s   Benign lesion - right  . carpel tunnel     right  . CORONARY ARTERY BYPASS GRAFT  2000  . EYE SURGERY     bilateral cataracts  . IR GENERIC HISTORICAL  03/31/2016   IR RADIOLOGIST EVAL & MGMT 03/31/2016 [facility]INTERV RAD  . RADIOLOGY WITH ANESTHESIA  12/24/2011   Procedure: RADIOLOGY WITH ANESTHESIA;  Surgeon: Medication Radiologist, MD;  Location: [facility];  Service: Radiology;  Laterality: N/A;  Extra Cranial Vascular Stent  . RADIOLOGY WITH ANESTHESIA N/A 07/19/2014   Procedure: ANGIOPLASTY;  Surgeon: Luanne Bras, MD;  Location: [facility];  Service: Radiology;  Laterality: N/A;  . RADIOLOGY WITH ANESTHESIA N/A 07/27/2014   Procedure: ANGIOPLASTY;  Surgeon: Luanne Bras, MD;  Location:  [facility];  Service: Radiology;  Laterality: N/A;  . TONSILLECTOMY      Current Medications: Outpatient Medications Prior to Visit  Medication Sig Dispense Refill  . allopurinol (ZYLOPRIM) 300 MG tablet Take 1 tablet (300 mg total) by mouth every other day. (Patient taking differently: Take 300 mg by mouth daily. ) 45 tablet 3  . amitriptyline (ELAVIL) 10 MG tablet TAKE 2 TABLETS BY MOUTH AT BEDTIME (Patient taking differently: TAKE 20mg   BY MOUTH AT BEDTIME) 180 tablet 3  . amLODipine (NORVASC) 5 MG tablet Take 1 tablet (5 mg total) by mouth at bedtime. 90 tablet 0  . benzonatate (TESSALON)  100 MG capsule Take 1 capsule (100 mg total) by mouth 3 (three) times daily. 20 capsule 0  . bisacodyl (DULCOLAX) 5 MG EC tablet Take 2 tablets (10 mg total) by mouth daily. 30 tablet 0  . celecoxib (CELEBREX) 200 MG capsule Take 200 mg by mouth every morning.     . citalopram (CELEXA) 20 MG tablet TAKE 1 TABLET BY MOUTH TWICE A DAY 60 tablet 11  . COUMADIN 5 MG tablet TAKE AS DIRECTED BY COUMADIN CLINIC. Hold coumadin November 24 th and November 25 th.  Recheck INR on Monday. (Patient taking differently: Take 5-7.5 mg by mouth daily. Take 5 mg everyday except 7.5 mg on Tuesday and Saturday) 135 tablet 1  . digoxin (LANOXIN) 0.125 MG tablet Take 1 tablet (0.125 mg total) by mouth on Mondays, Wednesdays, & Fridays 36 tablet 1  . donepezil (ARICEPT) 10 MG tablet Take 1 tablet (10 mg total) by mouth at bedtime. (Patient taking differently: Take 10 mg by mouth daily. ) 30 tablet 5  . ergocalciferol (VITAMIN D2) 50000 UNITS capsule Take 50,000 Units by mouth 2 (two) times a week. Tuesday and Friday    . famotidine (PEPCID) 20 MG tablet Take 1 tablet (20 mg total) by mouth daily.    . fluticasone (FLONASE) 50 MCG/ACT nasal spray Place 2 sprays into both nostrils 2 (two) times daily. 1 g 2  . guaiFENesin (MUCINEX) 600 MG 12 hr tablet Take 2 tablets (1,200 mg total) by mouth 2 (two) times daily.    [name] ipratropium (ATROVENT) 0.03 % nasal spray Place 2 sprays into the nose 3 (three) times daily. 30 mL 12  . ipratropium-albuterol (DUONEB) 0.5-2.5 (3) MG/3ML SOLN Take 3 mLs by nebulization 3 (three) times daily. 300 mL 5  . KLOR-CON M20 20 MEQ tablet Take 0.5 tablets (10 mEq total) by mouth daily. 30 tablet 0  . levothyroxine (SYNTHROID, LEVOTHROID) 25 MCG tablet Take 25 mcg by mouth every morning.     [name] losartan (COZAAR) 50 MG tablet Take 1 tablet (50 mg total) by mouth daily. 90 tablet 3  . memantine (NAMENDA XR) 28 MG CP24 24 hr capsule TAKE ONE CAPSULE BY MOUTH EVERY DAY 90 capsule 3  . metoprolol succinate  (TOPROL-XL) 25 MG 24 hr tablet TAKE 1 TABLET (25 MG TOTAL) BY MOUTH DAILY. (Patient taking differently: TAKE 1 TABLET (25 MG TOTAL) BY MOUTH DAILY. Morning) 90 tablet 2  . nitroGLYCERIN (NITROSTAT) 0.4 MG SL tablet Place 1 tablet (0.4 mg total) under the tongue every 5 (five) minutes as needed for chest pain. 25 tablet 3  . polyethylene glycol (MIRALAX / GLYCOLAX) packet Take 17 g by mouth daily. 14 each 0  . PROAIR HFA 108 (90 BASE) MCG/ACT inhaler INHALE 2 PUFFS INTO LUNGS EVERY 6 HOURS AS NEEDED FOR WHEEZING 8.5 each 0  . albuterol (PROVENTIL HFA;VENTOLIN  HFA) 108 (90 Base) MCG/ACT inhaler Inhale 2 puffs into the lungs every 4 (four) hours as needed for wheezing or shortness of breath. 1 Inhaler 0  . furosemide (LASIX) 40 MG tablet Take 0.5 tablets (20 mg total) by mouth daily. Start 1/10 30 tablet 0   No facility-administered medications prior to visit.      Allergies:   Ezetimibe; Welchol [colesevelam hcl]; Adhesive [tape]; Ceclor [cefaclor]; Elastic bandages & [zinc]; Latex; Lipitor [atorvastatin calcium]; Mevacor [lovastatin]; Pravachol; and Vasotec   Social History   Socioeconomic History  . Marital status: Married    Spouse name: None  . Number of children: 2  . Years of education: None  . Highest education level: None  Social Needs  . Financial resource strain: None  . Food insecurity - worry: None  . Food insecurity - inability: None  . Transportation needs - medical: None  . Transportation needs - non-medical: None  Occupational History  . 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 Occupation: Network engineer    Comment: Retired  Tobacco Use  . Smoking status: Former Smoker    Packs/day: 1.50    Years: 25.00    Pack years: 37.50    Types: Cigarettes    Last attempt to quit: 02/10/1977    Years since quitting: 40.0  . Smokeless tobacco: Never Used  Substance and Sexual Activity  . Alcohol use: No  . Drug use: No  . Sexual activity: None    Other Topics Concern  . None  Social History Narrative   Patient is right handed.   Patient lives at home with husband   Patient drinks 8 oz caffeine occasionally.     Family History:  The patient's family history includes CAD in her brother and father; Diabetes type II in her brother; Stroke in her mother.   ROS:   Please see the history of present illness.    Cough, wheezing, irregular heartbeat, excessive fatigue. All other systems reviewed and are negative.   PHYSICAL EXAM:   VS:  BP (!) 146/64   Pulse 65   Ht 5\' 4"  (1.626 m)   Wt 154 lb (69.9 kg)   BMI 26.43 kg/m    GEN: Well nourished, well developed, in no acute distress  HEENT: normal  Neck: no JVD, carotid bruits, or masses Cardiac: IIRR; no murmurs, rubs, or gallops,no edema  Respiratory:  clear to auscultation bilaterally, normal work of breathing GI: soft, nontender, nondistended, + BS MS: no deformity or atrophy  Skin: warm and dry, no rash Neuro:  Alert and Oriented x 3, Strength and sensation are intact Psych: euthymic mood, full affect  Wt Readings from Last 3 Encounters:  02/24/17 154 lb (69.9 kg)  02/24/17 154 lb (69.9 kg)  02/17/17 156 lb (70.8 kg)      Studies/Labs Reviewed:   EKG:  EKG February 11, 2017 tracing reveals atrial fibrillation with controlled rate and nonspecific T wave abnormality.  No acute changes were noted.  Recent Labs: 01/23/2017: NT-Pro BNP 2,015 02/06/2017: ALT 16 02/15/2017: B Natriuretic Peptide 184.2 02/16/2017: BUN 49; Creatinine, Ser 1.18; Hemoglobin 13.0; Platelets 253; Potassium 4.6; Sodium 135   Lipid Panel    Component Value Date/Time   CHOL 392 (H) 12/11/2014 1644   TRIG 373 (H) 12/11/2014 1644   HDL 44 (L) 12/11/2014 1644   CHOLHDL 8.9 (H) 12/11/2014 1644   VLDL 75 (H) 12/11/2014 1644   LDLCALC 273 (H) 12/11/2014 1644   LDLDIRECT 236.0 08/02/2014  1418    Additional studies/ records that were reviewed today include:  Echocardiogram performed on 02/09/17  demonstrates:  Study Conclusions   - Left ventricle: The cavity size was normal. Systolic function was   normal. The estimated ejection fraction was in the range of 60%   to 65%. Wall motion was normal; there were no regional wall   motion abnormalities. - Aortic valve: There was mild stenosis. Mean gradient (S): 9 mm   Hg. Valve area (VTI): 1.41 cm^2. Valve area (Vmax): 1.38 cm^2.   Valve area (Vmean): 1.25 cm^2. - Aortic root: The aortic root was normal in size. - Mitral valve: The findings are consistent with mild stenosis.   Mean gradient (D): 3 mm Hg. Valve area by pressure half-time:   1.68 cm^2. Valve area by continuity equation (using LVOT flow):   1.79 cm^2. - Left atrium: The atrium was moderately dilated. - Right ventricle: The cavity size was normal. Wall thickness was   normal. Systolic function was normal. - Right atrium: The atrium was normal in size. - Tricuspid valve: There was moderate regurgitation. - Pulmonary arteries: Systolic pressure was moderately increased.   PA peak pressure: 52 mm Hg (S). - Inferior vena cava: The vessel was dilated. The respirophasic   diameter changes were blunted (< 50%), consistent with elevated   central venous pressure. - Pericardium, extracardiac: There was no pericardial effusion.   ASSESSMENT:    1. Chronic diastolic heart failure ([place])   2. Coronary artery disease involving coronary bypass graft of native heart with angina pectoris ([place])   3. Chronic atrial fibrillation (HCC)   4. Cerebrovascular disease      PLAN:  In order of problems listed above:  1. Volume status is stable.  Continue furosemide 40 mg/day.  BNP and basic metabolic panel in 1 week.  Clinic follow-up in 6 weeks. 2. Asymptomatic with reference to ischemic/anginal complaints. 3. Chronic A. fib with controlled rate.   Continue current therapy as listed.  Monitor for development of dehydration.  I would recommend keeping her relatively dry so as not to  aggravate the respiratory status by allowing pulmonary congestion.    Medication Adjustments/Labs and Tests Ordered: Current medicines are reviewed at length with the patient today.  Concerns regarding medicines are outlined above.  Medication changes, Labs and Tests ordered today are listed in the Patient Instructions below. Patient Instructions  Medication Instructions:  1) TAKE Furosemide 40mg  once daily  Labwork: Your physician recommends that you return for lab work in: 1 week (BMET and Pro BNP)   Testing/Procedures: None  Follow-Up: Your physician recommends that you schedule a follow-up appointment in: 6 weeks with Truitt Merle, NP, Cecilie Kicks, NP or Dr. Tamala Julian.    Any Other Special Instructions Will Be Listed Below (If Applicable).     If you need a refill on your cardiac medications before your next appointment, please call your pharmacy.  s    Signed, Sinclair Grooms, MD  02/24/2017 2:57 PM    [facility] [address], [place], [place]  [address] Phone: [phone]; Fax: [phone]

## 2017-02-24 NOTE — Patient Instructions (Signed)
Medication Instructions:  1) TAKE Furosemide 40mg  once daily  Labwork: Your physician recommends that you return for lab work in: 1 week (BMET and Pro BNP)   Testing/Procedures: None  Follow-Up: Your physician recommends that you schedule a follow-up appointment in: 6 weeks with Truitt Merle, NP, Cecilie Kicks, NP or Dr. Tamala Julian.    Any Other Special Instructions Will Be Listed Below (If Applicable).     If you need a refill on your cardiac medications before your next appointment, please call your pharmacy.  s

## 2017-02-24 NOTE — Progress Notes (Signed)
Subjective:   PATIENT ID: Amber [name] GENDER: female DOB: [birth year], MRN: [id number]   Synopsis: Referred in January 2019  for COPD after she was hospitalized for cough and wheeze from rhinovirus.  She previously saw Dr. stood in 2012  HPI  Chief Complaint  Patient presents with  . Advice Only    Referred by Dr. Moreen Fowler for COPD.      Amber [name] tells me she would like for me to help her with her wheezing, productive cough and dyspnea.  Dyspnea: > became worse after a recent hospitalization for COPD > She had a cold after Christmas and things got worse and she ended up in the ICU > she was treated in the hospital for 12 days with steroids and bronchodilators > Her daughter provides most of the history and states that her dyspnea is a little better but she is still wheezing and coughing > she was prescribed a prednisone taper > was given duoneb in the hospital but she is not taking it now  Wheezing: > still a problem > nearly every day they hear frequent wheezing and cough > they have seen their PCP for this who ordered a CXR  Cough: > she is still frequently coughing as well > she is not really able to produce anything > she will sometimes have mucus in her throat, but doesn't cough anything out > she says the cough is worse at night compared to the daytime > SLP evaluation normal in hospital  COPD? > they used to see Dr. Halford Chessman here, but they have not been back in a few years > she had a back episode of pneumonia as an adult > she she smoked 1/2 pack per day for about 20 years > no childhood illnesses, unclear  Post nasal drip: > has been a problem for years > they have been using fluticasone and ipratropium nose spray  Past Medical History:  Diagnosis Date  . Abnormality of gait 08/02/2014  . Cerebrovascular disease 05/21/2016  . Cervical spinal stenosis   . Chronic atrial fibrillation (HCC)    a. chronic/rate controlled;  b. chronic coumadin.  . Chronic  diastolic CHF (congestive heart failure) ([place])    a. 02/2013 Echo: EF 55-60%, mild AI, mod dil LA.  . CKD (chronic kidney disease), stage III ([place])   . COPD with emphysema ([employer])    PFT 05/02/10>>FEV1 1.35(62%), FEV1% 66, DLCO 75%  . Coronary artery disease    a. s/p CABG;  b. Abnormal nuc 2015 - managed medically.  . CVA (cerebral vascular accident) ([place])    left sided weakness  . Dementia   . Depression   . Fibromyalgia   . GERD (gastroesophageal reflux disease)    pepcid 2-3 times per week  . Gout   . Hemiparesis and alteration of sensations as late effects of stroke ([place]) 01/17/2015  . History of melanoma    squamous cell, melanoma  . Hyperlipidemia    a. statin intolerant, not felt to be candidate for PCSK9 due to chronically elevated CK levels.  . Hypertension   . Hypertensive heart disease   . Hypothyroidism   . Insomnia   . Intermittent confusion   . Memory change 01/23/2014  . Nasal polyposis   . Osteoarthritis   . Osteoporosis   . Pneumonia    1990  . Tobacco abuse      Family History  Problem Relation Age of Onset  . Stroke Mother   . CAD Father   .  Diabetes type II Brother   . CAD Brother      Social History   Socioeconomic History  . Marital status: Married    Spouse name: Not on file  . Number of children: 2  . Years of education: Not on file  . Highest education level: Not on file  Social Needs  . Financial resource strain: Not on file  . Food insecurity - worry: Not on file  . Food insecurity - inability: Not on file  . Transportation needs - medical: Not on file  . Transportation needs - non-medical: Not on file  Occupational History  . 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 Occupation: Network engineer    Comment: Retired  Tobacco Use  . Smoking status: Former Smoker    Packs/day: 1.50    Years: 25.00    Pack years: 37.50    Types: Cigarettes    Last attempt to quit: 02/10/1977    Years since quitting: 40.0    . Smokeless tobacco: Never Used  Substance and Sexual Activity  . Alcohol use: No  . Drug use: No  . Sexual activity: Not on file  Other Topics Concern  . Not on file  Social History Narrative   Patient is right handed.   Patient lives at home with husband   Patient drinks 8 oz caffeine occasionally.     Allergies  Allergen Reactions  . Ezetimibe Other (See Comments)    Myalgia  . Welchol [Colesevelam Hcl] Other (See Comments)    Muscle aches  . Adhesive [Tape] Itching, Rash and Other (See Comments)    Burning   . Ceclor [Cefaclor] Other (See Comments)    Unknown allergic reaction  . Elastic Bandages & [Zinc] Rash and Other (See Comments)    Turns red on the areas it touches  . Latex Itching, Rash and Other (See Comments)    Burning   . Lipitor [Atorvastatin Calcium] Other (See Comments)    Increased fibromyalgia pain  . Mevacor [Lovastatin] Other (See Comments)    Increased fibromyalgia pain  . Pravachol Other (See Comments)    Increased fibromyalgia pain  . Vasotec Other (See Comments)    Unknown allergic reaction     Outpatient Medications Prior to Visit  Medication Sig Dispense Refill  . albuterol (PROVENTIL HFA;VENTOLIN HFA) 108 (90 Base) MCG/ACT inhaler Inhale 2 puffs into the lungs every 4 (four) hours as needed for wheezing or shortness of breath. 1 Inhaler 0  . allopurinol (ZYLOPRIM) 300 MG tablet Take 1 tablet (300 mg total) by mouth every other day. (Patient taking differently: Take 300 mg by mouth daily. ) 45 tablet 3  . amitriptyline (ELAVIL) 10 MG tablet TAKE 2 TABLETS BY MOUTH AT BEDTIME (Patient taking differently: TAKE 20mg   BY MOUTH AT BEDTIME) 180 tablet 3  . amLODipine (NORVASC) 5 MG tablet Take 1 tablet (5 mg total) by mouth at bedtime. 90 tablet 0  . benzonatate (TESSALON) 100 MG capsule Take 1 capsule (100 mg total) by mouth 3 (three) times daily. 20 capsule 0  . bisacodyl (DULCOLAX) 5 MG EC tablet Take 2 tablets (10 mg total) by mouth daily. 30  tablet 0  . celecoxib (CELEBREX) 200 MG capsule Take 200 mg by mouth every morning.     . citalopram (CELEXA) 20 MG tablet TAKE 1 TABLET BY MOUTH TWICE A DAY 60 tablet 11  . COUMADIN 5 MG tablet TAKE AS DIRECTED BY COUMADIN CLINIC. Hold coumadin November 24 th  and November 25 th.  Recheck INR on Monday. (Patient taking differently: Take 5-7.5 mg by mouth daily. Take 5 mg everyday except 7.5 mg on Tuesday and Saturday) 135 tablet 1  . digoxin (LANOXIN) 0.125 MG tablet Take 1 tablet (0.125 mg total) by mouth on Mondays, Wednesdays, & Fridays 36 tablet 1  . donepezil (ARICEPT) 10 MG tablet Take 1 tablet (10 mg total) by mouth at bedtime. (Patient taking differently: Take 10 mg by mouth daily. ) 30 tablet 5  . ergocalciferol (VITAMIN D2) 50000 UNITS capsule Take 50,000 Units by mouth 2 (two) times a week. Tuesday and Friday    . famotidine (PEPCID) 20 MG tablet Take 1 tablet (20 mg total) by mouth daily.    . fluticasone (FLONASE) 50 MCG/ACT nasal spray Place 2 sprays into both nostrils 2 (two) times daily. 1 g 2  . furosemide (LASIX) 40 MG tablet Take 0.5 tablets (20 mg total) by mouth daily. Start 1/10 30 tablet 0  . guaiFENesin (MUCINEX) 600 MG 12 hr tablet Take 2 tablets (1,200 mg total) by mouth 2 (two) times daily.    [name] ipratropium (ATROVENT) 0.03 % nasal spray Place 2 sprays into the nose 3 (three) times daily. 30 mL 12  . KLOR-CON M20 20 MEQ tablet Take 0.5 tablets (10 mEq total) by mouth daily. 30 tablet 0  . levothyroxine (SYNTHROID, LEVOTHROID) 25 MCG tablet Take 25 mcg by mouth every morning.     [name] losartan (COZAAR) 50 MG tablet Take 1 tablet (50 mg total) by mouth daily. 90 tablet 3  . memantine (NAMENDA XR) 28 MG CP24 24 hr capsule TAKE ONE CAPSULE BY MOUTH EVERY DAY 90 capsule 3  . metoprolol succinate (TOPROL-XL) 25 MG 24 hr tablet TAKE 1 TABLET (25 MG TOTAL) BY MOUTH DAILY. (Patient taking differently: TAKE 1 TABLET (25 MG TOTAL) BY MOUTH DAILY. Morning) 90 tablet 2  . nitroGLYCERIN  (NITROSTAT) 0.4 MG SL tablet Place 1 tablet (0.4 mg total) under the tongue every 5 (five) minutes as needed for chest pain. 25 tablet 3  . polyethylene glycol (MIRALAX / GLYCOLAX) packet Take 17 g by mouth daily. 14 each 0  . PROAIR HFA 108 (90 BASE) MCG/ACT inhaler INHALE 2 PUFFS INTO LUNGS EVERY 6 HOURS AS NEEDED FOR WHEEZING 8.5 each 0  . predniSONE (DELTASONE) 10 MG tablet Take 2 pills on 1/9.[name]1 pill 1/10...half pill 1/11 and 1/12.Marland Kitchenthen stop 4 tablet 0   No facility-administered medications prior to visit.     Review of Systems  Constitutional: Negative for fever and weight loss.  HENT: Negative for congestion, ear pain, nosebleeds and sore throat.   Eyes: Negative for redness.  Respiratory: Positive for cough, shortness of breath and wheezing.   Cardiovascular: Negative for palpitations, leg swelling and PND.  Gastrointestinal: Negative for nausea and vomiting.  Genitourinary: Negative for dysuria.  Skin: Negative for rash.  Neurological: Negative for headaches.  Endo/Heme/Allergies: Does not bruise/bleed easily.  Psychiatric/Behavioral: Negative for depression. The patient is not nervous/anxious.       Objective:  Physical Exam   Vitals:   02/24/17 0942  BP: 124/66  Pulse: 65  SpO2: 98%  Weight: 154 lb (69.9 kg)  Height: 5\' 6"  (1.676 m)    Gen: chronically ill appearing, no acute distress HENT: NCAT, OP clear, neck supple without masses Eyes: PERRL, EOMi Lymph: no cervical lymphadenopathy PULM: Mild upper airway inspiratory wheeze, slight lower airway expiratory wheeze, normal air movement CV: Irreg irreg, no mgr, no JVD GI:  BS+, soft, nontender, no hsm Derm: no rash or skin breakdown MSK: normal bulk and tone Neuro: A&Ox4, CN II-XII intact, strength 5/5 in all 4 extremities Psyche: normal mood and affect   CBC    Component Value Date/Time   WBC 11.0 (H) 02/16/2017 0445   RBC 4.48 02/16/2017 0445   HGB 13.0 02/16/2017 0445   HCT 39.8 02/16/2017 0445    PLT 253 02/16/2017 0445   MCV 88.8 02/16/2017 0445   MCH 29.0 02/16/2017 0445   MCHC 32.7 02/16/2017 0445   RDW 15.5 02/16/2017 0445   LYMPHSABS 0.9 02/06/2017 0652   MONOABS 1.0 02/06/2017 0652   EOSABS 0.0 02/06/2017 0652   BASOSABS 0.0 02/06/2017 0652     Chest imaging: January 2019 chest x-ray images independently reviewed showing sternal wires, slightly enlarged cardiac silhouette, emphysema  PFT: 2012 spirometry ratio 66%, FEV1 1.35 L 62% predicted, FVC 2.05 L 71% predicted, DLCO 18.40 mL 75 %/day 2014 spirometry reviewed not acceptable by ATS [place] [facility]  Labs:  Path:  Echo:  Heart Catheterization:  Other: 02/2017 Modified barium swallow> normal, rec regular diet  Hospitalization records from January 2019 reviewed where she was treated for COPD exacerbation     Assessment & Plan:   Chronic obstructive pulmonary disease, unspecified COPD type ([place]) - Plan: Ambulatory Referral for DME  Gastroesophageal reflux disease, esophagitis presence not specified  Vasomotor rhinitis  Cough  Discussion: Ms. Fetterly has COPD, a 2012 lung function test showed moderate airflow obstruction.  However, her current cough and persistent wheeze I think are due to both upper airway pathology (laryngeal irritation exacerbated by postnasal drip, persistent cough, and acid reflux) as well as perhaps some mild lower airway wheezing from COPD.  At this point I do not think she would benefit from further prednisone.  However, she does need to be on scheduled bronchodilators.  I think she will get the most benefit for treatment for her upper airway cough syndrome.  Specifically I think she needs to be on a higher dose of Pepcid, have better control for postnasal drip, and she needs to focus on voice rest.  Plan: For vasomotor rhinitis: Stop fluticasone nasal spray Continue ipratropium nasal spray you can use this 3-4 times a day as needed for postnasal drip  For COPD: I would like to get a lung  function test on the next visit but will hold off for now as you are still coughing Take DuoNeb 3 times a day via nebulizer  For gastroesophageal reflux disease: Increase Pepcid to twice a day  For cough: You need to try to suppress your cough to allow your larynx (voice box) to heal.  For three days don't talk, laugh, sing, or clear your throat. Do everything you can to suppress the cough during this time. Use hard candies (sugarless Jolly Ranchers) or non-mint or non-menthol containing cough drops during this time to soothe your throat.  Use a cough suppressant (Delsym or what I have prescribed you) around the clock during this time.  After three days, gradually increase the use of your voice and back off on the cough suppressants.  If it any point you get increasing cough, shortness of breath, or fever or chills let us know right away.  We will see you back in 1-2 weeks with a nurse practitioner    Current Outpatient Medications:  .  albuterol (PROVENTIL HFA;VENTOLIN HFA) 108 (90 Base) MCG/ACT inhaler, Inhale 2 puffs into the lungs every 4 (four) hours as needed for wheezing  or shortness of breath., Disp: 1 Inhaler, Rfl: 0 .  allopurinol (ZYLOPRIM) 300 MG tablet, Take 1 tablet (300 mg total) by mouth every other day. (Patient taking differently: Take 300 mg by mouth daily. ), Disp: 45 tablet, Rfl: 3 .  amitriptyline (ELAVIL) 10 MG tablet, TAKE 2 TABLETS BY MOUTH AT BEDTIME (Patient taking differently: TAKE 20mg   BY MOUTH AT BEDTIME), Disp: 180 tablet, Rfl: 3 .  amLODipine (NORVASC) 5 MG tablet, Take 1 tablet (5 mg total) by mouth at bedtime., Disp: 90 tablet, Rfl: 0 .  benzonatate (TESSALON) 100 MG capsule, Take 1 capsule (100 mg total) by mouth 3 (three) times daily., Disp: 20 capsule, Rfl: 0 .  bisacodyl (DULCOLAX) 5 MG EC tablet, Take 2 tablets (10 mg total) by mouth daily., Disp: 30 tablet, Rfl: 0 .  celecoxib (CELEBREX) 200 MG capsule, Take 200 mg by mouth every morning. , Disp: , Rfl:   .  citalopram (CELEXA) 20 MG tablet, TAKE 1 TABLET BY MOUTH TWICE A DAY, Disp: 60 tablet, Rfl: 11 .  COUMADIN 5 MG tablet, TAKE AS DIRECTED BY COUMADIN CLINIC. Hold coumadin November 24 th and November 25 th.  Recheck INR on Monday. (Patient taking differently: Take 5-7.5 mg by mouth daily. Take 5 mg everyday except 7.5 mg on Tuesday and Saturday), Disp: 135 tablet, Rfl: 1 .  digoxin (LANOXIN) 0.125 MG tablet, Take 1 tablet (0.125 mg total) by mouth on Mondays, Wednesdays, & Fridays, Disp: 36 tablet, Rfl: 1 .  donepezil (ARICEPT) 10 MG tablet, Take 1 tablet (10 mg total) by mouth at bedtime. (Patient taking differently: Take 10 mg by mouth daily. ), Disp: 30 tablet, Rfl: 5 .  ergocalciferol (VITAMIN D2) 50000 UNITS capsule, Take 50,000 Units by mouth 2 (two) times a week. Tuesday and Friday, Disp: , Rfl:  .  famotidine (PEPCID) 20 MG tablet, Take 1 tablet (20 mg total) by mouth daily., Disp: , Rfl:  .  fluticasone (FLONASE) 50 MCG/ACT nasal spray, Place 2 sprays into both nostrils 2 (two) times daily., Disp: 1 g, Rfl: 2 .  furosemide (LASIX) 40 MG tablet, Take 0.5 tablets (20 mg total) by mouth daily. Start 1/10, Disp: 30 tablet, Rfl: 0 .  guaiFENesin (MUCINEX) 600 MG 12 hr tablet, Take 2 tablets (1,200 mg total) by mouth 2 (two) times daily., Disp: , Rfl:  .  ipratropium (ATROVENT) 0.03 % nasal spray, Place 2 sprays into the nose 3 (three) times daily., Disp: 30 mL, Rfl: 12 .  KLOR-CON M20 20 MEQ tablet, Take 0.5 tablets (10 mEq total) by mouth daily., Disp: 30 tablet, Rfl: 0 .  levothyroxine (SYNTHROID, LEVOTHROID) 25 MCG tablet, Take 25 mcg by mouth every morning. , Disp: , Rfl:  .  losartan (COZAAR) 50 MG tablet, Take 1 tablet (50 mg total) by mouth daily., Disp: 90 tablet, Rfl: 3 .  memantine (NAMENDA XR) 28 MG CP24 24 hr capsule, TAKE ONE CAPSULE BY MOUTH EVERY DAY, Disp: 90 capsule, Rfl: 3 .  metoprolol succinate (TOPROL-XL) 25 MG 24 hr tablet, TAKE 1 TABLET (25 MG TOTAL) BY MOUTH DAILY.  (Patient taking differently: TAKE 1 TABLET (25 MG TOTAL) BY MOUTH DAILY. Morning), Disp: 90 tablet, Rfl: 2 .  nitroGLYCERIN (NITROSTAT) 0.4 MG SL tablet, Place 1 tablet (0.4 mg total) under the tongue every 5 (five) minutes as needed for chest pain., Disp: 25 tablet, Rfl: 3 .  polyethylene glycol (MIRALAX / GLYCOLAX) packet, Take 17 g by mouth daily., Disp: 14 each, Rfl: 0 .  PROAIR HFA 108 (90 BASE) MCG/ACT inhaler, INHALE 2 PUFFS INTO LUNGS EVERY 6 HOURS AS NEEDED FOR WHEEZING, Disp: 8.5 each, Rfl: 0 .  ipratropium-albuterol (DUONEB) 0.5-2.5 (3) MG/3ML SOLN, Take 3 mLs by nebulization 3 (three) times daily., Disp: 300 mL, Rfl: 5

## 2017-02-24 NOTE — Patient Instructions (Signed)
For vasomotor rhinitis: Stop fluticasone nasal spray Continue ipratropium nasal spray you can use this 3-4 times a day as needed for postnasal drip  For COPD: I would like to get a lung function test on the next visit but will hold off for now as you are still coughing Take DuoNeb 3 times a day via nebulizer  For gastroesophageal reflux disease: Increase Pepcid to twice a day  For cough: You need to try to suppress your cough to allow your larynx (voice box) to heal.  For three days don't talk, laugh, sing, or clear your throat. Do everything you can to suppress the cough during this time. Use hard candies (sugarless Jolly Ranchers) or non-mint or non-menthol containing cough drops during this time to soothe your throat.  Use a cough suppressant (Delsym or what I have prescribed you) around the clock during this time.  After three days, gradually increase the use of your voice and back off on the cough suppressants.  If it any point you get increasing cough, shortness of breath, or fever or chills let us know right away.  We will see you back in 1-2 weeks with a nurse practitioner

## 2017-02-27 ENCOUNTER — Ambulatory Visit (INDEPENDENT_AMBULATORY_CARE_PROVIDER_SITE_OTHER): Payer: Medicare Other | Admitting: Internal Medicine

## 2017-02-27 DIAGNOSIS — Z5181 Encounter for therapeutic drug level monitoring: Secondary | ICD-10-CM | POA: Diagnosis not present

## 2017-02-27 DIAGNOSIS — I482 Chronic atrial fibrillation, unspecified: Secondary | ICD-10-CM

## 2017-02-27 LAB — POCT INR: INR: 3.7

## 2017-02-27 NOTE — Patient Instructions (Signed)
Description   Spoke with Kathlee Nations [facility] RN instructed to have pt skip today's dose, then  continue taking 1 tablet daily except 1.5 tablets on Tuesdays and Saturdays. Continue dark leafy greens servings 2 times a week.  Recheck INR in 1 week.  Call with any questions or problems [phone]

## 2017-03-04 ENCOUNTER — Emergency Department (HOSPITAL_COMMUNITY): Payer: Medicare Other

## 2017-03-04 ENCOUNTER — Telehealth: Payer: Self-pay | Admitting: Pulmonary Disease

## 2017-03-04 ENCOUNTER — Telehealth: Payer: Self-pay | Admitting: Internal Medicine

## 2017-03-04 ENCOUNTER — Inpatient Hospital Stay (HOSPITAL_COMMUNITY)
Admission: EM | Admit: 2017-03-04 | Discharge: 2017-03-07 | DRG: 291 | Disposition: A | Discharge status: Home Health | Payer: Medicare Other | Attending: Family Medicine | Admitting: Family Medicine

## 2017-03-04 ENCOUNTER — Other Ambulatory Visit: Payer: Self-pay

## 2017-03-04 ENCOUNTER — Other Ambulatory Visit: Payer: Medicare Other

## 2017-03-04 ENCOUNTER — Encounter (HOSPITAL_COMMUNITY): Payer: Self-pay | Admitting: Internal Medicine

## 2017-03-04 ENCOUNTER — Telehealth: Payer: Self-pay | Admitting: Acute Care

## 2017-03-04 DIAGNOSIS — I5033 Acute on chronic diastolic (congestive) heart failure: Secondary | ICD-10-CM | POA: Diagnosis present

## 2017-03-04 DIAGNOSIS — I130 Hypertensive heart and chronic kidney disease with heart failure and stage 1 through stage 4 chronic kidney disease, or unspecified chronic kidney disease: Secondary | ICD-10-CM | POA: Diagnosis not present

## 2017-03-04 DIAGNOSIS — W19XXXA Unspecified fall, initial encounter: Secondary | ICD-10-CM | POA: Diagnosis present

## 2017-03-04 DIAGNOSIS — E039 Hypothyroidism, unspecified: Secondary | ICD-10-CM | POA: Diagnosis present

## 2017-03-04 DIAGNOSIS — M797 Fibromyalgia: Secondary | ICD-10-CM | POA: Diagnosis present

## 2017-03-04 DIAGNOSIS — I69354 Hemiplegia and hemiparesis following cerebral infarction affecting left non-dominant side: Secondary | ICD-10-CM

## 2017-03-04 DIAGNOSIS — D631 Anemia in chronic kidney disease: Secondary | ICD-10-CM | POA: Diagnosis present

## 2017-03-04 DIAGNOSIS — Z79899 Other long term (current) drug therapy: Secondary | ICD-10-CM

## 2017-03-04 DIAGNOSIS — F0390 Unspecified dementia without behavioral disturbance: Secondary | ICD-10-CM | POA: Diagnosis present

## 2017-03-04 DIAGNOSIS — I5031 Acute diastolic (congestive) heart failure: Secondary | ICD-10-CM | POA: Diagnosis not present

## 2017-03-04 DIAGNOSIS — N390 Urinary tract infection, site not specified: Secondary | ICD-10-CM | POA: Diagnosis present

## 2017-03-04 DIAGNOSIS — J810 Acute pulmonary edema: Secondary | ICD-10-CM

## 2017-03-04 DIAGNOSIS — Z7901 Long term (current) use of anticoagulants: Secondary | ICD-10-CM | POA: Diagnosis not present

## 2017-03-04 DIAGNOSIS — J9601 Acute respiratory failure with hypoxia: Secondary | ICD-10-CM | POA: Diagnosis present

## 2017-03-04 DIAGNOSIS — J449 Chronic obstructive pulmonary disease, unspecified: Secondary | ICD-10-CM

## 2017-03-04 DIAGNOSIS — J440 Chronic obstructive pulmonary disease with acute lower respiratory infection: Secondary | ICD-10-CM | POA: Diagnosis present

## 2017-03-04 DIAGNOSIS — B957 Other staphylococcus as the cause of diseases classified elsewhere: Secondary | ICD-10-CM | POA: Diagnosis present

## 2017-03-04 DIAGNOSIS — B9710 Unspecified enterovirus as the cause of diseases classified elsewhere: Secondary | ICD-10-CM | POA: Diagnosis present

## 2017-03-04 DIAGNOSIS — Z91040 Latex allergy status: Secondary | ICD-10-CM

## 2017-03-04 DIAGNOSIS — Z7189 Other specified counseling: Secondary | ICD-10-CM | POA: Diagnosis not present

## 2017-03-04 DIAGNOSIS — I69398 Other sequelae of cerebral infarction: Secondary | ICD-10-CM

## 2017-03-04 DIAGNOSIS — R402413 Glasgow coma scale score 13-15, at hospital admission: Secondary | ICD-10-CM | POA: Diagnosis present

## 2017-03-04 DIAGNOSIS — I69359 Hemiplegia and hemiparesis following cerebral infarction affecting unspecified side: Secondary | ICD-10-CM

## 2017-03-04 DIAGNOSIS — M810 Age-related osteoporosis without current pathological fracture: Secondary | ICD-10-CM | POA: Diagnosis present

## 2017-03-04 DIAGNOSIS — I5023 Acute on chronic systolic (congestive) heart failure: Secondary | ICD-10-CM

## 2017-03-04 DIAGNOSIS — I272 Pulmonary hypertension, unspecified: Secondary | ICD-10-CM | POA: Diagnosis present

## 2017-03-04 DIAGNOSIS — M1990 Unspecified osteoarthritis, unspecified site: Secondary | ICD-10-CM | POA: Diagnosis present

## 2017-03-04 DIAGNOSIS — R269 Unspecified abnormalities of gait and mobility: Secondary | ICD-10-CM | POA: Diagnosis present

## 2017-03-04 DIAGNOSIS — Z87891 Personal history of nicotine dependence: Secondary | ICD-10-CM

## 2017-03-04 DIAGNOSIS — N183 Chronic kidney disease, stage 3 (moderate): Secondary | ICD-10-CM | POA: Diagnosis present

## 2017-03-04 DIAGNOSIS — Z91048 Other nonmedicinal substance allergy status: Secondary | ICD-10-CM

## 2017-03-04 DIAGNOSIS — I482 Chronic atrial fibrillation: Secondary | ICD-10-CM | POA: Diagnosis present

## 2017-03-04 DIAGNOSIS — J441 Chronic obstructive pulmonary disease with (acute) exacerbation: Secondary | ICD-10-CM | POA: Diagnosis present

## 2017-03-04 DIAGNOSIS — Z515 Encounter for palliative care: Secondary | ICD-10-CM

## 2017-03-04 DIAGNOSIS — G4700 Insomnia, unspecified: Secondary | ICD-10-CM | POA: Diagnosis present

## 2017-03-04 DIAGNOSIS — Z66 Do not resuscitate: Secondary | ICD-10-CM | POA: Diagnosis present

## 2017-03-04 DIAGNOSIS — F329 Major depressive disorder, single episode, unspecified: Secondary | ICD-10-CM | POA: Diagnosis present

## 2017-03-04 DIAGNOSIS — X58XXXA Exposure to other specified factors, initial encounter: Secondary | ICD-10-CM | POA: Diagnosis present

## 2017-03-04 DIAGNOSIS — R7989 Other specified abnormal findings of blood chemistry: Secondary | ICD-10-CM | POA: Diagnosis present

## 2017-03-04 DIAGNOSIS — Z888 Allergy status to other drugs, medicaments and biological substances status: Secondary | ICD-10-CM

## 2017-03-04 DIAGNOSIS — M4802 Spinal stenosis, cervical region: Secondary | ICD-10-CM | POA: Diagnosis present

## 2017-03-04 DIAGNOSIS — Z7951 Long term (current) use of inhaled steroids: Secondary | ICD-10-CM

## 2017-03-04 DIAGNOSIS — Z881 Allergy status to other antibiotic agents status: Secondary | ICD-10-CM

## 2017-03-04 DIAGNOSIS — Z951 Presence of aortocoronary bypass graft: Secondary | ICD-10-CM | POA: Diagnosis not present

## 2017-03-04 DIAGNOSIS — R0902 Hypoxemia: Secondary | ICD-10-CM

## 2017-03-04 DIAGNOSIS — I509 Heart failure, unspecified: Secondary | ICD-10-CM | POA: Insufficient documentation

## 2017-03-04 DIAGNOSIS — I2510 Atherosclerotic heart disease of native coronary artery without angina pectoris: Secondary | ICD-10-CM | POA: Diagnosis present

## 2017-03-04 DIAGNOSIS — J209 Acute bronchitis, unspecified: Secondary | ICD-10-CM | POA: Diagnosis present

## 2017-03-04 DIAGNOSIS — B9789 Other viral agents as the cause of diseases classified elsewhere: Secondary | ICD-10-CM | POA: Diagnosis present

## 2017-03-04 DIAGNOSIS — M109 Gout, unspecified: Secondary | ICD-10-CM | POA: Diagnosis present

## 2017-03-04 DIAGNOSIS — I4891 Unspecified atrial fibrillation: Secondary | ICD-10-CM | POA: Diagnosis present

## 2017-03-04 DIAGNOSIS — K219 Gastro-esophageal reflux disease without esophagitis: Secondary | ICD-10-CM | POA: Diagnosis present

## 2017-03-04 DIAGNOSIS — Z7902 Long term (current) use of antithrombotics/antiplatelets: Secondary | ICD-10-CM

## 2017-03-04 DIAGNOSIS — J4489 Other specified chronic obstructive pulmonary disease: Secondary | ICD-10-CM

## 2017-03-04 DIAGNOSIS — R209 Unspecified disturbances of skin sensation: Secondary | ICD-10-CM | POA: Diagnosis present

## 2017-03-04 DIAGNOSIS — Z791 Long term (current) use of non-steroidal anti-inflammatories (NSAID): Secondary | ICD-10-CM

## 2017-03-04 LAB — URINALYSIS, ROUTINE W REFLEX MICROSCOPIC
BACTERIA UA: NONE SEEN
BILIRUBIN URINE: NEGATIVE
Glucose, UA: NEGATIVE mg/dL
Ketones, ur: NEGATIVE mg/dL
Nitrite: NEGATIVE
PROTEIN: NEGATIVE mg/dL
SPECIFIC GRAVITY, URINE: 1.008 (ref 1.005–1.030)
pH: 5 (ref 5.0–8.0)

## 2017-03-04 LAB — TROPONIN I: Troponin I: 0.05 ng/mL (ref <0.03)

## 2017-03-04 LAB — COMPREHENSIVE METABOLIC PANEL
ALT: 19 U/L (ref 14–54)
AST: 20 U/L (ref 15–41)
Albumin: 3.2 g/dL — ABNORMAL LOW (ref 3.5–5.0)
Alkaline Phosphatase: 60 U/L (ref 38–126)
Anion gap: 8 (ref 5–15)
BUN: 14 mg/dL (ref 6–20)
CO2: 25 mmol/L (ref 22–32)
Calcium: 8.2 mg/dL — ABNORMAL LOW (ref 8.9–10.3)
Chloride: 104 mmol/L (ref 101–111)
Creatinine, Ser: 0.75 mg/dL (ref 0.44–1.00)
Glucose, Bld: 106 mg/dL — ABNORMAL HIGH (ref 65–99)
POTASSIUM: 3.6 mmol/L (ref 3.5–5.1)
SODIUM: 137 mmol/L (ref 135–145)
Total Bilirubin: 1.4 mg/dL — ABNORMAL HIGH (ref 0.3–1.2)
Total Protein: 6.2 g/dL — ABNORMAL LOW (ref 6.5–8.1)

## 2017-03-04 LAB — CBC WITH DIFFERENTIAL/PLATELET
Basophils Absolute: 0 10*3/uL (ref 0.0–0.1)
Basophils Relative: 0 %
Eosinophils Absolute: 0.1 10*3/uL (ref 0.0–0.7)
Eosinophils Relative: 1 %
HCT: 32.3 % — ABNORMAL LOW (ref 36.0–46.0)
HEMOGLOBIN: 10.4 g/dL — AB (ref 12.0–15.0)
LYMPHS PCT: 14 %
Lymphs Abs: 0.8 10*3/uL (ref 0.7–4.0)
MCH: 29.1 pg (ref 26.0–34.0)
MCHC: 32.2 g/dL (ref 30.0–36.0)
MCV: 90.5 fL (ref 78.0–100.0)
Monocytes Absolute: 0.3 10*3/uL (ref 0.1–1.0)
Monocytes Relative: 6 %
NEUTROS PCT: 79 %
Neutro Abs: 4.5 10*3/uL (ref 1.7–7.7)
Platelets: 162 10*3/uL (ref 150–400)
RBC: 3.57 MIL/uL — AB (ref 3.87–5.11)
RDW: 16.4 % — ABNORMAL HIGH (ref 11.5–15.5)
WBC: 5.7 10*3/uL (ref 4.0–10.5)

## 2017-03-04 LAB — PROTIME-INR
INR: 2.45
PROTHROMBIN TIME: 26.4 s — AB (ref 11.4–15.2)

## 2017-03-04 LAB — BRAIN NATRIURETIC PEPTIDE: B Natriuretic Peptide: 358.9 pg/mL — ABNORMAL HIGH (ref 0.0–100.0)

## 2017-03-04 MED ORDER — ALLOPURINOL 300 MG PO TABS
300.0000 mg | ORAL_TABLET | Freq: Every day | ORAL | Status: DC
  Administered 2017-03-05 – 2017-03-07 (×3): 300 mg via ORAL
  Filled 2017-03-04 (×3): qty 1

## 2017-03-04 MED ORDER — LOSARTAN POTASSIUM 50 MG PO TABS: 50.0000 mg | ORAL_TABLET | Freq: Every day | ORAL | Status: DC

## 2017-03-04 MED ORDER — FUROSEMIDE 10 MG/ML IJ SOLN
40.0000 mg | Freq: Once | INTRAMUSCULAR | Status: AC
  Administered 2017-03-04: 40 mg via INTRAVENOUS
  Filled 2017-03-04: qty 4

## 2017-03-04 MED ORDER — MEMANTINE HCL ER 28 MG PO CP24
28.0000 mg | ORAL_CAPSULE | Freq: Every day | ORAL | Status: DC
  Administered 2017-03-05 – 2017-03-07 (×3): 28 mg via ORAL
  Filled 2017-03-04 (×3): qty 1

## 2017-03-04 MED ORDER — FAMOTIDINE 20 MG PO TABS
20.0000 mg | ORAL_TABLET | Freq: Every day | ORAL | Status: DC
  Administered 2017-03-05 – 2017-03-07 (×3): 20 mg via ORAL
  Filled 2017-03-04 (×3): qty 1

## 2017-03-04 MED ORDER — METOPROLOL SUCCINATE ER 25 MG PO TB24
25.0000 mg | ORAL_TABLET | Freq: Every day | ORAL | Status: DC
  Administered 2017-03-05 – 2017-03-07 (×3): 25 mg via ORAL
  Filled 2017-03-04 (×3): qty 1

## 2017-03-04 MED ORDER — IPRATROPIUM BROMIDE 0.03 % NA SOLN
2.0000 | Freq: Three times a day (TID) | NASAL | Status: DC
  Administered 2017-03-05 – 2017-03-07 (×7): 2 via NASAL
  Filled 2017-03-04: qty 30

## 2017-03-04 MED ORDER — BENZONATATE 100 MG PO CAPS
100.0000 mg | ORAL_CAPSULE | Freq: Three times a day (TID) | ORAL | Status: DC
  Administered 2017-03-05 – 2017-03-07 (×7): 100 mg via ORAL
  Filled 2017-03-04 (×7): qty 1

## 2017-03-04 MED ORDER — BISACODYL 5 MG PO TBEC
10.0000 mg | DELAYED_RELEASE_TABLET | Freq: Every day | ORAL | Status: DC
  Administered 2017-03-05 – 2017-03-07 (×3): 10 mg via ORAL
  Filled 2017-03-04 (×3): qty 2

## 2017-03-04 MED ORDER — ACETAMINOPHEN 325 MG PO TABS: 650.0000 mg | ORAL_TABLET | Freq: Four times a day (QID) | ORAL | Status: DC | PRN

## 2017-03-04 MED ORDER — ALBUTEROL SULFATE (2.5 MG/3ML) 0.083% IN NEBU: 2.5000 mg | INHALATION_SOLUTION | RESPIRATORY_TRACT | Status: DC | PRN

## 2017-03-04 MED ORDER — LEVOTHYROXINE SODIUM 25 MCG PO TABS
25.0000 ug | ORAL_TABLET | Freq: Every day | ORAL | Status: DC
  Administered 2017-03-05 – 2017-03-07 (×3): 25 ug via ORAL
  Filled 2017-03-04 (×3): qty 1

## 2017-03-04 MED ORDER — IPRATROPIUM-ALBUTEROL 0.5-2.5 (3) MG/3ML IN SOLN
3.0000 mL | Freq: Three times a day (TID) | RESPIRATORY_TRACT | Status: DC
  Administered 2017-03-05 (×2): 3 mL via RESPIRATORY_TRACT
  Filled 2017-03-04 (×2): qty 3

## 2017-03-04 MED ORDER — CITALOPRAM HYDROBROMIDE 20 MG PO TABS
20.0000 mg | ORAL_TABLET | Freq: Two times a day (BID) | ORAL | Status: DC
  Administered 2017-03-05 – 2017-03-07 (×6): 20 mg via ORAL
  Filled 2017-03-04 (×6): qty 1

## 2017-03-04 MED ORDER — WARFARIN SODIUM 5 MG PO TABS
5.0000 mg | ORAL_TABLET | Freq: Once | ORAL | Status: AC
  Administered 2017-03-05: 5 mg via ORAL
  Filled 2017-03-04: qty 1

## 2017-03-04 MED ORDER — IPRATROPIUM BROMIDE 0.02 % IN SOLN
0.5000 mg | Freq: Once | RESPIRATORY_TRACT | Status: AC
  Administered 2017-03-04: 0.5 mg via RESPIRATORY_TRACT
  Filled 2017-03-04: qty 2.5

## 2017-03-04 MED ORDER — ALBUTEROL SULFATE (2.5 MG/3ML) 0.083% IN NEBU
5.0000 mg | INHALATION_SOLUTION | Freq: Once | RESPIRATORY_TRACT | Status: AC
  Administered 2017-03-04: 5 mg via RESPIRATORY_TRACT
  Filled 2017-03-04: qty 6

## 2017-03-04 MED ORDER — NITROGLYCERIN 0.4 MG SL SUBL: 0.4000 mg | SUBLINGUAL_TABLET | SUBLINGUAL | Status: DC | PRN

## 2017-03-04 MED ORDER — AMLODIPINE BESYLATE 5 MG PO TABS
5.0000 mg | ORAL_TABLET | Freq: Every day | ORAL | Status: DC
  Administered 2017-03-05: 5 mg via ORAL
  Filled 2017-03-04: qty 1

## 2017-03-04 MED ORDER — ONDANSETRON HCL 4 MG PO TABS: 4.0000 mg | ORAL_TABLET | Freq: Four times a day (QID) | ORAL | Status: DC | PRN

## 2017-03-04 MED ORDER — ACETAMINOPHEN 650 MG RE SUPP: 650.0000 mg | Freq: Four times a day (QID) | RECTAL | Status: DC | PRN

## 2017-03-04 MED ORDER — ONDANSETRON HCL 4 MG/2ML IJ SOLN: 4.0000 mg | Freq: Four times a day (QID) | INTRAMUSCULAR | Status: DC | PRN

## 2017-03-04 MED ORDER — CLOPIDOGREL BISULFATE 75 MG PO TABS
75.0000 mg | ORAL_TABLET | Freq: Every day | ORAL | Status: DC
  Administered 2017-03-05 – 2017-03-07 (×3): 75 mg via ORAL
  Filled 2017-03-04 (×3): qty 1

## 2017-03-04 MED ORDER — AMITRIPTYLINE HCL 10 MG PO TABS
20.0000 mg | ORAL_TABLET | Freq: Every day | ORAL | Status: DC
  Administered 2017-03-05 – 2017-03-06 (×3): 20 mg via ORAL
  Filled 2017-03-04 (×4): qty 2

## 2017-03-04 MED ORDER — POTASSIUM CHLORIDE CRYS ER 10 MEQ PO TBCR
10.0000 meq | EXTENDED_RELEASE_TABLET | Freq: Every day | ORAL | Status: DC
Start: 2017-03-05 — End: 2017-03-07
  Administered 2017-03-05 – 2017-03-07 (×3): 10 meq via ORAL
  Filled 2017-03-04 (×3): qty 1

## 2017-03-04 MED ORDER — WARFARIN - PHARMACIST DOSING INPATIENT: Freq: Every day | Status: DC

## 2017-03-04 MED ORDER — DOXYCYCLINE HYCLATE 100 MG PO TABS
100.0000 mg | ORAL_TABLET | Freq: Two times a day (BID) | ORAL | Status: DC
  Administered 2017-03-05 – 2017-03-07 (×6): 100 mg via ORAL
  Filled 2017-03-04 (×6): qty 1

## 2017-03-04 MED ORDER — GUAIFENESIN ER 600 MG PO TB12
1200.0000 mg | ORAL_TABLET | Freq: Two times a day (BID) | ORAL | Status: DC
  Administered 2017-03-05 – 2017-03-07 (×6): 1200 mg via ORAL
  Filled 2017-03-04 (×6): qty 2

## 2017-03-04 MED ORDER — MOMETASONE FURO-FORMOTEROL FUM 200-5 MCG/ACT IN AERO
2.0000 | INHALATION_SPRAY | Freq: Two times a day (BID) | RESPIRATORY_TRACT | Status: DC
  Administered 2017-03-05 – 2017-03-07 (×5): 2 via RESPIRATORY_TRACT
  Filled 2017-03-04: qty 8.8

## 2017-03-04 MED ORDER — DIGOXIN 125 MCG PO TABS
0.1250 mg | ORAL_TABLET | ORAL | Status: DC
  Administered 2017-03-06: 0.125 mg via ORAL
  Filled 2017-03-04: qty 1

## 2017-03-04 MED ORDER — FUROSEMIDE 10 MG/ML IJ SOLN
40.0000 mg | Freq: Two times a day (BID) | INTRAMUSCULAR | Status: DC
  Administered 2017-03-05: 20 mg via INTRAVENOUS
  Filled 2017-03-04: qty 4

## 2017-03-04 MED ORDER — DONEPEZIL HCL 5 MG PO TABS
10.0000 mg | ORAL_TABLET | Freq: Every day | ORAL | Status: DC
  Administered 2017-03-05 – 2017-03-07 (×3): 10 mg via ORAL
  Filled 2017-03-04 (×3): qty 2

## 2017-03-04 NOTE — Telephone Encounter (Signed)
Okay 

## 2017-03-04 NOTE — Telephone Encounter (Signed)
New message  Patient daughter [name] [phone] calling with concerns of patient  being tired and SOB and wheezing for 2 days.   Pt c/o swelling: STAT is pt has developed SOB within 24 hours  1) How much weight have you gained and in what time span? 7lbs in 7 days  2) If swelling, where is the swelling located? Clothes tighter around stomach  3) Are you currently taking a fluid pill? yes  4) Are you currently SOB? SOB, Wheezing, tired, blood in sputum.   5) Do you have a log of your daily weights (if so, list)?158.2 today  6) Have you gained 3 pounds in a day or 5 pounds in a week? yes  7) Have you traveled recently? No

## 2017-03-04 NOTE — ED Notes (Signed)
Pt oxygen saturation steady 86-89%, placed patient on 2 L [place], EDP notified.

## 2017-03-04 NOTE — ED Notes (Signed)
Rounding on pt, asked if ready/able to use restroom to provide U/A, pt states "No I don't need to go now". Will check w/pt at later time. [employer]

## 2017-03-04 NOTE — Telephone Encounter (Signed)
Spoke with [name], DPR on file.  She states pt has gained 7lbs in 1 week but pt has been eating 3 fulls meals a day which she has not normally been doing.  Pt was on Prednisone while in hospital and for a few days after discharge.  Daughter states pt has been wheezing since d/c.  Pt also with SOB and blood tinged sputum.  Pt very weak.  Went to Urgent Care yesterday and was given an abt.  No CXR completed.  Daughter states O2 is normally 95-97% and last night it was 90-92%.  Daughter states pt is worse today and she wanted some guidance on what she needed to do.  Advised they should report to ER for eval d/t blood tinged sputum and worsening of weakness.  Advised important to eval for return of PNA vs CHF.  Advised I would send message to Dr. Tamala Julian to let him know pt is coming to hospital. Daughter appreciative for call.

## 2017-03-04 NOTE — H&P (Addendum)
History and Physical    Amber [name]:[id number] DOB: 09-02-1934 DOA: 03/04/2017  PCP: Antony Contras, MD  Patient coming from: Home.  Chief Complaint: Shortness of breath and fatigue.  HPI: Amber [name] is a 83 y.o. female with history of diastolic CHF, COPD, chronic atrial fibrillation, CAD, gout was brought to the ER after patient was found to be having increasing fatigue and shortness of breath.  Patient was just recently admitted 3 weeks ago for respiratory failure and at that time was treated for CHF and pneumonia.  Patient following which has had follow-up with cardiology and pulmonologist.  Patient at discharge was on Lasix 20 mg which was increased back to 40 mg by cardiologist shortly after discharge.  Denies any chest pain has been having some wheezing and productive cough for which patient had gone to urgent care center 2 days ago and was prescribed doxycycline.  Since patient symptoms was gradually and progressively worsening patient was brought to the ER.   Per the patient's daughter patient had a fall 2 days ago.  Not lose consciousness.  Patient also was noted to have cough with blood-tinged sputum last couple of days.  Has not noticed any obvious rectal bleeding.  ED Course: In the ER patient chest x-ray shows worsening bilateral pleural effusion with opacities consistent with CHF.  BNP was around 300 with mildly elevated troponin.  EKG was showing A. fib with ST-T changes comparable with the old EKG.  It is also noted that patient's hemoglobin has been steadily declining from previous admission.  Presently is around 10.4 which was 13 two weeks ago.  Review of Systems: As per HPI, rest all negative.   Past Medical History:  Diagnosis Date  . Abnormality of gait 08/02/2014  . Cerebrovascular disease 05/21/2016  . Cervical spinal stenosis   . Chronic atrial fibrillation (HCC)    a. chronic/rate controlled;  b. chronic coumadin.  . Chronic diastolic CHF (congestive heart  failure) ([place])    a. 02/2013 Echo: EF 55-60%, mild AI, mod dil LA.  . CKD (chronic kidney disease), stage III ([place])   . COPD with emphysema ([place])    PFT 05/02/10>>FEV1 1.35(62%), FEV1% 66, DLCO 75%  . Coronary artery disease    a. s/p CABG;  b. Abnormal nuc 2015 - managed medically.  . CVA (cerebral vascular accident) ([place])    left sided weakness  . Dementia   . Depression   . Fibromyalgia   . GERD (gastroesophageal reflux disease)    pepcid 2-3 times per week  . Gout   . Hemiparesis and alteration of sensations as late effects of stroke ([place]) 01/17/2015  . History of melanoma    squamous cell, melanoma  . Hyperlipidemia    a. statin intolerant, not felt to be candidate for PCSK9 due to chronically elevated CK levels.  . Hypertension   . Hypertensive heart disease   . Hypothyroidism   . Insomnia   . Intermittent confusion   . Memory change 01/23/2014  . Nasal polyposis   . Osteoarthritis   . Osteoporosis   . Pneumonia    1990  . Tobacco abuse     Past Surgical History:  Procedure Laterality Date  . ABDOMINAL HYSTERECTOMY    . BREAST LUMPECTOMY  1980s   Benign lesion - right  . carpel tunnel     right  . CORONARY ARTERY BYPASS GRAFT  2000  . EYE SURGERY     bilateral cataracts  . IR GENERIC HISTORICAL  03/31/2016   IR RADIOLOGIST EVAL & MGMT 03/31/2016 [facility]  . RADIOLOGY WITH ANESTHESIA  12/24/2011   Procedure: RADIOLOGY WITH ANESTHESIA;  Surgeon: Medication Radiologist, MD;  Location: [facility];  Service: Radiology;  Laterality: N/A;  Extra Cranial Vascular Stent  . RADIOLOGY WITH ANESTHESIA N/A 07/19/2014   Procedure: ANGIOPLASTY;  Surgeon: Luanne Bras, MD;  Location: [facility];  Service: Radiology;  Laterality: N/A;  . RADIOLOGY WITH ANESTHESIA N/A 07/27/2014   Procedure: ANGIOPLASTY;  Surgeon: Luanne Bras, MD;  Location: [facility];  Service: Radiology;  Laterality: N/A;  . TONSILLECTOMY       reports that she quit smoking about 40 years ago. Her smoking use  included cigarettes. She has a 37.50 pack-year smoking history. she has never used smokeless tobacco. She reports that she does not drink alcohol or use drugs.  Allergies  Allergen Reactions  . Ezetimibe Other (See Comments)    Myalgia  . Welchol [Colesevelam Hcl] Other (See Comments)    Muscle aches  . Adhesive [Tape] Itching, Rash and Other (See Comments)    Burning   . Ceclor [Cefaclor] Other (See Comments)    Unknown allergic reaction  . Elastic Bandages & [Zinc] Rash and Other (See Comments)    Turns red on the areas it touches  . Latex Itching, Rash and Other (See Comments)    Burning   . Lipitor [Atorvastatin Calcium] Other (See Comments)    Increased fibromyalgia pain  . Mevacor [Lovastatin] Other (See Comments)    Increased fibromyalgia pain  . Pravachol Other (See Comments)    Increased fibromyalgia pain  . Vasotec Other (See Comments)    Unknown allergic reaction    Family History  Problem Relation Age of Onset  . Stroke Mother   . CAD Father   . Diabetes type II Brother   . CAD Brother     Prior to Admission medications   Medication Sig Start Date End Date Taking? Authorizing Provider  allopurinol (ZYLOPRIM) 300 MG tablet Take 1 tablet (300 mg total) by mouth every other day. Patient taking differently: Take 300 mg by mouth daily.  03/06/16  Yes Burtis Junes, NP  amitriptyline (ELAVIL) 10 MG tablet TAKE 2 TABLETS BY MOUTH AT BEDTIME Patient taking differently: TAKE 20mg   BY MOUTH AT BEDTIME 09/01/16  Yes Kathrynn Ducking, MD  amLODipine (NORVASC) 5 MG tablet Take 1 tablet (5 mg total) by mouth at bedtime. 01/12/17  Yes Belva Crome, MD  benzonatate (TESSALON) 100 MG capsule Take 1 capsule (100 mg total) by mouth 3 (three) times daily. 02/17/17  Yes Georgette Shell, MD  bisacodyl (DULCOLAX) 5 MG EC tablet Take 2 tablets (10 mg total) by mouth daily. 02/18/17  Yes Georgette Shell, MD  celecoxib (CELEBREX) 200 MG capsule Take 200 mg by mouth every  morning.    Yes [provider]  citalopram (CELEXA) 20 MG tablet TAKE 1 TABLET BY MOUTH TWICE A DAY 11/09/14  Yes Darlin Coco, MD  clopidogrel (PLAVIX) 75 MG tablet Take 75 mg by mouth daily. 02/04/17  Yes [provider]  COUMADIN 5 MG tablet TAKE AS DIRECTED BY COUMADIN CLINIC. Hold coumadin November 24 th and November 25 th.  Recheck INR on Monday. Patient taking differently: Take 5-7.5 mg by mouth daily. Take 5 mg everyday except 7.5 mg on Tuesday and Saturday 01/03/17  Yes Hosie Poisson, MD  digoxin (LANOXIN) 0.125 MG tablet Take 1 tablet (0.125 mg total) by mouth on Mondays, Wednesdays, &  Fridays 03/27/16  Yes Belva Crome, MD  donepezil (ARICEPT) 10 MG tablet Take 1 tablet (10 mg total) by mouth at bedtime. Patient taking differently: Take 10 mg by mouth daily.  06/13/16  Yes Kathrynn Ducking, MD  doxycycline (VIBRAMYCIN) 100 MG capsule Take 100 mg by mouth 2 (two) times daily. 03/03/17  Yes [provider]  ergocalciferol (VITAMIN D2) 50000 UNITS capsule Take 50,000 Units by mouth 2 (two) times a week. Tuesday and Friday   Yes [provider]  famotidine (PEPCID) 20 MG tablet Take 1 tablet (20 mg total) by mouth daily. 02/18/17  Yes Georgette Shell, MD  furosemide (LASIX) 40 MG tablet Take 1 tablet (40 mg total) by mouth daily. 02/24/17  Yes Belva Crome, MD  guaiFENesin (MUCINEX) 600 MG 12 hr tablet Take 2 tablets (1,200 mg total) by mouth 2 (two) times daily. 02/17/17  Yes Georgette Shell, MD  ipratropium (ATROVENT) 0.03 % nasal spray Place 2 sprays into the nose 3 (three) times daily. 02/17/17  Yes Georgette Shell, MD  ipratropium-albuterol (DUONEB) 0.5-2.5 (3) MG/3ML SOLN Take 3 mLs by nebulization 3 (three) times daily. 02/24/17  Yes McQuaid, Ronie Spies, MD  KLOR-CON M20 20 MEQ tablet Take 0.5 tablets (10 mEq total) by mouth daily. 02/17/17  Yes Georgette Shell, MD  levothyroxine (SYNTHROID, LEVOTHROID) 25 MCG tablet Take 25 mcg by  mouth every morning.    Yes [provider]  losartan (COZAAR) 50 MG tablet Take 1 tablet (50 mg total) by mouth daily. 01/27/17 01/22/18 Yes Belva Crome, MD  memantine (NAMENDA XR) 28 MG CP24 24 hr capsule TAKE ONE CAPSULE BY MOUTH EVERY DAY 07/15/16  Yes Kathrynn Ducking, MD  metoprolol succinate (TOPROL-XL) 25 MG 24 hr tablet TAKE 1 TABLET (25 MG TOTAL) BY MOUTH DAILY. Patient taking differently: TAKE 1 TABLET (25 MG TOTAL) BY MOUTH DAILY. Morning 05/16/16  Yes Belva Crome, MD  mometasone-formoterol [facility]) 200-5 MCG/ACT AERO Inhale 2 puffs into the lungs 2 (two) times daily.   Yes [provider]  nitroGLYCERIN (NITROSTAT) 0.4 MG SL tablet Place 1 tablet (0.4 mg total) under the tongue every 5 (five) minutes as needed for chest pain. 09/13/12  Yes Darlin Coco, MD  PROAIR HFA 108 (90 BASE) MCG/ACT inhaler INHALE 2 PUFFS INTO LUNGS EVERY 6 HOURS AS NEEDED FOR WHEEZING 12/22/13  Yes Darlin Coco, MD  fluticasone (FLONASE) 50 MCG/ACT nasal spray Place 2 sprays into both nostrils 2 (two) times daily. Patient not taking: Reported on 03/04/2017 02/17/17   Georgette Shell, MD  polyethylene glycol [facility] Floria [facility]aveling) packet Take 17 g by mouth daily. Patient not taking: Reported on 03/04/2017 02/18/17   Georgette Shell, MD    Physical Exam: Vitals:   03/04/17 2123 03/04/17 2130 03/04/17 2200 03/04/17 2230  BP: (!) 161/76 140/64 (!) 179/68 137/79  Pulse: 87 91 94 80  Resp: (!) 21 (!) 26 19 (!) 22  Temp:      TempSrc:      SpO2: 96% 96% 96% 95%  Weight:      Height:          Constitutional: Moderately built and nourished. Vitals:   03/04/17 2123 03/04/17 2130 03/04/17 2200 03/04/17 2230  BP: (!) 161/76 140/64 (!) 179/68 137/79  Pulse: 87 91 94 80  Resp: (!) 21 (!) 26 19 (!) 22  Temp:      TempSrc:      SpO2: 96% 96% 96% 95%  Weight:      Height:       Eyes: Anicteric no pallor. ENMT: No discharge from the ears eyes nose or mouth. Neck: JVD  elevated no mass felt. Respiratory: No rhonchi but bilateral basilar crepitations. Cardiovascular: S1-S2 heard no murmurs appreciated. Abdomen: Soft nontender bowel sounds present. Musculoskeletal: No edema.  No joint effusion. Skin: No rash.  Skin appears warm. Neurologic: Alert awake oriented to time place and person.  Moves all extremities. Psychiatric: Appears normal.  Normal affect.   Labs on Admission: I have personally reviewed following labs and imaging studies  CBC: Recent Labs  Lab 03/04/17 1849  WBC 5.7  NEUTROABS 4.5  HGB 10.4*  HCT 32.3*  MCV 90.5  PLT 093   Basic Metabolic Panel: Recent Labs  Lab 03/04/17 1849  NA 137  K 3.6  CL 104  CO2 25  GLUCOSE 106*  BUN 14  CREATININE 0.75  CALCIUM 8.2*   GFR: Estimated Creatinine Clearance: 54.3 mL/min (by C-G formula based on SCr of 0.75 mg/dL). Liver Function Tests: Recent Labs  Lab 03/04/17 1849  AST 20  ALT 19  ALKPHOS 60  BILITOT 1.4*  PROT 6.2*  ALBUMIN 3.2*   No results for input(s): LIPASE, AMYLASE in the last 168 hours. No results for input(s): AMMONIA in the last 168 hours. Coagulation Profile: Recent Labs  Lab 02/27/17 03/04/17 1849  INR 3.7 2.45   Cardiac Enzymes: Recent Labs  Lab 03/04/17 1849  TROPONINI 0.05*   BNP (last 3 results) Recent Labs    01/23/17 1156  PROBNP 2,015*   HbA1C: No results for input(s): HGBA1C in the last 72 hours. CBG: No results for input(s): GLUCAP in the last 168 hours. Lipid Profile: No results for input(s): CHOL, HDL, LDLCALC, TRIG, CHOLHDL, LDLDIRECT in the last 72 hours. Thyroid Function Tests: No results for input(s): TSH, T4TOTAL, FREET4, T3FREE, THYROIDAB in the last 72 hours. Anemia Panel: No results for input(s): VITAMINB12, FOLATE, FERRITIN, TIBC, IRON, RETICCTPCT in the last 72 hours. Urine analysis:    Component Value Date/Time   COLORURINE YELLOW 03/04/2017 1847   APPEARANCEUR CLEAR 03/04/2017 1847   LABSPEC 1.008 03/04/2017  1847   PHURINE 5.0 03/04/2017 1847   GLUCOSEU NEGATIVE 03/04/2017 1847   GLUCOSEU NEGATIVE 02/28/2013 1235   HGBUR SMALL (A) 03/04/2017 1847   BILIRUBINUR NEGATIVE 03/04/2017 1847   KETONESUR NEGATIVE 03/04/2017 1847   PROTEINUR NEGATIVE 03/04/2017 1847   UROBILINOGEN 0.2 02/28/2013 1235   NITRITE NEGATIVE 03/04/2017 1847   LEUKOCYTESUR LARGE (A) 03/04/2017 1847   Sepsis Labs: @LABRCNTIP (procalcitonin:4,lacticidven:4) )No results found for this or any previous visit (from the past 240 hour(s)).   Radiological Exams on Admission: Dg Chest 2 View  Result Date: 03/04/2017 CLINICAL DATA:  Dyspnea and cough starting yesterday EXAM: CHEST  2 VIEW COMPARISON:  02/21/2016 FINDINGS: Status post CABG with borderline cardiomegaly. Aortic atherosclerosis is noted. New bilateral left greater than right pleural effusions with pulmonary vascular redistribution and bilateral airspace opacities are consistent with mild to moderate CHF. Superimposed pneumonia would be difficult to entirely exclude but given the bilaterality and diffuse distribution of the airspace opacities, findings are more in keeping with CHF. No acute osseous abnormality. Degenerative changes are noted along the dorsal spine. IMPRESSION: CHF with bilateral left greater than right pleural effusions. Status post CABG with aortic atherosclerosis. Electronically Signed   By: Ashley Royalty M.D.   On: 03/04/2017 19:14    EKG: Independently reviewed.  A. fib with ST-T changes comparable to the  old EKG.  Assessment/Plan Principal Problem:   Acute on chronic diastolic (congestive) heart failure (HCC) Active Problems:   Atrial fibrillation (HCC)   Fibromyalgia   Hypothyroidism   Hemiparesis and alteration of sensations as late effects of stroke (HCC)   CHF (congestive heart failure) ([place])    1. Acute respiratory failure likely from CHF and worsening anemia -patient was given Lasix 40 mg IV in the ER.  I have continued on Lasix 40 mg IV  twice daily.  Of concern is a worsening anemia for which I will be checking stool for occult blood.  Type and screen and closely follow CBC.  If rectal exam shows melena or gross blood and may have to hold Coumadin and reverse it.  Patient is on digoxin.  Check digoxin levels.  Closely follow intake output metabolic panel and daily weights.  As per the daughter patient has gained 7 pounds.  Patient had 2D echo recently on February 09, 2017 which showed EF of 60-65%. 2. Anemia normocytic normochromic progressively worsening -see #1.  We will check stool for occult blood and if rectal exam shows gross blood will need to hold Coumadin and reverse INR.  We will keep patient on Protonix IV. 3. CAD -troponin is mildly positive will cycle cardiac markers.  Patient is on Coumadin Toprol-XL and Plavix.  Intolerant to statins. 4. Hypothyroidism on Synthroid. 5. COPD -continue nebulizer. 6. Possible UTI and pneumonia -we will continue antibiotics doxycycline which was prescribed by urgent care.  Will check procalcitonin and if negative and also urine cultures are negative may discontinue antibiotics. 7. Recent fall.  Will get CT head. 8. History of gout on allopurinol.   DVT prophylaxis: Coumadin. Code Status: DNR. Family Communication: Patient's daughter. Disposition Plan: Home. Consults called: Cardiology. Admission status: Inpatient.   Rise Patience MD [facility] Pager [phone].  If 7PM-7AM, please contact night-coverage [email or web address] Password [facility]  03/04/2017, 11:37 PM

## 2017-03-04 NOTE — ED Triage Notes (Signed)
Per EMS, pt comes from home. SOB started yesterday. D/cd from here 2 wks ago with pneumonia after an 11 day admission. Still on anbx. Appt with pulmonologist tomorrow. Wheezing on L side, R side clear. Productive cough. Received a total of 10 albuterol and 0/5 atrovent. Pt states she feels better after treatment.  Patient is alert and oriented. Daughter on way. Hx. afib

## 2017-03-04 NOTE — Telephone Encounter (Signed)
Spoke with Judson Roch, she advised that since she did not have any openings today that if the patient needs to be seen that quickly, its best for her to go ahead to the ER.   Spoke with [employer] Advised her that SG nor anyone else in the office had openings today. Advised her to go ahead and take patient to the ER. She verbalized understanding. She wishes to keep appt with SG tomorrow morning.   Nothing else needed at time of call.

## 2017-03-04 NOTE — ED Notes (Signed)
U/A per MD order. Pt and family advised to call for assistance PRN when ready to provide sample. [employer]

## 2017-03-04 NOTE — ED Notes (Signed)
ED TO INPATIENT HANDOFF REPORT  Name/Age/Gender Amber [name] 83 y.o. female  Code Status    Code Status Orders  (From admission, onward)        Start     Ordered   03/04/17 2337  Do not attempt resuscitation (DNR)  Continuous    Question Answer Comment  In the event of cardiac or respiratory ARREST Do not call a "code blue"   In the event of cardiac or respiratory ARREST Do not perform Intubation, CPR, defibrillation or ACLS   In the event of cardiac or respiratory ARREST Use medication by any route, position, wound care, and other measures to relive pain and suffering. May use oxygen, suction and manual treatment of airway obstruction as needed for comfort.      03/04/17 2336    Code Status History    Date Active Date Inactive Code Status Order ID Comments User Context   02/06/2017 10:03 02/17/2017 21:33 DNR [id number]  Debbe Odea, MD ED   01/02/2017 00:42 01/03/2017 19:01 Full Code [id number]  Oswald Hillock, MD Inpatient   01/02/2017 00:24 01/02/2017 00:42 DNR [id number]  Oswald Hillock, MD Inpatient   07/27/2014 16:10 07/30/2014 12:41 Full Code [id number]  Luanne Bras, MD Inpatient   06/30/2014 14:21 07/01/2014 03:16 Full Code [id number]  Luanne Bras, MD HOV   02/13/2013 01:11 02/19/2013 14:18 Full Code [id number]  Toy Baker, MD Inpatient      Home/SNF/Other Home  Chief Complaint Shortness of Breath  Level of Care/Admitting Diagnosis ED Disposition    ED Disposition Condition [place] [facility]: [facility] [[id number]]  Level of Care: Telemetry [5]  Admit to tele based on following criteria: Acute CHF  Diagnosis: CHF (congestive heart failure) [facility]) [[id number]]  Admitting Physician: Rise Patience [phone]  Attending Physician: Rise Patience [phone]  Estimated length of stay: past midnight tomorrow  Certification:: I certify this patient will need inpatient services for at least 2 midnights  PT Class (Do Not  Modify): Inpatient [101]  PT Acc Code (Do Not Modify): Private [1]       Medical History Past Medical History:  Diagnosis Date  . Abnormality of gait 08/02/2014  . Cerebrovascular disease 05/21/2016  . Cervical spinal stenosis   . Chronic atrial fibrillation (HCC)    a. chronic/rate controlled;  b. chronic coumadin.  . Chronic diastolic CHF (congestive heart failure) ([place])    a. 02/2013 Echo: EF 55-60%, mild AI, mod dil LA.  . CKD (chronic kidney disease), stage III ([place])   . COPD with emphysema ([place])    PFT 05/02/10>>FEV1 1.35(62%), FEV1% 66, DLCO 75%  . Coronary artery disease    a. s/p CABG;  b. Abnormal nuc 2015 - managed medically.  . CVA (cerebral vascular accident) ([place])    left sided weakness  . Dementia   . Depression   . Fibromyalgia   . GERD (gastroesophageal reflux disease)    pepcid 2-3 times per week  . Gout   . Hemiparesis and alteration of sensations as late effects of stroke ([place]) 01/17/2015  . History of melanoma    squamous cell, melanoma  . Hyperlipidemia    a. statin intolerant, not felt to be candidate for PCSK9 due to chronically elevated CK levels.  . Hypertension   . Hypertensive heart disease   . Hypothyroidism   . Insomnia   . Intermittent confusion   . Memory change 01/23/2014  . Nasal polyposis   . Osteoarthritis   .  Osteoporosis   . Pneumonia    1990  . Tobacco abuse     Allergies Allergies  Allergen Reactions  . Ezetimibe Other (See Comments)    Myalgia  . Welchol [Colesevelam Hcl] Other (See Comments)    Muscle aches  . Adhesive [Tape] Itching, Rash and Other (See Comments)    Burning   . Ceclor [Cefaclor] Other (See Comments)    Unknown allergic reaction  . Elastic Bandages & [Zinc] Rash and Other (See Comments)    Turns red on the areas it touches  . Latex Itching, Rash and Other (See Comments)    Burning   . Lipitor [Atorvastatin Calcium] Other (See Comments)    Increased fibromyalgia pain  . Mevacor [Lovastatin] Other  (See Comments)    Increased fibromyalgia pain  . Pravachol Other (See Comments)    Increased fibromyalgia pain  . Vasotec Other (See Comments)    Unknown allergic reaction    IV Location/Drains/Wounds Patient Lines/Drains/Airways Status   Active Line/Drains/Airways    Name:   Placement date:   Placement time:   Site:   Days:   Peripheral IV 03/04/17 Right Antecubital   03/04/17    1759    Antecubital   less than 1   External Urinary Catheter   03/04/17    2155    -   less than 1   Incision (Closed) 06/30/14 Groin Right   06/30/14    1333     978   Incision (Closed) 07/27/14 Groin Right   07/27/14    1345     951          Labs/Imaging Results for orders placed or performed during the hospital encounter of 03/04/17 (from the past 48 hour(s))  Urinalysis, Routine w reflex microscopic     Status: Abnormal   Collection Time: 03/04/17  6:47 PM  Result Value Ref Range   Color, Urine YELLOW YELLOW   APPearance CLEAR CLEAR   Specific Gravity, Urine 1.008 1.005 - 1.030   pH 5.0 5.0 - 8.0   Glucose, UA NEGATIVE NEGATIVE mg/dL   Hgb urine dipstick SMALL (A) NEGATIVE   Bilirubin Urine NEGATIVE NEGATIVE   Ketones, ur NEGATIVE NEGATIVE mg/dL   Protein, ur NEGATIVE NEGATIVE mg/dL   Nitrite NEGATIVE NEGATIVE   Leukocytes, UA LARGE (A) NEGATIVE   RBC / HPF 0-5 0 - 5 RBC/hpf   WBC, UA 6-30 0 - 5 WBC/hpf   Bacteria, UA NONE SEEN NONE SEEN   Squamous Epithelial / LPF 0-5 (A) NONE SEEN   Mucus PRESENT   Comprehensive metabolic panel     Status: Abnormal   Collection Time: 03/04/17  6:49 PM  Result Value Ref Range   Sodium 137 135 - 145 mmol/L   Potassium 3.6 3.5 - 5.1 mmol/L   Chloride 104 101 - 111 mmol/L   CO2 25 22 - 32 mmol/L   Glucose, Bld 106 (H) 65 - 99 mg/dL   BUN 14 6 - 20 mg/dL   Creatinine, Ser 0.75 0.44 - 1.00 mg/dL   Calcium 8.2 (L) 8.9 - 10.3 mg/dL   Total Protein 6.2 (L) 6.5 - 8.1 g/dL   Albumin 3.2 (L) 3.5 - 5.0 g/dL   AST 20 15 - 41 U/L   ALT 19 14 - 54 U/L    Alkaline Phosphatase 60 38 - 126 U/L   Total Bilirubin 1.4 (H) 0.3 - 1.2 mg/dL   GFR calc non Af Amer >60 >60 mL/min   GFR calc  Af Amer >60 >60 mL/min    Comment: (NOTE) The eGFR has been calculated using the CKD EPI equation. This calculation has not been validated in all clinical situations. eGFR's persistently <60 mL/min signify possible Chronic Kidney Disease.    Anion gap 8 5 - 15  CBC with Differential     Status: Abnormal   Collection Time: 03/04/17  6:49 PM  Result Value Ref Range   WBC 5.7 4.0 - 10.5 K/uL   RBC 3.57 (L) 3.87 - 5.11 MIL/uL   Hemoglobin 10.4 (L) 12.0 - 15.0 g/dL   HCT 32.3 (L) 36.0 - 46.0 %   MCV 90.5 78.0 - 100.0 fL   MCH 29.1 26.0 - 34.0 pg   MCHC 32.2 30.0 - 36.0 g/dL   RDW 16.4 (H) 11.5 - 15.5 %   Platelets 162 150 - 400 K/uL   Neutrophils Relative % 79 %   Neutro Abs 4.5 1.7 - 7.7 K/uL   Lymphocytes Relative 14 %   Lymphs Abs 0.8 0.7 - 4.0 K/uL   Monocytes Relative 6 %   Monocytes Absolute 0.3 0.1 - 1.0 K/uL   Eosinophils Relative 1 %   Eosinophils Absolute 0.1 0.0 - 0.7 K/uL   Basophils Relative 0 %   Basophils Absolute 0.0 0.0 - 0.1 K/uL  Brain natriuretic peptide     Status: Abnormal   Collection Time: 03/04/17  6:49 PM  Result Value Ref Range   B Natriuretic Peptide 358.9 (H) 0.0 - 100.0 pg/mL  Troponin I     Status: Abnormal   Collection Time: 03/04/17  6:49 PM  Result Value Ref Range   Troponin I 0.05 (HH) <0.03 ng/mL    Comment: CRITICAL RESULT CALLED TO, READ BACK BY AND VERIFIED WITH: Annabell Howells [id number] @ 1947 BY J SCOTTON   Protime-INR     Status: Abnormal   Collection Time: 03/04/17  6:49 PM  Result Value Ref Range   Prothrombin Time 26.4 (H) 11.4 - 15.2 seconds   INR 2.45    Dg Chest 2 View  Result Date: 03/04/2017 CLINICAL DATA:  Dyspnea and cough starting yesterday EXAM: CHEST  2 VIEW COMPARISON:  02/21/2016 FINDINGS: Status post CABG with borderline cardiomegaly. Aortic atherosclerosis is noted. New bilateral  left greater than right pleural effusions with pulmonary vascular redistribution and bilateral airspace opacities are consistent with mild to moderate CHF. Superimposed pneumonia would be difficult to entirely exclude but given the bilaterality and diffuse distribution of the airspace opacities, findings are more in keeping with CHF. No acute osseous abnormality. Degenerative changes are noted along the dorsal spine. IMPRESSION: CHF with bilateral left greater than right pleural effusions. Status post CABG with aortic atherosclerosis. Electronically Signed   By: Ashley Royalty M.D.   On: 03/04/2017 19:14    Pending Labs Unresulted Labs (From admission, onward)   Start     Ordered   03/05/17 7494  Basic metabolic panel  Daily,   R     03/04/17 2336   03/05/17 0500  CBC  Tomorrow morning,   R     03/04/17 2336   03/05/17 0500  Magnesium  Tomorrow morning,   R     03/04/17 2336   03/05/17 0500  Protime-INR  Daily,   R     03/04/17 2350   03/04/17 2336  Digoxin level  Once,   R     03/04/17 2336   03/04/17 1911  Respiratory Panel by PCR  (Respiratory virus panel)  Once,  R     03/04/17 1910      Vitals/Pain Today's Vitals   03/04/17 2123 03/04/17 2130 03/04/17 2200 03/04/17 2230  BP: (!) 161/76 140/64 (!) 179/68 137/79  Pulse: 87 91 94 80  Resp: (!) 21 (!) 26 19 (!) 22  Temp:      TempSrc:      SpO2: 96% 96% 96% 95%  Weight:      Height:      PainSc:        Isolation Precautions Droplet precaution  Medications Medications  allopurinol (ZYLOPRIM) tablet 300 mg (not administered)  amitriptyline (ELAVIL) tablet 20 mg (not administered)  amLODipine (NORVASC) tablet 5 mg (not administered)  benzonatate (TESSALON) capsule 100 mg (not administered)  bisacodyl (DULCOLAX) EC tablet 10 mg (not administered)  citalopram (CELEXA) tablet 20 mg (not administered)  clopidogrel (PLAVIX) tablet 75 mg (not administered)  digoxin (LANOXIN) tablet 0.125 mg (not administered)  donepezil  (ARICEPT) tablet 10 mg (not administered)  doxycycline (VIBRA-TABS) tablet 100 mg (not administered)  famotidine (PEPCID) tablet 20 mg (not administered)  guaiFENesin (MUCINEX) 12 hr tablet 1,200 mg (not administered)  ipratropium (ATROVENT) 0.03 % nasal spray 2 spray (not administered)  ipratropium-albuterol (DUONEB) 0.5-2.5 (3) MG/3ML nebulizer solution 3 mL (not administered)  potassium chloride (K-DUR,KLOR-CON) CR tablet 10 mEq (not administered)  levothyroxine (SYNTHROID, LEVOTHROID) tablet 25 mcg (not administered)  losartan (COZAAR) tablet 50 mg (not administered)  memantine (NAMENDA XR) 24 hr capsule 28 mg (not administered)  metoprolol succinate (TOPROL-XL) 24 hr tablet 25 mg (not administered)  mometasone-formoterol (DULERA) 200-5 MCG/ACT inhaler 2 puff (not administered)  nitroGLYCERIN (NITROSTAT) SL tablet 0.4 mg (not administered)  acetaminophen (TYLENOL) tablet 650 mg (not administered)    Or  acetaminophen (TYLENOL) suppository 650 mg (not administered)  ondansetron (ZOFRAN) tablet 4 mg (not administered)    Or  ondansetron (ZOFRAN) injection 4 mg (not administered)  furosemide (LASIX) injection 40 mg (not administered)  albuterol (PROVENTIL) (2.5 MG/3ML) 0.083% nebulizer solution 2.5 mg (not administered)  warfarin (COUMADIN) tablet 5 mg (not administered)  Warfarin - Pharmacist Dosing Inpatient (not administered)  albuterol (PROVENTIL) (2.5 MG/3ML) 0.083% nebulizer solution 5 mg (5 mg Nebulization Given 03/04/17 1957)  ipratropium (ATROVENT) nebulizer solution 0.5 mg (0.5 mg Nebulization Given 03/04/17 1957)  furosemide (LASIX) injection 40 mg (40 mg Intravenous Given 03/04/17 2154)    Mobility non-ambulatory

## 2017-03-04 NOTE — Progress Notes (Signed)
ANTICOAGULATION CONSULT NOTE - Initial Consult  Pharmacy Consult for Warfarin Indication: atrial fibrillation  Allergies  Allergen Reactions  . Ezetimibe Other (See Comments)    Myalgia  . Welchol [Colesevelam Hcl] Other (See Comments)    Muscle aches  . Adhesive [Tape] Itching, Rash and Other (See Comments)    Burning   . Ceclor [Cefaclor] Other (See Comments)    Unknown allergic reaction  . Elastic Bandages & [Zinc] Rash and Other (See Comments)    Turns red on the areas it touches  . Latex Itching, Rash and Other (See Comments)    Burning   . Lipitor [Atorvastatin Calcium] Other (See Comments)    Increased fibromyalgia pain  . Mevacor [Lovastatin] Other (See Comments)    Increased fibromyalgia pain  . Pravachol Other (See Comments)    Increased fibromyalgia pain  . Vasotec Other (See Comments)    Unknown allergic reaction    Patient Measurements: Height: 5\' 4"  (162.6 cm) Weight: 157 lb (71.2 kg) IBW/kg (Calculated) : 54.7   Vital Signs: Temp: 98.6 F (37 C) (01/23 1947) Temp Source: Oral (01/23 1947) BP: 159/58 (01/23 2330) Pulse Rate: 81 (01/23 2330)  Labs: Recent Labs    03/04/17 1849  HGB 10.4*  HCT 32.3*  PLT 162  LABPROT 26.4*  INR 2.45  CREATININE 0.75  TROPONINI 0.05*    Estimated Creatinine Clearance: 54.3 mL/min (by C-G formula based on SCr of 0.75 mg/dL).   Medical History: Past Medical History:  Diagnosis Date  . Abnormality of gait 08/02/2014  . Cerebrovascular disease 05/21/2016  . Cervical spinal stenosis   . Chronic atrial fibrillation (HCC)    a. chronic/rate controlled;  b. chronic coumadin.  . Chronic diastolic CHF (congestive heart failure) ([place])    a. 02/2013 Echo: EF 55-60%, mild AI, mod dil LA.  . CKD (chronic kidney disease), stage III ([place])   . COPD with emphysema ([place])    PFT 05/02/10>>FEV1 1.35(62%), FEV1% 66, DLCO 75%  . Coronary artery disease    a. s/p CABG;  b. Abnormal nuc 2015 - managed medically.  . CVA  (cerebral vascular accident) ([place])    left sided weakness  . Dementia   . Depression   . Fibromyalgia   . GERD (gastroesophageal reflux disease)    pepcid 2-3 times per week  . Gout   . Hemiparesis and alteration of sensations as late effects of stroke ([place]) 01/17/2015  . History of melanoma    squamous cell, melanoma  . Hyperlipidemia    a. statin intolerant, not felt to be candidate for PCSK9 due to chronically elevated CK levels.  . Hypertension   . Hypertensive heart disease   . Hypothyroidism   . Insomnia   . Intermittent confusion   . Memory change 01/23/2014  . Nasal polyposis   . Osteoarthritis   . Osteoporosis   . Pneumonia    1990  . Tobacco abuse     Medications:  Scheduled:  . [START ON 03/05/2017] allopurinol  300 mg Oral Daily  . amitriptyline  20 mg Oral QHS  . amLODipine  5 mg Oral QHS  . benzonatate  100 mg Oral TID  . [START ON 03/05/2017] bisacodyl  10 mg Oral Daily  . citalopram  20 mg Oral BID  . [START ON 03/05/2017] clopidogrel  75 mg Oral Daily  . [START ON 03/06/2017] digoxin  0.125 mg Oral Q M,W,F  . [START ON 03/05/2017] donepezil  10 mg Oral Daily  . doxycycline  100  mg Oral BID  . [START ON 03/05/2017] famotidine  20 mg Oral Daily  . [START ON 03/05/2017] furosemide  40 mg Intravenous Q12H  . guaiFENesin  1,200 mg Oral BID  . ipratropium  2 spray Nasal TID  . ipratropium-albuterol  3 mL Nebulization TID  . [START ON 03/05/2017] levothyroxine  25 mcg Oral BH-q7a  . [START ON 03/05/2017] losartan  50 mg Oral Daily  . [START ON 03/05/2017] memantine  28 mg Oral Daily  . [START ON 03/05/2017] metoprolol succinate  25 mg Oral Daily  . mometasone-formoterol  2 puff Inhalation BID  . [START ON 03/05/2017] potassium chloride SA  10 mEq Oral Daily  . [START ON 03/05/2017] warfarin  5 mg Oral Once  . [START ON 03/05/2017] Warfarin - Pharmacist Dosing Inpatient   Does not apply q1800   Infusions:    Assessment: 89 yoF c/o SOB on chronic warfarin for A-fib.  Warfarin per Rx consult. HD 5 mg daily except 7.5 mg Tues/Sat. LD 1/22 0830 and INR=2.45 on admission Goal of Therapy:  INR 2-3    Plan:  Warfarin 5 mg x1 tonight  Daily PT/INR  Lawana Pai R 03/04/2017,11:57 PM

## 2017-03-04 NOTE — ED Provider Notes (Signed)
[facility] DEPT Provider Note   CSN: [id number] Arrival date & time: 03/04/17  1743     History   Chief Complaint Chief Complaint  Patient presents with  . Shortness of Breath    HPI Amber [name] is a 83 y.o. female.  Patient here for evaluation of weight gain, cough, sputum production, and shortness of breath.  Recently hospitalized, discharged about 3 weeks ago after multifactorial shortness of breath.  Also yesterday she was seen at an urgent care and started on an antibiotic, for a respiratory infection.  Her weight has been increased almost 7 pounds recently, causing her home health service to request that she be evaluated for weight gain.  She is using her nebulizer 3 times in the last 24 hours for wheezing, and continues to wheeze.  Her daughter is concerned that the virus that she was diagnosed with several weeks ago has not gone away yet.  Her daughter states that she has been eating more than usual including snacks regularly.  She has a follow-up appointment with her pulmonologist, tomorrow, and reportedly is being considered for workup with pulmonary function testing.  There are no other known modifying factors.  HPI  Past Medical History:  Diagnosis Date  . Abnormality of gait 08/02/2014  . Cerebrovascular disease 05/21/2016  . Cervical spinal stenosis   . Chronic atrial fibrillation (HCC)    a. chronic/rate controlled;  b. chronic coumadin.  . Chronic diastolic CHF (congestive heart failure) ([place])    a. 02/2013 Echo: EF 55-60%, mild AI, mod dil LA.  . CKD (chronic kidney disease), stage III ([place])   . COPD with emphysema ([place])    PFT 05/02/10>>FEV1 1.35(62%), FEV1% 66, DLCO 75%  . Coronary artery disease    a. s/p CABG;  b. Abnormal nuc 2015 - managed medically.  . CVA (cerebral vascular accident) ([place])    left sided weakness  . Dementia   . Depression   . Fibromyalgia   . GERD (gastroesophageal reflux disease)    pepcid 2-3 times  per week  . Gout   . Hemiparesis and alteration of sensations as late effects of stroke ([place]) 01/17/2015  . History of melanoma    squamous cell, melanoma  . Hyperlipidemia    a. statin intolerant, not felt to be candidate for PCSK9 due to chronically elevated CK levels.  . Hypertension   . Hypertensive heart disease   . Hypothyroidism   . Insomnia   . Intermittent confusion   . Memory change 01/23/2014  . Nasal polyposis   . Osteoarthritis   . Osteoporosis   . Pneumonia    1990  . Tobacco abuse     Patient Active Problem List   Diagnosis Date Noted  . Acute CHF (congestive heart failure) ([place]) 03/04/2017  . Acute on chronic diastolic (congestive) heart failure ([place]) 03/04/2017  . CHF (congestive heart failure) ([place]) 03/04/2017  . COPD exacerbation ([place]) 02/06/2017  . Acute on chronic respiratory failure with hypoxia ([place]) 02/06/2017  . Dementia 02/06/2017  . SIRS (systemic inflammatory response syndrome) ([place]) 02/06/2017  . Occult blood in stools 02/06/2017  . Lactic acidosis 02/06/2017  . Acute bronchitis 01/01/2017  . Encounter for therapeutic drug monitoring 10/20/2016  . Cerebrovascular disease 05/21/2016  . Hemiparesis and alteration of sensations as late effects of stroke ([place]) 01/17/2015  . Memory change 01/23/2014  . Delirium due to multiple etiologies 02/14/2013  . 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 Long-term (current) use of anticoagulants   .  Streptococcal pneumonia ([place]) 02/12/2013  . Vertebral artery stenosis 12/18/2011  . Fibromyalgia 05/10/2010  . Depression 05/10/2010  . Cervical spinal stenosis 05/10/2010  . Hypothyroidism 05/10/2010  . Osteoporosis 05/10/2010  . Coronary artery disease involving coronary bypass graft of native heart with angina pectoris ([place]) 05/10/2010  . Chronic diastolic heart failure ([place])   . Dyslipidemia   . Atrial fibrillation ([place]) 05/06/2010  . Unspecified cerebral artery occlusion with cerebral infarction 05/06/2010      Past Surgical History:  Procedure Laterality Date  . ABDOMINAL HYSTERECTOMY    . BREAST LUMPECTOMY  1980s   Benign lesion - right  . carpel tunnel     right  . CORONARY ARTERY BYPASS GRAFT  2000  . EYE SURGERY     bilateral cataracts  . IR GENERIC HISTORICAL  03/31/2016   IR RADIOLOGIST EVAL & MGMT 03/31/2016 [facility]INTER[facility] RAD  . RADIOLOGY WITH ANESTHESIA  12/24/2011   Procedure: RADIOLOGY WITH ANESTHESIA;  Surgeon: Medication Radiologist, MD;  Location: [facility];  Service: Radiology;  Laterality: N/A;  Extra Cranial Vascular Stent  . RADIOLOGY WITH ANESTHESIA N/A 07/19/2014   Procedure: ANGIOPLASTY;  Surgeon: Luanne Bras, MD;  Location: [facility];  Service: Radiology;  Laterality: N/A;  . RADIOLOGY WITH ANESTHESIA N/A 07/27/2014   Procedure: ANGIOPLASTY;  Surgeon: Luanne Bras, MD;  Location: [facility];  Service: Radiology;  Laterality: N/A;  . TONSILLECTOMY      OB History    No data available       Home Medications    Prior to Admission medications   Medication Sig Start Date End Date Taking? Authorizing Provider  allopurinol (ZYLOPRIM) 300 MG tablet Take 1 tablet (300 mg total) by mouth every other day. Patient taking differently: Take 300 mg by mouth daily.  03/06/16  Yes Burtis Junes, NP  amitriptyline (ELAVIL) 10 MG tablet TAKE 2 TABLETS BY MOUTH AT BEDTIME Patient taking differently: TAKE 20mg   BY MOUTH AT BEDTIME 09/01/16  Yes Kathrynn Ducking, MD  amLODipine (NORVASC) 5 MG tablet Take 1 tablet (5 mg total) by mouth at bedtime. 01/12/17  Yes Belva Crome, MD  benzonatate (TESSALON) 100 MG capsule Take 1 capsule (100 mg total) by mouth 3 (three) times daily. 02/17/17  Yes Georgette Shell, MD  bisacodyl (DULCOLAX) 5 MG EC tablet Take 2 tablets (10 mg total) by mouth daily. 02/18/17  Yes Georgette Shell, MD  celecoxib (CELEBREX) 200 MG capsule Take 200 mg by mouth every morning.    Yes [provider]  citalopram (CELEXA) 20 MG tablet TAKE 1 TABLET BY  MOUTH TWICE A DAY 11/09/14  Yes Darlin Coco, MD  clopidogrel (PLAVIX) 75 MG tablet Take 75 mg by mouth daily. 02/04/17  Yes [provider]  COUMADIN 5 MG tablet TAKE AS DIRECTED BY COUMADIN CLINIC. Hold coumadin November 24 th and November 25 th.  Recheck INR on Monday. Patient taking differently: Take 5-7.5 mg by mouth daily. Take 5 mg everyday except 7.5 mg on Tuesday and Saturday 01/03/17  Yes Hosie Poisson, MD  digoxin (LANOXIN) 0.125 MG tablet Take 1 tablet (0.125 mg total) by mouth on Mondays, Wednesdays, & Fridays 03/27/16  Yes Belva Crome, MD  donepezil (ARICEPT) 10 MG tablet Take 1 tablet (10 mg total) by mouth at bedtime. Patient taking differently: Take 10 mg by mouth daily.  06/13/16  Yes Kathrynn Ducking, MD  doxycycline (VIBRAMYCIN) 100 MG capsule Take 100 mg by mouth 2 (two) times daily. 03/03/17  Yes  [provider]  ergocalciferol (VITAMIN D2) 50000 UNITS capsule Take 50,000 Units by mouth 2 (two) times a week. Tuesday and Friday   Yes [provider]  famotidine (PEPCID) 20 MG tablet Take 1 tablet (20 mg total) by mouth daily. 02/18/17  Yes Georgette Shell, MD  furosemide (LASIX) 40 MG tablet Take 1 tablet (40 mg total) by mouth daily. 02/24/17  Yes Belva Crome, MD  guaiFENesin (MUCINEX) 600 MG 12 hr tablet Take 2 tablets (1,200 mg total) by mouth 2 (two) times daily. 02/17/17  Yes Georgette Shell, MD  ipratropium (ATROVENT) 0.03 % nasal spray Place 2 sprays into the nose 3 (three) times daily. 02/17/17  Yes Georgette Shell, MD  ipratropium-albuterol (DUONEB) 0.5-2.5 (3) MG/3ML SOLN Take 3 mLs by nebulization 3 (three) times daily. 02/24/17  Yes McQuaid, Ronie Spies, MD  KLOR-CON M20 20 MEQ tablet Take 0.5 tablets (10 mEq total) by mouth daily. 02/17/17  Yes Georgette Shell, MD  levothyroxine (SYNTHROID, LEVOTHROID) 25 MCG tablet Take 25 mcg by mouth every morning.    Yes [provider]  losartan (COZAAR) 50 MG tablet Take 1  tablet (50 mg total) by mouth daily. 01/27/17 01/22/18 Yes Belva Crome, MD  memantine (NAMENDA XR) 28 MG CP24 24 hr capsule TAKE ONE CAPSULE BY MOUTH EVERY DAY 07/15/16  Yes Kathrynn Ducking, MD  metoprolol succinate (TOPROL-XL) 25 MG 24 hr tablet TAKE 1 TABLET (25 MG TOTAL) BY MOUTH DAILY. Patient taking differently: TAKE 1 TABLET (25 MG TOTAL) BY MOUTH DAILY. Morning 05/16/16  Yes Belva Crome, MD  mometasone-formoterol [facility]) 200-5 MCG/ACT AERO Inhale 2 puffs into the lungs 2 (two) times daily.   Yes [provider]  nitroGLYCERIN (NITROSTAT) 0.4 MG SL tablet Place 1 tablet (0.4 mg total) under the tongue every 5 (five) minutes as needed for chest pain. 09/13/12  Yes Darlin Coco, MD  PROAIR HFA 108 (90 BASE) MCG/ACT inhaler INHALE 2 PUFFS INTO LUNGS EVERY 6 HOURS AS NEEDED FOR WHEEZING 12/22/13  Yes Darlin Coco, MD  fluticasone (FLONASE) 50 MCG/ACT nasal spray Place 2 sprays into both nostrils 2 (two) times daily. Patient not taking: Reported on 03/04/2017 02/17/17   Georgette Shell, MD  polyethylene glycol [facility] / Floria [facility]) packet Take 17 g by mouth daily. Patient not taking: Reported on 03/04/2017 02/18/17   Georgette Shell, MD    Family History Family History  Problem Relation Age of Onset  . Stroke Mother   . CAD Father   . Diabetes type II Brother   . CAD Brother     Social History Social History   Tobacco Use  . Smoking status: Former Smoker    Packs/day: 1.50    Years: 25.00    Pack years: 37.50    Types: Cigarettes    Last attempt to quit: 02/10/1977    Years since quitting: 40.0  . Smokeless tobacco: Never Used  Substance Use Topics  . Alcohol use: No  . Drug use: No     Allergies   Ezetimibe; Welchol [colesevelam hcl]; Adhesive [tape]; Ceclor [cefaclor]; Elastic bandages & [zinc]; Latex; Lipitor [atorvastatin calcium]; Mevacor [lovastatin]; Pravachol; and Vasotec   Review of Systems Review of Systems  All other systems reviewed  and are negative.    Physical Exam Updated Vital Signs BP 137/79   Pulse 80   Temp 98.6 F (37 C) (Oral)   Resp (!) 22   Ht 5\' 4"  (1.626 m)   Wt  71.2 kg (157 lb)   SpO2 95%   BMI 26.95 kg/m   Physical Exam  Constitutional: She is oriented to person, place, and time. She appears well-developed. She does not appear ill.  Elderly, frail  HENT:  Head: Normocephalic and atraumatic.  Eyes: Conjunctivae and EOM are normal. Pupils are equal, round, and reactive to light.  Neck: Normal range of motion and phonation normal. Neck supple.  Cardiovascular: Normal rate and regular rhythm.  Pulmonary/Chest: Effort normal. She has decreased breath sounds in the right lower field and the left lower field. She has wheezes in the right middle field, the left upper field and the left middle field. She has no rhonchi. She has no rales. She exhibits no tenderness.  Abdominal: Soft. She exhibits no distension. There is no tenderness. There is no guarding.  Musculoskeletal: Normal range of motion.       Right lower leg: She exhibits no tenderness and no edema.       Left lower leg: She exhibits no tenderness and no edema.  Neurological: She is alert and oriented to person, place, and time. She exhibits normal muscle tone.  Skin: Skin is warm and dry.  Psychiatric: She has a normal mood and affect. Her behavior is normal. Judgment and thought content normal.  Nursing note and vitals reviewed.    ED Treatments / Results  Labs (all labs ordered are listed, but only abnormal results are displayed) Labs Reviewed  COMPREHENSIVE METABOLIC PANEL - Abnormal; Notable for the following components:      Result Value   Glucose, Bld 106 (*)    Calcium 8.2 (*)    Total Protein 6.2 (*)    Albumin 3.2 (*)    Total Bilirubin 1.4 (*)    All other components within normal limits  CBC WITH DIFFERENTIAL/PLATELET - Abnormal; Notable for the following components:   RBC 3.57 (*)    Hemoglobin 10.4 (*)    HCT 32.3  (*)    RDW 16.4 (*)    All other components within normal limits  BRAIN NATRIURETIC PEPTIDE - Abnormal; Notable for the following components:   B Natriuretic Peptide 358.9 (*)    All other components within normal limits  TROPONIN I - Abnormal; Notable for the following components:   Troponin I 0.05 (*)    All other components within normal limits  URINALYSIS, ROUTINE W REFLEX MICROSCOPIC - Abnormal; Notable for the following components:   Hgb urine dipstick SMALL (*)    Leukocytes, UA LARGE (*)    Squamous Epithelial / LPF 0-5 (*)    All other components within normal limits  PROTIME-INR - Abnormal; Notable for the following components:   Prothrombin Time 26.4 (*)    All other components within normal limits  RESPIRATORY PANEL BY PCR  BASIC METABOLIC PANEL  CBC  MAGNESIUM  DIGOXIN LEVEL    EKG  EKG Interpretation None       Radiology Dg Chest 2 View  Result Date: 03/04/2017 CLINICAL DATA:  Dyspnea and cough starting yesterday EXAM: CHEST  2 VIEW COMPARISON:  02/21/2016 FINDINGS: Status post CABG with borderline cardiomegaly. Aortic atherosclerosis is noted. New bilateral left greater than right pleural effusions with pulmonary vascular redistribution and bilateral airspace opacities are consistent with mild to moderate CHF. Superimposed pneumonia would be difficult to entirely exclude but given the bilaterality and diffuse distribution of the airspace opacities, findings are more in keeping with CHF. No acute osseous abnormality. Degenerative changes are noted along the dorsal spine.  IMPRESSION: CHF with bilateral left greater than right pleural effusions. Status post CABG with aortic atherosclerosis. Electronically Signed   By: Ashley Royalty M.D.   On: 03/04/2017 19:14    Procedures .Critical Care Performed by: Daleen Bo, MD Authorized by: Daleen Bo, MD   Critical care provider statement:    Critical care time (minutes):  40   Critical care start time:  03/04/2017  6:40 PM   Critical care end time:  03/04/2017 10:26 PM   Critical care time was exclusive of:  Separately billable procedures and treating other patients   Critical care was necessary to treat or prevent imminent or life-threatening deterioration of the following conditions:  Respiratory failure   Critical care was time spent personally by me on the following activities:  Blood draw for specimens, development of treatment plan with patient or surrogate, discussions with consultants, evaluation of patient's response to treatment, examination of patient, ordering and performing treatments and interventions, ordering and review of laboratory studies, ordering and review of radiographic studies, pulse oximetry, re-evaluation of patient's condition and review of old charts     (including critical care time)  Medications Ordered in ED Medications  allopurinol (ZYLOPRIM) tablet 300 mg (not administered)  amitriptyline (ELAVIL) tablet 20 mg (not administered)  amLODipine (NORVASC) tablet 5 mg (not administered)  benzonatate (TESSALON) capsule 100 mg (not administered)  bisacodyl (DULCOLAX) EC tablet 10 mg (not administered)  citalopram (CELEXA) tablet 20 mg (not administered)  clopidogrel (PLAVIX) tablet 75 mg (not administered)  digoxin (LANOXIN) tablet 0.125 mg (not administered)  donepezil (ARICEPT) tablet 10 mg (not administered)  doxycycline (VIBRA-TABS) tablet 100 mg (not administered)  famotidine (PEPCID) tablet 20 mg (not administered)  guaiFENesin (MUCINEX) 12 hr tablet 1,200 mg (not administered)  ipratropium (ATROVENT) 0.03 % nasal spray 2 spray (not administered)  ipratropium-albuterol (DUONEB) 0.5-2.5 (3) MG/3ML nebulizer solution 3 mL (not administered)  potassium chloride (K-DUR,KLOR-CON) CR tablet 10 mEq (not administered)  levothyroxine (SYNTHROID, LEVOTHROID) tablet 25 mcg (not administered)  losartan (COZAAR) tablet 50 mg (not administered)  memantine (NAMENDA XR) 24 hr capsule  28 mg (not administered)  metoprolol succinate (TOPROL-XL) 24 hr tablet 25 mg (not administered)  mometasone-formoterol (DULERA) 200-5 MCG/ACT inhaler 2 puff (not administered)  nitroGLYCERIN (NITROSTAT) SL tablet 0.4 mg (not administered)  acetaminophen (TYLENOL) tablet 650 mg (not administered)    Or  acetaminophen (TYLENOL) suppository 650 mg (not administered)  ondansetron (ZOFRAN) tablet 4 mg (not administered)    Or  ondansetron (ZOFRAN) injection 4 mg (not administered)  furosemide (LASIX) injection 40 mg (not administered)  albuterol (PROVENTIL) (2.5 MG/3ML) 0.083% nebulizer solution 2.5 mg (not administered)  albuterol (PROVENTIL) (2.5 MG/3ML) 0.083% nebulizer solution 5 mg (5 mg Nebulization Given 03/04/17 1957)  ipratropium (ATROVENT) nebulizer solution 0.5 mg (0.5 mg Nebulization Given 03/04/17 1957)  furosemide (LASIX) injection 40 mg (40 mg Intravenous Given 03/04/17 2154)     Initial Impression / Assessment and Plan / ED Course  I have reviewed the triage vital signs and the nursing notes.  Pertinent labs & imaging results that were available during my care of the patient were reviewed by me and considered in my medical decision making (see chart for details).  Clinical Course as of Mar 04 2344  Wed Mar 04, 2017  2043 On reevaluation patient's clinical status is roughly the same.  Oxygenation varying between 89 and 91% on room air.  Patient's daughter now states that she is too weak to walk with her walker, which she usually  uses, to walk.  Will attempt ambulation trial, while monitoring oxygen saturation.  [EW]    Clinical Course User Index [EW] Daleen Bo, MD     Patient Vitals for the past 24 hrs:  BP Temp Temp src Pulse Resp SpO2 Height Weight  03/04/17 2230 137/79 - - 80 (!) 22 95 % - -  03/04/17 2200 (!) 179/68 - - 94 19 96 % - -  03/04/17 2130 140/64 - - 91 (!) 26 96 % - -  03/04/17 2123 (!) 161/76 - - 87 (!) 21 96 % - -  03/04/17 2100 (!) 142/71 - - 96  (!) 25 (!) 87 % - -  03/04/17 2042 (!) 148/71 - - 95 (!) 24 92 % - -  03/04/17 2000 (!) 146/59 - - 77 (!) 23 99 % - -  03/04/17 1957 - - - - - 91 % - -  03/04/17 1947 139/88 98.6 F (37 C) Oral 87 (!) 24 91 % - -  03/04/17 1810 - - - - - - 5\' 4"  (1.626 m) 71.2 kg (157 lb)  03/04/17 1806 (!) 149/58 98.2 F (36.8 C) Oral 95 18 93 % - -  03/04/17 1758 - - - - - 100 % - -    10:27 PM Reevaluation with update and discussion. After initial assessment and treatment, an updated evaluation reveals she is more comfortable now.  Findings discussed with patient and family members, all questions answered. Daleen Bo     Final Clinical Impressions(s) / ED Diagnoses   Final diagnoses:  Acute on chronic systolic congestive heart failure (HCC)  Hypoxia    Weight gain with heart failure symptoms, complicated by hypoxia.  She will require hospitalization to support oxygenation, and treat acute heart failure.  Doubt ACS, PE or pneumonia.  Troponin mildly elevated, but is lower than recent baseline, indicating improvement.  Mild elevation of BNP from baseline, indicating acute heart failure.  10:29 PM-Consult complete with hospitalist. Patient case explained and discussed.  He agrees to admit patient for further evaluation and treatment. Call ended at 23: 40  Plan: [facility]  ED Discharge Orders    None       Daleen Bo, MD 03/04/17 [phone]

## 2017-03-04 NOTE — Telephone Encounter (Signed)
Spoke with the pt's daughter  She reports had to take pt to [facility] yesterday for continued dyspnea and wheezing  They gave her abx, but did not do a cxr  She feels like she needs to have one done  She was scheduled with BQ for 03/06/17 but wanted sooner ov so I scheduled her with SG for 03/05/17 at 10:30  I advised that if SG decides to do cxr, we can do that same day here  She was advised to seek emergent care sooner if needed

## 2017-03-05 ENCOUNTER — Other Ambulatory Visit: Payer: Self-pay

## 2017-03-05 ENCOUNTER — Telehealth: Payer: Self-pay | Admitting: Interventional Cardiology

## 2017-03-05 ENCOUNTER — Ambulatory Visit: Payer: Medicare Other | Admitting: Acute Care

## 2017-03-05 ENCOUNTER — Encounter (HOSPITAL_COMMUNITY): Payer: Self-pay | Admitting: Radiology

## 2017-03-05 ENCOUNTER — Inpatient Hospital Stay (HOSPITAL_COMMUNITY): Payer: Medicare Other

## 2017-03-05 DIAGNOSIS — R0902 Hypoxemia: Secondary | ICD-10-CM

## 2017-03-05 DIAGNOSIS — I5031 Acute diastolic (congestive) heart failure: Secondary | ICD-10-CM

## 2017-03-05 DIAGNOSIS — J209 Acute bronchitis, unspecified: Secondary | ICD-10-CM

## 2017-03-05 DIAGNOSIS — Z7189 Other specified counseling: Secondary | ICD-10-CM

## 2017-03-05 DIAGNOSIS — Z515 Encounter for palliative care: Secondary | ICD-10-CM

## 2017-03-05 DIAGNOSIS — J440 Chronic obstructive pulmonary disease with acute lower respiratory infection: Secondary | ICD-10-CM

## 2017-03-05 DIAGNOSIS — J441 Chronic obstructive pulmonary disease with (acute) exacerbation: Secondary | ICD-10-CM

## 2017-03-05 DIAGNOSIS — I5033 Acute on chronic diastolic (congestive) heart failure: Secondary | ICD-10-CM

## 2017-03-05 DIAGNOSIS — I482 Chronic atrial fibrillation: Secondary | ICD-10-CM

## 2017-03-05 DIAGNOSIS — E039 Hypothyroidism, unspecified: Secondary | ICD-10-CM

## 2017-03-05 LAB — OCCULT BLOOD X 1 CARD TO LAB, STOOL: Fecal Occult Bld: NEGATIVE

## 2017-03-05 LAB — RESPIRATORY PANEL BY PCR
ADENOVIRUS-RVPPCR: NOT DETECTED
Bordetella pertussis: NOT DETECTED
CHLAMYDOPHILA PNEUMONIAE-RVPPCR: NOT DETECTED
CORONAVIRUS HKU1-RVPPCR: NOT DETECTED
CORONAVIRUS NL63-RVPPCR: NOT DETECTED
CORONAVIRUS OC43-RVPPCR: NOT DETECTED
Coronavirus 229E: NOT DETECTED
Influenza A: NOT DETECTED
Influenza B: NOT DETECTED
METAPNEUMOVIRUS-RVPPCR: NOT DETECTED
Mycoplasma pneumoniae: NOT DETECTED
PARAINFLUENZA VIRUS 1-RVPPCR: NOT DETECTED
PARAINFLUENZA VIRUS 2-RVPPCR: NOT DETECTED
PARAINFLUENZA VIRUS 3-RVPPCR: NOT DETECTED
Parainfluenza Virus 4: NOT DETECTED
RHINOVIRUS / ENTEROVIRUS - RVPPCR: DETECTED — AB
Respiratory Syncytial Virus: NOT DETECTED

## 2017-03-05 LAB — CBC
HEMATOCRIT: 29.6 % — AB (ref 36.0–46.0)
HEMATOCRIT: 32.7 % — AB (ref 36.0–46.0)
Hemoglobin: 9.8 g/dL — ABNORMAL LOW (ref 12.0–15.0)
Hemoglobin: 10.6 g/dL — ABNORMAL LOW (ref 12.0–15.0)
MCH: 29.6 pg (ref 26.0–34.0)
MCH: 29.3 pg (ref 26.0–34.0)
MCHC: 33.1 g/dL (ref 30.0–36.0)
MCHC: 32.4 g/dL (ref 30.0–36.0)
MCV: 89.4 fL (ref 78.0–100.0)
MCV: 90.3 fL (ref 78.0–100.0)
PLATELETS: 176 10*3/uL (ref 150–400)
PLATELETS: 191 10*3/uL (ref 150–400)
RBC: 3.31 MIL/uL — ABNORMAL LOW (ref 3.87–5.11)
RBC: 3.62 MIL/uL — ABNORMAL LOW (ref 3.87–5.11)
RDW: 16.4 % — AB (ref 11.5–15.5)
RDW: 16.3 % — AB (ref 11.5–15.5)
WBC: 5.6 10*3/uL (ref 4.0–10.5)
WBC: 5.3 10*3/uL (ref 4.0–10.5)

## 2017-03-05 LAB — HEMOGLOBIN A1C
HEMOGLOBIN A1C: 6 % — AB (ref 4.8–5.6)
Mean Plasma Glucose: 125.5 mg/dL

## 2017-03-05 LAB — TYPE AND SCREEN
ABO/RH(D): A NEG
ANTIBODY SCREEN: NEGATIVE

## 2017-03-05 LAB — ABO/RH: ABO/RH(D): A NEG

## 2017-03-05 LAB — TSH: TSH: 2.045 u[IU]/mL (ref 0.350–4.500)

## 2017-03-05 LAB — IRON AND TIBC
IRON: 11 ug/dL — AB (ref 28–170)
Saturation Ratios: 6 % — ABNORMAL LOW (ref 10.4–31.8)
TIBC: 178 ug/dL — ABNORMAL LOW (ref 250–450)
UIBC: 167 ug/dL

## 2017-03-05 LAB — BASIC METABOLIC PANEL
Anion gap: 7 (ref 5–15)
BUN: 14 mg/dL (ref 6–20)
CO2: 28 mmol/L (ref 22–32)
CREATININE: 0.73 mg/dL (ref 0.44–1.00)
Calcium: 8.1 mg/dL — ABNORMAL LOW (ref 8.9–10.3)
Chloride: 103 mmol/L (ref 101–111)
GFR calc Af Amer: >60 mL/min (ref >60)
GLUCOSE: 109 mg/dL — AB (ref 65–99)
POTASSIUM: 3.6 mmol/L (ref 3.5–5.1)
Sodium: 138 mmol/L (ref 135–145)

## 2017-03-05 LAB — PROCALCITONIN: PROCALCITONIN: 0.24 ng/mL

## 2017-03-05 LAB — MAGNESIUM: Magnesium: 2 mg/dL (ref 1.7–2.4)

## 2017-03-05 LAB — GLUCOSE, CAPILLARY
Glucose-Capillary: 147 mg/dL — ABNORMAL HIGH (ref 65–99)
Glucose-Capillary: 255 mg/dL — ABNORMAL HIGH (ref 65–99)
Glucose-Capillary: 221 mg/dL — ABNORMAL HIGH (ref 65–99)

## 2017-03-05 LAB — TROPONIN I
TROPONIN I: 0.04 ng/mL — AB (ref <0.03)
Troponin I: 0.06 ng/mL (ref <0.03)
Troponin I: 0.05 ng/mL (ref <0.03)

## 2017-03-05 LAB — VITAMIN B12: Vitamin B-12: 335 pg/mL (ref 180–914)

## 2017-03-05 LAB — RETICULOCYTES
RBC.: 3.19 MIL/uL — AB (ref 3.87–5.11)
RETIC COUNT ABSOLUTE: 60.6 10*3/uL (ref 19.0–186.0)
RETIC CT PCT: 1.9 % (ref 0.4–3.1)

## 2017-03-05 LAB — PROTIME-INR
INR: 2.56
Prothrombin Time: 27.3 seconds — ABNORMAL HIGH (ref 11.4–15.2)

## 2017-03-05 LAB — DIGOXIN LEVEL: DIGOXIN LVL: 0.4 ng/mL — AB (ref 0.8–2.0)

## 2017-03-05 LAB — FOLATE: Folate: 8.1 ng/mL (ref >5.9)

## 2017-03-05 LAB — FERRITIN: FERRITIN: 373 ng/mL — AB (ref 11–307)

## 2017-03-05 MED ORDER — INSULIN ASPART 100 UNIT/ML ~~LOC~~ SOLN
0.0000 [IU] | Freq: Three times a day (TID) | SUBCUTANEOUS | Status: DC
  Administered 2017-03-05: 1 [IU] via SUBCUTANEOUS
  Administered 2017-03-06: 3 [IU] via SUBCUTANEOUS
  Administered 2017-03-06: 5 [IU] via SUBCUTANEOUS
  Administered 2017-03-06: 1 [IU] via SUBCUTANEOUS
  Administered 2017-03-07: 3 [IU] via SUBCUTANEOUS
  Administered 2017-03-07: 2 [IU] via SUBCUTANEOUS

## 2017-03-05 MED ORDER — WARFARIN SODIUM 3 MG PO TABS
3.0000 mg | ORAL_TABLET | Freq: Once | ORAL | Status: AC
  Administered 2017-03-05: 3 mg via ORAL
  Filled 2017-03-05: qty 1

## 2017-03-05 MED ORDER — FUROSEMIDE 10 MG/ML IJ SOLN
40.0000 mg | Freq: Two times a day (BID) | INTRAMUSCULAR | Status: DC
  Administered 2017-03-05: 40 mg via INTRAVENOUS
  Filled 2017-03-05 (×2): qty 4

## 2017-03-05 MED ORDER — METHYLPREDNISOLONE SODIUM SUCC 40 MG IJ SOLR
40.0000 mg | Freq: Three times a day (TID) | INTRAMUSCULAR | Status: DC
  Administered 2017-03-05 – 2017-03-06 (×2): 40 mg via INTRAVENOUS
  Filled 2017-03-05 (×2): qty 1

## 2017-03-05 MED ORDER — PANTOPRAZOLE SODIUM 40 MG IV SOLR
40.0000 mg | Freq: Two times a day (BID) | INTRAVENOUS | Status: DC
Start: 2017-03-05 — End: 2017-03-06
  Administered 2017-03-05 (×2): 40 mg via INTRAVENOUS
  Filled 2017-03-05 (×2): qty 40

## 2017-03-05 MED ORDER — FUROSEMIDE 10 MG/ML IJ SOLN
20.0000 mg | Freq: Two times a day (BID) | INTRAMUSCULAR | Status: DC
  Filled 2017-03-05: qty 2

## 2017-03-05 MED ORDER — IPRATROPIUM-ALBUTEROL 0.5-2.5 (3) MG/3ML IN SOLN
3.0000 mL | Freq: Four times a day (QID) | RESPIRATORY_TRACT | Status: DC
  Administered 2017-03-05 (×2): 3 mL via RESPIRATORY_TRACT
  Filled 2017-03-05 (×2): qty 3

## 2017-03-05 MED ORDER — IPRATROPIUM-ALBUTEROL 0.5-2.5 (3) MG/3ML IN SOLN
3.0000 mL | Freq: Three times a day (TID) | RESPIRATORY_TRACT | Status: DC
  Administered 2017-03-06 – 2017-03-07 (×5): 3 mL via RESPIRATORY_TRACT
  Filled 2017-03-05 (×5): qty 3

## 2017-03-05 MED ORDER — FUROSEMIDE 10 MG/ML IJ SOLN: 20.0000 mg | Freq: Two times a day (BID) | INTRAMUSCULAR | Status: DC

## 2017-03-05 MED ORDER — METHYLPREDNISOLONE SODIUM SUCC 125 MG IJ SOLR
60.0000 mg | Freq: Three times a day (TID) | INTRAMUSCULAR | Status: DC
  Administered 2017-03-05: 60 mg via INTRAVENOUS
  Filled 2017-03-05: qty 2

## 2017-03-05 NOTE — Consult Note (Signed)
Consultation Note Date: 03/05/2017   Patient Name: Amber [name]  DOB: 04/11/1934  MRN: [id number]  Age / Sex: 83 y.o., female  PCP: Antony Contras, MD Referring Physician: Oswald Hillock, MD  Reason for Consultation: Establishing goals of care  HPI/Patient Profile: 83 y.o. female  with past medical history of A-fib (Coumadin and Lopressor), COPD, chronic diastolic CHF (lasix), CAD s/p CABG, dementia, and gout.  She was admitted on 03/04/2017 with shortness of breath and increasing fatigue. Patient was admitted aproximately 2 weeks ago for respiratory failure and pneumonia, requiring bipap. She was said to have rhinovirus and per daughter was taking prednisone, tessalon, with increasing congestion and wheezing.  She is being followed by cardiology and pulmonology.   Clinical Assessment and Goals of Care: Amber [name] is resting comfortably in her bed with daughter at bedside. Her daughter Arville Go) who is an Therapist, sports is her primary caregiver is on the phone during our visit. She is with her son who is having surgery at [facility] due to Crohns. Both daughters are very supportive and care for their mother in the home. Patient does have a congestive cough with some shortness of breath. She is on 2L/[place] and receiving scheduled nebulizing treatments. At this time she does not appear dyspneic. She denies any current pain at this time.  I have reviewed medical records including EPIC notes, labs and imaging, received report from covering nurse, assessed the patient and then met at the bedside along with Elmo Putt, NP and daughter to discuss diagnosis prognosis, GOC, EOL wishes, disposition and options.  I introduced Palliative Medicine as specialized medical care for people living with serious illness. It focuses on providing relief from the symptoms and stress of a serious illness. The goal is to improve quality of life for both the patient and  the family. The daughter Arville Go) stated seemed more aware this visit that her mother's COPD and CHF have been contributing factors to her recent hospitalizations.   We discussed a brief life review of the patient. The patient and the daughter were very pleasant and the patient talked about how sweet her grandson was and adorable he was when he was born. Daughter at bedside is a former Pharmacist, hospital who now stays home to care for mother and also raises and breeds [employer]. They have a company called [employer]. [name] is a spiritual lady and verbalizes with daughters consensus that she has lived an amazing life and knows that the end will come one day and she is prepared for it when it comes. She is full of smiles and kind words of the care that she has received in the past and during this stay here at [facility].   As far as functional and nutritional status Amber [name] states she does well with eating and at times appetite may not be the best. Daughters are staying here with her during her hospitalization and making sure she is eating and ordering meals appropriately. She does have some fatigue and weakness. Requires ambulatory assistance and  wears a brace with orthotic shoes. We discussed her current illness and what it means in the larger context of their on-going co-morbidities. Spent much time discussing diastolic heart disease as the culprit of her recent decline and admissions and natural trajectory.   I attempted to elicit values and goals of care important to the patient.  She is a DNR.  Hopeful for improvement.   Questions and concerns were addressed. The family was encouraged to call with questions or concerns. They were very appreciative of our services and [place] coming to meet with them. Will f/u Sunday, please call for palliative needs prior to Sunday.   Primary Decision Maker:  HCPOA-Daughter Joann     SUMMARY OF RECOMMENDATIONS    Agree with DNR   Continue with current care  plan  Will continue to follow patient/family for further needs during hospitalization and at disposition   Code Status/Advance Care Planning:  DNR    Symptom Management:   Continue with cardiology and pulmonology plans   Palliative Prophylaxis:   Bowel Regimen, Turn Reposition and out of bed with assistance as tolerated  Psycho-social/Spiritual:   Desire for further Chaplaincy support: No  Additional Recommendations: Caregiving  Support/Resources  Prognosis:   Unable to determine  Discharge Planning: To Be Determined      Primary Diagnoses: Present on Admission: . Hypothyroidism . Fibromyalgia . Atrial fibrillation ([place]) . Acute on chronic diastolic (congestive) heart failure ([place])   I have reviewed the medical record, interviewed the patient and family, and examined the patient. The following aspects are pertinent.  Past Medical History:  Diagnosis Date  . Abnormality of gait 08/02/2014  . Cerebrovascular disease 05/21/2016  . Cervical spinal stenosis   . Chronic atrial fibrillation (HCC)    a. chronic/rate controlled;  b. chronic coumadin.  . Chronic diastolic CHF (congestive heart failure) ([place])    a. 02/2013 Echo: EF 55-60%, mild AI, mod dil LA.  . CKD (chronic kidney disease), stage III ([place])   . COPD with emphysema ([place])    PFT 05/02/10>>FEV1 1.35(62%), FEV1% 66, DLCO 75%  . Coronary artery disease    a. s/p CABG;  b. Abnormal nuc 2015 - managed medically.  . CVA (cerebral vascular accident) ([place])    left sided weakness  . Dementia   . Depression   . Fibromyalgia   . GERD (gastroesophageal reflux disease)    pepcid 2-3 times per week  . Gout   . Hemiparesis and alteration of sensations as late effects of stroke ([place]) 01/17/2015  . History of melanoma    squamous cell, melanoma  . Hyperlipidemia    a. statin intolerant, not felt to be candidate for PCSK9 due to chronically elevated CK levels.  . Hypertension   . Hypertensive heart disease   .  Hypothyroidism   . Insomnia   . Intermittent confusion   . Memory change 01/23/2014  . Nasal polyposis   . Osteoarthritis   . Osteoporosis   . Pneumonia    1990  . Tobacco abuse    Social History   Socioeconomic History  . Marital status: Married    Spouse name: None  . Number of children: 2  . Years of education: None  . Highest education level: None  Social Needs  . Financial resource strain: None  . Food insecurity - worry: None  . Food insecurity - inability: None  . Transportation needs - medical: None  . Transportation needs - non-medical: None  Occupational History  . Occupation: Retired  Employer: [employer]  . Occupation: Network engineer    Comment: Retired  Tobacco Use  . Smoking status: Former Smoker    Packs/day: 1.50    Years: 25.00    Pack years: 37.50    Types: Cigarettes    Last attempt to quit: 02/10/1977    Years since quitting: 40.0  . Smokeless tobacco: Never Used  Substance and Sexual Activity  . Alcohol use: No  . Drug use: No  . Sexual activity: None  Other Topics Concern  . None  Social History Narrative   Patient is right handed.   Patient lives at home with husband   Patient drinks 8 oz caffeine occasionally.   Family History  Problem Relation Age of Onset  . Stroke Mother   . CAD Father   . Diabetes type II Brother   . CAD Brother    Scheduled Meds: . allopurinol  300 mg Oral Daily  . amitriptyline  20 mg Oral QHS  . benzonatate  100 mg Oral TID  . bisacodyl  10 mg Oral Daily  . citalopram  20 mg Oral BID  . clopidogrel  75 mg Oral Daily  . [START ON 03/06/2017] digoxin  0.125 mg Oral Q M,W,F  . donepezil  10 mg Oral Daily  . doxycycline  100 mg Oral BID  . famotidine  20 mg Oral Daily  . furosemide  20 mg Intravenous Q12H  . guaiFENesin  1,200 mg Oral BID  . insulin aspart  0-9 Units Subcutaneous TID WC  . ipratropium  2 spray Nasal TID  . ipratropium-albuterol  3 mL Nebulization Q6H  . levothyroxine  25 mcg Oral  QAC breakfast  . memantine  28 mg Oral Daily  . methylPREDNISolone (SOLU-MEDROL) injection  60 mg Intravenous Q8H  . metoprolol succinate  25 mg Oral Daily  . mometasone-formoterol  2 puff Inhalation BID  . pantoprazole (PROTONIX) IV  40 mg Intravenous Q12H  . potassium chloride SA  10 mEq Oral Daily  . warfarin  3 mg Oral ONCE-1800  . Warfarin - Pharmacist Dosing Inpatient   Does not apply q1800   Continuous Infusions: PRN Meds:.acetaminophen **OR** acetaminophen, albuterol, nitroGLYCERIN, ondansetron **OR** ondansetron (ZOFRAN) IV Medications Prior to Admission:  Prior to Admission medications   Medication Sig Start Date End Date Taking? Authorizing Provider  allopurinol (ZYLOPRIM) 300 MG tablet Take 1 tablet (300 mg total) by mouth every other day. Patient taking differently: Take 300 mg by mouth daily.  03/06/16  Yes Burtis Junes, NP  amitriptyline (ELAVIL) 10 MG tablet TAKE 2 TABLETS BY MOUTH AT BEDTIME Patient taking differently: TAKE 106m  BY MOUTH AT BEDTIME 09/01/16  Yes WKathrynn Ducking MD  amLODipine (NORVASC) 5 MG tablet Take 1 tablet (5 mg total) by mouth at bedtime. 01/12/17  Yes SBelva Crome MD  benzonatate (TESSALON) 100 MG capsule Take 1 capsule (100 mg total) by mouth 3 (three) times daily. 02/17/17  Yes MGeorgette Shell MD  bisacodyl (DULCOLAX) 5 MG EC tablet Take 2 tablets (10 mg total) by mouth daily. 02/18/17  Yes MGeorgette Shell MD  celecoxib (CELEBREX) 200 MG capsule Take 200 mg by mouth every morning.    Yes [provider]  citalopram (CELEXA) 20 MG tablet TAKE 1 TABLET BY MOUTH TWICE A DAY 11/09/14  Yes BDarlin Coco MD  clopidogrel (PLAVIX) 75 MG tablet Take 75 mg by mouth daily. 02/04/17  Yes [provider]  COUMADIN 5 MG tablet TAKE AS DIRECTED  BY COUMADIN CLINIC. Hold coumadin November 24 th and November 25 th.  Recheck INR on Monday. Patient taking differently: Take 5-7.5 mg by mouth daily. Take 5 mg everyday except 7.5 mg  on Tuesday and Saturday 01/03/17  Yes Hosie Poisson, MD  digoxin (LANOXIN) 0.125 MG tablet Take 1 tablet (0.125 mg total) by mouth on Mondays, Wednesdays, & Fridays 03/27/16  Yes Belva Crome, MD  donepezil (ARICEPT) 10 MG tablet Take 1 tablet (10 mg total) by mouth at bedtime. Patient taking differently: Take 10 mg by mouth daily.  06/13/16  Yes Kathrynn Ducking, MD  doxycycline (VIBRAMYCIN) 100 MG capsule Take 100 mg by mouth 2 (two) times daily. 03/03/17  Yes [provider]  ergocalciferol (VITAMIN D2) 50000 UNITS capsule Take 50,000 Units by mouth 2 (two) times a week. Tuesday and Friday   Yes [provider]  famotidine (PEPCID) 20 MG tablet Take 1 tablet (20 mg total) by mouth daily. 02/18/17  Yes Georgette Shell, MD  furosemide (LASIX) 40 MG tablet Take 1 tablet (40 mg total) by mouth daily. 02/24/17  Yes Belva Crome, MD  guaiFENesin (MUCINEX) 600 MG 12 hr tablet Take 2 tablets (1,200 mg total) by mouth 2 (two) times daily. 02/17/17  Yes Georgette Shell, MD  ipratropium (ATROVENT) 0.03 % nasal spray Place 2 sprays into the nose 3 (three) times daily. 02/17/17  Yes Georgette Shell, MD  ipratropium-albuterol (DUONEB) 0.5-2.5 (3) MG/3ML SOLN Take 3 mLs by nebulization 3 (three) times daily. 02/24/17  Yes McQuaid, Ronie Spies, MD  KLOR-CON M20 20 MEQ tablet Take 0.5 tablets (10 mEq total) by mouth daily. 02/17/17  Yes Georgette Shell, MD  levothyroxine (SYNTHROID, LEVOTHROID) 25 MCG tablet Take 25 mcg by mouth every morning.    Yes [provider]  losartan (COZAAR) 50 MG tablet Take 1 tablet (50 mg total) by mouth daily. 01/27/17 01/22/18 Yes Belva Crome, MD  memantine (NAMENDA XR) 28 MG CP24 24 hr capsule TAKE ONE CAPSULE BY MOUTH EVERY DAY 07/15/16  Yes Kathrynn Ducking, MD  metoprolol succinate (TOPROL-XL) 25 MG 24 hr tablet TAKE 1 TABLET (25 MG TOTAL) BY MOUTH DAILY. Patient taking differently: TAKE 1 TABLET (25 MG TOTAL) BY MOUTH DAILY. Morning 05/16/16   Yes Belva Crome, MD  mometasone-formoterol [facility]) 200-5 MCG/ACT AERO Inhale 2 puffs into the lungs 2 (two) times daily.   Yes [provider]  nitroGLYCERIN (NITROSTAT) 0.4 MG SL tablet Place 1 tablet (0.4 mg total) under the tongue every 5 (five) minutes as needed for chest pain. 09/13/12  Yes Darlin Coco, MD  PROAIR HFA 108 (90 BASE) MCG/ACT inhaler INHALE 2 PUFFS INTO LUNGS EVERY 6 HOURS AS NEEDED FOR WHEEZING 12/22/13  Yes Darlin Coco, MD  fluticasone (FLONASE) 50 MCG/ACT nasal spray Place 2 sprays into both nostrils 2 (two) times daily. Patient not taking: Reported on 03/04/2017 02/17/17   Georgette Shell, MD  polyethylene glycol [facility] Floria Raveling) packet Take 17 g by mouth daily. Patient not taking: Reported on 03/04/2017 02/18/17   Georgette Shell, MD   Allergies  Allergen Reactions  . Ezetimibe Other (See Comments)    Myalgia  . Welchol [Colesevelam Hcl] Other (See Comments)    Muscle aches  . Adhesive [Tape] Itching, Rash and Other (See Comments)    Burning   . Ceclor [Cefaclor] Other (See Comments)    Unknown allergic reaction  . Elastic Bandages & [Zinc] Rash and Other (See Comments)  Turns red on the areas it touches  . Latex Itching, Rash and Other (See Comments)    Burning   . Lipitor [Atorvastatin Calcium] Other (See Comments)    Increased fibromyalgia pain  . Mevacor [Lovastatin] Other (See Comments)    Increased fibromyalgia pain  . Pravachol Other (See Comments)    Increased fibromyalgia pain  . Vasotec Other (See Comments)    Unknown allergic reaction   Review of Systems  Weakness, shortness of breath, + cough and congestion, + wheezing, 2L/[place]  Physical Exam  Frail and weak appearing, Hx of dementia  audible wheezing, congestive cough   Vital Signs: BP (!) 166/67   Pulse 74   Temp 98.3 F (36.8 C) (Oral)   Resp 20   Ht 5' 4" (1.626 m)   Wt 71.3 kg (157 lb 3 oz)   SpO2 96%   BMI 26.98 kg/m  Pain Assessment: No/denies  pain   Pain Score: 0-No pain   SpO2: SpO2: 96 % O2 Device:SpO2: 96 % O2 Flow Rate: .O2 Flow Rate (L/min): 2 L/min  IO: Intake/output summary:   Intake/Output Summary (Last 24 hours) at 03/05/2017 1451 Last data filed at 03/05/2017 1147 Gross per 24 hour  Intake -  Output 600 ml  Net -600 ml    LBM: Last BM Date: 03/04/17 Baseline Weight: Weight: 71.2 kg (157 lb) Most recent weight: Weight: 71.3 kg (157 lb 3 oz)     Palliative Assessment/Data: PPS 60%    Time Total: 70 min  Greater than 50%  of this time was spent counseling and coordinating care related to the above assessment and plan.  Signed by: Alda Lea, AGNP-C Palliative Medicine Team  Phone: [phone] Fax: [phone]  Please contact Palliative Medicine Team phone at [phone] for questions and concerns.  For individual provider: See Malachi Paradise, NP Palliative Medicine Team Pager # [phone] (M-F 8a-5p) Team Phone # [phone] (Nights/Weekends)

## 2017-03-05 NOTE — Care Management Note (Signed)
Case Management Note  Patient Details  Name: Amber [name] MRN: [id number] Date of Birth: [birth year]  Subjective/Objective:  Pt admitted with CHF, SOB                  Action/Plan: Plan to discharge home with [place]   Expected Discharge Date:                  Expected Discharge Plan:  [place]  In-House Referral:  Clinical Social Work  Discharge planning Services  CM Consult  Post Acute Care Choice:    Choice offered to:  Patient  DME Arranged:    DME Agency:     HH Arranged:  RN, PT, OT, Nurse's Aide [place] Agency:  [place]  Status of Service:  In process, will continue to follow  If discussed at Long Length of Stay Meetings, dates discussed:    Additional CommentsPurcell Mouton, RN 03/05/2017, 1:07 PM

## 2017-03-05 NOTE — Consult Note (Signed)
Name: Amber [name] MRN: [id number] DOB: Jan 18, 1935    ADMISSION DATE:  03/04/2017 CONSULTATION DATE:  03/05/17  REFERRING MD :  Dr. Darrick Meigs / [facility]   CHIEF COMPLAINT:  Progressive shortness of breath    HISTORY OF PRESENT ILLNESS:  83 y/o F admitted on 1/23 with reports of progressive shortness of breath.    The patient was recently admitted from 12/28-1/8 for hypoxic respiratory failure with concern for possible pneumonia, COPD, bronchitis, CHF (per the discharge note review).  She was also treated for GERD.  Course notable for heme positive stool (on Coumadin for AF). MBS completed 1/8 demonstrated mild aspiration risk.  At that time, she was discharged on 20 mg lasix QD with plans for follow up with her PCP, Pulmonary & Cardiology. Her discharge weight recorded was 156lbs.    She was seen at an urgent care on 1/22 and given antibiotics but a CXR was not performed.  Appt was arranged for follow up at Pulmonary for 1/25.  The patient's daughter called Cardiology with similar concerns of dyspnea and reported that she was up 7lbs.  She returned to [facility] on 1/23 with progressive shortness of breath.  She was admitted per [facility]. The patient fell two days prior to admit. CT of the head was negative (pt is on anticoagulation for AF).   CXR was concerning for CHF.   Cardiology consulted.  I/O documentation does not appear to be correct (no intake, 600 ml output).  Pulmonary consulted 1/24 for evaluation of dyspnea.    PAST MEDICAL HISTORY :   has a past medical history of Abnormality of gait (08/02/2014), Cerebrovascular disease (05/21/2016), Cervical spinal stenosis, Chronic atrial fibrillation (HCC), Chronic diastolic CHF (congestive heart failure) ([place]), CKD (chronic kidney disease), stage III ([place]), COPD with emphysema ([place]), Coronary artery disease, CVA (cerebral vascular accident) ([place]), Dementia, Depression, Fibromyalgia, GERD (gastroesophageal reflux disease), Gout, Hemiparesis and alteration of  sensations as late effects of stroke ([place]) (01/17/2015), History of melanoma, Hyperlipidemia, Hypertension, Hypertensive heart disease, Hypothyroidism, Insomnia, Intermittent confusion, Memory change (01/23/2014), Nasal polyposis, Osteoarthritis, Osteoporosis, Pneumonia, and Tobacco abuse.   has a past surgical history that includes Abdominal hysterectomy; Breast lumpectomy (1980s); carpel tunnel; Tonsillectomy; Eye surgery; Coronary artery bypass graft (2000); Radiology with anesthesia (12/24/2011); Radiology with anesthesia (N/A, 07/19/2014); Radiology with anesthesia (N/A, 07/27/2014); and ir generic historical (03/31/2016).  Prior to Admission medications   Medication Sig Start Date End Date Taking? Authorizing Provider  allopurinol (ZYLOPRIM) 300 MG tablet Take 1 tablet (300 mg total) by mouth every other day. Patient taking differently: Take 300 mg by mouth daily.  03/06/16  Yes Burtis Junes, NP  amitriptyline (ELAVIL) 10 MG tablet TAKE 2 TABLETS BY MOUTH AT BEDTIME Patient taking differently: TAKE 20mg   BY MOUTH AT BEDTIME 09/01/16  Yes Kathrynn Ducking, MD  amLODipine (NORVASC) 5 MG tablet Take 1 tablet (5 mg total) by mouth at bedtime. 01/12/17  Yes Belva Crome, MD  benzonatate (TESSALON) 100 MG capsule Take 1 capsule (100 mg total) by mouth 3 (three) times daily. 02/17/17  Yes Georgette Shell, MD  bisacodyl (DULCOLAX) 5 MG EC tablet Take 2 tablets (10 mg total) by mouth daily. 02/18/17  Yes Georgette Shell, MD  celecoxib (CELEBREX) 200 MG capsule Take 200 mg by mouth every morning.    Yes [provider]  citalopram (CELEXA) 20 MG tablet TAKE 1 TABLET BY MOUTH TWICE A DAY 11/09/14  Yes Darlin Coco, MD  clopidogrel (PLAVIX) 75 MG tablet  Take 75 mg by mouth daily. 02/04/17  Yes [provider]  COUMADIN 5 MG tablet TAKE AS DIRECTED BY COUMADIN CLINIC. Hold coumadin November 24 th and November 25 th.  Recheck INR on Monday. Patient taking differently: Take 5-7.5  mg by mouth daily. Take 5 mg everyday except 7.5 mg on Tuesday and Saturday 01/03/17  Yes Hosie Poisson, MD  digoxin (LANOXIN) 0.125 MG tablet Take 1 tablet (0.125 mg total) by mouth on Mondays, Wednesdays, & Fridays 03/27/16  Yes Belva Crome, MD  donepezil (ARICEPT) 10 MG tablet Take 1 tablet (10 mg total) by mouth at bedtime. Patient taking differently: Take 10 mg by mouth daily.  06/13/16  Yes Kathrynn Ducking, MD  doxycycline (VIBRAMYCIN) 100 MG capsule Take 100 mg by mouth 2 (two) times daily. 03/03/17  Yes [provider]  ergocalciferol (VITAMIN D2) 50000 UNITS capsule Take 50,000 Units by mouth 2 (two) times a week. Tuesday and Friday   Yes [provider]  famotidine (PEPCID) 20 MG tablet Take 1 tablet (20 mg total) by mouth daily. 02/18/17  Yes Georgette Shell, MD  furosemide (LASIX) 40 MG tablet Take 1 tablet (40 mg total) by mouth daily. 02/24/17  Yes Belva Crome, MD  guaiFENesin (MUCINEX) 600 MG 12 hr tablet Take 2 tablets (1,200 mg total) by mouth 2 (two) times daily. 02/17/17  Yes Georgette Shell, MD  ipratropium (ATROVENT) 0.03 % nasal spray Place 2 sprays into the nose 3 (three) times daily. 02/17/17  Yes Georgette Shell, MD  ipratropium-albuterol (DUONEB) 0.5-2.5 (3) MG/3ML SOLN Take 3 mLs by nebulization 3 (three) times daily. 02/24/17  Yes McQuaid, Ronie Spies, MD  KLOR-CON M20 20 MEQ tablet Take 0.5 tablets (10 mEq total) by mouth daily. 02/17/17  Yes Georgette Shell, MD  levothyroxine (SYNTHROID, LEVOTHROID) 25 MCG tablet Take 25 mcg by mouth every morning.    Yes [provider]  losartan (COZAAR) 50 MG tablet Take 1 tablet (50 mg total) by mouth daily. 01/27/17 01/22/18 Yes Belva Crome, MD  memantine (NAMENDA XR) 28 MG CP24 24 hr capsule TAKE ONE CAPSULE BY MOUTH EVERY DAY 07/15/16  Yes Kathrynn Ducking, MD  metoprolol succinate (TOPROL-XL) 25 MG 24 hr tablet TAKE 1 TABLET (25 MG TOTAL) BY MOUTH DAILY. Patient taking differently: TAKE 1  TABLET (25 MG TOTAL) BY MOUTH DAILY. Morning 05/16/16  Yes Belva Crome, MD  mometasone-formoterol [facility]) 200-5 MCG/ACT AERO Inhale 2 puffs into the lungs 2 (two) times daily.   Yes [provider]  nitroGLYCERIN (NITROSTAT) 0.4 MG SL tablet Place 1 tablet (0.4 mg total) under the tongue every 5 (five) minutes as needed for chest pain. 09/13/12  Yes Darlin Coco, MD  PROAIR HFA 108 (90 BASE) MCG/ACT inhaler INHALE 2 PUFFS INTO LUNGS EVERY 6 HOURS AS NEEDED FOR WHEEZING 12/22/13  Yes Darlin Coco, MD  fluticasone (FLONASE) 50 MCG/ACT nasal spray Place 2 sprays into both nostrils 2 (two) times daily. Patient not taking: Reported on 03/04/2017 02/17/17   Georgette Shell, MD  polyethylene glycol [facility] / Floria Raveling) packet Take 17 g by mouth daily. Patient not taking: Reported on 03/04/2017 02/18/17   Georgette Shell, MD    Allergies  Allergen Reactions  . Ezetimibe Other (See Comments)    Myalgia  . Welchol [Colesevelam Hcl] Other (See Comments)    Muscle aches  . Adhesive [Tape] Itching, Rash and Other (See Comments)    Burning   . Ceclor [Cefaclor]  Other (See Comments)    Unknown allergic reaction  . Elastic Bandages & [Zinc] Rash and Other (See Comments)    Turns red on the areas it touches  . Latex Itching, Rash and Other (See Comments)    Burning   . Lipitor [Atorvastatin Calcium] Other (See Comments)    Increased fibromyalgia pain  . Mevacor [Lovastatin] Other (See Comments)    Increased fibromyalgia pain  . Pravachol Other (See Comments)    Increased fibromyalgia pain  . Vasotec Other (See Comments)    Unknown allergic reaction    FAMILY HISTORY:  family history includes CAD in her brother and father; Diabetes type II in her brother; Stroke in her mother.  SOCIAL HISTORY:  reports that she quit smoking about 40 years ago. Her smoking use included cigarettes. She has a 37.50 pack-year smoking history. she has never used smokeless tobacco. She reports  that she does not drink alcohol or use drugs.  REVIEW OF SYSTEMS:  POSITIVES IN BOLD Constitutional: Negative for fever, chills, weight loss, malaise/fatigue and diaphoresis.  HENT: Negative for hearing loss, ear pain, nosebleeds, congestion, sore throat, neck pain, tinnitus and ear discharge.   Eyes: Negative for blurred vision, double vision, photophobia, pain, discharge and redness.  Respiratory: Negative for cough, hemoptysis, sputum production, shortness of breath, wheezing and stridor.   Cardiovascular: Negative for chest pain, palpitations, orthopnea, claudication, leg swelling and PND.  Gastrointestinal: Negative for heartburn, nausea, vomiting, abdominal pain, diarrhea, constipation, blood in stool and melena.  Genitourinary: Negative for dysuria, urgency, frequency, hematuria and flank pain.  Musculoskeletal: Negative for myalgias, back pain, joint pain and falls.  Skin: Negative for itching and rash.  Neurological: Negative for dizziness, tingling, tremors, sensory change, speech change, focal weakness, seizures, loss of consciousness, weakness and headaches.  Endo/Heme/Allergies: Negative for environmental allergies and polydipsia. Does not bruise/bleed easily.  SUBJECTIVE:   VITAL SIGNS: Temp:  [98.2 F (36.8 C)-99.2 F (37.3 C)] 98.3 F (36.8 C) (01/24 1000) Pulse Rate:  [74-96] 74 (01/24 1139) Resp:  [18-26] 20 (01/24 1000) BP: (98-179)/(54-88) 166/67 (01/24 1139) SpO2:  [87 %-100 %] 96 % (01/24 1441) Weight:  [157 lb (71.2 kg)-157 lb 10.1 oz (71.5 kg)] 157 lb 3 oz (71.3 kg) (01/24 0500)  PHYSICAL EXAMINATION: General:  Elderly, no distress,  HEENT: MM pink/moist PSY: normal affect Neuro: alert, interactive, non focal CV: s1s2 rrr, no m/r/g PULM: even/non-labored, lungs bilaterally scattered rhonchi XV:QMGQ, non-tender, bsx4 active  Extremities: warm/dry, no edema  Skin: no rashes or lesions  Recent Labs  Lab 03/04/17 1849 03/05/17 0422  NA 137 138  K 3.6  3.6  CL 104 103  CO2 25 28  BUN 14 14  CREATININE 0.75 0.73  GLUCOSE 106* 109*    Recent Labs  Lab 03/04/17 1849 03/05/17 0422 03/05/17 1233  HGB 10.4* 9.8* 10.6*  HCT 32.3* 29.6* 32.7*  WBC 5.7 5.6 5.3  PLT 162 176 191    Dg Chest 2 View  Result Date: 03/04/2017 CLINICAL DATA:  Dyspnea and cough starting yesterday EXAM: CHEST  2 VIEW COMPARISON:  02/21/2016 FINDINGS: Status post CABG with borderline cardiomegaly. Aortic atherosclerosis is noted. New bilateral left greater than right pleural effusions with pulmonary vascular redistribution and bilateral airspace opacities are consistent with mild to moderate CHF. Superimposed pneumonia would be difficult to entirely exclude but given the bilaterality and diffuse distribution of the airspace opacities, findings are more in keeping with CHF. No acute osseous abnormality. Degenerative changes are noted along the dorsal  spine. IMPRESSION: CHF with bilateral left greater than right pleural effusions. Status post CABG with aortic atherosclerosis. Electronically Signed   By: Ashley Royalty M.D.   On: 03/04/2017 19:14   Ct Head Wo Contrast  Result Date: 03/05/2017 CLINICAL DATA:  Fall 2 days ago. EXAM: CT HEAD WITHOUT CONTRAST TECHNIQUE: Contiguous axial images were obtained from the base of the skull through the vertex without intravenous contrast. COMPARISON:  02/06/2017 FINDINGS: Brain: There is atrophy and chronic small vessel disease changes. No acute intracranial abnormality. Specifically, no hemorrhage, hydrocephalus, mass lesion, acute infarction, or significant intracranial injury. Vascular: No hyperdense vessel or unexpected calcification. Skull: No acute calvarial abnormality. Sinuses/Orbits: Small air-fluid level in the right maxillary sinus. Remainder the paranasal sinuses are clear. Mastoid air cells are clear. Orbital soft tissues unremarkable. Other: None IMPRESSION: No acute intracranial abnormality. Atrophy, chronic microvascular  disease. Small air-fluid level in the right maxillary sinus may reflect acute sinusitis. Electronically Signed   By: Rolm Baptise M.D.   On: 03/05/2017 08:21    SIGNIFICANT EVENTS  1/23  Admit with progressive SOB 1/24  PCCM consulted    STUDIES:  ECHO 02/09/17 >> LVEF 60-65%, mild AV stenosis, LA moderately dilated, moderate TR, PA peak pressure 52   CULTURES RVP 1/23 >> Rhinovirus positive  UC 1/24 >>   ANTIBIOTICS   ASSESSMENT / PLAN:  Discussion:  83 y/o F admitted 1/23 with progressive SOB.  Recent admit from 12/28-1/8 for hypoxic respiratory failure that appeared to be multifactorial in nature.  Diuretics were adjusted at discharge (reduced).       Noe Gens, NP-C [facility] Pgr: [phone] or if no answer [phone]   Independently examined pt, evaluated data & formulated above care plan with NP/resident   Chest x-ray suggests acute pulmonary edema, however exam shows bilateral rhonchi and no evidence of fluid overload-more suggestive of COPD exacerbation. PFTs from the past show airway obstruction but she seems to have a less than 10 pack year history of smoking and quit in 1979 Echo shows moderate pulmonary hypertension and normal valves  Recommend-treat with diuretics for pulmonary edema and IV Solu-Medrol 40 every 8 as COPD exacerbation will titrate down as bronchospasm resolves Given her recent prior admission and persistent bronchospasm, may need longer steroid taper this time Will follow with you  Rakesh V. Elsworth Soho MD 03/05/2017, 3:34 PM

## 2017-03-05 NOTE — Progress Notes (Addendum)
ANTICOAGULATION CONSULT NOTE - [place] for Warfarin Indication: atrial fibrillation, hx of CVA  Allergies  Allergen Reactions  . Ezetimibe Other (See Comments)    Myalgia  . Welchol [Colesevelam Hcl] Other (See Comments)    Muscle aches  . Adhesive [Tape] Itching, Rash and Other (See Comments)    Burning   . Ceclor [Cefaclor] Other (See Comments)    Unknown allergic reaction  . Elastic Bandages & [Zinc] Rash and Other (See Comments)    Turns red on the areas it touches  . Latex Itching, Rash and Other (See Comments)    Burning   . Lipitor [Atorvastatin Calcium] Other (See Comments)    Increased fibromyalgia pain  . Mevacor [Lovastatin] Other (See Comments)    Increased fibromyalgia pain  . Pravachol Other (See Comments)    Increased fibromyalgia pain  . Vasotec Other (See Comments)    Unknown allergic reaction    Patient Measurements: Height: 5\' 4"  (162.6 cm) Weight: 157 lb 3 oz (71.3 kg) IBW/kg (Calculated) : 54.7   Vital Signs: Temp: 98.3 F (36.8 C) (01/24 1000) Temp Source: Oral (01/24 1000) BP: 166/67 (01/24 1139) Pulse Rate: 74 (01/24 1139)  Labs: Recent Labs    03/04/17 1849 03/05/17 0422 03/05/17 0716 03/05/17 1233  HGB 10.4* 9.8*  --  10.6*  HCT 32.3* 29.6*  --  32.7*  PLT 162 176  --  191  LABPROT 26.4* 27.3*  --   --   INR 2.45 2.56  --   --   CREATININE 0.75 0.73  --   --   TROPONINI 0.05*  --  0.06*  --     Estimated Creatinine Clearance: 54.3 mL/min (by C-G formula based on SCr of 0.73 mg/dL).   Medical History: Past Medical History:  Diagnosis Date  . Abnormality of gait 08/02/2014  . Cerebrovascular disease 05/21/2016  . Cervical spinal stenosis   . Chronic atrial fibrillation (HCC)    a. chronic/rate controlled;  b. chronic coumadin.  . Chronic diastolic CHF (congestive heart failure) ([place])    a. 02/2013 Echo: EF 55-60%, mild AI, mod dil LA.  . CKD (chronic kidney disease), stage III ([place])   . COPD with  emphysema ([place])    PFT 05/02/10>>FEV1 1.35(62%), FEV1% 66, DLCO 75%  . Coronary artery disease    a. s/p CABG;  b. Abnormal nuc 2015 - managed medically.  . CVA (cerebral vascular accident) ([place])    left sided weakness  . Dementia   . Depression   . Fibromyalgia   . GERD (gastroesophageal reflux disease)    pepcid 2-3 times per week  . Gout   . Hemiparesis and alteration of sensations as late effects of stroke ([place]) 01/17/2015  . History of melanoma    squamous cell, melanoma  . Hyperlipidemia    a. statin intolerant, not felt to be candidate for PCSK9 due to chronically elevated CK levels.  . Hypertension   . Hypertensive heart disease   . Hypothyroidism   . Insomnia   . Intermittent confusion   . Memory change 01/23/2014  . Nasal polyposis   . Osteoarthritis   . Osteoporosis   . Pneumonia    1990  . Tobacco abuse     Medications:  Scheduled:  . allopurinol  300 mg Oral Daily  . amitriptyline  20 mg Oral QHS  . benzonatate  100 mg Oral TID  . bisacodyl  10 mg Oral Daily  . citalopram  20 mg Oral  BID  . clopidogrel  75 mg Oral Daily  . [START ON 03/06/2017] digoxin  0.125 mg Oral Q M,W,F  . donepezil  10 mg Oral Daily  . doxycycline  100 mg Oral BID  . famotidine  20 mg Oral Daily  . furosemide  20 mg Intravenous Q12H  . guaiFENesin  1,200 mg Oral BID  . insulin aspart  0-9 Units Subcutaneous TID WC  . ipratropium  2 spray Nasal TID  . ipratropium-albuterol  3 mL Nebulization Q6H  . levothyroxine  25 mcg Oral QAC breakfast  . memantine  28 mg Oral Daily  . methylPREDNISolone (SOLU-MEDROL) injection  60 mg Intravenous Q8H  . metoprolol succinate  25 mg Oral Daily  . mometasone-formoterol  2 puff Inhalation BID  . pantoprazole (PROTONIX) IV  40 mg Intravenous Q12H  . potassium chloride SA  10 mEq Oral Daily  . Warfarin - Pharmacist Dosing Inpatient   Does not apply q1800    Assessment: 82 y/oF on chronic warfarin at home for atrial fibrillation and history of  CVA admitted with c/o shortness of breath, fatigue, wheezing, and productive cough. Pharmacy consulted to dose warfarin while patient in the hospital. Home dose of warfarin reported as 5 mg daily except 7.5 mg on Tuesdays and Saturdays with last dose 1/22 at 0830. INR threapeutic on admission.   Today, 03/05/17:   INR = 2.56 (note that 5mg  dose given at 0144 overnight)  CBC: Hgb 10.6, Pltc WNL  RN reports low PO intake  Drug interactions: Doxycycline, steroids, plavix  CT of head: No acute intracranial abnormality  FOBT negative this AM  Patient's daughter reports patient having some blood tinged sputum prior to coming into hospital, none since admission  No bleeding issues reported currently  Goal of Therapy:  INR 2-3   Plan:   Warfarin 3mg  PO x 1 tonight   Daily PT/INR  CBC in AM  Monitor closely for s/sx of bleeding   Lindell Spar, PharmD, BCPS Pager: [phone] 03/05/2017 1:23 PM

## 2017-03-05 NOTE — Progress Notes (Addendum)
[facility]  PROGRESS NOTE  Amber [name] JME:[id number] DOB: 1934-08-13 DOA: 03/04/2017 PCP: Antony Contras, MD   Brief HPI:   83 y.o. female with history of diastolic CHF, COPD, chronic atrial fibrillation, CAD, gout was brought to the ER after patient was found to be having increasing fatigue and shortness of breath.  Patient was just recently admitted 3 weeks ago for respiratory failure and at that time was treated for CHF and pneumonia.  Patient following which has had follow-up with cardiology and pulmonologist.  Patient at discharge was on Lasix 20 mg which was increased back to 40 mg by cardiologist shortly after discharge.  Denies any chest pain has been having some wheezing and productive cough for which patient had gone to urgent care center 2 days ago and was prescribed doxycycline.  Since patient symptoms was gradually and progressively worsening patient was brought to the ER.     Subjective   Patient seen and examined, denies chest pain or shortness of breath. Continues to have cough.   Assessment/Plan:     1. Acute on chronic diastolic CHF exacerbation- patient presents with BNP of 358.9, mild elevation of troponin 0.05, 0.06. She received Lasix 40 mg IV last night, with urine output around 600. She is currently not in severe exacerbation, will change Lasix to 20 mg IV every 12 hours. Will consult cardiology for further recommendations. 2. Acute bronchitis/COPD exacerbation- patient has significant rhonchi, has been coughing up phlegm. She was discharged on prednisone taper during previous admission. Respiratory panel positive for rhinovirus/enterovirus. Will start Solu-Medrol 60 mg IV every 8 hours. Continue Mucinex 1 tablet by mouth twice a day, DuoNeb nebulizers every 6 hours. Continue doxycycline 3. Atrial fibrillation- heart rate is controlled, continue metoprolol, Coumadin per pharmacy consultation. 4. Normocytic normochromic anemia- FOBT is negative, hemoglobin is  stable at 9.8. Will recheck CBC in a.m. 5. CAD-mild elevation of troponin, continue Plavix, Toprol-XL. Patient is intolerant to statins. 6. Hypothyroidism-continue Synthroid 7. Recent fall-CT head was negative for bleed 8. History of gout-continue allopurinol    DVT prophylaxis: Coumadin  Code Status: DO NOT RESUSCITATE  Family Communication: Discussed with patient's daughter at bedside and another daughter on phone   Disposition Plan: Likely home in next 3-4 days   Consultants:  None  Procedures:  None  Continuous infusions     Antibiotics:   Anti-infectives (From admission, onward)   Start     Dose/Rate Route Frequency Ordered Stop   03/04/17 2345  doxycycline (VIBRA-TABS) tablet 100 mg     100 mg Oral 2 times daily 03/04/17 2336         Objective   Vitals:   03/05/17 0500 03/05/17 0852 03/05/17 1000 03/05/17 1139  BP: 98/73  136/61 (!) 166/67  Pulse: 90  80 74  Resp: 18  20   Temp: 98.6 F (37 C)  98.3 F (36.8 C)   TempSrc: Oral  Oral   SpO2: 95% 96% 98%   Weight: 71.3 kg (157 lb 3 oz)     Height:        Intake/Output Summary (Last 24 hours) at 03/05/2017 1339 Last data filed at 03/05/2017 1147 Gross per 24 hour  Intake -  Output 600 ml  Net -600 ml   Filed Weights   03/04/17 1810 03/05/17 0039 03/05/17 0500  Weight: 71.2 kg (157 lb) 71.5 kg (157 lb 10.1 oz) 71.3 kg (157 lb 3 oz)     Physical Examination:   Physical Exam: Eyes: No icterus,  extraocular muscles intact  Mouth: Oral mucosa is moist, no lesions on palate,  Neck: Supple, no deformities, masses, or tenderness Lungs: Normal respiratory effort, bilateral rhonchi auscultated Heart: Regular rate and rhythm, S1 and S2 normal, no murmurs, rubs auscultated Abdomen: BS normoactive,soft,nondistended,non-tender to palpation,no organomegaly Extremities: No pretibial edema, no erythema, no cyanosis, no clubbing Neuro : Alert and oriented to time, place and person, No focal  deficits       Data Reviewed: I have personally reviewed following labs and imaging studies  CBG: Recent Labs  Lab 03/05/17 1213  GLUCAP 147*    CBC: Recent Labs  Lab 03/04/17 1849 03/05/17 0422 03/05/17 1233  WBC 5.7 5.6 5.3  NEUTROABS 4.5  --   --   HGB 10.4* 9.8* 10.6*  HCT 32.3* 29.6* 32.7*  MCV 90.5 89.4 90.3  PLT 162 176 193    Basic Metabolic Panel: Recent Labs  Lab 03/04/17 1849 03/05/17 0422  NA 137 138  K 3.6 3.6  CL 104 103  CO2 25 28  GLUCOSE 106* 109*  BUN 14 14  CREATININE 0.75 0.73  CALCIUM 8.2* 8.1*  MG  --  2.0    Recent Results (from the past 240 hour(s))  Respiratory Panel by PCR     Status: Abnormal   Collection Time: 03/04/17  7:11 PM  Result Value Ref Range Status   Adenovirus NOT DETECTED NOT DETECTED Final   Coronavirus 229E NOT DETECTED NOT DETECTED Final   Coronavirus HKU1 NOT DETECTED NOT DETECTED Final   Coronavirus NL63 NOT DETECTED NOT DETECTED Final   Coronavirus OC43 NOT DETECTED NOT DETECTED Final   Metapneumovirus NOT DETECTED NOT DETECTED Final   Rhinovirus / Enterovirus DETECTED (A) NOT DETECTED Final   Influenza A NOT DETECTED NOT DETECTED Final   Influenza B NOT DETECTED NOT DETECTED Final   Parainfluenza Virus 1 NOT DETECTED NOT DETECTED Final   Parainfluenza Virus 2 NOT DETECTED NOT DETECTED Final   Parainfluenza Virus 3 NOT DETECTED NOT DETECTED Final   Parainfluenza Virus 4 NOT DETECTED NOT DETECTED Final   Respiratory Syncytial Virus NOT DETECTED NOT DETECTED Final   Bordetella pertussis NOT DETECTED NOT DETECTED Final   Chlamydophila pneumoniae NOT DETECTED NOT DETECTED Final   Mycoplasma pneumoniae NOT DETECTED NOT DETECTED Final    Comment: Performed at [facility], [address] [place], [place] [address]     Liver Function Tests: Recent Labs  Lab 03/04/17 1849  AST 20  ALT 19  ALKPHOS 60  BILITOT 1.4*  PROT 6.2*  ALBUMIN 3.2*   No results for input(s): LIPASE, AMYLASE in the last  168 hours. No results for input(s): AMMONIA in the last 168 hours.  Cardiac Enzymes: Recent Labs  Lab 03/04/17 1849 03/05/17 0716  TROPONINI 0.05* 0.06*   BNP (last 3 results) Recent Labs    02/14/17 0408 02/15/17 0557 03/04/17 1849  BNP 190.5* 184.2* 358.9*    ProBNP (last 3 results) Recent Labs    01/23/17 1156  PROBNP 2,015*      Studies: Dg Chest 2 View  Result Date: 03/04/2017 CLINICAL DATA:  Dyspnea and cough starting yesterday EXAM: CHEST  2 VIEW COMPARISON:  02/21/2016 FINDINGS: Status post CABG with borderline cardiomegaly. Aortic atherosclerosis is noted. New bilateral left greater than right pleural effusions with pulmonary vascular redistribution and bilateral airspace opacities are consistent with mild to moderate CHF. Superimposed pneumonia would be difficult to entirely exclude but given the bilaterality and diffuse distribution of the airspace opacities,  findings are more in keeping with CHF. No acute osseous abnormality. Degenerative changes are noted along the dorsal spine. IMPRESSION: CHF with bilateral left greater than right pleural effusions. Status post CABG with aortic atherosclerosis. Electronically Signed   By: Ashley Royalty M.D.   On: 03/04/2017 19:14   Ct Head Wo Contrast  Result Date: 03/05/2017 CLINICAL DATA:  Fall 2 days ago. EXAM: CT HEAD WITHOUT CONTRAST TECHNIQUE: Contiguous axial images were obtained from the base of the skull through the vertex without intravenous contrast. COMPARISON:  02/06/2017 FINDINGS: Brain: There is atrophy and chronic small vessel disease changes. No acute intracranial abnormality. Specifically, no hemorrhage, hydrocephalus, mass lesion, acute infarction, or significant intracranial injury. Vascular: No hyperdense vessel or unexpected calcification. Skull: No acute calvarial abnormality. Sinuses/Orbits: Small air-fluid level in the right maxillary sinus. Remainder the paranasal sinuses are clear. Mastoid air cells are  clear. Orbital soft tissues unremarkable. Other: None IMPRESSION: No acute intracranial abnormality. Atrophy, chronic microvascular disease. Small air-fluid level in the right maxillary sinus may reflect acute sinusitis. Electronically Signed   By: Rolm Baptise M.D.   On: 03/05/2017 08:21    Scheduled Meds: . allopurinol  300 mg Oral Daily  . amitriptyline  20 mg Oral QHS  . benzonatate  100 mg Oral TID  . bisacodyl  10 mg Oral Daily  . citalopram  20 mg Oral BID  . clopidogrel  75 mg Oral Daily  . [START ON 03/06/2017] digoxin  0.125 mg Oral Q M,W,F  . donepezil  10 mg Oral Daily  . doxycycline  100 mg Oral BID  . famotidine  20 mg Oral Daily  . furosemide  20 mg Intravenous Q12H  . guaiFENesin  1,200 mg Oral BID  . insulin aspart  0-9 Units Subcutaneous TID WC  . ipratropium  2 spray Nasal TID  . ipratropium-albuterol  3 mL Nebulization Q6H  . levothyroxine  25 mcg Oral QAC breakfast  . memantine  28 mg Oral Daily  . methylPREDNISolone (SOLU-MEDROL) injection  60 mg Intravenous Q8H  . metoprolol succinate  25 mg Oral Daily  . mometasone-formoterol  2 puff Inhalation BID  . pantoprazole (PROTONIX) IV  40 mg Intravenous Q12H  . potassium chloride SA  10 mEq Oral Daily  . warfarin  3 mg Oral ONCE-1800  . Warfarin - Pharmacist Dosing Inpatient   Does not apply q1800      Time spent: 30 min  [facility] Pager [phone]. If 7PM-7AM, please contact night-coverage at [email or web address], Office  [phone]  password [email or web address]  03/05/2017, 1:39 PM  LOS: 1 day

## 2017-03-05 NOTE — Telephone Encounter (Signed)
New message    Daughter - [name] calling to follow up with Dr Tamala Julian regarding her mother being inpatient at [facility]. She has questions and concerns regarding medications  after discharge. Please call [phone]

## 2017-03-05 NOTE — Telephone Encounter (Signed)
Attempted to call daughter back.  Phone rang several times with no answer and no VM option.

## 2017-03-05 NOTE — Consult Note (Signed)
Cardiology Consultation:   Patient ID: Amber [name]; [id number]; 1934-03-15   Admit date: 03/04/2017 Date of Consult: 03/05/2017  Primary Care Provider: Antony Contras, MD Primary Cardiologist: Dr. Tamala Julian Primary Electrophysiologist:  N/A   Patient Profile:   Amber [name] is a 83 y.o. female with a hx of CAD s/p CABG in 2000, chronic atrial fibrillation, chronic diastolic CHF, COPD, CKD stage III, HLD (statin intolerant), and HTN with hypertensive heart disease who is being seen today for the evaluation of acute on CHF at the request of Dr. Darrick Meigs.  History of Present Illness:   Amber [name] presented to the ED yesterday afternoon for evaluation of weight gain, shortness of breath, cough and sputum production. She was admitted from 02/06/2017 to 02/17/2017 for acute on chronic respiratory failure with hypoxia. She was treated for CHF and pneumonia at that time.  She saw Dr Tamala Julian 01/15 and was weak but breathing ok.  She weighs almost every morning and has a scale that is provided by her insurance company and communicates electronically.  They report a 7 pound weight gain in the last 2 weeks.  01/21, she was noted to be weaker and more short of breath.  She describes orthopnea but denies PND.  No lower extremity edema.  2 days ago, her pulse ox was 91%, lower than usual.  She has been wheezing more since the last admission, it waxes and wanes.  She has been coughing up brownish sputum that is blood-tinged at times.  She went to the urgent care 01/22 at which time she was diagnosed with an upper respiratory infection and was prescribed doxycycline. However, her symptoms continued to worsen so she was brought to the ED on 01/23.  Since being admitted, she has gotten steroids, IV Lasix 20 mg x1 in addition to 40 mg daily p.o., nebulizers and is breathing better.  Respiratory status is improved but not back to baseline.   Past Medical History:  Diagnosis Date  . Abnormality of gait 08/02/2014  .  Cerebrovascular disease 05/21/2016  . Cervical spinal stenosis   . Chronic atrial fibrillation (HCC)    a. chronic/rate controlled;  b. chronic coumadin.  . Chronic diastolic CHF (congestive heart failure) ([place])    a. 02/2013 Echo: EF 55-60%, mild AI, mod dil LA.  . CKD (chronic kidney disease), stage III ([place])   . COPD with emphysema ([place])    PFT 05/02/10>>FEV1 1.35(62%), FEV1% 66, DLCO 75%  . Coronary artery disease    a. s/p CABG;  b. Abnormal nuc 2015 - managed medically.  . CVA (cerebral vascular accident) ([place])    left sided weakness  . Dementia   . Depression   . Fibromyalgia   . GERD (gastroesophageal reflux disease)    pepcid 2-3 times per week  . Gout   . Hemiparesis and alteration of sensations as late effects of stroke ([place]) 01/17/2015  . History of melanoma    squamous cell, melanoma  . Hyperlipidemia    a. statin intolerant, not felt to be candidate for PCSK9 due to chronically elevated CK levels.  . Hypertension   . Hypertensive heart disease   . Hypothyroidism   . Insomnia   . Intermittent confusion   . Memory change 01/23/2014  . Nasal polyposis   . Osteoarthritis   . Osteoporosis   . Pneumonia    1990  . Tobacco abuse     Past Surgical History:  Procedure Laterality Date  . ABDOMINAL HYSTERECTOMY    . BREAST  LUMPECTOMY  1980s   Benign lesion - right  . carpel tunnel     right  . CORONARY ARTERY BYPASS GRAFT  2000  . EYE SURGERY     bilateral cataracts  . IR GENERIC HISTORICAL  03/31/2016   IR RADIOLOGIST EVAL & MGMT 03/31/2016 [facility]INTERV RAD  . RADIOLOGY WITH ANESTHESIA  12/24/2011   Procedure: RADIOLOGY WITH ANESTHESIA;  Surgeon: Medication Radiologist, MD;  Location: [facility];  Service: Radiology;  Laterality: N/A;  Extra Cranial Vascular Stent  . RADIOLOGY WITH ANESTHESIA N/A 07/19/2014   Procedure: ANGIOPLASTY;  Surgeon: Luanne Bras, MD;  Location: [facility];  Service: Radiology;  Laterality: N/A;  . RADIOLOGY WITH ANESTHESIA N/A 07/27/2014    Procedure: ANGIOPLASTY;  Surgeon: Luanne Bras, MD;  Location: [facility];  Service: Radiology;  Laterality: N/A;  . TONSILLECTOMY       Prior to Admission medications   Medication Sig Start Date End Date Taking? Authorizing Provider  allopurinol (ZYLOPRIM) 300 MG tablet Take 1 tablet (300 mg total) by mouth every other day. Patient taking differently: Take 300 mg by mouth daily.  03/06/16  Yes Burtis Junes, NP  amitriptyline (ELAVIL) 10 MG tablet TAKE 2 TABLETS BY MOUTH AT BEDTIME Patient taking differently: TAKE 20mg   BY MOUTH AT BEDTIME 09/01/16  Yes Kathrynn Ducking, MD  amLODipine (NORVASC) 5 MG tablet Take 1 tablet (5 mg total) by mouth at bedtime. 01/12/17  Yes Belva Crome, MD  benzonatate (TESSALON) 100 MG capsule Take 1 capsule (100 mg total) by mouth 3 (three) times daily. 02/17/17  Yes Georgette Shell, MD  bisacodyl (DULCOLAX) 5 MG EC tablet Take 2 tablets (10 mg total) by mouth daily. 02/18/17  Yes Georgette Shell, MD  celecoxib (CELEBREX) 200 MG capsule Take 200 mg by mouth every morning.    Yes [provider]  citalopram (CELEXA) 20 MG tablet TAKE 1 TABLET BY MOUTH TWICE A DAY 11/09/14  Yes Darlin Coco, MD  clopidogrel (PLAVIX) 75 MG tablet Take 75 mg by mouth daily. 02/04/17  Yes [provider]  COUMADIN 5 MG tablet TAKE AS DIRECTED BY COUMADIN CLINIC. Hold coumadin November 24 th and November 25 th.  Recheck INR on Monday. Patient taking differently: Take 5-7.5 mg by mouth daily. Take 5 mg everyday except 7.5 mg on Tuesday and Saturday 01/03/17  Yes Hosie Poisson, MD  digoxin (LANOXIN) 0.125 MG tablet Take 1 tablet (0.125 mg total) by mouth on Mondays, Wednesdays, & Fridays 03/27/16  Yes Belva Crome, MD  donepezil (ARICEPT) 10 MG tablet Take 1 tablet (10 mg total) by mouth at bedtime. Patient taking differently: Take 10 mg by mouth daily.  06/13/16  Yes Kathrynn Ducking, MD  doxycycline (VIBRAMYCIN) 100 MG capsule Take 100 mg by mouth 2 (two)  times daily. 03/03/17  Yes [provider]  ergocalciferol (VITAMIN D2) 50000 UNITS capsule Take 50,000 Units by mouth 2 (two) times a week. Tuesday and Friday   Yes [provider]  famotidine (PEPCID) 20 MG tablet Take 1 tablet (20 mg total) by mouth daily. 02/18/17  Yes Georgette Shell, MD  furosemide (LASIX) 40 MG tablet Take 1 tablet (40 mg total) by mouth daily. 02/24/17  Yes Belva Crome, MD  guaiFENesin (MUCINEX) 600 MG 12 hr tablet Take 2 tablets (1,200 mg total) by mouth 2 (two) times daily. 02/17/17  Yes Georgette Shell, MD  ipratropium (ATROVENT) 0.03 % nasal spray Place 2 sprays into the nose 3 (three)  times daily. 02/17/17  Yes Georgette Shell, MD  ipratropium-albuterol (DUONEB) 0.5-2.5 (3) MG/3ML SOLN Take 3 mLs by nebulization 3 (three) times daily. 02/24/17  Yes McQuaid, Ronie Spies, MD  KLOR-CON M20 20 MEQ tablet Take 0.5 tablets (10 mEq total) by mouth daily. 02/17/17  Yes Georgette Shell, MD  levothyroxine (SYNTHROID, LEVOTHROID) 25 MCG tablet Take 25 mcg by mouth every morning.    Yes [provider]  losartan (COZAAR) 50 MG tablet Take 1 tablet (50 mg total) by mouth daily. 01/27/17 01/22/18 Yes Belva Crome, MD  memantine (NAMENDA XR) 28 MG CP24 24 hr capsule TAKE ONE CAPSULE BY MOUTH EVERY DAY 07/15/16  Yes Kathrynn Ducking, MD  metoprolol succinate (TOPROL-XL) 25 MG 24 hr tablet TAKE 1 TABLET (25 MG TOTAL) BY MOUTH DAILY. Patient taking differently: TAKE 1 TABLET (25 MG TOTAL) BY MOUTH DAILY. Morning 05/16/16  Yes Belva Crome, MD  mometasone-formoterol [facility]) 200-5 MCG/ACT AERO Inhale 2 puffs into the lungs 2 (two) times daily.   Yes [provider]  nitroGLYCERIN (NITROSTAT) 0.4 MG SL tablet Place 1 tablet (0.4 mg total) under the tongue every 5 (five) minutes as needed for chest pain. 09/13/12  Yes Darlin Coco, MD  PROAIR HFA 108 (90 BASE) MCG/ACT inhaler INHALE 2 PUFFS INTO LUNGS EVERY 6 HOURS AS NEEDED FOR WHEEZING  12/22/13  Yes Darlin Coco, MD  fluticasone (FLONASE) 50 MCG/ACT nasal spray Place 2 sprays into both nostrils 2 (two) times daily. Patient not taking: Reported on 03/04/2017 02/17/17   Georgette Shell, MD  polyethylene glycol [facility]) packet Take 17 g by mouth daily. Patient not taking: Reported on 03/04/2017 02/18/17   Georgette Shell, MD    Inpatient Medications: Scheduled Meds: . allopurinol  300 mg Oral Daily  . amitriptyline  20 mg Oral QHS  . benzonatate  100 mg Oral TID  . bisacodyl  10 mg Oral Daily  . citalopram  20 mg Oral BID  . clopidogrel  75 mg Oral Daily  . [START ON 03/06/2017] digoxin  0.125 mg Oral Q M,W,F  . donepezil  10 mg Oral Daily  . doxycycline  100 mg Oral BID  . famotidine  20 mg Oral Daily  . furosemide  20 mg Intravenous Q12H  . guaiFENesin  1,200 mg Oral BID  . insulin aspart  0-9 Units Subcutaneous TID WC  . ipratropium  2 spray Nasal TID  . ipratropium-albuterol  3 mL Nebulization Q6H  . levothyroxine  25 mcg Oral QAC breakfast  . memantine  28 mg Oral Daily  . methylPREDNISolone (SOLU-MEDROL) injection  60 mg Intravenous Q8H  . metoprolol succinate  25 mg Oral Daily  . mometasone-formoterol  2 puff Inhalation BID  . pantoprazole (PROTONIX) IV  40 mg Intravenous Q12H  . potassium chloride SA  10 mEq Oral Daily  . Warfarin - Pharmacist Dosing Inpatient   Does not apply q1800   Continuous Infusions:  PRN Meds: acetaminophen **OR** acetaminophen, albuterol, nitroGLYCERIN, ondansetron **OR** ondansetron (ZOFRAN) IV  Allergies:    Allergies  Allergen Reactions  . Ezetimibe Other (See Comments)    Myalgia  . Welchol [Colesevelam Hcl] Other (See Comments)    Muscle aches  . Adhesive [Tape] Itching, Rash and Other (See Comments)    Burning   . Ceclor [Cefaclor] Other (See Comments)    Unknown allergic reaction  . Elastic Bandages & [Zinc] Rash and Other (See Comments)    Turns red on the areas  it touches  . Latex Itching,  Rash and Other (See Comments)    Burning   . Lipitor [Atorvastatin Calcium] Other (See Comments)    Increased fibromyalgia pain  . Mevacor [Lovastatin] Other (See Comments)    Increased fibromyalgia pain  . Pravachol Other (See Comments)    Increased fibromyalgia pain  . Vasotec Other (See Comments)    Unknown allergic reaction    Social History:   Social History   Socioeconomic History  . Marital status: Married    Spouse name: Not on file  . Number of children: 2  . Years of education: Not on file  . Highest education level: Not on file  Social Needs  . Financial resource strain: Not on file  . Food insecurity - worry: Not on file  . Food insecurity - inability: Not on file  . Transportation needs - medical: Not on file  . Transportation needs - non-medical: Not on file  Occupational History  . 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 Occupation: Network engineer    Comment: Retired  Tobacco Use  . Smoking status: Former Smoker    Packs/day: 1.50    Years: 25.00    Pack years: 37.50    Types: Cigarettes    Last attempt to quit: 02/10/1977    Years since quitting: 40.0  . Smokeless tobacco: Never Used  Substance and Sexual Activity  . Alcohol use: No  . Drug use: No  . Sexual activity: Not on file  Other Topics Concern  . Not on file  Social History Narrative   Patient is right handed.   Patient lives at home with husband   Patient drinks 8 oz caffeine occasionally.    Family History:   Family History  Problem Relation Age of Onset  . Stroke Mother   . CAD Father   . Diabetes type II Brother   . CAD Brother    Family Status:  Family Status  Relation Name Status  . Mother  Deceased       stroke  . Father  Deceased       cad  . Brother  Deceased  . MGM  Deceased  . MGF  Deceased  . PGM  Deceased  . PGF  Deceased    ROS:  Please see the history of present illness.  All other ROS reviewed and negative.     Physical  Exam/Data:   Vitals:   03/05/17 0500 03/05/17 0852 03/05/17 1000 03/05/17 1139  BP: 98/73  136/61 (!) 166/67  Pulse: 90  80 74  Resp: 18  20   Temp: 98.6 F (37 C)  98.3 F (36.8 C)   TempSrc: Oral  Oral   SpO2: 95% 96% 98%   Weight: 157 lb 3 oz (71.3 kg)     Height:        Intake/Output Summary (Last 24 hours) at 03/05/2017 1208 Last data filed at 03/05/2017 1147 Gross per 24 hour  Intake -  Output 600 ml  Net -600 ml   Filed Weights   03/04/17 1810 03/05/17 0039 03/05/17 0500  Weight: 157 lb (71.2 kg) 157 lb 10.1 oz (71.5 kg) 157 lb 3 oz (71.3 kg)   Body mass index is 26.98 kg/m.  General:  Well nourished, well developed, in no acute distress HEENT: normal Lymph: no adenopathy Neck: JVD 9 cm Endocrine:  No thryomegaly Vascular: No carotid bruits; 4/4 extremity pulses 2+, without bruits  Cardiac:  normal S1,  S2; Irreg R&R; 2/6 murmur  Lungs:  Decreased BS bases bilaterally, + exp wheezing, + rhonchi, + rales  Abd: soft, nontender, no hepatomegaly  Ext: no edema Musculoskeletal:  No deformities, BUE and BLE strength weak but equal Skin: warm and dry  Neuro:  CNs 2-12 intact, no focal abnormalities noted Psych:  Normal affect   EKG:  The EKG was personally reviewed and demonstrates: 02/11/2017 atrial fib, no acute ischemic changes, rate is controlled at 79 Telemetry:  Telemetry was personally reviewed and demonstrates: Atrial fib, heart rate generally controlled.  Relevant CV Studies:  ECHO 02/09/2017: Study Conclusions: - Left ventricle: The cavity size was normal. Systolic function was   normal. The estimated ejection fraction was in the range of 60%   to 65%. Wall motion was normal; there were no regional wall   motion abnormalities. - Aortic valve: There was mild stenosis. Mean gradient (S): 9 mm   Hg. Valve area (VTI): 1.41 cm^2. Valve area (Vmax): 1.38 cm^2.   Valve area (Vmean): 1.25 cm^2. - Aortic root: The aortic root was normal in size. - Mitral valve:  The findings are consistent with mild stenosis.   Mean gradient (D): 3 mm Hg. Valve area by pressure half-time:   1.68 cm^2. Valve area by continuity equation (using LVOT flow):   1.79 cm^2. - Left atrium: The atrium was moderately dilated. - Right ventricle: The cavity size was normal. Wall thickness was   normal. Systolic function was normal. - Right atrium: The atrium was normal in size. - Tricuspid valve: There was moderate regurgitation. - Pulmonary arteries: Systolic pressure was moderately increased.   PA peak pressure: 52 mm Hg (S). - Inferior vena cava: The vessel was dilated. The respirophasic   diameter changes were blunted (< 50%), consistent with elevated   central venous pressure. - Pericardium, extracardiac: There was no pericardial effusion.   Laboratory Data:  Chemistry Recent Labs  Lab 03/04/17 1849 03/05/17 0422  NA 137 138  K 3.6 3.6  CL 104 103  CO2 25 28  GLUCOSE 106* 109*  BUN 14 14  CREATININE 0.75 0.73  CALCIUM 8.2* 8.1*  GFRNONAA >60 >60  GFRAA >60 >60  ANIONGAP 8 7    Lab Results  Component Value Date   ALT 19 03/04/2017   AST 20 03/04/2017   ALKPHOS 60 03/04/2017   BILITOT 1.4 (H) 03/04/2017   Hematology Recent Labs  Lab 03/04/17 1849 03/05/17 0422 03/05/17 0716  WBC 5.7 5.6  --   RBC 3.57* 3.31* 3.19*  HGB 10.4* 9.8*  --   HCT 32.3* 29.6*  --   MCV 90.5 89.4  --   MCH 29.1 29.6  --   MCHC 32.2 33.1  --   RDW 16.4* 16.4*  --   PLT 162 176  --    Cardiac Enzymes Recent Labs  Lab 03/04/17 1849 03/05/17 0716  TROPONINI 0.05* 0.06*     BNP Recent Labs  Lab 03/04/17 1849  BNP 358.9*    TSH:  Lab Results  Component Value Date   TSH 2.045 03/05/2017   Lipids: Lab Results  Component Value Date   CHOL 392 (H) 12/11/2014   HDL 44 (L) 12/11/2014   LDLCALC 273 (H) 12/11/2014   LDLDIRECT 236.0 08/02/2014   TRIG 373 (H) 12/11/2014   CHOLHDL 8.9 (H) 12/11/2014   HgbA1c: Lab Results  Component Value Date   HGBA1C  6.3 (H) 02/14/2013   Magnesium:  Magnesium  Date Value Ref Range Status  03/05/2017 2.0 1.7 - 2.4 mg/dL Final   Lab Results  Component Value Date   IRON 11 (L) 03/05/2017   TIBC 178 (L) 03/05/2017   FERRITIN 373 (H) 03/05/2017   Radiology/Studies:  Dg Chest 2 View  Result Date: 03/04/2017 CLINICAL DATA:  Dyspnea and cough starting yesterday EXAM: CHEST  2 VIEW COMPARISON:  02/21/2016 FINDINGS: Status post CABG with borderline cardiomegaly. Aortic atherosclerosis is noted. New bilateral left greater than right pleural effusions with pulmonary vascular redistribution and bilateral airspace opacities are consistent with mild to moderate CHF. Superimposed pneumonia would be difficult to entirely exclude but given the bilaterality and diffuse distribution of the airspace opacities, findings are more in keeping with CHF. No acute osseous abnormality. Degenerative changes are noted along the dorsal spine. IMPRESSION: CHF with bilateral left greater than right pleural effusions. Status post CABG with aortic atherosclerosis. Electronically Signed   By: Ashley Royalty M.D.   On: 03/04/2017 19:14   Ct Head Wo Contrast  Result Date: 03/05/2017 CLINICAL DATA:  Fall 2 days ago. EXAM: CT HEAD WITHOUT CONTRAST TECHNIQUE: Contiguous axial images were obtained from the base of the skull through the vertex without intravenous contrast. COMPARISON:  02/06/2017 FINDINGS: Brain: There is atrophy and chronic small vessel disease changes. No acute intracranial abnormality. Specifically, no hemorrhage, hydrocephalus, mass lesion, acute infarction, or significant intracranial injury. Vascular: No hyperdense vessel or unexpected calcification. Skull: No acute calvarial abnormality. Sinuses/Orbits: Small air-fluid level in the right maxillary sinus. Remainder the paranasal sinuses are clear. Mastoid air cells are clear. Orbital soft tissues unremarkable. Other: None IMPRESSION: No acute intracranial abnormality. Atrophy,  chronic microvascular disease. Small air-fluid level in the right maxillary sinus may reflect acute sinusitis. Electronically Signed   By: Rolm Baptise M.D.   On: 03/05/2017 08:21    Assessment and Plan:    Principal Problem:   Acute on chronic diastolic (congestive) heart failure (HCC) Active Problems:   Atrial fibrillation (HCC)   Fibromyalgia   Hypothyroidism   Hemiparesis and alteration of sensations as late effects of stroke (HCC)   CHF (congestive heart failure) ([place])  Principal Problem 1. Acute on chronic diastolic CHF - Would continue Lasix at 40 mg IV bid for 24-48 hours - follow renal function closely - follow daily weights, standing if possible. - once diuresed, recheck CXR to make sure effusions have improved.  2. Chronic atrial fibrillation - rate control generally good - HR may be a little higher 2nd steroids - continue Toprol XL 25 mg and Dig 0.125 mg at home doses  - ck dig level  3. HTN - has had 2 SBP readings that were 90s, but they may have been inaccurate - amlodipine is on hold to make sure BP tolerates diuresis - restart per IM at 2.5 mg qd if BP allows.   4. Anemia - Iron is 11 - per IM  5. CKD Stage III - follow w/ diuresis - GFR currently > 60  6. Hyperlipidemia - last labs we have are 2016 but total cholesterol 392 and LDL 236 at that time - will recheck in am  For questions or updates, please contact [facility] Please consult [email or web address] for contact info under Cardiology/STEMI.   Signed, Darreld Mclean, Student-PA  03/05/2017 12:08 PM

## 2017-03-06 ENCOUNTER — Ambulatory Visit: Payer: Medicare Other | Admitting: Pulmonary Disease

## 2017-03-06 ENCOUNTER — Ambulatory Visit: Payer: Medicare Other | Admitting: Adult Health

## 2017-03-06 ENCOUNTER — Inpatient Hospital Stay (HOSPITAL_COMMUNITY): Payer: Medicare Other

## 2017-03-06 DIAGNOSIS — J810 Acute pulmonary edema: Secondary | ICD-10-CM

## 2017-03-06 LAB — BASIC METABOLIC PANEL
Anion gap: 10 (ref 5–15)
BUN: 20 mg/dL (ref 6–20)
CALCIUM: 7.9 mg/dL — AB (ref 8.9–10.3)
CHLORIDE: 103 mmol/L (ref 101–111)
CO2: 26 mmol/L (ref 22–32)
CREATININE: 0.99 mg/dL (ref 0.44–1.00)
GFR calc non Af Amer: 52 mL/min — ABNORMAL LOW (ref >60)
Glucose, Bld: 256 mg/dL — ABNORMAL HIGH (ref 65–99)
Potassium: 4 mmol/L (ref 3.5–5.1)
SODIUM: 139 mmol/L (ref 135–145)

## 2017-03-06 LAB — CBC
HEMATOCRIT: 29.8 % — AB (ref 36.0–46.0)
HEMOGLOBIN: 9.6 g/dL — AB (ref 12.0–15.0)
MCH: 28.7 pg (ref 26.0–34.0)
MCHC: 32.2 g/dL (ref 30.0–36.0)
MCV: 89.2 fL (ref 78.0–100.0)
Platelets: 186 10*3/uL (ref 150–400)
RBC: 3.34 MIL/uL — ABNORMAL LOW (ref 3.87–5.11)
RDW: 15.9 % — AB (ref 11.5–15.5)
WBC: 3.7 10*3/uL — ABNORMAL LOW (ref 4.0–10.5)

## 2017-03-06 LAB — LIPID PANEL
CHOL/HDL RATIO: 3.7 ratio
CHOLESTEROL: 228 mg/dL — AB (ref 0–200)
HDL: 62 mg/dL (ref >40)
LDL Cholesterol: 159 mg/dL — ABNORMAL HIGH (ref 0–99)
Triglycerides: 34 mg/dL (ref <150)
VLDL: 7 mg/dL (ref 0–40)

## 2017-03-06 LAB — PROTIME-INR
INR: 2.72
PROTHROMBIN TIME: 28.6 s — AB (ref 11.4–15.2)

## 2017-03-06 LAB — GLUCOSE, CAPILLARY
GLUCOSE-CAPILLARY: 201 mg/dL — AB (ref 65–99)
GLUCOSE-CAPILLARY: 258 mg/dL — AB (ref 65–99)
GLUCOSE-CAPILLARY: 139 mg/dL — AB (ref 65–99)
Glucose-Capillary: 202 mg/dL — ABNORMAL HIGH (ref 65–99)

## 2017-03-06 LAB — DIGOXIN LEVEL: Digoxin Level: <0.2 ng/mL — ABNORMAL LOW (ref 0.8–2.0)

## 2017-03-06 MED ORDER — WARFARIN SODIUM 5 MG PO TABS
5.0000 mg | ORAL_TABLET | Freq: Once | ORAL | Status: AC
  Administered 2017-03-06: 5 mg via ORAL
  Filled 2017-03-06: qty 1

## 2017-03-06 MED ORDER — FUROSEMIDE 40 MG PO TABS
40.0000 mg | ORAL_TABLET | Freq: Every day | ORAL | Status: DC
  Administered 2017-03-06 – 2017-03-07 (×2): 40 mg via ORAL
  Filled 2017-03-06 (×2): qty 1

## 2017-03-06 MED ORDER — METHYLPREDNISOLONE SODIUM SUCC 40 MG IJ SOLR
40.0000 mg | Freq: Two times a day (BID) | INTRAMUSCULAR | Status: DC
  Administered 2017-03-06 – 2017-03-07 (×2): 40 mg via INTRAVENOUS
  Filled 2017-03-06 (×2): qty 1

## 2017-03-06 NOTE — Progress Notes (Signed)
ANTICOAGULATION CONSULT NOTE - [facility] for Warfarin Indication: atrial fibrillation, hx of CVA  Allergies  Allergen Reactions  . Ezetimibe Other (See Comments)    Myalgia  . Welchol [Colesevelam Hcl] Other (See Comments)    Muscle aches  . Adhesive [Tape] Itching, Rash and Other (See Comments)    Burning   . Ceclor [Cefaclor] Other (See Comments)    Unknown allergic reaction  . Elastic Bandages & [Zinc] Rash and Other (See Comments)    Turns red on the areas it touches  . Latex Itching, Rash and Other (See Comments)    Burning   . Lipitor [Atorvastatin Calcium] Other (See Comments)    Increased fibromyalgia pain  . Mevacor [Lovastatin] Other (See Comments)    Increased fibromyalgia pain  . Pravachol Other (See Comments)    Increased fibromyalgia pain  . Vasotec Other (See Comments)    Unknown allergic reaction    Patient Measurements: Height: 5\' 4"  (162.6 cm) Weight: 154 lb 12.2 oz (70.2 kg) IBW/kg (Calculated) : 54.7   Vital Signs: Temp: 97.7 F (36.5 C) (01/25 0529) Temp Source: Oral (01/25 0529) BP: 130/71 (01/25 0529) Pulse Rate: 86 (01/25 0529)  Labs: Recent Labs    03/04/17 1849 03/05/17 0422 03/05/17 0716 03/05/17 1233 03/05/17 1821 03/06/17 0410  HGB 10.4* 9.8*  --  10.6*  --  9.6*  HCT 32.3* 29.6*  --  32.7*  --  29.8*  PLT 162 176  --  191  --  186  LABPROT 26.4* 27.3*  --   --   --  28.6*  INR 2.45 2.56  --   --   --  2.72  CREATININE 0.75 0.73  --   --   --  0.99  TROPONINI 0.05*  --  0.06* 0.05* 0.04*  --     Estimated Creatinine Clearance: 43.6 mL/min (by C-G formula based on SCr of 0.99 mg/dL).   Assessment: 82 y/oF on chronic warfarin at home for atrial fibrillation and history of CVA admitted with c/o shortness of breath, fatigue, wheezing, and productive cough. Pharmacy consulted to dose warfarin while patient in the hospital. Home dose of warfarin reported as 5 mg daily except 7.5 mg on Tuesdays and  Saturdays with last dose 1/22 at 0830. INR threapeutic on admission.   Today, 03/06/17:   INR = 2.72 is therapeutic  CBC: Hgb 9.6, Pltc WNL  RN reports low PO intake  Drug interactions: Doxycycline, steroids, plavix  CT of head: No acute intracranial abnormality  FOBT negative this AM  Patient's daughter reports patient having some blood tinged sputum prior to coming into hospital, none since admission  No bleeding issues reported currently  Goal of Therapy:  INR 2-3   Plan:   Warfarin 5 mg PO x 1 tonight   Daily PT/INR  Monitor closely for s/sx of bleeding  Eudelia Bunch, Pharm.D. [phone] 03/06/2017 8:37 AM

## 2017-03-06 NOTE — Care Management Note (Signed)
Case Management Note  Patient Details  Name: Amber [name] MRN: [id number] Date of Birth: September 19, 1934  Subjective/Objective: Pt admitted Acute on chronic diastolic CHF                Action/Plan: Plan to discharge home with [place] and Daughters   Expected Discharge Date:                  Expected Discharge Plan:  [place]  In-House Referral:  Clinical Social Work  Discharge planning Services  CM Consult  Post Acute Care Choice:    Choice offered to:  Patient  DME Arranged:    DME Agency:     HH Arranged:  RN, PT, OT, Nurse's Aide [place] Agency:  [facility]  Status of Service:  In process, will continue to follow  If discussed at Long Length of Stay Meetings, dates discussed:    Additional CommentsPurcell Mouton, RN 03/06/2017, 11:16 AM

## 2017-03-06 NOTE — Progress Notes (Signed)
Progress Note  Patient Name: Amber [name] Date of Encounter: 03/06/2017  Primary Cardiologist: Dr Tamala Julian   Subjective   SOB improved, wheezing less, cough still productive of dk yellow sputum, trace blood  Inpatient Medications    Scheduled Meds: . allopurinol  300 mg Oral Daily  . amitriptyline  20 mg Oral QHS  . benzonatate  100 mg Oral TID  . bisacodyl  10 mg Oral Daily  . citalopram  20 mg Oral BID  . clopidogrel  75 mg Oral Daily  . digoxin  0.125 mg Oral Q M,W,F  . donepezil  10 mg Oral Daily  . doxycycline  100 mg Oral BID  . famotidine  20 mg Oral Daily  . furosemide  40 mg Intravenous BID  . guaiFENesin  1,200 mg Oral BID  . insulin aspart  0-9 Units Subcutaneous TID WC  . ipratropium  2 spray Nasal TID  . ipratropium-albuterol  3 mL Nebulization TID  . levothyroxine  25 mcg Oral QAC breakfast  . memantine  28 mg Oral Daily  . methylPREDNISolone (SOLU-MEDROL) injection  40 mg Intravenous Q8H  . metoprolol succinate  25 mg Oral Daily  . mometasone-formoterol  2 puff Inhalation BID  . potassium chloride SA  10 mEq Oral Daily  . warfarin  5 mg Oral ONCE-1800  . Warfarin - Pharmacist Dosing Inpatient   Does not apply q1800   Continuous Infusions:  PRN Meds: acetaminophen **OR** acetaminophen, albuterol, nitroGLYCERIN, ondansetron **OR** ondansetron (ZOFRAN) IV   Vital Signs    Vitals:   03/05/17 2046 03/05/17 2125 03/06/17 0529 03/06/17 0900  BP:  124/78 130/71   Pulse:  63 86   Resp:  16 18   Temp:  97.9 F (36.6 C) 97.7 F (36.5 C)   TempSrc:  Oral Oral   SpO2: 94% 93% 95%   Weight:   154 lb 12.2 oz (70.2 kg) 153 lb 12.8 oz (69.8 kg)  Height:        Intake/Output Summary (Last 24 hours) at 03/06/2017 0905 Last data filed at 03/06/2017 0535 Gross per 24 hour  Intake -  Output 750 ml  Net -750 ml   Filed Weights   03/05/17 0500 03/06/17 0529 03/06/17 0900  Weight: 157 lb 3 oz (71.3 kg) 154 lb 12.2 oz (70.2 kg) 153 lb 12.8 oz (69.8 kg)     Telemetry    Atrial fib, HR controlled, some bradycardia but HR not sustained < 50 - Personally Reviewed  ECG      02/11/2017 atrial fib, no acute ischemic changes, rate is controlled at 79 - Personally Reviewed  Physical Exam   General: Well developed, well nourished, female appearing in no acute distress. Head: Normocephalic, atraumatic.  Neck: Supple without bruits, JVD 8-9 cm. Lungs:  Resp regular and unlabored, coarse rales and some rhonchi. Heart: Irreg R&R, S1, S2, no S3, S4, 2/6 murmur; no rub. Abdomen: Soft, non-tender, non-distended with normoactive bowel sounds. No hepatomegaly. No rebound/guarding. No obvious abdominal masses. Extremities: No clubbing, cyanosis, no edema. Distal pedal pulses are 2+ bilaterally. Neuro: Alert and oriented X 3. Moves all extremities spontaneously. Psych: Normal affect.  Labs    Hematology Recent Labs  Lab 03/05/17 0422 03/05/17 0716 03/05/17 1233 03/06/17 0410  WBC 5.6  --  5.3 3.7*  RBC 3.31* 3.19* 3.62* 3.34*  HGB 9.8*  --  10.6* 9.6*  HCT 29.6*  --  32.7* 29.8*  MCV 89.4  --  90.3 89.2  MCH 29.6  --  29.3 28.7  MCHC 33.1  --  32.4 32.2  RDW 16.4*  --  16.3* 15.9*  PLT 176  --  191 186    Chemistry Recent Labs  Lab 03/04/17 1849 03/05/17 0422 03/06/17 0410  NA 137 138 139  K 3.6 3.6 4.0  CL 104 103 103  CO2 25 28 26   GLUCOSE 106* 109* 256*  BUN 14 14 20   CREATININE 0.75 0.73 0.99  CALCIUM 8.2* 8.1* 7.9*  PROT 6.2*  --   --   ALBUMIN 3.2*  --   --   AST 20  --   --   ALT 19  --   --   ALKPHOS 60  --   --   BILITOT 1.4*  --   --   GFRNONAA >60 >60 52*  GFRAA >60 >60 >60  ANIONGAP 8 7 10      Cardiac Enzymes Recent Labs  Lab 03/04/17 1849 03/05/17 0716 03/05/17 1233 03/05/17 1821  TROPONINI 0.05* 0.06* 0.05* 0.04*   No results for input(s): TROPIPOC in the last 168 hours.   BNP Recent Labs  Lab 03/04/17 1849  BNP 358.9*    Lab Results  Component Value Date   INR 2.72 03/06/2017   INR 2.56  03/05/2017   INR 2.45 03/04/2017     Radiology    Dg Chest 2 View  Result Date: 03/04/2017 CLINICAL DATA:  Dyspnea and cough starting yesterday EXAM: CHEST  2 VIEW COMPARISON:  02/21/2016 FINDINGS: Status post CABG with borderline cardiomegaly. Aortic atherosclerosis is noted. New bilateral left greater than right pleural effusions with pulmonary vascular redistribution and bilateral airspace opacities are consistent with mild to moderate CHF. Superimposed pneumonia would be difficult to entirely exclude but given the bilaterality and diffuse distribution of the airspace opacities, findings are more in keeping with CHF. No acute osseous abnormality. Degenerative changes are noted along the dorsal spine. IMPRESSION: CHF with bilateral left greater than right pleural effusions. Status post CABG with aortic atherosclerosis. Electronically Signed   By: Ashley Royalty M.D.   On: 03/04/2017 19:14   Ct Head Wo Contrast  Result Date: 03/05/2017 CLINICAL DATA:  Fall 2 days ago. EXAM: CT HEAD WITHOUT CONTRAST TECHNIQUE: Contiguous axial images were obtained from the base of the skull through the vertex without intravenous contrast. COMPARISON:  02/06/2017 FINDINGS: Brain: There is atrophy and chronic small vessel disease changes. No acute intracranial abnormality. Specifically, no hemorrhage, hydrocephalus, mass lesion, acute infarction, or significant intracranial injury. Vascular: No hyperdense vessel or unexpected calcification. Skull: No acute calvarial abnormality. Sinuses/Orbits: Small air-fluid level in the right maxillary sinus. Remainder the paranasal sinuses are clear. Mastoid air cells are clear. Orbital soft tissues unremarkable. Other: None IMPRESSION: No acute intracranial abnormality. Atrophy, chronic microvascular disease. Small air-fluid level in the right maxillary sinus may reflect acute sinusitis. Electronically Signed   By: Rolm Baptise M.D.   On: 03/05/2017 08:21     Cardiac Studies    None this admit  Patient Profile     83 y.o. female w/ hx CAD s/p CABG in 2000, chronic atrial fibrillation, D-CHF, COPD, CKD stage III, HLD (statin intolerant), and HTN with hypertensive heart disease, was admitted 01/23 w/ SOB, wt up 7 lbs, COPD exacerbation  Assessment & Plan    Principal Problem: 1.  Acute on chronic diastolic (congestive) heart failure (HCC) - wt down 4 lbs so far - dry wt at home approx 150, pt had orthotics/brace on so feel she is close to that -  will go ahead and change Lasix to po, home dose of 40 mg qd - continue to track standing wts  2. Elevated troponin - mild elevation c/w CHF, no further eval planned.  3. Chronic afib - HR is controlled on home dose metoprolol  4. Anticoag - coumadin is therapeutic - Pt has some blood in sputum - pt also on Plavix, MD advise on continuing this, could get Dr Thompson Caul input  Otherwise, per IM Active Problems:   Atrial fibrillation ([place])   Fibromyalgia   Hypothyroidism   Hemiparesis and alteration of sensations as late effects of stroke [facility])   COPD with acute exacerbation ([place])   CHF (congestive heart failure) [facility])   Palliative care encounter    Jonetta Speak , PA-C 9:05 AM 03/06/2017 Pager: [phone]

## 2017-03-06 NOTE — Evaluation (Signed)
Physical Therapy Evaluation Patient Details Name: Amber [name] MRN: [id number] DOB: 05-29-1934 Today's Date: 03/06/2017   History of Present Illness  83 y.o. female who was admitted for CHF and PNA with elevated troponin is referred to PT.  Has niece in attendance.  PMHx:  resp distress, hypoxia and bronchitis, COPD,  sepsis, tachycardia, tachypnea, lactic acidosis, memory issues, HTN, CVA with left hemi, fibromyalgia, dementia, CAD, CHF, cervical spine stenosis, A-fib, and CABG, cardiomegaly,   Clinical Impression  Pt was assessed for gait after having a short walk with nursing already this AM.  Her plan is to go home with husband and progress with HHPT for more gait and strength, and will note that L ankle is weak but at PLOF likely.  Follow acutely to transition home when ready and progress with all goals as tolerated.    Follow Up Recommendations Home health PT;Supervision/Assistance - 24 hour    Equipment Recommendations  None recommended by PT    Recommendations for Other Services OT consult     Precautions / Restrictions Precautions Precautions: Fall Precaution Comments: recent h/o falls Required Braces or Orthoses: Other Brace/Splint Other Brace/Splint: left leg bil upright AFO Restrictions Weight Bearing Restrictions: No RLE Weight Bearing: Weight bearing as tolerated Other Position/Activity Restrictions: L ankle is anklyosed in inverted position      Mobility  Bed Mobility Overal bed mobility: Needs Assistance Bed Mobility: Supine to Sit;Sit to Supine     Supine to sit: Min assist Sit to supine: Min assist   General bed mobility comments: used vc's and physical prompts for sequence and pt followed instructions well  Transfers Overall transfer level: Needs assistance Equipment used: Rolling walker (2 wheeled);1 person hand held assist Transfers: Sit to/from Stand Sit to Stand: Min guard;Min assist         General transfer comment: cues to sequence then  help to power up  Ambulation/Gait Ambulation/Gait assistance: Min guard;Min assist Ambulation Distance (Feet): 100 Feet Assistive device: Rolling walker (2 wheeled) Gait Pattern/deviations: Step-through pattern;Decreased stride length;Decreased weight shift to left Gait velocity: reduced Gait velocity interpretation: Below normal speed for age/gender General Gait Details: L ankle in inverted posture but is chronic even with upright brace  Stairs            Wheelchair Mobility    Modified Rankin (Stroke Patients Only) Modified Rankin (Stroke Patients Only) Pre-Morbid Rankin Score: Moderate disability Modified Rankin: Moderately severe disability     Balance Overall balance assessment: Needs assistance;History of Falls Sitting-balance support: Feet supported Sitting balance-Leahy Scale: Fair     Standing balance support: Bilateral upper extremity supported;During functional activity Standing balance-Leahy Scale: Poor                               Pertinent Vitals/Pain Pain Assessment: No/denies pain    Home Living Family/patient expects to be discharged to:: Private residence Living Arrangements: Spouse/significant other Available Help at Discharge: Family Type of Home: House Home Access: Ramped entrance     Home Layout: One level Home Equipment: Environmental consultant - 4 wheels;Electric scooter;Other (comment) Additional Comments: husband there and daughter next door    Prior Function Level of Independence: Needs assistance   Gait / Transfers Assistance Needed: uses rollator at baseline.  ADL's / Homemaking Assistance Needed: husband and daughter assist housework  Comments: has been getting a little assist for bath and walked with no help     Hand Dominance   Dominant Hand:  Right    Extremity/Trunk Assessment   Upper Extremity Assessment Upper Extremity Assessment: Overall WFL for tasks assessed    Lower Extremity Assessment Lower Extremity  Assessment: LLE deficits/detail LLE Deficits / Details: L ankle is weak but stiffness has developed to keep ankle inverted, RLE is 4+ to5  and LLE is 3+ to 4- knee and hip LLE Coordination: decreased gross motor;decreased fine motor    Cervical / Trunk Assessment Cervical / Trunk Assessment: Kyphotic  Communication   Communication: No difficulties  Cognition Arousal/Alertness: Awake/alert Behavior During Therapy: WFL for tasks assessed/performed Overall Cognitive Status: History of cognitive impairments - at baseline                                 General Comments: following instructions but easily distracted      General Comments      Exercises     Assessment/Plan    PT Assessment Patient needs continued PT services  PT Problem List Decreased strength;Decreased range of motion;Decreased activity tolerance;Decreased balance;Decreased mobility;Decreased coordination;Decreased cognition;Decreased knowledge of use of DME;Decreased safety awareness;Cardiopulmonary status limiting activity       PT Treatment Interventions DME instruction;Gait training;Functional mobility training;Therapeutic activities;Therapeutic exercise;Balance training;Neuromuscular re-education;Patient/family education    PT Goals (Current goals can be found in the Care Plan section)  Acute Rehab PT Goals Patient Stated Goal: to walk on the hallway PT Goal Formulation: With patient/family Time For Goal Achievement: 03/20/17 Potential to Achieve Goals: Good    Frequency Min 4X/week   Barriers to discharge Decreased caregiver support husband is there with daughter nearby    Co-evaluation               AM-PAC PT "6 Clicks" Daily Activity  Outcome Measure Difficulty turning over in bed (including adjusting bedclothes, sheets and blankets)?: A Little Difficulty moving from lying on back to sitting on the side of the bed? : Unable Difficulty sitting down on and standing up from a  chair with arms (e.g., wheelchair, bedside commode, etc,.)?: Unable Help needed moving to and from a bed to chair (including a wheelchair)?: A Little Help needed walking in hospital room?: A Little Help needed climbing 3-5 steps with a railing? : A Lot 6 Click Score: 13    End of Session Equipment Utilized During Treatment: Gait belt Activity Tolerance: Patient tolerated treatment well Patient left: in bed;with call bell/phone within reach;with family/visitor present Nurse Communication: Mobility status PT Visit Diagnosis: Unsteadiness on feet (R26.81);Other abnormalities of gait and mobility (R26.89);Muscle weakness (generalized) (M62.81);History of falling (Z91.81);Difficulty in walking, not elsewhere classified (R26.2);Hemiplegia and hemiparesis Hemiplegia - Right/Left: Left Hemiplegia - dominant/non-dominant: Non-dominant Hemiplegia - caused by: Unspecified    Time: 9528-4132 PT Time Calculation (min) (ACUTE ONLY): 25 min   Charges:   PT Evaluation $PT Eval Moderate Complexity: 1 Mod PT Treatments $Gait Training: 8-22 mins   PT G Codes:   PT G-Codes **NOT FOR INPATIENT CLASS** Functional Assessment Tool Used: AM-PAC 6 Clicks Basic Mobility    Ramond Dial 03/06/2017, 1:14 PM   Mee Hives, PT MS Acute Rehab Dept. Number: [place]

## 2017-03-06 NOTE — Progress Notes (Signed)
[facility]  PROGRESS NOTE  Amber [name] MPN:[id number] DOB: Apr 16, 1934 DOA: 03/04/2017 PCP: Antony Contras, MD   Brief HPI:   83 y.o. female with history of diastolic CHF, COPD, chronic atrial fibrillation, CAD, gout was brought to the ER after patient was found to be having increasing fatigue and shortness of breath.  Patient was just recently admitted 3 weeks ago for respiratory failure and at that time was treated for CHF and pneumonia.  Patient following which has had follow-up with cardiology and pulmonologist.  Patient at discharge was on Lasix 20 mg which was increased back to 40 mg by cardiologist shortly after discharge.  Denies any chest pain has been having some wheezing and productive cough for which patient had gone to urgent care center 2 days ago and was prescribed doxycycline.  Since patient symptoms was gradually and progressively worsening patient was brought to the ER.     Subjective   Patient seen and examined, breathing has improved. Not requiring oxygen anymore.   Assessment/Plan:     1. Acute on chronic diastolic CHF exacerbation- patient presents with BNP of 358.9, mild elevation of troponin 0.05, 0.06. She received Lasix 40 mg IV q 12 hr and now Lasix has been changed to 40 mg po daily per  cardiology 2. Acute bronchitis/COPD exacerbation- patient has significant rhonchi, has been coughing up phlegm. She was discharged on prednisone taper during previous admission. Respiratory panel positive for rhinovirus/enterovirus. Patient was started on IV  Solu-Medrol 40 mg IV every 8 hours, which has been changed to 40 mg q 12 hr. Continue Mucinex 1 tablet by mouth twice a day, DuoNeb nebulizers every 6 hours. Continue doxycycline. 3. Atrial fibrillation- heart rate is controlled, continue metoprolol, Coumadin per pharmacy consultation. 4. Normocytic normochromic anemia- FOBT is negative, hemoglobin is stable at 9.6.  5. CAD-mild elevation of troponin, continue Plavix,  Toprol-XL. Patient is intolerant to statins. 6. Hypothyroidism-continue Synthroid 7. Recent fall-CT head was negative for bleed 8. History of gout-continue allopurinol    DVT prophylaxis: Coumadin  Code Status: DO NOT RESUSCITATE  Family Communication: Discussed with patient's daughter at bedside and another daughter on phone   Disposition Plan: Likely home in next 3-4 days   Consultants:  None  Procedures:  None  Continuous infusions     Antibiotics:   Anti-infectives (From admission, onward)   Start     Dose/Rate Route Frequency Ordered Stop   03/04/17 2345  doxycycline (VIBRA-TABS) tablet 100 mg     100 mg Oral 2 times daily 03/04/17 2336         Objective   Vitals:   03/05/17 2125 03/06/17 0529 03/06/17 0900 03/06/17 1012  BP: 124/78 130/71    Pulse: 63 86  85  Resp: 16 18  16   Temp: 97.9 F (36.6 C) 97.7 F (36.5 C)    TempSrc: Oral Oral    SpO2: 93% 95%  96%  Weight:  70.2 kg (154 lb 12.2 oz) 69.8 kg (153 lb 12.8 oz)   Height:        Intake/Output Summary (Last 24 hours) at 03/06/2017 1246 Last data filed at 03/06/2017 0535 Gross per 24 hour  Intake -  Output 450 ml  Net -450 ml   Filed Weights   03/05/17 0500 03/06/17 0529 03/06/17 0900  Weight: 71.3 kg (157 lb 3 oz) 70.2 kg (154 lb 12.2 oz) 69.8 kg (153 lb 12.8 oz)     Physical Examination:    Physical Exam: Eyes: No icterus,  extraocular muscles intact  Mouth: Oral mucosa is moist, no lesions on palate,  Neck: Supple, no deformities, masses, or tenderness Lungs: Normal respiratory effort, bilateral rhonchi Heart: Regular rate and rhythm, S1 and S2 normal, no murmurs, rubs auscultated Abdomen: BS normoactive,soft,nondistended,non-tender to palpation,no organomegaly Extremities: No pretibial edema, no erythema, no cyanosis, no clubbing Neuro : Alert and oriented to time, place and person, No focal deficits  Skin: No rashes seen on exam     Data Reviewed: I have personally  reviewed following labs and imaging studies  CBG: Recent Labs  Lab 03/05/17 1213 03/05/17 1802 03/05/17 2123 03/06/17 0736 03/06/17 1149  GLUCAP 147* 255* 221* 201* 258*    CBC: Recent Labs  Lab 03/04/17 1849 03/05/17 0422 03/05/17 1233 03/06/17 0410  WBC 5.7 5.6 5.3 3.7*  NEUTROABS 4.5  --   --   --   HGB 10.4* 9.8* 10.6* 9.6*  HCT 32.3* 29.6* 32.7* 29.8*  MCV 90.5 89.4 90.3 89.2  PLT 162 176 191 371    Basic Metabolic Panel: Recent Labs  Lab 03/04/17 1849 03/05/17 0422 03/06/17 0410  NA 137 138 139  K 3.6 3.6 4.0  CL 104 103 103  CO2 25 28 26   GLUCOSE 106* 109* 256*  BUN 14 14 20   CREATININE 0.75 0.73 0.99  CALCIUM 8.2* 8.1* 7.9*  MG  --  2.0  --     Recent Results (from the past 240 hour(s))  Respiratory Panel by PCR     Status: Abnormal   Collection Time: 03/04/17  7:11 PM  Result Value Ref Range Status   Adenovirus NOT DETECTED NOT DETECTED Final   Coronavirus 229E NOT DETECTED NOT DETECTED Final   Coronavirus HKU1 NOT DETECTED NOT DETECTED Final   Coronavirus NL63 NOT DETECTED NOT DETECTED Final   Coronavirus OC43 NOT DETECTED NOT DETECTED Final   Metapneumovirus NOT DETECTED NOT DETECTED Final   Rhinovirus / Enterovirus DETECTED (A) NOT DETECTED Final   Influenza A NOT DETECTED NOT DETECTED Final   Influenza B NOT DETECTED NOT DETECTED Final   Parainfluenza Virus 1 NOT DETECTED NOT DETECTED Final   Parainfluenza Virus 2 NOT DETECTED NOT DETECTED Final   Parainfluenza Virus 3 NOT DETECTED NOT DETECTED Final   Parainfluenza Virus 4 NOT DETECTED NOT DETECTED Final   Respiratory Syncytial Virus NOT DETECTED NOT DETECTED Final   Bordetella pertussis NOT DETECTED NOT DETECTED Final   Chlamydophila pneumoniae NOT DETECTED NOT DETECTED Final   Mycoplasma pneumoniae NOT DETECTED NOT DETECTED Final    Comment: Performed at [facility], [address], [place], [place] [address]  Culture, Urine     Status: Abnormal (Preliminary result)    Collection Time: 03/05/17 11:52 AM  Result Value Ref Range Status   Specimen Description URINE, CLEAN CATCH  Final   Special Requests NONE  Final   Culture (A)  Final    40,000 COLONIES/mL UNIDENTIFIED ORGANISM Performed at [facility], [place] [address] [place], [place] [address]    Report Status PENDING  Incomplete     Liver Function Tests: Recent Labs  Lab 03/04/17 1849  AST 20  ALT 19  ALKPHOS 60  BILITOT 1.4*  PROT 6.2*  ALBUMIN 3.2*   No results for input(s): LIPASE, AMYLASE in the last 168 hours. No results for input(s): AMMONIA in the last 168 hours.  Cardiac Enzymes: Recent Labs  Lab 03/04/17 1849 03/05/17 0716 03/05/17 1233 03/05/17 1821  TROPONINI 0.05* 0.06* 0.05* 0.04*   BNP (  last 3 results) Recent Labs    02/14/17 0408 02/15/17 0557 03/04/17 1849  BNP 190.5* 184.2* 358.9*    ProBNP (last 3 results) Recent Labs    01/23/17 1156  PROBNP 2,015*      Studies: Dg Chest 2 View  Result Date: 03/04/2017 CLINICAL DATA:  Dyspnea and cough starting yesterday EXAM: CHEST  2 VIEW COMPARISON:  02/21/2016 FINDINGS: Status post CABG with borderline cardiomegaly. Aortic atherosclerosis is noted. New bilateral left greater than right pleural effusions with pulmonary vascular redistribution and bilateral airspace opacities are consistent with mild to moderate CHF. Superimposed pneumonia would be difficult to entirely exclude but given the bilaterality and diffuse distribution of the airspace opacities, findings are more in keeping with CHF. No acute osseous abnormality. Degenerative changes are noted along the dorsal spine. IMPRESSION: CHF with bilateral left greater than right pleural effusions. Status post CABG with aortic atherosclerosis. Electronically Signed   By: Ashley Royalty M.D.   On: 03/04/2017 19:14   Ct Head Wo Contrast  Result Date: 03/05/2017 CLINICAL DATA:  Fall 2 days ago. EXAM: CT HEAD WITHOUT CONTRAST TECHNIQUE: Contiguous axial images were  obtained from the base of the skull through the vertex without intravenous contrast. COMPARISON:  02/06/2017 FINDINGS: Brain: There is atrophy and chronic small vessel disease changes. No acute intracranial abnormality. Specifically, no hemorrhage, hydrocephalus, mass lesion, acute infarction, or significant intracranial injury. Vascular: No hyperdense vessel or unexpected calcification. Skull: No acute calvarial abnormality. Sinuses/Orbits: Small air-fluid level in the right maxillary sinus. Remainder the paranasal sinuses are clear. Mastoid air cells are clear. Orbital soft tissues unremarkable. Other: None IMPRESSION: No acute intracranial abnormality. Atrophy, chronic microvascular disease. Small air-fluid level in the right maxillary sinus may reflect acute sinusitis. Electronically Signed   By: Rolm Baptise M.D.   On: 03/05/2017 08:21    Scheduled Meds: . allopurinol  300 mg Oral Daily  . amitriptyline  20 mg Oral QHS  . benzonatate  100 mg Oral TID  . bisacodyl  10 mg Oral Daily  . citalopram  20 mg Oral BID  . clopidogrel  75 mg Oral Daily  . digoxin  0.125 mg Oral Q M,W,F  . donepezil  10 mg Oral Daily  . doxycycline  100 mg Oral BID  . famotidine  20 mg Oral Daily  . furosemide  40 mg Intravenous BID  . furosemide  40 mg Oral Daily  . guaiFENesin  1,200 mg Oral BID  . insulin aspart  0-9 Units Subcutaneous TID WC  . ipratropium  2 spray Nasal TID  . ipratropium-albuterol  3 mL Nebulization TID  . levothyroxine  25 mcg Oral QAC breakfast  . memantine  28 mg Oral Daily  . methylPREDNISolone (SOLU-MEDROL) injection  40 mg Intravenous Q12H  . metoprolol succinate  25 mg Oral Daily  . mometasone-formoterol  2 puff Inhalation BID  . potassium chloride SA  10 mEq Oral Daily  . warfarin  5 mg Oral ONCE-1800  . Warfarin - Pharmacist Dosing Inpatient   Does not apply q1800      Time spent: 30 min  [place] Pager [phone]. If 7PM-7AM, please contact  night-coverage at [email or web address], Office  [phone]  password [email or web address]  03/06/2017, 12:46 PM  LOS: 2 days

## 2017-03-06 NOTE — Progress Notes (Addendum)
Name: Amber [name] MRN: [id number] DOB: 1934-09-30    ADMISSION DATE:  03/04/2017 CONSULTATION DATE:  03/05/17  REFERRING MD :  Dr. Darrick Meigs / [facility]   CHIEF COMPLAINT:  Progressive shortness of breath    HISTORY OF PRESENT ILLNESS:  83 y/o F admitted on 1/23 with reports of progressive shortness of breath.    The patient was recently admitted from 12/28-1/8 for hypoxic respiratory failure with concern for possible pneumonia, COPD, bronchitis, CHF (per the discharge note review).  She was also treated for GERD.  Course notable for heme positive stool (on Coumadin for AF). MBS completed 1/8 demonstrated mild aspiration risk.  At that time, she was discharged on 20 mg lasix QD with plans for follow up with her PCP, Pulmonary & Cardiology. Her discharge weight recorded was 156lbs.    She was seen at an urgent care on 1/22 and given antibiotics but a CXR was not performed.  Appt was arranged for follow up at Pulmonary for 1/25.  The patient's daughter called Cardiology with similar concerns of dyspnea and reported that she was up 7lbs.  She returned to [facility] on 1/23 with progressive shortness of breath.  She was admitted per [facility]. The patient fell two days prior to admit. CT of the head was negative (pt is on anticoagulation for AF).   CXR was concerning for CHF.   Cardiology consulted.  I/O documentation does not appear to be correct (no intake, 600 ml output).  Pulmonary consulted 1/24 for evaluation of dyspnea.      SUBJECTIVE:  Feels better Ambulated with PT  VITAL SIGNS: Temp:  [97.7 F (36.5 C)-97.9 F (36.6 C)] 97.7 F (36.5 C) (01/25 0529) Pulse Rate:  [63-86] 85 (01/25 1012) Resp:  [16-18] 16 (01/25 1012) BP: (124-130)/(71-78) 130/71 (01/25 0529) SpO2:  [91 %-98 %] 96 % (01/25 1012) Weight:  [153 lb 12.8 oz (69.8 kg)-154 lb 12.2 oz (70.2 kg)] 153 lb 12.8 oz (69.8 kg) (01/25 0900)  PHYSICAL EXAMINATION: General:  Elderly, no distress,  HEENT: MM pink/moist PSY: normal  affect Neuro: alert, interactive, non focal CV: s1s2 rrr, no m/r/g PULM: even/non-labored, decreased bilateral scattered rhonchi TI:RWER, non-tender, bsx4 active  Extremities: warm/dry, no edema  Skin: no rashes or lesions  Recent Labs  Lab 03/04/17 1849 03/05/17 0422 03/06/17 0410  NA 137 138 139  K 3.6 3.6 4.0  CL 104 103 103  CO2 25 28 26   BUN 14 14 20   CREATININE 0.75 0.73 0.99  GLUCOSE 106* 109* 256*    Recent Labs  Lab 03/05/17 0422 03/05/17 1233 03/06/17 0410  HGB 9.8* 10.6* 9.6*  HCT 29.6* 32.7* 29.8*  WBC 5.6 5.3 3.7*  PLT 176 191 186    Dg Chest 2 View  Result Date: 03/04/2017 CLINICAL DATA:  Dyspnea and cough starting yesterday EXAM: CHEST  2 VIEW COMPARISON:  02/21/2016 FINDINGS: Status post CABG with borderline cardiomegaly. Aortic atherosclerosis is noted. New bilateral left greater than right pleural effusions with pulmonary vascular redistribution and bilateral airspace opacities are consistent with mild to moderate CHF. Superimposed pneumonia would be difficult to entirely exclude but given the bilaterality and diffuse distribution of the airspace opacities, findings are more in keeping with CHF. No acute osseous abnormality. Degenerative changes are noted along the dorsal spine. IMPRESSION: CHF with bilateral left greater than right pleural effusions. Status post CABG with aortic atherosclerosis. Electronically Signed   By: Ashley Royalty M.D.   On: 03/04/2017 19:14   Ct Head Wo Contrast  Result Date: 03/05/2017 CLINICAL DATA:  Fall 2 days ago. EXAM: CT HEAD WITHOUT CONTRAST TECHNIQUE: Contiguous axial images were obtained from the base of the skull through the vertex without intravenous contrast. COMPARISON:  02/06/2017 FINDINGS: Brain: There is atrophy and chronic small vessel disease changes. No acute intracranial abnormality. Specifically, no hemorrhage, hydrocephalus, mass lesion, acute infarction, or significant intracranial injury. Vascular: No hyperdense  vessel or unexpected calcification. Skull: No acute calvarial abnormality. Sinuses/Orbits: Small air-fluid level in the right maxillary sinus. Remainder the paranasal sinuses are clear. Mastoid air cells are clear. Orbital soft tissues unremarkable. Other: None IMPRESSION: No acute intracranial abnormality. Atrophy, chronic microvascular disease. Small air-fluid level in the right maxillary sinus may reflect acute sinusitis. Electronically Signed   By: Rolm Baptise M.D.   On: 03/05/2017 08:21    SIGNIFICANT EVENTS  1/23  Admit with progressive SOB 1/24  PCCM consulted    STUDIES:  ECHO 02/09/17 >> LVEF 60-65%, mild AV stenosis, LA moderately dilated, moderate TR, PA peak pressure 52   CULTURES RVP 1/23 >> Rhinovirus positive  UC 1/24 >>   ANTIBIOTICS   ASSESSMENT / PLAN:  Discussion:  83 y/o F admitted 1/23 with progressive SOB.  Recent admit from 12/28-1/8 for hypoxic respiratory failure that appeared to be multifactorial in nature.  Diuretics were adjusted at discharge (reduced).    Chest x-ray suggests acute pulmonary edema, however exam with bilateral rhonchi and no evidence of fluid overload-more suggestive of COPD exacerbation. PFTs from the past show airway obstruction but she seems to have a less than 10 pack year history of smoking and quit in 1979 Echo shows moderate pulmonary hypertension and normal valves  Recommend-ct diuretics for pulmonary edema  -drop  IV Solu-Medrol 40 every 12  -change to 40 mg pred 1/26 if continues to improve Given her recent prior admission and persistent bronchospasm, may need longer steroid taper this time over 3-4 weeks -CXR today  Will arrange pulm office FU  Available as needed  Alayasia Breeding V. Elsworth Soho MD 03/06/2017, 11:43 AM

## 2017-03-07 LAB — URINE CULTURE: Culture: 40000 — AB

## 2017-03-07 LAB — GLUCOSE, CAPILLARY
Glucose-Capillary: 170 mg/dL — ABNORMAL HIGH (ref 65–99)
Glucose-Capillary: 212 mg/dL — ABNORMAL HIGH (ref 65–99)

## 2017-03-07 LAB — BASIC METABOLIC PANEL
ANION GAP: 9 (ref 5–15)
BUN: 28 mg/dL — ABNORMAL HIGH (ref 6–20)
CHLORIDE: 106 mmol/L (ref 101–111)
CO2: 25 mmol/L (ref 22–32)
Calcium: 8.3 mg/dL — ABNORMAL LOW (ref 8.9–10.3)
Creatinine, Ser: 1.04 mg/dL — ABNORMAL HIGH (ref 0.44–1.00)
GFR calc non Af Amer: 49 mL/min — ABNORMAL LOW (ref >60)
GFR, EST AFRICAN AMERICAN: 57 mL/min — AB (ref >60)
Glucose, Bld: 176 mg/dL — ABNORMAL HIGH (ref 65–99)
POTASSIUM: 3.9 mmol/L (ref 3.5–5.1)
SODIUM: 140 mmol/L (ref 135–145)

## 2017-03-07 LAB — PROTIME-INR
INR: 3.35
PROTHROMBIN TIME: 33.7 s — AB (ref 11.4–15.2)

## 2017-03-07 MED ORDER — LOSARTAN POTASSIUM 25 MG PO TABS: 25.0000 mg | ORAL_TABLET | Freq: Every day | ORAL | 3 refills | Status: DC

## 2017-03-07 MED ORDER — DOXYCYCLINE HYCLATE 100 MG PO CAPS: 100.0000 mg | ORAL_CAPSULE | Freq: Two times a day (BID) | ORAL | 0 refills | Status: AC

## 2017-03-07 MED ORDER — NITROFURANTOIN MONOHYD MACRO 100 MG PO CAPS: 100.0000 mg | ORAL_CAPSULE | Freq: Two times a day (BID) | ORAL | 0 refills | Status: AC

## 2017-03-07 MED ORDER — BENZONATATE 100 MG PO CAPS: 100.0000 mg | ORAL_CAPSULE | Freq: Three times a day (TID) | ORAL | 0 refills | Status: DC | PRN

## 2017-03-07 MED ORDER — PREDNISONE 10 MG PO TABS: ORAL_TABLET | ORAL | 0 refills | Status: DC

## 2017-03-07 NOTE — Progress Notes (Addendum)
Progress Note  Patient Name: Amber [name] Date of Encounter: 03/07/2017  Primary Cardiologist: Sinclair Grooms, MD   Subjective   No complaints  Inpatient Medications    Scheduled Meds: . allopurinol  300 mg Oral Daily  . amitriptyline  20 mg Oral QHS  . benzonatate  100 mg Oral TID  . bisacodyl  10 mg Oral Daily  . citalopram  20 mg Oral BID  . clopidogrel  75 mg Oral Daily  . digoxin  0.125 mg Oral Q M,W,F  . donepezil  10 mg Oral Daily  . doxycycline  100 mg Oral BID  . famotidine  20 mg Oral Daily  . furosemide  40 mg Oral Daily  . guaiFENesin  1,200 mg Oral BID  . insulin aspart  0-9 Units Subcutaneous TID WC  . ipratropium  2 spray Nasal TID  . ipratropium-albuterol  3 mL Nebulization TID  . levothyroxine  25 mcg Oral QAC breakfast  . memantine  28 mg Oral Daily  . methylPREDNISolone (SOLU-MEDROL) injection  40 mg Intravenous Q12H  . metoprolol succinate  25 mg Oral Daily  . mometasone-formoterol  2 puff Inhalation BID  . potassium chloride SA  10 mEq Oral Daily  . Warfarin - Pharmacist Dosing Inpatient   Does not apply q1800   Continuous Infusions:  PRN Meds: acetaminophen **OR** acetaminophen, albuterol, nitroGLYCERIN, ondansetron **OR** ondansetron (ZOFRAN) IV   Vital Signs    Vitals:   03/06/17 1402 03/06/17 2126 03/06/17 2150 03/07/17 0538  BP:   (!) 134/52 (!) 158/58  Pulse: 75  61 68  Resp: 16  16 16   Temp:   97.9 F (36.6 C) 97.6 F (36.4 C)  TempSrc:   Oral Oral  SpO2: 98% 97% 97% 98%  Weight:    154 lb 8.7 oz (70.1 kg)  Height:        Intake/Output Summary (Last 24 hours) at 03/07/2017 0839 Last data filed at 03/07/2017 0538 Gross per 24 hour  Intake 480 ml  Output 300 ml  Net 180 ml   Filed Weights   03/06/17 0529 03/06/17 0900 03/07/17 0538  Weight: 154 lb 12.2 oz (70.2 kg) 153 lb 12.8 oz (69.8 kg) 154 lb 8.7 oz (70.1 kg)    Telemetry    Atrial fibrillation with CVR - Personally Reviewed  ECG    No new EKG to review -  Personally Reviewed  Physical Exam   GEN: No acute distress.   Neck: No JVD Cardiac: irregulalry irregular, no murmurs, rubs, or gallops.  Respiratory: Clear to auscultation bilaterally. GI: Soft, nontender, non-distended  MS: No edema; No deformity. Neuro:  Nonfocal  Psych: Normal affect   Labs    Chemistry Recent Labs  Lab 03/04/17 1849 03/05/17 0422 03/06/17 0410 03/07/17 0452  NA 137 138 139 140  K 3.6 3.6 4.0 3.9  CL 104 103 103 106  CO2 25 28 26 25   GLUCOSE 106* 109* 256* 176*  BUN 14 14 20  28*  CREATININE 0.75 0.73 0.99 1.04*  CALCIUM 8.2* 8.1* 7.9* 8.3*  PROT 6.2*  --   --   --   ALBUMIN 3.2*  --   --   --   AST 20  --   --   --   ALT 19  --   --   --   ALKPHOS 60  --   --   --   BILITOT 1.4*  --   --   --   GFRNONAA >  60 >60 52* 49*  GFRAA >60 >60 >60 57*  ANIONGAP 8 7 10 9      Hematology Recent Labs  Lab 03/05/17 0422 03/05/17 0716 03/05/17 1233 03/06/17 0410  WBC 5.6  --  5.3 3.7*  RBC 3.31* 3.19* 3.62* 3.34*  HGB 9.8*  --  10.6* 9.6*  HCT 29.6*  --  32.7* 29.8*  MCV 89.4  --  90.3 89.2  MCH 29.6  --  29.3 28.7  MCHC 33.1  --  32.4 32.2  RDW 16.4*  --  16.3* 15.9*  PLT 176  --  191 186    Cardiac Enzymes Recent Labs  Lab 03/04/17 1849 03/05/17 0716 03/05/17 1233 03/05/17 1821  TROPONINI 0.05* 0.06* 0.05* 0.04*   No results for input(s): TROPIPOC in the last 168 hours.   BNP Recent Labs  Lab 03/04/17 1849  BNP 358.9*     DDimer No results for input(s): DDIMER in the last 168 hours.   Radiology    Dg Chest 2 View  Result Date: 03/06/2017 CLINICAL DATA:  83 year old female with a history shortness of breath EXAM: CHEST  2 VIEW COMPARISON:  03/04/2017, 02/20/2017 FINDINGS: Cardiomediastinal silhouette unchanged in size and contour. Surgical changes of median sternotomy and CABG. Blunting of the left greater than right costophrenic angle. Lateral view demonstrates small meniscus in the costophrenic sulcus. Stigmata of emphysema,  with increased retrosternal airspace, flattened hemidiaphragms, increased AP diameter, and hyperinflation on the AP view. Interlobular septal thickening.  No pneumothorax. Degenerative changes of the spine. Unchanged midthoracic compression fracture. IMPRESSION: Persisting small left greater than right pleural effusion with associated atelectasis, and improving pulmonary edema. Surgical changes of median sternotomy and CABG. Electronically Signed   By: Corrie Mckusick D.O.   On: 03/06/2017 15:19    Cardiac Studies   None this admit  Patient Profile     83 y.o. female w/ hx CAD s/p CABG in 2000, chronic atrial fibrillation, D-CHF,COPD,CKD stage III, HLD (statin intolerant), and HTN with hypertensive heart disease, was admitted 01/23 w/ SOB, wt up 7 lbs, COPD exacerbation    Assessment & Plan    1.  Acute on chronic diastolic (congestive) heart failure (HCC) - wt up 1 lbs from yesterday but overall down 3lbs. - dry wt at home approx 150, pt has orthotics/brace on so feel she is close to that - Cxray shows improvement in CHF - continue Lasix PO 40 mg qd  2. Elevated troponin - mild elevation c/w CHF, no further eval planned.  3. Chronic afib - HR is controlled on home dose metoprolol and dig  4. Anticoag - coumadin is therapeutic at 3.35  No new recs at this time.   Will sign off.  Call with any questions.  For questions or updates, please contact [place] Please consult [email or web address] for contact info under Cardiology/STEMI.      Signed, Fransico Him, MD  03/07/2017, 8:39 AM

## 2017-03-07 NOTE — Progress Notes (Addendum)
ANTICOAGULATION CONSULT NOTE - [place] for Warfarin Indication: atrial fibrillation, hx of CVA  Allergies  Allergen Reactions  . Ezetimibe Other (See Comments)    Myalgia  . Welchol [Colesevelam Hcl] Other (See Comments)    Muscle aches  . Adhesive [Tape] Itching, Rash and Other (See Comments)    Burning   . Ceclor [Cefaclor] Other (See Comments)    Unknown allergic reaction  . Elastic Bandages & [Zinc] Rash and Other (See Comments)    Turns red on the areas it touches  . Latex Itching, Rash and Other (See Comments)    Burning   . Lipitor [Atorvastatin Calcium] Other (See Comments)    Increased fibromyalgia pain  . Mevacor [Lovastatin] Other (See Comments)    Increased fibromyalgia pain  . Pravachol Other (See Comments)    Increased fibromyalgia pain  . Vasotec Other (See Comments)    Unknown allergic reaction    Patient Measurements: Height: 5\' 4"  (162.6 cm) Weight: 154 lb 8.7 oz (70.1 kg) IBW/kg (Calculated) : 54.7   Vital Signs: Temp: 97.6 F (36.4 C) (01/26 0538) Temp Source: Oral (01/26 0538) BP: 158/58 (01/26 0538) Pulse Rate: 68 (01/26 0538)  Labs: Recent Labs    03/05/17 0422 03/05/17 0716 03/05/17 1233 03/05/17 1821 03/06/17 0410 03/07/17 0452  HGB 9.8*  --  10.6*  --  9.6*  --   HCT 29.6*  --  32.7*  --  29.8*  --   PLT 176  --  191  --  186  --   LABPROT 27.3*  --   --   --  28.6* 33.7*  INR 2.56  --   --   --  2.72 3.35  CREATININE 0.73  --   --   --  0.99 1.04*  TROPONINI  --  0.06* 0.05* 0.04*  --   --     Estimated Creatinine Clearance: 41.5 mL/min (A) (by C-G formula based on SCr of 1.04 mg/dL (H)).   Assessment: 82 y/oF on chronic warfarin at home for atrial fibrillation and history of CVA admitted with c/o shortness of breath, fatigue, wheezing, and productive cough. Pharmacy consulted to dose warfarin while patient in the hospital. Home dose of warfarin reported as 5 mg daily except 7.5 mg on Tuesdays and  Saturdays with last dose 1/22 at 0830. INR therapeutic on admission.   Today, 03/07/17:   INR = 3.35, supratherapeutic  CBC: Hgb 9.6, Pltc WNL (on 03/06/17)  Drug interactions: Doxycycline, steroids, plavix  CT of head: No acute intracranial abnormality  FOBT negative  Patient's daughter reports patient having some blood tinged sputum prior to coming into hospital, none since admission  No bleeding issues reported currently  Goal of Therapy:  INR 2-3   Plan:   Hold warfarin today  Daily PT/INR  Monitor closely for s/sx of bleeding   Lindell Spar, PharmD, BCPS Pager: [phone] 03/07/2017 10:09 AM

## 2017-03-07 NOTE — Progress Notes (Signed)
Notified [facility] of scheduled dc home today with HH. Jonnie Finner RN CCM Case Mgmt phone [phone]

## 2017-03-07 NOTE — Discharge Summary (Signed)
Physician Discharge Summary  Amber [name] XTG:[id number] DOB: 10/21/1934 DOA: 03/04/2017  PCP: Antony Contras, MD  Admit date: 03/04/2017 Discharge date: 03/07/2017  Time spent: 25* minutes  Recommendations for Outpatient Follow-up:  1. Follow up cardiology in 1 week 2. Follow up Pulmonology in 2 weeks 3. Check PT/INR in 3 days    Discharge Diagnoses:  Principal Problem:   Acute on chronic diastolic (congestive) heart failure (HCC) Active Problems:   Atrial fibrillation (HCC)   Fibromyalgia   Hypothyroidism   Hemiparesis and alteration of sensations as late effects of stroke (HCC)   COPD with acute exacerbation (HCC)   CHF (congestive heart failure) ([place])   Palliative care encounter   Acute pulmonary edema [facility])   Discharge Condition: Stable  Diet recommendation: Heart healthy diet  Filed Weights   03/06/17 0529 03/06/17 0900 03/07/17 0538  Weight: 70.2 kg (154 lb 12.2 oz) 69.8 kg (153 lb 12.8 oz) 70.1 kg (154 lb 8.7 oz)    History of present illness:  83 y.o.femalewithhistory of diastolic CHF, COPD, chronic atrial fibrillation, CAD, gout was brought to the ER after patient was found to be having increasing fatigue and shortness of breath. Patient was just recently admitted 3 weeks ago for respiratory failure and at that time was treated for CHF and pneumonia. Patient following which has had follow-up with cardiology and pulmonologist. Patient at discharge was on Lasix 20 mg which was increased back to 40 mg by cardiologist shortly after discharge. Denies any chest pain has been having some wheezing and productive cough for which patient had gone to urgent care center 2 days ago and was prescribed doxycycline. Since patient symptoms was gradually and progressively worsening patient was brought to the ER.      Hospital Course:   1. Acute on chronic diastolic CHF exacerbation-  Resolved, not requiring oxygen, patient presents with BNP of 358.9, mild elevation of  troponin 0.05, 0.06. She was started on  Lasix 40 mg IV q 12 hr and now Lasix has been changed to 40 mg po daily per  cardiology 2. Acute bronchitis/COPD exacerbation- patient has significant rhonchi, has been coughing up phlegm. She was discharged on prednisone taper during previous admission. Respiratory panel positive for rhinovirus/enterovirus. Patient was started on IV  Solu-Medrol 40 mg IV every 8 hours, which has been changed to 40 mg q 12 hr. Continue Mucinex 1 tablet by mouth twice a day, DuoNeb nebulizers every 6 hours. Continue doxycycline for 2 more days, will discharge on prednisone taper for 2 weeks. 3. Atrial fibrillation- heart rate is controlled, continue metoprolol, Coumadin per pharmacy consultation. 4. Normocytic normochromic anemia- FOBT is negative, hemoglobin is stable at 9.6.  5. CAD-mild elevation of troponin, continue Plavix, Toprol-XL. Patient is intolerant to statins. 6. Hypothyroidism-continue Synthroid 7. UTI- urine culture is growing 40,000 colonies of Staph Epidermidis, sensitive to nitrofurantoin. Will discharge on Macrobid 100 mg po bid for seven days. 8. Recent fall-CT head was negative for bleed 9. History of gout-continue allopurinol    Procedures:  None   Consultations:  Cardiology  Pulmonology   Discharge Exam: Vitals:   03/06/17 2150 03/07/17 0538  BP: (!) 134/52 (!) 158/58  Pulse: 61 68  Resp: 16 16  Temp: 97.9 F (36.6 C) 97.6 F (36.4 C)  SpO2: 97% 98%    General: Appears in no acute distress Cardiovascular: S1S2 RRR Respiratory: Clear bilaterally  Discharge Instructions    Allergies as of 03/07/2017      Reactions   Ezetimibe  Other (See Comments)   Myalgia   Welchol [colesevelam Hcl] Other (See Comments)   Muscle aches   Adhesive [tape] Itching, Rash, Other (See Comments)   Burning    Ceclor [cefaclor] Other (See Comments)   Unknown allergic reaction   Elastic Bandages & [zinc] Rash, Other (See Comments)   Turns red on  the areas it touches   Latex Itching, Rash, Other (See Comments)   Burning    Lipitor [atorvastatin Calcium] Other (See Comments)   Increased fibromyalgia pain   Mevacor [lovastatin] Other (See Comments)   Increased fibromyalgia pain   Pravachol Other (See Comments)   Increased fibromyalgia pain   Vasotec Other (See Comments)   Unknown allergic reaction      Medication List    TAKE these medications   allopurinol 300 MG tablet Commonly known as:  ZYLOPRIM Take 1 tablet (300 mg total) by mouth every other day. What changed:  when to take this   amitriptyline 10 MG tablet Commonly known as:  ELAVIL TAKE 2 TABLETS BY MOUTH AT BEDTIME What changed:    how much to take  how to take this  when to take this   amLODipine 5 MG tablet Commonly known as:  NORVASC Take 1 tablet (5 mg total) by mouth at bedtime.   benzonatate 100 MG capsule Commonly known as:  TESSALON Take 1 capsule (100 mg total) by mouth 3 (three) times daily as needed for cough. What changed:    when to take this  reasons to take this   bisacodyl 5 MG EC tablet Commonly known as:  DULCOLAX Take 2 tablets (10 mg total) by mouth daily.   celecoxib 200 MG capsule Commonly known as:  CELEBREX Take 200 mg by mouth every morning.   citalopram 20 MG tablet Commonly known as:  CELEXA TAKE 1 TABLET BY MOUTH TWICE A DAY   clopidogrel 75 MG tablet Commonly known as:  PLAVIX Take 75 mg by mouth daily.   COUMADIN 5 MG tablet Generic drug:  warfarin Take as directed. If you are unsure how to take this medication, talk to your nurse or doctor. Original instructions:  TAKE AS DIRECTED BY COUMADIN CLINIC. Hold coumadin November 24 th and November 25 th.  Recheck INR on Monday. What changed:    how much to take  how to take this  when to take this  additional instructions   digoxin 0.125 MG tablet Commonly known as:  LANOXIN Take 1 tablet (0.125 mg total) by mouth on Mondays, Wednesdays, & Fridays    donepezil 10 MG tablet Commonly known as:  ARICEPT Take 1 tablet (10 mg total) by mouth at bedtime. What changed:  when to take this   doxycycline 100 MG capsule Commonly known as:  VIBRAMYCIN Take 1 capsule (100 mg total) by mouth 2 (two) times daily for 2 days.   DULERA 200-5 MCG/ACT Aero Generic drug:  mometasone-formoterol Inhale 2 puffs into the lungs 2 (two) times daily.   ergocalciferol 50000 units capsule Commonly known as:  VITAMIN D2 Take 50,000 Units by mouth 2 (two) times a week. Tuesday and Friday   famotidine 20 MG tablet Commonly known as:  PEPCID Take 1 tablet (20 mg total) by mouth daily.   fluticasone 50 MCG/ACT nasal spray Commonly known as:  FLONASE Place 2 sprays into both nostrils 2 (two) times daily.   furosemide 40 MG tablet Commonly known as:  LASIX Take 1 tablet (40 mg total) by mouth daily.   guaiFENesin  600 MG 12 hr tablet Commonly known as:  MUCINEX Take 2 tablets (1,200 mg total) by mouth 2 (two) times daily.   ipratropium 0.03 % nasal spray Commonly known as:  ATROVENT Place 2 sprays into the nose 3 (three) times daily.   ipratropium-albuterol 0.5-2.5 (3) MG/3ML Soln Commonly known as:  DUONEB Take 3 mLs by nebulization 3 (three) times daily.   KLOR-CON M20 20 MEQ tablet Generic drug:  potassium chloride SA Take 0.5 tablets (10 mEq total) by mouth daily.   levothyroxine 25 MCG tablet Commonly known as:  SYNTHROID, LEVOTHROID Take 25 mcg by mouth every morning.   losartan 25 MG tablet Commonly known as:  COZAAR Take 1 tablet (25 mg total) by mouth daily. What changed:    medication strength  how much to take   memantine 28 MG Cp24 24 hr capsule Commonly known as:  NAMENDA XR TAKE ONE CAPSULE BY MOUTH EVERY DAY   metoprolol succinate 25 MG 24 hr tablet Commonly known as:  TOPROL-XL TAKE 1 TABLET (25 MG TOTAL) BY MOUTH DAILY. What changed:  See the new instructions.   nitrofurantoin (macrocrystal-monohydrate) 100 MG  capsule Commonly known as:  MACROBID Take 1 capsule (100 mg total) by mouth 2 (two) times daily for 7 days.   nitroGLYCERIN 0.4 MG SL tablet Commonly known as:  NITROSTAT Place 1 tablet (0.4 mg total) under the tongue every 5 (five) minutes as needed for chest pain.   polyethylene glycol packet Commonly known as:  MIRALAX / GLYCOLAX Take 17 g by mouth daily.   predniSONE 10 MG tablet Commonly known as:  DELTASONE Prednisone 40 mg po daily x 4 day then Prednisone 30 mg po daily x 4 day then Prednisone 20 mg po daily x 4 day then Prednisone 10 mg daily x 2 day then stop...   PROAIR HFA 108 (90 Base) MCG/ACT inhaler Generic drug:  albuterol INHALE 2 PUFFS INTO LUNGS EVERY 6 HOURS AS NEEDED FOR WHEEZING      Allergies  Allergen Reactions  . Ezetimibe Other (See Comments)    Myalgia  . Welchol [Colesevelam Hcl] Other (See Comments)    Muscle aches  . Adhesive [Tape] Itching, Rash and Other (See Comments)    Burning   . Ceclor [Cefaclor] Other (See Comments)    Unknown allergic reaction  . Elastic Bandages & [Zinc] Rash and Other (See Comments)    Turns red on the areas it touches  . Latex Itching, Rash and Other (See Comments)    Burning   . Lipitor [Atorvastatin Calcium] Other (See Comments)    Increased fibromyalgia pain  . Mevacor [Lovastatin] Other (See Comments)    Increased fibromyalgia pain  . Pravachol Other (See Comments)    Increased fibromyalgia pain  . Vasotec Other (See Comments)    Unknown allergic reaction   Follow-up Information    Juanito Doom, MD .   Specialty:  Pulmonary Disease Contact information: [address] [place] [address] [phone]        Melvenia Needles, NP Follow up on 03/19/2017.   Specialty:  Pulmonary Disease Why:  Appt at 11:00 AM Contact information: [address] [place] [address] [phone]            The results of significant diagnostics from this hospitalization (including imaging,  microbiology, ancillary and laboratory) are listed below for reference.    Significant Diagnostic Studies: Dg Chest 1 View  Result Date: 02/15/2017 CLINICAL DATA:  Shortness of breath, cough,  congestion EXAM: CHEST 1 VIEW COMPARISON:  02/13/2017 FINDINGS: There is no focal parenchymal opacity. There is no pleural effusion or pneumothorax. The heart and mediastinal contours are unremarkable. There is evidence of prior CABG. The osseous structures are unremarkable. IMPRESSION: No active disease. Electronically Signed   By: Kathreen Devoid   On: 02/15/2017 13:23   Dg Chest 2 View  Result Date: 03/06/2017 CLINICAL DATA:  83 year old female with a history shortness of breath EXAM: CHEST  2 VIEW COMPARISON:  03/04/2017, 02/20/2017 FINDINGS: Cardiomediastinal silhouette unchanged in size and contour. Surgical changes of median sternotomy and CABG. Blunting of the left greater than right costophrenic angle. Lateral view demonstrates small meniscus in the costophrenic sulcus. Stigmata of emphysema, with increased retrosternal airspace, flattened hemidiaphragms, increased AP diameter, and hyperinflation on the AP view. Interlobular septal thickening.  No pneumothorax. Degenerative changes of the spine. Unchanged midthoracic compression fracture. IMPRESSION: Persisting small left greater than right pleural effusion with associated atelectasis, and improving pulmonary edema. Surgical changes of median sternotomy and CABG. Electronically Signed   By: Corrie Mckusick D.O.   On: 03/06/2017 15:19   Dg Chest 2 View  Result Date: 03/04/2017 CLINICAL DATA:  Dyspnea and cough starting yesterday EXAM: CHEST  2 VIEW COMPARISON:  02/21/2016 FINDINGS: Status post CABG with borderline cardiomegaly. Aortic atherosclerosis is noted. New bilateral left greater than right pleural effusions with pulmonary vascular redistribution and bilateral airspace opacities are consistent with mild to moderate CHF. Superimposed pneumonia would be  difficult to entirely exclude but given the bilaterality and diffuse distribution of the airspace opacities, findings are more in keeping with CHF. No acute osseous abnormality. Degenerative changes are noted along the dorsal spine. IMPRESSION: CHF with bilateral left greater than right pleural effusions. Status post CABG with aortic atherosclerosis. Electronically Signed   By: Ashley Royalty M.D.   On: 03/04/2017 19:14   Dg Chest 2 View  Result Date: 02/20/2017 CLINICAL DATA:  Pneumonia.  Cough. EXAM: CHEST  2 VIEW COMPARISON:  February 15, 2017 FINDINGS: The heart, hila, and mediastinum are stable. A small left effusion or pleural thickening is slightly more prominent. No nodules or masses. No suspicious infiltrates. Anterior wedging of 2 lower thoracic vertebral bodies is stable since February 13, 2017. Anterior wedging of a midthoracic vertebral body is also stable. IMPRESSION: 1. The small left effusion with underlying opacity is mildly more prominent. 2. Anterior wedging of a midthoracic vertebral body into lower thoracic vertebral bodies, unchanged since February 13, 2017. Electronically Signed   By: Dorise Bullion III M.D   On: 02/20/2017 13:25   Dg Chest 2 View  Result Date: 02/13/2017 CLINICAL DATA:  Hypoxemia.  Pneumonia.  History of COPD. EXAM: CHEST  2 VIEW COMPARISON:  02/12/2017 FINDINGS: Sequelae of prior CABG are again identified. The cardiac silhouette is borderline enlarged. There is a persistent small left pleural effusion. Mild hazy density in the left lung base on yesterday's study has improved. The right lung remains clear. No pneumothorax is identified. Chronic thoracic and lumbar spine compression fractures are partially visualized. IMPRESSION: Persistent small left pleural effusion. Improved aeration of the left lung base without definite evidence of pneumonia. Electronically Signed   By: Logan Bores M.D.   On: 02/13/2017 13:14   Dg Chest 2 View  Result Date: 02/08/2017 CLINICAL DATA:   Respiratory distress EXAM: CHEST  2 VIEW COMPARISON:  02/06/2017 chest radiograph. FINDINGS: Intact sternotomy wires. CABG clips overlie the mediastinum. Stable cardiomediastinal silhouette with borderline mild cardiomegaly. No pneumothorax. Small  left pleural effusion, stable. No right pleural effusion. New patchy opacity in the peripheral mid to upper right lung. IMPRESSION: 1. New patchy opacity in the peripheral mid to upper right lung, which could represent a developing pneumonia or mild asymmetric pulmonary edema . Recommend attention on follow-up chest radiographs. 2. Borderline mild cardiomegaly, stable. 3. Stable small left pleural effusion. Electronically Signed   By: Ilona Sorrel M.D.   On: 02/08/2017 11:03   Dg Wrist Complete Right  Result Date: 02/06/2017 CLINICAL DATA:  Fall.  Right-sided wrist pain. EXAM: RIGHT WRIST - COMPLETE 3+ VIEW COMPARISON:  None. FINDINGS: Advanced degenerative changes are again seen in the right wrist. Subluxation of the first [facility] joint is again noted. There is soft tissue swelling about the wrist. No acute fracture is evident. Vascular calcifications are noted. IMPRESSION: 1. Soft tissue swelling about the right wrist without acute fracture. 2. Advanced degenerative changes. 3. Atherosclerosis. Electronically Signed   By: San Morelle M.D.   On: 02/06/2017 08:00   Dg Abd 1 View  Result Date: 02/16/2017 CLINICAL DATA:  Abdominal distension EXAM: ABDOMEN - 1 VIEW COMPARISON:  02/13/2017 FINDINGS: Some mild retained fecal material is noted within the right colon. Significant improvement in the degree of constipation is noted. No obstructive changes are seen. No free air is noted. Degenerative changes of lumbar spine are noted. Chronic Compression deformity of T12 and L1 are seen. IMPRESSION: Significant improvement in the degree of constipation. Electronically Signed   By: Inez Catalina M.D.   On: 02/16/2017 07:27   Dg Abd 1 View  Result Date:  02/13/2017 CLINICAL DATA:  No bowel movement for several days. EXAM: ABDOMEN - 1 VIEW COMPARISON:  None. FINDINGS: Large amount of stool seen throughout the colon. No abnormal bowel dilatation is noted. Phleboliths are noted in the pelvis. IMPRESSION: Large stool burden is noted. Electronically Signed   By: Marijo Conception, M.D.   On: 02/13/2017 13:13   Ct Head Wo Contrast  Result Date: 03/05/2017 CLINICAL DATA:  Fall 2 days ago. EXAM: CT HEAD WITHOUT CONTRAST TECHNIQUE: Contiguous axial images were obtained from the base of the skull through the vertex without intravenous contrast. COMPARISON:  02/06/2017 FINDINGS: Brain: There is atrophy and chronic small vessel disease changes. No acute intracranial abnormality. Specifically, no hemorrhage, hydrocephalus, mass lesion, acute infarction, or significant intracranial injury. Vascular: No hyperdense vessel or unexpected calcification. Skull: No acute calvarial abnormality. Sinuses/Orbits: Small air-fluid level in the right maxillary sinus. Remainder the paranasal sinuses are clear. Mastoid air cells are clear. Orbital soft tissues unremarkable. Other: None IMPRESSION: No acute intracranial abnormality. Atrophy, chronic microvascular disease. Small air-fluid level in the right maxillary sinus may reflect acute sinusitis. Electronically Signed   By: Rolm Baptise M.D.   On: 03/05/2017 08:21   Ct Head Wo Contrast  Result Date: 02/06/2017 CLINICAL DATA:  Fall, on Coumadin, forehead abrasion, initial encounter. EXAM: CT HEAD WITHOUT CONTRAST CT CERVICAL SPINE WITHOUT CONTRAST TECHNIQUE: Multidetector CT imaging of the head and cervical spine was performed following the standard protocol without intravenous contrast. Multiplanar CT image reconstructions of the cervical spine were also generated. COMPARISON:  07/31/2016. FINDINGS: CT HEAD FINDINGS Brain: No evidence of an acute infarct, acute hemorrhage, mass lesion, mass effect or hydrocephalus. Atrophy and confluent  periventricular low attenuation. Ventricular prominence is unchanged and in proportion to the degree of atrophy. Vascular: No hyperdense vessel or unexpected calcification. Skull: Normal. Negative for fracture or focal lesion. Sinuses/Orbits: No acute finding. Other: None. CT  CERVICAL SPINE FINDINGS Alignment: Image quality is degraded by motion. Reversal of the normal cervical lordosis, centered at C6. Skull base and vertebrae: Image quality is degraded by motion. No definite fracture. Soft tissues and spinal canal: Image quality is degraded by motion. No definite acute finding. Disc levels: Image quality is degraded by motion. Multilevel facet hypertrophy. Endplate degenerative changes are seen anteriorly from C4-5 to C7-T1. Upper chest: Image quality is degraded by motion. Biapical pleuroparenchymal scarring. Other: None. IMPRESSION: 1. No acute intracranial abnormality. 2. Cervical spine images are degraded by motion. No definite fracture or subluxation. 3. Brain atrophy and chronic microvascular white matter ischemic changes. 4. Reversal of the normal cervical lordosis with degenerative disc disease. Electronically Signed   By: Lorin Picket M.D.   On: 02/06/2017 07:54   Ct Cervical Spine Wo Contrast  Result Date: 02/06/2017 CLINICAL DATA:  Fall, on Coumadin, forehead abrasion, initial encounter. EXAM: CT HEAD WITHOUT CONTRAST CT CERVICAL SPINE WITHOUT CONTRAST TECHNIQUE: Multidetector CT imaging of the head and cervical spine was performed following the standard protocol without intravenous contrast. Multiplanar CT image reconstructions of the cervical spine were also generated. COMPARISON:  07/31/2016. FINDINGS: CT HEAD FINDINGS Brain: No evidence of an acute infarct, acute hemorrhage, mass lesion, mass effect or hydrocephalus. Atrophy and confluent periventricular low attenuation. Ventricular prominence is unchanged and in proportion to the degree of atrophy. Vascular: No hyperdense vessel or  unexpected calcification. Skull: Normal. Negative for fracture or focal lesion. Sinuses/Orbits: No acute finding. Other: None. CT CERVICAL SPINE FINDINGS Alignment: Image quality is degraded by motion. Reversal of the normal cervical lordosis, centered at C6. Skull base and vertebrae: Image quality is degraded by motion. No definite fracture. Soft tissues and spinal canal: Image quality is degraded by motion. No definite acute finding. Disc levels: Image quality is degraded by motion. Multilevel facet hypertrophy. Endplate degenerative changes are seen anteriorly from C4-5 to C7-T1. Upper chest: Image quality is degraded by motion. Biapical pleuroparenchymal scarring. Other: None. IMPRESSION: 1. No acute intracranial abnormality. 2. Cervical spine images are degraded by motion. No definite fracture or subluxation. 3. Brain atrophy and chronic microvascular white matter ischemic changes. 4. Reversal of the normal cervical lordosis with degenerative disc disease. Electronically Signed   By: Lorin Picket M.D.   On: 02/06/2017 07:54   Dg Chest Port 1 View  Result Date: 02/12/2017 CLINICAL DATA:  Shortness of breath. History of COPD, atrial fibrillation, coronary artery disease and CABG, former smoker. EXAM: PORTABLE CHEST 1 VIEW COMPARISON:  Chest x-ray of February 08, 2017 FINDINGS: The lungs are hyperinflated with hemidiaphragm flattening. There is hazy increased density at the left lung base today. There is a small left pleural effusion. The right lung is well-expanded and clear. The heart is top-normal in size. The pulmonary vascularity is normal. There is calcification in the wall of the aortic arch. The sternal wires are intact. IMPRESSION: COPD. Hazy increased density the left lung base more conspicuous today. This suggests subsegmental atelectasis or early pneumonia. There may be a trace of pleural fluid at the left lung base as well. When the patient can tolerate the procedure, a PA and lateral chest  x-ray would be useful. Electronically Signed   By: David  Martinique M.D.   On: 02/12/2017 11:06   Dg Chest Port 1 View  Result Date: 02/06/2017 CLINICAL DATA:  Shortness of breath with fall EXAM: PORTABLE CHEST 1 VIEW COMPARISON:  January 03, 2017 FINDINGS: There is no edema or consolidation. Lungs are mildly hyperexpanded.  Heart is upper normal in size with pulmonary vascularity within normal limits. Patient is status post internal mammary bypass grafting. There is aortic atherosclerosis. No adenopathy. No evident bone lesions. No pneumothorax. IMPRESSION: Aortic atherosclerosis. No edema or consolidation. Stable cardiac silhouette. Aortic Atherosclerosis (ICD10-I70.0). Electronically Signed   By: Lowella Grip III M.D.   On: 02/06/2017 08:00   Dg Swallowing Func-speech Pathology  Result Date: 02/17/2017 Objective Swallowing Evaluation: Type of Study: MBS-Modified Barium Swallow Study  Patient Details Name: LYNDELL GILLYARD MRN: [id number] Date of Birth: 1934/03/07 Today's Date: 02/17/2017 Time: SLP Start Time (ACUTE ONLY): 1506 -SLP Stop Time (ACUTE ONLY): 1521 SLP Time Calculation (min) (ACUTE ONLY): 15 min Past Medical History: Past Medical History: Diagnosis Date . Abnormality of gait 08/02/2014 . Cerebrovascular disease 05/21/2016 . Cervical spinal stenosis  . Chronic atrial fibrillation (HCC)   a. chronic/rate controlled;  b. chronic coumadin. . Chronic diastolic CHF (congestive heart failure) ([place])   a. 02/2013 Echo: EF 55-60%, mild AI, mod dil LA. . CKD (chronic kidney disease), stage III ([place])  . COPD with emphysema ([place])   PFT 05/02/10>>FEV1 1.35(62%), FEV1% 66, DLCO 75% . Coronary artery disease   a. s/p CABG;  b. Abnormal nuc 2015 - managed medically. . CVA (cerebral vascular accident) ([place])   left sided weakness . Dementia  . Depression  . Fibromyalgia  . GERD (gastroesophageal reflux disease)   pepcid 2-3 times per week . Gout  . Hemiparesis and alteration of sensations as late effects of stroke  ([place]) 01/17/2015 . History of melanoma   squamous cell, melanoma . Hyperlipidemia   a. statin intolerant, not felt to be candidate for PCSK9 due to chronically elevated CK levels. . Hypertension  . Hypertensive heart disease  . Hypothyroidism  . Insomnia  . Intermittent confusion  . Memory change 01/23/2014 . Nasal polyposis  . Osteoarthritis  . Osteoporosis  . Pneumonia   1990 . Tobacco abuse  Past Surgical History: Past Surgical History: Procedure Laterality Date . ABDOMINAL HYSTERECTOMY   . BREAST LUMPECTOMY  1980s  Benign lesion - right . carpel tunnel    right . CORONARY ARTERY BYPASS GRAFT  2000 . EYE SURGERY    bilateral cataracts . IR GENERIC HISTORICAL  03/31/2016  IR RADIOLOGIST EVAL & MGMT 03/31/2016 [facility]INTERV RAD . RADIOLOGY WITH ANESTHESIA  12/24/2011  Procedure: RADIOLOGY WITH ANESTHESIA;  Surgeon: Medication Radiologist, MD;  Location: [place];  Service: Radiology;  Laterality: N/A;  Extra Cranial Vascular Stent . RADIOLOGY WITH ANESTHESIA N/A 07/19/2014  Procedure: ANGIOPLASTY;  Surgeon: Luanne Bras, MD;  Location: [place];  Service: Radiology;  Laterality: N/A; . RADIOLOGY WITH ANESTHESIA N/A 07/27/2014  Procedure: ANGIOPLASTY;  Surgeon: Luanne Bras, MD;  Location: [place];  Service: Radiology;  Laterality: N/A; . TONSILLECTOMY   HPI: pt is an 83 yo female adm to [facility] with respiratory difficulties and weakness, s/p fall.  Pt found to have rhinovirus and now with concerns for RUL and left lobe pna.  PMH + for h/o smoking from age [age], dementia, cva in 2001 with left sided weakness, cervical spinal stenosis, Afib, COPD, bronchitis.  Pt also was around recent sick contact prior to admission.  Pt denies dysphagia as does her daughter.  Pt constantly wiping her nose during session.  Neck imaging showed Endplate degenerative changes are seen anteriorly from C4-C5, C7-T1.   Subjective: pt awake in chair Assessment / Plan / Recommendation CHL IP CLINICAL IMPRESSIONS 02/17/2017 Clinical Impression Pt presents  with functional oropharyngeal swallow without aspiration  or penetration of any consistency tested.  Pt's swallow is timely and strong without significant residuals. She again appears with prominent cricopharyngeus as noted on prior MBS but this did not impair barium flow. Pt able to readily swallow tablet with thin liquid without difficulty.  Recommend continue regular/thin diet with general precautions. Daughter and pt educated to findings/recommendations.  SLP Visit Diagnosis Dysphagia, unspecified (R13.10) Attention and concentration deficit following -- Frontal lobe and executive function deficit following -- Impact on safety and function Mild aspiration risk   CHL IP TREATMENT RECOMMENDATION 02/13/2017 Treatment Recommendations Therapy as outlined in treatment plan below   Prognosis 02/13/2017 Prognosis for Safe Diet Advancement Fair Barriers to Reach Goals Behavior Barriers/Prognosis Comment -- CHL IP DIET RECOMMENDATION 02/17/2017 SLP Diet Recommendations Regular solids;Thin liquid Liquid Administration via Cup;Straw Medication Administration Whole meds with liquid Compensations Minimize environmental distractions;Slow rate;Small sips/bites Postural Changes Remain semi-upright after after feeds/meals (Comment)   CHL IP OTHER RECOMMENDATIONS 02/17/2017 Recommended Consults -- Oral Care Recommendations Oral care BID Other Recommendations --   CHL IP FOLLOW UP RECOMMENDATIONS 02/17/2017 Follow up Recommendations None   CHL IP FREQUENCY AND DURATION 02/13/2017 Speech Therapy Frequency (ACUTE ONLY) min 1 x/week Treatment Duration 1 week      CHL IP ORAL PHASE 02/17/2017 Oral Phase WFL Oral - Pudding Teaspoon -- Oral - Pudding Cup -- Oral - Honey Teaspoon -- Oral - Honey Cup -- Oral - Nectar Teaspoon -- Oral - Nectar Cup WFL Oral - Nectar Straw -- Oral - Thin Teaspoon -- Oral - Thin Cup WFL Oral - Thin Straw WFL Oral - Puree WFL Oral - Mech Soft -- Oral - Regular WFL Oral - Multi-Consistency -- Oral - Pill WFL Oral Phase -  Comment --  CHL IP PHARYNGEAL PHASE 02/17/2017 Pharyngeal Phase WFL Pharyngeal- Pudding Teaspoon -- Pharyngeal -- Pharyngeal- Pudding Cup -- Pharyngeal -- Pharyngeal- Honey Teaspoon -- Pharyngeal -- Pharyngeal- Honey Cup -- Pharyngeal -- Pharyngeal- Nectar Teaspoon -- Pharyngeal -- Pharyngeal- Nectar Cup WFL Pharyngeal -- Pharyngeal- Nectar Straw -- Pharyngeal -- Pharyngeal- Thin Teaspoon -- Pharyngeal -- Pharyngeal- Thin Cup WFL Pharyngeal -- Pharyngeal- Thin Straw WFL Pharyngeal -- Pharyngeal- Puree WFL Pharyngeal -- Pharyngeal- Mechanical Soft -- Pharyngeal -- Pharyngeal- Regular WFL Pharyngeal -- Pharyngeal- Multi-consistency -- Pharyngeal -- Pharyngeal- Pill WFL Pharyngeal -- Pharyngeal Comment --  CHL IP CERVICAL ESOPHAGEAL PHASE 02/17/2017 Cervical Esophageal Phase Impaired Pudding Teaspoon -- Pudding Cup -- Honey Teaspoon -- Honey Cup -- Nectar Teaspoon -- Nectar Cup Prominent cricopharyngeal segment Nectar Straw -- Thin Teaspoon -- Thin Cup Prominent cricopharyngeal segment Thin Straw Prominent cricopharyngeal segment Puree Prominent cricopharyngeal segment Mechanical Soft -- Regular Prominent cricopharyngeal segment Multi-consistency -- Pill Prominent cricopharyngeal segment Cervical Esophageal Comment prominent cricopharyngeus did NOT impair barium flow, at end of test - upon esophageal sweep - suspect very mildly slow clearance of thin with trace backflow - radiologist not present to confirm No flowsheet data found. [name] 02/17/2017, 3:48 PM  Luanna Salk, [facility] SLP [phone]              Microbiology: Recent Results (from the past 240 hour(s))  Respiratory Panel by PCR     Status: Abnormal   Collection Time: 03/04/17  7:11 PM  Result Value Ref Range Status   Adenovirus NOT DETECTED NOT DETECTED Final   Coronavirus 229E NOT DETECTED NOT DETECTED Final   Coronavirus HKU1 NOT DETECTED NOT DETECTED Final   Coronavirus NL63 NOT DETECTED NOT DETECTED Final   Coronavirus OC43 NOT DETECTED  NOT DETECTED Final   Metapneumovirus NOT DETECTED NOT DETECTED Final   Rhinovirus / Enterovirus DETECTED (A) NOT DETECTED Final   Influenza A NOT DETECTED NOT DETECTED Final   Influenza B NOT DETECTED NOT DETECTED Final   Parainfluenza Virus 1 NOT DETECTED NOT DETECTED Final   Parainfluenza Virus 2 NOT DETECTED NOT DETECTED Final   Parainfluenza Virus 3 NOT DETECTED NOT DETECTED Final   Parainfluenza Virus 4 NOT DETECTED NOT DETECTED Final   Respiratory Syncytial Virus NOT DETECTED NOT DETECTED Final   Bordetella pertussis NOT DETECTED NOT DETECTED Final   Chlamydophila pneumoniae NOT DETECTED NOT DETECTED Final   Mycoplasma pneumoniae NOT DETECTED NOT DETECTED Final    Comment: Performed at [facility], [place] [address] [place] [address], [place], [place] [address]  Culture, Urine     Status: Abnormal   Collection Time: 03/05/17 11:52 AM  Result Value Ref Range Status   Specimen Description URINE, CLEAN CATCH  Final   Special Requests NONE  Final   Culture 40,000 COLONIES/mL STAPHYLOCOCCUS EPIDERMIDIS (A)  Final   Report Status 03/07/2017 FINAL  Final   Organism ID, Bacteria STAPHYLOCOCCUS EPIDERMIDIS (A)  Final      Susceptibility   Staphylococcus epidermidis - MIC*    CIPROFLOXACIN >=8 RESISTANT Resistant     GENTAMICIN 4 SENSITIVE Sensitive     NITROFURANTOIN <=16 SENSITIVE Sensitive     OXACILLIN >=4 RESISTANT Resistant     TETRACYCLINE >=16 RESISTANT Resistant     VANCOMYCIN 1 SENSITIVE Sensitive     TRIMETH/SULFA 80 RESISTANT Resistant     CLINDAMYCIN >=8 RESISTANT Resistant     RIFAMPIN 1 SENSITIVE Sensitive     Inducible Clindamycin NEGATIVE Sensitive     * 40,000 COLONIES/mL STAPHYLOCOCCUS EPIDERMIDIS     Labs: Basic Metabolic Panel: Recent Labs  Lab 03/04/17 1849 03/05/17 0422 03/06/17 0410 03/07/17 0452  NA 137 138 139 140  K 3.6 3.6 4.0 3.9  CL 104 103 103 106  CO2 25 28 26 25   GLUCOSE 106* 109* 256* 176*  BUN 14 14 20  28*  CREATININE 0.75 0.73 0.99 1.04*  CALCIUM  8.2* 8.1* 7.9* 8.3*  MG  --  2.0  --   --    Liver Function Tests: Recent Labs  Lab 03/04/17 1849  AST 20  ALT 19  ALKPHOS 60  BILITOT 1.4*  PROT 6.2*  ALBUMIN 3.2*   No results for input(s): LIPASE, AMYLASE in the last 168 hours. No results for input(s): AMMONIA in the last 168 hours. CBC: Recent Labs  Lab 03/04/17 1849 03/05/17 0422 03/05/17 1233 03/06/17 0410  WBC 5.7 5.6 5.3 3.7*  NEUTROABS 4.5  --   --   --   HGB 10.4* 9.8* 10.6* 9.6*  HCT 32.3* 29.6* 32.7* 29.8*  MCV 90.5 89.4 90.3 89.2  PLT 162 176 191 186   Cardiac Enzymes: Recent Labs  Lab 03/04/17 1849 03/05/17 0716 03/05/17 1233 03/05/17 1821  TROPONINI 0.05* 0.06* 0.05* 0.04*   BNP: BNP (last 3 results) Recent Labs    02/14/17 0408 02/15/17 0557 03/04/17 1849  BNP 190.5* 184.2* 358.9*    ProBNP (last 3 results) Recent Labs    01/23/17 1156  PROBNP 2,015*    CBG: Recent Labs  Lab 03/06/17 0736 03/06/17 1149 03/06/17 1640 03/06/17 2126 03/07/17 0757  GLUCAP 201* 258* 139* 202* 170*       Signed:  Oswald Hillock MD.  [facility] 03/07/2017, 10:52 AM

## 2017-03-09 ENCOUNTER — Telehealth: Payer: Self-pay | Admitting: Interventional Cardiology

## 2017-03-09 ENCOUNTER — Ambulatory Visit (INDEPENDENT_AMBULATORY_CARE_PROVIDER_SITE_OTHER): Payer: Medicare Other | Admitting: Cardiology

## 2017-03-09 DIAGNOSIS — I482 Chronic atrial fibrillation, unspecified: Secondary | ICD-10-CM

## 2017-03-09 DIAGNOSIS — Z5181 Encounter for therapeutic drug level monitoring: Secondary | ICD-10-CM | POA: Diagnosis not present

## 2017-03-09 LAB — POCT INR: INR: 4.5

## 2017-03-09 NOTE — Progress Notes (Signed)
Cardiology Office Note   Date:  03/10/2017   ID:  Amber, [name] [birth year], MRN [id number]  PCP:  Antony Contras, MD  Cardiologist:  Dr. Tamala Julian    Chief Complaint  Patient presents with  . Hospitalization Follow-up    CHF      History of Present Illness: Amber [name] is a 83 y.o. female who presents for post hospitalization.    She has a hx of CAD s/p CABG in 2000, chronic atrial fibrillation on coumadin,D-CHF,COPD,CKD stage III, HLD (statin intolerant), and HTN with hypertensive heart disease, dementia and cerebrovascular disease was admitted 01/23 w/ SOB, wt up 7 lbs, COPD exacerbation.  She has had several admits.  This time D/c'd with lasix 40 daily after admit with CHF, diastolic.  3 weeks prior to this  She had been admitted with PNA.   Recent admit with COPD exacerbation.   D/c'd 03/07/17.  Her cozaar decreased to 25 mg daily with hypotension and elevated Cr.  Today she is weaning the steroids, she has completed ABX but also was on Macrobid for UTI.  With steroids and ABX her INR was up to 4.5 and coumadin held yesterday and today.   She has had some difficulty at home with increased wheezes and SOB.  With this her lasix was increased by her daughters which did help and with increased urinary output.  Does have a cough.  Rare chest pain and does not last but a few minutes.   Troponins in the hospital have been 0.04 to 0.06 felt to be demand ischemia from hypoxia and respiratory distress.    She is using her nebulizer.   She has renal insuff and increased lasix recently, and has been anemic.    Past Medical History:  Diagnosis Date  . Abnormality of gait 08/02/2014  . Cerebrovascular disease 05/21/2016  . Cervical spinal stenosis   . Chronic atrial fibrillation (HCC)    a. chronic/rate controlled;  b. chronic coumadin.  . Chronic diastolic CHF (congestive heart failure) ([place])    a. 02/2013 Echo: EF 55-60%, mild AI, mod dil LA.  . CKD (chronic kidney disease), stage III  ([place])   . COPD with emphysema ([place])    PFT 05/02/10>>FEV1 1.35(62%), FEV1% 66, DLCO 75%  . Coronary artery disease    a. s/p CABG;  b. Abnormal nuc 2015 - managed medically.  . CVA (cerebral vascular accident) ([place])    left sided weakness  . Depression   . Fibromyalgia   . GERD (gastroesophageal reflux disease)    pepcid 2-3 times per week  . Gout   . Hemiparesis and alteration of sensations as late effects of stroke ([place]) 01/17/2015  . History of melanoma    squamous cell, melanoma  . Hyperlipidemia    a. statin intolerant, not felt to be candidate for PCSK9 due to chronically elevated CK levels.  . Hypertension   . Hypertensive heart disease   . Hypothyroidism   . Insomnia   . Memory change 01/23/2014  . Nasal polyposis   . Osteoarthritis   . Osteoporosis   . Pneumonia    1990  . Tobacco abuse     Past Surgical History:  Procedure Laterality Date  . ABDOMINAL HYSTERECTOMY    . BREAST LUMPECTOMY  1980s   Benign lesion - right  . carpel tunnel     right  . CORONARY ARTERY BYPASS GRAFT  2000  . EYE SURGERY     bilateral cataracts  . IR GENERIC  HISTORICAL  03/31/2016   IR RADIOLOGIST EVAL & MGMT 03/31/2016 [facility]-INTERV RAD  . RADIOLOGY WITH ANESTHESIA  12/24/2011   Procedure: RADIOLOGY WITH ANESTHESIA;  Surgeon: Medication Radiologist, MD;  Location: [place];  Service: Radiology;  Laterality: N/A;  Extra Cranial Vascular Stent  . RADIOLOGY WITH ANESTHESIA N/A 07/19/2014   Procedure: ANGIOPLASTY;  Surgeon: Luanne Bras, MD;  Location: [place];  Service: Radiology;  Laterality: N/A;  . RADIOLOGY WITH ANESTHESIA N/A 07/27/2014   Procedure: ANGIOPLASTY;  Surgeon: Luanne Bras, MD;  Location: [place];  Service: Radiology;  Laterality: N/A;  . TONSILLECTOMY       Current Outpatient Medications  Medication Sig Dispense Refill  . allopurinol (ZYLOPRIM) 300 MG tablet Take 1 tablet (300 mg total) by mouth every other day. (Patient taking differently: Take 300 mg by mouth daily. )  45 tablet 3  . amitriptyline (ELAVIL) 10 MG tablet TAKE 2 TABLETS BY MOUTH AT BEDTIME (Patient taking differently: TAKE 43m  BY MOUTH AT BEDTIME) 180 tablet 3  . amLODipine (NORVASC) 5 MG tablet Take 1 tablet (5 mg total) by mouth at bedtime. 90 tablet 0  . benzonatate (TESSALON) 100 MG capsule Take 1 capsule (100 mg total) by mouth 3 (three) times daily as needed for cough. 20 capsule 0  . bisacodyl (DULCOLAX) 5 MG EC tablet Take 2 tablets (10 mg total) by mouth daily. 30 tablet 0  . celecoxib (CELEBREX) 200 MG capsule Take 200 mg by mouth every morning.     . citalopram (CELEXA) 20 MG tablet TAKE 1 TABLET BY MOUTH TWICE A DAY 60 tablet 11  . clopidogrel (PLAVIX) 75 MG tablet Take 75 mg by mouth daily.  2  . COUMADIN 5 MG tablet TAKE AS DIRECTED BY COUMADIN CLINIC. Hold coumadin November 24 th and November 25 th.  Recheck INR on Monday. (Patient taking differently: Take 5-7.5 mg by mouth daily. Take 5 mg everyday except 7.5 mg on Tuesday and Saturday) 135 tablet 1  . digoxin (LANOXIN) 0.125 MG tablet Take 1 tablet (0.125 mg total) by mouth on Mondays, Wednesdays, & Fridays 36 tablet 1  . donepezil (ARICEPT) 10 MG tablet Take 1 tablet (10 mg total) by mouth at bedtime. (Patient taking differently: Take 10 mg by mouth daily. ) 30 tablet 5  . ergocalciferol (VITAMIN D2) 50000 UNITS capsule Take 50,000 Units by mouth 2 (two) times a week. Tuesday and Friday    . famotidine (PEPCID) 20 MG tablet Take 1 tablet (20 mg total) by mouth daily.    . fluticasone (FLONASE) 50 MCG/ACT nasal spray Place 2 sprays into both nostrils 2 (two) times daily. 1 g 2  . furosemide (LASIX) 40 MG tablet Take 1 tablet (40 mg total) by mouth daily. 90 tablet 3  . guaiFENesin (MUCINEX) 600 MG 12 hr tablet Take 2 tablets (1,200 mg total) by mouth 2 (two) times daily.    .Marland Kitchenipratropium (ATROVENT) 0.03 % nasal spray Place 2 sprays into the nose 3 (three) times daily. 30 mL 12  . ipratropium-albuterol (DUONEB) 0.5-2.5 (3) MG/3ML  SOLN Take 3 mLs by nebulization 3 (three) times daily. 300 mL 5  . KLOR-CON M20 20 MEQ tablet Take 0.5 tablets (10 mEq total) by mouth daily. 30 tablet 0  . levothyroxine (SYNTHROID, LEVOTHROID) 25 MCG tablet Take 25 mcg by mouth every morning.     .Marland Kitchenlosartan (COZAAR) 25 MG tablet Take 1 tablet (25 mg total) by mouth daily. 90 tablet 3  . memantine (NAMENDA XR) 28  MG CP24 24 hr capsule TAKE ONE CAPSULE BY MOUTH EVERY DAY 90 capsule 3  . metoprolol succinate (TOPROL-XL) 25 MG 24 hr tablet TAKE 1 TABLET (25 MG TOTAL) BY MOUTH DAILY. (Patient taking differently: TAKE 1 TABLET (25 MG TOTAL) BY MOUTH DAILY. Morning) 90 tablet 2  . mometasone-formoterol (DULERA) 200-5 MCG/ACT AERO Inhale 2 puffs into the lungs 2 (two) times daily.    . nitrofurantoin, macrocrystal-monohydrate, (MACROBID) 100 MG capsule Take 1 capsule (100 mg total) by mouth 2 (two) times daily for 7 days. 14 capsule 0  . nitroGLYCERIN (NITROSTAT) 0.4 MG SL tablet Place 1 tablet (0.4 mg total) under the tongue every 5 (five) minutes as needed for chest pain. 25 tablet 3  . polyethylene glycol (MIRALAX / GLYCOLAX) packet Take 17 g by mouth daily. 14 each 0  . predniSONE (DELTASONE) 10 MG tablet Prednisone 40 mg po daily x 4 day then Prednisone 30 mg po daily x 4 day then Prednisone 20 mg po daily x 4 day then Prednisone 10 mg daily x 2 day then stop... 40 tablet 0  . PROAIR HFA 108 (90 BASE) MCG/ACT inhaler INHALE 2 PUFFS INTO LUNGS EVERY 6 HOURS AS NEEDED FOR WHEEZING 8.5 each 0   No current facility-administered medications for this visit.     Allergies:   Ezetimibe; Welchol [colesevelam hcl]; Adhesive [tape]; Ceclor [cefaclor]; Elastic bandages & [zinc]; Latex; Lipitor [atorvastatin calcium]; Mevacor [lovastatin]; Pravachol; and Vasotec    Social History:  The patient  reports that she quit smoking about 40 years ago. Her smoking use included cigarettes. She has a 10.00 pack-year smoking history. she has never used smokeless  tobacco. She reports that she does not drink alcohol or use drugs.   Family History:  The patient's family history includes CAD in her brother and father; Diabetes type II in her brother; Stroke in her mother.    ROS:  General+o colds no fevers, no weight changes Skin:no rashes or ulcers HEENT:no blurred vision, no congestion CV:see HPI PUL:see HPI GI:no diarrhea constipation or melena, no indigestion GU:no hematuria, no dysuria MS:no joint pain, no claudication Neuro:no syncope, no lightheadedness Endo:borderline diabetes with hgb A1c at 6.0,   no thyroid disease  Wt Readings from Last 3 Encounters:  03/10/17 156 lb 1.9 oz (70.8 kg)  03/07/17 160 lb 3.2 oz (72.7 kg)  02/24/17 154 lb (69.9 kg)     PHYSICAL EXAM: VS:  BP 138/78   Pulse 68   Resp 16   Ht 5' 4"  (1.626 m)   Wt 156 lb 1.9 oz (70.8 kg)   SpO2 98%   BMI 26.80 kg/m  , BMI Body mass index is 26.8 kg/m. General:Pleasant affect, NAD Skin:Warm and dry, brisk capillary refill HEENT:normocephalic, sclera clear, mucus membranes moist Neck:supple, + JVD, no bruits  Heart:S1S2 irreg irreg without murmur, gallup, rub or click Lungs: with fine rales and diminished breath sounds, no rhonchi, or wheezes JQB:HALP, non tender, + BS, do not palpate liver spleen or masses Ext:no lower ext edema, 2+ pedal pulses, 2+ radial pulses brace on Rt leg  Neuro:alert and oriented X 3, MAE, follows commands, + facial symmetry    EKG:  EKG is ordered today. The ekg ordered today demonstrates a fib rate controlled, with inf lat ST depression but no changes.     Recent Labs: 01/23/2017: NT-Pro BNP 2,015 03/04/2017: ALT 19; B Natriuretic Peptide 358.9 03/05/2017: Magnesium 2.0; TSH 2.045 03/06/2017: Hemoglobin 9.6; Platelets 186 03/07/2017: BUN 28; Creatinine, Ser 1.04; Potassium  3.9; Sodium 140    Lipid Panel    Component Value Date/Time   CHOL 228 (H) 03/06/2017 0410   TRIG 34 03/06/2017 0410   HDL 62 03/06/2017 0410   CHOLHDL  3.7 03/06/2017 0410   VLDL 7 03/06/2017 0410   LDLCALC 159 (H) 03/06/2017 0410   LDLDIRECT 236.0 08/02/2014 1418       Other studies Reviewed: Additional studies/ records that were reviewed today include: . ECHO  02/09/17 Study Conclusions  - Left ventricle: The cavity size was normal. Systolic function was   normal. The estimated ejection fraction was in the range of 60%   to 65%. Wall motion was normal; there were no regional wall   motion abnormalities. - Aortic valve: There was mild stenosis. Mean gradient (S): 9 mm   Hg. Valve area (VTI): 1.41 cm^2. Valve area (Vmax): 1.38 cm^2.   Valve area (Vmean): 1.25 cm^2. - Aortic root: The aortic root was normal in size. - Mitral valve: The findings are consistent with mild stenosis.   Mean gradient (D): 3 mm Hg. Valve area by pressure half-time:   1.68 cm^2. Valve area by continuity equation (using LVOT flow):   1.79 cm^2. - Left atrium: The atrium was moderately dilated. - Right ventricle: The cavity size was normal. Wall thickness was   normal. Systolic function was normal. - Right atrium: The atrium was normal in size. - Tricuspid valve: There was moderate regurgitation. - Pulmonary arteries: Systolic pressure was moderately increased.   PA peak pressure: 52 mm Hg (S). - Inferior vena cava: The vessel was dilated. The respirophasic   diameter changes were blunted (< 50%), consistent with elevated   central venous pressure. - Pericardium, extracardiac: There was no pericardial effusion.  Echo 01/02/17 Study Conclusions  - Left ventricle: The cavity size was normal. Wall thickness was   normal. Systolic function was normal. The estimated ejection   fraction was in the range of 55% to 60%. - Aortic valve: Valve area (VTI): 1.12 cm^2. Valve area (Vmax):   1.02 cm^2. Valve area (Vmean): 0.96 cm^2. - Mitral valve: Mild functional MS with MAC peak diastolic gradient   only 5 mmHg. Moderately calcified annulus. Severely  thickened,   severely calcified leaflets . There was mild to moderate   regurgitation. Valve area by continuity equation (using LVOT   flow): 1.33 cm^2. - Left atrium: The atrium was moderately dilated. - Atrial septum: No defect or patent foramen ovale was identified. - Pulmonary arteries: PA peak pressure: 49 mm Hg (S).  ASSESSMENT AND PLAN:  1.  Acute on diastolic HF  Still with need for increased dose of lasix.  Will change to 60 mg in AM and if needed for SOB 20 mg in the afternoon -- she has nebs if needed as well.  Will check BMP today.  Will also check 2 V CXR today.  Follow up   2.  Anemia - will check CBC today  3.  CAD with hx CABG minimal chest pain.    4.  A fib permanent rate controlled.  Continue meds  5.  Hyper anitcoagulated at 4.5 yesterday holding coumadin yesterday and today. Has been on ABX and steroids will recheck today  6.  Pulmonary HTN, with previous dose of cozaar at 50 but with diuresis in hospital and lower BP was reduced to 25 mg.  Will check with Dr. Tamala Julian, depending on Cr will most likely increase, Her BP will allow.   7.  Recent respiratory distress with  COPD exacerbation. PNA virus.  To see Pulmonary in Feb.         Current medicines are reviewed with the patient today.  The patient Has no concerns regarding medicines.  The following changes have been made:  See above Labs/ tests ordered today include:see above  Disposition:   FU:  see above  Signed, Cecilie Kicks, NP  03/10/2017 8:37 AM    [facility] [place], [place], [place], [place] Phone: [phone]; Fax: [phone]

## 2017-03-09 NOTE — Patient Instructions (Signed)
Description   Spoke [place] RN instructed to have pt hold today's dose and hold tomorrow's dose then continue taking 1 tablet daily except 1.5 tablets on Tuesdays and Saturdays. Continue dark leafy greens servings 2 times a week.  Recheck INR on Friday-pt on doxy and prednisone taper.  Call with any questions or problems [phone]

## 2017-03-09 NOTE — Telephone Encounter (Signed)
New message      [facility] calling for home health order to resume care.  Verdis Frederickson [phone]

## 2017-03-09 NOTE — Telephone Encounter (Signed)
Spoke with Verdis Frederickson at [facility] and advised ok to resume [facility] for INR checks and CHF education.  While speaking with Verdis Frederickson she mentioned that one of the pt's daughter's is a Marine scientist and gave the pt two extra tabs of Lasix yesterday d/t edema in thighs.  Verdis Frederickson says pt has no edema in ankles.  She wanted to make [place] aware incase we wanted to give order saying ok for pt to take an extra lasix for swelling.  Advised I would send this message to the NP seeing pt tomorrow so we can decide if this is ok once pt has been seen.  Verdis Frederickson appreciative for call.

## 2017-03-10 ENCOUNTER — Encounter: Payer: Self-pay | Admitting: Cardiology

## 2017-03-10 ENCOUNTER — Ambulatory Visit (INDEPENDENT_AMBULATORY_CARE_PROVIDER_SITE_OTHER)
Admission: RE | Admit: 2017-03-10 | Discharge: 2017-03-10 | Disposition: A | Discharge status: Home / Self Care | Payer: Medicare Other | Source: Ambulatory Visit | Attending: Cardiology | Admitting: Cardiology

## 2017-03-10 ENCOUNTER — Telehealth: Payer: Self-pay | Admitting: Interventional Cardiology

## 2017-03-10 ENCOUNTER — Other Ambulatory Visit: Payer: Self-pay | Admitting: Interventional Cardiology

## 2017-03-10 ENCOUNTER — Ambulatory Visit: Payer: Medicare Other | Admitting: Cardiology

## 2017-03-10 VITALS — BP 138/78 | HR 68 | Resp 16 | Ht 64.0 in | Wt 156.1 lb

## 2017-03-10 DIAGNOSIS — I5033 Acute on chronic diastolic (congestive) heart failure: Secondary | ICD-10-CM

## 2017-03-10 DIAGNOSIS — I482 Chronic atrial fibrillation, unspecified: Secondary | ICD-10-CM

## 2017-03-10 DIAGNOSIS — J690 Pneumonitis due to inhalation of food and vomit: Secondary | ICD-10-CM

## 2017-03-10 DIAGNOSIS — R0602 Shortness of breath: Secondary | ICD-10-CM | POA: Diagnosis not present

## 2017-03-10 DIAGNOSIS — N289 Disorder of kidney and ureter, unspecified: Secondary | ICD-10-CM

## 2017-03-10 DIAGNOSIS — I272 Pulmonary hypertension, unspecified: Secondary | ICD-10-CM

## 2017-03-10 NOTE — Telephone Encounter (Signed)
New Message   Patient daughter is calling to get lab results. Please call to discuss.

## 2017-03-10 NOTE — Patient Instructions (Addendum)
Medication Instructions:   Lasix 60mg  in morning and 20mg  as needed in afternoon if gains 3 pounds in a day or 5 pounds in a week.   -- If you need a refill on your cardiac medications before your next appointment, please call your pharmacy. --  Labwork:  INR, BMP, BNP, CBC  Testing/Procedures:  Chest Xray at [facility] [place]: As scheduled    Thank you for choosing [facility]!!    Any Other Special Instructions Will Be Listed Below (If Applicable).

## 2017-03-10 NOTE — Telephone Encounter (Signed)
CBC, BNP pending. Chest X-ray and BMP not reviewed by provider yet. INR is 3.0.  I reviewed with Fuller [place], PharmD and pt should skip dose today and resume dosing tomorrow as outlined yesterday. Home health to check INR on Friday.  I spoke with pt's daughter and gave her information from pharmacist. I told pt's daughter we would call her once lab work and chest X-ray have been reviewed.

## 2017-03-12 ENCOUNTER — Ambulatory Visit: Payer: Medicare Other | Admitting: Interventional Cardiology

## 2017-03-12 LAB — PROTIME-INR
INR: 3 — AB (ref 0.8–1.2)
PROTHROMBIN TIME: 29 s — AB (ref 9.1–12.0)

## 2017-03-12 LAB — CBC WITH DIFFERENTIAL/PLATELET
BASOS ABS: 0 10*3/uL (ref 0.0–0.2)
Basos: 0 %
EOS (ABSOLUTE): 0 10*3/uL (ref 0.0–0.4)
Eos: 0 %
Hematocrit: 34.3 % (ref 34.0–46.6)
Hemoglobin: 11.8 g/dL (ref 11.1–15.9)
LYMPHS ABS: 2.6 10*3/uL (ref 0.7–3.1)
Lymphs: 27 %
MCH: 29.2 pg (ref 26.6–33.0)
MCHC: 34.4 g/dL (ref 31.5–35.7)
MCV: 85 fL (ref 79–97)
MONOS ABS: 1.1 10*3/uL — AB (ref 0.1–0.9)
Monocytes: 12 %
NEUTROS PCT: 50 %
Neutrophils Absolute: 4.8 10*3/uL (ref 1.4–7.0)
PLATELETS: 422 10*3/uL — AB (ref 150–379)
RBC: 4.04 x10E6/uL (ref 3.77–5.28)
RDW: 16.2 % — AB (ref 12.3–15.4)
WBC: 9.5 10*3/uL (ref 3.4–10.8)

## 2017-03-12 LAB — BASIC METABOLIC PANEL
BUN/Creatinine Ratio: 27 (ref 12–28)
BUN: 24 mg/dL (ref 8–27)
CALCIUM: 9.5 mg/dL (ref 8.7–10.3)
CHLORIDE: 97 mmol/L (ref 96–106)
CO2: 26 mmol/L (ref 20–29)
Creatinine, Ser: 0.9 mg/dL (ref 0.57–1.00)
GFR calc Af Amer: 70 mL/min/{1.73_m2} (ref >59)
GFR, EST NON AFRICAN AMERICAN: 61 mL/min/{1.73_m2} (ref >59)
Glucose: 90 mg/dL (ref 65–99)
POTASSIUM: 3.8 mmol/L (ref 3.5–5.2)
Sodium: 141 mmol/L (ref 134–144)

## 2017-03-12 LAB — IMMATURE CELLS
MYELOCYTES: 1 % — ABNORMAL HIGH (ref 0–0)
Metamyelocytes: 10 % — ABNORMAL HIGH (ref 0–0)

## 2017-03-12 LAB — BRAIN NATRIURETIC PEPTIDE: BNP: 253.4 pg/mL — ABNORMAL HIGH (ref 0.0–100.0)

## 2017-03-13 ENCOUNTER — Ambulatory Visit (INDEPENDENT_AMBULATORY_CARE_PROVIDER_SITE_OTHER): Payer: Medicare Other | Admitting: Cardiology

## 2017-03-13 DIAGNOSIS — Z5181 Encounter for therapeutic drug level monitoring: Secondary | ICD-10-CM

## 2017-03-13 LAB — POCT INR: INR: 1.5

## 2017-03-13 NOTE — Patient Instructions (Signed)
Description   Spoke with [facility] RN instructed to have pt take 1.5 tablets today, then continue taking 1 tablet daily except 1.5 tablets on Tuesdays and Saturdays. Continue dark leafy greens servings 2 times a week.  Recheck INR next Wednesday - pt on doxy and prednisone taper.  Call with any questions or problems [phone]

## 2017-03-14 NOTE — Progress Notes (Signed)
As long as BP okay, increasing Losartan not that important in DHF. If BP a concern, would be okay but need to follow kidney function.

## 2017-03-17 ENCOUNTER — Telehealth: Payer: Self-pay | Admitting: *Deleted

## 2017-03-17 NOTE — Telephone Encounter (Signed)
Placed call to daughter on DPR, [name], and she has been advised that we plan to leave pts Losartan at 25 mg daily unless her bp remains high, that we don't want to stress her kidneys out.  [name] thanked me for the call and appreciated [place] following up on this.

## 2017-03-17 NOTE — Telephone Encounter (Signed)
-----   Message from Isaiah Serge, NP sent at 03/16/2017  8:50 AM EST ----- Please let family know will keep losartan where it is unless her BP remains high.  We don't want to stress her kidneys.  Thanks.  Discussed with Dr. Tamala Julian.  ----- Message ----- From: Belva Crome, MD Sent: 03/14/2017  10:47 AM To: Isaiah Serge, NP    ----- Message ----- From: Isaiah Serge, NP Sent: 03/12/2017   1:52 PM To: Belva Crome, MD  I saw Ms. Nahm, she had another hospitalization for HF. Her ARB- losartan was decreased to 25 mg, but now with stable Cr and her pulmonary Htn I was going to increase back to 50 mg, if you agree. Thanks.

## 2017-03-18 ENCOUNTER — Ambulatory Visit (INDEPENDENT_AMBULATORY_CARE_PROVIDER_SITE_OTHER): Payer: Medicare Other

## 2017-03-18 DIAGNOSIS — I4891 Unspecified atrial fibrillation: Secondary | ICD-10-CM

## 2017-03-18 DIAGNOSIS — Z5181 Encounter for therapeutic drug level monitoring: Secondary | ICD-10-CM

## 2017-03-18 LAB — POCT INR: INR: 2.5

## 2017-03-18 NOTE — Patient Instructions (Signed)
Description   Spoke with Kathlee Nations [facility] RN instructed to have pt continue taking 1 tablet daily except 1.5 tablets on Tuesdays and Saturdays. Continue dark leafy greens servings 2 times a week.  Recheck INR in 1 week.  Call with any questions or problems [phone]

## 2017-03-19 ENCOUNTER — Ambulatory Visit (INDEPENDENT_AMBULATORY_CARE_PROVIDER_SITE_OTHER)
Admission: RE | Admit: 2017-03-19 | Discharge: 2017-03-19 | Disposition: A | Discharge status: Home / Self Care | Payer: Medicare Other | Source: Ambulatory Visit | Attending: Adult Health | Admitting: Adult Health

## 2017-03-19 ENCOUNTER — Ambulatory Visit: Payer: Medicare Other | Admitting: Adult Health

## 2017-03-19 ENCOUNTER — Encounter: Payer: Self-pay | Admitting: Adult Health

## 2017-03-19 VITALS — BP 122/60 | HR 63 | Ht 62.0 in | Wt 152.4 lb

## 2017-03-19 DIAGNOSIS — J810 Acute pulmonary edema: Secondary | ICD-10-CM

## 2017-03-19 DIAGNOSIS — J441 Chronic obstructive pulmonary disease with (acute) exacerbation: Secondary | ICD-10-CM | POA: Diagnosis not present

## 2017-03-19 DIAGNOSIS — J154 Pneumonia due to other streptococci: Secondary | ICD-10-CM

## 2017-03-19 DIAGNOSIS — I5033 Acute on chronic diastolic (congestive) heart failure: Secondary | ICD-10-CM | POA: Diagnosis not present

## 2017-03-19 MED ORDER — FLUTTER DEVI: 0 refills | Status: DC

## 2017-03-19 NOTE — Patient Instructions (Signed)
Continue on Dulera 2 puffs Twice daily  , rinse after use .  May use Duoneb every 6hr As needed  Wheezing /shortness of breath  Chest xray today .  Continue on Lasix  Follow up with Dr. Lake Bells in 4-6 weeks with PFT and As needed   Please contact office for sooner follow up if symptoms do not improve or worsen or seek emergency care

## 2017-03-19 NOTE — Assessment & Plan Note (Signed)
Recent admission with volume overload.  Patient is improved after diuresis.  She will continue on Lasix 40 mg daily.  Low-salt diet.  Encouraged to weigh daily.

## 2017-03-19 NOTE — Assessment & Plan Note (Signed)
Recurrent exacerbations with associated rhinovirus and enterovirus.  Patient seems to be clinically improving.  She will continue on her current regimen follow-up in 4-6 weeks with a pulmonary function test.  Plan  . Patient Instructions  Continue on Dulera 2 puffs Twice daily  , rinse after use .  May use Duoneb every 6hr As needed  Wheezing /shortness of breath  Chest xray today .  Continue on Lasix  Follow up with Dr. Lake Bells in 4-6 weeks with PFT and As needed   Please contact office for sooner follow up if symptoms do not improve or worsen or seek emergency care

## 2017-03-19 NOTE — Progress Notes (Signed)
@Patient  ID: Amber [name], female    DOB: 08-01-1934, 83 y.o.   MRN: [id number]  Chief Complaint  Patient presents with  . Follow-up    COPD     Referring provider: Antony Contras, MD  HPI: 83 yo female seen for pulmonary consult 02/2017 for Moderate COPD  Previous Dr. Halford Chessman  Patient in 2012  TEST  PFT 05/02/10>>FEV1 1.35(62%), FEV1% 66, DLCO 75%   03/19/2017 Follow up : COPD  Patient returns for a post hospital follow-up.  Patient has been admitted twice over the last month.  She was initially admitted for a COPD exacerbation with positive rhinovirus.  She was treated with antibiotics and nebulized bronchodilators.  Patient has some slow improvement but symptoms returned with increased cough and congestion.  She was seen in urgent care and given antibiotics.  Symptoms continue to worse.  She was admitted to the hospital on January 23.  Found to have acute on chronic diastolic heart failure.  She was treated with IV diuresis.  And discharged on Lasix 40 mg.  Viral panel tested positive for rhinovirus and enterovirus.  She was treated with IV steroids nebulized bronchodilators and doxycycline was extended.  Patient was discharged on a prednisone taper.  Patient says she is starting to feel better she is remains weak.  She denies any hemoptysis chest pain orthopnea PND or increased leg swelling.  Daughter says that she still has some lingering wheezing when she got home.  She gave her an extra Lasix 20 mg for about 2-3 days and this seemed to help quite a bit.  Weights have been stable.    Allergies  Allergen Reactions  . Ezetimibe Other (See Comments)    Myalgia  . Welchol [Colesevelam Hcl] Other (See Comments)    Muscle aches  . Adhesive [Tape] Itching, Rash and Other (See Comments)    Burning   . Ceclor [Cefaclor] Other (See Comments)    Unknown allergic reaction  . Elastic Bandages & [Zinc] Rash and Other (See Comments)    Turns red on the areas it touches  . Latex Itching, Rash  and Other (See Comments)    Burning   . Lipitor [Atorvastatin Calcium] Other (See Comments)    Increased fibromyalgia pain  . Mevacor [Lovastatin] Other (See Comments)    Increased fibromyalgia pain  . Pravachol Other (See Comments)    Increased fibromyalgia pain  . Vasotec Other (See Comments)    Unknown allergic reaction    Immunization History  Administered Date(s) Administered  . Influenza Split 12/12/2010  . Influenza, High Dose Seasonal PF 10/25/2016  . Tdap 02/06/2017    Past Medical History:  Diagnosis Date  . Abnormality of gait 08/02/2014  . Cerebrovascular disease 05/21/2016  . Cervical spinal stenosis   . Chronic atrial fibrillation (HCC)    a. chronic/rate controlled;  b. chronic coumadin.  . Chronic diastolic CHF (congestive heart failure) ([place])    a. 02/2013 Echo: EF 55-60%, mild AI, mod dil LA.  . CKD (chronic kidney disease), stage III ([place])   . COPD with emphysema ([place])    PFT 05/02/10>>FEV1 1.35(62%), FEV1% 66, DLCO 75%  . Coronary artery disease    a. s/p CABG;  b. Abnormal nuc 2015 - managed medically.  . CVA (cerebral vascular accident) ([place])    left sided weakness  . Depression   . Fibromyalgia   . GERD (gastroesophageal reflux disease)    pepcid 2-3 times per week  . Gout   . Hemiparesis and  alteration of sensations as late effects of stroke ([place]) 01/17/2015  . History of melanoma    squamous cell, melanoma  . Hyperlipidemia    a. statin intolerant, not felt to be candidate for PCSK9 due to chronically elevated CK levels.  . Hypertension   . Hypertensive heart disease   . Hypothyroidism   . Insomnia   . Memory change 01/23/2014  . Nasal polyposis   . Osteoarthritis   . Osteoporosis   . Pneumonia    1990  . Tobacco abuse     Tobacco History: Social History   Tobacco Use  Smoking Status Former Smoker  . Packs/day: 0.40  . Years: 25.00  . Pack years: 10.00  . Types: Cigarettes  . Last attempt to quit: 02/10/1977  . Years since  quitting: 40.1  Smokeless Tobacco Never Used   Counseling given: Not Answered   Outpatient Encounter Medications as of 03/19/2017  Medication Sig  . allopurinol (ZYLOPRIM) 300 MG tablet Take 1 tablet (300 mg total) by mouth every other day. (Patient taking differently: Take 300 mg by mouth daily. )  . amitriptyline (ELAVIL) 10 MG tablet TAKE 2 TABLETS BY MOUTH AT BEDTIME (Patient taking differently: TAKE 20mg   BY MOUTH AT BEDTIME)  . amLODipine (NORVASC) 5 MG tablet Take 1 tablet (5 mg total) by mouth at bedtime.  . benzonatate (TESSALON) 100 MG capsule Take 1 capsule (100 mg total) by mouth 3 (three) times daily as needed for cough.  . bisacodyl (DULCOLAX) 5 MG EC tablet Take 2 tablets (10 mg total) by mouth daily.  . celecoxib (CELEBREX) 200 MG capsule Take 200 mg by mouth every morning.   . citalopram (CELEXA) 20 MG tablet TAKE 1 TABLET BY MOUTH TWICE A DAY  . clopidogrel (PLAVIX) 75 MG tablet Take 75 mg by mouth daily.  [name] COUMADIN 5 MG tablet TAKE AS DIRECTED BY COUMADIN CLINIC  . digoxin (LANOXIN) 0.125 MG tablet Take 1 tablet (0.125 mg total) by mouth on Mondays, Wednesdays, & Fridays  . donepezil (ARICEPT) 10 MG tablet Take 1 tablet (10 mg total) by mouth at bedtime. (Patient taking differently: Take 10 mg by mouth daily. )  . ergocalciferol (VITAMIN D2) 50000 UNITS capsule Take 50,000 Units by mouth 2 (two) times a week. Tuesday and Friday  . famotidine (PEPCID) 20 MG tablet Take 1 tablet (20 mg total) by mouth daily.  . fluticasone (FLONASE) 50 MCG/ACT nasal spray Place 2 sprays into both nostrils 2 (two) times daily.  . furosemide (LASIX) 40 MG tablet Take 1 tablet (40 mg total) by mouth daily.  [name] guaiFENesin (MUCINEX) 600 MG 12 hr tablet Take 2 tablets (1,200 mg total) by mouth 2 (two) times daily.  [name] ipratropium (ATROVENT) 0.03 % nasal spray Place 2 sprays into the nose 3 (three) times daily.  [name] ipratropium-albuterol (DUONEB) 0.5-2.5 (3) MG/3ML SOLN Take 3 mLs by nebulization every  6 (six) hours as needed.  [name] KLOR-CON M20 20 MEQ tablet Take 0.5 tablets (10 mEq total) by mouth daily.  [name] levothyroxine (SYNTHROID, LEVOTHROID) 25 MCG tablet Take 25 mcg by mouth every morning.   [name] losartan (COZAAR) 25 MG tablet Take 1 tablet (25 mg total) by mouth daily.  . memantine (NAMENDA XR) 28 MG CP24 24 hr capsule TAKE ONE CAPSULE BY MOUTH EVERY DAY  . metoprolol succinate (TOPROL-XL) 25 MG 24 hr tablet TAKE 1 TABLET (25 MG TOTAL) BY MOUTH DAILY. (Patient taking differently: TAKE 1 TABLET (25 MG TOTAL) BY MOUTH DAILY. Morning)  .  mometasone-formoterol (DULERA) 200-5 MCG/ACT AERO Inhale 2 puffs into the lungs 2 (two) times daily.  . nitroGLYCERIN (NITROSTAT) 0.4 MG SL tablet Place 1 tablet (0.4 mg total) under the tongue every 5 (five) minutes as needed for chest pain.  . polyethylene glycol (MIRALAX / GLYCOLAX) packet Take 17 g by mouth daily.  . predniSONE (DELTASONE) 10 MG tablet Prednisone 40 mg po daily x 4 day then Prednisone 30 mg po daily x 4 day then Prednisone 20 mg po daily x 4 day then Prednisone 10 mg daily x 2 day then stop...  . PROAIR HFA 108 (90 BASE) MCG/ACT inhaler INHALE 2 PUFFS INTO LUNGS EVERY 6 HOURS AS NEEDED FOR WHEEZING  . Respiratory Therapy Supplies (FLUTTER) DEVI Use as directed.  . [DISCONTINUED] ipratropium-albuterol (DUONEB) 0.5-2.5 (3) MG/3ML SOLN Take 3 mLs by nebulization 3 (three) times daily. (Patient not taking: Reported on 03/19/2017)   No facility-administered encounter medications on file as of 03/19/2017.      Review of Systems  Constitutional:   No  weight loss, night sweats,  Fevers, chills, + fatigue, or  lassitude.  HEENT:   No headaches,  Difficulty swallowing,  Tooth/dental problems, or  Sore throat,                No sneezing, itching, ear ache, nasal congestion, post nasal drip,   CV:  No chest pain,  Orthopnea, PND, swelling in lower extremities, anasarca, dizziness, palpitations, syncope.   GI  No heartburn, indigestion, abdominal  pain, nausea, vomiting, diarrhea, change in bowel habits, loss of appetite, bloody stools.   Resp:  .  No chest wall deformity  Skin: no rash or lesions.  GU: no dysuria, change in color of urine, no urgency or frequency.  No flank pain, no hematuria   MS:  No joint pain or swelling.  No decreased range of motion.  No back pain.    Physical Exam  BP 122/60 (BP Location: Left Arm, Cuff Size: Normal)   Pulse 63   Ht 5\' 2"  (1.575 m)   Wt 152 lb 6.4 oz (69.1 kg)   SpO2 98%   BMI 27.87 kg/m   GEN: A/Ox3; pleasant , NAD, elderly   HEENT:  [place]/AT,  EACs-clear, TMs-wnl, NOSE-clear, THROAT-clear, no lesions, no postnasal drip or exudate noted.   NECK:  Supple w/ fair ROM; no JVD; normal carotid impulses w/o bruits; no thyromegaly or nodules palpated; no lymphadenopathy.    RESP  Clear  P & A; w/o, wheezes/ rales/ or rhonchi. no accessory muscle use, no dullness to percussion  CARD:  RRR, no m/r/g, no peripheral edema, pulses intact, no cyanosis or clubbing.  GI:   Soft & nt; nml bowel sounds; no organomegaly or masses detected.   Musco: Warm bil, no deformities or joint swelling noted.   Neuro: alert, no focal deficits noted.    Skin: Warm, no lesions or rashes    Lab Results:  CBC  BMET   BNP Imaging: Dg Chest 2 View  Result Date: 03/19/2017 CLINICAL DATA:  Hypertension EXAM: CHEST  2 VIEW COMPARISON:  March 10, 2017 FINDINGS: There is a persistent small left pleural effusion. Previously noted right pleural effusion has resolved. There is no edema or consolidation. Heart is upper normal in size with pulmonary vascularity within normal limits. Patient is status post internal mammary bypass grafting. There is aortic atherosclerosis. No adenopathy. There is anterior wedging of a midthoracic vertebral body as well as lower thoracic and upper lumbar vertebral body  compression fractures, stable. IMPRESSION: Stable compression fractures in the thoracic and upper lumbar regions.  Small left pleural effusion, stable. No edema or consolidation. Stable cardiac silhouette. There is aortic atherosclerosis. Aortic Atherosclerosis (ICD10-I70.0). Electronically Signed   By: Lowella Grip III M.D.   On: 03/19/2017 12:40   Dg Chest 2 View  Result Date: 03/10/2017 CLINICAL DATA:  Recent hospitalization. Follow-up aspiration pneumonia. Shortness of breath. EXAM: CHEST  2 VIEW COMPARISON:  03/06/2017 and 03/04/2017 radiographs. FINDINGS: The heart size and mediastinal contours are stable status post median sternotomy and CABG. There is aortic atherosclerosis. Pulmonary edema has resolved. There are persistent left-greater-than-right pleural effusions with associated mild bibasilar atelectasis. No focal airspace disease or pneumothorax. At least 3 thoracolumbar compression deformities appear unchanged. IMPRESSION: Resolving congestive heart failure with improving bilateral pleural effusions. No evidence of pneumonia. Electronically Signed   By: Richardean Sale M.D.   On: 03/10/2017 16:13   Dg Chest 2 View  Result Date: 03/06/2017 CLINICAL DATA:  83 year old female with a history shortness of breath EXAM: CHEST  2 VIEW COMPARISON:  03/04/2017, 02/20/2017 FINDINGS: Cardiomediastinal silhouette unchanged in size and contour. Surgical changes of median sternotomy and CABG. Blunting of the left greater than right costophrenic angle. Lateral view demonstrates small meniscus in the costophrenic sulcus. Stigmata of emphysema, with increased retrosternal airspace, flattened hemidiaphragms, increased AP diameter, and hyperinflation on the AP view. Interlobular septal thickening.  No pneumothorax. Degenerative changes of the spine. Unchanged midthoracic compression fracture. IMPRESSION: Persisting small left greater than right pleural effusion with associated atelectasis, and improving pulmonary edema. Surgical changes of median sternotomy and CABG. Electronically Signed   By: Corrie Mckusick D.O.   On:  03/06/2017 15:19   Dg Chest 2 View  Result Date: 03/04/2017 CLINICAL DATA:  Dyspnea and cough starting yesterday EXAM: CHEST  2 VIEW COMPARISON:  02/21/2016 FINDINGS: Status post CABG with borderline cardiomegaly. Aortic atherosclerosis is noted. New bilateral left greater than right pleural effusions with pulmonary vascular redistribution and bilateral airspace opacities are consistent with mild to moderate CHF. Superimposed pneumonia would be difficult to entirely exclude but given the bilaterality and diffuse distribution of the airspace opacities, findings are more in keeping with CHF. No acute osseous abnormality. Degenerative changes are noted along the dorsal spine. IMPRESSION: CHF with bilateral left greater than right pleural effusions. Status post CABG with aortic atherosclerosis. Electronically Signed   By: Ashley Royalty M.D.   On: 03/04/2017 19:14   Dg Chest 2 View  Result Date: 02/20/2017 CLINICAL DATA:  Pneumonia.  Cough. EXAM: CHEST  2 VIEW COMPARISON:  February 15, 2017 FINDINGS: The heart, hila, and mediastinum are stable. A small left effusion or pleural thickening is slightly more prominent. No nodules or masses. No suspicious infiltrates. Anterior wedging of 2 lower thoracic vertebral bodies is stable since February 13, 2017. Anterior wedging of a midthoracic vertebral body is also stable. IMPRESSION: 1. The small left effusion with underlying opacity is mildly more prominent. 2. Anterior wedging of a midthoracic vertebral body into lower thoracic vertebral bodies, unchanged since February 13, 2017. Electronically Signed   By: Dorise Bullion III M.D   On: 02/20/2017 13:25   Ct Head Wo Contrast  Result Date: 03/05/2017 CLINICAL DATA:  Fall 2 days ago. EXAM: CT HEAD WITHOUT CONTRAST TECHNIQUE: Contiguous axial images were obtained from the base of the skull through the vertex without intravenous contrast. COMPARISON:  02/06/2017 FINDINGS: Brain: There is atrophy and chronic small vessel  disease changes. No acute intracranial abnormality.  Specifically, no hemorrhage, hydrocephalus, mass lesion, acute infarction, or significant intracranial injury. Vascular: No hyperdense vessel or unexpected calcification. Skull: No acute calvarial abnormality. Sinuses/Orbits: Small air-fluid level in the right maxillary sinus. Remainder the paranasal sinuses are clear. Mastoid air cells are clear. Orbital soft tissues unremarkable. Other: None IMPRESSION: No acute intracranial abnormality. Atrophy, chronic microvascular disease. Small air-fluid level in the right maxillary sinus may reflect acute sinusitis. Electronically Signed   By: Rolm Baptise M.D.   On: 03/05/2017 08:21     Assessment & Plan:   COPD with acute exacerbation ([place]) Recurrent exacerbations with associated rhinovirus and enterovirus.  Patient seems to be clinically improving.  She will continue on her current regimen follow-up in 4-6 weeks with a pulmonary function test.  Plan  . Patient Instructions  Continue on Dulera 2 puffs Twice daily  , rinse after use .  May use Duoneb every 6hr As needed  Wheezing /shortness of breath  Chest xray today .  Continue on Lasix  Follow up with Dr. Lake Bells in 4-6 weeks with PFT and As needed   Please contact office for sooner follow up if symptoms do not improve or worsen or seek emergency care       Acute on chronic diastolic (congestive) heart failure ([place]) Recent admission with volume overload.  Patient is improved after diuresis.  She will continue on Lasix 40 mg daily.  Low-salt diet.  Encouraged to weigh daily.     Rexene Edison, NP 03/19/2017

## 2017-03-20 NOTE — Progress Notes (Signed)
Reviewed, agree 

## 2017-03-25 ENCOUNTER — Other Ambulatory Visit: Payer: Self-pay | Admitting: Interventional Cardiology

## 2017-03-25 ENCOUNTER — Telehealth: Payer: Self-pay | Admitting: Interventional Cardiology

## 2017-03-25 NOTE — Telephone Encounter (Signed)
354pm, pt's dtr calling back and concerned she hasn't heard anything back regarding message she left this am. I explained the nurse is waiting on advisement from Dr. Tamala Julian, she states she would like a call today so she will know what to do for this evening, and if she needs an appt she would like to get her in before the weekend as last time this happened she couldn't get in and she ended up in the ER.

## 2017-03-25 NOTE — Telephone Encounter (Signed)
Spoke with daughter and made her aware that I am waiting to hear back from Dr. Tamala Julian.  Daughter concerned and asking about possible appt.  Scheduled pt to see Cecilie Kicks, NP on Friday.  Advised I would call once I hear from Dr. Tamala Julian and we can cancel the appt with Mickel Baas if he feels it is not necessary.  Daughter appreciative for call.

## 2017-03-25 NOTE — Telephone Encounter (Signed)
New Message   Pt c/o swelling: STAT is pt has developed SOB within 24 hours  1) How much weight have you gained and in what time span? 7lb in a week   2) If swelling, where is the swelling located? no  3) Are you currently taking a fluid pill? Yes Lasix  4) Are you currently SOB? No   5) Do you have a log of your daily weights (if so, list)? 2/7 150.5. 2/12 155.8 2/13 157.8   6) Have you gained 3 pounds in a day or 5 pounds in a week? yes  7) Have you traveled recently? no

## 2017-03-25 NOTE — Telephone Encounter (Signed)
Spoke with daughter, [name], [place] on file.  She states pt weighed 160lbs when leaving hospital on 1/26.  Pt currently prescribed Lasix 40mg  QD.  They have been giving an extra 20mg  on occasion when pt has weight gain and pt's weight had gotten down to 148.5lbs.  Pt's weight has been trending up over the last week or so: 2/7 150.5.  2/12 155.8  2/13 157.8  Denies SOB, wheezing, or swelling.  Pt just finished a Prednisone taper on Saturday and daughter states pt has been eating a lot of sweets.  Pt has not had medications yet this AM.  Advised daughter to do ahead and give pt AM dose of Furosemide and I will send message to Dr. Tamala Julian for advisement.

## 2017-03-26 ENCOUNTER — Ambulatory Visit (INDEPENDENT_AMBULATORY_CARE_PROVIDER_SITE_OTHER): Payer: Medicare Other | Admitting: Cardiology

## 2017-03-26 DIAGNOSIS — Z5181 Encounter for therapeutic drug level monitoring: Secondary | ICD-10-CM

## 2017-03-26 DIAGNOSIS — I4891 Unspecified atrial fibrillation: Secondary | ICD-10-CM

## 2017-03-26 LAB — POCT INR: INR: 4

## 2017-03-26 NOTE — Telephone Encounter (Signed)
Spoke with [facility] nurse again and let her know that we had suggested that pt to ER but family preferred she keep her appt for tomorrow.  Nurse wondering about pt possibly using Zaroxolyn in instances like this?  Advised I would mention this to the NP that is seeing pt tomorrow.  Nurse appreciative for call back.

## 2017-03-26 NOTE — Telephone Encounter (Signed)
Spoke with [facility] nurse, Kathlee Nations.  She states pt with firm abd, puffy face, +1 edema in rt leg and +2 edema in lt leg.  Yesterday wt was 157.8, today it is 159.2.  Pt was given Lasix 60mg  this AM at 0830.  Nurse states lungs are clear.  Pt has not urinated since taking Lasix this AM.  Spoke with Cecilie Kicks, NP who seen pt last and she advised that pt proceed to ER for possible IV diuresis.  Spoke with daughter, [name] and made her aware of this recommendation.  Daughter states they do not feel pt is at this point yet and feels like it is ok to wait until appt with Cecilie Kicks tomorrow.  Spoke with daughter about concerns of provider and why ER was recommended.  Daughter still feels like this is not necessary at this point and prefers to keep tomorrow's appt.  Advised daughter that I would let Mickel Baas know and to proceed to ER if any changes.  Daughter verbalized understanding and was in agreement with this plan.

## 2017-03-26 NOTE — Telephone Encounter (Signed)
Cecilie Kicks, NP has left for the day.  Will route this message to her.

## 2017-03-26 NOTE — Telephone Encounter (Signed)
Follow up    [facility] nurse is calling back in reference to patients swelling.    Pt c/o swelling: STAT is pt has developed SOB within 24 hours  1. How much weight have you gained and in what time span? Last Thursday  150.6  Today 159.2  2. If swelling, where is the swelling located? Face, belly firm, some in legs  3. Are you currently taking a fluid pill? Yes   4. Are you currently SOB? No   5. Do you have a log of your daily weights (if so, list)? Yes, daughter kept log.   Home from hospital  1/28 - 159 with shoes on ----Lasix was given the next morning due to wheezing   2/1 148.8 2/7 150.6 2/8 153.4*- daughter extra 20 mg of lasix -  2/9 155.6 2/11 154.2 2/12 155.8 2/13 157.8 2/14 today 159.2 - lasix given in the morning   6. Have you gained 3 pounds in a day or 5 pounds in a week? Yes   7. Have you traveled recently? no

## 2017-03-26 NOTE — Progress Notes (Signed)
Cardiology Office Note   Date:  03/27/2017   ID:  Amber, [name] 04-Mar-1934, MRN [id number]  PCP:  Antony Contras, MD  Cardiologist:   Dr. Tamala Julian     Chief Complaint  Patient presents with  . Congestive Heart Failure    wt gain at home       History of Present Illness: Amber [name] is a 83 y.o. female who presents for increased wt and facial edema per [name].   She has a hx of CAD s/p CABG in 2000, chronic atrial fibrillation on coumadin,D-CHF,COPD,CKD stage III, HLD (statin intolerant), and HTN with hypertensive heart disease, dementia and cerebrovascular disease was admitted 01/23 w/ SOB, wt up 7 lbs, COPD exacerbation.  She has had several admits.  This time D/c'd with lasix 40 daily after admit with CHF, diastolic.  3 weeks prior to this  She had been admitted with PNA.   Recent admit with COPD exacerbation.   D/c'd 03/07/17.  Her cozaar decreased to 25 mg daily with hypotension and elevated Cr.  Last visit she was weaning the steroids, she has completed ABX but also was on Macrobid for UTI.  With steroids and ABX her INR was up to 4.5 and coumadin held yesterday and today.   She has had some difficulty at home with increased wheezes and SOB.  With this her lasix was increased by her daughters which did help and with increased urinary output.  Does have a cough.  Rare chest pain and does not last but a few minutes.   Troponins in the hospital have been 0.04 to 0.06 felt to be demand ischemia from hypoxia and respiratory distress.    She is using her nebulizer.   She has renal insuff and increased lasix recently, and has been anemic.    On last visit we increased lasix to 60 mg in AM and 20 mg prn in evening.  Her CXR was improved, Her Hgb was 11.8  BNP was 253. Cr to 0.90,  K+ 3.8  Her wt has been increasing so she has been receiving 80 of lasix daily.  Then when [facility] nurse saw her yesterday her face was swollen, no SOB no edema. Per family.  Here today for eval.    Today  pulse listed at 47 in her A fib.  This rate is new for her, she is on dig MWF and BB.  No lightheadedness or dizziness.   She came off prednisone on Sat the 10th of Feb.  This week she has had more salt and that may be adding to her edema.  She denies chest pain and SOB.  She feels better overall, so sitting at computer with legs down more often, discussed after doing this to keep her legs elevated for 1-2 hours.  We discussed her salt decrease as well.    She, prior to this week, had been losing wt.  She was taking lasix 60 most mornings occ 40 mg and then 20 mg PRN in the afternoon for wt increase or SOB.  But only needed rarely.  Previously had discussed with Dr. Tamala Julian about increasing losartan her daughter stated he wanted it at higher dose for Rt heart failure.   Per Dr. Tamala Julian would hold at current dose for now.  And looking back at note it seems the losartan was increased for hypokalemia.    Also her INR is 4 and was told to hold coumadin for one night only, we will check INR  with coumadin clinic today.     Past Medical History:  Diagnosis Date  . Abnormality of gait 08/02/2014  . Cerebrovascular disease 05/21/2016  . Cervical spinal stenosis   . Chronic atrial fibrillation (HCC)    a. chronic/rate controlled;  b. chronic coumadin.  . Chronic diastolic CHF (congestive heart failure) ([place])    a. 02/2013 Echo: EF 55-60%, mild AI, mod dil LA.  . CKD (chronic kidney disease), stage III ([place])   . COPD with emphysema ([place])    PFT 05/02/10>>FEV1 1.35(62%), FEV1% 66, DLCO 75%  . Coronary artery disease    a. s/p CABG;  b. Abnormal nuc 2015 - managed medically.  . CVA (cerebral vascular accident) ([place])    left sided weakness  . Depression   . Fibromyalgia   . GERD (gastroesophageal reflux disease)    pepcid 2-3 times per week  . Gout   . Hemiparesis and alteration of sensations as late effects of stroke ([place]) 01/17/2015  . History of melanoma    squamous cell, melanoma  . Hyperlipidemia     a. statin intolerant, not felt to be candidate for PCSK9 due to chronically elevated CK levels.  . Hypertension   . Hypertensive heart disease   . Hypothyroidism   . Insomnia   . Memory change 01/23/2014  . Nasal polyposis   . Osteoarthritis   . Osteoporosis   . Pneumonia    1990  . Tobacco abuse     Past Surgical History:  Procedure Laterality Date  . ABDOMINAL HYSTERECTOMY    . BREAST LUMPECTOMY  1980s   Benign lesion - right  . carpel tunnel     right  . CORONARY ARTERY BYPASS GRAFT  2000  . EYE SURGERY     bilateral cataracts  . IR GENERIC HISTORICAL  03/31/2016   IR RADIOLOGIST EVAL & MGMT 03/31/2016 [facility]-INTERV RAD  . RADIOLOGY WITH ANESTHESIA  12/24/2011   Procedure: RADIOLOGY WITH ANESTHESIA;  Surgeon: Medication Radiologist, MD;  Location: [place];  Service: Radiology;  Laterality: N/A;  Extra Cranial Vascular Stent  . RADIOLOGY WITH ANESTHESIA N/A 07/19/2014   Procedure: ANGIOPLASTY;  Surgeon: Luanne Bras, MD;  Location: [place];  Service: Radiology;  Laterality: N/A;  . RADIOLOGY WITH ANESTHESIA N/A 07/27/2014   Procedure: ANGIOPLASTY;  Surgeon: Luanne Bras, MD;  Location: [place];  Service: Radiology;  Laterality: N/A;  . TONSILLECTOMY       Current Outpatient Medications  Medication Sig Dispense Refill  . allopurinol (ZYLOPRIM) 300 MG tablet Take 300 mg by mouth daily.    [name] amitriptyline (ELAVIL) 10 MG tablet TAKE 2 TABLETS BY MOUTH AT BEDTIME (Patient taking differently: TAKE 61m  BY MOUTH AT BEDTIME) 180 tablet 3  . amLODipine (NORVASC) 5 MG tablet Take 1 tablet (5 mg total) by mouth at bedtime. 90 tablet 0  . benzonatate (TESSALON) 100 MG capsule Take 1 capsule (100 mg total) by mouth 3 (three) times daily as needed for cough. 20 capsule 0  . celecoxib (CELEBREX) 200 MG capsule Take 200 mg by mouth every morning.     . citalopram (CELEXA) 20 MG tablet TAKE 1 TABLET BY MOUTH TWICE A DAY 60 tablet 11  . clopidogrel (PLAVIX) 75 MG tablet Take 75 mg by mouth  daily.  2  . COUMADIN 5 MG tablet TAKE AS DIRECTED BY COUMADIN CLINIC 120 tablet 1  . digoxin (LANOXIN) 0.125 MG tablet TAKE 1 TABLET (0.125 MG TOTAL) BY MOUTH ON MONDAYS, WEDNESDAYS, & FRIDAYS 36 tablet 3  .  donepezil (ARICEPT) 10 MG tablet Take 1 tablet (10 mg total) by mouth at bedtime. (Patient taking differently: Take 10 mg by mouth daily after breakfast. ) 30 tablet 5  . ergocalciferol (VITAMIN D2) 50000 UNITS capsule Take 50,000 Units by mouth 2 (two) times a week. Tuesday and Friday    . famotidine (PEPCID) 20 MG tablet Take 1 tablet (20 mg total) by mouth daily.    [name] KLOR-CON M20 20 MEQ tablet Take 0.5 tablets (10 mEq total) by mouth daily. 30 tablet 0  . levothyroxine (SYNTHROID, LEVOTHROID) 25 MCG tablet Take 25 mcg by mouth every morning.     [name] losartan (COZAAR) 25 MG tablet Take 1 tablet (25 mg total) by mouth daily. 90 tablet 3  . memantine (NAMENDA XR) 28 MG CP24 24 hr capsule TAKE ONE CAPSULE BY MOUTH EVERY DAY 90 capsule 3  . metoprolol succinate (TOPROL-XL) 25 MG 24 hr tablet TAKE 1 TABLET (25 MG TOTAL) BY MOUTH DAILY. (Patient taking differently: TAKE 1 TABLET (25 MG TOTAL) BY MOUTH DAILY. Morning) 90 tablet 2  . mometasone-formoterol (DULERA) 200-5 MCG/ACT AERO Inhale 2 puffs into the lungs 2 (two) times daily.    . nitroGLYCERIN (NITROSTAT) 0.4 MG SL tablet Place 1 tablet (0.4 mg total) under the tongue every 5 (five) minutes as needed for chest pain. 25 tablet 3  . PROAIR HFA 108 (90 BASE) MCG/ACT inhaler INHALE 2 PUFFS INTO LUNGS EVERY 6 HOURS AS NEEDED FOR WHEEZING 8.5 each 0  . Respiratory Therapy Supplies (FLUTTER) DEVI Use as directed. 1 each 0  . furosemide (LASIX) 20 MG tablet Take 3 tablets by mouth in the a.m, take 1 tablet by mouth as needed in the afternoon, depending upon weight 360 tablet 0   No current facility-administered medications for this visit.     Allergies:   Ezetimibe; Welchol [colesevelam hcl]; Adhesive [tape]; Ceclor [cefaclor]; Elastic bandages &  [zinc]; Latex; Lipitor [atorvastatin calcium]; Mevacor [lovastatin]; Pravachol; and Vasotec    Social History:  The patient  reports that she quit smoking about 40 years ago. Her smoking use included cigarettes. She has a 10.00 pack-year smoking history. she has never used smokeless tobacco. She reports that she does not drink alcohol or use drugs.   Family History:  The patient's family history includes CAD in her brother and father; Diabetes type II in her brother; Stroke in her mother.    ROS:  General:no colds or fevers, no weight changes Skin:no rashes or ulcers HEENT:no blurred vision, no congestion, some mild nose bleed with elevated INR. CV:see HPI PUL:see HPI GI:no diarrhea constipation or melena, no indigestion GU:no hematuria, no dysuria MS:no joint pain, no claudication Neuro:no syncope, no lightheadedness Endo:no diabetes, + thyroid disease  Wt Readings from Last 3 Encounters:  03/27/17 159 lb 1.9 oz (72.2 kg)  03/19/17 152 lb 6.4 oz (69.1 kg)  03/10/17 156 lb 1.9 oz (70.8 kg)     PHYSICAL EXAM: VS:  BP 128/80   Pulse (!) 47   Ht _0  (1.575 m)   Wt 159 lb 1.9 oz (72.2 kg)   SpO2 97%   BMI 29.10 kg/m  , BMI Body mass index is 29.1 kg/m. General:Pleasant affect, NAD Skin:Warm and dry, brisk capillary refill HEENT:normocephalic, sclera clear, mucus membranes moist Neck:supple, no JVD- sitting up in chair, no bruits  Heart:irreg irreg and slow with soft sytolic murmur, no gallup, rub or click Lungs:clear without rales, rhonchi, or wheezes QJJ:HERD, non tender, + BS, do not palpate liver  spleen or masses Ext:tr to 1+  lower ext edema at ankles, Lt> Rt due to brace. 2+ radial pulses Neuro:alert and oriented X 3, MAE, follows commands, + facial symmetry    EKG:  EKG is ordered today. The ekg ordered today demonstrates atrial fib at 50 , much slower than 68 on last EKG.  Continued ST depression without change.   Recent Labs: 01/23/2017: NT-Pro BNP  2,015 03/04/2017: ALT 19 03/05/2017: Magnesium 2.0; TSH 2.045 03/10/2017: BNP 253.4; BUN 24; Creatinine, Ser 0.90; Hemoglobin 11.8; Platelets 422; Potassium 3.8; Sodium 141    Lipid Panel    Component Value Date/Time   CHOL 228 (H) 03/06/2017 0410   TRIG 34 03/06/2017 0410   HDL 62 03/06/2017 0410   CHOLHDL 3.7 03/06/2017 0410   VLDL 7 03/06/2017 0410   LDLCALC 159 (H) 03/06/2017 0410   LDLDIRECT 236.0 08/02/2014 1418       Other studies Reviewed: Additional studies/ records that were reviewed today include: .  ECHO  02/09/17 Study Conclusions  - Left ventricle: The cavity size was normal. Systolic function was normal. The estimated ejection fraction was in the range of 60% to 65%. Wall motion was normal; there were no regional wall motion abnormalities. - Aortic valve: There was mild stenosis. Mean gradient (S): 9 mm Hg. Valve area (VTI): 1.41 cm^2. Valve area (Vmax): 1.38 cm^2. Valve area (Vmean): 1.25 cm^2. - Aortic root: The aortic root was normal in size. - Mitral valve: The findings are consistent with mild stenosis. Mean gradient (D): 3 mm Hg. Valve area by pressure half-time: 1.68 cm^2. Valve area by continuity equation (using LVOT flow): 1.79 cm^2. - Left atrium: The atrium was moderately dilated. - Right ventricle: The cavity size was normal. Wall thickness was normal. Systolic function was normal. - Right atrium: The atrium was normal in size. - Tricuspid valve: There was moderate regurgitation. - Pulmonary arteries: Systolic pressure was moderately increased. PA peak pressure: 52 mm Hg (S). - Inferior vena cava: The vessel was dilated. The respirophasic diameter changes were blunted (<50%), consistent with elevated central venous pressure. - Pericardium, extracardiac: There was no pericardial effusion.  Echo 01/02/17 Study Conclusions  - Left ventricle: The cavity size was normal. Wall thickness was normal. Systolic function  was normal. The estimated ejection fraction was in the range of 55% to 60%. - Aortic valve: Valve area (VTI): 1.12 cm^2. Valve area (Vmax): 1.02 cm^2. Valve area (Vmean): 0.96 cm^2. - Mitral valve: Mild functional MS with MAC peak diastolic gradient only 5 mmHg. Moderately calcified annulus. Severely thickened, severely calcified leaflets . There was mild to moderate regurgitation. Valve area by continuity equation (using LVOT flow): 1.33 cm^2. - Left atrium: The atrium was moderately dilated. - Atrial septum: No defect or patent foramen ovale was identified. - Pulmonary arteries: PA peak pressure: 49 mm Hg (S).  ASSESSMENT AND PLAN:  1.  Diastolic HF acute on chronic --will increase lasix to 40 mg tonight and then 60 in AM and 40 tomorrow evening then back to 60 mg daily and 20 mg prn in the afternoon for wt gain.  Discussed salt intake, has eaten out more and more salt overall.  She will decrease this,  Will keep legs elevated more.  She has stopped the prednisone,  This should improve.  I do not see facial edema.  Family did not notice either.   She has no SOB.  She will keep follow up with Dr. Tamala Julian 04/08/17 BMP today with hx of renal insuff.  2.  Bradycardia - she is on dig and BB, will check dig level may need to decrease or stop.  3.  Permanent atrial fib, slow today, on coumadin, INR yesterday at 4.1, rechecked today and 3.0  Instructions per pharmacy.  4.  CAD with hx CABG no chest pain  5.  Pulmonary hypertension, no SOB  6.  Recovering from COPD exacerbation, viral syndrome and HF.  7.  HLD  Intolerant to statins  Last LDL of 159 03/06/17   Current medicines are reviewed with the patient today.  The patient Has no concerns regarding medicines.  The following changes have been made:  See above Labs/ tests ordered today include:see above  Disposition:   FU:  see above  Signed, Cecilie Kicks, NP  03/27/2017 3:59 PM    [address], [address] [place], [place] Phone: [phone]; Fax: [phone]

## 2017-03-26 NOTE — Telephone Encounter (Signed)
New message    Pt c/o swelling: STAT is pt has developed SOB within 24 hours  1) How much weight have you gained and in what time span? Last Thursday  150.6  Today 159.2  2) If swelling, where is the swelling located? Face, belly firm, some in legs  3) Are you currently taking a fluid pill? Yes   4) Are you currently SOB? No   5) Do you have a log of your daily weights (if so, list)? Yes, daughter kept log.   Home from hospital  1/28 - 159 with shoes on ----Lasix was given the next morning due to wheezing   2/1 148.8 2/7 150.6 2/8 153.4*- daughter extra 20 mg of lasix -  2/9 155.6 2/11 154.2 2/12 155.8 2/13 157.8 2/14 today 159.2 - lasix given in the morning   6) Have you gained 3 pounds in a day or 5 pounds in a week? Yes   7) Have you traveled recently? no

## 2017-03-27 ENCOUNTER — Encounter: Payer: Self-pay | Admitting: Cardiology

## 2017-03-27 ENCOUNTER — Ambulatory Visit: Payer: Medicare Other | Admitting: Cardiology

## 2017-03-27 VITALS — BP 128/80 | HR 47 | Ht 62.0 in | Wt 159.1 lb

## 2017-03-27 DIAGNOSIS — Z951 Presence of aortocoronary bypass graft: Secondary | ICD-10-CM

## 2017-03-27 DIAGNOSIS — I5032 Chronic diastolic (congestive) heart failure: Secondary | ICD-10-CM

## 2017-03-27 DIAGNOSIS — R001 Bradycardia, unspecified: Secondary | ICD-10-CM

## 2017-03-27 DIAGNOSIS — I4821 Permanent atrial fibrillation: Secondary | ICD-10-CM

## 2017-03-27 DIAGNOSIS — E782 Mixed hyperlipidemia: Secondary | ICD-10-CM

## 2017-03-27 DIAGNOSIS — I5033 Acute on chronic diastolic (congestive) heart failure: Secondary | ICD-10-CM

## 2017-03-27 DIAGNOSIS — I482 Chronic atrial fibrillation: Secondary | ICD-10-CM | POA: Diagnosis not present

## 2017-03-27 DIAGNOSIS — I2510 Atherosclerotic heart disease of native coronary artery without angina pectoris: Secondary | ICD-10-CM

## 2017-03-27 MED ORDER — FUROSEMIDE 20 MG PO TABS: ORAL_TABLET | ORAL | 0 refills | Status: DC

## 2017-03-27 NOTE — Patient Instructions (Addendum)
Medication Instructions:  Your physician has recommended you make the following change in your medication:  Take a extra 40 mg of Lasix when you get home today Take a extra 40 mg of Lasix tomorrow afternoon if needed   Labwork: TODAY: DIGOXIN LEVEL & PT/INR  Testing/Procedures: None ordered  Follow-Up: Your physician recommends that you schedule a follow-up appointment in: [place]   Any Other Special Instructions Will Be Listed Below (If Applicable).     If you need a refill on your cardiac medications before your next appointment, please call your pharmacy.

## 2017-03-30 ENCOUNTER — Telehealth: Payer: Self-pay | Admitting: Interventional Cardiology

## 2017-03-30 ENCOUNTER — Other Ambulatory Visit: Payer: Self-pay | Admitting: Cardiology

## 2017-03-30 NOTE — Telephone Encounter (Signed)
Pt's daughter [name] is aware that not all pt's labs results are in at this time. [name] is aware that when the Digoxin level is in and Dr. Tamala Julian reviewed it, we will call her back with results. Daughter verbalized understanding.

## 2017-03-30 NOTE — Telephone Encounter (Signed)
New Message   Patient daughters [name] and [name]) is calling to lab results. Please call to discuss. Speak with [name].

## 2017-03-31 ENCOUNTER — Telehealth: Payer: Self-pay | Admitting: Interventional Cardiology

## 2017-03-31 ENCOUNTER — Other Ambulatory Visit: Payer: Self-pay | Admitting: Interventional Cardiology

## 2017-03-31 ENCOUNTER — Telehealth: Payer: Self-pay | Admitting: Cardiology

## 2017-03-31 LAB — BASIC METABOLIC PANEL
BUN / CREAT RATIO: 18 (ref 12–28)
BUN: 16 mg/dL (ref 8–27)
CALCIUM: 8.7 mg/dL (ref 8.7–10.3)
CHLORIDE: 103 mmol/L (ref 96–106)
CO2: 24 mmol/L (ref 20–29)
CREATININE: 0.91 mg/dL (ref 0.57–1.00)
GFR calc Af Amer: 69 mL/min/{1.73_m2} (ref >59)
GFR calc non Af Amer: 60 mL/min/{1.73_m2} (ref >59)
GLUCOSE: 84 mg/dL (ref 65–99)
Potassium: 3.8 mmol/L (ref 3.5–5.2)
Sodium: 145 mmol/L — ABNORMAL HIGH (ref 134–144)

## 2017-03-31 LAB — DIGITOXIN LEVEL

## 2017-03-31 NOTE — Telephone Encounter (Signed)
Returned the call to pts daughter, [name], [place] on file. She has been made aware that once we get the results, I would call her back.

## 2017-03-31 NOTE — Telephone Encounter (Signed)
error 

## 2017-03-31 NOTE — Telephone Encounter (Signed)
New message  Pt daughter Opal Sidles verbalized that she is calling for the RN  She is wanting the lab results so that she can administer medications

## 2017-04-01 ENCOUNTER — Telehealth: Payer: Self-pay | Admitting: Interventional Cardiology

## 2017-04-01 ENCOUNTER — Other Ambulatory Visit: Payer: Self-pay | Admitting: *Deleted

## 2017-04-01 ENCOUNTER — Encounter: Payer: Self-pay | Admitting: Interventional Cardiology

## 2017-04-01 NOTE — Telephone Encounter (Signed)
See lab results for further information.  °

## 2017-04-01 NOTE — Telephone Encounter (Signed)
Patient daughter Amber [name]) calling,  Amber [name] is not happy because patient appointment was pushed back to 04-16-17. She states that patient really needs to be seen by Dr. Tamala Julian sooner than that date.

## 2017-04-02 ENCOUNTER — Ambulatory Visit (INDEPENDENT_AMBULATORY_CARE_PROVIDER_SITE_OTHER): Payer: Medicare Other | Admitting: Cardiology

## 2017-04-02 DIAGNOSIS — Z5181 Encounter for therapeutic drug level monitoring: Secondary | ICD-10-CM | POA: Diagnosis not present

## 2017-04-02 DIAGNOSIS — I4821 Permanent atrial fibrillation: Secondary | ICD-10-CM

## 2017-04-02 LAB — POCT INR: INR: 2.8

## 2017-04-08 ENCOUNTER — Ambulatory Visit: Payer: Medicare Other | Admitting: Interventional Cardiology

## 2017-04-15 ENCOUNTER — Ambulatory Visit (INDEPENDENT_AMBULATORY_CARE_PROVIDER_SITE_OTHER): Payer: Medicare Other | Admitting: Cardiovascular Disease

## 2017-04-15 DIAGNOSIS — Z5181 Encounter for therapeutic drug level monitoring: Secondary | ICD-10-CM | POA: Diagnosis not present

## 2017-04-15 DIAGNOSIS — I482 Chronic atrial fibrillation: Secondary | ICD-10-CM | POA: Diagnosis not present

## 2017-04-15 DIAGNOSIS — I4821 Permanent atrial fibrillation: Secondary | ICD-10-CM

## 2017-04-15 LAB — POCT INR: INR: 2.5

## 2017-04-15 NOTE — Progress Notes (Signed)
Cardiology Office Note    Date:  04/16/2017   ID:  Amber, [name] 04/27/1934, MRN [id number]  PCP:  Antony Contras, MD  Cardiologist: Sinclair Grooms, MD   Chief Complaint  Patient presents with  . Coronary Artery Disease  . Congestive Heart Failure    History of Present Illness:  Amber [name] is a 83 y.o. female with a history of coronary artery disease with prior CABG in 2000, chronic atrial fibrillation, chronic diastolic heart failure, chronic kidney disease, stage III, hyperlipidemia (statin intolerant), and essential hypertension with hypertensive heart disease. Also has cerebrovascular disease and dementia.    Amber [name] is doing well.  One of her daughters is with her and has many questions and a lot of data that she has collected.  The patient denies shortness of breath.  The daughter is concerned that the weight is near 160.  A dry weight after an acute illness was 7 pounds less.  The patient is eating much better now.  She is fully hydrated.  She has not noted lower extremity swelling.  She does not sleep in bed because of aching.  She denies orthopnea.  She denies angina.   Past Medical History:  Diagnosis Date  . Abnormality of gait 08/02/2014  . Cerebrovascular disease 05/21/2016  . Cervical spinal stenosis   . Chronic atrial fibrillation (HCC)    a. chronic/rate controlled;  b. chronic coumadin.  . Chronic diastolic CHF (congestive heart failure) ([place])    a. 02/2013 Echo: EF 55-60%, mild AI, mod dil LA.  . CKD (chronic kidney disease), stage III ([place])   . COPD with emphysema ([place])    PFT 05/02/10>>FEV1 1.35(62%), FEV1% 66, DLCO 75%  . Coronary artery disease    a. s/p CABG;  b. Abnormal nuc 2015 - managed medically.  . CVA (cerebral vascular accident) ([place])    left sided weakness  . Depression   . Fibromyalgia   . GERD (gastroesophageal reflux disease)    pepcid 2-3 times per week  . Gout   . Hemiparesis and alteration of sensations as late effects of  stroke ([place]) 01/17/2015  . History of melanoma    squamous cell, melanoma  . Hyperlipidemia    a. statin intolerant, not felt to be candidate for PCSK9 due to chronically elevated CK levels.  . Hypertension   . Hypertensive heart disease   . Hypothyroidism   . Insomnia   . Memory change 01/23/2014  . Nasal polyposis   . Osteoarthritis   . Osteoporosis   . Pneumonia    1990  . Tobacco abuse     Past Surgical History:  Procedure Laterality Date  . ABDOMINAL HYSTERECTOMY    . BREAST LUMPECTOMY  1980s   Benign lesion - right  . carpel tunnel     right  . CORONARY ARTERY BYPASS GRAFT  2000  . EYE SURGERY     bilateral cataracts  . IR GENERIC HISTORICAL  03/31/2016   IR RADIOLOGIST EVAL & MGMT 03/31/2016 [facility]INTER[facility] RAD  . RADIOLOGY WITH ANESTHESIA  12/24/2011   Procedure: RADIOLOGY WITH ANESTHESIA;  Surgeon: Medication Radiologist, MD;  Location: [facility];  Service: Radiology;  Laterality: N/A;  Extra Cranial Vascular Stent  . RADIOLOGY WITH ANESTHESIA N/A 07/19/2014   Procedure: ANGIOPLASTY;  Surgeon: Luanne Bras, MD;  Location: [facility];  Service: Radiology;  Laterality: N/A;  . RADIOLOGY WITH ANESTHESIA N/A 07/27/2014   Procedure: ANGIOPLASTY;  Surgeon: Luanne Bras, MD;  Location: [facility];  Service: Radiology;  Laterality: N/A;  . TONSILLECTOMY      Current Medications: Outpatient Medications Prior to Visit  Medication Sig Dispense Refill  . allopurinol (ZYLOPRIM) 300 MG tablet Take 300 mg by mouth daily.    [name] amitriptyline (ELAVIL) 10 MG tablet TAKE 2 TABLETS BY MOUTH AT BEDTIME (Patient taking differently: TAKE 56m  BY MOUTH AT BEDTIME) 180 tablet 3  . amLODipine (NORVASC) 5 MG tablet Take 1 tablet (5 mg total) by mouth at bedtime. 90 tablet 0  . benzonatate (TESSALON) 100 MG capsule Take 1 capsule (100 mg total) by mouth 3 (three) times daily as needed for cough. 20 capsule 0  . celecoxib (CELEBREX) 200 MG capsule Take 200 mg by mouth every morning.     . citalopram  (CELEXA) 20 MG tablet TAKE 1 TABLET BY MOUTH TWICE A DAY 60 tablet 11  . clopidogrel (PLAVIX) 75 MG tablet Take 75 mg by mouth daily.  2  . COUMADIN 5 MG tablet TAKE AS DIRECTED BY COUMADIN CLINIC 120 tablet 1  . donepezil (ARICEPT) 10 MG tablet Take 1 tablet (10 mg total) by mouth at bedtime. (Patient taking differently: Take 10 mg by mouth daily after breakfast. ) 30 tablet 5  . ergocalciferol (VITAMIN D2) 50000 UNITS capsule Take 50,000 Units by mouth 2 (two) times a week. Tuesday and Friday    . famotidine (PEPCID) 20 MG tablet Take 1 tablet (20 mg total) by mouth daily.    .Marland KitchenKLOR-CON M20 20 MEQ tablet Take 0.5 tablets (10 mEq total) by mouth daily. 30 tablet 0  . levothyroxine (SYNTHROID, LEVOTHROID) 25 MCG tablet Take 25 mcg by mouth every morning.     .Marland Kitchenlosartan (COZAAR) 25 MG tablet Take 1 tablet (25 mg total) by mouth daily. 90 tablet 3  . memantine (NAMENDA XR) 28 MG CP24 24 hr capsule TAKE ONE CAPSULE BY MOUTH EVERY DAY 90 capsule 3  . metoprolol succinate (TOPROL-XL) 25 MG 24 hr tablet TAKE 1 TABLET (25 MG TOTAL) BY MOUTH DAILY. 90 tablet 3  . mometasone-formoterol (DULERA) 200-5 MCG/ACT AERO Inhale 2 puffs into the lungs 2 (two) times daily.    . nitroGLYCERIN (NITROSTAT) 0.4 MG SL tablet Place 1 tablet (0.4 mg total) under the tongue every 5 (five) minutes as needed for chest pain. 25 tablet 3  . PROAIR HFA 108 (90 BASE) MCG/ACT inhaler INHALE 2 PUFFS INTO LUNGS EVERY 6 HOURS AS NEEDED FOR WHEEZING 8.5 each 0  . Respiratory Therapy Supplies (FLUTTER) DEVI Use as directed. 1 each 0  . furosemide (LASIX) 20 MG tablet Take 3 tablets by mouth in the a.m, take 1 tablet by mouth as needed in the afternoon, depending upon weight 360 tablet 0   No facility-administered medications prior to visit.      Allergies:   Ezetimibe; Welchol [colesevelam hcl]; Adhesive [tape]; Ceclor [cefaclor]; Elastic bandages & [zinc]; Latex; Lipitor [atorvastatin calcium]; Mevacor [lovastatin]; Pravachol; and  Vasotec   Social History   Socioeconomic History  . Marital status: Married    Spouse name: None  . Number of children: 2  . Years of education: None  . Highest education level: None  Social Needs  . Financial resource strain: None  . Food insecurity - worry: None  . Food insecurity - inability: None  . Transportation needs - medical: None  . Transportation needs - non-medical: None  Occupational History  . Occupation: Retired    EFish farm manager [employer] . Occupation: SNetwork engineer  Comment: Retired  Tobacco Use  . Smoking status: Former Smoker    Packs/day: 0.40    Years: 25.00    Pack years: 10.00    Types: Cigarettes    Last attempt to quit: 02/10/1977    Years since quitting: 40.2  . Smokeless tobacco: Never Used  Substance and Sexual Activity  . Alcohol use: No  . Drug use: No  . Sexual activity: None  Other Topics Concern  . None  Social History Narrative   Patient is right handed.   Patient lives at home with husband   Patient drinks 8 oz caffeine occasionally.     Family History:  The patient's family history includes CAD in her brother and father; Diabetes type II in her brother; Stroke in her mother.   ROS:   Please see the history of present illness.    Aching when she lies flat in bed.  Family is concerned about weight gain.  Weight at last office visit February 7 was 152 current weight 161 pounds.  2 weeks ago the weight was 159 pounds. All other systems reviewed and are negative.   PHYSICAL EXAM:   VS:  BP (!) 156/80   Pulse 68   Ht 5' 6" (1.676 m)   Wt 161 lb 12.8 oz (73.4 kg)   BMI 26.12 kg/m    GEN: Well nourished, well developed, in no acute distress  HEENT: normal  Neck: no JVD, carotid bruits, or masses Cardiac: IIRR; no murmurs, rubs, or gallops,no edema  Respiratory:  clear to auscultation bilaterally, normal work of breathing GI: soft, nontender, nondistended, + BS MS: no deformity or atrophy  Skin: warm and dry, no rash Neuro:   Alert and Oriented x 3, Strength and sensation are intact Psych: euthymic mood, full affect  Wt Readings from Last 3 Encounters:  04/16/17 161 lb 12.8 oz (73.4 kg)  03/27/17 159 lb 1.9 oz (72.2 kg)  03/19/17 152 lb 6.4 oz (69.1 kg)      Studies/Labs Reviewed:   EKG:  EKG no new data  Recent Labs: 01/23/2017: NT-Pro BNP 2,015 03/04/2017: ALT 19 03/05/2017: Magnesium 2.0; TSH 2.045 03/10/2017: BNP 253.4; Hemoglobin 11.8; Platelets 422 03/27/2017: BUN 16; Creatinine, Ser 0.91; Potassium 3.8; Sodium 145   Lipid Panel    Component Value Date/Time   CHOL 228 (H) 03/06/2017 0410   TRIG 34 03/06/2017 0410   HDL 62 03/06/2017 0410   CHOLHDL 3.7 03/06/2017 0410   VLDL 7 03/06/2017 0410   LDLCALC 159 (H) 03/06/2017 0410   LDLDIRECT 236.0 08/02/2014 1418    Additional studies/ records that were reviewed today include:  No new data    ASSESSMENT:    1. Chronic diastolic heart failure ([place])   2. Permanent atrial fibrillation ([place])   3. Long term current use of anticoagulant therapy   4. Hemiparesis and alteration of sensations as late effects of stroke ([place])   5. COPD with acute exacerbation ([place])   6. Coronary artery disease involving coronary bypass graft of native heart with angina pectoris ([place])   7. Dyslipidemia      PLAN:  In order of problems listed above:  1. There is an increase in weight without evidence of volume overload on exam.  Blood pressure is relatively high.  I will adjust the diuretic regimen from 60 mg each morning and as needed 20 mg in the afternoon depending upon her weight to 40 mg twice daily.  The family should continue to wait the patient and  notify us if the weight continues to increase or if it dramatically decreases.  She will have a basic metabolic panel in 1 week.  She looks much better than on the last office visit.  Heart failure is clinically dramatically improved.  Consider discontinuation of Celebrex. 2. Heart rate is under excellent  control. 3. No bleeding complications on Coumadin therapy. 4. Not addressed 5. Seems to be controlled without wheezing. 6. No angina or other complaints. 7. Not addressed.  Overall significantly improved compared to December January timeframe.  Slight adjustment in diuretic therapy has been made.  Will follow weights.  Be met in BNP in 1 week.  Clinical follow-up with me in 4 months.  Follow-up with APP to assess volume status in 1 month.    Medication Adjustments/Labs and Tests Ordered: Current medicines are reviewed at length with the patient today.  Concerns regarding medicines are outlined above.  Medication changes, Labs and Tests ordered today are listed in the Patient Instructions below. There are no Patient Instructions on file for this visit.   Signed, Sinclair Grooms, MD  04/16/2017 12:30 PM    [facility] [address], [place], [place]  [address] Phone: [phone]; Fax: [phone]

## 2017-04-16 ENCOUNTER — Ambulatory Visit: Payer: Medicare Other | Admitting: Interventional Cardiology

## 2017-04-16 ENCOUNTER — Telehealth: Payer: Self-pay | Admitting: Interventional Cardiology

## 2017-04-16 ENCOUNTER — Encounter: Payer: Self-pay | Admitting: Interventional Cardiology

## 2017-04-16 VITALS — BP 156/80 | HR 68 | Ht 66.0 in | Wt 161.8 lb

## 2017-04-16 DIAGNOSIS — I25709 Atherosclerosis of coronary artery bypass graft(s), unspecified, with unspecified angina pectoris: Secondary | ICD-10-CM | POA: Diagnosis not present

## 2017-04-16 DIAGNOSIS — I5032 Chronic diastolic (congestive) heart failure: Secondary | ICD-10-CM | POA: Diagnosis not present

## 2017-04-16 DIAGNOSIS — I69359 Hemiplegia and hemiparesis following cerebral infarction affecting unspecified side: Secondary | ICD-10-CM

## 2017-04-16 DIAGNOSIS — I4821 Permanent atrial fibrillation: Secondary | ICD-10-CM

## 2017-04-16 DIAGNOSIS — I482 Chronic atrial fibrillation: Secondary | ICD-10-CM

## 2017-04-16 DIAGNOSIS — E785 Hyperlipidemia, unspecified: Secondary | ICD-10-CM

## 2017-04-16 DIAGNOSIS — J441 Chronic obstructive pulmonary disease with (acute) exacerbation: Secondary | ICD-10-CM | POA: Diagnosis not present

## 2017-04-16 DIAGNOSIS — Z7901 Long term (current) use of anticoagulants: Secondary | ICD-10-CM

## 2017-04-16 DIAGNOSIS — I69398 Other sequelae of cerebral infarction: Secondary | ICD-10-CM | POA: Diagnosis not present

## 2017-04-16 MED ORDER — FUROSEMIDE 40 MG PO TABS: 40.0000 mg | ORAL_TABLET | Freq: Two times a day (BID) | ORAL | 3 refills | Status: DC

## 2017-04-16 NOTE — Telephone Encounter (Signed)
Spoke with Dr. Tamala Julian and he said pt should stay off Digoxin.  Spoke with daughter, [name] and advised her of this.  [name] appreciative for call.

## 2017-04-16 NOTE — Patient Instructions (Signed)
Medication Instructions:  1) INCREASE Furosemide 40mg  twice daily  Labwork: Your physician recommends that you return for lab work in: 1 week (BMET and BNP)   Testing/Procedures: None  Follow-Up: Your physician recommends that you schedule a follow-up appointment in: 1 month with a PA or NP.  Your physician wants you to follow-up in: 4 months with Dr. Tamala Julian.  You will receive a reminder letter in the mail two months in advance. If you don't receive a letter, please call our office to schedule the follow-up appointment.    Any Other Special Instructions Will Be Listed Below (If Applicable).     If you need a refill on your cardiac medications before your next appointment, please call your pharmacy.

## 2017-04-16 NOTE — Telephone Encounter (Signed)
[name]( Daughter) is calling because Amber [name] was taken off of her Digoxin and they want to make sure that she is to be off of it . Please call

## 2017-04-22 ENCOUNTER — Telehealth: Payer: Self-pay | Admitting: Interventional Cardiology

## 2017-04-22 NOTE — Telephone Encounter (Signed)
Pt c/o swelling: STAT is pt has developed SOB within 24 hours  1) How much weight have you gained and in what time span? 3.7 lbs in about 6 days  2) If swelling, where is the swelling located? No   3) Are you currently taking a fluid pill? Yes  4) Are you currently SOB? No  5) Do you have a log of your daily weights (if so, list)? Last Thursday 04/16/17 157, Yesterday 04-21-17 16, Today   04-22-17 161.2  6) Have you gained 3 pounds in a day or 5 pounds in a week? no  7) Have you traveled recently? No

## 2017-04-22 NOTE — Telephone Encounter (Signed)
Spoke with daughter and she called about pt's wts listed below.  Denies swelling, SOB or wheezing.  Pt has been eating better.  Pt did have [employer] chicken tenders a couple of nights ago and had one small piece of pizza last night.  Daughter also states that pt has been eating the little mini candy bars.  Daughter gave pt an extra half of Lasix yesterday, so she had 60mg  in AM and 40mg  in PM.  No improvement in weight.  Advised daughter it did not sound like CHF causing wt gain and is likely diet related.  Advised to continue to monitor and let us know if wt continues to increase.  Advised I would also send message to Dr. Tamala Julian for review and would call back if he had further recommendations.

## 2017-04-22 NOTE — Telephone Encounter (Signed)
Agree 

## 2017-04-23 ENCOUNTER — Other Ambulatory Visit: Payer: Medicare Other

## 2017-04-23 ENCOUNTER — Other Ambulatory Visit: Payer: Self-pay | Admitting: Interventional Cardiology

## 2017-04-23 DIAGNOSIS — I5032 Chronic diastolic (congestive) heart failure: Secondary | ICD-10-CM

## 2017-04-23 LAB — BASIC METABOLIC PANEL
BUN/Creatinine Ratio: 11 — ABNORMAL LOW (ref 12–28)
BUN: 11 mg/dL (ref 8–27)
CALCIUM: 9.3 mg/dL (ref 8.7–10.3)
CO2: 26 mmol/L (ref 20–29)
CREATININE: 1.01 mg/dL — AB (ref 0.57–1.00)
Chloride: 102 mmol/L (ref 96–106)
GFR calc Af Amer: 61 mL/min/{1.73_m2} (ref >59)
GFR, EST NON AFRICAN AMERICAN: 53 mL/min/{1.73_m2} — AB (ref >59)
Glucose: 107 mg/dL — ABNORMAL HIGH (ref 65–99)
Potassium: 4.3 mmol/L (ref 3.5–5.2)
Sodium: 142 mmol/L (ref 134–144)

## 2017-04-23 LAB — PRO B NATRIURETIC PEPTIDE: NT-PRO BNP: 1249 pg/mL — AB (ref 0–738)

## 2017-04-28 ENCOUNTER — Ambulatory Visit: Payer: Medicare Other | Admitting: Pulmonary Disease

## 2017-04-28 NOTE — Progress Notes (Deleted)
Subjective:   PATIENT ID: Amber [name] GENDER: female DOB: Feb 05, 1935, MRN: [id number]   Synopsis: Referred in January 2019  for COPD after she was hospitalized for cough and wheeze from rhinovirus.  She previously saw Dr. Halford [name] in 2012  HPI  No chief complaint on file.  ***  Past Medical History:  Diagnosis Date  . Abnormality of gait 08/02/2014  . Cerebrovascular disease 05/21/2016  . Cervical spinal stenosis   . Chronic atrial fibrillation (HCC)    a. chronic/rate controlled;  b. chronic coumadin.  . Chronic diastolic CHF (congestive heart failure) ([place])    a. 02/2013 Echo: EF 55-60%, mild AI, mod dil LA.  . CKD (chronic kidney disease), stage III ([place])   . COPD with emphysema ([place])    PFT 05/02/10>>FEV1 1.35(62%), FEV1% 66, DLCO 75%  . Coronary artery disease    a. s/p CABG;  b. Abnormal nuc 2015 - managed medically.  . CVA (cerebral vascular accident) ([place])    left sided weakness  . Depression   . Fibromyalgia   . GERD (gastroesophageal reflux disease)    pepcid 2-3 times per week  . Gout   . Hemiparesis and alteration of sensations as late effects of stroke ([place]) 01/17/2015  . History of melanoma    squamous cell, melanoma  . Hyperlipidemia    a. statin intolerant, not felt to be candidate for PCSK9 due to chronically elevated CK levels.  . Hypertension   . Hypertensive heart disease   . Hypothyroidism   . Insomnia   . Memory change 01/23/2014  . Nasal polyposis   . Osteoarthritis   . Osteoporosis   . Pneumonia    1990  . Tobacco abuse           Review of Systems  Constitutional: Negative for fever and weight loss.  HENT: Negative for congestion, ear pain, nosebleeds and sore throat.   Eyes: Negative for redness.  Respiratory: Positive for cough, shortness of breath and wheezing.   Cardiovascular: Negative for palpitations, leg swelling and PND.  Gastrointestinal: Negative for nausea and vomiting.  Genitourinary: Negative for dysuria.  Skin:  Negative for rash.  Neurological: Negative for headaches.  Endo/Heme/Allergies: Does not bruise/bleed easily.  Psychiatric/Behavioral: Negative for depression. The patient is not nervous/anxious.       Objective:  Physical Exam   There were no vitals filed for this visit.  ***  CBC    Component Value Date/Time   WBC 9.5 03/10/2017 0937   WBC 3.7 (L) 03/06/2017 0410   RBC 4.04 03/10/2017 0937   RBC 3.34 (L) 03/06/2017 0410   HGB 11.8 03/10/2017 0937   HCT 34.3 03/10/2017 0937   PLT 422 (H) 03/10/2017 0937   MCV 85 03/10/2017 0937   MCH 29.2 03/10/2017 0937   MCH 28.7 03/06/2017 0410   MCHC 34.4 03/10/2017 0937   MCHC 32.2 03/06/2017 0410   RDW 16.2 (H) 03/10/2017 0937   LYMPHSABS 2.6 03/10/2017 0937   MONOABS 0.3 03/04/2017 1849   EOSABS 0.0 03/10/2017 0937   BASOSABS 0.0 03/10/2017 0937     Chest imaging: January 2019 chest x-ray images independently reviewed showing sternal wires, slightly enlarged cardiac silhouette, emphysema  PFT: 2012 spirometry ratio 66%, FEV1 1.35 L 62% predicted, FVC 2.05 L 71% predicted, DLCO 18.40 mL 75 %/day 2014 spirometry reviewed not acceptable by ATS Amber [name]  Labs:  Path:  Echo:  Heart Catheterization:  Other: 02/2017 Modified barium swallow> normal, rec regular diet  Hospitalization records from  January 2019 reviewed where she was treated for COPD exacerbation     Assessment & Plan:   No diagnosis found.  ***   Current Outpatient Medications:  .  allopurinol (ZYLOPRIM) 300 MG tablet, Take 300 mg by mouth daily., Disp: , Rfl:  .  amitriptyline (ELAVIL) 10 MG tablet, TAKE 2 TABLETS BY MOUTH AT BEDTIME (Patient taking differently: TAKE 20mg   BY MOUTH AT BEDTIME), Disp: 180 tablet, Rfl: 3 .  amLODipine (NORVASC) 5 MG tablet, Take 1 tablet (5 mg total) by mouth at bedtime., Disp: 90 tablet, Rfl: 0 .  amLODipine (NORVASC) 5 MG tablet, Take 1 tablet (5 mg total) by mouth at bedtime., Disp: 90 tablet, Rfl: 3 .  benzonatate  (TESSALON) 100 MG capsule, Take 1 capsule (100 mg total) by mouth 3 (three) times daily as needed for cough., Disp: 20 capsule, Rfl: 0 .  celecoxib (CELEBREX) 200 MG capsule, Take 200 mg by mouth every morning. , Disp: , Rfl:  .  citalopram (CELEXA) 20 MG tablet, TAKE 1 TABLET BY MOUTH TWICE A DAY, Disp: 60 tablet, Rfl: 11 .  clopidogrel (PLAVIX) 75 MG tablet, Take 75 mg by mouth daily., Disp: , Rfl: 2 .  COUMADIN 5 MG tablet, TAKE AS DIRECTED BY COUMADIN CLINIC, Disp: 120 tablet, Rfl: 1 .  donepezil (ARICEPT) 10 MG tablet, Take 1 tablet (10 mg total) by mouth at bedtime. (Patient taking differently: Take 10 mg by mouth daily after breakfast. ), Disp: 30 tablet, Rfl: 5 .  ergocalciferol (VITAMIN D2) 50000 UNITS capsule, Take 50,000 Units by mouth 2 (two) times a week. Tuesday and Friday, Disp: , Rfl:  .  famotidine (PEPCID) 20 MG tablet, Take 1 tablet (20 mg total) by mouth daily., Disp: , Rfl:  .  furosemide (LASIX) 40 MG tablet, Take 1 tablet (40 mg total) by mouth 2 (two) times daily., Disp: 180 tablet, Rfl: 3 .  KLOR-CON M20 20 MEQ tablet, Take 0.5 tablets (10 mEq total) by mouth daily., Disp: 30 tablet, Rfl: 0 .  levothyroxine (SYNTHROID, LEVOTHROID) 25 MCG tablet, Take 25 mcg by mouth every morning. , Disp: , Rfl:  .  losartan (COZAAR) 25 MG tablet, Take 1 tablet (25 mg total) by mouth daily., Disp: 90 tablet, Rfl: 3 .  memantine (NAMENDA XR) 28 MG CP24 24 hr capsule, TAKE ONE CAPSULE BY MOUTH EVERY DAY, Disp: 90 capsule, Rfl: 3 .  metoprolol succinate (TOPROL-XL) 25 MG 24 hr tablet, TAKE 1 TABLET (25 MG TOTAL) BY MOUTH DAILY., Disp: 90 tablet, Rfl: 3 .  mometasone-formoterol (DULERA) 200-5 MCG/ACT AERO, Inhale 2 puffs into the lungs 2 (two) times daily., Disp: , Rfl:  .  nitroGLYCERIN (NITROSTAT) 0.4 MG SL tablet, Place 1 tablet (0.4 mg total) under the tongue every 5 (five) minutes as needed for chest pain., Disp: 25 tablet, Rfl: 3 .  PROAIR HFA 108 (90 BASE) MCG/ACT inhaler, INHALE 2 PUFFS  INTO LUNGS EVERY 6 HOURS AS NEEDED FOR WHEEZING, Disp: 8.5 each, Rfl: 0 .  Respiratory Therapy Supplies (FLUTTER) DEVI, Use as directed., Disp: 1 each, Rfl: 0

## 2017-05-05 ENCOUNTER — Other Ambulatory Visit: Payer: Self-pay | Admitting: Neurology

## 2017-05-05 ENCOUNTER — Telehealth: Payer: Self-pay | Admitting: Neurology

## 2017-05-05 MED ORDER — DONEPEZIL HCL 10 MG PO TABS: 10.0000 mg | ORAL_TABLET | Freq: Every day | ORAL | 2 refills | Status: DC

## 2017-05-05 NOTE — Telephone Encounter (Signed)
Dr. Jannifer Franklin- how do you want to proceed?

## 2017-05-05 NOTE — Telephone Encounter (Signed)
Pt's daughter called she scheduled an appt for 6/11 with Dr Jannifer Franklin (1st available). She said the pt did not get up until around noon and needed an appt after 3pm, NP's did not have an availabilty. Pt is out of donepezil (ARICEPT) 10 MG tablet and needs refill, it has been denied since pt did not have an appt. She is aware if the provider approves the refill and the pt does not keep her appt he may not give refills again until she is actually seen. Please call to advise on refill

## 2017-05-05 NOTE — Telephone Encounter (Signed)
I will send in a prescription for the Aricept.

## 2017-05-06 ENCOUNTER — Ambulatory Visit: Payer: Medicare Other | Admitting: *Deleted

## 2017-05-06 DIAGNOSIS — Z5181 Encounter for therapeutic drug level monitoring: Secondary | ICD-10-CM

## 2017-05-06 DIAGNOSIS — I4891 Unspecified atrial fibrillation: Secondary | ICD-10-CM

## 2017-05-06 DIAGNOSIS — I4821 Permanent atrial fibrillation: Secondary | ICD-10-CM

## 2017-05-06 LAB — POCT INR: INR: 1.9

## 2017-05-06 NOTE — Patient Instructions (Signed)
Description   Today take 1.5 tablets, then  continue taking 1 tablet daily except 1.5 tablets on Tuesdays and Saturdays. Continue 1 serving each week of leafy green vegetables and  of your [place]aklee protein shake 3 times each week.   Recheck INR in 2 weeks.  Call with any questions or problems [phone]

## 2017-05-07 ENCOUNTER — Ambulatory Visit: Payer: Medicare Other | Admitting: Cardiology

## 2017-05-13 ENCOUNTER — Ambulatory Visit (INDEPENDENT_AMBULATORY_CARE_PROVIDER_SITE_OTHER): Payer: Medicare Other | Admitting: Pulmonary Disease

## 2017-05-13 DIAGNOSIS — J441 Chronic obstructive pulmonary disease with (acute) exacerbation: Secondary | ICD-10-CM

## 2017-05-13 LAB — PULMONARY FUNCTION TEST
DL/VA % pred: 82 %
DL/VA: 3.91 ml/min/mmHg/L
DLCO UNC % PRED: 56 %
DLCO unc: 13.41 ml/min/mmHg
FEF 25-75 PRE: 0.58 L/s
FEF 25-75 Post: 1.31 L/sec
FEF2575-%CHANGE-POST: 124 %
FEF2575-%PRED-POST: 96 %
FEF2575-%Pred-Pre: 43 %
FEV1-%CHANGE-POST: 26 %
FEV1-%Pred-Post: 66 %
FEV1-%Pred-Pre: 52 %
FEV1-POST: 1.24 L
FEV1-Pre: 0.98 L
FEV1FVC-%CHANGE-POST: 3 %
FEV1FVC-%Pred-Pre: 92 %
FEV6-%CHANGE-POST: 23 %
FEV6-%Pred-Post: 73 %
FEV6-%Pred-Pre: 59 %
FEV6-PRE: 1.42 L
FEV6-Post: 1.75 L
FEV6FVC-%Change-Post: 0 %
FEV6FVC-%PRED-PRE: 104 %
FEV6FVC-%Pred-Post: 105 %
FVC-%CHANGE-POST: 22 %
FVC-%PRED-POST: 69 %
FVC-%Pred-Pre: 57 %
FVC-PRE: 1.44 L
FVC-Post: 1.76 L
POST FEV1/FVC RATIO: 71 %
PRE FEV6/FVC RATIO: 99 %
Post FEV6/FVC ratio: 99 %
Pre FEV1/FVC ratio: 68 %
RV % PRED: 106 %
RV: 2.52 L
TLC % pred: 90 %
TLC: 4.48 L

## 2017-05-13 NOTE — Progress Notes (Signed)
PFT done today. 

## 2017-05-15 ENCOUNTER — Ambulatory Visit: Payer: Medicare Other | Admitting: *Deleted

## 2017-05-15 DIAGNOSIS — I4891 Unspecified atrial fibrillation: Secondary | ICD-10-CM | POA: Diagnosis not present

## 2017-05-15 DIAGNOSIS — I4821 Permanent atrial fibrillation: Secondary | ICD-10-CM

## 2017-05-15 DIAGNOSIS — Z5181 Encounter for therapeutic drug level monitoring: Secondary | ICD-10-CM

## 2017-05-15 LAB — POCT INR: INR: 2.2

## 2017-05-15 NOTE — Patient Instructions (Signed)
Description   Continue taking 1 tablet daily except 1.5 tablets on Tuesdays and Saturdays. Continue 1 serving each week of leafy green vegetables and  of your Shaklee protein shake 3 times each week.   Recheck INR in 1 week.  Call with any questions or problems [phone]

## 2017-05-22 ENCOUNTER — Ambulatory Visit (INDEPENDENT_AMBULATORY_CARE_PROVIDER_SITE_OTHER): Payer: Medicare Other | Admitting: Cardiology

## 2017-05-22 ENCOUNTER — Encounter (INDEPENDENT_AMBULATORY_CARE_PROVIDER_SITE_OTHER): Payer: Self-pay

## 2017-05-22 ENCOUNTER — Encounter: Payer: Self-pay | Admitting: Cardiology

## 2017-05-22 ENCOUNTER — Ambulatory Visit (INDEPENDENT_AMBULATORY_CARE_PROVIDER_SITE_OTHER): Payer: Medicare Other | Admitting: *Deleted

## 2017-05-22 VITALS — BP 170/84 | HR 76 | Ht 64.0 in | Wt 161.8 lb

## 2017-05-22 DIAGNOSIS — I5032 Chronic diastolic (congestive) heart failure: Secondary | ICD-10-CM

## 2017-05-22 DIAGNOSIS — E785 Hyperlipidemia, unspecified: Secondary | ICD-10-CM

## 2017-05-22 DIAGNOSIS — I10 Essential (primary) hypertension: Secondary | ICD-10-CM

## 2017-05-22 DIAGNOSIS — J42 Unspecified chronic bronchitis: Secondary | ICD-10-CM

## 2017-05-22 DIAGNOSIS — Z5181 Encounter for therapeutic drug level monitoring: Secondary | ICD-10-CM | POA: Diagnosis not present

## 2017-05-22 DIAGNOSIS — Z7901 Long term (current) use of anticoagulants: Secondary | ICD-10-CM

## 2017-05-22 DIAGNOSIS — I482 Chronic atrial fibrillation: Secondary | ICD-10-CM | POA: Diagnosis not present

## 2017-05-22 DIAGNOSIS — I2510 Atherosclerotic heart disease of native coronary artery without angina pectoris: Secondary | ICD-10-CM

## 2017-05-22 DIAGNOSIS — R410 Disorientation, unspecified: Secondary | ICD-10-CM

## 2017-05-22 DIAGNOSIS — I4891 Unspecified atrial fibrillation: Secondary | ICD-10-CM

## 2017-05-22 DIAGNOSIS — I4821 Permanent atrial fibrillation: Secondary | ICD-10-CM

## 2017-05-22 LAB — POCT INR: INR: 2.5

## 2017-05-22 NOTE — Patient Instructions (Addendum)
Medication Instructions:   Your physician recommends that you continue on your current medications as directed. Please refer to the Current Medication list given to you today.   If you need a refill on your cardiac medications before your next appointment, please call your pharmacy.  Labwork: NONE ORDERED  TODAY    Testing/Procedures: NONE ORDERED  TODAY   Follow-Up:  4 TO 6 WEEKS WITH [facility]    4 MONTHS WITH DR Tamala Julian    Any Other Special Instructions Will Be Listed Below (If Applicable).

## 2017-05-22 NOTE — Progress Notes (Signed)
Cardiology Office Note   Date:  05/22/2017   ID:  Amber [name], DOB 19-Aug-1934, MRN [id number]  PCP:  Antony Contras, MD  Cardiologist:  Dr. Valaria Good     Chief Complaint  Patient presents with  . Shortness of Breath    and confusion today       History of Present Illness: Amber [name] is a 83 y.o. female who presents for eval of her diastolic HF.   She has a history of coronary artery disease with prior CABG in 2000, chronic atrial fibrillation, chronic diastolic heart failure, chronic kidney disease, stage III, hyperlipidemia (statin intolerant), and essential hypertensionwith hypertensive heart disease.Also has cerebrovascular disease and dementia. Her dig was stopped due to bradycardia,    She last saw Dr. Tamala Julian 04/16/17 with wt gain with her chronic diastolic HF.  Lasix was changed to 40 mg BID.  Family is great at weighing pt.  She was doing well on coumadin at that time.     Labs a week after visit with Cr 1.01, BUN 11 and K+ 4.3  Pro bnp was 1249 down from 2015 3 months prior.    Today some confusion.  Family is afraid of infection.  Temp 97.5   On ROS back pain is present.  She may have been seeing children at night outside her window.  No children were present.  Her daughter believes she has UTI and pt does appear tired.  She has fullness in abd and Rt flank pain.  They do have appt with PCP at 4:11 today.    Her BP is elevated, and remained elevated on recheck.  It may be due to infection. No chest pain and no SOB, she feels she is back to baseline and her daughter agrees.  She and her husband still eat out but she has decreased some salt.     Past Medical History:  Diagnosis Date  . Abnormality of gait 08/02/2014  . Cerebrovascular disease 05/21/2016  . Cervical spinal stenosis   . Chronic atrial fibrillation (HCC)    a. chronic/rate controlled;  b. chronic coumadin.  . Chronic diastolic CHF (congestive heart failure) ([place])    a. 02/2013 Echo: EF 55-60%, mild AI,  mod dil LA.  . CKD (chronic kidney disease), stage III ([place])   . COPD with emphysema ([place])    PFT 05/02/10>>FEV1 1.35(62%), FEV1% 66, DLCO 75%  . Coronary artery disease    a. s/p CABG;  b. Abnormal nuc 2015 - managed medically.  . CVA (cerebral vascular accident) ([place])    left sided weakness  . Depression   . Fibromyalgia   . GERD (gastroesophageal reflux disease)    pepcid 2-3 times per week  . Gout   . Hemiparesis and alteration of sensations as late effects of stroke ([place]) 01/17/2015  . History of melanoma    squamous cell, melanoma  . Hyperlipidemia    a. statin intolerant, not felt to be candidate for PCSK9 due to chronically elevated CK levels.  . Hypertension   . Hypertensive heart disease   . Hypothyroidism   . Insomnia   . Memory change 01/23/2014  . Nasal polyposis   . Osteoarthritis   . Osteoporosis   . Pneumonia    1990  . Tobacco abuse     Past Surgical History:  Procedure Laterality Date  . ABDOMINAL HYSTERECTOMY    . BREAST LUMPECTOMY  1980s   Benign lesion - right  . carpel tunnel     right  .  CORONARY ARTERY BYPASS GRAFT  2000  . EYE SURGERY     bilateral cataracts  . IR GENERIC HISTORICAL  03/31/2016   IR RADIOLOGIST EVAL & MGMT 03/31/2016 [facility]INTER[facility] RAD  . RADIOLOGY WITH ANESTHESIA  12/24/2011   Procedure: RADIOLOGY WITH ANESTHESIA;  Surgeon: Medication Radiologist, MD;  Location: [place];  Service: Radiology;  Laterality: N/A;  Extra Cranial Vascular Stent  . RADIOLOGY WITH ANESTHESIA N/A 07/19/2014   Procedure: ANGIOPLASTY;  Surgeon: Luanne Bras, MD;  Location: [facility];  Service: Radiology;  Laterality: N/A;  . RADIOLOGY WITH ANESTHESIA N/A 07/27/2014   Procedure: ANGIOPLASTY;  Surgeon: Luanne Bras, MD;  Location: [place];  Service: Radiology;  Laterality: N/A;  . TONSILLECTOMY       Current Outpatient Medications  Medication Sig Dispense Refill  . allopurinol (ZYLOPRIM) 300 MG tablet Take 300 mg by mouth daily.    [name] amitriptyline (ELAVIL)  10 MG tablet TAKE 2 TABLETS BY MOUTH AT BEDTIME (Patient taking differently: TAKE 20mg   BY MOUTH AT BEDTIME) 180 tablet 3  . amLODipine (NORVASC) 5 MG tablet Take 1 tablet (5 mg total) by mouth at bedtime. 90 tablet 3  . benzonatate (TESSALON) 100 MG capsule Take 1 capsule (100 mg total) by mouth 3 (three) times daily as needed for cough. 20 capsule 0  . celecoxib (CELEBREX) 200 MG capsule Take 200 mg by mouth every morning.     . citalopram (CELEXA) 20 MG tablet TAKE 1 TABLET BY MOUTH TWICE A DAY 60 tablet 11  . clopidogrel (PLAVIX) 75 MG tablet Take 75 mg by mouth daily.  2  . COUMADIN 5 MG tablet TAKE AS DIRECTED BY COUMADIN CLINIC 120 tablet 1  . donepezil (ARICEPT) 10 MG tablet Take 1 tablet (10 mg total) by mouth at bedtime. 30 tablet 2  . ergocalciferol (VITAMIN D2) 50000 UNITS capsule Take 50,000 Units by mouth 2 (two) times a week. Tuesday and Friday    . famotidine (PEPCID) 20 MG tablet Take 1 tablet (20 mg total) by mouth daily.    . furosemide (LASIX) 40 MG tablet Take 1 tablet (40 mg total) by mouth 2 (two) times daily. 180 tablet 3  . KLOR-CON M20 20 MEQ tablet Take 0.5 tablets (10 mEq total) by mouth daily. 30 tablet 0  . levothyroxine (SYNTHROID, LEVOTHROID) 25 MCG tablet Take 25 mcg by mouth every morning.     [name] losartan (COZAAR) 25 MG tablet Take 1 tablet (25 mg total) by mouth daily. 90 tablet 3  . memantine (NAMENDA XR) 28 MG CP24 24 hr capsule TAKE ONE CAPSULE BY MOUTH EVERY DAY 90 capsule 3  . metoprolol succinate (TOPROL-XL) 25 MG 24 hr tablet TAKE 1 TABLET (25 MG TOTAL) BY MOUTH DAILY. 90 tablet 3  . mometasone-formoterol (DULERA) 200-5 MCG/ACT AERO Inhale 2 puffs into the lungs 2 (two) times daily.    . nitroGLYCERIN (NITROSTAT) 0.4 MG SL tablet Place 1 tablet (0.4 mg total) under the tongue every 5 (five) minutes as needed for chest pain. 25 tablet 3  . PROAIR HFA 108 (90 BASE) MCG/ACT inhaler INHALE 2 PUFFS INTO LUNGS EVERY 6 HOURS AS NEEDED FOR WHEEZING 8.5 each 0  .  Respiratory Therapy Supplies (FLUTTER) DEVI Use as directed. 1 each 0   No current facility-administered medications for this visit.     Allergies:   Ezetimibe; Welchol [colesevelam hcl]; Adhesive [tape]; Ceclor [cefaclor]; Elastic bandages & [zinc]; Latex; Lipitor [atorvastatin calcium]; Mevacor [lovastatin]; Pravachol; and Vasotec    Social History:  The patient  reports that she quit smoking about 40 years ago. Her smoking use included cigarettes. She has a 10.00 pack-year smoking history. She has never used smokeless tobacco. She reports that she does not drink alcohol or use drugs.   Family History:  The patient's family history includes CAD in her brother and father; Diabetes type II in her brother; Stroke in her mother.    ROS:  General:no colds or fevers, no weight changes wt at home is 158 to 161. Skin:+ rash on Rt neck has seen dermatologist and given doxycycline no ulcers HEENT:no blurred vision, no congestion CV:see HPI PUL:see HPI GI:no diarrhea constipation or melena, no indigestion GU:no hematuria, no dysuria, but some lower abd discomfort and Rt flank pain MS:no joint pain, no claudication Neuro:no syncope, no lightheadedness Endo:no diabetes, + thyroid disease  Wt Readings from Last 3 Encounters:  05/22/17 161 lb 12.8 oz (73.4 kg)  04/16/17 161 lb 12.8 oz (73.4 kg)  03/27/17 159 lb 1.9 oz (72.2 kg)     PHYSICAL EXAM: VS:  BP (!) 170/84   Pulse 76   Ht 5\' 4"  (1.626 m)   Wt 161 lb 12.8 oz (73.4 kg)   SpO2 94%   BMI 27.77 kg/m  , BMI Body mass index is 27.77 kg/m. General:Pleasant affect, NAD Skin:Warm and dry, brisk capillary refill HEENT:normocephalic, sclera clear, mucus membranes moist Neck:supple, no JVD, no bruits sitting up in chair  Heart:S1S2 RRR without murmur, gallup, rub or click Lungs:clear without rales, rhonchi, or wheezes XHB:ZJIR, + tenderness of lower abd., + BS, do not palpate liver spleen or masses Ext:no lower ext edema, 2+ pedal  pulses, 2+ radial pulses Neuro:alert and oriented X 3, MAE, follows commands, + facial symmetry    EKG:  EKG is NOT ordered today.   Recent Labs: 03/04/2017: ALT 19 03/05/2017: Magnesium 2.0; TSH 2.045 03/10/2017: BNP 253.4; Hemoglobin 11.8; Platelets 422 04/23/2017: BUN 11; Creatinine, Ser 1.01; NT-Pro BNP 1,249; Potassium 4.3; Sodium 142    Lipid Panel    Component Value Date/Time   CHOL 228 (H) 03/06/2017 0410   TRIG 34 03/06/2017 0410   HDL 62 03/06/2017 0410   CHOLHDL 3.7 03/06/2017 0410   VLDL 7 03/06/2017 0410   LDLCALC 159 (H) 03/06/2017 0410   LDLDIRECT 236.0 08/02/2014 1418       Other studies Reviewed: Additional studies/ records that were reviewed today include: . Echo 2018 Study Conclusions  - Left ventricle: The cavity size was normal. Systolic function was   normal. The estimated ejection fraction was in the range of 60%   to 65%. Wall motion was normal; there were no regional wall   motion abnormalities. - Aortic valve: There was mild stenosis. Mean gradient (S): 9 mm   Hg. Valve area (VTI): 1.41 cm^2. Valve area (Vmax): 1.38 cm^2.   Valve area (Vmean): 1.25 cm^2. - Aortic root: The aortic root was normal in size. - Mitral valve: The findings are consistent with mild stenosis.   Mean gradient (D): 3 mm Hg. Valve area by pressure half-time:   1.68 cm^2. Valve area by continuity equation (using LVOT flow):   1.79 cm^2. - Left atrium: The atrium was moderately dilated. - Right ventricle: The cavity size was normal. Wall thickness was   normal. Systolic function was normal. - Right atrium: The atrium was normal in size. - Tricuspid valve: There was moderate regurgitation. - Pulmonary arteries: Systolic pressure was moderately increased.   PA peak pressure: 52 mm Hg (S). -  Inferior vena cava: The vessel was dilated. The respirophasic   diameter changes were blunted (< 50%), consistent with elevated   central venous pressure. - Pericardium, extracardiac:  There was no pericardial effusion.   ASSESSMENT AND PLAN:  1.  Chronic diastolic HF stable today on lasix 40 mg BID last Cr was 1.01 and BUN 11 continue lasix at current dose, I will see back in 4-6 weeks and Dr. Tamala Julian in 3-4 months.  2.  HTN elevated much higher than her norm.  If no UTI daughter will call back and we should increase toprol to 37.5 daily.  And have family to call if any problems.  I am concerned with infection if present this is causing increase of BP.   3.   Some confusion, some lower abd discomfort and Rt flank pain. Going to PCP for possible UTI  4.   Permanent a fib stable rate no bradycardia since stopping Dig.  5.    Anticoagulation for INR check today.for a fib  6.    COPD stable. To follow up with pulmonary  7.  CAD with hx CABG no angina  8.     HLD intolerant to statins and zetia and welchol   Current medicines are reviewed with the patient today.  The patient Has no concerns regarding medicines.  The following changes have been made:  See above Labs/ tests ordered today include:see above  Disposition:   FU:  see above  Signed, Cecilie Kicks, NP  05/22/2017 3:07 PM    [facility] [place], [address] [place], [place] Phone: [phone]; Fax: [phone]

## 2017-05-22 NOTE — Patient Instructions (Signed)
Description   Continue taking 1 tablet daily except 1.5 tablets on Tuesdays and Saturdays. Continue 1 serving each week of leafy green vegetables and  of your Shaklee protein shake 3 times each week.   Recheck INR in 2 weeks.  Call with any questions or problems [phone]

## 2017-05-27 ENCOUNTER — Ambulatory Visit: Payer: Medicare Other | Admitting: Pulmonary Disease

## 2017-05-27 ENCOUNTER — Encounter: Payer: Self-pay | Admitting: Pulmonary Disease

## 2017-05-27 VITALS — BP 138/70 | HR 67 | Ht 66.0 in | Wt 166.0 lb

## 2017-05-27 DIAGNOSIS — I5033 Acute on chronic diastolic (congestive) heart failure: Secondary | ICD-10-CM

## 2017-05-27 DIAGNOSIS — J300 Vasomotor rhinitis: Secondary | ICD-10-CM

## 2017-05-27 DIAGNOSIS — J441 Chronic obstructive pulmonary disease with (acute) exacerbation: Secondary | ICD-10-CM | POA: Diagnosis not present

## 2017-05-27 DIAGNOSIS — J449 Chronic obstructive pulmonary disease, unspecified: Secondary | ICD-10-CM

## 2017-05-27 NOTE — Progress Notes (Signed)
Subjective:   PATIENT ID: Amber [name] GENDER: female DOB: 1934-09-01, MRN: [id number]   Synopsis: Referred in January 2019  for COPD after she was hospitalized for cough and wheeze from rhinovirus.  She previously saw Dr. Halford [name] in 2012. Has GERD and allergic rhinitis  HPI  Chief Complaint  Patient presents with  . Follow-up    voices no complaints, currently on ABT for UTI   Apparently she had a little confusion lately and there was concern for a UTI.  Apparently she was treated with an antibiotic.  Her breathing is "fine" and she isn't coughing up much right now.  Her daughter says that her breathing and wheezing improved after she had her lasix dosing changes.    She stil lhas some dyspnea with exertion.  Compared to February her weight is up 10-12 pounds.    At home her weight has been 162 pounds in the last few months, their goal for her is 160 pounds. Her weight today was 166 at home.    She has not been taking the allergic rhinitis regimen we recommended  She has not been taking the COPD medicines we recommended  Past Medical History:  Diagnosis Date  . Abnormality of gait 08/02/2014  . Cerebrovascular disease 05/21/2016  . Cervical spinal stenosis   . Chronic atrial fibrillation (HCC)    a. chronic/rate controlled;  b. chronic coumadin.  . Chronic diastolic CHF (congestive heart failure) ([place])    a. 02/2013 Echo: EF 55-60%, mild AI, mod dil LA.  . CKD (chronic kidney disease), stage III ([place])   . COPD with emphysema ([place])    PFT 05/02/10>>FEV1 1.35(62%), FEV1% 66, DLCO 75%  . Coronary artery disease    a. s/p CABG;  b. Abnormal nuc 2015 - managed medically.  . CVA (cerebral vascular accident) ([place])    left sided weakness  . Depression   . Fibromyalgia   . GERD (gastroesophageal reflux disease)    pepcid 2-3 times per week  . Gout   . Hemiparesis and alteration of sensations as late effects of stroke ([place]) 01/17/2015  . History of melanoma    squamous cell,  melanoma  . Hyperlipidemia    a. statin intolerant, not felt to be candidate for PCSK9 due to chronically elevated CK levels.  . Hypertension   . Hypertensive heart disease   . Hypothyroidism   . Insomnia   . Memory change 01/23/2014  . Nasal polyposis   . Osteoarthritis   . Osteoporosis   . Pneumonia    1990  . Tobacco abuse           Review of Systems  Constitutional: Negative for fever and weight loss.  HENT: Negative for congestion, ear pain, nosebleeds and sore throat.   Eyes: Negative for redness.  Respiratory: Positive for cough, shortness of breath and wheezing.   Cardiovascular: Negative for palpitations, leg swelling and PND.  Gastrointestinal: Negative for nausea and vomiting.  Genitourinary: Negative for dysuria.  Skin: Negative for rash.  Neurological: Negative for headaches.  Endo/Heme/Allergies: Does not bruise/bleed easily.  Psychiatric/Behavioral: Negative for depression. The patient is not nervous/anxious.       Objective:  Physical Exam   Vitals:   05/27/17 1008  BP: 138/70  Pulse: 67  SpO2: 98%  Weight: 166 lb (75.3 kg)  Height: 5\' 6"  (1.676 m)   RA  Gen: chronically ill appearing HENT: OP clear, TM's clear, neck supple PULM: CTA B, normal percussion CV: RRR, no mgr,  trace ankle edema GI: BS+, soft, nontender Derm: no cyanosis or rash Psyche: normal mood and affect   CBC    Component Value Date/Time   WBC 9.5 03/10/2017 0937   WBC 3.7 (L) 03/06/2017 0410   RBC 4.04 03/10/2017 0937   RBC 3.34 (L) 03/06/2017 0410   HGB 11.8 03/10/2017 0937   HCT 34.3 03/10/2017 0937   PLT 422 (H) 03/10/2017 0937   MCV 85 03/10/2017 0937   MCH 29.2 03/10/2017 0937   MCH 28.7 03/06/2017 0410   MCHC 34.4 03/10/2017 0937   MCHC 32.2 03/06/2017 0410   RDW 16.2 (H) 03/10/2017 0937   LYMPHSABS 2.6 03/10/2017 0937   MONOABS 0.3 03/04/2017 1849   EOSABS 0.0 03/10/2017 0937   BASOSABS 0.0 03/10/2017 0937     Chest imaging: January 2019 chest  x-ray images independently reviewed showing sternal wires, slightly enlarged cardiac silhouette, emphysema  PFT: 2012 spirometry ratio 66%, FEV1 1.35 L 62% predicted, FVC 2.05 L 71% predicted, DLCO 18.40 mL 75 %/day 2014 spirometry reviewed not acceptable by ATS criteria April 2019 ratio 68%, FEV1 0.98 L, changed to 1.24 L 66% predicted 26% change, total lung capacity 4.5 L 90% predicted, DLCO 13.41 mL 56% predicted  Labs:  Path:  Echo:  Heart Catheterization:  Other: 02/2017 Modified barium swallow> normal, rec regular diet   Records from her cardiology visit on April 12 reviewed where she was seen for chronic diastolic heart failure.  She was maintained on her current dose of diuretics.  She uses furosemide.  She has A. fib.  Digoxin had been held this year.  There was mention of a urinary tract infection.     Assessment & Plan:   COPD with acute exacerbation ([place])  Acute on chronic diastolic (congestive) heart failure (HCC)  Chronic obstructive pulmonary disease, unspecified COPD type ([place])  Vasomotor rhinitis  Discussion: Amber [name] has been doing better since she recovered from the rhinovirus.  Her volume status is up a bit today though it seems that the major problems she has had with her breathing in 2019 has been related to her diastolic heart failure.  She has actually not been taking any of her medicines for her COPD and her breathing has improved.  She is not taking the allergic rhinitis treatment we recommended.  She says that when she takes furosemide she does not go to the bathroom right away.  Given the volume retention this week I believe what is going on is that she has developed a tolerance to the dose of diuretic she is currently taking.  Plan: COPD: The lung function test showed moderate airflow obstruction but really no significant change compared to 7 years ago Take Dulera 2 puffs twice a day no matter how you feel Use albuterol as needed for chest tightness  wheezing or shortness of breath  Allergic rhinitis: This is a runny nose associated with scratchy throat Take Flonase 2 sprays each nostril daily Take over-the-counter generic cetirizine (Zyrtec)  Vasomotor rhinitis: This is the type of watery runny nose that happens whenever you eat Use ipratropium nasal spray for this as needed  Diastolic heart failure: It is very important that you control your sodium intake and limit your fluid intake.  Limit sodium to less than 2 g a day. Weigh yourself every day and the same outfit and keep a record of your weight Because your weight is up today and you are holding onto fluid you need to take 60 mg of Lasix twice a  day for the next 5 days Call cardiology and ask them for direction on how to adjust the diuretics afterwards  Follow-up with me in 1 year or sooner if needed   Current Outpatient Medications:  .  allopurinol (ZYLOPRIM) 300 MG tablet, Take 300 mg by mouth daily., Disp: , Rfl:  .  amitriptyline (ELAVIL) 10 MG tablet, TAKE 2 TABLETS BY MOUTH AT BEDTIME (Patient taking differently: TAKE 20mg   BY MOUTH AT BEDTIME), Disp: 180 tablet, Rfl: 3 .  amLODipine (NORVASC) 5 MG tablet, Take 1 tablet (5 mg total) by mouth at bedtime., Disp: 90 tablet, Rfl: 3 .  benzonatate (TESSALON) 100 MG capsule, Take 1 capsule (100 mg total) by mouth 3 (three) times daily as needed for cough., Disp: 20 capsule, Rfl: 0 .  celecoxib (CELEBREX) 200 MG capsule, Take 200 mg by mouth every morning. , Disp: , Rfl:  .  citalopram (CELEXA) 20 MG tablet, TAKE 1 TABLET BY MOUTH TWICE A DAY, Disp: 60 tablet, Rfl: 11 .  clopidogrel (PLAVIX) 75 MG tablet, Take 75 mg by mouth daily., Disp: , Rfl: 2 .  COUMADIN 5 MG tablet, TAKE AS DIRECTED BY COUMADIN CLINIC, Disp: 120 tablet, Rfl: 1 .  donepezil (ARICEPT) 10 MG tablet, Take 1 tablet (10 mg total) by mouth at bedtime., Disp: 30 tablet, Rfl: 2 .  ergocalciferol (VITAMIN D2) 50000 UNITS capsule, Take 50,000 Units by mouth 2 (two)  times a week. Tuesday and Friday, Disp: , Rfl:  .  famotidine (PEPCID) 20 MG tablet, Take 1 tablet (20 mg total) by mouth daily., Disp: , Rfl:  .  furosemide (LASIX) 40 MG tablet, Take 1 tablet (40 mg total) by mouth 2 (two) times daily., Disp: 180 tablet, Rfl: 3 .  KLOR-CON M20 20 MEQ tablet, Take 0.5 tablets (10 mEq total) by mouth daily., Disp: 30 tablet, Rfl: 0 .  levothyroxine (SYNTHROID, LEVOTHROID) 25 MCG tablet, Take 25 mcg by mouth every morning. , Disp: , Rfl:  .  losartan (COZAAR) 25 MG tablet, Take 1 tablet (25 mg total) by mouth daily., Disp: 90 tablet, Rfl: 3 .  memantine (NAMENDA XR) 28 MG CP24 24 hr capsule, TAKE ONE CAPSULE BY MOUTH EVERY DAY, Disp: 90 capsule, Rfl: 3 .  metoprolol succinate (TOPROL-XL) 25 MG 24 hr tablet, TAKE 1 TABLET (25 MG TOTAL) BY MOUTH DAILY., Disp: 90 tablet, Rfl: 3 .  mometasone-formoterol (DULERA) 200-5 MCG/ACT AERO, Inhale 2 puffs into the lungs 2 (two) times daily., Disp: , Rfl:  .  nitroGLYCERIN (NITROSTAT) 0.4 MG SL tablet, Place 1 tablet (0.4 mg total) under the tongue every 5 (five) minutes as needed for chest pain., Disp: 25 tablet, Rfl: 3 .  PROAIR HFA 108 (90 BASE) MCG/ACT inhaler, INHALE 2 PUFFS INTO LUNGS EVERY 6 HOURS AS NEEDED FOR WHEEZING, Disp: 8.5 each, Rfl: 0 .  Respiratory Therapy Supplies (FLUTTER) DEVI, Use as directed., Disp: 1 each, Rfl: 0

## 2017-05-27 NOTE — Patient Instructions (Signed)
COPD: The lung function test showed moderate airflow obstruction but really no significant change compared to 7 years ago Take Dulera 2 puffs twice a day no matter how you feel Use albuterol as needed for chest tightness wheezing or shortness of breath  Allergic rhinitis: This is a runny nose associated with scratchy throat Take Flonase 2 sprays each nostril daily Take over-the-counter generic cetirizine (Zyrtec)  Vasomotor rhinitis: This is the type of watery runny nose that happens whenever you eat Use ipratropium nasal spray for this as needed  Diastolic heart failure: It is very important that you control your sodium intake and limit your fluid intake.  Limit sodium to less than 2 g a day. Weigh yourself every day and the same outfit and keep a record of your weight Because your weight is up today and you are holding onto fluid you need to take 60 mg of Lasix twice a day for the next 5 days Call cardiology and ask them for direction on how to adjust the diuretics afterwards  Follow-up with me in 1 year or sooner if needed

## 2017-06-02 ENCOUNTER — Ambulatory Visit (INDEPENDENT_AMBULATORY_CARE_PROVIDER_SITE_OTHER): Payer: Medicare Other | Admitting: *Deleted

## 2017-06-02 DIAGNOSIS — Z5181 Encounter for therapeutic drug level monitoring: Secondary | ICD-10-CM | POA: Diagnosis not present

## 2017-06-02 DIAGNOSIS — I4891 Unspecified atrial fibrillation: Secondary | ICD-10-CM

## 2017-06-02 LAB — POCT INR: INR: 2.4

## 2017-06-02 NOTE — Patient Instructions (Signed)
Description   Continue taking 1 tablet daily except 1.5 tablets on Tuesdays and Saturdays. Continue 1 serving each week of leafy green vegetables and  of your Shaklee protein shake 3 times each week.   Recheck INR in 3 weeks.  Call with any questions or problems [phone]

## 2017-06-23 ENCOUNTER — Ambulatory Visit (INDEPENDENT_AMBULATORY_CARE_PROVIDER_SITE_OTHER): Payer: Medicare Other | Admitting: Cardiology

## 2017-06-23 ENCOUNTER — Encounter (INDEPENDENT_AMBULATORY_CARE_PROVIDER_SITE_OTHER): Payer: Self-pay

## 2017-06-23 ENCOUNTER — Encounter: Payer: Self-pay | Admitting: Cardiology

## 2017-06-23 ENCOUNTER — Ambulatory Visit (INDEPENDENT_AMBULATORY_CARE_PROVIDER_SITE_OTHER): Payer: Medicare Other | Admitting: *Deleted

## 2017-06-23 VITALS — BP 138/70 | HR 62 | Ht 65.0 in | Wt 164.0 lb

## 2017-06-23 DIAGNOSIS — I10 Essential (primary) hypertension: Secondary | ICD-10-CM | POA: Diagnosis not present

## 2017-06-23 DIAGNOSIS — I4891 Unspecified atrial fibrillation: Secondary | ICD-10-CM | POA: Diagnosis not present

## 2017-06-23 DIAGNOSIS — Z79899 Other long term (current) drug therapy: Secondary | ICD-10-CM | POA: Diagnosis not present

## 2017-06-23 DIAGNOSIS — Z5181 Encounter for therapeutic drug level monitoring: Secondary | ICD-10-CM

## 2017-06-23 DIAGNOSIS — I5032 Chronic diastolic (congestive) heart failure: Secondary | ICD-10-CM | POA: Diagnosis not present

## 2017-06-23 DIAGNOSIS — I482 Chronic atrial fibrillation: Secondary | ICD-10-CM

## 2017-06-23 DIAGNOSIS — I4821 Permanent atrial fibrillation: Secondary | ICD-10-CM

## 2017-06-23 DIAGNOSIS — Z7901 Long term (current) use of anticoagulants: Secondary | ICD-10-CM

## 2017-06-23 DIAGNOSIS — I2510 Atherosclerotic heart disease of native coronary artery without angina pectoris: Secondary | ICD-10-CM

## 2017-06-23 LAB — POCT INR: INR: 2.1

## 2017-06-23 NOTE — Patient Instructions (Signed)
Description   Continue taking 1 tablet daily except 1.5 tablets on Tuesdays and Saturdays. Continue 1 serving each week of leafy green vegetables.  Recheck INR in 4 weeks.  Call with any questions or problems [phone]

## 2017-06-23 NOTE — Patient Instructions (Addendum)
Medication Instructions:  Your physician recommends that you continue on your current medications as directed. Please refer to the Current Medication list given to you today.   Labwork: TODAY:  BMET & PRO BNP  Testing/Procedures: None ordered  Follow-Up: Your physician recommends that you schedule a follow-up appointment in: SEE DR. Tamala Julian AS PLANNED   Any Other Special Instructions Will Be Listed Below (If Applicable).     If you need a refill on your cardiac medications before your next appointment, please call your pharmacy.

## 2017-06-23 NOTE — Progress Notes (Addendum)
Cardiology Office Note   Date:  06/23/2017   ID:  Amber, [name] [birth year], MRN [id number]  PCP:  Antony Contras, MD  Cardiologist:  Dr. Tamala Julian    Chief Complaint  Patient presents with  . Shortness of Breath    chronic diastolic HF      History of Present Illness: Amber [name] is a 83 y.o. female who presents for diastolic heart failure  She has a history of coronary artery disease with prior CABG in 2000, chronic atrial fibrillation, chronic diastolic heart failure, chronic kidney disease, stage III, hyperlipidemia (statin intolerant), and essential hypertensionwith hypertensive heart disease.Also has cerebrovascular disease and dementia.Her dig was stopped due to bradycardia,    She last saw Dr. Tamala Julian 04/16/17 with wt gain with her chronic diastolic HF.  Lasix was changed to 40 mg BID.  Family is great at weighing pt.  She was doing well on coumadin at that time.     Labs a week after visit with Cr 1.01, BUN 11 and K+ 4.3  Pro bnp was 1249 down from 2015 3 months prior.    Last visit 05/22/17 some confusion.  Family was afraid of infection.  Temp 97.5   On ROS back pain is present.  She may have been seeing children at night outside her window.  No children were present.  Her daughter believed she had UTI and pt does appear tired.  She has fullness in abd and Rt flank pain.  They saw PCP at 4:11 that day.  Her BP was elevated, and remained elevated on recheck.   but once UTI treated BP normalized.    Today she and her daughter report that her wt was staying 166 lbs so Dr [place]  Increased her lasix to 60 mg BID.  She has remained on that dose and is doing well.  Her wt came down 2 lbs. she has SOB but not as severe.  No syncope or falls.      Past Medical History:  Diagnosis Date  . Abnormality of gait 08/02/2014  . Cerebrovascular disease 05/21/2016  . Cervical spinal stenosis   . Chronic atrial fibrillation (HCC)    a. chronic/rate controlled;  b. chronic  coumadin.  . Chronic diastolic CHF (congestive heart failure) ([place])    a. 02/2013 Echo: EF 55-60%, mild AI, mod dil LA.  . CKD (chronic kidney disease), stage III ([place])   . COPD with emphysema ([place])    PFT 05/02/10>>FEV1 1.35(62%), FEV1% 66, DLCO 75%  . Coronary artery disease    a. s/p CABG;  b. Abnormal nuc 2015 - managed medically.  . CVA (cerebral vascular accident) ([place])    left sided weakness  . Depression   . Fibromyalgia   . GERD (gastroesophageal reflux disease)    pepcid 2-3 times per week  . Gout   . Hemiparesis and alteration of sensations as late effects of stroke ([place]) 01/17/2015  . History of melanoma    squamous cell, melanoma  . Hyperlipidemia    a. statin intolerant, not felt to be candidate for PCSK9 due to chronically elevated CK levels.  . Hypertension   . Hypertensive heart disease   . Hypothyroidism   . Insomnia   . Memory change 01/23/2014  . Nasal polyposis   . Osteoarthritis   . Osteoporosis   . Pneumonia    1990  . Tobacco abuse     Past Surgical History:  Procedure Laterality Date  . ABDOMINAL HYSTERECTOMY    .  BREAST LUMPECTOMY  1980s   Benign lesion - right  . carpel tunnel     right  . CORONARY ARTERY BYPASS GRAFT  2000  . EYE SURGERY     bilateral cataracts  . IR GENERIC HISTORICAL  03/31/2016   IR RADIOLOGIST EVAL & MGMT 03/31/2016 [facility]  . RADIOLOGY WITH ANESTHESIA  12/24/2011   Procedure: RADIOLOGY WITH ANESTHESIA;  Surgeon: Medication Radiologist, MD;  Location: [facility];  Service: Radiology;  Laterality: N/A;  Extra Cranial Vascular Stent  . RADIOLOGY WITH ANESTHESIA N/A 07/19/2014   Procedure: ANGIOPLASTY;  Surgeon: Luanne Bras, MD;  Location: [facility];  Service: Radiology;  Laterality: N/A;  . RADIOLOGY WITH ANESTHESIA N/A 07/27/2014   Procedure: ANGIOPLASTY;  Surgeon: Luanne Bras, MD;  Location: [facility];  Service: Radiology;  Laterality: N/A;  . TONSILLECTOMY       Current Outpatient Medications  Medication Sig  Dispense Refill  . allopurinol (ZYLOPRIM) 300 MG tablet Take 300 mg by mouth daily.    [name] amitriptyline (ELAVIL) 10 MG tablet TAKE 2 TABLETS BY MOUTH AT BEDTIME (Patient taking differently: TAKE 77m  BY MOUTH AT BEDTIME) 180 tablet 3  . amLODipine (NORVASC) 5 MG tablet Take 1 tablet (5 mg total) by mouth at bedtime. 90 tablet 3  . benzonatate (TESSALON) 100 MG capsule Take 1 capsule (100 mg total) by mouth 3 (three) times daily as needed for cough. 20 capsule 0  . celecoxib (CELEBREX) 200 MG capsule Take 200 mg by mouth every morning.     . citalopram (CELEXA) 20 MG tablet TAKE 1 TABLET BY MOUTH TWICE A DAY 60 tablet 11  . clopidogrel (PLAVIX) 75 MG tablet Take 75 mg by mouth daily.  2  . COUMADIN 5 MG tablet TAKE AS DIRECTED BY COUMADIN CLINIC 120 tablet 1  . donepezil (ARICEPT) 10 MG tablet Take 1 tablet (10 mg total) by mouth at bedtime. 30 tablet 2  . ergocalciferol (VITAMIN D2) 50000 UNITS capsule Take 50,000 Units by mouth 2 (two) times a week. Tuesday and Friday    . famotidine (PEPCID) 20 MG tablet Take 1 tablet (20 mg total) by mouth daily.    .Marland KitchenKLOR-CON M20 20 MEQ tablet Take 0.5 tablets (10 mEq total) by mouth daily. 30 tablet 0  . levothyroxine (SYNTHROID, LEVOTHROID) 25 MCG tablet Take 25 mcg by mouth every morning.     .Marland Kitchenlosartan (COZAAR) 25 MG tablet Take 1 tablet (25 mg total) by mouth daily. 90 tablet 3  . memantine (NAMENDA XR) 28 MG CP24 24 hr capsule TAKE ONE CAPSULE BY MOUTH EVERY DAY 90 capsule 3  . metoprolol succinate (TOPROL-XL) 25 MG 24 hr tablet TAKE 1 TABLET (25 MG TOTAL) BY MOUTH DAILY. 90 tablet 3  . mometasone-formoterol (DULERA) 200-5 MCG/ACT AERO Inhale 2 puffs into the lungs 2 (two) times daily.    . nitroGLYCERIN (NITROSTAT) 0.4 MG SL tablet Place 1 tablet (0.4 mg total) under the tongue every 5 (five) minutes as needed for chest pain. 25 tablet 3  . PROAIR HFA 108 (90 BASE) MCG/ACT inhaler INHALE 2 PUFFS INTO LUNGS EVERY 6 HOURS AS NEEDED FOR WHEEZING 8.5 each 0   . Respiratory Therapy Supplies (FLUTTER) DEVI Use as directed. 1 each 0   No current facility-administered medications for this visit.     Allergies:   Ezetimibe; Welchol [colesevelam hcl]; Adhesive [tape]; Ceclor [cefaclor]; Elastic bandages & [zinc]; Latex; Lipitor [atorvastatin calcium]; Mevacor [lovastatin]; Pravachol; and Vasotec    Social History:  The  patient  reports that she quit smoking about 40 years ago. Her smoking use included cigarettes. She has a 10.00 pack-year smoking history. She has never used smokeless tobacco. She reports that she does not drink alcohol or use drugs.   Family History:  The patient's family history includes CAD in her brother and father; Diabetes type II in her brother; Stroke in her mother.    ROS:  General:no colds or fevers, no weight changes Skin:no rashes or ulcers HEENT:no blurred vision, no congestion CV:see HPI PUL:see HPI GI:no diarrhea constipation or melena, no indigestion GU:no hematuria, no dysuria MS:no joint pain, no claudication Neuro:no syncope, no lightheadedness Endo:no diabetes, no thyroid disease  Wt Readings from Last 3 Encounters:  06/23/17 164 lb (74.4 kg)  05/27/17 166 lb (75.3 kg)  05/22/17 161 lb 12.8 oz (73.4 kg)     PHYSICAL EXAM: VS:  BP 138/70   Pulse 62   Ht _0  (1.651 m)   Wt 164 lb (74.4 kg)   SpO2 97%   BMI 27.29 kg/m  , BMI Body mass index is 27.29 kg/m. General:Pleasant affect, NAD Skin:Warm and dry, brisk capillary refill HEENT:normocephalic, sclera clear, mucus membranes moist Neck:supple, no JVD sitting up in chair, no bruits  Heart:irreg irreg without murmur, gallup, rub or click Lungs:clear without rales, rhonchi, or wheezes IHW:TUUE, non tender, + BS, do not palpate liver spleen or masses Ext:no lower ext edema,  2+ radial pulses, wears brace on Lt leg since CVA Neuro:alert and oriented X 3, MAE, follows commands, + facial symmetry    EKG:  EKG is NOT ordered today.    Recent  Labs: 03/04/2017: ALT 19 03/05/2017: Magnesium 2.0; TSH 2.045 03/10/2017: BNP 253.4; Hemoglobin 11.8; Platelets 422 04/23/2017: BUN 11; Creatinine, Ser 1.01; NT-Pro BNP 1,249; Potassium 4.3; Sodium 142    Lipid Panel    Component Value Date/Time   CHOL 228 (H) 03/06/2017 0410   TRIG 34 03/06/2017 0410   HDL 62 03/06/2017 0410   CHOLHDL 3.7 03/06/2017 0410   VLDL 7 03/06/2017 0410   LDLCALC 159 (H) 03/06/2017 0410   LDLDIRECT 236.0 08/02/2014 1418       Other studies Reviewed: Additional studies/ records that were reviewed today include: . Echo 02/09/18  Study Conclusions  - Left ventricle: The cavity size was normal. Systolic function was   normal. The estimated ejection fraction was in the range of 60%   to 65%. Wall motion was normal; there were no regional wall   motion abnormalities. - Aortic valve: There was mild stenosis. Mean gradient (S): 9 mm   Hg. Valve area (VTI): 1.41 cm^2. Valve area (Vmax): 1.38 cm^2.   Valve area (Vmean): 1.25 cm^2. - Aortic root: The aortic root was normal in size. - Mitral valve: The findings are consistent with mild stenosis.   Mean gradient (D): 3 mm Hg. Valve area by pressure half-time:   1.68 cm^2. Valve area by continuity equation (using LVOT flow):   1.79 cm^2. - Left atrium: The atrium was moderately dilated. - Right ventricle: The cavity size was normal. Wall thickness was   normal. Systolic function was normal. - Right atrium: The atrium was normal in size. - Tricuspid valve: There was moderate regurgitation. - Pulmonary arteries: Systolic pressure was moderately increased.   PA peak pressure: 52 mm Hg (S). - Inferior vena cava: The vessel was dilated. The respirophasic   diameter changes were blunted (< 50%), consistent with elevated   central venous pressure. - Pericardium, extracardiac: There  was no pericardial effusion.  Echo 01/02/18 Study Conclusions  - Left ventricle: The cavity size was normal. Wall thickness was    normal. Systolic function was normal. The estimated ejection   fraction was in the range of 55% to 60%. - Aortic valve: Valve area (VTI): 1.12 cm^2. Valve area (Vmax):   1.02 cm^2. Valve area (Vmean): 0.96 cm^2. - Mitral valve: Mild functional MS with MAC peak diastolic gradient   only 5 mmHg. Moderately calcified annulus. Severely thickened,   severely calcified leaflets . There was mild to moderate   regurgitation. Valve area by continuity equation (using LVOT   flow): 1.33 cm^2. - Left atrium: The atrium was moderately dilated. - Atrial septum: No defect or patent foramen ovale was identified. - Pulmonary arteries: PA peak pressure: 49 mm Hg (S).   ASSESSMENT AND PLAN:  1.  Chronic diastolic HF with recent wt gain and increase of lasix to 60 mg BID.  Stable today and will check pro BNP and BMP.  Her wt is stable and down from pk of 166 lbs, when first out of the hospital she had wheezes, those are gone.    If lasix does not work as well would consider change to torsemide.    2.  Permanent a fib.  Rate controlled and not brady after stopping dig.    3.  Anticoagulation on coumadin - to be checked today.    4.  Recent presumed UTI treated and improved.  ua though was neg.    5.  BP controlled today, last visit was elevated.  ? Due to infection.  6.  CAD with hx CABG no angina stable  7.  HLD intolerant to statins and welchol.    Keep follow up in August with Dr. Tamala Julian.  She will call if wt increases.      Current medicines are reviewed with the patient today.  The patient Has no concerns regarding medicines.  The following changes have been made:  See above Labs/ tests ordered today include:see above  Disposition:   FU:  see above  Signed, Cecilie Kicks, NP  06/23/2017 2:45 PM    [facility] [place], [place] [place], [place] Phone: [phone]; Fax: [phone]

## 2017-06-24 ENCOUNTER — Telehealth: Payer: Self-pay | Admitting: *Deleted

## 2017-06-24 ENCOUNTER — Other Ambulatory Visit: Payer: Self-pay | Admitting: Neurology

## 2017-06-24 DIAGNOSIS — Z79899 Other long term (current) drug therapy: Secondary | ICD-10-CM

## 2017-06-24 LAB — BASIC METABOLIC PANEL
BUN / CREAT RATIO: 12 (ref 12–28)
BUN: 11 mg/dL (ref 8–27)
CHLORIDE: 100 mmol/L (ref 96–106)
CO2: 27 mmol/L (ref 20–29)
Calcium: 9.5 mg/dL (ref 8.7–10.3)
Creatinine, Ser: 0.89 mg/dL (ref 0.57–1.00)
GFR calc non Af Amer: 61 mL/min/{1.73_m2} (ref >59)
GFR, EST AFRICAN AMERICAN: 71 mL/min/{1.73_m2} (ref >59)
GLUCOSE: 103 mg/dL — AB (ref 65–99)
POTASSIUM: 4 mmol/L (ref 3.5–5.2)
Sodium: 142 mmol/L (ref 134–144)

## 2017-06-24 LAB — PRO B NATRIURETIC PEPTIDE: NT-Pro BNP: 1271 pg/mL — ABNORMAL HIGH (ref 0–738)

## 2017-06-24 NOTE — Telephone Encounter (Signed)
-----   Message from Isaiah Serge, NP sent at 06/24/2017  8:54 AM EDT ----- Labs are stable. K+ is 4.0 BNP is same as 2 months ago.  Continue lasix at 60 mg BID.  Recheck BMP in 4 weeks.

## 2017-07-21 ENCOUNTER — Encounter: Payer: Self-pay | Admitting: Neurology

## 2017-07-21 ENCOUNTER — Other Ambulatory Visit: Payer: Self-pay | Admitting: Neurology

## 2017-07-21 ENCOUNTER — Other Ambulatory Visit: Payer: Medicare Other

## 2017-07-21 ENCOUNTER — Ambulatory Visit: Payer: Medicare Other | Admitting: Neurology

## 2017-07-21 ENCOUNTER — Ambulatory Visit: Payer: Medicare Other | Admitting: *Deleted

## 2017-07-21 VITALS — BP 150/62 | HR 72 | Ht 65.0 in | Wt 161.5 lb

## 2017-07-21 DIAGNOSIS — I4891 Unspecified atrial fibrillation: Secondary | ICD-10-CM

## 2017-07-21 DIAGNOSIS — Z5181 Encounter for therapeutic drug level monitoring: Secondary | ICD-10-CM

## 2017-07-21 DIAGNOSIS — R413 Other amnesia: Secondary | ICD-10-CM | POA: Diagnosis not present

## 2017-07-21 DIAGNOSIS — I679 Cerebrovascular disease, unspecified: Secondary | ICD-10-CM

## 2017-07-21 DIAGNOSIS — Z79899 Other long term (current) drug therapy: Secondary | ICD-10-CM

## 2017-07-21 LAB — POCT INR: INR: 1.3 — AB (ref 2.0–3.0)

## 2017-07-21 MED ORDER — MEMANTINE HCL 10 MG PO TABS: 10.0000 mg | ORAL_TABLET | Freq: Two times a day (BID) | ORAL | 3 refills | Status: DC

## 2017-07-21 MED ORDER — DONEPEZIL HCL 10 MG PO TABS: 10.0000 mg | ORAL_TABLET | Freq: Every day | ORAL | 3 refills | Status: DC

## 2017-07-21 NOTE — Patient Instructions (Signed)
Description   Today take 2 tablets, tomorrow take 1.5 tablets, then Continue taking 1 tablet daily except 1.5 tablets on Tuesdays and Saturdays. Continue 1 serving each week of leafy green vegetables.  Recheck INR in 10 days.  Call with any questions or problems [phone]

## 2017-07-21 NOTE — Progress Notes (Signed)
Reason for visit: Memory disturbance, cerebrovascular disease  Amber [name] is an 83 y.o. female  History of present illness:  Amber [name] is an 83 year old right-handed white female with a history of cerebrovascular disease with mild left hemiparesis.  The patient has a gait disorder associated with this.  She has developed a memory disorder that has gradually changed over the last year.  She has been in the emergency room with hospitalizations on 3 occasions in the last year for pneumonia.  Last hospitalization was in January 2019.  The patient is on Aricept and Namenda, she believes it has been a change in her memory over time.  She does not operate a motor vehicle, she has not driven a car in 5 years.  She lives with her husband, they go out to dinner every day, she otherwise does not eat much during the day.  She will sleep to about 12 noon every morning.  The patient does fall on occasion, she uses a walker for ambulation.  She comes back for an evaluation.  Past Medical History:  Diagnosis Date  . Abnormality of gait 08/02/2014  . Cerebrovascular disease 05/21/2016  . Cervical spinal stenosis   . Chronic atrial fibrillation (HCC)    a. chronic/rate controlled;  b. chronic coumadin.  . Chronic diastolic CHF (congestive heart failure) ([place])    a. 02/2013 Echo: EF 55-60%, mild AI, mod dil LA.  . CKD (chronic kidney disease), stage III ([place])   . COPD with emphysema ([place])    PFT 05/02/10>>FEV1 1.35(62%), FEV1% 66, DLCO 75%  . Coronary artery disease    a. s/p CABG;  b. Abnormal nuc 2015 - managed medically.  . CVA (cerebral vascular accident) ([place])    left sided weakness  . Depression   . Fibromyalgia   . GERD (gastroesophageal reflux disease)    pepcid 2-3 times per week  . Gout   . Hemiparesis and alteration of sensations as late effects of stroke ([place]) 01/17/2015  . History of melanoma    squamous cell, melanoma  . Hyperlipidemia    a. statin intolerant, not felt to be  candidate for PCSK9 due to chronically elevated CK levels.  . Hypertension   . Hypertensive heart disease   . Hypothyroidism   . Insomnia   . Memory change 01/23/2014  . Nasal polyposis   . Osteoarthritis   . Osteoporosis   . Pneumonia    1990  . Tobacco abuse     Past Surgical History:  Procedure Laterality Date  . ABDOMINAL HYSTERECTOMY    . BREAST LUMPECTOMY  1980s   Benign lesion - right  . carpel tunnel     right  . CORONARY ARTERY BYPASS GRAFT  2000  . EYE SURGERY     bilateral cataracts  . IR GENERIC HISTORICAL  03/31/2016   IR RADIOLOGIST EVAL & MGMT 03/31/2016 [facility]  . RADIOLOGY WITH ANESTHESIA  12/24/2011   Procedure: RADIOLOGY WITH ANESTHESIA;  Surgeon: Medication Radiologist, MD;  Location: [facility];  Service: Radiology;  Laterality: N/A;  Extra Cranial Vascular Stent  . RADIOLOGY WITH ANESTHESIA N/A 07/19/2014   Procedure: ANGIOPLASTY;  Surgeon: Luanne Bras, MD;  Location: [facility];  Service: Radiology;  Laterality: N/A;  . RADIOLOGY WITH ANESTHESIA N/A 07/27/2014   Procedure: ANGIOPLASTY;  Surgeon: Luanne Bras, MD;  Location: [facility];  Service: Radiology;  Laterality: N/A;  . TONSILLECTOMY      Family History  Problem Relation Age of Onset  . Stroke  Mother   . CAD Father   . Diabetes type II Brother   . CAD Brother     Social history:  reports that she quit smoking about 40 years ago. Her smoking use included cigarettes. She has a 10.00 pack-year smoking history. She has never used smokeless tobacco. She reports that she does not drink alcohol or use drugs.    Allergies  Allergen Reactions  . Ezetimibe Other (See Comments)    Myalgia  . Welchol [Colesevelam Hcl] Other (See Comments)    Muscle aches  . Adhesive [Tape] Itching, Rash and Other (See Comments)    Burning   . Ceclor [Cefaclor] Other (See Comments)    Unknown allergic reaction  . Elastic Bandages & [Zinc] Rash and Other (See Comments)    Turns red on the areas it touches  .  Latex Itching, Rash and Other (See Comments)    Burning   . Lipitor [Atorvastatin Calcium] Other (See Comments)    Increased fibromyalgia pain  . Mevacor [Lovastatin] Other (See Comments)    Increased fibromyalgia pain  . Pravachol Other (See Comments)    Increased fibromyalgia pain  . Vasotec Other (See Comments)    Unknown allergic reaction    Medications:  Prior to Admission medications   Medication Sig Start Date End Date Taking? Authorizing Provider  allopurinol (ZYLOPRIM) 300 MG tablet Take 300 mg by mouth daily.   Yes [provider]  amitriptyline (ELAVIL) 10 MG tablet TAKE 2 TABLETS BY MOUTH AT BEDTIME Patient taking differently: TAKE 20mg   BY MOUTH AT BEDTIME 09/01/16  Yes Kathrynn Ducking, MD  amLODipine (NORVASC) 5 MG tablet Take 1 tablet (5 mg total) by mouth at bedtime. 04/23/17  Yes Belva Crome, MD  celecoxib (CELEBREX) 200 MG capsule Take 200 mg by mouth every morning.    Yes [provider]  citalopram (CELEXA) 20 MG tablet TAKE 1 TABLET BY MOUTH TWICE A DAY 11/09/14  Yes Darlin Coco, MD  clopidogrel (PLAVIX) 75 MG tablet Take 75 mg by mouth daily. 02/04/17  Yes [provider]  COUMADIN 5 MG tablet TAKE AS DIRECTED BY COUMADIN CLINIC 03/11/17  Yes Belva Crome, MD  donepezil (ARICEPT) 10 MG tablet Take 1 tablet (10 mg total) by mouth at bedtime. 05/05/17  Yes Kathrynn Ducking, MD  ergocalciferol (VITAMIN D2) 50000 UNITS capsule Take 50,000 Units by mouth 2 (two) times a week. Tuesday and Friday   Yes [provider]  famotidine (PEPCID) 20 MG tablet Take 1 tablet (20 mg total) by mouth daily. 02/18/17  Yes Georgette Shell, MD  furosemide (LASIX) 40 MG tablet Take 60 mg by mouth 2 (two) times daily.   Yes [provider]  levothyroxine (SYNTHROID, LEVOTHROID) 25 MCG tablet Take 25 mcg by mouth every morning.    Yes [provider]  losartan (COZAAR) 25 MG tablet Take 1 tablet (25 mg total) by mouth daily.  03/07/17 03/02/18 Yes Oswald Hillock, MD  memantine (NAMENDA XR) 28 MG CP24 24 hr capsule TAKE ONE CAPSULE BY MOUTH EVERY DAY 07/15/16  Yes Kathrynn Ducking, MD  metoprolol succinate (TOPROL-XL) 25 MG 24 hr tablet TAKE 1 TABLET (25 MG TOTAL) BY MOUTH DAILY. 03/31/17  Yes Belva Crome, MD  mometasone-formoterol [facility]) 200-5 MCG/ACT AERO Inhale 2 puffs into the lungs 2 (two) times daily.   Yes [provider]  nitroGLYCERIN (NITROSTAT) 0.4 MG SL tablet Place 1 tablet (0.4 mg total) under the tongue every 5 (  five) minutes as needed for chest pain. 09/13/12  Yes Darlin Coco, MD  potassium chloride SA (K-DUR,KLOR-CON) 20 MEQ tablet Take 20 mEq by mouth daily.   Yes [provider]  PROAIR HFA 108 (90 BASE) MCG/ACT inhaler INHALE 2 PUFFS INTO LUNGS EVERY 6 HOURS AS NEEDED FOR WHEEZING 12/22/13  Yes Darlin Coco, MD  Respiratory Therapy Supplies (FLUTTER) DEVI Use as directed. 03/19/17  Yes Parrett, Tammy S, NP    ROS:  Out of a complete 14 system review of symptoms, the patient complains only of the following symptoms, and all other reviewed systems are negative.  Joint pain Memory disturbance  Blood pressure (!) 150/62, pulse 72, height 5\' 5"  (1.651 m), weight 161 lb 8 oz (73.3 kg).  Physical Exam  General: The patient is alert and cooperative at the time of the examination.  Skin: No significant peripheral edema is noted.   Neurologic Exam  Mental status: The patient is alert and oriented x 3 at the time of the examination. The Mini-Mental status examination done today shows a total score 27/30.   Cranial nerves: Facial symmetry is present. Speech is normal, no aphasia or dysarthria is noted. Extraocular movements are full. Visual fields are full.  Motor: The patient has good strength in all 4 extremities.  There is an AFO brace on the left.  Sensory examination: Soft touch sensation is symmetric on the face, arms, and legs.  Coordination: The patient has good  finger-nose-finger and heel-to-shin bilaterally.  Gait and station: The patient requires assistance with standing.  Once up, she is able to walk with a walker, otherwise her gait is wide-based, unsteady.  Reflexes: Deep tendon reflexes are symmetric.   Assessment/Plan:  1.  Cerebrovascular disease, left hemiparesis  2.  Memory disorder  The patient does have carotid and posterior circulation disease, she has decided that she does not wish to have a stent.  The patient will be switched to generic Namenda taking 10 mg twice daily.  She will remain on Aricept 10 mg daily.  Her weight has been stable even though she has not been eating much.  The patient will follow-up in about 8 or 9 months.  A prescription was sent in for the Aricept and Namenda.  Jill Alexanders MD 07/21/2017 4:22 PM  [facility] [address] [place], [place] [address]  Phone [phone] Fax [phone]

## 2017-07-22 LAB — BASIC METABOLIC PANEL
BUN / CREAT RATIO: 13 (ref 12–28)
BUN: 13 mg/dL (ref 8–27)
CO2: 27 mmol/L (ref 20–29)
Calcium: 9.9 mg/dL (ref 8.7–10.3)
Chloride: 101 mmol/L (ref 96–106)
Creatinine, Ser: 0.97 mg/dL (ref 0.57–1.00)
GFR calc Af Amer: 64 mL/min/{1.73_m2} (ref >59)
GFR calc non Af Amer: 55 mL/min/{1.73_m2} — ABNORMAL LOW (ref >59)
GLUCOSE: 100 mg/dL — AB (ref 65–99)
Potassium: 4.1 mmol/L (ref 3.5–5.2)
Sodium: 145 mmol/L — ABNORMAL HIGH (ref 134–144)

## 2017-07-27 ENCOUNTER — Other Ambulatory Visit: Payer: Self-pay | Admitting: Neurology

## 2017-07-31 ENCOUNTER — Ambulatory Visit: Payer: Medicare Other | Admitting: *Deleted

## 2017-07-31 DIAGNOSIS — Z5181 Encounter for therapeutic drug level monitoring: Secondary | ICD-10-CM

## 2017-07-31 DIAGNOSIS — I4891 Unspecified atrial fibrillation: Secondary | ICD-10-CM

## 2017-07-31 LAB — POCT INR: INR: 2.6 (ref 2.0–3.0)

## 2017-07-31 NOTE — Patient Instructions (Signed)
Description    Continue taking 1 tablet daily except 1.5 tablets on Tuesdays and Saturdays. Continue 1 serving each week of leafy green vegetables.  Recheck INR in  3 weeks  Call with any questions or problems [phone]

## 2017-08-03 ENCOUNTER — Ambulatory Visit: Payer: Medicare Other | Admitting: Interventional Cardiology

## 2017-08-25 ENCOUNTER — Ambulatory Visit: Payer: Medicare Other

## 2017-08-25 DIAGNOSIS — I4891 Unspecified atrial fibrillation: Secondary | ICD-10-CM | POA: Diagnosis not present

## 2017-08-25 DIAGNOSIS — Z5181 Encounter for therapeutic drug level monitoring: Secondary | ICD-10-CM | POA: Diagnosis not present

## 2017-08-25 LAB — POCT INR: INR: 1.4 — AB (ref 2.0–3.0)

## 2017-08-25 NOTE — Patient Instructions (Signed)
Description   Take 2 tablets today, then start taking 1 tablet daily except 1.5 tablets on Tuesdays, Thursdays, and Saturdays. Recheck INR in 2 weeks  Call with any questions or problems [phone]

## 2017-09-08 ENCOUNTER — Ambulatory Visit: Payer: Medicare Other

## 2017-09-08 DIAGNOSIS — Z5181 Encounter for therapeutic drug level monitoring: Secondary | ICD-10-CM

## 2017-09-08 DIAGNOSIS — I4891 Unspecified atrial fibrillation: Secondary | ICD-10-CM | POA: Diagnosis not present

## 2017-09-08 LAB — POCT INR: INR: 2.8 (ref 2.0–3.0)

## 2017-09-08 NOTE — Patient Instructions (Addendum)
Description   Continue on same dosage 1 tablet daily except 1.5 tablets on Tuesdays, Thursdays, and Saturdays. Recheck INR in 3 weeks  Call with any questions or problems [phone]

## 2017-09-29 ENCOUNTER — Ambulatory Visit: Payer: Medicare Other

## 2017-09-29 ENCOUNTER — Ambulatory Visit: Payer: Medicare Other | Admitting: Interventional Cardiology

## 2017-09-29 ENCOUNTER — Encounter: Payer: Self-pay | Admitting: Interventional Cardiology

## 2017-09-29 VITALS — BP 142/68 | HR 76 | Ht 65.0 in | Wt 162.0 lb

## 2017-09-29 DIAGNOSIS — I482 Chronic atrial fibrillation, unspecified: Secondary | ICD-10-CM

## 2017-09-29 DIAGNOSIS — I5032 Chronic diastolic (congestive) heart failure: Secondary | ICD-10-CM

## 2017-09-29 DIAGNOSIS — I4891 Unspecified atrial fibrillation: Secondary | ICD-10-CM

## 2017-09-29 DIAGNOSIS — I25709 Atherosclerosis of coronary artery bypass graft(s), unspecified, with unspecified angina pectoris: Secondary | ICD-10-CM | POA: Diagnosis not present

## 2017-09-29 DIAGNOSIS — Z5181 Encounter for therapeutic drug level monitoring: Secondary | ICD-10-CM | POA: Diagnosis not present

## 2017-09-29 DIAGNOSIS — E785 Hyperlipidemia, unspecified: Secondary | ICD-10-CM

## 2017-09-29 LAB — POCT INR: INR: 2.6 (ref 2.0–3.0)

## 2017-09-29 NOTE — Progress Notes (Signed)
Cardiology Office Note:    Date:  09/29/2017   ID:  Amber [name], DOB 06/26/1934, MRN [id number]  PCP:  Antony Contras, MD  Cardiologist:  Sinclair Grooms, MD   Referring MD: Antony Contras, MD   Chief Complaint  Patient presents with  . Congestive Heart Failure    History of Present Illness:    Amber [name] is a 83 y.o. female with a hx of coronary artery disease with prior CABG in 2000, chronic atrial fibrillation, chronic diastolic heart failure, chronic kidney disease, stage III, hyperlipidemia (statin intolerant), and essential hypertensionwith hypertensive heart disease.Also has cerebrovascular disease and dementia.   Improved.  Breathing is improved.  Appetite is improved.  No angina.  Denies orthopnea, PND, lower extremity swelling.  Daughter feels that her abdomen is increased in dimension.  Past Medical History:  Diagnosis Date  . Abnormality of gait 08/02/2014  . Cerebrovascular disease 05/21/2016  . Cervical spinal stenosis   . Chronic atrial fibrillation (HCC)    a. chronic/rate controlled;  b. chronic coumadin.  . Chronic diastolic CHF (congestive heart failure) ([place])    a. 02/2013 Echo: EF 55-60%, mild AI, mod dil LA.  . CKD (chronic kidney disease), stage III ([place])   . COPD with emphysema ([place])    PFT 05/02/10>>FEV1 1.35(62%), FEV1% 66, DLCO 75%  . Coronary artery disease    a. s/p CABG;  b. Abnormal nuc 2015 - managed medically.  . CVA (cerebral vascular accident) ([place])    left sided weakness  . Depression   . Fibromyalgia   . GERD (gastroesophageal reflux disease)    pepcid 2-3 times per week  . Gout   . Hemiparesis and alteration of sensations as late effects of stroke ([place]) 01/17/2015  . History of melanoma    squamous cell, melanoma  . Hyperlipidemia    a. statin intolerant, not felt to be candidate for PCSK9 due to chronically elevated CK levels.  . Hypertension   . Hypertensive heart disease   . Hypothyroidism   . Insomnia   . Memory change  01/23/2014  . Nasal polyposis   . Osteoarthritis   . Osteoporosis   . Pneumonia    1990  . Tobacco abuse     Past Surgical History:  Procedure Laterality Date  . ABDOMINAL HYSTERECTOMY    . BREAST LUMPECTOMY  1980s   Benign lesion - right  . carpel tunnel     right  . CORONARY ARTERY BYPASS GRAFT  2000  . EYE SURGERY     bilateral cataracts  . IR GENERIC HISTORICAL  03/31/2016   IR RADIOLOGIST EVAL & MGMT 03/31/2016 [facility]  . RADIOLOGY WITH ANESTHESIA  12/24/2011   Procedure: RADIOLOGY WITH ANESTHESIA;  Surgeon: Medication Radiologist, MD;  Location: [facility];  Service: Radiology;  Laterality: N/A;  Extra Cranial Vascular Stent  . RADIOLOGY WITH ANESTHESIA N/A 07/19/2014   Procedure: ANGIOPLASTY;  Surgeon: Luanne Bras, MD;  Location: [facility];  Service: Radiology;  Laterality: N/A;  . RADIOLOGY WITH ANESTHESIA N/A 07/27/2014   Procedure: ANGIOPLASTY;  Surgeon: Luanne Bras, MD;  Location: [place];  Service: Radiology;  Laterality: N/A;  . TONSILLECTOMY      Current Medications: Current Meds  Medication Sig  . allopurinol (ZYLOPRIM) 300 MG tablet Take 300 mg by mouth daily.  [name] amitriptyline (ELAVIL) 10 MG tablet TAKE 2 TABLETS BY MOUTH AT BEDTIME  . amLODipine (NORVASC) 5 MG tablet Take 1 tablet (5 mg total) by mouth at bedtime.  [name]  celecoxib (CELEBREX) 200 MG capsule Take 200 mg by mouth every morning.   . citalopram (CELEXA) 20 MG tablet TAKE 1 TABLET BY MOUTH TWICE A DAY  . clopidogrel (PLAVIX) 75 MG tablet Take 75 mg by mouth daily.  [name] COUMADIN 5 MG tablet TAKE AS DIRECTED BY COUMADIN CLINIC  . donepezil (ARICEPT) 10 MG tablet Take 10 mg by mouth every morning.  . ergocalciferol (VITAMIN D2) 50000 UNITS capsule Take 50,000 Units by mouth 2 (two) times a week. Tuesday and Friday  . furosemide (LASIX) 40 MG tablet Take 60 mg by mouth 2 (two) times daily.  [name] levothyroxine (SYNTHROID, LEVOTHROID) 25 MCG tablet Take 25 mcg by mouth every morning.   [name] losartan (COZAAR) 25  MG tablet Take 1 tablet (25 mg total) by mouth daily.  . memantine (NAMENDA) 10 MG tablet Take 1 tablet (10 mg total) by mouth 2 (two) times daily.  . metoprolol succinate (TOPROL-XL) 25 MG 24 hr tablet TAKE 1 TABLET (25 MG TOTAL) BY MOUTH DAILY.  . mometasone-formoterol (DULERA) 200-5 MCG/ACT AERO Inhale 2 puffs into the lungs 2 (two) times daily.  . nitroGLYCERIN (NITROSTAT) 0.4 MG SL tablet Place 1 tablet (0.4 mg total) under the tongue every 5 (five) minutes as needed for chest pain.  . potassium chloride SA (K-DUR,KLOR-CON) 20 MEQ tablet Take 20 mEq by mouth daily.  [name] PROAIR HFA 108 (90 BASE) MCG/ACT inhaler INHALE 2 PUFFS INTO LUNGS EVERY 6 HOURS AS NEEDED FOR WHEEZING  . Respiratory Therapy Supplies (FLUTTER) DEVI Use as directed.     Allergies:   Ezetimibe; Welchol [colesevelam hcl]; Adhesive [tape]; Ceclor [cefaclor]; Elastic bandages & [zinc]; Latex; Lipitor [atorvastatin calcium]; Mevacor [lovastatin]; Pravachol; and Vasotec   Social History   Socioeconomic History  . Marital status: Married    Spouse name: Not on file  . Number of children: 2  . Years of education: Not on file  . Highest education level: Not on file  Occupational History  . Occupation: Retired    Fish farm manager: [employer]  . 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 Financial resource strain: Not on file  . Food insecurity:    Worry: Not on file    Inability: Not on file  . Transportation needs:    Medical: Not on file    Non-medical: Not on file  Tobacco Use  . Smoking status: Former Smoker    Packs/day: 0.40    Years: 25.00    Pack years: 10.00    Types: Cigarettes    Last attempt to quit: 02/10/1977    Years since quitting: 40.6  . Smokeless tobacco: Never Used  Substance and Sexual Activity  . Alcohol use: No  . Drug use: No  . Sexual activity: Not on file  Lifestyle  . Physical activity:    Days per week: Not on file    Minutes per session: Not on file  . Stress:  Not on file  Relationships  . Social connections:    Talks on phone: Not on file    Gets together: Not on file    Attends religious service: Not on file    Active member of club or organization: Not on file    Attends meetings of clubs or organizations: Not on file    Relationship status: Not on file  Other Topics Concern  . Not on file  Social History Narrative   Patient is right handed.   Patient lives at home with husband  Patient drinks 8 oz caffeine occasionally.     Family History: The patient's family history includes CAD in her brother and father; Diabetes type II in her brother; Stroke in her mother.  ROS:   Please see the history of present illness.    Shortness of breath lying, easy bruising, shortness of breath with activity.  All other systems reviewed and are negative.  EKGs/Labs/Other Studies Reviewed:    The following studies were reviewed today: No data  EKG:  EKG is not ordered today.    Recent Labs: 03/04/2017: ALT 19 03/05/2017: Magnesium 2.0; TSH 2.045 03/10/2017: BNP 253.4; Hemoglobin 11.8; Platelets 422 06/23/2017: NT-Pro BNP 1,271 07/21/2017: BUN 13; Creatinine, Ser 0.97; Potassium 4.1; Sodium 145  Recent Lipid Panel    Component Value Date/Time   CHOL 228 (H) 03/06/2017 0410   TRIG 34 03/06/2017 0410   HDL 62 03/06/2017 0410   CHOLHDL 3.7 03/06/2017 0410   VLDL 7 03/06/2017 0410   LDLCALC 159 (H) 03/06/2017 0410   LDLDIRECT 236.0 08/02/2014 1418    Physical Exam:    VS:  BP (!) 142/68   Pulse 76   Ht 5\' 5"  (1.651 m)   Wt 162 lb (73.5 kg)   BMI 26.96 kg/m     Wt Readings from Last 3 Encounters:  09/29/17 162 lb (73.5 kg)  07/21/17 161 lb 8 oz (73.3 kg)  06/23/17 164 lb (74.4 kg)     GEN:  Well nourished, well developed in no acute distress HEENT: Normal NECK: No JVD. LYMPHATICS: No lymphadenopathy CARDIAC: IIRR, 2/6 systolic murmur, no gallop, no edema. VASCULAR: Trivial bilateral posterior tibial pulses.  No  bruits. RESPIRATORY:  Clear to auscultation without rales, wheezing or rhonchi  ABDOMEN: Soft, non-tender, non-distended, No pulsatile mass, MUSCULOSKELETAL: No deformity  SKIN: Warm and dry NEUROLOGIC:  Alert and oriented x 3 PSYCHIATRIC:  Normal affect   ASSESSMENT:    1. Coronary artery disease involving coronary bypass graft of native heart with angina pectoris ([place])   2. Chronic diastolic heart failure ([place])   3. Chronic atrial fibrillation ([place])   4. Dyslipidemia    PLAN:    In order of problems listed above:  1. Stable without angina 2. No evidence of volume overload.  CBC, BNP and BMET will be obtained today.  Follow-up in approximately 4 months.  Continue to weigh daily.  Notify us if significant variation in weight so that diuretic adjustment can be made. 3. Clinically, still in atrial fibrillation with controlled response. 4. Should aim for target LDL less than 70.  Overall doing well compared to earlier this year.  Diuretic regimen seems to be controlling volume.  Blood work today will help [place] to fine-tune therapy.   Medication Adjustments/Labs and Tests Ordered: Current medicines are reviewed at length with the patient today.  Concerns regarding medicines are outlined above.  No orders of the defined types were placed in this encounter.  No orders of the defined types were placed in this encounter.   There are no Patient Instructions on file for this visit.   Signed, Sinclair Grooms, MD  09/29/2017 3:46 PM    [place]

## 2017-09-29 NOTE — Patient Instructions (Signed)
Continue on same dosage 1 tablet daily except 1.5 tablets on Tuesdays, Thursdays, and Saturdays. Recheck INR in 4 weeks  Call with any questions or problems [phone]

## 2017-09-29 NOTE — Patient Instructions (Signed)
Medication Instructions:  Your physician recommends that you continue on your current medications as directed. Please refer to the Current Medication list given to you today.  Labwork: BMET, CBC and Pro BNP today  Testing/Procedures: None  Follow-Up: Your physician wants you to follow-up in: 4-6 months with Dr. Tamala Julian.  You will receive a reminder letter in the mail two months in advance. If you don't receive a letter, please call our office to schedule the follow-up appointment.   Any Other Special Instructions Will Be Listed Below (If Applicable).     If you need a refill on your cardiac medications before your next appointment, please call your pharmacy.

## 2017-09-30 LAB — BASIC METABOLIC PANEL
BUN/Creatinine Ratio: 12 (ref 12–28)
BUN: 13 mg/dL (ref 8–27)
CALCIUM: 9.3 mg/dL (ref 8.7–10.3)
CO2: 26 mmol/L (ref 20–29)
Chloride: 99 mmol/L (ref 96–106)
Creatinine, Ser: 1.05 mg/dL — ABNORMAL HIGH (ref 0.57–1.00)
GFR calc Af Amer: 58 mL/min/{1.73_m2} — ABNORMAL LOW (ref >59)
GFR calc non Af Amer: 50 mL/min/{1.73_m2} — ABNORMAL LOW (ref >59)
GLUCOSE: 118 mg/dL — AB (ref 65–99)
Potassium: 3.8 mmol/L (ref 3.5–5.2)
Sodium: 141 mmol/L (ref 134–144)

## 2017-09-30 LAB — CBC
HEMATOCRIT: 40 % (ref 34.0–46.6)
HEMOGLOBIN: 12.9 g/dL (ref 11.1–15.9)
MCH: 29.3 pg (ref 26.6–33.0)
MCHC: 32.3 g/dL (ref 31.5–35.7)
MCV: 91 fL (ref 79–97)
Platelets: 232 10*3/uL (ref 150–450)
RBC: 4.4 x10E6/uL (ref 3.77–5.28)
RDW: 16.5 % — ABNORMAL HIGH (ref 12.3–15.4)
WBC: 7.7 10*3/uL (ref 3.4–10.8)

## 2017-09-30 LAB — PRO B NATRIURETIC PEPTIDE: NT-Pro BNP: 1685 pg/mL — ABNORMAL HIGH (ref 0–738)

## 2017-10-27 ENCOUNTER — Ambulatory Visit: Payer: Medicare Other

## 2017-10-27 DIAGNOSIS — Z5181 Encounter for therapeutic drug level monitoring: Secondary | ICD-10-CM

## 2017-10-27 DIAGNOSIS — I4891 Unspecified atrial fibrillation: Secondary | ICD-10-CM

## 2017-10-27 LAB — POCT INR: INR: 2.7 (ref 2.0–3.0)

## 2017-10-27 NOTE — Patient Instructions (Signed)
Continue on same dosage 1 tablet daily except 1.5 tablets on Tuesdays, Thursdays, and Saturdays. Recheck INR in 5 weeks  Call with any questions or problems [phone]

## 2017-10-30 ENCOUNTER — Other Ambulatory Visit: Payer: Self-pay | Admitting: Interventional Cardiology

## 2017-10-30 ENCOUNTER — Other Ambulatory Visit: Payer: Self-pay | Admitting: Pharmacist

## 2017-10-30 MED ORDER — COUMADIN 5 MG PO TABS: ORAL_TABLET | ORAL | 1 refills | Status: DC

## 2017-11-19 ENCOUNTER — Other Ambulatory Visit: Payer: Self-pay | Admitting: Interventional Cardiology

## 2017-12-02 ENCOUNTER — Ambulatory Visit: Payer: Medicare Other | Admitting: *Deleted

## 2017-12-02 ENCOUNTER — Other Ambulatory Visit: Payer: Self-pay | Admitting: *Deleted

## 2017-12-02 DIAGNOSIS — Z5181 Encounter for therapeutic drug level monitoring: Secondary | ICD-10-CM | POA: Diagnosis not present

## 2017-12-02 DIAGNOSIS — I4891 Unspecified atrial fibrillation: Secondary | ICD-10-CM | POA: Diagnosis not present

## 2017-12-02 LAB — POCT INR: INR: 2.7 (ref 2.0–3.0)

## 2017-12-02 NOTE — Patient Instructions (Signed)
Description   Continue on same dosage 1 tablet daily except 1.5 tablets on Tuesdays, Thursdays, and Saturdays. Recheck INR in 6 weeks  Call with any questions or problems [phone]

## 2017-12-03 MED ORDER — POTASSIUM CHLORIDE CRYS ER 20 MEQ PO TBCR: 20.0000 meq | EXTENDED_RELEASE_TABLET | Freq: Every day | ORAL | 2 refills | Status: DC

## 2017-12-03 NOTE — Telephone Encounter (Signed)
Ok to fill Potassium

## 2017-12-15 ENCOUNTER — Other Ambulatory Visit: Payer: Self-pay | Admitting: Nurse Practitioner

## 2017-12-17 ENCOUNTER — Telehealth: Payer: Self-pay | Admitting: Interventional Cardiology

## 2017-12-17 NOTE — Telephone Encounter (Signed)
Spoke with daughter [name], [place] on file.  Asked about weight loss to make sure it wasn't d/t not eating or something along those lines.  Daughter states pt is fine and actually doing really well.  Advised her to continue to monitor and give [place] a call if any concerns.  Daughter appreciative for call.

## 2017-12-17 NOTE — Telephone Encounter (Signed)
  Amber [name] is reporting Ms Micucci has had 2.6 pound weight loss in the last 7 days and is not experiencing any other symptoms

## 2018-01-06 ENCOUNTER — Ambulatory Visit (INDEPENDENT_AMBULATORY_CARE_PROVIDER_SITE_OTHER): Payer: Medicare Other | Admitting: *Deleted

## 2018-01-06 DIAGNOSIS — I4891 Unspecified atrial fibrillation: Secondary | ICD-10-CM

## 2018-01-06 DIAGNOSIS — Z5181 Encounter for therapeutic drug level monitoring: Secondary | ICD-10-CM | POA: Diagnosis not present

## 2018-01-06 LAB — POCT INR: INR: 4.3 — AB (ref 2.0–3.0)

## 2018-01-06 NOTE — Patient Instructions (Signed)
Description   Skip today's dose, tomorrow only take 1 tablet  then Continue on same dosage 1 tablet daily except 1.5 tablets on Tuesdays, Thursdays, and Saturdays. Recheck INR in 2 weeks  Call with any questions or problems [phone] to [facility], Main # [phone].

## 2018-01-20 ENCOUNTER — Ambulatory Visit (INDEPENDENT_AMBULATORY_CARE_PROVIDER_SITE_OTHER): Payer: Medicare Other | Admitting: *Deleted

## 2018-01-20 DIAGNOSIS — I4891 Unspecified atrial fibrillation: Secondary | ICD-10-CM

## 2018-01-20 DIAGNOSIS — Z5181 Encounter for therapeutic drug level monitoring: Secondary | ICD-10-CM | POA: Diagnosis not present

## 2018-01-20 LAB — POCT INR: INR: 2.8 (ref 2.0–3.0)

## 2018-01-20 NOTE — Patient Instructions (Signed)
Description   Continue on same dosage 1 tablet daily except 1.5 tablets on Tuesdays, Thursdays, and Saturdays. Recheck INR in 3 weeks  Call with any questions or problems [phone] to [facility], Main # [phone].

## 2018-01-28 ENCOUNTER — Telehealth: Payer: Self-pay | Admitting: Interventional Cardiology

## 2018-01-28 NOTE — Telephone Encounter (Signed)
Follow up     Pt daughter returning a call to the nurse

## 2018-01-28 NOTE — Telephone Encounter (Signed)
DPR on file that says we can leave detailed message on the number the daughter provided for call back.  Daughter listed on DPR as well.  Left message letting her know that we have pt down to take K+ 70mEq QD.  Advised when labs have been checked K+ has been fine.  Advised to call back with any questions.

## 2018-01-28 NOTE — Telephone Encounter (Signed)
Spoke with daughter, [name], and she states that since pt was seen in August she has been alternating giving pt 1 K+ tablet (20mEq) one day and giving 2 tablets (40 mEq) the next day.  Pt almost out of tablets and pharmacy will not fill because she shouldn't be out yet because they are prescribed one tab daily.  Daughter concerned that since pt takes Furosemide 120mg  daily that one tablet of K+ a day isn't enough.  Last BMET was 09/2017 and K+ was 3.8.  Pt seeing PCP on Monday and daughter will have them check another BMET and send it to our office.  Daughter would like to know if Dr. Tamala Julian willing to prescribe K+ 20 mEq- take one tablet every other day, alternating with taking 2 tablets on the other days?

## 2018-01-28 NOTE — Telephone Encounter (Signed)
New message    Pt daughter is calling with questions about medication.    Pt c/o medication issue:  1. Name of Medication: potassium  2. How are you currently taking this medication (dosage and times per day)?   3. Are you having a reaction (difficulty breathing--STAT)?   4. What is your medication issue? Pt daughter said that pt takes 120mg  of lasix and she said she needs to know the dose for the potassium

## 2018-01-29 NOTE — Telephone Encounter (Signed)
Spoke with daughter and made her aware that we need to see K+ level first.  Daughter states pt has appt on Monday with PCP and she will have them draw the labs and send them over to our office.  Advised once received we will send to Dr. Tamala Julian for review.

## 2018-01-29 NOTE — Telephone Encounter (Signed)
Need to see what potassium is first.

## 2018-02-08 ENCOUNTER — Telehealth: Payer: Self-pay | Admitting: Interventional Cardiology

## 2018-02-08 ENCOUNTER — Other Ambulatory Visit: Payer: Self-pay

## 2018-02-08 MED ORDER — POTASSIUM CHLORIDE CRYS ER 20 MEQ PO TBCR: 20.0000 meq | EXTENDED_RELEASE_TABLET | Freq: Every day | ORAL | 2 refills | Status: DC

## 2018-02-08 NOTE — Telephone Encounter (Signed)
New Message   Daughter is calling inquiring about lab results. Please call

## 2018-02-08 NOTE — Telephone Encounter (Signed)
Tried to call Dr. Laqueta Linden office, was on hold for over 10 mins.  Checked [facility] web site and pt had labs drawn 02/01/18.  Creatinine was 0.94 and K+ was 4.1.  Will route to Dr. Tamala Julian for review.

## 2018-02-08 NOTE — Telephone Encounter (Signed)
Spoke with daughter and made her aware that I obtained labs from [facility] site and have sent them over to Dr. Tamala Julian.  Advised as soon as I hear back from him I would call with his recommendations.

## 2018-02-08 NOTE — Telephone Encounter (Signed)
New Message    *STAT* If patient is at the pharmacy, call can be transferred to refill team.   1. Which medications need to be refilled? (please list name of each medication and dose if known) potassium chloride SA (K-DUR,KLOR-CON) 20 MEQ tablet     2. Which pharmacy/location (including street and city if local pharmacy) is medication to be sent to? [facility] [address] [place]   3. Do they need a 30 day or 90 day supply? [place]

## 2018-02-09 MED ORDER — FUROSEMIDE 40 MG PO TABS: ORAL_TABLET | ORAL | 1 refills | Status: DC

## 2018-02-09 MED ORDER — POTASSIUM CHLORIDE CRYS ER 20 MEQ PO TBCR: EXTENDED_RELEASE_TABLET | ORAL | 1 refills | Status: DC

## 2018-02-09 NOTE — Telephone Encounter (Signed)
Spoke with daughter and made her aware Dr. Tamala Julian said ok to continue current regimen.  Advised I would send in new prescription.  Daughter appreciative for call.

## 2018-02-09 NOTE — Telephone Encounter (Signed)
Based upon the laboratory data, current potassium doses adequate.  Continue same therapy as outlined in the note below.

## 2018-02-09 NOTE — Addendum Note (Signed)
Addended by: Loren Racer on: 02/09/2018 11:48 AM   Modules accepted: Orders

## 2018-02-17 ENCOUNTER — Ambulatory Visit: Payer: Medicare Other | Admitting: *Deleted

## 2018-02-17 DIAGNOSIS — I4891 Unspecified atrial fibrillation: Secondary | ICD-10-CM | POA: Diagnosis not present

## 2018-02-17 DIAGNOSIS — Z5181 Encounter for therapeutic drug level monitoring: Secondary | ICD-10-CM

## 2018-02-17 LAB — POCT INR: INR: 4.2 — AB (ref 2.0–3.0)

## 2018-02-17 NOTE — Patient Instructions (Signed)
Description   Do not take any Coumadin today and take 1 tablet tomorrow then continue on same dosage 1 tablet daily except 1.5 tablets on Tuesdays, Thursdays, and Saturdays. Recheck INR in 2 weeks  Call with any questions or problems [phone] to Coumadin Clinic, Main # [phone].

## 2018-02-23 NOTE — Progress Notes (Deleted)
Cardiology Office Note:    Date:  02/23/2018   ID:  Amber [name], DOB February 03, 1935, MRN [id number]  PCP:  Antony Contras, MD  Cardiologist:  Sinclair Grooms, MD   Referring MD: Antony Contras, MD   No chief complaint on file.   History of Present Illness:    Amber [name] is a 84 y.o. female with a hx of coronary artery disease with prior CABG in 2000, chronic atrial fibrillation, chronic diastolic heart failure, chronic kidney disease, stage III, hyperlipidemia (statin intolerant), and essential hypertensionwith hypertensive heart disease.Also has cerebrovascular disease and dementia.  Past Medical History:  Diagnosis Date  . Abnormality of gait 08/02/2014  . Cerebrovascular disease 05/21/2016  . Cervical spinal stenosis   . Chronic atrial fibrillation (HCC)    a. chronic/rate controlled;  b. chronic coumadin.  . Chronic diastolic CHF (congestive heart failure) ([place])    a. 02/2013 Echo: EF 55-60%, mild AI, mod dil LA.  . CKD (chronic kidney disease), stage III ([place])   . COPD with emphysema ([place])    PFT 05/02/10>>FEV1 1.35(62%), FEV1% 66, DLCO 75%  . Coronary artery disease    a. s/p CABG;  b. Abnormal nuc 2015 - managed medically.  . CVA (cerebral vascular accident) ([place])    left sided weakness  . Depression   . Fibromyalgia   . GERD (gastroesophageal reflux disease)    pepcid 2-3 times per week  . Gout   . Hemiparesis and alteration of sensations as late effects of stroke ([place]) 01/17/2015  . History of melanoma    squamous cell, melanoma  . Hyperlipidemia    a. statin intolerant, not felt to be candidate for PCSK9 due to chronically elevated CK levels.  . Hypertension   . Hypertensive heart disease   . Hypothyroidism   . Insomnia   . Memory change 01/23/2014  . Nasal polyposis   . Osteoarthritis   . Osteoporosis   . Pneumonia    1990  . Tobacco abuse     Past Surgical History:  Procedure Laterality Date  . ABDOMINAL HYSTERECTOMY    . BREAST LUMPECTOMY   1980s   Benign lesion - right  . carpel tunnel     right  . CORONARY ARTERY BYPASS GRAFT  2000  . EYE SURGERY     bilateral cataracts  . IR GENERIC HISTORICAL  03/31/2016   IR RADIOLOGIST EVAL & MGMT 03/31/2016 [facility]  . RADIOLOGY WITH ANESTHESIA  12/24/2011   Procedure: RADIOLOGY WITH ANESTHESIA;  Surgeon: Medication Radiologist, MD;  Location: [facility];  Service: Radiology;  Laterality: N/A;  Extra Cranial Vascular Stent  . RADIOLOGY WITH ANESTHESIA N/A 07/19/2014   Procedure: ANGIOPLASTY;  Surgeon: Luanne Bras, MD;  Location: [facility];  Service: Radiology;  Laterality: N/A;  . RADIOLOGY WITH ANESTHESIA N/A 07/27/2014   Procedure: ANGIOPLASTY;  Surgeon: Luanne Bras, MD;  Location: [facility];  Service: Radiology;  Laterality: N/A;  . TONSILLECTOMY      Current Medications: No outpatient medications have been marked as taking for the 02/24/18 encounter (Appointment) with Belva Crome, MD.     Allergies:   Ezetimibe; Welchol [colesevelam hcl]; Adhesive [tape]; Ceclor [cefaclor]; Elastic bandages & [zinc]; Latex; Lipitor [atorvastatin calcium]; Mevacor [lovastatin]; Pravachol; and Vasotec   Social History   Socioeconomic History  . Marital status: Married    Spouse name: Not on file  . Number of children: 2  . Years of education: Not on file  . Highest education level: Not on  file  Occupational History  . Occupation: Retired    Fish farm manager: [employer]  . 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 Financial resource strain: Not on file  . Food insecurity:    Worry: Not on file    Inability: Not on file  . Transportation needs:    Medical: Not on file    Non-medical: Not on file  Tobacco Use  . Smoking status: Former Smoker    Packs/day: 0.40    Years: 25.00    Pack years: 10.00    Types: Cigarettes    Last attempt to quit: 02/10/1977    Years since quitting: 41.0  . Smokeless tobacco: Never Used  Substance and Sexual Activity  . Alcohol  use: No  . Drug use: No  . Sexual activity: Not on file  Lifestyle  . Physical activity:    Days per week: Not on file    Minutes per session: Not on file  . Stress: Not on file  Relationships  . Social connections:    Talks on phone: Not on file    Gets together: Not on file    Attends religious service: Not on file    Active member of club or organization: Not on file    Attends meetings of clubs or organizations: Not on file    Relationship status: Not on file  Other Topics Concern  . Not on file  Social History Narrative   Patient is right handed.   Patient lives at home with husband   Patient drinks 8 oz caffeine occasionally.     Family History: The patient's family history includes CAD in her brother and father; Diabetes type II in her brother; Stroke in her mother.  ROS:   Please see the history of present illness.    *** All other systems reviewed and are negative.  EKGs/Labs/Other Studies Reviewed:    The following studies were reviewed today: ***  EKG:  EKG is ***  Recent Labs: 03/04/2017: ALT 19 03/05/2017: Magnesium 2.0; TSH 2.045 03/10/2017: BNP 253.4 09/29/2017: BUN 13; Creatinine, Ser 1.05; Hemoglobin 12.9; NT-Pro BNP 1,685; Platelets 232; Potassium 3.8; Sodium 141  Recent Lipid Panel    Component Value Date/Time   CHOL 228 (H) 03/06/2017 0410   TRIG 34 03/06/2017 0410   HDL 62 03/06/2017 0410   CHOLHDL 3.7 03/06/2017 0410   VLDL 7 03/06/2017 0410   LDLCALC 159 (H) 03/06/2017 0410   LDLDIRECT 236.0 08/02/2014 1418    Physical Exam:    VS:  There were no vitals taken for this visit.    Wt Readings from Last 3 Encounters:  09/29/17 162 lb (73.5 kg)  07/21/17 161 lb 8 oz (73.3 kg)  06/23/17 164 lb (74.4 kg)     GEN: ***. No acute distress HEENT: Normal NECK: No JVD. LYMPHATICS: No lymphadenopathy CARDIAC: ***RRR.  *** murmur, gallop, edema VASCULAR: Pulses ***, Bruits *** RESPIRATORY:  Clear to auscultation without rales, wheezing or  rhonchi  ABDOMEN: Soft, non-tender, non-distended, No pulsatile mass, MUSCULOSKELETAL: No deformity  SKIN: Warm and dry NEUROLOGIC:  Alert and oriented x 3 PSYCHIATRIC:  Normal affect   ASSESSMENT:    1. Chronic diastolic heart failure ([place])   2. Chronic atrial fibrillation   3. Coronary artery disease involving coronary bypass graft of native heart with angina pectoris ([place])   4. Essential hypertension   5. Long term current use of anticoagulant therapy   6. Dyslipidemia    PLAN:  In order of problems listed above:  1. ***   Medication Adjustments/Labs and Tests Ordered: Current medicines are reviewed at length with the patient today.  Concerns regarding medicines are outlined above.  No orders of the defined types were placed in this encounter.  No orders of the defined types were placed in this encounter.   There are no Patient Instructions on file for this visit.   Signed, Sinclair Grooms, MD  02/23/2018 1:47 PM    [facility]

## 2018-02-24 ENCOUNTER — Ambulatory Visit: Payer: Medicare Other | Admitting: Interventional Cardiology

## 2018-03-03 ENCOUNTER — Ambulatory Visit: Payer: Medicare Other | Admitting: *Deleted

## 2018-03-03 DIAGNOSIS — I4891 Unspecified atrial fibrillation: Secondary | ICD-10-CM | POA: Diagnosis not present

## 2018-03-03 DIAGNOSIS — Z5181 Encounter for therapeutic drug level monitoring: Secondary | ICD-10-CM

## 2018-03-03 LAB — POCT INR: INR: 3.9 — AB (ref 2.0–3.0)

## 2018-03-03 NOTE — Patient Instructions (Signed)
Description   Do not take any Coumadin today then start taking 1 tablet daily except 1.5 tablets on Tuesdays and Saturdays. Recheck INR in 2 weeks  Call with any questions or problems [phone] to [facility]in Clinic, Main # [phone].

## 2018-03-26 ENCOUNTER — Ambulatory Visit: Payer: Medicare Other | Admitting: Pharmacist

## 2018-03-26 DIAGNOSIS — Z5181 Encounter for therapeutic drug level monitoring: Secondary | ICD-10-CM | POA: Diagnosis not present

## 2018-03-26 DIAGNOSIS — I4891 Unspecified atrial fibrillation: Secondary | ICD-10-CM

## 2018-03-26 LAB — POCT INR: INR: 2.7 (ref 2.0–3.0)

## 2018-03-26 NOTE — Patient Instructions (Signed)
Description   Continue 1 tablet daily except 1.5 tablets on Tuesdays and Saturdays. Recheck INR in 3 weeks  Call with any questions or problems [phone] to [facility] Clinic, Main # [phone].

## 2018-04-18 ENCOUNTER — Other Ambulatory Visit: Payer: Self-pay | Admitting: Interventional Cardiology

## 2018-04-20 ENCOUNTER — Ambulatory Visit: Payer: Medicare Other | Admitting: Neurology

## 2018-04-20 ENCOUNTER — Telehealth: Payer: Self-pay | Admitting: Neurology

## 2018-04-20 NOTE — Telephone Encounter (Signed)
This patient did not show for a revisit appointment today. 

## 2018-04-21 ENCOUNTER — Encounter: Payer: Self-pay | Admitting: Neurology

## 2018-04-30 ENCOUNTER — Telehealth: Payer: Self-pay | Admitting: *Deleted

## 2018-04-30 NOTE — Telephone Encounter (Signed)
Patients daughter [name] called and stated that the pt will be receiving Home Health via PCP and she is unsure of the date. If she is going to get Home Health we can call and get her INR checked at that time. Advised her to call back when the Physical Therapist calls them to set this up or give them our number.

## 2018-05-02 ENCOUNTER — Other Ambulatory Visit: Payer: Self-pay | Admitting: Interventional Cardiology

## 2018-05-03 NOTE — Telephone Encounter (Signed)
Spoke with Amy physical therapist and gave her a verbal order to check INR the next time they are in the home (said it will be this week)

## 2018-05-04 ENCOUNTER — Telehealth: Payer: Self-pay | Admitting: Interventional Cardiology

## 2018-05-04 NOTE — Telephone Encounter (Signed)
Spoke with pt's daughter, [name], [place] on file, in regards to appt scheduled for 05/13/2018 with Dr. Tamala Julian.  Daughter denies any cardiac issues.  Rescheduled pt for 08/31/2018.  Advised to contact the office should any issues arise prior to appt.

## 2018-05-06 ENCOUNTER — Ambulatory Visit (INDEPENDENT_AMBULATORY_CARE_PROVIDER_SITE_OTHER): Payer: Medicare Other | Admitting: Pharmacist

## 2018-05-06 DIAGNOSIS — Z5181 Encounter for therapeutic drug level monitoring: Secondary | ICD-10-CM | POA: Diagnosis not present

## 2018-05-06 LAB — POCT INR: INR: 2.8 (ref 2.0–3.0)

## 2018-05-13 ENCOUNTER — Ambulatory Visit: Payer: Medicare Other | Admitting: Interventional Cardiology

## 2018-05-28 ENCOUNTER — Other Ambulatory Visit: Payer: Self-pay | Admitting: Interventional Cardiology

## 2018-06-01 ENCOUNTER — Telehealth: Payer: Self-pay | Admitting: *Deleted

## 2018-06-01 NOTE — Telephone Encounter (Signed)

## 2018-06-01 NOTE — Telephone Encounter (Signed)
Daughter [name] left a message stating that she needs to know when the pt appt is. Called her back and had to leave a message. Pt is due this week, so left a message to [facility] back at [phone] and gave her operating times for this week. Will await a call back from her to schedule an appt.

## 2018-06-07 ENCOUNTER — Telehealth: Payer: Self-pay | Admitting: Neurology

## 2018-06-07 NOTE — Telephone Encounter (Signed)
Noted, chart will be updated closer to appt.

## 2018-06-07 NOTE — Telephone Encounter (Signed)
Pt called in to cancel appt scheduled for 06/09/2018 , I advised pt she didn't have to cancel we can scheduled her for a Televisit or a Virtual web visit, pt gave consent for [employer]

## 2018-06-08 NOTE — Telephone Encounter (Signed)
I contacted the pt's daughter [name] (ok per dpr) and updated the pt's meds, allergies and PMH.  Daughter consented to for telephone visit to be converted to doxy.me video visit.   Text link has been submitted to [phone].

## 2018-06-09 ENCOUNTER — Encounter: Payer: Self-pay | Admitting: Neurology

## 2018-06-09 ENCOUNTER — Ambulatory Visit (INDEPENDENT_AMBULATORY_CARE_PROVIDER_SITE_OTHER): Payer: Medicare Other | Admitting: Neurology

## 2018-06-09 ENCOUNTER — Other Ambulatory Visit: Payer: Self-pay

## 2018-06-09 ENCOUNTER — Telehealth: Payer: Self-pay

## 2018-06-09 DIAGNOSIS — F0390 Unspecified dementia without behavioral disturbance: Secondary | ICD-10-CM | POA: Diagnosis not present

## 2018-06-09 DIAGNOSIS — I679 Cerebrovascular disease, unspecified: Secondary | ICD-10-CM | POA: Diagnosis not present

## 2018-06-09 DIAGNOSIS — I69359 Hemiplegia and hemiparesis following cerebral infarction affecting unspecified side: Secondary | ICD-10-CM | POA: Diagnosis not present

## 2018-06-09 DIAGNOSIS — I69398 Other sequelae of cerebral infarction: Secondary | ICD-10-CM | POA: Diagnosis not present

## 2018-06-09 MED ORDER — MEMANTINE HCL 10 MG PO TABS: 10.0000 mg | ORAL_TABLET | Freq: Two times a day (BID) | ORAL | 3 refills | Status: DC

## 2018-06-09 MED ORDER — DONEPEZIL HCL 10 MG PO TABS: 10.0000 mg | ORAL_TABLET | Freq: Every day | ORAL | 3 refills | Status: DC

## 2018-06-09 NOTE — Progress Notes (Signed)
Virtual Visit via Telephone Note  I connected with Amber [name] on 06/09/18 at  9:00 AM EDT by telephone and verified that I am speaking with the correct person using two identifiers.   I discussed the limitations, risks, security and privacy concerns of performing an evaluation and management service by telephone and the availability of in person appointments. I also discussed with the patient that there may be a patient responsible charge related to this service. The patient expressed understanding and agreed to proceed.  The patient is at home, physician in the office.   History of Present Illness: Amber [name] is an 84 year old right-handed white female with a history of cerebrovascular disease with a left hemiparesis.  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  She lives at home with her husband.  She requires assistance keeping up with medications and appointments and her husband does the finances.  The patient does not operate a motor vehicle.  The patient is on amitriptyline taking 20 mg at night, the family has not been able to fully taper her off the medication.  She will sleep throughout the night, going to bed around 8 PM, she will sleep around noon and then get up and have lunch or breakfast, and then around 1 or 2 go back and take a nap again, she only stays awake for few hours during the day.  The patient has started to have hallucinations, she will see people in the yard, this does not result in agitation, the patient seem to understand that the hallucinations are not real.  The patient is having some loose stools on the Aricept, no overt diarrhea is noted.  She has been getting Imodium for the loose stools.  The patient is on Namenda as well.  The patient is having some progression of  memory, she is disoriented at times, she will repeat herself, she may have trouble with names for people.  She has not had any falls, she was sometimes bumped into the wall however.  She does minimal physical activity during the day.   Observations/Objective: Video evaluation reveals that the patient is alert and will cooperate.  The patient is able to follow commands, she has full extraocular movements.  She can protrude the tongue in the midline with good lateral tongue movement.  She has good finger-nose-finger bilaterally.  She has a normal speech pattern, no aphasia or dysarthria noted.  The patient is able to get up out of the chair and walk short distance with a walker, she has good stability with a walker.  Romberg is negative.  Tandem gait was not attempted.  The [facility]-blind evaluation reveals that total score of 9/22.  Assessment and Plan: 1.  Cerebrovascular disease, left hemiparesis  2.  Gait disorder  3.  Memory disorder  The patient is gradually progressing with her memory over time.  She is having some mobility issues that appear to be primarily related to a dystonic posturing of the left foot related to the prior stroke.  I will get her back here in about 3 months to evaluate this, if there has not been a contracture of the foot, she may be a candidate for Botox therapy.  The patient will continue the Aricept and Namenda, these prescriptions were sent in.  I have asked the family to try to taper her off of the amitriptyline going to 10 mg at night for about 6 weeks and then try stopping the medication.  The patient is having some hallucinations, and she is sleeping most of the day, getting her off of the amitriptyline may help this.  Follow Up Instructions: 18-month follow-up with me.   I discussed the assessment and treatment plan with the patient. The patient was provided an opportunity to ask questions and all were answered. The patient agreed with the plan and demonstrated an  understanding of the instructions.   The patient was advised to call back or seek an in-person evaluation if the symptoms worsen or if the condition fails to improve as anticipated.  I provided 30 minutes of non-face-to-face time during this encounter.   Kathrynn Ducking, MD

## 2018-06-09 NOTE — Telephone Encounter (Signed)
lmom for prescreen  

## 2018-06-10 ENCOUNTER — Ambulatory Visit (INDEPENDENT_AMBULATORY_CARE_PROVIDER_SITE_OTHER): Payer: Medicare Other

## 2018-06-10 ENCOUNTER — Other Ambulatory Visit: Payer: Self-pay

## 2018-06-10 DIAGNOSIS — Z5181 Encounter for therapeutic drug level monitoring: Secondary | ICD-10-CM

## 2018-06-10 DIAGNOSIS — I4891 Unspecified atrial fibrillation: Secondary | ICD-10-CM

## 2018-06-10 LAB — POCT INR: INR: 3.5 — AB (ref 2.0–3.0)

## 2018-06-10 NOTE — Patient Instructions (Signed)
Description   Called spoke with [name] pt's daughter, advised to have pt, skip today's dosage of Coumadin, then start taking 1 tablet daily except 1.5 tablets on Tuesdays. Recheck INR in 3 weeks.  Call with any questions or problems [phone] to [facility], Main # [phone].

## 2018-06-28 ENCOUNTER — Other Ambulatory Visit: Payer: Self-pay | Admitting: Interventional Cardiology

## 2018-06-28 ENCOUNTER — Other Ambulatory Visit: Payer: Self-pay | Admitting: Neurology

## 2018-06-30 ENCOUNTER — Telehealth: Payer: Self-pay

## 2018-06-30 NOTE — Telephone Encounter (Signed)

## 2018-07-07 ENCOUNTER — Telehealth: Payer: Self-pay

## 2018-07-07 NOTE — Telephone Encounter (Signed)
1. COVID-19 Pre-Screening Questions:  . In the past 7 to 10 days have you had a cough,  shortness of breath, headache, congestion, fever (100 or greater) body aches, chills, sore throat, or sudden loss of taste or sense of smell?NO . Have you been around anyone with known Covid 19.NO . Have you been around anyone who is awaiting Covid 19 test results in the past 7 to 10 days?NO . Have you been around anyone who has been exposed to Covid 19, or has mentioned symptoms of Covid 19 within the past 7 to 10 days?NO   2. Pt advised of visitor restrictions (no visitors allowed except if needed to conduct the visit). Also advised to arrive at appointment time and wear a mask. YES

## 2018-07-08 ENCOUNTER — Other Ambulatory Visit: Payer: Self-pay

## 2018-07-08 ENCOUNTER — Ambulatory Visit (INDEPENDENT_AMBULATORY_CARE_PROVIDER_SITE_OTHER): Payer: Medicare Other

## 2018-07-08 DIAGNOSIS — Z5181 Encounter for therapeutic drug level monitoring: Secondary | ICD-10-CM | POA: Diagnosis not present

## 2018-07-08 DIAGNOSIS — I4891 Unspecified atrial fibrillation: Secondary | ICD-10-CM | POA: Diagnosis not present

## 2018-07-08 LAB — POCT INR: INR: 2.1 (ref 2.0–3.0)

## 2018-07-08 NOTE — Patient Instructions (Signed)
Description   Called spoke with [name] pt's daughter, advised to have pt continue on same dosage 1 tablet daily except 1.5 tablets on Tuesdays. Recheck INR in 4 weeks.  Call with any questions or problems [phone] to [facility], Main # [phone].

## 2018-07-12 ENCOUNTER — Other Ambulatory Visit: Payer: Self-pay | Admitting: Interventional Cardiology

## 2018-07-29 ENCOUNTER — Telehealth: Payer: Self-pay

## 2018-07-29 NOTE — Telephone Encounter (Signed)

## 2018-07-30 ENCOUNTER — Other Ambulatory Visit: Payer: Self-pay | Admitting: Neurology

## 2018-08-02 ENCOUNTER — Ambulatory Visit: Payer: Medicare Other | Admitting: Podiatry

## 2018-08-06 ENCOUNTER — Telehealth: Payer: Self-pay | Admitting: *Deleted

## 2018-08-06 ENCOUNTER — Other Ambulatory Visit: Payer: Self-pay | Admitting: Interventional Cardiology

## 2018-08-06 NOTE — Telephone Encounter (Signed)
Daughter called and stated she forget to bring the pt to her appt yesterday. Advised that we can reschedule and made her an appt for next available on 08/12/2018 and completed the prescreening COVID 19 questions. She is aware the appt is inside and not drive up and [t will need to come in the building for appt. Dtr states pt uses a walker and needs assistance. Advised to use a wheelchair since she needs assistance and advised they are on the first floor and she can put her in it and assist her to 3rd floor and that we are limiting visitors since pt is able to speak for herself. She verbalized understanding.  1. COVID-19 Pre-Screening Questions:  . In the past 7 to 10 days have you had a cough,  shortness of breath, headache, congestion, fever (100 or greater) body aches, chills, sore throat, or sudden loss of taste or sense of smell? No . Have you been around anyone with known Covid 19? No . Have you been around anyone who is awaiting Covid 19 test results in the past 7 to 10 days? No . Have you been around anyone who has been exposed to Covid 19, or has mentioned symptoms of Covid 19 within the past 7 to 10 days? No    2. Pt advised of visitor restrictions (no visitors allowed except if needed to conduct the visit). Also advised to arrive at appointment time and wear a mask.

## 2018-08-12 ENCOUNTER — Telehealth: Payer: Self-pay | Admitting: *Deleted

## 2018-08-12 NOTE — Telephone Encounter (Signed)
Dtr called to reschedule pt's appt because she could not bring her today. Advised that pt needs to be seen as her last visit was on 07/08/2018 and was due on 08/05/2018; rescheduled her appt and advised to be on time for appt and she verbalized understanding. She was advised to be on time and no more than 15 minutes early and not late for appt.   1. COVID-19 Pre-Screening Questions:  . In the past 7 to 10 days have you had a cough,  shortness of breath, headache, congestion, fever (100 or greater) body aches, chills, sore throat, or sudden loss of taste or sense of smell? No . Have you been around anyone with known Covid 19? No . Have you been around anyone who is awaiting Covid 19 test results in the past 7 to 10 days? No . Have you been around anyone who has been exposed to Covid 19, or has mentioned symptoms of Covid 19 within the past 7 to 10 days? No    2. Pt advised of visitor restrictions (no visitors allowed except if needed to conduct the visit). Daughter to Accompany pt.  Also advised to arrive at appointment time and wear a mask.

## 2018-08-16 ENCOUNTER — Telehealth: Payer: Self-pay

## 2018-08-16 NOTE — Telephone Encounter (Signed)

## 2018-08-30 ENCOUNTER — Telehealth: Payer: Self-pay | Admitting: Interventional Cardiology

## 2018-08-30 NOTE — Telephone Encounter (Signed)
New Message        COVID-19 Pre-Screening Questions:   In the past 7 to 10 days have you had a cough,  shortness of breath, headache, congestion, fever (100 or greater) body aches, chills, sore throat, or sudden loss of taste or sense of smell? NO  Have you been around anyone with known Covid 19. NO  Have you been around anyone who is awaiting Covid 19 test results in the past 7 to 10 days? NO  Have you been around anyone who has been exposed to Covid 19, or has mentioned symptoms of Covid 19 within the past 7 to 10 days? NO Pts daughter will be assisting pt due to medical reasons, She answered NO to all questions   If you have any concerns/questions about symptoms patients report during screening (either on the phone or at threshold). Contact the provider seeing the patient or DOD for further guidance.  If neither are available contact a member of the leadership team.

## 2018-08-31 ENCOUNTER — Ambulatory Visit (INDEPENDENT_AMBULATORY_CARE_PROVIDER_SITE_OTHER): Payer: Medicare Other | Admitting: Interventional Cardiology

## 2018-08-31 ENCOUNTER — Other Ambulatory Visit: Payer: Self-pay

## 2018-08-31 ENCOUNTER — Ambulatory Visit (INDEPENDENT_AMBULATORY_CARE_PROVIDER_SITE_OTHER): Payer: Medicare Other | Admitting: *Deleted

## 2018-08-31 ENCOUNTER — Encounter: Payer: Self-pay | Admitting: Interventional Cardiology

## 2018-08-31 VITALS — BP 128/72 | HR 63 | Ht 65.0 in | Wt 157.0 lb

## 2018-08-31 DIAGNOSIS — I482 Chronic atrial fibrillation, unspecified: Secondary | ICD-10-CM

## 2018-08-31 DIAGNOSIS — E785 Hyperlipidemia, unspecified: Secondary | ICD-10-CM

## 2018-08-31 DIAGNOSIS — I4891 Unspecified atrial fibrillation: Secondary | ICD-10-CM

## 2018-08-31 DIAGNOSIS — Z5181 Encounter for therapeutic drug level monitoring: Secondary | ICD-10-CM | POA: Diagnosis not present

## 2018-08-31 DIAGNOSIS — I25709 Atherosclerosis of coronary artery bypass graft(s), unspecified, with unspecified angina pectoris: Secondary | ICD-10-CM

## 2018-08-31 DIAGNOSIS — I5032 Chronic diastolic (congestive) heart failure: Secondary | ICD-10-CM

## 2018-08-31 DIAGNOSIS — Z7189 Other specified counseling: Secondary | ICD-10-CM

## 2018-08-31 DIAGNOSIS — Z7901 Long term (current) use of anticoagulants: Secondary | ICD-10-CM

## 2018-08-31 DIAGNOSIS — I10 Essential (primary) hypertension: Secondary | ICD-10-CM

## 2018-08-31 LAB — POCT INR: INR: 3.1 — AB (ref 2.0–3.0)

## 2018-08-31 NOTE — Progress Notes (Addendum)
Cardiology Office Note:    Date:  08/31/2018   ID:  Amber [name], DOB [birth year], MRN [id number]  PCP:  Antony Contras, MD  Cardiologist:  Sinclair Grooms, MD   Referring MD: Antony Contras, MD   Chief Complaint  Patient presents with  . Congestive Heart Failure    History of Present Illness:    Amber [name] is a 84 y.o. female with a hx of coronary artery disease with prior CABG in 2000, chronic atrial fibrillation, chronic diastolic heart failure, chronic kidney disease, stage III, hyperlipidemia (statin intolerant), and essential hypertensionwith hypertensive heart disease, cerebrovascular disease, and dementia.  She is accompanied by her daughter.  She has no complaints.  She specifically denies lower extremity swelling, orthopnea, chest pain, palpitations, and syncope.  No new neurological complaints.  Decreased memory and decreased hearing are obvious.  Compliant with her current medical regimen.   Past Medical History:  Diagnosis Date  . Abnormality of gait 08/02/2014  . Cerebrovascular disease 05/21/2016  . Cervical spinal stenosis   . Chronic atrial fibrillation    a. chronic/rate controlled;  b. chronic coumadin.  . Chronic diastolic CHF (congestive heart failure) ([place])    a. 02/2013 Echo: EF 55-60%, mild AI, mod dil LA.  . CKD (chronic kidney disease), stage III ([place])   . COPD with emphysema ([place])    PFT 05/02/10>>FEV1 1.35(62%), FEV1% 66, DLCO 75%  . Coronary artery disease    a. s/p CABG;  b. Abnormal nuc 2015 - managed medically.  . CVA (cerebral vascular accident) ([place])    left sided weakness  . Depression   . Fibromyalgia   . GERD (gastroesophageal reflux disease)    pepcid 2-3 times per week  . Gout   . Hemiparesis and alteration of sensations as late effects of stroke ([place]) 01/17/2015  . History of melanoma    squamous cell, melanoma  . Hyperlipidemia    a. statin intolerant, not felt to be candidate for PCSK9 due to chronically elevated CK levels.   . Hypertension   . Hypertensive heart disease   . Hypothyroidism   . Insomnia   . Memory change 01/23/2014  . Nasal polyposis   . Osteoarthritis   . Osteoporosis   . Pneumonia    1990  . Tobacco abuse     Past Surgical History:  Procedure Laterality Date  . ABDOMINAL HYSTERECTOMY    . BREAST LUMPECTOMY  1980s   Benign lesion - right  . carpel tunnel     right  . CORONARY ARTERY BYPASS GRAFT  2000  . EYE SURGERY     bilateral cataracts  . IR GENERIC HISTORICAL  03/31/2016   IR RADIOLOGIST EVAL & MGMT 03/31/2016 [facility]INTERV RAD  . RADIOLOGY WITH ANESTHESIA  12/24/2011   Procedure: RADIOLOGY WITH ANESTHESIA;  Surgeon: Medication Radiologist, MD;  Location: [facility];  Service: Radiology;  Laterality: N/A;  Extra Cranial Vascular Stent  . RADIOLOGY WITH ANESTHESIA N/A 07/19/2014   Procedure: ANGIOPLASTY;  Surgeon: Luanne Bras, MD;  Location: [facility];  Service: Radiology;  Laterality: N/A;  . RADIOLOGY WITH ANESTHESIA N/A 07/27/2014   Procedure: ANGIOPLASTY;  Surgeon: Luanne Bras, MD;  Location: [facility];  Service: Radiology;  Laterality: N/A;  . TONSILLECTOMY      Current Medications: Current Meds  Medication Sig  . allopurinol (ZYLOPRIM) 300 MG tablet Take 300 mg by mouth daily.  [name] amitriptyline (ELAVIL) 10 MG tablet TAKE 1 TABLET BY MOUTH AT BEDTIME DAILY  . amLODipine (NORVASC)  5 MG tablet TAKE 1 TABLET BY MOUTH EVERYDAY AT BEDTIME  . celecoxib (CELEBREX) 200 MG capsule Take 200 mg by mouth every morning.   . citalopram (CELEXA) 20 MG tablet TAKE 1 TABLET BY MOUTH TWICE A DAY  . clopidogrel (PLAVIX) 75 MG tablet TAKE 1 TABLET (75 MG TOTAL) BY MOUTH DAILY.  [name] COUMADIN 5 MG tablet TAKE 1 TO 1 &1/2 TABLETS DAILY AS DIRECTED  . donepezil (ARICEPT) 10 MG tablet Take 1 tablet (10 mg total) by mouth at bedtime.  . ergocalciferol (VITAMIN D2) 50000 UNITS capsule Take 50,000 Units by mouth 2 (two) times a week. Tuesday and Friday  . furosemide (LASIX) 40 MG tablet Take 80 mg by  mouth 2 (two) times daily.  [name] levothyroxine (SYNTHROID, LEVOTHROID) 25 MCG tablet Take 25 mcg by mouth every morning.   [name] losartan (COZAAR) 25 MG tablet Take 1 tablet (25 mg total) by mouth daily.  . memantine (NAMENDA) 10 MG tablet Take 1 tablet (10 mg total) by mouth 2 (two) times daily.  . metoprolol succinate (TOPROL-XL) 25 MG 24 hr tablet TAKE 1 TABLET BY MOUTH EVERY DAY  . nitroGLYCERIN (NITROSTAT) 0.4 MG SL tablet Place 1 tablet (0.4 mg total) under the tongue every 5 (five) minutes as needed for chest pain.  . potassium chloride SA (KLOR-CON M20) 20 MEQ tablet TAKE 1 TABLET EVERY OTHER DAY, ALTERNATING WITH 2 TABLETS EVERY OTHER DAY  . PROAIR HFA 108 (90 BASE) MCG/ACT inhaler INHALE 2 PUFFS INTO LUNGS EVERY 6 HOURS AS NEEDED FOR WHEEZING  . Respiratory Therapy Supplies (FLUTTER) DEVI Use as directed.     Allergies:   Ezetimibe, Welchol [colesevelam hcl], Adhesive [tape], Ceclor [cefaclor], Elastic bandages & [zinc], Latex, Lipitor [atorvastatin calcium], Mevacor [lovastatin], Pravachol, and Vasotec   Social History   Socioeconomic History  . Marital status: Married    Spouse name: Not on file  . Number of children: 2  . Years of education: Not on file  . Highest education level: Not on file  Occupational History  . Occupation: Retired    Fish farm manager: [employer]  . 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 Financial resource strain: Not on file  . Food insecurity    Worry: Not on file    Inability: Not on file  . Transportation needs    Medical: Not on file    Non-medical: Not on file  Tobacco Use  . Smoking status: Former Smoker    Packs/day: 0.40    Years: 25.00    Pack years: 10.00    Types: Cigarettes    Quit date: 02/10/1977    Years since quitting: 41.5  . Smokeless tobacco: Never Used  Substance and Sexual Activity  . Alcohol use: No  . Drug use: No  . Sexual activity: Not on file  Lifestyle  . Physical activity    Days per week: Not  on file    Minutes per session: Not on file  . Stress: Not on file  Relationships  . Social Herbalist on phone: Not on file    Gets together: Not on file    Attends religious service: Not on file    Active member of club or organization: Not on file    Attends meetings of clubs or organizations: Not on file    Relationship status: Not on file  Other Topics Concern  . Not on file  Social History Narrative   Patient is right  handed.   Patient lives at home with husband   Patient drinks 8 oz caffeine occasionally.     Family History: The patient's family history includes CAD in her brother and father; Diabetes type II in her brother; Stroke in her mother.  ROS:   Please see the history of present illness.    Decreased memory.  Decreased hearing.  All other systems reviewed and are negative.  EKGs/Labs/Other Studies Reviewed:    The following studies were reviewed today: No new data  EKG:  EKG atrial fibrillation, nonspecific ST-T wave abnormality, otherwise unremarkable.  Recent Labs: 09/29/2017: BUN 13; Creatinine, Ser 1.05; Hemoglobin 12.9; NT-Pro BNP 1,685; Platelets 232; Potassium 3.8; Sodium 141  Recent Lipid Panel    Component Value Date/Time   CHOL 228 (H) 03/06/2017 0410   TRIG 34 03/06/2017 0410   HDL 62 03/06/2017 0410   CHOLHDL 3.7 03/06/2017 0410   VLDL 7 03/06/2017 0410   LDLCALC 159 (H) 03/06/2017 0410   LDLDIRECT 236.0 08/02/2014 1418    Physical Exam:    VS:  BP 128/72   Pulse 63   Ht 5\' 5"  (1.651 m)   Wt 157 lb (71.2 kg)   SpO2 98%   BMI 26.13 kg/m     Wt Readings from Last 3 Encounters:  08/31/18 157 lb (71.2 kg)  09/29/17 162 lb (73.5 kg)  07/21/17 161 lb 8 oz (73.3 kg)     GEN: Elderly and frail. No acute distress HEENT: Normal NECK: No JVD. LYMPHATICS: No lymphadenopathy CARDIAC: II RR with 2/6 to 3/6 systolic outflow murmur but no gallop, or edema.[name] VASCULAR:  Normal Pulses. No bruits. RESPIRATORY:  Clear to  auscultation without rales, wheezing or rhonchi  ABDOMEN: Soft, non-tender, non-distended, No pulsatile mass, MUSCULOSKELETAL: No deformity  SKIN: Warm and dry NEUROLOGIC:  Alert and oriented x 3 PSYCHIATRIC:  Normal affect   ASSESSMENT:    1. Coronary artery disease involving coronary bypass graft of native heart with angina pectoris ([place])   2. Chronic atrial fibrillation   3. Chronic diastolic heart failure ([place])   4. Essential hypertension   5. Long term current use of anticoagulant therapy   6. Dyslipidemia   7. Educated About Covid-19 Virus Infection    PLAN:    In order of problems listed above:  1. Asymptomatic.  Secondary risk prevention discussed in brief detail 2. Controlled rate. 3. No evidence of volume overload.  Continue same therapy. 4. Target 130/80 mmHg or less. 5. Plan to continue anticoagulation therapy monitoring for evidence of bleeding.  Current regimen is Coumadin.  DOAC therapy has been offered but refused. 6. LDL target is discussed.  At her age aggressively pursue since she has statin intolerance. 7. Social distancing, masking, and washing discussed in detail.  She is elderly, frail, and has guarded intermediate term prognosis.  We will plan to see in 1 year but can see earlier if deterioration in cardiac related status.  Blood work is ordered today and attempt to reassess kidney function, electrolytes, and hemoglobin.  Medication Adjustments/Labs and Tests Ordered: Current medicines are reviewed at length with the patient today.  Concerns regarding medicines are outlined above.  Orders Placed This Encounter  Procedures  . Basic metabolic panel  . CBC  . Hepatic function panel  . EKG 12-Lead   No orders of the defined types were placed in this encounter.   Patient Instructions  Medication Instructions:  Your physician recommends that you continue on your current medications as directed. Please  refer to the Current Medication list given to you  today.  If you need a refill on your cardiac medications before your next appointment, please call your pharmacy.   Lab work: BMET, CBC and Liver today  If you have labs (blood work) drawn today and your tests are completely normal, you will receive your results only by: [name] MyChart Message (if you have MyChart) OR . A paper copy in the mail If you have any lab test that is abnormal or we need to change your treatment, we will call you to review the results.  Testing/Procedures: None  Follow-Up: At [facility], you and your health needs are our priority.  As part of our continuing mission to provide you with exceptional heart care, we have created designated Provider Care Teams.  These Care Teams include your primary Cardiologist (physician) and Advanced Practice Providers (APPs -  Physician Assistants and Nurse Practitioners) who all work together to provide you with the care you need, when you need it. You will need a follow up appointment in 12 months.  Please call our office 2 months in advance to schedule this appointment.  You may see Sinclair Grooms, MD or one of the following Advanced Practice Providers on your designated Care Team:   Truitt Merle, NP Cecilie Kicks, NP . Kathyrn Drown, NP  Any Other Special Instructions Will Be Listed Below (If Applicable).       Signed, Sinclair Grooms, MD  08/31/2018 5:40 PM    [facility]

## 2018-08-31 NOTE — Patient Instructions (Signed)
Description   Take 1 tablet today, continue on same dosage 1 tablet daily except 1.5 tablets on Tuesdays. Recheck INR in 4 weeks. Eat an extra serving of greens today. Call with any questions or problems [phone] to [facility]madin Clinic, Main # [phone].

## 2018-08-31 NOTE — Patient Instructions (Signed)
Medication Instructions:  Your physician recommends that you continue on your current medications as directed. Please refer to the Current Medication list given to you today.  If you need a refill on your cardiac medications before your next appointment, please call your pharmacy.   Lab work: BMET, CBC and Liver today  If you have labs (blood work) drawn today and your tests are completely normal, you will receive your results only by: [name] MyChart Message (if you have MyChart) OR . A paper copy in the mail If you have any lab test that is abnormal or we need to change your treatment, we will call you to review the results.  Testing/Procedures: None  Follow-Up: At [facility], you and your health needs are our priority.  As part of our continuing mission to provide you with exceptional heart care, we have created designated Provider Care Teams.  These Care Teams include your primary Cardiologist (physician) and Advanced Practice Providers (APPs -  Physician Assistants and Nurse Practitioners) who all work together to provide you with the care you need, when you need it. You will need a follow up appointment in 12 months.  Please call our office 2 months in advance to schedule this appointment.  You may see Sinclair Grooms, MD or one of the following Advanced Practice Providers on your designated Care Team:   Truitt Merle, NP Cecilie Kicks, NP . Kathyrn Drown, NP  Any Other Special Instructions Will Be Listed Below (If Applicable).

## 2018-09-01 LAB — BASIC METABOLIC PANEL
BUN/Creatinine Ratio: 15 (ref 12–28)
BUN: 17 mg/dL (ref 8–27)
CO2: 24 mmol/L (ref 20–29)
Calcium: 9.7 mg/dL (ref 8.7–10.3)
Chloride: 102 mmol/L (ref 96–106)
Creatinine, Ser: 1.14 mg/dL — ABNORMAL HIGH (ref 0.57–1.00)
GFR calc Af Amer: 52 mL/min/{1.73_m2} — ABNORMAL LOW (ref >59)
GFR calc non Af Amer: 45 mL/min/{1.73_m2} — ABNORMAL LOW (ref >59)
Glucose: 99 mg/dL (ref 65–99)
Potassium: 4.2 mmol/L (ref 3.5–5.2)
Sodium: 145 mmol/L — ABNORMAL HIGH (ref 134–144)

## 2018-09-01 LAB — CBC
Hematocrit: 42.6 % (ref 34.0–46.6)
Hemoglobin: 14.3 g/dL (ref 11.1–15.9)
MCH: 29.7 pg (ref 26.6–33.0)
MCHC: 33.6 g/dL (ref 31.5–35.7)
MCV: 89 fL (ref 79–97)
Platelets: 243 10*3/uL (ref 150–450)
RBC: 4.81 x10E6/uL (ref 3.77–5.28)
RDW: 14.7 % (ref 11.7–15.4)
WBC: 7.8 10*3/uL (ref 3.4–10.8)

## 2018-09-01 LAB — HEPATIC FUNCTION PANEL
ALT: 11 IU/L (ref 0–32)
AST: 21 IU/L (ref 0–40)
Albumin: 4.6 g/dL (ref 3.6–4.6)
Alkaline Phosphatase: 88 IU/L (ref 39–117)
Bilirubin Total: 0.4 mg/dL (ref 0.0–1.2)
Bilirubin, Direct: 0.12 mg/dL (ref 0.00–0.40)
Total Protein: 6.5 g/dL (ref 6.0–8.5)

## 2018-09-06 ENCOUNTER — Ambulatory Visit: Payer: Medicare Other | Admitting: Podiatry

## 2018-09-13 ENCOUNTER — Telehealth: Payer: Self-pay | Admitting: Neurology

## 2018-09-13 MED ORDER — RISPERIDONE 0.25 MG PO TABS: 0.2500 mg | ORAL_TABLET | Freq: Every day | ORAL | 1 refills | Status: DC

## 2018-09-13 NOTE — Telephone Encounter (Signed)
I reached out to the pt's daughter [name]). She states the pt has been having auditory ( hearing music) and visual (seeing people in the windows) for over a month now. Pt has been scheduled for her f/u on 10/04/2018 at 330 but the daughter wanted to see if Dr. Jannifer Franklin could recommend anything further until the office visit? Best call back # is [phone].

## 2018-09-13 NOTE — Telephone Encounter (Signed)
I called the daughter. The patient has had ongoing visual and auditory hallucinations. She has some understanding that the hallucinations not real.  The daughter took her off of the Aricept for a week but it made no difference, she has recently had a urinalysis sent in.  The hallucinations have been going on for several weeks.  The daughter just stopped Namenda to see if this would help the hallucinations.  I will call in a small prescription for Risperdal taking 0.25 mg at night, we will adjust the dose if needed.

## 2018-09-13 NOTE — Telephone Encounter (Signed)
Pt daughter has called stating that pt is having pt hallucinations.  Please call daughter

## 2018-09-13 NOTE — Addendum Note (Signed)
Addended by: Kathrynn Ducking on: 09/13/2018 04:42 PM   Modules accepted: Orders

## 2018-09-16 ENCOUNTER — Other Ambulatory Visit: Payer: Self-pay | Admitting: Neurology

## 2018-09-20 ENCOUNTER — Telehealth: Payer: Self-pay | Admitting: Pharmacist

## 2018-09-20 NOTE — Telephone Encounter (Signed)
Received call from [facility] RN that pt fell and broke her pelvis and now [facility] services. She was scheduled for an INR check on 8/17 in the office. I have canceled this visit and pt will have INR checked by home health RN on 8/18 with result called to Coumadin clinic.

## 2018-09-28 ENCOUNTER — Ambulatory Visit (INDEPENDENT_AMBULATORY_CARE_PROVIDER_SITE_OTHER): Payer: Medicare Other | Admitting: Cardiology

## 2018-09-28 DIAGNOSIS — Z5181 Encounter for therapeutic drug level monitoring: Secondary | ICD-10-CM

## 2018-09-28 DIAGNOSIS — I4891 Unspecified atrial fibrillation: Secondary | ICD-10-CM

## 2018-09-28 LAB — POCT INR: INR: 2.6 (ref 2.0–3.0)

## 2018-09-28 NOTE — Patient Instructions (Signed)
Description   Spoke with In[facility] and instructed pt to continue on same dosage 1 tablet daily except 1.5 tablets on Tuesdays. Recheck INR in 4 weeks. Call with any questions or problems [phone] to Coumadin Clinic, Main # [phone].

## 2018-09-30 ENCOUNTER — Other Ambulatory Visit: Payer: Self-pay | Admitting: Interventional Cardiology

## 2018-10-03 ENCOUNTER — Other Ambulatory Visit: Payer: Self-pay | Admitting: Nurse Practitioner

## 2018-10-05 ENCOUNTER — Telehealth: Payer: Self-pay | Admitting: Neurology

## 2018-10-07 ENCOUNTER — Encounter: Payer: Self-pay | Admitting: Neurology

## 2018-10-07 ENCOUNTER — Other Ambulatory Visit: Payer: Self-pay

## 2018-10-07 ENCOUNTER — Ambulatory Visit (INDEPENDENT_AMBULATORY_CARE_PROVIDER_SITE_OTHER): Payer: Medicare Other | Admitting: Neurology

## 2018-10-07 VITALS — BP 143/63 | HR 63 | Temp 96.9°F | Ht 65.0 in

## 2018-10-07 DIAGNOSIS — R413 Other amnesia: Secondary | ICD-10-CM | POA: Diagnosis not present

## 2018-10-07 DIAGNOSIS — F0390 Unspecified dementia without behavioral disturbance: Secondary | ICD-10-CM

## 2018-10-07 MED ORDER — DONEPEZIL HCL 5 MG PO TABS: 5.0000 mg | ORAL_TABLET | Freq: Every day | ORAL | 1 refills | Status: DC

## 2018-10-07 NOTE — Progress Notes (Signed)
Reason for visit: Dementia  Amber [name] is an 84 y.o. female  History of present illness:  Amber [name] is an 84 year old right-handed white female with a history of cerebrovascular disease with a stroke that left her with a left hemiparesis.  She has a gait disturbance associated with this and she has developed a progressive dementing illness.  She has been on Aricept and Namenda, she has vivid dreams at night, but taking the Aricept in the morning still does not correct all of the difficulty in the evenings.  The patient has had some problems with hallucinations but recently she has had a urinary tract infection.  Once treated, the patient has not had any hallucinations.  She does have a prescription for Risperdal but never took the medication.  She is on amitriptyline 10 mg every other day, she seems to be tolerating this dose reduction fairly well.  She returns for an evaluation.  She recently fell 3 weeks ago and had a pelvic ramus fracture.  She has recovered from this fairly well.  Past Medical History:  Diagnosis Date  . Abnormality of gait 08/02/2014  . Cerebrovascular disease 05/21/2016  . Cervical spinal stenosis   . Chronic atrial fibrillation    a. chronic/rate controlled;  b. chronic coumadin.  . Chronic diastolic CHF (congestive heart failure) ([place])    a. 02/2013 Echo: EF 55-60%, mild AI, mod dil LA.  . CKD (chronic kidney disease), stage III ([place])   . COPD with emphysema ([place])    PFT 05/02/10>>FEV1 1.35(62%), FEV1% 66, DLCO 75%  . Coronary artery disease    a. s/p CABG;  b. Abnormal nuc 2015 - managed medically.  . CVA (cerebral vascular accident) ([place])    left sided weakness  . Depression   . Fibromyalgia   . GERD (gastroesophageal reflux disease)    pepcid 2-3 times per week  . Gout   . Hemiparesis and alteration of sensations as late effects of stroke ([place]) 01/17/2015  . History of melanoma    squamous cell, melanoma  . Hyperlipidemia    a. statin intolerant, not  felt to be candidate for PCSK9 due to chronically elevated CK levels.  . Hypertension   . Hypertensive heart disease   . Hypothyroidism   . Insomnia   . Memory change 01/23/2014  . Nasal polyposis   . Osteoarthritis   . Osteoporosis   . Pneumonia    1990  . Tobacco abuse     Past Surgical History:  Procedure Laterality Date  . ABDOMINAL HYSTERECTOMY    . BREAST LUMPECTOMY  1980s   Benign lesion - right  . carpel tunnel     right  . CORONARY ARTERY BYPASS GRAFT  2000  . EYE SURGERY     bilateral cataracts  . IR GENERIC HISTORICAL  03/31/2016   IR RADIOLOGIST EVAL & MGMT 03/31/2016 [facility]-INTERV RAD  . RADIOLOGY WITH ANESTHESIA  12/24/2011   Procedure: RADIOLOGY WITH ANESTHESIA;  Surgeon: Medication Radiologist, MD;  Location: [place];  Service: Radiology;  Laterality: N/A;  Extra Cranial Vascular Stent  . RADIOLOGY WITH ANESTHESIA N/A 07/19/2014   Procedure: ANGIOPLASTY;  Surgeon: Luanne Bras, MD;  Location: [place];  Service: Radiology;  Laterality: N/A;  . RADIOLOGY WITH ANESTHESIA N/A 07/27/2014   Procedure: ANGIOPLASTY;  Surgeon: Luanne Bras, MD;  Location: [place];  Service: Radiology;  Laterality: N/A;  . TONSILLECTOMY      Family History  Problem Relation Age of Onset  . Stroke Mother   .  CAD Father   . Diabetes type II Brother   . CAD Brother     Social history:  reports that she quit smoking about 41 years ago. Her smoking use included cigarettes. She has a 10.00 pack-year smoking history. She has never used smokeless tobacco. She reports that she does not drink alcohol or use drugs.    Allergies  Allergen Reactions  . Ezetimibe Other (See Comments)    Myalgia  . Welchol [Colesevelam Hcl] Other (See Comments)    Muscle aches  . Adhesive [Tape] Itching, Rash and Other (See Comments)    Burning   . Ceclor [Cefaclor] Other (See Comments)    Unknown allergic reaction  . Elastic Bandages & [Zinc] Rash and Other (See Comments)    Turns red on the areas it  touches  . Latex Itching, Rash and Other (See Comments)    Burning   . Lipitor [Atorvastatin Calcium] Other (See Comments)    Increased fibromyalgia pain  . Mevacor [Lovastatin] Other (See Comments)    Increased fibromyalgia pain  . Pravachol Other (See Comments)    Increased fibromyalgia pain  . Vasotec Other (See Comments)    Unknown allergic reaction    Medications:  Prior to Admission medications   Medication Sig Start Date End Date Taking? Authorizing Provider  allopurinol (ZYLOPRIM) 300 MG tablet Take 300 mg by mouth daily.   Yes [provider]  amitriptyline (ELAVIL) 10 MG tablet TAKE 1 TABLET BY MOUTH AT BEDTIME DAILY Patient taking differently: Take 10 mg by mouth every other day.  08/02/18  Yes Kathrynn Ducking, MD  amLODipine (NORVASC) 5 MG tablet TAKE 1 TABLET BY MOUTH EVERYDAY AT BEDTIME 05/03/18  Yes Belva Crome, MD  celecoxib (CELEBREX) 200 MG capsule Take 200 mg by mouth every morning.    Yes [provider]  citalopram (CELEXA) 20 MG tablet TAKE 1 TABLET BY MOUTH TWICE A DAY 11/09/14  Yes Darlin Coco, MD  clopidogrel (PLAVIX) 75 MG tablet TAKE 1 TABLET (75 MG TOTAL) BY MOUTH DAILY. 11/19/17  Yes Belva Crome, MD  COUMADIN 5 MG tablet TAKE 1 TO 1 &1/2 TABLETS DAILY AS DIRECTED 05/28/18  Yes Belva Crome, MD  donepezil (ARICEPT) 10 MG tablet Take 1 tablet (10 mg total) by mouth at bedtime. 06/09/18  Yes Kathrynn Ducking, MD  ergocalciferol (VITAMIN D2) 50000 UNITS capsule Take 50,000 Units by mouth 2 (two) times a week. Tuesday and Friday   Yes [provider]  furosemide (LASIX) 40 MG tablet Take 80 mg by mouth 2 (two) times daily.   Yes [provider]  levothyroxine (SYNTHROID, LEVOTHROID) 25 MCG tablet Take 25 mcg by mouth every morning.    Yes [provider]  losartan (COZAAR) 25 MG tablet TAKE 1 TABLET BY MOUTH EVERY DAY 10/04/18  Yes Burtis Junes, NP  memantine (NAMENDA) 10 MG tablet Take 1 tablet (10 mg  total) by mouth 2 (two) times daily. 06/09/18  Yes Kathrynn Ducking, MD  metoprolol succinate (TOPROL-XL) 25 MG 24 hr tablet TAKE 1 TABLET BY MOUTH EVERY DAY 07/12/18  Yes Belva Crome, MD  nitroGLYCERIN (NITROSTAT) 0.4 MG SL tablet Place 1 tablet (0.4 mg total) under the tongue every 5 (five) minutes as needed for chest pain. 09/13/12  Yes Darlin Coco, MD  potassium chloride SA (KLOR-CON M20) 20 MEQ tablet TAKE 1 TABLET EVERY OTHER DAY, ALTERNATING WITH 2 TABLETS EVERY OTHER DAY 06/28/18  Yes Belva Crome, MD  PROAIR HFA 108 (90 BASE) MCG/ACT inhaler INHALE 2 PUFFS INTO LUNGS EVERY 6 HOURS AS NEEDED FOR WHEEZING 12/22/13  Yes Darlin Coco, MD  Respiratory Therapy Supplies (FLUTTER) DEVI Use as directed. 03/19/17  Yes Parrett, Tammy S, NP  risperiDONE (RISPERDAL) 0.25 MG tablet TAKE 1 TABLET (0.25 MG TOTAL) BY MOUTH AT BEDTIME. 09/20/18  Yes Kathrynn Ducking, MD    ROS:  Out of a complete 14 system review of symptoms, the patient complains only of the following symptoms, and all other reviewed systems are negative.  Memory disturbance Hallucinations Gait disturbance  Blood pressure (!) 143/63, pulse 63, temperature (!) 96.9 F (36.1 C), temperature source Oral, height 5\' 5"  (1.651 m).  Physical Exam  General: The patient is alert and cooperative at the time of the examination.  Skin: No significant peripheral edema is noted.   Neurologic Exam  Mental status: The patient is alert and oriented x 3 at the time of the examination. The Mini-Mental status examination done today shows a total score of 18/30.   Cranial nerves: Facial symmetry is present. Speech is normal, no aphasia or dysarthria is noted. Extraocular movements are full. Visual fields are full.  Motor: The patient has good strength in all 4 extremities.  The patient has a brace on the left ankle, equinovarus positioning of the left foot.  Sensory examination: Soft touch sensation is symmetric on the face, arms,  and legs.  Coordination: The patient has good finger-nose-finger and heel-to-shin bilaterally.  Gait and station: The patient has the ability to walk with a walker, gait is somewhat wide-based, she is fairly stable with a walker, takes good turns.  Reflexes: Deep tendon reflexes are symmetric.   Assessment/Plan:  1.  Dementia  2.  Cerebrovascular disease, left hemiparesis  3.  Gait disorder  4.  Hallucinations  The patient has both visual and auditory hallucinations but she has done well over the last several weeks after she had been treated for a bladder infection.  The Aricept will be reduced to 5 mg in the morning, the patient is having vivid dreams at night.  The patient will stop the amitriptyline.  She will follow-up here in 6 months.  She will continue the Namenda.  Jill Alexanders MD 10/07/2018 4:12 PM  [facility] [address] [place], [place] [address]  Phone [phone] Fax [phone]

## 2018-10-08 ENCOUNTER — Other Ambulatory Visit: Payer: Self-pay | Admitting: Interventional Cardiology

## 2018-10-08 MED ORDER — AMLODIPINE BESYLATE 5 MG PO TABS: ORAL_TABLET | ORAL | 3 refills | Status: DC

## 2018-10-09 ENCOUNTER — Other Ambulatory Visit: Payer: Self-pay | Admitting: Interventional Cardiology

## 2018-10-19 ENCOUNTER — Telehealth: Payer: Self-pay | Admitting: *Deleted

## 2018-10-19 NOTE — Telephone Encounter (Signed)
Daughter called and ststed the Physical therapist was there and wanted to get an order to check the pt's INR on 10/26/2018 instead of her coming into the office. Gave Amy with [facility] PT an order to check her INR and call to [place] and gave the direct number. Will cancel 10/26/2018 in office appt.

## 2018-10-20 ENCOUNTER — Ambulatory Visit: Payer: Medicare Other | Admitting: Podiatry

## 2018-10-26 ENCOUNTER — Ambulatory Visit (INDEPENDENT_AMBULATORY_CARE_PROVIDER_SITE_OTHER): Payer: Medicare Other | Admitting: Cardiology

## 2018-10-26 DIAGNOSIS — Z5181 Encounter for therapeutic drug level monitoring: Secondary | ICD-10-CM

## 2018-10-26 DIAGNOSIS — I4891 Unspecified atrial fibrillation: Secondary | ICD-10-CM

## 2018-10-26 LAB — POCT INR: INR: 1.4 — AB (ref 2.0–3.0)

## 2018-10-26 NOTE — Patient Instructions (Signed)
Description   Spoke with Amy RN [facility] and instructed pt to take 2 tablets today, 1.5 tablets tomorrow, then continue on same dosage 1 tablet daily except 1.5 tablets on Tuesdays. Recheck INR in 1 week. Call with any questions or problems [phone] to Coumadin Clinic, Main # [phone].

## 2018-11-02 ENCOUNTER — Ambulatory Visit (INDEPENDENT_AMBULATORY_CARE_PROVIDER_SITE_OTHER): Payer: Medicare Other | Admitting: Cardiology

## 2018-11-02 DIAGNOSIS — Z5181 Encounter for therapeutic drug level monitoring: Secondary | ICD-10-CM

## 2018-11-02 DIAGNOSIS — I4891 Unspecified atrial fibrillation: Secondary | ICD-10-CM | POA: Diagnosis not present

## 2018-11-02 LAB — POCT INR: INR: 2 (ref 2.0–3.0)

## 2018-11-17 ENCOUNTER — Other Ambulatory Visit: Payer: Self-pay

## 2018-11-17 ENCOUNTER — Ambulatory Visit: Payer: Medicare Other | Admitting: *Deleted

## 2018-11-17 DIAGNOSIS — Z5181 Encounter for therapeutic drug level monitoring: Secondary | ICD-10-CM

## 2018-11-17 DIAGNOSIS — I4891 Unspecified atrial fibrillation: Secondary | ICD-10-CM

## 2018-11-17 LAB — POCT INR: INR: 2.8 (ref 2.0–3.0)

## 2018-11-17 NOTE — Patient Instructions (Signed)
Description   Continue on same dosage 1 tablet daily except 1.5 tablets on Tuesdays. Recheck INR in 4 weeks in the office. Call with any questions or problems [phone] to Coumadin Clinic, Main # [phone].

## 2018-11-18 ENCOUNTER — Ambulatory Visit: Payer: Medicare Other | Admitting: Neurology

## 2018-12-15 ENCOUNTER — Ambulatory Visit: Payer: Medicare Other | Admitting: *Deleted

## 2018-12-15 ENCOUNTER — Other Ambulatory Visit: Payer: Self-pay

## 2018-12-15 DIAGNOSIS — I4891 Unspecified atrial fibrillation: Secondary | ICD-10-CM

## 2018-12-15 DIAGNOSIS — Z5181 Encounter for therapeutic drug level monitoring: Secondary | ICD-10-CM

## 2018-12-15 LAB — POCT INR: INR: 3.7 — AB (ref 2.0–3.0)

## 2018-12-15 NOTE — Progress Notes (Signed)
Pt's daughter requesting INR appointment to be scheduled 4 weeks out. Pt and daughter made aware of the risk of delaying getting her INR checked.  Scheduled appointment on 12/2 to get her INR checked.

## 2018-12-15 NOTE — Patient Instructions (Signed)
Description   Hold coumadin tonight, then continue on same dosage 1 tablet daily except 1.5 tablets on Tuesdays. Recheck INR in 4 weeks, per pt's request. Call with any questions or problems [phone] to Coumadin Clinic, Main # [phone].

## 2018-12-22 ENCOUNTER — Ambulatory Visit: Payer: Medicare Other | Admitting: Podiatry

## 2018-12-28 ENCOUNTER — Other Ambulatory Visit: Payer: Self-pay | Admitting: Interventional Cardiology

## 2018-12-31 ENCOUNTER — Other Ambulatory Visit: Payer: Self-pay | Admitting: Interventional Cardiology

## 2018-12-31 MED ORDER — POTASSIUM CHLORIDE CRYS ER 20 MEQ PO TBCR: EXTENDED_RELEASE_TABLET | ORAL | 2 refills | Status: DC

## 2019-01-12 ENCOUNTER — Ambulatory Visit: Payer: Medicare Other | Admitting: *Deleted

## 2019-01-12 ENCOUNTER — Other Ambulatory Visit: Payer: Self-pay

## 2019-01-12 DIAGNOSIS — I4891 Unspecified atrial fibrillation: Secondary | ICD-10-CM

## 2019-01-12 DIAGNOSIS — Z5181 Encounter for therapeutic drug level monitoring: Secondary | ICD-10-CM

## 2019-01-12 LAB — POCT INR: INR: 3.2 — AB (ref 2.0–3.0)

## 2019-01-12 NOTE — Patient Instructions (Addendum)
Description   Today take 1/2 tablet then start taking 1 tablet daily. Recheck INR in 3 weeks, per pt's request. Call with any questions or problems [phone] to Coumadin Clinic, Main # [phone].

## 2019-01-31 ENCOUNTER — Ambulatory Visit: Payer: Medicare Other

## 2019-01-31 ENCOUNTER — Other Ambulatory Visit: Payer: Self-pay

## 2019-01-31 DIAGNOSIS — Z5181 Encounter for therapeutic drug level monitoring: Secondary | ICD-10-CM

## 2019-01-31 DIAGNOSIS — I4891 Unspecified atrial fibrillation: Secondary | ICD-10-CM | POA: Diagnosis not present

## 2019-01-31 LAB — POCT INR: INR: 2.2 (ref 2.0–3.0)

## 2019-01-31 NOTE — Patient Instructions (Signed)
Description   Continue on same dosage 1 tablet daily. Recheck INR in 4 weeks. Call with any questions or problems [phone] to Coumadin Clinic, Main # [phone].

## 2019-02-22 ENCOUNTER — Ambulatory Visit: Payer: Medicare Other | Attending: Internal Medicine

## 2019-02-22 DIAGNOSIS — Z23 Encounter for immunization: Secondary | ICD-10-CM | POA: Insufficient documentation

## 2019-02-22 NOTE — Progress Notes (Signed)
   Covid-19 Vaccination Clinic  Name:  Amber [name]    MRN: [id number] DOB: 11-Jul-1934  02/22/2019  Amber [name] was observed post Covid-19 immunization for 30 minutes based on pre-vaccination screening without incidence. She was provided with Vaccine Information Sheet and instruction to access the V-Safe system.   Amber [name] was instructed to call 911 with any severe reactions post vaccine: [name] Difficulty breathing  . Swelling of your face and throat  . A fast heartbeat  . A bad rash all over your body  . Dizziness and weakness    Immunizations Administered    Name Date Dose VIS Date Route   Pfizer COVID-19 Vaccine 02/22/2019 11:49 AM 0.3 mL 01/21/2019 Intramuscular   Manufacturer: [place]   Lot: [id number]   [place]: [id number]

## 2019-03-01 ENCOUNTER — Ambulatory Visit (INDEPENDENT_AMBULATORY_CARE_PROVIDER_SITE_OTHER): Payer: Medicare PPO | Admitting: *Deleted

## 2019-03-01 ENCOUNTER — Other Ambulatory Visit: Payer: Self-pay

## 2019-03-01 DIAGNOSIS — I4891 Unspecified atrial fibrillation: Secondary | ICD-10-CM | POA: Diagnosis not present

## 2019-03-01 DIAGNOSIS — Z5181 Encounter for therapeutic drug level monitoring: Secondary | ICD-10-CM

## 2019-03-01 LAB — POCT INR: INR: 1.4 — AB (ref 2.0–3.0)

## 2019-03-01 NOTE — Patient Instructions (Signed)
Description    Take 1.5 tablets today and tomorrow, then continue on same dosage 1 tablet daily. Recheck INR in 2 weeks. Call with any questions or problems [phone] to Coumadin Clinic, Main # [phone].

## 2019-03-13 ENCOUNTER — Ambulatory Visit: Payer: Medicare PPO | Attending: Internal Medicine

## 2019-03-13 DIAGNOSIS — Z23 Encounter for immunization: Secondary | ICD-10-CM | POA: Insufficient documentation

## 2019-03-13 NOTE — Progress Notes (Signed)
   Covid-19 Vaccination Clinic  Name:  Amber [name]    MRN: [id number] DOB: Jun 16, 1934  03/13/2019  [name] was observed post Covid-19 immunization for 30 minutes based on pre-vaccination screening without incidence. She was provided with Vaccine Information Sheet and instruction to access the V-Safe system.   [name] was instructed to call 911 with any severe reactions post vaccine: [name] Difficulty breathing  . Swelling of your face and throat  . A fast heartbeat  . A bad rash all over your body  . Dizziness and weakness    Immunizations Administered    Name Date Dose VIS Date Route   Pfizer COVID-19 Vaccine 03/13/2019 10:25 AM 0.3 mL 01/21/2019 Intramuscular   Manufacturer: [place]   Lot: [id number]   [place]: [id number]

## 2019-03-18 ENCOUNTER — Other Ambulatory Visit: Payer: Self-pay

## 2019-03-18 ENCOUNTER — Ambulatory Visit (INDEPENDENT_AMBULATORY_CARE_PROVIDER_SITE_OTHER): Payer: Medicare PPO | Admitting: *Deleted

## 2019-03-18 DIAGNOSIS — Z5181 Encounter for therapeutic drug level monitoring: Secondary | ICD-10-CM

## 2019-03-18 DIAGNOSIS — I4891 Unspecified atrial fibrillation: Secondary | ICD-10-CM

## 2019-03-18 LAB — POCT INR: INR: 2 (ref 2.0–3.0)

## 2019-03-18 NOTE — Patient Instructions (Signed)
Description   Continue on same dosage 1 tablet daily. Recheck INR in 4 weeks. Call with any questions or problems [phone] to Coumadin Clinic, Main # [phone].

## 2019-03-22 ENCOUNTER — Ambulatory Visit: Payer: Medicare Other | Admitting: Podiatry

## 2019-04-19 ENCOUNTER — Ambulatory Visit (INDEPENDENT_AMBULATORY_CARE_PROVIDER_SITE_OTHER): Payer: Medicare PPO | Admitting: Neurology

## 2019-04-19 ENCOUNTER — Other Ambulatory Visit: Payer: Self-pay

## 2019-04-19 ENCOUNTER — Encounter: Payer: Self-pay | Admitting: Neurology

## 2019-04-19 VITALS — BP 118/76 | HR 75 | Temp 98.9°F | Ht 66.0 in | Wt 149.6 lb

## 2019-04-19 DIAGNOSIS — F0390 Unspecified dementia without behavioral disturbance: Secondary | ICD-10-CM

## 2019-04-19 MED ORDER — MEMANTINE HCL 10 MG PO TABS: 10.0000 mg | ORAL_TABLET | Freq: Two times a day (BID) | ORAL | 3 refills | Status: DC

## 2019-04-19 MED ORDER — DONEPEZIL HCL 5 MG PO TABS: 5.0000 mg | ORAL_TABLET | Freq: Every day | ORAL | 3 refills | Status: DC

## 2019-04-19 NOTE — Progress Notes (Signed)
PATIENT: Amber [name] DOB: October 27, 1934  REASON FOR VISIT: follow up HISTORY FROM: patient  HISTORY OF PRESENT ILLNESS: Today 04/19/19  Amber [name] is an 85 year old female with history of cerebrovascular disease with a stroke that left her with a left hemiparesis.  She has a gait disturbance, a progressive dementing illness.  She remains on Aricept and Namenda, she is on low-dose Aricept 5 mg due to vivid dreams.  At last visit the amitriptyline was stopped due to reported visual and auditory hallucinations.  She has a prescription for Risperdal, but has never taken it.  She feels her memory is stable.  She lives with her husband, her daughters check on her every day, they have been alternating staying the night, as she had 2 significant falls last year.  She sleeps in a recliner, due to CHF issues, they sleep on the couch, just in case she needs to go to the bathroom.  She no longer has hallucinations, previously these have been associated with UTI.  She uses a walker to ambulate.  Her daughters put her pills in a pillbox, she takes them.  Lately, she seems to have found a burst of energy, is seeking more independence, may get frustrated with her daughters if they try to help her too much, no signs of infection.  She does not do any cooking, they get take out.  She presents today accompanied by her daughter Amber [name].  HISTORY 10/07/2018 Amber [name]: Amber [name] is an 85 year old right-handed white female with a history of cerebrovascular disease with a stroke that left her with a left hemiparesis.  She has a gait disturbance associated with this and she has developed a progressive dementing illness.  She has been on Aricept and Namenda, she has vivid dreams at night, but taking the Aricept in the morning still does not correct all of the difficulty in the evenings.  The patient has had some problems with hallucinations but recently she has had a urinary tract infection.  Once treated, the patient has not had  any hallucinations.  She does have a prescription for Risperdal but never took the medication.  She is on amitriptyline 10 mg every other day, she seems to be tolerating this dose reduction fairly well.  She returns for an evaluation.  She recently fell 3 weeks ago and had a pelvic ramus fracture.  She has recovered from this fairly well.   REVIEW OF SYSTEMS: Out of a complete 14 system review of symptoms, the patient complains only of the following symptoms, and all other reviewed systems are negative.  Memory loss  ALLERGIES: Allergies  Allergen Reactions  . Ezetimibe Other (See Comments)    Myalgia  . Welchol [Colesevelam Hcl] Other (See Comments)    Muscle aches  . Adhesive [Tape] Itching, Rash and Other (See Comments)    Burning   . Ceclor [Cefaclor] Other (See Comments)    Unknown allergic reaction  . Elastic Bandages & [Zinc] Rash and Other (See Comments)    Turns red on the areas it touches  . Latex Itching, Rash and Other (See Comments)    Burning   . Lipitor [Atorvastatin Calcium] Other (See Comments)    Increased fibromyalgia pain  . Mevacor [Lovastatin] Other (See Comments)    Increased fibromyalgia pain  . Pravachol Other (See Comments)    Increased fibromyalgia pain  . Vasotec Other (See Comments)    Unknown allergic reaction    HOME MEDICATIONS: Outpatient Medications Prior to Visit  Medication  Sig Dispense Refill  . allopurinol (ZYLOPRIM) 300 MG tablet Take 300 mg by mouth daily.    [name] amLODipine (NORVASC) 5 MG tablet TAKE 1 TABLET BY MOUTH EVERYDAY AT BEDTIME 90 tablet 3  . celecoxib (CELEBREX) 200 MG capsule Take 200 mg by mouth every morning.     . citalopram (CELEXA) 20 MG tablet TAKE 1 TABLET BY MOUTH TWICE A DAY 60 tablet 11  . clopidogrel (PLAVIX) 75 MG tablet TAKE 1 TABLET (75 MG TOTAL) BY MOUTH DAILY. 90 tablet 3  . COUMADIN 5 MG tablet TAKE 1 TO 1 &1/2 TABLETS DAILY AS DIRECTED 120 tablet 1  . donepezil (ARICEPT) 5 MG tablet Take 1 tablet (5 mg total)  by mouth daily. 90 tablet 1  . ergocalciferol (VITAMIN D2) 50000 UNITS capsule Take 50,000 Units by mouth 2 (two) times a week. Tuesday and Friday    . furosemide (LASIX) 40 MG tablet Take 80 mg by mouth 2 (two) times daily.    [name] levothyroxine (SYNTHROID, LEVOTHROID) 25 MCG tablet Take 25 mcg by mouth every morning.     [name] losartan (COZAAR) 25 MG tablet TAKE 1 TABLET BY MOUTH EVERY DAY 90 tablet 3  . memantine (NAMENDA) 10 MG tablet Take 1 tablet (10 mg total) by mouth 2 (two) times daily. 180 tablet 3  . metoprolol succinate (TOPROL-XL) 25 MG 24 hr tablet TAKE 1 TABLET BY MOUTH EVERY DAY 90 tablet 3  . nitroGLYCERIN (NITROSTAT) 0.4 MG SL tablet Place 1 tablet (0.4 mg total) under the tongue every 5 (five) minutes as needed for chest pain. 25 tablet 3  . potassium chloride SA (KLOR-CON M20) 20 MEQ tablet TAKE 1 TABLET EVERY OTHER DAY, ALTERNATING WITH 2 TABLETS EVERY OTHER DAY 135 tablet 2  . PROAIR HFA 108 (90 BASE) MCG/ACT inhaler INHALE 2 PUFFS INTO LUNGS EVERY 6 HOURS AS NEEDED FOR WHEEZING 8.5 each 0  . Respiratory Therapy Supplies (FLUTTER) DEVI Use as directed. 1 each 0  . risperiDONE (RISPERDAL) 0.25 MG tablet TAKE 1 TABLET (0.25 MG TOTAL) BY MOUTH AT BEDTIME. 90 tablet 1   No facility-administered medications prior to visit.    PAST MEDICAL HISTORY: Past Medical History:  Diagnosis Date  . Abnormality of gait 08/02/2014  . Cerebrovascular disease 05/21/2016  . Cervical spinal stenosis   . Chronic atrial fibrillation (HCC)    a. chronic/rate controlled;  b. chronic coumadin.  . Chronic diastolic CHF (congestive heart failure) ([place])    a. 02/2013 Echo: EF 55-60%, mild AI, mod dil LA.  . CKD (chronic kidney disease), stage III   . COPD with emphysema ([place])    PFT 05/02/10>>FEV1 1.35(62%), FEV1% 66, DLCO 75%  . Coronary artery disease    a. s/p CABG;  b. Abnormal nuc 2015 - managed medically.  . CVA (cerebral vascular accident) ([place])    left sided weakness  . Depression   .  Fibromyalgia   . GERD (gastroesophageal reflux disease)    pepcid 2-3 times per week  . Gout   . Hemiparesis and alteration of sensations as late effects of stroke ([place]) 01/17/2015  . History of melanoma    squamous cell, melanoma  . Hyperlipidemia    a. statin intolerant, not felt to be candidate for PCSK9 due to chronically elevated CK levels.  . Hypertension   . Hypertensive heart disease   . Hypothyroidism   . Insomnia   . Memory change 01/23/2014  . Nasal polyposis   . Osteoarthritis   . Osteoporosis   .  Pneumonia    1990  . Tobacco abuse     PAST SURGICAL HISTORY: Past Surgical History:  Procedure Laterality Date  . ABDOMINAL HYSTERECTOMY    . BREAST LUMPECTOMY  1980s   Benign lesion - right  . carpel tunnel     right  . CORONARY ARTERY BYPASS GRAFT  2000  . EYE SURGERY     bilateral cataracts  . IR GENERIC HISTORICAL  03/31/2016   IR RADIOLOGIST EVAL & MGMT 03/31/2016 [facility]INTER[facility]  . RADIOLOGY WITH ANESTHESIA  12/24/2011   Procedure: RADIOLOGY WITH ANESTHESIA;  Surgeon: Medication Radiologist, MD;  Location: [facility];  Service: Radiology;  Laterality: N/A;  Extra Cranial Vascular Stent  . RADIOLOGY WITH ANESTHESIA N/A 07/19/2014   Procedure: ANGIOPLASTY;  Surgeon: Luanne Bras, MD;  Location: [facility];  Service: Radiology;  Laterality: N/A;  . RADIOLOGY WITH ANESTHESIA N/A 07/27/2014   Procedure: ANGIOPLASTY;  Surgeon: Luanne Bras, MD;  Location: [facility];  Service: Radiology;  Laterality: N/A;  . TONSILLECTOMY      FAMILY HISTORY: Family History  Problem Relation Age of Onset  . Stroke Mother   . CAD Father   . Diabetes type II Brother   . CAD Brother     SOCIAL HISTORY: Social History   Socioeconomic History  . Marital status: Married    Spouse name: Not on file  . Number of children: 2  . Years of education: Not on file  . Highest education level: Not on file  Occupational History  . Occupation: Retired    Fish farm manager: [employer]  .  Occupation: Network engineer    Comment: Retired  Tobacco Use  . Smoking status: Former Smoker    Packs/day: 0.40    Years: 25.00    Pack years: 10.00    Types: Cigarettes    Quit date: 02/10/1977    Years since quitting: 42.2  . Smokeless tobacco: Never Used  Substance and Sexual Activity  . Alcohol use: No  . Drug use: No  . Sexual activity: Not on file  Other Topics Concern  . Not on file  Social History Narrative   Patient is right handed.   Patient lives at home with husband   Patient drinks 8 oz caffeine occasionally.   Social Determinants of Health   Financial Resource Strain:   . Difficulty of Paying Living Expenses: Not on file  Food Insecurity:   . Worried About Charity fundraiser in the Last Year: Not on file  . Ran Out of Food in the Last Year: Not on file  Transportation Needs:   . Lack of Transportation (Medical): Not on file  . Lack of Transportation (Non-Medical): Not on file  Physical Activity:   . Days of Exercise per Week: Not on file  . Minutes of Exercise per Session: Not on file  Stress:   . Feeling of Stress : Not on file  Social Connections:   . Frequency of Communication with Friends and Family: Not on file  . Frequency of Social Gatherings with Friends and Family: Not on file  . Attends Religious Services: Not on file  . Active Member of Clubs or Organizations: Not on file  . Attends Archivist Meetings: Not on file  . Marital Status: Not on file  Intimate Partner Violence:   . Fear of Current or Ex-Partner: Not on file  . Emotionally Abused: Not on file  . Physically Abused: Not on file  . Sexually Abused: Not on file  PHYSICAL EXAM  Vitals:   04/19/19 1538  BP: 118/76  Pulse: 75  Temp: 98.9 F (37.2 C)  TempSrc: Oral  Weight: 149 lb 9.6 oz (67.9 kg)  Height: 5\' 6"  (1.676 m)   Body mass index is 24.15 kg/m.  Generalized: Well developed, in no acute distress  MMSE - Mini Mental State Exam 04/19/2019 10/07/2018 07/21/2017   Not completed: (No Data) (No Data) -  Orientation to time 2 3 4   Orientation to Place 3 3 4   Registration 3 3 3   Attention/ Calculation 1 1 5   Recall 0 0 3  Language- name 2 objects 2 1 2   Language- repeat 1 1 1   Language- follow 3 step command 2 3 2   Language- follow 3 step command-comments she gave the paper back to me after she folded it - -  Language- read & follow direction 1 1 1   Write a sentence 1 1 1   Copy design 1 1 1   Total score 17 18 27     Neurological examination  Mentation: Alert oriented to time, place, history taking but most is provided by her daughter. Follows all commands speech and language fluent, some repetitive questioning noted Cranial nerve II-XII: Pupils were equal round reactive to light. Extraocular movements were full, visual field were full on confrontational test. Facial sensation and strength were normal.  Head turning and shoulder shrug  were normal and symmetric. Motor: Good strength of all extremities, has a brace on the left ankle Sensory: Sensory testing is intact to soft touch on all 4 extremities. No evidence of extinction is noted.  Coordination: Cerebellar testing reveals good finger-nose-finger and heel-to-shin bilaterally.  Gait and station: She is able to rise from seated position with pushoff, get on exam table, is able to walk with walker, somewhat wide-based, but stable, good pace Reflexes: Deep tendon reflexes are symmetric  DIAGNOSTIC DATA (LABS, IMAGING, TESTING) - I reviewed patient records, labs, notes, testing and imaging myself where available.  Lab Results  Component Value Date   WBC 7.8 08/31/2018   HGB 14.3 08/31/2018   HCT 42.6 08/31/2018   MCV 89 08/31/2018   PLT 243 08/31/2018      Component Value Date/Time   NA 145 (H) 08/31/2018 1601   K 4.2 08/31/2018 1601   CL 102 08/31/2018 1601   CO2 24 08/31/2018 1601   GLUCOSE 99 08/31/2018 1601   GLUCOSE 176 (H) 03/07/2017 0452   BUN 17 08/31/2018 1601   CREATININE  1.14 (H) 08/31/2018 1601   CREATININE 0.97 (H) 11/09/2015 1326   CALCIUM 9.7 08/31/2018 1601   PROT 6.5 08/31/2018 1601   ALBUMIN 4.6 08/31/2018 1601   AST 21 08/31/2018 1601   ALT 11 08/31/2018 1601   ALKPHOS 88 08/31/2018 1601   BILITOT 0.4 08/31/2018 1601   GFRNONAA 45 (L) 08/31/2018 1601   GFRAA 52 (L) 08/31/2018 1601   Lab Results  Component Value Date   CHOL 228 (H) 03/06/2017   HDL 62 03/06/2017   LDLCALC 159 (H) 03/06/2017   LDLDIRECT 236.0 08/02/2014   TRIG 34 03/06/2017   CHOLHDL 3.7 03/06/2017   Lab Results  Component Value Date   HGBA1C 6.0 (H) 03/05/2017   Lab Results  Component Value Date   VITAMINB12 335 03/05/2017   Lab Results  Component Value Date   TSH 2.045 03/05/2017      ASSESSMENT AND PLAN 85 y.o. year old female  has a past medical history of Abnormality of gait (08/02/2014), Cerebrovascular disease (05/21/2016),  Cervical spinal stenosis, Chronic atrial fibrillation (HCC), Chronic diastolic CHF (congestive heart failure) ([place]), CKD (chronic kidney disease), stage III, COPD with emphysema ([place]), Coronary artery disease, CVA (cerebral vascular accident) ([place]), Depression, Fibromyalgia, GERD (gastroesophageal reflux disease), Gout, Hemiparesis and alteration of sensations as late effects of stroke ([place]) (01/17/2015), History of melanoma, Hyperlipidemia, Hypertension, Hypertensive heart disease, Hypothyroidism, Insomnia, Memory change (01/23/2014), Nasal polyposis, Osteoarthritis, Osteoporosis, Pneumonia, and Tobacco abuse. here with:  1.  Dementia 2.  Cerebrovascular disease, left hemiparesis 3.  Gait disorder  Her memory seems to be overall stable, 17/30.  She will remain on lower dose Aricept 5 mg in the morning due to previous vivid dreams, along with Namenda 10 mg twice a day.  She is no longer having hallucinations, she never started the Risperdal. She will follow-up in 1 year or sooner if needed.   I spent 15 minutes with the patient. 50% of this  time was spent discussing her plan of care.   Butler Denmark, AGNP-C, DNP 04/19/2019, 3:50 PM [facility] [address], [place], [place] [address] [phone]

## 2019-04-19 NOTE — Patient Instructions (Addendum)
It was great to meet you today! Continue Aricept and Namenda  Memory score was 17/30 today See you back in 1 year

## 2019-04-20 ENCOUNTER — Ambulatory Visit (INDEPENDENT_AMBULATORY_CARE_PROVIDER_SITE_OTHER): Payer: Medicare PPO | Admitting: *Deleted

## 2019-04-20 DIAGNOSIS — Z5181 Encounter for therapeutic drug level monitoring: Secondary | ICD-10-CM | POA: Diagnosis not present

## 2019-04-20 DIAGNOSIS — I4891 Unspecified atrial fibrillation: Secondary | ICD-10-CM | POA: Diagnosis not present

## 2019-04-20 LAB — POCT INR: INR: 2.2 (ref 2.0–3.0)

## 2019-04-20 NOTE — Patient Instructions (Signed)
Description   Continue on same dosage 1 tablet daily. Recheck INR in 5 weeks. Call with any questions or problems [phone] to [facility], Main # [phone].

## 2019-04-21 NOTE — Progress Notes (Signed)
I have read the note, and I agree with the clinical assessment and plan.  Amber [name] K Amber [name]   

## 2019-04-28 ENCOUNTER — Inpatient Hospital Stay (HOSPITAL_COMMUNITY): Payer: Medicare PPO

## 2019-04-28 ENCOUNTER — Emergency Department (HOSPITAL_COMMUNITY): Payer: Medicare PPO

## 2019-04-28 ENCOUNTER — Observation Stay (HOSPITAL_COMMUNITY)
Admission: EM | Admit: 2019-04-28 | Discharge: 2019-04-30 | Disposition: A | Discharge status: Home / Self Care | Payer: Medicare PPO | Attending: Physician Assistant | Admitting: Physician Assistant

## 2019-04-28 ENCOUNTER — Other Ambulatory Visit: Payer: Self-pay

## 2019-04-28 ENCOUNTER — Encounter (HOSPITAL_COMMUNITY): Payer: Self-pay | Admitting: Emergency Medicine

## 2019-04-28 DIAGNOSIS — I700 Atherosclerosis of aorta: Secondary | ICD-10-CM | POA: Insufficient documentation

## 2019-04-28 DIAGNOSIS — Z79899 Other long term (current) drug therapy: Secondary | ICD-10-CM | POA: Diagnosis not present

## 2019-04-28 DIAGNOSIS — J449 Chronic obstructive pulmonary disease, unspecified: Secondary | ICD-10-CM | POA: Insufficient documentation

## 2019-04-28 DIAGNOSIS — N189 Chronic kidney disease, unspecified: Secondary | ICD-10-CM | POA: Diagnosis not present

## 2019-04-28 DIAGNOSIS — M1990 Unspecified osteoarthritis, unspecified site: Secondary | ICD-10-CM | POA: Insufficient documentation

## 2019-04-28 DIAGNOSIS — I2510 Atherosclerotic heart disease of native coronary artery without angina pectoris: Secondary | ICD-10-CM | POA: Diagnosis not present

## 2019-04-28 DIAGNOSIS — M797 Fibromyalgia: Secondary | ICD-10-CM | POA: Insufficient documentation

## 2019-04-28 DIAGNOSIS — N183 Chronic kidney disease, stage 3 unspecified: Secondary | ICD-10-CM | POA: Diagnosis not present

## 2019-04-28 DIAGNOSIS — Z7901 Long term (current) use of anticoagulants: Secondary | ICD-10-CM | POA: Diagnosis not present

## 2019-04-28 DIAGNOSIS — S2242XA Multiple fractures of ribs, left side, initial encounter for closed fracture: Secondary | ICD-10-CM | POA: Diagnosis not present

## 2019-04-28 DIAGNOSIS — I5032 Chronic diastolic (congestive) heart failure: Secondary | ICD-10-CM | POA: Insufficient documentation

## 2019-04-28 DIAGNOSIS — T1490XA Injury, unspecified, initial encounter: Secondary | ICD-10-CM | POA: Diagnosis present

## 2019-04-28 DIAGNOSIS — W109XXA Fall (on) (from) unspecified stairs and steps, initial encounter: Secondary | ICD-10-CM | POA: Insufficient documentation

## 2019-04-28 DIAGNOSIS — S2249XA Multiple fractures of ribs, unspecified side, initial encounter for closed fracture: Secondary | ICD-10-CM

## 2019-04-28 DIAGNOSIS — W19XXXA Unspecified fall, initial encounter: Secondary | ICD-10-CM

## 2019-04-28 DIAGNOSIS — I69354 Hemiplegia and hemiparesis following cerebral infarction affecting left non-dominant side: Secondary | ICD-10-CM | POA: Diagnosis not present

## 2019-04-28 DIAGNOSIS — E785 Hyperlipidemia, unspecified: Secondary | ICD-10-CM | POA: Insufficient documentation

## 2019-04-28 DIAGNOSIS — F329 Major depressive disorder, single episode, unspecified: Secondary | ICD-10-CM | POA: Insufficient documentation

## 2019-04-28 DIAGNOSIS — W108XXA Fall (on) (from) other stairs and steps, initial encounter: Secondary | ICD-10-CM

## 2019-04-28 DIAGNOSIS — K449 Diaphragmatic hernia without obstruction or gangrene: Secondary | ICD-10-CM | POA: Insufficient documentation

## 2019-04-28 DIAGNOSIS — S2232XA Fracture of one rib, left side, initial encounter for closed fracture: Secondary | ICD-10-CM

## 2019-04-28 DIAGNOSIS — I130 Hypertensive heart and chronic kidney disease with heart failure and stage 1 through stage 4 chronic kidney disease, or unspecified chronic kidney disease: Secondary | ICD-10-CM | POA: Diagnosis not present

## 2019-04-28 DIAGNOSIS — S0003XA Contusion of scalp, initial encounter: Secondary | ICD-10-CM | POA: Diagnosis present

## 2019-04-28 DIAGNOSIS — Z20822 Contact with and (suspected) exposure to covid-19: Secondary | ICD-10-CM | POA: Diagnosis not present

## 2019-04-28 DIAGNOSIS — Z85820 Personal history of malignant melanoma of skin: Secondary | ICD-10-CM | POA: Insufficient documentation

## 2019-04-28 DIAGNOSIS — I129 Hypertensive chronic kidney disease with stage 1 through stage 4 chronic kidney disease, or unspecified chronic kidney disease: Secondary | ICD-10-CM | POA: Insufficient documentation

## 2019-04-28 DIAGNOSIS — I482 Chronic atrial fibrillation, unspecified: Secondary | ICD-10-CM | POA: Insufficient documentation

## 2019-04-28 DIAGNOSIS — F0390 Unspecified dementia without behavioral disturbance: Secondary | ICD-10-CM | POA: Insufficient documentation

## 2019-04-28 DIAGNOSIS — Z791 Long term (current) use of non-steroidal anti-inflammatories (NSAID): Secondary | ICD-10-CM | POA: Insufficient documentation

## 2019-04-28 DIAGNOSIS — K219 Gastro-esophageal reflux disease without esophagitis: Secondary | ICD-10-CM | POA: Diagnosis not present

## 2019-04-28 DIAGNOSIS — S22009A Unspecified fracture of unspecified thoracic vertebra, initial encounter for closed fracture: Secondary | ICD-10-CM | POA: Diagnosis not present

## 2019-04-28 DIAGNOSIS — S22000A Wedge compression fracture of unspecified thoracic vertebra, initial encounter for closed fracture: Secondary | ICD-10-CM

## 2019-04-28 DIAGNOSIS — J90 Pleural effusion, not elsewhere classified: Secondary | ICD-10-CM | POA: Insufficient documentation

## 2019-04-28 DIAGNOSIS — S2239XA Fracture of one rib, unspecified side, initial encounter for closed fracture: Secondary | ICD-10-CM

## 2019-04-28 DIAGNOSIS — S93401A Sprain of unspecified ligament of right ankle, initial encounter: Secondary | ICD-10-CM | POA: Diagnosis not present

## 2019-04-28 DIAGNOSIS — Z7902 Long term (current) use of antithrombotics/antiplatelets: Secondary | ICD-10-CM | POA: Insufficient documentation

## 2019-04-28 DIAGNOSIS — Z951 Presence of aortocoronary bypass graft: Secondary | ICD-10-CM | POA: Diagnosis not present

## 2019-04-28 DIAGNOSIS — Z87891 Personal history of nicotine dependence: Secondary | ICD-10-CM | POA: Insufficient documentation

## 2019-04-28 DIAGNOSIS — E039 Hypothyroidism, unspecified: Secondary | ICD-10-CM | POA: Insufficient documentation

## 2019-04-28 LAB — CBG MONITORING, ED: Glucose-Capillary: 122 mg/dL — ABNORMAL HIGH (ref 70–99)

## 2019-04-28 LAB — CBC WITH DIFFERENTIAL/PLATELET
Abs Immature Granulocytes: 0.07 10*3/uL (ref 0.00–0.07)
Basophils Absolute: 0.1 10*3/uL (ref 0.0–0.1)
Basophils Relative: 1 %
Eosinophils Absolute: 0.1 10*3/uL (ref 0.0–0.5)
Eosinophils Relative: 1 %
HCT: 47.7 % — ABNORMAL HIGH (ref 36.0–46.0)
Hemoglobin: 14.5 g/dL (ref 12.0–15.0)
Immature Granulocytes: 1 %
Lymphocytes Relative: 13 %
Lymphs Abs: 1.6 10*3/uL (ref 0.7–4.0)
MCH: 29.4 pg (ref 26.0–34.0)
MCHC: 30.4 g/dL (ref 30.0–36.0)
MCV: 96.8 fL (ref 80.0–100.0)
Monocytes Absolute: 0.7 10*3/uL (ref 0.1–1.0)
Monocytes Relative: 6 %
Neutro Abs: 9.8 10*3/uL — ABNORMAL HIGH (ref 1.7–7.7)
Neutrophils Relative %: 78 %
Platelets: 228 10*3/uL (ref 150–400)
RBC: 4.93 MIL/uL (ref 3.87–5.11)
RDW: 15.4 % (ref 11.5–15.5)
WBC: 12.2 10*3/uL — ABNORMAL HIGH (ref 4.0–10.5)
nRBC: 0 % (ref 0.0–0.2)

## 2019-04-28 LAB — BASIC METABOLIC PANEL
Anion gap: 15 (ref 5–15)
BUN: 8 mg/dL (ref 8–23)
CO2: 27 mmol/L (ref 22–32)
Calcium: 9.8 mg/dL (ref 8.9–10.3)
Chloride: 99 mmol/L (ref 98–111)
Creatinine, Ser: 0.95 mg/dL (ref 0.44–1.00)
GFR calc Af Amer: >60 mL/min (ref >60)
GFR calc non Af Amer: 56 mL/min — ABNORMAL LOW (ref >60)
Glucose, Bld: 138 mg/dL — ABNORMAL HIGH (ref 70–99)
Potassium: 3.7 mmol/L (ref 3.5–5.1)
Sodium: 141 mmol/L (ref 135–145)

## 2019-04-28 LAB — PROTIME-INR
INR: 2.1 — ABNORMAL HIGH (ref 0.8–1.2)
Prothrombin Time: 23.2 seconds — ABNORMAL HIGH (ref 11.4–15.2)

## 2019-04-28 MED ORDER — FUROSEMIDE 40 MG PO TABS
80.0000 mg | ORAL_TABLET | Freq: Every day | ORAL | Status: DC
  Administered 2019-04-28 – 2019-04-30 (×3): 80 mg via ORAL
  Filled 2019-04-28: qty 4
  Filled 2019-04-28 (×2): qty 2

## 2019-04-28 MED ORDER — CITALOPRAM HYDROBROMIDE 20 MG PO TABS
20.0000 mg | ORAL_TABLET | Freq: Two times a day (BID) | ORAL | Status: DC
  Administered 2019-04-28 – 2019-04-30 (×4): 20 mg via ORAL
  Filled 2019-04-28 (×4): qty 1

## 2019-04-28 MED ORDER — WARFARIN - PHYSICIAN DOSING INPATIENT: Freq: Every day | Status: DC

## 2019-04-28 MED ORDER — ALLOPURINOL 300 MG PO TABS
300.0000 mg | ORAL_TABLET | Freq: Every day | ORAL | Status: DC
  Administered 2019-04-28 – 2019-04-29 (×2): 300 mg via ORAL
  Filled 2019-04-28 (×3): qty 1

## 2019-04-28 MED ORDER — CELECOXIB 200 MG PO CAPS
200.0000 mg | ORAL_CAPSULE | ORAL | Status: DC
  Administered 2019-04-29 – 2019-04-30 (×2): 200 mg via ORAL
  Filled 2019-04-28 (×2): qty 1

## 2019-04-28 MED ORDER — ALBUTEROL SULFATE HFA 108 (90 BASE) MCG/ACT IN AERS
2.0000 | INHALATION_SPRAY | Freq: Four times a day (QID) | RESPIRATORY_TRACT | Status: DC | PRN
  Filled 2019-04-28: qty 6.7

## 2019-04-28 MED ORDER — CLOPIDOGREL BISULFATE 75 MG PO TABS
75.0000 mg | ORAL_TABLET | Freq: Every day | ORAL | Status: DC
  Administered 2019-04-28 – 2019-04-30 (×3): 75 mg via ORAL
  Filled 2019-04-28 (×3): qty 1

## 2019-04-28 MED ORDER — DONEPEZIL HCL 5 MG PO TABS
5.0000 mg | ORAL_TABLET | Freq: Every day | ORAL | Status: DC
  Administered 2019-04-28 – 2019-04-30 (×3): 5 mg via ORAL
  Filled 2019-04-28 (×3): qty 1

## 2019-04-28 MED ORDER — AMLODIPINE BESYLATE 5 MG PO TABS
5.0000 mg | ORAL_TABLET | Freq: Every day | ORAL | Status: DC
  Administered 2019-04-28 – 2019-04-29 (×2): 5 mg via ORAL
  Filled 2019-04-28 (×2): qty 1

## 2019-04-28 MED ORDER — WARFARIN SODIUM 5 MG PO TABS
5.0000 mg | ORAL_TABLET | Freq: Every day | ORAL | Status: DC
  Administered 2019-04-28 – 2019-04-29 (×2): 5 mg via ORAL
  Filled 2019-04-28 (×3): qty 1

## 2019-04-28 MED ORDER — ONDANSETRON 4 MG PO TBDP: 4.0000 mg | ORAL_TABLET | Freq: Four times a day (QID) | ORAL | Status: DC | PRN

## 2019-04-28 MED ORDER — LEVOTHYROXINE SODIUM 25 MCG PO TABS
25.0000 ug | ORAL_TABLET | ORAL | Status: DC
  Administered 2019-04-29 – 2019-04-30 (×2): 25 ug via ORAL
  Filled 2019-04-28 (×2): qty 1

## 2019-04-28 MED ORDER — METOPROLOL SUCCINATE ER 25 MG PO TB24
25.0000 mg | ORAL_TABLET | Freq: Every morning | ORAL | Status: DC
  Administered 2019-04-29 – 2019-04-30 (×2): 25 mg via ORAL
  Filled 2019-04-28 (×2): qty 1

## 2019-04-28 MED ORDER — SODIUM CHLORIDE 0.9% FLUSH
3.0000 mL | Freq: Two times a day (BID) | INTRAVENOUS | Status: DC
  Administered 2019-04-29 (×2): 3 mL via INTRAVENOUS

## 2019-04-28 MED ORDER — TRAMADOL HCL 50 MG PO TABS: 100.0000 mg | ORAL_TABLET | Freq: Four times a day (QID) | ORAL | Status: DC | PRN

## 2019-04-28 MED ORDER — SODIUM CHLORIDE 0.9 % IV SOLN: 250.0000 mL | INTRAVENOUS | Status: DC | PRN

## 2019-04-28 MED ORDER — SODIUM CHLORIDE 0.9% FLUSH: 3.0000 mL | INTRAVENOUS | Status: DC | PRN

## 2019-04-28 MED ORDER — LOSARTAN POTASSIUM 50 MG PO TABS
25.0000 mg | ORAL_TABLET | Freq: Every day | ORAL | Status: DC
  Administered 2019-04-28 – 2019-04-30 (×3): 25 mg via ORAL
  Filled 2019-04-28 (×3): qty 1

## 2019-04-28 MED ORDER — NITROGLYCERIN 0.4 MG SL SUBL: 0.4000 mg | SUBLINGUAL_TABLET | SUBLINGUAL | Status: DC | PRN

## 2019-04-28 MED ORDER — TRAMADOL HCL 50 MG PO TABS
50.0000 mg | ORAL_TABLET | Freq: Four times a day (QID) | ORAL | Status: DC | PRN
  Administered 2019-04-28 – 2019-04-29 (×2): 50 mg via ORAL
  Filled 2019-04-28 (×2): qty 1

## 2019-04-28 MED ORDER — POTASSIUM CHLORIDE CRYS ER 20 MEQ PO TBCR
20.0000 meq | EXTENDED_RELEASE_TABLET | Freq: Every morning | ORAL | Status: DC
  Administered 2019-04-29 – 2019-04-30 (×2): 20 meq via ORAL
  Filled 2019-04-28 (×2): qty 1

## 2019-04-28 MED ORDER — MEMANTINE HCL 10 MG PO TABS
10.0000 mg | ORAL_TABLET | Freq: Two times a day (BID) | ORAL | Status: DC
  Administered 2019-04-28 – 2019-04-30 (×4): 10 mg via ORAL
  Filled 2019-04-28 (×5): qty 1

## 2019-04-28 MED ORDER — ONDANSETRON HCL 4 MG/2ML IJ SOLN: 4.0000 mg | Freq: Four times a day (QID) | INTRAMUSCULAR | Status: DC | PRN

## 2019-04-28 NOTE — ED Notes (Signed)
Pt transported to CT ?

## 2019-04-28 NOTE — Consult Note (Signed)
Chief Complaint   Chief Complaint  Patient presents with  . Fall    HPI   Consult requested by: Dr Grandville Silos, Trauma Reason for consult: thoracic and lumbar compression fractures  HPI: Amber [name] is a 85 y.o. female with multiple medical comorbities including prior CVA w/ residual left sided weakness and left foot inversion on plavix, vertebral artery stenosis on coumadin who presented to the ED after a fall. Daughter at bedside assists with history. Patient reportedly was going downstairs to change laundry when daughter heard her fall. Found at base of steps. Presumed to fall about 12 steps. Did hit head - noticeable right frontal hematoma, No LOC. She underwent work up by EDP and was found to have multiple rib fractures and multiple acute on chronic thoracic and lumbar compression fractures. A NSY consultation was requested. Patient is current admitted under trauma service. She complains of fairly mild thoracic and lumbar back pain. No pain in BLE. Stable left sided weakness without worsening/new deficits.  Patient Active Problem List   Diagnosis Date Noted  . Fall (on) (from) other stairs and steps, initial encounter 04/28/2019  . Left rib fracture 04/28/2019  . Compression fracture of body of thoracic vertebra ([place]) 04/28/2019  . Acute pulmonary edema (HCC)   . Palliative care encounter   . Acute on chronic diastolic (congestive) heart failure ([place]) 03/04/2017  . COPD with acute exacerbation ([place]) 02/06/2017  . Acute on chronic respiratory failure with hypoxia ([place]) 02/06/2017  . Dementia ([place]) 02/06/2017  . Encounter for therapeutic drug monitoring 10/20/2016  . Cerebrovascular disease 05/21/2016  . Hemiparesis and alteration of sensations as late effects of stroke ([place]) 01/17/2015  . Memory change 01/23/2014  . 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 Long term current use of anticoagulant therapy   . Streptococcal pneumonia ([place]) 02/12/2013  . Vertebral artery stenosis  12/18/2011  . Fibromyalgia 05/10/2010  . Depression 05/10/2010  . Hypothyroidism 05/10/2010  . Osteoporosis 05/10/2010  . Coronary artery disease involving coronary bypass graft of native heart with angina pectoris ([place]) 05/10/2010  . Chronic diastolic heart failure ([place])   . Dyslipidemia   . Atrial fibrillation ([place]) 05/06/2010    PMH: Past Medical History:  Diagnosis Date  . Abnormality of gait 08/02/2014  . Cerebrovascular disease 05/21/2016  . Cervical spinal stenosis   . Chronic atrial fibrillation (HCC)    a. chronic/rate controlled;  b. chronic coumadin.  . Chronic diastolic CHF (congestive heart failure) ([place])    a. 02/2013 Echo: EF 55-60%, mild AI, mod dil LA.  . CKD (chronic kidney disease), stage III   . COPD with emphysema ([place])    PFT 05/02/10>>FEV1 1.35(62%), FEV1% 66, DLCO 75%  . Coronary artery disease    a. s/p CABG;  b. Abnormal nuc 2015 - managed medically.  . CVA (cerebral vascular accident) ([place])    left sided weakness  . Depression   . Fibromyalgia   . GERD (gastroesophageal reflux disease)    pepcid 2-3 times per week  . Gout   . Hemiparesis and alteration of sensations as late effects of stroke ([place]) 01/17/2015  . History of melanoma    squamous cell, melanoma  . Hyperlipidemia    a. statin intolerant, not felt to be candidate for PCSK9 due to chronically elevated CK levels.  . Hypertension   . Hypertensive heart disease   . Hypothyroidism   . Insomnia   . Memory change 01/23/2014  . Nasal polyposis   . Osteoarthritis   . Osteoporosis   .  Pneumonia    1990  . Tobacco abuse     PSH: Past Surgical History:  Procedure Laterality Date  . ABDOMINAL HYSTERECTOMY    . BREAST LUMPECTOMY  1980s   Benign lesion - right  . carpel tunnel     right  . CORONARY ARTERY BYPASS GRAFT  2000  . EYE SURGERY     bilateral cataracts  . IR GENERIC HISTORICAL  03/31/2016   IR RADIOLOGIST EVAL & MGMT 03/31/2016 [facility]INTER[facility] RAD  . RADIOLOGY WITH ANESTHESIA   12/24/2011   Procedure: RADIOLOGY WITH ANESTHESIA;  Surgeon: Medication Radiologist, MD;  Location: [facility];  Service: Radiology;  Laterality: N/A;  Extra Cranial Vascular Stent  . RADIOLOGY WITH ANESTHESIA N/A 07/19/2014   Procedure: ANGIOPLASTY;  Surgeon: Luanne Bras, MD;  Location: [facility];  Service: Radiology;  Laterality: N/A;  . RADIOLOGY WITH ANESTHESIA N/A 07/27/2014   Procedure: ANGIOPLASTY;  Surgeon: Luanne Bras, MD;  Location: [facility];  Service: Radiology;  Laterality: N/A;  . TONSILLECTOMY      (Not in a hospital admission)   SH: Social History   Tobacco Use  . Smoking status: Former Smoker    Packs/day: 0.40    Years: 25.00    Pack years: 10.00    Types: Cigarettes    Quit date: 02/10/1977    Years since quitting: 42.2  . Smokeless tobacco: Never Used  Substance Use Topics  . Alcohol use: No  . Drug use: No    MEDS: Prior to Admission medications   Medication Sig Start Date End Date Taking? Authorizing Provider  allopurinol (ZYLOPRIM) 300 MG tablet Take 300 mg by mouth at bedtime.    Yes [provider]  amLODipine (NORVASC) 5 MG tablet TAKE 1 TABLET BY MOUTH EVERYDAY AT BEDTIME Patient taking differently: Take 5 mg by mouth at bedtime.  10/08/18  Yes Belva Crome, MD  celecoxib (CELEBREX) 200 MG capsule Take 200 mg by mouth every morning.    Yes [provider]  citalopram (CELEXA) 20 MG tablet TAKE 1 TABLET BY MOUTH TWICE A DAY Patient taking differently: Take 20 mg by mouth 2 (two) times daily.  11/09/14  Yes Darlin Coco, MD  clopidogrel (PLAVIX) 75 MG tablet TAKE 1 TABLET (75 MG TOTAL) BY MOUTH DAILY. 12/29/18  Yes Belva Crome, MD  COUMADIN 5 MG tablet TAKE 1 TO 1 &1/2 TABLETS DAILY AS DIRECTED Patient taking differently: Take 5 mg by mouth at bedtime.  05/28/18  Yes Belva Crome, MD  diphenoxylate-atropine (LOMOTIL) 2.5-0.025 MG tablet Take 1 tablet by mouth daily as needed for diarrhea or loose stools.   Yes [provider]  donepezil (ARICEPT) 5 MG tablet Take 1 tablet (5 mg total) by mouth daily. 04/19/19  Yes Suzzanne Cloud, NP  ergocalciferol (VITAMIN D2) 50000 UNITS capsule Take 50,000 Units by mouth 2 (two) times a week. Tuesday and Friday   Yes [provider]  furosemide (LASIX) 80 MG tablet Take 80 mg by mouth daily.    Yes [provider]  levothyroxine (SYNTHROID, LEVOTHROID) 25 MCG tablet Take 25 mcg by mouth every morning.    Yes [provider]  losartan (COZAAR) 25 MG tablet TAKE 1 TABLET BY MOUTH EVERY DAY Patient taking differently: Take 25 mg by mouth daily.  10/04/18  Yes Burtis Junes, NP  memantine (NAMENDA) 10 MG tablet Take 1 tablet (10 mg total) by mouth 2 (two) times daily. 04/19/19  Yes Suzzanne Cloud, NP  metoprolol  succinate (TOPROL-XL) 25 MG 24 hr tablet TAKE 1 TABLET BY MOUTH EVERY DAY Patient taking differently: Take 25 mg by mouth every morning.  10/11/18  Yes Belva Crome, MD  nitroGLYCERIN (NITROSTAT) 0.4 MG SL tablet Place 1 tablet (0.4 mg total) under the tongue every 5 (five) minutes as needed for chest pain. 09/13/12  Yes Darlin Coco, MD  potassium chloride SA (KLOR-CON M20) 20 MEQ tablet TAKE 1 TABLET EVERY OTHER DAY, ALTERNATING WITH 2 TABLETS EVERY OTHER DAY Patient taking differently: Take 20 mEq by mouth every morning.  12/31/18  Yes Belva Crome, MD  PROAIR HFA 108 (90 BASE) MCG/ACT inhaler INHALE 2 PUFFS INTO LUNGS EVERY 6 HOURS AS NEEDED FOR WHEEZING Patient taking differently: Inhale 2 puffs into the lungs every 6 (six) hours as needed for wheezing or shortness of breath.  12/22/13  Yes Darlin Coco, MD  traMADol (ULTRAM) 50 MG tablet Take 50 mg by mouth every 6 (six) hours as needed for pain. 04/02/19  Yes [provider]  ZINC-VITAMIN C PO Take 1 tablet by mouth every evening.   Yes [provider]  Respiratory Therapy Supplies (FLUTTER) DEVI Use as directed. 03/19/17   Parrett, Fonnie Mu, NP     ALLERGY: Allergies  Allergen Reactions  . Ezetimibe Other (See Comments)    Myalgia  . Welchol [Colesevelam Hcl] Other (See Comments)    Muscle aches  . Adhesive [Tape] Itching, Rash and Other (See Comments)    Burning   . Ceclor [Cefaclor] Other (See Comments)    Unknown allergic reaction  . Elastic Bandages & [Zinc] Rash and Other (See Comments)    Turns red on the areas it touches  . Latex Itching, Rash and Other (See Comments)    Burning   . Lipitor [Atorvastatin Calcium] Other (See Comments)    Increased fibromyalgia pain  . Mevacor [Lovastatin] Other (See Comments)    Increased fibromyalgia pain  . Pravachol Other (See Comments)    Increased fibromyalgia pain  . Vasotec Other (See Comments)    Unknown allergic reaction    Social History   Tobacco Use  . Smoking status: Former Smoker    Packs/day: 0.40    Years: 25.00    Pack years: 10.00    Types: Cigarettes    Quit date: 02/10/1977    Years since quitting: 42.2  . Smokeless tobacco: Never Used  Substance Use Topics  . Alcohol use: No     Family History  Problem Relation Age of Onset  . Stroke Mother   . CAD Father   . Diabetes type II Brother   . CAD Brother      ROS   Review of Systems  Constitutional: Negative.   HENT: Negative.   Eyes: Negative.  Negative for blurred vision, double vision and photophobia.  Respiratory: Negative.   Cardiovascular: Negative.   Gastrointestinal: Negative for nausea and vomiting.  Genitourinary: Negative.   Musculoskeletal: Positive for back pain, falls, joint pain and myalgias.  Skin: Negative.   Neurological: Negative for dizziness, tingling, tremors, sensory change, speech change, focal weakness, seizures, weakness and headaches.    Exam   Vitals:   04/28/19 1515 04/28/19 1530  BP: (!) 179/147 (!) 156/88  Pulse: 97 (!) 128  Resp:    Temp:    SpO2: 95% 94%   General appearance: elderly female, resting comfortable. right frontal hematoma, multiple  bruises BUE, BLE Eyes: No scleral injection Cardiovascular: Regular rate and rhythm without murmurs, rubs, gallops. No  edema or variciosities. Distal pulses normal. Pulmonary: Effort normal, non-labored breathing Musculoskeletal:     Muscle tone upper extremities: Normal    Muscle tone lower extremities: Normal    Motor exam: Rue/RLE 5/5 LUE 4/5, LLE 4/5, right foot inverted   Neurological Mental Status:    - Patient is awake, alert, oriented to person, place, month, year, and situation    - Patient is unable to give a clear and coherent history regarding the event    - No signs of aphasia or neglect Cranial Nerves    - II: Visual Fields are full. PERRL    - III/IV/VI: EOMI without ptosis or diploplia.     - V: Facial sensation is grossly normal    - VII: Facial movement is symmetric.     - VIII: hearing is intact to voice    - X: Uvula elevates symmetrically    - XI: Shoulder shrug is symmetric.    - XII: tongue is midline without atrophy or fasciculations.  Sensory: Sensation grossly intact to LT  Results - Imaging/Labs   Results for orders placed or performed during the hospital encounter of 04/28/19 (from the past 48 hour(s))  CBG monitoring, ED     Status: Abnormal   Collection Time: 04/28/19 11:45 AM  Result Value Ref Range   Glucose-Capillary 122 (H) 70 - 99 mg/dL    Comment: Glucose reference range applies only to samples taken after fasting for at least 8 hours.   Comment 1 Notify RN    Comment 2 Document in Chart   Basic metabolic panel     Status: Abnormal   Collection Time: 04/28/19 11:49 AM  Result Value Ref Range   Sodium 141 135 - 145 mmol/L   Potassium 3.7 3.5 - 5.1 mmol/L   Chloride 99 98 - 111 mmol/L   CO2 27 22 - 32 mmol/L   Glucose, Bld 138 (H) 70 - 99 mg/dL    Comment: Glucose reference range applies only to samples taken after fasting for at least 8 hours.   BUN 8 8 - 23 mg/dL   Creatinine, Ser 0.95 0.44 - 1.00 mg/dL   Calcium 9.8 8.9 - 10.3 mg/dL    GFR calc non Af Amer 56 (L) >60 mL/min   GFR calc Af Amer >60 >60 mL/min   Anion gap 15 5 - 15    Comment: Performed at [facility] [address] [place], [place] [address]  CBC with Differential     Status: Abnormal   Collection Time: 04/28/19 11:49 AM  Result Value Ref Range   WBC 12.2 (H) 4.0 - 10.5 K/uL   RBC 4.93 3.87 - 5.11 MIL/uL   Hemoglobin 14.5 12.0 - 15.0 g/dL   HCT 47.7 (H) 36.0 - 46.0 %   MCV 96.8 80.0 - 100.0 fL   MCH 29.4 26.0 - 34.0 pg   MCHC 30.4 30.0 - 36.0 g/dL   RDW 15.4 11.5 - 15.5 %   Platelets 228 150 - 400 K/uL   nRBC 0.0 0.0 - 0.2 %   Neutrophils Relative % 78 %   Neutro Abs 9.8 (H) 1.7 - 7.7 K/uL   Lymphocytes Relative 13 %   Lymphs Abs 1.6 0.7 - 4.0 K/uL   Monocytes Relative 6 %   Monocytes Absolute 0.7 0.1 - 1.0 K/uL   Eosinophils Relative 1 %   Eosinophils Absolute 0.1 0.0 - 0.5 K/uL   Basophils Relative 1 %   Basophils Absolute 0.1 0.0 -  0.1 K/uL   Immature Granulocytes 1 %   Abs Immature Granulocytes 0.07 0.00 - 0.07 K/uL    Comment: Performed at [facility], [place] [address], [place], [place] [address]  Protime-INR     Status: Abnormal   Collection Time: 04/28/19 11:49 AM  Result Value Ref Range   Prothrombin Time 23.2 (H) 11.4 - 15.2 seconds   INR 2.1 (H) 0.8 - 1.2    Comment: (NOTE) INR goal varies based on device and disease states. Performed at [facility], [place] [address] [place], [place] [address]     DG Ribs Unilateral W/Chest Left  Result Date: 04/28/2019 CLINICAL DATA:  Pain following fall EXAM: LEFT RIBS AND CHEST - 3+ VIEW COMPARISON:  Chest radiograph March 19, 2017 FINDINGS: Frontal chest as well as oblique and cone-down rib images obtained. There is scarring in the lateral left base with questionable small chronic pleural effusion. There is slight atelectasis in the left mid lung. Lungs elsewhere clear. Heart is upper normal in size with pulmonary vascularity normal. Patient is status post internal  mammary bypass grafting. There is aortic atherosclerosis. There are fractures of the anterior left fifth, sixth, seventh, eighth, ninth, and tenth ribs. No evident pneumothorax. IMPRESSION: Multiple mildly displaced left rib fractures. Chronic blunting of the left costophrenic angle with questionable small pleural effusion. No pneumothorax. Lungs are otherwise clear except for mild left midlung atelectasis. Stable cardiac silhouette with postoperative changes. Aortic Atherosclerosis (ICD10-I70.0). Electronically Signed   By: Lowella Grip III M.D.   On: 04/28/2019 12:47   DG Thoracic Spine 2 View  Result Date: 04/28/2019 CLINICAL DATA:  Generalized back pain, fall EXAM: THORACIC SPINE 2 VIEWS COMPARISON:  03/19/2017, 07/31/2016, 10/29/2012 FINDINGS: Moderate to severe compression deformities of the T12 and L1 vertebral bodies, unchanged from 2014. Moderate compression deformity of T7, unchanged from 2018. Mild superior endplate compression deformity of T9 appears new from prior 03/19/2017. No static listhesis. Median sternotomy changes. Calcific aortic knob. Visualized lung fields clear. IMPRESSION: 1. Mild superior endplate compression deformity of T9 appears new from prior 03/19/2017. Correlate with point tenderness at this level. 2. Moderate to severe compression deformities of T12 and L1 vertebral bodies, unchanged from 2014. 3. Moderate compression deformity of T7, unchanged from 2018. Electronically Signed   By: Davina Poke D.O.   On: 04/28/2019 13:36   DG Ankle Complete Right  Result Date: 04/28/2019 CLINICAL DATA:  Pain following fall EXAM: RIGHT ANKLE - COMPLETE 3+ VIEW COMPARISON:  None. FINDINGS: Frontal, oblique, and lateral views were obtained. There is no fracture or appreciable joint effusion. There is moderate generalized joint space narrowing. No erosion. Ankle mortise appears intact. There is a posterior calcaneal spur. There is calcification in the soft tissues posterior to the  ankle mortise. IMPRESSION: Generalized joint space narrowing. No acute fracture. Ankle mortise appears intact. Small posterior calcaneal spur. Probable calcified hematoma in the posterior soft tissues at the ankle mortise level. Electronically Signed   By: Lowella Grip III M.D.   On: 04/28/2019 12:53   CT Head Wo Contrast  Result Date: 04/28/2019 CLINICAL DATA:  [place] down the basement stairs down approximately 15 carpeted steps landing on cement floor, on blood thinners, struck head, no loss of consciousness; history chronic atrial fibrillation, CHF, COPD, coronary artery disease, stroke, stage III chronic kidney disease, hypertension, dementia, former smoker EXAM: CT HEAD WITHOUT CONTRAST CT CERVICAL SPINE WITHOUT CONTRAST TECHNIQUE: Multidetector CT imaging of the head and cervical spine was performed following  the standard protocol without intravenous contrast. Multiplanar CT image reconstructions of the cervical spine were also generated. COMPARISON:  CT cervical spine 02/06/2017, CT head 03/05/2017 FINDINGS: CT HEAD FINDINGS Brain: Generalized atrophy with ex vacuo dilatation of the ventricles. No midline shift or mass effect. Small vessel chronic ischemic changes of deep cerebral white matter. No intracranial hemorrhage, mass lesion or evidence of acute infarction. No extra-axial fluid collections. Vascular: No hyperdense vessels. Atherosclerotic calcification of internal carotid arteries at skull base Skull: Demineralized but intact. Small high RIGHT frontal scalp hematoma. Sinuses/Orbits: Clear. Material within the external auditory canals bilaterally question cerumen. Other: N/A CT CERVICAL SPINE FINDINGS Alignment: Normal Skull base and vertebrae: Osseous demineralization. Mild superior endplate compression fracture of T2 vertebral body with 10-20% anterior height loss new since 02/06/2017. Multilevel disc space narrowing and minor endplate spur formation. Interval mild superior endplate concavity  of T3 vertebral body and minimal anterior height loss though this is age-indeterminate. Multilevel facet degenerative changes. No additional fracture, subluxation or bone destruction. Soft tissues and spinal canal: Prevertebral soft tissues normal thickness. Atherosclerotic calcifications of the carotid systems bilaterally, with the carotid arteries extending retropharyngeal bilaterally. Additional atherosclerotic calcifications at the proximal great vessels. Disc levels:  No additional abnormalities Upper chest: Lung apices clear Other: N/A IMPRESSION: Atrophy with small vessel chronic ischemic changes of deep cerebral white matter. No acute intracranial abnormalities. Mild superior endplate compression fracture of T2 vertebral body with 10-20% anterior height loss, appears acute and is new since 02/06/2017. Mild superior endplate concavity and anterior height loss of T3 vertebral body, age indeterminate. Multilevel degenerative disc and facet disease changes cervical spine. Osseous demineralization. Findings called to Suella Broad PA on 04/28/2019 at 1235 hours. Electronically Signed   By: Lavonia Dana M.D.   On: 04/28/2019 12:36   CT Chest Wo Contrast  Result Date: 04/28/2019 CLINICAL DATA:  Pain following fall EXAM: CT CHEST WITHOUT CONTRAST TECHNIQUE: Multidetector CT imaging of the chest was performed following the standard protocol without IV contrast. COMPARISON:  Chest and rib radiographs April 28, 2019; CT angiogram chest April 28, 2010. Chest radiograph March 19, 2017 FINDINGS: Cardiovascular: There is no thoracic aortic aneurysm. There is aortic atherosclerosis with calcification throughout the thoracic aorta. There is calcification in scattered areas in the visualized great vessels. Patient is status post coronary artery bypass grafting. There is no pericardial effusion or pericardial thickening. Mediastinum/Nodes: Thyroid appears unremarkable. There are scattered subcentimeter lymph nodes without  thoracic adenopathy. There is a small hiatal hernia. Lungs/Pleura: There is a small pleural effusion. There is no evident pneumothorax. There is mild tree on bud type appearance in the posterior segment of the right upper lobe. No consolidation. There is slight bibasilar atelectasis. Upper Abdomen: There is upper abdominal aortic atherosclerosis. Visualized upper abdominal structures otherwise appear unremarkable. Musculoskeletal: There is evidence of a prior fracture of the posterior left sixth rib with evidence of healing. There is evidence of a prior fracture of the posterior left seventh rib with evidence of healing. There is a acute appearing displaced fracture of the posterolateral left eighth rib. There is a fracture, mildly displaced, which appears acute involving the posterolateral left ninth rib. There is a fracture involving the lateral left tenth rib. There is an acute fracture involving the lateral left eleventh rib. There is marked wedging of the T7, T12, and L1 vertebral bodies. There is milder wedging along the superior aspect of T9. There is also slight anterior wedging at the superior endplate of T6. Patient is  status post median sternotomy. There has been progression of compression at L1, T9, and T6 compared to the previous study. Other fractures were present on prior chest radiograph from 2019. There are no blastic or lytic bone lesions. No chest wall lesions are evident. IMPRESSION: 1. Several left-sided acute rib fractures. There are also older healed rib fractures on the left. Several vertebral body fractures are noted in the thoracic and upper lumbar region with overall progression of wedging at L1 and to a lesser extent at T9 and T6. Wedge fractures at T7 and T12 were present on previous chest radiograph. 2.  Fairly small left pleural effusion.  No pneumothorax. 3. There is mild tree on bud type appearance in the posterior segment right upper lobe, felt to be indicative of a focal area of  pneumonia. No similar foci of pneumonia elsewhere. There is mild bibasilar atelectasis. 4. There is aortic atherosclerosis as well as evidence of prior coronary artery bypass grafting. 5.  Small hiatal hernia. 6.  No evident adenopathy. Aortic Atherosclerosis (ICD10-I70.0). Electronically Signed   By: Lowella Grip III M.D.   On: 04/28/2019 14:53   CT Cervical Spine Wo Contrast  Result Date: 04/28/2019 CLINICAL DATA:  [place] down the basement stairs down approximately 15 carpeted steps landing on cement floor, on blood thinners, struck head, no loss of consciousness; history chronic atrial fibrillation, CHF, COPD, coronary artery disease, stroke, stage III chronic kidney disease, hypertension, dementia, former smoker EXAM: CT HEAD WITHOUT CONTRAST CT CERVICAL SPINE WITHOUT CONTRAST TECHNIQUE: Multidetector CT imaging of the head and cervical spine was performed following the standard protocol without intravenous contrast. Multiplanar CT image reconstructions of the cervical spine were also generated. COMPARISON:  CT cervical spine 02/06/2017, CT head 03/05/2017 FINDINGS: CT HEAD FINDINGS Brain: Generalized atrophy with ex vacuo dilatation of the ventricles. No midline shift or mass effect. Small vessel chronic ischemic changes of deep cerebral white matter. No intracranial hemorrhage, mass lesion or evidence of acute infarction. No extra-axial fluid collections. Vascular: No hyperdense vessels. Atherosclerotic calcification of internal carotid arteries at skull base Skull: Demineralized but intact. Small high RIGHT frontal scalp hematoma. Sinuses/Orbits: Clear. Material within the external auditory canals bilaterally question cerumen. Other: N/A CT CERVICAL SPINE FINDINGS Alignment: Normal Skull base and vertebrae: Osseous demineralization. Mild superior endplate compression fracture of T2 vertebral body with 10-20% anterior height loss new since 02/06/2017. Multilevel disc space narrowing and minor endplate  spur formation. Interval mild superior endplate concavity of T3 vertebral body and minimal anterior height loss though this is age-indeterminate. Multilevel facet degenerative changes. No additional fracture, subluxation or bone destruction. Soft tissues and spinal canal: Prevertebral soft tissues normal thickness. Atherosclerotic calcifications of the carotid systems bilaterally, with the carotid arteries extending retropharyngeal bilaterally. Additional atherosclerotic calcifications at the proximal great vessels. Disc levels:  No additional abnormalities Upper chest: Lung apices clear Other: N/A IMPRESSION: Atrophy with small vessel chronic ischemic changes of deep cerebral white matter. No acute intracranial abnormalities. Mild superior endplate compression fracture of T2 vertebral body with 10-20% anterior height loss, appears acute and is new since 02/06/2017. Mild superior endplate concavity and anterior height loss of T3 vertebral body, age indeterminate. Multilevel degenerative disc and facet disease changes cervical spine. Osseous demineralization. Findings called to Suella Broad PA on 04/28/2019 at 1235 hours. Electronically Signed   By: Lavonia Dana M.D.   On: 04/28/2019 12:36   Impression/Plan   85 y.o. female with multiple rib fractures and thoracic/lumbar compression fractures after a fall earlier today.  She is at neurologic baseline with chronic left sided weakness from prior CVA.I have reviewed the imaging as it pertains to the spine.  She is admitted under the Trauma Service.   Acute on chronic insufficiency fractures at T6, T9, L1 - no role for NS intervention. Tx conservatively with TLSO bracing which is to be worn when upright and out of bed. - Patient's daughter did state should the patient require vertebral augmentation, she would prefer Dr Estanislado Pandy. Therefore, no need for NS f/u. She can f/u with her PCP & Dr Estanislado Pandy as needed.  Chronic T7 and T12 fractures  Low back pain:  -  L1 fracture. Will CT l spine for completeness sake, although does not change plan of care.  Please call for any concerns  Ferne Reus, [facility]

## 2019-04-28 NOTE — ED Notes (Signed)
This RN attempted to call report for this pt; RN unable to at this time; will reassess at a later time.

## 2019-04-28 NOTE — H&P (Signed)
Amber [name] 1935-01-11  [id number].    Chief Complaint/Reason for Consult: level II, fall down steps  HPI:  This is an 85 yo white female with a complex medical history including chronic A fib on coumadin, history of CVA with left-side hemiparesis on plavix, dementia, COPD, CKD, HTN, GERD, and CAD who lives at home with her husband.  Her daughter lives beside them.  This morning she put her brace on her left foot that she uses and got her walker and stood up.  The daughter then heard several loud thuds and realized she had fallen down her carpeted stairs into the basement.  She did hit her head, but the daughter states she was alert when she got to her.  The patient has pretty significant dementia and barely remembers that she even fell this morning.  She complained of some back and chest pain.  She apparently has had several falls last year as well with some old rib fractures and some compression fractures.  She has not been seen for her compression fractures.  Because the patient began to complain of pain in multiple places, the daughter decided to bring her to the hospital for evaluation.    Her her work up has revealed left-sided 8-11 posterior rib fractures along with what is suspected to be an acute T2 compression endplate fracture along with multiple other worsening and chronic compression fractures in her spine.  CT of her head was negative.  We have been asked to see her for admission for pain control and evaluation.  ROS: ROS: Unable to obtain due to dementia  Family History  Problem Relation Age of Onset  . Stroke Mother   . CAD Father   . Diabetes type II Brother   . CAD Brother     Past Medical History:  Diagnosis Date  . Abnormality of gait 08/02/2014  . Cerebrovascular disease 05/21/2016  . Cervical spinal stenosis   . Chronic atrial fibrillation (HCC)    a. chronic/rate controlled;  b. chronic coumadin.  . Chronic diastolic CHF (congestive heart failure) ([place])     a. 02/2013 Echo: EF 55-60%, mild AI, mod dil LA.  . CKD (chronic kidney disease), stage III   . COPD with emphysema ([place])    PFT 05/02/10>>FEV1 1.35(62%), FEV1% 66, DLCO 75%  . Coronary artery disease    a. s/p CABG;  b. Abnormal nuc 2015 - managed medically.  . CVA (cerebral vascular accident) ([place])    left sided weakness  . Depression   . Fibromyalgia   . GERD (gastroesophageal reflux disease)    pepcid 2-3 times per week  . Gout   . Hemiparesis and alteration of sensations as late effects of stroke ([place]) 01/17/2015  . History of melanoma    squamous cell, melanoma  . Hyperlipidemia    a. statin intolerant, not felt to be candidate for PCSK9 due to chronically elevated CK levels.  . Hypertension   . Hypertensive heart disease   . Hypothyroidism   . Insomnia   . Memory change 01/23/2014  . Nasal polyposis   . Osteoarthritis   . Osteoporosis   . Pneumonia    1990  . Tobacco abuse     Past Surgical History:  Procedure Laterality Date  . ABDOMINAL HYSTERECTOMY    . BREAST LUMPECTOMY  1980s   Benign lesion - right  . carpel tunnel     right  . CORONARY ARTERY BYPASS GRAFT  2000  . EYE SURGERY  bilateral cataracts  . IR GENERIC HISTORICAL  03/31/2016   IR RADIOLOGIST EVAL & MGMT 03/31/2016 [facility]INTERV RAD  . RADIOLOGY WITH ANESTHESIA  12/24/2011   Procedure: RADIOLOGY WITH ANESTHESIA;  Surgeon: Medication Radiologist, MD;  Location: [place];  Service: Radiology;  Laterality: N/A;  Extra Cranial Vascular Stent  . RADIOLOGY WITH ANESTHESIA N/A 07/19/2014   Procedure: ANGIOPLASTY;  Surgeon: Luanne Bras, MD;  Location: [place];  Service: Radiology;  Laterality: N/A;  . RADIOLOGY WITH ANESTHESIA N/A 07/27/2014   Procedure: ANGIOPLASTY;  Surgeon: Luanne Bras, MD;  Location: [place];  Service: Radiology;  Laterality: N/A;  . TONSILLECTOMY      Social History:  reports that she quit smoking about 42 years ago. Her smoking use included cigarettes. She has a 10.00 pack-year  smoking history. She has never used smokeless tobacco. She reports that she does not drink alcohol or use drugs.  Allergies:  Allergies  Allergen Reactions  . Ezetimibe Other (See Comments)    Myalgia  . Welchol [Colesevelam Hcl] Other (See Comments)    Muscle aches  . Adhesive [Tape] Itching, Rash and Other (See Comments)    Burning   . Ceclor [Cefaclor] Other (See Comments)    Unknown allergic reaction  . Elastic Bandages & [Zinc] Rash and Other (See Comments)    Turns red on the areas it touches  . Latex Itching, Rash and Other (See Comments)    Burning   . Lipitor [Atorvastatin Calcium] Other (See Comments)    Increased fibromyalgia pain  . Mevacor [Lovastatin] Other (See Comments)    Increased fibromyalgia pain  . Pravachol Other (See Comments)    Increased fibromyalgia pain  . Vasotec Other (See Comments)    Unknown allergic reaction    (Not in a hospital admission)    Physical Exam: Blood pressure (!) 156/88, pulse (!) 128, temperature 98.4 F (36.9 C), temperature source Oral, resp. rate 16, height 5\' 5"  (1.651 m), weight 67.6 kg, SpO2 94 %. General: pleasant, elderly white female who is laying in bed in NAD HEENT: head is normocephalic, with ecchymosis of the right parietal/frontal scalp.  Sclera are noninjected.  PERRL, but only about 61mm in size.  Ears and nose without any masses or lesions.  Mouth is pink and moist Neck: trachea midline, no thyromegaly Heart: irregularly irregular.  Normal s1,s2. No obvious murmurs, gallops, or rubs noted.  Palpable radial and pedal pulses bilaterally Lungs: CTAB, no wheezes, rhonchi, or rales noted.  Respiratory effort nonlabored.  Tender in posterior left chest wall Abd: soft, NT, ND, +BS, no masses, hernias, or organomegaly as best as I can tell as she already has a TLSO brace in place. GU: purewick in place MS: all 4 extremities are symmetrical with no cyanosis, clubbing, or edema.  She does have several ecchymoses noted on  her legs.  Mildly tender along thoracic midline.  No cervical midline tenderness.  Normal ROM of neck Skin: warm and dry with no masses, lesions, or rashes except small abrasion on right superior shoulder Neuro: Cranial nerves 2-12 grossly intact, sensation is normal throughout Psych: alert, oriented to self and place, but does not recall why she is here and unable to provide any history secondary to dementia   Results for orders placed or performed during the hospital encounter of 04/28/19 (from the past 48 hour(s))  CBG monitoring, ED     Status: Abnormal   Collection Time: 04/28/19 11:45 AM  Result Value Ref Range   Glucose-Capillary 122 (H) 70 -  99 mg/dL    Comment: Glucose reference range applies only to samples taken after fasting for at least 8 hours.   Comment 1 Notify RN    Comment 2 Document in Chart   Basic metabolic panel     Status: Abnormal   Collection Time: 04/28/19 11:49 AM  Result Value Ref Range   Sodium 141 135 - 145 mmol/L   Potassium 3.7 3.5 - 5.1 mmol/L   Chloride 99 98 - 111 mmol/L   CO2 27 22 - 32 mmol/L   Glucose, Bld 138 (H) 70 - 99 mg/dL    Comment: Glucose reference range applies only to samples taken after fasting for at least 8 hours.   BUN 8 8 - 23 mg/dL   Creatinine, Ser 0.95 0.44 - 1.00 mg/dL   Calcium 9.8 8.9 - 10.3 mg/dL   GFR calc non Af Amer 56 (L) >60 mL/min   GFR calc Af Amer >60 >60 mL/min   Anion gap 15 5 - 15    Comment: Performed at [facility] [address] [place], [place] [address]  CBC with Differential     Status: Abnormal   Collection Time: 04/28/19 11:49 AM  Result Value Ref Range   WBC 12.2 (H) 4.0 - 10.5 K/uL   RBC 4.93 3.87 - 5.11 MIL/uL   Hemoglobin 14.5 12.0 - 15.0 g/dL   HCT 47.7 (H) 36.0 - 46.0 %   MCV 96.8 80.0 - 100.0 fL   MCH 29.4 26.0 - 34.0 pg   MCHC 30.4 30.0 - 36.0 g/dL   RDW 15.4 11.5 - 15.5 %   Platelets 228 150 - 400 K/uL   nRBC 0.0 0.0 - 0.2 %   Neutrophils Relative % 78 %   Neutro Abs 9.8 (H)  1.7 - 7.7 K/uL   Lymphocytes Relative 13 %   Lymphs Abs 1.6 0.7 - 4.0 K/uL   Monocytes Relative 6 %   Monocytes Absolute 0.7 0.1 - 1.0 K/uL   Eosinophils Relative 1 %   Eosinophils Absolute 0.1 0.0 - 0.5 K/uL   Basophils Relative 1 %   Basophils Absolute 0.1 0.0 - 0.1 K/uL   Immature Granulocytes 1 %   Abs Immature Granulocytes 0.07 0.00 - 0.07 K/uL    Comment: Performed at [facility] [address], [place], [place] [address]  Protime-INR     Status: Abnormal   Collection Time: 04/28/19 11:49 AM  Result Value Ref Range   Prothrombin Time 23.2 (H) 11.4 - 15.2 seconds   INR 2.1 (H) 0.8 - 1.2    Comment: (NOTE) INR goal varies based on device and disease states. Performed at [facility], [place] [address] [place],  [address]    DG Ribs Unilateral W/Chest Left  Result Date: 04/28/2019 CLINICAL DATA:  Pain following fall EXAM: LEFT RIBS AND CHEST - 3+ VIEW COMPARISON:  Chest radiograph March 19, 2017 FINDINGS: Frontal chest as well as oblique and cone-down rib images obtained. There is scarring in the lateral left base with questionable small chronic pleural effusion. There is slight atelectasis in the left mid lung. Lungs elsewhere clear. Heart is upper normal in size with pulmonary vascularity normal. Patient is status post internal mammary bypass grafting. There is aortic atherosclerosis. There are fractures of the anterior left fifth, sixth, seventh, eighth, ninth, and tenth ribs. No evident pneumothorax. IMPRESSION: Multiple mildly displaced left rib fractures. Chronic blunting of the left costophrenic angle with questionable small pleural effusion. No  pneumothorax. Lungs are otherwise clear except for mild left midlung atelectasis. Stable cardiac silhouette with postoperative changes. Aortic Atherosclerosis (ICD10-I70.0). Electronically Signed   By: Lowella Grip III M.D.   On: 04/28/2019 12:47   DG Thoracic Spine 2 View  Result Date: 04/28/2019 CLINICAL  DATA:  Generalized back pain, fall EXAM: THORACIC SPINE 2 VIEWS COMPARISON:  03/19/2017, 07/31/2016, 10/29/2012 FINDINGS: Moderate to severe compression deformities of the T12 and L1 vertebral bodies, unchanged from 2014. Moderate compression deformity of T7, unchanged from 2018. Mild superior endplate compression deformity of T9 appears new from prior 03/19/2017. No static listhesis. Median sternotomy changes. Calcific aortic knob. Visualized lung fields clear. IMPRESSION: 1. Mild superior endplate compression deformity of T9 appears new from prior 03/19/2017. Correlate with point tenderness at this level. 2. Moderate to severe compression deformities of T12 and L1 vertebral bodies, unchanged from 2014. 3. Moderate compression deformity of T7, unchanged from 2018. Electronically Signed   By: Davina Poke D.O.   On: 04/28/2019 13:36   DG Ankle Complete Right  Result Date: 04/28/2019 CLINICAL DATA:  Pain following fall EXAM: RIGHT ANKLE - COMPLETE 3+ VIEW COMPARISON:  None. FINDINGS: Frontal, oblique, and lateral views were obtained. There is no fracture or appreciable joint effusion. There is moderate generalized joint space narrowing. No erosion. Ankle mortise appears intact. There is a posterior calcaneal spur. There is calcification in the soft tissues posterior to the ankle mortise. IMPRESSION: Generalized joint space narrowing. No acute fracture. Ankle mortise appears intact. Small posterior calcaneal spur. Probable calcified hematoma in the posterior soft tissues at the ankle mortise level. Electronically Signed   By: Lowella Grip III M.D.   On: 04/28/2019 12:53   CT Head Wo Contrast  Result Date: 04/28/2019 CLINICAL DATA:  [place] down the basement stairs down approximately 15 carpeted steps landing on cement floor, on blood thinners, struck head, no loss of consciousness; history chronic atrial fibrillation, CHF, COPD, coronary artery disease, stroke, stage III chronic kidney disease,  hypertension, dementia, former smoker EXAM: CT HEAD WITHOUT CONTRAST CT CERVICAL SPINE WITHOUT CONTRAST TECHNIQUE: Multidetector CT imaging of the head and cervical spine was performed following the standard protocol without intravenous contrast. Multiplanar CT image reconstructions of the cervical spine were also generated. COMPARISON:  CT cervical spine 02/06/2017, CT head 03/05/2017 FINDINGS: CT HEAD FINDINGS Brain: Generalized atrophy with ex vacuo dilatation of the ventricles. No midline shift or mass effect. Small vessel chronic ischemic changes of deep cerebral white matter. No intracranial hemorrhage, mass lesion or evidence of acute infarction. No extra-axial fluid collections. Vascular: No hyperdense vessels. Atherosclerotic calcification of internal carotid arteries at skull base Skull: Demineralized but intact. Small high RIGHT frontal scalp hematoma. Sinuses/Orbits: Clear. Material within the external auditory canals bilaterally question cerumen. Other: N/A CT CERVICAL SPINE FINDINGS Alignment: Normal Skull base and vertebrae: Osseous demineralization. Mild superior endplate compression fracture of T2 vertebral body with 10-20% anterior height loss new since 02/06/2017. Multilevel disc space narrowing and minor endplate spur formation. Interval mild superior endplate concavity of T3 vertebral body and minimal anterior height loss though this is age-indeterminate. Multilevel facet degenerative changes. No additional fracture, subluxation or bone destruction. Soft tissues and spinal canal: Prevertebral soft tissues normal thickness. Atherosclerotic calcifications of the carotid systems bilaterally, with the carotid arteries extending retropharyngeal bilaterally. Additional atherosclerotic calcifications at the proximal great vessels. Disc levels:  No additional abnormalities Upper chest: Lung apices clear Other: N/A IMPRESSION: Atrophy with small vessel chronic ischemic changes of deep cerebral white  matter.  No acute intracranial abnormalities. Mild superior endplate compression fracture of T2 vertebral body with 10-20% anterior height loss, appears acute and is new since 02/06/2017. Mild superior endplate concavity and anterior height loss of T3 vertebral body, age indeterminate. Multilevel degenerative disc and facet disease changes cervical spine. Osseous demineralization. Findings called to Suella Broad PA on 04/28/2019 at 1235 hours. Electronically Signed   By: Lavonia Dana M.D.   On: 04/28/2019 12:36   CT Chest Wo Contrast  Result Date: 04/28/2019 CLINICAL DATA:  Pain following fall EXAM: CT CHEST WITHOUT CONTRAST TECHNIQUE: Multidetector CT imaging of the chest was performed following the standard protocol without IV contrast. COMPARISON:  Chest and rib radiographs April 28, 2019; CT angiogram chest April 28, 2010. Chest radiograph March 19, 2017 FINDINGS: Cardiovascular: There is no thoracic aortic aneurysm. There is aortic atherosclerosis with calcification throughout the thoracic aorta. There is calcification in scattered areas in the visualized great vessels. Patient is status post coronary artery bypass grafting. There is no pericardial effusion or pericardial thickening. Mediastinum/Nodes: Thyroid appears unremarkable. There are scattered subcentimeter lymph nodes without thoracic adenopathy. There is a small hiatal hernia. Lungs/Pleura: There is a small pleural effusion. There is no evident pneumothorax. There is mild tree on bud type appearance in the posterior segment of the right upper lobe. No consolidation. There is slight bibasilar atelectasis. Upper Abdomen: There is upper abdominal aortic atherosclerosis. Visualized upper abdominal structures otherwise appear unremarkable. Musculoskeletal: There is evidence of a prior fracture of the posterior left sixth rib with evidence of healing. There is evidence of a prior fracture of the posterior left seventh rib with evidence of healing. There  is a acute appearing displaced fracture of the posterolateral left eighth rib. There is a fracture, mildly displaced, which appears acute involving the posterolateral left ninth rib. There is a fracture involving the lateral left tenth rib. There is an acute fracture involving the lateral left eleventh rib. There is marked wedging of the T7, T12, and L1 vertebral bodies. There is milder wedging along the superior aspect of T9. There is also slight anterior wedging at the superior endplate of T6. Patient is status post median sternotomy. There has been progression of compression at L1, T9, and T6 compared to the previous study. Other fractures were present on prior chest radiograph from 2019. There are no blastic or lytic bone lesions. No chest wall lesions are evident. IMPRESSION: 1. Several left-sided acute rib fractures. There are also older healed rib fractures on the left. Several vertebral body fractures are noted in the thoracic and upper lumbar region with overall progression of wedging at L1 and to a lesser extent at T9 and T6. Wedge fractures at T7 and T12 were present on previous chest radiograph. 2.  Fairly small left pleural effusion.  No pneumothorax. 3. There is mild tree on bud type appearance in the posterior segment right upper lobe, felt to be indicative of a focal area of pneumonia. No similar foci of pneumonia elsewhere. There is mild bibasilar atelectasis. 4. There is aortic atherosclerosis as well as evidence of prior coronary artery bypass grafting. 5.  Small hiatal hernia. 6.  No evident adenopathy. Aortic Atherosclerosis (ICD10-I70.0). Electronically Signed   By: Lowella Grip III M.D.   On: 04/28/2019 14:53   CT Cervical Spine Wo Contrast  Result Date: 04/28/2019 CLINICAL DATA:  [place] down the basement stairs down approximately 15 carpeted steps landing on cement floor, on blood thinners, struck head, no loss of consciousness; history chronic atrial  fibrillation, CHF, COPD, coronary  artery disease, stroke, stage III chronic kidney disease, hypertension, dementia, former smoker EXAM: CT HEAD WITHOUT CONTRAST CT CERVICAL SPINE WITHOUT CONTRAST TECHNIQUE: Multidetector CT imaging of the head and cervical spine was performed following the standard protocol without intravenous contrast. Multiplanar CT image reconstructions of the cervical spine were also generated. COMPARISON:  CT cervical spine 02/06/2017, CT head 03/05/2017 FINDINGS: CT HEAD FINDINGS Brain: Generalized atrophy with ex vacuo dilatation of the ventricles. No midline shift or mass effect. Small vessel chronic ischemic changes of deep cerebral white matter. No intracranial hemorrhage, mass lesion or evidence of acute infarction. No extra-axial fluid collections. Vascular: No hyperdense vessels. Atherosclerotic calcification of internal carotid arteries at skull base Skull: Demineralized but intact. Small high RIGHT frontal scalp hematoma. Sinuses/Orbits: Clear. Material within the external auditory canals bilaterally question cerumen. Other: N/A CT CERVICAL SPINE FINDINGS Alignment: Normal Skull base and vertebrae: Osseous demineralization. Mild superior endplate compression fracture of T2 vertebral body with 10-20% anterior height loss new since 02/06/2017. Multilevel disc space narrowing and minor endplate spur formation. Interval mild superior endplate concavity of T3 vertebral body and minimal anterior height loss though this is age-indeterminate. Multilevel facet degenerative changes. No additional fracture, subluxation or bone destruction. Soft tissues and spinal canal: Prevertebral soft tissues normal thickness. Atherosclerotic calcifications of the carotid systems bilaterally, with the carotid arteries extending retropharyngeal bilaterally. Additional atherosclerotic calcifications at the proximal great vessels. Disc levels:  No additional abnormalities Upper chest: Lung apices clear Other: N/A IMPRESSION: Atrophy with small  vessel chronic ischemic changes of deep cerebral white matter. No acute intracranial abnormalities. Mild superior endplate compression fracture of T2 vertebral body with 10-20% anterior height loss, appears acute and is new since 02/06/2017. Mild superior endplate concavity and anterior height loss of T3 vertebral body, age indeterminate. Multilevel degenerative disc and facet disease changes cervical spine. Osseous demineralization. Findings called to Suella Broad PA on 04/28/2019 at 1235 hours. Electronically Signed   By: Lavonia Dana M.D.   On: 04/28/2019 12:36      Assessment/Plan Fall down steps Left posterior 8-11 rib fxs - pain control, pulm toilet, IS, PT/OT T2 endplate compression fracture - will ask NSGY to weigh in on this.  Already in TLSO that was ordered by EDP Multiple worsening and chronic compression fractures - pain control Dementia - cont home meds H/O CVA - on plavix at home with left-sided hemiparesis (not complete, but wears brace for significant supination of left ankle) Chronic A fib - continue coumadin, daily PT/INR CAD - continue home meds HTN - resume home meds COPD - resume home meds FEN - regular diet/MIVFs VTE - coumadin/plavix ID - none currently Admit - inpatient  Henreitta Cea, [facility] 04/28/2019, 4:24 PM Please see Amion for pager number during day hours 7:00am-4:30pm or 7:00am -11:30am on weekends

## 2019-04-28 NOTE — Progress Notes (Signed)
Orthopedic Tech Progress Note Patient Details:  Amber [name] July 18, 1934 [id number] Level 2 Trauma  Patient ID: Amber [name], female   DOB: [birth year], 85 y.o.   MRN: [id number]   [name] 04/28/2019, 12:36 PM

## 2019-04-28 NOTE — ED Provider Notes (Signed)
[facility] EMERGENCY DEPARTMENT Provider Note   CSN: [id number] Arrival date & time: 04/28/19  1134     History Chief Complaint  Patient presents with  . Fall    Amber [name] is a 85 y.o. female.  85 year old female presents from home with injuries from a fall.  Patient is on Coumadin and Plavix, was at home with her daughter when the daughter heard her fall down the stairs, patient reports having gone down about half of a 15 step flight of stairs before falling and landing on the cement landing below.  Patient denies loss of consciousness, did hit the front of her head as well as complains of pain in her neck and left ribs and right ankle.  Daughter reports INR of 2.2 last week.  Patient was alert when daughter found her after the fall.  No bleeding or open wounds.  No other complaints or concerns.        Past Medical History:  Diagnosis Date  . Abnormality of gait 08/02/2014  . Cerebrovascular disease 05/21/2016  . Cervical spinal stenosis   . Chronic atrial fibrillation (HCC)    a. chronic/rate controlled;  b. chronic coumadin.  . Chronic diastolic CHF (congestive heart failure) ([place])    a. 02/2013 Echo: EF 55-60%, mild AI, mod dil LA.  . CKD (chronic kidney disease), stage III   . COPD with emphysema ([place])    PFT 05/02/10>>FEV1 1.35(62%), FEV1% 66, DLCO 75%  . Coronary artery disease    a. s/p CABG;  b. Abnormal nuc 2015 - managed medically.  . CVA (cerebral vascular accident) ([place])    left sided weakness  . Depression   . Fibromyalgia   . GERD (gastroesophageal reflux disease)    pepcid 2-3 times per week  . Gout   . Hemiparesis and alteration of sensations as late effects of stroke ([place]) 01/17/2015  . History of melanoma    squamous cell, melanoma  . Hyperlipidemia    a. statin intolerant, not felt to be candidate for PCSK9 due to chronically elevated CK levels.  . Hypertension   . Hypertensive heart disease   . Hypothyroidism   . Insomnia    . Memory change 01/23/2014  . Nasal polyposis   . Osteoarthritis   . Osteoporosis   . Pneumonia    1990  . Tobacco abuse     Patient Active Problem List   Diagnosis Date Noted  . Acute pulmonary edema (HCC)   . Palliative care encounter   . Acute on chronic diastolic (congestive) heart failure ([place]) 03/04/2017  . COPD with acute exacerbation ([place]) 02/06/2017  . Acute on chronic respiratory failure with hypoxia ([place]) 02/06/2017  . Dementia ([place]) 02/06/2017  . Encounter for therapeutic drug monitoring 10/20/2016  . Cerebrovascular disease 05/21/2016  . Hemiparesis and alteration of sensations as late effects of stroke ([place]) 01/17/2015  . Memory change 01/23/2014  . 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 Long term current use of anticoagulant therapy   . Streptococcal pneumonia ([place]) 02/12/2013  . Vertebral artery stenosis 12/18/2011  . Fibromyalgia 05/10/2010  . Depression 05/10/2010  . Hypothyroidism 05/10/2010  . Osteoporosis 05/10/2010  . Coronary artery disease involving coronary bypass graft of native heart with angina pectoris ([place]) 05/10/2010  . Chronic diastolic heart failure ([place])   . Dyslipidemia   . Atrial fibrillation ([place]) 05/06/2010    Past Surgical History:  Procedure Laterality Date  . ABDOMINAL HYSTERECTOMY    . BREAST LUMPECTOMY  1980s  Benign lesion - right  . carpel tunnel     right  . CORONARY ARTERY BYPASS GRAFT  2000  . EYE SURGERY     bilateral cataracts  . IR GENERIC HISTORICAL  03/31/2016   IR RADIOLOGIST EVAL & MGMT 03/31/2016 [facility]INTERV RAD  . RADIOLOGY WITH ANESTHESIA  12/24/2011   Procedure: RADIOLOGY WITH ANESTHESIA;  Surgeon: Medication Radiologist, MD;  Location: [facility];  Service: Radiology;  Laterality: N/A;  Extra Cranial Vascular Stent  . RADIOLOGY WITH ANESTHESIA N/A 07/19/2014   Procedure: ANGIOPLASTY;  Surgeon: Luanne Bras, MD;  Location: [facility];  Service: Radiology;  Laterality: N/A;  . RADIOLOGY WITH ANESTHESIA N/A 07/27/2014    Procedure: ANGIOPLASTY;  Surgeon: Luanne Bras, MD;  Location: [facility];  Service: Radiology;  Laterality: N/A;  . TONSILLECTOMY       OB History   No obstetric history on file.     Family History  Problem Relation Age of Onset  . Stroke Mother   . CAD Father   . Diabetes type II Brother   . CAD Brother     Social History   Tobacco Use  . Smoking status: Former Smoker    Packs/day: 0.40    Years: 25.00    Pack years: 10.00    Types: Cigarettes    Quit date: 02/10/1977    Years since quitting: 42.2  . Smokeless tobacco: Never Used  Substance Use Topics  . Alcohol use: No  . Drug use: No    Home Medications Prior to Admission medications   Medication Sig Start Date End Date Taking? Authorizing Provider  allopurinol (ZYLOPRIM) 300 MG tablet Take 300 mg by mouth at bedtime.    Yes [provider]  amLODipine (NORVASC) 5 MG tablet TAKE 1 TABLET BY MOUTH EVERYDAY AT BEDTIME Patient taking differently: Take 5 mg by mouth at bedtime.  10/08/18  Yes Belva Crome, MD  celecoxib (CELEBREX) 200 MG capsule Take 200 mg by mouth every morning.    Yes [provider]  citalopram (CELEXA) 20 MG tablet TAKE 1 TABLET BY MOUTH TWICE A DAY Patient taking differently: Take 20 mg by mouth 2 (two) times daily.  11/09/14  Yes Darlin Coco, MD  clopidogrel (PLAVIX) 75 MG tablet TAKE 1 TABLET (75 MG TOTAL) BY MOUTH DAILY. 12/29/18  Yes Belva Crome, MD  COUMADIN 5 MG tablet TAKE 1 TO 1 &1/2 TABLETS DAILY AS DIRECTED Patient taking differently: Take 5 mg by mouth at bedtime.  05/28/18  Yes Belva Crome, MD  diphenoxylate-atropine (LOMOTIL) 2.5-0.025 MG tablet Take 1 tablet by mouth daily as needed for diarrhea or loose stools.   Yes [provider]  donepezil (ARICEPT) 5 MG tablet Take 1 tablet (5 mg total) by mouth daily. 04/19/19  Yes Suzzanne Cloud, NP  ergocalciferol (VITAMIN D2) 50000 UNITS capsule Take 50,000 Units by mouth 2 (two) times a week. Tuesday  and Friday   Yes [provider]  furosemide (LASIX) 80 MG tablet Take 80 mg by mouth daily.    Yes [provider]  levothyroxine (SYNTHROID, LEVOTHROID) 25 MCG tablet Take 25 mcg by mouth every morning.    Yes [provider]  losartan (COZAAR) 25 MG tablet TAKE 1 TABLET BY MOUTH EVERY DAY Patient taking differently: Take 25 mg by mouth daily.  10/04/18  Yes Burtis Junes, NP  memantine (NAMENDA) 10 MG tablet Take 1 tablet (10 mg total) by mouth 2 (two) times daily. 04/19/19  Yes Olegario Messier,  Charlynne Cousins, NP  metoprolol succinate (TOPROL-XL) 25 MG 24 hr tablet TAKE 1 TABLET BY MOUTH EVERY DAY Patient taking differently: Take 25 mg by mouth every morning.  10/11/18  Yes Belva Crome, MD  nitroGLYCERIN (NITROSTAT) 0.4 MG SL tablet Place 1 tablet (0.4 mg total) under the tongue every 5 (five) minutes as needed for chest pain. 09/13/12  Yes Darlin Coco, MD  potassium chloride SA (KLOR-CON M20) 20 MEQ tablet TAKE 1 TABLET EVERY OTHER DAY, ALTERNATING WITH 2 TABLETS EVERY OTHER DAY Patient taking differently: Take 20 mEq by mouth every morning.  12/31/18  Yes Belva Crome, MD  PROAIR HFA 108 (90 BASE) MCG/ACT inhaler INHALE 2 PUFFS INTO LUNGS EVERY 6 HOURS AS NEEDED FOR WHEEZING Patient taking differently: Inhale 2 puffs into the lungs every 6 (six) hours as needed for wheezing or shortness of breath.  12/22/13  Yes Darlin Coco, MD  traMADol (ULTRAM) 50 MG tablet Take 50 mg by mouth every 6 (six) hours as needed for pain. 04/02/19  Yes [provider]  ZINC-VITAMIN C PO Take 1 tablet by mouth every evening.   Yes [provider]  Respiratory Therapy Supplies (FLUTTER) DEVI Use as directed. 03/19/17   Parrett, Fonnie Mu, NP    Allergies    Ezetimibe, Welchol [colesevelam hcl], Adhesive [tape], Ceclor [cefaclor], Elastic bandages & [zinc], Latex, Lipitor [atorvastatin calcium], Mevacor [lovastatin], Pravachol, and Vasotec  Review of Systems   Review of  Systems  Unable to perform ROS: Dementia  Constitutional: Negative for fever.  Respiratory: Negative for shortness of breath.   Cardiovascular: Negative for chest pain.  Gastrointestinal: Negative for nausea and vomiting.  Musculoskeletal: Positive for arthralgias and neck pain. Negative for back pain.  Skin: Negative for rash and wound.  Neurological: Negative for headaches.  Hematological: Bruises/bleeds easily.    Physical Exam Updated Vital Signs BP (!) 156/88   Pulse (!) 128   Temp 98.4 F (36.9 C) (Oral)   Resp 16   Ht 5\' 5"  (1.651 m)   Wt 67.6 kg   SpO2 94% Comment: room air  BMI 24.79 kg/m   Physical Exam Vitals and nursing note reviewed.  Constitutional:      General: She is not in acute distress.    Appearance: She is well-developed. She is not diaphoretic.  HENT:     Head: Normocephalic.   Cardiovascular:     Rate and Rhythm: Normal rate and regular rhythm.     Pulses: Normal pulses.     Heart sounds: Normal heart sounds.  Pulmonary:     Effort: Pulmonary effort is normal.     Breath sounds: Normal breath sounds.  Chest:     Chest wall: Tenderness present.    Abdominal:     General: Abdomen is flat.     Tenderness: There is no abdominal tenderness.  Musculoskeletal:        General: Tenderness present. No swelling or deformity.     Cervical back: Neck supple. No tenderness.     Right lower leg: No edema.     Left lower leg: No edema.     Right ankle: No swelling, deformity or ecchymosis. Tenderness present over the lateral malleolus and medial malleolus.  Skin:    General: Skin is warm and dry.     Findings: No erythema or rash.  Neurological:     Mental Status: She is alert and oriented to person, place, and time.  Psychiatric:        Behavior:  Behavior normal.     ED Results / Procedures / Treatments   Labs (all labs ordered are listed, but only abnormal results are displayed) Labs Reviewed  BASIC METABOLIC PANEL - Abnormal; Notable for  the following components:      Result Value   Glucose, Bld 138 (*)    GFR calc non Af Amer 56 (*)    All other components within normal limits  CBC WITH DIFFERENTIAL/PLATELET - Abnormal; Notable for the following components:   WBC 12.2 (*)    HCT 47.7 (*)    Neutro Abs 9.8 (*)    All other components within normal limits  PROTIME-INR - Abnormal; Notable for the following components:   Prothrombin Time 23.2 (*)    INR 2.1 (*)    All other components within normal limits  CBG MONITORING, ED - Abnormal; Notable for the following components:   Glucose-Capillary 122 (*)    All other components within normal limits    EKG EKG Interpretation  Date/Time:  Thursday April 28 2019 11:45:10 EDT Ventricular Rate:  92 PR Interval:    QRS Duration: 86 QT Interval:  329 QTC Calculation: 407 R Axis:   56 Text Interpretation: Atrial fibrillation Nonspecific repol abnormality, inferior leads No acute changes No significant change since last tracing Confirmed by Varney Biles [phone]) on 04/28/2019 12:45:20 PM   Radiology DG Ribs Unilateral W/Chest Left  Result Date: 04/28/2019 CLINICAL DATA:  Pain following fall EXAM: LEFT RIBS AND CHEST - 3+ VIEW COMPARISON:  Chest radiograph March 19, 2017 FINDINGS: Frontal chest as well as oblique and cone-down rib images obtained. There is scarring in the lateral left base with questionable small chronic pleural effusion. There is slight atelectasis in the left mid lung. Lungs elsewhere clear. Heart is upper normal in size with pulmonary vascularity normal. Patient is status post internal mammary bypass grafting. There is aortic atherosclerosis. There are fractures of the anterior left fifth, sixth, seventh, eighth, ninth, and tenth ribs. No evident pneumothorax. IMPRESSION: Multiple mildly displaced left rib fractures. Chronic blunting of the left costophrenic angle with questionable small pleural effusion. No pneumothorax. Lungs are otherwise clear except for  mild left midlung atelectasis. Stable cardiac silhouette with postoperative changes. Aortic Atherosclerosis (ICD10-I70.0). Electronically Signed   By: Lowella Grip III M.D.   On: 04/28/2019 12:47   DG Thoracic Spine 2 View  Result Date: 04/28/2019 CLINICAL DATA:  Generalized back pain, fall EXAM: THORACIC SPINE 2 VIEWS COMPARISON:  03/19/2017, 07/31/2016, 10/29/2012 FINDINGS: Moderate to severe compression deformities of the T12 and L1 vertebral bodies, unchanged from 2014. Moderate compression deformity of T7, unchanged from 2018. Mild superior endplate compression deformity of T9 appears new from prior 03/19/2017. No static listhesis. Median sternotomy changes. Calcific aortic knob. Visualized lung fields clear. IMPRESSION: 1. Mild superior endplate compression deformity of T9 appears new from prior 03/19/2017. Correlate with point tenderness at this level. 2. Moderate to severe compression deformities of T12 and L1 vertebral bodies, unchanged from 2014. 3. Moderate compression deformity of T7, unchanged from 2018. Electronically Signed   By: Davina Poke D.O.   On: 04/28/2019 13:36   DG Ankle Complete Right  Result Date: 04/28/2019 CLINICAL DATA:  Pain following fall EXAM: RIGHT ANKLE - COMPLETE 3+ VIEW COMPARISON:  None. FINDINGS: Frontal, oblique, and lateral views were obtained. There is no fracture or appreciable joint effusion. There is moderate generalized joint space narrowing. No erosion. Ankle mortise appears intact. There is a posterior calcaneal spur. There is calcification in the  soft tissues posterior to the ankle mortise. IMPRESSION: Generalized joint space narrowing. No acute fracture. Ankle mortise appears intact. Small posterior calcaneal spur. Probable calcified hematoma in the posterior soft tissues at the ankle mortise level. Electronically Signed   By: Lowella Grip III M.D.   On: 04/28/2019 12:53   CT Head Wo Contrast  Result Date: 04/28/2019 CLINICAL DATA:  [place]  down the basement stairs down approximately 15 carpeted steps landing on cement floor, on blood thinners, struck head, no loss of consciousness; history chronic atrial fibrillation, CHF, COPD, coronary artery disease, stroke, stage III chronic kidney disease, hypertension, dementia, former smoker EXAM: CT HEAD WITHOUT CONTRAST CT CERVICAL SPINE WITHOUT CONTRAST TECHNIQUE: Multidetector CT imaging of the head and cervical spine was performed following the standard protocol without intravenous contrast. Multiplanar CT image reconstructions of the cervical spine were also generated. COMPARISON:  CT cervical spine 02/06/2017, CT head 03/05/2017 FINDINGS: CT HEAD FINDINGS Brain: Generalized atrophy with ex vacuo dilatation of the ventricles. No midline shift or mass effect. Small vessel chronic ischemic changes of deep cerebral white matter. No intracranial hemorrhage, mass lesion or evidence of acute infarction. No extra-axial fluid collections. Vascular: No hyperdense vessels. Atherosclerotic calcification of internal carotid arteries at skull base Skull: Demineralized but intact. Small high RIGHT frontal scalp hematoma. Sinuses/Orbits: Clear. Material within the external auditory canals bilaterally question cerumen. Other: N/A CT CERVICAL SPINE FINDINGS Alignment: Normal Skull base and vertebrae: Osseous demineralization. Mild superior endplate compression fracture of T2 vertebral body with 10-20% anterior height loss new since 02/06/2017. Multilevel disc space narrowing and minor endplate spur formation. Interval mild superior endplate concavity of T3 vertebral body and minimal anterior height loss though this is age-indeterminate. Multilevel facet degenerative changes. No additional fracture, subluxation or bone destruction. Soft tissues and spinal canal: Prevertebral soft tissues normal thickness. Atherosclerotic calcifications of the carotid systems bilaterally, with the carotid arteries extending retropharyngeal  bilaterally. Additional atherosclerotic calcifications at the proximal great vessels. Disc levels:  No additional abnormalities Upper chest: Lung apices clear Other: N/A IMPRESSION: Atrophy with small vessel chronic ischemic changes of deep cerebral white matter. No acute intracranial abnormalities. Mild superior endplate compression fracture of T2 vertebral body with 10-20% anterior height loss, appears acute and is new since 02/06/2017. Mild superior endplate concavity and anterior height loss of T3 vertebral body, age indeterminate. Multilevel degenerative disc and facet disease changes cervical spine. Osseous demineralization. Findings called to Suella Broad PA on 04/28/2019 at 1235 hours. Electronically Signed   By: Lavonia Dana M.D.   On: 04/28/2019 12:36   CT Chest Wo Contrast  Result Date: 04/28/2019 CLINICAL DATA:  Pain following fall EXAM: CT CHEST WITHOUT CONTRAST TECHNIQUE: Multidetector CT imaging of the chest was performed following the standard protocol without IV contrast. COMPARISON:  Chest and rib radiographs April 28, 2019; CT angiogram chest April 28, 2010. Chest radiograph March 19, 2017 FINDINGS: Cardiovascular: There is no thoracic aortic aneurysm. There is aortic atherosclerosis with calcification throughout the thoracic aorta. There is calcification in scattered areas in the visualized great vessels. Patient is status post coronary artery bypass grafting. There is no pericardial effusion or pericardial thickening. Mediastinum/Nodes: Thyroid appears unremarkable. There are scattered subcentimeter lymph nodes without thoracic adenopathy. There is a small hiatal hernia. Lungs/Pleura: There is a small pleural effusion. There is no evident pneumothorax. There is mild tree on bud type appearance in the posterior segment of the right upper lobe. No consolidation. There is slight bibasilar atelectasis. Upper Abdomen: There is upper abdominal  aortic atherosclerosis. Visualized upper abdominal  structures otherwise appear unremarkable. Musculoskeletal: There is evidence of a prior fracture of the posterior left sixth rib with evidence of healing. There is evidence of a prior fracture of the posterior left seventh rib with evidence of healing. There is a acute appearing displaced fracture of the posterolateral left eighth rib. There is a fracture, mildly displaced, which appears acute involving the posterolateral left ninth rib. There is a fracture involving the lateral left tenth rib. There is an acute fracture involving the lateral left eleventh rib. There is marked wedging of the T7, T12, and L1 vertebral bodies. There is milder wedging along the superior aspect of T9. There is also slight anterior wedging at the superior endplate of T6. Patient is status post median sternotomy. There has been progression of compression at L1, T9, and T6 compared to the previous study. Other fractures were present on prior chest radiograph from 2019. There are no blastic or lytic bone lesions. No chest wall lesions are evident. IMPRESSION: 1. Several left-sided acute rib fractures. There are also older healed rib fractures on the left. Several vertebral body fractures are noted in the thoracic and upper lumbar region with overall progression of wedging at L1 and to a lesser extent at T9 and T6. Wedge fractures at T7 and T12 were present on previous chest radiograph. 2.  Fairly small left pleural effusion.  No pneumothorax. 3. There is mild tree on bud type appearance in the posterior segment right upper lobe, felt to be indicative of a focal area of pneumonia. No similar foci of pneumonia elsewhere. There is mild bibasilar atelectasis. 4. There is aortic atherosclerosis as well as evidence of prior coronary artery bypass grafting. 5.  Small hiatal hernia. 6.  No evident adenopathy. Aortic Atherosclerosis (ICD10-I70.0). Electronically Signed   By: Lowella Grip III M.D.   On: 04/28/2019 14:53   CT Cervical Spine Wo  Contrast  Result Date: 04/28/2019 CLINICAL DATA:  [place] down the basement stairs down approximately 15 carpeted steps landing on cement floor, on blood thinners, struck head, no loss of consciousness; history chronic atrial fibrillation, CHF, COPD, coronary artery disease, stroke, stage III chronic kidney disease, hypertension, dementia, former smoker EXAM: CT HEAD WITHOUT CONTRAST CT CERVICAL SPINE WITHOUT CONTRAST TECHNIQUE: Multidetector CT imaging of the head and cervical spine was performed following the standard protocol without intravenous contrast. Multiplanar CT image reconstructions of the cervical spine were also generated. COMPARISON:  CT cervical spine 02/06/2017, CT head 03/05/2017 FINDINGS: CT HEAD FINDINGS Brain: Generalized atrophy with ex vacuo dilatation of the ventricles. No midline shift or mass effect. Small vessel chronic ischemic changes of deep cerebral white matter. No intracranial hemorrhage, mass lesion or evidence of acute infarction. No extra-axial fluid collections. Vascular: No hyperdense vessels. Atherosclerotic calcification of internal carotid arteries at skull base Skull: Demineralized but intact. Small high RIGHT frontal scalp hematoma. Sinuses/Orbits: Clear. Material within the external auditory canals bilaterally question cerumen. Other: N/A CT CERVICAL SPINE FINDINGS Alignment: Normal Skull base and vertebrae: Osseous demineralization. Mild superior endplate compression fracture of T2 vertebral body with 10-20% anterior height loss new since 02/06/2017. Multilevel disc space narrowing and minor endplate spur formation. Interval mild superior endplate concavity of T3 vertebral body and minimal anterior height loss though this is age-indeterminate. Multilevel facet degenerative changes. No additional fracture, subluxation or bone destruction. Soft tissues and spinal canal: Prevertebral soft tissues normal thickness. Atherosclerotic calcifications of the carotid systems  bilaterally, with the carotid arteries extending retropharyngeal bilaterally.  Additional atherosclerotic calcifications at the proximal great vessels. Disc levels:  No additional abnormalities Upper chest: Lung apices clear Other: N/A IMPRESSION: Atrophy with small vessel chronic ischemic changes of deep cerebral white matter. No acute intracranial abnormalities. Mild superior endplate compression fracture of T2 vertebral body with 10-20% anterior height loss, appears acute and is new since 02/06/2017. Mild superior endplate concavity and anterior height loss of T3 vertebral body, age indeterminate. Multilevel degenerative disc and facet disease changes cervical spine. Osseous demineralization. Findings called to Suella Broad PA on 04/28/2019 at 1235 hours. Electronically Signed   By: Lavonia Dana M.D.   On: 04/28/2019 12:36    Procedures .Critical Care Performed by: Tacy Learn, PA-C Authorized by: Tacy Learn, PA-C   Critical care provider statement:    Critical care time (minutes):  45   Critical care was time spent personally by me on the following activities:  Discussions with consultants, evaluation of patient's response to treatment, examination of patient, ordering and performing treatments and interventions, ordering and review of laboratory studies, ordering and review of radiographic studies, pulse oximetry, re-evaluation of patient's condition, obtaining history from patient or surrogate and review of old charts   (including critical care time)  Medications Ordered in ED Medications - No data to display  ED Course  I have reviewed the triage vital signs and the nursing notes.  Pertinent labs & imaging results that were available during my care of the patient were reviewed by me and considered in my medical decision making (see chart for details).  Clinical Course as of Apr 28 1603  Thu Apr 28, 7126  [id number] 85 year old female presents from home after fall down approximately 7  carpeted steps landing on a cement landing, no loss of consciousness, daughter was present immediately following the fall.  Patient reports pain in her left ribs, right ankle as well hematoma to her forehead. Level 2 trauma was called due to fall with head injury on Coumadin. CT head without traumatic injury, CT C-spine with concern for compression fracture upper t-spine. CT chest shows 4 acute left side rib fractures with acute on chronic compression fractures. Right ankle x-ray unremarkable- suspect sprain. Patient placed in TLSO brace.  Results discussed with patient and daughter at bedside, will admit. Case discussed with Dr. Grandville Silos with trauma service who will admit.   [LM]  1605 Review of labs, CBC with mild leukocytosis of 12.2, BMP without significant findings.  INR of 2.1, therapeutic.   [LM]    Clinical Course User Index [LM] Roque Lias   MDM Rules/Calculators/A&P                       Final Clinical Impression(s) / ED Diagnoses Final diagnoses:  Fall, initial encounter  Closed fracture of multiple ribs of left side, initial encounter  Contusion of scalp, initial encounter  Compression fracture of body of thoracic vertebra ([place])  Sprain of right ankle, unspecified ligament, initial encounter  Trauma    Rx / DC Orders ED Discharge Orders    None       Tacy Learn, PA-C 04/28/19 [place], MD 05/02/19 1614

## 2019-04-28 NOTE — ED Notes (Signed)
Provider at bedside

## 2019-04-28 NOTE — ED Notes (Signed)
This RN was advised by the [facility] that they were planning on coordinating her to a different room possibly; informed that I would be contacted with further information

## 2019-04-28 NOTE — Progress Notes (Signed)
Orthopedic Tech Progress Note Patient Details:  Amber [name] 23-Jan-1935 [id number] Called in order to [facility] for a TLSO Patient ID: Amber [name], female   DOB: Jun 23, 1934, 85 y.o.   MRN: [id number]   [name] 04/28/2019, 3:01 PM

## 2019-04-28 NOTE — ED Triage Notes (Addendum)
Pt fell down the basement stairs- approx 15 stairs. The stairs are carpeted, the bottom landing is cement. Pt is on blood thinners. Pt hit her head. No LOC that pt's dtr could tell.

## 2019-04-29 ENCOUNTER — Inpatient Hospital Stay (HOSPITAL_COMMUNITY): Payer: Medicare PPO

## 2019-04-29 LAB — CBC
HCT: 38.6 % (ref 36.0–46.0)
Hemoglobin: 12.3 g/dL (ref 12.0–15.0)
MCH: 29.2 pg (ref 26.0–34.0)
MCHC: 31.9 g/dL (ref 30.0–36.0)
MCV: 91.7 fL (ref 80.0–100.0)
Platelets: 195 10*3/uL (ref 150–400)
RBC: 4.21 MIL/uL (ref 3.87–5.11)
RDW: 15.2 % (ref 11.5–15.5)
WBC: 8.2 10*3/uL (ref 4.0–10.5)
nRBC: 0 % (ref 0.0–0.2)

## 2019-04-29 LAB — BASIC METABOLIC PANEL
Anion gap: 13 (ref 5–15)
BUN: 6 mg/dL — ABNORMAL LOW (ref 8–23)
CO2: 26 mmol/L (ref 22–32)
Calcium: 8.8 mg/dL — ABNORMAL LOW (ref 8.9–10.3)
Chloride: 103 mmol/L (ref 98–111)
Creatinine, Ser: 0.75 mg/dL (ref 0.44–1.00)
GFR calc Af Amer: >60 mL/min (ref >60)
GFR calc non Af Amer: >60 mL/min (ref >60)
Glucose, Bld: 126 mg/dL — ABNORMAL HIGH (ref 70–99)
Potassium: 3 mmol/L — ABNORMAL LOW (ref 3.5–5.1)
Sodium: 142 mmol/L (ref 135–145)

## 2019-04-29 LAB — SARS CORONAVIRUS 2 (TAT 6-24 HRS): SARS Coronavirus 2: NEGATIVE

## 2019-04-29 MED ORDER — POTASSIUM CHLORIDE CRYS ER 20 MEQ PO TBCR
40.0000 meq | EXTENDED_RELEASE_TABLET | Freq: Once | ORAL | Status: AC
  Administered 2019-04-29: 40 meq via ORAL
  Filled 2019-04-29: qty 2

## 2019-04-29 NOTE — Progress Notes (Signed)
Orthopedic Tech Progress Note Patient Details:  Amber [name] 07-01-1934 [id number]  Ortho Devices Type of Ortho Device: Ankle Air splint Ortho Device/Splint Location: right Ortho Device/Splint Interventions: Application   Post Interventions Patient Tolerated: Well Instructions Provided: Care of device   [place] Pink 04/29/2019, 11:39 AM

## 2019-04-29 NOTE — Care Management Obs Status (Signed)
[place] NOTIFICATION   Patient Details  Name: Amber [name] MRN: [id number] Date of Birth: 1934/10/21   Medicare Observation Status Notification Given:       Ella Bodo, RN 04/29/2019, 4:11 PM

## 2019-04-29 NOTE — Progress Notes (Signed)
Occupational Therapy Evaluation  Pt admitted with the below listed diagnosis and demonstrates the below listed deficits.   She requires min A - max A for ADLs and max A +2 to move sit to stand, and min A (+2 for safety) for functional ambulation.   She lives with her spouse with daughters assisting.  Daughters plan to provide 24 hour assist at discharge, recommend HHOT and 24 hour assist.      04/29/19 1316  OT Visit Information  Last OT Received On 04/29/19  Assistance Needed +2  PT/OT/SLP Co-Evaluation/Treatment Yes  Reason for Co-Treatment Complexity of the patient's impairments (multi-system involvement);Necessary to address cognition/behavior during functional activity;For patient/therapist safety;To address functional/ADL transfers  OT goals addressed during session Strengthening/ROM;ADL's and self-care  History of Present Illness 85 yo admitted after fall down stairs with Left rib fx T2,6,9 compression fx of acute on chronic insufficiency, L1 fx. PMhx: CVA with left weakness, dementia, COPD, CAD, CHF, AFib, T7 &12 chronic fx  Precautions  Precautions Fall;Back  Required Braces or Orthoses Spinal Brace;Other Brace (AFO LLE, air splint right ankle)  Spinal Brace TLSO;Applied in sitting position  Other Brace double upright AFO Lt, air cast Rt   Home Living  Family/patient expects to be discharged to: Private residence  Living Arrangements Spouse/significant other;Children  Available Help at Discharge Family;Available 24 hours/day;Available PRN/intermittently  Type of [place] One level  Bathroom Shower/Tub Walk-in shower  Bathroom Toilet Handicapped height  Home Equipment Walker - 4 wheels;BSC;Tub bench  Additional Comments daughter's work, but spouse is home with pt.  Daughter's provide night time assist and PRN support during the day   Prior Function  Level of Independence Needs assistance  Gait / Transfers Assistance Needed rollator  for gait  ADL's / [place] pt has supervision to get into/out of shower but bathes and dresses with supervision. Family does the homemaking. Daughters spend the night  Communication  Communication No difficulties  Pain Assessment  Pain Assessment Faces  Faces Pain Scale 2  Pain Location Rt ankle   Pain Descriptors / Indicators Grimacing  Pain Intervention(s) Monitored during session  Cognition  Arousal/Alertness Awake/alert  Behavior During Therapy WFL for tasks assessed/performed  Overall Cognitive Status History of cognitive impairments - at baseline  General Comments Pt follows commands consistently, and is very appropriate with conversation   Upper Extremity Assessment  Upper Extremity Assessment Generalized weakness;RUE deficits/detail;LUE deficits/detail  RUE Deficits / Details arthritic deformity resulting in limited ROM of digits   RUE Coordination decreased fine motor  LUE Deficits / Details Pt with h/o stroke with residual weakness. arthritic deformity resulting in limited ROM of digits.  Pt with significant bruising Lt palm and edema noted ulnar aspect of dorsum of hand   LUE Coordination decreased fine motor  Lower Extremity Assessment  Lower Extremity Assessment Defer to PT evaluation  Cervical / Trunk Assessment  Cervical / Trunk Assessment Kyphotic  ADL  Overall ADL's  Needs assistance/impaired  Eating/Feeding Set up;Sitting  Grooming Wash/dry hands;Wash/dry face;Oral care;Minimal assistance;Standing  Grooming Details (indicate cue type and reason) Requires assist to prevent bending forward, and  min guard for balance while brushing teeth   Upper Body Bathing Minimal assistance;Sitting  Lower Body Bathing Sit to/from stand;Moderate assistance  Upper Body Dressing  Moderate assistance;Sitting  Lower Body Dressing Maximal assistance;Sit to/from stand  Lower Body Dressing Details (indicate cue type and reason) Pt able to perform figure 4 with effort,  but unable  to don Lt sock or AFO   Toilet Transfer Moderate assistance;+2 for safety/equipment;Ambulation;Comfort height toilet;BSC;Grab bars;RW  Toileting- Clothing Manipulation and Hygiene Maximal assistance;Sit to/from stand  Functional mobility during ADLs Moderate assistance;+2 for safety/equipment;Rolling walker  Vision- History  Patient Visual Report No change from baseline  Bed Mobility  General bed mobility comments Pt sitting on EOB upon OT arrival   Transfers  Overall transfer level Needs assistance  Equipment used Rolling walker (2 wheeled)  Transfers Sit to/from [employer] Transfers  Sit to Stand Max assist;+2 physical assistance;From elevated surface  Stand pivot transfers Min assist;+2 safety/equipment  General transfer comment max +2 assist for rise from bed  with RW present, multimodal cues  Balance  Overall balance assessment Needs assistance;History of Falls  Sitting-balance support Feet supported  Sitting balance-Leahy Scale Fair  Sitting balance - Comments tendency for posterior lean with posterior pelvic rotation, minguard for EOB  Standing balance-Leahy Scale Poor  Standing balance comment bil UE assist and no UE assist with increased trunk flexion and lean into sink without UE support. guarding and cues for balance due to trunk flexion  General Comments  General comments (skin integrity, edema, etc.) daughter present.  Discussed needs for home.  She reports they likely would prefer pt discharge home, but needs to speak with her sister to ensure they can provide necessary level of assist as pt's spouse is unable to do so.   OT - End of Session  Equipment Utilized During Treatment Gait belt;Rolling walker;Back brace  Activity Tolerance Patient tolerated treatment well  Patient left in chair;with call bell/phone within reach;with chair alarm set;with family/visitor present  Nurse Communication Mobility status;Precautions  OT Assessment  OT  Recommendation/Assessment Patient needs continued OT Services  OT Visit Diagnosis Unsteadiness on feet (R26.81);Cognitive communication deficit (R41.841)  OT Problem List Decreased strength;Decreased activity tolerance;Impaired balance (sitting and/or standing);Decreased coordination;Decreased cognition;Decreased safety awareness;Decreased knowledge of use of DME or AE;Decreased knowledge of precautions;Impaired UE functional use;Pain  Barriers to Discharge Decreased caregiver support  Barriers to Discharge Comments daughters unsure if they can provide necessary level of care   OT Plan  OT Frequency (ACUTE ONLY) Min 2X/week  OT Treatment/Interventions (ACUTE ONLY) Self-care/ADL training;DME and/or AE instruction;Therapeutic activities;Cognitive remediation/compensation;Patient/family education;Balance training  AM-PAC OT "6 Clicks" Daily Activity Outcome Measure (Version 2)  Help from another person eating meals? 3  Help from another person taking care of personal grooming? 3  Help from another person toileting, which includes using toliet, bedpan, or urinal? 2  Help from another person bathing (including washing, rinsing, drying)? 2  Help from another person to put on and taking off regular upper body clothing? 2  Help from another person to put on and taking off regular lower body clothing? 2  6 Click Score 14  OT Recommendation  Follow Up Recommendations SNF;Supervision/Assistance - 24 hour (if famiy able to provide necessary level of assist, then [facility])  OT Equipment 3 in 1 bedside commode  Individuals Consulted  Consulted and Agree with Results and Recommendations Patient;Family member/caregiver  Family Member Consulted daughter   Acute Rehab OT Goals  Patient Stated Goal to go home soon   OT Goal Formulation With patient  Time For Goal Achievement 05/13/19  Potential to Achieve Goals Good  OT Time Calculation  OT Start Time (ACUTE ONLY) 1231  OT Stop Time (ACUTE ONLY) 1302  OT Time  Calculation (min) 31 min  OT General Charges  $OT Visit 1 Visit  OT Evaluation  $OT Eval Moderate Complexity  1 Mod  Written Expression  Dominant Hand Right  Nilsa Nutting., OTR/L Acute Rehabilitation Services Pager [phone] Office [phone]

## 2019-04-29 NOTE — Progress Notes (Signed)
PHARMACIST - PHYSICIAN COMMUNICATION DR:  Trauma MD CONCERNING: Pharmacy Care Issues Regarding Warfarin Labs  RECOMMENDATION (Action Taken): A daily protime for three days has been ordered to meet the [facility] National Patient safety goal and comply with the current [place].   The Pharmacy will defer all warfarin dose order changes and follow up of lab results to the prescriber unless an additional order to initiate a "pharmacy Coumadin consult" is placed.  DESCRIPTION:  While hospitalized, to be in compliance with [facility] Patient Safety Goals, all patients on warfarin must have a baseline and/or current protime prior to the administration of warfarin. Pharmacy has received your order for warfarin without these required laboratory assessments.  Thank you for involving pharmacy in this patient's care.  Renold Genta, PharmD, BCPS Clinical Pharmacist Clinical phone for 04/29/2019 until 3p is [phone] 04/29/2019 10:18 AM  **Pharmacist phone directory can be found on [email or web address] listed under [place]**

## 2019-04-29 NOTE — Evaluation (Signed)
Physical Therapy Evaluation Patient Details Name: Amber [name] MRN: [id number] DOB: 11-Aug-1934 Today's Date: 04/29/2019   History of Present Illness  85 yo admitted after fall down stairs with Left rib fx T2,6,9 compression fx of acute on chronic insufficiency, L1 fx. PMhx: CVA with left weakness, dementia, COPD, CAD, CHF, AFib, T7 &12 chronic fx  Clinical Impression  Pt pleasant, reports no pain, daughter present throughout. Pt and daughter educated for back precautions, brace wear and functional mobility. Pt with assist for all mobility particularly from bed (she sleeps in recliner at home) and standing. Pt with decreased strength, function, mobility and awareness of precautions who will benefit from acute therapy to maximize mobility, safety and function to decrease burden of care.      Follow Up Recommendations Home health PT;Supervision/Assistance - 24 hour;SNF(HHPT if family able to provide sufficient assist)    Equipment Recommendations  None recommended by PT    Recommendations for Other Services       Precautions / Restrictions Precautions Precautions: Fall;Back Required Braces or Orthoses: Spinal Brace;Other Brace(AFO LLE, air splint right ankle) Spinal Brace: Thoracolumbosacral orthotic;Applied in sitting position      Mobility  Bed Mobility Overal bed mobility: Needs Assistance Bed Mobility: Rolling;Sidelying to Sit Rolling: Mod assist Sidelying to sit: Mod assist       General bed mobility comments: cues for sequence with assist to bend knee and roll to side then elevate trunk. Max multimodal cues. Pt sleeps in recliner at home  Transfers Overall transfer level: Needs assistance   Transfers: Sit to/from Stand Sit to Stand: Max assist;+2 physical assistance;From elevated surface         General transfer comment: max +2 assist for rise from bed  with RW present, multimodal cues  Ambulation/Gait Ambulation/Gait assistance: Min assist Gait Distance  (Feet): 20 Feet Assistive device: Rolling walker (2 wheeled) Gait Pattern/deviations: Step-to pattern;Decreased stride length;Trunk flexed;Shuffle   Gait velocity interpretation: <1.31 ft/sec, indicative of household ambulator General Gait Details: cues for posture, direction and safety  Stairs            Wheelchair Mobility    Modified Rankin (Stroke Patients Only)       Balance Overall balance assessment: Needs assistance;History of Falls   Sitting balance-Leahy Scale: Fair Sitting balance - Comments: tendency for posterior lean with posterior pelvic rotation, minguard for EOB     Standing balance-Leahy Scale: Poor Standing balance comment: bil UE assist and no UE assist with increased trunk flexion and lean into sink without UE support. guarding and cues for balance due to trunk flexion                             Pertinent Vitals/Pain Pain Assessment: No/denies pain    Home Living Family/patient expects to be discharged to:: Private residence Living Arrangements: Spouse/significant other;Children Available Help at Discharge: Family;Available 24 hours/day Type of Home: House Home Access: Ramped entrance     Home Layout: One level Home Equipment: Walker - 4 wheels;Bedside commode;Tub bench      Prior Function Level of Independence: Needs assistance   Gait / Transfers Assistance Needed: rollator for gait  ADL's / Homemaking Assistance Needed: pt has supervision to get into/out of shower but bathes and dresses with supervision. Family does the homemaking. Daughters spend the night        Hand Dominance        Extremity/Trunk Assessment   Upper Extremity Assessment Upper Extremity  Assessment: Defer to OT evaluation    Lower Extremity Assessment Lower Extremity Assessment: Generalized weakness;LLE deficits/detail;RLE deficits/detail RLE Deficits / Details: pt reporting sore ankle with initial stand LLE Deficits / Details: AFO due to  weakness and inversion    Cervical / Trunk Assessment Cervical / Trunk Assessment: Kyphotic  Communication   Communication: No difficulties  Cognition Arousal/Alertness: Awake/alert Behavior During Therapy: WFL for tasks assessed/performed Overall Cognitive Status: History of cognitive impairments - at baseline                                        General Comments      Exercises     Assessment/Plan    PT Assessment Patient needs continued PT services  PT Problem List Decreased strength;Decreased mobility;Decreased safety awareness;Decreased activity tolerance;Decreased balance;Decreased knowledge of use of DME;Decreased cognition       PT Treatment Interventions DME instruction;Therapeutic exercise;Gait training;Balance training;Functional mobility training;Cognitive remediation;Therapeutic activities;Patient/family education    PT Goals (Current goals can be found in the Care Plan section)  Acute Rehab PT Goals Patient Stated Goal: be able to know what to do to return home per daughter PT Goal Formulation: With family Time For Goal Achievement: 05/13/19 Potential to Achieve Goals: Fair    Frequency Min 4X/week   Barriers to discharge Decreased caregiver support Daughter unsure if she and sister can provide adequate 24hr assist for D/C home    Co-evaluation PT/OT/SLP Co-Evaluation/Treatment: Yes Reason for Co-Treatment: Complexity of the patient's impairments (multi-system involvement) PT goals addressed during session: Mobility/safety with mobility;Proper use of DME;Balance         AM-PAC PT "6 Clicks" Mobility  Outcome Measure Help needed turning from your back to your side while in a flat bed without using bedrails?: A Lot Help needed moving from lying on your back to sitting on the side of a flat bed without using bedrails?: A Lot Help needed moving to and from a bed to a chair (including a wheelchair)?: A Lot Help needed standing up from a  chair using your arms (e.g., wheelchair or bedside chair)?: Total Help needed to walk in hospital room?: A Little Help needed climbing 3-5 steps with a railing? : A Lot 6 Click Score: 12    End of Session Equipment Utilized During Treatment: Gait belt;Back brace Activity Tolerance: Patient tolerated treatment well Patient left: in chair;with call bell/phone within reach;with family/visitor present;with chair alarm set Nurse Communication: Mobility status;Precautions PT Visit Diagnosis: Other abnormalities of gait and mobility (R26.89);Difficulty in walking, not elsewhere classified (R26.2);History of falling (Z91.81);Unsteadiness on feet (R26.81)    Time: [id number] PT Time Calculation (min) (ACUTE ONLY): 35 min   Charges:   PT Evaluation $PT Eval Moderate Complexity: 1 Mod          Amber [name] P, PT Acute Rehabilitation Services Pager: [phone] Office: [phone]   Aubreyanna Dorrough B Dave Mannes 04/29/2019, 1:16 PM

## 2019-04-29 NOTE — Discharge Instructions (Signed)
Rib Fracture  A rib fracture is a break or crack in one of the bones of the ribs. The ribs are like a cage that goes around your upper chest. A broken or cracked rib is often painful, but most do not cause other problems. Most rib fractures usually heal on their own in 1-3 months. Follow these instructions at home: Managing pain, stiffness, and swelling  If directed, apply ice to the injured area. ? Put ice in a plastic bag. ? Place a towel between your skin and the bag. ? Leave the ice on for 20 minutes, 2-3 times a day.  Take over-the-counter and prescription medicines only as told by your doctor. Activity  Avoid activities that cause pain to the injured area. Protect your injured area.  Slowly increase activity as told by your doctor. General instructions  Do deep breathing as told by your doctor. You may be told to: ? Take deep breaths many times a day. ? Cough many times a day while hugging a pillow. ? Use a device (incentive spirometer) to do deep breathing many times a day.  Drink enough fluid to keep your pee (urine) clear or pale yellow.  Do not wear a rib belt or binder. These do not allow you to breathe deeply.  Keep all follow-up visits as told by your doctor. This is important. Contact a doctor if:  You have a fever. Get help right away if:  You have trouble breathing.  You are short of breath.  You cannot stop coughing.  You cough up thick or bloody spit (sputum).  You feel sick to your stomach (nauseous), throw up (vomit), or have belly (abdominal) pain.  Your pain gets worse and medicine does not help. Summary  A rib fracture is a break or crack in one of the bones of the ribs.  Apply ice to the injured area and take medicines for pain as told by your doctor.  Take deep breaths and cough many times a day. Hug a pillow every time you cough. This information is not intended to replace advice given to you by your health care provider. Make sure you  discuss any questions you have with your health care provider. Document Revised: 01/09/2017 Document Reviewed: 04/29/2016 Elsevier Patient Education  [place].   Spinal Compression Fracture  A spinal compression fracture is a collapse of the bones that form the spine (vertebrae). With this type of fracture, the vertebrae become pushed (compressed) into a wedge shape. Most compression fractures happen in the middle or lower part of the spine. What are the causes? This condition may be caused by:  Thinning and loss of density in the bones (osteoporosis). This is the most common cause.  A fall.  A car or motorcycle accident.  Cancer.  Trauma, such as a heavy, direct hit to the head or back. What increases the risk? You are more likely to develop this condition if:  You are 60 years or older.  You have osteoporosis.  You have certain types of cancer, including: ? Multiple myeloma. ? Lymphoma. ? Prostate cancer. ? Lung cancer. ? Breast cancer. What are the signs or symptoms? Symptoms of this condition include:  Severe pain.  Pain that gets worse over time.  Pain that is worse when you stand, walk, sit, or bend.  Sudden pain that is so bad that it is hard for you to move.  Bending or humping of the spine.  Gradual loss of height.  Numbness, tingling, or  weakness in the back and legs.  Trouble walking. Your symptoms will depend on the cause of the fracture and how quickly it develops. How is this diagnosed? This condition may be diagnosed based on symptoms, medical history, and a physical exam. During the physical exam, your health care provider may tap along the length of your spine to check for tenderness. Tests may be done to confirm the diagnosis. They may include:  A bone mineral density test to check for osteoporosis.  Imaging tests, such as a spine X-ray, CT scan, or MRI. How is this treated? Treatment for this condition depends on the cause and  severity of the condition. Some fractures may heal on their own with supportive care. Treatment may include:  Pain medicine.  Rest.  A back brace.  Physical therapy exercises.  Medicine to strengthen bone.  Calcium and vitamin D supplements. Fractures that cause the back to become misshapen, cause nerve pain or weakness, or do not respond to other treatment may be treated with surgery. This may include:  Vertebroplasty. Bone cement is injected into the collapsed vertebrae to stabilize them.  Balloon kyphoplasty. The collapsed vertebrae are expanded with a balloon and then bone cement is injected into them.  Spinal fusion. The collapsed vertebrae are connected (fused) to normal vertebrae. Follow these instructions at home: Medicines  Take over-the-counter and prescription medicines only as told by your health care provider.  Do not drive or operate heavy machinery while taking prescription pain medicine.  If you are taking prescription pain medicine, take actions to prevent or treat constipation. Your health care provider may recommend that you: ? Drink enough fluid to keep your urine pale yellow. ? Eat foods that are high in fiber, such as fresh fruits and vegetables, whole grains, and beans. ? Limit foods that are high in fat and processed sugars, such as fried or sweet foods. ? Take an over-the-counter or prescription medicine for constipation. If you have a brace:  Wear the brace as told by your health care provider. Remove it only as told by your health care provider.  Loosen the brace if your fingers or toes tingle, become numb, or turn cold and blue.  Keep the brace clean.  If the brace is not waterproof: ? Do not let it get wet. ? Cover it with a watertight covering when you take a bath or a shower. Managing pain, stiffness, and swelling   If directed, apply ice to the injured area: ? If you have a removable brace, remove it as told by your health care  provider. ? Put ice in a plastic bag. ? Place a towel between your skin and the bag. ? Leave the ice on for 30 minutes every two hours at first. Then apply the ice as needed. Activity  Rest as told by your health care provider. ? Avoid sitting for a long time without moving. Get up to take short walks every 1-2 hours. This is important to improve blood flow and breathing. Ask for help if you feel weak or unsteady.  Return to your normal activities as directed by your health care provider. Ask what activities are safe for you.  Do exercises to improve motion and strength in your back (physical therapy), as recommended by your health care provider.  Exercise regularly as directed by your health care provider. General instructions   Do not drink alcohol. Alcohol can interfere with your treatment.  Do not use any products that contain nicotine or tobacco, such as  cigarettes and e-cigarettes. These can delay bone healing. If you need help quitting, ask your health care provider.  Keep all follow-up visits as told by your health care provider. This is important. It can help to prevent permanent injury, disability, and long-lasting (chronic) pain. Contact a health care provider if:  You have a fever.  You develop a cough that makes your pain worse.  Your pain medicine is not helping.  Your pain does not get better over time.  You cannot return to your normal activities as planned or expected. Get help right away if:  Your pain is very bad and it suddenly gets worse.  You are unable to move any body part (paralysis) that is below the level of your injury.  You have numbness, tingling, or weakness in any body part that is below the level of your injury.  You cannot control your bladder or bowels. Summary  A spinal compression fracture is a collapse of the bones that form the spine (vertebrae).  With this type of fracture, the vertebrae become pushed (compressed) into a wedge  shape.  Your symptoms and treatment will depend on the cause and severity of the fracture and how quickly it develops.  Some fractures may heal on their own with supportive care. Fractures that cause the back to become misshapen, cause nerve pain or weakness, or do not respond to other treatment may be treated with surgery. This information is not intended to replace advice given to you by your health care provider. Make sure you discuss any questions you have with your health care provider. Document Revised: 03/25/2018 Document Reviewed: 03/10/2017 Elsevier Patient Education  [place].   Ankle Sprain  An ankle sprain is a stretch or tear in one of the tough tissues (ligaments) that connect the bones in your ankle. An ankle sprain can happen when the ankle rolls outward (inversion sprain) or inward (eversion sprain). What are the causes? This condition is caused by rolling or twisting the ankle. What increases the risk? You are more likely to develop this condition if you play sports. What are the signs or symptoms? Symptoms of this condition include:  Pain in your ankle.  Swelling.  Bruising. This may happen right after you sprain your ankle or 1-2 days later.  Trouble standing or walking. How is this diagnosed? This condition is diagnosed with:  A physical exam. During the exam, your doctor will press on certain parts of your foot and ankle and try to move them in certain ways.  X-ray imaging. These may be taken to see how bad the sprain is and to check for broken bones. How is this treated? This condition may be treated with:  A brace or splint. This is used to keep the ankle from moving until it heals.  An elastic bandage. This is used to support the ankle.  Crutches.  Pain medicine.  Surgery. This may be needed if the sprain is very bad.  Physical therapy. This may help to improve movement in the ankle. Follow these instructions at home: If you have a  brace or a splint:  Wear the brace or splint as told by your doctor. Remove it only as told by your doctor.  Loosen the brace or splint if your toes: ? Tingle. ? Lose feeling (become numb). ? Turn cold and blue.  Keep the brace or splint clean.  If the brace or splint is not waterproof: ? Do not let it get wet. ? Cover it  with a watertight covering when you take a bath or a shower. If you have an elastic bandage (dressing):  Remove it to shower or bathe.  Try not to move your ankle much, but wiggle your toes from time to time. This helps to prevent swelling.  Adjust the dressing if it feels too tight.  Loosen the dressing if your foot: ? Loses feeling. ? Tingles. ? Becomes cold and blue. Managing pain, stiffness, and swelling   Take over-the-counter and prescription medicines only as told by doctor.  For 2-3 days, keep your ankle raised (elevated) above the level of your heart.  If told, put ice on the injured area: ? If you have a removable brace or splint, remove it as told by your doctor. ? Put ice in a plastic bag. ? Place a towel between your skin and the bag. ? Leave the ice on for 20 minutes, 2-3 times a day. General instructions  Rest your ankle.  Do not use your injured leg to support your body weight until your doctor says that you can. Use crutches as told by your doctor.  Do not use any products that contain nicotine or tobacco, such as cigarettes, e-cigarettes, and chewing tobacco. If you need help quitting, ask your doctor.  Keep all follow-up visits as told by your doctor. Contact a doctor if:  Your bruises or swelling are quickly getting worse.  Your pain does not get better after you take medicine. Get help right away if:  You cannot feel your toes or foot.  Your foot or toes look blue.  You have very bad pain that gets worse. Summary  An ankle sprain is a stretch or tear in one of the tough tissues (ligaments) that connect the bones in  your ankle.  This condition is caused by rolling or twisting the ankle.  Symptoms include pain, swelling, bruising, and trouble walking.  To help with pain and swelling, put ice on the injured ankle, raise your ankle above the level of your heart, and use an elastic bandage. Also, rest as told by your doctor.  Keep all follow-up visits as told by your doctor. This is important. This information is not intended to replace advice given to you by your health care provider. Make sure you discuss any questions you have with your health care provider. Document Revised: 06/23/2017 Document Reviewed: 06/23/2017 Elsevier Patient Education  [place].

## 2019-04-29 NOTE — Progress Notes (Signed)
Patient ID: Amber [name], female   DOB: 1934-06-07, 85 y.o.   MRN: [id number]       Subjective: Patient in a good mood this morning.  Doesn't complain of pain except in her right ankle.  Minimal pain in her back it seems; however, patient has significant dementia and quickly forgets.  Waiting on breakfast.  Daughter at bedside  ROS: unable due to dementia  Objective: Vital signs in last 24 hours: Temp:  [97.7 F (36.5 C)-98.9 F (37.2 C)] 97.7 F (36.5 C) (03/19 0900) Pulse Rate:  [65-128] 71 (03/19 0900) Resp:  [16-20] 17 (03/19 0900) BP: (122-179)/(72-147) 159/79 (03/19 0900) SpO2:  [94 %-98 %] 94 % (03/19 0900) Weight:  [67.6 kg] 67.6 kg (03/18 1208) Last BM Date: 04/28/19  Intake/Output from previous day: 03/18 0701 - 03/19 0700 In: 250 [P.O.:250] Out: 200 [Urine:200] Intake/Output this shift: No intake/output data recorded.  PE: Gen: NAD HEENT: PERRL still pinpoint, right scalp ecchymosis still present and stable Heart: irregular Lungs: CTAB Abd: soft, NT, NS MS: MAE, right ankle tenderness to palpation over lateral malleolus and just anterior to this.  Has dorsi and plantar flexion but with mild pain Psych: alert, but pleasantly demented  Lab Results:  Recent Labs    04/28/19 1149 04/29/19 0329  WBC 12.2* 8.2  HGB 14.5 12.3  HCT 47.7* 38.6  PLT 228 195   BMET Recent Labs    04/28/19 1149 04/29/19 0329  NA 141 142  K 3.7 3.0*  CL 99 103  CO2 27 26  GLUCOSE 138* 126*  BUN 8 6*  CREATININE 0.95 0.75  CALCIUM 9.8 8.8*   PT/INR Recent Labs    04/28/19 1149  LABPROT 23.2*  INR 2.1*   CMP     Component Value Date/Time   NA 142 04/29/2019 0329   NA 145 (H) 08/31/2018 1601   K 3.0 (L) 04/29/2019 0329   CL 103 04/29/2019 0329   CO2 26 04/29/2019 0329   GLUCOSE 126 (H) 04/29/2019 0329   BUN 6 (L) 04/29/2019 0329   BUN 17 08/31/2018 1601   CREATININE 0.75 04/29/2019 0329   CREATININE 0.97 (H) 11/09/2015 1326   CALCIUM 8.8 (L) 04/29/2019  0329   PROT 6.5 08/31/2018 1601   ALBUMIN 4.6 08/31/2018 1601   AST 21 08/31/2018 1601   ALT 11 08/31/2018 1601   ALKPHOS 88 08/31/2018 1601   BILITOT 0.4 08/31/2018 1601   GFRNONAA >60 04/29/2019 0329   GFRAA >60 04/29/2019 0329   Lipase  No results found for: LIPASE     Studies/Results: DG Ribs Unilateral W/Chest Left  Result Date: 04/28/2019 CLINICAL DATA:  Pain following fall EXAM: LEFT RIBS AND CHEST - 3+ VIEW COMPARISON:  Chest radiograph March 19, 2017 FINDINGS: Frontal chest as well as oblique and cone-down rib images obtained. There is scarring in the lateral left base with questionable small chronic pleural effusion. There is slight atelectasis in the left mid lung. Lungs elsewhere clear. Heart is upper normal in size with pulmonary vascularity normal. Patient is status post internal mammary bypass grafting. There is aortic atherosclerosis. There are fractures of the anterior left fifth, sixth, seventh, eighth, ninth, and tenth ribs. No evident pneumothorax. IMPRESSION: Multiple mildly displaced left rib fractures. Chronic blunting of the left costophrenic angle with questionable small pleural effusion. No pneumothorax. Lungs are otherwise clear except for mild left midlung atelectasis. Stable cardiac silhouette with postoperative changes. Aortic Atherosclerosis (ICD10-I70.0). Electronically Signed   By: Lowella Grip III M.D.  On: 04/28/2019 12:47   DG Thoracic Spine 2 View  Result Date: 04/28/2019 CLINICAL DATA:  Generalized back pain, fall EXAM: THORACIC SPINE 2 VIEWS COMPARISON:  03/19/2017, 07/31/2016, 10/29/2012 FINDINGS: Moderate to severe compression deformities of the T12 and L1 vertebral bodies, unchanged from 2014. Moderate compression deformity of T7, unchanged from 2018. Mild superior endplate compression deformity of T9 appears new from prior 03/19/2017. No static listhesis. Median sternotomy changes. Calcific aortic knob. Visualized lung fields clear.  IMPRESSION: 1. Mild superior endplate compression deformity of T9 appears new from prior 03/19/2017. Correlate with point tenderness at this level. 2. Moderate to severe compression deformities of T12 and L1 vertebral bodies, unchanged from 2014. 3. Moderate compression deformity of T7, unchanged from 2018. Electronically Signed   By: Davina Poke D.O.   On: 04/28/2019 13:36   DG Ankle Complete Right  Result Date: 04/28/2019 CLINICAL DATA:  Pain following fall EXAM: RIGHT ANKLE - COMPLETE 3+ VIEW COMPARISON:  None. FINDINGS: Frontal, oblique, and lateral views were obtained. There is no fracture or appreciable joint effusion. There is moderate generalized joint space narrowing. No erosion. Ankle mortise appears intact. There is a posterior calcaneal spur. There is calcification in the soft tissues posterior to the ankle mortise. IMPRESSION: Generalized joint space narrowing. No acute fracture. Ankle mortise appears intact. Small posterior calcaneal spur. Probable calcified hematoma in the posterior soft tissues at the ankle mortise level. Electronically Signed   By: Lowella Grip III M.D.   On: 04/28/2019 12:53   CT Head Wo Contrast  Result Date: 04/28/2019 CLINICAL DATA:  [place] down the basement stairs down approximately 15 carpeted steps landing on cement floor, on blood thinners, struck head, no loss of consciousness; history chronic atrial fibrillation, CHF, COPD, coronary artery disease, stroke, stage III chronic kidney disease, hypertension, dementia, former smoker EXAM: CT HEAD WITHOUT CONTRAST CT CERVICAL SPINE WITHOUT CONTRAST TECHNIQUE: Multidetector CT imaging of the head and cervical spine was performed following the standard protocol without intravenous contrast. Multiplanar CT image reconstructions of the cervical spine were also generated. COMPARISON:  CT cervical spine 02/06/2017, CT head 03/05/2017 FINDINGS: CT HEAD FINDINGS Brain: Generalized atrophy with ex vacuo dilatation of the  ventricles. No midline shift or mass effect. Small vessel chronic ischemic changes of deep cerebral white matter. No intracranial hemorrhage, mass lesion or evidence of acute infarction. No extra-axial fluid collections. Vascular: No hyperdense vessels. Atherosclerotic calcification of internal carotid arteries at skull base Skull: Demineralized but intact. Small high RIGHT frontal scalp hematoma. Sinuses/Orbits: Clear. Material within the external auditory canals bilaterally question cerumen. Other: N/A CT CERVICAL SPINE FINDINGS Alignment: Normal Skull base and vertebrae: Osseous demineralization. Mild superior endplate compression fracture of T2 vertebral body with 10-20% anterior height loss new since 02/06/2017. Multilevel disc space narrowing and minor endplate spur formation. Interval mild superior endplate concavity of T3 vertebral body and minimal anterior height loss though this is age-indeterminate. Multilevel facet degenerative changes. No additional fracture, subluxation or bone destruction. Soft tissues and spinal canal: Prevertebral soft tissues normal thickness. Atherosclerotic calcifications of the carotid systems bilaterally, with the carotid arteries extending retropharyngeal bilaterally. Additional atherosclerotic calcifications at the proximal great vessels. Disc levels:  No additional abnormalities Upper chest: Lung apices clear Other: N/A IMPRESSION: Atrophy with small vessel chronic ischemic changes of deep cerebral white matter. No acute intracranial abnormalities. Mild superior endplate compression fracture of T2 vertebral body with 10-20% anterior height loss, appears acute and is new since 02/06/2017. Mild superior endplate concavity and anterior height loss  of T3 vertebral body, age indeterminate. Multilevel degenerative disc and facet disease changes cervical spine. Osseous demineralization. Findings called to Suella Broad PA on 04/28/2019 at 1235 hours. Electronically Signed   By: Lavonia Dana M.D.   On: 04/28/2019 12:36   CT Chest Wo Contrast  Result Date: 04/28/2019 CLINICAL DATA:  Pain following fall EXAM: CT CHEST WITHOUT CONTRAST TECHNIQUE: Multidetector CT imaging of the chest was performed following the standard protocol without IV contrast. COMPARISON:  Chest and rib radiographs April 28, 2019; CT angiogram chest April 28, 2010. Chest radiograph March 19, 2017 FINDINGS: Cardiovascular: There is no thoracic aortic aneurysm. There is aortic atherosclerosis with calcification throughout the thoracic aorta. There is calcification in scattered areas in the visualized great vessels. Patient is status post coronary artery bypass grafting. There is no pericardial effusion or pericardial thickening. Mediastinum/Nodes: Thyroid appears unremarkable. There are scattered subcentimeter lymph nodes without thoracic adenopathy. There is a small hiatal hernia. Lungs/Pleura: There is a small pleural effusion. There is no evident pneumothorax. There is mild tree on bud type appearance in the posterior segment of the right upper lobe. No consolidation. There is slight bibasilar atelectasis. Upper Abdomen: There is upper abdominal aortic atherosclerosis. Visualized upper abdominal structures otherwise appear unremarkable. Musculoskeletal: There is evidence of a prior fracture of the posterior left sixth rib with evidence of healing. There is evidence of a prior fracture of the posterior left seventh rib with evidence of healing. There is a acute appearing displaced fracture of the posterolateral left eighth rib. There is a fracture, mildly displaced, which appears acute involving the posterolateral left ninth rib. There is a fracture involving the lateral left tenth rib. There is an acute fracture involving the lateral left eleventh rib. There is marked wedging of the T7, T12, and L1 vertebral bodies. There is milder wedging along the superior aspect of T9. There is also slight anterior wedging at the  superior endplate of T6. Patient is status post median sternotomy. There has been progression of compression at L1, T9, and T6 compared to the previous study. Other fractures were present on prior chest radiograph from 2019. There are no blastic or lytic bone lesions. No chest wall lesions are evident. IMPRESSION: 1. Several left-sided acute rib fractures. There are also older healed rib fractures on the left. Several vertebral body fractures are noted in the thoracic and upper lumbar region with overall progression of wedging at L1 and to a lesser extent at T9 and T6. Wedge fractures at T7 and T12 were present on previous chest radiograph. 2.  Fairly small left pleural effusion.  No pneumothorax. 3. There is mild tree on bud type appearance in the posterior segment right upper lobe, felt to be indicative of a focal area of pneumonia. No similar foci of pneumonia elsewhere. There is mild bibasilar atelectasis. 4. There is aortic atherosclerosis as well as evidence of prior coronary artery bypass grafting. 5.  Small hiatal hernia. 6.  No evident adenopathy. Aortic Atherosclerosis (ICD10-I70.0). Electronically Signed   By: Lowella Grip III M.D.   On: 04/28/2019 14:53   CT Cervical Spine Wo Contrast  Result Date: 04/28/2019 CLINICAL DATA:  [place] down the basement stairs down approximately 15 carpeted steps landing on cement floor, on blood thinners, struck head, no loss of consciousness; history chronic atrial fibrillation, CHF, COPD, coronary artery disease, stroke, stage III chronic kidney disease, hypertension, dementia, former smoker EXAM: CT HEAD WITHOUT CONTRAST CT CERVICAL SPINE WITHOUT CONTRAST TECHNIQUE: Multidetector CT imaging of the  head and cervical spine was performed following the standard protocol without intravenous contrast. Multiplanar CT image reconstructions of the cervical spine were also generated. COMPARISON:  CT cervical spine 02/06/2017, CT head 03/05/2017 FINDINGS: CT HEAD FINDINGS  Brain: Generalized atrophy with ex vacuo dilatation of the ventricles. No midline shift or mass effect. Small vessel chronic ischemic changes of deep cerebral white matter. No intracranial hemorrhage, mass lesion or evidence of acute infarction. No extra-axial fluid collections. Vascular: No hyperdense vessels. Atherosclerotic calcification of internal carotid arteries at skull base Skull: Demineralized but intact. Small high RIGHT frontal scalp hematoma. Sinuses/Orbits: Clear. Material within the external auditory canals bilaterally question cerumen. Other: N/A CT CERVICAL SPINE FINDINGS Alignment: Normal Skull base and vertebrae: Osseous demineralization. Mild superior endplate compression fracture of T2 vertebral body with 10-20% anterior height loss new since 02/06/2017. Multilevel disc space narrowing and minor endplate spur formation. Interval mild superior endplate concavity of T3 vertebral body and minimal anterior height loss though this is age-indeterminate. Multilevel facet degenerative changes. No additional fracture, subluxation or bone destruction. Soft tissues and spinal canal: Prevertebral soft tissues normal thickness. Atherosclerotic calcifications of the carotid systems bilaterally, with the carotid arteries extending retropharyngeal bilaterally. Additional atherosclerotic calcifications at the proximal great vessels. Disc levels:  No additional abnormalities Upper chest: Lung apices clear Other: N/A IMPRESSION: Atrophy with small vessel chronic ischemic changes of deep cerebral white matter. No acute intracranial abnormalities. Mild superior endplate compression fracture of T2 vertebral body with 10-20% anterior height loss, appears acute and is new since 02/06/2017. Mild superior endplate concavity and anterior height loss of T3 vertebral body, age indeterminate. Multilevel degenerative disc and facet disease changes cervical spine. Osseous demineralization. Findings called to Suella Broad PA on  04/28/2019 at 1235 hours. Electronically Signed   By: Lavonia Dana M.D.   On: 04/28/2019 12:36   CT LUMBAR SPINE WO CONTRAST  Result Date: 04/28/2019 CLINICAL DATA:  Low back pain. Fall down stairs. EXAM: CT LUMBAR SPINE WITHOUT CONTRAST TECHNIQUE: Multidetector CT imaging of the lumbar spine was performed without intravenous contrast administration. Multiplanar CT image reconstructions were also generated. COMPARISON:  None. FINDINGS: Segmentation: Normal Alignment: Normal Vertebrae: There are minimally displaced fractures of the left transverse processes of L2, L3 and L4. Chronic height loss at T12 and L1. Paraspinal and other soft tissues: Calcific aortic atherosclerosis and left renal atrophy. Disc levels: No bony spinal canal stenosis. The lumbar neural foramina are patent. IMPRESSION: Minimally displaced fractures of the left transverse processes of L2, L3 and L4. Electronically Signed   By: Ulyses Jarred M.D.   On: 04/28/2019 20:52   DG Chest Port 1 View  Result Date: 04/29/2019 CLINICAL DATA:  Recent fall EXAM: PORTABLE CHEST 1 VIEW COMPARISON:  Chest and ribs April 28, 2019; chest CT April 28, 2019 FINDINGS: There is a small left pleural effusion with mild left base atelectasis. No edema or airspace opacity. Heart is upper normal in size. The pulmonary vascularity is within normal limits. Patient is status post internal mammary bypass grafting. There is aortic atherosclerosis. Rib fractures on the left are better seen on recent rib series. No pneumothorax. IMPRESSION: Small left pleural effusion with left base atelectasis, essentially stable. Lungs otherwise clear. Stable cardiac silhouette. No evident pneumothorax. Electronically Signed   By: Lowella Grip III M.D.   On: 04/29/2019 08:01    Anti-infectives: Anti-infectives (From admission, onward)   None       Assessment/Plan Fall down steps Left posterior 8-11 rib fxs - pain  control, pulm toilet, IS, PT/OT T2 endplate compression  fracture - NSGY saw and recommended TLSO.  Daughter prefers follow up with Dr. Estanislado Pandy if desired as outpatient. Multiple worsening and chronic compression fractures - pain control Right ankle pain - films were negative, but given tenderness and daughter states inability to WB very well, will order an air cast for stabilization incase she has a sprain. She can follow up with ortho as an outpatient. Dementia - cont home meds H/O CVA - on plavix at home with left-sided hemiparesis (not complete, but wears brace for significant supination of left ankle) Chronic A fib - continue coumadin, daily PT/INR CAD - continue home meds HTN - resume home meds COPD - resume home meds FEN - regular diet/MIVFs VTE - coumadin/plavix ID - none currently Dispo - awaiting PT/OT recommendations, pending these possible DC today    LOS: 1 day    Henreitta Cea , [facility] 04/29/2019, 9:28 AM Please see Amion for pager number during day hours 7:00am-4:30pm or 7:00am -11:30am on weekends

## 2019-04-29 NOTE — Plan of Care (Signed)
  Problem: Clinical Measurements: Goal: Respiratory complications will improve Outcome: Progressing   Problem: Activity: Goal: Risk for activity intolerance will decrease Outcome: Progressing   Problem: Nutrition: Goal: Adequate nutrition will be maintained Outcome: Progressing   Problem: Coping: Goal: Level of anxiety will decrease Outcome: Progressing   Problem: Pain Managment: Goal: General experience of comfort will improve Outcome: Progressing   

## 2019-04-29 NOTE — Care Management CC44 (Signed)
Condition Code 44 Documentation Completed  Patient Details  Name: Amber [name] MRN: [id number] Date of Birth: 05-22-1934   Condition Code 44 given:   yes Patient signature on Condition Code 44 notice:   yes Documentation of 2 MD's agreement:   yes Code 44 added to claim:   yes    Ella Bodo, RN 04/29/2019, 4:12 PM

## 2019-04-29 NOTE — Progress Notes (Signed)
  NEUROSURGERY PROGRESS NOTE   No issues overnight. Back pain minimal. Biggest concern is right ankle pain No new N/T/W  EXAM:  BP 122/78 (BP Location: Right Arm)   Pulse 74   Temp 98.7 F (37.1 C) (Oral)   Resp 17   Ht 5\' 5"  (1.651 m)   Wt 67.6 kg   SpO2 98%   BMI 24.79 kg/m   Awake, alert Speech fluent, appropriate  CN grossly intact  MAEW, stable motor with chronic left hemiparesis  IMPRESSION/PLAN 85 y.o. female with multiple rib fractures, Acute on chronic insufficiency fractures at T2, T6, T9, L1 & left L2-4 TP fractures after a fall yesterday. She is at neurologic baseline. No indication for NS intervention.  - PT/OT  - TLSO when upright and OOB - No need for NS f/u as patient's daughter prefers Dr Estanislado Pandy for vertebral augmentation should it become necessary. She can f/u with her PCP for pain control.  Please call for any concerns.

## 2019-04-29 NOTE — Plan of Care (Signed)
  Problem: Activity: Goal: Risk for activity intolerance will decrease Outcome: Progressing   

## 2019-04-29 NOTE — TOC Initial Note (Signed)
Transition of Care [facility]) - Initial/Assessment Note    Patient Details  Name: Amber [name] MRN: [id number] Date of Birth: [birth year]  Transition of Care [facility]) CM/SW Contact:    Ella Bodo, RN Phone Number: 04/29/2019, 4:56 PM  Clinical Narrative:  85 yo admitted after fall down stairs with Left rib fx T2,6,9 compression fx of acute on chronic insufficiency, L1 fx. PMhx: CVA with left weakness, dementia, COPD, CAD, CHF, AFib, T7 &12 chronic fx.   PTA, pt independent, lives at home with spouse.  Daughter states she and her sister will provide 24h care at discharge when first discharged.  No DME needs, per pt/therapies.  [facility] to [facility] needs.    SBIRT completed; pt denies ETOH use or need for cessation resources.                  Expected Discharge Plan: [place] Barriers to Discharge: Continued Medical Work up   Patient Goals and CMS Choice   CMS Medicare.gov Compare Post Acute Care list provided to:: Patient Choice offered to / list presented to : Adult Children  Expected Discharge Plan and Services Expected Discharge Plan: [place]   Discharge Planning Services: CM Consult Post Acute Care Choice: [place] arrangements for the past 2 months: Single Family Home                           HH Arranged: PT, OT [place] Agency: [facility]   Time [place]: 1655 Representative spoke with at [place]: Location manager  Prior Living Arrangements/Services Living arrangements for the past 2 months: [place] with:: Spouse Patient language and need for interpreter reviewed:: Yes Do you feel safe going back to the place where you live?: Yes      Need for Family Participation in Patient Care: Yes (Comment) Care giver support system in place?: Yes (comment) Current home services: DME Criminal Activity/Legal Involvement Pertinent to Current  Situation/Hospitalization: No - Comment as needed  Activities of Daily Living Home Assistive Devices/Equipment: [place] Rile (specify type) ADL Screening (condition at time of admission) Patient's cognitive ability adequate to safely complete daily activities?: No Is the patient deaf or have difficulty hearing?: No Does the patient have difficulty seeing, even when wearing glasses/contacts?: No Does the patient have difficulty concentrating, remembering, or making decisions?: Yes Patient able to express need for assistance with ADLs?: Yes Does the patient have difficulty dressing or bathing?: No Independently performs ADLs?: No Communication: Independent Dressing (OT): Needs assistance Is this a change from baseline?: Pre-admission baseline Grooming: Independent Feeding: Independent Bathing: Independent Toileting: Independent In/Out Bed: Independent Walks in Home: Needs assistance Is this a change from baseline?: Pre-admission baseline Does the patient have difficulty walking or climbing stairs?: No Weakness of Legs: Left Weakness of Arms/Hands: Left  Permission Sought/Granted         Permission granted to share info w AGENCY: [facility]        Emotional Assessment Appearance:: Appears stated age Attitude/Demeanor/Rapport: Gracious Affect (typically observed): Accepting Orientation: : Oriented to Self, Oriented to Place, Oriented to  Time, Oriented to Situation Alcohol / Substance Use: Not Applicable Psych Involvement: No (comment)  Admission diagnosis:  Trauma [T14.90XA] Left rib fracture [S22.32XA] Contusion of scalp, initial encounter [S00.03XA] Fall, initial encounter [W19.XXXA] Closed fracture of multiple ribs of left side, initial encounter [S22.42XA] Compression  fracture of body of thoracic vertebra ([place]) [S22.000A] Sprain of right ankle, unspecified ligament, initial encounter [S93.401A] Patient Active Problem List   Diagnosis Date Noted  . Fall (on) (from)  other stairs and steps, initial encounter 04/28/2019  . Left rib fracture 04/28/2019  . Compression fracture of body of thoracic vertebra ([place]) 04/28/2019  . Acute pulmonary edema (HCC)   . Palliative care encounter   . Acute on chronic diastolic (congestive) heart failure ([place]) 03/04/2017  . COPD with acute exacerbation ([place]) 02/06/2017  . Acute on chronic respiratory failure with hypoxia ([place]) 02/06/2017  . Dementia ([place]) 02/06/2017  . Encounter for therapeutic drug monitoring 10/20/2016  . Cerebrovascular disease 05/21/2016  . Hemiparesis and alteration of sensations as late effects of stroke ([place]) 01/17/2015  . Memory change 01/23/2014  . 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 Long term current use of anticoagulant therapy   . Streptococcal pneumonia ([place]) 02/12/2013  . Vertebral artery stenosis 12/18/2011  . Fibromyalgia 05/10/2010  . Depression 05/10/2010  . Hypothyroidism 05/10/2010  . Osteoporosis 05/10/2010  . Coronary artery disease involving coronary bypass graft of native heart with angina pectoris ([place]) 05/10/2010  . Chronic diastolic heart failure ([place])   . Dyslipidemia   . Atrial fibrillation ([place]) 05/06/2010   PCP:  Antony Contras, MD Pharmacy:   [id number] - [place], [place] [address] [place] [address] Phone: [phone] Fax: [phone]     Social Determinants of Health (SDOH) Interventions    Readmission Risk Interventions No flowsheet data found.   Reinaldo Raddle, RN, BSN  Trauma/Neuro ICU Case Manager [phone]

## 2019-04-30 ENCOUNTER — Observation Stay (HOSPITAL_COMMUNITY): Payer: Medicare PPO

## 2019-04-30 LAB — BASIC METABOLIC PANEL
Anion gap: 9 (ref 5–15)
BUN: 12 mg/dL (ref 8–23)
CO2: 29 mmol/L (ref 22–32)
Calcium: 8.8 mg/dL — ABNORMAL LOW (ref 8.9–10.3)
Chloride: 102 mmol/L (ref 98–111)
Creatinine, Ser: 1.04 mg/dL — ABNORMAL HIGH (ref 0.44–1.00)
GFR calc Af Amer: 58 mL/min — ABNORMAL LOW (ref >60)
GFR calc non Af Amer: 50 mL/min — ABNORMAL LOW (ref >60)
Glucose, Bld: 116 mg/dL — ABNORMAL HIGH (ref 70–99)
Potassium: 4 mmol/L (ref 3.5–5.1)
Sodium: 140 mmol/L (ref 135–145)

## 2019-04-30 LAB — PROTIME-INR
INR: 2.7 — ABNORMAL HIGH (ref 0.8–1.2)
Prothrombin Time: 28.7 s — ABNORMAL HIGH (ref 11.4–15.2)

## 2019-04-30 MED ORDER — TRAMADOL HCL 50 MG PO TABS: 50.0000 mg | ORAL_TABLET | Freq: Four times a day (QID) | ORAL | 0 refills | Status: DC | PRN

## 2019-04-30 NOTE — Progress Notes (Signed)
Patient ID: Amber [name], female   DOB: 04/12/1934, 85 y.o.   MRN: [id number]  [facility] Surgery Progress Note:   * No surgery found *  Subjective: Mental status is responsive and appropriate Objective: Vital signs in last 24 hours: Temp:  [98 F (36.7 C)-98.3 F (36.8 C)] 98.2 F (36.8 C) (03/20 0406) Pulse Rate:  [58-60] 58 (03/20 0406) Resp:  [15-18] 16 (03/20 0406) BP: (116-148)/(43-65) 148/65 (03/20 0406) SpO2:  [97 %-99 %] 97 % (03/20 0406)  Intake/Output from previous day: No intake/output data recorded. Intake/Output this shift: Total I/O In: 240 [P.O.:240] Out: -   Physical Exam: Work of breathing is challenged by PT in progress.  Up with assistance  Lab Results:  Results for orders placed or performed during the hospital encounter of 04/28/19 (from the past 48 hour(s))  CBG monitoring, ED     Status: Abnormal   Collection Time: 04/28/19 11:45 AM  Result Value Ref Range   Glucose-Capillary 122 (H) 70 - 99 mg/dL    Comment: Glucose reference range applies only to samples taken after fasting for at least 8 hours.   Comment 1 Notify RN    Comment 2 Document in Chart   Basic metabolic panel     Status: Abnormal   Collection Time: 04/28/19 11:49 AM  Result Value Ref Range   Sodium 141 135 - 145 mmol/L   Potassium 3.7 3.5 - 5.1 mmol/L   Chloride 99 98 - 111 mmol/L   CO2 27 22 - 32 mmol/L   Glucose, Bld 138 (H) 70 - 99 mg/dL    Comment: Glucose reference range applies only to samples taken after fasting for at least 8 hours.   BUN 8 8 - 23 mg/dL   Creatinine, Ser 0.95 0.44 - 1.00 mg/dL   Calcium 9.8 8.9 - 10.3 mg/dL   GFR calc non Af Amer 56 (L) >60 mL/min   GFR calc Af Amer >60 >60 mL/min   Anion gap 15 5 - 15    Comment: Performed at [facility] [address], [place], [place] [address]  CBC with Differential     Status: Abnormal   Collection Time: 04/28/19 11:49 AM  Result Value Ref Range   WBC 12.2 (H) 4.0 - 10.5 K/uL   RBC 4.93 3.87 - 5.11  MIL/uL   Hemoglobin 14.5 12.0 - 15.0 g/dL   HCT 47.7 (H) 36.0 - 46.0 %   MCV 96.8 80.0 - 100.0 fL   MCH 29.4 26.0 - 34.0 pg   MCHC 30.4 30.0 - 36.0 g/dL   RDW 15.4 11.5 - 15.5 %   Platelets 228 150 - 400 K/uL   nRBC 0.0 0.0 - 0.2 %   Neutrophils Relative % 78 %   Neutro Abs 9.8 (H) 1.7 - 7.7 K/uL   Lymphocytes Relative 13 %   Lymphs Abs 1.6 0.7 - 4.0 K/uL   Monocytes Relative 6 %   Monocytes Absolute 0.7 0.1 - 1.0 K/uL   Eosinophils Relative 1 %   Eosinophils Absolute 0.1 0.0 - 0.5 K/uL   Basophils Relative 1 %   Basophils Absolute 0.1 0.0 - 0.1 K/uL   Immature Granulocytes 1 %   Abs Immature Granulocytes 0.07 0.00 - 0.07 K/uL    Comment: Performed at [facility] [address], [place], [place] [address]  Protime-INR     Status: Abnormal   Collection Time: 04/28/19 11:49 AM  Result Value Ref Range   Prothrombin Time  23.2 (H) 11.4 - 15.2 seconds   INR 2.1 (H) 0.8 - 1.2    Comment: (NOTE) INR goal varies based on device and disease states. Performed at [facility], [place] [address], [place], [place] [address]   SARS CORONAVIRUS 2 (TAT 6-24 HRS) Nasopharyngeal Nasopharyngeal Swab     Status: None   Collection Time: 04/28/19  4:24 PM   Specimen: Nasopharyngeal Swab  Result Value Ref Range   SARS Coronavirus 2 NEGATIVE NEGATIVE    Comment: (NOTE) SARS-CoV-2 target nucleic acids are NOT DETECTED. The SARS-CoV-2 RNA is generally detectable in upper and lower respiratory specimens during the acute phase of infection. Negative results do not preclude SARS-CoV-2 infection, do not rule out co-infections with other pathogens, and should not be used as the sole basis for treatment or other patient management decisions. Negative results must be combined with clinical observations, patient history, and epidemiological information. The expected result is Negative. Fact Sheet for Patients: SugarRoll.be Fact Sheet for Healthcare  Providers: https://www.woods-mathews.com/ This test is not yet approved or cleared by the [place] FDA and  has been authorized for detection and/or diagnosis of SARS-CoV-2 by FDA under an Emergency Use Authorization (EUA). This EUA will remain  in effect (meaning this test can be used) for the duration of the COVID-19 declaration under Section 56 4(b)(1) of the Act, 21 U.S.C. section 360bbb-3(b)(1), unless the authorization is terminated or revoked sooner. Performed at [facility], [place] [address], [place], [place] [address]   CBC     Status: None   Collection Time: 04/29/19  3:29 AM  Result Value Ref Range   WBC 8.2 4.0 - 10.5 K/uL   RBC 4.21 3.87 - 5.11 MIL/uL   Hemoglobin 12.3 12.0 - 15.0 g/dL   HCT 38.6 36.0 - 46.0 %   MCV 91.7 80.0 - 100.0 fL   MCH 29.2 26.0 - 34.0 pg   MCHC 31.9 30.0 - 36.0 g/dL   RDW 15.2 11.5 - 15.5 %   Platelets 195 150 - 400 K/uL   nRBC 0.0 0.0 - 0.2 %    Comment: Performed at [facility], [place] [address], [place], [place] [id number]  Basic metabolic panel     Status: Abnormal   Collection Time: 04/29/19  3:29 AM  Result Value Ref Range   Sodium 142 135 - 145 mmol/L   Potassium 3.0 (L) 3.5 - 5.1 mmol/L   Chloride 103 98 - 111 mmol/L   CO2 26 22 - 32 mmol/L   Glucose, Bld 126 (H) 70 - 99 mg/dL    Comment: Glucose reference range applies only to samples taken after fasting for at least 8 hours.   BUN 6 (L) 8 - 23 mg/dL   Creatinine, Ser 0.75 0.44 - 1.00 mg/dL   Calcium 8.8 (L) 8.9 - 10.3 mg/dL   GFR calc non Af Amer >60 >60 mL/min   GFR calc Af Amer >60 >60 mL/min   Anion gap 13 5 - 15    Comment: Performed at [facility] [address], [place], [place] [id number]  Basic metabolic panel     Status: Abnormal   Collection Time: 04/30/19  3:38 AM  Result Value Ref Range   Sodium 140 135 - 145 mmol/L   Potassium 4.0 3.5 - 5.1 mmol/L   Chloride 102 98 - 111 mmol/L   CO2 29 22 - 32 mmol/L   Glucose, Bld 116 (H) 70  - 99 mg/dL  Comment: Glucose reference range applies only to samples taken after fasting for at least 8 hours.   BUN 12 8 - 23 mg/dL   Creatinine, Ser 1.04 (H) 0.44 - 1.00 mg/dL   Calcium 8.8 (L) 8.9 - 10.3 mg/dL   GFR calc non Af Amer 50 (L) >60 mL/min   GFR calc Af Amer 58 (L) >60 mL/min   Anion gap 9 5 - 15    Comment: Performed at [facility] [address] [place], [place] [address]  Protime-INR     Status: Abnormal   Collection Time: 04/30/19  3:38 AM  Result Value Ref Range   Prothrombin Time 28.7 (H) 11.4 - 15.2 seconds   INR 2.7 (H) 0.8 - 1.2    Comment: (NOTE) INR goal varies based on device and disease states. Performed at [facility], [place] [address], [place], [place] [address]     Radiology/Results: DG Ribs Unilateral W/Chest Left  Result Date: 04/28/2019 CLINICAL DATA:  Pain following fall EXAM: LEFT RIBS AND CHEST - 3+ VIEW COMPARISON:  Chest radiograph March 19, 2017 FINDINGS: Frontal chest as well as oblique and cone-down rib images obtained. There is scarring in the lateral left base with questionable small chronic pleural effusion. There is slight atelectasis in the left mid lung. Lungs elsewhere clear. Heart is upper normal in size with pulmonary vascularity normal. Patient is status post internal mammary bypass grafting. There is aortic atherosclerosis. There are fractures of the anterior left fifth, sixth, seventh, eighth, ninth, and tenth ribs. No evident pneumothorax. IMPRESSION: Multiple mildly displaced left rib fractures. Chronic blunting of the left costophrenic angle with questionable small pleural effusion. No pneumothorax. Lungs are otherwise clear except for mild left midlung atelectasis. Stable cardiac silhouette with postoperative changes. Aortic Atherosclerosis (ICD10-I70.0). Electronically Signed   By: Lowella Grip III M.D.   On: 04/28/2019 12:47   DG Thoracic Spine 2 View  Result Date: 04/28/2019 CLINICAL DATA:  Generalized back  pain, fall EXAM: THORACIC SPINE 2 VIEWS COMPARISON:  03/19/2017, 07/31/2016, 10/29/2012 FINDINGS: Moderate to severe compression deformities of the T12 and L1 vertebral bodies, unchanged from 2014. Moderate compression deformity of T7, unchanged from 2018. Mild superior endplate compression deformity of T9 appears new from prior 03/19/2017. No static listhesis. Median sternotomy changes. Calcific aortic knob. Visualized lung fields clear. IMPRESSION: 1. Mild superior endplate compression deformity of T9 appears new from prior 03/19/2017. Correlate with point tenderness at this level. 2. Moderate to severe compression deformities of T12 and L1 vertebral bodies, unchanged from 2014. 3. Moderate compression deformity of T7, unchanged from 2018. Electronically Signed   By: Davina Poke D.O.   On: 04/28/2019 13:36   DG Ankle Complete Right  Result Date: 04/28/2019 CLINICAL DATA:  Pain following fall EXAM: RIGHT ANKLE - COMPLETE 3+ VIEW COMPARISON:  None. FINDINGS: Frontal, oblique, and lateral views were obtained. There is no fracture or appreciable joint effusion. There is moderate generalized joint space narrowing. No erosion. Ankle mortise appears intact. There is a posterior calcaneal spur. There is calcification in the soft tissues posterior to the ankle mortise. IMPRESSION: Generalized joint space narrowing. No acute fracture. Ankle mortise appears intact. Small posterior calcaneal spur. Probable calcified hematoma in the posterior soft tissues at the ankle mortise level. Electronically Signed   By: Lowella Grip III M.D.   On: 04/28/2019 12:53   CT Head Wo Contrast  Result Date: 04/28/2019 CLINICAL DATA:  [place] down the basement stairs down approximately 15 carpeted steps  landing on cement floor, on blood thinners, struck head, no loss of consciousness; history chronic atrial fibrillation, CHF, COPD, coronary artery disease, stroke, stage III chronic kidney disease, hypertension, dementia, former  smoker EXAM: CT HEAD WITHOUT CONTRAST CT CERVICAL SPINE WITHOUT CONTRAST TECHNIQUE: Multidetector CT imaging of the head and cervical spine was performed following the standard protocol without intravenous contrast. Multiplanar CT image reconstructions of the cervical spine were also generated. COMPARISON:  CT cervical spine 02/06/2017, CT head 03/05/2017 FINDINGS: CT HEAD FINDINGS Brain: Generalized atrophy with ex vacuo dilatation of the ventricles. No midline shift or mass effect. Small vessel chronic ischemic changes of deep cerebral white matter. No intracranial hemorrhage, mass lesion or evidence of acute infarction. No extra-axial fluid collections. Vascular: No hyperdense vessels. Atherosclerotic calcification of internal carotid arteries at skull base Skull: Demineralized but intact. Small high RIGHT frontal scalp hematoma. Sinuses/Orbits: Clear. Material within the external auditory canals bilaterally question cerumen. Other: N/A CT CERVICAL SPINE FINDINGS Alignment: Normal Skull base and vertebrae: Osseous demineralization. Mild superior endplate compression fracture of T2 vertebral body with 10-20% anterior height loss new since 02/06/2017. Multilevel disc space narrowing and minor endplate spur formation. Interval mild superior endplate concavity of T3 vertebral body and minimal anterior height loss though this is age-indeterminate. Multilevel facet degenerative changes. No additional fracture, subluxation or bone destruction. Soft tissues and spinal canal: Prevertebral soft tissues normal thickness. Atherosclerotic calcifications of the carotid systems bilaterally, with the carotid arteries extending retropharyngeal bilaterally. Additional atherosclerotic calcifications at the proximal great vessels. Disc levels:  No additional abnormalities Upper chest: Lung apices clear Other: N/A IMPRESSION: Atrophy with small vessel chronic ischemic changes of deep cerebral white matter. No acute intracranial  abnormalities. Mild superior endplate compression fracture of T2 vertebral body with 10-20% anterior height loss, appears acute and is new since 02/06/2017. Mild superior endplate concavity and anterior height loss of T3 vertebral body, age indeterminate. Multilevel degenerative disc and facet disease changes cervical spine. Osseous demineralization. Findings called to Suella Broad PA on 04/28/2019 at 1235 hours. Electronically Signed   By: Lavonia Dana M.D.   On: 04/28/2019 12:36   CT Chest Wo Contrast  Result Date: 04/28/2019 CLINICAL DATA:  Pain following fall EXAM: CT CHEST WITHOUT CONTRAST TECHNIQUE: Multidetector CT imaging of the chest was performed following the standard protocol without IV contrast. COMPARISON:  Chest and rib radiographs April 28, 2019; CT angiogram chest April 28, 2010. Chest radiograph March 19, 2017 FINDINGS: Cardiovascular: There is no thoracic aortic aneurysm. There is aortic atherosclerosis with calcification throughout the thoracic aorta. There is calcification in scattered areas in the visualized great vessels. Patient is status post coronary artery bypass grafting. There is no pericardial effusion or pericardial thickening. Mediastinum/Nodes: Thyroid appears unremarkable. There are scattered subcentimeter lymph nodes without thoracic adenopathy. There is a small hiatal hernia. Lungs/Pleura: There is a small pleural effusion. There is no evident pneumothorax. There is mild tree on bud type appearance in the posterior segment of the right upper lobe. No consolidation. There is slight bibasilar atelectasis. Upper Abdomen: There is upper abdominal aortic atherosclerosis. Visualized upper abdominal structures otherwise appear unremarkable. Musculoskeletal: There is evidence of a prior fracture of the posterior left sixth rib with evidence of healing. There is evidence of a prior fracture of the posterior left seventh rib with evidence of healing. There is a acute appearing  displaced fracture of the posterolateral left eighth rib. There is a fracture, mildly displaced, which appears acute involving the posterolateral left ninth rib. There  is a fracture involving the lateral left tenth rib. There is an acute fracture involving the lateral left eleventh rib. There is marked wedging of the T7, T12, and L1 vertebral bodies. There is milder wedging along the superior aspect of T9. There is also slight anterior wedging at the superior endplate of T6. Patient is status post median sternotomy. There has been progression of compression at L1, T9, and T6 compared to the previous study. Other fractures were present on prior chest radiograph from 2019. There are no blastic or lytic bone lesions. No chest wall lesions are evident. IMPRESSION: 1. Several left-sided acute rib fractures. There are also older healed rib fractures on the left. Several vertebral body fractures are noted in the thoracic and upper lumbar region with overall progression of wedging at L1 and to a lesser extent at T9 and T6. Wedge fractures at T7 and T12 were present on previous chest radiograph. 2.  Fairly small left pleural effusion.  No pneumothorax. 3. There is mild tree on bud type appearance in the posterior segment right upper lobe, felt to be indicative of a focal area of pneumonia. No similar foci of pneumonia elsewhere. There is mild bibasilar atelectasis. 4. There is aortic atherosclerosis as well as evidence of prior coronary artery bypass grafting. 5.  Small hiatal hernia. 6.  No evident adenopathy. Aortic Atherosclerosis (ICD10-I70.0). Electronically Signed   By: Lowella Grip III M.D.   On: 04/28/2019 14:53   CT Cervical Spine Wo Contrast  Result Date: 04/28/2019 CLINICAL DATA:  [place] down the basement stairs down approximately 15 carpeted steps landing on cement floor, on blood thinners, struck head, no loss of consciousness; history chronic atrial fibrillation, CHF, COPD, coronary artery disease,  stroke, stage III chronic kidney disease, hypertension, dementia, former smoker EXAM: CT HEAD WITHOUT CONTRAST CT CERVICAL SPINE WITHOUT CONTRAST TECHNIQUE: Multidetector CT imaging of the head and cervical spine was performed following the standard protocol without intravenous contrast. Multiplanar CT image reconstructions of the cervical spine were also generated. COMPARISON:  CT cervical spine 02/06/2017, CT head 03/05/2017 FINDINGS: CT HEAD FINDINGS Brain: Generalized atrophy with ex vacuo dilatation of the ventricles. No midline shift or mass effect. Small vessel chronic ischemic changes of deep cerebral white matter. No intracranial hemorrhage, mass lesion or evidence of acute infarction. No extra-axial fluid collections. Vascular: No hyperdense vessels. Atherosclerotic calcification of internal carotid arteries at skull base Skull: Demineralized but intact. Small high RIGHT frontal scalp hematoma. Sinuses/Orbits: Clear. Material within the external auditory canals bilaterally question cerumen. Other: N/A CT CERVICAL SPINE FINDINGS Alignment: Normal Skull base and vertebrae: Osseous demineralization. Mild superior endplate compression fracture of T2 vertebral body with 10-20% anterior height loss new since 02/06/2017. Multilevel disc space narrowing and minor endplate spur formation. Interval mild superior endplate concavity of T3 vertebral body and minimal anterior height loss though this is age-indeterminate. Multilevel facet degenerative changes. No additional fracture, subluxation or bone destruction. Soft tissues and spinal canal: Prevertebral soft tissues normal thickness. Atherosclerotic calcifications of the carotid systems bilaterally, with the carotid arteries extending retropharyngeal bilaterally. Additional atherosclerotic calcifications at the proximal great vessels. Disc levels:  No additional abnormalities Upper chest: Lung apices clear Other: N/A IMPRESSION: Atrophy with small vessel chronic  ischemic changes of deep cerebral white matter. No acute intracranial abnormalities. Mild superior endplate compression fracture of T2 vertebral body with 10-20% anterior height loss, appears acute and is new since 02/06/2017. Mild superior endplate concavity and anterior height loss of T3 vertebral body, age indeterminate. Multilevel  degenerative disc and facet disease changes cervical spine. Osseous demineralization. Findings called to Suella Broad PA on 04/28/2019 at 1235 hours. Electronically Signed   By: Lavonia Dana M.D.   On: 04/28/2019 12:36   CT LUMBAR SPINE WO CONTRAST  Result Date: 04/28/2019 CLINICAL DATA:  Low back pain. Fall down stairs. EXAM: CT LUMBAR SPINE WITHOUT CONTRAST TECHNIQUE: Multidetector CT imaging of the lumbar spine was performed without intravenous contrast administration. Multiplanar CT image reconstructions were also generated. COMPARISON:  None. FINDINGS: Segmentation: Normal Alignment: Normal Vertebrae: There are minimally displaced fractures of the left transverse processes of L2, L3 and L4. Chronic height loss at T12 and L1. Paraspinal and other soft tissues: Calcific aortic atherosclerosis and left renal atrophy. Disc levels: No bony spinal canal stenosis. The lumbar neural foramina are patent. IMPRESSION: Minimally displaced fractures of the left transverse processes of L2, L3 and L4. Electronically Signed   By: Ulyses Jarred M.D.   On: 04/28/2019 20:52   DG Chest Port 1 View  Result Date: 04/29/2019 CLINICAL DATA:  Recent fall EXAM: PORTABLE CHEST 1 VIEW COMPARISON:  Chest and ribs April 28, 2019; chest CT April 28, 2019 FINDINGS: There is a small left pleural effusion with mild left base atelectasis. No edema or airspace opacity. Heart is upper normal in size. The pulmonary vascularity is within normal limits. Patient is status post internal mammary bypass grafting. There is aortic atherosclerosis. Rib fractures on the left are better seen on recent rib series. No  pneumothorax. IMPRESSION: Small left pleural effusion with left base atelectasis, essentially stable. Lungs otherwise clear. Stable cardiac silhouette. No evident pneumothorax. Electronically Signed   By: Lowella Grip III M.D.   On: 04/29/2019 08:01    Anti-infectives: Anti-infectives (From admission, onward)   None      Assessment/Plan: Problem List: Patient Active Problem List   Diagnosis Date Noted  . Fall (on) (from) other stairs and steps, initial encounter 04/28/2019  . Left rib fracture 04/28/2019  . Compression fracture of body of thoracic vertebra ([place]) 04/28/2019  . Acute pulmonary edema (HCC)   . Palliative care encounter   . Acute on chronic diastolic (congestive) heart failure ([place]) 03/04/2017  . COPD with acute exacerbation ([place]) 02/06/2017  . Acute on chronic respiratory failure with hypoxia ([place]) 02/06/2017  . Dementia ([place]) 02/06/2017  . Encounter for therapeutic drug monitoring 10/20/2016  . Cerebrovascular disease 05/21/2016  . Hemiparesis and alteration of sensations as late effects of stroke ([place]) 01/17/2015  . Memory change 01/23/2014  . 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 Long term current use of anticoagulant therapy   . Streptococcal pneumonia ([place]) 02/12/2013  . Vertebral artery stenosis 12/18/2011  . Fibromyalgia 05/10/2010  . Depression 05/10/2010  . Hypothyroidism 05/10/2010  . Osteoporosis 05/10/2010  . Coronary artery disease involving coronary bypass graft of native heart with angina pectoris ([place]) 05/10/2010  . Chronic diastolic heart failure ([name])   . Dyslipidemia   . Atrial fibrillation ([place]) 05/06/2010    PT and OT arranged for discharge plans for later today.  Daughter expressing concerned that CXR be ok before discharge.  2 view CXR ordered and if OK then will have discharge later today.   * No surgery found *    LOS: 1 day   Matt B. Hassell Done, MD, [facility] [phone]  beeper [phone]  04/30/2019 11:01 AM

## 2019-04-30 NOTE — Progress Notes (Signed)
Physical Therapy Treatment Patient Details Name: Amber [name] MRN: [id number] DOB: [birth year] Today's Date: 04/30/2019    History of Present Illness 85 yo admitted after fall down stairs with Left rib fx T2,6,9 compression fx of acute on chronic insufficiency, L1 fx. PMhx: CVA with left weakness, dementia, COPD, CAD, CHF, AFib, T7 &12 chronic fx    PT Comments    Therapy reviewed proper don/dofficng of the brace. She had questions about when to wear the brace. Therapy advised her when she is up but follow MD recommendation. She was able to ambulate 20'. She continues to require assist to stand but less assist then yesterday.   Follow Up Recommendations  Home health PT;Supervision/Assistance - 24 hour;SNF     Equipment Recommendations  None recommended by PT    Recommendations for Other Services Rehab consult     Precautions / Restrictions Precautions Precautions: Fall;Back Required Braces or Orthoses: Spinal Brace;Other Brace Spinal Brace: Thoracolumbosacral orthotic;Applied in sitting position Other Brace: double upright AFO Lt, air cast Rt  Restrictions Weight Bearing Restrictions: No    Mobility  Bed Mobility               General bed mobility comments: patient found in the chair   Transfers Overall transfer level: Needs assistance Equipment used: Rolling walker (2 wheeled) Transfers: Sit to/from [employer] Transfers Sit to Stand: +2 physical assistance;+2 safety/equipment;Mod assist         General transfer comment: the patient has difficulty bending forward with the brace. She required mod a to come to a standing position. Once standing she was able to maintain standing with Min gaurd   Ambulation/Gait Ambulation/Gait assistance: Min guard Gait Distance (Feet): 20 Feet Assistive device: Rolling walker (2 wheeled) Gait Pattern/deviations: Step-to pattern;Decreased stride length;Trunk flexed;Shuffle Gait velocity: decreased  Gait velocity  interpretation: <1.31 ft/sec, indicative of household ambulator General Gait Details: cues for posture, direction and safety; patient reported left ankle pain    Stairs             Wheelchair Mobility    Modified Rankin (Stroke Patients Only)       Balance Overall balance assessment: Needs assistance;History of Falls Sitting-balance support: Feet supported Sitting balance-Leahy Scale: Fair Sitting balance - Comments: still leaning back    Standing balance support: Bilateral upper extremity supported Standing balance-Leahy Scale: Good                              Cognition Arousal/Alertness: Awake/alert Behavior During Therapy: WFL for tasks assessed/performed Overall Cognitive Status: History of cognitive impairments - at baseline                                 General Comments: Pt follows commands consistently, and is very appropriate with conversation       Exercises      General Comments General comments (skin integrity, edema, etc.): reviewed proper donning and doffing of the brace. She patients daughter reported the gow was also open the last time they had it on. She was shown how to prerly place the brace       Pertinent Vitals/Pain Pain Assessment: Faces Faces Pain Scale: Hurts a little bit Pain Location: Rt ankle  Pain Descriptors / Indicators: Aching Pain Intervention(s): Limited activity within patient's tolerance;Monitored during session;Repositioned    Home Living  Prior Function            PT Goals (current goals can now be found in the care plan section) Acute Rehab PT Goals Patient Stated Goal: to go home soon  PT Goal Formulation: With family Time For Goal Achievement: 05/13/19 Potential to Achieve Goals: Fair Progress towards PT goals: Progressing toward goals    Frequency    Min 4X/week      PT Plan Current plan remains appropriate    Co-evaluation PT/OT/SLP  Co-Evaluation/Treatment: Yes            AM-PAC PT "6 Clicks" Mobility   Outcome Measure  Help needed turning from your back to your side while in a flat bed without using bedrails?: A Lot Help needed moving from lying on your back to sitting on the side of a flat bed without using bedrails?: A Lot Help needed moving to and from a bed to a chair (including a wheelchair)?: A Lot Help needed standing up from a chair using your arms (e.g., wheelchair or bedside chair)?: Total Help needed to walk in hospital room?: A Little Help needed climbing 3-5 steps with a railing? : A Lot 6 Click Score: 12    End of Session         PT Visit Diagnosis: Other abnormalities of gait and mobility (R26.89);Difficulty in walking, not elsewhere classified (R26.2);History of falling (Z91.81);Unsteadiness on feet (R26.81)     Time: 1040(1040)-1103 PT Time Calculation (min) (ACUTE ONLY): 23 min  Charges:  $Gait Training: 8-22 mins $Self Care/Home Management: 8-22                       Carney Living PT DPT  04/30/2019, 1:06 PM

## 2019-04-30 NOTE — Progress Notes (Signed)
Discharge instructions reviewed with patient's daughter [name]. Patient reporting minimal pain. Patient discharged home via wheelchair to car. Massie Bougie, RN

## 2019-05-05 NOTE — Discharge Summary (Signed)
Patient ID: REILEIGH HABER [id number] 04-27-1934 85 y.o.  Admit date: 04/28/2019 Discharge date: 04/30/2019  Admitting Diagnosis: Fall down steps Left posterior 8-11 rib fxs  T2 endplate compression fracture  Multiple worsening and chronic compression fractures Dementia  H/O CVA Chronic A fib  CAD  HTN  COPD   Discharge Diagnosis Fall down steps Left posterior 8-11 rib fxs T2 endplate compression fracture Fx of the left transversae processes of L2 - L4 Multiple worsening and chronic compression fractures Right ankle pain  Dementia H/O CVA Chronic A fib CAD HTN COPD  Consultants Neurosurgery   Procedures None   Hospital Course: This is an 85 yo white female with a complex medical history including chronic A fib on coumadin, history of CVA with left-side hemiparesis on plavix, dementia, COPD, CKD, HTN, GERD, and CAD who lives at home with her husband.  Her daughter lives beside them.  This morning she put her brace on her left foot that she uses and got her walker and stood up.  The daughter then heard several loud thuds and realized she had fallen down her carpeted stairs into the basement.  She did hit her head, but the daughter states she was alert when she got to her.  The patient has pretty significant dementia and barely remembers that she even fell this morning.  She complained of some back and chest pain.  She apparently has had several falls last year as well with some old rib fractures and some compression fractures.  She has not been seen for her compression fractures.  Because the patient began to complain of pain in multiple places, the daughter decided to bring her to the hospital for evaluation.    Her her work up has revealed left-sided 8-11 posterior rib fractures along with what is suspected to be an acute T2 compression endplate fracture along with multiple other worsening and chronic compression fractures in her spine.  CT of her head was  negative.  We have been asked to see her for admission for pain control and evaluation.  Patient was admitted to the trauma service. Neurosurgery was consulted. They recommended TLSO bracing when upright and oob. They obtained CT L-Spine that showed minimally displaced fx of the left transversae processes of L2 - L4. PT/OT was consulted and recommended [facility] w/ 24/7 supervision. Daughter states she and her sister will provide 24h care at discharge when first discharged.   On 3/20, the patient was voiding well, tolerating diet, working well with therapies, pain well controlled, vital signs stable, and felt stable for discharge home with below follow up.  I was not directly involved in this patient's care and did not see the patient during their hospital stay, therefore the information in this discharge summary was taken entirely from the chart.  Physical Exam: Please see MD's note from earlier today   Allergies as of 04/30/2019      Reactions   Ezetimibe Other (See Comments)   Myalgia   Welchol [colesevelam Hcl] Other (See Comments)   Muscle aches   Adhesive [tape] Itching, Rash, Other (See Comments)   Burning    Ceclor [cefaclor] Other (See Comments)   Unknown allergic reaction   Elastic Bandages & [zinc] Rash, Other (See Comments)   Turns red on the areas it touches   Latex Itching, Rash, Other (See Comments)   Burning    Lipitor [atorvastatin Calcium] Other (See Comments)   Increased fibromyalgia pain   Mevacor [lovastatin] Other (See Comments)  Increased fibromyalgia pain   Pravachol Other (See Comments)   Increased fibromyalgia pain   Vasotec Other (See Comments)   Unknown allergic reaction      Medication List    TAKE these medications   allopurinol 300 MG tablet Commonly known as: ZYLOPRIM Take 300 mg by mouth at bedtime.   amLODipine 5 MG tablet Commonly known as: NORVASC TAKE 1 TABLET BY MOUTH EVERYDAY AT BEDTIME What changed:   how much to take  how to take  this  when to take this  additional instructions   celecoxib 200 MG capsule Commonly known as: CELEBREX Take 200 mg by mouth every morning.   citalopram 20 MG tablet Commonly known as: CELEXA TAKE 1 TABLET BY MOUTH TWICE A DAY   clopidogrel 75 MG tablet Commonly known as: PLAVIX TAKE 1 TABLET (75 MG TOTAL) BY MOUTH DAILY.   Coumadin 5 MG tablet Generic drug: warfarin Take as directed. If you are unsure how to take this medication, talk to your nurse or doctor. Original instructions: TAKE 1 TO 1 &1/2 TABLETS DAILY AS DIRECTED What changed:   how much to take  how to take this  when to take this  additional instructions   diphenoxylate-atropine 2.5-0.025 MG tablet Commonly known as: LOMOTIL Take 1 tablet by mouth daily as needed for diarrhea or loose stools.   donepezil 5 MG tablet Commonly known as: ARICEPT Take 1 tablet (5 mg total) by mouth daily.   ergocalciferol 1.25 MG (50000 UT) capsule Commonly known as: VITAMIN D2 Take 50,000 Units by mouth 2 (two) times a week. Tuesday and Friday   Flutter Devi Use as directed.   furosemide 80 MG tablet Commonly known as: LASIX Take 80 mg by mouth daily.   levothyroxine 25 MCG tablet Commonly known as: SYNTHROID Take 25 mcg by mouth every morning.   losartan 25 MG tablet Commonly known as: COZAAR TAKE 1 TABLET BY MOUTH EVERY DAY   memantine 10 MG tablet Commonly known as: Namenda Take 1 tablet (10 mg total) by mouth 2 (two) times daily.   metoprolol succinate 25 MG 24 hr tablet Commonly known as: TOPROL-XL TAKE 1 TABLET BY MOUTH EVERY DAY What changed: when to take this   nitroGLYCERIN 0.4 MG SL tablet Commonly known as: NITROSTAT Place 1 tablet (0.4 mg total) under the tongue every 5 (five) minutes as needed for chest pain.   potassium chloride SA 20 MEQ tablet Commonly known as: Klor-Con M20 TAKE 1 TABLET EVERY OTHER DAY, ALTERNATING WITH 2 TABLETS EVERY OTHER DAY What changed:   how much to  take  how to take this  when to take this  additional instructions   ProAir HFA 108 (90 Base) MCG/ACT inhaler Generic drug: albuterol INHALE 2 PUFFS INTO LUNGS EVERY 6 HOURS AS NEEDED FOR WHEEZING What changed: See the new instructions.   traMADol 50 MG tablet Commonly known as: ULTRAM Take 50 mg by mouth every 6 (six) hours as needed for pain. What changed: Another medication with the same name was added. Make sure you understand how and when to take each.   traMADol 50 MG tablet Commonly known as: ULTRAM Take 1 tablet (50 mg total) by mouth every 6 (six) hours as needed for moderate pain. What changed: You were already taking a medication with the same name, and this prescription was added. Make sure you understand how and when to take each.   ZINC-VITAMIN C PO Take 1 tablet by mouth every evening.  Follow-up Information    Antony Contras, MD Follow up.   Specialty: Family Medicine Why: as needed for rib fractures Contact information: [place], [place] [address] [phone]        Luanne Bras, MD Follow up.   Specialties: Interventional Radiology, Radiology Why: as needed for back pain and compression fractures Contact information: [place] [address] [phone]        Orthopedist Follow up.   Why: as needed for right ankle pain       [place]. Call.   Why: As needed  Contact information: [address] [phone]          Signed: Alferd Apa, [facility] 05/05/2019, 10:19 AM Please see Amion for pager number during day hours 7:00am-4:30pm

## 2019-05-13 ENCOUNTER — Other Ambulatory Visit: Payer: Self-pay | Admitting: Interventional Cardiology

## 2019-05-18 ENCOUNTER — Encounter: Payer: Self-pay | Admitting: Podiatry

## 2019-05-18 ENCOUNTER — Other Ambulatory Visit: Payer: Self-pay

## 2019-05-18 ENCOUNTER — Ambulatory Visit (INDEPENDENT_AMBULATORY_CARE_PROVIDER_SITE_OTHER): Payer: Medicare PPO | Admitting: Podiatry

## 2019-05-18 DIAGNOSIS — L84 Corns and callosities: Secondary | ICD-10-CM | POA: Diagnosis not present

## 2019-05-18 DIAGNOSIS — S93401D Sprain of unspecified ligament of right ankle, subsequent encounter: Secondary | ICD-10-CM | POA: Diagnosis not present

## 2019-05-18 DIAGNOSIS — S93409A Sprain of unspecified ligament of unspecified ankle, initial encounter: Secondary | ICD-10-CM | POA: Insufficient documentation

## 2019-05-19 NOTE — Progress Notes (Signed)
This patient presents the office for evaluation of both of her feet.  She says that 4 weeks ago she suffered an ankle sprain in her right ankle for which she had x-rays taken.  She says that her ankle still painful since she injured her foot/ankle.  She says she has difficulty walking due to the pain in her right ankle.  This patient also wears a brace on her left foot/leg.  This is caused the callus to develop on the outside of her left foot.  She says she is not having any pain or discomfort at this callus today.  She presents the office today with her daughter for an evaluation of these 2 conditions.  Patient is taking plavix.  Patient has history of CVA which has led to deformity of left foot.   Vascular  Dorsalis pedis and posterior tibial pulses are not  palpable  B/L.  Capillary return  WNL.  Cold feet.  .  Skin turgor  WNL  Sensorium  deferred  Nail Exam  Patient has normal nails with no evidence of bacterial or fungal infection.  Orthopedic  Exam  Muscle tone and muscle strength  WNL.  No limitations of motion feet  B/L.  No crepitus or joint effusion noted.  Foot type is unremarkable and digits show no abnormalities.  Bony prominence at base of fifth metabase left foot.  Inversion/HTA left forefoot.  Skin  No open lesions.  Normal skin texture and turgor. Callus on  lateral aspect fifth metabase left foot.  Ankle sprain right foot.  Callus lateral aspect fifth metabase left foot.  IE.  Discussed these 2 conditions with this patient and daughter.  Told her that clinically there is no evidence of any sprain in her right ankle.  Her [facility] that were taken at [facility] were examined prior to entering the room.  There does not appear to be any remnant inflammation noted to her right ankle today.  She has developed a callus on the lateral aspect of the fifth meta base due to her brace.  Patient states that there is no pain or discomfort and she requests that the skin lesion not to be touched  today.  Return to clinic as needed

## 2019-05-23 ENCOUNTER — Ambulatory Visit (INDEPENDENT_AMBULATORY_CARE_PROVIDER_SITE_OTHER): Payer: Medicare PPO | Admitting: *Deleted

## 2019-05-23 ENCOUNTER — Other Ambulatory Visit: Payer: Self-pay

## 2019-05-23 DIAGNOSIS — Z5181 Encounter for therapeutic drug level monitoring: Secondary | ICD-10-CM

## 2019-05-23 DIAGNOSIS — I4891 Unspecified atrial fibrillation: Secondary | ICD-10-CM | POA: Diagnosis not present

## 2019-05-23 LAB — POCT INR: INR: 1.8 — AB (ref 2.0–3.0)

## 2019-05-23 NOTE — Patient Instructions (Signed)
Description   Take 1.5 tablets today and then continue on same dosage 1 tablet daily. Recheck INR in 4 weeks. Call with any questions or problems [phone] to [facility], Main # [phone].

## 2019-06-03 IMAGING — RF DG SWALLOWING FUNCTION - NRPT [facility]
16 series · 24 of 24 positions shown · non-contrast
Comparison: none

[Series 1: cp_standard · 0.35mm/px · 2 of 63 frames shown (1 of 16)]
[frame 10/63]
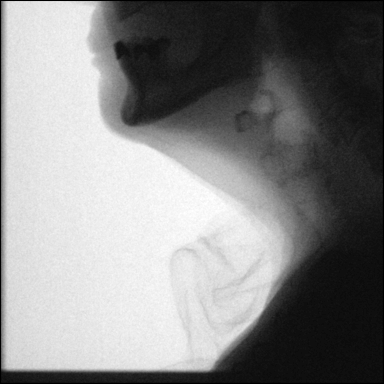
[frame 54/63]
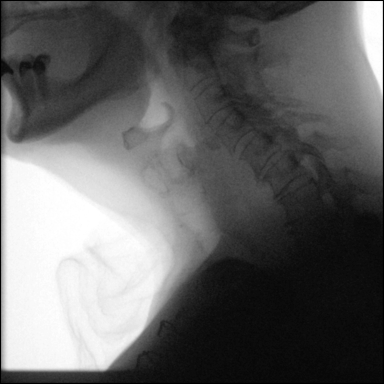

[Series 2: cp_standard · 0.35mm/px · 1 of 39 frames shown (2 of 16)]
[frame 20/39]
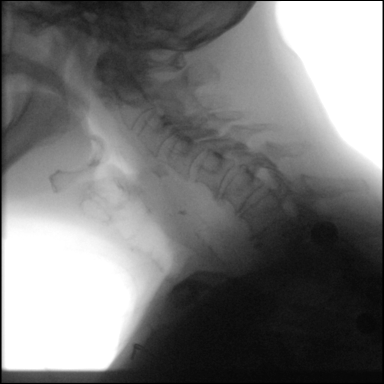

[Series 3: cp_standard · 0.35mm/px · 2 of 64 frames shown (3 of 16)]
[frame 10/64]
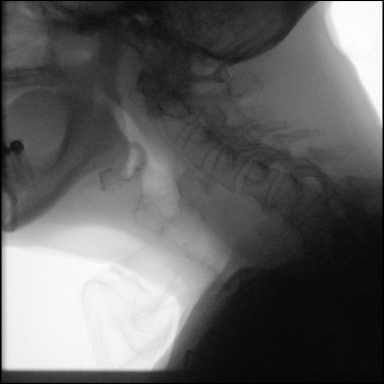
[frame 55/64]
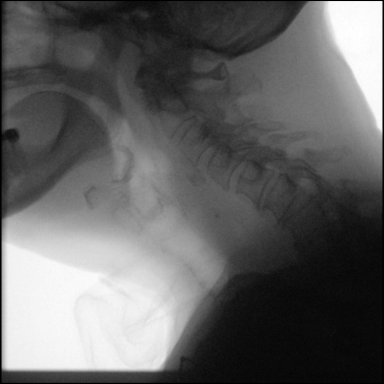

[Series 4: cp_standard · 0.35mm/px · 1 of 76 frames shown (4 of 16)]
[frame 39/76]
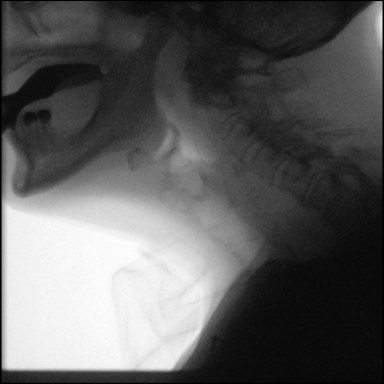

[Series 5: cp_standard · 0.35mm/px · 2 of 30 frames shown (5 of 16)]
[frame 5/30]
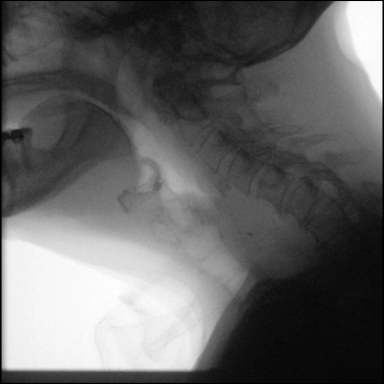
[frame 26/30]
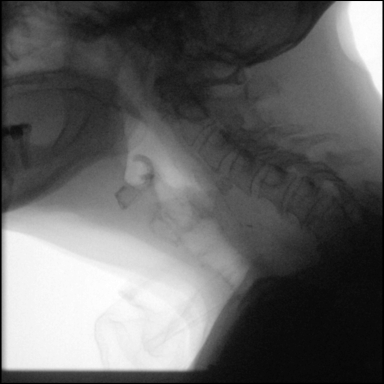

[Series 6: cp_standard · 0.34mm/px · 1 of 114 frames shown (6 of 16)]
[frame 58/114]
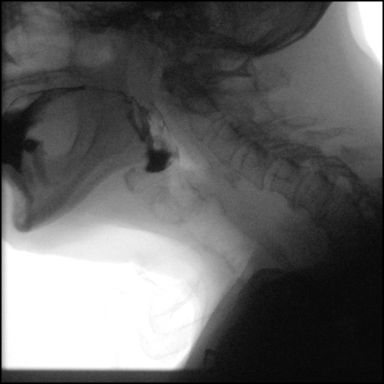

[Series 7: cp_standard · 0.34mm/px · 2 of 56 frames shown (7 of 16)]
[frame 9/56]
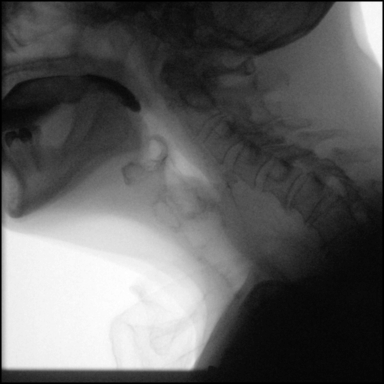
[frame 48/56]
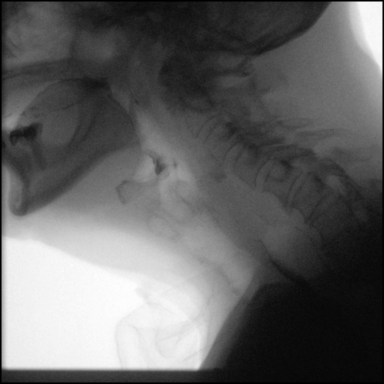

[Series 8: cp_standard · 0.34mm/px · 1 of 145 frames shown (8 of 16)]
[frame 73/145]
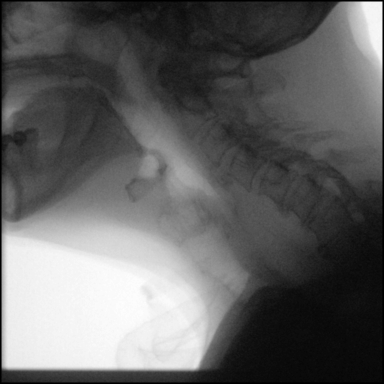

[Series 9: cp_standard · 0.34mm/px · 1 of 90 frames shown (9 of 16)]
[frame 46/90]
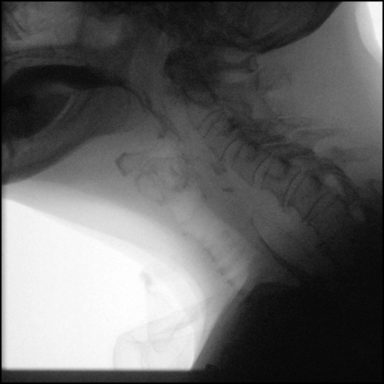

[Series 10: cp_standard · 0.34mm/px · 2 of 15 frames shown (10 of 16)]
[frame 1/15]
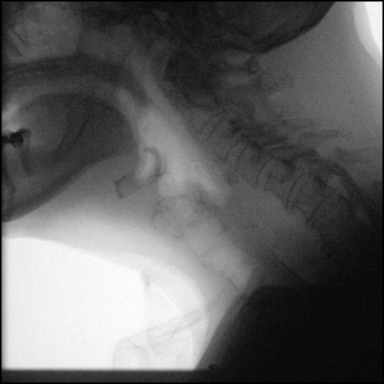
[frame 8/15]
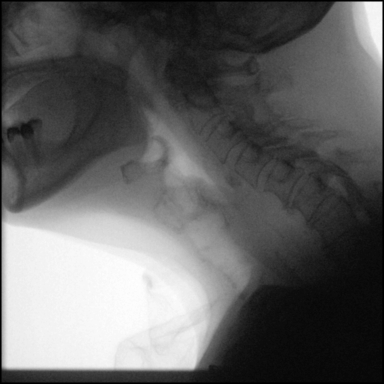

[Series 11: cp_standard · 0.34mm/px · 1 of 59 frames shown (11 of 16)]
[frame 30/59]
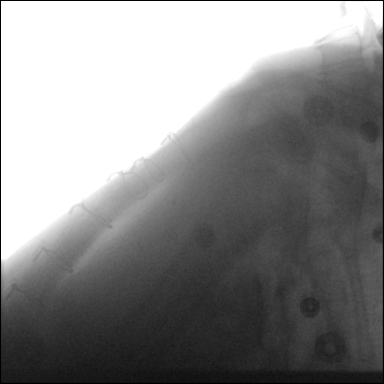

[Series 12: cp_standard · 0.34mm/px · 2 of 122 frames shown (12 of 16)]
[frame 1/122]
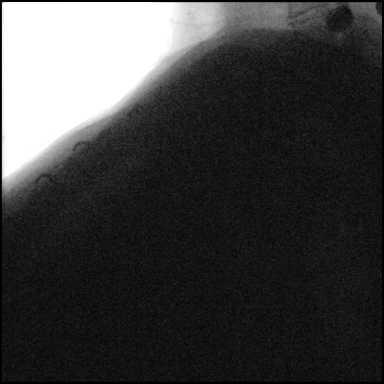
[frame 104/122]
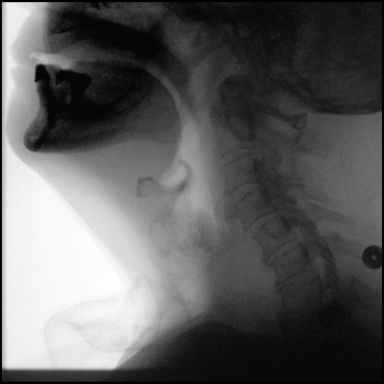

[Series 13: cp_standard · 0.34mm/px · 1 of 128 frames shown (13 of 16)]
[frame 65/128]
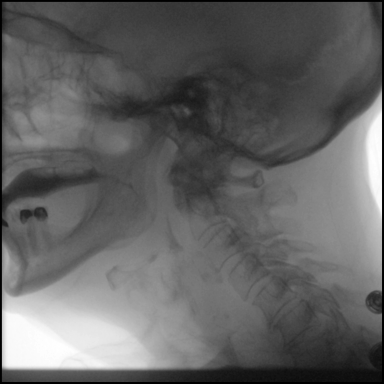

[Series 14: cp_standard · 0.34mm/px · 2 of 80 frames shown (14 of 16)]
[frame 13/80]
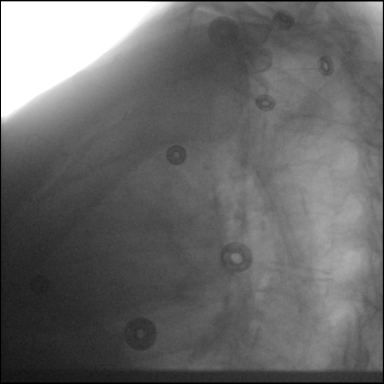
[frame 69/80]
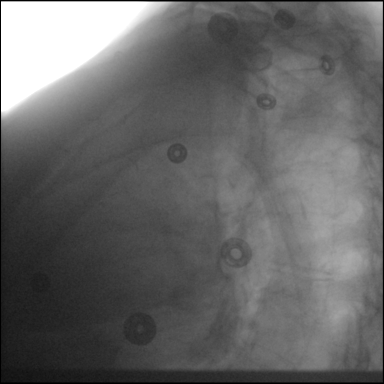

[Series 15: cp_standard · 0.34mm/px · 1 of 243 frames shown (15 of 16)]
[frame 122/243]
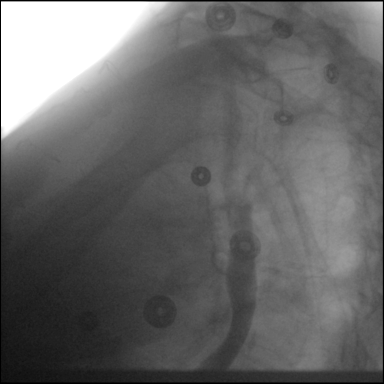

[Series 16: cp_standard · 0.34mm/px · 2 of 26 frames shown (16 of 16)]
[frame 4/26]
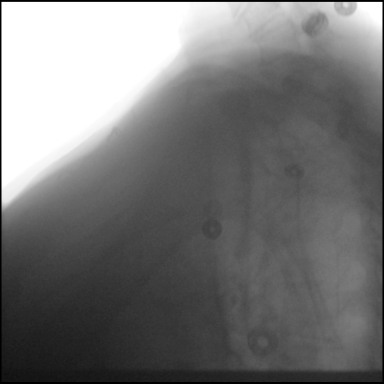
[frame 23/26]
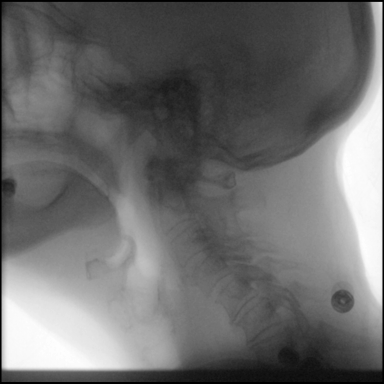

[24 of 24 positions shown; findings below may reference images not displayed]

FLUOROSCOPY FOR SWALLOWING FUNCTION STUDY:
Fluoroscopy was provided for swallowing function study, which was administered by a speech pathologist.  Final results and recommendations from this study are contained within the speech pathology report.

## 2019-06-21 ENCOUNTER — Other Ambulatory Visit: Payer: Self-pay

## 2019-06-21 ENCOUNTER — Ambulatory Visit (INDEPENDENT_AMBULATORY_CARE_PROVIDER_SITE_OTHER): Payer: Medicare PPO | Admitting: *Deleted

## 2019-06-21 DIAGNOSIS — Z5181 Encounter for therapeutic drug level monitoring: Secondary | ICD-10-CM

## 2019-06-21 DIAGNOSIS — I4891 Unspecified atrial fibrillation: Secondary | ICD-10-CM | POA: Diagnosis not present

## 2019-06-21 LAB — POCT INR: INR: 2.3 (ref 2.0–3.0)

## 2019-06-21 NOTE — Patient Instructions (Signed)
Description   Continue on same dosage 1 tablet daily. Recheck INR in 5 weeks. Call with any questions or problems [phone] to [facility], Main # [phone].

## 2019-07-22 DIAGNOSIS — R0981 Nasal congestion: Secondary | ICD-10-CM | POA: Diagnosis not present

## 2019-07-22 DIAGNOSIS — J209 Acute bronchitis, unspecified: Secondary | ICD-10-CM | POA: Diagnosis not present

## 2019-07-22 DIAGNOSIS — Z03818 Encounter for observation for suspected exposure to other biological agents ruled out: Secondary | ICD-10-CM | POA: Diagnosis not present

## 2019-07-22 DIAGNOSIS — R05 Cough: Secondary | ICD-10-CM | POA: Diagnosis not present

## 2019-07-23 ENCOUNTER — Other Ambulatory Visit: Payer: Self-pay

## 2019-07-23 ENCOUNTER — Inpatient Hospital Stay (HOSPITAL_COMMUNITY)
Admission: EM | Admit: 2019-07-23 | Discharge: 2019-08-04 | DRG: 177 | Disposition: A | Discharge status: Home Health | Payer: Medicare PPO | Attending: Family Medicine | Admitting: Family Medicine

## 2019-07-23 ENCOUNTER — Emergency Department (HOSPITAL_COMMUNITY): Payer: Medicare PPO

## 2019-07-23 ENCOUNTER — Encounter (HOSPITAL_COMMUNITY): Payer: Self-pay

## 2019-07-23 DIAGNOSIS — F0390 Unspecified dementia without behavioral disturbance: Secondary | ICD-10-CM | POA: Diagnosis present

## 2019-07-23 DIAGNOSIS — I2510 Atherosclerotic heart disease of native coronary artery without angina pectoris: Secondary | ICD-10-CM | POA: Diagnosis present

## 2019-07-23 DIAGNOSIS — J441 Chronic obstructive pulmonary disease with (acute) exacerbation: Secondary | ICD-10-CM | POA: Diagnosis not present

## 2019-07-23 DIAGNOSIS — M1990 Unspecified osteoarthritis, unspecified site: Secondary | ICD-10-CM | POA: Diagnosis present

## 2019-07-23 DIAGNOSIS — B348 Other viral infections of unspecified site: Secondary | ICD-10-CM | POA: Diagnosis not present

## 2019-07-23 DIAGNOSIS — Z79890 Hormone replacement therapy (postmenopausal): Secondary | ICD-10-CM

## 2019-07-23 DIAGNOSIS — Z87440 Personal history of urinary (tract) infections: Secondary | ICD-10-CM

## 2019-07-23 DIAGNOSIS — K219 Gastro-esophageal reflux disease without esophagitis: Secondary | ICD-10-CM | POA: Diagnosis present

## 2019-07-23 DIAGNOSIS — J069 Acute upper respiratory infection, unspecified: Secondary | ICD-10-CM | POA: Diagnosis present

## 2019-07-23 DIAGNOSIS — I130 Hypertensive heart and chronic kidney disease with heart failure and stage 1 through stage 4 chronic kidney disease, or unspecified chronic kidney disease: Secondary | ICD-10-CM | POA: Diagnosis present

## 2019-07-23 DIAGNOSIS — J439 Emphysema, unspecified: Secondary | ICD-10-CM | POA: Diagnosis present

## 2019-07-23 DIAGNOSIS — E039 Hypothyroidism, unspecified: Secondary | ICD-10-CM | POA: Diagnosis present

## 2019-07-23 DIAGNOSIS — Z20822 Contact with and (suspected) exposure to covid-19: Secondary | ICD-10-CM | POA: Diagnosis present

## 2019-07-23 DIAGNOSIS — M797 Fibromyalgia: Secondary | ICD-10-CM | POA: Diagnosis present

## 2019-07-23 DIAGNOSIS — Z85820 Personal history of malignant melanoma of skin: Secondary | ICD-10-CM

## 2019-07-23 DIAGNOSIS — R05 Cough: Secondary | ICD-10-CM | POA: Diagnosis not present

## 2019-07-23 DIAGNOSIS — M810 Age-related osteoporosis without current pathological fracture: Secondary | ICD-10-CM | POA: Diagnosis present

## 2019-07-23 DIAGNOSIS — R0902 Hypoxemia: Secondary | ICD-10-CM | POA: Diagnosis present

## 2019-07-23 DIAGNOSIS — I5033 Acute on chronic diastolic (congestive) heart failure: Secondary | ICD-10-CM

## 2019-07-23 DIAGNOSIS — J189 Pneumonia, unspecified organism: Secondary | ICD-10-CM

## 2019-07-23 DIAGNOSIS — F329 Major depressive disorder, single episode, unspecified: Secondary | ICD-10-CM | POA: Diagnosis present

## 2019-07-23 DIAGNOSIS — I482 Chronic atrial fibrillation, unspecified: Secondary | ICD-10-CM | POA: Diagnosis present

## 2019-07-23 DIAGNOSIS — J939 Pneumothorax, unspecified: Secondary | ICD-10-CM | POA: Diagnosis not present

## 2019-07-23 DIAGNOSIS — J209 Acute bronchitis, unspecified: Secondary | ICD-10-CM | POA: Diagnosis present

## 2019-07-23 DIAGNOSIS — J90 Pleural effusion, not elsewhere classified: Secondary | ICD-10-CM | POA: Diagnosis not present

## 2019-07-23 DIAGNOSIS — M109 Gout, unspecified: Secondary | ICD-10-CM | POA: Diagnosis present

## 2019-07-23 DIAGNOSIS — J449 Chronic obstructive pulmonary disease, unspecified: Secondary | ICD-10-CM | POA: Diagnosis present

## 2019-07-23 DIAGNOSIS — Z66 Do not resuscitate: Secondary | ICD-10-CM | POA: Diagnosis not present

## 2019-07-23 DIAGNOSIS — E876 Hypokalemia: Secondary | ICD-10-CM | POA: Diagnosis present

## 2019-07-23 DIAGNOSIS — J40 Bronchitis, not specified as acute or chronic: Secondary | ICD-10-CM

## 2019-07-23 DIAGNOSIS — T17890A Other foreign object in other parts of respiratory tract causing asphyxiation, initial encounter: Secondary | ICD-10-CM | POA: Diagnosis not present

## 2019-07-23 DIAGNOSIS — R059 Cough, unspecified: Secondary | ICD-10-CM

## 2019-07-23 DIAGNOSIS — J984 Other disorders of lung: Secondary | ICD-10-CM | POA: Diagnosis not present

## 2019-07-23 DIAGNOSIS — J4489 Other specified chronic obstructive pulmonary disease: Secondary | ICD-10-CM | POA: Diagnosis present

## 2019-07-23 DIAGNOSIS — E785 Hyperlipidemia, unspecified: Secondary | ICD-10-CM | POA: Diagnosis present

## 2019-07-23 DIAGNOSIS — Z8249 Family history of ischemic heart disease and other diseases of the circulatory system: Secondary | ICD-10-CM

## 2019-07-23 DIAGNOSIS — Z951 Presence of aortocoronary bypass graft: Secondary | ICD-10-CM

## 2019-07-23 DIAGNOSIS — R0602 Shortness of breath: Secondary | ICD-10-CM

## 2019-07-23 DIAGNOSIS — Z888 Allergy status to other drugs, medicaments and biological substances status: Secondary | ICD-10-CM

## 2019-07-23 DIAGNOSIS — Z833 Family history of diabetes mellitus: Secondary | ICD-10-CM

## 2019-07-23 DIAGNOSIS — Z87891 Personal history of nicotine dependence: Secondary | ICD-10-CM

## 2019-07-23 DIAGNOSIS — I517 Cardiomegaly: Secondary | ICD-10-CM | POA: Diagnosis not present

## 2019-07-23 DIAGNOSIS — Z91040 Latex allergy status: Secondary | ICD-10-CM

## 2019-07-23 DIAGNOSIS — N1831 Chronic kidney disease, stage 3a: Secondary | ICD-10-CM | POA: Diagnosis present

## 2019-07-23 DIAGNOSIS — I4891 Unspecified atrial fibrillation: Secondary | ICD-10-CM | POA: Diagnosis not present

## 2019-07-23 DIAGNOSIS — Z79899 Other long term (current) drug therapy: Secondary | ICD-10-CM

## 2019-07-23 DIAGNOSIS — J9601 Acute respiratory failure with hypoxia: Secondary | ICD-10-CM | POA: Diagnosis not present

## 2019-07-23 DIAGNOSIS — Z7902 Long term (current) use of antithrombotics/antiplatelets: Secondary | ICD-10-CM

## 2019-07-23 DIAGNOSIS — T502X5A Adverse effect of carbonic-anhydrase inhibitors, benzothiadiazides and other diuretics, initial encounter: Secondary | ICD-10-CM | POA: Diagnosis present

## 2019-07-23 DIAGNOSIS — J690 Pneumonitis due to inhalation of food and vomit: Secondary | ICD-10-CM | POA: Diagnosis not present

## 2019-07-23 DIAGNOSIS — I69354 Hemiplegia and hemiparesis following cerebral infarction affecting left non-dominant side: Secondary | ICD-10-CM | POA: Diagnosis not present

## 2019-07-23 DIAGNOSIS — Z823 Family history of stroke: Secondary | ICD-10-CM

## 2019-07-23 DIAGNOSIS — Z7901 Long term (current) use of anticoagulants: Secondary | ICD-10-CM

## 2019-07-23 DIAGNOSIS — J9811 Atelectasis: Secondary | ICD-10-CM | POA: Diagnosis not present

## 2019-07-23 DIAGNOSIS — R21 Rash and other nonspecific skin eruption: Secondary | ICD-10-CM | POA: Diagnosis not present

## 2019-07-23 DIAGNOSIS — R4182 Altered mental status, unspecified: Secondary | ICD-10-CM | POA: Diagnosis not present

## 2019-07-23 DIAGNOSIS — Z85828 Personal history of other malignant neoplasm of skin: Secondary | ICD-10-CM

## 2019-07-23 DIAGNOSIS — B9789 Other viral agents as the cause of diseases classified elsewhere: Secondary | ICD-10-CM | POA: Diagnosis present

## 2019-07-23 DIAGNOSIS — J9690 Respiratory failure, unspecified, unspecified whether with hypoxia or hypercapnia: Secondary | ICD-10-CM | POA: Diagnosis not present

## 2019-07-23 DIAGNOSIS — Z91048 Other nonmedicinal substance allergy status: Secondary | ICD-10-CM

## 2019-07-23 DIAGNOSIS — R1310 Dysphagia, unspecified: Secondary | ICD-10-CM | POA: Diagnosis present

## 2019-07-23 LAB — URINALYSIS, ROUTINE W REFLEX MICROSCOPIC
Bilirubin Urine: NEGATIVE
Glucose, UA: NEGATIVE mg/dL
Hgb urine dipstick: NEGATIVE
Ketones, ur: NEGATIVE mg/dL
Nitrite: NEGATIVE
Protein, ur: NEGATIVE mg/dL
Specific Gravity, Urine: 1.014 (ref 1.005–1.030)
pH: 6 (ref 5.0–8.0)

## 2019-07-23 LAB — CBC WITH DIFFERENTIAL/PLATELET
Abs Immature Granulocytes: 0.02 10*3/uL (ref 0.00–0.07)
Basophils Absolute: 0.1 10*3/uL (ref 0.0–0.1)
Basophils Relative: 1 %
Eosinophils Absolute: 0.2 10*3/uL (ref 0.0–0.5)
Eosinophils Relative: 2 %
HCT: 42.5 % (ref 36.0–46.0)
Hemoglobin: 13.4 g/dL (ref 12.0–15.0)
Immature Granulocytes: 0 %
Lymphocytes Relative: 17 %
Lymphs Abs: 1.5 10*3/uL (ref 0.7–4.0)
MCH: 29.8 pg (ref 26.0–34.0)
MCHC: 31.5 g/dL (ref 30.0–36.0)
MCV: 94.4 fL (ref 80.0–100.0)
Monocytes Absolute: 0.9 10*3/uL (ref 0.1–1.0)
Monocytes Relative: 11 %
Neutro Abs: 5.9 10*3/uL (ref 1.7–7.7)
Neutrophils Relative %: 69 %
Platelets: 176 10*3/uL (ref 150–400)
RBC: 4.5 MIL/uL (ref 3.87–5.11)
RDW: 15.5 % (ref 11.5–15.5)
WBC: 8.5 10*3/uL (ref 4.0–10.5)
nRBC: 0 % (ref 0.0–0.2)

## 2019-07-23 LAB — COMPREHENSIVE METABOLIC PANEL
ALT: 24 U/L (ref 0–44)
AST: 60 U/L — ABNORMAL HIGH (ref 15–41)
Albumin: 4.7 g/dL (ref 3.5–5.0)
Alkaline Phosphatase: 64 U/L (ref 38–126)
Anion gap: 16 — ABNORMAL HIGH (ref 5–15)
BUN: 15 mg/dL (ref 8–23)
CO2: 31 mmol/L (ref 22–32)
Calcium: 9.5 mg/dL (ref 8.9–10.3)
Chloride: 98 mmol/L (ref 98–111)
Creatinine, Ser: 0.85 mg/dL (ref 0.44–1.00)
GFR calc Af Amer: >60 mL/min (ref >60)
GFR calc non Af Amer: >60 mL/min (ref >60)
Glucose, Bld: 96 mg/dL (ref 70–99)
Potassium: 3.4 mmol/L — ABNORMAL LOW (ref 3.5–5.1)
Sodium: 145 mmol/L (ref 135–145)
Total Bilirubin: 1.2 mg/dL (ref 0.3–1.2)
Total Protein: 7.6 g/dL (ref 6.5–8.1)

## 2019-07-23 LAB — PROTIME-INR
INR: 2.2 — ABNORMAL HIGH (ref 0.8–1.2)
Prothrombin Time: 23.7 seconds — ABNORMAL HIGH (ref 11.4–15.2)

## 2019-07-23 LAB — BRAIN NATRIURETIC PEPTIDE: B Natriuretic Peptide: 513.1 pg/mL — ABNORMAL HIGH (ref 0.0–100.0)

## 2019-07-23 MED ORDER — MAGNESIUM HYDROXIDE 400 MG/5ML PO SUSP: 30.0000 mL | Freq: Every day | ORAL | Status: DC | PRN

## 2019-07-23 MED ORDER — SODIUM CHLORIDE (PF) 0.9 % IJ SOLN
INTRAMUSCULAR | Status: AC
  Filled 2019-07-23: qty 50

## 2019-07-23 MED ORDER — ACETAMINOPHEN 325 MG PO TABS: 650.0000 mg | ORAL_TABLET | Freq: Four times a day (QID) | ORAL | Status: DC | PRN

## 2019-07-23 MED ORDER — SODIUM CHLORIDE 0.9 % IV SOLN: INTRAVENOUS | Status: DC

## 2019-07-23 MED ORDER — POTASSIUM CHLORIDE CRYS ER 20 MEQ PO TBCR: 20.0000 meq | EXTENDED_RELEASE_TABLET | Freq: Every morning | ORAL | Status: DC

## 2019-07-23 MED ORDER — FLUTICASONE PROPIONATE 50 MCG/ACT NA SUSP
1.0000 | Freq: Every day | NASAL | Status: DC
  Filled 2019-07-23: qty 16

## 2019-07-23 MED ORDER — NITROGLYCERIN 0.4 MG SL SUBL: 0.4000 mg | SUBLINGUAL_TABLET | SUBLINGUAL | Status: DC | PRN

## 2019-07-23 MED ORDER — CITALOPRAM HYDROBROMIDE 20 MG PO TABS
20.0000 mg | ORAL_TABLET | Freq: Two times a day (BID) | ORAL | Status: DC
  Administered 2019-07-25 – 2019-08-04 (×21): 20 mg via ORAL
  Filled 2019-07-23 (×12): qty 1
  Filled 2019-07-23: qty 2
  Filled 2019-07-23 (×6): qty 1
  Filled 2019-07-23: qty 2
  Filled 2019-07-23 (×2): qty 1

## 2019-07-23 MED ORDER — DIPHENOXYLATE-ATROPINE 2.5-0.025 MG PO TABS: 1.0000 | ORAL_TABLET | ORAL | Status: DC

## 2019-07-23 MED ORDER — ENOXAPARIN SODIUM 40 MG/0.4ML ~~LOC~~ SOLN: 40.0000 mg | SUBCUTANEOUS | Status: DC

## 2019-07-23 MED ORDER — ONDANSETRON HCL 4 MG PO TABS: 4.0000 mg | ORAL_TABLET | Freq: Four times a day (QID) | ORAL | Status: DC | PRN

## 2019-07-23 MED ORDER — CELECOXIB 200 MG PO CAPS: 200.0000 mg | ORAL_CAPSULE | Freq: Every day | ORAL | Status: DC

## 2019-07-23 MED ORDER — IOHEXOL 350 MG/ML SOLN
100.0000 mL | Freq: Once | INTRAVENOUS | Status: AC | PRN
  Administered 2019-07-23: 100 mL via INTRAVENOUS

## 2019-07-23 MED ORDER — LEVOTHYROXINE SODIUM 25 MCG PO TABS: 25.0000 ug | ORAL_TABLET | ORAL | Status: DC

## 2019-07-23 MED ORDER — ONDANSETRON HCL 4 MG/2ML IJ SOLN
4.0000 mg | Freq: Four times a day (QID) | INTRAMUSCULAR | Status: DC | PRN
  Administered 2019-07-27: 4 mg via INTRAVENOUS
  Filled 2019-07-23: qty 2

## 2019-07-23 MED ORDER — POTASSIUM CHLORIDE IN NACL 20-0.9 MEQ/L-% IV SOLN
INTRAVENOUS | Status: DC
  Filled 2019-07-23: qty 1000

## 2019-07-23 MED ORDER — MEMANTINE HCL 10 MG PO TABS: 10.0000 mg | ORAL_TABLET | Freq: Two times a day (BID) | ORAL | Status: DC

## 2019-07-23 MED ORDER — VITAMIN D (ERGOCALCIFEROL) 1.25 MG (50000 UNIT) PO CAPS: 50000.0000 [IU] | ORAL_CAPSULE | ORAL | Status: DC

## 2019-07-23 MED ORDER — DONEPEZIL HCL 5 MG PO TABS: 5.0000 mg | ORAL_TABLET | Freq: Every day | ORAL | Status: DC

## 2019-07-23 MED ORDER — METOPROLOL SUCCINATE ER 25 MG PO TB24
25.0000 mg | ORAL_TABLET | Freq: Every morning | ORAL | Status: DC
  Administered 2019-07-24 – 2019-08-04 (×12): 25 mg via ORAL
  Filled 2019-07-23 (×12): qty 1

## 2019-07-23 MED ORDER — ALLOPURINOL 300 MG PO TABS: 300.0000 mg | ORAL_TABLET | Freq: Every day | ORAL | Status: DC

## 2019-07-23 MED ORDER — ACETAMINOPHEN 650 MG RE SUPP: 650.0000 mg | Freq: Four times a day (QID) | RECTAL | Status: DC | PRN

## 2019-07-23 MED ORDER — LOSARTAN POTASSIUM 50 MG PO TABS
25.0000 mg | ORAL_TABLET | Freq: Every day | ORAL | Status: DC
  Administered 2019-07-24 – 2019-08-04 (×12): 25 mg via ORAL
  Filled 2019-07-23 (×12): qty 1

## 2019-07-23 MED ORDER — AMLODIPINE BESYLATE 5 MG PO TABS
5.0000 mg | ORAL_TABLET | Freq: Every day | ORAL | Status: DC
  Administered 2019-07-24 – 2019-07-28 (×5): 5 mg via ORAL
  Filled 2019-07-23 (×6): qty 1

## 2019-07-23 MED ORDER — TRAZODONE HCL 50 MG PO TABS: 25.0000 mg | ORAL_TABLET | Freq: Every evening | ORAL | Status: DC | PRN

## 2019-07-23 NOTE — ED Triage Notes (Signed)
Patient c/o a productive cough with green sputum, SOB, fatigue, weakness, and SOB x 4 days. Patient was seen at [facility] yesterday. patient's daughter took the patient back today because symptoms were worse. Patient was prescribed antibiotics yesterday, had a Covid test, but no results.

## 2019-07-23 NOTE — ED Notes (Signed)
Patient placed on 2 liters nasal canula O2 sat 87%.

## 2019-07-24 DIAGNOSIS — R0902 Hypoxemia: Secondary | ICD-10-CM

## 2019-07-24 LAB — CBC
HCT: 40.4 % (ref 36.0–46.0)
Hemoglobin: 12.8 g/dL (ref 12.0–15.0)
MCH: 29.8 pg (ref 26.0–34.0)
MCHC: 31.7 g/dL (ref 30.0–36.0)
MCV: 94.2 fL (ref 80.0–100.0)
Platelets: 174 10*3/uL (ref 150–400)
RBC: 4.29 MIL/uL (ref 3.87–5.11)
RDW: 15.4 % (ref 11.5–15.5)
WBC: 9.8 10*3/uL (ref 4.0–10.5)
nRBC: 0 % (ref 0.0–0.2)

## 2019-07-24 LAB — EXPECTORATED SPUTUM ASSESSMENT W GRAM STAIN, RFLX TO RESP C

## 2019-07-24 LAB — PROTIME-INR
INR: 2.2 — ABNORMAL HIGH (ref 0.8–1.2)
Prothrombin Time: 23.6 seconds — ABNORMAL HIGH (ref 11.4–15.2)

## 2019-07-24 LAB — SARS CORONAVIRUS 2 BY RT PCR (HOSPITAL ORDER, PERFORMED IN ~~LOC~~ HOSPITAL LAB): SARS Coronavirus 2: NEGATIVE

## 2019-07-24 LAB — BASIC METABOLIC PANEL
Anion gap: 10 (ref 5–15)
BUN: 13 mg/dL (ref 8–23)
CO2: 29 mmol/L (ref 22–32)
Calcium: 8.4 mg/dL — ABNORMAL LOW (ref 8.9–10.3)
Chloride: 101 mmol/L (ref 98–111)
Creatinine, Ser: 0.69 mg/dL (ref 0.44–1.00)
GFR calc Af Amer: >60 mL/min (ref >60)
GFR calc non Af Amer: >60 mL/min (ref >60)
Glucose, Bld: 106 mg/dL — ABNORMAL HIGH (ref 70–99)
Potassium: 4 mmol/L (ref 3.5–5.1)
Sodium: 140 mmol/L (ref 135–145)

## 2019-07-24 MED ORDER — SODIUM CHLORIDE 3 % IN NEBU
4.0000 mL | INHALATION_SOLUTION | Freq: Two times a day (BID) | RESPIRATORY_TRACT | Status: AC
  Administered 2019-07-24 – 2019-07-26 (×6): 4 mL via RESPIRATORY_TRACT
  Filled 2019-07-24 (×6): qty 4

## 2019-07-24 MED ORDER — WARFARIN SODIUM 5 MG PO TABS
5.0000 mg | ORAL_TABLET | Freq: Every day | ORAL | Status: DC
  Administered 2019-07-24 – 2019-08-03 (×11): 5 mg via ORAL
  Filled 2019-07-24 (×11): qty 1

## 2019-07-24 MED ORDER — IPRATROPIUM-ALBUTEROL 0.5-2.5 (3) MG/3ML IN SOLN
3.0000 mL | Freq: Four times a day (QID) | RESPIRATORY_TRACT | Status: DC
  Administered 2019-07-24 (×3): 3 mL via RESPIRATORY_TRACT
  Filled 2019-07-24 (×3): qty 3

## 2019-07-24 MED ORDER — CLOPIDOGREL BISULFATE 75 MG PO TABS
75.0000 mg | ORAL_TABLET | Freq: Every day | ORAL | Status: DC
  Administered 2019-07-24 – 2019-08-04 (×12): 75 mg via ORAL
  Filled 2019-07-24 (×12): qty 1

## 2019-07-24 MED ORDER — SODIUM CHLORIDE 0.9 % IV SOLN
500.0000 mg | INTRAVENOUS | Status: DC
  Administered 2019-07-24 – 2019-07-26 (×3): 500 mg via INTRAVENOUS
  Filled 2019-07-24 (×3): qty 500

## 2019-07-24 MED ORDER — PIPERACILLIN-TAZOBACTAM 3.375 G IVPB 30 MIN: 3.3750 g | Freq: Four times a day (QID) | INTRAVENOUS | Status: DC

## 2019-07-24 MED ORDER — WARFARIN - PHYSICIAN DOSING INPATIENT: Freq: Every day | Status: DC

## 2019-07-24 MED ORDER — SODIUM CHLORIDE 0.9 % IV SOLN
1.5000 g | Freq: Four times a day (QID) | INTRAVENOUS | Status: DC
  Administered 2019-07-24 – 2019-07-26 (×10): 1.5 g via INTRAVENOUS
  Filled 2019-07-24: qty 1.5
  Filled 2019-07-24 (×4): qty 4
  Filled 2019-07-24: qty 1.5
  Filled 2019-07-24: qty 4
  Filled 2019-07-24 (×3): qty 1.5
  Filled 2019-07-24: qty 4
  Filled 2019-07-24: qty 1.5

## 2019-07-24 MED ORDER — FUROSEMIDE 10 MG/ML IJ SOLN
40.0000 mg | Freq: Every day | INTRAMUSCULAR | Status: DC
  Administered 2019-07-24 – 2019-07-25 (×2): 40 mg via INTRAVENOUS
  Filled 2019-07-24 (×2): qty 4

## 2019-07-24 MED ORDER — KCL IN DEXTROSE-NACL 20-5-0.45 MEQ/L-%-% IV SOLN
INTRAVENOUS | Status: DC
  Filled 2019-07-24 (×2): qty 1000

## 2019-07-24 MED ORDER — LEVOTHYROXINE SODIUM 25 MCG PO TABS: 25.0000 ug | ORAL_TABLET | ORAL | Status: DC

## 2019-07-24 MED ORDER — IPRATROPIUM-ALBUTEROL 0.5-2.5 (3) MG/3ML IN SOLN
3.0000 mL | RESPIRATORY_TRACT | Status: DC | PRN
  Administered 2019-07-25 – 2019-07-27 (×3): 3 mL via RESPIRATORY_TRACT
  Filled 2019-07-24 (×3): qty 3

## 2019-07-24 MED ORDER — SODIUM CHLORIDE 0.9 % IV SOLN
1.5000 g | INTRAVENOUS | Status: AC
  Filled 2019-07-24: qty 4

## 2019-07-24 MED ORDER — IPRATROPIUM-ALBUTEROL 0.5-2.5 (3) MG/3ML IN SOLN
3.0000 mL | Freq: Three times a day (TID) | RESPIRATORY_TRACT | Status: DC
  Administered 2019-07-24 – 2019-07-27 (×8): 3 mL via RESPIRATORY_TRACT
  Filled 2019-07-24 (×8): qty 3

## 2019-07-24 MED ORDER — DIPHENOXYLATE-ATROPINE 2.5-0.025 MG PO TABS
1.0000 | ORAL_TABLET | Freq: Every day | ORAL | Status: DC | PRN
  Administered 2019-08-01 – 2019-08-03 (×2): 1 via ORAL
  Filled 2019-07-24 (×2): qty 1

## 2019-07-24 MED ORDER — LEVOTHYROXINE SODIUM 100 MCG/5ML IV SOLN: 12.5000 ug | Freq: Every day | INTRAVENOUS | Status: DC

## 2019-07-24 MED ORDER — LEVOTHYROXINE SODIUM 25 MCG PO TABS
25.0000 ug | ORAL_TABLET | Freq: Every day | ORAL | Status: DC
  Administered 2019-07-25 – 2019-08-04 (×11): 25 ug via ORAL
  Filled 2019-07-24 (×11): qty 1

## 2019-07-24 NOTE — Plan of Care (Signed)
Initiated care plan 

## 2019-07-24 NOTE — H&P (Addendum)
[facility] NAME: Amber [name]    MR#:  [id number]  DATE OF BIRTH:  1934/09/20  DATE OF ADMISSION:  07/23/2019  PRIMARY CARE PHYSICIAN: Antony Contras, MD   REQUESTING/REFERRING PHYSICIAN: Fredia Sorrow, MD  CHIEF COMPLAINT:   Chief Complaint  Patient presents with  . Cough  . Fever  . Shortness of Breath  . Fatigue    HISTORY OF PRESENT ILLNESS:  Amber [name]  is a 85 y.o. female with a known history of dementia, coronary artery disease, CVA, COPD, hypertension, hypothyroidism, chronic diastolic CHF and dyslipidemia, who presented to the emergency room with acute onset of recent worsening cough with associated dyspnea and inability to expectorate which has been intermittent lately.  Yesterday she was having wet rattling cough and when she eats with try to clear her throat.  There was a question about aspiration recently by her daughter who is a Marine scientist with solid food and fluids.  She had a fever last night and has after taking Tylenol her temperature was 99.9 without chills.  Her daughter noticed today that the patient was hypoxemic below 90 on pulse oximetry.  The patient was prescribed doxycycline by her PCP yesterday.  No reported nausea or vomiting.  No abdominal pain or melena or bright red bleeding per rectum.  She denies any dysuria, oliguria or hematuria or flank pain.  No chest pain or palpitations.  Upon presentation to the emergency room, respiratory it was 18 and later 23 with otherwise normal vital signs.  Labs revealed unremarkable CBC and BMP showing hypokalemia of 3.4 with negative LFTs.  INR was 2.2 on Coumadin and PTT 23.7 persistent blunting of the left costodiaphragmatic angle likely small effusion and/or atelectasis. EKG showed atrial fibrillation with controlled ventricular sponsor of 71 with right axis deviation and nonspecific repolarization.  Chest CTA revealed no evidence for PE but showed 1. Significant debris within the mid  trachea with fluid level, with debris extending into the left greater than right mainstem bronchi. There is short segment complete occlusion of left lower lobe bronchus with distal bronchial filling and mucoid impaction. Findings may be due to aspiration or mucous plugging. 2. Trace left pleural effusion. 3. Minimal peripheral opacity in the right upper lobe is unchanged from prior exam and suggestive of scarring. 4. Reflux of contrast into the hepatic veins and IVC consistent with elevated right heart pressures.  The case was discussed with Dr. Deatra Canter who reviewed the case and recommended pulmonary toiletry and swallow screen I did not think that the patient meets criteria for bronchoscopy at this time.  The patient will be admitted to a telemetry bed for further evaluation and management. PAST MEDICAL HISTORY:   Past Medical History:  Diagnosis Date  . Abnormality of gait 08/02/2014  . Cerebrovascular disease 05/21/2016  . Cervical spinal stenosis   . Chronic atrial fibrillation (HCC)    a. chronic/rate controlled;  b. chronic coumadin.  . Chronic diastolic CHF (congestive heart failure) ([place])    a. 02/2013 Echo: EF 55-60%, mild AI, mod dil LA.  . CKD (chronic kidney disease), stage III   . COPD with emphysema ([place])    PFT 05/02/10>>FEV1 1.35(62%), FEV1% 66, DLCO 75%  . Coronary artery disease    a. s/p CABG;  b. Abnormal nuc 2015 - managed medically.  . CVA (cerebral vascular accident) ([place])    left sided weakness  . Depression   . Fibromyalgia   . GERD (gastroesophageal reflux disease)  pepcid 2-3 times per week  . Gout   . Hemiparesis and alteration of sensations as late effects of stroke ([place]) 01/17/2015  . History of melanoma    squamous cell, melanoma  . Hyperlipidemia    a. statin intolerant, not felt to be candidate for PCSK9 due to chronically elevated CK levels.  . Hypertension   . Hypertensive heart disease   . Hypothyroidism   . Insomnia   . Memory change  01/23/2014  . Nasal polyposis   . Osteoarthritis   . Osteoporosis   . Pneumonia    1990  . Tobacco abuse     PAST SURGICAL HISTORY:   Past Surgical History:  Procedure Laterality Date  . ABDOMINAL HYSTERECTOMY    . BREAST LUMPECTOMY  1980s   Benign lesion - right  . carpel tunnel     right  . CORONARY ARTERY BYPASS GRAFT  2000  . EYE SURGERY     bilateral cataracts  . IR GENERIC HISTORICAL  03/31/2016   IR RADIOLOGIST EVAL & MGMT 03/31/2016 [facility]INTER[facility]  . RADIOLOGY WITH ANESTHESIA  12/24/2011   Procedure: RADIOLOGY WITH ANESTHESIA;  Surgeon: Medication Radiologist, MD;  Location: [facility];  Service: Radiology;  Laterality: N/A;  Extra Cranial Vascular Stent  . RADIOLOGY WITH ANESTHESIA N/A 07/19/2014   Procedure: ANGIOPLASTY;  Surgeon: Luanne Bras, MD;  Location: [facility];  Service: Radiology;  Laterality: N/A;  . RADIOLOGY WITH ANESTHESIA N/A 07/27/2014   Procedure: ANGIOPLASTY;  Surgeon: Luanne Bras, MD;  Location: [facility];  Service: Radiology;  Laterality: N/A;  . TONSILLECTOMY      SOCIAL HISTORY:   Social History   Tobacco Use  . Smoking status: Former Smoker    Packs/day: 0.40    Years: 25.00    Pack years: 10.00    Types: Cigarettes    Quit date: 02/10/1977    Years since quitting: 42.4  . Smokeless tobacco: Never Used  Substance Use Topics  . Alcohol use: No    FAMILY HISTORY:   Family History  Problem Relation Age of Onset  . Stroke Mother   . CAD Father   . Diabetes type II Brother   . CAD Brother     DRUG ALLERGIES:   Allergies  Allergen Reactions  . Ezetimibe Other (See Comments)    Myalgia  . Welchol [Colesevelam Hcl] Other (See Comments)    Muscle aches  . Adhesive [Tape] Itching, Rash and Other (See Comments)    Burning   . Ceclor [Cefaclor] Other (See Comments)    Unknown allergic reaction  . Elastic Bandages & [Zinc] Rash and Other (See Comments)    Turns red on the areas it touches  . Latex Itching, Rash and Other (See  Comments)    Burning   . Lipitor [Atorvastatin Calcium] Other (See Comments)    Increased fibromyalgia pain  . Mevacor [Lovastatin] Other (See Comments)    Increased fibromyalgia pain  . Pravachol Other (See Comments)    Increased fibromyalgia pain  . Vasotec Other (See Comments)    Unknown allergic reaction    REVIEW OF SYSTEMS:   ROS: Per her daughter: As per history of present illness. All pertinent systems were reviewed above. Constitutional,  HEENT, cardiovascular, respiratory, GI, GU, musculoskeletal, neuro, psychiatric, endocrine,  integumentary and hematologic systems were reviewed and are otherwise  negative/unremarkable except for positive findings mentioned above in the HPI.   MEDICATIONS AT HOME:   Prior to Admission medications   Medication Sig Start Date End  Date Taking? Authorizing Provider  allopurinol (ZYLOPRIM) 300 MG tablet Take 300 mg by mouth at bedtime.    Yes [provider]  amLODipine (NORVASC) 5 MG tablet TAKE 1 TABLET BY MOUTH EVERYDAY AT BEDTIME Patient taking differently: Take 5 mg by mouth at bedtime.  10/08/18  Yes Belva Crome, MD  celecoxib (CELEBREX) 200 MG capsule Take 200 mg by mouth every morning.    Yes [provider]  citalopram (CELEXA) 20 MG tablet TAKE 1 TABLET BY MOUTH TWICE A DAY Patient taking differently: Take 20 mg by mouth 2 (two) times daily.  11/09/14  Yes Darlin Coco, MD  clopidogrel (PLAVIX) 75 MG tablet TAKE 1 TABLET (75 MG TOTAL) BY MOUTH DAILY. 12/29/18  Yes Belva Crome, MD  diphenoxylate-atropine (LOMOTIL) 2.5-0.025 MG tablet Take 1 tablet by mouth every Monday, Wednesday, and Friday.    Yes [provider]  donepezil (ARICEPT) 5 MG tablet Take 1 tablet (5 mg total) by mouth daily. 04/19/19  Yes Suzzanne Cloud, NP  doxycycline (VIBRAMYCIN) 100 MG capsule Take 100 mg by mouth 2 (two) times daily. Start date : 07/22/19 07/22/19  Yes [provider]  ergocalciferol (VITAMIN D2) 50000  UNITS capsule Take 50,000 Units by mouth 2 (two) times a week. Tuesday and Friday   Yes [provider]  furosemide (LASIX) 80 MG tablet Take 80 mg by mouth daily.    Yes [provider]  levothyroxine (SYNTHROID, LEVOTHROID) 25 MCG tablet Take 25 mcg by mouth every morning.    Yes [provider]  losartan (COZAAR) 25 MG tablet TAKE 1 TABLET BY MOUTH EVERY DAY Patient taking differently: Take 25 mg by mouth daily.  10/04/18  Yes Burtis Junes, NP  memantine (NAMENDA) 10 MG tablet Take 1 tablet (10 mg total) by mouth 2 (two) times daily. 04/19/19  Yes Suzzanne Cloud, NP  metoprolol succinate (TOPROL-XL) 25 MG 24 hr tablet TAKE 1 TABLET BY MOUTH EVERY DAY Patient taking differently: Take 25 mg by mouth every morning.  10/11/18  Yes Belva Crome, MD  potassium chloride SA (KLOR-CON M20) 20 MEQ tablet TAKE 1 TABLET EVERY OTHER DAY, ALTERNATING WITH 2 TABLETS EVERY OTHER DAY Patient taking differently: Take 20 mEq by mouth every morning.  12/31/18  Yes Belva Crome, MD  warfarin (COUMADIN) 5 MG tablet TAKE AS DIRECTED BY COUMADIN CLINIC Patient taking differently: Take 5 mg by mouth daily.  05/13/19  Yes Belva Crome, MD  ZINC-VITAMIN C PO Take 1 tablet by mouth every evening.   Yes [provider]  fluticasone (FLONASE) 50 MCG/ACT nasal spray Place 1 spray into both nostrils daily.  07/22/19   [provider]  nitroGLYCERIN (NITROSTAT) 0.4 MG SL tablet Place 1 tablet (0.4 mg total) under the tongue every 5 (five) minutes as needed for chest pain. 09/13/12   Darlin Coco, MD  PROAIR HFA 108 (90 BASE) MCG/ACT inhaler INHALE 2 PUFFS INTO LUNGS EVERY 6 HOURS AS NEEDED FOR WHEEZING Patient not taking: Reported on 07/23/2019 12/22/13   Darlin Coco, MD  Respiratory Therapy Supplies (FLUTTER) DEVI Use as directed. 03/19/17   Parrett, Fonnie Mu, NP  traMADol (ULTRAM) 50 MG tablet Take 1 tablet (50 mg total) by mouth every 6 (six) hours as needed for moderate  pain. Patient not taking: Reported on 07/23/2019 04/30/19   Jillyn Ledger, PA-C      VITAL SIGNS:  Blood pressure (!) 143/100, pulse 85, temperature 98.2 F (36.8 C), temperature  source Oral, resp. rate (!) 24, height 5\' 6"  (1.676 m), weight 54.4 kg, SpO2 95 %.  PHYSICAL EXAMINATION:  Physical Exam  GENERAL:  85 y.o.-year-old Caucasian female patient lying in the bed with no acute distress.  EYES: Pupils equal, round, reactive to light and accommodation. No scleral icterus. Extraocular muscles intact.  HEENT: Head atraumatic, normocephalic. Oropharynx and nasopharynx clear.  NECK:  Supple, no jugular venous distention. No thyroid enlargement, no tenderness.  LUNGS: Diffuse expiratory rhonchi and wheezes with diminished expiratory airflow and harsh vesicular breathing. CARDIOVASCULAR: Regular rate and rhythm, S1, S2 normal. No murmurs, rubs, or gallops.  ABDOMEN: Soft, nondistended, nontender. Bowel sounds present. No organomegaly or mass.  EXTREMITIES: No pedal edema, cyanosis, or clubbing.  NEUROLOGIC: Cranial nerves II through XII are intact. Muscle strength 5/5 in all extremities. Sensation intact. Gait not checked.  PSYCHIATRIC: The patient is alert and oriented x 3.  Normal affect and good eye contact. SKIN: No obvious rash, lesion, or ulcer.   LABORATORY PANEL:   CBC Recent Labs  Lab 07/23/19 2017  WBC 8.5  HGB 13.4  HCT 42.5  PLT 176   ------------------------------------------------------------------------------------------------------------------  Chemistries  Recent Labs  Lab 07/23/19 2017  NA 145  K 3.4*  CL 98  CO2 31  GLUCOSE 96  BUN 15  CREATININE 0.85  CALCIUM 9.5  AST 60*  ALT 24  ALKPHOS 64  BILITOT 1.2   ------------------------------------------------------------------------------------------------------------------  Cardiac Enzymes No results for input(s): TROPONINI in the last 168  hours. ------------------------------------------------------------------------------------------------------------------  RADIOLOGY:  DG Chest 2 View  Result Date: 07/23/2019 CLINICAL DATA:  Cough and shortness of breath EXAM: CHEST - 2 VIEW COMPARISON:  04/30/2019 FINDINGS: Post median sternotomy for CABG. Cardiomediastinal contours are stable enlarged. Persistent blunting of the LEFT costodiaphragmatic angle. Minimal LEFT basilar atelectasis. Lungs are otherwise clear. Visualized skeletal structures to the extent evaluated are unremarkable. IMPRESSION: Persistent blunting of the LEFT costodiaphragmatic angle, likely small effusion and/or atelectasis. Electronically Signed   By: Zetta Bills M.D.   On: 07/23/2019 17:20   CT Angio Chest PE W/Cm &/Or Wo Cm  Result Date: 07/23/2019 CLINICAL DATA:  Cough, shortness of breath, fatigue and weakness for 4 days. EXAM: CT ANGIOGRAPHY CHEST WITH CONTRAST TECHNIQUE: Multidetector CT imaging of the chest was performed using the standard protocol during bolus administration of intravenous contrast. Multiplanar CT image reconstructions and MIPs were obtained to evaluate the vascular anatomy. CONTRAST:  133mL OMNIPAQUE IOHEXOL 350 MG/ML SOLN COMPARISON:  Radiograph earlier this day.  Chest CT 04/28/2019 FINDINGS: Cardiovascular: There are no filling defects within the pulmonary arteries to suggest pulmonary embolus. Thoracic aortic atherosclerosis. Post CABG with mild cardiomegaly and calcification of native coronary arteries. Contrast refluxes into the hepatic veins and IVC. Mediastinum/Nodes: Patulous esophagus. No enlarged mediastinal or hilar lymph nodes. No visualized thyroid nodules. Lungs/Pleura: Significant debris within the mid trachea with fluid level, with debris extending into the left greater than right mainstem bronchi resultant and mild luminal narrowing. There is short segment complete occlusion of left lower lobe bronchus with distal bronchial  filling and mucoid impaction. Bronchial filling in the right lower lobe to a lesser extent. Trace left pleural effusion. Minimal subpleural irregular opacity in the right upper lobe is stable from prior exam and likely scarring. Upper Abdomen: Reflux of contrast into the hepatic veins and IVC. Upper abdominal atherosclerosis. Musculoskeletal: Post median sternotomy. There are multiple chronic compression fractures throughout the thoracic spine, unchanged from prior. Healing left rib fractures. Review of the MIP images  confirms the above findings. IMPRESSION: 1. No pulmonary embolus. 2. Significant debris within the mid trachea with fluid level, with debris extending into the left greater than right mainstem bronchi. There is short segment complete occlusion of left lower lobe bronchus with distal bronchial filling and mucoid impaction. Findings may be due to aspiration or mucous plugging. 3. Trace left pleural effusion. 4. Minimal peripheral opacity in the right upper lobe is unchanged from prior exam and suggestive of scarring. 5. Reflux of contrast into the hepatic veins and IVC consistent with elevated right heart pressures. Aortic Atherosclerosis (ICD10-I70.0). Electronically Signed   By: Keith Rake M.D.   On: 07/23/2019 22:51      IMPRESSION AND PLAN:   1.  Acute bronchitis with suspected aspiration pneumonia, with subsequent hypoxemia.   -The patient will be admitted to a telemetry bed. -We will place her on antibiotic therapy with IV Unasyn and Zithromax.  She has had fever and expectorated green sputum and has clear signs of suspected aspiration. -We will obtain sputum and blood culture. -We will place her on mucolytic's and duo nebs on a scheduled and as needed basis. -Respiratory therapy consult will be obtained for pulmonary toiletry. -Speech therapy consult will be obtained for swallowing evaluation. -At this time pulmonary consult this held off as the patient does not meet criteria  for bronchoscopy.  The case was discussed with Dr. Deatra Canter as mentioned above.  2.  Elevated BNP that could be related to early cor pulmonale. -We will place her on her same dose of Lasix but in IV form and continue monitoring her.  3.  Hypokalemia. -We will replace her potassium and check magnesium level.  4.  Hypertension. -Antihypertensives will be continued and she will be placed on as needed IV labetalol with inability to swallow.  5.  Coronary artery disease. -We will continue her beta-blocker therapy and as needed sublingual nitroglycerin as well as Plavix.  6.  Depression. -Celexa will be resumed.  7.  Gout. -Allopurinol will be resumed.  8.  Dementia. -Aricept to be resumed.  9.  Chronic atrial fibrillation. -We will continue her Coumadin.  10.  DVT prophylaxis. -We will resume her Coumadin and follow daily INR.   All the records are reviewed and case discussed with ED provider. The plan of care was discussed in details with the patient (and family). I answered all questions. The patient agreed to proceed with the above mentioned plan. Further management will depend upon hospital course.   CODE STATUS: The patient is DNR/DNI.  This was discussed with the patient and her daughter who is her power of attorney.  Status is: Inpatient  Remains inpatient appropriate because:Ongoing diagnostic testing needed not appropriate for outpatient work up, Unsafe d/c plan, IV treatments appropriate due to intensity of illness or inability to take PO and Inpatient level of care appropriate due to severity of illness.  The patient came with hypoxia with clear evidence of aspiration, fever at home.  She will need pulmonary toiletry and speech therapy evaluation and will benefit from IV antibiotics.  She will be kept n.p.o. and required IV diuresis for possible associated cor pulmonale.  She will therefore likely need more than 2 midnights of hospitalization.  Dispo: The patient is from:  Home              Anticipated d/c is to: Home              Anticipated d/c date is: 2 days  Patient currently is not medically stable to d/c.   TOTAL TIME TAKING CARE OF THIS PATIENT: 55 minutes.    Christel Mormon M.D on 07/24/2019 at 12:16 AM  [facility]   From 7 PM-7 AM, contact night-coverage [email or web address]  CC: Primary care physician; Antony Contras, MD   Note: This dictation was prepared with Dragon dictation along with smaller phrase technology. Any transcriptional typo errors that result from this process are unintentional.

## 2019-07-24 NOTE — Progress Notes (Signed)
PROGRESS NOTE  Brief Narrative: Amber [name] is an 85 y.o. female with a history of remote CVA, chronic AFib on coumadin, stage IIIa CKD, COPD with emphysema, CAD, chronic HFpEF, HTN, hypothyroidism, GERD, osteoarthritis and fibromyalgia, moderate dementia who presented from home due to several days of increasing wet cough and dyspnea. On evaluation she was afebrile with hypoxia. WBC normal. CT chest revealed significant debris and fluid collections in airways, suspected mucous plugging. She has recently had sounds suggestive of choking with pills and liquids, and in this setting, aspiration pneumonia was suspected. Antibiotics were started, SLP evaluation requested, pulmonology consulted for consideration of bronchoscopy, and she was admitted by Dr. Sidney Ace this morning.   Subjective: SLP evaluation is pending, though the patient states she's not at all hungry or thirsty. Has been getting IVF, denies any current shortness of breath. More confused than her baseline per daughter who is an Therapist, sports at the bedside.  Objective: BP (!) 148/70 (BP Location: Right Arm)   Pulse 77   Temp 99 F (37.2 C) (Oral)   Resp 19   Ht 5\' 6"  (1.676 m)   Wt 54.4 kg   SpO2 96%   BMI 19.37 kg/m   Gen: Elderly in no distress, confused Pulm: Rhonchi throughout. Nonlabored  CV: Irreg irreg, no murmur, no JVD, no edema GI: Soft, NT, ND, +BS  Neuro: Moving all extremities. Skin: No rashes, lesions, ulcers  Assessment & Plan: Active Problems:   Hypoxia  Acute hypoxemic respiratory failure due to aspiration pneumonia, pneumonitis and tracheobronchitis on COPD without acute exacerbation:  - SLP evaluation requested, formal swallow study slated for tomorrow AM.  - Continue IV unasyn and azithromycin for now. Monitor blood cultures (NGTD) and sputum cultures (green sputum sent this morning).  - NPO diet as discussed with daughter who opts for the safest direction of care at this time, or per SLP recommendation. Can take  pills with applesauce as this hasn't produced overt aspiration, though will minimize pill burden as below.  - Will continue IVF therefore, monitoring volume status closely.  - Appreciate pulmonology assistance with management. Recommending chest PT as this has been effective in the past. Pt has osteoporosis and chronic-appearing compression fractures, so will monitor for tolerance. Bronchoscopy is last resort - Hypertonic saline BID, can give duonebs as well with hx COPD  Chronic HFpEF, HTN: Compensated currently though BNP is elevated this is chronic.  - Will continue home cardiac medications to avoid issues, will monitor volume status closely while giving IVF.  Chronic atrial fibrillation:  - Rate is controlled on metoprolol - Continue daily INR checks per policy, continue coumadin  CAD: No chest pain.  - Continue beta blocker. Will continue plavix as well  Gout:  - Hold ULT to minimize pill burden  Dementia with depression:  - Delirium precautions, would prefer that family be allowed unrestricted visitation to assist in avoidance of harm to patient due to sundowning. - Hold aricept and namenda to minimize pill burden acutely, will continue SSRI to avoid precipitating withdrawal, continue trazodone qHS prn to improve chances at maintain circadian rhythm  Hypokalemia: Improved - Avoid po supplement  History of UTI: No pyuria on urinalysis.   Hypothyroidism:  - Continue synthroid, converted to 1/2 dose IV to reduce pill burden but pharmacy states there's a shortage so will continue po.  Patrecia Pour, MD Pager on [facility] 07/24/2019, 5:02 PM

## 2019-07-24 NOTE — ED Provider Notes (Signed)
[facility] DEPT Provider Note   CSN: [id number] Arrival date & time: 07/23/19  1604     History Chief Complaint  Patient presents with  . Cough  . Fever  . Shortness of Breath  . Fatigue    Amber [name] is a 85 y.o. female.  Patient brought in by family member.  Patient has degree of some dementia.  She has had a productive cough with green sputum shortness of breath and fatigue and weakness for about 4 days.  Did have a Covid test done by [facility] walk-in clinic yesterday which was negative.  Patient was prescribed antibiotics yesterday by [facility].  It was doxycycline.  According to daughter patient's had some hypoxic episodes at home they have a pulse ox.  She will go below 90%.  Patient certainly has rattles that you can hear without the stethoscope.  Patient without fever here but history of fever at home.  Oxygen saturations on room air were 94%.  Heart rate 69 blood pressure 462 systolic.        Past Medical History:  Diagnosis Date  . Abnormality of gait 08/02/2014  . Cerebrovascular disease 05/21/2016  . Cervical spinal stenosis   . Chronic atrial fibrillation (HCC)    a. chronic/rate controlled;  b. chronic coumadin.  . Chronic diastolic CHF (congestive heart failure) ([place])    a. 02/2013 Echo: EF 55-60%, mild AI, mod dil LA.  . CKD (chronic kidney disease), stage III   . COPD with emphysema ([place])    PFT 05/02/10>>FEV1 1.35(62%), FEV1% 66, DLCO 75%  . Coronary artery disease    a. s/p CABG;  b. Abnormal nuc 2015 - managed medically.  . CVA (cerebral vascular accident) ([place])    left sided weakness  . Depression   . Fibromyalgia   . GERD (gastroesophageal reflux disease)    pepcid 2-3 times per week  . Gout   . Hemiparesis and alteration of sensations as late effects of stroke ([place]) 01/17/2015  . History of melanoma    squamous cell, melanoma  . Hyperlipidemia    a. statin intolerant, not felt to be candidate for PCSK9 due to  chronically elevated CK levels.  . Hypertension   . Hypertensive heart disease   . Hypothyroidism   . Insomnia   . Memory change 01/23/2014  . Nasal polyposis   . Osteoarthritis   . Osteoporosis   . Pneumonia    1990  . Tobacco abuse     Patient Active Problem List   Diagnosis Date Noted  . Hypoxia 07/23/2019  . Sprain of ankle 05/18/2019  . Foot callus 05/18/2019  . Fall (on) (from) other stairs and steps, initial encounter 04/28/2019  . Left rib fracture 04/28/2019  . Compression fracture of body of thoracic vertebra ([place]) 04/28/2019  . Acute pulmonary edema (HCC)   . Palliative care encounter   . Acute on chronic diastolic (congestive) heart failure ([place]) 03/04/2017  . COPD with acute exacerbation ([place]) 02/06/2017  . Acute on chronic respiratory failure with hypoxia ([place]) 02/06/2017  . Dementia ([place]) 02/06/2017  . Encounter for therapeutic drug monitoring 10/20/2016  . Cerebrovascular disease 05/21/2016  . Hemiparesis and alteration of sensations as late effects of stroke ([place]) 01/17/2015  . Memory change 01/23/2014  . 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 Long term current use of anticoagulant therapy   . Streptococcal pneumonia ([place]) 02/12/2013  . Vertebral artery stenosis 12/18/2011  . Fibromyalgia 05/10/2010  . Depression 05/10/2010  . Hypothyroidism 05/10/2010  .  Osteoporosis 05/10/2010  . Coronary artery disease involving coronary bypass graft of native heart with angina pectoris ([place]) 05/10/2010  . Chronic diastolic heart failure ([place])   . Dyslipidemia   . Atrial fibrillation ([place]) 05/06/2010    Past Surgical History:  Procedure Laterality Date  . ABDOMINAL HYSTERECTOMY    . BREAST LUMPECTOMY  1980s   Benign lesion - right  . carpel tunnel     right  . CORONARY ARTERY BYPASS GRAFT  2000  . EYE SURGERY     bilateral cataracts  . IR GENERIC HISTORICAL  03/31/2016   IR RADIOLOGIST EVAL & MGMT 03/31/2016 [facility]INTER[facility] RAD  . RADIOLOGY WITH ANESTHESIA   12/24/2011   Procedure: RADIOLOGY WITH ANESTHESIA;  Surgeon: Medication Radiologist, MD;  Location: [facility];  Service: Radiology;  Laterality: N/A;  Extra Cranial Vascular Stent  . RADIOLOGY WITH ANESTHESIA N/A 07/19/2014   Procedure: ANGIOPLASTY;  Surgeon: Luanne Bras, MD;  Location: [facility];  Service: Radiology;  Laterality: N/A;  . RADIOLOGY WITH ANESTHESIA N/A 07/27/2014   Procedure: ANGIOPLASTY;  Surgeon: Luanne Bras, MD;  Location: [facility];  Service: Radiology;  Laterality: N/A;  . TONSILLECTOMY       OB History   No obstetric history on file.     Family History  Problem Relation Age of Onset  . Stroke Mother   . CAD Father   . Diabetes type II Brother   . CAD Brother     Social History   Tobacco Use  . Smoking status: Former Smoker    Packs/day: 0.40    Years: 25.00    Pack years: 10.00    Types: Cigarettes    Quit date: 02/10/1977    Years since quitting: 42.4  . Smokeless tobacco: Never Used  Vaping Use  . Vaping Use: Never used  Substance Use Topics  . Alcohol use: No  . Drug use: No    Home Medications Prior to Admission medications   Medication Sig Start Date End Date Taking? Authorizing Provider  allopurinol (ZYLOPRIM) 300 MG tablet Take 300 mg by mouth at bedtime.    Yes [provider]  amLODipine (NORVASC) 5 MG tablet TAKE 1 TABLET BY MOUTH EVERYDAY AT BEDTIME Patient taking differently: Take 5 mg by mouth at bedtime.  10/08/18  Yes Belva Crome, MD  celecoxib (CELEBREX) 200 MG capsule Take 200 mg by mouth every morning.    Yes [provider]  citalopram (CELEXA) 20 MG tablet TAKE 1 TABLET BY MOUTH TWICE A DAY Patient taking differently: Take 20 mg by mouth 2 (two) times daily.  11/09/14  Yes Darlin Coco, MD  clopidogrel (PLAVIX) 75 MG tablet TAKE 1 TABLET (75 MG TOTAL) BY MOUTH DAILY. 12/29/18  Yes Belva Crome, MD  diphenoxylate-atropine (LOMOTIL) 2.5-0.025 MG tablet Take 1 tablet by mouth every Monday, Wednesday, and  Friday.    Yes [provider]  donepezil (ARICEPT) 5 MG tablet Take 1 tablet (5 mg total) by mouth daily. 04/19/19  Yes Suzzanne Cloud, NP  doxycycline (VIBRAMYCIN) 100 MG capsule Take 100 mg by mouth 2 (two) times daily. Start date : 07/22/19 07/22/19  Yes [provider]  ergocalciferol (VITAMIN D2) 50000 UNITS capsule Take 50,000 Units by mouth 2 (two) times a week. Tuesday and Friday   Yes [provider]  furosemide (LASIX) 80 MG tablet Take 80 mg by mouth daily.    Yes [provider]  levothyroxine (SYNTHROID, LEVOTHROID) 25 MCG tablet Take 25 mcg by mouth  every morning.    Yes [provider]  losartan (COZAAR) 25 MG tablet TAKE 1 TABLET BY MOUTH EVERY DAY Patient taking differently: Take 25 mg by mouth daily.  10/04/18  Yes Burtis Junes, NP  memantine (NAMENDA) 10 MG tablet Take 1 tablet (10 mg total) by mouth 2 (two) times daily. 04/19/19  Yes Suzzanne Cloud, NP  metoprolol succinate (TOPROL-XL) 25 MG 24 hr tablet TAKE 1 TABLET BY MOUTH EVERY DAY Patient taking differently: Take 25 mg by mouth every morning.  10/11/18  Yes Belva Crome, MD  potassium chloride SA (KLOR-CON M20) 20 MEQ tablet TAKE 1 TABLET EVERY OTHER DAY, ALTERNATING WITH 2 TABLETS EVERY OTHER DAY Patient taking differently: Take 20 mEq by mouth every morning.  12/31/18  Yes Belva Crome, MD  warfarin (COUMADIN) 5 MG tablet TAKE AS DIRECTED BY COUMADIN CLINIC Patient taking differently: Take 5 mg by mouth daily.  05/13/19  Yes Belva Crome, MD  ZINC-VITAMIN C PO Take 1 tablet by mouth every evening.   Yes [provider]  fluticasone (FLONASE) 50 MCG/ACT nasal spray Place 1 spray into both nostrils daily.  07/22/19   [provider]  nitroGLYCERIN (NITROSTAT) 0.4 MG SL tablet Place 1 tablet (0.4 mg total) under the tongue every 5 (five) minutes as needed for chest pain. 09/13/12   Darlin Coco, MD  PROAIR HFA 108 (90 BASE) MCG/ACT inhaler INHALE 2 PUFFS  INTO LUNGS EVERY 6 HOURS AS NEEDED FOR WHEEZING Patient not taking: Reported on 07/23/2019 12/22/13   Darlin Coco, MD  Respiratory Therapy Supplies (FLUTTER) DEVI Use as directed. 03/19/17   Parrett, Fonnie Mu, NP  traMADol (ULTRAM) 50 MG tablet Take 1 tablet (50 mg total) by mouth every 6 (six) hours as needed for moderate pain. Patient not taking: Reported on 07/23/2019 04/30/19   Jillyn Ledger, PA-C    Allergies    Ezetimibe, Welchol [colesevelam hcl], Adhesive [tape], Ceclor [cefaclor], Elastic bandages & [zinc], Latex, Lipitor [atorvastatin calcium], Mevacor [lovastatin], Pravachol, and Vasotec  Review of Systems   Review of Systems  Unable to perform ROS: Dementia    Physical Exam Updated Vital Signs BP (!) 143/100   Pulse 85   Temp 98.2 F (36.8 C) (Oral)   Resp (!) 24   Ht 1.676 m (5\' 6" )   Wt 54.4 kg   SpO2 95%   BMI 19.37 kg/m   Physical Exam Vitals and nursing note reviewed.  Constitutional:      General: She is not in acute distress.    Appearance: Normal appearance. She is well-developed.  HENT:     Head: Normocephalic and atraumatic.  Eyes:     Conjunctiva/sclera: Conjunctivae normal.     Pupils: Pupils are equal, round, and reactive to light.  Cardiovascular:     Rate and Rhythm: Normal rate and regular rhythm.     Heart sounds: No murmur heard.   Pulmonary:     Effort: Pulmonary effort is normal. No respiratory distress.     Breath sounds: Rhonchi present.  Abdominal:     Palpations: Abdomen is soft.     Tenderness: There is no abdominal tenderness.  Musculoskeletal:        General: No swelling.     Cervical back: Neck supple.  Skin:    General: Skin is warm and dry.     Capillary Refill: Capillary refill takes less than 2 seconds.  Neurological:     Mental Status: She is alert. Mental status  is at baseline.     ED Results / Procedures / Treatments   Labs (all labs ordered are listed, but only abnormal results are displayed) Labs  Reviewed  BRAIN NATRIURETIC PEPTIDE - Abnormal; Notable for the following components:      Result Value   B Natriuretic Peptide 513.1 (*)    All other components within normal limits  COMPREHENSIVE METABOLIC PANEL - Abnormal; Notable for the following components:   Potassium 3.4 (*)    AST 60 (*)    Anion gap 16 (*)    All other components within normal limits  PROTIME-INR - Abnormal; Notable for the following components:   Prothrombin Time 23.7 (*)    INR 2.2 (*)    All other components within normal limits  URINALYSIS, ROUTINE W REFLEX MICROSCOPIC - Abnormal; Notable for the following components:   Leukocytes,Ua SMALL (*)    Bacteria, UA RARE (*)    All other components within normal limits  SARS CORONAVIRUS 2 BY RT PCR (HOSPITAL ORDER, [facility])  CBC WITH DIFFERENTIAL/PLATELET  BASIC METABOLIC PANEL  CBC    EKG EKG Interpretation  Date/Time:  Saturday July 23 2019 16:27:04 EDT Ventricular Rate:  71 PR Interval:    QRS Duration: 86 QT Interval:  430 QTC Calculation: 468 R Axis:   81 Text Interpretation: Atrial fibrillation Borderline right axis deviation Nonspecific repol abnormality, diffuse leads 12 Lead; [place] Confirmed by Fredia Sorrow [phone]) on 07/23/2019 7:35:52 PM   Radiology DG Chest 2 View  Result Date: 07/23/2019 CLINICAL DATA:  Cough and shortness of breath EXAM: CHEST - 2 VIEW COMPARISON:  04/30/2019 FINDINGS: Post median sternotomy for CABG. Cardiomediastinal contours are stable enlarged. Persistent blunting of the LEFT costodiaphragmatic angle. Minimal LEFT basilar atelectasis. Lungs are otherwise clear. Visualized skeletal structures to the extent evaluated are unremarkable. IMPRESSION: Persistent blunting of the LEFT costodiaphragmatic angle, likely small effusion and/or atelectasis. Electronically Signed   By: Zetta Bills M.D.   On: 07/23/2019 17:20   CT Angio Chest PE W/Cm &/Or Wo Cm  Result Date:  07/23/2019 CLINICAL DATA:  Cough, shortness of breath, fatigue and weakness for 4 days. EXAM: CT ANGIOGRAPHY CHEST WITH CONTRAST TECHNIQUE: Multidetector CT imaging of the chest was performed using the standard protocol during bolus administration of intravenous contrast. Multiplanar CT image reconstructions and MIPs were obtained to evaluate the vascular anatomy. CONTRAST:  118mL OMNIPAQUE IOHEXOL 350 MG/ML SOLN COMPARISON:  Radiograph earlier this day.  Chest CT 04/28/2019 FINDINGS: Cardiovascular: There are no filling defects within the pulmonary arteries to suggest pulmonary embolus. Thoracic aortic atherosclerosis. Post CABG with mild cardiomegaly and calcification of native coronary arteries. Contrast refluxes into the hepatic veins and IVC. Mediastinum/Nodes: Patulous esophagus. No enlarged mediastinal or hilar lymph nodes. No visualized thyroid nodules. Lungs/Pleura: Significant debris within the mid trachea with fluid level, with debris extending into the left greater than right mainstem bronchi resultant and mild luminal narrowing. There is short segment complete occlusion of left lower lobe bronchus with distal bronchial filling and mucoid impaction. Bronchial filling in the right lower lobe to a lesser extent. Trace left pleural effusion. Minimal subpleural irregular opacity in the right upper lobe is stable from prior exam and likely scarring. Upper Abdomen: Reflux of contrast into the hepatic veins and IVC. Upper abdominal atherosclerosis. Musculoskeletal: Post median sternotomy. There are multiple chronic compression fractures throughout the thoracic spine, unchanged from prior. Healing left rib fractures. Review of the MIP images confirms the above findings. IMPRESSION:  1. No pulmonary embolus. 2. Significant debris within the mid trachea with fluid level, with debris extending into the left greater than right mainstem bronchi. There is short segment complete occlusion of left lower lobe bronchus  with distal bronchial filling and mucoid impaction. Findings may be due to aspiration or mucous plugging. 3. Trace left pleural effusion. 4. Minimal peripheral opacity in the right upper lobe is unchanged from prior exam and suggestive of scarring. 5. Reflux of contrast into the hepatic veins and IVC consistent with elevated right heart pressures. Aortic Atherosclerosis (ICD10-I70.0). Electronically Signed   By: Keith Rake M.D.   On: 07/23/2019 22:51    Procedures Procedures (including critical care time)  Medications Ordered in ED Medications  sodium chloride (PF) 0.9 % injection (has no administration in time range)  allopurinol (ZYLOPRIM) tablet 300 mg (has no administration in time range)  celecoxib (CELEBREX) capsule 200 mg (has no administration in time range)  amLODipine (NORVASC) tablet 5 mg (has no administration in time range)  losartan (COZAAR) tablet 25 mg (has no administration in time range)  metoprolol succinate (TOPROL-XL) 24 hr tablet 25 mg (has no administration in time range)  nitroGLYCERIN (NITROSTAT) SL tablet 0.4 mg (has no administration in time range)  citalopram (CELEXA) tablet 20 mg (has no administration in time range)  donepezil (ARICEPT) tablet 5 mg (has no administration in time range)  memantine (NAMENDA) tablet 10 mg (has no administration in time range)  levothyroxine (SYNTHROID) tablet 25 mcg (has no administration in time range)  diphenoxylate-atropine (LOMOTIL) 2.5-0.025 MG per tablet 1 tablet (has no administration in time range)  ergocalciferol (VITAMIN D2) capsule 50,000 Units (has no administration in time range)  potassium chloride SA (KLOR-CON) CR tablet 20 mEq (has no administration in time range)  fluticasone (FLONASE) 50 MCG/ACT nasal spray 1 spray (has no administration in time range)  enoxaparin (LOVENOX) injection 40 mg (has no administration in time range)  acetaminophen (TYLENOL) tablet 650 mg (has no administration in time range)    Or   acetaminophen (TYLENOL) suppository 650 mg (has no administration in time range)  traZODone (DESYREL) tablet 25 mg (has no administration in time range)  magnesium hydroxide (MILK OF MAGNESIA) suspension 30 mL (has no administration in time range)  ondansetron (ZOFRAN) tablet 4 mg (has no administration in time range)    Or  ondansetron (ZOFRAN) injection 4 mg (has no administration in time range)  0.9 % NaCl with KCl 20 mEq/ L  infusion (has no administration in time range)  iohexol (OMNIPAQUE) 350 MG/ML injection 100 mL (100 mLs Intravenous Contrast Given 07/23/19 2226)    ED Course  I have reviewed the triage vital signs and the nursing notes.  Pertinent labs & imaging results that were available during my care of the patient were reviewed by me and considered in my medical decision making (see chart for details).    MDM Rules/Calculators/A&P                          CT scan raise concern for significant mucous plugging.  Discussed with hospitalist and critical care.  Does not meet criteria for bronchoscope at this time.  Critical care is recommending admission pulmonary toilet and swallow screen.  CT scan showed no evidence of pulmonary embolus no evidence of pulmonary edema.  No evidence of pneumonia.  Patient did go on to drop her oxygen levels here on room air was started on oxygen.  Which goes  along with the story of some hypoxic episodes at home.  Oxygen saturations went down to 87%.  Hospitalist will admit.  Critical care did not recommend any antibiotics.    Final Clinical Impression(s) / ED Diagnoses Final diagnoses:  Bronchitis  Hypoxia    Rx / DC Orders ED Discharge Orders    None       Fredia Sorrow, MD 07/24/19 0013

## 2019-07-24 NOTE — Consult Note (Signed)
NAME:  Amber [name], MRN:  [id number], DOB:  [birth year], LOS: 1 ADMISSION DATE:  07/23/2019, CONSULTATION DATE: June 13 REFERRING MD: Dr. Bonner Puna, CHIEF COMPLAINT: Cough, dyspnea  Brief History   85 year old female admitted with aspiration pneumonia, pulmonary consulted to consider bronchoscopy  History of present illness   This is an 85 year old female who has a past medical history significant for stroke over 20 years ago and dementia who was admitted on June 13 in the setting of several days of increasing cough and shortness of breath.  Her daughter provides the history because the patient is very confused.  She lives at home with her daughters who take turns caring for her.  Apparently she chokes on liquids fairly frequently though she has no trouble swallowing solid food.  She has had several episodes of pneumonia over the last 8 to 10 years and has been hospitalized during which time she benefited from chest PT to help clear the mucus out.  Her daughter says this seems very similar to previous episodes.  On June 11 she started coughing more and it sounded as if she was "drowning on her own secretions" according to her daughter.  She says that she is not sure if her mother had a fever at home but she did have increasing shortness of breath so she brought her to the emergency room.  She had a CT scan of her chest which showed significant fluid collections in her airways.  She was admitted to the hospital, treated with antibiotics and pulmonary medicine was consulted for consideration of a bronchoscopy.  Past Medical History  Stroke Chronic A. fib Chronic diastolic heart failure CKD stage III COPD with emphysema Coronary artery disease GERD History of melanoma Hyperlipidemia Hypertension Hypothyroidism Tobacco abuse previous Osteoporosis Osteoarthritis Fibromyalgia  Significant Hospital Events   6/13 admitted  Consults:  PCCM  Procedures:    Significant Diagnostic Tests:   June 13 CT angiogram chest: No PE, significant fluid collection in the trachea and bilateral mainstem bronchi, no infiltrate  Micro Data:  June 13 blood culture June 12 SARS-CoV-2 negative  Antimicrobials:  June 13 Unasyn June 13 azithromycin  Interim history/subjective:  As above  Objective   Blood pressure (!) 153/72, pulse 93, temperature 99 F (37.2 C), temperature source Oral, resp. rate (!) 21, height 5\' 6"  (1.676 m), weight 54.4 kg, SpO2 100 %.        Intake/Output Summary (Last 24 hours) at 07/24/2019 0957 Last data filed at 07/24/2019 0524 Gross per 24 hour  Intake 654.79 ml  Output --  Net 654.79 ml   Filed Weights   07/23/19 1627  Weight: 54.4 kg    Examination: General:  Resting comfortably in bed HENT: NCAT OP clear PULM: rhonchi in bases bilaterally, normal effort CV: Irreg irreg, no mgr GI: BS+, soft, nontender MSK: normal bulk and tone Neuro: confused, follows commands, [facility] Problem list     Assessment & Plan:  Aspiration of food with tracheobronchitis in setting of dementia: N.p.o. except for meds with applesauce Speech evaluation Start chest PT with therapy vest as this is worked previously Hypertonic saline twice a day If no improvement in rhonchi, mucociliary clearance then consider bronchoscopy though in general this would be a last resort option  Best practice:   Per TRH I updated daughter bedside  Labs   CBC: Recent Labs  Lab 07/23/19 2017 07/24/19 0411  WBC 8.5 9.8  NEUTROABS 5.9  --   HGB 13.4 12.8  HCT 42.5 40.4  MCV 94.4 94.2  PLT 176 267    Basic Metabolic Panel: Recent Labs  Lab 07/23/19 2017 07/24/19 0411  NA 145 140  K 3.4* 4.0  CL 98 101  CO2 31 29  GLUCOSE 96 106*  BUN 15 13  CREATININE 0.85 0.69  CALCIUM 9.5 8.4*   GFR: Estimated Creatinine Clearance: 46.6 mL/min (by C-G formula based on SCr of 0.69 mg/dL). Recent Labs  Lab 07/23/19 2017 07/24/19 0411  WBC 8.5 9.8     Liver Function Tests: Recent Labs  Lab 07/23/19 2017  AST 60*  ALT 24  ALKPHOS 64  BILITOT 1.2  PROT 7.6  ALBUMIN 4.7   No results for input(s): LIPASE, AMYLASE in the last 168 hours. No results for input(s): AMMONIA in the last 168 hours.  ABG    Component Value Date/Time   HCO3 27.2 02/06/2017 0715   TCO2 29 09/07/2011 2024   O2SAT 74.8 02/06/2017 0715     Coagulation Profile: Recent Labs  Lab 07/23/19 2017 07/24/19 0411  INR 2.2* 2.2*    Cardiac Enzymes: No results for input(s): CKTOTAL, CKMB, CKMBINDEX, TROPONINI in the last 168 hours.  HbA1C: Hgb A1c MFr Bld  Date/Time Value Ref Range Status  03/05/2017 07:16 AM 6.0 (H) 4.8 - 5.6 % Final    Comment:    (NOTE) Pre diabetes:          5.7%-6.4% Diabetes:              >6.4% Glycemic control for   <7.0% adults with diabetes   02/14/2013 02:10 AM 6.3 (H) <5.7 % Final    Comment:    (NOTE)                                                                       According to the ADA Clinical Practice Recommendations for 2011, when HbA1c is used as a screening test:  >=6.5%   Diagnostic of Diabetes Mellitus           (if abnormal result is confirmed) 5.7-6.4%   Increased risk of developing Diabetes Mellitus References:Diagnosis and Classification of Diabetes Mellitus,Diabetes TIWP,8099,83(JASNK 1):S62-S69 and Standards of Medical Care in         Diabetes - 2011,Diabetes NLZJ,6734,19 (Suppl 1):S11-S61.    CBG: No results for input(s): GLUCAP in the last 168 hours.  Review of Systems:   Gen: Denies fever, chills, weight change, fatigue, night sweats HEENT: Denies blurred vision, double vision, hearing loss, tinnitus, sinus congestion, rhinorrhea, sore throat, neck stiffness, dysphagia PULM: rhonchi in bases CV: Denies chest pain, edema, orthopnea, paroxysmal nocturnal dyspnea, palpitations GI: Denies abdominal pain, nausea, vomiting, diarrhea, hematochezia, melena, constipation, change in bowel  habits GU: Denies dysuria, hematuria, polyuria, oliguria, urethral discharge Endocrine: Denies hot or cold intolerance, polyuria, polyphagia or appetite change Derm: Denies rash, dry skin, scaling or peeling skin change Heme: Denies easy bruising, bleeding, bleeding gums Neuro: Denies headache, numbness, weakness, slurred speech, loss of memory or consciousness   Past Medical History  She,  has a past medical history of Abnormality of gait (08/02/2014), Cerebrovascular disease (05/21/2016), Cervical spinal stenosis, Chronic atrial fibrillation (HCC), Chronic diastolic CHF (congestive heart failure) ([place]), CKD (chronic kidney disease), stage III, COPD with  emphysema ([place]), Coronary artery disease, CVA (cerebral vascular accident) ([name]), Depression, Fibromyalgia, GERD (gastroesophageal reflux disease), Gout, Hemiparesis and alteration of sensations as late effects of stroke ([place]) (01/17/2015), History of melanoma, Hyperlipidemia, Hypertension, Hypertensive heart disease, Hypothyroidism, Insomnia, Memory change (01/23/2014), Nasal polyposis, Osteoarthritis, Osteoporosis, Pneumonia, and Tobacco abuse.   Surgical History    Past Surgical History:  Procedure Laterality Date  . ABDOMINAL HYSTERECTOMY    . BREAST LUMPECTOMY  1980s   Benign lesion - right  . carpel tunnel     right  . CORONARY ARTERY BYPASS GRAFT  2000  . EYE SURGERY     bilateral cataracts  . IR GENERIC HISTORICAL  03/31/2016   IR RADIOLOGIST EVAL & MGMT 03/31/2016 [facility]  . RADIOLOGY WITH ANESTHESIA  12/24/2011   Procedure: RADIOLOGY WITH ANESTHESIA;  Surgeon: Medication Radiologist, MD;  Location: [place];  Service: Radiology;  Laterality: N/A;  Extra Cranial Vascular Stent  . RADIOLOGY WITH ANESTHESIA N/A 07/19/2014   Procedure: ANGIOPLASTY;  Surgeon: Luanne Bras, MD;  Location: [place];  Service: Radiology;  Laterality: N/A;  . RADIOLOGY WITH ANESTHESIA N/A 07/27/2014   Procedure: ANGIOPLASTY;  Surgeon: Luanne Bras,  MD;  Location: [place];  Service: Radiology;  Laterality: N/A;  . TONSILLECTOMY       Social History   reports that she quit smoking about 42 years ago. Her smoking use included cigarettes. She has a 10.00 pack-year smoking history. She has never used smokeless tobacco. She reports that she does not drink alcohol and does not use drugs.   Family History   Her family history includes CAD in her brother and father; Diabetes type II in her brother; Stroke in her mother.   Allergies Allergies  Allergen Reactions  . Ezetimibe Other (See Comments)    Myalgia  . Welchol [Colesevelam Hcl] Other (See Comments)    Muscle aches  . Adhesive [Tape] Itching, Rash and Other (See Comments)    Burning   . Ceclor [Cefaclor] Other (See Comments)    Unknown allergic reaction  . Elastic Bandages & [Zinc] Rash and Other (See Comments)    Turns red on the areas it touches  . Latex Itching, Rash and Other (See Comments)    Burning   . Lipitor [Atorvastatin Calcium] Other (See Comments)    Increased fibromyalgia pain  . Mevacor [Lovastatin] Other (See Comments)    Increased fibromyalgia pain  . Pravachol Other (See Comments)    Increased fibromyalgia pain  . Vasotec Other (See Comments)    Unknown allergic reaction     Home Medications  Prior to Admission medications   Medication Sig Start Date End Date Taking? Authorizing Provider  allopurinol (ZYLOPRIM) 300 MG tablet Take 300 mg by mouth at bedtime.    Yes [provider]  amLODipine (NORVASC) 5 MG tablet TAKE 1 TABLET BY MOUTH EVERYDAY AT BEDTIME Patient taking differently: Take 5 mg by mouth at bedtime.  10/08/18  Yes Belva Crome, MD  celecoxib (CELEBREX) 200 MG capsule Take 200 mg by mouth every morning.    Yes [provider]  citalopram (CELEXA) 20 MG tablet TAKE 1 TABLET BY MOUTH TWICE A DAY Patient taking differently: Take 20 mg by mouth 2 (two) times daily.  11/09/14  Yes Darlin Coco, MD  clopidogrel (PLAVIX) 75 MG  tablet TAKE 1 TABLET (75 MG TOTAL) BY MOUTH DAILY. 12/29/18  Yes Belva Crome, MD  diphenoxylate-atropine (LOMOTIL) 2.5-0.025 MG tablet Take 1 tablet by mouth every Monday, Wednesday, and  Friday.    Yes [provider]  donepezil (ARICEPT) 5 MG tablet Take 1 tablet (5 mg total) by mouth daily. 04/19/19  Yes Suzzanne Cloud, NP  doxycycline (VIBRAMYCIN) 100 MG capsule Take 100 mg by mouth 2 (two) times daily. Start date : 07/22/19 07/22/19  Yes [provider]  ergocalciferol (VITAMIN D2) 50000 UNITS capsule Take 50,000 Units by mouth 2 (two) times a week. Tuesday and Friday   Yes [provider]  furosemide (LASIX) 80 MG tablet Take 80 mg by mouth daily.    Yes [provider]  levothyroxine (SYNTHROID, LEVOTHROID) 25 MCG tablet Take 25 mcg by mouth every morning.    Yes [provider]  losartan (COZAAR) 25 MG tablet TAKE 1 TABLET BY MOUTH EVERY DAY Patient taking differently: Take 25 mg by mouth daily.  10/04/18  Yes Burtis Junes, NP  memantine (NAMENDA) 10 MG tablet Take 1 tablet (10 mg total) by mouth 2 (two) times daily. 04/19/19  Yes Suzzanne Cloud, NP  metoprolol succinate (TOPROL-XL) 25 MG 24 hr tablet TAKE 1 TABLET BY MOUTH EVERY DAY Patient taking differently: Take 25 mg by mouth every morning.  10/11/18  Yes Belva Crome, MD  potassium chloride SA (KLOR-CON M20) 20 MEQ tablet TAKE 1 TABLET EVERY OTHER DAY, ALTERNATING WITH 2 TABLETS EVERY OTHER DAY Patient taking differently: Take 20 mEq by mouth every morning.  12/31/18  Yes Belva Crome, MD  warfarin (COUMADIN) 5 MG tablet TAKE AS DIRECTED BY COUMADIN CLINIC Patient taking differently: Take 5 mg by mouth daily.  05/13/19  Yes Belva Crome, MD  ZINC-VITAMIN C PO Take 1 tablet by mouth every evening.   Yes [provider]  fluticasone (FLONASE) 50 MCG/ACT nasal spray Place 1 spray into both nostrils daily.  07/22/19   [provider]  nitroGLYCERIN (NITROSTAT) 0.4 MG SL  tablet Place 1 tablet (0.4 mg total) under the tongue every 5 (five) minutes as needed for chest pain. 09/13/12   Darlin Coco, MD  PROAIR HFA 108 (90 BASE) MCG/ACT inhaler INHALE 2 PUFFS INTO LUNGS EVERY 6 HOURS AS NEEDED FOR WHEEZING Patient not taking: Reported on 07/23/2019 12/22/13   Darlin Coco, MD  Respiratory Therapy Supplies (FLUTTER) DEVI Use as directed. 03/19/17   Parrett, Fonnie Mu, NP  traMADol (ULTRAM) 50 MG tablet Take 1 tablet (50 mg total) by mouth every 6 (six) hours as needed for moderate pain. Patient not taking: Reported on 07/23/2019 04/30/19   Jillyn Ledger, PA-C     Critical care time: n/a    Roselie Awkward, MD [facility] Pager: [phone] Cell: [phone] If no response, call [phone]

## 2019-07-24 NOTE — Evaluation (Signed)
Clinical/Bedside Swallow Evaluation Patient Details  Name: Amber [name] MRN: [id number] Date of Birth: November 27, 1934  Today's Date: 07/24/2019 Time: SLP Start Time (ACUTE ONLY): 3716 SLP Stop Time (ACUTE ONLY): 1628 SLP Time Calculation (min) (ACUTE ONLY): 31 min  Past Medical History:  Past Medical History:  Diagnosis Date  . Abnormality of gait 08/02/2014  . Cerebrovascular disease 05/21/2016  . Cervical spinal stenosis   . Chronic atrial fibrillation (HCC)    a. chronic/rate controlled;  b. chronic coumadin.  . Chronic diastolic CHF (congestive heart failure) ([place])    a. 02/2013 Echo: EF 55-60%, mild AI, mod dil LA.  . CKD (chronic kidney disease), stage III   . COPD with emphysema ([place])    PFT 05/02/10>>FEV1 1.35(62%), FEV1% 66, DLCO 75%  . Coronary artery disease    a. s/p CABG;  b. Abnormal nuc 2015 - managed medically.  . CVA (cerebral vascular accident) ([place])    left sided weakness  . Depression   . Fibromyalgia   . GERD (gastroesophageal reflux disease)    pepcid 2-3 times per week  . Gout   . Hemiparesis and alteration of sensations as late effects of stroke ([place]) 01/17/2015  . History of melanoma    squamous cell, melanoma  . Hyperlipidemia    a. statin intolerant, not felt to be candidate for PCSK9 due to chronically elevated CK levels.  . Hypertension   . Hypertensive heart disease   . Hypothyroidism   . Insomnia   . Memory change 01/23/2014  . Nasal polyposis   . Osteoarthritis   . Osteoporosis   . Pneumonia    1990  . Tobacco abuse    Past Surgical History:  Past Surgical History:  Procedure Laterality Date  . ABDOMINAL HYSTERECTOMY    . BREAST LUMPECTOMY  1980s   Benign lesion - right  . carpel tunnel     right  . CORONARY ARTERY BYPASS GRAFT  2000  . EYE SURGERY     bilateral cataracts  . IR GENERIC HISTORICAL  03/31/2016   IR RADIOLOGIST EVAL & MGMT 03/31/2016 [facility]  . RADIOLOGY WITH ANESTHESIA  12/24/2011   Procedure: RADIOLOGY WITH  ANESTHESIA;  Surgeon: Medication Radiologist, MD;  Location: [facility];  Service: Radiology;  Laterality: N/A;  Extra Cranial Vascular Stent  . RADIOLOGY WITH ANESTHESIA N/A 07/19/2014   Procedure: ANGIOPLASTY;  Surgeon: Luanne Bras, MD;  Location: [facility];  Service: Radiology;  Laterality: N/A;  . RADIOLOGY WITH ANESTHESIA N/A 07/27/2014   Procedure: ANGIOPLASTY;  Surgeon: Luanne Bras, MD;  Location: [place];  Service: Radiology;  Laterality: N/A;  . TONSILLECTOMY     HPI:  Amber [name]  is a 85 y.o. female with a known history of dementia, coronary artery disease, CVA, COPD, hypertension, hypothyroidism, chronic diastolic CHF and dyslipidemia, who presented to the emergency room with acute onset of recent worsening cough with associated dyspnea and inability to expectorate.  Chest CT concerning for aspiration PNA vs mucous plugging.  Pt completed an MBS on 02/17/2017 with recommendations for regular solids and thin liquids.    Assessment / Plan / Recommendation Clinical Impression  Pt was seen for a bedside swallow evaluation in the setting of possible aspiration PNA.  Pt completed a MBS in 2019 with reported functional oropharyngeal swallow abilities.  Pt was encountered awake/alert with her daughter present at bedside.  Daughter reported that the pt intermittently coughs with thin liquid at home, and she additionally reported that the pt has had frequent PNA  in the past few years.  Oral mechanism exam was unremarkable.  Pt wears top dentures and has adequate natural bottom dentition.  Pt consumed trials of thin liquid, nectar-thick liquid, honey-thick liquid, and puree.  She exhibited an immediate cough following the thin liquid trial and increased congestion was noted following nectar-thick and honey-thick liquid trials.  Pt exhibited a baseline, congested cough upon arrival; therefore difficult to determine if congestion was related to nectar-thick liquid or honey-thick liquid intake, or if it was  baseline from her PNA.  No increased congestion or coughing/throat clearing was observed with puree trials across 4oz.  Recommend an instrumental swallow study to further evaluate swallow function and to help determine the safest/least restrictive diet.  At this time, recommend Dysphagia 1 (puree) solids and pudding-thick liquids (puree consistency) with medications administered crushed in puree.  [name]     SLP Visit Diagnosis: Dysphagia, unspecified (R13.10)    Aspiration Risk  Moderate aspiration risk;Risk for inadequate nutrition/hydration    Diet Recommendation Dysphagia 1 (Puree);Pudding-thick liquid   Liquid Administration via: Cup;Spoon Medication Administration: Crushed with puree Supervision: Staff to assist with self feeding;Full supervision/cueing for compensatory strategies Compensations: Minimize environmental distractions;Slow rate;Small sips/bites Postural Changes: Seated upright at 90 degrees    Other  Recommendations Oral Care Recommendations: Oral care BID;Staff/trained caregiver to provide oral care Other Recommendations: Have oral suction available;Prohibited food (jello, ice cream, thin soups);Remove water pitcher   Follow up Recommendations Skilled Nursing facility;24 hour supervision/assistance      Frequency and Duration min 2x/week  2 weeks       Prognosis Prognosis for Safe Diet Advancement: Fair Barriers to Reach Goals: Cognitive deficits      Swallow Study   General HPI: Amber [name]  is a 85 y.o. female with a known history of dementia, coronary artery disease, CVA, COPD, hypertension, hypothyroidism, chronic diastolic CHF and dyslipidemia, who presented to the emergency room with acute onset of recent worsening cough with associated dyspnea and inability to expectorate.  Chest CT concerning for aspiration PNA vs mucous plugging.  Pt completed an MBS on 02/17/2017 with recommendations for regular solids and thin liquids.  Type of Study: Bedside Swallow  Evaluation Previous Swallow Assessment: MBS  Diet Prior to this Study: NPO Temperature Spikes Noted: Yes Respiratory Status: Nasal cannula History of Recent Intubation: No Behavior/Cognition: Alert;Cooperative;Pleasant mood;Confused Oral Cavity Assessment: Within Functional Limits Oral Care Completed by SLP: No Oral Cavity - Dentition: Dentures, top;Other (Comment) (adequate natural dentition on bottom ) Vision: Functional for self-feeding Self-Feeding Abilities: Needs assist Patient Positioning: Upright in bed Baseline Vocal Quality: Low vocal intensity Volitional Cough: Weak;Congested Volitional Swallow: Able to elicit    Oral/Motor/Sensory Function Overall Oral Motor/Sensory Function: Within functional limits   Ice Chips Ice chips: Not tested   Thin Liquid Thin Liquid: Impaired Presentation: Spoon Pharyngeal  Phase Impairments: Suspected delayed Swallow;Cough - Immediate    Nectar Thick Nectar Thick Liquid: Impaired Presentation: Spoon;Straw Pharyngeal Phase Impairments: Suspected delayed Swallow;Other (comments) (congestion )   Honey Thick Honey Thick Liquid: Impaired Presentation: Spoon;Straw Pharyngeal Phase Impairments: Suspected delayed Swallow (congestion )   Puree Puree: Within functional limits Presentation: Spoon   Solid     Solid: Not tested     Colin Mulders M.S., [place] Office: [phone]  [place] 07/24/2019,4:39 PM

## 2019-07-25 ENCOUNTER — Inpatient Hospital Stay (HOSPITAL_COMMUNITY): Payer: Medicare PPO

## 2019-07-25 DIAGNOSIS — J449 Chronic obstructive pulmonary disease, unspecified: Secondary | ICD-10-CM

## 2019-07-25 DIAGNOSIS — J690 Pneumonitis due to inhalation of food and vomit: Principal | ICD-10-CM

## 2019-07-25 DIAGNOSIS — J069 Acute upper respiratory infection, unspecified: Secondary | ICD-10-CM

## 2019-07-25 LAB — BASIC METABOLIC PANEL
Anion gap: 11 (ref 5–15)
BUN: 17 mg/dL (ref 8–23)
CO2: 28 mmol/L (ref 22–32)
Calcium: 8.5 mg/dL — ABNORMAL LOW (ref 8.9–10.3)
Chloride: 103 mmol/L (ref 98–111)
Creatinine, Ser: 0.71 mg/dL (ref 0.44–1.00)
GFR calc Af Amer: >60 mL/min (ref >60)
GFR calc non Af Amer: >60 mL/min (ref >60)
Glucose, Bld: 126 mg/dL — ABNORMAL HIGH (ref 70–99)
Potassium: 3.1 mmol/L — ABNORMAL LOW (ref 3.5–5.1)
Sodium: 142 mmol/L (ref 135–145)

## 2019-07-25 LAB — PROTIME-INR
INR: 2.3 — ABNORMAL HIGH (ref 0.8–1.2)
Prothrombin Time: 24.4 seconds — ABNORMAL HIGH (ref 11.4–15.2)

## 2019-07-25 MED ORDER — ALLOPURINOL 300 MG PO TABS
300.0000 mg | ORAL_TABLET | Freq: Every day | ORAL | Status: DC
  Administered 2019-07-25 – 2019-08-03 (×10): 300 mg via ORAL
  Filled 2019-07-25 (×10): qty 1

## 2019-07-25 MED ORDER — KCL IN DEXTROSE-NACL 40-5-0.45 MEQ/L-%-% IV SOLN
INTRAVENOUS | Status: DC
  Filled 2019-07-25: qty 1000

## 2019-07-25 MED ORDER — POTASSIUM CHLORIDE 10 MEQ/100ML IV SOLN
10.0000 meq | INTRAVENOUS | Status: AC
  Administered 2019-07-25 – 2019-07-26 (×4): 10 meq via INTRAVENOUS
  Filled 2019-07-25 (×2): qty 100

## 2019-07-25 MED ORDER — MEMANTINE HCL 10 MG PO TABS
10.0000 mg | ORAL_TABLET | Freq: Two times a day (BID) | ORAL | Status: DC
  Administered 2019-07-25 – 2019-08-04 (×20): 10 mg via ORAL
  Filled 2019-07-25 (×20): qty 1

## 2019-07-25 MED ORDER — DONEPEZIL HCL 10 MG PO TABS
5.0000 mg | ORAL_TABLET | Freq: Every day | ORAL | Status: DC
  Administered 2019-07-25 – 2019-08-04 (×11): 5 mg via ORAL
  Filled 2019-07-25 (×10): qty 1

## 2019-07-25 MED ORDER — GUAIFENESIN ER 600 MG PO TB12
1200.0000 mg | ORAL_TABLET | Freq: Two times a day (BID) | ORAL | Status: DC
  Administered 2019-07-25 – 2019-08-04 (×21): 1200 mg via ORAL
  Filled 2019-07-25 (×21): qty 2

## 2019-07-25 NOTE — Progress Notes (Signed)
NAME:  Amber [name], MRN:  [id number], DOB:  [birth year], LOS: 2 ADMISSION DATE:  07/23/2019, CONSULTATION DATE:  07/24/2019 REFERRING MD:  Dr. Bonner Puna, [facility], CHIEF COMPLAINT:  Cough   Brief History   85 yo female former smoker with hx of COPD and asthma developed rhinitis, cough, dyspnea, lethargy starting on 07/20/19.  She was at recent family gathering and several family members developed upper respiratory infection symptoms.  She has received COVID vaccination and COVID PCR was negative.  CT chest showed findings concerning for aspiration.  PCCM consulted to assist with respiratory management.  Past Medical History  Osteoporosis, Osteoarthritis, Nasal polyps, Hypothyroidism, Diastolic CHF, HTN, HLD, Melanoma, CVA, Gout, Dementia, GERD, Fibromyalgia, Depression, CAD s/p CABG, CKD 3a, Chronic a fib, Cervical spinal stenossi  Significant Hospital Events   6/12 Admit  Consults:    Procedures:    Significant Diagnostic Tests:   Echo 02/09/17 >> EF 60 to 65%, mild AS, mild MS, mod LA dilation, mod TR, PAS 52 mmHg  PFT 05/13/17 >> FEV1 1.24 (66%), FEV1% 71, TLC 4.48 (90%), DLCO 56%, +BD  CT angio chest 07/23/19 >> patulous esophagus, debris in trachea extending to mainstem bronchi  Micro Data:  SARS CoV2 PCR 6/12 >> negative Sputum 6/13 >>  Blood 6/13 >> RVP 6/14 >>   Antimicrobials:  Unasyn 6/12 >> Zithromax 6/12 >>  Interim history/subjective:  Had further swallow assessment this AM.  Family in room reports that several family members developed upper respiratory infection after recent family gathering.  Objective   Blood pressure 101/70, pulse 72, temperature 98.6 F (37 C), temperature source Axillary, resp. rate 18, height 5\' 6"  (1.676 m), weight 54.4 kg, SpO2 97 %.        Intake/Output Summary (Last 24 hours) at 07/25/2019 0956 Last data filed at 07/25/2019 0506 Gross per 24 hour  Intake 1347.73 ml  Output 1200 ml  Net 147.73 ml   Filed Weights   07/23/19 1627   Weight: 54.4 kg    Examination:  General - sleepy Eyes - pupils reactive ENT - no sinus tenderness, no stridor, gurgly Cardiac - irregular rate/rhythm, no murmur Chest - decreased BS, no wheeze Abdomen - soft, non tender, + bowel sounds Extremities - no cyanosis, clubbing, or edema Skin - no rashes  Assessment & Plan:   Acute hypoxic respiratory failure. - combination of upper respiratory infection, aspiration, and COPD/asthma - goal SpO2 > 90%  Upper respiratory infection. - check respiratory viral panel  Aspiration pneumonitis. - D3 diet - f/u with speech therapy - repeat CXR 6/15 - ABx per primary team - continue hypertonic saline nebulizer, flutter valve, mucinex - mobilize as tolerated  COPD with asthma. - continue duoneb  Chronic diastolic CHF, Chronic A fib. - monitor fluid balance  Hx of CVA with dementia and depression. - DNR/DNI  Best practice:  Diet: D3 diet DVT prophylaxis: Coumadin GI prophylaxis: Not indicated Mobility: As tolerated Code Status: DNR/DNI Family Communication: updated family at bedside  D/w Dr. Bonner Puna  Labs    CMP Latest Ref Rng & Units 07/25/2019 07/24/2019 07/23/2019  Glucose 70 - 99 mg/dL 126(H) 106(H) 96  BUN 8 - 23 mg/dL 17 13 15   Creatinine 0.44 - 1.00 mg/dL 0.71 0.69 0.85  Sodium 135 - 145 mmol/L 142 140 145  Potassium 3.5 - 5.1 mmol/L 3.1(L) 4.0 3.4(L)  Chloride 98 - 111 mmol/L 103 101 98  CO2 22 - 32 mmol/L 28 29 31   Calcium 8.9 - 10.3 mg/dL 8.5(L) 8.4(L)  9.5  Total Protein 6.5 - 8.1 g/dL - - 7.6  Total Bilirubin 0.3 - 1.2 mg/dL - - 1.2  Alkaline Phos 38 - 126 U/L - - 64  AST 15 - 41 U/L - - 60(H)  ALT 0 - 44 U/L - - 24    CBC Latest Ref Rng & Units 07/24/2019 07/23/2019 04/29/2019  WBC 4.0 - 10.5 K/uL 9.8 8.5 8.2  Hemoglobin 12.0 - 15.0 g/dL 12.8 13.4 12.3  Hematocrit 36 - 46 % 40.4 42.5 38.6  Platelets 150 - 400 K/uL 174 176 195    Signature:  Chesley Mires, MD [facility] Pager - [phone] 07/25/2019, 10:17 AM

## 2019-07-25 NOTE — Evaluation (Signed)
Occupational Therapy Evaluation Patient Details Name: Amber [name] MRN: [id number] DOB: 21-Jun-1934 Today's Date: 07/25/2019    History of Present Illness 85 y.o. female with a history of chronic AFib on coumadin, stage IIIa CKD, COPD with emphysema, CAD, chronic HFpEF, HTN, hypothyroidism, GERD, osteoarthritis and fibromyalgia, moderate dementia, CVA with left weakness, fall down stairs in March 2021 resulting in left rib fx and T2, 6,9 compression fractures. Pt presented from home due to several days of increasing wet cough and dyspnea. On evaluation she was afebrile with hypoxia.  Pt admitted for Acute hypoxemic respiratory failure due to aspiration pneumonia, pneumonitis and tracheobronchitis on COPD without acute exacerbation   Clinical Impression   Pt admitted with the above. Pt currently with functional limitations due to the deficits listed below (see OT Problem List).  Pt will benefit from skilled OT to increase their safety and independence with ADL and functional mobility for ADL to facilitate discharge to venue listed below.      Follow Up Recommendations  Home health OT;Other (comment)    Equipment Recommendations  None recommended by OT    Recommendations for Other Services       Precautions / Restrictions Precautions Precautions: Fall Precaution Comments: L AFO, hx of L deficits since CVA      Mobility Bed Mobility Overal bed mobility: Needs Assistance Bed Mobility: Supine to Sit     Supine to sit: Min assist;HOB elevated     General bed mobility comments: assist for trunk upright  Transfers Overall transfer level: Needs assistance Equipment used: Rolling walker (2 wheeled) Transfers: Sit to/from [facility] Sit to Stand: Mod assist;+2 physical assistance Stand pivot transfers: Min assist;+2 physical assistance       General transfer comment: verbal cues for safe technique, assist to rise, steady and pivot to [facility]; improved ability to  assist from [facility] (minassist)    Balance Overall balance assessment: Needs assistance         Standing balance support: Bilateral upper extremity supported Standing balance-Leahy Scale: Poor                             ADL either performed or assessed with clinical judgement   ADL Overall ADL's : Needs assistance/impaired Eating/Feeding: Set up;Sitting   Grooming: Set up;Sitting   Upper Body Bathing: Minimal assistance;Sitting   Lower Body Bathing: Moderate assistance;Sit to/from stand;Cueing for safety;Cueing for compensatory techniques   Upper Body Dressing : Minimal assistance;Sitting   Lower Body Dressing: Moderate assistance;Sit to/from stand;Cueing for safety   Toilet Transfer: Minimal assistance;RW;Cueing for safety;Cueing for sequencing;Stand-pivot   Toileting- Clothing Manipulation and Hygiene: Moderate assistance;Sit to/from stand;Cueing for safety;Cueing for compensatory techniques         General ADL Comments: Spoke with daugther regardingneed for 24/7 A at home initially                  Pertinent Vitals/Pain Pain Assessment: No/denies pain     Hand Dominance Right   Extremity/Trunk Assessment Upper Extremity Assessment Upper Extremity Assessment: Generalized weakness   Lower Extremity Assessment Lower Extremity Assessment: Generalized weakness;LLE deficits/detail LLE Deficits / Details: L ankle in inversion (uses AFO)   Cervical / Trunk Assessment Cervical / Trunk Assessment: Other exceptions Cervical / Trunk Exceptions: hx of compression fractures   Communication Communication Communication: No difficulties   Cognition Arousal/Alertness: Awake/alert Behavior During Therapy: WFL for tasks assessed/performed Overall Cognitive Status: Impaired/Different from baseline  General Comments: seems a little confused and daughter reports cognition not at baseline, however pt following simple  commands and participating as able              [place] expects to be discharged to:: Private residence Living Arrangements: Spouse/significant other Available Help at Discharge: Family;Available PRN/intermittently Type of Home: House Home Access: Ramped entrance     Home Layout: One level     Bathroom Shower/Tub: Occupational psychologist: Handicapped height     Home Equipment: Environmental consultant - 4 wheels;Bedside commode;Tub bench   Additional Comments: daughter's work, but spouse is home with pt.  Daughter's provide night time assist and PRN support during the day       Prior Functioning/Environment Level of Independence: Needs assistance  Gait / Transfers Assistance Needed: rollator for gait ADL's / Homemaking Assistance Needed: pt has supervision to get into/out of shower but bathes and dresses with supervision. Family does the homemaking. Daughters spend the night            OT Problem List: Decreased strength;Decreased activity tolerance;Decreased safety awareness;Impaired balance (sitting and/or standing)      OT Treatment/Interventions: Self-care/ADL training;Patient/family education;DME and/or AE instruction    OT Goals(Current goals can be found in the care plan section) Acute Rehab OT Goals Patient Stated Goal: home OT Goal Formulation: With patient Time For Goal Achievement: 08/01/19 ADL Goals Pt Will Perform Grooming: with supervision;standing Pt Will Transfer to Toilet: with supervision;regular height toilet Pt Will Perform Toileting - Clothing Manipulation and hygiene: with supervision;sit to/from stand  OT Frequency: Min 2X/week    AM-PAC OT "6 Clicks" Daily Activity     Outcome Measure Help from another person eating meals?: None Help from another person taking care of personal grooming?: None Help from another person toileting, which includes using toliet, bedpan, or urinal?: A Lot Help from another person bathing (including  washing, rinsing, drying)?: A Lot Help from another person to put on and taking off regular upper body clothing?: A Little Help from another person to put on and taking off regular lower body clothing?: A Lot 6 Click Score: 17   End of Session Equipment Utilized During Treatment: Gait belt;Rolling walker Nurse Communication: Mobility status  Activity Tolerance: Patient tolerated treatment well Patient left: in chair;with call bell/phone within reach;with family/visitor present  OT Visit Diagnosis: Unsteadiness on feet (R26.81);Other abnormalities of gait and mobility (R26.89);Muscle weakness (generalized) (M62.81)                Time: 7035-0093 OT Time Calculation (min): 23 min Charges:  OT General Charges $OT Visit: 1 Visit OT Evaluation $OT Eval Moderate Complexity: 1 Mod OT Treatments $Self Care/Home Management : 8-22 mins  Kari Baars, OT Acute Rehabilitation Services [phone] Office- [phone], Edwena Felty D 07/25/2019, 7:20 PM

## 2019-07-25 NOTE — TOC Progression Note (Signed)
Transition of Care [facility]) - Progression Note    Patient Details  Name: Amber [name] MRN: [id number] Date of Birth: 07-Jul-1934  Transition of Care [facility]) CM/SW Contact  Purcell Mouton, RN Phone Number: 07/25/2019, 4:16 PM  Clinical Narrative:    PT recommendation SNF. Will follow up with pt's daughter. A call was made to daughter which was unsuccessful will try again.    Expected Discharge Plan: [place] Barriers to Discharge: No Barriers Identified  Expected Discharge Plan and Services Expected Discharge Plan: [place] Choice: [place] arrangements for the past 2 months: Single Family Home                                       Social Determinants of Health (SDOH) Interventions    Readmission Risk Interventions No flowsheet data found.

## 2019-07-25 NOTE — Progress Notes (Addendum)
Modified Barium Swallow Progress Note  Patient Details  Name: Amber [name] MRN: [id number] Date of Birth: January 25, 1935  Today's Date: 07/25/2019  Modified Barium Swallow completed.  Full report located under Chart Review in the Imaging Section.  Brief recommendations include the following:  Clinical Impression  Mild pharyngo-cervical esophageal dysphagia characterized by prominent cricopharyngeus (See Rf 8, 86/170) with appearance of widened esophagus below did not trap barium at this time.    Radiologist not present to confirm.  Fortunately nearly 100% of barium flowed through this area - including tablet, pudding and solids.  But would advise to monitor for CP dysfunction impacts closely.  Pt did not report symptoms of dysphagia during testing. She did produce cough x2 once prior to [facility] and 2nd time during [facility]- no aspiration or penetration of barium observed.   SLP would advise pt to monitor however to assure tolerating well.  No aspiration or penetration of any consistency tested noted.    Pt is noted to become dyspenic and thus her swallow/respiration reciprocity may be compromised at times allowing episodic aspiration.  Due to weakness and dyspnea, recommend advance diet to dys3/thin with strict precautions.   Swallow Evaluation Recommendations       SLP Diet Recommendations: Dysphagia 3 (Mech soft) solids;Thin liquid    Start intake with liquids   Medication Administration:  (as tolerated, if large crush if not contraindicated)   Supervision: Patient able to self feed   Compensations: Minimize environmental distractions;Slow rate;Small sips/bites   Postural Changes: Remain semi-upright after after feeds/meals (Comment);Seated upright at 90 degrees   Oral Care Recommendations: Oral care before and after PO   Other Recommendations: Have oral suction available   Kathleen Lime, MS [facility] SLP Acute Rehab Services Office [phone]  [name] 07/25/2019,10:07 AM

## 2019-07-25 NOTE — Evaluation (Signed)
Physical Therapy Evaluation Patient Details Name: Amber [name] MRN: [id number] DOB: 30-Jan-1935 Today's Date: 07/25/2019   History of Present Illness  85 y.o. female with a history of chronic AFib on coumadin, stage IIIa CKD, COPD with emphysema, CAD, chronic HFpEF, HTN, hypothyroidism, GERD, osteoarthritis and fibromyalgia, moderate dementia, CVA with left weakness, fall down stairs in March 2021 resulting in left rib fx and T2, 6,9 compression fractures. Pt presented from home due to several days of increasing wet cough and dyspnea. On evaluation she was afebrile with hypoxia.  Pt admitted for Acute hypoxemic respiratory failure due to aspiration pneumonia, pneumonitis and tracheobronchitis on COPD without acute exacerbation  Clinical Impression  Pt admitted with above diagnosis.  Pt currently with functional limitations due to the deficits listed below (see PT Problem List). Pt will benefit from skilled PT to increase their independence and safety with mobility to allow discharge to the venue listed below.   Pt assisted to [facility] and then ambulated short distance in hallway. Pt requiring at least mod assist for mobility at this time.  Daughter present and to discuss d/c plan with pt's other daughter Investment banker, corporate).  Pt may need SNF upon d/c if assist isn't available at home.     Follow Up Recommendations SNF;Supervision/Assistance - 24 hour    Equipment Recommendations  None recommended by PT    Recommendations for Other Services       Precautions / Restrictions Precautions Precautions: Fall Precaution Comments: L AFO, hx of L deficits since CVA      Mobility  Bed Mobility Overal bed mobility: Needs Assistance Bed Mobility: Supine to Sit     Supine to sit: Min assist;HOB elevated     General bed mobility comments: assist for trunk upright  Transfers Overall transfer level: Needs assistance Equipment used: Rolling walker (2 wheeled) Transfers: Sit to/from [employer] Sit to Stand: Mod assist;+2 physical assistance Stand pivot transfers: Min assist;+2 physical assistance       General transfer comment: verbal cues for safe technique, assist to rise, steady and pivot to [facility]; improved ability to assist from [facility] (minassist)  Ambulation/Gait Ambulation/Gait assistance: Min assist;+2 safety/equipment Gait Distance (Feet): 35 Feet Assistive device: Rolling walker (2 wheeled) Gait Pattern/deviations: Step-through pattern;Decreased stride length     General Gait Details: pt with occasional steady assist due to L ankle instability (even with AFO), SPO2 90-96% on room air  Stairs            Wheelchair Mobility    Modified Rankin (Stroke Patients Only)       Balance Overall balance assessment: Needs assistance         Standing balance support: Bilateral upper extremity supported Standing balance-Leahy Scale: Poor                               Pertinent Vitals/Pain Pain Assessment: No/denies pain    Home Living Family/patient expects to be discharged to:: Private residence Living Arrangements: Spouse/significant other Available Help at Discharge: Family;Available PRN/intermittently Type of Home: House Home Access: Ramped entrance     Home Layout: One level Home Equipment: [place] - 4 wheels;Bedside commode;Tub bench Additional Comments: daughter's work, but spouse is home with pt.  Daughter's provide night time assist and PRN support during the day     Prior Function Level of Independence: Needs assistance   Gait / Transfers Assistance Needed: rollator for gait  ADL's / Homemaking Assistance Needed: pt has  supervision to get into/out of shower but bathes and dresses with supervision. Family does the homemaking. Daughters spend the night        Hand Dominance        Extremity/Trunk Assessment        Lower Extremity Assessment Lower Extremity Assessment: Generalized weakness;LLE  deficits/detail LLE Deficits / Details: L ankle in inversion (uses AFO)    Cervical / Trunk Assessment Cervical / Trunk Assessment: Other exceptions Cervical / Trunk Exceptions: hx of compression fractures  Communication   Communication: No difficulties  Cognition Arousal/Alertness: Awake/alert Behavior During Therapy: WFL for tasks assessed/performed Overall Cognitive Status: Impaired/Different from baseline                                 General Comments: seems a little confused and daughter reports cognition not at baseline, however pt following simple commands and participating as able      General Comments      Exercises     Assessment/Plan    PT Assessment Patient needs continued PT services  PT Problem List Decreased balance;Decreased activity tolerance;Decreased strength;Decreased mobility;Decreased knowledge of use of DME;Cardiopulmonary status limiting activity       PT Treatment Interventions DME instruction;Therapeutic activities;Gait training;Therapeutic exercise;Patient/family education;Functional mobility training;Balance training;Wheelchair mobility training    PT Goals (Current goals can be found in the Care Plan section)  Acute Rehab PT Goals PT Goal Formulation: With patient/family Time For Goal Achievement: 08/08/19 Potential to Achieve Goals: Good    Frequency Min 3X/week   Barriers to discharge        Co-evaluation               AM-PAC PT "6 Clicks" Mobility  Outcome Measure Help needed turning from your back to your side while in a flat bed without using bedrails?: A Little Help needed moving from lying on your back to sitting on the side of a flat bed without using bedrails?: A Lot Help needed moving to and from a bed to a chair (including a wheelchair)?: A Lot Help needed standing up from a chair using your arms (e.g., wheelchair or bedside chair)?: A Lot Help needed to walk in hospital room?: A Little Help needed  climbing 3-5 steps with a railing? : A Lot 6 Click Score: 14    End of Session Equipment Utilized During Treatment: Gait belt Activity Tolerance: Patient limited by fatigue Patient left: in chair;with call bell/phone within reach;with chair alarm set;with family/visitor present Nurse Communication: Mobility status PT Visit Diagnosis: Other abnormalities of gait and mobility (R26.89)    Time: 2820-6015 PT Time Calculation (min) (ACUTE ONLY): 22 min   Charges:   PT Evaluation $PT Eval Low Complexity: 1 Low     Kati PT, DPT Acute Rehabilitation Services Pager: [phone] Office: [phone]  York Ram E 07/25/2019, 4:05 PM

## 2019-07-25 NOTE — Progress Notes (Signed)
PROGRESS NOTE  TANARA [name]  QQV:[id number] DOB: 02-Jul-1934 DOA: 07/23/2019 PCP: Antony Contras, MD   Brief Narrative: Amber [name] is an 85 y.o. female with a history of remote CVA, chronic AFib on coumadin, stage IIIa CKD, COPD with emphysema, CAD, chronic HFpEF, HTN, hypothyroidism, GERD, osteoarthritis and fibromyalgia, moderate dementia who presented from home due to several days of increasing wet cough and dyspnea. On evaluation she was afebrile with hypoxia. WBC normal. CT chest revealed significant debris and fluid collections in airways, suspected mucous plugging. She has recently had sounds suggestive of choking with pills and liquids, and in this setting, aspiration pneumonia was suspected. Antibiotics were started, SLP evaluation requested, pulmonology consulted. Modified barium swallow study showed no significant dysphagia and the patient's husband has developed URI symptoms. Respiratory viral panel is ordered.  Assessment & Plan: Active Problems:   Hypoxia  Acute hypoxemic respiratory failure due to aspiration pneumonia, pneumonitis and tracheobronchitis on COPD without acute exacerbation: - Wean O2 as tolerated, OOB to chair today, PT/OT evaluations ordered. IS/FV. - MBSS 6/14 showed limited evidence of aspiration, dysphagia 3 diet recommended. - Continue IV unasyn and azithromycin for now. Monitor blood cultures (NGTD) and sputum cultures (reincubated, few GNR, rare GPC in pairs).  - Appreciate pulmonology assistance with management. Recommending chest PT as this has been effective in the past. Pt has osteoporosis and chronic-appearing compression fractures, so will monitor for tolerance. Bronchoscopy is last resort - Hypertonic saline BID, can give duonebs as well with hx COPD - With +URI in family contacts, development of rhonchi, ?RSV bronchiolitis, RVP has been sent. - Repeat CXR in AM.  Chronic HFpEF, HTN: Compensated currently though BNP is elevated this is chronic.  -  Will continue home cardiac medications. DC IVF now that able to take po. Does not appear to be overloaded.  Chronic atrial fibrillation:  - Rate is controlled on metoprolol - Continue daily INR checks per policy, continue coumadin  CAD: No chest pain.  - Continue beta blocker, plavix. Note statin is on allergy list as is zetia.  Gout:  - Restart allopurinol  Dementia with depression:  - Delirium precautions, would prefer that family be allowed unrestricted visitation to assist in avoidance of harm to patient due to sundowning. - Can restart aricept, namenda and continue SSRI  Hypokalemia:  - Supplemented in IVF preferred over PO due to esophagitis risk.  History of UTI: No pyuria on urinalysis.   Hypothyroidism:  - Continue synthroid  DVT prophylaxis: Coumadin Code Status: DNR Family Communication: Daughter at bedside Disposition Plan:  Status is: Inpatient  Remains inpatient appropriate because:Altered mental status, Ongoing diagnostic testing needed not appropriate for outpatient work up and IV treatments appropriate due to intensity of illness or inability to take PO   Dispo: The patient is from: Home              Anticipated d/c is to: TBD              Anticipated d/c date is: 2 days              Patient currently is not medically stable to d/c.  Consultants:   Pulmonology  Procedures:   None  Antimicrobials:  Unasyn, azithromycin 6/12 >>    Subjective: Confused, sounded better this morning, now having some wheezing/rhonchi. Sleeping more than usual.   Objective: Vitals:   07/25/19 0223 07/25/19 0506 07/25/19 0757 07/25/19 1246  BP:  101/70  135/76  Pulse:  72  65  Resp:  18  16  Temp:  98.6 F (37 C)  98.3 F (36.8 C)  TempSrc:  Axillary  Oral  SpO2: 93% 92% 97% 98%  Weight:      Height:        Intake/Output Summary (Last 24 hours) at 07/25/2019 1454 Last data filed at 07/25/2019 1100 Gross per 24 hour  Intake 1467.73 ml  Output 900 ml   Net 567.73 ml   Filed Weights   07/23/19 1627  Weight: 54.4 kg   Gen: Elderly female in no distress Pulm: Non-labored with upper airway transmitted sounds as well as suspected diffuse rhonchi.  CV: Irreg irreg. No murmur, rub, or gallop. No JVD, no pedal edema. GI: Abdomen soft, non-tender, non-distended, with normoactive bowel sounds. No organomegaly or masses felt. Ext: Warm, no deformities Skin: No rashes, lesions or ulcers on visualized skin Neuro: Alert and disoriented. Moves all extremities. Psych: Judgement and insight appear impaired. Mood & affect appropriate.   Data Reviewed: I have personally reviewed following labs and imaging studies  CBC: Recent Labs  Lab 07/23/19 2017 07/24/19 0411  WBC 8.5 9.8  NEUTROABS 5.9  --   HGB 13.4 12.8  HCT 42.5 40.4  MCV 94.4 94.2  PLT 176 542   Basic Metabolic Panel: Recent Labs  Lab 07/23/19 2017 07/24/19 0411 07/25/19 0426  NA 145 140 142  K 3.4* 4.0 3.1*  CL 98 101 103  CO2 31 29 28   GLUCOSE 96 106* 126*  BUN 15 13 17   CREATININE 0.85 0.69 0.71  CALCIUM 9.5 8.4* 8.5*   GFR: Estimated Creatinine Clearance: 46.6 mL/min (by C-G formula based on SCr of 0.71 mg/dL). Liver Function Tests: Recent Labs  Lab 07/23/19 2017  AST 60*  ALT 24  ALKPHOS 64  BILITOT 1.2  PROT 7.6  ALBUMIN 4.7   No results for input(s): LIPASE, AMYLASE in the last 168 hours. No results for input(s): AMMONIA in the last 168 hours. Coagulation Profile: Recent Labs  Lab 07/23/19 2017 07/24/19 0411 07/25/19 0426  INR 2.2* 2.2* 2.3*   Cardiac Enzymes: No results for input(s): CKTOTAL, CKMB, CKMBINDEX, TROPONINI in the last 168 hours. BNP (last 3 results) No results for input(s): PROBNP in the last 8760 hours. HbA1C: No results for input(s): HGBA1C in the last 72 hours. CBG: No results for input(s): GLUCAP in the last 168 hours. Lipid Profile: No results for input(s): CHOL, HDL, LDLCALC, TRIG, CHOLHDL, LDLDIRECT in the last 72  hours. Thyroid Function Tests: No results for input(s): TSH, T4TOTAL, FREET4, T3FREE, THYROIDAB in the last 72 hours. Anemia Panel: No results for input(s): VITAMINB12, FOLATE, FERRITIN, TIBC, IRON, RETICCTPCT in the last 72 hours. Urine analysis:    Component Value Date/Time   COLORURINE YELLOW 07/23/2019 2137   APPEARANCEUR CLEAR 07/23/2019 2137   LABSPEC 1.014 07/23/2019 2137   PHURINE 6.0 07/23/2019 2137   GLUCOSEU NEGATIVE 07/23/2019 2137   GLUCOSEU NEGATIVE 02/28/2013 1235   HGBUR NEGATIVE 07/23/2019 2137   BILIRUBINUR NEGATIVE 07/23/2019 2137   KETONESUR NEGATIVE 07/23/2019 2137   PROTEINUR NEGATIVE 07/23/2019 2137   UROBILINOGEN 0.2 02/28/2013 1235   NITRITE NEGATIVE 07/23/2019 2137   LEUKOCYTESUR SMALL (A) 07/23/2019 2137   Recent Results (from the past 240 hour(s))  SARS Coronavirus 2 by RT PCR (hospital order, performed in [facility] hospital lab) Nasopharyngeal Nasopharyngeal Swab     Status: None   Collection Time: 07/23/19 11:36 PM   Specimen: Nasopharyngeal Swab  Result Value Ref Range Status   SARS  Coronavirus 2 NEGATIVE NEGATIVE Final    Comment: (NOTE) SARS-CoV-2 target nucleic acids are NOT DETECTED.  The SARS-CoV-2 RNA is generally detectable in upper and lower respiratory specimens during the acute phase of infection. The lowest concentration of SARS-CoV-2 viral copies this assay can detect is 250 copies / mL. A negative result does not preclude SARS-CoV-2 infection and should not be used as the sole basis for treatment or other patient management decisions.  A negative result may occur with improper specimen collection / handling, submission of specimen other than nasopharyngeal swab, presence of viral mutation(s) within the areas targeted by this assay, and inadequate number of viral copies (<250 copies / mL). A negative result must be combined with clinical observations, patient history, and epidemiological information.  Fact Sheet for Patients:    StrictlyIdeas.no  Fact Sheet for Healthcare Providers: BankingDealers.co.za  This test is not yet approved or  cleared by the [place] FDA and has been authorized for detection and/or diagnosis of SARS-CoV-2 by FDA under an Emergency Use Authorization (EUA).  This EUA will remain in effect (meaning this test can be used) for the duration of the COVID-19 declaration under Section 564(b)(1) of the Act, 21 U.S.C. section 360bbb-3(b)(1), unless the authorization is terminated or revoked sooner.  Performed at [facility], [place] [address] [place], [place] [address]   Culture, blood (routine x 2)     Status: None (Preliminary result)   Collection Time: 07/24/19  3:59 AM   Specimen: BLOOD  Result Value Ref Range Status   Specimen Description   Final    BLOOD LEFT ANTECUBITAL Performed at [place] [address], [place], [place] [address]    Special Requests   Final    BOTTLES DRAWN AEROBIC ONLY Blood Culture adequate volume Performed at [place] [address], [place], [place] [address]    Culture   Final    NO GROWTH 1 DAY Performed at [facility], [place] [address], [place], [place] [address]    Report Status PENDING  Incomplete  Culture, blood (routine x 2)     Status: None (Preliminary result)   Collection Time: 07/24/19  4:11 AM   Specimen: Right Antecubital; Blood  Result Value Ref Range Status   Specimen Description   Final    RIGHT ANTECUBITAL Performed at [place] [address], [place], [place] [address]    Special Requests   Final    BACTERIAL CASTS Blood Culture adequate volume Performed at [place] [address] [place], [place] [address]    Culture   Final    NO GROWTH < 24 HOURS Performed at [place] [address], [place], [place] [address]    Report Status PENDING   Incomplete  Expectorated sputum assessment w rflx to resp cult     Status: None   Collection Time: 07/24/19 11:45 AM   Specimen: Sputum  Result Value Ref Range Status   Specimen Description SPUTUM  Final   Special Requests EXPECTORANT  Final   Sputum evaluation   Final    THIS SPECIMEN IS ACCEPTABLE FOR SPUTUM CULTURE Performed at [facility], [place] [address], [place], [place] [address]    Report Status 07/24/2019 FINAL  Final  Culture, respiratory     Status: None (Preliminary result)   Collection Time: 07/24/19 11:45 AM   Specimen: SPU  Result Value Ref Range Status   Specimen Description  Final    SPUTUM Performed at [facility], [place] [address], [place], [place] [address]    Special Requests   Final    EXPECTORANT Reflexed from [id number] Performed at [facility], [place] [address], [place], [place] [address]    Gram Stain   Final    RARE WBC PRESENT, PREDOMINANTLY PMN FEW GRAM NEGATIVE RODS RARE GRAM POSITIVE COCCI IN PAIRS    Culture   Final    CULTURE REINCUBATED FOR BETTER GROWTH Performed at [facility], [place] [address], [place], [place] [address]    Report Status PENDING  Incomplete      Radiology Studies: DG Chest 2 View  Result Date: 07/23/2019 CLINICAL DATA:  Cough and shortness of breath EXAM: CHEST - 2 VIEW COMPARISON:  04/30/2019 FINDINGS: Post median sternotomy for CABG. Cardiomediastinal contours are stable enlarged. Persistent blunting of the LEFT costodiaphragmatic angle. Minimal LEFT basilar atelectasis. Lungs are otherwise clear. Visualized skeletal structures to the extent evaluated are unremarkable. IMPRESSION: Persistent blunting of the LEFT costodiaphragmatic angle, likely small effusion and/or atelectasis. Electronically Signed   By: Zetta Bills M.D.   On: 07/23/2019 17:20   CT Angio Chest PE W/Cm &/Or Wo Cm  Result Date: 07/23/2019 CLINICAL DATA:  Cough, shortness of breath, fatigue and  weakness for 4 days. EXAM: CT ANGIOGRAPHY CHEST WITH CONTRAST TECHNIQUE: Multidetector CT imaging of the chest was performed using the standard protocol during bolus administration of intravenous contrast. Multiplanar CT image reconstructions and MIPs were obtained to evaluate the vascular anatomy. CONTRAST:  138mL OMNIPAQUE IOHEXOL 350 MG/ML SOLN COMPARISON:  Radiograph earlier this day.  Chest CT 04/28/2019 FINDINGS: Cardiovascular: There are no filling defects within the pulmonary arteries to suggest pulmonary embolus. Thoracic aortic atherosclerosis. Post CABG with mild cardiomegaly and calcification of native coronary arteries. Contrast refluxes into the hepatic veins and IVC. Mediastinum/Nodes: Patulous esophagus. No enlarged mediastinal or hilar lymph nodes. No visualized thyroid nodules. Lungs/Pleura: Significant debris within the mid trachea with fluid level, with debris extending into the left greater than right mainstem bronchi resultant and mild luminal narrowing. There is short segment complete occlusion of left lower lobe bronchus with distal bronchial filling and mucoid impaction. Bronchial filling in the right lower lobe to a lesser extent. Trace left pleural effusion. Minimal subpleural irregular opacity in the right upper lobe is stable from prior exam and likely scarring. Upper Abdomen: Reflux of contrast into the hepatic veins and IVC. Upper abdominal atherosclerosis. Musculoskeletal: Post median sternotomy. There are multiple chronic compression fractures throughout the thoracic spine, unchanged from prior. Healing left rib fractures. Review of the MIP images confirms the above findings. IMPRESSION: 1. No pulmonary embolus. 2. Significant debris within the mid trachea with fluid level, with debris extending into the left greater than right mainstem bronchi. There is short segment complete occlusion of left lower lobe bronchus with distal bronchial filling and mucoid impaction. Findings may be  due to aspiration or mucous plugging. 3. Trace left pleural effusion. 4. Minimal peripheral opacity in the right upper lobe is unchanged from prior exam and suggestive of scarring. 5. Reflux of contrast into the hepatic veins and IVC consistent with elevated right heart pressures. Aortic Atherosclerosis (ICD10-I70.0). Electronically Signed   By: Keith Rake M.D.   On: 07/23/2019 22:51   DG Swallowing Func-Speech Pathology  Result Date: 07/25/2019 Objective Swallowing Evaluation: Type of Study: MBS-Modified Barium Swallow Study  Patient Details Name: EVELIA WASKEY MRN: [id number] Date of Birth: [birth year] Today's  Date: 07/25/2019 Time: SLP Start Time (ACUTE ONLY): 6644 -SLP Stop Time (ACUTE ONLY): 0855 SLP Time Calculation (min) (ACUTE ONLY): 23 min Past Medical History: Past Medical History: Diagnosis Date . Abnormality of gait 08/02/2014 . Cerebrovascular disease 05/21/2016 . Cervical spinal stenosis  . Chronic atrial fibrillation (HCC)   a. chronic/rate controlled;  b. chronic coumadin. . Chronic diastolic CHF (congestive heart failure) ([place])   a. 02/2013 Echo: EF 55-60%, mild AI, mod dil LA. . CKD (chronic kidney disease), stage III  . COPD with emphysema ([place])   PFT 05/02/10>>FEV1 1.35(62%), FEV1% 66, DLCO 75% . Coronary artery disease   a. s/p CABG;  b. Abnormal nuc 2015 - managed medically. . CVA (cerebral vascular accident) ([place])   left sided weakness . Depression  . Fibromyalgia  . GERD (gastroesophageal reflux disease)   pepcid 2-3 times per week . Gout  . Hemiparesis and alteration of sensations as late effects of stroke ([place]) 01/17/2015 . History of melanoma   squamous cell, melanoma . Hyperlipidemia   a. statin intolerant, not felt to be candidate for PCSK9 due to chronically elevated CK levels. . Hypertension  . Hypertensive heart disease  . Hypothyroidism  . Insomnia  . Memory change 01/23/2014 . Nasal polyposis  . Osteoarthritis  . Osteoporosis  . Pneumonia   1990 . Tobacco abuse  Past Surgical  History: Past Surgical History: Procedure Laterality Date . ABDOMINAL HYSTERECTOMY   . BREAST LUMPECTOMY  1980s  Benign lesion - right . carpel tunnel    right . CORONARY ARTERY BYPASS GRAFT  2000 . EYE SURGERY    bilateral cataracts . IR GENERIC HISTORICAL  03/31/2016  IR RADIOLOGIST EVAL & MGMT 03/31/2016 [facility]-INTERV RAD . RADIOLOGY WITH ANESTHESIA  12/24/2011  Procedure: RADIOLOGY WITH ANESTHESIA;  Surgeon: Medication Radiologist, MD;  Location: [facility];  Service: Radiology;  Laterality: N/A;  Extra Cranial Vascular Stent . RADIOLOGY WITH ANESTHESIA N/A 07/19/2014  Procedure: ANGIOPLASTY;  Surgeon: Luanne Bras, MD;  Location: [place] [facility];  Service: Radiology;  Laterality: N/A; . RADIOLOGY WITH ANESTHESIA N/A 07/27/2014  Procedure: ANGIOPLASTY;  Surgeon: Luanne Bras, MD;  Location: [place];  Service: Radiology;  Laterality: N/A; . TONSILLECTOMY   HPI: Jamey Demchak  is a 85 y.o. female with a known history of dementia, coronary artery disease, CVA, COPD, hypertension, hypothyroidism, chronic diastolic CHF and dyslipidemia, who presented to the emergency room with acute onset of recent worsening cough with associated dyspnea and inability to expectorate.  Chest CT concerning for aspiration PNA vs mucous plugging.  Pt completed an MBS on 02/17/2017 with recommendations for regular solids and thin liquids.  Subjective: Pt was alert Assessment / Plan / Recommendation CHL IP CLINICAL IMPRESSIONS 07/25/2019 Clinical Impression Patient presents with minimal oral dysphagia resulting in minimal delay in oral transiting with intact pharyngeal swallow.  Mild pharyngo-cervical esophageal dysphagia characterized by prominent cricopharyngeus (See Rf 8, 86/170) with appearance of widened esophagus below did not trap barium at this time.  Radiologist not present to confirm.  Fortunately nearly 100% of barium flowed through this area - including tablet, pudding and solids.  But would advise to monitor for CP dysfunction impacts closely.   Pt did not report symptoms of dysphagia during testing. She did produce cough x2 once prior to [facility] and 2nd time during MBS- no aspiration or penetration of barium observed. SLP would advise pt to monitor however to assure tolerating well.  No aspiration or penetration of any consistency tested noted.  Pt is noted to become dyspenic and  thus her swallow/respiration reciprocity may be compromised at times allowing episodic aspiration.  Due to weakness and dyspnea, recommend advance diet to dys3/thin with strict precautions. SLP Visit Diagnosis Dysphagia, pharyngoesophageal phase (R13.14);Dysphagia, oral phase (R13.11) Attention and concentration deficit following -- Frontal lobe and executive function deficit following -- Impact on safety and function Moderate aspiration risk   CHL IP TREATMENT RECOMMENDATION 07/24/2019 Treatment Recommendations Defer until completion of intrumental exam   Prognosis 07/25/2019 Prognosis for Safe Diet Advancement Fair Barriers to Reach Goals Cognitive deficits Barriers/Prognosis Comment -- CHL IP DIET RECOMMENDATION 07/25/2019 SLP Diet Recommendations Dysphagia 3 (Mech soft) solids;Thin liquid Liquid Administration via -- Medication Administration (No Data) Compensations Minimize environmental distractions;Slow rate;Small sips/bites Postural Changes Remain semi-upright after after feeds/meals (Comment);Seated upright at 90 degrees   CHL IP OTHER RECOMMENDATIONS 07/25/2019 Recommended Consults -- Oral Care Recommendations Oral care before and after PO Other Recommendations Have oral suction available   CHL IP FOLLOW UP RECOMMENDATIONS 07/25/2019 Follow up Recommendations Skilled Nursing facility;24 hour supervision/assistance   CHL IP FREQUENCY AND DURATION 07/25/2019 Speech Therapy Frequency (ACUTE ONLY) min 2x/week Treatment Duration 2 weeks      CHL IP ORAL PHASE 07/25/2019 Oral Phase WFL Oral - Pudding Teaspoon -- Oral - Pudding Cup -- Oral - Honey Teaspoon -- Oral - Honey Cup -- Oral -  Nectar Teaspoon -- Oral - Nectar Cup WFL Oral - Nectar Straw -- Oral - Thin Teaspoon WFL Oral - Thin Cup WFL;Piecemeal swallowing Oral - Thin Straw WFL Oral - Puree WFL Oral - Mech Soft Delayed oral transit;Premature spillage;WFL Oral - Regular -- Oral - Multi-Consistency -- Oral - Pill Other (Comment) Oral Phase - Comment --  CHL IP PHARYNGEAL PHASE 07/25/2019 Pharyngeal Phase Impaired Pharyngeal- Pudding Teaspoon -- Pharyngeal -- Pharyngeal- Pudding Cup -- Pharyngeal -- Pharyngeal- Honey Teaspoon -- Pharyngeal -- Pharyngeal- Honey Cup -- Pharyngeal -- Pharyngeal- Nectar Teaspoon -- Pharyngeal -- Pharyngeal- Nectar Cup WFL;Delayed swallow initiation-vallecula Pharyngeal Material does not enter airway Pharyngeal- Nectar Straw -- Pharyngeal -- Pharyngeal- Thin Teaspoon WFL Pharyngeal Material does not enter airway Pharyngeal- Thin Cup [facility] Pharyngeal Material does not enter airway Pharyngeal- Thin Straw WFL Pharyngeal Material does not enter airway Pharyngeal- Puree Delayed swallow initiation-vallecula Pharyngeal Material does not enter airway Pharyngeal- Mechanical Soft Delayed swallow initiation-vallecula Pharyngeal Material does not enter airway Pharyngeal- Regular -- Pharyngeal -- Pharyngeal- Multi-consistency -- Pharyngeal -- Pharyngeal- Pill WFL Pharyngeal Material does not enter airway Pharyngeal Comment --  CHL IP CERVICAL ESOPHAGEAL PHASE 07/25/2019 Cervical Esophageal Phase Impaired Pudding Teaspoon -- Pudding Cup -- Honey Teaspoon -- Honey Cup -- Nectar Teaspoon -- Nectar Cup Prominent cricopharyngeal segment Nectar Straw -- Thin Teaspoon Prominent cricopharyngeal segment Thin Cup Prominent cricopharyngeal segment Thin Straw Prominent cricopharyngeal segment Puree Prominent cricopharyngeal segment Mechanical Soft Prominent cricopharyngeal segment Regular -- Multi-consistency -- Pill Prominent cricopharyngeal segment Cervical Esophageal Comment Patient appears with prominent cricopharyngeus with widening of  esophagus below concerning for potential CP achalasia.  Radiologist not present to confirm.  Fortunately nearly 100% of barium flowed through this area - including tablet, pudding and solids.  But would advise to monitor for CP dysfunction impacts closely.  Pt did not report symptoms of dysphagia during testing. She did produce cough x2 once prior to [facility] and 2nd time during MBS- no aspiration or penetration of barium observed. Kathleen Lime, MS [facility] SLP Acute Rehab Services Office [phone] [name] 07/25/2019, 10:09 AM               Scheduled Meds: .  amLODipine  5 mg Oral QHS  . citalopram  20 mg Oral BID  . clopidogrel  75 mg Oral Daily  . fluticasone  1 spray Each Nare Daily  . furosemide  40 mg Intravenous Daily  . guaiFENesin  1,200 mg Oral BID  . ipratropium-albuterol  3 mL Nebulization TID  . levothyroxine  25 mcg Oral Q0600  . losartan  25 mg Oral Daily  . metoprolol succinate  25 mg Oral q morning - 10a  . sodium chloride HYPERTONIC  4 mL Nebulization BID  . warfarin  5 mg Oral q1600  . Warfarin - Physician Dosing Inpatient   Does not apply q1600   Continuous Infusions: . ampicillin-sulbactam (UNASYN) IV 1.5 g (07/25/19 1428)  . azithromycin 500 mg (07/25/19 0104)  . dextrose 5 % and 0.45 % NaCl with KCl 40 mEq/L 75 mL/hr at 07/25/19 0940     LOS: 2 days   Time spent: 25 minutes.  Patrecia Pour, MD [facility] [email or web address] 07/25/2019, 2:54 PM

## 2019-07-26 ENCOUNTER — Inpatient Hospital Stay (HOSPITAL_COMMUNITY): Payer: Medicare PPO

## 2019-07-26 DIAGNOSIS — B348 Other viral infections of unspecified site: Secondary | ICD-10-CM

## 2019-07-26 LAB — BASIC METABOLIC PANEL
Anion gap: 9 (ref 5–15)
BUN: 15 mg/dL (ref 8–23)
CO2: 26 mmol/L (ref 22–32)
Calcium: 8 mg/dL — ABNORMAL LOW (ref 8.9–10.3)
Chloride: 103 mmol/L (ref 98–111)
Creatinine, Ser: 0.62 mg/dL (ref 0.44–1.00)
GFR calc Af Amer: >60 mL/min (ref >60)
GFR calc non Af Amer: >60 mL/min (ref >60)
Glucose, Bld: 113 mg/dL — ABNORMAL HIGH (ref 70–99)
Potassium: 3.7 mmol/L (ref 3.5–5.1)
Sodium: 138 mmol/L (ref 135–145)

## 2019-07-26 LAB — CULTURE, RESPIRATORY W GRAM STAIN: Culture: NORMAL

## 2019-07-26 LAB — MISC LABCORP TEST (SEND OUT): Labcorp test code: 139650

## 2019-07-26 LAB — MAGNESIUM: Magnesium: 2.1 mg/dL (ref 1.7–2.4)

## 2019-07-26 LAB — PROTIME-INR
INR: 2 — ABNORMAL HIGH (ref 0.8–1.2)
Prothrombin Time: 21.9 seconds — ABNORMAL HIGH (ref 11.4–15.2)

## 2019-07-26 MED ORDER — POTASSIUM CHLORIDE 10 MEQ/100ML IV SOLN
INTRAVENOUS | Status: AC
  Filled 2019-07-26: qty 100

## 2019-07-26 MED ORDER — FUROSEMIDE 40 MG PO TABS
80.0000 mg | ORAL_TABLET | Freq: Every day | ORAL | Status: DC
  Administered 2019-07-26 – 2019-07-31 (×6): 80 mg via ORAL
  Filled 2019-07-26 (×6): qty 2

## 2019-07-26 MED ORDER — AMOXICILLIN-POT CLAVULANATE 500-125 MG PO TABS
1.0000 | ORAL_TABLET | Freq: Two times a day (BID) | ORAL | Status: AC
  Administered 2019-07-26 – 2019-07-28 (×5): 500 mg via ORAL
  Filled 2019-07-26 (×5): qty 1

## 2019-07-26 MED ORDER — BOOST / RESOURCE BREEZE PO LIQD CUSTOM
1.0000 | Freq: Two times a day (BID) | ORAL | Status: DC
  Administered 2019-07-26 – 2019-07-31 (×9): 1 via ORAL

## 2019-07-26 MED ORDER — AZITHROMYCIN 250 MG PO TABS: 500.0000 mg | ORAL_TABLET | Freq: Every day | ORAL | Status: DC

## 2019-07-26 NOTE — Progress Notes (Signed)
SATURATION QUALIFICATIONS: (This note is used to comply with regulatory documentation for home oxygen)  Patient Saturations on Room Air at  = 92%  Patient Saturations on Room Air while Ambulating = 86%  Patient Saturations on 2 Liters of oxygen while Ambulating = 95%  Please briefly explain why patient needs home oxygen:Pt desaturates with activity.

## 2019-07-26 NOTE — Progress Notes (Signed)
PROGRESS NOTE  Amber [name]  PZW:[id number] DOB: 1934-03-01 DOA: 07/23/2019 PCP: Antony Contras, MD   Brief Narrative: Amber [name] is an 85 y.o. female with a history of remote CVA, chronic AFib on coumadin, stage IIIa CKD, COPD with emphysema, CAD, chronic HFpEF, HTN, hypothyroidism, GERD, osteoarthritis and fibromyalgia, moderate dementia who presented from home due to several days of increasing wet cough and dyspnea. On evaluation she was afebrile with hypoxia. WBC normal. CT chest revealed significant debris and fluid collections in airways, suspected mucous plugging. She has recently had sounds suggestive of choking with pills and liquids, and in this setting, aspiration pneumonia was suspected. Antibiotics were started, SLP evaluation requested, pulmonology consulted. Modified barium swallow study showed no significant dysphagia and the patient's husband has developed URI symptoms. Respiratory viral panel revealed rhinovirus infection.  Assessment & Plan: Active Problems:   Hypoxia  Acute hypoxemic respiratory failure due to aspiration pneumonia, pneumonitis, rhinovirus infection, and tracheobronchitis on COPD without acute exacerbation: RVP +Rhinovirus. - Off O2 at rest, though not tolerating significant exertion to attempt exertional pulse oximetry. We will plan to perform that 6/16 prior to discharge.  - MBSS 6/14 showed limited evidence of aspiration, dysphagia 3 diet recommended, will liberalize to regular diet per family's wishes though they are fully aware of aspiration precautions/risk. - Complete 5 days of augmentin given her risk and reported history of aspiration. - Monitor blood cultures (NGTD) and sputum cultures (oral/pharyngeal flora).  - Appreciate pulmonology assistance, continue chest PT  - Hypertonic saline BID, can give duonebs as well with hx COPD - CXR personally reviewed this AM with no new infiltrates.  Chronic HFpEF, HTN: Compensated currently though BNP is  elevated this is chronic.  - Will continue home cardiac medications including lasix.  Chronic atrial fibrillation:  - Rate is controlled on metoprolol - Continue daily INR checks per policy, continue coumadin  CAD: No chest pain.  - Continue beta blocker, plavix. Note statin is on allergy list as is zetia.  Gout:  - Restart allopurinol  Dementia with depression:  - Delirium precautions, would prefer that family be allowed unrestricted visitation to assist in avoidance of harm to patient due to sundowning. - Can restart aricept, namenda and continue SSRI  Hypokalemia:  - Supplemented in IVF preferred over PO due to esophagitis risk. Has improved.  History of UTI: No pyuria on urinalysis.   Hypothyroidism:  - Continue synthroid  DVT prophylaxis: Coumadin Code Status: DNR Family Communication: Daughter at bedside Disposition Plan:  Status is: Inpatient  Remains inpatient appropriate because: the patient's current functional status would indicate she requires SNF rehabilitation. The family is planning on taking the patient home, so she will remain inpatient one more day to make arrangements at home, and for her to regain some strength/recover from infections (rhinovirus and aspiration)  Dispo: The patient is from: Home              Anticipated d/c is to: Home              Anticipated d/c date is: 6/16              Patient currently is not medically stable to d/c.  Consultants:   Pulmonology  Procedures:   None  Antimicrobials:  Unasyn, azithromycin 6/12 - 6/15  Augmentin 6/15 - 6/16   Subjective: Confused, thinks she recognizes me from skiing. Denies any shortness of breath or chest pain. She's eating ok, but would eat more if diet were liberalized. Pt's  husband has significant URI symptoms at home.  Objective: Vitals:   07/26/19 0637 07/26/19 0848 07/26/19 1411 07/26/19 1417  BP: (!) 148/77  (!) 156/56   Pulse: 74  66   Resp: 17  18   Temp: 98.8 F  (37.1 C)  98.7 F (37.1 C)   TempSrc: Oral     SpO2: 90% 99% 93% 93%  Weight:      Height:        Intake/Output Summary (Last 24 hours) at 07/26/2019 1724 Last data filed at 07/26/2019 5643 Gross per 24 hour  Intake 896.16 ml  Output 400 ml  Net 496.16 ml   Filed Weights   07/23/19 1627  Weight: 54.4 kg   Gen: Frail elderly female in no distress Pulm: Nonlabored breathing room air with considerable upper airway expiratory sounds, wet vocal quality improves with cough. CV: Regular rate and rhythm. No murmur, rub, or gallop. No JVD, no dependent edema. GI: Abdomen soft, non-tender, non-distended, with normoactive bowel sounds.  Ext: Warm, no deformities Skin: No new rashes, lesions or ulcers on visualized skin. Neuro: Alert and disoriented but pleasant and interactive. No new focal neurological deficits. Psych: Judgement and insight appear impaired. Mood euthymic & affect congruent. Behavior is appropriate.    Data Reviewed: I have personally reviewed following labs and imaging studies  CBC: Recent Labs  Lab 07/23/19 2017 07/24/19 0411  WBC 8.5 9.8  NEUTROABS 5.9  --   HGB 13.4 12.8  HCT 42.5 40.4  MCV 94.4 94.2  PLT 176 329   Basic Metabolic Panel: Recent Labs  Lab 07/23/19 2017 07/24/19 0411 07/25/19 0426 07/26/19 0430  NA 145 140 142 138  K 3.4* 4.0 3.1* 3.7  CL 98 101 103 103  CO2 31 29 28 26   GLUCOSE 96 106* 126* 113*  BUN 15 13 17 15   CREATININE 0.85 0.69 0.71 0.62  CALCIUM 9.5 8.4* 8.5* 8.0*  MG  --   --   --  2.1   GFR: Estimated Creatinine Clearance: 46.6 mL/min (by C-G formula based on SCr of 0.62 mg/dL). Liver Function Tests: Recent Labs  Lab 07/23/19 2017  AST 60*  ALT 24  ALKPHOS 64  BILITOT 1.2  PROT 7.6  ALBUMIN 4.7   Coagulation Profile: Recent Labs  Lab 07/23/19 2017 07/24/19 0411 07/25/19 0426 07/26/19 0430  INR 2.2* 2.2* 2.3* 2.0*   Urine analysis:    Component Value Date/Time   COLORURINE YELLOW 07/23/2019 2137    APPEARANCEUR CLEAR 07/23/2019 2137   LABSPEC 1.014 07/23/2019 2137   PHURINE 6.0 07/23/2019 2137   GLUCOSEU NEGATIVE 07/23/2019 2137   GLUCOSEU NEGATIVE 02/28/2013 1235   HGBUR NEGATIVE 07/23/2019 2137   BILIRUBINUR NEGATIVE 07/23/2019 2137   KETONESUR NEGATIVE 07/23/2019 2137   PROTEINUR NEGATIVE 07/23/2019 2137   UROBILINOGEN 0.2 02/28/2013 1235   NITRITE NEGATIVE 07/23/2019 2137   LEUKOCYTESUR SMALL (A) 07/23/2019 2137   Recent Results (from the past 240 hour(s))  SARS Coronavirus 2 by RT PCR (hospital order, performed in [facility] hospital lab) Nasopharyngeal Nasopharyngeal Swab     Status: None   Collection Time: 07/23/19 11:36 PM   Specimen: Nasopharyngeal Swab  Result Value Ref Range Status   SARS Coronavirus 2 NEGATIVE NEGATIVE Final    Comment: (NOTE) SARS-CoV-2 target nucleic acids are NOT DETECTED.  The SARS-CoV-2 RNA is generally detectable in upper and lower respiratory specimens during the acute phase of infection. The lowest concentration of SARS-CoV-2 viral copies this assay can detect is 250  copies / mL. A negative result does not preclude SARS-CoV-2 infection and should not be used as the sole basis for treatment or other patient management decisions.  A negative result may occur with improper specimen collection / handling, submission of specimen other than nasopharyngeal swab, presence of viral mutation(s) within the areas targeted by this assay, and inadequate number of viral copies (<250 copies / mL). A negative result must be combined with clinical observations, patient history, and epidemiological information.  Fact Sheet for Patients:   StrictlyIdeas.no  Fact Sheet for Healthcare Providers: BankingDealers.co.za  This test is not yet approved or  cleared by the [place] FDA and has been authorized for detection and/or diagnosis of SARS-CoV-2 by FDA under an Emergency Use Authorization (EUA).  This  EUA will remain in effect (meaning this test can be used) for the duration of the COVID-19 declaration under Section 564(b)(1) of the Act, 21 U.S.C. section 360bbb-3(b)(1), unless the authorization is terminated or revoked sooner.  Performed at [facility], [place] [address], [place], [place] [address]   Culture, blood (routine x 2)     Status: None (Preliminary result)   Collection Time: 07/24/19  3:59 AM   Specimen: BLOOD  Result Value Ref Range Status   Specimen Description   Final    BLOOD LEFT ANTECUBITAL Performed at [facility] [address] [place], [place] [address]    Special Requests   Final    BOTTLES DRAWN AEROBIC ONLY Blood Culture adequate volume Performed at [facility] [address] [place], [place] [address]    Culture   Final    NO GROWTH 2 DAYS Performed at [facility] [address] [place], [place] [address]    Report Status PENDING  Incomplete  Culture, blood (routine x 2)     Status: None (Preliminary result)   Collection Time: 07/24/19  4:11 AM   Specimen: Right Antecubital; Blood  Result Value Ref Range Status   Specimen Description   Final    RIGHT ANTECUBITAL Performed at [facility] [address], [place], [place] [address]    Special Requests   Final    BACTERIAL CASTS Blood Culture adequate volume Performed at [facility] [address], [place], [place] [address]    Culture   Final    NO GROWTH 2 DAYS Performed at [facility] [address] [place], [place] [address]    Report Status PENDING  Incomplete  Expectorated sputum assessment w rflx to resp cult     Status: None   Collection Time: 07/24/19 11:45 AM   Specimen: Sputum  Result Value Ref Range Status   Specimen Description SPUTUM  Final   Special Requests EXPECTORANT  Final   Sputum evaluation   Final    THIS SPECIMEN IS ACCEPTABLE FOR SPUTUM  CULTURE Performed at [facility], [place] [address], [place], [place] [address]    Report Status 07/24/2019 FINAL  Final  Culture, respiratory     Status: None   Collection Time: 07/24/19 11:45 AM   Specimen: SPU  Result Value Ref Range Status   Specimen Description   Final    SPUTUM Performed at [facility] [address], [place], [place] [address]    Special Requests   Final    EXPECTORANT Reflexed from [phone] Performed at [facility], [place] [address] [place], [place] [address]    Gram  Stain   Final    RARE WBC PRESENT, PREDOMINANTLY PMN FEW GRAM NEGATIVE RODS RARE GRAM POSITIVE COCCI IN PAIRS    Culture   Final    Consistent with normal respiratory flora. Performed at [facility], [place] [address] [place], [place] [address]    Report Status 07/26/2019 FINAL  Final      Radiology Studies: DG Chest Port 1 View  Result Date: 07/26/2019 CLINICAL DATA:  Cough EXAM: PORTABLE CHEST 1 VIEW COMPARISON:  Chest CT from 2 days ago FINDINGS: Trace left pleural effusion. No consolidation, edema, or air leak. CABG. Stable heart size and mediastinal contours IMPRESSION: Stable small volume effusion and atelectasis on the left. Electronically Signed   By: Monte Fantasia M.D.   On: 07/26/2019 05:14   DG Swallowing Func-Speech Pathology  Result Date: 07/25/2019 Objective Swallowing Evaluation: Type of Study: MBS-Modified Barium Swallow Study  Patient Details Name: Amber [name] MRN: [id number] Date of Birth: February 15, 1934 Today's Date: 07/25/2019 Time: SLP Start Time (ACUTE ONLY): 1017 -SLP Stop Time (ACUTE ONLY): 0855 SLP Time Calculation (min) (ACUTE ONLY): 23 min Past Medical History: Past Medical History: Diagnosis Date . Abnormality of gait 08/02/2014 . Cerebrovascular disease 05/21/2016 . Cervical spinal stenosis  . Chronic atrial fibrillation (HCC)   a. chronic/rate controlled;  b. chronic coumadin. . Chronic diastolic CHF  (congestive heart failure) ([place])   a. 02/2013 Echo: EF 55-60%, mild AI, mod dil LA. . CKD (chronic kidney disease), stage III  . COPD with emphysema ([place])   PFT 05/02/10>>FEV1 1.35(62%), FEV1% 66, DLCO 75% . Coronary artery disease   a. s/p CABG;  b. Abnormal nuc 2015 - managed medically. . CVA (cerebral vascular accident) ([place])   left sided weakness . Depression  . Fibromyalgia  . GERD (gastroesophageal reflux disease)   pepcid 2-3 times per week . Gout  . Hemiparesis and alteration of sensations as late effects of stroke ([place]) 01/17/2015 . History of melanoma   squamous cell, melanoma . Hyperlipidemia   a. statin intolerant, not felt to be candidate for PCSK9 due to chronically elevated CK levels. . Hypertension  . Hypertensive heart disease  . Hypothyroidism  . Insomnia  . Memory change 01/23/2014 . Nasal polyposis  . Osteoarthritis  . Osteoporosis  . Pneumonia   1990 . Tobacco abuse  Past Surgical History: Past Surgical History: Procedure Laterality Date . ABDOMINAL HYSTERECTOMY   . BREAST LUMPECTOMY  1980s  Benign lesion - right . carpel tunnel    right . CORONARY ARTERY BYPASS GRAFT  2000 . EYE SURGERY    bilateral cataracts . IR GENERIC HISTORICAL  03/31/2016  IR RADIOLOGIST EVAL & MGMT 03/31/2016 [facility]-INTERV RAD . RADIOLOGY WITH ANESTHESIA  12/24/2011  Procedure: RADIOLOGY WITH ANESTHESIA;  Surgeon: Medication Radiologist, MD;  Location: [place];  Service: Radiology;  Laterality: N/A;  Extra Cranial Vascular Stent . RADIOLOGY WITH ANESTHESIA N/A 07/19/2014  Procedure: ANGIOPLASTY;  Surgeon: Luanne Bras, MD;  Location: [place];  Service: Radiology;  Laterality: N/A; . RADIOLOGY WITH ANESTHESIA N/A 07/27/2014  Procedure: ANGIOPLASTY;  Surgeon: Luanne Bras, MD;  Location: [place];  Service: Radiology;  Laterality: N/A; . TONSILLECTOMY   HPI: Zakyla Tonche  is a 85 y.o. female with a known history of dementia, coronary artery disease, CVA, COPD, hypertension, hypothyroidism, chronic diastolic CHF and dyslipidemia,  who presented to the emergency room with acute onset of recent worsening cough with associated dyspnea and inability to expectorate.  Chest CT concerning for aspiration PNA vs mucous plugging.  Pt completed an MBS on 02/17/2017 with recommendations for regular solids and thin liquids.  Subjective: Pt was alert Assessment / Plan / Recommendation CHL IP CLINICAL IMPRESSIONS 07/25/2019 Clinical Impression Patient presents with minimal oral dysphagia resulting in minimal delay in oral transiting with intact pharyngeal swallow.  Mild pharyngo-cervical esophageal dysphagia characterized by prominent cricopharyngeus (See Rf 8, 86/170) with appearance of widened esophagus below did not trap barium at this time.  Radiologist not present to confirm.  Fortunately nearly 100% of barium flowed through this area - including tablet, pudding and solids.  But would advise to monitor for CP dysfunction impacts closely.  Pt did not report symptoms of dysphagia during testing. She did produce cough x2 once prior to [facility] and 2nd time during MBS- no aspiration or penetration of barium observed. SLP would advise pt to monitor however to assure tolerating well.  No aspiration or penetration of any consistency tested noted.  Pt is noted to become dyspenic and thus her swallow/respiration reciprocity may be compromised at times allowing episodic aspiration.  Due to weakness and dyspnea, recommend advance diet to dys3/thin with strict precautions. SLP Visit Diagnosis Dysphagia, pharyngoesophageal phase (R13.14);Dysphagia, oral phase (R13.11) Attention and concentration deficit following -- Frontal lobe and executive function deficit following -- Impact on safety and function Moderate aspiration risk   CHL IP TREATMENT RECOMMENDATION 07/24/2019 Treatment Recommendations Defer until completion of intrumental exam   Prognosis 07/25/2019 Prognosis for Safe Diet Advancement Fair Barriers to Reach Goals Cognitive deficits Barriers/Prognosis Comment --  CHL IP DIET RECOMMENDATION 07/25/2019 SLP Diet Recommendations Dysphagia 3 (Mech soft) solids;Thin liquid Liquid Administration via -- Medication Administration (No Data) Compensations Minimize environmental distractions;Slow rate;Small sips/bites Postural Changes Remain semi-upright after after feeds/meals (Comment);Seated upright at 90 degrees   CHL IP OTHER RECOMMENDATIONS 07/25/2019 Recommended Consults -- Oral Care Recommendations Oral care before and after PO Other Recommendations Have oral suction available   CHL IP FOLLOW UP RECOMMENDATIONS 07/25/2019 Follow up Recommendations Skilled Nursing facility;24 hour supervision/assistance   CHL IP FREQUENCY AND DURATION 07/25/2019 Speech Therapy Frequency (ACUTE ONLY) min 2x/week Treatment Duration 2 weeks      CHL IP ORAL PHASE 07/25/2019 Oral Phase WFL Oral - Pudding Teaspoon -- Oral - Pudding Cup -- Oral - Honey Teaspoon -- Oral - Honey Cup -- Oral - Nectar Teaspoon -- Oral - Nectar Cup WFL Oral - Nectar Straw -- Oral - Thin Teaspoon WFL Oral - Thin Cup WFL;Piecemeal swallowing Oral - Thin Straw WFL Oral - Puree WFL Oral - Mech Soft Delayed oral transit;Premature spillage;WFL Oral - Regular -- Oral - Multi-Consistency -- Oral - Pill Other (Comment) Oral Phase - Comment --  CHL IP PHARYNGEAL PHASE 07/25/2019 Pharyngeal Phase Impaired Pharyngeal- Pudding Teaspoon -- Pharyngeal -- Pharyngeal- Pudding Cup -- Pharyngeal -- Pharyngeal- Honey Teaspoon -- Pharyngeal -- Pharyngeal- Honey Cup -- Pharyngeal -- Pharyngeal- Nectar Teaspoon -- Pharyngeal -- Pharyngeal- Nectar Cup WFL;Delayed swallow initiation-vallecula Pharyngeal Material does not enter airway Pharyngeal- Nectar Straw -- Pharyngeal -- Pharyngeal- Thin Teaspoon WFL Pharyngeal Material does not enter airway Pharyngeal- Thin Cup [facility] Pharyngeal Material does not enter airway Pharyngeal- Thin Straw WFL Pharyngeal Material does not enter airway Pharyngeal- Puree Delayed swallow initiation-vallecula Pharyngeal Material  does not enter airway Pharyngeal- Mechanical Soft Delayed swallow initiation-vallecula Pharyngeal Material does not enter airway Pharyngeal- Regular -- Pharyngeal -- Pharyngeal- Multi-consistency -- Pharyngeal -- Pharyngeal- Pill WFL Pharyngeal Material does not enter airway Pharyngeal Comment --  CHL IP CERVICAL ESOPHAGEAL PHASE 07/25/2019 Cervical Esophageal Phase Impaired Pudding Teaspoon --  Pudding Cup -- Honey Teaspoon -- Honey Cup -- Nectar Teaspoon -- Nectar Cup Prominent cricopharyngeal segment Nectar Straw -- Thin Teaspoon Prominent cricopharyngeal segment Thin Cup Prominent cricopharyngeal segment Thin Straw Prominent cricopharyngeal segment Puree Prominent cricopharyngeal segment Mechanical Soft Prominent cricopharyngeal segment Regular -- Multi-consistency -- Pill Prominent cricopharyngeal segment Cervical Esophageal Comment Patient appears with prominent cricopharyngeus with widening of esophagus below concerning for potential CP achalasia.  Radiologist not present to confirm.  Fortunately nearly 100% of barium flowed through this area - including tablet, pudding and solids.  But would advise to monitor for CP dysfunction impacts closely.  Pt did not report symptoms of dysphagia during testing. She did produce cough x2 once prior to [facility] and 2nd time during MBS- no aspiration or penetration of barium observed. Kathleen Lime, MS [facility] SLP Acute Rehab Services Office [phone] [name] 07/25/2019, 10:09 AM               Scheduled Meds: . allopurinol  300 mg Oral QHS  . amLODipine  5 mg Oral QHS  . amoxicillin-clavulanate  1 tablet Oral BID  . citalopram  20 mg Oral BID  . clopidogrel  75 mg Oral Daily  . donepezil  5 mg Oral Daily  . feeding supplement  1 Container Oral BID BM  . fluticasone  1 spray Each Nare Daily  . guaiFENesin  1,200 mg Oral BID  . ipratropium-albuterol  3 mL Nebulization TID  . levothyroxine  25 mcg Oral Q0600  . losartan  25 mg Oral Daily  . memantine  10 mg Oral  BID  . metoprolol succinate  25 mg Oral q morning - 10a  . sodium chloride HYPERTONIC  4 mL Nebulization BID  . warfarin  5 mg Oral q1600  . Warfarin - Physician Dosing Inpatient   Does not apply q1600   Continuous Infusions: . potassium chloride       LOS: 3 days   Time spent: 25 minutes.  Patrecia Pour, MD [facility] [email or web address] 07/26/2019, 5:24 PM

## 2019-07-26 NOTE — Progress Notes (Signed)
NAME:  Amber [name], MRN:  [id number], DOB:  20-Apr-1934, LOS: 3 ADMISSION DATE:  07/23/2019, CONSULTATION DATE:  07/24/2019 REFERRING MD:  Dr. Bonner Puna, [facility], CHIEF COMPLAINT:  Cough   Brief History   85 yo female former smoker with hx of COPD and asthma developed rhinitis, cough, dyspnea, lethargy starting on 07/20/19.  She was at recent family gathering and several family members developed upper respiratory infection symptoms.  She has received COVID vaccination and COVID PCR was negative.  CT chest showed findings concerning for aspiration.  PCCM consulted to assist with respiratory management.  Past Medical History  Osteoporosis, Osteoarthritis, Nasal polyps, Hypothyroidism, Diastolic CHF, HTN, HLD, Melanoma, CVA, Gout, Dementia, GERD, Fibromyalgia, Depression, CAD s/p CABG, CKD 3a, Chronic a fib, Cervical spinal stenossi  Consults:    Procedures:    Significant Diagnostic Tests:   Echo 02/09/17 >> EF 60 to 65%, mild AS, mild MS, mod LA dilation, mod TR, PAS 52 mmHg  PFT 05/13/17 >> FEV1 1.24 (66%), FEV1% 71, TLC 4.48 (90%), DLCO 56%, +BD  CT angio chest 07/23/19 >> patulous esophagus, debris in trachea extending to mainstem bronchi  Micro Data:  SARS CoV2 PCR 6/12 >> negative Sputum 6/13 >> oral flora Blood 6/13 >> RVP 6/14 >> Rhinovirus  Antimicrobials:  Unasyn 6/12 >> 6/15 Zithromax 6/12 >> 6/15 Augmentin 6/15 >>   Interim history/subjective:  More alert.  Feels better.  Still has cough.  Objective   Blood pressure (!) 148/77, pulse 74, temperature 98.8 F (37.1 C), temperature source Oral, resp. rate 17, height 5\' 6"  (1.676 m), weight 54.4 kg, SpO2 99 %.        Intake/Output Summary (Last 24 hours) at 07/26/2019 1337 Last data filed at 07/26/2019 4098 Gross per 24 hour  Intake 896.16 ml  Output 400 ml  Net 496.16 ml   Filed Weights   07/23/19 1627  Weight: 54.4 kg    Examination:  General - more alert Eyes - pupils reactive ENT - no sinus tenderness, no  stridor, raspy voice that clears with cough Cardiac - regular rate/rhythm, no murmur Chest - scattered rhonchi, no wheeze Abdomen - soft, non tender, + bowel sounds Extremities - no cyanosis, clubbing, or edema Skin - no rashes Neuro - follows commands  Assessment & Plan:   Acute hypoxic respiratory failure. - combination of upper respiratory infection, aspiration, and COPD/asthma - needs to have assessment for home oxygen prior to discharge  Upper respiratory infection. - viral panel positive for rhinovirus  Aspiration pneumonitis. - regular diet - complete 5 days of ABx, changed to augmentin on 6/15 - continue bronchial hygiene - mobilize as able  COPD with asthma. - continue duoneb  Chronic diastolic CHF, Chronic A fib. - monitor fluid balance  Hx of CVA with dementia and depression. - DNR/DNI  Best practice:  Diet: D3 diet DVT prophylaxis: Coumadin GI prophylaxis: Not indicated Mobility: As tolerated Code Status: DNR/DNI Family Communication: updated family at bedside  Labs    CMP Latest Ref Rng & Units 07/26/2019 07/25/2019 07/24/2019  Glucose 70 - 99 mg/dL 113(H) 126(H) 106(H)  BUN 8 - 23 mg/dL 15 17 13   Creatinine 0.44 - 1.00 mg/dL 0.62 0.71 0.69  Sodium 135 - 145 mmol/L 138 142 140  Potassium 3.5 - 5.1 mmol/L 3.7 3.1(L) 4.0  Chloride 98 - 111 mmol/L 103 103 101  CO2 22 - 32 mmol/L 26 28 29   Calcium 8.9 - 10.3 mg/dL 8.0(L) 8.5(L) 8.4(L)  Total Protein 6.5 - 8.1 g/dL - - -  Total Bilirubin 0.3 - 1.2 mg/dL - - -  Alkaline Phos 38 - 126 U/L - - -  AST 15 - 41 U/L - - -  ALT 0 - 44 U/L - - -    CBC Latest Ref Rng & Units 07/24/2019 07/23/2019 04/29/2019  WBC 4.0 - 10.5 K/uL 9.8 8.5 8.2  Hemoglobin 12.0 - 15.0 g/dL 12.8 13.4 12.3  Hematocrit 36 - 46 % 40.4 42.5 38.6  Platelets 150 - 400 K/uL 174 176 195    Signature:  Chesley Mires, MD [place] Pager - [phone] 07/26/2019, 1:37 PM

## 2019-07-26 NOTE — Progress Notes (Signed)
Initial Nutrition Assessment  DOCUMENTATION CODES:   Not applicable  INTERVENTION:   Boost Breeze po BID, each supplement provides 250 kcal and 9 grams of protein  Magic cup TID with meals, each supplement provides 290 kcal and 9 grams of protein  Encourage PO intake   NUTRITION DIAGNOSIS:   Inadequate oral intake related to poor appetite as evidenced by meal completion < 25%, per patient/family report.  GOAL:   Patient will meet greater than or equal to 90% of their needs  MONITOR:   PO intake, Supplement acceptance  REASON FOR ASSESSMENT:   Malnutrition Screening Tool    ASSESSMENT:   Pt with remote hx of CVA, Afib on coumadin, stage III CKD, COPD with emphysema, CAD, chronic CHF, HTN, hypothyroidism, GERD, OA, and moderate dementia admitted with acute respiratory failure due to aspiration PNA and tracheobronchitis on COPD.    Pt lives with daughter, they are requesting home health services.  Spoke with both patient and daughter who was at bedside. Pt very pleasant but confused at times. Daughter able to provide history.  Per daughter pt has lost weight; she used to weigh 150's and is now down to 140's. Pt reports no appetite. Per daughter; Breakfast: bacon biscuit with jelly, Dinner: is out at [employer] and usually consists of a sweet potato and around 5-6 bites of chicken. Pt usually drinks a protein milkshake the daughter makes her daily with whole milk and protein powder. Daughter has provided pt with granola bars but pt has not ate them and seems forgetful.  Per daughter pt often sleeps in recliner but does get up on her own to the bathroom and is able to go out to eat dinner in the evenings.  Daughter very knowledgeable about nutrition and importance of adequacy. We discussed ways to increase nutrition at home.    6/14 s/p MBSS; pt started on dysphagia 3 with thin liquids  Meal Completion has been poor <20%  Medications reviewed and include: IV KCl  Labs  reviewed    NUTRITION - FOCUSED PHYSICAL EXAM:    Most Recent Value  Orbital Region Mild depletion  Upper Arm Region No depletion  Thoracic and Lumbar Region No depletion  Buccal Region No depletion  Temple Region Mild depletion  Clavicle Bone Region No depletion  Clavicle and Acromion Bone Region No depletion  Scapular Bone Region No depletion  Dorsal Hand Moderate depletion  Patellar Region No depletion  Anterior Thigh Region No depletion  Posterior Calf Region No depletion  Edema (RD Assessment) None  Hair Reviewed  Eyes Reviewed  Mouth Reviewed  Skin Reviewed  Nails Reviewed       Diet Order:   Diet Order            Diet regular Room service appropriate? Yes; Fluid consistency: Thin  Diet effective now                 EDUCATION NEEDS:   Education needs have been addressed  Skin:     Last BM:     Height:   Ht Readings from Last 1 Encounters:  07/23/19 5\' 6"  (1.676 m)    Weight:   Wt Readings from Last 1 Encounters:  07/23/19 54.4 kg    Ideal Body Weight:     BMI:  Body mass index is 19.37 kg/m.  Estimated Nutritional Needs:   Kcal:  1500-1700  Protein:  70-85 grams  Fluid:  >1.5 L/day  Lockie Pares., RD, LDN, CNSC See AMiON for contact  information

## 2019-07-26 NOTE — Progress Notes (Signed)
  Speech Language Pathology Treatment: Dysphagia  Patient Details Name: Amber [name] MRN: [id number] DOB: 06/26/1934 Today's Date: 07/26/2019 Time: 4158-3094 SLP Time Calculation (min) (ACUTE ONLY): 15 min  Assessment / Plan / Recommendation Clinical Impression  Pt and daughter benefited from verbal/tactile cueing to sit pt upright for po intake and assure she is not dyspneic with intake, Daughter reports pt will cough with liquids when taking medications at home but not with drinking liquids alone or eating meals.  Daughter states pt may aspirate when she is holding drink in her mouth while talking.  SLP observed pt consuming medication with puree and with water. No indication of airway compromise. [name] to [name] allowing oropharyngeal swallow evaluation and reviewed importance of controlling other risk factors *dyspnea, positioning, self feeding, etc. Pt consumes milk shakes with protein and given she mostly coughs with water - advised to consider milkshakes or thicker drinks with medications and meals and thin water between meals.  Daughter [name] reports understanding to information provided.  Advised to monitor po tolerance.  Pt very distracted during session and pulling at her telemetry box and oximetry monitor.  Provided her with washcloths to distract her and called secretary to request busy box for her.  SLP will follow up x1 more to assure tolerance and family education completed.  HPI HPI: Amber [name]  is a 85 y.o. female with a known history of dementia, coronary artery disease, CVA, COPD, hypertension, hypothyroidism, chronic diastolic CHF and dyslipidemia, who presented to the emergency room with acute onset of recent worsening cough with associated dyspnea and inability to expectorate.  Chest CT concerning for aspiration PNA vs mucous plugging.  Pt completed an MBS on 02/17/2017 with recommendations for regular solids and thin liquids.       SLP Plan  Continue with current plan of  care       Recommendations  Diet recommendations: Regular;Thin liquid (no tough meats) Medication Administration: Crushed with puree Compensations: Minimize environmental distractions;Slow rate;Small sips/bites Postural Changes and/or Swallow Maneuvers: Seated upright 90 degrees;Upright 30-60 min after meal                Oral Care Recommendations: Oral care BID;Staff/trained caregiver to provide oral care Follow up Recommendations: Skilled Nursing facility;24 hour supervision/assistance SLP Visit Diagnosis: Dysphagia, unspecified (R13.10) Plan: Continue with current plan of care       GO                [name] 07/26/2019, 6:33 PM   Kathleen Lime, MS [place] Office [phone]

## 2019-07-26 NOTE — Progress Notes (Signed)
PHARMACIST - PHYSICIAN COMMUNICATION  CONCERNING: Antibiotic IV to Oral Route Change Policy  RECOMMENDATION: This patient is receiving azithromycin by the intravenous route.  Based on criteria approved by the Pharmacy and Therapeutics Committee, the antibiotic(s) is/are being converted to the equivalent oral dose form(s).   DESCRIPTION: These criteria include:  Patient being treated for a respiratory tract infection, urinary tract infection, cellulitis or clostridium difficile associated diarrhea if on metronidazole  The patient is not neutropenic and does not exhibit a GI malabsorption state  The patient is eating (either orally or via tube) and/or has been taking other orally administered medications for a least 24 hours  The patient is improving clinically and has a Tmax < 100.5  If you have questions about this conversion, please contact the Pharmacy Department  []  ( [phone] )  [place] []  ( [phone]  [facility] []  ( [phone] )  [place] []  ( [phone] )  [facility] [x]  ( [phone] )  [facility]  

## 2019-07-26 NOTE — Care Management Important Message (Signed)
Important Message  Patient Details IM Letter given to Gabriel Earing RN Case Manager to present to the Patient Name: Amber [name] MRN: [id number] Date of Birth: May 11, 1934   Medicare Important Message Given:  Yes     Kerin Salen 07/26/2019, 10:32 AM

## 2019-07-26 NOTE — TOC Progression Note (Signed)
Transition of Care [facility]) - Progression Note    Patient Details  Name: ANGELIN CUTRONE MRN: [id number] Date of Birth: 16-Nov-1934  Transition of Care [facility]) CM/SW Contact  Purcell Mouton, RN Phone Number: 07/26/2019, 10:15 AM  Clinical Narrative:    A call to pt's daughter [name] was made concerning disposition. [name] states that she take pt home with her and asked for HHPT/NA from [facility]. Referral given to in house rep with [facility].    Expected Discharge Plan: [place] Barriers to Discharge: No Barriers Identified  Expected Discharge Plan and Services Expected Discharge Plan: [place] Choice: [place] arrangements for the past 2 months: Single Family Home                                       Social Determinants of Health (SDOH) Interventions    Readmission Risk Interventions No flowsheet data found.

## 2019-07-27 ENCOUNTER — Inpatient Hospital Stay (HOSPITAL_COMMUNITY): Payer: Medicare PPO

## 2019-07-27 DIAGNOSIS — J441 Chronic obstructive pulmonary disease with (acute) exacerbation: Secondary | ICD-10-CM

## 2019-07-27 MED ORDER — ARFORMOTEROL TARTRATE 15 MCG/2ML IN NEBU
15.0000 ug | INHALATION_SOLUTION | Freq: Two times a day (BID) | RESPIRATORY_TRACT | Status: AC
  Administered 2019-07-27 – 2019-07-29 (×6): 15 ug via RESPIRATORY_TRACT
  Filled 2019-07-27 (×6): qty 2

## 2019-07-27 MED ORDER — BUDESONIDE 0.5 MG/2ML IN SUSP
0.5000 mg | Freq: Two times a day (BID) | RESPIRATORY_TRACT | Status: AC
  Administered 2019-07-27 – 2019-07-29 (×6): 0.5 mg via RESPIRATORY_TRACT
  Filled 2019-07-27 (×6): qty 2

## 2019-07-27 MED ORDER — FLUTICASONE PROPIONATE 50 MCG/ACT NA SUSP: 1.0000 | Freq: Every day | NASAL | Status: DC | PRN

## 2019-07-27 MED ORDER — PREDNISONE 10 MG PO TABS
10.0000 mg | ORAL_TABLET | Freq: Every day | ORAL | Status: DC
  Administered 2019-07-27 – 2019-07-29 (×3): 10 mg via ORAL
  Filled 2019-07-27 (×3): qty 1

## 2019-07-27 NOTE — Progress Notes (Signed)
I assumed care of this pt @ 0038. I agree w/ the assessment completed by the previous RN and will continue to monitor pt.

## 2019-07-27 NOTE — Progress Notes (Addendum)
NAME:  Amber [name], MRN:  [id number], DOB:  [birth year], LOS: 4 ADMISSION DATE:  07/23/2019, CONSULTATION DATE:  07/24/2019 REFERRING MD:  Dr. Bonner Puna, [facility], CHIEF COMPLAINT:  Cough   Brief History   85 yo female former smoker with hx of COPD and asthma developed rhinitis, cough, dyspnea, lethargy starting on 07/20/19.  She was at recent family gathering and several family members developed upper respiratory infection symptoms.  She has received COVID vaccination and COVID PCR was negative.  CT chest showed findings concerning for aspiration.  PCCM consulted to assist with respiratory management.  Past Medical History  Osteoporosis, Osteoarthritis, Nasal polyps, Hypothyroidism, Diastolic CHF, HTN, HLD, Melanoma, CVA, Gout, Dementia, GERD, Fibromyalgia, Depression, CAD s/p CABG, CKD 3a, Chronic a fib, Cervical spinal stenossi  Consults:    Procedures:    Significant Diagnostic Tests:   Echo 02/09/17 >> EF 60 to 65%, mild AS, mild MS, mod LA dilation, mod TR, PAS 52 mmHg  PFT 05/13/17 >> FEV1 1.24 (66%), FEV1% 71, TLC 4.48 (90%), DLCO 56%, +BD  CT angio chest 07/23/19 >> patulous esophagus, debris in trachea extending to mainstem bronchi  Micro Data:  SARS CoV2 PCR 6/12 >> negative Sputum 6/13 >> oral flora Blood 6/13 >> RVP 6/14 >> Rhinovirus  Antimicrobials:  Unasyn 6/12 >> 6/15 Zithromax 6/12 >> 6/15 Augmentin 6/15 >>   Interim history/subjective:  Afebrile  Daughter at bedside, reports patients breathing appeared labored overnight Remains on 2L/[place]  Objective   Blood pressure (!) 158/91, pulse 87, temperature 98.7 F (37.1 C), temperature source Oral, resp. rate 18, height 5\' 6"  (1.676 m), weight 54.4 kg, SpO2 93 %.       No intake or output data in the 24 hours ending 07/27/19 0904 Filed Weights   07/23/19 1627  Weight: 54.4 kg    Examination: General: elderly female sitting up in chair eating breakfast in NAD, daughter at bedside HEENT: MM pink/moist, upper  airway wheeze, anicteric Neuro: AAOx4, speech clear, MAE  CV: s1s2 rrr, no m/r/g PULM: non-labored, prolonged expiratory phase, upper airway wheeze / wet sounding that clears with cough, scattered rhonchi bilaterally > also improves with cough  GI: soft, bsx4 active  Extremities: warm/dry, no edema, LLE brace in place  Skin: no rashes or lesions  Assessment & Plan:   Acute hypoxic respiratory failure. Suspect combination of upper respiratory infection, aspiration, and COPD/asthma -assess ambulatory O2 needs prior to discharge  -wean O2 for sats 88-95%  Upper respiratory infection / Rhinovirus  -supportive care  Aspiration pneumonitis -continue regular diet  -complete 5 days abx  -bronchial hygiene - IS, mobilize -aspiration precautions  -follow up CXR in am to ensure no evolving airspace disease / edema   COPD with asthma. -continue duoneb    Chronic diastolic CHF, Chronic A fib. -follow I/O's   Hx of CVA with dementia and depression. -DNR / DNI  Best practice:  Diet: D3 diet DVT prophylaxis: Coumadin GI prophylaxis: Not indicated Mobility: As tolerated Code Status: DNR/DNI Family Communication: Daughter updated at bedside 6/16 and via phone.   Labs    CMP Latest Ref Rng & Units 07/26/2019 07/25/2019 07/24/2019  Glucose 70 - 99 mg/dL 113(H) 126(H) 106(H)  BUN 8 - 23 mg/dL 15 17 13   Creatinine 0.44 - 1.00 mg/dL 0.62 0.71 0.69  Sodium 135 - 145 mmol/L 138 142 140  Potassium 3.5 - 5.1 mmol/L 3.7 3.1(L) 4.0  Chloride 98 - 111 mmol/L 103 103 101  CO2 22 - 32 mmol/L 26  28 29  Calcium 8.9 - 10.3 mg/dL 8.0(L) 8.5(L) 8.4(L)  Total Protein 6.5 - 8.1 g/dL - - -  Total Bilirubin 0.3 - 1.2 mg/dL - - -  Alkaline Phos 38 - 126 U/L - - -  AST 15 - 41 U/L - - -  ALT 0 - 44 U/L - - -    CBC Latest Ref Rng & Units 07/24/2019 07/23/2019 04/29/2019  WBC 4.0 - 10.5 K/uL 9.8 8.5 8.2  Hemoglobin 12.0 - 15.0 g/dL 12.8 13.4 12.3  Hematocrit 36 - 46 % 40.4 42.5 38.6  Platelets 150 -  400 K/uL 174 176 195    Signature:   Noe Gens, MSN, NP-C [place] [facility] 07/27/2019, 9:04 AM   Please see [email or web address] for pager details.

## 2019-07-27 NOTE — Progress Notes (Signed)
Amber [name]  IHW:[id number] DOB: 28-May-1934 DOA: 07/23/2019 PCP: Antony Contras, MD    Brief Narrative:  85 year old with a remote history of CVA, chronic atrial fibrillation on Coumadin, stage IIIa CKD, COPD, CAD, chronic diastolic CHF, HTN, hypothyroidism, GERD, fibromyalgia, and moderate dementia who presented from home with several days of increasing wet cough and dyspnea.  On evaluation she was found to be hypoxic.  WBC was normal.  CT chest revealed significant debris and fluid collection in the airways suggesting mucous plugging.  Given full history there was a suspicion of aspiration pneumonitis.  Since her admission the patient has been seen by speech pathology and the pulmonary team.  Modified barium swallow revealed no evidence of significant dysphagia.  Respiratory virus panel revealed a rhinovirus.  Antimicrobials:  Unasyn 6/12 > 6/15 Azithromycin 6/12 > 6/14 Augmentin 6/15 > 6/16  Subjective: The patient is resting comfortably bedside chair at the time of visit.  Last night however she developed significant respiratory distress as noted by the RN and RT.  She appeared to be tachypneic and wheezing.  This appears to improve when she is awake though she does rapidly develop evidence of respiratory difficulty as soon as she falls asleep again.  She denies any chest pain or abdominal pain but her history is of questionable reliability.  Assessment & Plan:  Multifactorial acute hypoxic respiratory failure Likely a combination of all of the factors listed below  Aspiration pneumonitis MBS 6/14 showed only limited evidence of aspiration with a recommendation for D3 diet - family has chosen to continue regular diet with acceptance of aspiration risk which I feel is appropriate appropriate-completed 5 days of Augmentin therapy empirically -I discussed with the patient's family today the likelihood that she is suffering intermittent episodes of aspiration on a low-grade level which are  likely contributing to her respiratory issues  Rhinovirus URI -tracheobronchitis Management of acute tracheobronchitis as per PCCM recommendations  Chronic diastolic CHF Appears clinically compensated at present  Chronic atrial fibrillation Rate controlled with the use of metoprolol -continue Coumadin therapy with dosing per pharmacy  CAD Continue beta-blocker and Plavix -has reported allergy to statin  Gout Continue allopurinol  Dementia with depression Continue Aricept Namenda and SSRI  Hypokalemia  Hypothyroidism Continue usual Synthroid dose  DVT prophylaxis: Coumadin Code Status: NO CODE BLUE Family Communication:  Status is: Inpatient  Remains inpatient appropriate because:Inpatient level of care appropriate due to severity of illness   Dispo: The patient is from: Home              Anticipated d/c is to: Home              Anticipated d/c date is: 2 days              Patient currently is not medically stable to d/c.  Consultants:  none  Objective: Blood pressure (!) 158/91, pulse 87, temperature 98.7 F (37.1 C), temperature source Oral, resp. rate 18, height 5\' 6"  (1.676 m), weight 54.4 kg, SpO2 96 %. No intake or output data in the 24 hours ending 07/27/19 0956 Filed Weights   07/23/19 1627  Weight: 54.4 kg    Examination: General: No acute respiratory distress when alert  Lungs: fine crackles B bases - no wheezing when awake  Cardiovascular: Regular rate without murmur gallop or rub normal S1 and S2 Abdomen: Nontender, nondistended, soft, bowel sounds positive, no rebound, no ascites, no appreciable mass Extremities: trace B LE edema  CBC: Recent Labs  Lab  07/23/19 2017 07/24/19 0411  WBC 8.5 9.8  NEUTROABS 5.9  --   HGB 13.4 12.8  HCT 42.5 40.4  MCV 94.4 94.2  PLT 176 161   Basic Metabolic Panel: Recent Labs  Lab 07/24/19 0411 07/25/19 0426 07/26/19 0430  NA 140 142 138  K 4.0 3.1* 3.7  CL 101 103 103  CO2 29 28 26   GLUCOSE 106*  126* 113*  BUN 13 17 15   CREATININE 0.69 0.71 0.62  CALCIUM 8.4* 8.5* 8.0*  MG  --   --  2.1   GFR: Estimated Creatinine Clearance: 46.6 mL/min (by C-G formula based on SCr of 0.62 mg/dL).  Liver Function Tests: Recent Labs  Lab 07/23/19 2017  AST 60*  ALT 24  ALKPHOS 64  BILITOT 1.2  PROT 7.6  ALBUMIN 4.7    Coagulation Profile: Recent Labs  Lab 07/23/19 2017 07/24/19 0411 07/25/19 0426 07/26/19 0430  INR 2.2* 2.2* 2.3* 2.0*    HbA1C: Hgb A1c MFr Bld  Date/Time Value Ref Range Status  03/05/2017 07:16 AM 6.0 (H) 4.8 - 5.6 % Final    Comment:    (NOTE) Pre diabetes:          5.7%-6.4% Diabetes:              >6.4% Glycemic control for   <7.0% adults with diabetes   02/14/2013 02:10 AM 6.3 (H) <5.7 % Final    Comment:    (NOTE)                                                                       According to the ADA Clinical Practice Recommendations for 2011, when HbA1c is used as a screening test:  >=6.5%   Diagnostic of Diabetes Mellitus           (if abnormal result is confirmed) 5.7-6.4%   Increased risk of developing Diabetes Mellitus References:Diagnosis and Classification of Diabetes Mellitus,Diabetes WRUE,4540,98(JXBJY 1):S62-S69 and Standards of Medical Care in         Diabetes - 2011,Diabetes Care,2011,34 (Suppl 1):S11-S61.     Recent Results (from the past 240 hour(s))  SARS Coronavirus 2 by RT PCR (hospital order, performed in [facility] hospital lab) Nasopharyngeal Nasopharyngeal Swab     Status: None   Collection Time: 07/23/19 11:36 PM   Specimen: Nasopharyngeal Swab  Result Value Ref Range Status   SARS Coronavirus 2 NEGATIVE NEGATIVE Final    Comment: (NOTE) SARS-CoV-2 target nucleic acids are NOT DETECTED.  The SARS-CoV-2 RNA is generally detectable in upper and lower respiratory specimens during the acute phase of infection. The lowest concentration of SARS-CoV-2 viral copies this assay can detect is 250 copies / mL. A negative  result does not preclude SARS-CoV-2 infection and should not be used as the sole basis for treatment or other patient management decisions.  A negative result may occur with improper specimen collection / handling, submission of specimen other than nasopharyngeal swab, presence of viral mutation(s) within the areas targeted by this assay, and inadequate number of viral copies (<250 copies / mL). A negative result must be combined with clinical observations, patient history, and epidemiological information.  Fact Sheet for Patients:   StrictlyIdeas.no  Fact Sheet for Healthcare Providers: BankingDealers.co.za  This test is not yet approved or  cleared by the [place] and has been authorized for detection and/or diagnosis of SARS-CoV-2 by FDA under an Emergency Use Authorization (EUA).  This EUA will remain in effect (meaning this test can be used) for the duration of the COVID-19 declaration under Section 564(b)(1) of the Act, 21 U.S.C. section 360bbb-3(b)(1), unless the authorization is terminated or revoked sooner.  Performed at [facility], [place] [address], [place], [place] [address]   Culture, blood (routine x 2)     Status: None (Preliminary result)   Collection Time: 07/24/19  3:59 AM   Specimen: BLOOD  Result Value Ref Range Status   Specimen Description   Final    BLOOD LEFT ANTECUBITAL Performed at [place] [address] [place], [place] [address]    Special Requests   Final    BOTTLES DRAWN AEROBIC ONLY Blood Culture adequate volume Performed at [place] [address], [place], [place] [address]    Culture   Final    NO GROWTH 2 DAYS Performed at [place] [address] [place], [place] [address]    Report Status PENDING  Incomplete  Culture, blood (routine x 2)     Status: None (Preliminary result)   Collection Time: 07/24/19   4:11 AM   Specimen: Right Antecubital; Blood  Result Value Ref Range Status   Specimen Description   Final    RIGHT ANTECUBITAL Performed at [place] [address], [place], [place] [address]    Special Requests   Final    BACTERIAL CASTS Blood Culture adequate volume Performed at [place] [address] [place], [place] [address]    Culture   Final    NO GROWTH 2 DAYS Performed at [place] [address], [place], [place] [address]    Report Status PENDING  Incomplete  Expectorated sputum assessment w rflx to resp cult     Status: None   Collection Time: 07/24/19 11:45 AM   Specimen: Sputum  Result Value Ref Range Status   Specimen Description SPUTUM  Final   Special Requests EXPECTORANT  Final   Sputum evaluation   Final    THIS SPECIMEN IS ACCEPTABLE FOR SPUTUM CULTURE Performed at [facility], [place] [address] [place], [place] [address]    Report Status 07/24/2019 FINAL  Final  Culture, respiratory     Status: None   Collection Time: 07/24/19 11:45 AM   Specimen: SPU  Result Value Ref Range Status   Specimen Description   Final    SPUTUM Performed at [place] [address], [place], [place] [address]    Special Requests   Final    EXPECTORANT Reflexed from [id number] Performed at [facility], [place] [address] [place], [place] [address]    Gram Stain   Final    RARE WBC PRESENT, PREDOMINANTLY PMN FEW GRAM NEGATIVE RODS RARE GRAM POSITIVE COCCI IN PAIRS    Culture   Final    Consistent with normal respiratory flora. Performed at [facility], [place] [address] [place], [place] [address]    Report Status 07/26/2019 FINAL  Final     Scheduled Meds: . allopurinol  300 mg Oral QHS  . amLODipine  5 mg Oral QHS  . amoxicillin-clavulanate  1 tablet Oral BID  . citalopram  20 mg Oral BID  .  clopidogrel  75 mg Oral Daily  . donepezil  5 mg Oral  Daily  . feeding supplement  1 Container Oral BID BM  . fluticasone  1 spray Each Nare Daily  . furosemide  80 mg Oral Daily  . guaiFENesin  1,200 mg Oral BID  . ipratropium-albuterol  3 mL Nebulization TID  . levothyroxine  25 mcg Oral Q0600  . losartan  25 mg Oral Daily  . memantine  10 mg Oral BID  . metoprolol succinate  25 mg Oral q morning - 10a  . warfarin  5 mg Oral q1600  . Warfarin - Physician Dosing Inpatient   Does not apply q1600     LOS: 4 days   Cherene Altes, MD [facility] Office  [phone] Pager - Text Page per Amion  If 7PM-7AM, please contact night-coverage per Amion 07/27/2019, 9:56 AM

## 2019-07-27 NOTE — Plan of Care (Signed)

## 2019-07-27 NOTE — Progress Notes (Signed)
Occupational Therapy Treatment Patient Details Name: Amber [name] MRN: [id number] DOB: [birth year] Today's Date: 07/27/2019    History of present illness 85 y.o. female with a history of chronic AFib on coumadin, stage IIIa CKD, COPD with emphysema, CAD, chronic HFpEF, HTN, hypothyroidism, GERD, osteoarthritis and fibromyalgia, moderate dementia, CVA with left weakness, fall down stairs in March 2021 resulting in left rib fx and T2, 6,9 compression fractures. Pt presented from home due to several days of increasing wet cough and dyspnea. On evaluation she was afebrile with hypoxia.  Pt admitted for Acute hypoxemic respiratory failure due to aspiration pneumonia, pneumonitis and tracheobronchitis on COPD without acute exacerbation   OT comments  Treatment focused on improving patient's ability to perform and participate in self care tasks as well as improving cardiopulmomary endurance. Patient provided with reiteration in the use of flutter valve to promote expansion of airways and mobilize mucus. Patient performed x 10 with therapist and able to expel mucus and cough to improve breath sounds. Patient washed face and hands, ambulated to bathroom with RW and performed toileting. Patient needed assistance with wiping secondary to prong on finger of right hand. O2 sats maintained above 90% on RA with activity.    Follow Up Recommendations  Home health OT;Other (comment)    Equipment Recommendations  None recommended by OT    Recommendations for Other Services      Precautions / Restrictions Precautions Precautions: Fall Precaution Comments: L AFO, hx of L deficits since CVA; monitor sats       Mobility Bed Mobility                  Transfers                      Balance                                           ADL either performed or assessed with clinical judgement   ADL       Grooming: Set up;Sitting;Wash/dry face Grooming Details (indicate  cue type and reason): Patient initially drowsy. Patient washed face sitting in recliner to improve alertness.                 Toilet Transfer: Magazine features editor Details (indicate cue type and reason): Min guard to ambulate to toilet and perform toilet transfer - verbal cue to use grab bar. Toileting- Clothing Manipulation and Hygiene: Moderate assistance;Sit to/from stand;Cueing for safety Toileting - Clothing Manipulation Details (indicate cue type and reason): Needing assistance for wiping - limiting by lines on right hand.       General ADL Comments: Patient seated in recliner when therapist entered the room. Patient washed face and hands to improve alertness and prepare for activity. Patinet sounding wheezy and congested. Patient encouarged to use flutter valve to promote expectoration. Patient perfomred x 10 with verbal cues. O2 sats maintained above 90% on RA during ADLs.     Vision       Immunologist  Shoulder Instructions       General Comments      Pertinent Vitals/ Pain          Home Living                                          Prior Functioning/Environment              Frequency  Min 2X/week        Progress Toward Goals  OT Goals(current goals can now be found in the care plan section)  Progress towards OT goals: Progressing toward goals  Acute Rehab OT Goals Patient Stated Goal: home OT Goal Formulation: With patient Time For Goal Achievement: 08/01/19  Plan Discharge plan remains appropriate    Co-evaluation                 AM-PAC OT "6 Clicks" Daily Activity     Outcome Measure   Help from another person eating meals?: A Little Help from another person taking care of personal grooming?: A Little Help from another person toileting, which includes using toliet, bedpan, or urinal?: A  Lot Help from another person bathing (including washing, rinsing, drying)?: A Lot Help from another person to put on and taking off regular upper body clothing?: A Little Help from another person to put on and taking off regular lower body clothing?: A Lot 6 Click Score: 15    End of Session Equipment Utilized During Treatment: Gait belt;Rolling walker  OT Visit Diagnosis: Unsteadiness on feet (R26.81);Other abnormalities of gait and mobility (R26.89);Muscle weakness (generalized) (M62.81)   Activity Tolerance Patient tolerated treatment well   Patient Left in chair;with call bell/phone within reach;with family/visitor present   Nurse Communication  (okay to see)        Time: 6144-3154 OT Time Calculation (min): 26 min  Charges: OT General Charges $OT Visit: 1 Visit OT Treatments $Self Care/Home Management : 23-37 mins  Rylee Nuzum, OTR/L [place]  Office [phone] Pager: [place] 07/27/2019, 5:26 PM

## 2019-07-27 NOTE — Progress Notes (Signed)
Physical Therapy Treatment Patient Details Name: Amber [name] MRN: [id number] DOB: [birth year] Today's Date: 07/27/2019    History of Present Illness 85 y.o. female with a history of chronic AFib on coumadin, stage IIIa CKD, COPD with emphysema, CAD, chronic HFpEF, HTN, hypothyroidism, GERD, osteoarthritis and fibromyalgia, moderate dementia, CVA with left weakness, fall down stairs in March 2021 resulting in left rib fx and T2, 6,9 compression fractures. Pt presented from home due to several days of increasing wet cough and dyspnea. On evaluation she was afebrile with hypoxia.  Pt admitted for Acute hypoxemic respiratory failure due to aspiration pneumonia, pneumonitis and tracheobronchitis on COPD without acute exacerbation    PT Comments    Pt assisted with ambulating in hallway and currently requiring supplemental oxygen.  SATURATION QUALIFICATIONS: (This note is used to comply with regulatory documentation for home oxygen)  Patient Saturations on Room Air at Rest = 93%  Patient Saturations on Room Air while Ambulating = 86%  Patient Saturations on 2 Liters of oxygen while Ambulating =  90%  Please briefly explain why patient needs home oxygen: to improve oxygen saturations during physical activities such as ambulation.     Follow Up Recommendations  SNF;Supervision/Assistance - 24 hour     Equipment Recommendations  None recommended by PT    Recommendations for Other Services       Precautions / Restrictions Precautions Precautions: Fall Precaution Comments: L AFO, hx of L deficits since CVA; monitor sats    Mobility  Bed Mobility               General bed mobility comments: pt in recliner  Transfers Overall transfer level: Needs assistance Equipment used: Rolling walker (2 wheeled) Transfers: Sit to/from Stand Sit to Stand: Min assist         General transfer comment: verbal cues for safe technique, assist to rise,  steady  Ambulation/Gait Ambulation/Gait assistance: Min assist;Min guard Gait Distance (Feet): 90 Feet Assistive device: Rolling walker (2 wheeled) Gait Pattern/deviations: Step-through pattern;Decreased stride length     General Gait Details: occasional steady assist; cues for pursed lip breathing however pt with difficulty (currently mouth breathing); required 2L O2 Bier   Stairs             Wheelchair Mobility    Modified Rankin (Stroke Patients Only)       Balance                                            Cognition Arousal/Alertness: Awake/alert Behavior During Therapy: WFL for tasks assessed/performed Overall Cognitive Status: Impaired/Different from baseline                                 General Comments: more tired today, daughter reports little sleep last night, follows commands and requires repeated cues      Exercises      General Comments        Pertinent Vitals/Pain Pain Assessment: No/denies pain    Home Living                      Prior Function            PT Goals (current goals can now be found in the care plan section) Progress towards PT goals: Progressing toward goals  Frequency    Min 3X/week      PT Plan Current plan remains appropriate    Co-evaluation              AM-PAC PT "6 Clicks" Mobility   Outcome Measure  Help needed turning from your back to your side while in a flat bed without using bedrails?: A Little Help needed moving from lying on your back to sitting on the side of a flat bed without using bedrails?: A Little Help needed moving to and from a bed to a chair (including a wheelchair)?: A Little Help needed standing up from a chair using your arms (e.g., wheelchair or bedside chair)?: A Little Help needed to walk in hospital room?: A Little Help needed climbing 3-5 steps with a railing? : A Lot 6 Click Score: 17    End of Session Equipment Utilized  During Treatment: Gait belt;Oxygen Activity Tolerance: Patient limited by fatigue Patient left: in chair;with call bell/phone within reach;with family/visitor present Nurse Communication: Mobility status PT Visit Diagnosis: Other abnormalities of gait and mobility (R26.89)     Time: 1111-1130 PT Time Calculation (min) (ACUTE ONLY): 19 min  Charges:  $Gait Training: 8-22 mins                    Arlyce Dice, DPT Acute Rehabilitation Services Pager: [phone] Office: [phone]   [place] Ram E 07/27/2019, 12:55 PM

## 2019-07-27 NOTE — TOC Progression Note (Signed)
Transition of Care [facility]) - Progression Note    Patient Details  Name: [name] MRN: [id number] Date of Birth: [birth year]  Transition of Care [facility]) CM/SW Contact  Purcell Mouton, RN Phone Number: 07/27/2019, 3:20 PM  Clinical Narrative:    Spoke with [place] Bailiff. Pt's daughter concerning home O2. [employer] with follow for DME/ Oxygen at home.    Expected Discharge Plan: [place] Barriers to Discharge: No Barriers Identified  Expected Discharge Plan and Services Expected Discharge Plan: [place] Choice: [place] arrangements for the past 2 months: Single Family Home                                       Social Determinants of Health (SDOH) Interventions    Readmission Risk Interventions No flowsheet data found.

## 2019-07-27 NOTE — Progress Notes (Signed)
Assumed care of patient approximately 0200.  I agree with assessment of primary RN Tori. Called on call to get a repeat chest x ray.  Lungs sounding more wet than previous 2 RT assessments.

## 2019-07-28 ENCOUNTER — Inpatient Hospital Stay (HOSPITAL_COMMUNITY): Payer: Medicare PPO

## 2019-07-28 LAB — BASIC METABOLIC PANEL
Anion gap: 14 (ref 5–15)
BUN: 16 mg/dL (ref 8–23)
CO2: 24 mmol/L (ref 22–32)
Calcium: 8.5 mg/dL — ABNORMAL LOW (ref 8.9–10.3)
Chloride: 99 mmol/L (ref 98–111)
Creatinine, Ser: 0.71 mg/dL (ref 0.44–1.00)
GFR calc Af Amer: >60 mL/min (ref >60)
GFR calc non Af Amer: >60 mL/min (ref >60)
Glucose, Bld: 136 mg/dL — ABNORMAL HIGH (ref 70–99)
Potassium: 3.7 mmol/L (ref 3.5–5.1)
Sodium: 137 mmol/L (ref 135–145)

## 2019-07-28 LAB — PROTIME-INR
INR: 2.4 — ABNORMAL HIGH (ref 0.8–1.2)
Prothrombin Time: 25.6 seconds — ABNORMAL HIGH (ref 11.4–15.2)

## 2019-07-28 NOTE — Progress Notes (Signed)
NAME:  Amber [name], MRN:  [id number], DOB:  06/23/1934, LOS: 5 ADMISSION DATE:  07/23/2019, CONSULTATION DATE:  07/24/2019 REFERRING MD:  Dr. Bonner Puna, [facility], CHIEF COMPLAINT:  Cough   Brief History   85 yo female former smoker with hx of COPD and asthma developed rhinitis, cough, dyspnea, lethargy starting on 07/20/19.  She was at recent family gathering and several family members developed upper respiratory infection symptoms.  She has received COVID vaccination and COVID PCR was negative.  CT chest showed findings concerning for aspiration.  PCCM consulted to assist with respiratory management.  Past Medical History  Osteoporosis, Osteoarthritis, Nasal polyps, Hypothyroidism, Diastolic CHF, HTN, HLD, Melanoma, CVA, Gout, Dementia, GERD, Fibromyalgia, Depression, CAD s/p CABG, CKD 3a, Chronic a fib, Cervical spinal stenossi  Consults:    Procedures:    Significant Diagnostic Tests:   Echo 02/09/17 >> EF 60 to 65%, mild AS, mild MS, mod LA dilation, mod TR, PAS 52 mmHg  PFT 05/13/17 >> FEV1 1.24 (66%), FEV1% 71, TLC 4.48 (90%), DLCO 56%, +BD  CT angio chest 07/23/19 >> patulous esophagus, debris in trachea extending to mainstem bronchi  Micro Data:  SARS CoV2 PCR 6/12 >> negative Sputum 6/13 >> oral flora Blood 6/13 >> RVP 6/14 >> Rhinovirus  Antimicrobials:  Unasyn 6/12 >> 6/15 Zithromax 6/12 >> 6/15 Augmentin 6/15 >>   Interim history/subjective:  Needed O2 overnight.  Her daughter feels breathing is less labored.  Pt also feels she is breathing better.  Objective   Blood pressure (!) 154/96, pulse 69, temperature 97.7 F (36.5 C), temperature source Axillary, resp. rate 18, height 5\' 6"  (1.676 m), weight 54.4 kg, SpO2 97 %.       No intake or output data in the 24 hours ending 07/28/19 0955 Filed Weights   07/23/19 1627  Weight: 54.4 kg    Examination:  General - alert Eyes - pupils reactive ENT - raspy voice that clears with cough Cardiac - regular rate/rhythm,  no murmur Chest - scattered rhonchi Abdomen - soft, non tender, + bowel sounds Extremities - no cyanosis, clubbing, or edema Skin - no rashes Neuro - follows commands Psych - normal mood and behavior   Assessment & Plan:   Acute hypoxic respiratory failure from tracheobronchitis and COPD/asthma exacerbation.. - goal SpO2 > 90% - will need to arrange for home oxygen set up  - transitioned to oral ABx - f/u CXR as needed  COPD/asthma exacerbation 2nd to Rhinoviral infection. - continue prednisone - continue pulmicort, brovana, prn duoneb  Chronic diastolic CHF, Chronic A fib. - follow I/O's   Hx of CVA with dementia and depression. - per primary team  Deconditioning. - family deciding between SNF and PT at home  Best practice:  Diet: regular DVT prophylaxis: Coumadin GI prophylaxis: Not indicated Mobility: As tolerated Code Status: DNR/DNI Family Communication: spoke with daughter at bedside  Labs    CMP Latest Ref Rng & Units 07/28/2019 07/26/2019 07/25/2019  Glucose 70 - 99 mg/dL 136(H) 113(H) 126(H)  BUN 8 - 23 mg/dL 16 15 17   Creatinine 0.44 - 1.00 mg/dL 0.71 0.62 0.71  Sodium 135 - 145 mmol/L 137 138 142  Potassium 3.5 - 5.1 mmol/L 3.7 3.7 3.1(L)  Chloride 98 - 111 mmol/L 99 103 103  CO2 22 - 32 mmol/L 24 26 28   Calcium 8.9 - 10.3 mg/dL 8.5(L) 8.0(L) 8.5(L)  Total Protein 6.5 - 8.1 g/dL - - -  Total Bilirubin 0.3 - 1.2 mg/dL - - -  Alkaline Phos 38 - 126 U/L - - -  AST 15 - 41 U/L - - -  ALT 0 - 44 U/L - - -    CBC Latest Ref Rng & Units 07/24/2019 07/23/2019 04/29/2019  WBC 4.0 - 10.5 K/uL 9.8 8.5 8.2  Hemoglobin 12.0 - 15.0 g/dL 12.8 13.4 12.3  Hematocrit 36 - 46 % 40.4 42.5 38.6  Platelets 150 - 400 K/uL 174 176 195    Signature:   Chesley Mires, MD [place] Pager - [phone] 07/28/2019, 9:59 AM

## 2019-07-28 NOTE — Progress Notes (Signed)
Amber [name]  PYK:[id number] DOB: Feb 18, 1934 DOA: 07/23/2019 PCP: Antony Contras, MD    Brief Narrative:  85 year old with a remote history of CVA, chronic atrial fibrillation on Coumadin, stage IIIa CKD, COPD, CAD, chronic diastolic CHF, HTN, hypothyroidism, GERD, fibromyalgia, and moderate dementia who presented from home with several days of increasing wet cough and dyspnea.  On evaluation she was found to be hypoxic.  WBC was normal.  CT chest revealed significant debris and fluid collection in the airways suggesting mucous plugging.  Given full history there was a suspicion of aspiration pneumonitis.  Since her admission the patient has been seen by speech pathology and the pulmonary team.  Modified barium swallow revealed no evidence of significant dysphagia.  Respiratory virus panel revealed a rhinovirus.  Antimicrobials:  Unasyn 6/12 > 6/15 Azithromycin 6/12 > 6/14 Augmentin 6/15 > 6/16  Subjective: Appears to be stabilizing at this time. No new complaints today.   Assessment & Plan:  Multifactorial acute hypoxic respiratory failure Likely a combination of all of the factors listed below - appears stable today   Aspiration pneumonitis MBS 6/14 showed only limited evidence of aspiration with a recommendation for D3 diet - family has chosen to continue regular diet with acceptance of aspiration risk which I feel is appropriate appropriate-completed 5 days of Augmentin therapy empirically -I discussed with the patient's family today the likelihood that she is suffering intermittent episodes of aspiration on a low-grade level which are likely contributing to her respiratory issues  Rhinovirus URI -tracheobronchitis Management of acute tracheobronchitis as per PCCM recommendations  Chronic diastolic CHF Appears clinically compensated at present  Chronic atrial fibrillation Rate controlled with the use of metoprolol -continue Coumadin therapy with dosing per  pharmacy  CAD Continue beta-blocker and Plavix -has reported allergy to statin  Gout Continue allopurinol  Dementia with depression Continue Aricept Namenda and SSRI  Hypokalemia  Hypothyroidism Continue usual Synthroid dose  DVT prophylaxis: Coumadin Code Status: NO CODE BLUE Family Communication:  Status is: Inpatient  Remains inpatient appropriate because:Inpatient level of care appropriate due to severity of illness   Dispo: The patient is from: Home              Anticipated d/c is to: Home              Anticipated d/c date is: 2 days              Patient currently is not medically stable to d/c.  Consultants:  none  Objective: Blood pressure (!) 154/96, pulse 69, temperature 97.7 F (36.5 C), temperature source Axillary, resp. rate 18, height 5\' 6"  (1.676 m), weight 54.4 kg, SpO2 97 %. No intake or output data in the 24 hours ending 07/28/19 0854 Filed Weights   07/23/19 1627  Weight: 54.4 kg    Examination: General: No acute respiratory distress   Lungs: scattered fine crackles - no wheezing  Cardiovascular: Regular rate without murmur  Abdomen: Nontender, nondistended, soft Extremities: trace B LE edema  CBC: Recent Labs  Lab 07/23/19 2017 07/24/19 0411  WBC 8.5 9.8  NEUTROABS 5.9  --   HGB 13.4 12.8  HCT 42.5 40.4  MCV 94.4 94.2  PLT 176 539   Basic Metabolic Panel: Recent Labs  Lab 07/25/19 0426 07/26/19 0430 07/28/19 0435  NA 142 138 137  K 3.1* 3.7 3.7  CL 103 103 99  CO2 28 26 24   GLUCOSE 126* 113* 136*  BUN 17 15 16   CREATININE 0.71 0.62 0.71  CALCIUM 8.5* 8.0* 8.5*  MG  --  2.1  --    GFR: Estimated Creatinine Clearance: 46.6 mL/min (by C-G formula based on SCr of 0.71 mg/dL).  Liver Function Tests: Recent Labs  Lab 07/23/19 2017  AST 60*  ALT 24  ALKPHOS 64  BILITOT 1.2  PROT 7.6  ALBUMIN 4.7    Coagulation Profile: Recent Labs  Lab 07/23/19 2017 07/24/19 0411 07/25/19 0426 07/26/19 0430 07/28/19 0435   INR 2.2* 2.2* 2.3* 2.0* 2.4*    HbA1C: Hgb A1c MFr Bld  Date/Time Value Ref Range Status  03/05/2017 07:16 AM 6.0 (H) 4.8 - 5.6 % Final    Comment:    (NOTE) Pre diabetes:          5.7%-6.4% Diabetes:              >6.4% Glycemic control for   <7.0% adults with diabetes   02/14/2013 02:10 AM 6.3 (H) <5.7 % Final    Comment:    (NOTE)                                                                       According to the ADA Clinical Practice Recommendations for 2011, when HbA1c is used as a screening test:  >=6.5%   Diagnostic of Diabetes Mellitus           (if abnormal result is confirmed) 5.7-6.4%   Increased risk of developing Diabetes Mellitus References:Diagnosis and Classification of Diabetes Mellitus,Diabetes IPJA,2505,39(JQBHA 1):S62-S69 and Standards of Medical Care in         Diabetes - 2011,Diabetes Care,2011,34 (Suppl 1):S11-S61.     Recent Results (from the past 240 hour(s))  SARS Coronavirus 2 by RT PCR (hospital order, performed in [facility] hospital lab) Nasopharyngeal Nasopharyngeal Swab     Status: None   Collection Time: 07/23/19 11:36 PM   Specimen: Nasopharyngeal Swab  Result Value Ref Range Status   SARS Coronavirus 2 NEGATIVE NEGATIVE Final    Comment: (NOTE) SARS-CoV-2 target nucleic acids are NOT DETECTED.  The SARS-CoV-2 RNA is generally detectable in upper and lower respiratory specimens during the acute phase of infection. The lowest concentration of SARS-CoV-2 viral copies this assay can detect is 250 copies / mL. A negative result does not preclude SARS-CoV-2 infection and should not be used as the sole basis for treatment or other patient management decisions.  A negative result may occur with improper specimen collection / handling, submission of specimen other than nasopharyngeal swab, presence of viral mutation(s) within the areas targeted by this assay, and inadequate number of viral copies (<250 copies / mL). A negative result must  be combined with clinical observations, patient history, and epidemiological information.  Fact Sheet for Patients:   StrictlyIdeas.no  Fact Sheet for Healthcare Providers: BankingDealers.co.za  This test is not yet approved or  cleared by the [place] FDA and has been authorized for detection and/or diagnosis of SARS-CoV-2 by FDA under an Emergency Use Authorization (EUA).  This EUA will remain in effect (meaning this test can be used) for the duration of the COVID-19 declaration under Section 564(b)(1) of the Act, 21 U.S.C. section 360bbb-3(b)(1), unless the authorization is terminated or revoked sooner.  Performed at [facility], [address] [place], [place] [address]   Culture, blood (routine x 2)     Status: None (Preliminary result)   Collection Time: 07/24/19  3:59 AM   Specimen: BLOOD  Result Value Ref Range Status   Specimen Description   Final    BLOOD LEFT ANTECUBITAL Performed at [facility] [address], [place], [place] [address]    Special Requests   Final    BOTTLES DRAWN AEROBIC ONLY Blood Culture adequate volume Performed at [facility] [address], [place], [place] [address]    Culture   Final    NO GROWTH 3 DAYS Performed at [facility], [place] [address], [place], [place] [address]    Report Status PENDING  Incomplete  Culture, blood (routine x 2)     Status: None (Preliminary result)   Collection Time: 07/24/19  4:11 AM   Specimen: Right Antecubital; Blood  Result Value Ref Range Status   Specimen Description   Final    RIGHT ANTECUBITAL Performed at [facility] [address], [place], [place] [address]    Special Requests   Final    BACTERIAL CASTS Blood Culture adequate volume Performed at [facility] [address] [place], [place] [address]    Culture   Final    NO GROWTH 3  DAYS Performed at [facility], [place] [address] [place], [place] [address]    Report Status PENDING  Incomplete  Expectorated sputum assessment w rflx to resp cult     Status: None   Collection Time: 07/24/19 11:45 AM   Specimen: Sputum  Result Value Ref Range Status   Specimen Description SPUTUM  Final   Special Requests EXPECTORANT  Final   Sputum evaluation   Final    THIS SPECIMEN IS ACCEPTABLE FOR SPUTUM CULTURE Performed at [facility], [place] [address], [place], [place] [address]    Report Status 07/24/2019 FINAL  Final  Culture, respiratory     Status: None   Collection Time: 07/24/19 11:45 AM   Specimen: SPU  Result Value Ref Range Status   Specimen Description   Final    SPUTUM Performed at [facility] [address], [place], [place] [address]    Special Requests   Final    EXPECTORANT Reflexed from [id number] Performed at [facility], [place] [address] [place], [place] [address]    Gram Stain   Final    RARE WBC PRESENT, PREDOMINANTLY PMN FEW GRAM NEGATIVE RODS RARE GRAM POSITIVE COCCI IN PAIRS    Culture   Final    Consistent with normal respiratory flora. Performed at [facility], [place] [address], [place],  [address]    Report Status 07/26/2019 FINAL  Final     Scheduled Meds: . allopurinol  300 mg Oral QHS  . amLODipine  5 mg Oral QHS  . amoxicillin-clavulanate  1 tablet Oral BID  . arformoterol  15 mcg Nebulization BID  . budesonide (PULMICORT) nebulizer solution  0.5 mg Nebulization BID  . citalopram  20 mg Oral BID  . clopidogrel  75 mg Oral Daily  . donepezil  5 mg Oral Daily  . feeding supplement  1 Container Oral BID BM  . furosemide  80 mg Oral Daily  . guaiFENesin  1,200 mg Oral BID  . levothyroxine  25 mcg Oral Q0600  . losartan  25 mg Oral Daily  . memantine  10 mg Oral BID  . metoprolol succinate  25 mg Oral q morning - 10a  . predniSONE  10 mg Oral Q breakfast  .  warfarin  5 mg Oral q1600  . Warfarin - Physician Dosing Inpatient   Does not apply q1600     LOS: 5 days   Cherene Altes, MD [facility] Office  [phone] Pager - Text Page per Amion  If 7PM-7AM, please contact night-coverage per Amion 07/28/2019, 8:54 AM

## 2019-07-29 ENCOUNTER — Inpatient Hospital Stay (HOSPITAL_COMMUNITY): Payer: Medicare PPO

## 2019-07-29 LAB — CBC
HCT: 37.5 % (ref 36.0–46.0)
Hemoglobin: 12 g/dL (ref 12.0–15.0)
MCH: 29.2 pg (ref 26.0–34.0)
MCHC: 32 g/dL (ref 30.0–36.0)
MCV: 91.2 fL (ref 80.0–100.0)
Platelets: 250 10*3/uL (ref 150–400)
RBC: 4.11 MIL/uL (ref 3.87–5.11)
RDW: 14.6 % (ref 11.5–15.5)
WBC: 9.7 10*3/uL (ref 4.0–10.5)
nRBC: 0 % (ref 0.0–0.2)

## 2019-07-29 LAB — COMPREHENSIVE METABOLIC PANEL
ALT: 26 U/L (ref 0–44)
AST: 30 U/L (ref 15–41)
Albumin: 3.6 g/dL (ref 3.5–5.0)
Alkaline Phosphatase: 50 U/L (ref 38–126)
Anion gap: 9 (ref 5–15)
BUN: 13 mg/dL (ref 8–23)
CO2: 25 mmol/L (ref 22–32)
Calcium: 8.7 mg/dL — ABNORMAL LOW (ref 8.9–10.3)
Chloride: 103 mmol/L (ref 98–111)
Creatinine, Ser: 0.69 mg/dL (ref 0.44–1.00)
GFR calc Af Amer: >60 mL/min (ref >60)
GFR calc non Af Amer: >60 mL/min (ref >60)
Glucose, Bld: 106 mg/dL — ABNORMAL HIGH (ref 70–99)
Potassium: 3.4 mmol/L — ABNORMAL LOW (ref 3.5–5.1)
Sodium: 137 mmol/L (ref 135–145)
Total Bilirubin: 0.8 mg/dL (ref 0.3–1.2)
Total Protein: 6.3 g/dL — ABNORMAL LOW (ref 6.5–8.1)

## 2019-07-29 LAB — CULTURE, BLOOD (ROUTINE X 2)
Culture: NO GROWTH
Culture: NO GROWTH
Special Requests: ADEQUATE
Special Requests: ADEQUATE

## 2019-07-29 LAB — PROTIME-INR
INR: 2.7 — ABNORMAL HIGH (ref 0.8–1.2)
Prothrombin Time: 28 seconds — ABNORMAL HIGH (ref 11.4–15.2)

## 2019-07-29 MED ORDER — LOPERAMIDE HCL 2 MG PO CAPS
2.0000 mg | ORAL_CAPSULE | Freq: Once | ORAL | Status: AC
  Administered 2019-07-29: 2 mg via ORAL
  Filled 2019-07-29: qty 1

## 2019-07-29 MED ORDER — PREDNISONE 5 MG PO TABS
5.0000 mg | ORAL_TABLET | Freq: Every day | ORAL | Status: DC
  Administered 2019-07-30 – 2019-07-31 (×2): 5 mg via ORAL
  Filled 2019-07-29 (×2): qty 1

## 2019-07-29 MED ORDER — FLUTICASONE FUROATE-VILANTEROL 200-25 MCG/INH IN AEPB: 1.0000 | INHALATION_SPRAY | Freq: Every day | RESPIRATORY_TRACT | Status: DC

## 2019-07-29 MED ORDER — AMLODIPINE BESYLATE 10 MG PO TABS
10.0000 mg | ORAL_TABLET | Freq: Every day | ORAL | Status: DC
  Administered 2019-07-29 – 2019-08-03 (×6): 10 mg via ORAL
  Filled 2019-07-29 (×6): qty 1

## 2019-07-29 MED ORDER — FLUTICASONE FUROATE-VILANTEROL 200-25 MCG/INH IN AEPB
1.0000 | INHALATION_SPRAY | Freq: Every day | RESPIRATORY_TRACT | Status: DC
  Administered 2019-07-30 – 2019-07-31 (×2): 1 via RESPIRATORY_TRACT
  Filled 2019-07-29: qty 28

## 2019-07-29 MED ORDER — FUROSEMIDE 10 MG/ML IJ SOLN
40.0000 mg | Freq: Once | INTRAMUSCULAR | Status: AC
  Administered 2019-07-29: 40 mg via INTRAVENOUS
  Filled 2019-07-29: qty 4

## 2019-07-29 NOTE — Progress Notes (Signed)
Pt. Refused CPT at this time.  Daughter in room at time of refusal.

## 2019-07-29 NOTE — Progress Notes (Signed)
NAME:  Amber [name], MRN:  [id number], DOB:  08-27-1934, LOS: 6 ADMISSION DATE:  07/23/2019, CONSULTATION DATE:  07/24/2019 REFERRING MD:  Dr. Bonner Puna, [facility], CHIEF COMPLAINT:  Cough   Brief History   85 yo female former smoker with hx of COPD and asthma developed rhinitis, cough, dyspnea, lethargy starting on 07/20/19.  She was at recent family gathering and several family members developed upper respiratory infection symptoms.  She has received COVID vaccination and COVID PCR was negative.  CT chest showed findings concerning for aspiration.  PCCM consulted to assist with respiratory management.  Past Medical History  Osteoporosis, Osteoarthritis, Nasal polyps, Hypothyroidism, Diastolic CHF, HTN, HLD, Melanoma, CVA, Gout, Dementia, GERD, Fibromyalgia, Depression, CAD s/p CABG, CKD 3a, Chronic a fib, Cervical spinal stenossi  Consults:    Procedures:    Significant Diagnostic Tests:   Echo 02/09/17 >> EF 60 to 65%, mild AS, mild MS, mod LA dilation, mod TR, PAS 52 mmHg  PFT 05/13/17 >> FEV1 1.24 (66%), FEV1% 71, TLC 4.48 (90%), DLCO 56%, +BD  CT angio chest 07/23/19 >> patulous esophagus, debris in trachea extending to mainstem bronchi  Micro Data:  SARS CoV2 PCR 6/12 >> negative Sputum 6/13 >> oral flora Blood 6/13 >> negative RVP 6/14 >> Rhinovirus  Antimicrobials:  Unasyn 6/12 >> 6/15 Zithromax 6/12 >> 6/15 Augmentin 6/15 >> 6/17  Interim history/subjective:  Feels like breathing better.  Still has cough.  Still needing O2.  Objective   Blood pressure (!) 157/59, pulse 72, temperature 98.5 F (36.9 C), temperature source Oral, resp. rate 20, height 5\' 6"  (1.676 m), weight 54.4 kg, SpO2 92 %.        Intake/Output Summary (Last 24 hours) at 07/29/2019 1310 Last data filed at 07/29/2019 0900 Gross per 24 hour  Intake 660 ml  Output --  Net 660 ml   Filed Weights   07/23/19 1627  Weight: 54.4 kg    Examination:  General - alert Eyes - pupils reactive ENT - no  sinus tenderness, no stridor, raspy voice Cardiac - regular rate/rhythm, no murmur Chest - scattered rhonchi Abdomen - soft, non tender, + bowel sounds Extremities - no cyanosis, clubbing, or edema Skin - no rashes Neuro - follows commands Psych - normal mood and behavior  Assessment & Plan:   Acute hypoxic respiratory failure from tracheobronchitis and COPD/asthma exacerbation.. - getting home oxygen set up arranged  COPD/asthma exacerbation 2nd to Rhinoviral infection. - wean off prednisone as tolerated over next week - continue pulmicort, brovana, prn duoneb; can transition to LABA/ICS combination covered by her insurance as outpatient (breo, symbicort, wixela, advair, or dulera)  Chronic diastolic CHF, Chronic A fib. - follow I/O's   Hx of CVA with dementia and depression. - per primary team  Deconditioning. - family deciding between SNF and PT at home  Best practice:  Diet: regular DVT prophylaxis: Coumadin GI prophylaxis: Not indicated Mobility: As tolerated Code Status: DNR/DNI Family Communication: spoke with daughter at bedside  PCCM will follow up on 08/01/19 if she is still in hospital.  Advised her to daughter to arrange for outpatient pulmonary follow up if she gets discharged over the weekend.  Labs    CMP Latest Ref Rng & Units 07/29/2019 07/28/2019 07/26/2019  Glucose 70 - 99 mg/dL 106(H) 136(H) 113(H)  BUN 8 - 23 mg/dL 13 16 15   Creatinine 0.44 - 1.00 mg/dL 0.69 0.71 0.62  Sodium 135 - 145 mmol/L 137 137 138  Potassium 3.5 - 5.1 mmol/L 3.4(L)  3.7 3.7  Chloride 98 - 111 mmol/L 103 99 103  CO2 22 - 32 mmol/L 25 24 26   Calcium 8.9 - 10.3 mg/dL 8.7(L) 8.5(L) 8.0(L)  Total Protein 6.5 - 8.1 g/dL 6.3(L) - -  Total Bilirubin 0.3 - 1.2 mg/dL 0.8 - -  Alkaline Phos 38 - 126 U/L 50 - -  AST 15 - 41 U/L 30 - -  ALT 0 - 44 U/L 26 - -    CBC Latest Ref Rng & Units 07/29/2019 07/24/2019 07/23/2019  WBC 4.0 - 10.5 K/uL 9.7 9.8 8.5  Hemoglobin 12.0 - 15.0 g/dL 12.0  12.8 13.4  Hematocrit 36 - 46 % 37.5 40.4 42.5  Platelets 150 - 400 K/uL 250 174 176    Signature:   Chesley Mires, MD [place] Pager - [phone] 07/29/2019, 1:10 PM

## 2019-07-29 NOTE — Progress Notes (Signed)
Physical Therapy Treatment Patient Details Name: Amber [name] MRN: [id number] DOB: 1934/04/04 Today's Date: 07/29/2019    History of Present Illness 85 y.o. female with a history of chronic AFib on coumadin, stage IIIa CKD, COPD with emphysema, CAD, chronic HFpEF, HTN, hypothyroidism, GERD, osteoarthritis and fibromyalgia, moderate dementia, CVA with left weakness, fall down stairs in March 2021 resulting in left rib fx and T2, 6,9 compression fractures. Pt presented from home due to several days of increasing wet cough and dyspnea. On evaluation she was afebrile with hypoxia.  Pt admitted for Acute hypoxemic respiratory failure due to aspiration pneumonia, pneumonitis and tracheobronchitis on COPD without acute exacerbation    PT Comments    Pt tolerated gait training well, able to ambulate with RW, good steadiness on RA with cues for pursed lip breathing. Pt performs therapeutic exercise with verbal cues for form, cues for pursed lip breathing and desat to 87%; able to recover to 92% with cues for pursed lip breathing and rest break. Returned 2L O2 at EOS per RN orders due to fluctuating O2 sat. Patient will benefit from continued physical therapy in hospital and recommendations below to increase strength, balance, endurance for safe ADLs and gait.   Follow Up Recommendations  SNF;Supervision/Assistance - 24 hour     Equipment Recommendations  None recommended by PT    Recommendations for Other Services       Precautions / Restrictions Precautions Precautions: Fall Precaution Comments: L AFO, hx of L deficits since CVA; monitor sats Restrictions Weight Bearing Restrictions: No    Mobility  Bed Mobility  General bed mobility comments: pt in recliner  Transfers Overall transfer level: Needs assistance Equipment used: Rolling walker (2 wheeled) Transfers: Sit to/from Stand Sit to Stand: Min assist  General transfer comment: verbal cues to push from armrests, min A to power up,  cues to weight shift forward once standing  Ambulation/Gait Ambulation/Gait assistance: Min guard Gait Distance (Feet): 100 Feet Assistive device: Rolling walker (2 wheeled) Gait Pattern/deviations: Step-through pattern;Decreased stride length Gait velocity: decreased   General Gait Details: slow, steady steps with RW, increased work of breathing on RA with SpO2 89-93% while ambulating, cues for pursed lip breathing   Stairs             Wheelchair Mobility    Modified Rankin (Stroke Patients Only)       Balance Overall balance assessment: Needs assistance  Standing balance support: During functional activity;Bilateral upper extremity supported Standing balance-Leahy Scale: Poor Standing balance comment: reliant on RW       Cognition Arousal/Alertness: Awake/alert Behavior During Therapy: WFL for tasks assessed/performed Overall Cognitive Status: Within Functional Limits for tasks assessed        General Comments: pleasant, alert, confused but follows commands appropriately      Exercises General Exercises - Lower Extremity Quad Sets: Seated;Both;10 reps (LE extended on recliner) Long Arc Quad: Seated;Both;10 reps Hip ABduction/ADduction: Seated;Both;10 reps (LE extended on recliner) Hip Flexion/Marching: Seated;Both;10 reps    General Comments General comments (skin integrity, edema, etc.): on RA with SpO2 89-93% with amb, SpO2 87-92% with seated therapeutic exercises; returned 2L O2 at EOS per RN order      Pertinent Vitals/Pain Pain Assessment: No/denies pain    Home Living                      Prior Function            PT Goals (current goals can now be found  in the care plan section) Acute Rehab PT Goals Patient Stated Goal: home Progress towards PT goals: Progressing toward goals    Frequency    Min 3X/week      PT Plan Current plan remains appropriate    Co-evaluation              AM-PAC PT "6 Clicks" Mobility    Outcome Measure  Help needed turning from your back to your side while in a flat bed without using bedrails?: A Little Help needed moving from lying on your back to sitting on the side of a flat bed without using bedrails?: A Little Help needed moving to and from a bed to a chair (including a wheelchair)?: A Little Help needed standing up from a chair using your arms (e.g., wheelchair or bedside chair)?: A Little Help needed to walk in hospital room?: A Little Help needed climbing 3-5 steps with a railing? : A Lot 6 Click Score: 17    End of Session Equipment Utilized During Treatment: Gait belt;Oxygen Activity Tolerance: Patient tolerated treatment well Patient left: in chair;with call bell/phone within reach;with chair alarm set Nurse Communication: Mobility status PT Visit Diagnosis: Other abnormalities of gait and mobility (R26.89)     Time: 1828-8337 PT Time Calculation (min) (ACUTE ONLY): 35 min  Charges:  $Gait Training: 8-22 mins $Therapeutic Exercise: 8-22 mins                      Tori Evelyna Folker PT, DPT 07/29/19, 1:15 PM

## 2019-07-29 NOTE — Progress Notes (Signed)
Amber MARUT  [name]:[id number] DOB: [birth year] DOA: 07/23/2019 PCP: Antony Contras, MD    Brief Narrative:  85 year old with a remote history of CVA, chronic atrial fibrillation on Coumadin, stage IIIa CKD, COPD, CAD, chronic diastolic CHF, HTN, hypothyroidism, GERD, fibromyalgia, and moderate dementia who presented from home with several days of increasing wet cough and dyspnea.  On evaluation she was found to be hypoxic.  WBC was normal.  CT chest revealed significant debris and fluid collection in the airways suggesting mucous plugging.  Given full history there was a suspicion of aspiration pneumonitis.  Since her admission the patient has been seen by speech pathology and the pulmonary team.  Modified barium swallow revealed no evidence of significant dysphagia.  Respiratory virus panel revealed a rhinovirus.  Antimicrobials:  Unasyn 6/12 > 6/15 Azithromycin 6/12 > 6/14 Augmentin 6/15 > 6/16  Subjective: Appears to be stabilizing at this time. No new complaints today. Family reports breathing a bit more labored and crackly today.   Assessment & Plan:  Multifactorial acute hypoxic respiratory failure Likely a combination of all of the factors listed below - slight worsening of resp status this afternoon    Aspiration pneumonitis MBS 6/14 showed only limited evidence of aspiration with a recommendation for D3 diet - family has chosen to continue regular diet with acceptance of aspiration risk which I feel is appropriate appropriate-completed 5 days of Augmentin therapy empirically -I discussed with the patient's family the likelihood that she is suffering intermittent episodes of aspiration on a low-grade level which are likely contributing to her respiratory issues  Rhinovirus URI - tracheobronchitis Management of acute tracheobronchitis as per PCCM recommendations - this is likely to blame for much of her course upper airway sounds   Chronic diastolic CHF Appears mildly overloaded at  this time - increase diuretic transiently   Chronic atrial fibrillation Rate controlled with the use of metoprolol -continue Coumadin therapy with dosing per pharmacy  CAD Continue beta-blocker and Plavix -has reported allergy to statin  Gout Continue allopurinol  Dementia with depression Continue Aricept Namenda and SSRI  Hypokalemia  Hypothyroidism Continue usual Synthroid dose  DVT prophylaxis: Coumadin Code Status: NO CODE BLUE Family Communication: spoke w/ daughter at bedside  Status is: Inpatient  Remains inpatient appropriate because:Inpatient level of care appropriate due to severity of illness   Dispo: The patient is from: Home              Anticipated d/c is to: Home              Anticipated d/c date is: 2 days              Patient currently is not medically stable to d/c.  Consultants:  PCCM  Objective: Blood pressure (!) 157/59, pulse 72, temperature 98.5 F (36.9 C), temperature source Oral, resp. rate 20, height 5\' 6"  (1.676 m), weight 54.4 kg, SpO2 90 %.  Intake/Output Summary (Last 24 hours) at 07/29/2019 0835 Last data filed at 07/28/2019 2006 Gross per 24 hour  Intake 360 ml  Output --  Net 360 ml   Filed Weights   07/23/19 1627  Weight: 54.4 kg    Examination: General: No acute severe respiratory distress   Lungs: scattered fine crackles - wet sounding upper airway sounds  Cardiovascular: Regular rate without murmur or rub  Abdomen: Nontender, nondistended, soft, bs+ Extremities: trace B LE edema  CBC: Recent Labs  Lab 07/23/19 2017 07/24/19 0411 07/29/19 0438  WBC 8.5 9.8 9.7  NEUTROABS 5.9  --   --   HGB 13.4 12.8 12.0  HCT 42.5 40.4 37.5  MCV 94.4 94.2 91.2  PLT 176 174 010   Basic Metabolic Panel: Recent Labs  Lab 07/26/19 0430 07/28/19 0435 07/29/19 0438  NA 138 137 137  K 3.7 3.7 3.4*  CL 103 99 103  CO2 26 24 25   GLUCOSE 113* 136* 106*  BUN 15 16 13   CREATININE 0.62 0.71 0.69  CALCIUM 8.0* 8.5* 8.7*  MG 2.1   --   --    GFR: Estimated Creatinine Clearance: 46.6 mL/min (by C-G formula based on SCr of 0.69 mg/dL).  Liver Function Tests: Recent Labs  Lab 07/23/19 2017 07/29/19 0438  AST 60* 30  ALT 24 26  ALKPHOS 64 50  BILITOT 1.2 0.8  PROT 7.6 6.3*  ALBUMIN 4.7 3.6    Coagulation Profile: Recent Labs  Lab 07/24/19 0411 07/25/19 0426 07/26/19 0430 07/28/19 0435 07/29/19 0438  INR 2.2* 2.3* 2.0* 2.4* 2.7*    HbA1C: Hgb A1c MFr Bld  Date/Time Value Ref Range Status  03/05/2017 07:16 AM 6.0 (H) 4.8 - 5.6 % Final    Comment:    (NOTE) Pre diabetes:          5.7%-6.4% Diabetes:              >6.4% Glycemic control for   <7.0% adults with diabetes   02/14/2013 02:10 AM 6.3 (H) <5.7 % Final    Comment:    (NOTE)                                                                       According to the ADA Clinical Practice Recommendations for 2011, when HbA1c is used as a screening test:  >=6.5%   Diagnostic of Diabetes Mellitus           (if abnormal result is confirmed) 5.7-6.4%   Increased risk of developing Diabetes Mellitus References:Diagnosis and Classification of Diabetes Mellitus,Diabetes UVOZ,3664,40(HKVQQ 1):S62-S69 and Standards of Medical Care in         Diabetes - 2011,Diabetes Care,2011,34 (Suppl 1):S11-S61.     Recent Results (from the past 240 hour(s))  SARS Coronavirus 2 by RT PCR (hospital order, performed in [facility] hospital lab) Nasopharyngeal Nasopharyngeal Swab     Status: None   Collection Time: 07/23/19 11:36 PM   Specimen: Nasopharyngeal Swab  Result Value Ref Range Status   SARS Coronavirus 2 NEGATIVE NEGATIVE Final    Comment: (NOTE) SARS-CoV-2 target nucleic acids are NOT DETECTED.  The SARS-CoV-2 RNA is generally detectable in upper and lower respiratory specimens during the acute phase of infection. The lowest concentration of SARS-CoV-2 viral copies this assay can detect is 250 copies / mL. A negative result does not preclude  SARS-CoV-2 infection and should not be used as the sole basis for treatment or other patient management decisions.  A negative result may occur with improper specimen collection / handling, submission of specimen other than nasopharyngeal swab, presence of viral mutation(s) within the areas targeted by this assay, and inadequate number of viral copies (<250 copies / mL). A negative result must be combined with clinical observations, patient history, and epidemiological information.  Fact Sheet for Patients:  StrictlyIdeas.no  Fact Sheet for Healthcare Providers: BankingDealers.co.za  This test is not yet approved or  cleared by the [place] FDA and has been authorized for detection and/or diagnosis of SARS-CoV-2 by FDA under an Emergency Use Authorization (EUA).  This EUA will remain in effect (meaning this test can be used) for the duration of the COVID-19 declaration under Section 564(b)(1) of the Act, 21 U.S.C. section 360bbb-3(b)(1), unless the authorization is terminated or revoked sooner.  Performed at [facility], [place] [address], [place], [place] [address]   Culture, blood (routine x 2)     Status: None   Collection Time: 07/24/19  3:59 AM   Specimen: BLOOD  Result Value Ref Range Status   Specimen Description   Final    BLOOD LEFT ANTECUBITAL Performed at [facility] [address], [place], [place] [address]    Special Requests   Final    BOTTLES DRAWN AEROBIC ONLY Blood Culture adequate volume Performed at [facility] [address] [place], [place] [address]    Culture   Final    NO GROWTH 5 DAYS Performed at [facility], [place] [address], [place], [place] [address]    Report Status 07/29/2019 FINAL  Final  Culture, blood (routine x 2)     Status: None   Collection Time: 07/24/19  4:11 AM   Specimen: Right Antecubital; Blood  Result Value Ref  Range Status   Specimen Description   Final    RIGHT ANTECUBITAL Performed at [facility] [address] [place], [place] [address]    Special Requests   Final    BACTERIAL CASTS Blood Culture adequate volume Performed at [facility] [address], [place], [place] [address]    Culture   Final    NO GROWTH 5 DAYS Performed at [facility], [place] [address] [place], [place] [address]    Report Status 07/29/2019 FINAL  Final  Expectorated sputum assessment w rflx to resp cult     Status: None   Collection Time: 07/24/19 11:45 AM   Specimen: Sputum  Result Value Ref Range Status   Specimen Description SPUTUM  Final   Special Requests EXPECTORANT  Final   Sputum evaluation   Final    THIS SPECIMEN IS ACCEPTABLE FOR SPUTUM CULTURE Performed at [facility], [place] [address] [place], [place] [address]    Report Status 07/24/2019 FINAL  Final  Culture, respiratory     Status: None   Collection Time: 07/24/19 11:45 AM   Specimen: SPU  Result Value Ref Range Status   Specimen Description   Final    SPUTUM Performed at [facility] [address], [place], [place] [address]    Special Requests   Final    EXPECTORANT Reflexed from [id number] Performed at [facility], [place] [address], [place], [place] [address]    Gram Stain   Final    RARE WBC PRESENT, PREDOMINANTLY PMN FEW GRAM NEGATIVE RODS RARE GRAM POSITIVE COCCI IN PAIRS    Culture   Final    Consistent with normal respiratory flora. Performed at [facility], [place] [address] [place],  [address]    Report Status 07/26/2019 FINAL  Final     Scheduled Meds: . allopurinol  300 mg Oral QHS  . amLODipine  5 mg Oral QHS  . arformoterol  15 mcg Nebulization BID  . budesonide (  PULMICORT) nebulizer solution  0.5 mg Nebulization BID  . citalopram  20 mg Oral BID  . clopidogrel  75 mg Oral Daily  . donepezil  5 mg  Oral Daily  . feeding supplement  1 Container Oral BID BM  . furosemide  80 mg Oral Daily  . guaiFENesin  1,200 mg Oral BID  . levothyroxine  25 mcg Oral Q0600  . losartan  25 mg Oral Daily  . memantine  10 mg Oral BID  . metoprolol succinate  25 mg Oral q morning - 10a  . predniSONE  10 mg Oral Q breakfast  . warfarin  5 mg Oral q1600  . Warfarin - Physician Dosing Inpatient   Does not apply q1600     LOS: 6 days   Cherene Altes, MD [facility] Office  [phone] Pager - Text Page per Amion  If 7PM-7AM, please contact night-coverage per Amion 07/29/2019, 8:35 AM

## 2019-07-30 LAB — BASIC METABOLIC PANEL
Anion gap: 10 (ref 5–15)
BUN: 12 mg/dL (ref 8–23)
CO2: 27 mmol/L (ref 22–32)
Calcium: 8.3 mg/dL — ABNORMAL LOW (ref 8.9–10.3)
Chloride: 101 mmol/L (ref 98–111)
Creatinine, Ser: 0.63 mg/dL (ref 0.44–1.00)
GFR calc Af Amer: >60 mL/min (ref >60)
GFR calc non Af Amer: >60 mL/min (ref >60)
Glucose, Bld: 95 mg/dL (ref 70–99)
Potassium: 2.8 mmol/L — ABNORMAL LOW (ref 3.5–5.1)
Sodium: 138 mmol/L (ref 135–145)

## 2019-07-30 LAB — PROTIME-INR
INR: 2.9 — ABNORMAL HIGH (ref 0.8–1.2)
Prothrombin Time: 29.6 seconds — ABNORMAL HIGH (ref 11.4–15.2)

## 2019-07-30 LAB — CBC
HCT: 36.6 % (ref 36.0–46.0)
Hemoglobin: 11.9 g/dL — ABNORMAL LOW (ref 12.0–15.0)
MCH: 29.7 pg (ref 26.0–34.0)
MCHC: 32.5 g/dL (ref 30.0–36.0)
MCV: 91.3 fL (ref 80.0–100.0)
Platelets: 285 10*3/uL (ref 150–400)
RBC: 4.01 MIL/uL (ref 3.87–5.11)
RDW: 14.6 % (ref 11.5–15.5)
WBC: 8.2 10*3/uL (ref 4.0–10.5)
nRBC: 0 % (ref 0.0–0.2)

## 2019-07-30 MED ORDER — POTASSIUM CHLORIDE CRYS ER 20 MEQ PO TBCR
40.0000 meq | EXTENDED_RELEASE_TABLET | Freq: Three times a day (TID) | ORAL | Status: DC
  Administered 2019-07-30 – 2019-07-31 (×4): 40 meq via ORAL
  Filled 2019-07-30 (×4): qty 2

## 2019-07-30 NOTE — Progress Notes (Signed)
PT responded well with manual CPT followed up with [facility] (small, white, thin, frothy).

## 2019-07-30 NOTE — Progress Notes (Signed)
Amber [name]  YQI:[id number] DOB: [birth year] DOA: 07/23/2019 PCP: Antony Contras, MD    Brief Narrative:  85 year old with a remote history of CVA, chronic atrial fibrillation on Coumadin, stage IIIa CKD, COPD, CAD, chronic diastolic CHF, HTN, hypothyroidism, GERD, fibromyalgia, and moderate dementia who presented from home with several days of increasing wet cough and dyspnea.  On evaluation she was found to be hypoxic.  WBC was normal.  CT chest revealed significant debris and fluid collection in the airways suggesting mucous plugging.  Given full history there was a suspicion of aspiration pneumonitis.  Since her admission the patient has been seen by speech pathology and the pulmonary team.  Modified barium swallow revealed no evidence of significant dysphagia.  Respiratory virus panel revealed a rhinovirus.  Antimicrobials:  Unasyn 6/12 > 6/15 Azithromycin 6/12 > 6/14 Augmentin 6/15 > 6/16  Subjective: Sitting up in a bedside chair.  Appears comfortable.  No acute respiratory distress.  Coarse upper airway sounds continue to be audible.  She denies chest pain nausea vomiting or abdominal pain.  She remains pleasantly confused but interactive.  Assessment & Plan:  Multifactorial acute hypoxic respiratory failure Likely a combination of all of the factors listed below -appears to be stable from a respiratory standpoint at this time  Aspiration pneumonitis MBS 6/14 showed only limited evidence of aspiration with a recommendation for D3 diet - family has chosen to continue regular diet with acceptance of aspiration risk which I feel is appropriate appropriate-completed 5 days of Augmentin therapy empirically -I discussed with the patient's family the likelihood that she is suffering intermittent episodes of aspiration on a low-grade level which are likely contributing to her respiratory issues  Rhinovirus URI - tracheobronchitis Management of acute tracheobronchitis as per PCCM  recommendations - this is likely to blame for much of her course upper airway sounds -steroids being weaned  Chronic diastolic CHF Patient was given an extra dose of Lasix yesterday for very mild volume overload and states she feels her breathing is better because of that today  Chronic atrial fibrillation Rate controlled with the use of metoprolol -continue Coumadin therapy with dosing per pharmacy  CAD Continue beta-blocker and Plavix -has reported allergy to statin  Gout Continue allopurinol  Dementia with depression Continue Aricept Namenda and SSRI  Severe Hypokalemia Due to use of diuretic and poor oral intake -supplement aggressively today and follow-up in a.m.  Hypothyroidism Continue usual Synthroid dose  DVT prophylaxis: Coumadin Code Status: NO CODE BLUE Family Communication: spoke w/ daughter at bedside  Status is: Inpatient  Remains inpatient appropriate because:Inpatient level of care appropriate due to severity of illness   Dispo: The patient is from: Home              Anticipated d/c is to: Home              Anticipated d/c date is: If respiratory status remains stable and potassium is normalized the patient will likely be discharged home 6/20              Patient currently is not medically stable to d/c.  Consultants:  PCCM  Objective: Blood pressure (!) 162/63, pulse 70, temperature 98.2 F (36.8 C), temperature source Oral, resp. rate (!) 21, height 5\' 6"  (1.676 m), weight 54.4 kg, SpO2 92 %.  Intake/Output Summary (Last 24 hours) at 07/30/2019 0905 Last data filed at 07/30/2019 0618 Gross per 24 hour  Intake --  Output 400 ml  Net -400 ml  Filed Weights   07/23/19 1627  Weight: 54.4 kg    Examination: General: No acute severe respiratory distress at rest in bedside chair Lungs: scattered fine crackles - wet sounding upper airway sounds continue without change Cardiovascular: Regular rate without murmur  Abdomen: Nontender, nondistended,  soft, bs+ Extremities: No significant lower extremity edema  CBC: Recent Labs  Lab 07/23/19 2017 07/23/19 2017 07/24/19 0411 07/29/19 0438 07/30/19 0453  WBC 8.5   < > 9.8 9.7 8.2  NEUTROABS 5.9  --   --   --   --   HGB 13.4   < > 12.8 12.0 11.9*  HCT 42.5   < > 40.4 37.5 36.6  MCV 94.4   < > 94.2 91.2 91.3  PLT 176   < > 174 250 285   < > = values in this interval not displayed.   Basic Metabolic Panel: Recent Labs  Lab 07/26/19 0430 07/26/19 0430 07/28/19 0435 07/29/19 0438 07/30/19 0453  NA 138   < > 137 137 138  K 3.7   < > 3.7 3.4* 2.8*  CL 103   < > 99 103 101  CO2 26   < > 24 25 27   GLUCOSE 113*   < > 136* 106* 95  BUN 15   < > 16 13 12   CREATININE 0.62   < > 0.71 0.69 0.63  CALCIUM 8.0*   < > 8.5* 8.7* 8.3*  MG 2.1  --   --   --   --    < > = values in this interval not displayed.   GFR: Estimated Creatinine Clearance: 46.6 mL/min (by C-G formula based on SCr of 0.63 mg/dL).  Liver Function Tests: Recent Labs  Lab 07/23/19 2017 07/29/19 0438  AST 60* 30  ALT 24 26  ALKPHOS 64 50  BILITOT 1.2 0.8  PROT 7.6 6.3*  ALBUMIN 4.7 3.6    Coagulation Profile: Recent Labs  Lab 07/25/19 0426 07/26/19 0430 07/28/19 0435 07/29/19 0438 07/30/19 0453  INR 2.3* 2.0* 2.4* 2.7* 2.9*    HbA1C: Hgb A1c MFr Bld  Date/Time Value Ref Range Status  03/05/2017 07:16 AM 6.0 (H) 4.8 - 5.6 % Final    Comment:    (NOTE) Pre diabetes:          5.7%-6.4% Diabetes:              >6.4% Glycemic control for   <7.0% adults with diabetes   02/14/2013 02:10 AM 6.3 (H) <5.7 % Final    Comment:    (NOTE)                                                                       According to the ADA Clinical Practice Recommendations for 2011, when HbA1c is used as a screening test:  >=6.5%   Diagnostic of Diabetes Mellitus           (if abnormal result is confirmed) 5.7-6.4%   Increased risk of developing Diabetes Mellitus References:Diagnosis and Classification of  Diabetes Mellitus,Diabetes GMWN,0272,53(GUYQI 1):S62-S69 and Standards of Medical Care in         Diabetes - 2011,Diabetes Care,2011,34 (Suppl 1):S11-S61.     Recent Results (from the past 240 hour(s))  SARS  Coronavirus 2 by RT PCR (hospital order, performed in [facility] hospital lab) Nasopharyngeal Nasopharyngeal Swab     Status: None   Collection Time: 07/23/19 11:36 PM   Specimen: Nasopharyngeal Swab  Result Value Ref Range Status   SARS Coronavirus 2 NEGATIVE NEGATIVE Final    Comment: (NOTE) SARS-CoV-2 target nucleic acids are NOT DETECTED.  The SARS-CoV-2 RNA is generally detectable in upper and lower respiratory specimens during the acute phase of infection. The lowest concentration of SARS-CoV-2 viral copies this assay can detect is 250 copies / mL. A negative result does not preclude SARS-CoV-2 infection and should not be used as the sole basis for treatment or other patient management decisions.  A negative result may occur with improper specimen collection / handling, submission of specimen other than nasopharyngeal swab, presence of viral mutation(s) within the areas targeted by this assay, and inadequate number of viral copies (<250 copies / mL). A negative result must be combined with clinical observations, patient history, and epidemiological information.  Fact Sheet for Patients:   StrictlyIdeas.no  Fact Sheet for Healthcare Providers: BankingDealers.co.za  This test is not yet approved or  cleared by the [place] FDA and has been authorized for detection and/or diagnosis of SARS-CoV-2 by FDA under an Emergency Use Authorization (EUA).  This EUA will remain in effect (meaning this test can be used) for the duration of the COVID-19 declaration under Section 564(b)(1) of the Act, 21 U.S.C. section 360bbb-3(b)(1), unless the authorization is terminated or revoked sooner.  Performed at [facility], [place] [address], [place], [place] [address]   Culture, blood (routine x 2)     Status: None   Collection Time: 07/24/19  3:59 AM   Specimen: BLOOD  Result Value Ref Range Status   Specimen Description   Final    BLOOD LEFT ANTECUBITAL Performed at [place] [address] [place], [place] [address]    Special Requests   Final    BOTTLES DRAWN AEROBIC ONLY Blood Culture adequate volume Performed at [facility] [address] [place], [place] [address]    Culture   Final    NO GROWTH 5 DAYS Performed at [facility], [place] [address] [place], [place] [address]    Report Status 07/29/2019 FINAL  Final  Culture, blood (routine x 2)     Status: None   Collection Time: 07/24/19  4:11 AM   Specimen: Right Antecubital; Blood  Result Value Ref Range Status   Specimen Description   Final    RIGHT ANTECUBITAL Performed at [facility] [address], [place], [place] [address]    Special Requests   Final    BACTERIAL CASTS Blood Culture adequate volume Performed at [place] [address] [place], [place] [address]    Culture   Final    NO GROWTH 5 DAYS Performed at [facility], [place] [address], [place], [place] [address]    Report Status 07/29/2019 FINAL  Final  Expectorated sputum assessment w rflx to resp cult     Status: None   Collection Time: 07/24/19 11:45 AM   Specimen: Sputum  Result Value Ref Range Status   Specimen Description SPUTUM  Final   Special Requests EXPECTORANT  Final   Sputum evaluation   Final    THIS SPECIMEN IS ACCEPTABLE FOR SPUTUM CULTURE Performed at [facility], [place] [address], [place], [place] [address]  Report Status 07/24/2019 FINAL  Final  Culture, respiratory     Status: None   Collection Time: 07/24/19 11:45 AM   Specimen: SPU  Result Value Ref Range Status   Specimen Description   Final    SPUTUM Performed  at [facility] [address], [place], [place] [address]    Special Requests   Final    EXPECTORANT Reflexed from [phone] [id number] Performed at [facility], [place] [address] [place], [place] [address]    Gram Stain   Final    RARE WBC PRESENT, PREDOMINANTLY PMN FEW GRAM NEGATIVE RODS RARE GRAM POSITIVE COCCI IN PAIRS    Culture   Final    Consistent with normal respiratory flora. Performed at [facility], [place] [address] [place], [place] [address]    Report Status 07/26/2019 FINAL  Final     Scheduled Meds: . allopurinol  300 mg Oral QHS  . amLODipine  10 mg Oral QHS  . citalopram  20 mg Oral BID  . clopidogrel  75 mg Oral Daily  . donepezil  5 mg Oral Daily  . feeding supplement  1 Container Oral BID BM  . fluticasone furoate-vilanterol  1 puff Inhalation Daily  . furosemide  80 mg Oral Daily  . guaiFENesin  1,200 mg Oral BID  . levothyroxine  25 mcg Oral Q0600  . losartan  25 mg Oral Daily  . memantine  10 mg Oral BID  . metoprolol succinate  25 mg Oral q morning - 10a  . predniSONE  5 mg Oral Q breakfast  . warfarin  5 mg Oral q1600  . Warfarin - Physician Dosing Inpatient   Does not apply q1600     LOS: 7 days   Cherene Altes, MD [facility] Office  [phone] Pager - Text Page per Amion  If 7PM-7AM, please contact night-coverage per Amion 07/30/2019, 9:05 AM

## 2019-07-31 LAB — BASIC METABOLIC PANEL
Anion gap: 6 (ref 5–15)
BUN: 15 mg/dL (ref 8–23)
CO2: 25 mmol/L (ref 22–32)
Calcium: 8.6 mg/dL — ABNORMAL LOW (ref 8.9–10.3)
Chloride: 106 mmol/L (ref 98–111)
Creatinine, Ser: 0.68 mg/dL (ref 0.44–1.00)
GFR calc Af Amer: >60 mL/min (ref >60)
GFR calc non Af Amer: >60 mL/min (ref >60)
Glucose, Bld: 99 mg/dL (ref 70–99)
Potassium: 4.2 mmol/L (ref 3.5–5.1)
Sodium: 137 mmol/L (ref 135–145)

## 2019-07-31 LAB — PROTIME-INR
INR: 2.9 — ABNORMAL HIGH (ref 0.8–1.2)
Prothrombin Time: 29.4 seconds — ABNORMAL HIGH (ref 11.4–15.2)

## 2019-07-31 LAB — MAGNESIUM: Magnesium: 2.5 mg/dL — ABNORMAL HIGH (ref 1.7–2.4)

## 2019-07-31 MED ORDER — PREDNISONE 5 MG PO TABS
5.0000 mg | ORAL_TABLET | Freq: Once | ORAL | Status: AC
  Administered 2019-07-31: 5 mg via ORAL
  Filled 2019-07-31: qty 1

## 2019-07-31 MED ORDER — FUROSEMIDE 10 MG/ML IJ SOLN
80.0000 mg | Freq: Two times a day (BID) | INTRAMUSCULAR | Status: DC
  Administered 2019-07-31 – 2019-08-02 (×5): 80 mg via INTRAVENOUS
  Filled 2019-07-31 (×5): qty 8

## 2019-07-31 MED ORDER — POTASSIUM CHLORIDE CRYS ER 20 MEQ PO TBCR
40.0000 meq | EXTENDED_RELEASE_TABLET | Freq: Two times a day (BID) | ORAL | Status: DC
  Administered 2019-07-31 – 2019-08-02 (×4): 40 meq via ORAL
  Filled 2019-07-31 (×4): qty 2

## 2019-07-31 MED ORDER — PREDNISONE 5 MG PO TABS
10.0000 mg | ORAL_TABLET | Freq: Every day | ORAL | Status: DC
  Administered 2019-08-01 – 2019-08-04 (×4): 10 mg via ORAL
  Filled 2019-07-31 (×4): qty 2

## 2019-07-31 MED ORDER — BUDESONIDE 0.5 MG/2ML IN SUSP
0.5000 mg | Freq: Two times a day (BID) | RESPIRATORY_TRACT | Status: DC
  Administered 2019-07-31 – 2019-08-04 (×8): 0.5 mg via RESPIRATORY_TRACT
  Filled 2019-07-31 (×8): qty 2

## 2019-07-31 MED ORDER — ALBUTEROL SULFATE (2.5 MG/3ML) 0.083% IN NEBU: 2.5000 mg | INHALATION_SOLUTION | RESPIRATORY_TRACT | Status: DC | PRN

## 2019-07-31 MED ORDER — IPRATROPIUM-ALBUTEROL 0.5-2.5 (3) MG/3ML IN SOLN
3.0000 mL | Freq: Four times a day (QID) | RESPIRATORY_TRACT | Status: DC
  Administered 2019-07-31 – 2019-08-02 (×7): 3 mL via RESPIRATORY_TRACT
  Filled 2019-07-31 (×7): qty 3

## 2019-07-31 NOTE — Progress Notes (Signed)
Pt. declined CPT via Vest @ this time, remains up in chair, Aerosol nebs given, Flutter done after nebs.

## 2019-07-31 NOTE — Progress Notes (Signed)
Amber [name]  QJF:[id number] DOB: [birth year] DOA: 07/23/2019 PCP: Antony Contras, MD    Brief Narrative:  85 year old with a remote history of CVA, chronic atrial fibrillation on Coumadin, stage IIIa CKD, COPD, CAD, chronic diastolic CHF, HTN, hypothyroidism, GERD, fibromyalgia, and moderate dementia who presented from home with several days of increasing wet cough and dyspnea.  On evaluation she was found to be hypoxic.  WBC was normal.  CT chest revealed significant debris and fluid collection in the airways suggesting mucous plugging.  Given full history there was a suspicion of aspiration pneumonitis.  Since her admission the patient has been seen by speech pathology and the pulmonary team.  Modified barium swallow revealed no evidence of significant dysphagia.  Respiratory virus panel revealed a rhinovirus.  Antimicrobials:  Unasyn 6/12 > 6/15 Azithromycin 6/12 > 6/14 Augmentin 6/15 > 6/16  Subjective: The patient developed significant worsening of her respiratory status overnight. At the time of my exam today she is wheezing significantly more than yesterday. Her upper airway sounds are very wearing chronically. She states she feels significantly more short of breath. She denies chest pain nausea vomiting abdominal pain. She is alert and conversant though confused, as per her baseline.  Assessment & Plan:  Multifactorial acute hypoxic respiratory failure Likely a combination of all of the factors listed below -acute worsening over the last 24 hours appears to be a recurrent exacerbation of bronchospasm plus or minus pulmonary edema  Aspiration pneumonitis MBS 6/14 showed only limited evidence of aspiration with a recommendation for D3 diet - family has chosen to continue regular diet with acceptance of aspiration risk which I feel is appropriate appropriate-completed 5 days of Augmentin therapy empirically -I discussed with the patient's family the likelihood that she is suffering  intermittent episodes of aspiration on a low-grade level which are likely contributing to her respiratory issues  Rhinovirus URI - tracheobronchitis Management of acute tracheobronchitis as per PCCM recommendations  Chronic diastolic CHF Temporarily increase Lasix dose and monitor with hopes of affecting a significant diuresis -follow strict I's and O's and daily weights  Acute bronchospastic COPD exacerbation Steroid dose was decreased yesterday and the patient was transitioned to inhalers as opposed to nebulizers -concurrent with this the patient has developed significant worsening of her wheezing and respiratory difficulty -I will increase her steroid dose transiently, diurese her, and return to nebulizers  Chronic atrial fibrillation Rate controlled with the use of metoprolol -continue Coumadin therapy with dosing per pharmacy  CAD Continue beta-blocker and Plavix -has reported allergy to statin  Gout Continue allopurinol  Dementia with depression Continue Aricept Namenda and SSRI  Severe Hypokalemia Due to use of diuretic and poor oral intake -supplement aggressively today and follow-up in a.m.  Hypothyroidism Continue usual Synthroid dose  DVT prophylaxis: Coumadin Code Status: NO CODE BLUE Family Communication: spoke w/ daughter at bedside  Status is: Inpatient  Remains inpatient appropriate because:Inpatient level of care appropriate due to severity of illness   Dispo: The patient is from: Home              Anticipated d/c is to: Home              Anticipated d/c date is: Not stable for discharge home due to recurrent acute bronchospastic respiratory distress              Patient currently is not medically stable to d/c.  Consultants:  PCCM  Objective: Blood pressure (!) 151/57, pulse 76, temperature 98 F (36.7  C), temperature source Oral, resp. rate 16, height 5\' 6"  (1.676 m), weight 55.6 kg, SpO2 93 %.  Intake/Output Summary (Last 24 hours) at 07/31/2019  1442 Last data filed at 07/31/2019 7342 Gross per 24 hour  Intake 480 ml  Output 950 ml  Net -470 ml   Filed Weights   07/23/19 1627 07/31/19 0614  Weight: 54.4 kg 55.6 kg    Examination: General: Mild but worsening acute respiratory distress with significant bronchospasm Lungs: Coarse wet sounding upper airway sounds transmitted throughout with clear worsening expiratory wheezing Cardiovascular: Regular rate without murmur  Abdomen: Nontender, nondistended, soft, bs+ Extremities: Trace lower extremity edema bilateral lower extremities  CBC: Recent Labs  Lab 07/29/19 0438 07/30/19 0453  WBC 9.7 8.2  HGB 12.0 11.9*  HCT 37.5 36.6  MCV 91.2 91.3  PLT 250 876   Basic Metabolic Panel: Recent Labs  Lab 07/26/19 0430 07/28/19 0435 07/29/19 0438 07/30/19 0453 07/31/19 0656  NA 138   < > 137 138 137  K 3.7   < > 3.4* 2.8* 4.2  CL 103   < > 103 101 106  CO2 26   < > 25 27 25   GLUCOSE 113*   < > 106* 95 99  BUN 15   < > 13 12 15   CREATININE 0.62   < > 0.69 0.63 0.68  CALCIUM 8.0*   < > 8.7* 8.3* 8.6*  MG 2.1  --   --   --  2.5*   < > = values in this interval not displayed.   GFR: Estimated Creatinine Clearance: 47.6 mL/min (by C-G formula based on SCr of 0.68 mg/dL).  Liver Function Tests: Recent Labs  Lab 07/29/19 0438  AST 30  ALT 26  ALKPHOS 50  BILITOT 0.8  PROT 6.3*  ALBUMIN 3.6    Coagulation Profile: Recent Labs  Lab 07/26/19 0430 07/28/19 0435 07/29/19 0438 07/30/19 0453 07/31/19 0656  INR 2.0* 2.4* 2.7* 2.9* 2.9*    HbA1C: Hgb A1c MFr Bld  Date/Time Value Ref Range Status  03/05/2017 07:16 AM 6.0 (H) 4.8 - 5.6 % Final    Comment:    (NOTE) Pre diabetes:          5.7%-6.4% Diabetes:              >6.4% Glycemic control for   <7.0% adults with diabetes   02/14/2013 02:10 AM 6.3 (H) <5.7 % Final    Comment:    (NOTE)                                                                       According to the ADA Clinical Practice  Recommendations for 2011, when HbA1c is used as a screening test:  >=6.5%   Diagnostic of Diabetes Mellitus           (if abnormal result is confirmed) 5.7-6.4%   Increased risk of developing Diabetes Mellitus References:Diagnosis and Classification of Diabetes Mellitus,Diabetes OTLX,7262,03(TDHRC 1):S62-S69 and Standards of Medical Care in         Diabetes - 2011,Diabetes Care,2011,34 (Suppl 1):S11-S61.     Recent Results (from the past 240 hour(s))  SARS Coronavirus 2 by RT PCR (hospital order, performed in [facility] hospital lab) Nasopharyngeal Nasopharyngeal  Swab     Status: None   Collection Time: 07/23/19 11:36 PM   Specimen: Nasopharyngeal Swab  Result Value Ref Range Status   SARS Coronavirus 2 NEGATIVE NEGATIVE Final    Comment: (NOTE) SARS-CoV-2 target nucleic acids are NOT DETECTED.  The SARS-CoV-2 RNA is generally detectable in upper and lower respiratory specimens during the acute phase of infection. The lowest concentration of SARS-CoV-2 viral copies this assay can detect is 250 copies / mL. A negative result does not preclude SARS-CoV-2 infection and should not be used as the sole basis for treatment or other patient management decisions.  A negative result may occur with improper specimen collection / handling, submission of specimen other than nasopharyngeal swab, presence of viral mutation(s) within the areas targeted by this assay, and inadequate number of viral copies (<250 copies / mL). A negative result must be combined with clinical observations, patient history, and epidemiological information.  Fact Sheet for Patients:   StrictlyIdeas.no  Fact Sheet for Healthcare Providers: BankingDealers.co.za  This test is not yet approved or  cleared by the [place] FDA and has been authorized for detection and/or diagnosis of SARS-CoV-2 by FDA under an Emergency Use Authorization (EUA).  This EUA will remain in  effect (meaning this test can be used) for the duration of the COVID-19 declaration under Section 564(b)(1) of the Act, 21 U.S.C. section 360bbb-3(b)(1), unless the authorization is terminated or revoked sooner.  Performed at [facility], [place] [address], [place], [place] [address]   Culture, blood (routine x 2)     Status: None   Collection Time: 07/24/19  3:59 AM   Specimen: BLOOD  Result Value Ref Range Status   Specimen Description   Final    BLOOD LEFT ANTECUBITAL Performed at [facility] [address], [place], [place] [address]    Special Requests   Final    BOTTLES DRAWN AEROBIC ONLY Blood Culture adequate volume Performed at [facility] [address], [place], [place] [address]    Culture   Final    NO GROWTH 5 DAYS Performed at [facility], [place] [address] [place], [place] [address]    Report Status 07/29/2019 FINAL  Final  Culture, blood (routine x 2)     Status: None   Collection Time: 07/24/19  4:11 AM   Specimen: Right Antecubital; Blood  Result Value Ref Range Status   Specimen Description   Final    RIGHT ANTECUBITAL Performed at [facility] [address] [place], [place] [address]    Special Requests   Final    BACTERIAL CASTS Blood Culture adequate volume Performed at [facility] [address] [place], [place] [address]    Culture   Final    NO GROWTH 5 DAYS Performed at [facility], [place] [address], [place], [place] [address]    Report Status 07/29/2019 FINAL  Final  Expectorated sputum assessment w rflx to resp cult     Status: None   Collection Time: 07/24/19 11:45 AM   Specimen: Sputum  Result Value Ref Range Status   Specimen Description SPUTUM  Final   Special Requests EXPECTORANT  Final   Sputum evaluation   Final    THIS SPECIMEN IS ACCEPTABLE FOR SPUTUM CULTURE Performed at [facility], [place]  [address], [place], [place] [address]    Report Status 07/24/2019 FINAL  Final  Culture, respiratory  Status: None   Collection Time: 07/24/19 11:45 AM   Specimen: SPU  Result Value Ref Range Status   Specimen Description   Final    SPUTUM Performed at [facility] [address] [place], [place] [address]    Special Requests   Final    EXPECTORANT Reflexed from [id number] Performed at [facility], [place] [address], [place], [place] [address]    Gram Stain   Final    RARE WBC PRESENT, PREDOMINANTLY PMN FEW GRAM NEGATIVE RODS RARE GRAM POSITIVE COCCI IN PAIRS    Culture   Final    Consistent with normal respiratory flora. Performed at [facility], [place] [address] [place], [place] [address]    Report Status 07/26/2019 FINAL  Final     Scheduled Meds: . allopurinol  300 mg Oral QHS  . amLODipine  10 mg Oral QHS  . citalopram  20 mg Oral BID  . clopidogrel  75 mg Oral Daily  . donepezil  5 mg Oral Daily  . feeding supplement  1 Container Oral BID BM  . fluticasone furoate-vilanterol  1 puff Inhalation Daily  . furosemide  80 mg Oral Daily  . guaiFENesin  1,200 mg Oral BID  . levothyroxine  25 mcg Oral Q0600  . losartan  25 mg Oral Daily  . memantine  10 mg Oral BID  . metoprolol succinate  25 mg Oral q morning - 10a  . potassium chloride  40 mEq Oral TID  . predniSONE  5 mg Oral Q breakfast  . warfarin  5 mg Oral q1600  . Warfarin - Physician Dosing Inpatient   Does not apply q1600     LOS: 8 days   Cherene Altes, MD [facility] Office  [phone] Pager - Text Page per Amion  If 7PM-7AM, please contact night-coverage per Amion 07/31/2019, 2:42 PM

## 2019-08-01 ENCOUNTER — Inpatient Hospital Stay (HOSPITAL_COMMUNITY): Payer: Medicare PPO

## 2019-08-01 LAB — COMPREHENSIVE METABOLIC PANEL
ALT: 48 U/L — ABNORMAL HIGH (ref 0–44)
AST: 49 U/L — ABNORMAL HIGH (ref 15–41)
Albumin: 3.6 g/dL (ref 3.5–5.0)
Alkaline Phosphatase: 50 U/L (ref 38–126)
Anion gap: 15 (ref 5–15)
BUN: 15 mg/dL (ref 8–23)
CO2: 24 mmol/L (ref 22–32)
Calcium: 8.9 mg/dL (ref 8.9–10.3)
Chloride: 97 mmol/L — ABNORMAL LOW (ref 98–111)
Creatinine, Ser: 0.69 mg/dL (ref 0.44–1.00)
GFR calc Af Amer: >60 mL/min (ref >60)
GFR calc non Af Amer: >60 mL/min (ref >60)
Glucose, Bld: 106 mg/dL — ABNORMAL HIGH (ref 70–99)
Potassium: 4.1 mmol/L (ref 3.5–5.1)
Sodium: 136 mmol/L (ref 135–145)
Total Bilirubin: 0.7 mg/dL (ref 0.3–1.2)
Total Protein: 6.3 g/dL — ABNORMAL LOW (ref 6.5–8.1)

## 2019-08-01 LAB — PROTIME-INR
INR: 2.8 — ABNORMAL HIGH (ref 0.8–1.2)
Prothrombin Time: 28.4 seconds — ABNORMAL HIGH (ref 11.4–15.2)

## 2019-08-01 LAB — CBC
HCT: 36.1 % (ref 36.0–46.0)
Hemoglobin: 11.6 g/dL — ABNORMAL LOW (ref 12.0–15.0)
MCH: 29.2 pg (ref 26.0–34.0)
MCHC: 32.1 g/dL (ref 30.0–36.0)
MCV: 90.9 fL (ref 80.0–100.0)
Platelets: 339 10*3/uL (ref 150–400)
RBC: 3.97 MIL/uL (ref 3.87–5.11)
RDW: 14.8 % (ref 11.5–15.5)
WBC: 9.1 10*3/uL (ref 4.0–10.5)
nRBC: 0 % (ref 0.0–0.2)

## 2019-08-01 LAB — MAGNESIUM: Magnesium: 2.3 mg/dL (ref 1.7–2.4)

## 2019-08-01 NOTE — Care Management Important Message (Signed)
Important Message  Patient Details IM Letter given to Marney Doctor RN Case Manager to present to the Patient Name: Amber [name] MRN: [id number] Date of Birth: 1934-09-01   Medicare Important Message Given:  Yes     Kerin Salen 08/01/2019, 10:08 AM

## 2019-08-01 NOTE — Progress Notes (Signed)
NAME:  Amber [name], MRN:  [id number], DOB:  31-Mar-1934, LOS: 9 ADMISSION DATE:  07/23/2019, CONSULTATION DATE:  07/24/2019 REFERRING MD:  Dr. Bonner Puna, [facility], CHIEF COMPLAINT:  Cough   Brief History   85 yo female former smoker with hx of COPD and asthma developed rhinitis, cough, dyspnea, lethargy starting on 07/20/19.  She was at recent family gathering and several family members developed upper respiratory infection symptoms.  She has received COVID vaccination and COVID PCR was negative.  CT chest showed findings concerning for aspiration.  PCCM consulted to assist with respiratory management.  Past Medical History  Osteoporosis, Osteoarthritis, Nasal polyps, Hypothyroidism, Diastolic CHF, HTN, HLD, Melanoma, CVA, Gout, Dementia, GERD, Fibromyalgia, Depression, CAD s/p CABG, CKD 3a, Chronic a fib, Cervical spinal stenossi  Consults:    Procedures:    Significant Diagnostic Tests:   Echo 02/09/17 >> EF 60 to 65%, mild AS, mild MS, mod LA dilation, mod TR, PAS 52 mmHg  PFT 05/13/17 >> FEV1 1.24 (66%), FEV1% 71, TLC 4.48 (90%), DLCO 56%, +BD  CT angio chest 07/23/19 >> patulous esophagus, debris in trachea extending to mainstem bronchi  Micro Data:  SARS CoV2 PCR 6/12 >> negative Sputum 6/13 >> oral flora Blood 6/13 >> negative RVP 6/14 >> Rhinovirus  Antimicrobials:  Unasyn 6/12 >> 6/15 Zithromax 6/12 >> 6/15 Augmentin 6/15 >> 6/17  Interim history/subjective:  On room air Daughter reports that the nights remain difficult  Voice quality improved   Objective   Blood pressure 137/62, pulse 74, temperature 97.9 F (36.6 C), temperature source Oral, resp. rate 18, height 5\' 6"  (1.676 m), weight 55.6 kg, SpO2 97 %.        Intake/Output Summary (Last 24 hours) at 08/01/2019 1309 Last data filed at 08/01/2019 0404 Gross per 24 hour  Intake 240 ml  Output 1500 ml  Net -1260 ml   Filed Weights   07/23/19 1627 07/31/19 0614  Weight: 54.4 kg 55.6 kg     Examination: General: frail elderly female sitting up in bed in NAD  HEENT: MM pink/moist, no jvd, voice improved Neuro: Awake, alert, interacts appropriately  CV: s1s2 rrr, no m/r/g PULM:  Non-labored on RA, lungs bilaterally with scattered rhonchi (improved), upper airway noise, ? Some degree of functional vocal cord noise with forced expiration  GI: soft, bsx4 active  Extremities: warm/dry, trace LE edema, LLE brace   Skin: no rashes or lesions  Assessment & Plan:   Acute hypoxic respiratory failure from tracheobronchitis and COPD/asthma exacerbation. Former Dr. [place] Bells patient  -weaned off O2  -repeat CXR now, plan for repeat in office at time of follow up -hospital follow up arranged for 7/9 at 2:30 PM with Wyn Quaker, NP  COPD/asthma exacerbation 2nd to Rhinoviral infection. -prednisone tapered to 10 mg, wean to off  -continue pulmicort, brovana, PRN duoneb -no role abx at this time -for discharge > transition to LABA / ICS combination covered by her insurance (breo, symbicort, advair, dulera or wixela)  Chronic diastolic CHF, Chronic A fib. -per primary  -follow I/O's   Hx of CVA with dementia and depression. -per primary   Deconditioning. -family decided for home with PT  -note PT recommendations for SNF /  24 hour assistance   Best practice:  Diet: regular DVT prophylaxis: Coumadin GI prophylaxis: Not indicated Mobility: As tolerated Code Status: DNR/DNI Family Communication: Daughter updated at bedside 6/21 on plan of care.     PCCM will be available PRN.  Please call back if new  needs arise.    Labs    CMP Latest Ref Rng & Units 08/01/2019 07/31/2019 07/30/2019  Glucose 70 - 99 mg/dL 106(H) 99 95  BUN 8 - 23 mg/dL 15 15 12   Creatinine 0.44 - 1.00 mg/dL 0.69 0.68 0.63  Sodium 135 - 145 mmol/L 136 137 138  Potassium 3.5 - 5.1 mmol/L 4.1 4.2 2.8(L)  Chloride 98 - 111 mmol/L 97(L) 106 101  CO2 22 - 32 mmol/L 24 25 27   Calcium 8.9 - 10.3 mg/dL 8.9  8.6(L) 8.3(L)  Total Protein 6.5 - 8.1 g/dL 6.3(L) - -  Total Bilirubin 0.3 - 1.2 mg/dL 0.7 - -  Alkaline Phos 38 - 126 U/L 50 - -  AST 15 - 41 U/L 49(H) - -  ALT 0 - 44 U/L 48(H) - -    CBC Latest Ref Rng & Units 08/01/2019 07/30/2019 07/29/2019  WBC 4.0 - 10.5 K/uL 9.1 8.2 9.7  Hemoglobin 12.0 - 15.0 g/dL 11.6(L) 11.9(L) 12.0  Hematocrit 36 - 46 % 36.1 36.6 37.5  Platelets 150 - 400 K/uL 339 285 250    Signature:   Noe Gens, MSN, NP-C [facility] 08/01/2019, 1:09 PM   Please see [email or web address] for pager details.

## 2019-08-01 NOTE — Progress Notes (Signed)
Amber HALTIWANGER  XBM:[id number] DOB: 27-Apr-1934 DOA: 07/23/2019 PCP: Antony Contras, MD    Brief Narrative:  85 year old with a remote history of CVA, chronic atrial fibrillation on Coumadin, stage IIIa CKD, COPD, CAD, chronic diastolic CHF, HTN, hypothyroidism, GERD, fibromyalgia, and moderate dementia who presented from home with several days of increasing wet cough and dyspnea.  On evaluation she was found to be hypoxic.  WBC was normal.  CT chest revealed significant debris and fluid collection in the airways suggesting mucous plugging.  Given full history there was a suspicion of aspiration pneumonitis.  Since her admission the patient has been seen by speech pathology and the pulmonary team.  Modified barium swallow revealed no evidence of significant dysphagia.  Respiratory virus panel revealed a rhinovirus.  Antimicrobials:  Unasyn 6/12 > 6/15 Azithromycin 6/12 > 6/14 Augmentin 6/15 > 6/16  Subjective: The patient states she feels worse today, specifically stating she is more short of breath.  She states she did not sleep well last night.  She remains confused.  When I spoke with the patient's daughter via phone however she confirms that the patient indeed has been somewhat more short of breath today.  Assessment & Plan:  Multifactorial acute hypoxic respiratory failure Likely a combination of all of the factors listed below -at present her CHF exacerbation and bronchospasm appear to be the driving factors  Acute exacerbation of chronic diastolic CHF Temporarily increase Lasix dose and monitor with hopes of affecting a significant diuresis -follow strict I's and O's and daily weights - currently net negative 700cc for admit   baseline weight during admit March 2021 67.6kg - July 2020 was 71.2kg   Filed Weights   07/23/19 1627 07/31/19 0614  Weight: 54.4 kg 55.6 kg     Acute bronchospastic COPD exacerbation Steroid dose was decreased and the patient was transitioned to inhalers  as opposed to nebulizers -concurrent with this the patient has developed significant worsening of her wheezing and respiratory difficulty -I will increase her steroid dose transiently, diurese her, and return to nebulizers  Aspiration pneumonitis MBS 6/14 showed only limited evidence of aspiration with a recommendation for D3 diet - family has chosen to continue regular diet with acceptance of aspiration risk which I feel is appropriate appropriate-completed 5 days of Augmentin therapy empirically -I discussed with the patient's family the likelihood that she is suffering intermittent episodes of aspiration on a low-grade level which are likely contributing to her respiratory issues  Rhinovirus URI - tracheobronchitis Management of acute tracheobronchitis as per PCCM recommendations -suspect this portion of her acute illness is essentially resolved  Chronic atrial fibrillation Rate controlled -continue Coumadin therapy with dosing per pharmacy  CAD s/p CABG 2000 Continue beta-blocker and Plavix - has reported allergy to statin  Gout Continue allopurinol  Dementia with depression Continue Aricept Namenda and SSRI  Severe Hypokalemia Due to use of diuretic and poor oral intake -corrected with aggressive supplementation -continue to follow with ongoing diuresis  Hypothyroidism Continue usual Synthroid dose  CKD Stage 3  DVT prophylaxis: Coumadin Code Status: NO CODE BLUE Family Communication: spoke w/ daughter on phone at length Status is: Inpatient  Remains inpatient appropriate because:Inpatient level of care appropriate due to severity of illness   Dispo: The patient is from: Home              Anticipated d/c is to: Home              Anticipated d/c date is: Not stable for  discharge home due to recurrent acute bronchospastic respiratory distress              Patient currently is not medically stable to d/c.  Consultants:  PCCM  Objective: Blood pressure (!) 148/63, pulse  91, temperature 97.9 F (36.6 C), temperature source Oral, resp. rate 18, height 5\' 6"  (1.676 m), weight 55.6 kg, SpO2 93 %.  Intake/Output Summary (Last 24 hours) at 08/01/2019 0843 Last data filed at 08/01/2019 0404 Gross per 24 hour  Intake 240 ml  Output 1500 ml  Net -1260 ml   Filed Weights   07/23/19 1627 07/31/19 0614  Weight: 54.4 kg 55.6 kg    Examination: General: Mild respiratory distress evident at time of exam Lungs: Coarse wet sounding upper airway sounds transmitted throughout with clear worsening expiratory wheezing without significant improvement today Cardiovascular: Regular rate without murmur or rub Abdomen: Nontender, nondistended, soft, bs+ Extremities: Trace lower extremity edema bilateral lower extremities without change  CBC: Recent Labs  Lab 07/29/19 0438 07/30/19 0453 08/01/19 0543  WBC 9.7 8.2 9.1  HGB 12.0 11.9* 11.6*  HCT 37.5 36.6 36.1  MCV 91.2 91.3 90.9  PLT 250 285 767   Basic Metabolic Panel: Recent Labs  Lab 07/26/19 0430 07/28/19 0435 07/30/19 0453 07/31/19 0656 08/01/19 0543  NA 138   < > 138 137 136  K 3.7   < > 2.8* 4.2 4.1  CL 103   < > 101 106 97*  CO2 26   < > 27 25 24   GLUCOSE 113*   < > 95 99 106*  BUN 15   < > 12 15 15   CREATININE 0.62   < > 0.63 0.68 0.69  CALCIUM 8.0*   < > 8.3* 8.6* 8.9  MG 2.1  --   --  2.5* 2.3   < > = values in this interval not displayed.   GFR: Estimated Creatinine Clearance: 47.6 mL/min (by C-G formula based on SCr of 0.69 mg/dL).  Liver Function Tests: Recent Labs  Lab 07/29/19 0438 08/01/19 0543  AST 30 49*  ALT 26 48*  ALKPHOS 50 50  BILITOT 0.8 0.7  PROT 6.3* 6.3*  ALBUMIN 3.6 3.6    Coagulation Profile: Recent Labs  Lab 07/28/19 0435 07/29/19 0438 07/30/19 0453 07/31/19 0656 08/01/19 0543  INR 2.4* 2.7* 2.9* 2.9* 2.8*    HbA1C: Hgb A1c MFr Bld  Date/Time Value Ref Range Status  03/05/2017 07:16 AM 6.0 (H) 4.8 - 5.6 % Final    Comment:    (NOTE) Pre diabetes:           5.7%-6.4% Diabetes:              >6.4% Glycemic control for   <7.0% adults with diabetes   02/14/2013 02:10 AM 6.3 (H) <5.7 % Final    Comment:    (NOTE)                                                                       According to the ADA Clinical Practice Recommendations for 2011, when HbA1c is used as a screening test:  >=6.5%   Diagnostic of Diabetes Mellitus           (if abnormal result is confirmed)  5.7-6.4%   Increased risk of developing Diabetes Mellitus References:Diagnosis and Classification of Diabetes Mellitus,Diabetes TMHD,6222,97(LGXQJ 1):S62-S69 and Standards of Medical Care in         Diabetes - 2011,Diabetes JHER,7408,14 (Suppl 1):S11-S61.     Recent Results (from the past 240 hour(s))  SARS Coronavirus 2 by RT PCR (hospital order, performed in [facility] hospital lab) Nasopharyngeal Nasopharyngeal Swab     Status: None   Collection Time: 07/23/19 11:36 PM   Specimen: Nasopharyngeal Swab  Result Value Ref Range Status   SARS Coronavirus 2 NEGATIVE NEGATIVE Final    Comment: (NOTE) SARS-CoV-2 target nucleic acids are NOT DETECTED.  The SARS-CoV-2 RNA is generally detectable in upper and lower respiratory specimens during the acute phase of infection. The lowest concentration of SARS-CoV-2 viral copies this assay can detect is 250 copies / mL. A negative result does not preclude SARS-CoV-2 infection and should not be used as the sole basis for treatment or other patient management decisions.  A negative result may occur with improper specimen collection / handling, submission of specimen other than nasopharyngeal swab, presence of viral mutation(s) within the areas targeted by this assay, and inadequate number of viral copies (<250 copies / mL). A negative result must be combined with clinical observations, patient history, and epidemiological information.  Fact Sheet for Patients:   StrictlyIdeas.no  Fact Sheet for  Healthcare Providers: BankingDealers.co.za  This test is not yet approved or  cleared by the [place] FDA and has been authorized for detection and/or diagnosis of SARS-CoV-2 by FDA under an Emergency Use Authorization (EUA).  This EUA will remain in effect (meaning this test can be used) for the duration of the COVID-19 declaration under Section 564(b)(1) of the Act, 21 U.S.C. section 360bbb-3(b)(1), unless the authorization is terminated or revoked sooner.  Performed at [facility], [place] [address] [place], [place] [address]   Culture, blood (routine x 2)     Status: None   Collection Time: 07/24/19  3:59 AM   Specimen: BLOOD  Result Value Ref Range Status   Specimen Description   Final    BLOOD LEFT ANTECUBITAL Performed at [place] [address] [place], [place] [address]    Special Requests   Final    BOTTLES DRAWN AEROBIC ONLY Blood Culture adequate volume Performed at [place] [address] [place], [place] [address]    Culture   Final    NO GROWTH 5 DAYS Performed at [facility], [place] [address] [place], [place] [address]    Report Status 07/29/2019 FINAL  Final  Culture, blood (routine x 2)     Status: None   Collection Time: 07/24/19  4:11 AM   Specimen: Right Antecubital; Blood  Result Value Ref Range Status   Specimen Description   Final    RIGHT ANTECUBITAL Performed at [place] [address], [place], [place] [address]    Special Requests   Final    BACTERIAL CASTS Blood Culture adequate volume Performed at [place] [address] [place], [place] [address]    Culture   Final    NO GROWTH 5 DAYS Performed at [facility], [place] [address], [place], [place] [address]    Report Status 07/29/2019 FINAL  Final  Expectorated sputum assessment w rflx to resp cult     Status: None   Collection Time:  07/24/19 11:45 AM   Specimen: Sputum  Result Value Ref Range Status   Specimen Description SPUTUM  Final   Special Requests EXPECTORANT  Final   Sputum evaluation   Final    THIS SPECIMEN IS ACCEPTABLE FOR SPUTUM CULTURE Performed at [facility], [place] [address], [place], [place] [address]    Report Status 07/24/2019 FINAL  Final  Culture, respiratory     Status: None   Collection Time: 07/24/19 11:45 AM   Specimen: SPU  Result Value Ref Range Status   Specimen Description   Final    SPUTUM Performed at [place] [address], [place], [place] [address]    Special Requests   Final    EXPECTORANT Reflexed from [id number] Performed at [facility], [place] [address] [place], [place] [address]    Gram Stain   Final    RARE WBC PRESENT, PREDOMINANTLY PMN FEW GRAM NEGATIVE RODS RARE GRAM POSITIVE COCCI IN PAIRS    Culture   Final    Consistent with normal respiratory flora. Performed at [facility], [place] [address] [place], [place] [address]    Report Status 07/26/2019 FINAL  Final     Scheduled Meds: . allopurinol  300 mg Oral QHS  . amLODipine  10 mg Oral QHS  . budesonide (PULMICORT) nebulizer solution  0.5 mg Nebulization BID  . citalopram  20 mg Oral BID  . clopidogrel  75 mg Oral Daily  . donepezil  5 mg Oral Daily  . feeding supplement  1 Container Oral BID BM  . furosemide  80 mg Intravenous BID  . guaiFENesin  1,200 mg Oral BID  . ipratropium-albuterol  3 mL Nebulization QID  . levothyroxine  25 mcg Oral Q0600  . losartan  25 mg Oral Daily  . memantine  10 mg Oral BID  . metoprolol succinate  25 mg Oral q morning - 10a  . potassium chloride  40 mEq Oral BID  . predniSONE  10 mg Oral Q breakfast  . warfarin  5 mg Oral q1600  . Warfarin - Physician Dosing Inpatient   Does not apply q1600     LOS: 9 days   Cherene Altes, MD [facility] Office  [phone] Pager - Text Page per  Amion  If 7PM-7AM, please contact night-coverage per Amion 08/01/2019, 8:43 AM

## 2019-08-02 LAB — CBC
HCT: 38 % (ref 36.0–46.0)
Hemoglobin: 11.9 g/dL — ABNORMAL LOW (ref 12.0–15.0)
MCH: 28.6 pg (ref 26.0–34.0)
MCHC: 31.3 g/dL (ref 30.0–36.0)
MCV: 91.3 fL (ref 80.0–100.0)
Platelets: 362 10*3/uL (ref 150–400)
RBC: 4.16 MIL/uL (ref 3.87–5.11)
RDW: 15.1 % (ref 11.5–15.5)
WBC: 11 10*3/uL — ABNORMAL HIGH (ref 4.0–10.5)
nRBC: 0 % (ref 0.0–0.2)

## 2019-08-02 LAB — BASIC METABOLIC PANEL
Anion gap: 10 (ref 5–15)
BUN: 19 mg/dL (ref 8–23)
CO2: 27 mmol/L (ref 22–32)
Calcium: 9 mg/dL (ref 8.9–10.3)
Chloride: 100 mmol/L (ref 98–111)
Creatinine, Ser: 0.84 mg/dL (ref 0.44–1.00)
GFR calc Af Amer: >60 mL/min (ref >60)
GFR calc non Af Amer: >60 mL/min (ref >60)
Glucose, Bld: 102 mg/dL — ABNORMAL HIGH (ref 70–99)
Potassium: 4.6 mmol/L (ref 3.5–5.1)
Sodium: 137 mmol/L (ref 135–145)

## 2019-08-02 LAB — MAGNESIUM: Magnesium: 2.3 mg/dL (ref 1.7–2.4)

## 2019-08-02 LAB — PROTIME-INR
INR: 2.9 — ABNORMAL HIGH (ref 0.8–1.2)
Prothrombin Time: 29.2 seconds — ABNORMAL HIGH (ref 11.4–15.2)

## 2019-08-02 MED ORDER — FUROSEMIDE 10 MG/ML IJ SOLN
60.0000 mg | Freq: Two times a day (BID) | INTRAMUSCULAR | Status: DC
  Administered 2019-08-03: 60 mg via INTRAVENOUS
  Filled 2019-08-02: qty 6

## 2019-08-02 MED ORDER — IPRATROPIUM-ALBUTEROL 0.5-2.5 (3) MG/3ML IN SOLN
3.0000 mL | Freq: Three times a day (TID) | RESPIRATORY_TRACT | Status: DC
  Administered 2019-08-02 – 2019-08-04 (×7): 3 mL via RESPIRATORY_TRACT
  Filled 2019-08-02 (×7): qty 3

## 2019-08-02 MED ORDER — POTASSIUM CHLORIDE CRYS ER 20 MEQ PO TBCR
20.0000 meq | EXTENDED_RELEASE_TABLET | Freq: Two times a day (BID) | ORAL | Status: DC
  Administered 2019-08-02 – 2019-08-04 (×4): 20 meq via ORAL
  Filled 2019-08-02 (×4): qty 1

## 2019-08-02 NOTE — Progress Notes (Signed)
Physical Therapy Treatment Patient Details Name: Amber [name] MRN: [id number] DOB: [birth year] Today's Date: 08/02/2019    History of Present Illness 85 y.o. female with a history of chronic AFib on coumadin, stage IIIa CKD, COPD with emphysema, CAD, chronic HFpEF, HTN, hypothyroidism, GERD, osteoarthritis and fibromyalgia, moderate dementia, CVA with left weakness, fall down stairs in March 2021 resulting in left rib fx and T2, 6,9 compression fractures. Pt presented from home due to several days of increasing wet cough and dyspnea. On evaluation she was afebrile with hypoxia.  Pt admitted for Acute hypoxemic respiratory failure due to aspiration pneumonia, pneumonitis and tracheobronchitis on COPD without acute exacerbation    PT Comments    Pt continues to participate and progress well. Daughter reports she and her sister having been walking with pt in hallways as well.    Follow Up Recommendations  Home health PT;Supervision/Assistance - 24 hour     Equipment Recommendations  None recommended by PT    Recommendations for Other Services       Precautions / Restrictions Precautions Precautions: Fall Precaution Comments: L AFO, hx of L deficits since CVA; monitor sats Restrictions Weight Bearing Restrictions: No    Mobility  Bed Mobility               General bed mobility comments: pt in recliner  Transfers Overall transfer level: Needs assistance Equipment used: Rolling walker (2 wheeled) Transfers: Sit to/from Stand Sit to Stand: Min assist           Ambulation/Gait Ambulation/Gait assistance: Min Web designer (Feet): 125 Feet Assistive device: Rolling walker (2 wheeled) Gait Pattern/deviations: Step-through pattern;Decreased stride length     General Gait Details: Intermittent assist to steady. Pt toleratd distance well.   Stairs             Wheelchair Mobility    Modified Rankin (Stroke Patients Only)       Balance Overall  balance assessment: Needs assistance         Standing balance support: During functional activity;Bilateral upper extremity supported Standing balance-Leahy Scale: Poor Standing balance comment: reliant on RW                            Cognition Arousal/Alertness: Awake/alert Behavior During Therapy: WFL for tasks assessed/performed Overall Cognitive Status: Within Functional Limits for tasks assessed                                        Exercises      General Comments        Pertinent Vitals/Pain Pain Assessment: No/denies pain    Home Living                      Prior Function            PT Goals (current goals can now be found in the care plan section) Progress towards PT goals: Progressing toward goals    Frequency    Min 3X/week      PT Plan Discharge plan needs to be updated    Co-evaluation              AM-PAC PT "6 Clicks" Mobility   Outcome Measure  Help needed turning from your back to your side while in a flat bed without using bedrails?: A Little Help needed moving  from lying on your back to sitting on the side of a flat bed without using bedrails?: A Little Help needed moving to and from a bed to a chair (including a wheelchair)?: A Little Help needed standing up from a chair using your arms (e.g., wheelchair or bedside chair)?: A Little Help needed to walk in hospital room?: A Little Help needed climbing 3-5 steps with a railing? : A Little 6 Click Score: 18    End of Session Equipment Utilized During Treatment: Gait belt Activity Tolerance: Patient tolerated treatment well Patient left: in chair;with call bell/phone within reach;with family/visitor present   PT Visit Diagnosis: Other abnormalities of gait and mobility (R26.89)     Time: 5997-7414 PT Time Calculation (min) (ACUTE ONLY): 14 min  Charges:  $Gait Training: 8-22 mins              Doreatha Massed, PT Acute Rehabilitation   Office: [phone] Pager: [phone]

## 2019-08-02 NOTE — Progress Notes (Signed)
Amber [name]  SAY:[id number] DOB: [birth year] DOA: 07/23/2019 PCP: Antony Contras, MD    Brief Narrative:  85 year old with a remote history of CVA, chronic atrial fibrillation on Coumadin, stage IIIa CKD, COPD, CAD, chronic diastolic CHF, HTN, hypothyroidism, GERD, fibromyalgia, and moderate dementia who presented from home with several days of increasing wet cough and dyspnea.  On evaluation she was found to be hypoxic.  WBC was normal.  CT chest revealed significant debris and fluid collection in the airways suggesting mucous plugging.  Given full history there was a suspicion of aspiration pneumonitis.  Since her admission the patient has been seen by speech pathology and the pulmonary team.  Modified barium swallow revealed no evidence of significant dysphagia.  Respiratory virus panel revealed a rhinovirus.  Antimicrobials:  Unasyn 6/12 > 6/15 Azithromycin 6/12 > 6/14 Augmentin 6/15 > 6/16  Subjective: The patient is sitting up in a bedside chair. She appears comfortable. There is no evidence of acute distress at this time. There are however very wet gurgly sounding upper airway sounds that are appreciable upon entering the room. The patient is very pleasant and alert but confused and not able to provide a reliable history. I had a prolonged discussion with the patient's daughter at the bedside.  Assessment & Plan:  Multifactorial acute hypoxic respiratory failure Likely a combination of all of the factors listed below - at present her CHF exacerbation and bronchospasm appear to be the driving factors -as I have discussed with the family I feel that she will likely continue to have issues of this sort due to the difficulty of balancing all factors affecting her breathing and upper airway sounds -see further discussion below  Acute exacerbation of chronic diastolic CHF Temporarily increase Lasix dose and monitor with hopes of affecting a significant diuresis -follow strict I's and O's and  daily weights - currently net negative ~1200cc for admit -anticipate need to slow diuresis over the next 24 hours  baseline weight during admit March 2021 67.6kg - July 2020 was 71.2kg   Filed Weights   07/23/19 1627 07/31/19 0614  Weight: 54.4 kg 55.6 kg     Acute bronchospastic COPD exacerbation Steroid dose was decreased and the patient was transitioned to inhalers as opposed to nebulizers -concurrent with this the patient developed significant worsening of her wheezing and respiratory difficulty -I increased her steroid dose transiently, diuresed her, and returned to nebulizers -there is no appreciable peripheral wheezing at time of exam today  Aspiration pneumonitis MBS 6/14 showed only limited evidence of aspiration with a recommendation for D3 diet - family has chosen to continue regular diet with acceptance of aspiration risk which I feel is appropriate appropriate-completed 5 days of Augmentin therapy empirically -I discussed with the patient's family the likelihood that she is suffering intermittent episodes of aspiration on a low-grade level which are likely contributing to her respiratory issues -she is clearly having difficulty handling her oropharyngeal secretions given the fact that anytime she goes to sleep she experiences significant wet gurgly respirations upon waking up - examination during these periods is consistent with upper airway sounds -I explained to the patient's daughter at bedside today that she will likely have this is an ongoing problem from this point forward and that I suspect is related to progressive loss of swallowing ability related to her dementia  Rhinovirus URI - tracheobronchitis suspect this portion of her acute illness is essentially resolved  Chronic atrial fibrillation Rate controlled -continue Coumadin therapy with dosing per pharmacy  CAD s/p CABG 2000 Continue beta-blocker and Plavix - has reported allergy to statin  Gout Continue  allopurinol  Dementia with depression Continue Aricept Namenda and SSRI -family reports very poor appetite and limited intake for approximately a year now raising concern that she may be entering the early stages of terminal dementia -I discussed this with the patient's daughter today  Severe Hypokalemia Due to use of diuretic and poor oral intake -corrected with aggressive supplementation -continue to follow with ongoing diuresis  Hypothyroidism Continue usual Synthroid dose  CKD Stage 3  DVT prophylaxis: Coumadin Code Status: NO CODE BLUE Family Communication: spoke w/ daughter at bedside at length Status is: Inpatient  Remains inpatient appropriate because:Inpatient level of care appropriate due to severity of illness   Dispo: The patient is from: Home              Anticipated d/c is to: Home              Anticipated d/c date is: Not stable for discharge home due to recurrent acute bronchospastic respiratory distress              Patient currently is not medically stable to d/c.  Consultants:  PCCM  Objective: Blood pressure (!) 144/77, pulse 61, temperature 98.3 F (36.8 C), temperature source Oral, resp. rate 17, height 5\' 6"  (1.676 m), weight 55.6 kg, SpO2 93 %.  Intake/Output Summary (Last 24 hours) at 08/02/2019 0846 Last data filed at 08/01/2019 1125 Gross per 24 hour  Intake --  Output 425 ml  Net -425 ml   Filed Weights   07/23/19 1627 07/31/19 0614  Weight: 54.4 kg 55.6 kg    Examination: General: In no acute distress Lungs: Coarse wet sounding upper airway sounds transmitted throughout -no wheezing appreciable and peripheral lung fields Cardiovascular: Regular rate without murmur or rub or rub Abdomen: Nontender, nondistended, soft, bs+ Extremities: No significant edema bilateral lower extremities  CBC: Recent Labs  Lab 07/30/19 0453 08/01/19 0543 08/02/19 0547  WBC 8.2 9.1 11.0*  HGB 11.9* 11.6* 11.9*  HCT 36.6 36.1 38.0  MCV 91.3 90.9 91.3   PLT 285 339 007   Basic Metabolic Panel: Recent Labs  Lab 07/31/19 0656 08/01/19 0543 08/02/19 0547  NA 137 136 137  K 4.2 4.1 4.6  CL 106 97* 100  CO2 25 24 27   GLUCOSE 99 106* 102*  BUN 15 15 19   CREATININE 0.68 0.69 0.84  CALCIUM 8.6* 8.9 9.0  MG 2.5* 2.3 2.3   GFR: Estimated Creatinine Clearance: 45.3 mL/min (by C-G formula based on SCr of 0.84 mg/dL).  Liver Function Tests: Recent Labs  Lab 07/29/19 0438 08/01/19 0543  AST 30 49*  ALT 26 48*  ALKPHOS 50 50  BILITOT 0.8 0.7  PROT 6.3* 6.3*  ALBUMIN 3.6 3.6    Coagulation Profile: Recent Labs  Lab 07/29/19 0438 07/30/19 0453 07/31/19 0656 08/01/19 0543 08/02/19 0547  INR 2.7* 2.9* 2.9* 2.8* 2.9*    HbA1C: Hgb A1c MFr Bld  Date/Time Value Ref Range Status  03/05/2017 07:16 AM 6.0 (H) 4.8 - 5.6 % Final    Comment:    (NOTE) Pre diabetes:          5.7%-6.4% Diabetes:              >6.4% Glycemic control for   <7.0% adults with diabetes   02/14/2013 02:10 AM 6.3 (H) <5.7 % Final    Comment:    (NOTE)  According to the ADA Clinical Practice Recommendations for 2011, when HbA1c is used as a screening test:  >=6.5%   Diagnostic of Diabetes Mellitus           (if abnormal result is confirmed) 5.7-6.4%   Increased risk of developing Diabetes Mellitus References:Diagnosis and Classification of Diabetes Mellitus,Diabetes LEXN,1700,17(CBSWH 1):S62-S69 and Standards of Medical Care in         Diabetes - 2011,Diabetes QPRF,1638,46 (Suppl 1):S11-S61.     Recent Results (from the past 240 hour(s))  SARS Coronavirus 2 by RT PCR (hospital order, performed in [facility] hospital lab) Nasopharyngeal Nasopharyngeal Swab     Status: None   Collection Time: 07/23/19 11:36 PM   Specimen: Nasopharyngeal Swab  Result Value Ref Range Status   SARS Coronavirus 2 NEGATIVE NEGATIVE Final    Comment: (NOTE) SARS-CoV-2 target nucleic acids are NOT  DETECTED.  The SARS-CoV-2 RNA is generally detectable in upper and lower respiratory specimens during the acute phase of infection. The lowest concentration of SARS-CoV-2 viral copies this assay can detect is 250 copies / mL. A negative result does not preclude SARS-CoV-2 infection and should not be used as the sole basis for treatment or other patient management decisions.  A negative result may occur with improper specimen collection / handling, submission of specimen other than nasopharyngeal swab, presence of viral mutation(s) within the areas targeted by this assay, and inadequate number of viral copies (<250 copies / mL). A negative result must be combined with clinical observations, patient history, and epidemiological information.  Fact Sheet for Patients:   StrictlyIdeas.no  Fact Sheet for Healthcare Providers: BankingDealers.co.za  This test is not yet approved or  cleared by the [place] FDA and has been authorized for detection and/or diagnosis of SARS-CoV-2 by FDA under an Emergency Use Authorization (EUA).  This EUA will remain in effect (meaning this test can be used) for the duration of the COVID-19 declaration under Section 564(b)(1) of the Act, 21 U.S.C. section 360bbb-3(b)(1), unless the authorization is terminated or revoked sooner.  Performed at [facility], [place] [address], [place], [place] [address]   Culture, blood (routine x 2)     Status: None   Collection Time: 07/24/19  3:59 AM   Specimen: BLOOD  Result Value Ref Range Status   Specimen Description   Final    BLOOD LEFT ANTECUBITAL Performed at [facility] [address], [place], [place] [address]    Special Requests   Final    BOTTLES DRAWN AEROBIC ONLY Blood Culture adequate volume Performed at [facility] [address], [place], [place] [address]    Culture   Final    NO GROWTH 5  DAYS Performed at [facility], [place] [address], [place], [place] [address]    Report Status 07/29/2019 FINAL  Final  Culture, blood (routine x 2)     Status: None   Collection Time: 07/24/19  4:11 AM   Specimen: Right Antecubital; Blood  Result Value Ref Range Status   Specimen Description   Final    RIGHT ANTECUBITAL Performed at [facility] [address], [place], [place] [address]    Special Requests   Final    BACTERIAL CASTS Blood Culture adequate volume Performed at [facility] [address], [place], [place] [address]    Culture   Final    NO GROWTH 5 DAYS Performed at [facility], [place]  [address], [place], [place] [address]    Report Status 07/29/2019 FINAL  Final  Expectorated sputum assessment w rflx to resp cult     Status: None   Collection Time: 07/24/19 11:45 AM   Specimen: Sputum  Result Value Ref Range Status   Specimen Description SPUTUM  Final   Special Requests EXPECTORANT  Final   Sputum evaluation   Final    THIS SPECIMEN IS ACCEPTABLE FOR SPUTUM CULTURE Performed at [facility], [place] [address], [place], [place] [address]    Report Status 07/24/2019 FINAL  Final  Culture, respiratory     Status: None   Collection Time: 07/24/19 11:45 AM   Specimen: SPU  Result Value Ref Range Status   Specimen Description   Final    SPUTUM Performed at [facility] [address] [place], [place] [address]    Special Requests   Final    EXPECTORANT Reflexed from [id number] Performed at [facility], [place] [address] [place], [place] [address]    Gram Stain   Final    RARE WBC PRESENT, PREDOMINANTLY PMN FEW GRAM NEGATIVE RODS RARE GRAM POSITIVE COCCI IN PAIRS    Culture   Final    Consistent with normal respiratory flora. Performed at [facility], [place] [address], [place], [place] [address]    Report Status 07/26/2019 FINAL  Final      Scheduled Meds: . allopurinol  300 mg Oral QHS  . amLODipine  10 mg Oral QHS  . budesonide (PULMICORT) nebulizer solution  0.5 mg Nebulization BID  . citalopram  20 mg Oral BID  . clopidogrel  75 mg Oral Daily  . donepezil  5 mg Oral Daily  . feeding supplement  1 Container Oral BID BM  . furosemide  80 mg Intravenous BID  . guaiFENesin  1,200 mg Oral BID  . ipratropium-albuterol  3 mL Nebulization QID  . levothyroxine  25 mcg Oral Q0600  . losartan  25 mg Oral Daily  . memantine  10 mg Oral BID  . metoprolol succinate  25 mg Oral q morning - 10a  . potassium chloride  40 mEq Oral BID  . predniSONE  10 mg Oral Q breakfast  . warfarin  5 mg Oral q1600  . Warfarin - Physician Dosing Inpatient   Does not apply q1600     LOS: 10 days   Cherene Altes, MD [facility] Office  [phone] Pager - Text Page per Amion  If 7PM-7AM, please contact night-coverage per Amion 08/02/2019, 8:46 AM

## 2019-08-02 NOTE — Progress Notes (Signed)
Nutrition Follow-up  INTERVENTION:   -Boost Breeze po TID, each supplement provides 250 kcal and 9 grams of protein  -Magic cup TID with meals, each supplement provides 290 kcal and 9 grams of protein  NUTRITION DIAGNOSIS:   Inadequate oral intake related to poor appetite as evidenced by meal completion < 25%, per patient/family report.  Ongoing.  GOAL:   Patient will meet greater than or equal to 90% of their needs  Not meeting.  MONITOR:   PO intake, Supplement acceptance  REASON FOR ASSESSMENT:   Malnutrition Screening Tool    ASSESSMENT:   Pt with remote hx of CVA, Afib on coumadin, stage III CKD, COPD with emphysema, CAD, chronic CHF, HTN, hypothyroidism, GERD, OA, and moderate dementia admitted with acute respiratory failure due to aspiration PNA and tracheobronchitis on COPD.  Patient currently consuming 0-25% of meals. Pt was drinking Boost Breeze as of 6/20. None documented as accepted yesterday.   Admission weight: 120 lbs. Current weight (6/20): 122 lbs.  Medications: Lasix, KLOR-CON Labs reviewed.  Diet Order:   Diet Order            Diet regular Room service appropriate? Yes; Fluid consistency: Thin  Diet effective now                 EDUCATION NEEDS:   Education needs have been addressed  Skin:  Skin Assessment: Reviewed RN Assessment  Last BM:  6/18 -type 4  Height:   Ht Readings from Last 1 Encounters:  07/23/19 5\' 6"  (1.676 m)    Weight:   Wt Readings from Last 1 Encounters:  07/31/19 55.6 kg    Ideal Body Weight:     BMI:  Body mass index is 19.78 kg/m.  Estimated Nutritional Needs:   Kcal:  1500-1700  Protein:  70-85 grams  Fluid:  >1.5 L/day   Clayton Bibles, MS, RD, LDN Inpatient Clinical Dietitian Contact information available via Amion

## 2019-08-02 NOTE — Progress Notes (Signed)
Occupational Therapy Treatment Patient Details Name: Amber [name] MRN: [id number] DOB: December 25, 1934 Today's Date: 08/02/2019    History of present illness 85 y.o. female with a history of chronic AFib on coumadin, stage IIIa CKD, COPD with emphysema, CAD, chronic HFpEF, HTN, hypothyroidism, GERD, osteoarthritis and fibromyalgia, moderate dementia, CVA with left weakness, fall down stairs in March 2021 resulting in left rib fx and T2, 6,9 compression fractures. Pt presented from home due to several days of increasing wet cough and dyspnea. On evaluation she was afebrile with hypoxia.  Pt admitted for Acute hypoxemic respiratory failure due to aspiration pneumonia, pneumonitis and tracheobronchitis on COPD without acute exacerbation   OT comments  Daughter present for OT session.  Family will provide 24/7 A  Follow Up Recommendations  Home health OT;Other (comment)    Equipment Recommendations  None recommended by OT    Recommendations for Other Services      Precautions / Restrictions Precautions Precautions: Fall       Mobility Bed Mobility               General bed mobility comments: pt in recliner  Transfers Overall transfer level: Needs assistance Equipment used: Rolling walker (2 wheeled) Transfers: Sit to/from [facility] Sit to Stand: Min assist Stand pivot transfers: Min assist            Balance Overall balance assessment: Needs assistance         Standing balance support: During functional activity;Bilateral upper extremity supported Standing balance-Leahy Scale: Poor Standing balance comment: reliant on RW                           ADL either performed or assessed with clinical judgement   ADL Overall ADL's : Needs assistance/impaired                         Toilet Transfer: Minimal assistance;RW;Cueing for safety;Cueing for sequencing   Toileting- Clothing Manipulation and Hygiene: Minimal assistance;Sit  to/from stand;Cueing for safety;Cueing for compensatory techniques;Cueing for sequencing         General ADL Comments: Pt performed sit to stand 10 times in prep for ADL activity .  Focused on hand placement as well as safety     Vision Patient Visual Report: No change from baseline     Perception     Praxis      Cognition Arousal/Alertness: Awake/alert Behavior During Therapy: WFL for tasks assessed/performed Overall Cognitive Status: Within Functional Limits for tasks assessed                                                     Pertinent Vitals/ Pain       Pain Assessment: No/denies pain         Frequency  Min 2X/week        Progress Toward Goals  OT Goals(current goals can now be found in the care plan section)  Progress towards OT goals: Progressing toward goals     Plan Discharge plan remains appropriate       AM-PAC OT "6 Clicks" Daily Activity     Outcome Measure   Help from another person eating meals?: A Little Help from another person taking care of personal grooming?: A Little Help from another person  toileting, which includes using toliet, bedpan, or urinal?: A Little Help from another person bathing (including washing, rinsing, drying)?: A Little Help from another person to put on and taking off regular upper body clothing?: A Little Help from another person to put on and taking off regular lower body clothing?: A Little 6 Click Score: 18    End of Session Equipment Utilized During Treatment: Gait belt;Rolling walker  OT Visit Diagnosis: Unsteadiness on feet (R26.81);Other abnormalities of gait and mobility (R26.89);Muscle weakness (generalized) (M62.81)   Activity Tolerance Patient tolerated treatment well   Patient Left in chair;with call bell/phone within reach;with family/visitor present   Nurse Communication Mobility status (okay to see)        Time: 8864-8472 OT Time Calculation (min): 18 min  Charges: OT  General Charges $OT Visit: 1 Visit OT Treatments $Self Care/Home Management : 8-22 mins  Kari Baars, [place] [phone] Office- [phone]      Lin Landsman, Edwena Felty D 08/02/2019, 12:33 PM

## 2019-08-03 DIAGNOSIS — J690 Pneumonitis due to inhalation of food and vomit: Secondary | ICD-10-CM

## 2019-08-03 DIAGNOSIS — I5033 Acute on chronic diastolic (congestive) heart failure: Secondary | ICD-10-CM

## 2019-08-03 DIAGNOSIS — J9601 Acute respiratory failure with hypoxia: Secondary | ICD-10-CM

## 2019-08-03 LAB — BASIC METABOLIC PANEL
Anion gap: 10 (ref 5–15)
BUN: 20 mg/dL (ref 8–23)
CO2: 28 mmol/L (ref 22–32)
Calcium: 8.8 mg/dL — ABNORMAL LOW (ref 8.9–10.3)
Chloride: 99 mmol/L (ref 98–111)
Creatinine, Ser: 0.88 mg/dL (ref 0.44–1.00)
GFR calc Af Amer: >60 mL/min (ref >60)
GFR calc non Af Amer: >60 mL/min (ref >60)
Glucose, Bld: 94 mg/dL (ref 70–99)
Potassium: 4.4 mmol/L (ref 3.5–5.1)
Sodium: 137 mmol/L (ref 135–145)

## 2019-08-03 LAB — PROTIME-INR
INR: 2.7 — ABNORMAL HIGH (ref 0.8–1.2)
Prothrombin Time: 27.9 seconds — ABNORMAL HIGH (ref 11.4–15.2)

## 2019-08-03 MED ORDER — FUROSEMIDE 40 MG PO TABS
80.0000 mg | ORAL_TABLET | Freq: Every day | ORAL | Status: DC
  Administered 2019-08-03 – 2019-08-04 (×2): 80 mg via ORAL
  Filled 2019-08-03 (×2): qty 2

## 2019-08-03 NOTE — TOC Benefit Eligibility Note (Signed)
Transition of Care (TOC) Benefit Eligibility Note    Patient Details  Name: Suzane H Meller MRN: [id number] Date of Birth: 10/09/1934   Medication/Dose: asked to quote highest dosage  Covered?: Yes  Tier: Other  Prescription Coverage Preferred Pharmacy: local  Spoke with Person/Company/Phone Number:: Michelle/ [facility]  [phone]  Co-Pay: Breo $7.00, Symbicort $40.00, Advair HFA $40.00. Dulera $64.00 last resort drug  have to try orther Rx's on formulary and requires prior auth [phone], Wixela $4.00  Prior Approval: No (only for Dulera)  Deductible: Met       ,  Phone Number: 08/03/2019, 10:01 AM     

## 2019-08-03 NOTE — Progress Notes (Signed)
PROGRESS NOTE    Amber [name]  YSA:[id number] DOB: 01-12-1935 DOA: 07/23/2019 PCP: Antony Contras, MD   Brief Narrative: Amber [name] is a 85 y.o. female with a history of CVA, chronic atrial fibrillation on Coumadin, stage IIIa CKD, COPD, CAD, chronic diastolic CHF, HTN, hypothyroidism, GERD, fibromyalgia, and moderate dementia. Patient presented secondary to cough and dyspnea and found to have hypoxic respiratory failure from multiple factors including heart failure, COPD exacerbation and aspiration pneumonitis.   Assessment & Plan:   Principal Problem:   Acute on chronic diastolic CHF (congestive heart failure) (HCC) Active Problems:   Hypoxia   Aspiration pneumonitis (HCC)   Acute respiratory failure with hypoxia (HCC)   Acute respiratory failure with hypoxia Multifactorial in setting of heart failure, acute COPD, aspiration pneumonitis. Patient has been successfully weaned to room air.  Acute on chronic diastolic heart failure Patient treated with Lasix IV with good diuresis and improvement of respiratory status. There was an initial attempt to transition to oral lasix which unfortunately required re initiation of Lasix IV. Symptoms are again improved -Transition to Lasix 80 mg PO -Strict in/out  COPD exacerbation Patient managed on steroids and nebulizer treatments with improvement of symptoms. No current wheezing. Patient seen by pulmonology who recommend an LABA/ICS inhaler and outpatient follow-up. Also recommendation for steroid taper  Aspiration pneumonitis Patient continues on a regular diet with understanding of aspiration risk. Dysphagia likely related to dementia.  Rhinovirus URI Tracheobronchitis Improved.  Chronic atrial fibrillation -Continue Coumadin and metoprolol  CAD S/p CABG -Continue metoprolol and Plavix. Not on statin secondary to allergy  Gout -Continue allopurinol  Dementia with depression -Continue Aricept, Namenda and  Celexa  Hypokalemia Secondary to diuresis -Repletion as needed  Hypothyroidism -Continue Synthroid  CKD stage II GFR does not support diagnosis of stage III at this time.   DVT prophylaxis: Coumadin Code Status:   Code Status: DNR Family Communication: Daughter at bedside Disposition Plan: Discharge home likely in 1-2 days pending stability on oral diuretic   Consultants:   Pulmonology  Procedures:   None  Antimicrobials:  Azithromycin  Unasyn  Augmentin    Subjective: Cough is improved. No other concerns  Objective: Vitals:   08/03/19 0851 08/03/19 0908 08/03/19 1510 08/03/19 1725  BP: 135/63  (!) 121/57 (!) 132/56  Pulse:   63   Resp:   18   Temp:   97.8 F (36.6 C)   TempSrc:   Oral   SpO2:  94% 96%   Weight:      Height:        Intake/Output Summary (Last 24 hours) at 08/03/2019 1847 Last data filed at 08/03/2019 0500 Gross per 24 hour  Intake --  Output 450 ml  Net -450 ml   Filed Weights   07/23/19 1627 07/31/19 0614  Weight: 54.4 kg 55.6 kg    Examination:  General exam: Appears calm and comfortable Respiratory system: Rales at bases bilaterally. Respiratory effort normal. Cardiovascular system: S1 & S2 heard, RRR Gastrointestinal system: Abdomen is nondistended, soft and nontender. No organomegaly or masses felt. Normal bowel sounds heard. Central nervous system: Alert. No focal neurological deficits. Musculoskeletal: No calf tenderness Skin: No cyanosis. No rashes Psychiatry: Judgement and insight appear normal. Mood & affect appropriate.     Data Reviewed: I have personally reviewed following labs and imaging studies  CBC Lab Results  Component Value Date   WBC 11.0 (H) 08/02/2019   RBC 4.16 08/02/2019   HGB 11.9 (L) 08/02/2019  HCT 38.0 08/02/2019   MCV 91.3 08/02/2019   MCH 28.6 08/02/2019   PLT 362 08/02/2019   MCHC 31.3 08/02/2019   RDW 15.1 08/02/2019   LYMPHSABS 1.5 07/23/2019   MONOABS 0.9 07/23/2019    EOSABS 0.2 07/23/2019   BASOSABS 0.1 88/50/2774     Last metabolic panel Lab Results  Component Value Date   NA 137 08/03/2019   K 4.4 08/03/2019   CL 99 08/03/2019   CO2 28 08/03/2019   BUN 20 08/03/2019   CREATININE 0.88 08/03/2019   GLUCOSE 94 08/03/2019   GFRNONAA >60 08/03/2019   GFRAA >60 08/03/2019   CALCIUM 8.8 (L) 08/03/2019   PROT 6.3 (L) 08/01/2019   ALBUMIN 3.6 08/01/2019   BILITOT 0.7 08/01/2019   ALKPHOS 50 08/01/2019   AST 49 (H) 08/01/2019   ALT 48 (H) 08/01/2019   ANIONGAP 10 08/03/2019    CBG (last 3)  No results for input(s): GLUCAP in the last 72 hours.   GFR: Estimated Creatinine Clearance: 43.3 mL/min (by C-G formula based on SCr of 0.88 mg/dL).  Coagulation Profile: Recent Labs  Lab 07/30/19 0453 07/31/19 0656 08/01/19 0543 08/02/19 0547 08/03/19 0746  INR 2.9* 2.9* 2.8* 2.9* 2.7*    No results found for this or any previous visit (from the past 240 hour(s)).      Radiology Studies: No results found.      Scheduled Meds: . allopurinol  300 mg Oral QHS  . amLODipine  10 mg Oral QHS  . budesonide (PULMICORT) nebulizer solution  0.5 mg Nebulization BID  . citalopram  20 mg Oral BID  . clopidogrel  75 mg Oral Daily  . donepezil  5 mg Oral Daily  . feeding supplement  1 Container Oral BID BM  . furosemide  80 mg Oral Daily  . guaiFENesin  1,200 mg Oral BID  . ipratropium-albuterol  3 mL Nebulization TID  . levothyroxine  25 mcg Oral Q0600  . losartan  25 mg Oral Daily  . memantine  10 mg Oral BID  . metoprolol succinate  25 mg Oral q morning - 10a  . potassium chloride  20 mEq Oral BID  . predniSONE  10 mg Oral Q breakfast  . warfarin  5 mg Oral q1600  . Warfarin - Physician Dosing Inpatient   Does not apply q1600   Continuous Infusions:   LOS: 11 days     Amber Poche, MD [facility] 08/03/2019, 6:47 PM  If 7PM-7AM, please contact night-coverage [email or web address]

## 2019-08-04 DIAGNOSIS — B348 Other viral infections of unspecified site: Secondary | ICD-10-CM

## 2019-08-04 LAB — PROTIME-INR
INR: 2.6 — ABNORMAL HIGH (ref 0.8–1.2)
Prothrombin Time: 26.8 seconds — ABNORMAL HIGH (ref 11.4–15.2)

## 2019-08-04 MED ORDER — GUAIFENESIN ER 600 MG PO TB12: 1200.0000 mg | ORAL_TABLET | Freq: Two times a day (BID) | ORAL | 0 refills | Status: AC

## 2019-08-04 MED ORDER — IPRATROPIUM-ALBUTEROL 0.5-2.5 (3) MG/3ML IN SOLN: 3.0000 mL | RESPIRATORY_TRACT | 0 refills | Status: AC | PRN

## 2019-08-04 MED ORDER — FLUTICASONE FUROATE-VILANTEROL 100-25 MCG/INH IN AEPB: 1.0000 | INHALATION_SPRAY | Freq: Every day | RESPIRATORY_TRACT | 0 refills | Status: DC

## 2019-08-04 MED ORDER — AMLODIPINE BESYLATE 10 MG PO TABS: 10.0000 mg | ORAL_TABLET | Freq: Every day | ORAL | 0 refills | Status: DC

## 2019-08-04 MED ORDER — PREDNISONE 2.5 MG PO TABS: ORAL_TABLET | ORAL | 0 refills | Status: AC

## 2019-08-04 MED ORDER — CARBAMIDE PEROXIDE 6.5 % OT SOLN
5.0000 [drp] | Freq: Two times a day (BID) | OTIC | Status: DC
  Administered 2019-08-04: 5 [drp] via OTIC
  Filled 2019-08-04: qty 15

## 2019-08-04 NOTE — Discharge Summary (Signed)
Physician Discharge Summary  Amber [name] IRC:[id number] DOB: April 11, 1934 DOA: 07/23/2019  PCP: Antony Contras, MD  Admit date: 07/23/2019 Discharge date: 08/04/2019  Admitted From: Home Disposition: Home  Recommendations for Outpatient Follow-up:  1. Follow up with PCP in 1 week 2. Follow up with Pulmonology 3. Follow up with Cardiology 4. Please obtain BMP/CBC in one week 5. Please follow up on the following pending results: None  Home Health: PT, OT Equipment/Devices: Nebulizer machine  Discharge Condition: Stable CODE STATUS: DNR Diet recommendation: Heart healthy   Brief/Interim Summary:  Admission HPI written by Christel Mormon, MD   HISTORY OF PRESENT ILLNESS: Amber [name]  is a 85 y.o. female with a known history of dementia, coronary artery disease, CVA, COPD, hypertension, hypothyroidism, chronic diastolic CHF and dyslipidemia, who presented to the emergency room with acute onset of recent worsening cough with associated dyspnea and inability to expectorate which has been intermittent lately.  Yesterday she was having wet rattling cough and when she eats with try to clear her throat.  There was a question about aspiration recently by her daughter who is a Marine scientist with solid food and fluids.  She had a fever last night and has after taking Tylenol her temperature was 99.9 without chills.  Her daughter noticed today that the patient was hypoxemic below 90 on pulse oximetry.  The patient was prescribed doxycycline by her PCP yesterday.  No reported nausea or vomiting.  No abdominal pain or melena or bright red bleeding per rectum.  She denies any dysuria, oliguria or hematuria or flank pain.  No chest pain or palpitations.  Upon presentation to the emergency room, respiratory it was 18 and later 23 with otherwise normal vital signs.  Labs revealed unremarkable CBC and BMP showing hypokalemia of 3.4 with negative LFTs.  INR was 2.2 on Coumadin and PTT 23.7 persistent blunting of  the left costodiaphragmatic angle likely small effusion and/or atelectasis. EKG showed atrial fibrillation with controlled ventricular sponsor of 71 with right axis deviation and nonspecific repolarization.  Chest CTA revealed no evidence for PE but showed 1. Significant debris within the mid trachea with fluid level, with debris extending into the left greater than right mainstem bronchi. There is short segment complete occlusion of left lower lobe bronchus with distal bronchial filling and mucoid impaction. Findings may be due to aspiration or mucous plugging. 2. Trace left pleural effusion. 3. Minimal peripheral opacity in the right upper lobe is unchanged from prior exam and suggestive of scarring. 4. Reflux of contrast into the hepatic veins and IVC consistent with elevated right heart pressures.   Hospital course:  Acute respiratory failure with hypoxia Multifactorial in setting of heart failure, acute COPD, aspiration pneumonitis. Patient has been successfully weaned to room air.  Acute on chronic diastolic heart failure Patient treated with Lasix IV with good diuresis and improvement of respiratory status. There was an initial attempt to transition to oral lasix which unfortunately required re initiation of Lasix IV. Symptoms are again improved. Resume home Lasix 80 mg daily. Will need outpatient cardiology follow-up.  COPD exacerbation Patient managed on steroids and nebulizer treatments with improvement of symptoms. No current wheezing. Patient seen by pulmonology who recommend an LABA/ICS inhaler and outpatient follow-up. Also recommendation for steroid taper. Patient discharged on Breo Ellipta, Duonebs with nebulizer machine and prednisone taper. Follow-up with pulmonology scheduled for July.  Aspiration pneumonitis Patient continues on a regular diet with understanding of aspiration risk. Dysphagia likely related to dementia. Chest  x-ray suggests possible LLL pneumonia, and  patient was treated with antibiotics including Azithromycin, Unasyn and Augmenting but felt to have pneumonitis rather than pneumonia.  Rhinovirus URI Tracheobronchitis Improved.  Chronic atrial fibrillation Continue Coumadin and metoprolol  CAD S/p CABG. Continue metoprolol and Plavix. Not on statin secondary to allergy  Gout Continue allopurinol  Dementia with depression Continue Aricept, Namenda and Celexa  Hypokalemia Secondary to diuresis. Repleted as needed. Resume home potassium supplementation. Repeat BMP as an outpatient.  Hypothyroidism Continue Synthroid  CKD stage II GFR does not support diagnosis of stage III at this time.  Discharge Diagnoses:  Principal Problem:   Acute respiratory failure with hypoxia (HCC) Active Problems:   Hypothyroidism   COPD with acute exacerbation (HCC)   Dementia (HCC)   Acute on chronic diastolic CHF (congestive heart failure) (HCC)   Hypoxia   Aspiration pneumonitis (HCC)   Rhinovirus infection    Discharge Instructions  Discharge Instructions    (HEART FAILURE PATIENTS) Call MD:  Anytime you have any of the following symptoms: 1) 3 pound weight gain in 24 hours or 5 pounds in 1 week 2) shortness of breath, with or without a dry hacking cough 3) swelling in the hands, feet or stomach 4) if you have to sleep on extra pillows at night in order to breathe.   Complete by: As directed    Call MD for:  difficulty breathing, headache or visual disturbances   Complete by: As directed    Call MD for:  extreme fatigue   Complete by: As directed    Call MD for:  persistant dizziness or light-headedness   Complete by: As directed    Diet - low sodium heart healthy   Complete by: As directed    Increase activity slowly   Complete by: As directed      Allergies as of 08/04/2019      Reactions   Ezetimibe Other (See Comments)   Myalgia   Welchol [colesevelam Hcl] Other (See Comments)   Muscle aches   Adhesive [tape]  Itching, Rash, Other (See Comments)   Burning    Ceclor [cefaclor] Other (See Comments)   Unknown allergic reaction   Elastic Bandages & [zinc] Rash, Other (See Comments)   Turns red on the areas it touches   Latex Itching, Rash, Other (See Comments)   Burning    Lipitor [atorvastatin Calcium] Other (See Comments)   Increased fibromyalgia pain   Mevacor [lovastatin] Other (See Comments)   Increased fibromyalgia pain   Pravachol Other (See Comments)   Increased fibromyalgia pain   Vasotec Other (See Comments)   Unknown allergic reaction      Medication List    STOP taking these medications   celecoxib 200 MG capsule Commonly known as: CELEBREX   doxycycline 100 MG capsule Commonly known as: VIBRAMYCIN   traMADol 50 MG tablet Commonly known as: ULTRAM     TAKE these medications   allopurinol 300 MG tablet Commonly known as: ZYLOPRIM Take 300 mg by mouth at bedtime.   amLODipine 10 MG tablet Commonly known as: NORVASC Take 1 tablet (10 mg total) by mouth at bedtime. What changed:   medication strength  how much to take  how to take this  when to take this  additional instructions   citalopram 20 MG tablet Commonly known as: CELEXA TAKE 1 TABLET BY MOUTH TWICE A DAY   clopidogrel 75 MG tablet Commonly known as: PLAVIX TAKE 1 TABLET (75 MG TOTAL) BY MOUTH DAILY.  diphenoxylate-atropine 2.5-0.025 MG tablet Commonly known as: LOMOTIL Take 1 tablet by mouth every Monday, Wednesday, and Friday.   donepezil 5 MG tablet Commonly known as: ARICEPT Take 1 tablet (5 mg total) by mouth daily.   ergocalciferol 1.25 MG (50000 UT) capsule Commonly known as: VITAMIN D2 Take 50,000 Units by mouth 2 (two) times a week. Tuesday and Friday   fluticasone 50 MCG/ACT nasal spray Commonly known as: FLONASE Place 1 spray into both nostrils daily.   fluticasone furoate-vilanterol 100-25 MCG/INH Aepb Commonly known as: BREO ELLIPTA Inhale 1 puff into the lungs daily.    Flutter Devi Use as directed.   furosemide 80 MG tablet Commonly known as: LASIX Take 80 mg by mouth daily.   guaiFENesin 600 MG 12 hr tablet Commonly known as: MUCINEX Take 2 tablets (1,200 mg total) by mouth 2 (two) times daily for 3 days.   ipratropium-albuterol 0.5-2.5 (3) MG/3ML Soln Commonly known as: DUONEB Take 3 mLs by nebulization every 4 (four) hours as needed (Use at home).   levothyroxine 25 MCG tablet Commonly known as: SYNTHROID Take 25 mcg by mouth every morning.   losartan 25 MG tablet Commonly known as: COZAAR TAKE 1 TABLET BY MOUTH EVERY DAY   memantine 10 MG tablet Commonly known as: Namenda Take 1 tablet (10 mg total) by mouth 2 (two) times daily.   metoprolol succinate 25 MG 24 hr tablet Commonly known as: TOPROL-XL TAKE 1 TABLET BY MOUTH EVERY DAY What changed: when to take this   nitroGLYCERIN 0.4 MG SL tablet Commonly known as: NITROSTAT Place 1 tablet (0.4 mg total) under the tongue every 5 (five) minutes as needed for chest pain.   potassium chloride SA 20 MEQ tablet Commonly known as: Klor-Con M20 TAKE 1 TABLET EVERY OTHER DAY, ALTERNATING WITH 2 TABLETS EVERY OTHER DAY What changed:   how much to take  how to take this  when to take this  additional instructions   predniSONE 2.5 MG tablet Commonly known as: DELTASONE Take 2 tablets (5 mg total) by mouth daily with breakfast for 3 days, THEN 1 tablet (2.5 mg total) daily with breakfast for 5 days. Start taking on: August 05, 2019   ProAir HFA 108 (90 Base) MCG/ACT inhaler Generic drug: albuterol INHALE 2 PUFFS INTO LUNGS EVERY 6 HOURS AS NEEDED FOR WHEEZING   warfarin 5 MG tablet Commonly known as: COUMADIN Take as directed. If you are unsure how to take this medication, talk to your nurse or doctor. Original instructions: TAKE AS DIRECTED BY COUMADIN CLINIC What changed:   how much to take  how to take this  when to take this  additional instructions   ZINC-VITAMIN C  PO Take 1 tablet by mouth every evening.            Durable Medical Equipment  (From admission, onward)         Start     Ordered   08/04/19 1330  For home use only DME Nebulizer machine  Once       Question Answer Comment  Patient needs a nebulizer to treat with the following condition COPD exacerbation ([place])   Patient needs a nebulizer to treat with the following condition COPD (chronic obstructive pulmonary disease) ([place])   Length of Need Lifetime      08/04/19 1330          Follow-up Information    [facility] Follow up.   Specialty: Home Health Services Why: [facility] home with  follow you for Home Health. Please call [facility] for any questions or concerns. [phone]. Contact information: [address] [place] [address] [phone]        [facility] Follow up.   Why: This is [facility] for home [facility]xygen. Please call if you have any problems, question or concern.  Contact information: [address] [place] [address] [phone]        Lauraine Rinne, NP Follow up on 08/19/2019.   Specialty: Pulmonary Disease Why: Appt at 2:30 PM.  Please arrive at 2:15 for check in.  Contact information: [address] [place] [address] [phone]              Allergies  Allergen Reactions  . Ezetimibe Other (See Comments)    Myalgia  . Welchol [Colesevelam Hcl] Other (See Comments)    Muscle aches  . Adhesive [Tape] Itching, Rash and Other (See Comments)    Burning   . Ceclor [Cefaclor] Other (See Comments)    Unknown allergic reaction  . Elastic Bandages & [Zinc] Rash and Other (See Comments)    Turns red on the areas it touches  . Latex Itching, Rash and Other (See Comments)    Burning   . Lipitor [Atorvastatin Calcium] Other (See Comments)    Increased fibromyalgia pain  . Mevacor [Lovastatin] Other (See Comments)    Increased fibromyalgia pain  . Pravachol Other (See Comments)    Increased  fibromyalgia pain  . Vasotec Other (See Comments)    Unknown allergic reaction    Consultations:  Pulmonology   Procedures/Studies: DG Chest 2 View  Result Date: 07/23/2019 CLINICAL DATA:  Cough and shortness of breath EXAM: CHEST - 2 VIEW COMPARISON:  04/30/2019 FINDINGS: Post median sternotomy for CABG. Cardiomediastinal contours are stable enlarged. Persistent blunting of the LEFT costodiaphragmatic angle. Minimal LEFT basilar atelectasis. Lungs are otherwise clear. Visualized skeletal structures to the extent evaluated are unremarkable. IMPRESSION: Persistent blunting of the LEFT costodiaphragmatic angle, likely small effusion and/or atelectasis. Electronically Signed   By: Zetta Bills M.D.   On: 07/23/2019 17:20   CT Angio Chest PE W/Cm &/Or Wo Cm  Result Date: 07/23/2019 CLINICAL DATA:  Cough, shortness of breath, fatigue and weakness for 4 days. EXAM: CT ANGIOGRAPHY CHEST WITH CONTRAST TECHNIQUE: Multidetector CT imaging of the chest was performed using the standard protocol during bolus administration of intravenous contrast. Multiplanar CT image reconstructions and MIPs were obtained to evaluate the vascular anatomy. CONTRAST:  150mL OMNIPAQUE IOHEXOL 350 MG/ML SOLN COMPARISON:  Radiograph earlier this day.  Chest CT 04/28/2019 FINDINGS: Cardiovascular: There are no filling defects within the pulmonary arteries to suggest pulmonary embolus. Thoracic aortic atherosclerosis. Post CABG with mild cardiomegaly and calcification of native coronary arteries. Contrast refluxes into the hepatic veins and IVC. Mediastinum/Nodes: Patulous esophagus. No enlarged mediastinal or hilar lymph nodes. No visualized thyroid nodules. Lungs/Pleura: Significant debris within the mid trachea with fluid level, with debris extending into the left greater than right mainstem bronchi resultant and mild luminal narrowing. There is short segment complete occlusion of left lower lobe bronchus with distal bronchial  filling and mucoid impaction. Bronchial filling in the right lower lobe to a lesser extent. Trace left pleural effusion. Minimal subpleural irregular opacity in the right upper lobe is stable from prior exam and likely scarring. Upper Abdomen: Reflux of contrast into the hepatic veins and IVC. Upper abdominal atherosclerosis. Musculoskeletal: Post median sternotomy. There are multiple chronic compression fractures throughout  the thoracic spine, unchanged from prior. Healing left rib fractures. Review of the MIP images confirms the above findings. IMPRESSION: 1. No pulmonary embolus. 2. Significant debris within the mid trachea with fluid level, with debris extending into the left greater than right mainstem bronchi. There is short segment complete occlusion of left lower lobe bronchus with distal bronchial filling and mucoid impaction. Findings may be due to aspiration or mucous plugging. 3. Trace left pleural effusion. 4. Minimal peripheral opacity in the right upper lobe is unchanged from prior exam and suggestive of scarring. 5. Reflux of contrast into the hepatic veins and IVC consistent with elevated right heart pressures. Aortic Atherosclerosis (ICD10-I70.0). Electronically Signed   By: Keith Rake M.D.   On: 07/23/2019 22:51   DG CHEST PORT 1 VIEW  Result Date: 08/01/2019 CLINICAL DATA:  85 year old female with COPD. EXAM: PORTABLE CHEST 1 VIEW COMPARISON:  Chest radiograph dated 07/29/2019. FINDINGS: Small left pleural effusion with left lung base atelectasis. Pneumonia is not excluded. Clinical correlation is recommended. No pneumothorax. Mild cardiomegaly. Atherosclerotic calcification of the aorta. Median sternotomy wires and CABG vascular clips. No acute osseous pathology. IMPRESSION: Small left pleural effusion and left lung base atelectasis versus infiltrate. No significant interval change. Electronically Signed   By: Anner Crete M.D.   On: 08/01/2019 15:58   DG Chest Port 1  View  Result Date: 07/29/2019 CLINICAL DATA:  85 year old female with history of pneumonia. EXAM: PORTABLE CHEST 1 VIEW COMPARISON:  Chest x-ray 07/28/2019. FINDINGS: Left lower lobe airspace consolidation. Small left pleural effusion. Right lung is clear. No right pleural effusion. No pneumothorax. No definite suspicious appearing pulmonary nodules or masses are noted. No evidence of pulmonary edema. Heart size is normal. Upper mediastinal contours are within normal limits. IMPRESSION: 1. Left lower lobe pneumonia with small left parapneumonic pleural effusion, similar to the prior study. 2. Aortic atherosclerosis. Electronically Signed   By: Vinnie Langton M.D.   On: 07/29/2019 08:13   DG CHEST PORT 1 VIEW  Result Date: 07/28/2019 CLINICAL DATA:  Acute respiratory failure.  Coronary artery disease. EXAM: PORTABLE CHEST 1 VIEW COMPARISON:  07/27/2019 FINDINGS: Stable mild cardiomegaly. Prior CABG. Mild diffuse interstitial infiltrates show no significant change. Opacity in the left lung base is likely due to small left pleural effusion and atelectasis, although left lower lobe airspace disease cannot be excluded. These findings are also unchanged. IMPRESSION: 1. Stable mild cardiomegaly and mild diffuse interstitial infiltrates/edema. 2. Stable small left pleural effusion and left basilar atelectasis versus airspace disease. 3. No new or worsening disease identified. Electronically Signed   By: Marlaine Hind M.D.   On: 07/28/2019 09:15   DG CHEST PORT 1 VIEW  Result Date: 07/27/2019 CLINICAL DATA:  Shortness of breath EXAM: PORTABLE CHEST 1 VIEW COMPARISON:  July 26, 2019 FINDINGS: There is persistent airspace consolidation in the left lower lobe with small left pleural effusion. There is suspected mild layering effusion on the right. Lungs otherwise are clear. Heart is upper normal in size with pulmonary vascularity normal. No adenopathy. Patient is status post internal mammary bypass grafting. There is  aortic atherosclerosis. No evident pneumothorax. No bone lesions. IMPRESSION: Persistent airspace opacity/consolidation left lower lobe with small left pleural effusion. Suspect mild layering effusion on the right. Right otherwise clear. Stable cardiac silhouette. Postoperative changes noted. Aortic Atherosclerosis (ICD10-I70.0). Electronically Signed   By: Lowella Grip III M.D.   On: 07/27/2019 08:29   DG Chest Port 1 View  Result Date: 07/26/2019 CLINICAL DATA:  Cough EXAM: PORTABLE CHEST 1 VIEW COMPARISON:  Chest CT from 2 days ago FINDINGS: Trace left pleural effusion. No consolidation, edema, or air leak. CABG. Stable heart size and mediastinal contours IMPRESSION: Stable small volume effusion and atelectasis on the left. Electronically Signed   By: Monte Fantasia M.D.   On: 07/26/2019 05:14   DG Swallowing Func-Speech Pathology  Result Date: 07/25/2019 Objective Swallowing Evaluation: Type of Study: MBS-Modified Barium Swallow Study  Patient Details Name: Amber [name] MRN: [id number] Date of Birth: 1934/07/01 Today's Date: 07/25/2019 Time: SLP Start Time (ACUTE ONLY): 9563 -SLP Stop Time (ACUTE ONLY): 0855 SLP Time Calculation (min) (ACUTE ONLY): 23 min Past Medical History: Past Medical History: Diagnosis Date . Abnormality of gait 08/02/2014 . Cerebrovascular disease 05/21/2016 . Cervical spinal stenosis  . Chronic atrial fibrillation (HCC)   a. chronic/rate controlled;  b. chronic coumadin. . Chronic diastolic CHF (congestive heart failure) ([place])   a. 02/2013 Echo: EF 55-60%, mild AI, mod dil LA. . CKD (chronic kidney disease), stage III  . COPD with emphysema ([place])   PFT 05/02/10>>FEV1 1.35(62%), FEV1% 66, DLCO 75% . Coronary artery disease   a. s/p CABG;  b. Abnormal nuc 2015 - managed medically. . CVA (cerebral vascular accident) ([place])   left sided weakness . Depression  . Fibromyalgia  . GERD (gastroesophageal reflux disease)   pepcid 2-3 times per week . Gout  . Hemiparesis and alteration of  sensations as late effects of stroke ([place]) 01/17/2015 . History of melanoma   squamous cell, melanoma . Hyperlipidemia   a. statin intolerant, not felt to be candidate for PCSK9 due to chronically elevated CK levels. . Hypertension  . Hypertensive heart disease  . Hypothyroidism  . Insomnia  . Memory change 01/23/2014 . Nasal polyposis  . Osteoarthritis  . Osteoporosis  . Pneumonia   1990 . Tobacco abuse  Past Surgical History: Past Surgical History: Procedure Laterality Date . ABDOMINAL HYSTERECTOMY   . BREAST LUMPECTOMY  1980s  Benign lesion - right . carpel tunnel    right . CORONARY ARTERY BYPASS GRAFT  2000 . EYE SURGERY    bilateral cataracts . IR GENERIC HISTORICAL  03/31/2016  IR RADIOLOGIST EVAL & MGMT 03/31/2016 [facility]-INTERV RAD . RADIOLOGY WITH ANESTHESIA  12/24/2011  Procedure: RADIOLOGY WITH ANESTHESIA;  Surgeon: Medication Radiologist, MD;  Location: [facility];  Service: Radiology;  Laterality: N/A;  Extra Cranial Vascular Stent . RADIOLOGY WITH ANESTHESIA N/A 07/19/2014  Procedure: ANGIOPLASTY;  Surgeon: Luanne Bras, MD;  Location: [facility];  Service: Radiology;  Laterality: N/A; . RADIOLOGY WITH ANESTHESIA N/A 07/27/2014  Procedure: ANGIOPLASTY;  Surgeon: Luanne Bras, MD;  Location: [facility];  Service: Radiology;  Laterality: N/A; . TONSILLECTOMY   HPI: Amber [name]  is a 85 y.o. female with a known history of dementia, coronary artery disease, CVA, COPD, hypertension, hypothyroidism, chronic diastolic CHF and dyslipidemia, who presented to the emergency room with acute onset of recent worsening cough with associated dyspnea and inability to expectorate.  Chest CT concerning for aspiration PNA vs mucous plugging.  Pt completed an MBS on 02/17/2017 with recommendations for regular solids and thin liquids.  Subjective: Pt was alert Assessment / Plan / Recommendation CHL IP CLINICAL IMPRESSIONS 07/25/2019 Clinical Impression Patient presents with minimal oral dysphagia resulting in minimal delay in oral  transiting with intact pharyngeal swallow.  Mild pharyngo-cervical esophageal dysphagia characterized by prominent cricopharyngeus (See Rf 8, 86/170) with appearance of widened esophagus below did not trap barium at this time.  Radiologist not  present to confirm.  Fortunately nearly 100% of barium flowed through this area - including tablet, pudding and solids.  But would advise to monitor for CP dysfunction impacts closely.  Pt did not report symptoms of dysphagia during testing. She did produce cough x2 once prior to [facility] and 2nd time during MBS- no aspiration or penetration of barium observed. SLP would advise pt to monitor however to assure tolerating well.  No aspiration or penetration of any consistency tested noted.  Pt is noted to become dyspenic and thus her swallow/respiration reciprocity may be compromised at times allowing episodic aspiration.  Due to weakness and dyspnea, recommend advance diet to dys3/thin with strict precautions. SLP Visit Diagnosis Dysphagia, pharyngoesophageal phase (R13.14);Dysphagia, oral phase (R13.11) Attention and concentration deficit following -- Frontal lobe and executive function deficit following -- Impact on safety and function Moderate aspiration risk   CHL IP TREATMENT RECOMMENDATION 07/24/2019 Treatment Recommendations Defer until completion of intrumental exam   Prognosis 07/25/2019 Prognosis for Safe Diet Advancement Fair Barriers to Reach Goals Cognitive deficits Barriers/Prognosis Comment -- CHL IP DIET RECOMMENDATION 07/25/2019 SLP Diet Recommendations Dysphagia 3 (Mech soft) solids;Thin liquid Liquid Administration via -- Medication Administration (No Data) Compensations Minimize environmental distractions;Slow rate;Small sips/bites Postural Changes Remain semi-upright after after feeds/meals (Comment);Seated upright at 90 degrees   CHL IP OTHER RECOMMENDATIONS 07/25/2019 Recommended Consults -- Oral Care Recommendations Oral care before and after PO Other  Recommendations Have oral suction available   CHL IP FOLLOW UP RECOMMENDATIONS 07/25/2019 Follow up Recommendations Skilled Nursing facility;24 hour supervision/assistance   CHL IP FREQUENCY AND DURATION 07/25/2019 Speech Therapy Frequency (ACUTE ONLY) min 2x/week Treatment Duration 2 weeks      CHL IP ORAL PHASE 07/25/2019 Oral Phase WFL Oral - Pudding Teaspoon -- Oral - Pudding Cup -- Oral - Honey Teaspoon -- Oral - Honey Cup -- Oral - Nectar Teaspoon -- Oral - Nectar Cup WFL Oral - Nectar Straw -- Oral - Thin Teaspoon WFL Oral - Thin Cup WFL;Piecemeal swallowing Oral - Thin Straw WFL Oral - Puree WFL Oral - Mech Soft Delayed oral transit;Premature spillage;WFL Oral - Regular -- Oral - Multi-Consistency -- Oral - Pill Other (Comment) Oral Phase - Comment --  CHL IP PHARYNGEAL PHASE 07/25/2019 Pharyngeal Phase Impaired Pharyngeal- Pudding Teaspoon -- Pharyngeal -- Pharyngeal- Pudding Cup -- Pharyngeal -- Pharyngeal- Honey Teaspoon -- Pharyngeal -- Pharyngeal- Honey Cup -- Pharyngeal -- Pharyngeal- Nectar Teaspoon -- Pharyngeal -- Pharyngeal- Nectar Cup WFL;Delayed swallow initiation-vallecula Pharyngeal Material does not enter airway Pharyngeal- Nectar Straw -- Pharyngeal -- Pharyngeal- Thin Teaspoon WFL Pharyngeal Material does not enter airway Pharyngeal- Thin Cup [facility] Pharyngeal Material does not enter airway Pharyngeal- Thin Straw WFL Pharyngeal Material does not enter airway Pharyngeal- Puree Delayed swallow initiation-vallecula Pharyngeal Material does not enter airway Pharyngeal- Mechanical Soft Delayed swallow initiation-vallecula Pharyngeal Material does not enter airway Pharyngeal- Regular -- Pharyngeal -- Pharyngeal- Multi-consistency -- Pharyngeal -- Pharyngeal- Pill WFL Pharyngeal Material does not enter airway Pharyngeal Comment --  CHL IP CERVICAL ESOPHAGEAL PHASE 07/25/2019 Cervical Esophageal Phase Impaired Pudding Teaspoon -- Pudding Cup -- Honey Teaspoon -- Honey Cup -- Nectar Teaspoon -- Nectar Cup  Prominent cricopharyngeal segment Nectar Straw -- Thin Teaspoon Prominent cricopharyngeal segment Thin Cup Prominent cricopharyngeal segment Thin Straw Prominent cricopharyngeal segment Puree Prominent cricopharyngeal segment Mechanical Soft Prominent cricopharyngeal segment Regular -- Multi-consistency -- Pill Prominent cricopharyngeal segment Cervical Esophageal Comment Patient appears with prominent cricopharyngeus with widening of esophagus below concerning for potential CP achalasia.  Radiologist not present to confirm.  Fortunately nearly 100% of barium flowed through this area - including tablet, pudding and solids.  But would advise to monitor for CP dysfunction impacts closely.  Pt did not report symptoms of dysphagia during testing. She did produce cough x2 once prior to [facility] and 2nd time during [facility]- no aspiration or penetration of barium observed. Amber Lime, MS [facility] SLP Acute Rehab Services Office [phone] [name] 07/25/2019, 10:09 AM                 Subjective: No dyspnea. Some cough but improved.  Discharge Exam: Vitals:   08/04/19 0818 08/04/19 1051  BP:  (!) 147/71  Pulse:  71  Resp:    Temp:    SpO2: 96%    Vitals:   08/03/19 2028 08/04/19 0609 08/04/19 0818 08/04/19 1051  BP:  118/72  (!) 147/71  Pulse:  74  71  Resp:  14    Temp:  98.2 F (36.8 C)    TempSrc:  Oral    SpO2: 96% 96% 96%   Weight:      Height:        General: Pt is alert, awake, not in acute distress Cardiovascular: RRR, S1/S2 +, no rubs, no gallops Respiratory: Rales at bases. No wheezing. Abdominal: Soft, NT, ND, bowel sounds + Extremities: minimal LE edema, no cyanosis    The results of significant diagnostics from this hospitalization (including imaging, microbiology, ancillary and laboratory) are listed below for reference.     Microbiology: No results found for this or any previous visit (from the past 240 hour(s)).   Labs: BNP (last 3 results) Recent Labs     07/23/19 2017  BNP 110.3*   Basic Metabolic Panel: Recent Labs  Lab 07/30/19 0453 07/31/19 0656 08/01/19 0543 08/02/19 0547 08/03/19 0746  NA 138 137 136 137 137  K 2.8* 4.2 4.1 4.6 4.4  CL 101 106 97* 100 99  CO2 27 25 24 27 28   GLUCOSE 95 99 106* 102* 94  BUN 12 15 15 19 20   CREATININE 0.63 0.68 0.69 0.84 0.88  CALCIUM 8.3* 8.6* 8.9 9.0 8.8*  MG  --  2.5* 2.3 2.3  --    Liver Function Tests: Recent Labs  Lab 07/29/19 0438 08/01/19 0543  AST 30 49*  ALT 26 48*  ALKPHOS 50 50  BILITOT 0.8 0.7  PROT 6.3* 6.3*  ALBUMIN 3.6 3.6   No results for input(s): LIPASE, AMYLASE in the last 168 hours. No results for input(s): AMMONIA in the last 168 hours. CBC: Recent Labs  Lab 07/29/19 0438 07/30/19 0453 08/01/19 0543 08/02/19 0547  WBC 9.7 8.2 9.1 11.0*  HGB 12.0 11.9* 11.6* 11.9*  HCT 37.5 36.6 36.1 38.0  MCV 91.2 91.3 90.9 91.3  PLT 250 285 339 362   Cardiac Enzymes: No results for input(s): CKTOTAL, CKMB, CKMBINDEX, TROPONINI in the last 168 hours. BNP: Invalid input(s): POCBNP CBG: No results for input(s): GLUCAP in the last 168 hours. D-Dimer No results for input(s): DDIMER in the last 72 hours. Hgb A1c No results for input(s): HGBA1C in the last 72 hours. Lipid Profile No results for input(s): CHOL, HDL, LDLCALC, TRIG, CHOLHDL, LDLDIRECT in the last 72 hours. Thyroid function studies No results for input(s): TSH, T4TOTAL, T3FREE, THYROIDAB in the last 72 hours.  Invalid input(s): FREET3 Anemia work up No results for input(s): VITAMINB12, FOLATE, FERRITIN, TIBC, IRON, RETICCTPCT in the last 72 hours. Urinalysis    Component Value Date/Time   COLORURINE YELLOW  07/23/2019 2137   APPEARANCEUR CLEAR 07/23/2019 2137   LABSPEC 1.014 07/23/2019 2137   PHURINE 6.0 07/23/2019 2137   GLUCOSEU NEGATIVE 07/23/2019 2137   GLUCOSEU NEGATIVE 02/28/2013 1235   HGBUR NEGATIVE 07/23/2019 2137   BILIRUBINUR NEGATIVE 07/23/2019 2137   KETONESUR NEGATIVE 07/23/2019  2137   PROTEINUR NEGATIVE 07/23/2019 2137   UROBILINOGEN 0.2 02/28/2013 1235   NITRITE NEGATIVE 07/23/2019 2137   LEUKOCYTESUR SMALL (A) 07/23/2019 2137   Sepsis Labs Invalid input(s): PROCALCITONIN,  WBC,  LACTICIDVEN Microbiology No results found for this or any previous visit (from the past 240 hour(s)).   Time coordinating discharge: 35 minutes  SIGNED:   Cordelia Poche, MD [facility] 08/04/2019, 1:31 PM

## 2019-08-04 NOTE — Discharge Instructions (Signed)
Amber [name],  You are in the hospital because of poor oxygen levels.  He required treatment with oxygen.  This appears to have been secondary to multitude of issues including a virus infection, heart failure, aspiration, COPD exacerbation.  You were treated with breathing treatments, Lasix, steroids, and antibiotics and oxygen therapy.  You improved with time and continued therapy.  You will be discharged home with no changes to your Lasix dosing, addition of Brio Ellipta for your COPD, a taper of prednisone over the next week with follow-up with the pulmonologist.  You will be discharged with home health therapy as well.

## 2019-08-04 NOTE — Care Management Important Message (Signed)
Important Message  Patient Details IM Letter given to Marney Doctor RN Case Manager to present to the Patient Name: AZARI JANSSENS MRN: [id number] Date of Birth: [birth year]   Medicare Important Message Given:  Yes     Kerin Salen 08/04/2019, 9:43 AM

## 2019-08-04 NOTE — Progress Notes (Signed)
Physical Therapy Treatment Patient Details Name: Amber [name] MRN: [id number] DOB: Nov 08, 1934 Today's Date: 08/04/2019    History of Present Illness 85 y.o. female with a history of chronic AFib on coumadin, stage IIIa CKD, COPD with emphysema, CAD, chronic HFpEF, HTN, hypothyroidism, GERD, osteoarthritis and fibromyalgia, moderate dementia, CVA with left weakness, fall down stairs in March 2021 resulting in left rib fx and T2, 6,9 compression fractures. Pt presented from home due to several days of increasing wet cough and dyspnea. On evaluation she was afebrile with hypoxia.  Pt admitted for Acute hypoxemic respiratory failure due to aspiration pneumonia, pneumonitis and tracheobronchitis on COPD without acute exacerbation    PT Comments    Pt OOB in recliner with very attentive daughter in room who is a Marine scientist.  General transfer comment: verbal cues to push from armrests, min A to power up, cues to weight shift forward once standing.  Slight posterior LOB.  Assisted to [facility].  Pt able to perform self peri care but with Min Assist to maintain balance. General Gait Details: Intermittent assist to steady. Pt toleratd distance well.  Avg RA was 88% with 1/4 dyspnea. Pt plans to return home with daughter.   Follow Up Recommendations  Home health PT;Supervision/Assistance - 24 hour     Equipment Recommendations  None recommended by PT    Recommendations for Other Services       Precautions / Restrictions Precautions Precautions: Fall Precaution Comments: L AFO, hx of L deficits since CVA; monitor sats    Mobility  Bed Mobility               General bed mobility comments: OOB in recliner  Transfers Overall transfer level: Needs assistance Equipment used: Rolling walker (2 wheeled) Transfers: Sit to/from [facility] Sit to Stand: Min guard;Min assist Stand pivot transfers: Min assist       General transfer comment: verbal cues to push from armrests, min A to  power up, cues to weight shift forward once standing.  Slight posterior LOB.  Assisted to [facility].  Pt able to perform self peri care but with Min Assist to maintain balance.  Ambulation/Gait Ambulation/Gait assistance: Min guard Gait Distance (Feet): 130 Feet Assistive device: Rolling walker (2 wheeled) Gait Pattern/deviations: Step-through pattern;Decreased stride length Gait velocity: decreased   General Gait Details: Intermittent assist to steady. Pt toleratd distance well.  Avg RA was 88% with 1/4 dyspnea.   Stairs             Wheelchair Mobility    Modified Rankin (Stroke Patients Only)       Balance                                            Cognition Arousal/Alertness: Awake/alert Behavior During Therapy: WFL for tasks assessed/performed Overall Cognitive Status: Within Functional Limits for tasks assessed                                 General Comments: AxO x 3 pleasant      Exercises      General Comments        Pertinent Vitals/Pain      Home Living                      Prior Function  PT Goals (current goals can now be found in the care plan section) Progress towards PT goals: Progressing toward goals    Frequency    Min 3X/week      PT Plan Discharge plan needs to be updated    Co-evaluation              AM-PAC PT "6 Clicks" Mobility   Outcome Measure  Help needed turning from your back to your side while in a flat bed without using bedrails?: A Little Help needed moving from lying on your back to sitting on the side of a flat bed without using bedrails?: A Little Help needed moving to and from a bed to a chair (including a wheelchair)?: A Little Help needed standing up from a chair using your arms (e.g., wheelchair or bedside chair)?: A Little Help needed to walk in hospital room?: A Little Help needed climbing 3-5 steps with a railing? : A Little 6 Click Score: 18     End of Session Equipment Utilized During Treatment: Gait belt Activity Tolerance: Patient tolerated treatment well Patient left: in chair;with call bell/phone within reach;with family/visitor present Nurse Communication: Mobility status PT Visit Diagnosis: Other abnormalities of gait and mobility (R26.89)     Time: 5329-9242 PT Time Calculation (min) (ACUTE ONLY): 29 min  Charges:  $Gait Training: 8-22 mins $Therapeutic Activity: 8-22 mins                     Rica Koyanagi  PTA Acute  Rehabilitation Services Pager      [phone] Office      [phone]

## 2019-08-04 NOTE — TOC Transition Note (Addendum)
Transition of Care [facility]) - CM/SW Discharge Note   Patient Details  Name: Amber [name] MRN: [id number] Date of Birth: [birth year]  Transition of Care [facility]) CM/SW Contact:  Keeshia Sanderlin, Marjie Skiff, RN Phone Number: 08/04/2019, 1:45 PM   Clinical Narrative:    Pt to dc back home with daughter and HHPT/OT by [facility] at home. Home nebulizer machine ordered but pt states that she already has one at home.    Barriers to Discharge: No Barriers Identified   Patient Goals and CMS Choice Patient states their goals for this hospitalization and ongoing recovery are:: Pt is only oriented to herself   Choice offered to / list presented to : Adult Children  Discharge Placement                       Discharge Plan and Services     Post Acute Care Choice: Skilled Nursing Facility                               Social Determinants of Health (SDOH) Interventions     Readmission Risk Interventions No flowsheet data found.

## 2019-08-04 NOTE — Progress Notes (Signed)
OT Cancellation Note  Patient Details Name: Amber [name] MRN: [id number] DOB: May 04, 1934   Cancelled Treatment:    Reason Eval/Treat Not Completed: Other (comment) Upon arrival pt dressed, seated in chair and DTR present. Report discharging today, declines any further questions/concerns at this time "just waiting to go home."   Delbert Phenix OT Pager: [facility] 08/04/2019, 2:18 PM

## 2019-08-05 ENCOUNTER — Telehealth: Payer: Self-pay | Admitting: Interventional Cardiology

## 2019-08-05 NOTE — Telephone Encounter (Signed)
The request about Lasix is difficult to measure.  She has both lung disease and heart failure.  Either or both could be causing recurrent wheezing.  The risk of too much Lasix is dehydration and worsening.  My best recommendation would be to use 80 mg twice a day today and tomorrow then back to 80 mg/day on Sunday and Monday.  She will be seen in the office

## 2019-08-05 NOTE — Telephone Encounter (Signed)
Spoke with daughter and she states pt last had IV Lasix Wednesday daytime and was given PO Lasix that evening.  Pt d/c'ed home yesterday with no wheezing.  Last night wheezing started and daughter gave pt an additional 40mg  of Lasix.  Pt still currently sleeping so unsure how wheezing is doing now.  Daughter very concerned because something similar happened a couple of years ago and pt was very ill.  Daughter wanted to know if pt needed extra Lasix to get her through the weekend until her appt on Monday.  Advised I would speak with Dr. Tamala Julian and call back.  Daughter appreciative for call.

## 2019-08-05 NOTE — Telephone Encounter (Signed)
Daughter of the patient called. The patient was just discharged from the hospital for CHF. She was wheezing and was on IV Lasix and transitioned to Oral Lasix before discharge. The daughter is worried that she may not have been in the hospital long enough to transition to the oral lasix to pull the fluid off. The patient is starting to have a wet, crackling cough. The daughter wanted to know if she needed to up the patients oral lasix.  She is scheduled to see Dr. Tamala Julian on Monday 06/28 but the daughter would like to get the patient in today if possible

## 2019-08-05 NOTE — Telephone Encounter (Signed)
Spoke with daughter and made her aware of recommendations.  Daughter verbalized understanding and was appreciative for call.  

## 2019-08-07 NOTE — Progress Notes (Signed)
Cardiology Office Note:    Date:  08/08/2019   ID:  Amber [name], DOB 03-Jun-1934, MRN [id number]  PCP:  Antony Contras, MD  Cardiologist:  Sinclair Grooms, MD   Referring MD: Antony Contras, MD   Chief Complaint  Patient presents with  . Congestive Heart Failure    History of Present Illness:    Amber [name] is a 85 y.o. female with a hx of coronary artery disease with prior CABG in 2000, chronic atrial fibrillation, chronic diastolic heart failure, chronic kidney disease, stage III, hyperlipidemia (statin intolerant), and essential hypertensionwith hypertensive heart disease, cerebrovascular disease, and dementia.  Recent admission 07/23/19 with respiratory failure presenting with cough/fever/hypoxia treated as bronchitis / aspiration/CHF. Stable diuretic regimen was not established during the admission. Daughter called 08/05/2019 c/o patient wheezing one day after discharge. Lasix 80 mg po BID x 4 doses then back to 80 mg /day recommended. Here today for f/u.  She is accompanied by her daughter Loss adjuster, chartered).  We instructed Lasix as noted above however this did not occur.  40 mg was given orally as a second dose 1 day.  Subsequently she has received 80 mg each morning.  She is somewhat lethargic today.  Her blood pressure is relatively low.  She feels that the extra dose of Lasix cleared up wheezing that she was having on the day of the phone call that related concerns.  Despite amlodipine being increased to 10 mg daily, the daughter has changed the dose to 5 mg which was being used prior to admission.  Past Medical History:  Diagnosis Date  . Abnormality of gait 08/02/2014  . Cerebrovascular disease 05/21/2016  . Cervical spinal stenosis   . Chronic atrial fibrillation (HCC)    a. chronic/rate controlled;  b. chronic coumadin.  . Chronic diastolic CHF (congestive heart failure) ([place])    a. 02/2013 Echo: EF 55-60%, mild AI, mod dil LA.  . CKD (chronic kidney disease), stage III   .  COPD with emphysema ([place])    PFT 05/02/10>>FEV1 1.35(62%), FEV1% 66, DLCO 75%  . Coronary artery disease    a. s/p CABG;  b. Abnormal nuc 2015 - managed medically.  . CVA (cerebral vascular accident) ([place])    left sided weakness  . Depression   . Fibromyalgia   . GERD (gastroesophageal reflux disease)    pepcid 2-3 times per week  . Gout   . Hemiparesis and alteration of sensations as late effects of stroke ([place]) 01/17/2015  . History of melanoma    squamous cell, melanoma  . Hyperlipidemia    a. statin intolerant, not felt to be candidate for PCSK9 due to chronically elevated CK levels.  . Hypertension   . Hypertensive heart disease   . Hypothyroidism   . Insomnia   . Memory change 01/23/2014  . Nasal polyposis   . Osteoarthritis   . Osteoporosis   . Pneumonia    1990  . Tobacco abuse     Past Surgical History:  Procedure Laterality Date  . ABDOMINAL HYSTERECTOMY    . BREAST LUMPECTOMY  1980s   Benign lesion - right  . carpel tunnel     right  . CORONARY ARTERY BYPASS GRAFT  2000  . EYE SURGERY     bilateral cataracts  . IR GENERIC HISTORICAL  03/31/2016   IR RADIOLOGIST EVAL & MGMT 03/31/2016 [facility]INTER[facility]  . RADIOLOGY WITH ANESTHESIA  12/24/2011   Procedure: RADIOLOGY WITH ANESTHESIA;  Surgeon: Medication Radiologist, MD;  Location:  [facility];  Service: Radiology;  Laterality: N/A;  Extra Cranial Vascular Stent  . RADIOLOGY WITH ANESTHESIA N/A 07/19/2014   Procedure: ANGIOPLASTY;  Surgeon: Luanne Bras, MD;  Location: [facility];  Service: Radiology;  Laterality: N/A;  . RADIOLOGY WITH ANESTHESIA N/A 07/27/2014   Procedure: ANGIOPLASTY;  Surgeon: Luanne Bras, MD;  Location: [facility];  Service: Radiology;  Laterality: N/A;  . TONSILLECTOMY      Current Medications: Current Meds  Medication Sig  . allopurinol (ZYLOPRIM) 300 MG tablet Take 300 mg by mouth at bedtime.   . citalopram (CELEXA) 20 MG tablet TAKE 1 TABLET BY MOUTH TWICE A DAY  . clopidogrel (PLAVIX) 75 MG  tablet TAKE 1 TABLET (75 MG TOTAL) BY MOUTH DAILY.  . diphenoxylate-atropine (LOMOTIL) 2.5-0.025 MG tablet Take 1 tablet by mouth every Monday, Wednesday, and Friday.   . donepezil (ARICEPT) 5 MG tablet Take 1 tablet (5 mg total) by mouth daily.  . ergocalciferol (VITAMIN D2) 50000 UNITS capsule Take 50,000 Units by mouth 2 (two) times a week. Tuesday and Friday  . fluticasone (FLONASE) 50 MCG/ACT nasal spray Place 1 spray into both nostrils daily.   . fluticasone furoate-vilanterol (BREO ELLIPTA) 100-25 MCG/INH AEPB Inhale 1 puff into the lungs daily.  . furosemide (LASIX) 80 MG tablet Take 80 mg by mouth daily.   [name] ipratropium-albuterol (DUONEB) 0.5-2.5 (3) MG/3ML SOLN Take 3 mLs by nebulization every 4 (four) hours as needed (Use at home).  [name] levothyroxine (SYNTHROID, LEVOTHROID) 25 MCG tablet Take 25 mcg by mouth every morning.   [name] losartan (COZAAR) 25 MG tablet TAKE 1 TABLET BY MOUTH EVERY DAY  . memantine (NAMENDA) 10 MG tablet Take 1 tablet (10 mg total) by mouth 2 (two) times daily.  . metoprolol succinate (TOPROL-XL) 25 MG 24 hr tablet TAKE 1 TABLET BY MOUTH EVERY DAY  . nitroGLYCERIN (NITROSTAT) 0.4 MG SL tablet Place 1 tablet (0.4 mg total) under the tongue every 5 (five) minutes as needed for chest pain.  . potassium chloride SA (KLOR-CON M20) 20 MEQ tablet TAKE 1 TABLET EVERY OTHER DAY, ALTERNATING WITH 2 TABLETS EVERY OTHER DAY  . predniSONE (DELTASONE) 2.5 MG tablet Take 2 tablets (5 mg total) by mouth daily with breakfast for 3 days, THEN 1 tablet (2.5 mg total) daily with breakfast for 5 days.  [name] PROAIR HFA 108 (90 BASE) MCG/ACT inhaler INHALE 2 PUFFS INTO LUNGS EVERY 6 HOURS AS NEEDED FOR WHEEZING  . Respiratory Therapy Supplies (FLUTTER) DEVI Use as directed.  . warfarin (COUMADIN) 5 MG tablet TAKE AS DIRECTED BY COUMADIN CLINIC  . ZINC-VITAMIN C PO Take 1 tablet by mouth every evening.  . [DISCONTINUED] amLODipine (NORVASC) 10 MG tablet Take 5 mg by mouth daily.      Allergies:   Ezetimibe, Welchol [colesevelam hcl], Adhesive [tape], Ceclor [cefaclor], Elastic bandages & [zinc], Latex, Lipitor [atorvastatin calcium], Mevacor [lovastatin], Pravachol, and Vasotec   Social History   Socioeconomic History  . Marital status: Married    Spouse name: Not on file  . Number of children: 2  . Years of education: Not on file  . Highest education level: Not on file  Occupational History  . Occupation: Retired    Fish farm manager: [employer]  . Occupation: Network engineer    Comment: Retired  Tobacco Use  . Smoking status: Former Smoker    Packs/day: 0.40    Years: 25.00    Pack years: 10.00    Types: Cigarettes    Quit date: 02/10/1977  Years since quitting: 42.5  . Smokeless tobacco: Never Used  Vaping Use  . Vaping Use: Never used  Substance and Sexual Activity  . Alcohol use: No  . Drug use: No  . Sexual activity: Not on file  Other Topics Concern  . Not on file  Social History Narrative   Patient is right handed.   Patient lives at home with husband   Patient drinks 8 oz caffeine occasionally.   Social Determinants of Health   Financial Resource Strain:   . Difficulty of Paying Living Expenses:   Food Insecurity:   . Worried About Charity fundraiser in the Last Year:   . Arboriculturist in the Last Year:   Transportation Needs:   . Film/video editor (Medical):   [name] Lack of Transportation (Non-Medical):   Physical Activity:   . Days of Exercise per Week:   . Minutes of Exercise per Session:   Stress:   . Feeling of Stress :   Social Connections:   . Frequency of Communication with Friends and Family:   . Frequency of Social Gatherings with Friends and Family:   . Attends Religious Services:   . Active Member of Clubs or Organizations:   . Attends Archivist Meetings:   [name] Marital Status:      Family History: The patient's family history includes CAD in her brother and father; Diabetes type II in her brother;  Stroke in her mother.  ROS:   Please see the history of present illness.    The patient voices no complaints.  She is having no respiratory issues.  The daughter feels that her recent hypoxia and respiratory difficulties were related to a superimposed URI.  All other systems reviewed and are negative.  EKGs/Labs/Other Studies Reviewed:    The following studies were reviewed today: No new data.  Hospital data from recent admission is reviewed.  EKG:  EKG performed July 25, 2019 demonstrates atrial fibrillation with nonspecific ST sagging.  Recent Labs: 07/23/2019: B Natriuretic Peptide 513.1 08/01/2019: ALT 48 08/02/2019: Hemoglobin 11.9; Magnesium 2.3; Platelets 362 08/03/2019: BUN 20; Creatinine, Ser 0.88; Potassium 4.4; Sodium 137  Recent Lipid Panel    Component Value Date/Time   CHOL 228 (H) 03/06/2017 0410   TRIG 34 03/06/2017 0410   HDL 62 03/06/2017 0410   CHOLHDL 3.7 03/06/2017 0410   VLDL 7 03/06/2017 0410   LDLCALC 159 (H) 03/06/2017 0410   LDLDIRECT 236.0 08/02/2014 1418    Physical Exam:    VS:  BP (!) 102/54   Pulse 76   Ht 5' 6"  (1.676 m)   Wt 143 lb 3.2 oz (65 kg)   SpO2 97%   BMI 23.11 kg/m     Wt Readings from Last 3 Encounters:  08/08/19 143 lb 3.2 oz (65 kg)  08/03/19 142 lb 1.6 oz (64.5 kg)  04/28/19 149 lb (67.6 kg)     GEN: Elderly and frail. No acute distress HEENT: Normal NECK: No JVD. LYMPHATICS: No lymphadenopathy CARDIAC: Irregularly irregular RR with systolic murmur in the left mid sternal region.No gallop, or edema. VASCULAR:  Normal Pulses. No bruits. RESPIRATORY:  Clear to auscultation without rales, wheezing or rhonchi  ABDOMEN: Soft, non-tender, non-distended, No pulsatile mass, MUSCULOSKELETAL: No deformity  SKIN: Warm and dry NEUROLOGIC:  Alert and oriented x 3 PSYCHIATRIC:  Normal affect   ASSESSMENT:    1. Chronic diastolic heart failure ([place])   2. Coronary artery disease involving coronary bypass graft of  native heart  with angina pectoris ([place])   3. Chronic atrial fibrillation ([place])   4. Essential hypertension   5. Long term current use of anticoagulant therapy   6. Dyslipidemia   7. Dementia without behavioral disturbance, unspecified dementia type ([place])   8. Hemiparesis and alteration of sensations as late effects of stroke ([place])    PLAN:    In order of problems listed above:  1. No clinical evidence of volume overload.  Basic metabolic panel will be done today relative to the lowish blood pressure.  Continue furosemide 80 mg/day.  She received an additional 40 mg on Friday evening. 2. Reviewed secondary prevention. 3. Continue Coumadin looking for bleeding.  Continue Toprol-XL for rate control. 4. Decrease amlodipine to 10 mg/day.  Continue furosemide 80 mg/day. 5. Continue Coumadin. 6. Unable to tolerate statin therapy. 7. Not communicative today. 8. No change in neuro status other than some lethargy today.  Be met will help ensure that she is not dehydrated/hyponatremic.   As needed follow-up.  Lasix 80 mg/day.  Decrease amlodipine to 5 mg/day for improved blood pressure.   Medication Adjustments/Labs and Tests Ordered: Current medicines are reviewed at length with the patient today.  Concerns regarding medicines are outlined above.  Orders Placed This Encounter  Procedures  . Basic metabolic panel   Meds ordered this encounter  Medications  . amLODipine (NORVASC) 5 MG tablet    Sig: Take 1 tablet (5 mg total) by mouth daily.    Dispense:  90 tablet    Refill:  3    Dose change    Patient Instructions  Medication Instructions:  1) DECREASE Amlodipine to 24m once daily  *If you need a refill on your cardiac medications before your next appointment, please call your pharmacy*   Lab Work: BMET today  If you have labs (blood work) drawn today and your tests are completely normal, you will receive your results only by: .Marland KitchenMyChart Message (if you have MyChart) OR . A paper copy in  the mail If you have any lab test that is abnormal or we need to change your treatment, we will call you to review the results.   Testing/Procedures: None   Follow-Up: [facility] and your [facility] needs are our [facility].  As part of our continuing mission to provide you with exceptional heart care, we have created designated Provider Care Teams.  These Care Teams include your primary Cardiologist (physician) and Advanced Practice Providers (APPs -  Physician Assistants and Nurse Practitioners) who all work together to provide you with the care you need, when you need it.  We recommend signing up for the patient portal called "MyChart".  Sign up information is provided on this After Visit Summary.  MyChart is used to connect with patients for Virtual Visits (Telemedicine).  Patients are able to view lab/test results, encounter notes, upcoming appointments, etc.  Non-urgent messages can be sent to your provider as well.   To learn more about what you can do with MyChart, go to [email or web address]    Your next appointment:   4 month(s)  The format for your next appointment:   In Person  Provider:   LTruitt Merle NP, LCecilie Kicks NP or JKathyrn Drown NP    Other Instructions      Signed, HSinclair Grooms MD  08/08/2019 10:58 AM    [place]

## 2019-08-08 ENCOUNTER — Ambulatory Visit (INDEPENDENT_AMBULATORY_CARE_PROVIDER_SITE_OTHER): Payer: Medicare PPO

## 2019-08-08 ENCOUNTER — Ambulatory Visit: Payer: Medicare PPO | Admitting: Interventional Cardiology

## 2019-08-08 ENCOUNTER — Other Ambulatory Visit: Payer: Self-pay

## 2019-08-08 ENCOUNTER — Encounter: Payer: Self-pay | Admitting: Interventional Cardiology

## 2019-08-08 VITALS — BP 102/54 | HR 76 | Ht 66.0 in | Wt 143.2 lb

## 2019-08-08 DIAGNOSIS — I10 Essential (primary) hypertension: Secondary | ICD-10-CM

## 2019-08-08 DIAGNOSIS — Z5181 Encounter for therapeutic drug level monitoring: Secondary | ICD-10-CM

## 2019-08-08 DIAGNOSIS — Z7901 Long term (current) use of anticoagulants: Secondary | ICD-10-CM | POA: Diagnosis not present

## 2019-08-08 DIAGNOSIS — I25709 Atherosclerosis of coronary artery bypass graft(s), unspecified, with unspecified angina pectoris: Secondary | ICD-10-CM | POA: Diagnosis not present

## 2019-08-08 DIAGNOSIS — F0390 Unspecified dementia without behavioral disturbance: Secondary | ICD-10-CM | POA: Diagnosis not present

## 2019-08-08 DIAGNOSIS — E785 Hyperlipidemia, unspecified: Secondary | ICD-10-CM | POA: Diagnosis not present

## 2019-08-08 DIAGNOSIS — I69398 Other sequelae of cerebral infarction: Secondary | ICD-10-CM

## 2019-08-08 DIAGNOSIS — I4891 Unspecified atrial fibrillation: Secondary | ICD-10-CM

## 2019-08-08 DIAGNOSIS — I69359 Hemiplegia and hemiparesis following cerebral infarction affecting unspecified side: Secondary | ICD-10-CM | POA: Diagnosis not present

## 2019-08-08 DIAGNOSIS — I5032 Chronic diastolic (congestive) heart failure: Secondary | ICD-10-CM

## 2019-08-08 DIAGNOSIS — I482 Chronic atrial fibrillation, unspecified: Secondary | ICD-10-CM

## 2019-08-08 LAB — BASIC METABOLIC PANEL
BUN/Creatinine Ratio: 13 (ref 12–28)
BUN: 14 mg/dL (ref 8–27)
CO2: 25 mmol/L (ref 20–29)
Calcium: 9.2 mg/dL (ref 8.7–10.3)
Chloride: 100 mmol/L (ref 96–106)
Creatinine, Ser: 1.07 mg/dL — ABNORMAL HIGH (ref 0.57–1.00)
GFR calc Af Amer: 56 mL/min/{1.73_m2} — ABNORMAL LOW (ref >59)
GFR calc non Af Amer: 48 mL/min/{1.73_m2} — ABNORMAL LOW (ref >59)
Glucose: 141 mg/dL — ABNORMAL HIGH (ref 65–99)
Potassium: 3.7 mmol/L (ref 3.5–5.2)
Sodium: 141 mmol/L (ref 134–144)

## 2019-08-08 LAB — POCT INR: INR: 1.6 — AB (ref 2.0–3.0)

## 2019-08-08 MED ORDER — AMLODIPINE BESYLATE 5 MG PO TABS
5.0000 mg | ORAL_TABLET | Freq: Every day | ORAL | 3 refills | Status: DC
Start: 2019-08-08 — End: 2020-08-08

## 2019-08-08 NOTE — Patient Instructions (Signed)
Description   Take 1.5 tablets today and tomorrow, then resume same dosage 1 tablet daily. Recheck INR in 2 weeks. Call with any questions or problems [phone] to [facility], Main # [phone].

## 2019-08-08 NOTE — Patient Instructions (Signed)
Medication Instructions:  1) DECREASE Amlodipine to 5mg  once daily  *If you need a refill on your cardiac medications before your next appointment, please call your pharmacy*   Lab Work: BMET today  If you have labs (blood work) drawn today and your tests are completely normal, you will receive your results only by: [name] MyChart Message (if you have MyChart) OR . A paper copy in the mail If you have any lab test that is abnormal or we need to change your treatment, we will call you to review the results.   Testing/Procedures: None   Follow-Up: At [facility], you and your health needs are our priority.  As part of our continuing mission to provide you with exceptional heart care, we have created designated Provider Care Teams.  These Care Teams include your primary Cardiologist (physician) and Advanced Practice Providers (APPs -  Physician Assistants and Nurse Practitioners) who all work together to provide you with the care you need, when you need it.  We recommend signing up for the patient portal called "MyChart".  Sign up information is provided on this After Visit Summary.  MyChart is used to connect with patients for Virtual Visits (Telemedicine).  Patients are able to view lab/test results, encounter notes, upcoming appointments, etc.  Non-urgent messages can be sent to your provider as well.   To learn more about what you can do with MyChart, go to [email or web address].    Your next appointment:   4 month(s)  The format for your next appointment:   In Person  Provider:   Truitt Merle, NP, Cecilie Kicks, NP or Kathyrn Drown, NP    Other Instructions

## 2019-08-12 DIAGNOSIS — I2510 Atherosclerotic heart disease of native coronary artery without angina pectoris: Secondary | ICD-10-CM | POA: Diagnosis not present

## 2019-08-12 DIAGNOSIS — E039 Hypothyroidism, unspecified: Secondary | ICD-10-CM | POA: Diagnosis not present

## 2019-08-12 DIAGNOSIS — M800AXD Age-related osteoporosis with current pathological fracture, other site, subsequent encounter for fracture with routine healing: Secondary | ICD-10-CM | POA: Diagnosis not present

## 2019-08-12 DIAGNOSIS — I130 Hypertensive heart and chronic kidney disease with heart failure and stage 1 through stage 4 chronic kidney disease, or unspecified chronic kidney disease: Secondary | ICD-10-CM | POA: Diagnosis not present

## 2019-08-12 DIAGNOSIS — N1831 Chronic kidney disease, stage 3a: Secondary | ICD-10-CM | POA: Diagnosis not present

## 2019-08-12 DIAGNOSIS — F0280 Dementia in other diseases classified elsewhere without behavioral disturbance: Secondary | ICD-10-CM | POA: Diagnosis not present

## 2019-08-12 DIAGNOSIS — I2781 Cor pulmonale (chronic): Secondary | ICD-10-CM | POA: Diagnosis not present

## 2019-08-12 DIAGNOSIS — I5033 Acute on chronic diastolic (congestive) heart failure: Secondary | ICD-10-CM | POA: Diagnosis not present

## 2019-08-12 DIAGNOSIS — I482 Chronic atrial fibrillation, unspecified: Secondary | ICD-10-CM | POA: Diagnosis not present

## 2019-08-19 ENCOUNTER — Encounter: Payer: Self-pay | Admitting: Pulmonary Disease

## 2019-08-19 ENCOUNTER — Other Ambulatory Visit: Payer: Self-pay

## 2019-08-19 ENCOUNTER — Ambulatory Visit (INDEPENDENT_AMBULATORY_CARE_PROVIDER_SITE_OTHER): Payer: Medicare PPO | Admitting: Pulmonary Disease

## 2019-08-19 VITALS — BP 122/74 | HR 68 | Temp 98.0°F | Ht 65.0 in | Wt 146.0 lb

## 2019-08-19 DIAGNOSIS — J449 Chronic obstructive pulmonary disease, unspecified: Secondary | ICD-10-CM

## 2019-08-19 DIAGNOSIS — J9621 Acute and chronic respiratory failure with hypoxia: Secondary | ICD-10-CM

## 2019-08-19 DIAGNOSIS — B348 Other viral infections of unspecified site: Secondary | ICD-10-CM | POA: Diagnosis not present

## 2019-08-19 DIAGNOSIS — J690 Pneumonitis due to inhalation of food and vomit: Secondary | ICD-10-CM

## 2019-08-19 DIAGNOSIS — I5032 Chronic diastolic (congestive) heart failure: Secondary | ICD-10-CM

## 2019-08-19 NOTE — Progress Notes (Signed)
@Patient  ID: Amber [name], female    DOB: 1934/06/08, 85 y.o.   MRN: [id number]  Chief Complaint  Patient presents with  . Hospitalization Follow-up    bronchitis 07/23/2019    Referring provider: Antony Contras, MD  HPI:  85 year old female former smoker followed in our office for COPD, history of streptococcal pneumonia, history of aspiration pneumonia  PMH: A. fib, chronic diastolic heart failure, dyslipidemia, fibromyalgia, depression, hypothyroidism Smoker/ Smoking History: Former smoker.  Quit 1979.  10-pack-year smoking history. Maintenance: Breo 100 Pt of: Former patient of Dr. Lake Bells  08/19/2019  - Visit   85 year old female former smoker followed in our office for COPD, suspected asthma, history of streptococcal pneumonia as well as aspiration pneumonia.  Previous patient of Dr. Lake Bells.  Presenting to office today as a hospital follow-up.  Patient was hospitalized on 07/23/2019 for bronchitis.  She was discharged on 08/04/2019.  An excerpt of that discharge summary is listed below:  Admit date: 07/23/2019 Discharge date: 08/04/2019  Admitted From: Home Disposition: Home  Recommendations for Outpatient Follow-up:  1. Follow up with PCP in 1 week 2. Follow up with Pulmonology 3. Follow up with Cardiology 4. Please obtain BMP/CBC in one week 5. Please follow up on the following pending results: None  Home Health: PT, OT Equipment/Devices: Nebulizer machine  Discharge Condition: Stable CODE STATUS: DNR Diet recommendation: Heart healthy   Brief/Interim Summary:  Admission HPI written by Christel Mormon, MD   HISTORY OF PRESENT ILLNESS: Amber [name]  is a 85 y.o. female with a known history of dementia, coronary artery disease, CVA, COPD, hypertension, hypothyroidism, chronic diastolic CHF and dyslipidemia, who presented to the emergency room with acute onset of recent worsening cough with associated dyspnea and inability to expectorate which has been  intermittent lately.  Yesterday she was having wet rattling cough and when she eats with try to clear her throat.  There was a question about aspiration recently by her daughter who is a Marine scientist with solid food and fluids.  She had a fever last night and has after taking Tylenol her temperature was 99.9 without chills.  Her daughter noticed today that the patient was hypoxemic below 90 on pulse oximetry.  The patient was prescribed doxycycline by her PCP yesterday.  No reported nausea or vomiting.  No abdominal pain or melena or bright red bleeding per rectum.  She denies any dysuria, oliguria or hematuria or flank pain.  No chest pain or palpitations.  Upon presentation to the emergency room, respiratory it was 18 and later 23 with otherwise normal vital signs.  Labs revealed unremarkable CBC and BMP showing hypokalemia of 3.4 with negative LFTs.  INR was 2.2 on Coumadin and PTT 23.7 persistent blunting of the left costodiaphragmatic angle likely small effusion and/or atelectasis. EKG showed atrial fibrillation with controlled ventricular sponsor of 71 with right axis deviation and nonspecific repolarization.  Chest CTA revealed no evidence for PE but showed 1. Significant debris within the mid trachea with fluid level, with debris extending into the left greater than right mainstem bronchi. There is short segment complete occlusion of left lower lobe bronchus with distal bronchial filling and mucoid impaction. Findings may be due to aspiration or mucous plugging. 2. Trace left pleural effusion. 3. Minimal peripheral opacity in the right upper lobe is unchanged from prior exam and suggestive of scarring. 4. Reflux of contrast into the hepatic veins and IVC consistent with elevated right heart pressures.   Hospital course:  Acute respiratory failure  with hypoxia Multifactorial in setting of heart failure, acute COPD, aspiration pneumonitis. Patient has been successfully weaned to room  air.  Acute on chronic diastolic heart failure Patient treated with Lasix IV with good diuresis and improvement of respiratory status. There was an initial attempt to transition to oral lasix which unfortunately required re initiation of Lasix IV. Symptoms are again improved. Resume home Lasix 80 mg daily. Will need outpatient cardiology follow-up.  COPD exacerbation Patient managed on steroids and nebulizer treatments with improvement of symptoms. No current wheezing. Patient seen by pulmonology who recommend an LABA/ICS inhaler and outpatient follow-up. Also recommendation for steroid taper. Patient discharged on Breo Ellipta, Duonebs with nebulizer machine and prednisone taper. Follow-up with pulmonology scheduled for July.  Aspiration pneumonitis Patient continues on a regular diet with understanding of aspiration risk. Dysphagia likely related to dementia. Chest x-ray suggests possible LLL pneumonia, and patient was treated with antibiotics including Azithromycin, Unasyn and Augmenting but felt to have pneumonitis rather than pneumonia.  Rhinovirus URI Tracheobronchitis Improved.  Chronic atrial fibrillation Continue Coumadin and metoprolol  CAD S/p CABG. Continue metoprolol and Plavix. Not on statin secondary to allergy  Gout Continue allopurinol  Dementia with depression Continue Aricept, Namenda and Celexa  Hypokalemia Secondary to diuresis. Repleted as needed. Resume home potassium supplementation. Repeat BMP as an outpatient.  Hypothyroidism Continue Synthroid  CKD stage II GFR does not support diagnosis of stage III at this time.  Patient was also seen by our clinicians when in the hospital.  Recommendations from that progress note on 08/01/2019 that she have a repeat a chest x-ray.  Follow-up with our office.  Wean prednisone taper off.  Continue Pulmicort and Brovana nebulized medications.  Transition to LABA/ICS agent based off of coverage of insurance.   Stop antibiotics.  Home physical therapy.   Patient presenting today with her daughter.  Reporting that she is doing well since getting discharge from the hospital.  They are maintained on [place].  They are unsure if they should remain on this inhaler given they do not notice significant clinical improvement.  We will discuss and review this today.   Questionaires / Pulmonary Flowsheets:   ACT:  No flowsheet data found.  MMRC: No flowsheet data found.  Epworth:  No flowsheet data found.  Tests:    PFT: 2012 spirometry ratio 66%, FEV1 1.35 L 62% predicted, FVC 2.05 L 71% predicted, DLCO 18.40 mL 75 %/day 2014 spirometry reviewed not acceptable by ATS criteria April 2019 ratio 68%, FEV1 0.98 L, changed to 1.24 L 66% predicted 26% change, total lung capacity 4.5 L 90% predicted, DLCO 13.41 mL 56% predicted   Echo 02/09/17 >> EF 60 to 65%, mild AS, mild MS, mod LA dilation, mod TR, PAS 52 mmHg  PFT 05/13/17 >> FEV1 1.24 (66%), FEV1% 71, TLC 4.48 (90%), DLCO 56%, +BD  CT angio chest 07/23/19 >> patulous esophagus, debris in trachea extending to mainstem bronchi   FENO:  No results found for: NITRICOXIDE  PFT: PFT Results Latest Ref Rng & Units 05/13/2017  FVC-Pre L 1.44  FVC-Predicted Pre % 57  FVC-Post L 1.76  FVC-Predicted Post % 69  Pre FEV1/FVC % % 68  Post FEV1/FCV % % 71  FEV1-Pre L 0.98  FEV1-Predicted Pre % 52  FEV1-Post L 1.24  DLCO UNC% % 56  DLCO COR %Predicted % 82  TLC L 4.48  TLC % Predicted % 90  RV % Predicted % 106    WALK:  SIX MIN WALK  08/06/2012  Supplimental Oxygen during Test? (L/min) No  Tech Comments: Pt did not complete last lap due to fatigue.    Imaging: DG Chest 2 View  Result Date: 07/23/2019 CLINICAL DATA:  Cough and shortness of breath EXAM: CHEST - 2 VIEW COMPARISON:  04/30/2019 FINDINGS: Post median sternotomy for CABG. Cardiomediastinal contours are stable enlarged. Persistent blunting of the LEFT costodiaphragmatic angle.  Minimal LEFT basilar atelectasis. Lungs are otherwise clear. Visualized skeletal structures to the extent evaluated are unremarkable. IMPRESSION: Persistent blunting of the LEFT costodiaphragmatic angle, likely small effusion and/or atelectasis. Electronically Signed   By: Zetta Bills M.D.   On: 07/23/2019 17:20   CT Angio Chest PE W/Cm &/Or Wo Cm  Result Date: 07/23/2019 CLINICAL DATA:  Cough, shortness of breath, fatigue and weakness for 4 days. EXAM: CT ANGIOGRAPHY CHEST WITH CONTRAST TECHNIQUE: Multidetector CT imaging of the chest was performed using the standard protocol during bolus administration of intravenous contrast. Multiplanar CT image reconstructions and MIPs were obtained to evaluate the vascular anatomy. CONTRAST:  136mL OMNIPAQUE IOHEXOL 350 MG/ML SOLN COMPARISON:  Radiograph earlier this day.  Chest CT 04/28/2019 FINDINGS: Cardiovascular: There are no filling defects within the pulmonary arteries to suggest pulmonary embolus. Thoracic aortic atherosclerosis. Post CABG with mild cardiomegaly and calcification of native coronary arteries. Contrast refluxes into the hepatic veins and IVC. Mediastinum/Nodes: Patulous esophagus. No enlarged mediastinal or hilar lymph nodes. No visualized thyroid nodules. Lungs/Pleura: Significant debris within the mid trachea with fluid level, with debris extending into the left greater than right mainstem bronchi resultant and mild luminal narrowing. There is short segment complete occlusion of left lower lobe bronchus with distal bronchial filling and mucoid impaction. Bronchial filling in the right lower lobe to a lesser extent. Trace left pleural effusion. Minimal subpleural irregular opacity in the right upper lobe is stable from prior exam and likely scarring. Upper Abdomen: Reflux of contrast into the hepatic veins and IVC. Upper abdominal atherosclerosis. Musculoskeletal: Post median sternotomy. There are multiple chronic compression fractures throughout  the thoracic spine, unchanged from prior. Healing left rib fractures. Review of the MIP images confirms the above findings. IMPRESSION: 1. No pulmonary embolus. 2. Significant debris within the mid trachea with fluid level, with debris extending into the left greater than right mainstem bronchi. There is short segment complete occlusion of left lower lobe bronchus with distal bronchial filling and mucoid impaction. Findings may be due to aspiration or mucous plugging. 3. Trace left pleural effusion. 4. Minimal peripheral opacity in the right upper lobe is unchanged from prior exam and suggestive of scarring. 5. Reflux of contrast into the hepatic veins and IVC consistent with elevated right heart pressures. Aortic Atherosclerosis (ICD10-I70.0). Electronically Signed   By: Keith Rake M.D.   On: 07/23/2019 22:51   DG CHEST PORT 1 VIEW  Result Date: 08/01/2019 CLINICAL DATA:  85 year old female with COPD. EXAM: PORTABLE CHEST 1 VIEW COMPARISON:  Chest radiograph dated 07/29/2019. FINDINGS: Small left pleural effusion with left lung base atelectasis. Pneumonia is not excluded. Clinical correlation is recommended. No pneumothorax. Mild cardiomegaly. Atherosclerotic calcification of the aorta. Median sternotomy wires and CABG vascular clips. No acute osseous pathology. IMPRESSION: Small left pleural effusion and left lung base atelectasis versus infiltrate. No significant interval change. Electronically Signed   By: Anner Crete M.D.   On: 08/01/2019 15:58   DG Chest Port 1 View  Result Date: 07/29/2019 CLINICAL DATA:  85 year old female with history of pneumonia. EXAM: PORTABLE CHEST 1 VIEW COMPARISON:  Chest  x-ray 07/28/2019. FINDINGS: Left lower lobe airspace consolidation. Small left pleural effusion. Right lung is clear. No right pleural effusion. No pneumothorax. No definite suspicious appearing pulmonary nodules or masses are noted. No evidence of pulmonary edema. Heart size is normal. Upper  mediastinal contours are within normal limits. IMPRESSION: 1. Left lower lobe pneumonia with small left parapneumonic pleural effusion, similar to the prior study. 2. Aortic atherosclerosis. Electronically Signed   By: Vinnie Langton M.D.   On: 07/29/2019 08:13   DG CHEST PORT 1 VIEW  Result Date: 07/28/2019 CLINICAL DATA:  Acute respiratory failure.  Coronary artery disease. EXAM: PORTABLE CHEST 1 VIEW COMPARISON:  07/27/2019 FINDINGS: Stable mild cardiomegaly. Prior CABG. Mild diffuse interstitial infiltrates show no significant change. Opacity in the left lung base is likely due to small left pleural effusion and atelectasis, although left lower lobe airspace disease cannot be excluded. These findings are also unchanged. IMPRESSION: 1. Stable mild cardiomegaly and mild diffuse interstitial infiltrates/edema. 2. Stable small left pleural effusion and left basilar atelectasis versus airspace disease. 3. No new or worsening disease identified. Electronically Signed   By: Marlaine Hind M.D.   On: 07/28/2019 09:15   DG CHEST PORT 1 VIEW  Result Date: 07/27/2019 CLINICAL DATA:  Shortness of breath EXAM: PORTABLE CHEST 1 VIEW COMPARISON:  July 26, 2019 FINDINGS: There is persistent airspace consolidation in the left lower lobe with small left pleural effusion. There is suspected mild layering effusion on the right. Lungs otherwise are clear. Heart is upper normal in size with pulmonary vascularity normal. No adenopathy. Patient is status post internal mammary bypass grafting. There is aortic atherosclerosis. No evident pneumothorax. No bone lesions. IMPRESSION: Persistent airspace opacity/consolidation left lower lobe with small left pleural effusion. Suspect mild layering effusion on the right. Right otherwise clear. Stable cardiac silhouette. Postoperative changes noted. Aortic Atherosclerosis (ICD10-I70.0). Electronically Signed   By: Lowella Grip III M.D.   On: 07/27/2019 08:29   DG Chest Port 1  View  Result Date: 07/26/2019 CLINICAL DATA:  Cough EXAM: PORTABLE CHEST 1 VIEW COMPARISON:  Chest CT from 2 days ago FINDINGS: Trace left pleural effusion. No consolidation, edema, or air leak. CABG. Stable heart size and mediastinal contours IMPRESSION: Stable small volume effusion and atelectasis on the left. Electronically Signed   By: Monte Fantasia M.D.   On: 07/26/2019 05:14   DG Swallowing Func-Speech Pathology  Result Date: 07/25/2019 Objective Swallowing Evaluation: Type of Study: MBS-Modified Barium Swallow Study  Patient Details Name: DONDA FRIEDLI MRN: [id number] Date of Birth: [birth year] Today's Date: 07/25/2019 Time: SLP Start Time (ACUTE ONLY): 6295 -SLP Stop Time (ACUTE ONLY): 0855 SLP Time Calculation (min) (ACUTE ONLY): 23 min Past Medical History: Past Medical History: Diagnosis Date . Abnormality of gait 08/02/2014 . Cerebrovascular disease 05/21/2016 . Cervical spinal stenosis  . Chronic atrial fibrillation (HCC)   a. chronic/rate controlled;  b. chronic coumadin. . Chronic diastolic CHF (congestive heart failure) ([place])   a. 02/2013 Echo: EF 55-60%, mild AI, mod dil LA. . CKD (chronic kidney disease), stage III  . COPD with emphysema ([place])   PFT 05/02/10>>FEV1 1.35(62%), FEV1% 66, DLCO 75% . Coronary artery disease   a. s/p CABG;  b. Abnormal nuc 2015 - managed medically. . CVA (cerebral vascular accident) ([place])   left sided weakness . Depression  . Fibromyalgia  . GERD (gastroesophageal reflux disease)   pepcid 2-3 times per week . Gout  . Hemiparesis and alteration of sensations as late effects of stroke ([place]) 01/17/2015 .  History of melanoma   squamous cell, melanoma . Hyperlipidemia   a. statin intolerant, not felt to be candidate for PCSK9 due to chronically elevated CK levels. . Hypertension  . Hypertensive heart disease  . Hypothyroidism  . Insomnia  . Memory change 01/23/2014 . Nasal polyposis  . Osteoarthritis  . Osteoporosis  . Pneumonia   1990 . Tobacco abuse  Past Surgical History:  Past Surgical History: Procedure Laterality Date . ABDOMINAL HYSTERECTOMY   . BREAST LUMPECTOMY  1980s  Benign lesion - right . carpel tunnel    right . CORONARY ARTERY BYPASS GRAFT  2000 . EYE SURGERY    bilateral cataracts . IR GENERIC HISTORICAL  03/31/2016  IR RADIOLOGIST EVAL & MGMT 03/31/2016 [facility]-INTERV RAD . RADIOLOGY WITH ANESTHESIA  12/24/2011  Procedure: RADIOLOGY WITH ANESTHESIA;  Surgeon: Medication Radiologist, MD;  Location: [facility];  Service: Radiology;  Laterality: N/A;  Extra Cranial Vascular Stent . RADIOLOGY WITH ANESTHESIA N/A 07/19/2014  Procedure: ANGIOPLASTY;  Surgeon: Luanne Bras, MD;  Location: [facility];  Service: Radiology;  Laterality: N/A; . RADIOLOGY WITH ANESTHESIA N/A 07/27/2014  Procedure: ANGIOPLASTY;  Surgeon: Luanne Bras, MD;  Location: [facility];  Service: Radiology;  Laterality: N/A; . TONSILLECTOMY   HPI: Amber [name]  is a 85 y.o. female with a known history of dementia, coronary artery disease, CVA, COPD, hypertension, hypothyroidism, chronic diastolic CHF and dyslipidemia, who presented to the emergency room with acute onset of recent worsening cough with associated dyspnea and inability to expectorate.  Chest CT concerning for aspiration PNA vs mucous plugging.  Pt completed an MBS on 02/17/2017 with recommendations for regular solids and thin liquids.  Subjective: Pt was alert Assessment / Plan / Recommendation CHL IP CLINICAL IMPRESSIONS 07/25/2019 Clinical Impression Patient presents with minimal oral dysphagia resulting in minimal delay in oral transiting with intact pharyngeal swallow.  Mild pharyngo-cervical esophageal dysphagia characterized by prominent cricopharyngeus (See Rf 8, 86/170) with appearance of widened esophagus below did not trap barium at this time.  Radiologist not present to confirm.  Fortunately nearly 100% of barium flowed through this area - including tablet, pudding and solids.  But would advise to monitor for CP dysfunction impacts closely.  Pt did not  report symptoms of dysphagia during testing. She did produce cough x2 once prior to [facility] and 2nd time during MBS- no aspiration or penetration of barium observed. SLP would advise pt to monitor however to assure tolerating well.  No aspiration or penetration of any consistency tested noted.  Pt is noted to become dyspenic and thus her swallow/respiration reciprocity may be compromised at times allowing episodic aspiration.  Due to weakness and dyspnea, recommend advance diet to dys3/thin with strict precautions. SLP Visit Diagnosis Dysphagia, pharyngoesophageal phase (R13.14);Dysphagia, oral phase (R13.11) Attention and concentration deficit following -- Frontal lobe and executive function deficit following -- Impact on safety and function Moderate aspiration risk   CHL IP TREATMENT RECOMMENDATION 07/24/2019 Treatment Recommendations Defer until completion of intrumental exam   Prognosis 07/25/2019 Prognosis for Safe Diet Advancement Fair Barriers to Reach Goals Cognitive deficits Barriers/Prognosis Comment -- CHL IP DIET RECOMMENDATION 07/25/2019 SLP Diet Recommendations Dysphagia 3 (Mech soft) solids;Thin liquid Liquid Administration via -- Medication Administration (No Data) Compensations Minimize environmental distractions;Slow rate;Small sips/bites Postural Changes Remain semi-upright after after feeds/meals (Comment);Seated upright at 90 degrees   CHL IP OTHER RECOMMENDATIONS 07/25/2019 Recommended Consults -- Oral Care Recommendations Oral care before and after PO Other Recommendations Have oral suction available   CHL IP FOLLOW UP RECOMMENDATIONS  07/25/2019 Follow up Recommendations Skilled Nursing facility;24 hour supervision/assistance   CHL IP FREQUENCY AND DURATION 07/25/2019 Speech Therapy Frequency (ACUTE ONLY) min 2x/week Treatment Duration 2 weeks      CHL IP ORAL PHASE 07/25/2019 Oral Phase WFL Oral - Pudding Teaspoon -- Oral - Pudding Cup -- Oral - Honey Teaspoon -- Oral - Honey Cup -- Oral - Nectar  Teaspoon -- Oral - Nectar Cup WFL Oral - Nectar Straw -- Oral - Thin Teaspoon WFL Oral - Thin Cup WFL;Piecemeal swallowing Oral - Thin Straw WFL Oral - Puree WFL Oral - Mech Soft Delayed oral transit;Premature spillage;WFL Oral - Regular -- Oral - Multi-Consistency -- Oral - Pill Other (Comment) Oral Phase - Comment --  CHL IP PHARYNGEAL PHASE 07/25/2019 Pharyngeal Phase Impaired Pharyngeal- Pudding Teaspoon -- Pharyngeal -- Pharyngeal- Pudding Cup -- Pharyngeal -- Pharyngeal- Honey Teaspoon -- Pharyngeal -- Pharyngeal- Honey Cup -- Pharyngeal -- Pharyngeal- Nectar Teaspoon -- Pharyngeal -- Pharyngeal- Nectar Cup WFL;Delayed swallow initiation-vallecula Pharyngeal Material does not enter airway Pharyngeal- Nectar Straw -- Pharyngeal -- Pharyngeal- Thin Teaspoon WFL Pharyngeal Material does not enter airway Pharyngeal- Thin Cup [facility] Pharyngeal Material does not enter airway Pharyngeal- Thin Straw WFL Pharyngeal Material does not enter airway Pharyngeal- Puree Delayed swallow initiation-vallecula Pharyngeal Material does not enter airway Pharyngeal- Mechanical Soft Delayed swallow initiation-vallecula Pharyngeal Material does not enter airway Pharyngeal- Regular -- Pharyngeal -- Pharyngeal- Multi-consistency -- Pharyngeal -- Pharyngeal- Pill WFL Pharyngeal Material does not enter airway Pharyngeal Comment --  CHL IP CERVICAL ESOPHAGEAL PHASE 07/25/2019 Cervical Esophageal Phase Impaired Pudding Teaspoon -- Pudding Cup -- Honey Teaspoon -- Honey Cup -- Nectar Teaspoon -- Nectar Cup Prominent cricopharyngeal segment Nectar Straw -- Thin Teaspoon Prominent cricopharyngeal segment Thin Cup Prominent cricopharyngeal segment Thin Straw Prominent cricopharyngeal segment Puree Prominent cricopharyngeal segment Mechanical Soft Prominent cricopharyngeal segment Regular -- Multi-consistency -- Pill Prominent cricopharyngeal segment Cervical Esophageal Comment Patient appears with prominent cricopharyngeus with widening of esophagus  below concerning for potential CP achalasia.  Radiologist not present to confirm.  Fortunately nearly 100% of barium flowed through this area - including tablet, pudding and solids.  But would advise to monitor for CP dysfunction impacts closely.  Pt did not report symptoms of dysphagia during testing. She did produce cough x2 once prior to [facility] and 2nd time during MBS- no aspiration or penetration of barium observed. Kathleen Lime, MS [facility] SLP Acute Rehab Services Office [phone] [name] 07/25/2019, 10:09 AM               Lab Results:  CBC    Component Value Date/Time   WBC 11.0 (H) 08/02/2019 0547   RBC 4.16 08/02/2019 0547   HGB 11.9 (L) 08/02/2019 0547   HGB 14.3 08/31/2018 1601   HCT 38.0 08/02/2019 0547   HCT 42.6 08/31/2018 1601   PLT 362 08/02/2019 0547   PLT 243 08/31/2018 1601   MCV 91.3 08/02/2019 0547   MCV 89 08/31/2018 1601   MCH 28.6 08/02/2019 0547   MCHC 31.3 08/02/2019 0547   RDW 15.1 08/02/2019 0547   RDW 14.7 08/31/2018 1601   LYMPHSABS 1.5 07/23/2019 2017   LYMPHSABS 2.6 03/10/2017 0937   MONOABS 0.9 07/23/2019 2017   EOSABS 0.2 07/23/2019 2017   EOSABS 0.0 03/10/2017 0937   BASOSABS 0.1 07/23/2019 2017   BASOSABS 0.0 03/10/2017 0937    BMET    Component Value Date/Time   NA 141 08/08/2019 1112   K 3.7 08/08/2019 1112   CL 100 08/08/2019 1112  CO2 25 08/08/2019 1112   GLUCOSE 141 (H) 08/08/2019 1112   GLUCOSE 94 08/03/2019 0746   BUN 14 08/08/2019 1112   CREATININE 1.07 (H) 08/08/2019 1112   CREATININE 0.97 (H) 11/09/2015 1326   CALCIUM 9.2 08/08/2019 1112   GFRNONAA 48 (L) 08/08/2019 1112   GFRAA 56 (L) 08/08/2019 1112    BNP    Component Value Date/Time   BNP 513.1 (H) 07/23/2019 2017    ProBNP    Component Value Date/Time   PROBNP 1,685 (H) 09/29/2017 1555   PROBNP 5,472.0 (H) 02/14/2013 0806    Specialty Problems      Pulmonary Problems   Streptococcal pneumonia (HCC)   Acute on chronic respiratory failure with hypoxia  (HCC)   Asthma-COPD overlap syndrome (HCC)   Acute pulmonary edema (HCC)   Hypoxia   Acute respiratory failure with hypoxia (HCC)   Aspiration pneumonitis (HCC)      Allergies  Allergen Reactions  . Ezetimibe Other (See Comments)    Myalgia  . Welchol [Colesevelam Hcl] Other (See Comments)    Muscle aches  . Adhesive [Tape] Itching, Rash and Other (See Comments)    Burning   . Ceclor [Cefaclor] Other (See Comments)    Unknown allergic reaction  . Elastic Bandages & [Zinc] Rash and Other (See Comments)    Turns red on the areas it touches  . Latex Itching, Rash and Other (See Comments)    Burning   . Lipitor [Atorvastatin Calcium] Other (See Comments)    Increased fibromyalgia pain  . Mevacor [Lovastatin] Other (See Comments)    Increased fibromyalgia pain  . Pravachol Other (See Comments)    Increased fibromyalgia pain  . Vasotec Other (See Comments)    Unknown allergic reaction    Immunization History  Administered Date(s) Administered  . Influenza Inj Mdck Quad Pf 11/10/2017  . Influenza Split 12/12/2010  . Influenza, High Dose Seasonal PF 10/25/2016, 11/05/2016, 11/01/2018  . PFIZER SARS-COV-2 Vaccination 02/22/2019, 03/13/2019  . Tdap 02/06/2017, 02/17/2017    Past Medical History:  Diagnosis Date  . Abnormality of gait 08/02/2014  . Cerebrovascular disease 05/21/2016  . Cervical spinal stenosis   . Chronic atrial fibrillation (HCC)    a. chronic/rate controlled;  b. chronic coumadin.  . Chronic diastolic CHF (congestive heart failure) ([place])    a. 02/2013 Echo: EF 55-60%, mild AI, mod dil LA.  . CKD (chronic kidney disease), stage III   . COPD with emphysema ([place])    PFT 05/02/10>>FEV1 1.35(62%), FEV1% 66, DLCO 75%  . Coronary artery disease    a. s/p CABG;  b. Abnormal nuc 2015 - managed medically.  . CVA (cerebral vascular accident) ([place])    left sided weakness  . Depression   . Fibromyalgia   . GERD (gastroesophageal reflux disease)    pepcid 2-3 times  per week  . Gout   . Hemiparesis and alteration of sensations as late effects of stroke ([place]) 01/17/2015  . History of melanoma    squamous cell, melanoma  . Hyperlipidemia    a. statin intolerant, not felt to be candidate for PCSK9 due to chronically elevated CK levels.  . Hypertension   . Hypertensive heart disease   . Hypothyroidism   . Insomnia   . Memory change 01/23/2014  . Nasal polyposis   . Osteoarthritis   . Osteoporosis   . Pneumonia    1990  . Tobacco abuse     Tobacco History: Social History   Tobacco Use  Smoking  Status Former Smoker  . Packs/day: 0.40  . Years: 25.00  . Pack years: 10.00  . Types: Cigarettes  . Quit date: 02/10/1977  . Years since quitting: 42.5  Smokeless Tobacco Never Used   Counseling given: Not Answered   Continue to not smoke  Outpatient Encounter Medications as of 08/19/2019  Medication Sig  . allopurinol (ZYLOPRIM) 300 MG tablet Take 300 mg by mouth at bedtime.   [name] amLODipine (NORVASC) 5 MG tablet Take 1 tablet (5 mg total) by mouth daily.  . citalopram (CELEXA) 20 MG tablet TAKE 1 TABLET BY MOUTH TWICE A DAY  . clopidogrel (PLAVIX) 75 MG tablet TAKE 1 TABLET (75 MG TOTAL) BY MOUTH DAILY.  . diphenoxylate-atropine (LOMOTIL) 2.5-0.025 MG tablet Take 1 tablet by mouth every Monday, Wednesday, and Friday.   . donepezil (ARICEPT) 5 MG tablet Take 1 tablet (5 mg total) by mouth daily.  . ergocalciferol (VITAMIN D2) 50000 UNITS capsule Take 50,000 Units by mouth 2 (two) times a week. Tuesday and Friday  . fluticasone (FLONASE) 50 MCG/ACT nasal spray Place 1 spray into both nostrils daily.   . fluticasone furoate-vilanterol (BREO ELLIPTA) 100-25 MCG/INH AEPB Inhale 1 puff into the lungs daily.  . furosemide (LASIX) 80 MG tablet Take 80 mg by mouth daily.   [name] ipratropium-albuterol (DUONEB) 0.5-2.5 (3) MG/3ML SOLN Take 3 mLs by nebulization every 4 (four) hours as needed (Use at home).  [name] levothyroxine (SYNTHROID, LEVOTHROID) 25 MCG tablet  Take 25 mcg by mouth every morning.   [name] losartan (COZAAR) 25 MG tablet TAKE 1 TABLET BY MOUTH EVERY DAY  . memantine (NAMENDA) 10 MG tablet Take 1 tablet (10 mg total) by mouth 2 (two) times daily.  . metoprolol succinate (TOPROL-XL) 25 MG 24 hr tablet TAKE 1 TABLET BY MOUTH EVERY DAY  . nitroGLYCERIN (NITROSTAT) 0.4 MG SL tablet Place 1 tablet (0.4 mg total) under the tongue every 5 (five) minutes as needed for chest pain.  . potassium chloride SA (KLOR-CON M20) 20 MEQ tablet TAKE 1 TABLET EVERY OTHER DAY, ALTERNATING WITH 2 TABLETS EVERY OTHER DAY  . PROAIR HFA 108 (90 BASE) MCG/ACT inhaler INHALE 2 PUFFS INTO LUNGS EVERY 6 HOURS AS NEEDED FOR WHEEZING  . Respiratory Therapy Supplies (FLUTTER) DEVI Use as directed.  . warfarin (COUMADIN) 5 MG tablet TAKE AS DIRECTED BY COUMADIN CLINIC  . ZINC-VITAMIN C PO Take 1 tablet by mouth every evening.   No facility-administered encounter medications on file as of 08/19/2019.     Review of Systems  Review of Systems  Constitutional: Positive for activity change (Improving since hospitalization). Negative for fatigue and fever.  HENT: Negative for sinus pressure, sinus pain and sore throat.   Respiratory: Positive for cough (Improving since hospitalization) and shortness of breath (Improving since hospitalization). Negative for wheezing.   Cardiovascular: Negative for chest pain and palpitations.  Gastrointestinal: Negative for diarrhea, nausea and vomiting.  Musculoskeletal: Negative for arthralgias.  Neurological: Negative for dizziness.  Psychiatric/Behavioral: Negative for sleep disturbance. The patient is not nervous/anxious.      Physical Exam  BP 122/74 (BP Location: Right Arm, Cuff Size: Normal)   Pulse 68   Temp 98 F (36.7 C) (Oral)   Ht 5\' 5"  (1.651 m)   Wt 146 lb (66.2 kg)   SpO2 98%   BMI 24.30 kg/m   Wt Readings from Last 5 Encounters:  08/19/19 146 lb (66.2 kg)  08/08/19 143 lb 3.2 oz (65 kg)  08/03/19 142 lb 1.6 oz  (  64.5 kg)  04/28/19 149 lb (67.6 kg)  04/19/19 149 lb 9.6 oz (67.9 kg)    BMI Readings from Last 5 Encounters:  08/19/19 24.30 kg/m  08/08/19 23.11 kg/m  08/03/19 22.94 kg/m  04/28/19 24.79 kg/m  04/19/19 24.15 kg/m     Physical Exam Vitals and nursing note reviewed.  Constitutional:      General: She is not in acute distress.    Appearance: Normal appearance. She is normal weight.     Comments: Thin frail elderly female  HENT:     Head: Normocephalic and atraumatic.     Right Ear: External ear normal.     Left Ear: External ear normal.     Nose: Rhinorrhea present. No congestion.     Mouth/Throat:     Mouth: Mucous membranes are moist.     Pharynx: Oropharynx is clear.  Eyes:     Pupils: Pupils are equal, round, and reactive to light.  Cardiovascular:     Rate and Rhythm: Normal rate and regular rhythm.     Pulses: Normal pulses.     Heart sounds: Normal heart sounds. No murmur heard.   Pulmonary:     Effort: Pulmonary effort is normal.     Breath sounds: No decreased air movement. Examination of the left-middle field reveals rhonchi. Examination of the right-lower field reveals rhonchi. Rhonchi present. No decreased breath sounds, wheezing or rales.  Musculoskeletal:     Cervical back: Normal range of motion.     Right lower leg: No edema.     Left lower leg: No edema.  Skin:    General: Skin is warm and dry.     Capillary Refill: Capillary refill takes less than 2 seconds.  Neurological:     General: No focal deficit present.     Mental Status: She is alert and oriented to person, place, and time. Mental status is at baseline.     Gait: Gait normal.  Psychiatric:        Mood and Affect: Mood normal.        Behavior: Behavior normal.        Thought Content: Thought content normal.        Judgment: Judgment normal.       Assessment & Plan:   Chronic diastolic heart failure (HCC) Received multiple rounds of IV Lasix in the hospital  Appears  euvolemic today  Plan: Continue to clinically monitor Keep follow-up with primary care  Asthma-COPD overlap syndrome [facility]) Reviewed pulmonary function testing from 2019   Plan: [place] care with Dr. Halford Chessman   Aspiration pneumonitis [facility]) Plan: Continue to follow swallowing precautions next we will continue clinically monitor We will obtain chest x-ray in 1 to 2 weeks in our office  Acute on chronic respiratory failure with hypoxia ([place]) Plan: Chest x-ray in 1 to 2 weeks  Rhinovirus infection Recovering    Return in about 2 months (around 10/20/2019), or if symptoms worsen or fail to improve, for Follow up with Dr. Halford Chessman, [place].   Lauraine Rinne, NP 08/19/2019   This appointment required 42 minutes of patient care (this includes precharting, chart review, review of results, face-to-face care, etc.).

## 2019-08-19 NOTE — Assessment & Plan Note (Signed)
Recovering  

## 2019-08-19 NOTE — Patient Instructions (Addendum)
You were seen today by Lauraine Rinne, NP  for:   1. Asthma-COPD overlap syndrome (HCC)  Breo Ellipta 100 >>> Take 1 puff daily in the morning right when you wake up >>>Rinse your mouth out after use >>>This is a daily maintenance inhaler, NOT a rescue inhaler >>>Contact our office if you are having difficulties affording or obtaining this medication >>>It is important for you to be able to take this daily and not miss any doses   I believe it is best to remain on this inhaler at this time  We will send in albuterol nebulized medications that you can pick up from the pharmacy.  You can use this every 6-8 hours as needed for shortness of breath or wheezing   2. Aspiration pneumonitis ([place])  - DG Chest 2 View; Future  We will check a chest x-ray on you in about 2 weeks  Okay to present to our office without an appointment anytime between 9 AM to 12:30 PM or 1:45 PM to 4:30 PM  3. Acute on chronic respiratory failure with hypoxia (HCC)  - DG Chest 2 View; Future  4. Chronic diastolic heart failure (HCC)  Continue to monitor weights   5. Rhinovirus infection  Appears to be improving   We recommend today:  Orders Placed This Encounter  Procedures  . DG Chest 2 View    Standing Status:   Future    Standing Expiration Date:   11/19/2019    Order Specific Question:   Reason for Exam (SYMPTOM  OR DIAGNOSIS REQUIRED)    Answer:   hosp follow up, asp pneumonia, pleural effusion    Order Specific Question:   Preferred imaging location?    Answer:   Internal    Order Specific Question:   Radiology Contrast Protocol - do NOT remove file path    Answer:   [email or web address]   Orders Placed This Encounter  Procedures  . DG Chest 2 View   No orders of the defined types were placed in this encounter.   Follow Up:    Return in about 2 months (around 10/20/2019), or if symptoms worsen or fail to improve, for Follow up with Dr. Halford Chessman, [place].   Please do your part to reduce the spread of COVID-19:      Reduce your risk of any infection  and COVID19 by using the similar precautions used for avoiding the common cold or flu:  [name] Wash your hands often with soap and warm water for at least 20 seconds.  If soap and water are not readily available, use an alcohol-based hand sanitizer with at least 60% alcohol.  . If coughing or sneezing, cover your mouth and nose by coughing or sneezing into the elbow areas of your shirt or coat, into a tissue or into your sleeve (not your hands). Langley Gauss A MASK when in public  . Avoid shaking hands with others and consider head nods or verbal greetings only. . Avoid touching your eyes, nose, or mouth with unwashed hands.  . Avoid close contact with people who are sick. . Avoid places or events with large numbers of people in one location, like concerts or sporting events. . If you have some symptoms but not all symptoms, continue to monitor at home and seek medical attention if your symptoms worsen. . If you are having a medical emergency, call 911.   ADDITIONAL HEALTHCARE OPTIONS FOR PATIENTS  [facility] / e-Visit: [email or web address]  [facility]: [facility]: [phone]                   [facility]: [phone]     It is flu season:   >>> Best ways to protect herself from the flu: Receive the yearly flu vaccine, practice good hand hygiene washing with soap and also using hand sanitizer when available, eat a nutritious meals, get adequate rest, hydrate appropriately   Please contact the office if your symptoms worsen or you have concerns that you are not improving.   Thank you for choosing [facility] for your healthcare, and for allowing [place] to partner with you on your healthcare journey. I am thankful to be able to provide care to you today.   Wyn Quaker FNP-C

## 2019-08-19 NOTE — Assessment & Plan Note (Signed)
Plan: Chest x-ray in 1 to 2 weeks

## 2019-08-19 NOTE — Assessment & Plan Note (Signed)
Received multiple rounds of IV Lasix in the hospital  Appears euvolemic today  Plan: Continue to clinically monitor Keep follow-up with primary care

## 2019-08-19 NOTE — Assessment & Plan Note (Signed)
Reviewed pulmonary function testing from 2019   Plan: [place] Establish care with Dr. Halford Chessman

## 2019-08-19 NOTE — Assessment & Plan Note (Signed)
Plan: Continue to follow swallowing precautions next we will continue clinically monitor We will obtain chest x-ray in 1 to 2 weeks in our office

## 2019-08-22 ENCOUNTER — Other Ambulatory Visit: Payer: Self-pay

## 2019-08-22 ENCOUNTER — Ambulatory Visit (INDEPENDENT_AMBULATORY_CARE_PROVIDER_SITE_OTHER): Payer: Medicare PPO

## 2019-08-22 DIAGNOSIS — Z5181 Encounter for therapeutic drug level monitoring: Secondary | ICD-10-CM | POA: Diagnosis not present

## 2019-08-22 DIAGNOSIS — I4891 Unspecified atrial fibrillation: Secondary | ICD-10-CM | POA: Diagnosis not present

## 2019-08-22 LAB — POCT INR: INR: 1.9 — AB (ref 2.0–3.0)

## 2019-08-22 NOTE — Patient Instructions (Signed)
Start taking 1 tablet daily, except for 1.5 tablets on Mondays. Recheck INR in 2 weeks. Call with any questions or problems [phone] to [facility], Main # [phone].

## 2019-08-23 NOTE — Progress Notes (Signed)
Reviewed and agree with assessment/plan.   Arine Foley, MD [facility] 08/23/2019, 8:57 AM Pager:  [phone]  

## 2019-08-24 ENCOUNTER — Telehealth: Payer: Self-pay | Admitting: Interventional Cardiology

## 2019-08-24 DIAGNOSIS — R0602 Shortness of breath: Secondary | ICD-10-CM

## 2019-08-24 NOTE — Telephone Encounter (Signed)
    [place] NP from [facility] called, she saw pt and concerned that pt might have aortic stenosis pt is symptomatic and would like to know if Dr. Tamala Julian needs pt to get echo. She would like to get a callback from a nurse to discuss

## 2019-08-24 NOTE — Telephone Encounter (Signed)
Spoke with NP and she states she hears a grade 4 systolic murmur and pt is very SOB.  Advised pt was seen on 6/28 and SOB was noted.  Advised meds were adjusted for a few days.  NP states she gets very winded after 30 feet.  She was wondering if maybe pt needed an echo?  Advised I will send to Dr. Tamala Julian for review.

## 2019-08-25 NOTE — Telephone Encounter (Signed)
It is okay to do echocardiogram. Has Moderate MR on most recent in 2018.

## 2019-08-26 NOTE — Telephone Encounter (Signed)
Order placed for echo.  Left message on daughter's [name]) VM advising her to call back to get echo scheduled.

## 2019-08-26 NOTE — Telephone Encounter (Signed)
Echo scheduled for 09/14/19

## 2019-09-01 DIAGNOSIS — J449 Chronic obstructive pulmonary disease, unspecified: Secondary | ICD-10-CM | POA: Diagnosis not present

## 2019-09-01 DIAGNOSIS — R5381 Other malaise: Secondary | ICD-10-CM | POA: Diagnosis not present

## 2019-09-01 DIAGNOSIS — I5032 Chronic diastolic (congestive) heart failure: Secondary | ICD-10-CM | POA: Diagnosis not present

## 2019-09-01 DIAGNOSIS — J690 Pneumonitis due to inhalation of food and vomit: Secondary | ICD-10-CM | POA: Diagnosis not present

## 2019-09-05 ENCOUNTER — Ambulatory Visit (INDEPENDENT_AMBULATORY_CARE_PROVIDER_SITE_OTHER): Payer: Medicare PPO | Admitting: *Deleted

## 2019-09-05 ENCOUNTER — Other Ambulatory Visit: Payer: Self-pay

## 2019-09-05 DIAGNOSIS — Z5181 Encounter for therapeutic drug level monitoring: Secondary | ICD-10-CM | POA: Diagnosis not present

## 2019-09-05 DIAGNOSIS — I4891 Unspecified atrial fibrillation: Secondary | ICD-10-CM

## 2019-09-05 LAB — POCT INR: INR: 2 (ref 2.0–3.0)

## 2019-09-05 NOTE — Patient Instructions (Signed)
Description    Continue taking 1 tablet daily, except for 1.5 tablets on Mondays. Recheck INR in 3 weeks. Call with any questions or problems [phone] to [facility], Main # [phone].

## 2019-09-14 ENCOUNTER — Other Ambulatory Visit: Payer: Self-pay

## 2019-09-14 ENCOUNTER — Ambulatory Visit (HOSPITAL_COMMUNITY): Payer: Medicare PPO | Attending: Cardiovascular Disease

## 2019-09-14 DIAGNOSIS — R0602 Shortness of breath: Secondary | ICD-10-CM

## 2019-09-14 LAB — ECHOCARDIOGRAM COMPLETE
AR max vel: 0.91 cm2
AV Area VTI: 0.84 cm2
AV Area mean vel: 0.84 cm2
AV Mean grad: 13 mmHg
AV Peak grad: 19.4 mmHg
Ao pk vel: 2.2 m/s
Area-P 1/2: 1.88 cm2
S' Lateral: 4 cm

## 2019-09-14 MED ORDER — PERFLUTREN LIPID MICROSPHERE
1.0000 mL | INTRAVENOUS | Status: AC | PRN
  Administered 2019-09-14: 2 mL via INTRAVENOUS

## 2019-09-15 ENCOUNTER — Other Ambulatory Visit: Payer: Self-pay | Admitting: Nurse Practitioner

## 2019-09-19 ENCOUNTER — Telehealth: Payer: Self-pay | Admitting: Interventional Cardiology

## 2019-09-19 NOTE — Telephone Encounter (Signed)
Patient's daughter returned call. Transferred call to [facility].

## 2019-09-27 DIAGNOSIS — L820 Inflamed seborrheic keratosis: Secondary | ICD-10-CM | POA: Diagnosis not present

## 2019-09-27 DIAGNOSIS — Z85828 Personal history of other malignant neoplasm of skin: Secondary | ICD-10-CM | POA: Diagnosis not present

## 2019-09-27 DIAGNOSIS — D485 Neoplasm of uncertain behavior of skin: Secondary | ICD-10-CM | POA: Diagnosis not present

## 2019-09-27 DIAGNOSIS — L570 Actinic keratosis: Secondary | ICD-10-CM | POA: Diagnosis not present

## 2019-09-27 DIAGNOSIS — C44622 Squamous cell carcinoma of skin of right upper limb, including shoulder: Secondary | ICD-10-CM | POA: Diagnosis not present

## 2019-09-27 DIAGNOSIS — L853 Xerosis cutis: Secondary | ICD-10-CM | POA: Diagnosis not present

## 2019-09-27 DIAGNOSIS — C44729 Squamous cell carcinoma of skin of left lower limb, including hip: Secondary | ICD-10-CM | POA: Diagnosis not present

## 2019-09-28 ENCOUNTER — Other Ambulatory Visit: Payer: Self-pay | Admitting: Interventional Cardiology

## 2019-09-29 NOTE — Progress Notes (Signed)
Cardiology Office Note:    Date:  09/30/2019   ID:  Amber [name], DOB 01/28/1935, MRN [id number]  PCP:  Antony Contras, MD  Cardiologist:  Sinclair Grooms, MD   Referring MD: Antony Contras, MD   Chief Complaint  Patient presents with  . Coronary Artery Disease  . Congestive Heart Failure    History of Present Illness:    Amber [name] is a 85 y.o. female with a hx of coronary artery disease with prior CABG in 2000, chronic atrial fibrillation, chronic diastolic heart failure, chronic kidney disease, stage III, hyperlipidemia (statin intolerant), and essential hypertensionwith hypertensive heart disease,cerebrovascular disease,and dementia.  She is hard of hearing.  She is accompanied by her daughter.  Echocardiogram performed earlier this month demonstrates decrease in LV function with no wall motion abnormality involving the anteroseptal distal anterior wall.  EF now 35 to 40%.  Please see below.  Discussed implications of this finding with the daughter and patient noting that she likely had a heart attack during one of her recent hospitalizations for respiratory distress.  Currently doing well.  She denies orthopnea.  She sleeps in a chair.  She has done this for years.  No recurrence of chest pain.  Appetite is improved.  Overall she is doing well.  Past Medical History:  Diagnosis Date  . Abnormality of gait 08/02/2014  . Cerebrovascular disease 05/21/2016  . Cervical spinal stenosis   . Chronic atrial fibrillation (HCC)    a. chronic/rate controlled;  b. chronic coumadin.  . Chronic diastolic CHF (congestive heart failure) ([place])    a. 02/2013 Echo: EF 55-60%, mild AI, mod dil LA.  . CKD (chronic kidney disease), stage III   . COPD with emphysema ([place])    PFT 05/02/10>>FEV1 1.35(62%), FEV1% 66, DLCO 75%  . Coronary artery disease    a. s/p CABG;  b. Abnormal nuc 2015 - managed medically.  . CVA (cerebral vascular accident) ([place])    left sided weakness  . Depression    . Fibromyalgia   . GERD (gastroesophageal reflux disease)    pepcid 2-3 times per week  . Gout   . Hemiparesis and alteration of sensations as late effects of stroke ([place]) 01/17/2015  . History of melanoma    squamous cell, melanoma  . Hyperlipidemia    a. statin intolerant, not felt to be candidate for PCSK9 due to chronically elevated CK levels.  . Hypertension   . Hypertensive heart disease   . Hypothyroidism   . Insomnia   . Memory change 01/23/2014  . Nasal polyposis   . Osteoarthritis   . Osteoporosis   . Pneumonia    1990  . Tobacco abuse     Past Surgical History:  Procedure Laterality Date  . ABDOMINAL HYSTERECTOMY    . BREAST LUMPECTOMY  1980s   Benign lesion - right  . carpel tunnel     right  . CORONARY ARTERY BYPASS GRAFT  2000  . EYE SURGERY     bilateral cataracts  . IR GENERIC HISTORICAL  03/31/2016   IR RADIOLOGIST EVAL & MGMT 03/31/2016 [facility]  . RADIOLOGY WITH ANESTHESIA  12/24/2011   Procedure: RADIOLOGY WITH ANESTHESIA;  Surgeon: Medication Radiologist, MD;  Location: [facility];  Service: Radiology;  Laterality: N/A;  Extra Cranial Vascular Stent  . RADIOLOGY WITH ANESTHESIA N/A 07/19/2014   Procedure: ANGIOPLASTY;  Surgeon: Luanne Bras, MD;  Location: [facility];  Service: Radiology;  Laterality: N/A;  . RADIOLOGY WITH ANESTHESIA N/A  07/27/2014   Procedure: ANGIOPLASTY;  Surgeon: Luanne Bras, MD;  Location: [facility];  Service: Radiology;  Laterality: N/A;  . TONSILLECTOMY      Current Medications: Current Meds  Medication Sig  . allopurinol (ZYLOPRIM) 300 MG tablet Take 300 mg by mouth at bedtime.   [name] amLODipine (NORVASC) 5 MG tablet Take 1 tablet (5 mg total) by mouth daily.  . citalopram (CELEXA) 20 MG tablet TAKE 1 TABLET BY MOUTH TWICE A DAY  . clopidogrel (PLAVIX) 75 MG tablet TAKE 1 TABLET (75 MG TOTAL) BY MOUTH DAILY.  . diphenoxylate-atropine (LOMOTIL) 2.5-0.025 MG tablet Take 1 tablet by mouth every Monday, Wednesday, and Friday.    . donepezil (ARICEPT) 5 MG tablet Take 1 tablet (5 mg total) by mouth daily.  . ergocalciferol (VITAMIN D2) 50000 UNITS capsule Take 50,000 Units by mouth 2 (two) times a week. Tuesday and Friday  . fluticasone (FLONASE) 50 MCG/ACT nasal spray Place 1 spray into both nostrils daily.   . fluticasone furoate-vilanterol (BREO ELLIPTA) 100-25 MCG/INH AEPB Inhale 1 puff into the lungs daily.  . furosemide (LASIX) 80 MG tablet Take 80 mg by mouth daily.   [name] ipratropium-albuterol (DUONEB) 0.5-2.5 (3) MG/3ML SOLN Take 3 mLs by nebulization every 4 (four) hours as needed (Use at home).  [name] levothyroxine (SYNTHROID, LEVOTHROID) 25 MCG tablet Take 25 mcg by mouth every morning.   [name] losartan (COZAAR) 25 MG tablet TAKE 1 TABLET BY MOUTH EVERY DAY  . memantine (NAMENDA) 10 MG tablet Take 1 tablet (10 mg total) by mouth 2 (two) times daily.  . metoprolol succinate (TOPROL-XL) 25 MG 24 hr tablet TAKE 1 TABLET BY MOUTH EVERY DAY  . nitroGLYCERIN (NITROSTAT) 0.4 MG SL tablet Place 1 tablet (0.4 mg total) under the tongue every 5 (five) minutes as needed for chest pain.  . potassium chloride SA (KLOR-CON M20) 20 MEQ tablet TAKE 1 TABLET EVERY OTHER DAY, ALTERNATING WITH 2 TABLETS EVERY OTHER DAY  . PROAIR HFA 108 (90 BASE) MCG/ACT inhaler INHALE 2 PUFFS INTO LUNGS EVERY 6 HOURS AS NEEDED FOR WHEEZING  . Respiratory Therapy Supplies (FLUTTER) DEVI Use as directed.  . warfarin (COUMADIN) 5 MG tablet TAKE AS DIRECTED BY COUMADIN CLINIC  . ZINC-VITAMIN C PO Take 1 tablet by mouth every evening.  . [DISCONTINUED] nitroGLYCERIN (NITROSTAT) 0.4 MG SL tablet Place 1 tablet (0.4 mg total) under the tongue every 5 (five) minutes as needed for chest pain.     Allergies:   Ezetimibe, Welchol [colesevelam hcl], Adhesive [tape], Ceclor [cefaclor], Elastic bandages & [zinc], Latex, Lipitor [atorvastatin calcium], Mevacor [lovastatin], Pravachol, and Vasotec   Social History   Socioeconomic History  . Marital status: Married     Spouse name: Not on file  . Number of children: 2  . Years of education: Not on file  . Highest education level: Not on file  Occupational History  . Occupation: Retired    Fish farm manager: [employer]  . Occupation: Network engineer    Comment: Retired  Tobacco Use  . Smoking status: Former Smoker    Packs/day: 0.40    Years: 25.00    Pack years: 10.00    Types: Cigarettes    Quit date: 02/10/1977    Years since quitting: 42.6  . Smokeless tobacco: Never Used  Vaping Use  . Vaping Use: Never used  Substance and Sexual Activity  . Alcohol use: No  . Drug use: No  . Sexual activity: Not on file  Other Topics Concern  .  Not on file  Social History Narrative   Patient is right handed.   Patient lives at home with husband   Patient drinks 8 oz caffeine occasionally.   Social Determinants of Health   Financial Resource Strain:   . Difficulty of Paying Living Expenses: Not on file  Food Insecurity:   . Worried About Charity fundraiser in the Last Year: Not on file  . Ran Out of Food in the Last Year: Not on file  Transportation Needs:   . Lack of Transportation (Medical): Not on file  . Lack of Transportation (Non-Medical): Not on file  Physical Activity:   . Days of Exercise per Week: Not on file  . Minutes of Exercise per Session: Not on file  Stress:   . Feeling of Stress : Not on file  Social Connections:   . Frequency of Communication with Friends and Family: Not on file  . Frequency of Social Gatherings with Friends and Family: Not on file  . Attends Religious Services: Not on file  . Active Member of Clubs or Organizations: Not on file  . Attends Archivist Meetings: Not on file  . Marital Status: Not on file     Family History: The patient's family history includes CAD in her brother and father; Diabetes type II in her brother; Stroke in her mother.  ROS:   Please see the history of present illness.    Decreased hearing.  No swelling.  No specific  medication side effects.  All other systems reviewed and are negative.  EKGs/Labs/Other Studies Reviewed:    The following studies were reviewed today: ECHOCARDIOGRAM 09/14/2019 IMPRESSIONS    1. The mid anteroseptal akinesis is new compared to the previous ECho  01/02/17.      [name] Left ventricular ejection fraction, by estimation, is 35 to 40%. The  left ventricle has moderately decreased function. The left ventricle  demonstrates regional wall motion abnormalities (see scoring  diagram/findings for description). Left  ventricular diastolic parameters were normal. There is severe akinesis of  the left ventricular, basal-mid anteroseptal wall.  2. Right ventricular systolic function is normal. The right ventricular  size is normal. There is mildly elevated pulmonary artery systolic  pressure.  3. Left atrial size was mildly dilated.  4. The mitral valve is grossly normal. Mild mitral valve regurgitation.  No evidence of mitral stenosis.  5. The aortic valve is tricuspid. Aortic valve regurgitation is trivial.  Mild to moderate aortic valve stenosis.   EKG:  EKG not repeated  Recent Labs: 07/23/2019: B Natriuretic Peptide 513.1 08/01/2019: ALT 48 08/02/2019: Hemoglobin 11.9; Magnesium 2.3; Platelets 362 08/08/2019: BUN 14; Creatinine, Ser 1.07; Potassium 3.7; Sodium 141  Recent Lipid Panel    Component Value Date/Time   CHOL 228 (H) 03/06/2017 0410   TRIG 34 03/06/2017 0410   HDL 62 03/06/2017 0410   CHOLHDL 3.7 03/06/2017 0410   VLDL 7 03/06/2017 0410   LDLCALC 159 (H) 03/06/2017 0410   LDLDIRECT 236.0 08/02/2014 1418    Physical Exam:    VS:  BP (!) 142/72   Pulse 60   Ht 5\' 5"  (1.651 m)   Wt 145 lb (65.8 kg)   SpO2 96%   BMI 24.13 kg/m     Wt Readings from Last 3 Encounters:  09/30/19 145 lb (65.8 kg)  08/19/19 146 lb (66.2 kg)  08/08/19 143 lb 3.2 oz (65 kg)     GEN: Elderly and frail-appearing. No acute distress HEENT: Normal NECK:  No  JVD. LYMPHATICS: No lymphadenopathy CARDIAC: Irregular, with otherwise RRR with 3/6 systolic murmur, but no gallop, or edema. VASCULAR:  Normal Pulses. No bruits. RESPIRATORY:  Clear to auscultation without rales, wheezing or rhonchi  ABDOMEN: Soft, non-tender, non-distended, No pulsatile mass, MUSCULOSKELETAL: No deformity  SKIN: Warm and dry NEUROLOGIC:  Alert and oriented x 3 PSYCHIATRIC:  Normal affect   ASSESSMENT:    1. Atrial fibrillation, unspecified type ([place])   2. Chronic diastolic heart failure ([place])   3. Coronary artery disease involving coronary bypass graft of native heart with angina pectoris ([place])   4. Long term current use of anticoagulant therapy   5. Essential hypertension   6. Dementia without behavioral disturbance, unspecified dementia type ([place])   7. Educated about COVID-19 virus infection    PLAN:    In order of problems listed above:  1. Atrial for with good rate control based upon clinical exam. 2. No clinical evidence of volume overload on exam. 3. Interval development of anteroapical wall motion abnormality consistent with infarction.  Likely contributing to heart failure while she was in the hospital on her last admission.  She is unable to tolerate statins.  We briefly discussed risk reduction.  She is not willing to use statins. 4. Not currently on aspirin.  She is on Plavix and Coumadin.  No bleeding complications. 5. Blood pressure control is excellent at 142/72 given her history and other problems. 6. Stable with her cognitive impairment and difficulty with memory.  She does not want to have a lot done.  She says she is ready to die whenever the Lord calls her home.  She does not want a lot of procedures performed. 7. She has been vaccinated.  She is using social medication technique to prevent recurrent infection.   Medication Adjustments/Labs and Tests Ordered: Current medicines are reviewed at length with the patient today.  Concerns regarding  medicines are outlined above.  No orders of the defined types were placed in this encounter.  Meds ordered this encounter  Medications  . nitroGLYCERIN (NITROSTAT) 0.4 MG SL tablet    Sig: Place 1 tablet (0.4 mg total) under the tongue every 5 (five) minutes as needed for chest pain.    Dispense:  25 tablet    Refill:  3    Patient Instructions  Medication Instructions:  Your physician recommends that you continue on your current medications as directed. Please refer to the Current Medication list given to you today.  *If you need a refill on your cardiac medications before your next appointment, please call your pharmacy*   Lab Work: None If you have labs (blood work) drawn today and your tests are completely normal, you will receive your results only by: [name] MyChart Message (if you have MyChart) OR . A paper copy in the mail If you have any lab test that is abnormal or we need to change your treatment, we will call you to review the results.   Testing/Procedures: None   Follow-Up: At [facility], you and your health needs are our priority.  As part of our continuing mission to provide you with exceptional heart care, we have created designated Provider Care Teams.  These Care Teams include your primary Cardiologist (physician) and Advanced Practice Providers (APPs -  Physician Assistants and Nurse Practitioners) who all work together to provide you with the care you need, when you need it.  We recommend signing up for the patient portal called "MyChart".  Sign up information is  provided on this After Visit Summary.  MyChart is used to connect with patients for Virtual Visits (Telemedicine).  Patients are able to view lab/test results, encounter notes, upcoming appointments, etc.  Non-urgent messages can be sent to your provider as well.   To learn more about what you can do with MyChart, go to [email or web address].    Your next appointment:   6 month(s)  The format for  your next appointment:   In Person  Provider:   You may see Sinclair Grooms, MD or one of the following Advanced Practice Providers on your designated Care Team:    Truitt Merle, NP  Cecilie Kicks, NP  Kathyrn Drown, NP    Other Instructions      Signed, Sinclair Grooms, MD  09/30/2019 2:47 PM    [place]

## 2019-09-30 ENCOUNTER — Ambulatory Visit: Payer: Medicare PPO | Admitting: Interventional Cardiology

## 2019-09-30 ENCOUNTER — Encounter: Payer: Self-pay | Admitting: Interventional Cardiology

## 2019-09-30 ENCOUNTER — Other Ambulatory Visit: Payer: Self-pay

## 2019-09-30 ENCOUNTER — Ambulatory Visit (INDEPENDENT_AMBULATORY_CARE_PROVIDER_SITE_OTHER): Payer: Medicare PPO | Admitting: *Deleted

## 2019-09-30 VITALS — BP 142/72 | HR 60 | Ht 65.0 in | Wt 145.0 lb

## 2019-09-30 DIAGNOSIS — Z7189 Other specified counseling: Secondary | ICD-10-CM

## 2019-09-30 DIAGNOSIS — Z7901 Long term (current) use of anticoagulants: Secondary | ICD-10-CM

## 2019-09-30 DIAGNOSIS — I4891 Unspecified atrial fibrillation: Secondary | ICD-10-CM

## 2019-09-30 DIAGNOSIS — I25709 Atherosclerosis of coronary artery bypass graft(s), unspecified, with unspecified angina pectoris: Secondary | ICD-10-CM | POA: Diagnosis not present

## 2019-09-30 DIAGNOSIS — F0390 Unspecified dementia without behavioral disturbance: Secondary | ICD-10-CM | POA: Diagnosis not present

## 2019-09-30 DIAGNOSIS — I5032 Chronic diastolic (congestive) heart failure: Secondary | ICD-10-CM | POA: Diagnosis not present

## 2019-09-30 DIAGNOSIS — I10 Essential (primary) hypertension: Secondary | ICD-10-CM

## 2019-09-30 DIAGNOSIS — Z5181 Encounter for therapeutic drug level monitoring: Secondary | ICD-10-CM | POA: Diagnosis not present

## 2019-09-30 LAB — POCT INR: INR: 2.8 (ref 2.0–3.0)

## 2019-09-30 MED ORDER — NITROGLYCERIN 0.4 MG SL SUBL: 0.4000 mg | SUBLINGUAL_TABLET | SUBLINGUAL | 3 refills | Status: AC | PRN

## 2019-09-30 NOTE — Patient Instructions (Addendum)
Description   Continue taking Warfarin 1 tablet daily except for 1.5 tablets on Mondays. Recheck INR in 4 weeks. Call with any questions or problems [phone] to Coumadin Clinic, Main # [phone].

## 2019-09-30 NOTE — Patient Instructions (Signed)

## 2019-10-14 ENCOUNTER — Telehealth: Payer: Self-pay | Admitting: Pulmonary Disease

## 2019-10-14 NOTE — Telephone Encounter (Signed)
Dr. Halford Chessman, please advise if you are okay with pt's visit with you on 9/7 being a televisit at pt's request.

## 2019-10-17 ENCOUNTER — Other Ambulatory Visit: Payer: Self-pay | Admitting: Interventional Cardiology

## 2019-10-18 ENCOUNTER — Ambulatory Visit (INDEPENDENT_AMBULATORY_CARE_PROVIDER_SITE_OTHER): Payer: Medicare PPO | Admitting: Pulmonary Disease

## 2019-10-18 ENCOUNTER — Encounter: Payer: Self-pay | Admitting: Pulmonary Disease

## 2019-10-18 DIAGNOSIS — J449 Chronic obstructive pulmonary disease, unspecified: Secondary | ICD-10-CM | POA: Diagnosis not present

## 2019-10-18 NOTE — Telephone Encounter (Signed)
Okay to change to televisit.  

## 2019-10-18 NOTE — Progress Notes (Signed)
[facility]  Chief Complaint  Patient presents with  . Consult    shortness of breath with exertion    Constitutional:  There were no vitals taken for this visit.  Deferred.  Past Medical History:  Pneumococcal PNA, Aspiration PNA, A fib, Systolic/Diastolic CHF, HLD, Fibromyalgia, Depression, Hypothyroidism, Dementia, CAD  Past Surgical History:  Her  has a past surgical history that includes Abdominal hysterectomy; Breast lumpectomy (1980s); carpel tunnel; Tonsillectomy; Eye surgery; Coronary artery bypass graft (2000); Radiology with anesthesia (12/24/2011); Radiology with anesthesia (N/A, 07/19/2014); Radiology with anesthesia (N/A, 07/27/2014); and ir generic historical (03/31/2016).  Brief Summary:  Amber [name] is a 85 y.o. female with former smoker with COPD.      Subjective:  Virtual Visit via Telephone Note  I connected with Amber [name] on 10/18/19 at  2:45 PM EDT by telephone and verified that I am speaking with the correct person using two identifiers.  Location: Patient: home Provider: medical office   I discussed the limitations, risks, security and privacy concerns of performing an evaluation and management service by telephone and the availability of in person appointments. I also discussed with the patient that there may be a patient responsible charge related to this service. The patient expressed understanding and agreed to proceed.  She was in hospital in June for rhinovirus and aspiration pneumonia.  Breathing better.  Not using breo or albuterol.  Doesn't need oxygen at home.  Has f/u with cardiology.  Found to have low EF and aortic stenosis.  Primary care has recommended home hospice and family is considering this.   Physical Exam:   Deferred.  Pulmonary testing:   PFT 05/13/17 >> FEV1 1.24 (66%), FEV1% 71, TLC 4.48 (90%), DLCO 56%, +BD  Chest Imaging:   CT angio chest 07/23/19 >> post CABG,  debris in trachea  Cardiac Tests:   Echo 09/14/19 >> EF 35 to 40%, mild MR, mild/mod AS  Social History:  She  reports that she quit smoking about 42 years ago. Her smoking use included cigarettes. She has a 10.00 pack-year smoking history. She has never used smokeless tobacco. She reports that she does not drink alcohol and does not use drugs.  Family History:  Her family history includes CAD in her brother and father; Diabetes type II in her brother; Stroke in her mother.     Assessment/Plan:   COPD. - no longer needing inhaler medication - she is being assessed for home hospice - she will call if she needs pulmonary follow up   Time Spent Involved in Patient Care on Day of Examination:   I discussed the assessment and treatment plan with the patient. The patient was provided an opportunity to ask questions and all were answered. The patient agreed with the plan and demonstrated an understanding of the instructions.   The patient was advised to call back or seek an in-person evaluation if the symptoms worsen or if the condition fails to improve as anticipated.  I provided 16 minutes of non-face-to-face time during this encounter.  Follow up:  Patient Instructions  Follow up as needed   Medication List:   Allergies as of 10/18/2019      Reactions   Ezetimibe Other (See Comments)   Myalgia   Welchol [colesevelam Hcl] Other (See Comments)   Muscle aches   Adhesive [tape] Itching, Rash, Other (See Comments)   Burning    Ceclor [cefaclor] Other (See Comments)   Unknown allergic reaction  Elastic Bandages & [zinc] Rash, Other (See Comments)   Turns red on the areas it touches   Latex Itching, Rash, Other (See Comments)   Burning    Lipitor [atorvastatin Calcium] Other (See Comments)   Increased fibromyalgia pain   Mevacor [lovastatin] Other (See Comments)   Increased fibromyalgia pain   Pravachol Other (See Comments)   Increased fibromyalgia pain   Vasotec Other  (See Comments)   Unknown allergic reaction      Medication List       Accurate as of October 18, 2019  3:21 PM. If you have any questions, ask your nurse or doctor.        STOP taking these medications   fluticasone furoate-vilanterol 100-25 MCG/INH Aepb Commonly known as: BREO ELLIPTA Stopped by: Chesley Mires, MD   Flutter Devi Stopped by: Chesley Mires, MD   ProAir HFA [phone] Base) MCG/ACT inhaler Generic drug: albuterol Stopped by: Chesley Mires, MD     TAKE these medications   allopurinol 300 MG tablet Commonly known as: ZYLOPRIM Take 300 mg by mouth at bedtime.   amLODipine 5 MG tablet Commonly known as: NORVASC Take 1 tablet (5 mg total) by mouth daily.   citalopram 20 MG tablet Commonly known as: CELEXA TAKE 1 TABLET BY MOUTH TWICE A DAY   clopidogrel 75 MG tablet Commonly known as: PLAVIX TAKE 1 TABLET (75 MG TOTAL) BY MOUTH DAILY.   diphenoxylate-atropine 2.5-0.025 MG tablet Commonly known as: LOMOTIL Take 1 tablet by mouth every Monday, Wednesday, and Friday.   donepezil 5 MG tablet Commonly known as: ARICEPT Take 1 tablet (5 mg total) by mouth daily.   ergocalciferol 1.25 MG (50000 UT) capsule Commonly known as: VITAMIN D2 Take 50,000 Units by mouth 2 (two) times a week. Tuesday and Friday   fluticasone 50 MCG/ACT nasal spray Commonly known as: FLONASE Place 1 spray into both nostrils daily.   furosemide 80 MG tablet Commonly known as: LASIX Take 80 mg by mouth daily.   ipratropium-albuterol 0.5-2.5 (3) MG/3ML Soln Commonly known as: DUONEB Take 3 mLs by nebulization every 4 (four) hours as needed (Use at home).   levothyroxine 25 MCG tablet Commonly known as: SYNTHROID Take 25 mcg by mouth every morning.   losartan 25 MG tablet Commonly known as: COZAAR TAKE 1 TABLET BY MOUTH EVERY DAY   memantine 10 MG tablet Commonly known as: Namenda Take 1 tablet (10 mg total) by mouth 2 (two) times daily.   metoprolol succinate 25 MG 24 hr  tablet Commonly known as: TOPROL-XL TAKE 1 TABLET BY MOUTH EVERY DAY   nitroGLYCERIN 0.4 MG SL tablet Commonly known as: NITROSTAT Place 1 tablet (0.4 mg total) under the tongue every 5 (five) minutes as needed for chest pain.   potassium chloride SA 20 MEQ tablet Commonly known as: [place]-Con M20 TAKE 1 TABLET EVERY OTHER DAY, ALTERNATING WITH 2 TABLETS EVERY OTHER DAY   warfarin 5 MG tablet Commonly known as: COUMADIN Take as directed by the anticoagulation clinic. If you are unsure how to take this medication, talk to your nurse or doctor. Original instructions: TAKE AS DIRECTED BY COUMADIN CLINIC   ZINC-VITAMIN C PO Take 1 tablet by mouth every evening.       Signature:  Chesley Mires, MD [place] Pager - [phone] 10/18/2019, 3:21 PM

## 2019-10-18 NOTE — Telephone Encounter (Signed)
Called and spoke with patient's daughter to let her know that appointment has been switched to televisit. Nothing further needed at this time.

## 2019-10-18 NOTE — Patient Instructions (Signed)
Follow up as needed

## 2019-10-25 ENCOUNTER — Other Ambulatory Visit: Payer: Self-pay

## 2019-10-25 ENCOUNTER — Ambulatory Visit (INDEPENDENT_AMBULATORY_CARE_PROVIDER_SITE_OTHER): Payer: Medicare PPO | Admitting: *Deleted

## 2019-10-25 DIAGNOSIS — Z5181 Encounter for therapeutic drug level monitoring: Secondary | ICD-10-CM

## 2019-10-25 DIAGNOSIS — I4891 Unspecified atrial fibrillation: Secondary | ICD-10-CM

## 2019-10-25 LAB — POCT INR: INR: 2.6 (ref 2.0–3.0)

## 2019-10-25 NOTE — Patient Instructions (Signed)
Description   Continue taking Warfarin 1 tablet daily except for 1.5 tablets on Mondays. Recheck INR in 5 weeks. Call with any questions or problems [phone] to Coumadin Clinic, Main # [phone].

## 2019-11-10 ENCOUNTER — Telehealth: Payer: Self-pay | Admitting: Interventional Cardiology

## 2019-11-10 ENCOUNTER — Other Ambulatory Visit: Payer: Self-pay | Admitting: Interventional Cardiology

## 2019-11-10 MED ORDER — FUROSEMIDE 80 MG PO TABS: 80.0000 mg | ORAL_TABLET | Freq: Every day | ORAL | 3 refills | Status: DC

## 2019-11-10 NOTE — Telephone Encounter (Signed)
Left message to call back.  Just need to clarify how pt is taking Furosemide.

## 2019-11-10 NOTE — Telephone Encounter (Signed)
See phone note from 9/30

## 2019-11-10 NOTE — Telephone Encounter (Signed)
Spoke with daughter, [name], and clarified how pt is taking Furosemide.  Pt takes 80mg  once daily.  Advised I will send in prescription.

## 2019-11-10 NOTE — Telephone Encounter (Signed)
Pt's daughter left message asking for a refill for furosemide. The pharmacy is requesting 40 mg tablets, but pt has 80 mg listed on her medication list. Daughter states that pt is almost out of medication. Daughter would like a call back concerning this matter. Please address

## 2019-11-10 NOTE — Telephone Encounter (Signed)
Patient's daughter is calling to talk with Dr. Tamala Julian or nurse

## 2019-11-22 ENCOUNTER — Other Ambulatory Visit: Payer: Self-pay | Admitting: Interventional Cardiology

## 2019-11-30 ENCOUNTER — Other Ambulatory Visit: Payer: Self-pay

## 2019-11-30 ENCOUNTER — Ambulatory Visit (INDEPENDENT_AMBULATORY_CARE_PROVIDER_SITE_OTHER): Payer: Medicare PPO | Admitting: Pharmacist

## 2019-11-30 DIAGNOSIS — Z5181 Encounter for therapeutic drug level monitoring: Secondary | ICD-10-CM | POA: Diagnosis not present

## 2019-11-30 DIAGNOSIS — I4891 Unspecified atrial fibrillation: Secondary | ICD-10-CM | POA: Diagnosis not present

## 2019-11-30 LAB — POCT INR: INR: 2.4 (ref 2.0–3.0)

## 2019-11-30 NOTE — Patient Instructions (Signed)
Description   Continue taking Warfarin 1 tablet daily except for 1.5 tablets on Mondays. Recheck INR in 6 weeks. Call with any questions or problems [phone] to Coumadin Clinic, Main # [phone].

## 2019-12-06 ENCOUNTER — Ambulatory Visit: Payer: Medicare PPO | Admitting: Nurse Practitioner

## 2019-12-15 DIAGNOSIS — D485 Neoplasm of uncertain behavior of skin: Secondary | ICD-10-CM | POA: Diagnosis not present

## 2019-12-15 DIAGNOSIS — L570 Actinic keratosis: Secondary | ICD-10-CM | POA: Diagnosis not present

## 2019-12-15 DIAGNOSIS — C44729 Squamous cell carcinoma of skin of left lower limb, including hip: Secondary | ICD-10-CM | POA: Diagnosis not present

## 2019-12-15 DIAGNOSIS — Z85828 Personal history of other malignant neoplasm of skin: Secondary | ICD-10-CM | POA: Diagnosis not present

## 2019-12-31 ENCOUNTER — Other Ambulatory Visit: Payer: Self-pay | Admitting: Interventional Cardiology

## 2020-01-11 ENCOUNTER — Other Ambulatory Visit: Payer: Self-pay

## 2020-01-11 ENCOUNTER — Ambulatory Visit (INDEPENDENT_AMBULATORY_CARE_PROVIDER_SITE_OTHER): Payer: Medicare PPO | Admitting: *Deleted

## 2020-01-11 DIAGNOSIS — Z5181 Encounter for therapeutic drug level monitoring: Secondary | ICD-10-CM

## 2020-01-11 DIAGNOSIS — I4891 Unspecified atrial fibrillation: Secondary | ICD-10-CM | POA: Diagnosis not present

## 2020-01-11 LAB — POCT INR: INR: 2.2 (ref 2.0–3.0)

## 2020-01-11 NOTE — Patient Instructions (Signed)
Description   Continue taking Warfarin 1 tablet daily except for 1.5 tablets on Mondays. Recheck INR in 6 weeks. Call with any questions or problems [phone] to Coumadin Clinic, Main # [phone].

## 2020-01-17 DIAGNOSIS — C44722 Squamous cell carcinoma of skin of right lower limb, including hip: Secondary | ICD-10-CM | POA: Diagnosis not present

## 2020-01-17 DIAGNOSIS — Z85828 Personal history of other malignant neoplasm of skin: Secondary | ICD-10-CM | POA: Diagnosis not present

## 2020-01-24 DIAGNOSIS — L0889 Other specified local infections of the skin and subcutaneous tissue: Secondary | ICD-10-CM | POA: Diagnosis not present

## 2020-02-29 DIAGNOSIS — L570 Actinic keratosis: Secondary | ICD-10-CM | POA: Diagnosis not present

## 2020-02-29 DIAGNOSIS — C44722 Squamous cell carcinoma of skin of right lower limb, including hip: Secondary | ICD-10-CM | POA: Diagnosis not present

## 2020-03-07 ENCOUNTER — Ambulatory Visit (INDEPENDENT_AMBULATORY_CARE_PROVIDER_SITE_OTHER): Payer: Medicare PPO | Admitting: Pharmacist

## 2020-03-07 ENCOUNTER — Other Ambulatory Visit: Payer: Self-pay

## 2020-03-07 DIAGNOSIS — Z5181 Encounter for therapeutic drug level monitoring: Secondary | ICD-10-CM

## 2020-03-07 DIAGNOSIS — I4891 Unspecified atrial fibrillation: Secondary | ICD-10-CM | POA: Diagnosis not present

## 2020-03-07 LAB — POCT INR: INR: 2.2 (ref 2.0–3.0)

## 2020-03-07 NOTE — Patient Instructions (Addendum)
Description   Continue taking Warfarin 1 tablet daily except for 1.5 tablets on Mondays. Recheck INR in 6 weeks. Call with any questions or problems [phone] to Coumadin Clinic, Main # [phone].      

## 2020-03-15 DIAGNOSIS — I69354 Hemiplegia and hemiparesis following cerebral infarction affecting left non-dominant side: Secondary | ICD-10-CM | POA: Diagnosis not present

## 2020-03-15 DIAGNOSIS — I482 Chronic atrial fibrillation, unspecified: Secondary | ICD-10-CM | POA: Diagnosis not present

## 2020-03-15 DIAGNOSIS — F0280 Dementia in other diseases classified elsewhere without behavioral disturbance: Secondary | ICD-10-CM | POA: Diagnosis not present

## 2020-03-15 DIAGNOSIS — I2510 Atherosclerotic heart disease of native coronary artery without angina pectoris: Secondary | ICD-10-CM | POA: Diagnosis not present

## 2020-03-15 DIAGNOSIS — J449 Chronic obstructive pulmonary disease, unspecified: Secondary | ICD-10-CM | POA: Diagnosis not present

## 2020-03-15 DIAGNOSIS — I5032 Chronic diastolic (congestive) heart failure: Secondary | ICD-10-CM | POA: Diagnosis not present

## 2020-03-15 DIAGNOSIS — D6869 Other thrombophilia: Secondary | ICD-10-CM | POA: Diagnosis not present

## 2020-03-15 DIAGNOSIS — N1831 Chronic kidney disease, stage 3a: Secondary | ICD-10-CM | POA: Diagnosis not present

## 2020-03-15 DIAGNOSIS — I130 Hypertensive heart and chronic kidney disease with heart failure and stage 1 through stage 4 chronic kidney disease, or unspecified chronic kidney disease: Secondary | ICD-10-CM | POA: Diagnosis not present

## 2020-03-28 DIAGNOSIS — C44722 Squamous cell carcinoma of skin of right lower limb, including hip: Secondary | ICD-10-CM | POA: Diagnosis not present

## 2020-03-28 DIAGNOSIS — C44729 Squamous cell carcinoma of skin of left lower limb, including hip: Secondary | ICD-10-CM | POA: Diagnosis not present

## 2020-03-28 DIAGNOSIS — Z85828 Personal history of other malignant neoplasm of skin: Secondary | ICD-10-CM | POA: Diagnosis not present

## 2020-04-06 ENCOUNTER — Ambulatory Visit: Payer: Medicare PPO | Admitting: Interventional Cardiology

## 2020-04-06 ENCOUNTER — Encounter: Payer: Self-pay | Admitting: Interventional Cardiology

## 2020-04-06 ENCOUNTER — Ambulatory Visit (INDEPENDENT_AMBULATORY_CARE_PROVIDER_SITE_OTHER): Payer: Medicare PPO | Admitting: *Deleted

## 2020-04-06 ENCOUNTER — Other Ambulatory Visit: Payer: Self-pay

## 2020-04-06 VITALS — BP 138/82 | HR 60 | Ht 65.0 in | Wt 138.4 lb

## 2020-04-06 DIAGNOSIS — I5042 Chronic combined systolic (congestive) and diastolic (congestive) heart failure: Secondary | ICD-10-CM

## 2020-04-06 DIAGNOSIS — I25709 Atherosclerosis of coronary artery bypass graft(s), unspecified, with unspecified angina pectoris: Secondary | ICD-10-CM

## 2020-04-06 DIAGNOSIS — Z5181 Encounter for therapeutic drug level monitoring: Secondary | ICD-10-CM | POA: Diagnosis not present

## 2020-04-06 DIAGNOSIS — Z7189 Other specified counseling: Secondary | ICD-10-CM | POA: Diagnosis not present

## 2020-04-06 DIAGNOSIS — I350 Nonrheumatic aortic (valve) stenosis: Secondary | ICD-10-CM

## 2020-04-06 DIAGNOSIS — I10 Essential (primary) hypertension: Secondary | ICD-10-CM

## 2020-04-06 DIAGNOSIS — I4891 Unspecified atrial fibrillation: Secondary | ICD-10-CM

## 2020-04-06 DIAGNOSIS — Z7901 Long term (current) use of anticoagulants: Secondary | ICD-10-CM | POA: Diagnosis not present

## 2020-04-06 LAB — POCT INR: INR: 1.7 — AB (ref 2.0–3.0)

## 2020-04-06 NOTE — Progress Notes (Addendum)
Cardiology Office Note:    Date:  04/06/2020   ID:  Amber [name], DOB 1934-09-19, MRN [id number]  PCP:  Antony Contras, MD  Cardiologist:  Sinclair Grooms, MD   Referring MD: Antony Contras, MD   Chief Complaint  Patient presents with  . Congestive Heart Failure  . Atrial Fibrillation  . Coronary Artery Disease    History of Present Illness:    Amber [name] is a 86 y.o. female with a hx of  coronary artery disease with prior CABG in 2000, chronic atrial fibrillation, chronic diastolic heart failure, chronic kidney disease, stage III, hyperlipidemia (statin intolerant), and essential hypertensionwith hypertensive heart disease,cerebrovascular disease,and dementia.  Accompanied by her daughter who does most of the talking.  Patient states she is stable since last being seen.  Daughter feels that she is doing well and does not want to rock the boat with any changes.  Patient sleeps in a chair.  She has dyspnea on exertion.  She denies angina.  She is not having lower extremity edema.  No bleeding on the current medication regimen.  Past Medical History:  Diagnosis Date  . Abnormality of gait 08/02/2014  . Cerebrovascular disease 05/21/2016  . Cervical spinal stenosis   . Chronic atrial fibrillation (HCC)    a. chronic/rate controlled;  b. chronic coumadin.  . Chronic diastolic CHF (congestive heart failure) ([place])    a. 02/2013 Echo: EF 55-60%, mild AI, mod dil LA.  . CKD (chronic kidney disease), stage III ([place])   . COPD with emphysema ([place])    PFT 05/02/10>>FEV1 1.35(62%), FEV1% 66, DLCO 75%  . Coronary artery disease    a. s/p CABG;  b. Abnormal nuc 2015 - managed medically.  . CVA (cerebral vascular accident) ([place])    left sided weakness  . Depression   . Fibromyalgia   . GERD (gastroesophageal reflux disease)    pepcid 2-3 times per week  . Gout   . Hemiparesis and alteration of sensations as late effects of stroke ([place]) 01/17/2015  . History of melanoma     squamous cell, melanoma  . Hyperlipidemia    a. statin intolerant, not felt to be candidate for PCSK9 due to chronically elevated CK levels.  . Hypertension   . Hypertensive heart disease   . Hypothyroidism   . Insomnia   . Memory change 01/23/2014  . Nasal polyposis   . Osteoarthritis   . Osteoporosis   . Pneumonia    1990  . Tobacco abuse     Past Surgical History:  Procedure Laterality Date  . ABDOMINAL HYSTERECTOMY    . BREAST LUMPECTOMY  1980s   Benign lesion - right  . carpel tunnel     right  . CORONARY ARTERY BYPASS GRAFT  2000  . EYE SURGERY     bilateral cataracts  . IR GENERIC HISTORICAL  03/31/2016   IR RADIOLOGIST EVAL & MGMT 03/31/2016 [facility]  . RADIOLOGY WITH ANESTHESIA  12/24/2011   Procedure: RADIOLOGY WITH ANESTHESIA;  Surgeon: Medication Radiologist, MD;  Location: [place];  Service: Radiology;  Laterality: N/A;  Extra Cranial Vascular Stent  . RADIOLOGY WITH ANESTHESIA N/A 07/19/2014   Procedure: ANGIOPLASTY;  Surgeon: Luanne Bras, MD;  Location: [place];  Service: Radiology;  Laterality: N/A;  . RADIOLOGY WITH ANESTHESIA N/A 07/27/2014   Procedure: ANGIOPLASTY;  Surgeon: Luanne Bras, MD;  Location: [place];  Service: Radiology;  Laterality: N/A;  . TONSILLECTOMY      Current Medications: Current Meds  Medication Sig  . allopurinol (ZYLOPRIM) 300 MG tablet Take 300 mg by mouth at bedtime.   [name] amLODipine (NORVASC) 5 MG tablet Take 1 tablet (5 mg total) by mouth daily.  . citalopram (CELEXA) 20 MG tablet TAKE 1 TABLET BY MOUTH TWICE A DAY  . clopidogrel (PLAVIX) 75 MG tablet TAKE 1 TABLET (75 MG TOTAL) BY MOUTH DAILY.  . diphenoxylate-atropine (LOMOTIL) 2.5-0.025 MG tablet Take 1 tablet by mouth every Monday, Wednesday, and Friday.  . donepezil (ARICEPT) 5 MG tablet Take 1 tablet (5 mg total) by mouth daily.  . ergocalciferol (VITAMIN D2) 50000 UNITS capsule Take 50,000 Units by mouth 2 (two) times a week. Tuesday and Friday  . fluticasone  (FLONASE) 50 MCG/ACT nasal spray Place 1 spray into both nostrils daily.   . furosemide (LASIX) 80 MG tablet Take 1 tablet (80 mg total) by mouth daily.  [name] ipratropium-albuterol (DUONEB) 0.5-2.5 (3) MG/3ML SOLN Take 3 mLs by nebulization every 4 (four) hours as needed (Use at home).  [name] levothyroxine (SYNTHROID, LEVOTHROID) 25 MCG tablet Take 25 mcg by mouth every morning.   [name] losartan (COZAAR) 25 MG tablet TAKE 1 TABLET BY MOUTH EVERY DAY  . memantine (NAMENDA) 10 MG tablet Take 1 tablet (10 mg total) by mouth 2 (two) times daily.  . metoprolol succinate (TOPROL-XL) 25 MG 24 hr tablet TAKE 1 TABLET BY MOUTH EVERY DAY  . nitroGLYCERIN (NITROSTAT) 0.4 MG SL tablet Place 1 tablet (0.4 mg total) under the tongue every 5 (five) minutes as needed for chest pain.  . potassium chloride SA (KLOR-CON M20) 20 MEQ tablet TAKE 1 TABLET EVERY OTHER DAY, ALTERNATING WITH 2 TABLETS EVERY OTHER DAY  . warfarin (COUMADIN) 5 MG tablet TAKE AS DIRECTED BY COUMADIN CLINIC  . ZINC-VITAMIN C PO Take 1 tablet by mouth every evening.     Allergies:   Ezetimibe, Welchol [colesevelam hcl], Adhesive [tape], Ceclor [cefaclor], Elastic bandages & [zinc], Latex, Lipitor [atorvastatin calcium], Mevacor [lovastatin], Pravachol, and Vasotec   Social History   Socioeconomic History  . Marital status: Married    Spouse name: Not on file  . Number of children: 2  . Years of education: Not on file  . Highest education level: Not on file  Occupational History  . Occupation: Retired    Fish farm manager: [employer]  . Occupation: Network engineer    Comment: Retired  Tobacco Use  . Smoking status: Former Smoker    Packs/day: 0.40    Years: 25.00    Pack years: 10.00    Types: Cigarettes    Quit date: 02/10/1977    Years since quitting: 43.1  . Smokeless tobacco: Never Used  Vaping Use  . Vaping Use: Never used  Substance and Sexual Activity  . Alcohol use: No  . Drug use: No  . Sexual activity: Not on file  Other Topics  Concern  . Not on file  Social History Narrative   Patient is right handed.   Patient lives at home with husband   Patient drinks 8 oz caffeine occasionally.   Social Determinants of Health   Financial Resource Strain: Not on file  Food Insecurity: Not on file  Transportation Needs: Not on file  Physical Activity: Not on file  Stress: Not on file  Social Connections: Not on file     Family History: The patient's family history includes CAD in her brother and father; Diabetes type II in her brother; Stroke in her mother.  ROS:   Please see the  history of present illness.    During.  Appetite is good.  Daughter states that the patient is DO NOT RESUSCITATE and wants to have a natural death.  The daughter asked that I "not rock the boat" in terms of medication changes.  All other systems reviewed and are negative.  EKGs/Labs/Other Studies Reviewed:    The following studies were reviewed today:  2D Doppler echocardiogram August 2021: IMPRESSIONS  1. The mid anteroseptal akinesis is new compared to the previous ECho  01/02/17.      [name] Left ventricular ejection fraction, by estimation, is 35 to 40%. The  left ventricle has moderately decreased function. The left ventricle  demonstrates regional wall motion abnormalities (see scoring  diagram/findings for description). Left  ventricular diastolic parameters were normal. There is severe akinesis of  the left ventricular, basal-mid anteroseptal wall.  2. Right ventricular systolic function is normal. The right ventricular  size is normal. There is mildly elevated pulmonary artery systolic  pressure.  3. Left atrial size was mildly dilated.  4. The mitral valve is grossly normal. Mild mitral valve regurgitation.  No evidence of mitral stenosis.  5. The aortic valve is tricuspid. Aortic valve regurgitation is trivial.  Mild to moderate aortic valve stenosis.     EKG:  EKG no new data  Recent Labs: 07/23/2019: B  Natriuretic Peptide 513.1 08/01/2019: ALT 48 08/02/2019: Hemoglobin 11.9; Magnesium 2.3; Platelets 362 08/08/2019: BUN 14; Creatinine, Ser 1.07; Potassium 3.7; Sodium 141  Recent Lipid Panel    Component Value Date/Time   CHOL 228 (H) 03/06/2017 0410   TRIG 34 03/06/2017 0410   HDL 62 03/06/2017 0410   CHOLHDL 3.7 03/06/2017 0410   VLDL 7 03/06/2017 0410   LDLCALC 159 (H) 03/06/2017 0410   LDLDIRECT 236.0 08/02/2014 1418    Physical Exam:    VS:  BP 138/82   Pulse 60   Ht 5\' 5"  (1.651 m)   Wt 138 lb 6.4 oz (62.8 kg)   SpO2 96%   BMI 23.03 kg/m     Wt Readings from Last 3 Encounters:  04/06/20 138 lb 6.4 oz (62.8 kg)  09/30/19 145 lb (65.8 kg)  08/19/19 146 lb (66.2 kg)     GEN: Elderly and frail. No acute distress HEENT: Normal NECK: No JVD. LYMPHATICS: No lymphadenopathy CARDIAC: 3/6 systolic murmur. RRR no gallop, or edema. VASCULAR:  Normal Pulses. No bruits. RESPIRATORY:  Clear to auscultation without rales, wheezing or rhonchi  ABDOMEN: Soft, non-tender, non-distended, No pulsatile mass, MUSCULOSKELETAL: No deformity  SKIN: Warm and dry NEUROLOGIC:  Alert and oriented x 3 PSYCHIATRIC:  Normal affect   ASSESSMENT:    1. Coronary artery disease involving coronary bypass graft of native heart with angina pectoris ([place])   2. Atrial fibrillation, unspecified type ([place])   3. Nonrheumatic aortic valve stenosis   4. Chronic combined systolic and diastolic heart failure ([place])   5. Long term current use of anticoagulant therapy   6. Essential hypertension   7. Educated about COVID-19 virus infection    PLAN:    In order of problems listed above:  1. Secondary prevention reviewed but not well received.  Unable to use statins and not interested in treating.  At her age I do not disagree. 2. Continue on anticoagulation therapy.  Currently on Plavix 75 mg/day and Coumadin.. 3. Mild to moderate aortic stenosis.  It is clear from today's conversation that interventional  therapy for aortic stenosis if it becomes severe will not be well  accepted by the patient, which is a shared decision and appropriate at her age, especially with her other comorbidities. 4. Discussed systolic heart failure management strategy including the rationale for the 4 pillar approach.  I have been encouraged not to make any changes by the daughter who feels she is doing too well to start potentially causing problems.  Basic metabolic panel will be done today. 5. Continue Coumadin therapy. 6. Blood pressures well controlled 7. Did not discuss Covid prevention.  Prolonged visit related to multiple questions by daughter and counseling concerning multiple significant problems including heart failure, antiplatelet therapy in combination with Coumadin with increased bleeding risk, DO NOT RESUSCITATE measures, and conservative medical management.  Medication Adjustments/Labs and Tests Ordered: Current medicines are reviewed at length with the patient today.  Concerns regarding medicines are outlined above.  Orders Placed This Encounter  Procedures  . Basic metabolic panel   No orders of the defined types were placed in this encounter.   There are no Patient Instructions on file for this visit.   Signed, Sinclair Grooms, MD  04/06/2020 3:55 PM    [place]

## 2020-04-06 NOTE — Patient Instructions (Signed)
Description   Take 1.5 tablets today and then continue to take 1 tablet daily except for 1.5 tablets on Mondays. Recheck INR in 4 weeks. Coumadin clinic [phone].

## 2020-04-06 NOTE — Patient Instructions (Signed)
Medication Instructions:  Your physician recommends that you continue on your current medications as directed. Please refer to the Current Medication list given to you today. *If you need a refill on your cardiac medications before your next appointment, please call your pharmacy*   Lab Work: Today: BMET If you have labs (blood work) drawn today and your tests are completely normal, you will receive your results only by:  [place] (if you have MyChart) OR  A paper copy in the mail If you have any lab test that is abnormal or we need to change your treatment, we will call you to review the results.   Testing/Procedures: none   Follow-Up: At [facility], you and your health needs are our priority.  As part of our continuing mission to provide you with exceptional heart care, we have created designated Provider Care Teams.  These Care Teams include your primary Cardiologist (physician) and Advanced Practice Providers (APPs -  Physician Assistants and Nurse Practitioners) who all work together to provide you with the care you need, when you need it.  We recommend signing up for the patient portal called "MyChart".  Sign up information is provided on this After Visit Summary.  MyChart is used to connect with patients for Virtual Visits (Telemedicine).  Patients are able to view lab/test results, encounter notes, upcoming appointments, etc.  Non-urgent messages can be sent to your provider as well.   To learn more about what you can do with MyChart, go to [email or web address].    Your next appointment:   6 month(s)  The format for your next appointment:   In Person  Provider:   You may see Sinclair Grooms, MD  or one of the following Advanced Practice Providers on your designated Care Team:    Kathyrn Drown, NP

## 2020-04-07 LAB — BASIC METABOLIC PANEL
BUN/Creatinine Ratio: 13 (ref 12–28)
BUN: 13 mg/dL (ref 8–27)
CO2: 26 mmol/L (ref 20–29)
Calcium: 9.6 mg/dL (ref 8.7–10.3)
Chloride: 103 mmol/L (ref 96–106)
Creatinine, Ser: 1 mg/dL (ref 0.57–1.00)
GFR calc Af Amer: 60 mL/min/{1.73_m2} (ref >59)
GFR calc non Af Amer: 52 mL/min/{1.73_m2} — ABNORMAL LOW (ref >59)
Glucose: 99 mg/dL (ref 65–99)
Potassium: 4.2 mmol/L (ref 3.5–5.2)
Sodium: 145 mmol/L — ABNORMAL HIGH (ref 134–144)

## 2020-04-09 NOTE — Progress Notes (Signed)
Histology and Location of Primary Skin Cancer:    Amber [name] presented with the following signs/symptoms: right lower leg nodule in November of last year. She has a history recurring skin cancer dating back to 2006.  Past/Anticipated interventions by patient's surgeon/dermatologist for current problematic lesion, if any:  03/28/2020 Dr. Griselda Miner    Past skin cancers, if any:   SAFETY ISSUES:  Prior radiation? No  Pacemaker/ICD? No  Possible current pregnancy? No--hysterectomy   Is the patient on methotrexate? No  Current Complaints / other details:  Patient has dementia and is taken care of by her daughter [name]. She has received both Pfizer vaccines as well as Product/process development scientist

## 2020-04-10 ENCOUNTER — Ambulatory Visit
Admission: RE | Admit: 2020-04-10 | Discharge: 2020-04-10 | Disposition: A | Discharge status: Home / Self Care | Payer: Medicare PPO | Source: Ambulatory Visit | Attending: Radiation Oncology | Admitting: Radiation Oncology

## 2020-04-10 ENCOUNTER — Encounter: Payer: Self-pay | Admitting: Radiation Oncology

## 2020-04-10 VITALS — BP 130/96 | HR 68 | Temp 97.5°F | Resp 20 | Ht 65.0 in | Wt 138.5 lb

## 2020-04-10 DIAGNOSIS — N183 Chronic kidney disease, stage 3 unspecified: Secondary | ICD-10-CM | POA: Diagnosis not present

## 2020-04-10 DIAGNOSIS — F0390 Unspecified dementia without behavioral disturbance: Secondary | ICD-10-CM | POA: Insufficient documentation

## 2020-04-10 DIAGNOSIS — I129 Hypertensive chronic kidney disease with stage 1 through stage 4 chronic kidney disease, or unspecified chronic kidney disease: Secondary | ICD-10-CM | POA: Diagnosis not present

## 2020-04-10 DIAGNOSIS — Z7901 Long term (current) use of anticoagulants: Secondary | ICD-10-CM | POA: Diagnosis not present

## 2020-04-10 DIAGNOSIS — E039 Hypothyroidism, unspecified: Secondary | ICD-10-CM | POA: Insufficient documentation

## 2020-04-10 DIAGNOSIS — C44722 Squamous cell carcinoma of skin of right lower limb, including hip: Secondary | ICD-10-CM | POA: Diagnosis not present

## 2020-04-10 DIAGNOSIS — Z79899 Other long term (current) drug therapy: Secondary | ICD-10-CM | POA: Diagnosis not present

## 2020-04-10 DIAGNOSIS — D0472 Carcinoma in situ of skin of left lower limb, including hip: Secondary | ICD-10-CM

## 2020-04-10 DIAGNOSIS — M1990 Unspecified osteoarthritis, unspecified site: Secondary | ICD-10-CM | POA: Insufficient documentation

## 2020-04-10 DIAGNOSIS — G4700 Insomnia, unspecified: Secondary | ICD-10-CM | POA: Insufficient documentation

## 2020-04-10 DIAGNOSIS — I482 Chronic atrial fibrillation, unspecified: Secondary | ICD-10-CM | POA: Insufficient documentation

## 2020-04-10 DIAGNOSIS — J439 Emphysema, unspecified: Secondary | ICD-10-CM | POA: Diagnosis not present

## 2020-04-10 DIAGNOSIS — Z87891 Personal history of nicotine dependence: Secondary | ICD-10-CM | POA: Diagnosis not present

## 2020-04-10 DIAGNOSIS — F17210 Nicotine dependence, cigarettes, uncomplicated: Secondary | ICD-10-CM | POA: Diagnosis not present

## 2020-04-10 DIAGNOSIS — E785 Hyperlipidemia, unspecified: Secondary | ICD-10-CM | POA: Diagnosis not present

## 2020-04-10 NOTE — Progress Notes (Signed)
Radiation Oncology         ([phone] ________________________________  Initial Outpatient Consultation  Name: Amber [name] MRN: [id number]  Date: 04/10/2020  DOB: 1934/11/26  PZ:WCHENI, Amber Brow, MD  Griselda Miner, MD   REFERRING PHYSICIAN: Griselda Miner, MD  DIAGNOSIS:    ICD-10-CM   1. Squamous cell carcinoma of right lower leg  C44.722    CHIEF COMPLAINT: Here to discuss management of skin cancer  HISTORY OF PRESENT ILLNESS::Amber [name] is a 86 y.o. female who presented with a history of multiple skin cancers dating back to 2006. I have discussed her case personally with Dr Sarajane Jews to make sure I understand her complex history.  The only area of concern at present is the right lower leg - shin - where recently squamous cell carcinoma was resected by Mohs, and then soon after she developed lesions clinically consistent with reactive keratoacanthomas.    Resection of these multiple raised crusted lesions was not advisable due to concerns of healing issues.  She received Intra lesional 5-FU injections and did not seem to have improvement at her f/u with Dr Sarajane Jews 2 wks ago. However, pt's daughter (a former oncology nurse) reports that in the past 2 wks the lesions seem to have regressed a bit.  Patient has no complaints or symptoms related to these lesions. She has dementia and her daughter is concerned about exposing her to side effects.   SAFETY ISSUES:  Prior radiation? No  Pacemaker/ICD? No  Possible current pregnancy? No--hysterectomy   Is the patient on methotrexate? No  Current Complaints / other details:  Patient has dementia and is taken care of by her daughter [name]. She has received both Pfizer vaccines as well as Product/process development scientist   PREVIOUS RADIATION THERAPY: No  PAST MEDICAL HISTORY:  has a past medical history of Abnormality of gait (08/02/2014), Cerebrovascular disease (05/21/2016), Cervical spinal stenosis, Chronic atrial fibrillation (HCC), Chronic  diastolic CHF (congestive heart failure) ([place]), CKD (chronic kidney disease), stage III ([place]), COPD with emphysema ([place]), Coronary artery disease, CVA (cerebral vascular accident) ([place]), Depression, Fibromyalgia, GERD (gastroesophageal reflux disease), Gout, Hemiparesis and alteration of sensations as late effects of stroke ([place]) (01/17/2015), History of melanoma, Hyperlipidemia, Hypertension, Hypertensive heart disease, Hypothyroidism, Insomnia, Memory change (01/23/2014), Nasal polyposis, Osteoarthritis, Osteoporosis, Pneumonia, and Tobacco abuse.    PAST SURGICAL HISTORY: Past Surgical History:  Procedure Laterality Date  . ABDOMINAL HYSTERECTOMY    . BREAST LUMPECTOMY  1980s   Benign lesion - right  . carpel tunnel     right  . CORONARY ARTERY BYPASS GRAFT  2000  . EYE SURGERY     bilateral cataracts  . IR GENERIC HISTORICAL  03/31/2016   IR RADIOLOGIST EVAL & MGMT 03/31/2016 [facility]  . RADIOLOGY WITH ANESTHESIA  12/24/2011   Procedure: RADIOLOGY WITH ANESTHESIA;  Surgeon: Medication Radiologist, MD;  Location: [place];  Service: Radiology;  Laterality: N/A;  Extra Cranial Vascular Stent  . RADIOLOGY WITH ANESTHESIA N/A 07/19/2014   Procedure: ANGIOPLASTY;  Surgeon: Luanne Bras, MD;  Location: [place];  Service: Radiology;  Laterality: N/A;  . RADIOLOGY WITH ANESTHESIA N/A 07/27/2014   Procedure: ANGIOPLASTY;  Surgeon: Luanne Bras, MD;  Location: [place];  Service: Radiology;  Laterality: N/A;  . TONSILLECTOMY      FAMILY HISTORY: family history includes CAD in her brother and father; Diabetes type II in her brother; Stroke in her mother.  SOCIAL HISTORY:  reports that she quit smoking about 43 years ago. Her smoking use  included cigarettes. She has a 10.00 pack-year smoking history. She has never used smokeless tobacco. She reports that she does not drink alcohol and does not use drugs.  ALLERGIES: Ezetimibe, Welchol [colesevelam hcl], Adhesive [tape], Ceclor [cefaclor],  Elastic bandages & [zinc], Latex, Lipitor [atorvastatin calcium], Mevacor [lovastatin], Pravachol, and Vasotec  MEDICATIONS:  Current Outpatient Medications  Medication Sig Dispense Refill  . allopurinol (ZYLOPRIM) 300 MG tablet Take 300 mg by mouth at bedtime.     [name] amLODipine (NORVASC) 5 MG tablet Take 1 tablet (5 mg total) by mouth daily. 90 tablet 3  . citalopram (CELEXA) 20 MG tablet TAKE 1 TABLET BY MOUTH TWICE A DAY 60 tablet 11  . clopidogrel (PLAVIX) 75 MG tablet TAKE 1 TABLET (75 MG TOTAL) BY MOUTH DAILY. 90 tablet 3  . diphenoxylate-atropine (LOMOTIL) 2.5-0.025 MG tablet Take 1 tablet by mouth every Monday, Wednesday, and Friday.    . donepezil (ARICEPT) 5 MG tablet Take 1 tablet (5 mg total) by mouth daily. 90 tablet 3  . ergocalciferol (VITAMIN D2) 50000 UNITS capsule Take 50,000 Units by mouth 2 (two) times a week. Tuesday and Friday    . fluticasone (FLONASE) 50 MCG/ACT nasal spray Place 1 spray into both nostrils daily.     . furosemide (LASIX) 80 MG tablet Take 1 tablet (80 mg total) by mouth daily. 90 tablet 3  . ipratropium-albuterol (DUONEB) 0.5-2.5 (3) MG/3ML SOLN Take 3 mLs by nebulization every 4 (four) hours as needed (Use at home). 360 mL 0  . levothyroxine (SYNTHROID, LEVOTHROID) 25 MCG tablet Take 25 mcg by mouth every morning.     [name] losartan (COZAAR) 25 MG tablet TAKE 1 TABLET BY MOUTH EVERY DAY 90 tablet 3  . memantine (NAMENDA) 10 MG tablet Take 1 tablet (10 mg total) by mouth 2 (two) times daily. 180 tablet 3  . metoprolol succinate (TOPROL-XL) 25 MG 24 hr tablet TAKE 1 TABLET BY MOUTH EVERY DAY 90 tablet 3  . nitroGLYCERIN (NITROSTAT) 0.4 MG SL tablet Place 1 tablet (0.4 mg total) under the tongue every 5 (five) minutes as needed for chest pain. 25 tablet 3  . potassium chloride SA (KLOR-CON M20) 20 MEQ tablet TAKE 1 TABLET EVERY OTHER DAY, ALTERNATING WITH 2 TABLETS EVERY OTHER DAY 135 tablet 2  . warfarin (COUMADIN) 5 MG tablet TAKE AS DIRECTED BY COUMADIN  CLINIC 100 tablet 0  . ZINC-VITAMIN C PO Take 1 tablet by mouth every evening.     No current facility-administered medications for this encounter.    REVIEW OF SYSTEMS:  Notable for that above.   PHYSICAL EXAM:  height is 5\' 5"  (1.651 m) and weight is 138 lb 8 oz (62.8 kg). Her temporal temperature is 97.5 F (36.4 C) (abnormal). Her blood pressure is 130/96 (abnormal) and her pulse is 68. Her respiration is 20 and oxygen saturation is 99%.   General: Alert and oriented, in no acute distress  - in a wheelchair - her daughter provided the history.  Skin: see photo - multiple crusted raised lesions along scar on right shin   ECOG = 3  0 - Asymptomatic (Fully active, able to carry on all predisease activities without restriction)  1 - Symptomatic but completely ambulatory (Restricted in physically strenuous activity but ambulatory and able to carry out work of a light or sedentary nature. For example, light housework, office work)  2 - Symptomatic, <50% in bed during the day (Ambulatory and capable of all self care but unable to carry out  any work activities. Up and about more than 50% of waking hours)  3 - Symptomatic, >50% in bed, but not bedbound (Capable of only limited self-care, confined to bed or chair 50% or more of waking hours)  4 - Bedbound (Completely disabled. Cannot carry on any self-care. Totally confined to bed or chair)  5 - Death   Eustace Pen MM, Creech RH, Tormey DC, et al. [phone]). "Toxicity and response criteria of the [facility]". [place] Oncol. 5 (6): 649-55   LABORATORY DATA:  Lab Results  Component Value Date   WBC 11.0 (H) 08/02/2019   HGB 11.9 (L) 08/02/2019   HCT 38.0 08/02/2019   MCV 91.3 08/02/2019   PLT 362 08/02/2019   CMP     Component Value Date/Time   NA 145 (H) 04/06/2020 1603   K 4.2 04/06/2020 1603   CL 103 04/06/2020 1603   CO2 26 04/06/2020 1603   GLUCOSE 99 04/06/2020 1603   GLUCOSE 94 08/03/2019 0746   BUN  13 04/06/2020 1603   CREATININE 1.00 04/06/2020 1603   CREATININE 0.97 (H) 11/09/2015 1326   CALCIUM 9.6 04/06/2020 1603   PROT 6.3 (L) 08/01/2019 0543   PROT 6.5 08/31/2018 1601   ALBUMIN 3.6 08/01/2019 0543   ALBUMIN 4.6 08/31/2018 1601   AST 49 (H) 08/01/2019 0543   ALT 48 (H) 08/01/2019 0543   ALKPHOS 50 08/01/2019 0543   BILITOT 0.7 08/01/2019 0543   BILITOT 0.4 08/31/2018 1601   GFRNONAA 52 (L) 04/06/2020 1603   GFRAA 60 04/06/2020 1603     RADIOGRAPHY: No results found.    IMPRESSION/PLAN: Skin cancer, post Mohs, with subsequent reactive keratoacanthomas (clinical diagnosis)  Today, I talked to the patient about the findings and work-up thus far. We discussed the patient's diagnosis of reactive keratoacanthomas of the right shin and general treatment for this, highlighting the role of radiotherapy in the management. We discussed the available radiation techniques, and focused on the details of logistics and delivery.    We discussed the risks, benefits, and side effects of electron radiotherapy. Side effects may include but not necessarily be limited to: severe non healing wound of the shin and possible chronic infection and pain. These risks are substantial and the area seems to be improving per patient's daughter. I explained to them, and to Dr Sarajane Jews by phone, that I think the risks of RT outweigh potential benefits.  Sometimes this condition regresses on its own.  They will continue to observe it and call Dr Sarajane Jews if it worsens.  I defer to Dr Sarajane Jews on scheduling surveillance checkups for her dermatologic needs.  I explained to all of them that I am happy to make a referral to [facility] or another university if her condition worsens and they wish to discuss radiotherapy with another provider.  They are agreeable to this plan. I will see her PRN - I wished her the best.   On date of service, in total, I spent 45 minutes on this encounter. Patient was seen in  person.   __________________________________________   Eppie Gibson, MD  This document serves as a record of services personally performed by Eppie Gibson, MD. It was created on his behalf by Clerance Lav, a trained medical scribe. The creation of this record is based on the scribe's personal observations and the provider's statements to them. This document has been checked and approved by the attending provider.

## 2020-04-15 ENCOUNTER — Encounter: Payer: Self-pay | Admitting: Radiation Oncology

## 2020-04-17 NOTE — Progress Notes (Signed)
PATIENT: Amber [name] DOB: November 22, 1934  REASON FOR VISIT: follow up HISTORY FROM: patient  HISTORY OF PRESENT ILLNESS: Today 04/18/20  Amber [name] is an 86 year old female with history of cerebrovascular disease with a stroke that left her with left hemiparesis.  She has a gait disturbance and progressive dementing illness.  She is on Aricept and Namenda, only low-dose Aricept due to vivid dreams.  Could not tolerate amitriptyline due to visual and auditory hallucinations, no longer has these.  Has been given Risperdal for the hallucinations, but never started it. MMSE 17/30 today.  Lives with her husband, her daughters take turns staying during the night.  She sleeps in a recliner due to CHF.  Her mood is overall good.  She enjoys watching TV, using Facebook.  Suffered a fall in March 2021, had left-sided 8-11 rib fractures, T2 compression fracture, with multiple other worsening and chronic compression fractures in her spine.  CT head was negative.  Admitted to the hospital for a few days.  Discharged home with [facility], [place], 24 care with daughter.  Hospitalized for bronchitis in June.  Appetite is fair, eats a [employer] biscuit every morning, goes out to eat every night, eats a sweet potato. Needs assistance with ADLs. Very pleasant, in good spirits. Here today for evaluation accompanied by her daughter, [name].  Update April 19, 2019 SS: Amber [name] is an 86 year old female with history of cerebrovascular disease with a stroke that left her with a left hemiparesis.  She has a gait disturbance, a progressive dementing illness.  She remains on Aricept and Namenda, she is on low-dose Aricept 5 mg due to vivid dreams.  At last visit the amitriptyline was stopped due to reported visual and auditory hallucinations.  She has a prescription for Risperdal, but has never taken it.  She feels her memory is stable.  She lives with her husband, her daughters check on her every day, they have been alternating staying the  night, as she had 2 significant falls last year.  She sleeps in a recliner, due to CHF issues, they sleep on the couch, just in case she needs to go to the bathroom.  She no longer has hallucinations, previously these have been associated with UTI.  She uses a walker to ambulate.  Her daughters put her pills in a pillbox, she takes them.  Lately, she seems to have found a burst of energy, is seeking more independence, may get frustrated with her daughters if they try to help her too much, no signs of infection.  She does not do any cooking, they get take out.  She presents today accompanied by her daughter Lambert Keto.  HISTORY 10/07/2018 Dr. Jannifer Franklin: Amber [name] is an 86 year old right-handed white female with a history of cerebrovascular disease with a stroke that left her with a left hemiparesis.  She has a gait disturbance associated with this and she has developed a progressive dementing illness.  She has been on Aricept and Namenda, she has vivid dreams at night, but taking the Aricept in the morning still does not correct all of the difficulty in the evenings.  The patient has had some problems with hallucinations but recently she has had a urinary tract infection.  Once treated, the patient has not had any hallucinations.  She does have a prescription for Risperdal but never took the medication.  She is on amitriptyline 10 mg every other day, she seems to be tolerating this dose reduction fairly well.  She returns for an  evaluation.  She recently fell 3 weeks ago and had a pelvic ramus fracture.  She has recovered from this fairly well.   REVIEW OF SYSTEMS: Out of a complete 14 system review of symptoms, the patient complains only of the following symptoms, and all other reviewed systems are negative.  Memory loss  ALLERGIES: Allergies  Allergen Reactions   Ezetimibe Other (See Comments)    Myalgia   Welchol [Colesevelam Hcl] Other (See Comments)    Muscle aches   Adhesive [Tape] Itching, Rash and  Other (See Comments)    Burning    Ceclor [Cefaclor] Other (See Comments)    Unknown allergic reaction   Elastic Bandages & [Zinc] Rash and Other (See Comments)    Turns red on the areas it touches   Latex Itching, Rash and Other (See Comments)    Burning    Lipitor [Atorvastatin Calcium] Other (See Comments)    Increased fibromyalgia pain   Mevacor [Lovastatin] Other (See Comments)    Increased fibromyalgia pain   Pravachol Other (See Comments)    Increased fibromyalgia pain   Vasotec Other (See Comments)    Unknown allergic reaction    HOME MEDICATIONS: Outpatient Medications Prior to Visit  Medication Sig Dispense Refill   allopurinol (ZYLOPRIM) 300 MG tablet Take 300 mg by mouth at bedtime.      amLODipine (NORVASC) 5 MG tablet Take 1 tablet (5 mg total) by mouth daily. 90 tablet 3   citalopram (CELEXA) 20 MG tablet TAKE 1 TABLET BY MOUTH TWICE A DAY 60 tablet 11   clopidogrel (PLAVIX) 75 MG tablet TAKE 1 TABLET (75 MG TOTAL) BY MOUTH DAILY. 90 tablet 3   diphenoxylate-atropine (LOMOTIL) 2.5-0.025 MG tablet Take 1 tablet by mouth every Monday, Wednesday, and Friday.     donepezil (ARICEPT) 5 MG tablet Take 1 tablet (5 mg total) by mouth daily. 90 tablet 3   ergocalciferol (VITAMIN D2) 50000 UNITS capsule Take 50,000 Units by mouth 2 (two) times a week. Tuesday and Friday     fluticasone (FLONASE) 50 MCG/ACT nasal spray Place 1 spray into both nostrils daily.      furosemide (LASIX) 80 MG tablet Take 1 tablet (80 mg total) by mouth daily. 90 tablet 3   ipratropium-albuterol (DUONEB) 0.5-2.5 (3) MG/3ML SOLN Take 3 mLs by nebulization every 4 (four) hours as needed (Use at home). 360 mL 0   levothyroxine (SYNTHROID, LEVOTHROID) 25 MCG tablet Take 25 mcg by mouth every morning.      losartan (COZAAR) 25 MG tablet TAKE 1 TABLET BY MOUTH EVERY DAY 90 tablet 3   memantine (NAMENDA) 10 MG tablet Take 1 tablet (10 mg total) by mouth 2 (two) times daily. 180 tablet 3    metoprolol succinate (TOPROL-XL) 25 MG 24 hr tablet TAKE 1 TABLET BY MOUTH EVERY DAY 90 tablet 3   nitroGLYCERIN (NITROSTAT) 0.4 MG SL tablet Place 1 tablet (0.4 mg total) under the tongue every 5 (five) minutes as needed for chest pain. 25 tablet 3   potassium chloride SA (KLOR-CON M20) 20 MEQ tablet TAKE 1 TABLET EVERY OTHER DAY, ALTERNATING WITH 2 TABLETS EVERY OTHER DAY 135 tablet 2   warfarin (COUMADIN) 5 MG tablet TAKE AS DIRECTED BY COUMADIN CLINIC 100 tablet 0   ZINC-VITAMIN C PO Take 1 tablet by mouth every evening.     No facility-administered medications prior to visit.    PAST MEDICAL HISTORY: Past Medical History:  Diagnosis Date   Abnormality of gait 08/02/2014  Cerebrovascular disease 05/21/2016   Cervical spinal stenosis    Chronic atrial fibrillation (HCC)    a. chronic/rate controlled;  b. chronic coumadin.   Chronic diastolic CHF (congestive heart failure) ([place])    a. 02/2013 Echo: EF 55-60%, mild AI, mod dil LA.   CKD (chronic kidney disease), stage III (HCC)    COPD with emphysema (HCC)    PFT 05/02/10>>FEV1 1.35(62%), FEV1% 66, DLCO 75%   Coronary artery disease    a. s/p CABG;  b. Abnormal nuc 2015 - managed medically.   CVA (cerebral vascular accident) ([place])    left sided weakness   Depression    Fibromyalgia    GERD (gastroesophageal reflux disease)    pepcid 2-3 times per week   Gout    Hemiparesis and alteration of sensations as late effects of stroke ([place]) 01/17/2015   History of melanoma    squamous cell, melanoma   Hyperlipidemia    a. statin intolerant, not felt to be candidate for PCSK9 due to chronically elevated CK levels.   Hypertension    Hypertensive heart disease    Hypothyroidism    Insomnia    Memory change 01/23/2014   Nasal polyposis    Osteoarthritis    Osteoporosis    Pneumonia    1990   Tobacco abuse     PAST SURGICAL HISTORY: Past Surgical History:  Procedure Laterality Date   ABDOMINAL  HYSTERECTOMY     BREAST LUMPECTOMY  1980s   Benign lesion - right   carpel tunnel     right   CORONARY ARTERY BYPASS GRAFT  2000   EYE SURGERY     bilateral cataracts   IR GENERIC HISTORICAL  03/31/2016   IR RADIOLOGIST EVAL & MGMT 03/31/2016 [facility]   RADIOLOGY WITH ANESTHESIA  12/24/2011   Procedure: RADIOLOGY WITH ANESTHESIA;  Surgeon: Medication Radiologist, MD;  Location: [facility];  Service: Radiology;  Laterality: N/A;  Extra Cranial Vascular Stent   RADIOLOGY WITH ANESTHESIA N/A 07/19/2014   Procedure: ANGIOPLASTY;  Surgeon: Luanne Bras, MD;  Location: [facility];  Service: Radiology;  Laterality: N/A;   RADIOLOGY WITH ANESTHESIA N/A 07/27/2014   Procedure: ANGIOPLASTY;  Surgeon: Luanne Bras, MD;  Location: [facility];  Service: Radiology;  Laterality: N/A;   TONSILLECTOMY      FAMILY HISTORY: Family History  Problem Relation Age of Onset   Stroke Mother    CAD Father    Diabetes type II Brother    CAD Brother     SOCIAL HISTORY: Social History   Socioeconomic History   Marital status: Married    Spouse name: Not on file   Number of children: 2   Years of education: Not on file   Highest education level: Not on file  Occupational History   Occupation: Retired    Fish farm manager: Psychologist, sport and exercise [employer]   Occupation: Network engineer    Comment: Retired  Tobacco Use   Smoking status: Former Smoker    Packs/day: 0.40    Years: 25.00    Pack years: 10.00    Types: Cigarettes    Quit date: 02/10/1977    Years since quitting: 43.2   Smokeless tobacco: Never Used  Vaping Use   Vaping Use: Never used  Substance and Sexual Activity   Alcohol use: No   Drug use: No   Sexual activity: Not Currently  Other Topics Concern   Not on file  Social History Narrative   Patient is right handed.   Patient lives at home  with husband   Patient drinks 8 oz caffeine occasionally.   Social Determinants of Health   Financial Resource Strain: Not on file  Food  Insecurity: Not on file  Transportation Needs: Not on file  Physical Activity: Not on file  Stress: Not on file  Social Connections: Not on file  Intimate Partner Violence: Not on file    PHYSICAL EXAM  Vitals:   04/18/20 1434  BP: 122/80  Pulse: (!) 54  Weight: 142 lb (64.4 kg)   Body mass index is 23.63 kg/m.  Generalized: Well developed, in no acute distress  MMSE - Mini Mental State Exam 04/18/2020 04/19/2019 10/07/2018  Not completed: - (No Data) (No Data)  Orientation to time 1 2 3   Orientation to Place 5 3 3   Registration 3 3 3   Attention/ Calculation 1 1 1   Recall 0 0 0  Language- name 2 objects 1 2 1   Language- repeat 1 1 1   Language- follow 3 step command 2 2 3   Language- follow 3 step command-comments - she gave the paper back to me after she folded it -  Language- read & follow direction 1 1 1   Write a sentence 1 1 1   Copy design 1 1 1   Total score 17 17 18     Neurological examination  Mentation: Alert oriented to time, place, history taking but most is provided by her daughter. Follows all commands speech and language fluent, some repetitive questioning noted Cranial nerve II-XII: Pupils were equal round reactive to light. Extraocular movements were full, visual field were full on confrontational test. Facial sensation and strength were normal.  Head turning and shoulder shrug  were normal and symmetric. Motor: Good strength of all extremities, has a brace on the left ankle Sensory: Sensory testing is intact to soft touch on all 4 extremities. No evidence of extinction is noted.  Coordination: Cerebellar testing reveals good finger-nose-finger and heel-to-shin bilaterally.  Gait and station: She is able to rise from seated position with pushoff is able to walk with walker, somewhat wide-based, but stable, good pace Reflexes: Deep tendon reflexes are symmetric  DIAGNOSTIC DATA (LABS, IMAGING, TESTING) - I reviewed patient records, labs, notes, testing and imaging  myself where available.  Lab Results  Component Value Date   WBC 11.0 (H) 08/02/2019   HGB 11.9 (L) 08/02/2019   HCT 38.0 08/02/2019   MCV 91.3 08/02/2019   PLT 362 08/02/2019      Component Value Date/Time   NA 145 (H) 04/06/2020 1603   K 4.2 04/06/2020 1603   CL 103 04/06/2020 1603   CO2 26 04/06/2020 1603   GLUCOSE 99 04/06/2020 1603   GLUCOSE 94 08/03/2019 0746   BUN 13 04/06/2020 1603   CREATININE 1.00 04/06/2020 1603   CREATININE 0.97 (H) 11/09/2015 1326   CALCIUM 9.6 04/06/2020 1603   PROT 6.3 (L) 08/01/2019 0543   PROT 6.5 08/31/2018 1601   ALBUMIN 3.6 08/01/2019 0543   ALBUMIN 4.6 08/31/2018 1601   AST 49 (H) 08/01/2019 0543   ALT 48 (H) 08/01/2019 0543   ALKPHOS 50 08/01/2019 0543   BILITOT 0.7 08/01/2019 0543   BILITOT 0.4 08/31/2018 1601   GFRNONAA 52 (L) 04/06/2020 1603   GFRAA 60 04/06/2020 1603   Lab Results  Component Value Date   CHOL 228 (H) 03/06/2017   HDL 62 03/06/2017   LDLCALC 159 (H) 03/06/2017   LDLDIRECT 236.0 08/02/2014   TRIG 34 03/06/2017   CHOLHDL 3.7 03/06/2017   Lab Results  Component Value Date   HGBA1C 6.0 (H) 03/05/2017   Lab Results  Component Value Date   VITAMINB12 335 03/05/2017   Lab Results  Component Value Date   TSH 2.045 03/05/2017   ASSESSMENT AND PLAN 86 y.o. year old female  has a past medical history of Abnormality of gait (08/02/2014), Cerebrovascular disease (05/21/2016), Cervical spinal stenosis, Chronic atrial fibrillation (HCC), Chronic diastolic CHF (congestive heart failure) ([place]), CKD (chronic kidney disease), stage III ([place]), COPD with emphysema ([place]), Coronary artery disease, CVA (cerebral vascular accident) ([place]), Depression, Fibromyalgia, GERD (gastroesophageal reflux disease), Gout, Hemiparesis and alteration of sensations as late effects of stroke ([place]) (01/17/2015), History of melanoma, Hyperlipidemia, Hypertension, Hypertensive heart disease, Hypothyroidism, Insomnia, Memory change (01/23/2014), Nasal  polyposis, Osteoarthritis, Osteoporosis, Pneumonia, and Tobacco abuse. here with:  1.  Dementia 2.  Cerebrovascular disease, left hemiparesis 3.  Gait disorder  -Remains overall stable, MMSE 17/30 -Continue Aricept 5 mg daily, low dose due to vivid dreams -Continue Namenda 10 mg twice a day -No longer having hallucinations -Be careful not to fall, using [place], family providing 24 hr care -Follow-up in 1 year or sooner if needed  I spent 30 minutes of face-to-face and non-face-to-face time with patient.  This included previsit chart review, lab review, study review, order entry, electronic health record documentation, patient education.  Butler Denmark, AGNP-C, DNP 04/18/2020, 2:50 PM [facility] [address], [place], [place] [address] [phone]

## 2020-04-18 ENCOUNTER — Ambulatory Visit: Payer: Medicare PPO | Admitting: Neurology

## 2020-04-18 ENCOUNTER — Other Ambulatory Visit: Payer: Self-pay

## 2020-04-18 ENCOUNTER — Encounter: Payer: Self-pay | Admitting: Neurology

## 2020-04-18 VITALS — BP 122/80 | HR 54 | Wt 142.0 lb

## 2020-04-18 DIAGNOSIS — I679 Cerebrovascular disease, unspecified: Secondary | ICD-10-CM

## 2020-04-18 DIAGNOSIS — F0390 Unspecified dementia without behavioral disturbance: Secondary | ICD-10-CM | POA: Diagnosis not present

## 2020-04-18 MED ORDER — DONEPEZIL HCL 5 MG PO TABS: 5.0000 mg | ORAL_TABLET | Freq: Every day | ORAL | 3 refills | Status: DC

## 2020-04-18 MED ORDER — MEMANTINE HCL 10 MG PO TABS: 10.0000 mg | ORAL_TABLET | Freq: Two times a day (BID) | ORAL | 3 refills | Status: DC

## 2020-04-18 NOTE — Patient Instructions (Signed)
Continue current medications Continue to see the primary care doctor See you back in 1 year

## 2020-04-22 NOTE — Progress Notes (Signed)
I have read the note, and I agree with the clinical assessment and plan.  Joshuah Minella K Lashay Osborne   

## 2020-04-25 ENCOUNTER — Ambulatory Visit (INDEPENDENT_AMBULATORY_CARE_PROVIDER_SITE_OTHER): Payer: Medicare PPO | Admitting: Otolaryngology

## 2020-04-25 ENCOUNTER — Other Ambulatory Visit: Payer: Self-pay

## 2020-04-25 VITALS — Temp 98.1°F

## 2020-04-25 DIAGNOSIS — H903 Sensorineural hearing loss, bilateral: Secondary | ICD-10-CM

## 2020-04-25 DIAGNOSIS — H6123 Impacted cerumen, bilateral: Secondary | ICD-10-CM

## 2020-04-25 NOTE — Progress Notes (Signed)
HPI: Amber [name] is a 86 y.o. female who presents for evaluation of wax buildup in her ears.  She has underlying hearing loss.  She was last cleaned with Dr. Ernesto Rutherford in his office several years ago..  Past Medical History:  Diagnosis Date  . Abnormality of gait 08/02/2014  . Cerebrovascular disease 05/21/2016  . Cervical spinal stenosis   . Chronic atrial fibrillation (HCC)    a. chronic/rate controlled;  b. chronic coumadin.  . Chronic diastolic CHF (congestive heart failure) ([place])    a. 02/2013 Echo: EF 55-60%, mild AI, mod dil LA.  . CKD (chronic kidney disease), stage III ([place])   . COPD with emphysema ([place])    PFT 05/02/10>>FEV1 1.35(62%), FEV1% 66, DLCO 75%  . Coronary artery disease    a. s/p CABG;  b. Abnormal nuc 2015 - managed medically.  . CVA (cerebral vascular accident) ([place])    left sided weakness  . Depression   . Fibromyalgia   . GERD (gastroesophageal reflux disease)    pepcid 2-3 times per week  . Gout   . Hemiparesis and alteration of sensations as late effects of stroke ([place]) 01/17/2015  . History of melanoma    squamous cell, melanoma  . Hyperlipidemia    a. statin intolerant, not felt to be candidate for PCSK9 due to chronically elevated CK levels.  . Hypertension   . Hypertensive heart disease   . Hypothyroidism   . Insomnia   . Memory change 01/23/2014  . Nasal polyposis   . Osteoarthritis   . Osteoporosis   . Pneumonia    1990  . Tobacco abuse    Past Surgical History:  Procedure Laterality Date  . ABDOMINAL HYSTERECTOMY    . BREAST LUMPECTOMY  1980s   Benign lesion - right  . carpel tunnel     right  . CORONARY ARTERY BYPASS GRAFT  2000  . EYE SURGERY     bilateral cataracts  . IR GENERIC HISTORICAL  03/31/2016   IR RADIOLOGIST EVAL & MGMT 03/31/2016 [facility]  . RADIOLOGY WITH ANESTHESIA  12/24/2011   Procedure: RADIOLOGY WITH ANESTHESIA;  Surgeon: Medication Radiologist, MD;  Location: [facility];  Service: Radiology;  Laterality: N/A;   Extra Cranial Vascular Stent  . RADIOLOGY WITH ANESTHESIA N/A 07/19/2014   Procedure: ANGIOPLASTY;  Surgeon: Luanne Bras, MD;  Location: [facility];  Service: Radiology;  Laterality: N/A;  . RADIOLOGY WITH ANESTHESIA N/A 07/27/2014   Procedure: ANGIOPLASTY;  Surgeon: Luanne Bras, MD;  Location: [facility];  Service: Radiology;  Laterality: N/A;  . TONSILLECTOMY     Social History   Socioeconomic History  . Marital status: Married    Spouse name: Not on file  . Number of children: 2  . Years of education: Not on file  . Highest education level: Not on file  Occupational History  . Occupation: Retired    Fish farm manager: [employer]  . Occupation: Network engineer    Comment: Retired  Tobacco Use  . Smoking status: Former Smoker    Packs/day: 0.40    Years: 25.00    Pack years: 10.00    Types: Cigarettes    Quit date: 02/10/1977    Years since quitting: 43.2  . Smokeless tobacco: Never Used  Vaping Use  . Vaping Use: Never used  Substance and Sexual Activity  . Alcohol use: No  . Drug use: No  . Sexual activity: Not Currently  Other Topics Concern  . Not on file  Social History Narrative  Patient is right handed.   Patient lives at home with husband   Patient drinks 8 oz caffeine occasionally.   Social Determinants of Health   Financial Resource Strain: Not on file  Food Insecurity: Not on file  Transportation Needs: Not on file  Physical Activity: Not on file  Stress: Not on file  Social Connections: Not on file   Family History  Problem Relation Age of Onset  . Stroke Mother   . CAD Father   . Diabetes type II Brother   . CAD Brother    Allergies  Allergen Reactions  . Ezetimibe Other (See Comments)    Myalgia  . Welchol [Colesevelam Hcl] Other (See Comments)    Muscle aches  . Adhesive [Tape] Itching, Rash and Other (See Comments)    Burning   . Ceclor [Cefaclor] Other (See Comments)    Unknown allergic reaction  . Elastic Bandages & [Zinc] Rash and Other  (See Comments)    Turns red on the areas it touches  . Latex Itching, Rash and Other (See Comments)    Burning   . Lipitor [Atorvastatin Calcium] Other (See Comments)    Increased fibromyalgia pain  . Mevacor [Lovastatin] Other (See Comments)    Increased fibromyalgia pain  . Pravachol Other (See Comments)    Increased fibromyalgia pain  . Vasotec Other (See Comments)    Unknown allergic reaction   Prior to Admission medications   Medication Sig Start Date End Date Taking? Authorizing Provider  allopurinol (ZYLOPRIM) 300 MG tablet Take 300 mg by mouth at bedtime.     [provider]  amLODipine (NORVASC) 5 MG tablet Take 1 tablet (5 mg total) by mouth daily. 08/08/19   Belva Crome, MD  citalopram (CELEXA) 20 MG tablet TAKE 1 TABLET BY MOUTH TWICE A DAY 11/09/14   Darlin Coco, MD  clopidogrel (PLAVIX) 75 MG tablet TAKE 1 TABLET (75 MG TOTAL) BY MOUTH DAILY. 01/02/20   Belva Crome, MD  diphenoxylate-atropine (LOMOTIL) 2.5-0.025 MG tablet Take 1 tablet by mouth every Monday, Wednesday, and Friday.    [provider]  donepezil (ARICEPT) 5 MG tablet Take 1 tablet (5 mg total) by mouth daily. 04/18/20   Suzzanne Cloud, NP  ergocalciferol (VITAMIN D2) 50000 UNITS capsule Take 50,000 Units by mouth 2 (two) times a week. Tuesday and Friday    [provider]  fluticasone (FLONASE) 50 MCG/ACT nasal spray Place 1 spray into both nostrils daily.  07/22/19   [provider]  furosemide (LASIX) 80 MG tablet Take 1 tablet (80 mg total) by mouth daily. 11/10/19   Belva Crome, MD  ipratropium-albuterol (DUONEB) 0.5-2.5 (3) MG/3ML SOLN Take 3 mLs by nebulization every 4 (four) hours as needed (Use at home). 08/04/19   Mariel Aloe, MD  levothyroxine (SYNTHROID, LEVOTHROID) 25 MCG tablet Take 25 mcg by mouth every morning.     [provider]  losartan (COZAAR) 25 MG tablet TAKE 1 TABLET BY MOUTH EVERY DAY 09/15/19   Burtis Junes, NP  memantine  (NAMENDA) 10 MG tablet Take 1 tablet (10 mg total) by mouth 2 (two) times daily. 04/18/20   Suzzanne Cloud, NP  metoprolol succinate (TOPROL-XL) 25 MG 24 hr tablet TAKE 1 TABLET BY MOUTH EVERY DAY 10/19/19   Belva Crome, MD  nitroGLYCERIN (NITROSTAT) 0.4 MG SL tablet Place 1 tablet (0.4 mg total) under the tongue every 5 (five) minutes as needed for chest pain. 09/30/19   Tamala Julian,  Lynnell Dike, MD  potassium chloride SA (KLOR-CON M20) 20 MEQ tablet TAKE 1 TABLET EVERY OTHER DAY, ALTERNATING WITH 2 TABLETS EVERY OTHER DAY 09/28/19   Belva Crome, MD  warfarin (COUMADIN) 5 MG tablet TAKE AS DIRECTED BY COUMADIN CLINIC 01/02/20   Belva Crome, MD  ZINC-VITAMIN C PO Take 1 tablet by mouth every evening.    [provider]     Positive ROS: Otherwise negative  All other systems have been reviewed and were otherwise negative with the exception of those mentioned in the HPI and as above.  Physical Exam: Constitutional: Alert, well-appearing, no acute distress Ears: External ears without lesions or tenderness. Ear canals are impacted with cerumen on both sides that was cleaned with suction.  Ear canals and TMs are otherwise clear.. Nasal: External nose without lesions. Clear nasal passages Oral: Oropharynx clear. Neck: No palpable adenopathy or masses Respiratory: Breathing comfortably  Skin: No facial/neck lesions or rash noted.  Cerumen impaction removal  Date/Time: 04/25/2020 11:12 AM Performed by: Rozetta Nunnery, MD Authorized by: Rozetta Nunnery, MD   Consent:    Consent obtained:  Verbal   Consent given by:  Patient   Risks discussed:  Pain and bleeding Procedure details:    Location:  L ear and R ear   Procedure type: suction   Post-procedure details:    Inspection:  TM intact and canal normal   Hearing quality:  Improved   Patient tolerance of procedure:  Tolerated well, no immediate complications Comments:     Ear canals and TMs are otherwise  clear.    Assessment: Patient with bilateral cerumen impactions.  Plan: This was cleaned in the office using suction.  TMs are otherwise clear. She has underlying sensorineural hearing loss in both ears. She also has history of CHF and cannot lie flat.  Radene Journey, MD

## 2020-05-07 ENCOUNTER — Other Ambulatory Visit: Payer: Self-pay | Admitting: Interventional Cardiology

## 2020-05-07 NOTE — Telephone Encounter (Addendum)
Prescription refill request received for warfarin Lov: 04/06/2020, Tamala Julian Next INR check: 3/30 Warfarin tablet strength: 5mg 

## 2020-05-09 ENCOUNTER — Ambulatory Visit (INDEPENDENT_AMBULATORY_CARE_PROVIDER_SITE_OTHER): Payer: Medicare PPO | Admitting: *Deleted

## 2020-05-09 ENCOUNTER — Other Ambulatory Visit: Payer: Self-pay

## 2020-05-09 DIAGNOSIS — Z5181 Encounter for therapeutic drug level monitoring: Secondary | ICD-10-CM

## 2020-05-09 DIAGNOSIS — I4891 Unspecified atrial fibrillation: Secondary | ICD-10-CM

## 2020-05-09 LAB — POCT INR: INR: 3 (ref 2.0–3.0)

## 2020-05-09 NOTE — Patient Instructions (Signed)
Description   Continue to take 1 tablet daily except for 1.5 tablets on Mondays. Recheck INR in 6 weeks. Coumadin clinic [phone].

## 2020-05-22 DIAGNOSIS — L570 Actinic keratosis: Secondary | ICD-10-CM | POA: Diagnosis not present

## 2020-05-22 DIAGNOSIS — D485 Neoplasm of uncertain behavior of skin: Secondary | ICD-10-CM | POA: Diagnosis not present

## 2020-05-22 DIAGNOSIS — C44629 Squamous cell carcinoma of skin of left upper limb, including shoulder: Secondary | ICD-10-CM | POA: Diagnosis not present

## 2020-05-22 DIAGNOSIS — Z85828 Personal history of other malignant neoplasm of skin: Secondary | ICD-10-CM | POA: Diagnosis not present

## 2020-05-22 DIAGNOSIS — C44722 Squamous cell carcinoma of skin of right lower limb, including hip: Secondary | ICD-10-CM | POA: Diagnosis not present

## 2020-05-22 DIAGNOSIS — C44311 Basal cell carcinoma of skin of nose: Secondary | ICD-10-CM | POA: Diagnosis not present

## 2020-06-20 ENCOUNTER — Ambulatory Visit (INDEPENDENT_AMBULATORY_CARE_PROVIDER_SITE_OTHER): Payer: Medicare PPO | Admitting: *Deleted

## 2020-06-20 ENCOUNTER — Other Ambulatory Visit: Payer: Self-pay

## 2020-06-20 DIAGNOSIS — I4891 Unspecified atrial fibrillation: Secondary | ICD-10-CM | POA: Diagnosis not present

## 2020-06-20 DIAGNOSIS — Z5181 Encounter for therapeutic drug level monitoring: Secondary | ICD-10-CM

## 2020-06-20 DIAGNOSIS — C44311 Basal cell carcinoma of skin of nose: Secondary | ICD-10-CM | POA: Diagnosis not present

## 2020-06-20 DIAGNOSIS — Z85828 Personal history of other malignant neoplasm of skin: Secondary | ICD-10-CM | POA: Diagnosis not present

## 2020-06-20 LAB — POCT INR: INR: 2.2 (ref 2.0–3.0)

## 2020-06-20 NOTE — Patient Instructions (Signed)
Description   Continue taking 1 tablet daily except for 1.5 tablets on Mondays. Recheck INR in 6 weeks. Coumadin clinic [phone].

## 2020-06-28 DIAGNOSIS — C44729 Squamous cell carcinoma of skin of left lower limb, including hip: Secondary | ICD-10-CM | POA: Diagnosis not present

## 2020-06-28 DIAGNOSIS — C44722 Squamous cell carcinoma of skin of right lower limb, including hip: Secondary | ICD-10-CM | POA: Diagnosis not present

## 2020-08-01 ENCOUNTER — Ambulatory Visit (INDEPENDENT_AMBULATORY_CARE_PROVIDER_SITE_OTHER): Payer: Medicare PPO | Admitting: *Deleted

## 2020-08-01 ENCOUNTER — Other Ambulatory Visit: Payer: Self-pay

## 2020-08-01 DIAGNOSIS — I4891 Unspecified atrial fibrillation: Secondary | ICD-10-CM | POA: Diagnosis not present

## 2020-08-01 DIAGNOSIS — Z5181 Encounter for therapeutic drug level monitoring: Secondary | ICD-10-CM | POA: Diagnosis not present

## 2020-08-01 LAB — POCT INR: INR: 2.7 (ref 2.0–3.0)

## 2020-08-01 NOTE — Patient Instructions (Signed)
Description   Continue taking 1 tablet daily except for 1.5 tablets on Mondays. Recheck INR in 6 weeks. Coumadin Clinic [phone].

## 2020-08-08 ENCOUNTER — Other Ambulatory Visit: Payer: Self-pay | Admitting: Interventional Cardiology

## 2020-08-16 DIAGNOSIS — D485 Neoplasm of uncertain behavior of skin: Secondary | ICD-10-CM | POA: Diagnosis not present

## 2020-08-16 DIAGNOSIS — L720 Epidermal cyst: Secondary | ICD-10-CM | POA: Diagnosis not present

## 2020-08-16 DIAGNOSIS — C44629 Squamous cell carcinoma of skin of left upper limb, including shoulder: Secondary | ICD-10-CM | POA: Diagnosis not present

## 2020-08-16 DIAGNOSIS — Z85828 Personal history of other malignant neoplasm of skin: Secondary | ICD-10-CM | POA: Diagnosis not present

## 2020-08-16 DIAGNOSIS — L570 Actinic keratosis: Secondary | ICD-10-CM | POA: Diagnosis not present

## 2020-08-31 ENCOUNTER — Other Ambulatory Visit: Payer: Self-pay | Admitting: Interventional Cardiology

## 2020-09-03 DIAGNOSIS — R0602 Shortness of breath: Secondary | ICD-10-CM | POA: Diagnosis not present

## 2020-09-03 DIAGNOSIS — I5032 Chronic diastolic (congestive) heart failure: Secondary | ICD-10-CM | POA: Diagnosis not present

## 2020-09-03 DIAGNOSIS — I10 Essential (primary) hypertension: Secondary | ICD-10-CM | POA: Diagnosis not present

## 2020-09-03 DIAGNOSIS — J449 Chronic obstructive pulmonary disease, unspecified: Secondary | ICD-10-CM | POA: Diagnosis not present

## 2020-09-03 DIAGNOSIS — N390 Urinary tract infection, site not specified: Secondary | ICD-10-CM | POA: Diagnosis not present

## 2020-09-04 ENCOUNTER — Telehealth: Payer: Self-pay | Admitting: Interventional Cardiology

## 2020-09-04 ENCOUNTER — Other Ambulatory Visit: Payer: Self-pay

## 2020-09-04 ENCOUNTER — Ambulatory Visit
Admission: RE | Admit: 2020-09-04 | Discharge: 2020-09-04 | Disposition: A | Discharge status: Home / Self Care | Payer: Medicare PPO | Source: Ambulatory Visit | Attending: Family Medicine | Admitting: Family Medicine

## 2020-09-04 ENCOUNTER — Other Ambulatory Visit: Payer: Self-pay | Admitting: Family Medicine

## 2020-09-04 DIAGNOSIS — R0602 Shortness of breath: Secondary | ICD-10-CM | POA: Diagnosis not present

## 2020-09-04 DIAGNOSIS — I5042 Chronic combined systolic (congestive) and diastolic (congestive) heart failure: Secondary | ICD-10-CM

## 2020-09-04 NOTE — Telephone Encounter (Signed)
New Message:     Patient's daughter is calling. SH e said she would like for the pt to see somebody today please. She is having shortness of breath     Pt c/o Shortness Of Breath: STAT if SOB developed within the last 24 hours or pt is noticeably SOB on the phone  1. Are you currently SOB (can you hear that pt is SOB on the phone)? yes  Not at this time, only with exertion 2. How long have you been experiencing SOB? yesterday  3. Are you SOB when sitting or when up moving around? Both, more when moving around  4. Are you currently experiencing any other symptoms? Crackling in her lungs, weakness

## 2020-09-04 NOTE — Telephone Encounter (Signed)
Lasix 80 mg was given 3 times yesterday?  There is a lot going on here. Needs to be seen. Needs blood work as already planned.  ER if worsens.

## 2020-09-04 NOTE — Telephone Encounter (Signed)
Spoke with [name].  Pt seen PCP yesterday because they were concerned she may have a UTI due to altered mental status.  Pt also having SOB and weakness.  [name] states PCP mentioned crackles in her lungs so she is headed to have a Chest Xray now.  Daughter states pt has a slight, intermittent cough, more like she's trying to clear her throat.  Denies drainage or congestion.  PCP advised to take extra Furosemide but this did not help.  Denies swelling but states pt normally doesn't swell with CHF exacerbation.  States vitals were a little low for pt at appt (1116/??).  O2 at PCP was 96% but they are getting 91% at home.  Scheduled pt to come in first available with DOD on Thursday.  Advised to call with CXR results and have them fax a copy.  If we discover this is pneumonia, we can cancel appt at our office but if felt to be CHF, we can still see her and adjust meds as needed. Daughter verbalized understanding and was in agreement with plan.

## 2020-09-04 NOTE — Telephone Encounter (Signed)
CXR received and showed small bilateral pleural effusions.  Spoke with Dr. Johney Frame, DOD and she said to have pt take Furosemide '80mg'$  AM and '40mg'$  PM x 5 days and keep plan to see Dr. Irish Lack on Thursday.  She advised to have BMET drawn as well.  Spoke with [name] and made her aware of recommendations.  She did mention to me that she gave Furosemide '80mg'$  x 3 yesterday.  Advised we will go ahead and draw BMET when they are here on Thursday. Daughter verbalized understanding and was in agreement with plan.

## 2020-09-05 NOTE — Progress Notes (Signed)
Cardiology Office Note   Date:  09/06/2020   ID:  Amber, [name] 07-Oct-1934, MRN [id number]  PCP:  Antony Contras, MD    No chief complaint on file.  Shortness of breath  Wt Readings from Last 3 Encounters:  09/06/20 137 lb (62.1 kg)  04/18/20 142 lb (64.4 kg)  04/10/20 138 lb 8 oz (62.8 kg)       History of Present Illness: Amber [name] is a 86 y.o. female  with a hx of  coronary artery disease with prior CABG in 2000, chronic atrial fibrillation, chronic diastolic heart failure, chronic kidney disease, stage III, hyperlipidemia (statin intolerant), and essential hypertension with hypertensive heart disease, cerebrovascular disease, and dementia.     Seen by Dr. Tamala Julian in February 2022.  Records show: "Accompanied by her daughter who does most of the talking.  Patient states she is stable since last being seen.  Daughter feels that she is doing well and does not want to rock the boat with any changes.   Patient sleeps in a chair.  She has dyspnea on exertion.  She denies angina.  She is not having lower extremity edema.  No bleeding on the current medication regimen."  Echo in 2021 showed: "2D Doppler echocardiogram August 2021: IMPRESSIONS   1. The mid anteroseptal akinesis is new compared to the previous ECho  01/02/17.            [name] Left ventricular ejection fraction, by estimation, is 35 to 40%. The  left ventricle has moderately decreased function. The left ventricle  demonstrates regional wall motion abnormalities (see scoring  diagram/findings for description). Left  ventricular diastolic parameters were normal. There is severe akinesis of  the left ventricular, basal-mid anteroseptal wall.   2. Right ventricular systolic function is normal. The right ventricular  size is normal. There is mildly elevated pulmonary artery systolic  pressure.   3. Left atrial size was mildly dilated.   4. The mitral valve is grossly normal. Mild mitral valve regurgitation.  No  evidence of mitral stenosis.   5. The aortic valve is tricuspid. Aortic valve regurgitation is trivial.  Mild to moderate aortic valve stenosis."  On Monday morning, 3 days ago, she had shortness of breath and increased wrk of breathing.  She was more confused as well.  No cough and O2 sats wer down to 91%.  Family suspected  UTI due to the confusion.    CXR on 7/26 showed: "Small bilateral pleural effusions, left greater than right with bibasilar atelectasis. Slightly increased since prior study."  Lasix was increased to 80 mg PO in AM, 40 mg PM.    She was very weak.  Breathing has improved since the increase in her medicine.    Past Medical History:  Diagnosis Date   Abnormality of gait 08/02/2014   Cerebrovascular disease 05/21/2016   Cervical spinal stenosis    Chronic atrial fibrillation (HCC)    a. chronic/rate controlled;  b. chronic coumadin.   Chronic diastolic CHF (congestive heart failure) ([facility])    a. 02/2013 Echo: EF 55-60%, mild AI, mod dil LA.   CKD (chronic kidney disease), stage III (HCC)    COPD with emphysema (HCC)    PFT 05/02/10>>FEV1 1.35(62%), FEV1% 66, DLCO 75%   Coronary artery disease    a. s/p CABG;  b. Abnormal nuc 2015 - managed medically.   CVA (cerebral vascular accident) ([facility])    left sided weakness   Depression    Fibromyalgia  GERD (gastroesophageal reflux disease)    pepcid 2-3 times per week   Gout    Hemiparesis and alteration of sensations as late effects of stroke ([place]) 01/17/2015   History of melanoma    squamous cell, melanoma   Hyperlipidemia    a. statin intolerant, not felt to be candidate for PCSK9 due to chronically elevated CK levels.   Hypertension    Hypertensive heart disease    Hypothyroidism    Insomnia    Memory change 01/23/2014   Nasal polyposis    Osteoarthritis    Osteoporosis    Pneumonia    1990   Tobacco abuse     Past Surgical History:  Procedure Laterality Date   ABDOMINAL HYSTERECTOMY     BREAST  LUMPECTOMY  1980s   Benign lesion - right   carpel tunnel     right   CORONARY ARTERY BYPASS GRAFT  2000   EYE SURGERY     bilateral cataracts   IR GENERIC HISTORICAL  03/31/2016   IR RADIOLOGIST EVAL & MGMT 03/31/2016 [facility]   RADIOLOGY WITH ANESTHESIA  12/24/2011   Procedure: RADIOLOGY WITH ANESTHESIA;  Surgeon: Medication Radiologist, MD;  Location: [facility];  Service: Radiology;  Laterality: N/A;  Extra Cranial Vascular Stent   RADIOLOGY WITH ANESTHESIA N/A 07/19/2014   Procedure: ANGIOPLASTY;  Surgeon: Luanne Bras, MD;  Location: [facility];  Service: Radiology;  Laterality: N/A;   RADIOLOGY WITH ANESTHESIA N/A 07/27/2014   Procedure: ANGIOPLASTY;  Surgeon: Luanne Bras, MD;  Location: [facility];  Service: Radiology;  Laterality: N/A;   TONSILLECTOMY       Current Outpatient Medications  Medication Sig Dispense Refill   allopurinol (ZYLOPRIM) 300 MG tablet Take 300 mg by mouth at bedtime.      amLODipine (NORVASC) 5 MG tablet TAKE 1 TABLET BY MOUTH EVERY DAY 90 tablet 2   citalopram (CELEXA) 20 MG tablet TAKE 1 TABLET BY MOUTH TWICE A DAY 60 tablet 11   clopidogrel (PLAVIX) 75 MG tablet TAKE 1 TABLET (75 MG TOTAL) BY MOUTH DAILY. 90 tablet 3   diphenoxylate-atropine (LOMOTIL) 2.5-0.025 MG tablet Take 1 tablet by mouth every Monday, Wednesday, and Friday.     donepezil (ARICEPT) 5 MG tablet Take 1 tablet (5 mg total) by mouth daily. 90 tablet 3   ergocalciferol (VITAMIN D2) 50000 UNITS capsule Take 50,000 Units by mouth 2 (two) times a week. Tuesday and Friday     fluticasone (FLONASE) 50 MCG/ACT nasal spray Place 1 spray into both nostrils daily.      furosemide (LASIX) 80 MG tablet Take 1 tablet (80 mg total) by mouth daily. 90 tablet 3   ipratropium-albuterol (DUONEB) 0.5-2.5 (3) MG/3ML SOLN Take 3 mLs by nebulization every 4 (four) hours as needed (Use at home). 360 mL 0   levothyroxine (SYNTHROID, LEVOTHROID) 25 MCG tablet Take 25 mcg by mouth every morning.      losartan  (COZAAR) 25 MG tablet TAKE 1 TABLET BY MOUTH EVERY DAY 90 tablet 3   memantine (NAMENDA) 10 MG tablet Take 1 tablet (10 mg total) by mouth 2 (two) times daily. 180 tablet 3   metoprolol succinate (TOPROL-XL) 25 MG 24 hr tablet TAKE 1 TABLET BY MOUTH EVERY DAY 90 tablet 3   nitroGLYCERIN (NITROSTAT) 0.4 MG SL tablet Place 1 tablet (0.4 mg total) under the tongue every 5 (five) minutes as needed for chest pain. 25 tablet 3   potassium chloride SA (KLOR-CON M20) 20 MEQ tablet TAKE 1 TABLET  EVERY OTHER DAY, ALTERNATING WITH 2 TABLETS EVERY OTHER DAY 135 tablet 2   warfarin (COUMADIN) 5 MG tablet TAKE AS DIRECTED BY COUMADIN CLINIC 100 tablet 1   ZINC-VITAMIN C PO Take 1 tablet by mouth every evening.     No current facility-administered medications for this visit.    Allergies:   Ezetimibe, Welchol [colesevelam hcl], Adhesive [tape], Ceclor [cefaclor], Elastic bandages & [zinc], Latex, Lipitor [atorvastatin calcium], Mevacor [lovastatin], Pravachol, and Vasotec    Social History:  The patient  reports that she quit smoking about 43 years ago. Her smoking use included cigarettes. She has a 10.00 pack-year smoking history. She has never used smokeless tobacco. She reports that she does not drink alcohol and does not use drugs.   Family History:  The patient's family history includes CAD in her brother and father; Diabetes type II in her brother; Stroke in her mother.    ROS:  Please see the history of present illness.   Otherwise, review of systems are positive for increase shortness of breath.   All other systems are reviewed and negative.    PHYSICAL EXAM: VS:  BP 122/60   Pulse 75   Ht '5\' 5"'$  (1.651 m)   Wt 137 lb (62.1 kg)   SpO2 98%   BMI 22.80 kg/m  , BMI Body mass index is 22.8 kg/m. GEN: Well nourished, well developed, in no acute distress HEENT: normal Neck: no JVD, carotid bruits, or masses Cardiac: irregularly irregular; 3/6 systolic murmurs, no rubs, or gallops,no ankle edema   Respiratory:  clear to auscultation bilaterally, normal work of breathing GI: soft, nontender, nondistended, + BS MS: no deformity or atrophy Skin: warm and dry, no rash Neuro:  Strength and sensation are intact; brace on left leg- using walker Psych: euthymic mood, full affect   EKG:   The ekg ordered today demonstrates AFib, rate controlled   Recent Labs: 04/06/2020: BUN 13; Creatinine, Ser 1.00; Potassium 4.2; Sodium 145   Lipid Panel    Component Value Date/Time   CHOL 228 (H) 03/06/2017 0410   TRIG 34 03/06/2017 0410   HDL 62 03/06/2017 0410   CHOLHDL 3.7 03/06/2017 0410   VLDL 7 03/06/2017 0410   LDLCALC 159 (H) 03/06/2017 0410   LDLDIRECT 236.0 08/02/2014 1418     Other studies Reviewed: Additional studies/ records that were reviewed today with results demonstrating: Normal creatinine and potassium in February 2022.   ASSESSMENT AND PLAN:  CAD: Increase shortness of breath of late.  Check BNP and bmet today.  No angina.  She has dementia and this limits her memory of the events of the earlier in the week. Atrial fibrillation: Coumadin for stroke prevention.  On Cipro now so will check INR.  Anticoagulated: No bleeding problems.  INR today. Acute on chronic systolic heart failure: Breathing improved since Monday. COntinue diuretics as planned by Dr. Tamala Julian.  CHeck labs today.  Aortic stenosis: No passing out.  Mild to moderate in 2021.  The patient and family are not interested in any advanced management of her aortic stenosis such as TAVR, due to her dementia. SHe does not want to go to the hospital.  She will be having home hospice visit.  It may be reasonable to have a BMet every 2-4 weeks as they see her.      Current medicines are reviewed at length with the patient today.  The patient concerns regarding her medicines were addressed.  The following changes have been made:  No change  Labs/ tests ordered today include:  No orders of the defined types were  placed in this encounter.   Recommend 150 minutes/week of aerobic exercise Low fat, low carb, high fiber diet recommended  Disposition:   FU with Dr. Tamala Julian   Signed, Larae Grooms, MD  09/06/2020 10:02 AM    [address], [place], [place]  [address] Phone: [phone]; Fax: [phone]

## 2020-09-06 ENCOUNTER — Other Ambulatory Visit: Payer: Self-pay

## 2020-09-06 ENCOUNTER — Ambulatory Visit (INDEPENDENT_AMBULATORY_CARE_PROVIDER_SITE_OTHER): Admitting: Interventional Cardiology

## 2020-09-06 ENCOUNTER — Encounter: Payer: Self-pay | Admitting: Interventional Cardiology

## 2020-09-06 ENCOUNTER — Ambulatory Visit (INDEPENDENT_AMBULATORY_CARE_PROVIDER_SITE_OTHER): Payer: Medicare PPO

## 2020-09-06 ENCOUNTER — Other Ambulatory Visit: Payer: Medicare PPO | Admitting: *Deleted

## 2020-09-06 VITALS — BP 122/60 | HR 75 | Ht 65.0 in | Wt 137.0 lb

## 2020-09-06 DIAGNOSIS — Z7901 Long term (current) use of anticoagulants: Secondary | ICD-10-CM

## 2020-09-06 DIAGNOSIS — Z5181 Encounter for therapeutic drug level monitoring: Secondary | ICD-10-CM

## 2020-09-06 DIAGNOSIS — I4891 Unspecified atrial fibrillation: Secondary | ICD-10-CM | POA: Diagnosis not present

## 2020-09-06 DIAGNOSIS — I25709 Atherosclerosis of coronary artery bypass graft(s), unspecified, with unspecified angina pectoris: Secondary | ICD-10-CM

## 2020-09-06 DIAGNOSIS — I350 Nonrheumatic aortic (valve) stenosis: Secondary | ICD-10-CM | POA: Diagnosis not present

## 2020-09-06 DIAGNOSIS — I5023 Acute on chronic systolic (congestive) heart failure: Secondary | ICD-10-CM

## 2020-09-06 DIAGNOSIS — F0390 Unspecified dementia without behavioral disturbance: Secondary | ICD-10-CM | POA: Diagnosis not present

## 2020-09-06 DIAGNOSIS — I5042 Chronic combined systolic (congestive) and diastolic (congestive) heart failure: Secondary | ICD-10-CM | POA: Diagnosis not present

## 2020-09-06 DIAGNOSIS — R0602 Shortness of breath: Secondary | ICD-10-CM

## 2020-09-06 LAB — POCT INR: INR: 3 (ref 2.0–3.0)

## 2020-09-06 NOTE — Patient Instructions (Addendum)
Description   Take 1/2 tablet tonight and eat some greens then continue taking 1 tablet daily except for 1.5 tablets on Mondays. Recheck INR in 1 week by [facility] [employer].  [facility] [phone].

## 2020-09-06 NOTE — Patient Instructions (Signed)
Medication Instructions:  Your physician recommends that you continue on your current medications as directed. Please refer to the Current Medication list given to you today.  *If you need a refill on your cardiac medications before your next appointment, please call your pharmacy*   Lab Work: none If you have labs (blood work) drawn today and your tests are completely normal, you will receive your results only by: [facility] (if you have MyChart) OR A paper copy in the mail If you have any lab test that is abnormal or we need to change your treatment, we will call you to review the results.   Testing/Procedures: Lab work to be done today--BNP and BMP   Follow-Up: At [facility], you and your health needs are our priority.  As part of our continuing mission to provide you with exceptional heart care, we have created designated Provider Care Teams.  These Care Teams include your primary Cardiologist (physician) and Advanced Practice Providers (APPs -  Physician Assistants and Nurse Practitioners) who all work together to provide you with the care you need, when you need it.  We recommend signing up for the patient portal called "MyChart".  Sign up information is provided on this After Visit Summary.  MyChart is used to connect with patients for Virtual Visits (Telemedicine).  Patients are able to view lab/test results, encounter notes, upcoming appointments, etc.  Non-urgent messages can be sent to your provider as well.   To learn more about what you can do with MyChart, go to [email or web address].    Your next appointment:   As planned in August  The format for your next appointment:   In Person  Provider:   Daneen Schick, MD   Other Instructions

## 2020-09-07 LAB — BASIC METABOLIC PANEL
BUN/Creatinine Ratio: 16 (ref 12–28)
BUN: 15 mg/dL (ref 8–27)
CO2: 27 mmol/L (ref 20–29)
Calcium: 9.2 mg/dL (ref 8.7–10.3)
Chloride: 100 mmol/L (ref 96–106)
Creatinine, Ser: 0.91 mg/dL (ref 0.57–1.00)
Glucose: 95 mg/dL (ref 65–99)
Potassium: 4.1 mmol/L (ref 3.5–5.2)
Sodium: 143 mmol/L (ref 134–144)
eGFR: 63 mL/min/{1.73_m2} (ref >59)

## 2020-09-07 LAB — PRO B NATRIURETIC PEPTIDE: NT-Pro BNP: 5862 pg/mL — ABNORMAL HIGH (ref 0–738)

## 2020-09-12 ENCOUNTER — Ambulatory Visit (INDEPENDENT_AMBULATORY_CARE_PROVIDER_SITE_OTHER)

## 2020-09-12 DIAGNOSIS — Z5181 Encounter for therapeutic drug level monitoring: Secondary | ICD-10-CM

## 2020-09-12 DIAGNOSIS — I4891 Unspecified atrial fibrillation: Secondary | ICD-10-CM

## 2020-09-12 LAB — POCT INR: INR: 2.4 (ref 2.0–3.0)

## 2020-09-12 NOTE — Patient Instructions (Signed)
Description   Spoke with Izora Gala with [facility] and instructed to continue taking 1 tablet daily except for 1.5 tablets on Mondays. Recheck INR in 1 week by [facility] [employer].  [facility] [phone].

## 2020-09-17 ENCOUNTER — Other Ambulatory Visit: Payer: Self-pay | Admitting: Interventional Cardiology

## 2020-09-18 ENCOUNTER — Other Ambulatory Visit: Payer: Self-pay | Admitting: *Deleted

## 2020-09-18 MED ORDER — LOSARTAN POTASSIUM 25 MG PO TABS: 25.0000 mg | ORAL_TABLET | Freq: Every day | ORAL | 3 refills | Status: AC

## 2020-09-19 ENCOUNTER — Telehealth: Payer: Self-pay

## 2020-09-19 LAB — POCT INR: INR: 2.4 (ref 2.0–3.0)

## 2020-09-19 NOTE — Telephone Encounter (Signed)
Called Izora Gala with [facility] for pt's INR, no answer.

## 2020-09-20 ENCOUNTER — Ambulatory Visit (INDEPENDENT_AMBULATORY_CARE_PROVIDER_SITE_OTHER): Payer: Medicare PPO

## 2020-09-20 DIAGNOSIS — Z5181 Encounter for therapeutic drug level monitoring: Secondary | ICD-10-CM | POA: Diagnosis not present

## 2020-09-20 DIAGNOSIS — I4891 Unspecified atrial fibrillation: Secondary | ICD-10-CM

## 2020-09-20 NOTE — Patient Instructions (Signed)
Description   Spoke with Thomes Dinning with [facility] and pt's daughter and instructed to continue taking 1 tablet daily except for 1.5 tablets on Mondays. Recheck INR in 2 weeks by [employer].  Coumadin Clinic [phone].

## 2020-10-03 ENCOUNTER — Ambulatory Visit (INDEPENDENT_AMBULATORY_CARE_PROVIDER_SITE_OTHER): Admitting: *Deleted

## 2020-10-03 DIAGNOSIS — Z5181 Encounter for therapeutic drug level monitoring: Secondary | ICD-10-CM

## 2020-10-03 DIAGNOSIS — I4891 Unspecified atrial fibrillation: Secondary | ICD-10-CM

## 2020-10-03 LAB — POCT INR: INR: 2.4 (ref 2.0–3.0)

## 2020-10-03 NOTE — Patient Instructions (Signed)
Description   Spoke with Caryl Pina RN with [facility] and instructed to continue taking 1 tablet daily except for 1.5 tablets on Mondays. Recheck INR in 3 weeks by [employer].  Coumadin Clinic [phone].

## 2020-10-04 NOTE — Progress Notes (Signed)
Cardiology Office Note:    Date:  10/05/2020   ID:  Amber [name], DOB 06-08-1934, MRN [id number]  PCP:  Antony Contras, MD  Cardiologist:  Sinclair Grooms, MD   Referring MD: Antony Contras, MD   No chief complaint on file.   History of Present Illness:    Amber [name] is a 86 y.o. female with a hx of coronary artery disease with prior CABG in 2000, chronic atrial fibrillation, chronic diastolic heart failure, chronic kidney disease, stage III, hyperlipidemia (statin intolerant), and essential hypertension with hypertensive heart disease, cerebrovascular disease, and dementia.      The patient is doing okay.  She is accompanied by another one of her daughters today.  This is not the nurse.  She did have heart failure back in July.  We transiently increased the Lasix dose and brought about improvement in her breathing and overall feeling.  The episode of failure was precipitated by UTI.  This is also resolved.  Currently not having chest pain, orthopnea, PND, or swelling.  Past Medical History:  Diagnosis Date   Abnormality of gait 08/02/2014   Cerebrovascular disease 05/21/2016   Cervical spinal stenosis    Chronic atrial fibrillation (HCC)    a. chronic/rate controlled;  b. chronic coumadin.   Chronic diastolic CHF (congestive heart failure) ([place])    a. 02/2013 Echo: EF 55-60%, mild AI, mod dil LA.   CKD (chronic kidney disease), stage III (HCC)    COPD with emphysema (HCC)    PFT 05/02/10>>FEV1 1.35(62%), FEV1% 66, DLCO 75%   Coronary artery disease    a. s/p CABG;  b. Abnormal nuc 2015 - managed medically.   CVA (cerebral vascular accident) ([place])    left sided weakness   Depression    Fibromyalgia    GERD (gastroesophageal reflux disease)    pepcid 2-3 times per week   Gout    Hemiparesis and alteration of sensations as late effects of stroke ([place]) 01/17/2015   History of melanoma    squamous cell, melanoma   Hyperlipidemia    a. statin intolerant, not felt to be  candidate for PCSK9 due to chronically elevated CK levels.   Hypertension    Hypertensive heart disease    Hypothyroidism    Insomnia    Memory change 01/23/2014   Nasal polyposis    Osteoarthritis    Osteoporosis    Pneumonia    1990   Tobacco abuse     Past Surgical History:  Procedure Laterality Date   ABDOMINAL HYSTERECTOMY     BREAST LUMPECTOMY  1980s   Benign lesion - right   carpel tunnel     right   CORONARY ARTERY BYPASS GRAFT  2000   EYE SURGERY     bilateral cataracts   IR GENERIC HISTORICAL  03/31/2016   IR RADIOLOGIST EVAL & MGMT 03/31/2016 [facility]INTER[facility] RAD   RADIOLOGY WITH ANESTHESIA  12/24/2011   Procedure: RADIOLOGY WITH ANESTHESIA;  Surgeon: Medication Radiologist, MD;  Location: [place];  Service: Radiology;  Laterality: N/A;  Extra Cranial Vascular Stent   RADIOLOGY WITH ANESTHESIA N/A 07/19/2014   Procedure: ANGIOPLASTY;  Surgeon: Luanne Bras, MD;  Location: [place];  Service: Radiology;  Laterality: N/A;   RADIOLOGY WITH ANESTHESIA N/A 07/27/2014   Procedure: ANGIOPLASTY;  Surgeon: Luanne Bras, MD;  Location: [place];  Service: Radiology;  Laterality: N/A;   TONSILLECTOMY      Current Medications: Current Meds  Medication Sig   allopurinol (ZYLOPRIM) 300 MG  tablet Take 300 mg by mouth at bedtime.    amLODipine (NORVASC) 5 MG tablet TAKE 1 TABLET BY MOUTH EVERY DAY   citalopram (CELEXA) 20 MG tablet TAKE 1 TABLET BY MOUTH TWICE A DAY   clopidogrel (PLAVIX) 75 MG tablet TAKE 1 TABLET (75 MG TOTAL) BY MOUTH DAILY.   diphenoxylate-atropine (LOMOTIL) 2.5-0.025 MG tablet Take 1 tablet by mouth every Monday, Wednesday, and Friday.   donepezil (ARICEPT) 5 MG tablet Take 1 tablet (5 mg total) by mouth daily.   ergocalciferol (VITAMIN D2) 50000 UNITS capsule Take 50,000 Units by mouth 2 (two) times a week. Tuesday and Friday   fluticasone (FLONASE) 50 MCG/ACT nasal spray Place 1 spray into both nostrils daily.    furosemide (LASIX) 80 MG tablet Take 1 tablet (80  mg total) by mouth daily.   ipratropium-albuterol (DUONEB) 0.5-2.5 (3) MG/3ML SOLN Take 3 mLs by nebulization every 4 (four) hours as needed (Use at home).   levothyroxine (SYNTHROID, LEVOTHROID) 25 MCG tablet Take 25 mcg by mouth every morning.    losartan (COZAAR) 25 MG tablet Take 1 tablet (25 mg total) by mouth daily.   memantine (NAMENDA) 10 MG tablet Take 1 tablet (10 mg total) by mouth 2 (two) times daily.   metoprolol succinate (TOPROL-XL) 25 MG 24 hr tablet TAKE 1 TABLET BY MOUTH EVERY DAY   nitroGLYCERIN (NITROSTAT) 0.4 MG SL tablet Place 1 tablet (0.4 mg total) under the tongue every 5 (five) minutes as needed for chest pain.   potassium chloride SA (KLOR-CON M20) 20 MEQ tablet TAKE 1 TABLET EVERY OTHER DAY, ALTERNATING WITH 2 TABLETS EVERY OTHER DAY   warfarin (COUMADIN) 5 MG tablet TAKE AS DIRECTED BY COUMADIN CLINIC   ZINC-VITAMIN C PO Take 1 tablet by mouth every evening.     Allergies:   Ezetimibe, Welchol [colesevelam hcl], Adhesive [tape], Ceclor [cefaclor], Elastic bandages & [zinc], Latex, Lipitor [atorvastatin calcium], Mevacor [lovastatin], Pravachol, and Vasotec   Social History   Socioeconomic History   Marital status: Married    Spouse name: Not on file   Number of children: 2   Years of education: Not on file   Highest education level: Not on file  Occupational History   Occupation: Retired    Fish farm manager: Psychologist, sport and exercise [employer]   Occupation: Network engineer    Comment: Retired  Tobacco Use   Smoking status: Former    Packs/day: 0.40    Years: 25.00    Pack years: 10.00    Types: Cigarettes    Quit date: 02/10/1977    Years since quitting: 43.6   Smokeless tobacco: Never  Vaping Use   Vaping Use: Never used  Substance and Sexual Activity   Alcohol use: No   Drug use: No   Sexual activity: Not Currently  Other Topics Concern   Not on file  Social History Narrative   Patient is right handed.   Patient lives at home with husband   Patient drinks 8 oz caffeine  occasionally.   Social Determinants of Health   Financial Resource Strain: Not on file  Food Insecurity: Not on file  Transportation Needs: Not on file  Physical Activity: Not on file  Stress: Not on file  Social Connections: Not on file     Family History: The patient's family history includes CAD in her brother and father; Diabetes type II in her brother; Stroke in her mother.  ROS:   Please see the history of present illness.    Her daughter does much  of the speaking for her.  She still lives independently but with the oversight of her daughters and other friends and family.  All other systems reviewed and are negative.  EKGs/Labs/Other Studies Reviewed:    The following studies were reviewed today:  2D Doppler echocardiogram September 14, 2019: IMPRESSIONS     1. The mid anteroseptal akinesis is new compared to the previous ECho  01/02/17. Left ventricular ejection fraction, by estimation, is 35 to 40%. The  left ventricle has moderately decreased function. The left ventricle  demonstrates regional wall motion abnormalities (see scoring  diagram/findings for description). Left  ventricular diastolic parameters were normal. There is severe akinesis of  the left ventricular, basal-mid anteroseptal wall.   2. Right ventricular systolic function is normal. The right ventricular  size is normal. There is mildly elevated pulmonary artery systolic  pressure.   3. Left atrial size was mildly dilated.   4. The mitral valve is grossly normal. Mild mitral valve regurgitation.  No evidence of mitral stenosis.   5. The aortic valve is tricuspid. Aortic valve regurgitation is trivial.  Mild to moderate aortic valve stenosis.   PA and lateral chest x-ray September 04, 2020 IMPRESSION: Small bilateral pleural effusions, left greater than right with bibasilar atelectasis. Slightly increased since prior study.   EKG:  EKG September 06, 2020 EKG demonstrates fibrillation with controlled rate and  no change compared to prior.  Recent Labs: 09/06/2020: BUN 15; Creatinine, Ser 0.91; NT-Pro BNP 5,862; Potassium 4.1; Sodium 143  Recent Lipid Panel    Component Value Date/Time   CHOL 228 (H) 03/06/2017 0410   TRIG 34 03/06/2017 0410   HDL 62 03/06/2017 0410   CHOLHDL 3.7 03/06/2017 0410   VLDL 7 03/06/2017 0410   LDLCALC 159 (H) 03/06/2017 0410   LDLDIRECT 236.0 08/02/2014 1418    Physical Exam:    VS:  BP 131/61   Pulse 88   Ht '5\' 5"'$  (1.651 m)   Wt 140 lb (63.5 kg)   SpO2 95%   BMI 23.30 kg/m     Wt Readings from Last 3 Encounters:  10/05/20 140 lb (63.5 kg)  09/06/20 137 lb (62.1 kg)  04/18/20 142 lb (64.4 kg)     GEN: Compatible with age in appearance. No acute distress HEENT: Normal NECK: No JVD. LYMPHATICS: No lymphadenopathy CARDIAC: 3/6 coarse aortic stenosis murmur. IIRR no gallop, or edema. VASCULAR:  Normal Pulses. No bruits. RESPIRATORY:  Clear to auscultation without rales, wheezing or rhonchi  ABDOMEN: Soft, non-tender, non-distended, No pulsatile mass, MUSCULOSKELETAL: No deformity  SKIN: Warm and dry NEUROLOGIC:  Alert and oriented x 3 PSYCHIATRIC:  Normal affect   ASSESSMENT:    1. Chronic combined systolic and diastolic heart failure ([place])   2. Atrial fibrillation, unspecified type ([place])   3. Long term current use of anticoagulant therapy   4. Coronary artery disease involving coronary bypass graft of native heart with angina pectoris ([place])   5. Essential hypertension   6. Dementia without behavioral disturbance, unspecified dementia type ([place])   7. Hemiparesis and alteration of sensations as late effects of stroke (HCC)    PLAN:    In order of problems listed above:  Continue diuretic therapy with furosemide 80 mg/day, Cozaar 25 mg/day, Toprol-XL 25 mg every day, and K. Dur.  Weigh daily.  Notify us if increasing shortness of breath or weight gain. Continue metoprolol therapy for rate control.  Continue Coumadin therapy. Continue to  monitor INR Notify us if angina.  Continue Plavix. Norvasc, furosemide, Toprol, and Cozaar will be continued. We did not explore this. Not assessed.  Guideline directed therapy for left ventricular systolic dysfunction: Angiotensin receptor-neprilysin inhibitor (ARNI)-Entresto; beta-blocker therapy - carvedilol, metoprolol succinate, or bisoprolol; mineralocorticoid receptor antagonist (MRA) therapy -spironolactone or eplerenone.  SGLT-2 agents -  Dapagliflozin [place]) or Empagliflozin ([place]).These therapies have been shown to improve clinical outcomes including reduction of rehospitalization, survival, and acute heart failure.    Medication Adjustments/Labs and Tests Ordered: Current medicines are reviewed at length with the patient today.  Concerns regarding medicines are outlined above.  No orders of the defined types were placed in this encounter.  No orders of the defined types were placed in this encounter.   Patient Instructions  Medication Instructions:  Your physician recommends that you continue on your current medications as directed. Please refer to the Current Medication list given to you today.  *If you need a refill on your cardiac medications before your next appointment, please call your pharmacy*   Lab Work: None If you have labs (blood work) drawn today and your tests are completely normal, you will receive your results only by: [place] (if you have MyChart) OR A paper copy in the mail If you have any lab test that is abnormal or we need to change your treatment, we will call you to review the results.   Testing/Procedures: None   Follow-Up: At [facility], you and your health needs are our priority.  As part of our continuing mission to provide you with exceptional heart care, we have created designated Provider Care Teams.  These Care Teams include your primary Cardiologist (physician) and Advanced Practice Providers (APPs -  Physician  Assistants and Nurse Practitioners) who all work together to provide you with the care you need, when you need it.  We recommend signing up for the patient portal called "MyChart".  Sign up information is provided on this After Visit Summary.  MyChart is used to connect with patients for Virtual Visits (Telemedicine).  Patients are able to view lab/test results, encounter notes, upcoming appointments, etc.  Non-urgent messages can be sent to your provider as well.   To learn more about what you can do with MyChart, go to [email or web address].    Your next appointment:   6 month(s)  The format for your next appointment:   In Person  Provider:   You may see Sinclair Grooms, MD or one of the following Advanced Practice Providers on your designated Care Team:   Cecilie Kicks, NP   Other Instructions     Signed, Sinclair Grooms, MD  10/05/2020 3:59 PM    [place]

## 2020-10-05 ENCOUNTER — Ambulatory Visit: Admitting: Interventional Cardiology

## 2020-10-05 ENCOUNTER — Encounter: Payer: Self-pay | Admitting: Interventional Cardiology

## 2020-10-05 ENCOUNTER — Other Ambulatory Visit: Payer: Self-pay

## 2020-10-05 VITALS — BP 131/61 | HR 88 | Ht 65.0 in | Wt 140.0 lb

## 2020-10-05 DIAGNOSIS — I4891 Unspecified atrial fibrillation: Secondary | ICD-10-CM | POA: Diagnosis not present

## 2020-10-05 DIAGNOSIS — I5042 Chronic combined systolic (congestive) and diastolic (congestive) heart failure: Secondary | ICD-10-CM | POA: Diagnosis not present

## 2020-10-05 DIAGNOSIS — I69398 Other sequelae of cerebral infarction: Secondary | ICD-10-CM

## 2020-10-05 DIAGNOSIS — I25709 Atherosclerosis of coronary artery bypass graft(s), unspecified, with unspecified angina pectoris: Secondary | ICD-10-CM

## 2020-10-05 DIAGNOSIS — I69359 Hemiplegia and hemiparesis following cerebral infarction affecting unspecified side: Secondary | ICD-10-CM

## 2020-10-05 DIAGNOSIS — I10 Essential (primary) hypertension: Secondary | ICD-10-CM

## 2020-10-05 DIAGNOSIS — Z7901 Long term (current) use of anticoagulants: Secondary | ICD-10-CM

## 2020-10-05 DIAGNOSIS — F0390 Unspecified dementia without behavioral disturbance: Secondary | ICD-10-CM

## 2020-10-05 NOTE — Patient Instructions (Signed)
Medication Instructions:  Your physician recommends that you continue on your current medications as directed. Please refer to the Current Medication list given to you today.  *If you need a refill on your cardiac medications before your next appointment, please call your pharmacy*   Lab Work: None If you have labs (blood work) drawn today and your tests are completely normal, you will receive your results only by: MyChart Message (if you have MyChart) OR A paper copy in the mail If you have any lab test that is abnormal or we need to change your treatment, we will call you to review the results.   Testing/Procedures: None   Follow-Up: At [facility], you and your health needs are our priority.  As part of our continuing mission to provide you with exceptional heart care, we have created designated Provider Care Teams.  These Care Teams include your primary Cardiologist (physician) and Advanced Practice Providers (APPs -  Physician Assistants and Nurse Practitioners) who all work together to provide you with the care you need, when you need it.  We recommend signing up for the patient portal called "MyChart".  Sign up information is provided on this After Visit Summary.  MyChart is used to connect with patients for Virtual Visits (Telemedicine).  Patients are able to view lab/test results, encounter notes, upcoming appointments, etc.  Non-urgent messages can be sent to your provider as well.   To learn more about what you can do with MyChart, go to [email or web address].    Your next appointment:   6 month(s)  The format for your next appointment:   In Person  Provider:   You may see Henry W Smith III, MD or one of the following Advanced Practice Providers on your designated Care Team:   Laura Ingold, NP   Other Instructions   

## 2020-10-23 ENCOUNTER — Ambulatory Visit (INDEPENDENT_AMBULATORY_CARE_PROVIDER_SITE_OTHER): Admitting: Cardiology

## 2020-10-23 DIAGNOSIS — Z5181 Encounter for therapeutic drug level monitoring: Secondary | ICD-10-CM | POA: Diagnosis not present

## 2020-10-23 LAB — POCT INR: INR: 2.4 (ref 2.0–3.0)

## 2020-10-23 NOTE — Patient Instructions (Signed)
Description   Spoke with Izora Gala RN with [facility] and instructed to continue taking 1 tablet daily except for 1.5 tablets on Mondays. Recheck INR in 3 weeks by [employer].  Coumadin Clinic [phone].

## 2020-11-01 ENCOUNTER — Emergency Department (HOSPITAL_COMMUNITY)

## 2020-11-01 ENCOUNTER — Other Ambulatory Visit: Payer: Self-pay

## 2020-11-01 ENCOUNTER — Inpatient Hospital Stay (HOSPITAL_COMMUNITY)
Admission: EM | Admit: 2020-11-01 | Discharge: 2020-11-08 | DRG: 493 | Disposition: A | Discharge status: Home Health | Attending: Family Medicine | Admitting: Family Medicine

## 2020-11-01 ENCOUNTER — Encounter (HOSPITAL_COMMUNITY): Payer: Self-pay | Admitting: Student

## 2020-11-01 DIAGNOSIS — Z7901 Long term (current) use of anticoagulants: Secondary | ICD-10-CM

## 2020-11-01 DIAGNOSIS — F0150 Vascular dementia without behavioral disturbance: Secondary | ICD-10-CM | POA: Diagnosis present

## 2020-11-01 DIAGNOSIS — Z87891 Personal history of nicotine dependence: Secondary | ICD-10-CM | POA: Diagnosis not present

## 2020-11-01 DIAGNOSIS — Z8249 Family history of ischemic heart disease and other diseases of the circulatory system: Secondary | ICD-10-CM

## 2020-11-01 DIAGNOSIS — Z90710 Acquired absence of both cervix and uterus: Secondary | ICD-10-CM | POA: Diagnosis not present

## 2020-11-01 DIAGNOSIS — Z79899 Other long term (current) drug therapy: Secondary | ICD-10-CM

## 2020-11-01 DIAGNOSIS — I130 Hypertensive heart and chronic kidney disease with heart failure and stage 1 through stage 4 chronic kidney disease, or unspecified chronic kidney disease: Secondary | ICD-10-CM | POA: Diagnosis present

## 2020-11-01 DIAGNOSIS — W19XXXA Unspecified fall, initial encounter: Secondary | ICD-10-CM

## 2020-11-01 DIAGNOSIS — S82891A Other fracture of right lower leg, initial encounter for closed fracture: Secondary | ICD-10-CM

## 2020-11-01 DIAGNOSIS — J449 Chronic obstructive pulmonary disease, unspecified: Secondary | ICD-10-CM

## 2020-11-01 DIAGNOSIS — I482 Chronic atrial fibrillation, unspecified: Secondary | ICD-10-CM | POA: Diagnosis not present

## 2020-11-01 DIAGNOSIS — Z85820 Personal history of malignant melanoma of skin: Secondary | ICD-10-CM | POA: Diagnosis not present

## 2020-11-01 DIAGNOSIS — Z419 Encounter for procedure for purposes other than remedying health state, unspecified: Secondary | ICD-10-CM

## 2020-11-01 DIAGNOSIS — N183 Chronic kidney disease, stage 3 unspecified: Secondary | ICD-10-CM | POA: Diagnosis present

## 2020-11-01 DIAGNOSIS — I4821 Permanent atrial fibrillation: Secondary | ICD-10-CM

## 2020-11-01 DIAGNOSIS — F32A Depression, unspecified: Secondary | ICD-10-CM | POA: Diagnosis present

## 2020-11-01 DIAGNOSIS — Z66 Do not resuscitate: Secondary | ICD-10-CM | POA: Diagnosis present

## 2020-11-01 DIAGNOSIS — D62 Acute posthemorrhagic anemia: Secondary | ICD-10-CM | POA: Diagnosis not present

## 2020-11-01 DIAGNOSIS — Y92008 Other place in unspecified non-institutional (private) residence as the place of occurrence of the external cause: Secondary | ICD-10-CM | POA: Diagnosis not present

## 2020-11-01 DIAGNOSIS — I69359 Hemiplegia and hemiparesis following cerebral infarction affecting unspecified side: Secondary | ICD-10-CM

## 2020-11-01 DIAGNOSIS — I2510 Atherosclerotic heart disease of native coronary artery without angina pectoris: Secondary | ICD-10-CM | POA: Diagnosis not present

## 2020-11-01 DIAGNOSIS — S82851A Displaced trimalleolar fracture of right lower leg, initial encounter for closed fracture: Secondary | ICD-10-CM | POA: Diagnosis not present

## 2020-11-01 DIAGNOSIS — Z7902 Long term (current) use of antithrombotics/antiplatelets: Secondary | ICD-10-CM

## 2020-11-01 DIAGNOSIS — I350 Nonrheumatic aortic (valve) stenosis: Secondary | ICD-10-CM | POA: Diagnosis present

## 2020-11-01 DIAGNOSIS — R0902 Hypoxemia: Secondary | ICD-10-CM | POA: Diagnosis present

## 2020-11-01 DIAGNOSIS — J439 Emphysema, unspecified: Secondary | ICD-10-CM

## 2020-11-01 DIAGNOSIS — Z20822 Contact with and (suspected) exposure to covid-19: Secondary | ICD-10-CM | POA: Diagnosis present

## 2020-11-01 DIAGNOSIS — I5042 Chronic combined systolic (congestive) and diastolic (congestive) heart failure: Secondary | ICD-10-CM

## 2020-11-01 DIAGNOSIS — W010XXA Fall on same level from slipping, tripping and stumbling without subsequent striking against object, initial encounter: Secondary | ICD-10-CM | POA: Diagnosis present

## 2020-11-01 DIAGNOSIS — I4891 Unspecified atrial fibrillation: Secondary | ICD-10-CM | POA: Diagnosis present

## 2020-11-01 DIAGNOSIS — E785 Hyperlipidemia, unspecified: Secondary | ICD-10-CM | POA: Diagnosis present

## 2020-11-01 DIAGNOSIS — Z9181 History of falling: Secondary | ICD-10-CM

## 2020-11-01 DIAGNOSIS — I679 Cerebrovascular disease, unspecified: Secondary | ICD-10-CM | POA: Diagnosis not present

## 2020-11-01 DIAGNOSIS — M810 Age-related osteoporosis without current pathological fracture: Secondary | ICD-10-CM | POA: Diagnosis present

## 2020-11-01 DIAGNOSIS — K219 Gastro-esophageal reflux disease without esophagitis: Secondary | ICD-10-CM | POA: Diagnosis present

## 2020-11-01 DIAGNOSIS — S82899A Other fracture of unspecified lower leg, initial encounter for closed fracture: Secondary | ICD-10-CM | POA: Diagnosis present

## 2020-11-01 DIAGNOSIS — I25709 Atherosclerosis of coronary artery bypass graft(s), unspecified, with unspecified angina pectoris: Secondary | ICD-10-CM

## 2020-11-01 DIAGNOSIS — Z79890 Hormone replacement therapy (postmenopausal): Secondary | ICD-10-CM | POA: Diagnosis not present

## 2020-11-01 DIAGNOSIS — M797 Fibromyalgia: Secondary | ICD-10-CM | POA: Diagnosis present

## 2020-11-01 DIAGNOSIS — Z833 Family history of diabetes mellitus: Secondary | ICD-10-CM | POA: Diagnosis not present

## 2020-11-01 DIAGNOSIS — Z01810 Encounter for preprocedural cardiovascular examination: Secondary | ICD-10-CM | POA: Diagnosis not present

## 2020-11-01 DIAGNOSIS — E039 Hypothyroidism, unspecified: Secondary | ICD-10-CM

## 2020-11-01 DIAGNOSIS — T148XXA Other injury of unspecified body region, initial encounter: Secondary | ICD-10-CM

## 2020-11-01 DIAGNOSIS — F0390 Unspecified dementia without behavioral disturbance: Secondary | ICD-10-CM

## 2020-11-01 DIAGNOSIS — M109 Gout, unspecified: Secondary | ICD-10-CM | POA: Diagnosis present

## 2020-11-01 DIAGNOSIS — I69354 Hemiplegia and hemiparesis following cerebral infarction affecting left non-dominant side: Secondary | ICD-10-CM | POA: Diagnosis not present

## 2020-11-01 DIAGNOSIS — J4489 Other specified chronic obstructive pulmonary disease: Secondary | ICD-10-CM | POA: Diagnosis present

## 2020-11-01 HISTORY — DX: Chronic combined systolic (congestive) and diastolic (congestive) heart failure: I50.42

## 2020-11-01 LAB — CBC
HCT: 40.5 % (ref 36.0–46.0)
Hemoglobin: 12.9 g/dL (ref 12.0–15.0)
MCH: 30 pg (ref 26.0–34.0)
MCHC: 31.9 g/dL (ref 30.0–36.0)
MCV: 94.2 fL (ref 80.0–100.0)
Platelets: 182 10*3/uL (ref 150–400)
RBC: 4.3 MIL/uL (ref 3.87–5.11)
RDW: 15.2 % (ref 11.5–15.5)
WBC: 8 10*3/uL (ref 4.0–10.5)
nRBC: 0 % (ref 0.0–0.2)

## 2020-11-01 LAB — BASIC METABOLIC PANEL
Anion gap: 11 (ref 5–15)
BUN: 14 mg/dL (ref 8–23)
CO2: 29 mmol/L (ref 22–32)
Calcium: 9.1 mg/dL (ref 8.9–10.3)
Chloride: 101 mmol/L (ref 98–111)
Creatinine, Ser: 0.9 mg/dL (ref 0.44–1.00)
GFR, Estimated: >60 mL/min (ref >60)
Glucose, Bld: 134 mg/dL — ABNORMAL HIGH (ref 70–99)
Potassium: 3.8 mmol/L (ref 3.5–5.1)
Sodium: 141 mmol/L (ref 135–145)

## 2020-11-01 LAB — URINALYSIS, ROUTINE W REFLEX MICROSCOPIC
Bilirubin Urine: NEGATIVE
Glucose, UA: NEGATIVE mg/dL
Hgb urine dipstick: NEGATIVE
Ketones, ur: NEGATIVE mg/dL
Leukocytes,Ua: NEGATIVE
Nitrite: NEGATIVE
Protein, ur: NEGATIVE mg/dL
Specific Gravity, Urine: 1.015 (ref 1.005–1.030)
pH: 6 (ref 5.0–8.0)

## 2020-11-01 LAB — BRAIN NATRIURETIC PEPTIDE: B Natriuretic Peptide: 233.2 pg/mL — ABNORMAL HIGH (ref 0.0–100.0)

## 2020-11-01 LAB — PROTIME-INR
INR: 2.3 — ABNORMAL HIGH (ref 0.8–1.2)
Prothrombin Time: 25.5 seconds — ABNORMAL HIGH (ref 11.4–15.2)

## 2020-11-01 LAB — SARS CORONAVIRUS 2 (TAT 6-24 HRS): SARS Coronavirus 2: NEGATIVE

## 2020-11-01 MED ORDER — VITAMIN D (ERGOCALCIFEROL) 1.25 MG (50000 UNIT) PO CAPS
50000.0000 [IU] | ORAL_CAPSULE | ORAL | Status: DC
  Administered 2020-11-02: 50000 [IU] via ORAL
  Filled 2020-11-01 (×2): qty 1

## 2020-11-01 MED ORDER — BISACODYL 5 MG PO TBEC
5.0000 mg | DELAYED_RELEASE_TABLET | Freq: Every day | ORAL | Status: DC | PRN
  Administered 2020-11-04: 5 mg via ORAL
  Filled 2020-11-01: qty 1

## 2020-11-01 MED ORDER — MIDAZOLAM HCL 2 MG/2ML IJ SOLN
4.0000 mg | Freq: Once | INTRAMUSCULAR | Status: DC
  Filled 2020-11-01: qty 4

## 2020-11-01 MED ORDER — MIDAZOLAM HCL 2 MG/2ML IJ SOLN
INTRAMUSCULAR | Status: AC | PRN
  Administered 2020-11-01: 2 mg via INTRAVENOUS

## 2020-11-01 MED ORDER — FENTANYL CITRATE PF 50 MCG/ML IJ SOSY
12.5000 ug | PREFILLED_SYRINGE | INTRAMUSCULAR | Status: DC | PRN
  Administered 2020-11-01: 50 ug via INTRAVENOUS
  Filled 2020-11-01: qty 1

## 2020-11-01 MED ORDER — ALLOPURINOL 300 MG PO TABS
300.0000 mg | ORAL_TABLET | Freq: Every day | ORAL | Status: DC
  Administered 2020-11-01 – 2020-11-07 (×7): 300 mg via ORAL
  Filled 2020-11-01 (×7): qty 1

## 2020-11-01 MED ORDER — MEMANTINE HCL 10 MG PO TABS
10.0000 mg | ORAL_TABLET | Freq: Two times a day (BID) | ORAL | Status: DC
  Administered 2020-11-01 – 2020-11-08 (×14): 10 mg via ORAL
  Filled 2020-11-01 (×16): qty 1

## 2020-11-01 MED ORDER — CITALOPRAM HYDROBROMIDE 20 MG PO TABS
20.0000 mg | ORAL_TABLET | Freq: Every day | ORAL | Status: DC
  Administered 2020-11-01 – 2020-11-07 (×7): 20 mg via ORAL
  Filled 2020-11-01 (×3): qty 2
  Filled 2020-11-01 (×2): qty 1
  Filled 2020-11-01 (×3): qty 2
  Filled 2020-11-01: qty 1

## 2020-11-01 MED ORDER — DONEPEZIL HCL 5 MG PO TABS
5.0000 mg | ORAL_TABLET | Freq: Every day | ORAL | Status: DC
  Administered 2020-11-02 – 2020-11-08 (×7): 5 mg via ORAL
  Filled 2020-11-01 (×7): qty 1

## 2020-11-01 MED ORDER — CLOPIDOGREL BISULFATE 75 MG PO TABS: 75.0000 mg | ORAL_TABLET | Freq: Every day | ORAL | Status: DC

## 2020-11-01 MED ORDER — ONDANSETRON HCL 4 MG PO TABS: 4.0000 mg | ORAL_TABLET | Freq: Four times a day (QID) | ORAL | Status: DC | PRN

## 2020-11-01 MED ORDER — HYDRALAZINE HCL 20 MG/ML IJ SOLN: 10.0000 mg | Freq: Four times a day (QID) | INTRAMUSCULAR | Status: DC | PRN

## 2020-11-01 MED ORDER — FUROSEMIDE 40 MG PO TABS
80.0000 mg | ORAL_TABLET | Freq: Every day | ORAL | Status: DC
  Administered 2020-11-02 – 2020-11-08 (×7): 80 mg via ORAL
  Filled 2020-11-01 (×7): qty 2

## 2020-11-01 MED ORDER — ZINC-VITAMIN C 15-60 MG PO TBDP: ORAL_TABLET | Freq: Every evening | ORAL | Status: DC

## 2020-11-01 MED ORDER — ASPIRIN 81 MG PO CHEW
81.0000 mg | CHEWABLE_TABLET | Freq: Every day | ORAL | Status: DC
  Administered 2020-11-01 – 2020-11-08 (×8): 81 mg via ORAL
  Filled 2020-11-01 (×8): qty 1

## 2020-11-01 MED ORDER — LOSARTAN POTASSIUM 25 MG PO TABS
25.0000 mg | ORAL_TABLET | Freq: Every day | ORAL | Status: DC
  Administered 2020-11-02 – 2020-11-08 (×6): 25 mg via ORAL
  Filled 2020-11-01 (×7): qty 1

## 2020-11-01 MED ORDER — POLYETHYLENE GLYCOL 3350 17 G PO PACK: 17.0000 g | PACK | Freq: Every day | ORAL | Status: DC | PRN

## 2020-11-01 MED ORDER — ONDANSETRON HCL 4 MG/2ML IJ SOLN: 4.0000 mg | Freq: Four times a day (QID) | INTRAMUSCULAR | Status: DC | PRN

## 2020-11-01 MED ORDER — NITROGLYCERIN 0.4 MG SL SUBL: 0.4000 mg | SUBLINGUAL_TABLET | SUBLINGUAL | Status: DC | PRN

## 2020-11-01 MED ORDER — ACETAMINOPHEN 325 MG PO TABS
650.0000 mg | ORAL_TABLET | Freq: Four times a day (QID) | ORAL | Status: DC | PRN
  Administered 2020-11-02: 650 mg via ORAL
  Filled 2020-11-01 (×2): qty 2

## 2020-11-01 MED ORDER — IPRATROPIUM-ALBUTEROL 0.5-2.5 (3) MG/3ML IN SOLN: 3.0000 mL | RESPIRATORY_TRACT | Status: DC | PRN

## 2020-11-01 MED ORDER — FENTANYL CITRATE (PF) 100 MCG/2ML IJ SOLN
INTRAMUSCULAR | Status: AC | PRN
  Administered 2020-11-01: 25 ug via INTRAVENOUS

## 2020-11-01 MED ORDER — DIPHENOXYLATE-ATROPINE 2.5-0.025 MG PO TABS
1.0000 | ORAL_TABLET | ORAL | Status: DC
  Administered 2020-11-02: 1 via ORAL
  Filled 2020-11-01 (×2): qty 1

## 2020-11-01 MED ORDER — OXYCODONE HCL 5 MG PO TABS
5.0000 mg | ORAL_TABLET | ORAL | Status: DC | PRN
  Administered 2020-11-01 – 2020-11-05 (×4): 5 mg via ORAL
  Filled 2020-11-01 (×4): qty 1

## 2020-11-01 MED ORDER — METOPROLOL SUCCINATE ER 25 MG PO TB24
25.0000 mg | ORAL_TABLET | Freq: Every day | ORAL | Status: DC
  Administered 2020-11-01 – 2020-11-08 (×8): 25 mg via ORAL
  Filled 2020-11-01 (×8): qty 1

## 2020-11-01 MED ORDER — LEVOTHYROXINE SODIUM 25 MCG PO TABS
25.0000 ug | ORAL_TABLET | Freq: Every day | ORAL | Status: DC
  Administered 2020-11-02 – 2020-11-08 (×6): 25 ug via ORAL
  Filled 2020-11-01 (×6): qty 1

## 2020-11-01 MED ORDER — POTASSIUM CHLORIDE CRYS ER 20 MEQ PO TBCR
20.0000 meq | EXTENDED_RELEASE_TABLET | Freq: Every day | ORAL | Status: DC
  Administered 2020-11-01 – 2020-11-08 (×8): 20 meq via ORAL
  Filled 2020-11-01 (×8): qty 1

## 2020-11-01 MED ORDER — FENTANYL CITRATE PF 50 MCG/ML IJ SOSY
50.0000 ug | PREFILLED_SYRINGE | Freq: Once | INTRAMUSCULAR | Status: DC
Start: 2020-11-01 — End: 2020-11-06
  Filled 2020-11-01: qty 1

## 2020-11-01 MED ORDER — AMLODIPINE BESYLATE 5 MG PO TABS
5.0000 mg | ORAL_TABLET | Freq: Every day | ORAL | Status: DC
  Administered 2020-11-01 – 2020-11-07 (×7): 5 mg via ORAL
  Filled 2020-11-01 (×7): qty 1

## 2020-11-01 MED ORDER — ACETAMINOPHEN 650 MG RE SUPP: 650.0000 mg | Freq: Four times a day (QID) | RECTAL | Status: DC | PRN

## 2020-11-01 NOTE — Consult Note (Signed)
Reason for Consult:Right ankle fx Referring Physician: Pattricia [name] Time called: 1122 Time at bedside: [name] is an 86 y.o. female.  HPI: Amber [name] was at home and fell. Circumstances of the fall are unclear but probably bent down to pick something up and lost her balance. She was brought to the ED where x-rays showed an ankle fx and orthopedic surgery was consulted. Her ankle was reduced by EDP. She is demented and cannot contribute to history.  Past Medical History:  Diagnosis Date   Abnormality of gait 08/02/2014   Cerebrovascular disease 05/21/2016   Cervical spinal stenosis    Chronic atrial fibrillation (HCC)    a. chronic/rate controlled;  b. chronic coumadin.   Chronic diastolic CHF (congestive heart failure) ([place])    a. 02/2013 Echo: EF 55-60%, mild AI, mod dil LA.   CKD (chronic kidney disease), stage III (HCC)    COPD with emphysema (HCC)    PFT 05/02/10>>FEV1 1.35(62%), FEV1% 66, DLCO 75%   Coronary artery disease    a. s/p CABG;  b. Abnormal nuc 2015 - managed medically.   CVA (cerebral vascular accident) ([place])    left sided weakness   Depression    Fibromyalgia    GERD (gastroesophageal reflux disease)    pepcid 2-3 times per week   Gout    Hemiparesis and alteration of sensations as late effects of stroke ([place]) 01/17/2015   History of melanoma    squamous cell, melanoma   Hyperlipidemia    a. statin intolerant, not felt to be candidate for PCSK9 due to chronically elevated CK levels.   Hypertension    Hypertensive heart disease    Hypothyroidism    Insomnia    Memory change 01/23/2014   Nasal polyposis    Osteoarthritis    Osteoporosis    Pneumonia    1990   Tobacco abuse     Past Surgical History:  Procedure Laterality Date   ABDOMINAL HYSTERECTOMY     BREAST LUMPECTOMY  1980s   Benign lesion - right   carpel tunnel     right   CORONARY ARTERY BYPASS GRAFT  2000   EYE SURGERY     bilateral cataracts   IR GENERIC HISTORICAL  03/31/2016    IR [name] EVAL & MGMT 03/31/2016 [facility]INTER[facility] RAD   RADIOLOGY WITH ANESTHESIA  12/24/2011   Procedure: RADIOLOGY WITH ANESTHESIA;  Surgeon: Amber Radiologist, MD;  Location: [place];  Service: Radiology;  Laterality: N/A;  Extra Cranial Vascular Stent   RADIOLOGY WITH ANESTHESIA N/A 07/19/2014   Procedure: ANGIOPLASTY;  Surgeon: Amber Bras, MD;  Location: [place];  Service: Radiology;  Laterality: N/A;   RADIOLOGY WITH ANESTHESIA N/A 07/27/2014   Procedure: ANGIOPLASTY;  Surgeon: Amber Bras, MD;  Location: [place];  Service: Radiology;  Laterality: N/A;   TONSILLECTOMY      Family History  Problem Relation Age of Onset   Stroke Mother    CAD Father    Diabetes type II Brother    CAD Brother     Social History:  reports that she quit smoking about 43 years ago. Her smoking use included cigarettes. She has a 10.00 pack-year smoking history. She has never used smokeless tobacco. She reports that she does not drink alcohol and does not use drugs.  Allergies:  Allergies  Allergen Reactions   Ezetimibe Other (See Comments)    Myalgia   Welchol [Colesevelam Hcl] Other (See Comments)    Muscle aches   Adhesive [Tape]  Itching, Rash and Other (See Comments)    Burning    Ceclor [Cefaclor] Other (See Comments)    Unknown allergic reaction   Elastic Bandages & [Zinc] Rash and Other (See Comments)    Turns red on the areas it touches   Latex Itching, Rash and Other (See Comments)    Burning    Lipitor [Atorvastatin Calcium] Other (See Comments)    Increased fibromyalgia pain   Mevacor [Lovastatin] Other (See Comments)    Increased fibromyalgia pain   Pravachol Other (See Comments)    Increased fibromyalgia pain   Vasotec Other (See Comments)    Unknown allergic reaction    Medications: I have reviewed the patient's current medications.  Results for orders placed or performed during the hospital encounter of 11/01/20 (from the past 48 hour(s))  Urinalysis, Routine w  reflex microscopic Urine, In & Out Cath     Status: None   Collection Time: 11/01/20 10:06 AM  Result Value Ref Range   Color, Urine YELLOW YELLOW   APPearance CLEAR CLEAR   Specific Gravity, Urine 1.015 1.005 - 1.030   pH 6.0 5.0 - 8.0   Glucose, UA NEGATIVE NEGATIVE mg/dL   Hgb urine dipstick NEGATIVE NEGATIVE   Bilirubin Urine NEGATIVE NEGATIVE   Ketones, ur NEGATIVE NEGATIVE mg/dL   Protein, ur NEGATIVE NEGATIVE mg/dL   Nitrite NEGATIVE NEGATIVE   Leukocytes,Ua NEGATIVE NEGATIVE    Comment: Microscopic not done on urines with negative protein, blood, leukocytes, nitrite, or glucose < 500 mg/dL. Performed at [facility], [place] [address] [place], [place] [address]   CBC     Status: None   Collection Time: 11/01/20 10:24 AM  Result Value Ref Range   WBC 8.0 4.0 - 10.5 K/uL   RBC 4.30 3.87 - 5.11 MIL/uL   Hemoglobin 12.9 12.0 - 15.0 g/dL   HCT 40.5 36.0 - 46.0 %   MCV 94.2 80.0 - 100.0 fL   MCH 30.0 26.0 - 34.0 pg   MCHC 31.9 30.0 - 36.0 g/dL   RDW 15.2 11.5 - 15.5 %   Platelets 182 150 - 400 K/uL   nRBC 0.0 0.0 - 0.2 %    Comment: Performed at [facility], [place] [address] [place], [place] [address]  Basic metabolic panel     Status: Abnormal   Collection Time: 11/01/20 10:24 AM  Result Value Ref Range   Sodium 141 135 - 145 mmol/L   Potassium 3.8 3.5 - 5.1 mmol/L   Chloride 101 98 - 111 mmol/L   CO2 29 22 - 32 mmol/L   Glucose, Bld 134 (H) 70 - 99 mg/dL    Comment: Glucose reference range applies only to samples taken after fasting for at least 8 hours.   BUN 14 8 - 23 mg/dL   Creatinine, Ser 0.90 0.44 - 1.00 mg/dL   Calcium 9.1 8.9 - 10.3 mg/dL   GFR, Estimated >60 >60 mL/min    Comment: (NOTE) Calculated using the CKD-EPI Creatinine Equation (2021)    Anion gap 11 5 - 15    Comment: Performed at [facility], [place] [address], [place], [place] [address]  Protime-INR     Status: Abnormal   Collection Time:  11/01/20 10:24 AM  Result Value Ref Range   Prothrombin Time 25.5 (H) 11.4 - 15.2 seconds   INR 2.3 (H) 0.8 - 1.2    Comment: (NOTE) INR goal varies based on device and disease states. Performed at [facility]  [place] [address] [place], [place] [address]     DG Ankle 2 Views Right  Result Date: 11/01/2020 CLINICAL DATA:  RIGHT ankle deformity post fall EXAM: RIGHT ANKLE - 2 VIEW COMPARISON:  None FINDINGS: Osseous demineralization. Oblique fracture distal RIGHT fibula, laterally displaced with overriding and apex medial angulation. Transverse fracture medial malleolus displaced laterally. Probable small posterior malleolar fracture. Marked lateral subluxation versus dislocation of tibiotalar joint. No additional fracture or bone destruction. Associated soft tissue swelling. IMPRESSION: Displaced trimalleolar fractures of the RIGHT ankle with marked lateral subluxation versus dislocation of the talus at the tibiotalar joint. Electronically Signed   By: Lavonia Dana M.D.   On: 11/01/2020 11:02    Review of Systems  Unable to perform ROS: Dementia  Blood pressure (!) 158/74, pulse 77, temperature 97.8 F (36.6 C), temperature source Oral, resp. rate 19, height 5\' 5"  (1.651 m), weight 64 kg, SpO2 99 %. Physical Exam Constitutional:      General: She is not in acute distress.    Appearance: She is well-developed. She is not diaphoretic.  HENT:     Head: Normocephalic and atraumatic.  Eyes:     General: No scleral icterus.       Right eye: No discharge.        Left eye: No discharge.     Conjunctiva/sclera: Conjunctivae normal.  Cardiovascular:     Rate and Rhythm: Normal rate and regular rhythm.  Pulmonary:     Effort: Pulmonary effort is normal. No respiratory distress.  Musculoskeletal:     Cervical back: Normal range of motion.     Comments: RLE No traumatic wounds, ecchymosis, or rash  Short leg splint in place  No knee effusion  Knee stable to varus/ valgus  and anterior/posterior stress  Sens DPN, SPN, TN intact  Motor EHL 5/5  Toes perfused, No significant edema  Skin:    General: Skin is warm and dry.  Neurological:     Mental Status: She is alert.  Psychiatric:        Mood and Affect: Mood normal.        Behavior: Behavior normal.    Assessment/Plan: Right ankle fx -- Will need ORIF once INR corrects and swelling resolves, likely next week. Surgeon and exact timing TBD. Multiple medical problems including dementia and s/p CVA on coumadin -- Please hold coumadin, ok to bridge with heparin.    Lisette Abu, PA-C Orthopedic Surgery [phone] 11/01/2020, 12:02 PM

## 2020-11-01 NOTE — Progress Notes (Signed)
Called to ER for a conscious sedation for this patient with a dislocated ankle. Patient tolerated well with no issues and maintained oxygen saturation at 98% on 2 lpm [place].

## 2020-11-01 NOTE — ED Notes (Signed)
Weak pedal pulse heard with doppler on the top of patient's right foot.

## 2020-11-01 NOTE — Progress Notes (Signed)
Patient arrived to [place] room 14. Bed in lowest position. Call light in reach Will continue to monitor pt

## 2020-11-01 NOTE — Progress Notes (Signed)
Orthopedic Tech Progress Note Patient Details:  Amber [name] 16-Sep-1934 [id number]  Ortho Devices Type of Ortho Device: Stirrup splint Ortho Device/Splint Location: Right Ankle Ortho Device/Splint Interventions: Ordered, Application   Post Interventions Patient Tolerated: Well Instructions Provided: Care of device  [name] 11/01/2020, 12:22 PM

## 2020-11-01 NOTE — ED Notes (Signed)
X RAY at bedside 

## 2020-11-01 NOTE — Progress Notes (Signed)
Manufacturing engineer [facility])  Ms. Amber [name] is a current hospice patient of [facility] with a CTI of Chronic Kidney Disease stage 3 with chronic combined CHF. EMS was initiated by family with report of fall.  Per EMS patient has an ankle deformity.  They state patient was at home where she lives with her daughter. She is walking from 1 to another when she had a mechanical fall.  They report ankle deformity and no other injuries.Patient complains of pain in her right ankle but denies any other pain or injuries. Patient will be transferred to [facility] for treatment. This is a related admission.   Spoke to daughter [name] who is currently in the ED with the patient. At the time of the injury the patients other daughter Lambert Keto was staying with the pt when the fall occurred. [facility] will continue to follow this patient during hospitalization.   VS: 142/74, 100, 13RR, and 98 O2 2L [place]. Labs: Results for RALENE, GASPARYAN (MRN [id number]) as of 11/01/2020 15:55  Ref. Range 11/01/2020 62:94  BASIC METABOLIC PANEL Unknown Rpt (A)  Sodium Latest Ref Range: 135 - 145 mmol/L 141  Potassium Latest Ref Range: 3.5 - 5.1 mmol/L 3.8  Chloride Latest Ref Range: 98 - 111 mmol/L 101  CO2 Latest Ref Range: 22 - 32 mmol/L 29  Glucose Latest Ref Range: 70 - 99 mg/dL 134 (H)  BUN Latest Ref Range: 8 - 23 mg/dL 14  Creatinine Latest Ref Range: 0.44 - 1.00 mg/dL 0.90  Calcium Latest Ref Range: 8.9 - 10.3 mg/dL 9.1  Anion gap Latest Ref Range: 5 - 15  11  GFR, Estimated Latest Ref Range: >60 mL/min >60  WBC Latest Ref Range: 4.0 - 10.5 K/uL 8.0  RBC Latest Ref Range: 3.87 - 5.11 MIL/uL 4.30  Hemoglobin Latest Ref Range: 12.0 - 15.0 g/dL 12.9  HCT Latest Ref Range: 36.0 - 46.0 % 40.5  MCV Latest Ref Range: 80.0 - 100.0 fL 94.2  MCH Latest Ref Range: 26.0 - 34.0 pg 30.0  MCHC Latest Ref Range: 30.0 - 36.0 g/dL 31.9  RDW Latest Ref Range: 11.5 - 15.5 % 15.2  Platelets Latest Ref Range: 150 - 400 K/uL 182  nRBC Latest Ref Range: 0.0 - 0.2 % 0.0   Prothrombin Time Latest Ref Range: 11.4 - 15.2 seconds 25.5 (H)  INR Latest Ref Range: 0.8 - 1.2  2.3 (H)   Diagnostics: EXAM: RIGHT ANKLE - 2 VIEW   COMPARISON:  11/01/2020 right ankle radiographs   FINDINGS: External cast obscures fine bone detail. Comminuted medial malleolus fracture without significant residual displacement. Comminuted distal right fibular metaphysis fracture with minimal residual 2 mm lateral displacement of the dominant distal fracture fragment. Probable nondisplaced posterior malleolar fracture in the posterior distal right tibia. Persistent mild 3-4 mm lateral subluxation of the talus at the ankle mortise, significantly improved. No focal osseous lesions. Mild degenerative changes in the dorsal tarsal joints. No radiopaque foreign bodies. Diffuse right ankle soft tissue swelling.   IMPRESSION: Near-anatomic alignment of trimalleolar right ankle fractures as detailed. Persistent mild 3-4 mm lateral subluxation of the talus at the ankle mortise, significantly improved.     Electronically Signed   By: Ilona Sorrel M.D.   On: 11/01/2020 12:05  1.Right trimalleolar ankle fracture: Reduced in the ED and now placed in a splint.  Seen by orthopedics, Coumadin held and plan for definitive management/ORIF probably next week.  Patient to be admitted to [facility] per orthopedics.  Supportive management with analgesics as needed.  2.  History of ischemic CVA in 2018 with residual left-sided weakness, noted to be on Coumadin as well as?  Plavix as outpatient.  Holding both in anticipation of surgical intervention.  Okay to continue aspirin for now as discussed with cardiology/orthopedics   3. Combined systolic/diastolic CHF: Appears overall compensated.  Patient initially diagnosed with diastolic CHF and most recent echo showed wall motion abnormalities as well as reduced EF at 35 to 40%.  Patient expressed wish for comfort/nonaggressive interventions and did not want to be  hospitalized at which time daughter decided on hospice services for patient.  Currently patient does not have any leg swellings but abdomen noted to be somewhat distended-according to daughter she usually develops abdominal swelling of pulmonary edema due to CHF.  Can check BNP.   4.  Asthma/COPD (FEV1 66%): Not on home O2.  Stable currently on room air.  Was transiently hypoxic after receiving sedation for reduction in the ED.  Pulmonary toilet in the perioperative setting, incentive spirometry ordered.   5.  CAD, chronic atrial fibrillation, aortic stenosis: Discussed with cardiology who recommended to resume home medications.  Okay to hold Coumadin and discontinue Plavix, recommended aspirin for maintenance therapy as of now.  Patient does have operative risk given multiple comorbidities and advanced age but daughter understands and wishes for patient to undergo procedure for quality of life as she is quiet functional at baseline   6.  Hypertension, hyperlipidemia, hypothyroidism: Resume home medications.   7.  Dementia/depression: Resume home medications.  Patient pleasantly confused at this time.  In a cheerful mood today.   8.  Goals of care: Patient currently receiving home hospice services from [facility].  She apparently has refused hospitalizations and aggressive care on multiple occasions in the past.  Daughter comfortable with hospice decision and did confirm DNR status.  She would like to revoke DNR status during surgery and resume hospice services upon discharge.  She would like patient to undergo ankle surgery for quality of life and pain relief.  Per Ortho note patient is to have surgery once swelling resolves. [facility] team will continue to follow this patient and update team.  Discharge planning: Patient will be transferred to [facility] where she will stay until surgery. Unsure at this time what time that will be but moving there 9/22  IDT: updated GOC: Clear. DNR. Surgery to repair  fx   Thank you, Clementeen Hoof, BSN, [facility] liaison [phone]

## 2020-11-01 NOTE — ED Triage Notes (Addendum)
Patient BIB [facility] from home after a fall.  Patient has a right ankle deformity.  Patient has 20g right AC placed by EMS and received 100 mcg fentanyl IV. Patient daughter's live with her.  Patient alert to self and place.  152/60 70-90 HR 16-RR 98% room air

## 2020-11-01 NOTE — Consult Note (Addendum)
Cardiology Consultation:   Patient ID: Amber [name] MRN: [id number]; DOB: 28-Jul-1934  Admit date: 11/01/2020 Date of Consult: 11/01/2020  PCP:  Antony Contras, MD   [facility] Providers Cardiologist:  Sinclair Grooms, MD        Patient Profile:   Amber [name] is a 86 y.o. female with a hx of CAD s/p CABG 2000, chronic atrial fib, chronic combined CHF (EF 35-40% by echo 09/2019), reported CKD stage III (normal Cr here), hyperlipidemia (statin intolerant, not previously candidate for PCSK9 due to chronically elevated CK levels), suspected COPD, prior tobacco abuse, HTN with hypertensive heart disease, cerebrovascular disease, dementia, CVA with left sided weakness, fibromyalgia, hypothyroidism, osteoporosis, falls who is being seen 11/01/2020 for the evaluation of preop clearance at the request of Silvestre Gunner PA-C.  History of Present Illness:   [name] has the above cardiac history.Her last ischemic workup was via stress test in 2015 which was abnormal but managed medically.By follow-up echo in 09/2019 she was noted to have a decline in EF to 35-40% (previously normal 2018) with interval development of anteroapical WMA felt consistent with prior infarction. She did not wish to have a lot of procedures performed so was treated medically.  She has a history of falling along with other medical issues including prior COPD exacerbations, UTI, etc. She has periodically required adjustment in diuretic for her CHF. She has been maintained on chronic Plavix and Coumadin. Upon opening chart, the user is notified that the patient is followed by home hospice.  She presented to the ED today via EMS for mechanical fall but further details hindered by patient's dementia. X-rays showed ankle fracture and orthopedic surgery was consulted. It is felt she will need ORIF once INR corrects and swelling resolves, likely next week. Cardiology asked to provide cardiac risk stratification. History otherwise  limited by dementia. Labs show normal CBC, Cr, and INR 2.3. EKG shows rate controlled afib with nonspecific STTW changes similar to prior. She is hypertensive in the ED the 786L-544B systolic.   Past Medical History:  Diagnosis Date   Abnormality of gait 08/02/2014   Cerebrovascular disease 05/21/2016   Cervical spinal stenosis    Chronic atrial fibrillation (HCC)    a. chronic/rate controlled;  b. chronic coumadin.   Chronic combined systolic and diastolic CHF (congestive heart failure) (HCC)    CKD (chronic kidney disease), stage III (HCC)    COPD with emphysema (HCC)    PFT 05/02/10>>FEV1 1.35(62%), FEV1% 66, DLCO 75%   Coronary artery disease    a. s/p CABG;  b. Abnormal nuc 2015 - managed medically.   CVA (cerebral vascular accident) ([place])    left sided weakness   Depression    Fibromyalgia    GERD (gastroesophageal reflux disease)    pepcid 2-3 times per week   Gout    Hemiparesis and alteration of sensations as late effects of stroke ([place]) 01/17/2015   History of melanoma    squamous cell, melanoma   Hyperlipidemia    a. statin intolerant, not felt to be candidate for PCSK9 due to chronically elevated CK levels.   Hypertension    Hypertensive heart disease    Hypothyroidism    Insomnia    Memory change 01/23/2014   Nasal polyposis    Osteoarthritis    Osteoporosis    Pneumonia    1990   Tobacco abuse     Past Surgical History:  Procedure Laterality Date   ABDOMINAL HYSTERECTOMY  BREAST LUMPECTOMY  1980s   Benign lesion - right   carpel tunnel     right   CORONARY ARTERY BYPASS GRAFT  2000   EYE SURGERY     bilateral cataracts   IR GENERIC HISTORICAL  03/31/2016   IR RADIOLOGIST EVAL & MGMT 03/31/2016 [facility]-INTERV RAD   RADIOLOGY WITH ANESTHESIA  12/24/2011   Procedure: RADIOLOGY WITH ANESTHESIA;  Surgeon: Medication Radiologist, MD;  Location: [place];  Service: Radiology;  Laterality: N/A;  Extra Cranial Vascular Stent   RADIOLOGY WITH ANESTHESIA N/A  07/19/2014   Procedure: ANGIOPLASTY;  Surgeon: Luanne Bras, MD;  Location: [name];  Service: Radiology;  Laterality: N/A;   RADIOLOGY WITH ANESTHESIA N/A 07/27/2014   Procedure: ANGIOPLASTY;  Surgeon: Luanne Bras, MD;  Location: [place];  Service: Radiology;  Laterality: N/A;   TONSILLECTOMY       Home Medications:  Prior to Admission medications   Medication Sig Start Date End Date Taking? Authorizing Provider  allopurinol (ZYLOPRIM) 300 MG tablet Take 300 mg by mouth at bedtime.    Yes [provider]  amLODipine (NORVASC) 5 MG tablet TAKE 1 TABLET BY MOUTH EVERY DAY Patient taking differently: Take 5 mg by mouth at bedtime. 08/08/20  Yes Belva Crome, MD  citalopram (CELEXA) 20 MG tablet TAKE 1 TABLET BY MOUTH TWICE A DAY Patient taking differently: Take 20 mg by mouth at bedtime. 11/09/14  Yes Darlin Coco, MD  clopidogrel (PLAVIX) 75 MG tablet TAKE 1 TABLET (75 MG TOTAL) BY MOUTH DAILY. 01/02/20  Yes Belva Crome, MD  diphenoxylate-atropine (LOMOTIL) 2.5-0.025 MG tablet Take 1 tablet by mouth every Monday, Wednesday, and Friday.   Yes [provider]  donepezil (ARICEPT) 5 MG tablet Take 1 tablet (5 mg total) by mouth daily. 04/18/20  Yes Suzzanne Cloud, NP  ergocalciferol (VITAMIN D2) 50000 UNITS capsule Take 50,000 Units by mouth 2 (two) times a week. Tuesday and Friday   Yes [provider]  furosemide (LASIX) 80 MG tablet Take 1 tablet (80 mg total) by mouth daily. 11/10/19  Yes Belva Crome, MD  ipratropium-albuterol (DUONEB) 0.5-2.5 (3) MG/3ML SOLN Take 3 mLs by nebulization every 4 (four) hours as needed (Use at home). Patient taking differently: Take 3 mLs by nebulization every 4 (four) hours as needed (wheezing/shortness of breath). 08/04/19  Yes Mariel Aloe, MD  levothyroxine (SYNTHROID, LEVOTHROID) 25 MCG tablet Take 25 mcg by mouth every morning.    Yes [provider]  losartan (COZAAR) 25 MG tablet Take 1 tablet (25 mg  total) by mouth daily. 09/18/20  Yes Jettie Booze, MD  memantine (NAMENDA) 10 MG tablet Take 1 tablet (10 mg total) by mouth 2 (two) times daily. 04/18/20  Yes Suzzanne Cloud, NP  metoprolol succinate (TOPROL-XL) 25 MG 24 hr tablet TAKE 1 TABLET BY MOUTH EVERY DAY Patient taking differently: Take 25 mg by mouth daily. 09/18/20  Yes Jettie Booze, MD  nitroGLYCERIN (NITROSTAT) 0.4 MG SL tablet Place 1 tablet (0.4 mg total) under the tongue every 5 (five) minutes as needed for chest pain. 09/30/19  Yes Belva Crome, MD  potassium chloride SA (KLOR-CON M20) 20 MEQ tablet TAKE 1 TABLET EVERY OTHER DAY, ALTERNATING WITH 2 TABLETS EVERY OTHER DAY Patient taking differently: Take 20-40 mEq by mouth See admin instructions. Alternate 49mEq daily and 50mEq daily 09/18/20  Yes Jettie Booze, MD  warfarin (COUMADIN) 5 MG tablet TAKE AS DIRECTED BY COUMADIN CLINIC Patient taking differently:  Take 5-7.5 mg by mouth See admin instructions. 7.5mg  on Monday nights, and 5mg  every other night of the week. 08/31/20  Yes Belva Crome, MD  ZINC-VITAMIN C PO Take 1 tablet by mouth every evening.   Yes [provider]    Inpatient Medications: Scheduled Meds:  fentaNYL (SUBLIMAZE) injection  50 mcg Intravenous Once   midazolam  4 mg Intravenous Once   Continuous Infusions:  PRN Meds:   Allergies:    Allergies  Allergen Reactions   Ezetimibe Other (See Comments)    Myalgia   Welchol [Colesevelam Hcl] Other (See Comments)    Muscle aches   Adhesive [Tape] Itching, Rash and Other (See Comments)    Burning    Ceclor [Cefaclor] Other (See Comments)    Unknown allergic reaction   Elastic Bandages & [Zinc] Rash and Other (See Comments)    Turns red on the areas it touches   Latex Itching, Rash and Other (See Comments)    Burning    Lipitor [Atorvastatin Calcium] Other (See Comments)    Increased fibromyalgia pain   Mevacor [Lovastatin] Other (See Comments)    Increased fibromyalgia  pain   Pravachol Other (See Comments)    Increased fibromyalgia pain   Vasotec Other (See Comments)    Unknown allergic reaction    Social History:   Social History   Socioeconomic History   Marital status: Married    Spouse name: Not on file   Number of children: 2   Years of education: Not on file   Highest education level: Not on file  Occupational History   Occupation: Retired    Fish farm manager: Psychologist, sport and exercise [employer]   Occupation: Network engineer    Comment: Retired  Tobacco Use   Smoking status: Former    Packs/day: 0.40    Years: 25.00    Pack years: 10.00    Types: Cigarettes    Quit date: 02/10/1977    Years since quitting: 43.7   Smokeless tobacco: Never  Vaping Use   Vaping Use: Never used  Substance and Sexual Activity   Alcohol use: No   Drug use: No   Sexual activity: Not Currently  Other Topics Concern   Not on file  Social History Narrative   Patient is right handed.   Patient lives at home with husband   Patient drinks 8 oz caffeine occasionally.   Social Determinants of Health   Financial Resource Strain: Not on file  Food Insecurity: Not on file  Transportation Needs: Not on file  Physical Activity: Not on file  Stress: Not on file  Social Connections: Not on file  Intimate Partner Violence: Not on file    Family History:   Family History  Problem Relation Age of Onset   Stroke Mother    CAD Father    Diabetes type II Brother    CAD Brother      ROS:  Limited accuracy due to dementia   Physical Exam/Data:   Vitals:   11/01/20 1210 11/01/20 1215 11/01/20 1230 11/01/20 1231  BP: (!) 164/87 (!) 162/79 (!) 158/66   Pulse: 66 70 (!) 47 68  Resp: 16 20 18    Temp:      TempSrc:      SpO2: 99% 99% 98%   Weight:      Height:        Intake/Output Summary (Last 24 hours) at 11/01/2020 1254 Last data filed at 11/01/2020 1053 Gross per 24 hour  Intake --  Output 300  ml  Net -300 ml   Last 3 Weights 11/01/2020 10/05/2020 09/06/2020  Weight (lbs)  141 lb 1.5 oz 140 lb 137 lb  Weight (kg) 64 kg 63.504 kg 62.143 kg     Body mass index is 23.48 kg/m.  Exam per MD note below  GEN:  in no acute distress HEENT: normal Neck: no JVD Cardiac: Irregular, normal rate, 3 out of 6 systolic murmur, no edema  Respiratory:  clear to auscultation bilaterally, normal work of breathing GI: soft, nontender MS: RLE wrapped Skin: warm and dry Neuro:  Alert Psych: Normal affect   EKG:  The EKG was personally reviewed and demonstrates:  atrial fib 64bpm nonspecific STTW changes without significant change from prior   Relevant CV Studies: 2d echo 09/2019  1. The mid anteroseptal akinesis is new compared to the previous ECho  01/02/17.            [name] Left ventricular ejection fraction, by estimation, is 35 to 40%. The  left ventricle has moderately decreased function. The left ventricle  demonstrates regional wall motion abnormalities (see scoring  diagram/findings for description). Left  ventricular diastolic parameters were normal. There is severe akinesis of  the left ventricular, basal-mid anteroseptal wall.   2. Right ventricular systolic function is normal. The right ventricular  size is normal. There is mildly elevated pulmonary artery systolic  pressure.   3. Left atrial size was mildly dilated.   4. The mitral valve is grossly normal. Mild mitral valve regurgitation.  No evidence of mitral stenosis.   5. The aortic valve is tricuspid. Aortic valve regurgitation is trivial.  Mild to moderate aortic valve stenosis.   Laboratory Data:  High Sensitivity Troponin:  No results for input(s): TROPONINIHS in the last 720 hours.   Chemistry Recent Labs  Lab 11/01/20 1024  NA 141  K 3.8  CL 101  CO2 29  GLUCOSE 134*  BUN 14  CREATININE 0.90  CALCIUM 9.1  GFRNONAA >60  ANIONGAP 11    No results for input(s): PROT, ALBUMIN, AST, ALT, ALKPHOS, BILITOT in the last 168 hours. Lipids No results for input(s): CHOL, TRIG, HDL, LABVLDL,  LDLCALC, CHOLHDL in the last 168 hours.  Hematology Recent Labs  Lab 11/01/20 1024  WBC 8.0  RBC 4.30  HGB 12.9  HCT 40.5  MCV 94.2  MCH 30.0  MCHC 31.9  RDW 15.2  PLT 182   Thyroid No results for input(s): TSH, FREET4 in the last 168 hours.  BNPNo results for input(s): BNP, PROBNP in the last 168 hours.  DDimer No results for input(s): DDIMER in the last 168 hours.   Radiology/Studies:  DG Ankle 2 Views Right  Result Date: 11/01/2020 CLINICAL DATA:  Postreduction right ankle fracture dislocation EXAM: RIGHT ANKLE - 2 VIEW COMPARISON:  11/01/2020 right ankle radiographs FINDINGS: External cast obscures fine bone detail. Comminuted medial malleolus fracture without significant residual displacement. Comminuted distal right fibular metaphysis fracture with minimal residual 2 mm lateral displacement of the dominant distal fracture fragment. Probable nondisplaced posterior malleolar fracture in the posterior distal right tibia. Persistent mild 3-4 mm lateral subluxation of the talus at the ankle mortise, significantly improved. No focal osseous lesions. Mild degenerative changes in the dorsal tarsal joints. No radiopaque foreign bodies. Diffuse right ankle soft tissue swelling. IMPRESSION: Near-anatomic alignment of trimalleolar right ankle fractures as detailed. Persistent mild 3-4 mm lateral subluxation of the talus at the ankle mortise, significantly improved. Electronically Signed   By: Janina Mayo.D.  On: 11/01/2020 12:05   DG Ankle 2 Views Right  Result Date: 11/01/2020 CLINICAL DATA:  RIGHT ankle deformity post fall EXAM: RIGHT ANKLE - 2 VIEW COMPARISON:  None FINDINGS: Osseous demineralization. Oblique fracture distal RIGHT fibula, laterally displaced with overriding and apex medial angulation. Transverse fracture medial malleolus displaced laterally. Probable small posterior malleolar fracture. Marked lateral subluxation versus dislocation of tibiotalar joint. No additional  fracture or bone destruction. Associated soft tissue swelling. IMPRESSION: Displaced trimalleolar fractures of the RIGHT ankle with marked lateral subluxation versus dislocation of the talus at the tibiotalar joint. Electronically Signed   By: Lavonia Dana M.D.   On: 11/01/2020 11:02   CT HEAD WO CONTRAST (5MM)  Result Date: 11/01/2020 CLINICAL DATA:  Fall EXAM: CT HEAD WITHOUT CONTRAST CT CERVICAL SPINE WITHOUT CONTRAST TECHNIQUE: Multidetector CT imaging of the head and cervical spine was performed following the standard protocol without intravenous contrast. Multiplanar CT image reconstructions of the cervical spine were also generated. COMPARISON:  None. FINDINGS: CT HEAD FINDINGS Brain: No evidence of acute infarction, hemorrhage, hydrocephalus, extra-axial collection or mass lesion/mass effect. Periventricular and deep white matter hypodensity. Mild, global cerebral volume loss. Vascular: No hyperdense vessel or unexpected calcification. Skull: Normal. Negative for fracture or focal lesion. Sinuses/Orbits: No acute finding. Other: None. CT CERVICAL SPINE FINDINGS Alignment: Normal. Skull base and vertebrae: No acute fracture. No primary bone lesion or focal pathologic process. Soft tissues and spinal canal: No prevertebral fluid or swelling. No visible canal hematoma. Disc levels: Mild multilevel disc space height loss and osteophytosis. Upper chest: Negative. Other: None. IMPRESSION: 1. No acute intracranial pathology. Small-vessel white matter disease and mild, global cerebral volume loss. 2. No fracture or static subluxation of the cervical spine. 3. Mild multilevel disc space height loss and osteophytosis. Electronically Signed   By: Eddie Candle M.D.   On: 11/01/2020 12:23   CT Cervical Spine Wo Contrast  Result Date: 11/01/2020 CLINICAL DATA:  Fall EXAM: CT HEAD WITHOUT CONTRAST CT CERVICAL SPINE WITHOUT CONTRAST TECHNIQUE: Multidetector CT imaging of the head and cervical spine was performed  following the standard protocol without intravenous contrast. Multiplanar CT image reconstructions of the cervical spine were also generated. COMPARISON:  None. FINDINGS: CT HEAD FINDINGS Brain: No evidence of acute infarction, hemorrhage, hydrocephalus, extra-axial collection or mass lesion/mass effect. Periventricular and deep white matter hypodensity. Mild, global cerebral volume loss. Vascular: No hyperdense vessel or unexpected calcification. Skull: Normal. Negative for fracture or focal lesion. Sinuses/Orbits: No acute finding. Other: None. CT CERVICAL SPINE FINDINGS Alignment: Normal. Skull base and vertebrae: No acute fracture. No primary bone lesion or focal pathologic process. Soft tissues and spinal canal: No prevertebral fluid or swelling. No visible canal hematoma. Disc levels: Mild multilevel disc space height loss and osteophytosis. Upper chest: Negative. Other: None. IMPRESSION: 1. No acute intracranial pathology. Small-vessel white matter disease and mild, global cerebral volume loss. 2. No fracture or static subluxation of the cervical spine. 3. Mild multilevel disc space height loss and osteophytosis. Electronically Signed   By: Eddie Candle M.D.   On: 11/01/2020 12:23     Assessment and Plan:   Mechanical fall with ankle fracture, asked to provide pre-operative cardiovascular risk stratification: Does not report any chest pain or dyspnea, but difficult to assess in setting of her dementia.  Limited functional capacity.  RCRI score 3 (CAD, CHF, CVA), indicating >11% indicating high risk of CV complications given comorbidities - also has likely developed progressive CAD over the past several years as evidenced  by decline in LVEF with WMA by echo 09/2019, but patient has been treated conservatively as she has wished to avoid procedures.  She is home hospice. -Discussed extensively with patient's daughter, who is a Marine scientist.  Discussed that patient is moderate to high risk surgical candidate.   However daughter states that she has limited mobility in her left lower extremity since her stroke and if she lost mobility in her right lower extremity as well she would be essentially immobile and quality of life significantly reduced.  She recognizes the risk of surgery but would like to proceed.  We will repeat an echocardiogram to evaluate for progression of her aortic stenosis, but no other cardiac work-up planned  CAD s/p CABG 2000, with abnormal nuc 2015 and decline in EF 09/2019 suggestive of interval MI, treated medically, on chronic Plavix.  We will hold Plavix given planned surgery and start aspirin 81 mg daily  Chronic combined CHF.  Appears euvolemic, can continue home losartan, toprol XL, Lasix  Permanent atrial fibrillation, rate controlled, on chronic warfarin.  INR 2.3, warfarin being held for surgery.  Can bridge with heparin once INR less than 2.0  Hx of CVA Essential HTN Dementia  Risk Assessment/Risk Scores:      New York Heart Association (NYHA) Functional Class difficult to discern due to dementia  CHA2DS2-VASc Score = 8   This indicates a 10.8% annual risk of stroke. The patient's score is based upon: CHF History: 1 HTN History: 1 Diabetes History: 0 Stroke History: 2 Vascular Disease History: 1 Age Score: 2 Gender Score: 1     For questions or updates, please contact [place] Please consult [email or web address] for contact info under    Signed, Charlie Pitter, PA-C  11/01/2020 12:54 PM  Patient seen and examined.  Agree with above documentation.  Ms. Rayfield is an 86 year old female with history of CAD status post CABG in 2000, chronic atrial fibrillation, chronic combined systolic and diastolic heart failure (EF 35 to 40% on 04/2019), CKD, COPD, tobacco use, hypertension, CVA, dementia, hypothyroidism who is being seen today for preop evaluation at the request of Dr. Jacqulynn Cadet.  Most recent ischemic evaluation was 03/2013.  Underwent Lexiscan Myoview, which showed  inferior scar with peri-infarct ischemia.  Plan was for medical management.  Most recent echocardiogram 09/14/2019 showed EF 35 to 40% (down from 60 to 65% in 01/2017) with regional wall motion abnormalities, normal RV function, mild to moderate aortic stenosis.  She was managed medically.  She presented today with mechanical fall.  X-rays indicated ankle fracture.  Plan is for ORIF once INR normalizes, likely next week.  Initial vital signs notable for BP 147/86, SPO2 92% on room air, pulse 68.  Labs notable for creatinine 0.9, hemoglobin 12.9, platelets 182, INR 2.3.  EKG shows atrial fibrillation, rate 64, nonspecific T wave flattening.  On exam, patient is alert, irregular, normal rate, 3 out of 6 systolic murmur, lungs CTAB, no lower extremity edema  Discussed extensively with patient's daughter, who is a Marine scientist.  Discussed that patient is moderate to high risk surgical candidate.  However daughter states that she has limited mobility in her left lower extremity since her stroke and if she lost mobility in her right lower extremity as well she would be essentially immobile and quality of life significantly reduced.  She recognizes the risk of surgery but would like to proceed.  We will repeat an echocardiogram to evaluate for progression of her aortic stenosis, but no other cardiac work-up planned.  We will hold her warfarin and Plavix.  Can start heparin drip once INR less than 2.0.  We will start aspirin 81 mg daily.  Donato Heinz, MD

## 2020-11-01 NOTE — ED Notes (Signed)
[facility] present to transport patient. Verbal report given to [facility], RN at [facility] to be sure she is ready to receive patient.

## 2020-11-01 NOTE — Progress Notes (Signed)
PHARMACIST - PHYSICIAN ORDER COMMUNICATION  CONCERNING: P&T Medication Policy on Herbal Medications  DESCRIPTION:  This patient's order for: Zinc-vitamin C 15-60 mg has been noted.  This product(s) is classified as an "herbal" or natural product due to low concentrations of product (15-60 mg). Due to a lack of definitive safety studies or FDA approval, nonstandard manufacturing practices, plus the potential risk of unknown drug-drug interactions while on inpatient medications, the Pharmacy and Therapeutics Committee does not permit the use of "herbal" or natural products of this type within [facility].   ACTION TAKEN: The pharmacy department is unable to verify this order at this time and your patient has been informed of this safety policy. Please reevaluate patient's clinical condition at discharge and address if the herbal or natural product(s) should be resumed at that time.

## 2020-11-01 NOTE — H&P (Signed)
History and Physical    DOA: 11/01/2020  PCP: Antony Contras, MD  Patient coming from: home  Chief Complaint: fall, ankle pain   HPI: Amber [name] is a 86 y.o. female with history h/o hypertension, hyperlipidemia, CAD s/p CABG, chronic atrial fibrillation on Coumadin, CVA in 2018 with residual left-sided weakness, COPD (FEV1 66%) not on home O2, combined systolic/diastolic CHF for which she follows cardiology, dementia/depression presents from home after a fall.  Daughter, [name] who is an Therapist, sports at [employer], is at bedside and provides most of the history.  Patient apparently has longstanding left foot contractures and ambulating difficulties due to which she walks with left lower extremity brace and walker.  She apparently is quite functional and lives alone at home but daughter lives next door-[name] and her sister take turns to stay with patient at nights. Patient recently had heart failure exacerbation for which she refused to go to the hospital and was managed as outpatient with escalated Lasix dosages.  During this time after discussion with PCP and primary cardiology, daughter initiated hospice services for her mother.  Patient pleasantly confused at baseline but is quite communicative.  Patient apparently goes out for dinners with family quite frequently.  Earlier today, patient apparently was walking at home when she bent down to pick up something and lost her balance, per daughter likely twisted her ankle as she was found sitting on her buttocks. ED course: Afebrile, heart rate 47-123, blood pressure elevated at systolic 299M to 426S,?  Initially hypoxic (88% documented but according to daughter likely after pain medication) and placed on 3 L nasal cannula.  When I was in the room, patient was saturating 100% and continued to saturate around 95% without O2.  Work-up in the ED revealed INR 2.3, x-ray showed trimalleolar right ankle fractures which was reduced by EDP-subsequently evaluated by  orthopedics and recommended admission to [facility] for definitive management by Dr. Doreatha Martin next week.  Patient quiet cheerful during my evaluation and does not report pain unless asked about it.  She is pleasantly confused    Review of Systems: As per HPI, otherwise review of systems negative.    Past Medical History:  Diagnosis Date   Abnormality of gait 08/02/2014   Cerebrovascular disease 05/21/2016   Cervical spinal stenosis    Chronic atrial fibrillation (HCC)    a. chronic/rate controlled;  b. chronic coumadin.   Chronic combined systolic and diastolic CHF (congestive heart failure) (HCC)    CKD (chronic kidney disease), stage III (HCC)    COPD with emphysema (HCC)    PFT 05/02/10>>FEV1 1.35(62%), FEV1% 66, DLCO 75%   Coronary artery disease    a. s/p CABG;  b. Abnormal nuc 2015 - managed medically.   CVA (cerebral vascular accident) ([place])    left sided weakness   Depression    Fibromyalgia    GERD (gastroesophageal reflux disease)    pepcid 2-3 times per week   Gout    Hemiparesis and alteration of sensations as late effects of stroke ([place]) 01/17/2015   History of melanoma    squamous cell, melanoma   Hyperlipidemia    a. statin intolerant, not felt to be candidate for PCSK9 due to chronically elevated CK levels.   Hypertension    Hypertensive heart disease    Hypothyroidism    Insomnia    Memory change 01/23/2014   Nasal polyposis    Osteoarthritis    Osteoporosis    Pneumonia    1990  Tobacco abuse     Past Surgical History:  Procedure Laterality Date   ABDOMINAL HYSTERECTOMY     BREAST LUMPECTOMY  1980s   Benign lesion - right   carpel tunnel     right   CORONARY ARTERY BYPASS GRAFT  2000   EYE SURGERY     bilateral cataracts   IR GENERIC HISTORICAL  03/31/2016   IR RADIOLOGIST EVAL & MGMT 03/31/2016 [facility]INTERV RAD   RADIOLOGY WITH ANESTHESIA  12/24/2011   Procedure: RADIOLOGY WITH ANESTHESIA;  Surgeon: Medication Radiologist, MD;  Location: [place];  Service: Radiology;  Laterality: N/A;  Extra Cranial Vascular Stent   RADIOLOGY WITH ANESTHESIA N/A 07/19/2014   Procedure: ANGIOPLASTY;  Surgeon: Luanne Bras, MD;  Location: [place];  Service: Radiology;  Laterality: N/A;   RADIOLOGY WITH ANESTHESIA N/A 07/27/2014   Procedure: ANGIOPLASTY;  Surgeon: Luanne Bras, MD;  Location: [place];  Service: Radiology;  Laterality: N/A;   TONSILLECTOMY      Social history:  reports that she quit smoking about 43 years ago. Her smoking use included cigarettes. She has a 10.00 pack-year smoking history. She has never used smokeless tobacco. She reports that she does not drink alcohol and does not use drugs.   Allergies  Allergen Reactions   Ezetimibe Other (See Comments)    Myalgia   Welchol [Colesevelam Hcl] Other (See Comments)    Muscle aches   Adhesive [Tape] Itching, Rash and Other (See Comments)    Burning    Ceclor [Cefaclor] Other (See Comments)    Unknown allergic reaction   Elastic Bandages & [Zinc] Rash and Other (See Comments)    Turns red on the areas it touches   Latex Itching, Rash and Other (See Comments)    Burning    Lipitor [Atorvastatin Calcium] Other (See Comments)    Increased fibromyalgia pain   Mevacor [Lovastatin] Other (See Comments)    Increased fibromyalgia pain   Pravachol Other (See Comments)    Increased fibromyalgia pain   Vasotec Other (See Comments)    Unknown allergic reaction    Family History  Problem Relation Age of Onset   Stroke Mother    CAD Father    Diabetes type II Brother    CAD Brother       Prior to Admission medications   Medication Sig Start Date End Date Taking? Authorizing Provider  allopurinol (ZYLOPRIM) 300 MG tablet Take 300 mg by mouth at bedtime.    Yes [provider]  amLODipine (NORVASC) 5 MG tablet TAKE 1 TABLET BY MOUTH EVERY DAY Patient taking differently: Take 5 mg by mouth at bedtime. 08/08/20  Yes Belva Crome, MD  citalopram (CELEXA) 20 MG  tablet TAKE 1 TABLET BY MOUTH TWICE A DAY Patient taking differently: Take 20 mg by mouth at bedtime. 11/09/14  Yes Darlin Coco, MD  clopidogrel (PLAVIX) 75 MG tablet TAKE 1 TABLET (75 MG TOTAL) BY MOUTH DAILY. 01/02/20  Yes Belva Crome, MD  diphenoxylate-atropine (LOMOTIL) 2.5-0.025 MG tablet Take 1 tablet by mouth every Monday, Wednesday, and Friday.   Yes [provider]  donepezil (ARICEPT) 5 MG tablet Take 1 tablet (5 mg total) by mouth daily. 04/18/20  Yes Suzzanne Cloud, NP  ergocalciferol (VITAMIN D2) 50000 UNITS capsule Take 50,000 Units by mouth 2 (two) times a week. Tuesday and Friday   Yes [provider]  furosemide (LASIX) 80 MG tablet Take 1 tablet (80 mg total) by mouth daily. 11/10/19  Yes Tamala Julian,  Lynnell Dike, MD  ipratropium-albuterol (DUONEB) 0.5-2.5 (3) MG/3ML SOLN Take 3 mLs by nebulization every 4 (four) hours as needed (Use at home). Patient taking differently: Take 3 mLs by nebulization every 4 (four) hours as needed (wheezing/shortness of breath). 08/04/19  Yes Mariel Aloe, MD  levothyroxine (SYNTHROID, LEVOTHROID) 25 MCG tablet Take 25 mcg by mouth every morning.    Yes [provider]  losartan (COZAAR) 25 MG tablet Take 1 tablet (25 mg total) by mouth daily. 09/18/20  Yes Jettie Booze, MD  memantine (NAMENDA) 10 MG tablet Take 1 tablet (10 mg total) by mouth 2 (two) times daily. 04/18/20  Yes Suzzanne Cloud, NP  metoprolol succinate (TOPROL-XL) 25 MG 24 hr tablet TAKE 1 TABLET BY MOUTH EVERY DAY Patient taking differently: Take 25 mg by mouth daily. 09/18/20  Yes Jettie Booze, MD  nitroGLYCERIN (NITROSTAT) 0.4 MG SL tablet Place 1 tablet (0.4 mg total) under the tongue every 5 (five) minutes as needed for chest pain. 09/30/19  Yes Belva Crome, MD  potassium chloride SA (KLOR-CON M20) 20 MEQ tablet TAKE 1 TABLET EVERY OTHER DAY, ALTERNATING WITH 2 TABLETS EVERY OTHER DAY Patient taking differently: Take 20-40 mEq by mouth See admin  instructions. Alternate 53mEq daily and 103mEq daily 09/18/20  Yes Jettie Booze, MD  warfarin (COUMADIN) 5 MG tablet TAKE AS DIRECTED BY COUMADIN CLINIC Patient taking differently: Take 5-7.5 mg by mouth See admin instructions. 7.5mg  on Monday nights, and 5mg  every other night of the week. 08/31/20  Yes Belva Crome, MD  ZINC-VITAMIN C PO Take 1 tablet by mouth every evening.   Yes [provider]    Physical Exam: Vitals:   11/01/20 1300 11/01/20 1400 11/01/20 1500 11/01/20 1504  BP: (!) 166/84 (!) 160/79 (!) 142/74   Pulse: 77 82 100   Resp: 16 15 13    Temp:      TempSrc:      SpO2: 98% 92% (!) 88% 98%  Weight:      Height:        Constitutional: NAD, calm, comfortable  Eyes: PERRL, lids and conjunctivae normal ENMT: Mucous membranes are moist. Posterior pharynx clear of any exudate or lesions.Normal dentition.  Neck: normal, supple, no masses, no thyromegaly Respiratory: clear to auscultation bilaterally, no wheezing, no crackles. Normal respiratory effort. No accessory muscle use.  Cardiovascular: Irregular rhythm, loud systolic ejection murmur at aortic area, No extremity edema. 2+ pedal pulses. No carotid bruits.  Abdomen: Has mild distention (?  Ascites) , no tenderness, no masses palpated. No hepatosplenomegaly. Bowel sounds positive.  Musculoskeletal: no clubbing / cyanosis.  Chronic left ankle joint deformity, now has a splint along the right lower extremity. Normal muscle tone.  Neurologic: CN 2-12 grossly intact.  Ataxia, memory issues and mild left-sided weakness from old stroke.  No new focal deficits.   Psychiatric: Pleasantly confused, dementia at baseline with impaired insight and judgment.   Normal mood.  Cheerful during conversation SKIN/catheters: no rashes, lesions, ulcers. No induration  Labs on Admission: I have personally reviewed following labs and imaging studies  CBC: Recent Labs  Lab 11/01/20 1024  WBC 8.0  HGB 12.9  HCT 40.5  MCV  94.2  PLT 834   Basic Metabolic Panel: Recent Labs  Lab 11/01/20 1024  NA 141  K 3.8  CL 101  CO2 29  GLUCOSE 134*  BUN 14  CREATININE 0.90  CALCIUM 9.1   GFR: Estimated Creatinine Clearance: 41.9 mL/min (by  C-G formula based on SCr of 0.9 mg/dL). Recent Labs  Lab 11/01/20 1024  WBC 8.0   Liver Function Tests: No results for input(s): AST, ALT, ALKPHOS, BILITOT, PROT, ALBUMIN in the last 168 hours. No results for input(s): LIPASE, AMYLASE in the last 168 hours. No results for input(s): AMMONIA in the last 168 hours. Coagulation Profile: Recent Labs  Lab 11/01/20 1024  INR 2.3*   Cardiac Enzymes: No results for input(s): CKTOTAL, CKMB, CKMBINDEX, TROPONINI in the last 168 hours. BNP (last 3 results) Recent Labs    09/06/20 1040  PROBNP 5,862*   HbA1C: No results for input(s): HGBA1C in the last 72 hours. CBG: No results for input(s): GLUCAP in the last 168 hours. Lipid Profile: No results for input(s): CHOL, HDL, LDLCALC, TRIG, CHOLHDL, LDLDIRECT in the last 72 hours. Thyroid Function Tests: No results for input(s): TSH, T4TOTAL, FREET4, T3FREE, THYROIDAB in the last 72 hours. Anemia Panel: No results for input(s): VITAMINB12, FOLATE, FERRITIN, TIBC, IRON, RETICCTPCT in the last 72 hours. Urine analysis:    Component Value Date/Time   COLORURINE YELLOW 11/01/2020 1006   APPEARANCEUR CLEAR 11/01/2020 1006   LABSPEC 1.015 11/01/2020 1006   PHURINE 6.0 11/01/2020 1006   GLUCOSEU NEGATIVE 11/01/2020 1006   GLUCOSEU NEGATIVE 02/28/2013 1235   HGBUR NEGATIVE 11/01/2020 1006   BILIRUBINUR NEGATIVE 11/01/2020 1006   [place] 11/01/2020 1006   PROTEINUR NEGATIVE 11/01/2020 1006   UROBILINOGEN 0.2 02/28/2013 1235   NITRITE NEGATIVE 11/01/2020 1006   LEUKOCYTESUR NEGATIVE 11/01/2020 1006    Radiological Exams on Admission: Personally reviewed  DG Ankle 2 Views Right  Result Date: 11/01/2020 CLINICAL DATA:  Postreduction right ankle fracture  dislocation EXAM: RIGHT ANKLE - 2 VIEW COMPARISON:  11/01/2020 right ankle radiographs FINDINGS: External cast obscures fine bone detail. Comminuted medial malleolus fracture without significant residual displacement. Comminuted distal right fibular metaphysis fracture with minimal residual 2 mm lateral displacement of the dominant distal fracture fragment. Probable nondisplaced posterior malleolar fracture in the posterior distal right tibia. Persistent mild 3-4 mm lateral subluxation of the talus at the ankle mortise, significantly improved. No focal osseous lesions. Mild degenerative changes in the dorsal tarsal joints. No radiopaque foreign bodies. Diffuse right ankle soft tissue swelling. IMPRESSION: Near-anatomic alignment of trimalleolar right ankle fractures as detailed. Persistent mild 3-4 mm lateral subluxation of the talus at the ankle mortise, significantly improved. Electronically Signed   By: Ilona Sorrel M.D.   On: 11/01/2020 12:05   DG Ankle 2 Views Right  Result Date: 11/01/2020 CLINICAL DATA:  RIGHT ankle deformity post fall EXAM: RIGHT ANKLE - 2 VIEW COMPARISON:  None FINDINGS: Osseous demineralization. Oblique fracture distal RIGHT fibula, laterally displaced with overriding and apex medial angulation. Transverse fracture medial malleolus displaced laterally. Probable small posterior malleolar fracture. Marked lateral subluxation versus dislocation of tibiotalar joint. No additional fracture or bone destruction. Associated soft tissue swelling. IMPRESSION: Displaced trimalleolar fractures of the RIGHT ankle with marked lateral subluxation versus dislocation of the talus at the tibiotalar joint. Electronically Signed   By: Lavonia Dana M.D.   On: 11/01/2020 11:02   CT HEAD WO CONTRAST (5MM)  Result Date: 11/01/2020 CLINICAL DATA:  Fall EXAM: CT HEAD WITHOUT CONTRAST CT CERVICAL SPINE WITHOUT CONTRAST TECHNIQUE: Multidetector CT imaging of the head and cervical spine was performed following  the standard protocol without intravenous contrast. Multiplanar CT image reconstructions of the cervical spine were also generated. COMPARISON:  None. FINDINGS: CT HEAD FINDINGS Brain: No evidence of acute infarction, hemorrhage,  hydrocephalus, extra-axial collection or mass lesion/mass effect. Periventricular and deep white matter hypodensity. Mild, global cerebral volume loss. Vascular: No hyperdense vessel or unexpected calcification. Skull: Normal. Negative for fracture or focal lesion. Sinuses/Orbits: No acute finding. Other: None. CT CERVICAL SPINE FINDINGS Alignment: Normal. Skull base and vertebrae: No acute fracture. No primary bone lesion or focal pathologic process. Soft tissues and spinal canal: No prevertebral fluid or swelling. No visible canal hematoma. Disc levels: Mild multilevel disc space height loss and osteophytosis. Upper chest: Negative. Other: None. IMPRESSION: 1. No acute intracranial pathology. Small-vessel white matter disease and mild, global cerebral volume loss. 2. No fracture or static subluxation of the cervical spine. 3. Mild multilevel disc space height loss and osteophytosis. Electronically Signed   By: Eddie Candle M.D.   On: 11/01/2020 12:23   CT Cervical Spine Wo Contrast  Result Date: 11/01/2020 CLINICAL DATA:  Fall EXAM: CT HEAD WITHOUT CONTRAST CT CERVICAL SPINE WITHOUT CONTRAST TECHNIQUE: Multidetector CT imaging of the head and cervical spine was performed following the standard protocol without intravenous contrast. Multiplanar CT image reconstructions of the cervical spine were also generated. COMPARISON:  None. FINDINGS: CT HEAD FINDINGS Brain: No evidence of acute infarction, hemorrhage, hydrocephalus, extra-axial collection or mass lesion/mass effect. Periventricular and deep white matter hypodensity. Mild, global cerebral volume loss. Vascular: No hyperdense vessel or unexpected calcification. Skull: Normal. Negative for fracture or focal lesion. Sinuses/Orbits:  No acute finding. Other: None. CT CERVICAL SPINE FINDINGS Alignment: Normal. Skull base and vertebrae: No acute fracture. No primary bone lesion or focal pathologic process. Soft tissues and spinal canal: No prevertebral fluid or swelling. No visible canal hematoma. Disc levels: Mild multilevel disc space height loss and osteophytosis. Upper chest: Negative. Other: None. IMPRESSION: 1. No acute intracranial pathology. Small-vessel white matter disease and mild, global cerebral volume loss. 2. No fracture or static subluxation of the cervical spine. 3. Mild multilevel disc space height loss and osteophytosis. Electronically Signed   By: Eddie Candle M.D.   On: 11/01/2020 12:23    EKG: Independently reviewed.  Atrial fibrillation     Assessment and Plan:   Principal Problem:   Ankle fracture Active Problems:   Atrial fibrillation (HCC)   Chronic combined systolic and diastolic CHF (congestive heart failure) (HCC)   Dyslipidemia   Depression   Hypothyroidism   Coronary artery disease involving coronary bypass graft of native heart with angina pectoris (HCC)   COPD (chronic obstructive pulmonary disease) (HCC)   Hemiparesis and alteration of sensations as late effects of stroke ([place])   Cerebrovascular disease   Asthma-COPD overlap syndrome ([place])   Dementia ([place])    1.Right trimalleolar ankle fracture: Reduced in the ED and now placed in a splint.  Seen by orthopedics, Coumadin held and plan for definitive management/ORIF probably next week.  Patient to be admitted to [facility] per orthopedics.  Supportive management with analgesics as needed.  2.  History of ischemic CVA in 2018 with residual left-sided weakness, noted to be on Coumadin as well as?  Plavix as outpatient.  Holding both in anticipation of surgical intervention.  Okay to continue aspirin for now as discussed with cardiology/orthopedics  3. Combined systolic/diastolic CHF: Appears overall compensated.  Patient initially diagnosed  with diastolic CHF and most recent echo showed wall motion abnormalities as well as reduced EF at 35 to 40%.  Patient expressed wish for comfort/nonaggressive interventions and did not want to be hospitalized at which time daughter decided on hospice services for patient.  Currently patient  does not have any leg swellings but abdomen noted to be somewhat distended-according to daughter she usually develops abdominal swelling of pulmonary edema due to CHF.  Can check BNP.  4.  Asthma/COPD (FEV1 66%): Not on home O2.  Stable currently on room air.  Was transiently hypoxic after receiving sedation for reduction in the ED.  Pulmonary toilet in the perioperative setting, incentive spirometry ordered.  5.  CAD, chronic atrial fibrillation, aortic stenosis: Discussed with cardiology who recommended to resume home medications.  Okay to hold Coumadin and discontinue Plavix, recommended aspirin for maintenance therapy as of now.  Patient does have operative risk given multiple comorbidities and advanced age but daughter understands and wishes for patient to undergo procedure for quality of life as she is quiet functional at baseline  6.  Hypertension, hyperlipidemia, hypothyroidism: Resume home medications.  7.  Dementia/depression: Resume home medications.  Patient pleasantly confused at this time.  In a cheerful mood today.  8.  Goals of care: Patient currently receiving home hospice services from [facility].  She apparently has refused hospitalizations and aggressive care on multiple occasions in the past.  Daughter comfortable with hospice decision and did confirm DNR status.  She would like to revoke DNR status during surgery and resume hospice services upon discharge.  She would like patient to undergo ankle surgery for quality of life and pain relief.  DVT prophylaxis: Therapeutic INR currently.  Cannot tolerate SCDs, ordered TED hose.  COVID screen: Negative  Code Status: DNR   .Health care proxy  would be her daughters  Patient/Family Communication: Discussed with patient and all questions answered to satisfaction.  Consults called: Cardiology, orthopedics Admission status :I certify that at the point of admission it is my clinical judgment that the patient will require inpatient hospital care spanning beyond 2 midnights from the point of admission due to high intensity of service and high frequency of surveillance required.Inpatient status is judged to be reasonable and necessary in order to provide the required intensity of service to ensure the patient's safety. The patient's presenting symptoms, physical exam findings, and initial radiographic and laboratory data in the context of their chronic comorbidities is felt to place them at high risk for further clinical deterioration. The following factors support the patient status of inpatient : Ankle fracture in elderly patient who is high risk for surgery needing Coumadin reversal and definitive fracture management early next week.     Guilford Shi MD [facility] Pager in [place]  If 7PM-7AM, please contact night-coverage [email or web address]   11/01/2020, 3:43 PM

## 2020-11-01 NOTE — ED Notes (Signed)
[facility] called for transport. 

## 2020-11-01 NOTE — ED Notes (Signed)
Patient transported to CT 

## 2020-11-01 NOTE — ED Provider Notes (Signed)
[facility] DEPT Provider Note   CSN: [id number] Arrival date & time: 11/01/20  4315     History Chief Complaint  Patient presents with   Fall   Ankle Pain   Ankle Injury    Amber [name] is a 86 y.o. female.  HPI Level 5 caveat secondary to dementia for event.  Patient is unable to give me history 86 year old female history of CVA, chronic A. fib, on Coumadin, COPD, CHF presents today via EMS with report of fall.  Per EMS patient has an ankle deformity.  They state patient was at home where she lives with her daughter.  She is walking from 1 to another when she had a mechanical fall.  They report ankle deformity and no other injuries. Patient complains of pain in her right ankle but denies any other pain or injuries.    Past Medical History:  Diagnosis Date   Abnormality of gait 08/02/2014   Cerebrovascular disease 05/21/2016   Cervical spinal stenosis    Chronic atrial fibrillation (HCC)    a. chronic/rate controlled;  b. chronic coumadin.   Chronic diastolic CHF (congestive heart failure) ([place])    a. 02/2013 Echo: EF 55-60%, mild AI, mod dil LA.   CKD (chronic kidney disease), stage III (HCC)    COPD with emphysema (HCC)    PFT 05/02/10>>FEV1 1.35(62%), FEV1% 66, DLCO 75%   Coronary artery disease    a. s/p CABG;  b. Abnormal nuc 2015 - managed medically.   CVA (cerebral vascular accident) ([address])    left sided weakness   Depression    Fibromyalgia    GERD (gastroesophageal reflux disease)    pepcid 2-3 times per week   Gout    Hemiparesis and alteration of sensations as late effects of stroke ([place]) 01/17/2015   History of melanoma    squamous cell, melanoma   Hyperlipidemia    a. statin intolerant, not felt to be candidate for PCSK9 due to chronically elevated CK levels.   Hypertension    Hypertensive heart disease    Hypothyroidism    Insomnia    Memory change 01/23/2014   Nasal polyposis    Osteoarthritis    Osteoporosis     Pneumonia    1990   Tobacco abuse     Patient Active Problem List   Diagnosis Date Noted   Rhinovirus infection 08/04/2019   Aspiration pneumonitis ([place]) 08/03/2019   Acute respiratory failure with hypoxia ([place]) 08/03/2019   Hypoxia 07/23/2019   Sprain of ankle 05/18/2019   Foot callus 05/18/2019   Fall (on) (from) other stairs and steps, initial encounter 04/28/2019   Left rib fracture 04/28/2019   Compression fracture of body of thoracic vertebra ([place]) 04/28/2019   Acute pulmonary edema (HCC)    Palliative care encounter    Acute on chronic diastolic CHF (congestive heart failure) ([place]) 03/04/2017   Asthma-COPD overlap syndrome ([place]) 02/06/2017   Acute on chronic respiratory failure with hypoxia ([place]) 02/06/2017   Dementia ([place]) 02/06/2017   Encounter for therapeutic drug monitoring 10/20/2016   Cerebrovascular disease 05/21/2016   Hemiparesis and alteration of sensations as late effects of stroke ([place]) 01/17/2015   Memory change 01/23/2014   Steroid-induced psychosis 02/14/2013   Long term current use of anticoagulant therapy    Streptococcal pneumonia ([place]) 02/12/2013   Vertebral artery stenosis 12/18/2011   Fibromyalgia 05/10/2010   Depression 05/10/2010   Hypothyroidism 05/10/2010   Osteoporosis 05/10/2010   Coronary artery disease involving coronary bypass graft  of native heart with angina pectoris ([place]) 05/10/2010   Chronic diastolic heart failure ([place])    Dyslipidemia    Atrial fibrillation ([place]) 05/06/2010    Past Surgical History:  Procedure Laterality Date   ABDOMINAL HYSTERECTOMY     BREAST LUMPECTOMY  1980s   Benign lesion - right   carpel tunnel     right   CORONARY ARTERY BYPASS GRAFT  2000   EYE SURGERY     bilateral cataracts   IR GENERIC HISTORICAL  03/31/2016   IR RADIOLOGIST EVAL & MGMT 03/31/2016 [facility]-INTERV RAD   RADIOLOGY WITH ANESTHESIA  12/24/2011   Procedure: RADIOLOGY WITH ANESTHESIA;  Surgeon: Medication Radiologist, MD;  Location: [facility];   Service: Radiology;  Laterality: N/A;  Extra Cranial Vascular Stent   RADIOLOGY WITH ANESTHESIA N/A 07/19/2014   Procedure: ANGIOPLASTY;  Surgeon: Luanne Bras, MD;  Location: [facility];  Service: Radiology;  Laterality: N/A;   RADIOLOGY WITH ANESTHESIA N/A 07/27/2014   Procedure: ANGIOPLASTY;  Surgeon: Luanne Bras, MD;  Location: [facility];  Service: Radiology;  Laterality: N/A;   TONSILLECTOMY       OB History   No obstetric history on file.     Family History  Problem Relation Age of Onset   Stroke Mother    CAD Father    Diabetes type II Brother    CAD Brother     Social History   Tobacco Use   Smoking status: Former    Packs/day: 0.40    Years: 25.00    Pack years: 10.00    Types: Cigarettes    Quit date: 02/10/1977    Years since quitting: 43.7   Smokeless tobacco: Never  Vaping Use   Vaping Use: Never used  Substance Use Topics   Alcohol use: No   Drug use: No    Home Medications Prior to Admission medications   Medication Sig Start Date End Date Taking? Authorizing Provider  allopurinol (ZYLOPRIM) 300 MG tablet Take 300 mg by mouth at bedtime.     [provider]  amLODipine (NORVASC) 5 MG tablet TAKE 1 TABLET BY MOUTH EVERY DAY 08/08/20   Belva Crome, MD  citalopram (CELEXA) 20 MG tablet TAKE 1 TABLET BY MOUTH TWICE A DAY 11/09/14   Darlin Coco, MD  clopidogrel (PLAVIX) 75 MG tablet TAKE 1 TABLET (75 MG TOTAL) BY MOUTH DAILY. 01/02/20   Belva Crome, MD  diphenoxylate-atropine (LOMOTIL) 2.5-0.025 MG tablet Take 1 tablet by mouth every Monday, Wednesday, and Friday.    [provider]  donepezil (ARICEPT) 5 MG tablet Take 1 tablet (5 mg total) by mouth daily. 04/18/20   Suzzanne Cloud, NP  ergocalciferol (VITAMIN D2) 50000 UNITS capsule Take 50,000 Units by mouth 2 (two) times a week. Tuesday and Friday    [provider]  fluticasone (FLONASE) 50 MCG/ACT nasal spray Place 1 spray into both nostrils daily.  07/22/19   [provider]  furosemide (LASIX) 80 MG tablet Take 1 tablet (80 mg total) by mouth daily. 11/10/19   Belva Crome, MD  ipratropium-albuterol (DUONEB) 0.5-2.5 (3) MG/3ML SOLN Take 3 mLs by nebulization every 4 (four) hours as needed (Use at home). 08/04/19   Mariel Aloe, MD  levothyroxine (SYNTHROID, LEVOTHROID) 25 MCG tablet Take 25 mcg by mouth every morning.     [provider]  losartan (COZAAR) 25 MG tablet Take 1 tablet (25 mg total) by mouth daily. 09/18/20   Jettie Booze, MD  memantine (NAMENDA) 10 MG tablet Take 1 tablet (10 mg total) by mouth 2 (two) times daily. 04/18/20   Suzzanne Cloud, NP  metoprolol succinate (TOPROL-XL) 25 MG 24 hr tablet TAKE 1 TABLET BY MOUTH EVERY DAY 09/18/20   Jettie Booze, MD  nitroGLYCERIN (NITROSTAT) 0.4 MG SL tablet Place 1 tablet (0.4 mg total) under the tongue every 5 (five) minutes as needed for chest pain. 09/30/19   Belva Crome, MD  potassium chloride SA (KLOR-CON M20) 20 MEQ tablet TAKE 1 TABLET EVERY OTHER DAY, ALTERNATING WITH 2 TABLETS EVERY OTHER DAY 09/18/20   Jettie Booze, MD  warfarin (COUMADIN) 5 MG tablet TAKE AS DIRECTED BY COUMADIN CLINIC 08/31/20   Belva Crome, MD  ZINC-VITAMIN C PO Take 1 tablet by mouth every evening.    [provider]    Allergies    Ezetimibe, Welchol [colesevelam hcl], Adhesive [tape], Ceclor [cefaclor], Elastic bandages & [zinc], Latex, Lipitor [atorvastatin calcium], Mevacor [lovastatin], Pravachol, and Vasotec  Review of Systems   Review of Systems  Unable to perform ROS: Dementia   Physical Exam Updated Vital Signs BP (!) 147/86 (BP Location: Left Arm)   Pulse 68   Temp 97.8 F (36.6 C) (Oral)   Resp 18   Ht 1.651 m (5\' 5" )   Wt 64 kg   SpO2 92%   BMI 23.48 kg/m   Physical Exam Vitals and nursing note reviewed.  Constitutional:      Appearance: Normal appearance.  HENT:     Head: Normocephalic and atraumatic.     Right Ear: External ear normal.      Left Ear: External ear normal.     Nose: Nose normal.     Mouth/Throat:     Pharynx: Oropharynx is clear.  Eyes:     Extraocular Movements: Extraocular movements intact.     Pupils: Pupils are equal, round, and reactive to light.  Cardiovascular:     Rate and Rhythm: Normal rate. Rhythm irregular.  Pulmonary:     Effort: Pulmonary effort is normal.     Breath sounds: Normal breath sounds.  Abdominal:     General: Abdomen is flat. Bowel sounds are normal.     Palpations: Abdomen is soft.     Comments: No external signs of trauma  Musculoskeletal:     Cervical back: Normal range of motion.     Comments: Right ankle with deformity noted, no skin injury noted Toes are pink capillary refills less than 2 seconds Sensation is intact and patient is able to move toes There is no tenderness proximal to injury on the lower leg, knee, or hip. Back exam with no external signs of trauma No tenderness palpation over cervical, thoracic, or lumbar spine Pelvis is stable  Skin:    General: Skin is warm.     Capillary Refill: Capillary refill takes less than 2 seconds.  Neurological:     General: No focal deficit present.     Mental Status: She is alert.  Psychiatric:        Mood and Affect: Mood normal.    ED Results / Procedures / Treatments   Labs (all labs ordered are listed, but only abnormal results are displayed) Labs Reviewed - No data to display  EKG None  Radiology No results found.  Procedures .Ortho Injury Treatment  Date/Time: 11/01/2020 11:31 AM Performed by: Pattricia Boss, MD Authorized by: Pattricia Boss, MD   Consent:    Consent obtained:  Written  Consent given by:  Healthcare agent   Risks discussed:  Nerve damage, restricted joint movement and irreducible dislocation   Alternatives discussed:  Delayed treatmentInjury location: ankle Location details: right ankle Injury type: fracture-dislocation Pre-procedure distal perfusion: diminished Pre-procedure  neurological function: normal Pre-procedure range of motion: reduced  Anesthesia: Local anesthesia used: no Immobilization: splint Splint type: ankle stirrup Splint Applied by: Ortho Tech Supplies used: Ortho-Glass Post-procedure neurovascular assessment: post-procedure neurovascularly intact Post-procedure distal perfusion: normal Post-procedure neurological function: normal Post-procedure range of motion: improved   .Sedation  Date/Time: 11/01/2020 11:33 AM Performed by: Pattricia Boss, MD Authorized by: Pattricia Boss, MD   Consent:    Consent obtained:  Written   Consent given by:  Healthcare agent   Risks discussed:  Prolonged hypoxia resulting in organ damage, dysrhythmia, inadequate sedation and respiratory compromise necessitating ventilatory assistance and intubation Universal protocol:    Immediately prior to procedure, a time out was called: yes     Patient identity confirmed:  Arm band and verbally with patient Indications:    Procedure performed:  Dislocation reduction   Procedure necessitating sedation performed by:  Physician performing sedation Pre-sedation assessment:    Time since last food or drink:  8   ASA classification: class 3 - patient with severe systemic disease     Mouth opening:  2 finger widths   Thyromental distance:  3 finger widths   Mallampati score:  I - soft palate, uvula, fauces, pillars visible   Neck mobility: normal     Pre-sedation assessments completed and reviewed: airway patency and mental status   Immediate pre-procedure details:    Reassessment: Patient reassessed immediately prior to procedure     Reviewed: vital signs     Verified: bag valve mask available   Procedure details (see MAR for exact dosages):    Preoxygenation:  Nasal cannula   Sedation:  Midazolam   Intended level of sedation: moderate (conscious sedation)   Analgesia:  Fentanyl   Intra-procedure monitoring:  Blood pressure monitoring, continuous capnometry,  frequent LOC assessments, cardiac monitor and continuous pulse oximetry   Intra-procedure events: none     Total Provider sedation time (minutes):  25 Post-procedure details:    Post-sedation assessment completed:  11/01/2020 11:34 AM   Attendance: Constant attendance by certified staff until patient recovered     Recovery: Patient returned to pre-procedure baseline     Patient is stable for discharge or admission: yes     Procedure completion:  Tolerated well, no immediate complications   Medications Ordered in ED Medications - No data to display  ED Course  I have reviewed the triage vital signs and the nursing notes.  Pertinent labs & imaging results that were available during my care of the patient were reviewed by me and considered in my medical decision making (see chart for details).    MDM Rules/Calculators/A&P                           86 year old female on Coumadin with mechanical fall and right ankle fracture dislocation.  Daughter is power of attorney at bedside.  Patient has been on hospice care due to CHF, COPD, and aortic stenosis.  However, daughter wishes repair of fracture due to patient's ambulatory status.  Patient care discussed with Dr. Doreatha Martin.  He request admission to hospitalist for patient clearance in preparation for surgery.  He will contact Dr. Doran Durand, who is previously seen patient and inform us of where patient  should ultimately be admitted. Discussed with Dr. Andria Frames.  She will place admission orders once we know which facility.  She requested I speak with cardiology.  Cardiology consult placed Discussed with Angie, answering for [facility], cardiology will consult Ortho advises admit to [facility].  Dr. Andria Frames notified Final Clinical Impression(s) / ED Diagnoses Final diagnoses:  Fall, initial encounter  Closed fracture dislocation of right ankle, initial encounter    Rx / DC Orders ED Discharge Orders     None        Pattricia Boss, MD 11/01/20  1334

## 2020-11-02 ENCOUNTER — Inpatient Hospital Stay (HOSPITAL_COMMUNITY)

## 2020-11-02 DIAGNOSIS — I482 Chronic atrial fibrillation, unspecified: Secondary | ICD-10-CM

## 2020-11-02 DIAGNOSIS — I5042 Chronic combined systolic (congestive) and diastolic (congestive) heart failure: Secondary | ICD-10-CM | POA: Diagnosis not present

## 2020-11-02 DIAGNOSIS — S82891A Other fracture of right lower leg, initial encounter for closed fracture: Secondary | ICD-10-CM | POA: Diagnosis not present

## 2020-11-02 DIAGNOSIS — Z01810 Encounter for preprocedural cardiovascular examination: Secondary | ICD-10-CM | POA: Diagnosis not present

## 2020-11-02 LAB — BASIC METABOLIC PANEL
Anion gap: 9 (ref 5–15)
BUN: 13 mg/dL (ref 8–23)
CO2: 28 mmol/L (ref 22–32)
Calcium: 9 mg/dL (ref 8.9–10.3)
Chloride: 102 mmol/L (ref 98–111)
Creatinine, Ser: 0.95 mg/dL (ref 0.44–1.00)
GFR, Estimated: 59 mL/min — ABNORMAL LOW (ref >60)
Glucose, Bld: 133 mg/dL — ABNORMAL HIGH (ref 70–99)
Potassium: 3.4 mmol/L — ABNORMAL LOW (ref 3.5–5.1)
Sodium: 139 mmol/L (ref 135–145)

## 2020-11-02 LAB — CBC
HCT: 37.4 % (ref 36.0–46.0)
Hemoglobin: 12.3 g/dL (ref 12.0–15.0)
MCH: 30.2 pg (ref 26.0–34.0)
MCHC: 32.9 g/dL (ref 30.0–36.0)
MCV: 91.9 fL (ref 80.0–100.0)
Platelets: 178 10*3/uL (ref 150–400)
RBC: 4.07 MIL/uL (ref 3.87–5.11)
RDW: 14.9 % (ref 11.5–15.5)
WBC: 8 10*3/uL (ref 4.0–10.5)
nRBC: 0 % (ref 0.0–0.2)

## 2020-11-02 LAB — ECHOCARDIOGRAM COMPLETE
AR max vel: 0.76 cm2
AV Area VTI: 0.62 cm2
AV Area mean vel: 0.7 cm2
AV Mean grad: 21.8 mmHg
AV Peak grad: 37 mmHg
Ao pk vel: 3.04 m/s
Area-P 1/2: 3.35 cm2
Height: 65 in
MV VTI: 0.87 cm2
S' Lateral: 3.75 cm
Single Plane A4C EF: 59.3 %
Weight: 2257.51 oz

## 2020-11-02 LAB — PROTIME-INR
INR: 2.3 — ABNORMAL HIGH (ref 0.8–1.2)
Prothrombin Time: 25.7 seconds — ABNORMAL HIGH (ref 11.4–15.2)

## 2020-11-02 MED ORDER — PERFLUTREN LIPID MICROSPHERE
1.0000 mL | INTRAVENOUS | Status: AC | PRN
  Administered 2020-11-02: 2 mL via INTRAVENOUS
  Filled 2020-11-02: qty 10

## 2020-11-02 NOTE — Progress Notes (Addendum)
Amber [name] [phone] [facility]) Hospitalized Hospice Patient   Amber [name] is a current hospice patient with [facility] with a terminal diagnosis of CKD stage 3 with chronic combined CHF. EMS was initiated by family with report of fall, noted right ankle deformity. Patient was initially transported to [facility] ED and then later to [facility]. She is admitted with a right trimalleolar ankle fracture. This is a related admission per Dr. Karie Georges with [facility].  Met with patient and daughter at the bedside. Amber [name] is pleasantly confused, with no current complaints. Family has many questions about mobility post repair and are not quiet sure what the discharge plan will be. Provided support and answered questions related to hospice care.   V/S: 99.1 oral, 156/64, HR 80, RR 16, SPO2 95% on RA I&O: 480/200 Labs: BNP 233, INR 2.3 Diagnostics: right ankle - Near-anatomic alignment of trimalleolar right ankle fractures as detailed. Persistent mild 3-4 mm lateral subluxation of the talus at the ankle mortise, significantly improved IV/PRNs: versed 2 mg IV and fentanyl 25 mcg IV to set her ankle in the ED, fentanyl 50 mcg IV x 1 for pain, oxy IR 5 mg PO x 1  She remains inpatient appropriate as she is waiting for ORIF of her right ankle, requiring pain management, and waiting for her INR to drop to a safe level so she can have surgery.   1.Right trimalleolar ankle fracture: Reduced in the ED and now placed in a splint.  Seen by orthopedics, Coumadin held and plan for definitive management/ORIF probably next week.  Patient to be admitted to [facility] per orthopedics.  Supportive management with analgesics as needed.   2.  History of ischemic CVA in 2018 with residual left-sided weakness, noted to be on Coumadin as well as Plavix as outpatient.  Holding both in anticipation of surgical intervention.  Okay to continue aspirin for now as discussed with cardiology/orthopedics   3. Combined systolic/diastolic CHF:  Appears overall compensated.  Patient initially diagnosed with diastolic CHF and most recent echo showed wall motion abnormalities as well as reduced EF at 35 to 40%. Repeat echo today in anticipation for clearance for surgery next week.    4.  CAD, chronic atrial fibrillation, aortic stenosis: Discussed with cardiology who recommended to resume home medications.  Okay to hold Coumadin and discontinue Plavix, recommended aspirin for maintenance therapy as of now.  Patient does have operative risk given multiple comorbidities and advanced age but daughter understands and wishes for patient to undergo procedure for quality of life as she is quiet functional at baseline. Echo today to determine if aortic stenosis has worsened and to help anesthesia determine plan for OR.   Discharge planning: Ongoing. Pt lives at home with her husband. Her two daughters check on them frequently and are actively involved in their care. Ultimately, they would like for her to return home with hospice but that will depend on if she is able to be mobilized post surgery.  IDT: updated GOC: Clear. DNR. Surgery to repair fx to hopefully allow her to return back home. Family: Present and update on plan of care.   Transfer summary and med list placed on shadow chart.  Should pt require ambulance transport, please use [facility] as they contract this service for our active hospice patients.  Venia Carbon RN, BSN, [facility] Liaison

## 2020-11-02 NOTE — Progress Notes (Signed)
Progress Note  Patient Name: Amber [name] Date of Encounter: 11/02/2020  [facility] Cardiologist: Sinclair Grooms, MD   Subjective   Denies any chest pain or dyspnea  Inpatient Medications    Scheduled Meds:  allopurinol  300 mg Oral QHS   amLODipine  5 mg Oral QHS   aspirin  81 mg Oral Daily   citalopram  20 mg Oral QHS   diphenoxylate-atropine  1 tablet Oral Q M,W,F   donepezil  5 mg Oral Daily   fentaNYL (SUBLIMAZE) injection  50 mcg Intravenous Once   furosemide  80 mg Oral Daily   levothyroxine  25 mcg Oral Q0600   losartan  25 mg Oral Daily   memantine  10 mg Oral BID   metoprolol succinate  25 mg Oral Daily   midazolam  4 mg Intravenous Once   potassium chloride SA  20 mEq Oral Daily   Vitamin D (Ergocalciferol)  50,000 Units Oral Once per day on Tue Fri   Continuous Infusions:  PRN Meds: acetaminophen **OR** acetaminophen, bisacodyl, fentaNYL (SUBLIMAZE) injection, hydrALAZINE, ipratropium-albuterol, nitroGLYCERIN, ondansetron **OR** ondansetron (ZOFRAN) IV, oxyCODONE, polyethylene glycol   Vital Signs    Vitals:   11/01/20 1730 11/01/20 2111 11/02/20 0545 11/02/20 0926  BP: (!) 158/56 140/74 (!) 156/64 (!) 125/96  Pulse: 85 74 80 82  Resp: 20 17  19   Temp: 98.3 F (36.8 C) 98.4 F (36.9 C) 99.1 F (37.3 C)   TempSrc: Oral Oral Oral   SpO2: 94% 96% (!) 89% 95%  Weight:      Height:        Intake/Output Summary (Last 24 hours) at 11/02/2020 1719 Last data filed at 11/02/2020 0500 Gross per 24 hour  Intake 680 ml  Output 200 ml  Net 480 ml   Last 3 Weights 11/01/2020 10/05/2020 09/06/2020  Weight (lbs) 141 lb 1.5 oz 140 lb 137 lb  Weight (kg) 64 kg 63.504 kg 62.143 kg      Telemetry    Afib, rate controlled - Personally Reviewed  ECG    No new EKG - Personally Reviewed  Physical Exam   GEN: No acute distress.   Neck: No JVD Cardiac: Irregular, normal rate, 3 out of 6 systolic murmur Respiratory: Clear to auscultation  bilaterally. GI: Soft, nontender, non-distended  MS: No edema Neuro:  Nonfocal  Psych: Normal affect   Labs    High Sensitivity Troponin:  No results for input(s): TROPONINIHS in the last 720 hours.   Chemistry Recent Labs  Lab 11/01/20 1024 11/02/20 0053  NA 141 139  K 3.8 3.4*  CL 101 102  CO2 29 28  GLUCOSE 134* 133*  BUN 14 13  CREATININE 0.90 0.95  CALCIUM 9.1 9.0  GFRNONAA >60 59*  ANIONGAP 11 9    Lipids No results for input(s): CHOL, TRIG, HDL, LABVLDL, LDLCALC, CHOLHDL in the last 168 hours.  Hematology Recent Labs  Lab 11/01/20 1024 11/02/20 0053  WBC 8.0 8.0  RBC 4.30 4.07  HGB 12.9 12.3  HCT 40.5 37.4  MCV 94.2 91.9  MCH 30.0 30.2  MCHC 31.9 32.9  RDW 15.2 14.9  PLT 182 178   Thyroid No results for input(s): TSH, FREET4 in the last 168 hours.  BNP Recent Labs  Lab 11/01/20 1024  BNP 233.2*    DDimer No results for input(s): DDIMER in the last 168 hours.   Radiology    DG Ankle 2 Views Right  Result Date: 11/01/2020 CLINICAL  DATA:  Postreduction right ankle fracture dislocation EXAM: RIGHT ANKLE - 2 VIEW COMPARISON:  11/01/2020 right ankle radiographs FINDINGS: External cast obscures fine bone detail. Comminuted medial malleolus fracture without significant residual displacement. Comminuted distal right fibular metaphysis fracture with minimal residual 2 mm lateral displacement of the dominant distal fracture fragment. Probable nondisplaced posterior malleolar fracture in the posterior distal right tibia. Persistent mild 3-4 mm lateral subluxation of the talus at the ankle mortise, significantly improved. No focal osseous lesions. Mild degenerative changes in the dorsal tarsal joints. No radiopaque foreign bodies. Diffuse right ankle soft tissue swelling. IMPRESSION: Near-anatomic alignment of trimalleolar right ankle fractures as detailed. Persistent mild 3-4 mm lateral subluxation of the talus at the ankle mortise, significantly improved.  Electronically Signed   By: Ilona Sorrel M.D.   On: 11/01/2020 12:05   DG Ankle 2 Views Right  Result Date: 11/01/2020 CLINICAL DATA:  RIGHT ankle deformity post fall EXAM: RIGHT ANKLE - 2 VIEW COMPARISON:  None FINDINGS: Osseous demineralization. Oblique fracture distal RIGHT fibula, laterally displaced with overriding and apex medial angulation. Transverse fracture medial malleolus displaced laterally. Probable small posterior malleolar fracture. Marked lateral subluxation versus dislocation of tibiotalar joint. No additional fracture or bone destruction. Associated soft tissue swelling. IMPRESSION: Displaced trimalleolar fractures of the RIGHT ankle with marked lateral subluxation versus dislocation of the talus at the tibiotalar joint. Electronically Signed   By: Lavonia Dana M.D.   On: 11/01/2020 11:02   CT HEAD WO CONTRAST (5MM)  Result Date: 11/01/2020 CLINICAL DATA:  Fall EXAM: CT HEAD WITHOUT CONTRAST CT CERVICAL SPINE WITHOUT CONTRAST TECHNIQUE: Multidetector CT imaging of the head and cervical spine was performed following the standard protocol without intravenous contrast. Multiplanar CT image reconstructions of the cervical spine were also generated. COMPARISON:  None. FINDINGS: CT HEAD FINDINGS Brain: No evidence of acute infarction, hemorrhage, hydrocephalus, extra-axial collection or mass lesion/mass effect. Periventricular and deep white matter hypodensity. Mild, global cerebral volume loss. Vascular: No hyperdense vessel or unexpected calcification. Skull: Normal. Negative for fracture or focal lesion. Sinuses/Orbits: No acute finding. Other: None. CT CERVICAL SPINE FINDINGS Alignment: Normal. Skull base and vertebrae: No acute fracture. No primary bone lesion or focal pathologic process. Soft tissues and spinal canal: No prevertebral fluid or swelling. No visible canal hematoma. Disc levels: Mild multilevel disc space height loss and osteophytosis. Upper chest: Negative. Other: None.  IMPRESSION: 1. No acute intracranial pathology. Small-vessel white matter disease and mild, global cerebral volume loss. 2. No fracture or static subluxation of the cervical spine. 3. Mild multilevel disc space height loss and osteophytosis. Electronically Signed   By: Eddie Candle M.D.   On: 11/01/2020 12:23   CT Cervical Spine Wo Contrast  Result Date: 11/01/2020 CLINICAL DATA:  Fall EXAM: CT HEAD WITHOUT CONTRAST CT CERVICAL SPINE WITHOUT CONTRAST TECHNIQUE: Multidetector CT imaging of the head and cervical spine was performed following the standard protocol without intravenous contrast. Multiplanar CT image reconstructions of the cervical spine were also generated. COMPARISON:  None. FINDINGS: CT HEAD FINDINGS Brain: No evidence of acute infarction, hemorrhage, hydrocephalus, extra-axial collection or mass lesion/mass effect. Periventricular and deep white matter hypodensity. Mild, global cerebral volume loss. Vascular: No hyperdense vessel or unexpected calcification. Skull: Normal. Negative for fracture or focal lesion. Sinuses/Orbits: No acute finding. Other: None. CT CERVICAL SPINE FINDINGS Alignment: Normal. Skull base and vertebrae: No acute fracture. No primary bone lesion or focal pathologic process. Soft tissues and spinal canal: No prevertebral fluid or swelling. No visible canal hematoma.  Disc levels: Mild multilevel disc space height loss and osteophytosis. Upper chest: Negative. Other: None. IMPRESSION: 1. No acute intracranial pathology. Small-vessel white matter disease and mild, global cerebral volume loss. 2. No fracture or static subluxation of the cervical spine. 3. Mild multilevel disc space height loss and osteophytosis. Electronically Signed   By: Eddie Candle M.D.   On: 11/01/2020 12:23   ECHOCARDIOGRAM COMPLETE  Result Date: 11/02/2020    ECHOCARDIOGRAM REPORT   Patient Name:   Amber [name] Date of Exam: 11/02/2020 Medical Rec #:  [id number]      Height:       65.0 in Accession #:     [id number]     Weight:       141.1 lb Date of Birth:  01-01-1935      BSA:          1.706 m Patient Age:    64 years       BP:           125/96 mmHg Patient Gender: F              HR:           65 bpm. Exam Location:  Inpatient Procedure: 2D Echo, Cardiac Doppler, Color Doppler and Intracardiac            Opacification Agent Indications:    I50.40* Unspecified combined systolic (congestive) and diastolic                 (congestive) heart failure  History:        Patient has prior history of Echocardiogram examinations, most                 recent 09/14/2019. CHF, Abnormal ECG, COPD, Aortic Valve Disease,                 Arrythmias:Atrial Fibrillation, Signs/Symptoms:Shortness of                 Breath and Dyspnea; Risk Factors:Dyslipidemia.  Sonographer:    Roseanna Rainbow RDCS Referring Phys: [id number] [place]  Sonographer Comments: Technically difficult study due to poor echo windows. IMPRESSIONS  1. Left ventricular ejection fraction, by estimation, is 50 to 55%. The left ventricle has low normal function. The left ventricle demonstrates regional wall motion abnormalities (see scoring diagram/findings for description). Left ventricular diastolic  function could not be evaluated. There is mild hypokinesis of the left ventricular, basal-mid anteroseptal wall.  2. Right ventricular systolic function is normal. The right ventricular size is normal. There is normal pulmonary artery systolic pressure.  3. Left atrial size was moderately dilated.  4. Right atrial size was mild to moderately dilated.  5. The mitral valve is grossly normal. Trivial mitral valve regurgitation. Moderate to severe mitral annular calcification.  6. The aortic valve is calcified. There is moderate calcification of the aortic valve. There is moderate thickening of the aortic valve. Aortic valve regurgitation is not visualized. Moderate aortic valve stenosis. Aortic valve mean gradient measures 21.8 mmHg. Aortic valve Vmax measures 3.04  m/s.  7. The inferior vena cava is dilated in size with >50% respiratory variability, suggesting right atrial pressure of 8 mmHg. Comparison(s): Prior images reviewed side by side. Changes from prior study are noted. Wall motion abnormality at the basal anteroseptum is now only mild, previously severe. EF improved. FINDINGS  Left Ventricle: Left ventricular ejection fraction, by estimation, is 50 to 55%. The left ventricle has low normal function. The left ventricle  demonstrates regional wall motion abnormalities. Mild hypokinesis of the left ventricular, basal-mid anteroseptal wall. Definity contrast agent was given IV to delineate the left ventricular endocardial borders. The left ventricular internal cavity size was normal in size. There is no left ventricular hypertrophy. Left ventricular diastolic function could not be evaluated due to atrial fibrillation. Left ventricular diastolic function could not be evaluated. Right Ventricle: The right ventricular size is normal. Right vetricular wall thickness was not well visualized. Right ventricular systolic function is normal. There is normal pulmonary artery systolic pressure. The tricuspid regurgitant velocity is 1.85 m/s, and with an assumed right atrial pressure of 8 mmHg, the estimated right ventricular systolic pressure is 50.5 mmHg. Left Atrium: Left atrial size was moderately dilated. Right Atrium: Right atrial size was mild to moderately dilated. Pericardium: There is no evidence of pericardial effusion. Mitral Valve: The mitral valve is grossly normal. Moderate to severe mitral annular calcification. Trivial mitral valve regurgitation. MV peak gradient, 13.1 mmHg. The mean mitral valve gradient is 3.5 mmHg. Tricuspid Valve: The tricuspid valve is normal in structure. Tricuspid valve regurgitation is mild . No evidence of tricuspid stenosis. Aortic Valve: The aortic valve is calcified. There is moderate calcification of the aortic valve. There is moderate  thickening of the aortic valve. Aortic valve regurgitation is not visualized. Moderate aortic stenosis is present. Aortic valve mean gradient measures 21.8 mmHg. Aortic valve peak gradient measures 37.0 mmHg. Aortic valve area, by VTI measures 0.62 cm. Pulmonic Valve: The pulmonic valve was not well visualized. Pulmonic valve regurgitation is not visualized. Aorta: The aortic root, ascending aorta, aortic arch and descending aorta are all structurally normal, with no evidence of dilitation or obstruction. Venous: The inferior vena cava is dilated in size with greater than 50% respiratory variability, suggesting right atrial pressure of 8 mmHg. IAS/Shunts: The atrial septum is grossly normal.  LEFT VENTRICLE PLAX 2D LVIDd:         4.40 cm LVIDs:         3.75 cm LV PW:         1.00 cm LV IVS:        0.95 cm LVOT diam:     1.80 cm LV SV:         46 LV SV Index:   27 LVOT Area:     2.54 cm  LV Volumes (MOD) LV vol d, MOD A4C: 61.6 ml LV vol s, MOD A4C: 25.1 ml LV SV MOD A4C:     61.6 ml RIGHT VENTRICLE            IVC RV S prime:     9.46 cm/s  IVC diam: 2.30 cm TAPSE (M-mode): 1.7 cm LEFT ATRIUM             Index       RIGHT ATRIUM           Index LA diam:        4.30 cm 2.52 cm/m  RA Area:     16.50 cm LA Vol (A2C):   64.8 ml 37.99 ml/m RA Volume:   45.70 ml  26.80 ml/m LA Vol (A4C):   42.5 ml 24.92 ml/m LA Biplane Vol: 54.1 ml 31.72 ml/m  AORTIC VALVE AV Area (Vmax):    0.76 cm AV Area (Vmean):   0.70 cm AV Area (VTI):     0.62 cm AV Vmax:           304.20 cm/s AV Vmean:  220.200 cm/s AV VTI:            0.736 m AV Peak Grad:      37.0 mmHg AV Mean Grad:      21.8 mmHg LVOT Vmax:         91.00 cm/s LVOT Vmean:        60.800 cm/s LVOT VTI:          0.181 m LVOT/AV VTI ratio: 0.25  AORTA Ao Root diam: 2.70 cm Ao Asc diam:  3.00 cm MITRAL VALVE                TRICUSPID VALVE MV Area (PHT): 3.35 cm     TR Peak grad:   13.7 mmHg MV Area VTI:   0.87 cm     TR Vmax:        185.00 cm/s MV Peak grad:  13.1  mmHg MV Mean grad:  3.5 mmHg     SHUNTS MV Vmax:       1.81 m/s     Systemic VTI:  0.18 m MV Vmean:      83.0 cm/s    Systemic Diam: 1.80 cm MV Decel Time: 227 msec MV E velocity: 160.00 cm/s Buford Dresser MD Electronically signed by Buford Dresser MD Signature Date/Time: 11/02/2020/5:02:13 PM    Final     Cardiac Studies   TTE 11/02/20:  1. Left ventricular ejection fraction, by estimation, is 50 to 55%. The  left ventricle has low normal function. The left ventricle demonstrates  regional wall motion abnormalities (see scoring diagram/findings for  description). Left ventricular diastolic   function could not be evaluated. There is mild hypokinesis of the left  ventricular, basal-mid anteroseptal wall.   2. Right ventricular systolic function is normal. The right ventricular  size is normal. There is normal pulmonary artery systolic pressure.   3. Left atrial size was moderately dilated.   4. Right atrial size was mild to moderately dilated.   5. The mitral valve is grossly normal. Trivial mitral valve  regurgitation. Moderate to severe mitral annular calcification.   6. The aortic valve is calcified. There is moderate calcification of the  aortic valve. There is moderate thickening of the aortic valve. Aortic  valve regurgitation is not visualized. Moderate aortic valve stenosis.  Aortic valve mean gradient measures  21.8 mmHg. Aortic valve Vmax measures 3.04 m/s.   7. The inferior vena cava is dilated in size with >50% respiratory  variability, suggesting right atrial pressure of 8 mmHg.   Patient Profile     86 y.o. female with a hx of CAD s/p CABG 2000, chronic atrial fib, chronic combined CHF (EF 35-40% by echo 09/2019), reported CKD stage III (normal Cr here), hyperlipidemia (statin intolerant, not previously candidate for PCSK9 due to chronically elevated CK levels), suspected COPD, prior tobacco abuse, HTN with hypertensive heart disease, cerebrovascular disease,  dementia, CVA with left sided weakness, fibromyalgia, hypothyroidism, osteoporosis, falls who is being seen for preop evaluation  Assessment & Plan     Mechanical fall with ankle fracture, asked to provide pre-operative cardiovascular risk stratification: Does not report any chest pain or dyspnea, but difficult to assess in setting of her dementia.  Limited functional capacity.  RCRI score 3 (CAD, CHF, CVA), indicating >11% indicating high risk of CV complications given comorbidities - also has likely developed progressive CAD over the past several years as evidenced by decline in LVEF with WMA by echo 09/2019, but patient has been treated conservatively as  she has wished to avoid procedures.  She is home hospice. -Discussed extensively with patient's daughter, who is a Marine scientist.  Discussed that patient is moderate to high risk surgical candidate.  However daughter states that she has limited mobility in her left lower extremity since her stroke and if she lost mobility in her right lower extremity as well she would be essentially immobile and quality of life significantly reduced.  Family recognizes the risk of surgery but would like to proceed.  We will repeat an echocardiogram to evaluate for progression of her aortic stenosis, but no other cardiac work-up planned -Echocardiogram shows improvement in LVEF (50-55%), normal RV function, moderate aortic stenosis.  No further cardiac work-up recommended   CAD s/p CABG 2000, with abnormal nuc 2015 and decline in EF 09/2019 suggestive of interval MI, treated medically, on chronic Plavix.  We will hold Plavix given planned surgery and start aspirin 81 mg daily   Chronic combined CHF.  Appears euvolemic, can continue home losartan, toprol XL, Lasix   Permanent atrial fibrillation, rate controlled, on chronic warfarin.  INR 2.3, warfarin being held for surgery.  Can bridge with heparin once INR less than 2.0   Hx of CVA Essential HTN Dementia  [facility]  will sign off.   Medication Recommendations:  Discontinued plavix and started aspirin for her surgery, would plan to continue with aspirin and not plavix on discharge.  Restart warfarin when OK following surgery Other recommendations (labs, testing, etc):  None Follow up as an outpatient:  As scheduled   For questions or updates, please contact [place] Please consult [email or web address] for contact info under        Signed, Donato Heinz, MD  11/02/2020, 5:19 PM

## 2020-11-02 NOTE — TOC CAGE-AID Note (Signed)
Transition of Care [facility]) - CAGE-AID Screening   Patient Details  Name: Amber [name] MRN: [id number] Date of Birth: [birth year]  [facility]) CM/SW Contact:    Nevia Henkin C Tarpley-Carter, [place] Phone Number: 11/02/2020, 10:51 AM   Clinical Narrative: Pt participated in [place].  Pt stated she does not use substance or ETOH.  Pt was not offered resources, due to no usage of substance or ETOH.     Elyan Vanwieren Tarpley-Carter, MSW, LCSW-A Pronouns:  She/Her/Hers [facility] Clinical Social Worker Direct Number:  [phone] [email or web address]  CAGE-AID Screening:    Have You Ever Felt You Ought to Cut Down on Your Drinking or Drug Use?: No Have People Annoyed You By [employer] Your Drinking Or Drug Use?: No Have You Felt Bad Or Guilty About Your Drinking Or Drug Use?: No Have You Ever Had a Drink or Used Drugs First Thing In The Morning to Steady Your Nerves or to Get Rid of a Hangover?: No CAGE-AID Score: 0  Substance Abuse Education Offered: No

## 2020-11-02 NOTE — Progress Notes (Signed)
Ortho Trauma Note  Patient seen and examined at bedside this afternoon.  Discussed with patient's daughter at bedside as well.  The patient has a right displaced ankle fracture.  She is minimally ambulatory on her left side and is on hospice care.  She is on Coumadin and her INR is still elevated.  I feel that proceeding with open reduction internal fixation is most appropriate to allow for immediate mobilization to decrease the risk of postoperative complications.  I did discuss the risks and benefits of proceeding with surgical intervention with the patient's daughter.  Risks included but not limited to bleeding, infection, malunion, nonunion, loss of fixation, posttraumatic arthritis, nerve or blood vessel injury, even the possibility anesthetic complications.  Tentatively I will place the patient on the surgery schedule for Monday pending the OR availability and trauma volume over the weekend.  I believe that regional anesthesia may be best for this patient however her INR is elevated.  I would recommend vitamin K if INR still remains elevated tomorrow and potentially IV vitamin K Sunday if it continues to be elevated.  Please make patient n.p.o. after midnight on Sunday for potential surgery on Monday.\  Shona Needles, MD [facility] [phone] (office) [email or web address]

## 2020-11-02 NOTE — Progress Notes (Signed)
  Echocardiogram 2D Echocardiogram has been performed.  Amber [name] 11/02/2020, 3:08 PM

## 2020-11-02 NOTE — Plan of Care (Signed)
  Problem: Nutrition: Goal: Adequate nutrition will be maintained Outcome: Progressing   Problem: Pain Managment: Goal: General experience of comfort will improve Outcome: Progressing   Problem: Safety: Goal: Ability to remain free from injury will improve Outcome: Progressing   

## 2020-11-02 NOTE — Progress Notes (Signed)
PROGRESS NOTE  Amber [name] IDP:[id number] DOB: 1934/02/23 DOA: 11/01/2020 PCP: Antony Contras, MD  Brief History   Amber [name] is a 86 y.o. female with history h/o hypertension, hyperlipidemia, CAD s/p CABG, chronic atrial fibrillation on Coumadin, CVA in 2018 with residual left-sided weakness, COPD (FEV1 66%) not on home O2, combined systolic/diastolic CHF for which she follows cardiology, dementia/depression presents from home after a fall.  Daughter, Amber [name] who is an Therapist, sports at [employer], is at bedside and provides most of the history.  Patient apparently has longstanding left foot contractures and ambulating difficulties due to which she walks with left lower extremity brace and walker.  She apparently is quite functional and lives alone at home but daughter lives next door-Amber [name] and her sister take turns to stay with patient at nights. Patient recently had heart failure exacerbation for which she refused to go to the hospital and was managed as outpatient with escalated Lasix dosages.  During this time after discussion with PCP and primary cardiology, daughter initiated hospice services for her mother.  Patient pleasantly confused at baseline but is quite communicative.  Patient apparently goes out for dinners with family quite frequently.  Earlier today, patient apparently was walking at home when she bent down to pick up something and lost her balance, per daughter likely twisted her ankle as she was found sitting on her buttocks. ED course: Afebrile, heart rate 47-123, blood pressure elevated at systolic 443X to 540G,?  Initially hypoxic (88% documented but according to daughter likely after pain medication) and placed on 3 L nasal cannula.  When I was in the room, patient was saturating 100% and continued to saturate around 95% without O2.  Work-up in the ED revealed INR 2.3, x-ray showed trimalleolar right ankle fractures which was reduced by EDP-subsequently evaluated by orthopedics and  recommended admission to [facility] for definitive management by Dr. Doreatha Martin next week.  Patient quiet cheerful during my evaluation and does not report pain unless asked about it.  She is pleasantly confused.  The patient was admitted to a telemetry bed. Cardiology was consulted to evaluate perioperative cardiovascular risk for this patient. She is at relatively high risk. However patient's daughter still wishes to go forward with the procedure as not fixing the patient would render the patient completely immobile and severely reduce her quality of life.  Orthopedic surgery will go forward with surgery once swelling in the limb is decreased and INR has normalized.  Consultants  Orthopedic surgery Cardiology  Procedures  None  Antibiotics   Anti-infectives (From admission, onward)    None      Subjective  The patient is resting quietly. She denies pain. Daughter is at bedside. No new complaints.   Objective   Vitals:  Vitals:   11/02/20 0545 11/02/20 0926  BP: (!) 156/64 (!) 125/96  Pulse: 80 82  Resp:  19  Temp: 99.1 F (37.3 C)   SpO2: (!) 89% 95%    Exam:  Constitutional:  Patient is awake and alert. She is confused at baseline. Respiratory:  CTA bilaterally, no w/r/r.  Respiratory effort normal. No retractions or accessory muscle use Cardiovascular:  RRR, no m/r/g No LE extremity edema   Normal pedal pulses Abdomen:  Abdomen appears normal; no tenderness or masses No hernias No HSM Musculoskeletal:  Digits/nails BUE: no clubbing, cyanosis, petechiae, infection exam of joints, bones, muscles of at least one of following: head/neck, RUE, LUE, RLE, LLE   Right lower extremity is bandaged and splinted.  Skin:  No rashes, lesions, ulcers palpation of skin: no induration or nodules Neurologic:  CN 2-12 intact Sensation all 4 extremities intact Psychiatric:  Patient appears to be in good spirits. Denies pain.  I have personally reviewed the following:    Today's Data  Vitals  Lab Data  BNP, CBC, INR  Imaging  CT cervical spine CT head without contrast X-ray right ankle 2 views  Cardiology Data  EKG Echocardiogram  Scheduled Meds:  allopurinol  300 mg Oral QHS   amLODipine  5 mg Oral QHS   aspirin  81 mg Oral Daily   citalopram  20 mg Oral QHS   diphenoxylate-atropine  1 tablet Oral Q M,W,F   donepezil  5 mg Oral Daily   fentaNYL (SUBLIMAZE) injection  50 mcg Intravenous Once   furosemide  80 mg Oral Daily   levothyroxine  25 mcg Oral Q0600   losartan  25 mg Oral Daily   memantine  10 mg Oral BID   metoprolol succinate  25 mg Oral Daily   midazolam  4 mg Intravenous Once   potassium chloride SA  20 mEq Oral Daily   Vitamin D (Ergocalciferol)  50,000 Units Oral Once per day on Tue Fri   Principal Problem:   Ankle fracture Active Problems:   Atrial fibrillation (HCC)   Chronic combined systolic and diastolic CHF (congestive heart failure) (HCC)   Dyslipidemia   Depression   Hypothyroidism   Coronary artery disease involving coronary bypass graft of native heart with angina pectoris (HCC)   COPD (chronic obstructive pulmonary disease) (HCC)   Hemiparesis and alteration of sensations as late effects of stroke (HCC)   Cerebrovascular disease   Asthma-COPD overlap syndrome ([place])   Dementia (HCC)   LOS: 1 day   A & P  1.Right trimalleolar ankle fracture: Reduced in the ED and now placed in a splint.  Seen by orthopedics, Coumadin held and plan for definitive management/ORIF probably next week.  Patient to be admitted to [facility] per orthopedics.  Supportive management with analgesics as needed.Cardiology was consulted to evaluate perioperative cardiovascular risk for this patient. She is at relatively high risk. However patient's daughter still wishes to go forward with the procedure as not fixing the patient would render the patient completely immobile and severely reduce her quality of life. Orthopedic surgery will go  forward with surgery once swelling in the limb is decreased and INR has normalized.   2.  History of ischemic CVA in 2018 with residual left-sided weakness, noted to be on Coumadin as well as?  Plavix as outpatient.  Holding both in anticipation of surgical intervention.  Okay to continue aspirin for now as discussed with cardiology/orthopedics   3. Combined systolic/diastolic CHF: Appears overall compensated.  Patient initially diagnosed with diastolic CHF and most recent echo showed wall motion abnormalities as well as reduced EF at 35 to 40%.  Patient expressed wish for comfort/nonaggressive interventions and did not want to be hospitalized at which time daughter decided on hospice services for patient.  Currently patient does not have any leg swellings but abdomen noted to be somewhat distended-according to daughter she usually develops abdominal swelling of pulmonary edema due to CHF.  Can check BNP.   4.  Asthma/COPD (FEV1 66%): Not on home O2.  Stable currently on room air.  Was transiently hypoxic after receiving sedation for reduction in the ED.  Pulmonary toilet in the perioperative setting, incentive spirometry ordered.   5.  CAD, chronic atrial fibrillation, aortic  stenosis: Discussed with cardiology who recommended to resume home medications.  Okay to hold Coumadin and discontinue Plavix, recommended aspirin for maintenance therapy as of now.  Patient does have operative risk given multiple comorbidities and advanced age but daughter understands and wishes for patient to undergo procedure for quality of life as she is quiet functional at baseline   6.  Hypertension, hyperlipidemia, hypothyroidism: Resume home medications.   7.  Dementia/depression: Resume home medications.  Patient pleasantly confused at this time.  In a cheerful mood today.   8.  Goals of care: Patient currently receiving home hospice services from [facility].  She apparently has refused hospitalizations and  aggressive care on multiple occasions in the past.  Daughter comfortable with hospice decision and did confirm DNR status.  She would like to revoke DNR status during surgery and resume hospice services upon discharge.  She would like patient to undergo ankle surgery for quality of life and pain relief.   DVT prophylaxis: Therapeutic INR currently.  Cannot tolerate SCDs, ordered TED hose.   COVID screen: Negative   Code Status: DNR   Health care proxy would be her daughters   Patient/Family Communication: Discussed with patient 's daughter and all questions answered to satisfaction.  Consults called: Cardiology, orthopedics Admission status :IInpatient   Nikhil Osei, DO [facility] Direct contact: see [email or web address]  7PM-7AM contact night coverage as above 11/02/2020, 6:01 PM  LOS: 1 day

## 2020-11-02 NOTE — H&P (View-Only) (Signed)
Ortho Trauma Note  Patient seen and examined at bedside this afternoon.  Discussed with patient's daughter at bedside as well.  The patient has a right displaced ankle fracture.  She is minimally ambulatory on her left side and is on hospice care.  She is on Coumadin and her INR is still elevated.  I feel that proceeding with open reduction internal fixation is most appropriate to allow for immediate mobilization to decrease the risk of postoperative complications.  I did discuss the risks and benefits of proceeding with surgical intervention with the patient's daughter.  Risks included but not limited to bleeding, infection, malunion, nonunion, loss of fixation, posttraumatic arthritis, nerve or blood vessel injury, even the possibility anesthetic complications.  Tentatively I will place the patient on the surgery schedule for Monday pending the OR availability and trauma volume over the weekend.  I believe that regional anesthesia may be best for this patient however her INR is elevated.  I would recommend vitamin K if INR still remains elevated tomorrow and potentially IV vitamin K Sunday if it continues to be elevated.  Please make patient n.p.o. after midnight on Sunday for potential surgery on Monday.\  Shona Needles, MD [facility] [phone] (office) [email or web address]

## 2020-11-03 DIAGNOSIS — I482 Chronic atrial fibrillation, unspecified: Secondary | ICD-10-CM | POA: Diagnosis not present

## 2020-11-03 DIAGNOSIS — I5042 Chronic combined systolic (congestive) and diastolic (congestive) heart failure: Secondary | ICD-10-CM | POA: Diagnosis not present

## 2020-11-03 DIAGNOSIS — S82891A Other fracture of right lower leg, initial encounter for closed fracture: Secondary | ICD-10-CM | POA: Diagnosis not present

## 2020-11-03 DIAGNOSIS — Z01810 Encounter for preprocedural cardiovascular examination: Secondary | ICD-10-CM | POA: Diagnosis not present

## 2020-11-03 LAB — BASIC METABOLIC PANEL
Anion gap: 7 (ref 5–15)
BUN: 18 mg/dL (ref 8–23)
CO2: 31 mmol/L (ref 22–32)
Calcium: 8.8 mg/dL — ABNORMAL LOW (ref 8.9–10.3)
Chloride: 100 mmol/L (ref 98–111)
Creatinine, Ser: 1.11 mg/dL — ABNORMAL HIGH (ref 0.44–1.00)
GFR, Estimated: 49 mL/min — ABNORMAL LOW (ref >60)
Glucose, Bld: 125 mg/dL — ABNORMAL HIGH (ref 70–99)
Potassium: 4.1 mmol/L (ref 3.5–5.1)
Sodium: 138 mmol/L (ref 135–145)

## 2020-11-03 LAB — PROTIME-INR
INR: 2.4 — ABNORMAL HIGH (ref 0.8–1.2)
Prothrombin Time: 26.2 seconds — ABNORMAL HIGH (ref 11.4–15.2)

## 2020-11-03 NOTE — Evaluation (Signed)
Physical Therapy Evaluation Patient Details Name: DANTE COOTER MRN: [id number] DOB: 1934-09-12 Today's Date: 11/03/2020  History of Present Illness  86 yo female presenting to ED on 9/2 with fall at home. Imaging showing R ankle fx. Awaiting ORIF, currently with closed reduction. PMH including cervical spinal stenosis, a-fib, COPD, dementia, CHF, CAD, depresssion, fibromyalgia, HTN, OA, and fall sustaining L rib fx (2001).  Clinical Impression  Pt was seen with OT for evaluation, notably requiring two skilled person assist to control her balance and maintain NWB on RLE.  Pt is in pain since she has not had ORIF yet, particularly sensitive on medial R ankle.  Will work with pt carefully to move to chair, to strengthen unrestricted limb joints and to increase her control of standing as she is able within restrictions.  Daughter is a caregiver so will be helpful to observe therapy assisting her mother.       Recommendations for follow up therapy are one component of a multi-disciplinary discharge planning process, led by the attending physician.  Recommendations may be updated based on patient status, additional functional criteria and insurance authorization.  Follow Up Recommendations CIR    Equipment Recommendations  None recommended by PT    Recommendations for Other Services Rehab consult     Precautions / Restrictions Precautions Precautions: Fall Required Braces or Orthoses: Splint/Cast Splint/Cast: RLE splinted and wrapped in ace wrap Restrictions Weight Bearing Restrictions: Yes RLE Weight Bearing: Non weight bearing      Mobility  Bed Mobility Overal bed mobility: Needs Assistance Bed Mobility: Supine to Sit     Supine to sit: Max assist;+2 for physical assistance     General bed mobility comments: max assist supporting trunk by PT and LE's with OT    Transfers Overall transfer level: Needs assistance Equipment used: 1 person hand held assist Transfers: Sit  to/from [facility] Sit to Stand: Max assist;+2 safety/equipment;+2 physical assistance Stand pivot transfers: Max assist;+2 physical assistance;+2 safety/equipment;From elevated surface       General transfer comment: one max for pivot and one to keep RLE NWB  Ambulation/Gait             General Gait Details: deferred until safe  Stairs            Wheelchair Mobility    Modified Rankin (Stroke Patients Only)       Balance Overall balance assessment: Needs assistance Sitting-balance support: No upper extremity supported Sitting balance-Leahy Scale: Poor Sitting balance - Comments: leans R and back, esp with the effort to maintain unsupported posture Postural control: Posterior lean Standing balance support: Bilateral upper extremity supported;During functional activity Standing balance-Leahy Scale: Poor Standing balance comment: Reliant on physical A                             Pertinent Vitals/Pain Pain Assessment: Faces Faces Pain Scale: Hurts even more Pain Location: RLE with touch Pain Descriptors / Indicators: Grimacing;Guarding Pain Intervention(s): Limited activity within patient's tolerance;Monitored during session;Premedicated before session;Repositioned    Home Living Family/patient expects to be discharged to:: Private residence Living Arrangements: Alone Available Help at Discharge: Family Type of Home: House Home Access: Ramped entrance     Home Layout: One level Home Equipment: Programme researcher, broadcasting/film/video - 2 wheels;Shower seat;Hand held shower head Additional Comments: Daughters take turns spending the night with her.    Prior Function Level of Independence: Needs assistance   Gait / Transfers Assistance Needed:  RW with supervision of family  ADL's / Homemaking Assistance Needed: daughter assisted pt to sit on shower chair, then pt bathed herself.  Set up with clothing and pt dresses herself        Hand  Dominance   Dominant Hand: Right    Extremity/Trunk Assessment   Upper Extremity Assessment Upper Extremity Assessment: Defer to OT evaluation    Lower Extremity Assessment Lower Extremity Assessment: RLE deficits/detail    Cervical / Trunk Assessment Cervical / Trunk Assessment: Kyphotic  Communication   Communication: No difficulties  Cognition Arousal/Alertness: Awake/alert Behavior During Therapy: WFL for tasks assessed/performed Overall Cognitive Status: History of cognitive impairments - at baseline                                 General Comments: agreeable but confused      General Comments General comments (skin integrity, edema, etc.): daughter was there as she is caregiver and has information for the pt    Exercises     Assessment/Plan    PT Assessment Patient needs continued PT services  PT Problem List Decreased strength;Decreased activity tolerance;Decreased balance;Decreased mobility       PT Treatment Interventions DME instruction;Gait training;Functional mobility training;Therapeutic activities;Therapeutic exercise;Balance training;Neuromuscular re-education;Patient/family education    PT Goals (Current goals can be found in the Care Plan section)  Acute Rehab PT Goals Patient Stated Goal: Go home PT Goal Formulation: With patient/family Time For Goal Achievement: 11/17/20 Potential to Achieve Goals: Good    Frequency Min 5X/week   Barriers to discharge Decreased caregiver support (has one family member at a time) limited family assistance    Co-evaluation PT/OT/SLP Co-Evaluation/Treatment: Yes Reason for Co-Treatment: For patient/therapist safety;To address functional/ADL transfers PT goals addressed during session: Mobility/safety with mobility;Balance OT goals addressed during session: ADL's and self-care       AM-PAC PT "6 Clicks" Mobility  Outcome Measure Help needed turning from your back to your side while in a flat  bed without using bedrails?: A Lot Help needed moving from lying on your back to sitting on the side of a flat bed without using bedrails?: Total Help needed moving to and from a bed to a chair (including a wheelchair)?: Total Help needed standing up from a chair using your arms (e.g., wheelchair or bedside chair)?: Total Help needed to walk in hospital room?: Total Help needed climbing 3-5 steps with a railing? : Total 6 Click Score: 7    End of Session Equipment Utilized During Treatment: Gait belt Activity Tolerance: Patient tolerated treatment well;Patient limited by fatigue Patient left: in chair;with call bell/phone within reach;with chair alarm set;with family/visitor present Nurse Communication: Mobility status PT Visit Diagnosis: Unsteadiness on feet (R26.81);Muscle weakness (generalized) (M62.81);Pain Pain - Right/Left: Right Pain - part of body: Leg;Ankle and joints of foot    Time: 1105-1136 PT Time Calculation (min) (ACUTE ONLY): 31 min   Charges:   PT Evaluation $PT Eval Moderate Complexity: 1 Mod         Ramond Dial 11/03/2020, 4:33 PM  Mee Hives, PT MS Acute Rehab Dept. Number: [place]

## 2020-11-03 NOTE — Progress Notes (Signed)
PROGRESS NOTE  Amber [name] JQB:[id number] DOB: 1934-02-17 DOA: 11/01/2020 PCP: Antony Contras, MD  Brief History   Amber [name] is a 86 y.o. female with history h/o hypertension, hyperlipidemia, CAD s/p CABG, chronic atrial fibrillation on Coumadin, CVA in 2018 with residual left-sided weakness, COPD (FEV1 66%) not on home O2, combined systolic/diastolic CHF for which she follows cardiology, dementia/depression presents from home after a fall.  Daughter, Amber [name] who is an Therapist, sports at [employer], is at bedside and provides most of the history.  Patient apparently has longstanding left foot contractures and ambulating difficulties due to which she walks with left lower extremity brace and walker.  She apparently is quite functional and lives alone at home but daughter lives next door-Amber [name] and Amber [name] take turns to stay with patient at nights. Patient recently had heart failure exacerbation for which she refused to go to the hospital and was managed as outpatient with escalated Lasix dosages.  During this time after discussion with PCP and primary cardiology, daughter initiated hospice services for Amber mother.  Patient pleasantly confused at baseline but is quite communicative.  Patient apparently goes out for dinners with family quite frequently.  Earlier today, patient apparently was walking at home when she bent down to pick up something and lost Amber balance, per daughter likely twisted Amber ankle as she was found sitting on Amber buttocks. ED course: Afebrile, heart rate 47-123, blood pressure elevated at systolic 409B to 353G,?  Initially hypoxic (88% documented but according to daughter likely after pain medication) and placed on 3 L nasal cannula.  When I was in the room, patient was saturating 100% and continued to saturate around 95% without O2.  Work-up in the ED revealed INR 2.3, x-ray showed trimalleolar right ankle fractures which was reduced by EDP-subsequently evaluated by orthopedics and  recommended admission to [facility] for definitive management by Dr. Doreatha Martin next week.  Patient quiet cheerful during my evaluation and does not report pain unless asked about it.  She is pleasantly confused.  The patient was admitted to a telemetry bed. Cardiology was consulted to evaluate perioperative cardiovascular risk for this patient. She is at relatively high risk. However patient's daughter still wishes to go forward with the procedure as not fixing the patient would render the patient completely immobile and severely reduce Amber quality of life.  Orthopedic surgery will go forward with surgery once swelling in the limb is decreased and INR has normalized. This will likely be Monday.  Consultants  Orthopedic surgery Cardiology  Procedures  None  Antibiotics   Anti-infectives (From admission, onward)    None      Subjective  The patient is resting quietly. She denies pain. Daughter is at bedside. No new complaints.   Objective   Vitals:  Vitals:   11/03/20 0453 11/03/20 0737  BP: 137/68 115/79  Pulse: 78 72  Resp: 16 18  Temp: 98.3 F (36.8 C) 98.3 F (36.8 C)  SpO2: 94% 95%    Exam:  Constitutional:  Patient is awake and alert. She is confused at baseline. Respiratory:  CTA bilaterally, no w/r/r.  Respiratory effort normal. No retractions or accessory muscle use Cardiovascular:  RRR, no m/r/g No LE extremity edema   Normal pedal pulses Abdomen:  Abdomen appears normal; no tenderness or masses No hernias No HSM Musculoskeletal:  Digits/nails BUE: no clubbing, cyanosis, petechiae, infection exam of joints, bones, muscles of at least one of following: head/neck, RUE, LUE, RLE, LLE   Right lower  extremity is bandaged and splinted. Skin:  No rashes, lesions, ulcers palpation of skin: no induration or nodules Neurologic:  CN 2-12 intact Sensation all 4 extremities intact Psychiatric:  Patient appears to be in good spirits. Denies pain.  I have  personally reviewed the following:   Today's Data  Vitals  Lab Data  BNP, CBC, INR  Imaging  CT cervical spine CT head without contrast X-ray right ankle 2 views  Cardiology Data  EKG Echocardiogram  Scheduled Meds:  allopurinol  300 mg Oral QHS   amLODipine  5 mg Oral QHS   aspirin  81 mg Oral Daily   citalopram  20 mg Oral QHS   diphenoxylate-atropine  1 tablet Oral Q M,W,F   donepezil  5 mg Oral Daily   fentaNYL (SUBLIMAZE) injection  50 mcg Intravenous Once   furosemide  80 mg Oral Daily   levothyroxine  25 mcg Oral Q0600   losartan  25 mg Oral Daily   memantine  10 mg Oral BID   metoprolol succinate  25 mg Oral Daily   midazolam  4 mg Intravenous Once   potassium chloride SA  20 mEq Oral Daily   Vitamin D (Ergocalciferol)  50,000 Units Oral Once per day on Tue Fri   Principal Problem:   Ankle fracture Active Problems:   Atrial fibrillation (HCC)   Chronic combined systolic and diastolic CHF (congestive heart failure) (HCC)   Dyslipidemia   Depression   Hypothyroidism   Coronary artery disease involving coronary bypass graft of native heart with angina pectoris (HCC)   COPD (chronic obstructive pulmonary disease) (HCC)   Hemiparesis and alteration of sensations as late effects of stroke (HCC)   Cerebrovascular disease   Asthma-COPD overlap syndrome ([place])   Dementia (HCC)   LOS: 2 days   A & P  1.Right trimalleolar ankle fracture: Reduced in the ED and now placed in a splint.  Seen by orthopedics, Coumadin held and plan for definitive management/ORIF probably next week.  Patient to be admitted to [facility] per orthopedics.  Supportive management with analgesics as needed.Cardiology was consulted to evaluate perioperative cardiovascular risk for this patient. She is at relatively high risk. However patient's daughter still wishes to go forward with the procedure as not fixing the patient would render the patient completely immobile and severely reduce Amber quality of  life. Orthopedic surgery will go forward with surgery once swelling in the limb is decreased and INR has normalized. Anticipating surgery on Monday.   2.  History of ischemic CVA in 2018 with residual left-sided weakness, noted to be on Coumadin as well as?  Plavix as outpatient.  Holding both in anticipation of surgical intervention.  Okay to continue aspirin for now as discussed with cardiology/orthopedics   3. Combined systolic/diastolic CHF: Appears overall compensated.  Patient initially diagnosed with diastolic CHF and most recent echo showed wall motion abnormalities as well as reduced EF at 35 to 40%.  Patient expressed wish for comfort/nonaggressive interventions and did not want to be hospitalized at which time daughter decided on hospice services for patient.  Currently patient does not have any leg swellings but abdomen noted to be somewhat distended-according to daughter she usually develops abdominal swelling of pulmonary edema due to CHF.  Can check BNP.   4.  Asthma/COPD (FEV1 66%): Not on home O2.  Stable currently on room air.  Was transiently hypoxic after receiving sedation for reduction in the ED.  Pulmonary toilet in the perioperative setting, incentive spirometry ordered.  5.  CAD, chronic atrial fibrillation, aortic stenosis: Discussed with cardiology who recommended to resume home medications.  Okay to hold Coumadin and discontinue Plavix, recommended aspirin for maintenance therapy as of now.  Patient does have operative risk given multiple comorbidities and advanced age but daughter understands and wishes for patient to undergo procedure for quality of life as she is quiet functional at baseline   6.  Hypertension, hyperlipidemia, hypothyroidism: Resume home medications.   7.  Dementia/depression: Resume home medications.  Patient pleasantly confused at this time.  In a cheerful mood today.   8.  Goals of care: Patient currently receiving home hospice services from [facility].  She apparently has refused hospitalizations and aggressive care on multiple occasions in the past.  Daughter comfortable with hospice decision and did confirm DNR status.  She would like to revoke DNR status during surgery and resume hospice services upon discharge.  She would like patient to undergo ankle surgery for quality of life and pain relief.   DVT prophylaxis: Therapeutic INR currently.  Cannot tolerate SCDs, ordered TED hose.   COVID screen: Negative   Code Status: DNR   Health care proxy would be Amber daughters   Patient/Family Communication: Discussed with patient 's daughter and all questions answered to satisfaction.  Consults called: Cardiology, orthopedics Admission status :IInpatient   Danaly Bari, DO [facility] Direct contact: see [email or web address]  7PM-7AM contact night coverage as above 11/03/2020, 4:03 PM  LOS: 1 day

## 2020-11-03 NOTE — Evaluation (Signed)
Occupational Therapy Evaluation Patient Details Name: Amber [name] MRN: [id number] DOB: 25-Mar-1934 Today's Date: 11/03/2020   History of Present Illness 86 yo female presenting to ED on 9/2 with fall at home. Imagining showing R ankle fx. Awaiting ORIF. PMH including cervical spinal stenosis, a-fib, COPD, dementia, CHF, CAD, depresssion, fibromyalgia, HTN, OA, and fall sustianing L rib fx (2001).   Clinical Impression   PTA, pt required set up and assistance for ADLs; family providing 24/7 support and supervision and performing IADLs. Pt currently requiring Min A for UB ADLs, Max A for LB ADLs, and Max A +2 for functional transfers. Pt presenting with increased motivation despite fatigue and pain. Pt would benefit from further acute OT to facilitate safe dc. Due to pt's good family support and motivation, recommend dc to CIR for further OT to optimize safety, independence with ADLs, and return to PLOF.      Recommendations for follow up therapy are one component of a multi-disciplinary discharge planning process, led by the attending physician.  Recommendations may be updated based on patient status, additional functional criteria and insurance authorization.   Follow Up Recommendations  CIR    Equipment Recommendations  3 in 1 bedside commode;Wheelchair cushion (measurements OT);Wheelchair (measurements OT)    Recommendations for Other Services Rehab consult     Precautions / Restrictions Precautions Precautions: Fall Required Braces or Orthoses: Splint/Cast Splint/Cast: RLE splinted and wrapped in ace wrap Restrictions Weight Bearing Restrictions: Yes RLE Weight Bearing: Non weight bearing      Mobility Bed Mobility Overal bed mobility: Needs Assistance Bed Mobility: Supine to Sit     Supine to sit: Max assist;+2 for physical assistance     General bed mobility comments: Max A for managing BLEs and then elevating trunk    Transfers Overall transfer level: Needs  assistance Equipment used: None Transfers: Sit to/from [facility] Sit to Stand: Max assist;+2 physical assistance Stand pivot transfers: Max assist;+2 physical assistance       General transfer comment: Max A +2 for power up and maintaining balance duirng pivot with second perform to hold RLE and maintain NWB status    Balance Overall balance assessment: Needs assistance Sitting-balance support: No upper extremity supported;Feet supported Sitting balance-Leahy Scale: Poor Sitting balance - Comments: Able to maintain static sitting; becoming fatigue nad lateral leaning to R   Standing balance support: Bilateral upper extremity supported;During functional activity Standing balance-Leahy Scale: Poor Standing balance comment: Reliant on physical A                           ADL either performed or assessed with clinical judgement   ADL Overall ADL's : Needs assistance/impaired Eating/Feeding: Set up;Supervision/ safety;Sitting   Grooming: Set up;Supervision/safety;Sitting   Upper Body Bathing: Minimal assistance;Sitting   Lower Body Bathing: Maximal assistance;Sit to/from stand   Upper Body Dressing : Minimal assistance;Cueing for UE precautions;Sitting   Lower Body Dressing: Maximal assistance Lower Body Dressing Details (indicate cue type and reason): Max A to don L AFO in tennis shoe Toilet Transfer: Maximal assistance;+2 for physical assistance;Stand-pivot (simulated to recliner)           Functional mobility during ADLs: Maximal assistance;+2 for physical assistance General ADL Comments: Pt very motivated and agreeable to therapy. Difficulty with stand pivot do to limited balance while maitnaining NWB status     Vision         Perception     Praxis  Pertinent Vitals/Pain Pain Assessment: Faces Faces Pain Scale: Hurts little more Pain Location: RLE with touch Pain Descriptors / Indicators: Grimacing;Discomfort Pain  Intervention(s): Monitored during session;Limited activity within patient's tolerance;Repositioned     Hand Dominance Right   Extremity/Trunk Assessment Upper Extremity Assessment Upper Extremity Assessment: Generalized weakness   Lower Extremity Assessment Lower Extremity Assessment: Defer to PT evaluation   Cervical / Trunk Assessment Cervical / Trunk Assessment: Kyphotic   Communication Communication Communication: No difficulties   Cognition Arousal/Alertness: Awake/alert Behavior During Therapy: WFL for tasks assessed/performed Overall Cognitive Status: History of cognitive impairments - at baseline                                 General Comments: Dementia at baseline. Pt pleasent and following cues throughout.   General Comments  daughter, [name], present throughout    Exercises     Shoulder Instructions      Home Living Family/patient expects to be discharged to:: Private residence Living Arrangements: Alone Available Help at Discharge: Family Type of Home: House Home Access: Ramped entrance (and threshold)     Home Layout: One level     Bathroom Shower/Tub: Occupational psychologist: Handicapped height     Home Equipment: Programme researcher, broadcasting/film/video - 2 wheels;Shower seat;Hand held shower head   Additional Comments: Daughters take turns spending the night with her.      Prior Functioning/Environment Level of Independence: Needs assistance  Gait / Transfers Assistance Needed: RW with supervision of family ADL's / Homemaking Assistance Needed: daughter assisted pt to sit on shower chair, then pt bathed herself.  Set up with clothing and pt dresses herself            OT Problem List: Decreased strength;Decreased range of motion;Decreased activity tolerance;Impaired balance (sitting and/or standing);Decreased knowledge of use of DME or AE;Decreased knowledge of precautions      OT Treatment/Interventions: Therapeutic  exercise;Self-care/ADL training;Energy conservation;DME and/or AE instruction;Patient/family education;Therapeutic activities    OT Goals(Current goals can be found in the care plan section) Acute Rehab OT Goals Patient Stated Goal: Go home OT Goal Formulation: With patient Time For Goal Achievement: 11/17/20 Potential to Achieve Goals: Good  OT Frequency: Min 2X/week   Barriers to D/C:            Co-evaluation PT/OT/SLP Co-Evaluation/Treatment: Yes Reason for Co-Treatment: For patient/therapist safety;To address functional/ADL transfers   OT goals addressed during session: ADL's and self-care      AM-PAC OT "6 Clicks" Daily Activity     Outcome Measure Help from another person eating meals?: A Little Help from another person taking care of personal grooming?: A Little Help from another person toileting, which includes using toliet, bedpan, or urinal?: A Lot Help from another person bathing (including washing, rinsing, drying)?: A Lot Help from another person to put on and taking off regular upper body clothing?: A Little Help from another person to put on and taking off regular lower body clothing?: A Lot 6 Click Score: 15   End of Session Equipment Utilized During Treatment: Gait belt Nurse Communication: Mobility status  Activity Tolerance: Patient tolerated treatment well Patient left: in chair;with call bell/phone within reach;with chair alarm set;with family/visitor present  OT Visit Diagnosis: Unsteadiness on feet (R26.81);Other abnormalities of gait and mobility (R26.89);Muscle weakness (generalized) (M62.81);Pain Pain - Right/Left: Right Pain - part of body: Leg  Time: 0761-5183 OT Time Calculation (min): 22 min Charges:  OT General Charges $OT Visit: 1 Visit OT Evaluation $OT Eval Moderate Complexity: [place], OTR/L Acute Rehab Pager: [phone] Office: [place] 11/03/2020, 1:21 PM

## 2020-11-03 NOTE — Progress Notes (Signed)
[name] [phone] [facility]) Hospitalized Hospice Patient   Amber [name] is a current hospice patient with [facility] with a terminal diagnosis of CKD stage 3 with chronic combined CHF. EMS was initiated by family with report of fall, noted right ankle deformity. Patient was initially transported to [facility] ED and then later to [facility]. She is admitted with a right trimalleolar ankle fracture. This is a related admission per Dr. Karie Georges with [facility].   Met with patient and daughter Amber [name] in room, patient is sitting up in chair. She is pleasantly confused and does not offer any complaints at this time. Dtr Amber [name] continues to verbalize concern over what patient will need after surgery. Offered support and answered her questions. She still believes plan will likely be for surgery on Monday. Amber [name] further reports patient has not up until now had any further pain medication since yesterday, she feels it was making her very groggy and slurred.    V/S: T 98.3, HR 72, RR 18, BP 115/79, spO2 95% on room air I&O: 800/500 Labs: BNP 233, INR 2.4 Diagnostics: none new IV/PRNs: oxy IR 5 mg PO x 1   She remains inpatient appropriate as she is waiting for ORIF of her right ankle, requiring pain management, and waiting for her INR to drop to a safe level so she can have surgery.    1.Right trimalleolar ankle fracture: Reduced in the ED and now placed in a splint.  Seen by orthopedics, Coumadin held and plan for definitive management/ORIF probably next week.  Patient to be admitted to [facility] per orthopedics.  Supportive management with analgesics as needed.   2.  History of ischemic CVA in 2018 with residual left-sided weakness, noted to be on Coumadin as well as Plavix as outpatient.  Holding both in anticipation of surgical intervention.  Okay to continue aspirin for now as discussed with cardiology/orthopedics   3. Combined systolic/diastolic CHF: Appears overall compensated.  Patient initially diagnosed with  diastolic CHF and most recent echo showed wall motion abnormalities as well as reduced EF at 35 to 40%. Repeat echo today in anticipation for clearance for surgery next week.    4.  CAD, chronic atrial fibrillation, aortic stenosis: Discussed with cardiology who recommended to resume home medications.  Okay to hold Coumadin and discontinue Plavix, recommended aspirin for maintenance therapy as of now.  Patient does have operative risk given multiple comorbidities and advanced age but daughter understands and wishes for patient to undergo procedure for quality of life as she is quiet functional at baseline. Echo today to determine if aortic stenosis has worsened and to help anesthesia determine plan for OR.   Discharge planning: Ongoing. Pt lives at home with her husband. Her two daughters check on them frequently and are actively involved in their care. Ultimately, they would like for her to return home with hospice but that will depend on if she is able to be mobilized post surgery.  IDT: updated GOC: Clear. DNR. Surgery to repair fx to hopefully allow her to return back home. Family: Present and update on plan of care.    Should pt require ambulance transport, please use [facility] as they contract this service for our active hospice patients. Jhonnie Garner, BSN, RN, [facility] Liaison [phone]

## 2020-11-04 DIAGNOSIS — E785 Hyperlipidemia, unspecified: Secondary | ICD-10-CM

## 2020-11-04 DIAGNOSIS — J449 Chronic obstructive pulmonary disease, unspecified: Secondary | ICD-10-CM | POA: Diagnosis not present

## 2020-11-04 DIAGNOSIS — I482 Chronic atrial fibrillation, unspecified: Secondary | ICD-10-CM | POA: Diagnosis not present

## 2020-11-04 DIAGNOSIS — I679 Cerebrovascular disease, unspecified: Secondary | ICD-10-CM | POA: Diagnosis not present

## 2020-11-04 DIAGNOSIS — F32A Depression, unspecified: Secondary | ICD-10-CM

## 2020-11-04 DIAGNOSIS — S82891A Other fracture of right lower leg, initial encounter for closed fracture: Secondary | ICD-10-CM | POA: Diagnosis not present

## 2020-11-04 LAB — URINALYSIS, ROUTINE W REFLEX MICROSCOPIC
Bilirubin Urine: NEGATIVE
Glucose, UA: NEGATIVE mg/dL
Ketones, ur: NEGATIVE mg/dL
Nitrite: NEGATIVE
Protein, ur: NEGATIVE mg/dL
Specific Gravity, Urine: <1.005 — ABNORMAL LOW (ref 1.005–1.030)
pH: 6 (ref 5.0–8.0)

## 2020-11-04 LAB — URINALYSIS, MICROSCOPIC (REFLEX): Bacteria, UA: NONE SEEN

## 2020-11-04 LAB — PROTIME-INR
INR: 1.9 — ABNORMAL HIGH (ref 0.8–1.2)
INR: 1.4 — ABNORMAL HIGH (ref 0.8–1.2)
Prothrombin Time: 22 seconds — ABNORMAL HIGH (ref 11.4–15.2)
Prothrombin Time: 17.4 seconds — ABNORMAL HIGH (ref 11.4–15.2)

## 2020-11-04 NOTE — Progress Notes (Signed)
PROGRESS NOTE  Amber [name]:[id number] DOB: 1934-08-12 DOA: 11/01/2020 PCP: Antony Contras, MD  Brief History   Amber [name] is a 86 y.o. female with history h/o hypertension, hyperlipidemia, CAD s/p CABG, chronic atrial fibrillation on Coumadin, CVA in 2018 with residual left-sided weakness, COPD (FEV1 66%) not on home O2, combined systolic/diastolic CHF for which she follows cardiology, dementia/depression presents from home after a fall.  Daughter, Amber [name] who is an Therapist, sports at [employer], is at bedside and provides most of the history.  Patient apparently has longstanding left foot contractures and ambulating difficulties due to which she walks with left lower extremity brace and walker.  She apparently is quite functional and lives alone at home but daughter lives next door-Amber [name] and Amber sister take turns to stay with patient at nights. Patient recently had heart failure exacerbation for which she refused to go to the hospital and was managed as outpatient with escalated Lasix dosages.  During this time after discussion with PCP and primary cardiology, daughter initiated hospice services for Amber [name].  Patient pleasantly confused at baseline but is quite communicative.  Patient apparently goes out for dinners with family quite frequently.  Earlier today, patient apparently was walking at home when she bent down to pick up something and lost Amber balance, per daughter likely twisted Amber ankle as she was found sitting on Amber buttocks. ED course: Afebrile, heart rate 47-123, blood pressure elevated at systolic 601U to 932T,?  Initially hypoxic (88% documented but according to daughter likely after pain medication) and placed on 3 L nasal cannula.  When I was in the room, patient was saturating 100% and continued to saturate around 95% without O2.  Work-up in the ED revealed INR 2.3, x-ray showed trimalleolar right ankle fractures which was reduced by EDP-subsequently evaluated by orthopedics and  recommended admission to [facility] for definitive management by Dr. Doreatha Martin next week.  Patient quiet cheerful during my evaluation and does not report pain unless asked about it.  She is pleasantly confused.  The patient was admitted to a telemetry bed. Cardiology was consulted to evaluate perioperative cardiovascular risk for this patient. She is at relatively high risk. However patient's daughter still wishes to go forward with the procedure as not fixing the patient would render the patient completely immobile and severely reduce Amber quality of life.  Orthopedic surgery will go forward with surgery once swelling in the limb is decreased and INR has normalized. This will likely be tomorrow am. Will recheck INR tonight.  Consultants  Orthopedic surgery Cardiology  Procedures  None  Antibiotics   Anti-infectives (From admission, onward)    None      Subjective  The patient is resting quietly. She denies pain. Daughter is at bedside. No new complaints.   Objective   Vitals:  Vitals:   11/04/20 0848 11/04/20 1615  BP: 129/68 (!) 136/102  Pulse: 66 75  Resp: 18 18  Temp: 97.6 F (36.4 C) 97.7 F (36.5 C)  SpO2: 95% 94%    Exam:  Constitutional:  Patient is awake and alert. She is confused at baseline. Respiratory:  CTA bilaterally, no w/r/r.  Respiratory effort normal. No retractions or accessory muscle use Cardiovascular:  RRR, no m/r/g No LE extremity edema   Normal pedal pulses Abdomen:  Abdomen appears normal; no tenderness or masses No hernias No HSM Musculoskeletal:  Digits/nails BUE: no clubbing, cyanosis, petechiae, infection exam of joints, bones, muscles of at least one of following: head/neck, RUE, LUE,  RLE, LLE   Right lower extremity is bandaged and splinted. Skin:  No rashes, lesions, ulcers palpation of skin: no induration or nodules Neurologic:  CN 2-12 intact Sensation all 4 extremities intact Psychiatric:  Patient appears to be in  good spirits. Denies pain.  I have personally reviewed the following:   Today's Data  Vitals  Lab Data  BNP, CBC, INR  Imaging  CT cervical spine CT head without contrast X-ray right ankle 2 views  Cardiology Data  EKG Echocardiogram  Scheduled Meds:  allopurinol  300 mg Oral QHS   amLODipine  5 mg Oral QHS   aspirin  81 mg Oral Daily   citalopram  20 mg Oral QHS   diphenoxylate-atropine  1 tablet Oral Q M,W,F   donepezil  5 mg Oral Daily   fentaNYL (SUBLIMAZE) injection  50 mcg Intravenous Once   furosemide  80 mg Oral Daily   levothyroxine  25 mcg Oral Q0600   losartan  25 mg Oral Daily   memantine  10 mg Oral BID   metoprolol succinate  25 mg Oral Daily   midazolam  4 mg Intravenous Once   potassium chloride SA  20 mEq Oral Daily   Vitamin D (Ergocalciferol)  50,000 Units Oral Once per day on Tue Fri   Principal Problem:   Ankle fracture Active Problems:   Atrial fibrillation (HCC)   Chronic combined systolic and diastolic CHF (congestive heart failure) (HCC)   Dyslipidemia   Depression   Hypothyroidism   Coronary artery disease involving coronary bypass graft of native heart with angina pectoris (HCC)   COPD (chronic obstructive pulmonary disease) (HCC)   Hemiparesis and alteration of sensations as late effects of stroke (HCC)   Cerebrovascular disease   Asthma-COPD overlap syndrome ([place])   Dementia (HCC)   LOS: 3 days   A & P  1.Right trimalleolar ankle fracture: Reduced in the ED and now placed in a splint.  Seen by orthopedics, Coumadin held and plan for definitive management/ORIF probably next week.  Patient to be admitted to [facility] per orthopedics.  Supportive management with analgesics as needed.Cardiology was consulted to evaluate perioperative cardiovascular risk for this patient. She is at relatively high risk. However patient's daughter still wishes to go forward with the procedure as not fixing the patient would render the patient completely immobile  and severely reduce Amber quality of life. Orthopedic surgery will go forward with surgery once swelling in the limb is decreased and INR has normalized. This will likely be tomorrow am. Will recheck INR tonight.   2.  History of ischemic CVA in 2018 with residual left-sided weakness, noted to be on Coumadin as well as?  Plavix as outpatient.  Holding both in anticipation of surgical intervention.  Okay to continue aspirin for now as discussed with cardiology/orthopedics.   3. Combined systolic/diastolic CHF: Appears overall compensated.  Patient initially diagnosed with diastolic CHF and most recent echo showed wall motion abnormalities as well as reduced EF at 35 to 40%.  Patient expressed wish for comfort/nonaggressive interventions and did not want to be hospitalized at which time daughter decided on hospice services for patient.  Currently patient does not have any leg swellings but abdomen noted to be somewhat distended-according to daughter she usually develops abdominal swelling of pulmonary edema due to CHF.  Can check BNP. Monitor volume satus.   4.  Asthma/COPD (FEV1 66%): Not on home O2.  Stable currently on room air.  Was transiently hypoxic after receiving sedation  for reduction in the ED.  Pulmonary toilet in the perioperative setting, incentive spirometry ordered.   5.  CAD, chronic atrial fibrillation, aortic stenosis: Discussed with cardiology who recommended to resume home medications.  Okay to hold Coumadin and discontinue Plavix, recommended aspirin for maintenance therapy as of now.  Patient does have operative risk given multiple comorbidities and advanced age but daughter understands and wishes for patient to undergo procedure for quality of life as she is quiet functional at baseline   6.  Hypertension, hyperlipidemia, hypothyroidism: Resume home medications.   7.  Dementia/depression: Resume home medications.  Patient pleasantly confused at this time.  In a cheerful mood today.    8.  Goals of care: Patient currently receiving home hospice services from [facility].  She apparently has refused hospitalizations and aggressive care on multiple occasions in the past.  Daughter comfortable with hospice decision and did confirm DNR status.  She would like to revoke DNR status during surgery and resume hospice services upon discharge.  She would like patient to undergo ankle surgery for quality of life and pain relief.   DVT prophylaxis: Therapeutic INR currently.  Cannot tolerate SCDs, ordered TED hose.   COVID screen: Negative   Code Status: DNR   Health care proxy would be Amber daughters   Patient/Family Communication: Discussed with patient 's daughter and all questions answered to satisfaction.  Consults called: Cardiology, orthopedics Admission status :IInpatient   Amber Corralejo, DO [facility] Direct contact: see [email or web address]  7PM-7AM contact night coverage as above 11/04/2020, 6:04 PM  LOS: 1 day

## 2020-11-04 NOTE — Progress Notes (Signed)
Amber [name] [id number] [facility]) Hospitalized Hospice Patient   Amber [name] is a current hospice patient with [facility] with a terminal diagnosis of CKD stage 3 with chronic combined CHF. EMS was initiated by family with report of fall, noted right ankle deformity. Patient was initially transported to [facility] ED and then later to [facility]. She is admitted with a right trimalleolar ankle fracture. This is a related admission per Dr. Karie Georges with [facility].   Met with patient and daughter Amber [name] in room. RN Beverline at bedside assisting pt to recliner.  Pt alert to self, denies pain when atrest, respirations even/unlabored.  Report exchanged with RN. INT 1.9 today.  Daughter Amber [name] shares concern about how surgery will go. Therapeutic listening provided.  Pt continues to be appropriate for general inpatient status to do the need to assess and monitor in preparation for surgery planned likely for tomorrow which may require vitamin K administration.   V/S: T 97.6 axillary, HR 66, RR 18, BP 129/68, spO2 95% on room air I&O: 1040/900 Labs: Creatnine 1.11, GFR 49, Ca 8.8, INR 1.9 Diagnostics: none new IV/PRNs: oxy IR 5 mg PO x o, no other PRN meds given   Problem List:   .Right trimalleolar ankle fracture: Reduced in the ED and now placed in a splint.  Seen by orthopedics, Coumadin held and plan for definitive management/ORIF probably next week.  Patient to be admitted to [facility] per orthopedics.  Supportive management with analgesics as needed.Cardiology was consulted to evaluate perioperative cardiovascular risk for this patient. She is at relatively high risk. However patient's daughter still wishes to go forward with the procedure as not fixing the patient would render the patient completely immobile and severely reduce her quality of life. Orthopedic surgery will go forward with surgery once swelling in the limb is decreased and INR has normalized. Anticipating surgery on Monday.   2.  History of  ischemic CVA in 2018 with residual left-sided weakness, noted to be on Coumadin as well as?  Plavix as outpatient.  Holding both in anticipation of surgical intervention.  Okay to continue aspirin for now as discussed with cardiology/orthopedics   3. Combined systolic/diastolic CHF: Appears overall compensated.  Patient initially diagnosed with diastolic CHF and most recent echo showed wall motion abnormalities as well as reduced EF at 35 to 40%.  Patient expressed wish for comfort/nonaggressive interventions and did not want to be hospitalized at which time daughter decided on hospice services for patient.  Currently patient does not have any leg swellings but abdomen noted to be somewhat distended-according to daughter she usually develops abdominal swelling of pulmonary edema due to CHF.  Can check BNP.   4.  Asthma/COPD (FEV1 66%): Not on home O2.  Stable currently on room air.  Was transiently hypoxic after receiving sedation for reduction in the ED.  Pulmonary toilet in the perioperative setting, incentive spirometry ordered.   5.  CAD, chronic atrial fibrillation, aortic stenosis: Discussed with cardiology who recommended to resume home medications.  Okay to hold Coumadin and discontinue Plavix, recommended aspirin for maintenance therapy as of now.  Patient does have operative risk given multiple comorbidities and advanced age but daughter understands and wishes for patient to undergo procedure for quality of life as she is quiet functional at baseline   6.  Hypertension, hyperlipidemia, hypothyroidism: Resume home medications.   7.  Dementia/depression: Resume home medications.  Patient pleasantly confused at this time.  In a cheerful mood today.   Discharge planning: Ongoing.  Pt lives at home with her husband. Her two daughters check on them frequently and are actively involved in their care. Ultimately, they would like for her to return home with hospice but that will depend on if she is able  to be mobilized post surgery.  IDT: updated GOC: Clear. DNR. Surgery to repair fx to hopefully allow her to return back home. Family: Present and update on plan of care.    Should pt require ambulance transport, please use [facility] as they contract this service for our active hospice patients.  Thank you for the opportunity to participate in this pt's care.  Domenic Moras, BSN, RN [facility] Liaison [phone]

## 2020-11-04 NOTE — Plan of Care (Signed)

## 2020-11-04 NOTE — Progress Notes (Signed)
Inpatient Rehab Admissions Coordinator:   Per therapy recommendations, patient was screened for CIR candidacy by Ayslin Kundert, MS, CCC-SLP. At this time, Pt. does not appear to demonstrate medical necessity to justify in hospital rehabilitation/CIR. will not pursue a rehab consult for this Pt.   Recommend other rehab venues to be pursued.  Please contact me with any questions.  Seleen Walter, MS, CCC-SLP Rehab Admissions Coordinator  [phone] (celll) [phone] (office)   

## 2020-11-05 ENCOUNTER — Inpatient Hospital Stay (HOSPITAL_COMMUNITY): Admitting: Anesthesiology

## 2020-11-05 ENCOUNTER — Inpatient Hospital Stay (HOSPITAL_COMMUNITY)

## 2020-11-05 ENCOUNTER — Encounter (HOSPITAL_COMMUNITY): Payer: Self-pay | Admitting: Internal Medicine

## 2020-11-05 ENCOUNTER — Encounter (HOSPITAL_COMMUNITY)
Admission: EM | Disposition: A | Discharge status: Home Health | Payer: Self-pay | Source: Home / Self Care | Attending: Internal Medicine

## 2020-11-05 DIAGNOSIS — I679 Cerebrovascular disease, unspecified: Secondary | ICD-10-CM | POA: Diagnosis not present

## 2020-11-05 DIAGNOSIS — J449 Chronic obstructive pulmonary disease, unspecified: Secondary | ICD-10-CM | POA: Diagnosis not present

## 2020-11-05 DIAGNOSIS — I482 Chronic atrial fibrillation, unspecified: Secondary | ICD-10-CM | POA: Diagnosis not present

## 2020-11-05 DIAGNOSIS — S82891A Other fracture of right lower leg, initial encounter for closed fracture: Secondary | ICD-10-CM | POA: Diagnosis not present

## 2020-11-05 HISTORY — PX: ORIF ANKLE FRACTURE: SHX5408

## 2020-11-05 LAB — PROTIME-INR
INR: 1.3 — ABNORMAL HIGH (ref 0.8–1.2)
Prothrombin Time: 16.5 seconds — ABNORMAL HIGH (ref 11.4–15.2)

## 2020-11-05 LAB — VITAMIN D 25 HYDROXY (VIT D DEFICIENCY, FRACTURES): Vit D, 25-Hydroxy: 127.46 ng/mL — ABNORMAL HIGH (ref 30–100)

## 2020-11-05 LAB — SURGICAL PCR SCREEN
MRSA, PCR: NEGATIVE
Staphylococcus aureus: NEGATIVE

## 2020-11-05 SURGERY — OPEN REDUCTION INTERNAL FIXATION (ORIF) ANKLE FRACTURE
Anesthesia: Monitor Anesthesia Care | Site: Ankle | Laterality: Right

## 2020-11-05 MED ORDER — VANCOMYCIN HCL 500 MG IV SOLR
INTRAVENOUS | Status: AC
  Filled 2020-11-05: qty 10

## 2020-11-05 MED ORDER — DEXAMETHASONE SODIUM PHOSPHATE 10 MG/ML IJ SOLN
INTRAMUSCULAR | Status: AC
  Filled 2020-11-05: qty 1

## 2020-11-05 MED ORDER — ONDANSETRON HCL 4 MG/2ML IJ SOLN
INTRAMUSCULAR | Status: DC | PRN
  Administered 2020-11-05: 4 mg via INTRAVENOUS

## 2020-11-05 MED ORDER — FENTANYL CITRATE (PF) 100 MCG/2ML IJ SOLN: 25.0000 ug | INTRAMUSCULAR | Status: DC | PRN

## 2020-11-05 MED ORDER — CHLORHEXIDINE GLUCONATE 0.12 % MT SOLN
OROMUCOSAL | Status: AC
  Filled 2020-11-05: qty 15

## 2020-11-05 MED ORDER — PROPOFOL 1000 MG/100ML IV EMUL
INTRAVENOUS | Status: AC
  Filled 2020-11-05: qty 100

## 2020-11-05 MED ORDER — MIDAZOLAM HCL 2 MG/2ML IJ SOLN
INTRAMUSCULAR | Status: AC
  Filled 2020-11-05: qty 2

## 2020-11-05 MED ORDER — VANCOMYCIN HCL 500 MG IV SOLR
INTRAVENOUS | Status: DC | PRN
  Administered 2020-11-05: 500 mg

## 2020-11-05 MED ORDER — ORAL CARE MOUTH RINSE: 15.0000 mL | Freq: Once | OROMUCOSAL | Status: DC

## 2020-11-05 MED ORDER — CEFAZOLIN SODIUM-DEXTROSE 2-4 GM/100ML-% IV SOLN
2.0000 g | Freq: Three times a day (TID) | INTRAVENOUS | Status: AC
  Administered 2020-11-05 – 2020-11-06 (×3): 2 g via INTRAVENOUS
  Filled 2020-11-05 (×3): qty 100

## 2020-11-05 MED ORDER — ONDANSETRON HCL 4 MG/2ML IJ SOLN
4.0000 mg | Freq: Once | INTRAMUSCULAR | Status: AC | PRN
Start: 2020-11-05 — End: 2020-11-05
  Administered 2020-11-05: 4 mg via INTRAVENOUS

## 2020-11-05 MED ORDER — SODIUM CHLORIDE 0.9 % IV SOLN: INTRAVENOUS | Status: DC

## 2020-11-05 MED ORDER — BUPIVACAINE-EPINEPHRINE (PF) 0.5% -1:200000 IJ SOLN
INTRAMUSCULAR | Status: DC | PRN
  Administered 2020-11-05: 30 mL via PERINEURAL

## 2020-11-05 MED ORDER — METOCLOPRAMIDE HCL 5 MG/ML IJ SOLN: 5.0000 mg | Freq: Three times a day (TID) | INTRAMUSCULAR | Status: DC | PRN

## 2020-11-05 MED ORDER — METOCLOPRAMIDE HCL 5 MG PO TABS
5.0000 mg | ORAL_TABLET | Freq: Three times a day (TID) | ORAL | Status: DC | PRN
Start: 2020-11-05 — End: 2020-11-07

## 2020-11-05 MED ORDER — ROCURONIUM BROMIDE 10 MG/ML (PF) SYRINGE
PREFILLED_SYRINGE | INTRAVENOUS | Status: AC
  Filled 2020-11-05: qty 10

## 2020-11-05 MED ORDER — CEFAZOLIN SODIUM-DEXTROSE 2-3 GM-%(50ML) IV SOLR
INTRAVENOUS | Status: DC | PRN
  Administered 2020-11-05: 2 g via INTRAVENOUS

## 2020-11-05 MED ORDER — LIDOCAINE HCL (PF) 2 % IJ SOLN
INTRAMUSCULAR | Status: AC
  Filled 2020-11-05: qty 5

## 2020-11-05 MED ORDER — FENTANYL CITRATE PF 50 MCG/ML IJ SOSY
100.0000 ug | PREFILLED_SYRINGE | Freq: Once | INTRAMUSCULAR | Status: DC
Start: 2020-11-05 — End: 2020-11-07

## 2020-11-05 MED ORDER — 0.9 % SODIUM CHLORIDE (POUR BTL) OPTIME
TOPICAL | Status: DC | PRN
  Administered 2020-11-05: 1000 mL

## 2020-11-05 MED ORDER — PROPOFOL 500 MG/50ML IV EMUL
INTRAVENOUS | Status: DC | PRN
  Administered 2020-11-05: 75 ug/kg/min via INTRAVENOUS

## 2020-11-05 MED ORDER — WARFARIN - PHARMACIST DOSING INPATIENT: Freq: Every day | Status: DC

## 2020-11-05 MED ORDER — CHLORHEXIDINE GLUCONATE 0.12 % MT SOLN: 15.0000 mL | Freq: Once | OROMUCOSAL | Status: DC

## 2020-11-05 MED ORDER — CEFAZOLIN SODIUM-DEXTROSE 2-4 GM/100ML-% IV SOLN
INTRAVENOUS | Status: AC
  Administered 2020-11-05: 2000 mg
  Filled 2020-11-05: qty 100

## 2020-11-05 MED ORDER — FENTANYL CITRATE (PF) 250 MCG/5ML IJ SOLN
INTRAMUSCULAR | Status: AC
  Filled 2020-11-05: qty 5

## 2020-11-05 MED ORDER — ONDANSETRON HCL 4 MG/2ML IJ SOLN
INTRAMUSCULAR | Status: AC
  Filled 2020-11-05: qty 2

## 2020-11-05 MED ORDER — WARFARIN SODIUM 7.5 MG PO TABS
7.5000 mg | ORAL_TABLET | Freq: Once | ORAL | Status: AC
  Administered 2020-11-05: 7.5 mg via ORAL
  Filled 2020-11-05 (×2): qty 1

## 2020-11-05 MED ORDER — PROPOFOL 10 MG/ML IV BOLUS
INTRAVENOUS | Status: AC
  Filled 2020-11-05: qty 20

## 2020-11-05 MED ORDER — DOCUSATE SODIUM 100 MG PO CAPS
100.0000 mg | ORAL_CAPSULE | Freq: Two times a day (BID) | ORAL | Status: DC
  Administered 2020-11-05 – 2020-11-07 (×5): 100 mg via ORAL
  Filled 2020-11-05 (×7): qty 1

## 2020-11-05 MED ORDER — LACTATED RINGERS IV SOLN: INTRAVENOUS | Status: DC | PRN

## 2020-11-05 MED ORDER — FENTANYL CITRATE (PF) 100 MCG/2ML IJ SOLN
INTRAMUSCULAR | Status: AC
  Administered 2020-11-05: 100 ug
  Filled 2020-11-05: qty 2

## 2020-11-05 SURGICAL SUPPLY — 75 items
APL PRP STRL LF DISP 70% ISPRP (MISCELLANEOUS) ×1
BAG COUNTER SPONGE SURGICOUNT (BAG) ×2 IMPLANT
BAG SPNG CNTER NS LX DISP (BAG) ×1
BANDAGE ESMARK 6X9 LF (GAUZE/BANDAGES/DRESSINGS) ×1 IMPLANT
BIT DRILL QC 2.0 SHORT EVOS SM (DRILL) IMPLANT
BIT DRILL QC 2.5MM SHRT EVO SM (DRILL) IMPLANT
BNDG CMPR 9X6 STRL LF SNTH (GAUZE/BANDAGES/DRESSINGS) ×1
BNDG COHESIVE 4X5 TAN STRL (GAUZE/BANDAGES/DRESSINGS) ×2 IMPLANT
BNDG ELASTIC 4X5.8 VLCR STR LF (GAUZE/BANDAGES/DRESSINGS) ×1 IMPLANT
BNDG ELASTIC 6X5.8 VLCR STR LF (GAUZE/BANDAGES/DRESSINGS) ×1 IMPLANT
BNDG ESMARK 6X9 LF (GAUZE/BANDAGES/DRESSINGS) ×2
BRUSH SCRUB EZ PLAIN DRY (MISCELLANEOUS) ×4 IMPLANT
CHLORAPREP W/TINT 26 (MISCELLANEOUS) ×2 IMPLANT
COVER SURGICAL LIGHT HANDLE (MISCELLANEOUS) ×2 IMPLANT
DRAPE C-ARM 42X72 X-RAY (DRAPES) ×2 IMPLANT
DRAPE C-ARMOR (DRAPES) ×2 IMPLANT
DRAPE ORTHO SPLIT 77X108 STRL (DRAPES) ×4
DRAPE SURG ORHT 6 SPLT 77X108 (DRAPES) ×2 IMPLANT
DRAPE U-SHAPE 47X51 STRL (DRAPES) ×2 IMPLANT
DRILL QC 2.0 SHORT EVOS SM (DRILL) ×2
DRILL QC 2.5MM SHORT EVOS SM (DRILL) ×2
DRSG ADAPTIC 3X8 NADH LF (GAUZE/BANDAGES/DRESSINGS) IMPLANT
DRSG MEPITEL 4X7.2 (GAUZE/BANDAGES/DRESSINGS) ×1 IMPLANT
ELECT REM PT RETURN 9FT ADLT (ELECTROSURGICAL) ×2
ELECTRODE REM PT RTRN 9FT ADLT (ELECTROSURGICAL) ×1 IMPLANT
GAUZE SPONGE 4X4 12PLY STRL (GAUZE/BANDAGES/DRESSINGS) IMPLANT
GAUZE SPONGE 4X4 12PLY STRL LF (GAUZE/BANDAGES/DRESSINGS) ×1 IMPLANT
GLOVE SURG ENC MOIS LTX SZ6.5 (GLOVE) ×6 IMPLANT
GLOVE SURG ENC MOIS LTX SZ7.5 (GLOVE) ×6 IMPLANT
GLOVE SURG UNDER POLY LF SZ6.5 (GLOVE) ×2 IMPLANT
GLOVE SURG UNDER POLY LF SZ7.5 (GLOVE) ×2 IMPLANT
GOWN STRL REUS W/ TWL LRG LVL3 (GOWN DISPOSABLE) ×2 IMPLANT
GOWN STRL REUS W/TWL LRG LVL3 (GOWN DISPOSABLE) ×4
KIT TURNOVER KIT B (KITS) ×2 IMPLANT
MANIFOLD NEPTUNE II (INSTRUMENTS) ×2 IMPLANT
NDL HYPO 21X1.5 SAFETY (NEEDLE) IMPLANT
NDL HYPO 25GX1X1/2 BEV (NEEDLE) ×1 IMPLANT
NEEDLE HYPO 21X1.5 SAFETY (NEEDLE) IMPLANT
NEEDLE HYPO 25GX1X1/2 BEV (NEEDLE) ×2 IMPLANT
NS IRRIG 1000ML POUR BTL (IV SOLUTION) ×2 IMPLANT
PACK TOTAL JOINT (CUSTOM PROCEDURE TRAY) ×2 IMPLANT
PAD ABD 7.5X8 STRL (GAUZE/BANDAGES/DRESSINGS) ×1 IMPLANT
PAD ARMBOARD 7.5X6 YLW CONV (MISCELLANEOUS) ×4 IMPLANT
PAD CAST 4YDX4 CTTN HI CHSV (CAST SUPPLIES) IMPLANT
PADDING CAST ABS 4INX4YD NS (CAST SUPPLIES) ×2
PADDING CAST ABS 6INX4YD NS (CAST SUPPLIES) ×1
PADDING CAST ABS COTTON 4X4 ST (CAST SUPPLIES) IMPLANT
PADDING CAST ABS COTTON 6X4 NS (CAST SUPPLIES) IMPLANT
PADDING CAST COTTON 4X4 STRL (CAST SUPPLIES)
PADDING CAST COTTON 6X4 STRL (CAST SUPPLIES) IMPLANT
PLATE FIB EVOS 2.7/3.5 7H R103 (Plate) ×1 IMPLANT
SCREW CORT 2.7X16 ST EVOS (Screw) ×1 IMPLANT
SCREW CORT 2.7X16 STAR T8 EVOS (Screw) ×1 IMPLANT
SCREW CORT 2.7X17 T8 ST EVOS (Screw) ×3 IMPLANT
SCREW CORT 3.5X11 ST EVOS (Screw) ×2 IMPLANT
SCREW CORT EVOS ST 3.5X12 (Screw) ×1 IMPLANT
SCREW CORT ST EVOS 3.5X60 (Screw) ×1 IMPLANT
SCREW CTX 3.5X50MM EVOS (Screw) ×2 IMPLANT
SCREW EVOS 2.7X18 LOCK T8 (Screw) ×3 IMPLANT
SCREW LOCK ST EVOS 3.5X10 (Screw) ×1 IMPLANT
SPONGE T-LAP 18X18 ~~LOC~~+RFID (SPONGE) IMPLANT
STAPLER VISISTAT 35W (STAPLE) ×2 IMPLANT
SUCTION FRAZIER HANDLE 10FR (MISCELLANEOUS) ×2
SUCTION TUBE FRAZIER 10FR DISP (MISCELLANEOUS) ×1 IMPLANT
SUT ETHILON 3 0 PS 1 (SUTURE) ×4 IMPLANT
SUT PROLENE 0 CT (SUTURE) IMPLANT
SUT VIC AB 0 CT1 27 (SUTURE) ×2
SUT VIC AB 0 CT1 27XBRD ANBCTR (SUTURE) ×1 IMPLANT
SUT VIC AB 2-0 CT1 27 (SUTURE) ×4
SUT VIC AB 2-0 CT1 TAPERPNT 27 (SUTURE) ×2 IMPLANT
SYR CONTROL 10ML LL (SYRINGE) ×2 IMPLANT
TOWEL GREEN STERILE (TOWEL DISPOSABLE) ×4 IMPLANT
TOWEL GREEN STERILE FF (TOWEL DISPOSABLE) ×2 IMPLANT
UNDERPAD 30X36 HEAVY ABSORB (UNDERPADS AND DIAPERS) ×2 IMPLANT
WATER STERILE IRR 1000ML POUR (IV SOLUTION) ×2 IMPLANT

## 2020-11-05 NOTE — Anesthesia Procedure Notes (Signed)
Anesthesia Regional Block: Popliteal block   Pre-Anesthetic Checklist: , timeout performed,  Correct Patient, Correct Site, Correct Laterality,  Correct Procedure, Correct Position, site marked,  Risks and benefits discussed,  Surgical consent,  Pre-op evaluation,  At surgeon's request and post-op pain management  Laterality: Right  Prep: chloraprep       Needles:   Needle Type: Echogenic Stimulator Needle     Needle Length: 10cm  Needle Gauge: 21   Needle insertion depth: 6 cm   Additional Needles:   Procedures:,,,, ultrasound used (permanent image in chart),,    Narrative:  Start time: 11/05/2020 9:56 AM End time: 11/05/2020 10:01 AM Injection made incrementally with aspirations every 5 mL.  Performed by: Personally  Anesthesiologist: Josephine Igo, MD  Additional Notes: Timeout performed. Patient sedated. Relevant anatomy ID'd using [place]. Incremental 2-5ml injection of LA with frequent aspiration. Patient tolerated procedure well.    Right Popliteal Block

## 2020-11-05 NOTE — Care Management Important Message (Signed)
Important Message  Patient Details  Name: Amber [name] MRN: [id number] Date of Birth: [birth year]   Medicare Important Message Given:  Yes     Memory Argue 11/05/2020, 3:51 PM

## 2020-11-05 NOTE — Anesthesia Preprocedure Evaluation (Signed)
Anesthesia Evaluation  Patient identified by MRN, date of birth, ID band Patient awake    Reviewed: Allergy & Precautions, NPO status , Patient's Chart, lab work & pertinent test results  Airway Mallampati: II  TM Distance: >3 FB Neck ROM: Full    Dental  (+) Edentulous Upper, Edentulous Lower   Pulmonary asthma , pneumonia, resolved, COPD,  COPD inhaler, former smoker,    Pulmonary exam normal breath sounds clear to auscultation       Cardiovascular hypertension, Pt. on medications and Pt. on home beta blockers + angina with exertion + CAD, + CABG and +CHF  + dysrhythmias Atrial Fibrillation  Rhythm:Regular Rate:Normal  EKG 11/01/20 Atrial fibrillation  Echo 11/02/20 1. Left ventricular ejection fraction, by estimation, is 50 to 55%. The left ventricle has low normal function. The left ventricle demonstrates regional wall motion abnormalities (see scoring diagram/findings for description). Left ventricular diastolic function could not be evaluated. There is mild hypokinesis of the left ventricular, basal-mid anteroseptal wall.  2. Right ventricular systolic function is normal. The right ventricular size is normal. There is normal pulmonary artery systolic pressure.  3. Left atrial size was moderately dilated.  4. Right atrial size was mild to moderately dilated.  5. The mitral valve is grossly normal. Trivial mitral valve  regurgitation. Moderate to severe mitral annular calcification.  6. The aortic valve is calcified. There is moderate calcification of the aortic valve. There is moderate thickening of the aortic valve. Aortic valve regurgitation is not visualized. Moderate aortic valve stenosis. Aortic valve mean gradient measures 21.8 mmHg. Aortic valve Vmax measures 3.04 m/s.  7. The inferior vena cava is dilated in size with >50% respiratory variability, suggesting right atrial pressure of 8 mmHg.    Neuro/Psych PSYCHIATRIC  DISORDERS Depression Dementia Left sided weakness  Neuromuscular disease CVA, Residual Symptoms    GI/Hepatic Neg liver ROS, GERD  Medicated,  Endo/Other  Hypothyroidism Hyperlipidemia  Renal/GU Renal InsufficiencyRenal disease  negative genitourinary   Musculoskeletal  (+) Arthritis , Osteoarthritis,  Fibromyalgia -Bimalleolar right ankle Fx   Abdominal   Peds  Hematology Coumnadin Therapy last dose 5 days ago Plavix therapy- last dose yesterday   Anesthesia Other Findings   Reproductive/Obstetrics                             Anesthesia Physical Anesthesia Plan  ASA: 3  Anesthesia Plan: Regional and MAC   Post-op Pain Management:    Induction: Intravenous  PONV Risk Score and Plan: 3 and Treatment may vary due to age or medical condition, Ondansetron and Propofol infusion  Airway Management Planned: Natural Airway and Simple Face Mask  Additional Equipment:   Intra-op Plan:   Post-operative Plan:   Informed Consent: I have reviewed the patients History and Physical, chart, labs and discussed the procedure including the risks, benefits and alternatives for the proposed anesthesia with the patient or authorized representative who has indicated his/her understanding and acceptance.   Patient has DNR.  Discussed DNR with power of attorney and Suspend DNR.   Dental advisory given  Plan Discussed with: CRNA and Anesthesiologist  Anesthesia Plan Comments:         Anesthesia Quick Evaluation

## 2020-11-05 NOTE — Progress Notes (Signed)
PT Cancellation Note  Patient Details Name: Amber [name] MRN: [id number] DOB: Oct 04, 1934   Cancelled Treatment:    Reason Eval/Treat Not Completed: Patient at procedure or test/unavailable (Pt. leaving floor now for surgical fixation of R ankle fx.  PT to f/u in AM.)  Janyia Guion A. Lainy Wrobleski, PT, DPT Acute Rehabilitation Services Office: [place] 11/05/2020, 9:07 AM

## 2020-11-05 NOTE — Interval H&P Note (Signed)
History and Physical Interval Note:  11/05/2020 9:46 AM  Amber [name]  has presented today for surgery, with the diagnosis of Right ankle fracture.  The various methods of treatment have been discussed with the patient and family. After consideration of risks, benefits and other options for treatment, the patient has consented to  Procedure(s): OPEN REDUCTION INTERNAL FIXATION (ORIF) ANKLE FRACTURE (Right) as a surgical intervention.  The patient's history has been reviewed, patient examined, no change in status, stable for surgery.  I have reviewed the patient's chart and labs.  Questions were answered to the patient's satisfaction.     Lennette Bihari P Kanye Depree

## 2020-11-05 NOTE — Transfer of Care (Signed)
Immediate Anesthesia Transfer of Care Note  Patient: Amber [name]  Procedure(s) Performed: OPEN REDUCTION INTERNAL FIXATION (ORIF) ANKLE FRACTURE (Right: Ankle)  Patient Location: PACU  Anesthesia Type:MAC combined with regional for post-op pain  Level of Consciousness: drowsy  Airway & Oxygen Therapy: Patient Spontanous Breathing and Patient connected to face mask oxygen  Post-op Assessment: Report given to RN and Post -op Vital signs reviewed and stable  Post vital signs: Reviewed and stable  Last Vitals:  Vitals Value Taken Time  BP    Temp    Pulse    Resp    SpO2      Last Pain:  Vitals:   11/05/20 0915  TempSrc: Oral  PainSc:       Patients Stated Pain Goal: 0 (45/99/77 [id number])  Complications: No notable events documented.

## 2020-11-05 NOTE — Anesthesia Postprocedure Evaluation (Signed)
Anesthesia Post Note  Patient: Amber [name]  Procedure(s) Performed: OPEN REDUCTION INTERNAL FIXATION (ORIF) ANKLE FRACTURE (Right: Ankle)     Patient location during evaluation: PACU Anesthesia Type: Regional Level of consciousness: awake and alert Pain management: pain level controlled Vital Signs Assessment: post-procedure vital signs reviewed and stable Respiratory status: spontaneous breathing, nonlabored ventilation, respiratory function stable and patient connected to nasal cannula oxygen Cardiovascular status: stable and blood pressure returned to baseline Postop Assessment: no apparent nausea or vomiting Anesthetic complications: no   No notable events documented.  Last Vitals:  Vitals:   11/05/20 1236 11/05/20 1426  BP: 139/63 (!) 114/39  Pulse: 75 71  Resp: 18 19  Temp: (!) 36.4 C (!) 36.3 C  SpO2: 98% 95%    Last Pain:  Vitals:   11/05/20 1426  TempSrc: Oral  PainSc:                  Danford Tat A.

## 2020-11-05 NOTE — Op Note (Signed)
Orthopaedic Surgery Operative Note (CSN: [id number] ) Date of Surgery: 11/05/2020  Admit Date: 11/01/2020   Diagnoses: Pre-Op Diagnoses: Right trimalleolar ankle fracture/dislocation  Post-Op Diagnosis: Same  Procedures: CPT 78588-FOYD reduction internal fixation of right trimalleolar ankle fracture CPT 27829-Open reduction internal fixation of right syndesmosis  Surgeons : Primary: Onofrio Klemp, Thomasene Lot, MD  Assistant: Patrecia Pace, PA-C  Location: OR 3   Anesthesia:Regional with sedation   Antibiotics: Ancef 2g preop with 500mg  vancomycin powder   Tourniquet time:None used    Estimated Blood XAJO:87 mL  Complications:None  Specimens:None   Implants: Implant Name Type Inv. Item Serial No. Manufacturer Lot No. LRB No. Used Action  SCREW CORT EVOS ST 3.5X12 - [id number] Screw SCREW CORT EVOS ST 3.5X12  [employer]  Right 1 Implanted  SCREW CORT 2.7X16 ST EVOS - [id number] Screw SCREW CORT 2.7X16 ST EVOS  [employer]  Right 1 Implanted  SCREW CORT 2.7X17 T8 ST EVOS - [id number] Screw SCREW CORT 2.7X17 T8 ST EVOS  [employer]  Right 3 Implanted  SCREW EVOS 2.7X18 LOCK T8 - [id number] Screw SCREW EVOS 2.7X18 LOCK T8  [employer]  Right 3 Implanted  SCREW CORT 2.7X16 STAR T8 EVOS - [id number] Screw SCREW CORT 2.7X16 STAR T8 EVOS  [employer]  Right 1 Implanted  SCREW CORT 3.5X11 ST EVOS - [id number] Screw SCREW CORT 3.5X11 ST EVOS  [employer]  Right 2 Implanted  SCREW LOCK ST EVOS 3.5X10 - [id number] Screw SCREW LOCK ST EVOS 3.5X10  [employer]  Right 1 Implanted  SCREW CTX 3.5X50MM EVOS - [id number] Screw SCREW CTX 3.5X50MM EVOS  [employer]  Right 2 Implanted  SCREW CORT ST EVOS 3.5X60 - [id number] Screw SCREW CORT ST EVOS 3.5X60  [employer]  Right 1 Implanted  PLATE FIB EVOS 3.0/0.7 7H R103 - [id number] Plate PLATE FIB EVOS 5.6/2.5 7H R103   [employer]  Right 1 Implanted     Indications for Surgery: 86 year old female who sustained a right trimalleolar ankle fracture dislocation.  She was reduced by the emergency department.  Due to the unstable nature of her injury and the need for weightbearing on the lower extremity I discussed risks and benefits of intervention with surgical intervention.  The patient's daughters agreed to proceed with surgery after full risk-benefit discussion.  Risks included but not limited to bleeding, infection, malunion, nonunion, hardware failure, hardware irritation, nerve or blood vessel injury, posttraumatic arthritis, ankle stiffness, DVT, even the possibility of anesthetic complications.  They agreed to proceed with surgery and consent was obtained.  Operative Findings: 1.  Open reduction internal fixation of right trimalleolar ankle fracture using [employer] EVOS 3.5/2.7 mm distal fibular locking plate with independent 3.5 mm medial malleolus screws 2.  Open reduction internal fixation of right ankle syndesmosis using quadracortical 3.5 millimeter screw  Procedure: The patient was identified in the preoperative holding area. Consent was confirmed with the patient and their family and all questions were answered. The operative extremity was marked after confirmation with the patient. she was then brought back to the operating room by our anesthesia colleagues.  She was carefully transferred over to a radiolucent flat top table.  A bump was placed under her operative hip.  The right lower extremity was then prepped and draped in usual sterile fashion.  A timeout was performed to verify the patient, the procedure, and the extremity.  Preoperative antibiotics were dosed.  Fluoroscopic imaging was obtained to show the unstable nature of her injury.  I did elect to lateral approach was made and carried down through skin and subcutaneous tissue.  The fracture site was exposed.  Reduction was  performed and adequate reduction and length of the fibula was obtained.  I then chose a [employer] EVOS 3.5/2.7 mm distal fibular locking plate and held this provisionally with a K wire.  I then placed a nonlocking screw into the fibular shaft to bring the plate flush to bone.  I then drilled and placed a 2.7 mm nonlocking screw to bring the distal portion of plate flush to bone.  I then placed nonlocking screws in the distal segment.  I returned to the proximal shaft segment and I placed 3 more nonlocking screws.  As I had distal fibular fixation and made a curvilinear incision over the medial malleolus.  Carried it down through skin and subcutaneous tissue.  I exposed and dissected out the neurovascular structures.  I then used a reduction tenaculum to anatomically reduce the medial malleolus.  I then drilled and placed 3.5 millimeter screws gain bicortical fixation of the medial malleolus.  Anatomic reduction was performed.  An external rotation stress view was performed.  This showed some minimal medial clear space widening.  As result of early weightbearing I felt that fixation of the syndesmosis would be most appropriate.  The foot was dorsiflexed.  Provided a medial force to the fibula and drilled and placed a 4 cortices 3.5 millimeter screw across the fibula and tibia.  Final fluoroscopic imaging was obtained.  The incision was copiously irrigated.  A gram of vancomycin powder was placed into the incision.  A layered closure of 2-0 Vicryl and 3-0 nylon was used to close the skin.  Sterile dressing was applied.  The patient was then placed in a boot.  Post Op Plan/Instructions: Patient will be weightbearing as tolerated to right lower extremity.  She will receive postoperative Ancef.  She may be restarted on her Coumadin this evening.  We will have her mobilize with physical and Occupational Therapy.  I was present and performed the entire surgery.  Patrecia Pace, PA-C did assist me throughout  the case. An assistant was necessary given the difficulty in approach, maintenance of reduction and ability to instrument the fracture.   Katha Hamming, MD [facility]

## 2020-11-05 NOTE — Progress Notes (Signed)
[name] [phone] [facility]) Hospitalized Hospice Patient   Mrs. Porro is a current hospice patient with [facility] with a terminal diagnosis of CKD stage 3 with chronic combined CHF. EMS was initiated by family with report of fall, noted right ankle deformity. Patient was initially transported to [facility] ED and then later to [facility]. She is admitted with a right trimalleolar ankle fracture. This is a related admission per Dr. Karie Georges with [facility].  Visited patient at bedside following her surgery today. She was sleeping and did not wake to my voice. Daughter at bedside. Patient appears comfortable and is wearing an orthopedic boot on surgical ankle. Daughter reports patient received a block in the ankle and propofol for sedation, which she tolerated well. Daughter states patient ate a few bites of food with some pain medication shortly before my visit.   Pt continues to be appropriate for general inpatient status to do the need to assess and monitor following orthopedic surgery for ankle.   VS: 97.3, 114/39, 71, 19, 95% RA I/O: 390/1500 Abn Labs:  11/05/2020 14:04 Vitamin D, 25-Hydroxy: 127.46 (H) Prothrombin Time: 16.5 (H) INR: 1.3 (H) Diagnostics: Ankle Xray IMPRESSION: Intraoperative open reduction internal fixation of the right ankle with no radiographic findings to suggest complication. IMPRESSION: Status post ORIF of right ankle trimalleolar fracture  Problem List:  1.Right trimalleolar ankle fracture:  Procedures: CPT 27822-Open reduction internal fixation of right trimalleolar ankle fracture CPT 27829-Open reduction internal fixation of right syndesmosis   2.  History of ischemic CVA in 2018 with residual left-sided weakness, noted to be on Coumadin as well as?  Plavix as outpatient.  Holding both in anticipation of surgical intervention.  Okay to continue aspirin for now as discussed with cardiology/orthopedics   3. Combined systolic/diastolic CHF: Appears overall  compensated.  Patient initially diagnosed with diastolic CHF and most recent echo showed wall motion abnormalities as well as reduced EF at 35 to 40%.  Patient expressed wish for comfort/nonaggressive interventions and did not want to be hospitalized at which time daughter decided on hospice services for patient.  Currently patient does not have any leg swellings but abdomen noted to be somewhat distended-according to daughter she usually develops abdominal swelling of pulmonary edema due to CHF.  Can check BNP.   4.  Asthma/COPD (FEV1 66%): Not on home O2.  Stable currently on room air.  Was transiently hypoxic after receiving sedation for reduction in the ED.  Pulmonary toilet in the perioperative setting, incentive spirometry ordered.   5.  CAD, chronic atrial fibrillation, aortic stenosis: Discussed with cardiology who recommended to resume home medications.  Okay to hold Coumadin and discontinue Plavix, recommended aspirin for maintenance therapy as of now.  Patient does have operative risk given multiple comorbidities and advanced age but daughter understands and wishes for patient to undergo procedure for quality of life as she is quiet functional at baseline   6.  Hypertension, hyperlipidemia, hypothyroidism: Resume home medications.   7.  Dementia/depression: Resume home medications.  Patient pleasantly confused at this time.  In a cheerful mood today.     Discharge planning: Ongoing. Pt lives at home with her husband. Her two daughters check on them frequently and are actively involved in their care. Ultimately, they would like for her to return home with hospice but that will depend on if she is able to be mobilized post surgery. Considering Rehab but would prefer to take her home if possible.  IDT: updated GOC: Clear. DNR. Surgery  to repair fx to hopefully allow her to return back home. Family: Present and update on plan of care.    Should pt require ambulance transport, please use [facility]  as they contract this service for our active hospice patients.  Please do not hesitate to call with questions.   Thank you,   Farrel Gordon, RN, [facility] Liaison   [phone]

## 2020-11-05 NOTE — Anesthesia Procedure Notes (Signed)
Procedure Name: MAC Date/Time: 11/05/2020 10:21 AM Performed by: Kyung Rudd, CRNA Pre-anesthesia Checklist: Patient identified, Emergency Drugs available, Suction available, Patient being monitored and Timeout performed Patient Re-evaluated:Patient Re-evaluated prior to induction Oxygen Delivery Method: Simple face mask Induction Type: IV induction Placement Confirmation: positive ETCO2

## 2020-11-05 NOTE — Progress Notes (Signed)
ANTICOAGULATION CONSULT NOTE - Initial Consult  Pharmacy Consult for Warfarin Indication: atrial fibrillation  Allergies  Allergen Reactions   Ezetimibe Other (See Comments)    Myalgia   Welchol [Colesevelam Hcl] Other (See Comments)    Muscle aches   Adhesive [Tape] Itching, Rash and Other (See Comments)    Burning    Ceclor [Cefaclor] Other (See Comments)    Unknown allergic reaction   Elastic Bandages & [Zinc] Rash and Other (See Comments)    Turns red on the areas it touches   Latex Itching, Rash and Other (See Comments)    Burning    Lipitor [Atorvastatin Calcium] Other (See Comments)    Increased fibromyalgia pain   Mevacor [Lovastatin] Other (See Comments)    Increased fibromyalgia pain   Pravachol Other (See Comments)    Increased fibromyalgia pain   Vasotec Other (See Comments)    Unknown allergic reaction    Patient Measurements: Height: 5\' 5"  (165.1 cm) Weight: 64 kg (141 lb 1.5 oz) IBW/kg (Calculated) : 57   Vital Signs: Temp: 97.5 F (36.4 C) (09/26 1236) Temp Source: Oral (09/26 1236) BP: 139/63 (09/26 1236) Pulse Rate: 75 (09/26 1236)  Labs: Recent Labs    11/03/20 0323 11/04/20 0148 11/04/20 1819 11/05/20 0849  LABPROT 26.2* 22.0* 17.4* 16.5*  INR 2.4* 1.9* 1.4* 1.3*  CREATININE 1.11*  --   --   --     Estimated Creatinine Clearance: 33.9 mL/min (A) (by C-G formula based on SCr of 1.11 mg/dL (H)).   Medical History: Past Medical History:  Diagnosis Date   Abnormality of gait 08/02/2014   Cerebrovascular disease 05/21/2016   Cervical spinal stenosis    Chronic atrial fibrillation (HCC)    a. chronic/rate controlled;  b. chronic coumadin.   Chronic combined systolic and diastolic CHF (congestive heart failure) (HCC)    CKD (chronic kidney disease), stage III (HCC)    COPD with emphysema (HCC)    PFT 05/02/10>>FEV1 1.35(62%), FEV1% 66, DLCO 75%   Coronary artery disease    a. s/p CABG;  b. Abnormal nuc 2015 - managed medically.   CVA  (cerebral vascular accident) ([place])    left sided weakness   Depression    Fibromyalgia    GERD (gastroesophageal reflux disease)    pepcid 2-3 times per week   Gout    Hemiparesis and alteration of sensations as late effects of stroke ([place]) 01/17/2015   History of melanoma    squamous cell, melanoma   Hyperlipidemia    a. statin intolerant, not felt to be candidate for PCSK9 due to chronically elevated CK levels.   Hypertension    Hypertensive heart disease    Hypothyroidism    Insomnia    Memory change 01/23/2014   Nasal polyposis    Osteoarthritis    Osteoporosis    Pneumonia    1990   Tobacco abuse     Assessment: 86 yo female, s/p fall and ORIF of ankle fracture. On Coumadin PTA for afib. Home dose 7.5 mg on Mond, then 5 mg AOD. Pharmacy consulted to restart Coumadin without bridging therapy on post-op day 0. INR this am 1.3.  Goal of Therapy:  INR 2-3 Monitor platelets by anticoagulation protocol: Yes   Plan:  Warfarin 7.5 mg po x 1 tonight F/u daily INR  Alanda Slim, PharmD, [facility] Clinical Pharmacist Please see AMION for all Pharmacists' Contact Phone Numbers 11/05/2020, 12:46 PM

## 2020-11-06 ENCOUNTER — Encounter (HOSPITAL_COMMUNITY): Payer: Self-pay | Admitting: Student

## 2020-11-06 DIAGNOSIS — S82891A Other fracture of right lower leg, initial encounter for closed fracture: Secondary | ICD-10-CM | POA: Diagnosis not present

## 2020-11-06 DIAGNOSIS — J449 Chronic obstructive pulmonary disease, unspecified: Secondary | ICD-10-CM | POA: Diagnosis not present

## 2020-11-06 DIAGNOSIS — I679 Cerebrovascular disease, unspecified: Secondary | ICD-10-CM | POA: Diagnosis not present

## 2020-11-06 DIAGNOSIS — I482 Chronic atrial fibrillation, unspecified: Secondary | ICD-10-CM | POA: Diagnosis not present

## 2020-11-06 LAB — BASIC METABOLIC PANEL
Anion gap: 7 (ref 5–15)
BUN: 16 mg/dL (ref 8–23)
CO2: 27 mmol/L (ref 22–32)
Calcium: 8.4 mg/dL — ABNORMAL LOW (ref 8.9–10.3)
Chloride: 100 mmol/L (ref 98–111)
Creatinine, Ser: 0.83 mg/dL (ref 0.44–1.00)
GFR, Estimated: >60 mL/min (ref >60)
Glucose, Bld: 133 mg/dL — ABNORMAL HIGH (ref 70–99)
Potassium: 3.9 mmol/L (ref 3.5–5.1)
Sodium: 134 mmol/L — ABNORMAL LOW (ref 135–145)

## 2020-11-06 LAB — CBC
HCT: 33.8 % — ABNORMAL LOW (ref 36.0–46.0)
Hemoglobin: 11 g/dL — ABNORMAL LOW (ref 12.0–15.0)
MCH: 30.1 pg (ref 26.0–34.0)
MCHC: 32.5 g/dL (ref 30.0–36.0)
MCV: 92.6 fL (ref 80.0–100.0)
Platelets: 200 10*3/uL (ref 150–400)
RBC: 3.65 MIL/uL — ABNORMAL LOW (ref 3.87–5.11)
RDW: 14.8 % (ref 11.5–15.5)
WBC: 9.8 10*3/uL (ref 4.0–10.5)
nRBC: 0 % (ref 0.0–0.2)

## 2020-11-06 LAB — PROTIME-INR
INR: 1.4 — ABNORMAL HIGH (ref 0.8–1.2)
Prothrombin Time: 17.2 seconds — ABNORMAL HIGH (ref 11.4–15.2)

## 2020-11-06 MED ORDER — ACETAMINOPHEN 650 MG RE SUPP: 650.0000 mg | Freq: Four times a day (QID) | RECTAL | Status: DC

## 2020-11-06 MED ORDER — WARFARIN SODIUM 7.5 MG PO TABS
7.5000 mg | ORAL_TABLET | Freq: Once | ORAL | Status: AC
  Administered 2020-11-06: 7.5 mg via ORAL
  Filled 2020-11-06: qty 1

## 2020-11-06 MED ORDER — FENTANYL CITRATE PF 50 MCG/ML IJ SOSY: 12.5000 ug | PREFILLED_SYRINGE | INTRAMUSCULAR | Status: DC | PRN

## 2020-11-06 MED ORDER — ACETAMINOPHEN 325 MG PO TABS
650.0000 mg | ORAL_TABLET | Freq: Four times a day (QID) | ORAL | Status: DC
  Administered 2020-11-06 – 2020-11-08 (×6): 650 mg via ORAL
  Filled 2020-11-06 (×7): qty 2

## 2020-11-06 MED ORDER — TRAMADOL HCL 50 MG PO TABS
50.0000 mg | ORAL_TABLET | Freq: Four times a day (QID) | ORAL | Status: DC | PRN
Start: 2020-11-06 — End: 2020-11-07

## 2020-11-06 NOTE — Progress Notes (Signed)
ANTICOAGULATION CONSULT NOTE - Follow Up Consult  Pharmacy Consult for Warfarin Indication: atrial fibrillation  Allergies  Allergen Reactions   Ezetimibe Other (See Comments)    Myalgia   Welchol [Colesevelam Hcl] Other (See Comments)    Muscle aches   Adhesive [Tape] Itching, Rash and Other (See Comments)    Burning    Ceclor [Cefaclor] Other (See Comments)    Unknown allergic reaction   Elastic Bandages & [Zinc] Rash and Other (See Comments)    Turns red on the areas it touches   Latex Itching, Rash and Other (See Comments)    Burning    Lipitor [Atorvastatin Calcium] Other (See Comments)    Increased fibromyalgia pain   Mevacor [Lovastatin] Other (See Comments)    Increased fibromyalgia pain   Pravachol Other (See Comments)    Increased fibromyalgia pain   Vasotec Other (See Comments)    Unknown allergic reaction    Patient Measurements: Height: 5\' 5"  (165.1 cm) Weight: 64 kg (141 lb 1.5 oz) IBW/kg (Calculated) : 57   Vital Signs: Temp: 97.9 F (36.6 C) (09/27 0800) Temp Source: Oral (09/27 0800) BP: 131/91 (09/27 0800) Pulse Rate: 105 (09/27 0800)  Labs: Recent Labs    11/04/20 1819 11/05/20 0849 11/06/20 0333 11/06/20 0454  HGB  --   --  11.0*  --   HCT  --   --  33.8*  --   PLT  --   --  200  --   LABPROT 17.4* 16.5*  --  17.2*  INR 1.4* 1.3*  --  1.4*  CREATININE  --   --  0.83  --      Estimated Creatinine Clearance: 45.4 mL/min (by C-G formula based on SCr of 0.83 mg/dL).   Medical History: Past Medical History:  Diagnosis Date   Abnormality of gait 08/02/2014   Cerebrovascular disease 05/21/2016   Cervical spinal stenosis    Chronic atrial fibrillation (HCC)    a. chronic/rate controlled;  b. chronic coumadin.   Chronic combined systolic and diastolic CHF (congestive heart failure) (HCC)    CKD (chronic kidney disease), stage III (HCC)    COPD with emphysema (HCC)    PFT 05/02/10>>FEV1 1.35(62%), FEV1% 66, DLCO 75%   Coronary artery  disease    a. s/p CABG;  b. Abnormal nuc 2015 - managed medically.   CVA (cerebral vascular accident) ([place])    left sided weakness   Depression    Fibromyalgia    GERD (gastroesophageal reflux disease)    pepcid 2-3 times per week   Gout    Hemiparesis and alteration of sensations as late effects of stroke ([place]) 01/17/2015   History of melanoma    squamous cell, melanoma   Hyperlipidemia    a. statin intolerant, not felt to be candidate for PCSK9 due to chronically elevated CK levels.   Hypertension    Hypertensive heart disease    Hypothyroidism    Insomnia    Memory change 01/23/2014   Nasal polyposis    Osteoarthritis    Osteoporosis    Pneumonia    1990   Tobacco abuse     Assessment: 86 yo female, s/p fall and ORIF of ankle fracture. On Coumadin PTA for afib. Home dose 7.5 mg on Mond, then 5 mg AOD. Pharmacy consulted to restart Coumadin without bridging therapy on post-op day 0.   INR today is subtherapeutic but up slightly to 1.4. H/H mildly low. Plt wnl  Goal of Therapy:  INR 2-3  Monitor platelets by anticoagulation protocol: Yes   Plan:  Warfarin 7.5 mg po x 1 tonight F/u daily INR  Albertina Parr, PharmD., BCPS, BCCCP Clinical Pharmacist Please refer to [facility] for unit-specific pharmacist

## 2020-11-06 NOTE — Progress Notes (Addendum)
Orthopaedic Trauma Progress Note  SUBJECTIVE: Doing ok, very sleepy this morning. Not complaining of any pain currently but does note discomfort when moving or touching the right ankle/leg. Daughter at bedside. Feels like her mom is stiff all over from laying in bed for several days, is hopeful therapies go well and patient will be able to discharge home. She is also concerned the oxycodone is causing patient to be too sleepy. We will switch this to Tramadol which she has taken in the past and tolerated well.   OBJECTIVE:  Vitals:   11/06/20 0650 11/06/20 0800  BP: 118/65 (!) 131/91  Pulse: 94 (!) 105  Resp: 16   Temp: 98.2 F (36.8 C) 97.9 F (36.6 C)  SpO2: 97% 93%    General: Resting comfortably.  Respiratory: No increased work of breathing.  RLE: CAM boot in place. Dressing CDI. Able to wiggle toes a small amount. Endorses sensation to light touch over the forefoot.   IMAGING: Stable post op imaging.   LABS:  Results for orders placed or performed during the hospital encounter of 11/01/20 (from the past 24 hour(s))  Protime-INR     Status: Abnormal   Collection Time: 11/05/20  8:49 AM  Result Value Ref Range   Prothrombin Time 16.5 (H) 11.4 - 15.2 seconds   INR 1.3 (H) 0.8 - 1.2  Surgical pcr screen     Status: None   Collection Time: 11/05/20  9:05 AM   Specimen: Nasal Mucosa; Nasal Swab  Result Value Ref Range   MRSA, PCR NEGATIVE NEGATIVE   Staphylococcus aureus NEGATIVE NEGATIVE  VITAMIN D 25 Hydroxy (Vit-D Deficiency, Fractures)     Status: Abnormal   Collection Time: 11/05/20  2:04 PM  Result Value Ref Range   Vit D, 25-Hydroxy 127.46 (H) 30 - 100 ng/mL  Basic metabolic panel     Status: Abnormal   Collection Time: 11/06/20  3:33 AM  Result Value Ref Range   Sodium 134 (L) 135 - 145 mmol/L   Potassium 3.9 3.5 - 5.1 mmol/L   Chloride 100 98 - 111 mmol/L   CO2 27 22 - 32 mmol/L   Glucose, Bld 133 (H) 70 - 99 mg/dL   BUN 16 8 - 23 mg/dL   Creatinine, Ser 0.83  0.44 - 1.00 mg/dL   Calcium 8.4 (L) 8.9 - 10.3 mg/dL   GFR, Estimated >60 >60 mL/min   Anion gap 7 5 - 15  CBC     Status: Abnormal   Collection Time: 11/06/20  3:33 AM  Result Value Ref Range   WBC 9.8 4.0 - 10.5 K/uL   RBC 3.65 (L) 3.87 - 5.11 MIL/uL   Hemoglobin 11.0 (L) 12.0 - 15.0 g/dL   HCT 33.8 (L) 36.0 - 46.0 %   MCV 92.6 80.0 - 100.0 fL   MCH 30.1 26.0 - 34.0 pg   MCHC 32.5 30.0 - 36.0 g/dL   RDW 14.8 11.5 - 15.5 %   Platelets 200 150 - 400 K/uL   nRBC 0.0 0.0 - 0.2 %  Protime-INR     Status: Abnormal   Collection Time: 11/06/20  4:54 AM  Result Value Ref Range   Prothrombin Time 17.2 (H) 11.4 - 15.2 seconds   INR 1.4 (H) 0.8 - 1.2    ASSESSMENT: Amber [name] is a 86 y.o. female, 1 Day Post-Op s/p ORIF RIGHT ANKLE FRACTURE  CV/Blood loss: Acute blood loss anemia, Hgb 11.0 this morning.  PLAN: Weightbearing: WBAT RLE in CAM  boot ROM: Ok for ankle ROM as tolerated at rest Incisional and dressing care: OK to remove dressings 11/07/20 and leave open to air with dry gauze PRN  Showering: OK to begin showering 11/09/20 if no drainage from incision Orthopedic device(s): CAM boot RLE Pain management:  1. Tylenol 650 mg q 6 hours scheduled 2. Tramadol 50 mg mg q 6 hours PRN 3. Fentanyl 12.5-50 mcg q 2 hours PRN  VTE prophylaxis: Coumadin, SCDs ID:  Ancef 2gm post op Foley/Lines:  No foley, KVO IVFs Impediments to Fracture Healing: Vit D level 127, no additional supplementation needed Dispo: PT/OT eval today, dispo pending. Plan to change dressing tomorrow  Follow - up plan: 2 weeks after d/c for repeat x-rays and wound check  Contact information:  Katha Hamming MD, Patrecia Pace PA-C. After hours and holidays please check [email or web address] for group call information for [facility]   Amber Colgate A. Ricci Barker, PA-C [phone] (office) [email or web address]

## 2020-11-06 NOTE — Progress Notes (Signed)
PROGRESS NOTE  Amber [name]:[id number] DOB: [birth year] DOA: 11/01/2020 PCP: Antony Contras, MD  Brief History   Amber [name] is a 86 y.o. female with history h/o hypertension, hyperlipidemia, CAD s/p CABG, chronic atrial fibrillation on Coumadin, CVA in 2018 with residual left-sided weakness, COPD (FEV1 66%) not on home O2, combined systolic/diastolic CHF for which she follows cardiology, dementia/depression presents from home after a fall.  Daughter, [name] who is an Therapist, sports at [employer], is at bedside and provides most of the history.  Patient apparently has longstanding left foot contractures and ambulating difficulties due to which she walks with left lower extremity brace and walker.  She apparently is quite functional and lives alone at home but daughter lives next door-[name] and her sister take turns to stay with patient at nights. Patient recently had heart failure exacerbation for which she refused to go to the hospital and was managed as outpatient with escalated Lasix dosages.  During this time after discussion with PCP and primary cardiology, daughter initiated hospice services for her mother.  Patient pleasantly confused at baseline but is quite communicative.  Patient apparently goes out for dinners with family quite frequently.  Earlier today, patient apparently was walking at home when she bent down to pick up something and lost her balance, per daughter likely twisted her ankle as she was found sitting on her buttocks. ED course: Afebrile, heart rate 47-123, blood pressure elevated at systolic 811B to 147W,?  Initially hypoxic (88% documented but according to daughter likely after pain medication) and placed on 3 L nasal cannula.  When I was in the room, patient was saturating 100% and continued to saturate around 95% without O2.  Work-up in the ED revealed INR 2.3, x-ray showed trimalleolar right ankle fractures which was reduced by EDP-subsequently evaluated by orthopedics and  recommended admission to [facility] for definitive management by Dr. Doreatha Martin next week.  Patient quiet cheerful during my evaluation and does not report pain unless asked about it.  She is pleasantly confused.  The patient was admitted to a telemetry bed. Cardiology was consulted to evaluate perioperative cardiovascular risk for this patient. She is at relatively high risk. However patient's daughter still wishes to go forward with the procedure as not fixing the patient would render the patient completely immobile and severely reduce her quality of life.  The patient underwent operative ORIF of the right lower extremity on 11/05/2020. She has tolerated the procedure well. She has participated with PT today. Family is stating that they prefer that she go to CIR. She is being evaluated for that.  Warfarin has been restarted.  Consultants  Orthopedic surgery Cardiology  Procedures  None  Antibiotics   Anti-infectives (From admission, onward)    Start     Dose/Rate Route Frequency Ordered Stop   11/05/20 1400  ceFAZolin (ANCEF) IVPB 2g/100 mL premix        2 g 200 mL/hr over 30 Minutes Intravenous Every 8 hours 11/05/20 1246 11/06/20 0622   11/05/20 1115  vancomycin (VANCOCIN) powder  Status:  Discontinued          As needed 11/05/20 1115 11/05/20 1132      Subjective  The patient is awake and alert this morning. She has required a great deal of help just with getting out of bed. The daughter states that they will not be able to handle that at home.  Objective   Vitals:  Vitals:   11/06/20 0800 11/06/20 1620  BP: [name])  131/91 (!) 136/43  Pulse: (!) 105 80  Resp:  14  Temp: 97.9 F (36.6 C) 97.9 F (36.6 C)  SpO2: 93% 98%    Exam:  Constitutional:  Patient is awake and alert. No acute distress. Respiratory:  CTA bilaterally, no w/r/r.  Respiratory effort normal. No retractions or accessory muscle use Cardiovascular:  RRR, no m/r/g No LE extremity edema   Normal pedal  pulses Abdomen:  Abdomen appears normal; no tenderness or masses No hernias No HSM Musculoskeletal:  Digits/nails BUE: no clubbing, cyanosis, petechiae, infection exam of joints, bones, muscles of at least one of following: head/neck, RUE, LUE, RLE, LLE   Right lower extremity is bandaged . Skin:  No rashes, lesions, ulcers palpation of skin: no induration or nodules Neurologic:  CN 2-12 intact Sensation all 4 extremities intact Psychiatric:  Patient appears to be in good spirits. Denies pain.  I have personally reviewed the following:   Today's Data  Vitals  Lab Data  BNP, CBC, INR  Imaging  CT cervical spine CT head without contrast X-ray right ankle 2 views  Cardiology Data  EKG Echocardiogram  Scheduled Meds:  acetaminophen  650 mg Oral Q6H   Or   acetaminophen  650 mg Rectal Q6H   allopurinol  300 mg Oral QHS   amLODipine  5 mg Oral QHS   aspirin  81 mg Oral Daily   citalopram  20 mg Oral QHS   diphenoxylate-atropine  1 tablet Oral Q M,W,F   docusate sodium  100 mg Oral BID   donepezil  5 mg Oral Daily   fentaNYL (SUBLIMAZE) injection  100 mcg Intravenous Once   furosemide  80 mg Oral Daily   levothyroxine  25 mcg Oral Q0600   losartan  25 mg Oral Daily   memantine  10 mg Oral BID   metoprolol succinate  25 mg Oral Daily   midazolam  4 mg Intravenous Once   potassium chloride SA  20 mEq Oral Daily   warfarin  7.5 mg Oral ONCE-1600   Warfarin - Pharmacist Dosing Inpatient   Does not apply q1600   Principal Problem:   Ankle fracture Active Problems:   Atrial fibrillation (HCC)   Chronic combined systolic and diastolic CHF (congestive heart failure) (HCC)   Dyslipidemia   Depression   Hypothyroidism   Coronary artery disease involving coronary bypass graft of native heart with angina pectoris (HCC)   COPD (chronic obstructive pulmonary disease) (HCC)   Hemiparesis and alteration of sensations as late effects of stroke (HCC)   Cerebrovascular  disease   Asthma-COPD overlap syndrome (HCC)   Dementia (HCC)   LOS: 5 days   A & P  1.Right trimalleolar ankle fracture: Reduced in the ED and now placed in a splint.  Seen by orthopedics, Coumadin held and plan for definitive management/ORIF probably next week.  Patient to be admitted to [facility] per orthopedics.  Supportive management with analgesics as needed.Cardiology was consulted to evaluate perioperative cardiovascular risk for this patient. She is at relatively high risk. However patient's daughter still wishes to go forward with the procedure as not fixing the patient would render the patient completely immobile and severely reduce her quality of life. Orthopedic surgery took the patient to the OR for ORIF of the right foot and ankle on 11/05/2020. She has tolerated the procedure well. Coumadin has been restarted.    2.  History of ischemic CVA in 2018 with residual left-sided weakness, noted to be on Coumadin as well  as?  Plavix as outpatient.  Holding both in anticipation of surgical intervention.  Okay to continue aspirin for now as discussed with cardiology/orthopedics.   3. Combined systolic/diastolic CHF: Appears overall compensated.  Patient initially diagnosed with diastolic CHF and most recent echo showed wall motion abnormalities as well as reduced EF at 35 to 40%.  Patient expressed wish for comfort/nonaggressive interventions and did not want to be hospitalized at which time daughter decided on hospice services for patient.  Currently patient does not have any leg swellings but abdomen noted to be somewhat distended-according to daughter she usually develops abdominal swelling of pulmonary edema due to CHF.  Can check BNP. Monitor volume satus.   4.  Asthma/COPD (FEV1 66%): Not on home O2.  Stable currently on room air.  Was transiently hypoxic after receiving sedation for reduction in the ED.  Pulmonary toilet in the perioperative setting, incentive spirometry ordered.   5.  CAD,  chronic atrial fibrillation, aortic stenosis: Discussed with cardiology who recommended to resume home medications.  Okay to hold Coumadin and discontinue Plavix, recommended aspirin for maintenance therapy as of now.  Patient does have operative risk given multiple comorbidities and advanced age but daughter understands and wishes for patient to undergo procedure for quality of life as she is quiet functional at baseline. Coumadin has been restarted today.   6.  Hypertension, hyperlipidemia, hypothyroidism: Resume home medications.   7.  Dementia/depression: Resume home medications.  Patient pleasantly confused at this time.  In a cheerful mood today.   8.  Goals of care: Patient currently receiving home hospice services from [facility].  She apparently has refused hospitalizations and aggressive care on multiple occasions in the past.  Daughter comfortable with hospice decision and did confirm DNR status.  She would like to revoke DNR status during surgery and resume hospice services upon discharge.  She would like patient to undergo ankle surgery for quality of life and pain relief.  I have seen and examined this patient myself. I have spent 34 minutes in her evaluation and care.   DVT prophylaxis: Therapeutic INR currently.  Cannot tolerate SCDs, ordered TED hose.   COVID screen: Negative   Code Status: DNR   Health care proxy would be her daughters   Patient/Family Communication: Discussed with patient 's daughter and all questions answered to satisfaction.  Consults called: Cardiology, orthopedics Admission status :IInpatient   Kirklin Mcduffee, DO [facility] Direct contact: see [email or web address]  7PM-7AM contact night coverage as above  11/06/2020, 5:19 PM  LOS: 1 day

## 2020-11-06 NOTE — Progress Notes (Signed)
Physical Therapy Treatment Patient Details Name: Amber [name] MRN: [id number] DOB: 08/29/1934 Today's Date: 11/06/2020   History of Present Illness 86 yo female presenting to ED on 9/2 with fall at home. Imaging showing R ankle fx. Awaiting ORIF, currently with closed reduction. PMH including cervical spinal stenosis, a-fib, COPD, dementia, CHF, CAD, depresssion, fibromyalgia, HTN, OA, and fall sustaining L rib fx (2001).    PT Comments    Patient received in recliner, pleasant but still groggy and borderline lethargic possibly due to meds. She requested to stay up in the recliner to eat after PT, so we focused on practicing multiple sit to stands with MaxAx2. Had heavy posterior lean in standing with limited ability to correct- tends to just fall backwards as soon as support is reduced and protective responses minimal. Left in recliner with all needs met, chair alarm active and daughter present. Per chart, CIR denied- so will need f/u in SNF setting moving forward.     Recommendations for follow up therapy are one component of a multi-disciplinary discharge planning process, led by the attending physician.  Recommendations may be updated based on patient status, additional functional criteria and insurance authorization.  Follow Up Recommendations  SNF     Equipment Recommendations  None recommended by PT    Recommendations for Other Services       Precautions / Restrictions Precautions Precautions: Fall Required Braces or Orthoses: Other Brace Splint/Cast: RLE splinted and wrapped in ace wrap Other Brace: boot RLE, AFO LLE Restrictions Weight Bearing Restrictions: Yes RLE Weight Bearing: Weight bearing as tolerated Other Position/Activity Restrictions: RLE WBAT in CAM boot     Mobility  Bed Mobility Overal bed mobility: Needs Assistance Bed Mobility: Supine to Sit     Supine to sit: Max assist;+2 for physical assistance     General bed mobility comments: up in  recliner    Transfers Overall transfer level: Needs assistance Equipment used: Rolling walker (2 wheeled) Transfers: Sit to/from Stand Sit to Stand: Max assist;+2 physical assistance Stand pivot transfers: Max assist;+2 physical assistance;+2 safety/equipment;From elevated surface       General transfer comment: practiced multiple times- needed heavy MaxAx2  to boost and had heavy posterior lean in standing with no protective reactions when support was reduced  Ambulation/Gait             General Gait Details: unable- poor balance, unable to maintain statically   Stairs             Wheelchair Mobility    Modified Rankin (Stroke Patients Only)       Balance Overall balance assessment: Needs assistance Sitting-balance support: No upper extremity supported;Feet supported Sitting balance-Leahy Scale: Poor Sitting balance - Comments: leans R and back Postural control: Right lateral lean Standing balance support: Bilateral upper extremity supported Standing balance-Leahy Scale: Poor Standing balance comment: Relies on RW and +2 physical assist, heavy posterior lean                            Cognition Arousal/Alertness: Awake/alert Behavior During Therapy: WFL for tasks assessed/performed Overall Cognitive Status: History of cognitive impairments - at baseline                                 General Comments: More lethargic this session due to pain m,eds from yesterday. Awakeing to name and agreeable to OOB        Exercises      General Comments General comments (skin integrity, edema, etc.): daughter present throughout and very anxious      Pertinent Vitals/Pain Pain Assessment: Faces Faces Pain Scale: Hurts little more Pain Location: RLE with touch Pain Descriptors / Indicators: Grimacing;Guarding;Moaning Pain Intervention(s): Limited activity within patient's tolerance;Monitored during session    Home Living                       Prior Function            PT Goals (current goals can now be found in the care plan section) Acute Rehab PT Goals Patient Stated Goal: Go home PT Goal Formulation: With patient/family Time For Goal Achievement: 11/17/20 Potential to Achieve Goals: Good Progress towards PT goals: Progressing toward goals    Frequency    Min 3X/week      PT Plan Discharge plan needs to be updated;Frequency needs to be updated    Co-evaluation              AM-PAC PT "6 Clicks" Mobility   Outcome Measure  Help needed turning from your back to your side while in a flat bed without using bedrails?: A Lot Help needed moving from lying on your back to sitting on the side of a flat bed without using bedrails?: Total Help needed moving to and from a bed to a chair (including a wheelchair)?: Total Help needed standing up from a chair using your arms (e.g., wheelchair or bedside chair)?: Total Help needed to walk in hospital room?: Total Help needed climbing 3-5 steps with a railing? : Total 6 Click Score: 7    End of Session Equipment Utilized During Treatment: Gait belt Activity Tolerance: Patient limited by fatigue Patient left: in chair;with call bell/phone within reach;with chair alarm set;with family/visitor present Nurse Communication: Mobility status PT Visit Diagnosis: Unsteadiness on feet (R26.81);Muscle weakness (generalized) (M62.81);Pain Pain - Right/Left: Right Pain - part of body: Leg;Ankle and joints of foot     Time: 1312-1330 PT Time Calculation (min) (ACUTE ONLY): 18 min  Charges:  $Therapeutic Activity: 8-22 mins                    Windell Norfolk, DPT, PN2   Supplemental Physical Therapist [place]    Pager [phone] Acute Rehab Office [phone]

## 2020-11-06 NOTE — Progress Notes (Signed)
Occupational Therapy Treatment Patient Details Name: Amber [name] MRN: [id number] DOB: [birth year] Today's Date: 11/06/2020   History of present illness 86 yo female presenting to ED on 9/2 with fall at home. Imaging showing R ankle fx. Awaiting ORIF, currently with closed reduction. PMH including cervical spinal stenosis, a-fib, COPD, dementia, CHF, CAD, depresssion, fibromyalgia, HTN, OA, and fall sustaining L rib fx (2001).   OT comments  Pt with progress towards goals this session. Pt performed stand pivot to recliner with Max A+2 with RW and recliner<> BSC with Max A +2. Pt limited by lethargy throughout session, possibly to due pain medication, impacting functional performance. Despite lethargy, pt agreeable and motivated to participate in therapy session. Continuing to recommend CIR to increase safety, address performance problems, and decrease burden of care. Will continue to follow in the acute setting.    Recommendations for follow up therapy are one component of a multi-disciplinary discharge planning process, led by the attending physician.  Recommendations may be updated based on patient status, additional functional criteria and insurance authorization.    Follow Up Recommendations  CIR    Equipment Recommendations  3 in 1 bedside commode;Wheelchair cushion (measurements OT);Wheelchair (measurements OT)    Recommendations for Other Services Rehab consult    Precautions / Restrictions Precautions Precautions: Fall Required Braces or Orthoses: Other Brace Other Brace: boot RLE, AFO LLE Restrictions Weight Bearing Restrictions: Yes RLE Weight Bearing: Weight bearing as tolerated       Mobility Bed Mobility Overal bed mobility: Needs Assistance Bed Mobility: Supine to Sit     Supine to sit: Max assist;+2 for physical assistance     General bed mobility comments: Required Max +2 a to support LEs to shift off bed and pushing up into sitting.    Transfers Overall  transfer level: Needs assistance Equipment used: Rolling walker (2 wheeled) Transfers: Sit to/from [facility] Sit to Stand: Max assist;+2 safety/equipment;+2 physical assistance Stand pivot transfers: Max assist;+2 physical assistance;+2 safety/equipment;From elevated surface       General transfer comment: Requiring physical assist to power up, pivot LEs, and pivot trunk to guide to sit in recliner.    Balance Overall balance assessment: Needs assistance Sitting-balance support: No upper extremity supported;Feet supported Sitting balance-Leahy Scale: Poor Sitting balance - Comments: leans R and back Postural control: Right lateral lean Standing balance support: Bilateral upper extremity supported Standing balance-Leahy Scale: Poor Standing balance comment: Relies on RW and +2 physical assist                           ADL either performed or assessed with clinical judgement   ADL Overall ADL's : Needs assistance/impaired     Grooming: Minimal assistance;Sitting Grooming Details (indicate cue type and reason): Pt lethargic, needing mod verbal/tactile cues and hand over hand to wash forehead.                 Toilet Transfer: Maximal assistance;+2 for safety/equipment;Stand-pivot;BSC Toilet Transfer Details (indicate cue type and reason): Pt required Max A +2, able to assist to power up and standing. Toileting- Clothing Manipulation and Hygiene: Maximal assistance;Sit to/from stand;+2 for physical assistance Toileting - Clothing Manipulation Details (indicate cue type and reason): Pt able to assist in standing while therapist performed hygiene     Functional mobility during ADLs: Maximal assistance;Rolling walker;+2 for physical assistance;+2 for safety/equipment General ADL Comments: Pt with increased lethargy and grogginess this session.     Vision  Perception     Praxis      Cognition Arousal/Alertness: Awake/alert Behavior  During Therapy: WFL for tasks assessed/performed Overall Cognitive Status: History of cognitive impairments - at baseline                                 General Comments: More lethargic this session due to pain m,eds from yesterday. Awakeing to name and agreeable to OOB        Exercises     Shoulder Instructions       General Comments Daughter present throughout    Pertinent Vitals/ Pain       Pain Assessment: Faces Faces Pain Scale: Hurts even more Pain Location: RLE with touch Pain Descriptors / Indicators: Grimacing;Guarding;Moaning Pain Intervention(s): Monitored during session;Ice applied  Home Living                                          Prior Functioning/Environment              Frequency  Min 2X/week        Progress Toward Goals  OT Goals(current goals can now be found in the care plan section)  Progress towards OT goals: Progressing toward goals  Acute Rehab OT Goals Patient Stated Goal: Go home OT Goal Formulation: With patient Time For Goal Achievement: 11/17/20 Potential to Achieve Goals: Good ADL Goals Pt Will Perform Upper Body Bathing: with supervision;sitting Pt Will Perform Lower Body Bathing: with min assist;sitting/lateral leans;sit to/from stand Pt Will Transfer to Toilet: with min assist;stand pivot transfer;bedside commode Pt Will Perform Toileting - Clothing Manipulation and hygiene: with min assist;sitting/lateral leans;sit to/from stand  Plan Discharge plan remains appropriate    Co-evaluation                 AM-PAC OT "6 Clicks" Daily Activity     Outcome Measure   Help from another person eating meals?: A Little Help from another person taking care of personal grooming?: A Little Help from another person toileting, which includes using toliet, bedpan, or urinal?: A Lot Help from another person bathing (including washing, rinsing, drying)?: A Lot Help from another person to put on  and taking off regular upper body clothing?: A Little Help from another person to put on and taking off regular lower body clothing?: A Lot 6 Click Score: 15    End of Session Equipment Utilized During Treatment: Gait belt  OT Visit Diagnosis: Unsteadiness on feet (R26.81);Other abnormalities of gait and mobility (R26.89);Muscle weakness (generalized) (M62.81);Pain Pain - Right/Left: Right Pain - part of body: Leg   Activity Tolerance Patient limited by lethargy   Patient Left in chair;with call bell/phone within reach;with chair alarm set;with family/visitor present   Nurse Communication Mobility status        Time: 1017-1106 OT Time Calculation (min): 49 min  Charges: OT General Charges $OT Visit: 1 Visit OT Treatments $Self Care/Home Management : 38-52 mins  Jackquline Denmark, OTS Acute Rehab Office: [phone]   Emanuelle Bastos 11/06/2020, 11:35 AM

## 2020-11-06 NOTE — Progress Notes (Addendum)
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 EMS was initiated by family with report of fall, noted right ankle deformity. Patient was initially transported to [facility] ED and then later to [facility]. She is admitted with a right trimalleolar ankle fracture. This is a related admission per Dr. Karie Georges with [facility]. Patient is DNR.    Checked in with Amber [name] is to have CIR consult to see if patient is eligible for inpatient rehab. [facility] Liaison visited patient with patient daughter Amber [name] at bedside. Patient up in recliner in NAD with eyes closed most of visit but opened eyes/spoke to verbal stimuli. Patient drsg to right foot intact and elevated  patient denied discomfort today. Patient daughter is weighing disposition options at this time - CIR, SNF with rehab or Home with hospice supplemented with Home Health.  Discussed how hospice could support patient with these possible options - daughter aware that hospice services could be reinstated after pursuing rehab path once treatment is completed. Supported daughter with concerns and encouraged to express feelings. Family has [facility] contact information if more support needed.  Liaison also spouse Amber [name] via telephone - feels comfortable with patient status and care at this time. Family aware that [facility] will continue to follow patient daily during this hospitalization.    Patient continues to be appropriate for general inpatient status due to the need to assess and monitor following orthopedic ankle surgery.   V/S: 97.9, 131/91, 105, 19, 93% RA I&O: 998.0/1975 (-976.7) Abnormal lab work: INR 1.4, RBC 3.65, Hemo 11.0, HCT 33.8, Na 134, Cal 8.4, Glucose 133 Diagnostics:  No new imaging IVs/PRNs: NS IVF at 16mls/hr, No prns adm 11/06/20   Problem list:  Weightbearing: _WBAT_ _RLE_ in CAM boot ROM: Ok for  ankle ROM as tolerated at rest Incisional and dressing care: _OK to remove dressings _9/28/22_ and leave open to air with dry gauze PRN_  Showering: OK to begin showering 11/09/20 if no drainage from incision Orthopedic device(s): _CAM boot_ RLE Pain management:  1. Tylenol 650 mg q 6 hours scheduled 2. Tramadol 50 mg mg q 6 hours PRN 3. Fentanyl 12.5-50 mcg q 2 hours PRN  VTE prophylaxis: _Coumadin_, SCDs ID:  Ancef 2gm post op Foley/Lines:  No foley, KVO IVFs Impediments to Fracture Healing: Vit D level 127, no additional supplementation needed Dispo: PT/OT eval today, dispo pending. Plan to change dressing tomorrow  Follow - up plan: _2 weeks_ after d/c for repeat x-rays and wound check    D/C planning- Ongoing assessment - Please use [facility] for all transportation needs for [facility] patients.  Goals of Care: Patient is a DNR. Ongoing discussions about rehab path verses home with hospice. Communication with IDT- [place] team updated on patient status Communication with PCG: [place] Liaison supported family via telephone and at bedside.   Please call with any hospice related questions/concerns,   Gar Ponto, RN [place] (in [place]) [phone]    Update: Patient not candidate for CIR at this time.

## 2020-11-06 NOTE — Progress Notes (Signed)
Inpatient Rehab Admissions Coordinator:   Consult received and chart reviewed.  Pt does not have the medical necessity to support an inpatient rehab admit at this time.  [employer] Medicare would not give approval for a diagnosis of ankle fracture.  Recommend f/u at a lower level of care (SNF versus HH).  Will sign off at this time.   Shann Medal, PT, DPT Admissions Coordinator [phone] 11/06/20  1:33 PM

## 2020-11-06 NOTE — TOC Initial Note (Addendum)
Transition of Care [facility]) - Initial/Assessment Note    Patient Details  Name: Amber [name] MRN: [id number] Date of Birth: Nov 18, 1934  Transition of Care [facility]) CM/SW Contact:    Marilu Favre, RN Phone Number: 11/06/2020, 11:14 AM  Clinical Narrative:                 Spoke to patient's daughter Lambert Keto at bedside. Patient asleep.   Discussed discharge plan. Patient from home with [facility] for hospice. Patient lives with her husband , however he is not physically able to help [place]. Lambert Keto ans her sister assist at home.   NCM spoke to [facility] earlier , they have a PT who could  visit a couple times  to work on safe transfers .   PT recommending CIR consult. Lambert Keto understands CIR admission coordinator would need to review clinicals etc and if they offered a bed insurance would need to approve. Kellie asked if patient would need to resend her hospice benefit for rehab. NCM will deferred to [place] would like patient more mobile before returning home.   Will update Sherry with [facility]  Secure chatted PA for inpt rehab order.    Expected Discharge Plan: [place]     Patient Goals and CMS Choice     Choice offered to / list presented to : Adult Children, Patient  Expected Discharge Plan and Services Expected Discharge Plan: [place]       Living arrangements for the past 2 months: Single Family Home                                      Prior Living Arrangements/Services Living arrangements for the past 2 months: Single Family Home Lives with:: Spouse Patient language and need for interpreter reviewed:: Yes        Need for Family Participation in Patient Care: Yes (Comment) Care giver support system in place?: Yes (comment)   Criminal Activity/Legal Involvement Pertinent to Current Situation/Hospitalization: No - Comment as needed  Activities of Daily Living Home Assistive Devices/Equipment: Walker (specify  type), Brace (specify type) (4 wheeled walker, left lower leg brace) ADL Screening (condition at time of admission) Patient's cognitive ability adequate to safely complete daily activities?: Yes Is the patient deaf or have difficulty hearing?: No Does the patient have difficulty seeing, even when wearing glasses/contacts?: No Does the patient have difficulty concentrating, remembering, or making decisions?: Yes Patient able to express need for assistance with ADLs?: Yes Does the patient have difficulty dressing or bathing?: Yes Independently performs ADLs?: No (secondary to right ankle surgery) Communication: Independent Dressing (OT): Needs assistance Is this a change from baseline?: Change from baseline, expected to last >3 days Grooming: Needs assistance Is this a change from baseline?: Change from baseline, expected to last >3 days Feeding: Needs assistance Is this a change from baseline?: Change from baseline, expected to last >3 days Bathing: Needs assistance Is this a change from baseline?: Change from baseline, expected to last >3 days Toileting: Dependent Is this a change from baseline?: Change from baseline, expected to last >3days In/Out Bed: Dependent Is this a change from baseline?: Change from baseline, expected to last >3 days Walks in Home: Dependent Is this a change from baseline?: Change from baseline, expected to last >3 days Does the patient have difficulty walking or climbing stairs?: Yes (secondary to right ankle injury) Weakness of Legs: Right  Weakness of Arms/Hands: None  Permission Sought/Granted                  Emotional Assessment         Alcohol / Substance Use: Not Applicable Psych Involvement: No (comment)  Admission diagnosis:  Ankle fracture [S82.899A] Fall, initial encounter [W19.XXXA] Closed fracture dislocation of right ankle, initial encounter [S82.891A] Patient Active Problem List   Diagnosis Date Noted   Ankle fracture 11/01/2020    Rhinovirus infection 08/04/2019   Aspiration pneumonitis ([place]) 08/03/2019   Acute respiratory failure with hypoxia ([place]) 08/03/2019   Hypoxia 07/23/2019   Sprain of ankle 05/18/2019   Foot callus 05/18/2019   Fall (on) (from) other stairs and steps, initial encounter 04/28/2019   Left rib fracture 04/28/2019   Compression fracture of body of thoracic vertebra (HCC) 04/28/2019   Acute pulmonary edema (HCC)    Palliative care encounter    Acute on chronic diastolic CHF (congestive heart failure) ([place]) 03/04/2017   Asthma-COPD overlap syndrome ([place]) 02/06/2017   Acute on chronic respiratory failure with hypoxia ([place]) 02/06/2017   Dementia ([place]) 02/06/2017   Encounter for therapeutic drug monitoring 10/20/2016   Cerebrovascular disease 05/21/2016   Hemiparesis and alteration of sensations as late effects of stroke ([place]) 01/17/2015   Memory change 01/23/2014   Steroid-induced psychosis 02/14/2013   Long term current use of anticoagulant therapy    Streptococcal pneumonia ([place]) 02/12/2013   Vertebral artery stenosis 12/18/2011   COPD (chronic obstructive pulmonary disease) ([place]) 06/04/2010   Fibromyalgia 05/10/2010   Depression 05/10/2010   Hypothyroidism 05/10/2010   Osteoporosis 05/10/2010   Coronary artery disease involving coronary bypass graft of native heart with angina pectoris ([place]) 05/10/2010   Chronic combined systolic and diastolic CHF (congestive heart failure) ([place])    Dyslipidemia    Atrial fibrillation ([place]) 05/06/2010   PCP:  Antony Contras, MD Pharmacy:   [id number] - [place], [place] - [address] [place] [address] Phone: [phone] Fax: [phone]     Social Determinants of Health (SDOH) Interventions    Readmission Risk Interventions No flowsheet data found.

## 2020-11-06 NOTE — Progress Notes (Signed)
PROGRESS NOTE  [name] MCN:[id number] DOB: 1934-05-03 DOA: 11/01/2020 PCP: Antony Contras, MD  Brief History   Amber [name] is a 86 y.o. female with history h/o hypertension, hyperlipidemia, CAD s/p CABG, chronic atrial fibrillation on Coumadin, CVA in 2018 with residual left-sided weakness, COPD (FEV1 66%) not on home O2, combined systolic/diastolic CHF for which she follows cardiology, dementia/depression presents from home after a fall.  Daughter, [name] who is an Therapist, sports at [employer], is at bedside and provides most of the history.  Patient apparently has longstanding left foot contractures and ambulating difficulties due to which she walks with left lower extremity brace and walker.  She apparently is quite functional and lives alone at home but daughter lives next door-[name] and her sister take turns to stay with patient at nights. Patient recently had heart failure exacerbation for which she refused to go to the hospital and was managed as outpatient with escalated Lasix dosages.  During this time after discussion with PCP and primary cardiology, daughter initiated hospice services for her mother.  Patient pleasantly confused at baseline but is quite communicative.  Patient apparently goes out for dinners with family quite frequently.  Earlier today, patient apparently was walking at home when she bent down to pick up something and lost her balance, per daughter likely twisted her ankle as she was found sitting on her buttocks. ED course: Afebrile, heart rate 47-123, blood pressure elevated at systolic 629U to 765Y,?  Initially hypoxic (88% documented but according to daughter likely after pain medication) and placed on 3 L nasal cannula.  When I was in the room, patient was saturating 100% and continued to saturate around 95% without O2.  Work-up in the ED revealed INR 2.3, x-ray showed trimalleolar right ankle fractures which was reduced by EDP-subsequently evaluated by orthopedics and  recommended admission to [facility] for definitive management by Dr. Doreatha Martin next week.  Patient quiet cheerful during my evaluation and does not report pain unless asked about it.  She is pleasantly confused.  The patient was admitted to a telemetry bed. Cardiology was consulted to evaluate perioperative cardiovascular risk for this patient. She is at relatively high risk. However patient's daughter still wishes to go forward with the procedure as not fixing the patient would render the patient completely immobile and severely reduce her quality of life.  The patient underwent operative ORIF of the right lower extremity on 11/05/2020.   Consultants  Orthopedic surgery Cardiology  Procedures  None  Antibiotics   Anti-infectives (From admission, onward)    Start     Dose/Rate Route Frequency Ordered Stop   11/05/20 1400  ceFAZolin (ANCEF) IVPB 2g/100 mL premix        2 g 200 mL/hr over 30 Minutes Intravenous Every 8 hours 11/05/20 1246 11/06/20 0622   11/05/20 1115  vancomycin (VANCOCIN) powder  Status:  Discontinued          As needed 11/05/20 1115 11/05/20 1132      Subjective  The patient is somnolent having returned from surgery. No new complaints.   Objective   Vitals:  Vitals:   11/06/20 0800 11/06/20 1620  BP: (!) 131/91 (!) 136/43  Pulse: (!) 105 80  Resp:  14  Temp: 97.9 F (36.6 C) 97.9 F (36.6 C)  SpO2: 93% 98%    Exam:  Constitutional:  Patient is somnolent. No acute distress. Respiratory:  CTA bilaterally, no w/r/r.  Respiratory effort normal. No retractions or accessory muscle use Cardiovascular:  RRR, no m/r/g No LE extremity edema   Normal pedal pulses Abdomen:  Abdomen appears normal; no tenderness or masses No hernias No HSM Musculoskeletal:  Digits/nails BUE: no clubbing, cyanosis, petechiae, infection exam of joints, bones, muscles of at least one of following: head/neck, RUE, LUE, RLE, LLE   Right lower extremity is bandaged and  splinted. Skin:  No rashes, lesions, ulcers palpation of skin: no induration or nodules Neurologic:  CN 2-12 intact Sensation all 4 extremities intact Psychiatric:  Patient appears to be in good spirits. Denies pain.  I have personally reviewed the following:   Today's Data  Vitals  Lab Data  BNP, CBC, INR  Imaging  CT cervical spine CT head without contrast X-ray right ankle 2 views  Cardiology Data  EKG Echocardiogram  Scheduled Meds:  acetaminophen  650 mg Oral Q6H   Or   acetaminophen  650 mg Rectal Q6H   allopurinol  300 mg Oral QHS   amLODipine  5 mg Oral QHS   aspirin  81 mg Oral Daily   citalopram  20 mg Oral QHS   diphenoxylate-atropine  1 tablet Oral Q M,W,F   docusate sodium  100 mg Oral BID   donepezil  5 mg Oral Daily   fentaNYL (SUBLIMAZE) injection  100 mcg Intravenous Once   furosemide  80 mg Oral Daily   levothyroxine  25 mcg Oral Q0600   losartan  25 mg Oral Daily   memantine  10 mg Oral BID   metoprolol succinate  25 mg Oral Daily   midazolam  4 mg Intravenous Once   potassium chloride SA  20 mEq Oral Daily   warfarin  7.5 mg Oral ONCE-1600   Warfarin - Pharmacist Dosing Inpatient   Does not apply q1600   Principal Problem:   Ankle fracture Active Problems:   Atrial fibrillation (HCC)   Chronic combined systolic and diastolic CHF (congestive heart failure) (HCC)   Dyslipidemia   Depression   Hypothyroidism   Coronary artery disease involving coronary bypass graft of native heart with angina pectoris (HCC)   COPD (chronic obstructive pulmonary disease) (HCC)   Hemiparesis and alteration of sensations as late effects of stroke (HCC)   Cerebrovascular disease   Asthma-COPD overlap syndrome (HCC)   Dementia (HCC)   LOS: 5 days   A & P  1.Right trimalleolar ankle fracture: Reduced in the ED and now placed in a splint.  Seen by orthopedics, Coumadin held and plan for definitive management/ORIF probably next week.  Patient to be admitted  to [facility] per orthopedics.  Supportive management with analgesics as needed.Cardiology was consulted to evaluate perioperative cardiovascular risk for this patient. She is at relatively high risk. However patient's daughter still wishes to go forward with the procedure as not fixing the patient would render the patient completely immobile and severely reduce her quality of life. Orthopedic surgery took the patient to the OR for ORIF of the right foot and ankle on 11/05/2020. She has tolerated the procedure well. Will restart coumadin when okay with surgery.   2.  History of ischemic CVA in 2018 with residual left-sided weakness, noted to be on Coumadin as well as?  Plavix as outpatient.  Holding both in anticipation of surgical intervention.  Okay to continue aspirin for now as discussed with cardiology/orthopedics.   3. Combined systolic/diastolic CHF: Appears overall compensated.  Patient initially diagnosed with diastolic CHF and most recent echo showed wall motion abnormalities as well as reduced EF at 35 to  40%.  Patient expressed wish for comfort/nonaggressive interventions and did not want to be hospitalized at which time daughter decided on hospice services for patient.  Currently patient does not have any leg swellings but abdomen noted to be somewhat distended-according to daughter she usually develops abdominal swelling of pulmonary edema due to CHF.  Can check BNP. Monitor volume satus.   4.  Asthma/COPD (FEV1 66%): Not on home O2.  Stable currently on room air.  Was transiently hypoxic after receiving sedation for reduction in the ED.  Pulmonary toilet in the perioperative setting, incentive spirometry ordered.   5.  CAD, chronic atrial fibrillation, aortic stenosis: Discussed with cardiology who recommended to resume home medications.  Okay to hold Coumadin and discontinue Plavix, recommended aspirin for maintenance therapy as of now.  Patient does have operative risk given multiple comorbidities  and advanced age but daughter understands and wishes for patient to undergo procedure for quality of life as she is quiet functional at baseline   6.  Hypertension, hyperlipidemia, hypothyroidism: Resume home medications.   7.  Dementia/depression: Resume home medications.  Patient pleasantly confused at this time.  In a cheerful mood today.   8.  Goals of care: Patient currently receiving home hosp[facility] services from [facility].  She apparently has refused hospitalizations and aggressive care on multiple occasions in the past.  Daughter comfortable with hospice decision and did confirm DNR status.  She would like to revoke DNR status during surgery and resume hospice services upon discharge.  She would like patient to undergo ankle surgery for quality of life and pain relief.   DVT prophylaxis: Therapeutic INR currently.  Cannot tolerate SCDs, ordered TED hose.   COVID screen: Negative   Code Status: DNR   Health care proxy would be her daughters   Patient/Family Communication: Discussed with patient 's daughter and all questions answered to satisfaction.  Consults called: Cardiology, orthopedics Admission status :IInpatient   Tri Chittick, DO [facility] Direct contact: see [email or web address]  7PM-7AM contact night coverage as above  11/06/2020, 3:21 PM  LOS: 1 day

## 2020-11-07 DIAGNOSIS — S82891A Other fracture of right lower leg, initial encounter for closed fracture: Secondary | ICD-10-CM | POA: Diagnosis not present

## 2020-11-07 LAB — PROTIME-INR
INR: 1.7 — ABNORMAL HIGH (ref 0.8–1.2)
Prothrombin Time: 19.7 seconds — ABNORMAL HIGH (ref 11.4–15.2)

## 2020-11-07 LAB — BASIC METABOLIC PANEL
Anion gap: 6 (ref 5–15)
BUN: 21 mg/dL (ref 8–23)
CO2: 28 mmol/L (ref 22–32)
Calcium: 8.6 mg/dL — ABNORMAL LOW (ref 8.9–10.3)
Chloride: 101 mmol/L (ref 98–111)
Creatinine, Ser: 0.96 mg/dL (ref 0.44–1.00)
GFR, Estimated: 58 mL/min — ABNORMAL LOW (ref >60)
Glucose, Bld: 118 mg/dL — ABNORMAL HIGH (ref 70–99)
Potassium: 4.4 mmol/L (ref 3.5–5.1)
Sodium: 135 mmol/L (ref 135–145)

## 2020-11-07 LAB — CBC
HCT: 33.2 % — ABNORMAL LOW (ref 36.0–46.0)
Hemoglobin: 10.8 g/dL — ABNORMAL LOW (ref 12.0–15.0)
MCH: 30.1 pg (ref 26.0–34.0)
MCHC: 32.5 g/dL (ref 30.0–36.0)
MCV: 92.5 fL (ref 80.0–100.0)
Platelets: 196 10*3/uL (ref 150–400)
RBC: 3.59 MIL/uL — ABNORMAL LOW (ref 3.87–5.11)
RDW: 14.9 % (ref 11.5–15.5)
WBC: 7 10*3/uL (ref 4.0–10.5)
nRBC: 0 % (ref 0.0–0.2)

## 2020-11-07 MED ORDER — WARFARIN SODIUM 5 MG PO TABS
5.0000 mg | ORAL_TABLET | Freq: Once | ORAL | Status: AC
  Administered 2020-11-07: 5 mg via ORAL
  Filled 2020-11-07: qty 1

## 2020-11-07 MED ORDER — TRAMADOL HCL 50 MG PO TABS
50.0000 mg | ORAL_TABLET | Freq: Three times a day (TID) | ORAL | Status: DC | PRN
Start: 2020-11-07 — End: 2020-11-08
  Administered 2020-11-07: 50 mg via ORAL
  Filled 2020-11-07: qty 1

## 2020-11-07 NOTE — Progress Notes (Signed)
M[facility] 6n 14 - Manufacturing engineer [facility]) Hospitalized Hospice Patient:   Amber [name] is a current hospice patient with [facility] with a terminal diagnosis of CKD stage 3 with chronic combined CHF. EMS was initiated by family with report of fall, noted right ankle deformity. Patient was initially transported to [facility] ED and then later to [facility]. She is admitted with a right trimalleolar ankle fracture. This is a related admission per Dr. Karie Georges with [facility]. Patient is DNR.   Visited at bedside with daughter, Amber [name] present. Patient is up in recliner with boot on operative ankle and brace on left ankle. She is alert and oriented, in no apparent distress. Daughter reports it took 2 people to assist patient from bed to chair. She was able to shortly bear weight on operative leg but unable to ambulate. Discussed going home with additional caregivers vs rehab with daughter. She is waiting for sister to arrive and assist with decision.   Patient continues to be appropriate for general inpatient status due to the need to assess and monitor following orthopedic ankle surgery. Seeking rehab placement currently.   VS: 97.5, 137/61, 71, 18, 97% I/O: -/400 Abn Labs:  11/07/2020 01:53 Glucose: 118 (H) Calcium: 8.6 (L) GFR, Estimated: 58 (L) RBC: 3.59 (L) Hemoglobin: 10.8 (L) HCT: 33.2 (L) Prothrombin Time: 19.7 (H) INR: 1.7 (H) Diagnostics:  No new imaging IVs/PRNs: No Continuous medications, no PRNs, scheduled Tylenol for pain.   Problem list: Right trimalleolar fracture status post surgery 11/05/2020 Dr. Doreatha Martin - Pain control, weightbearing status etc. as per orthopedics (Tylenol first choice tramadol 50 every 6 as needed second choice) - Patient family reluctant to take her to [place] asked social work reach out to patient and family with regards to other options   Ischemic CVA 2018, residual left-sided weakness - Continue aspirin, Coumadin resumed per pharmacy -Plavix to be resumed in the  outpatient setting if felt to have low risk of bleeding-I have held it at this time   Combined systolic diastolic heart failure EF 35-40% - continue Lasix 80 daily Cozaar 25 daily Toprol-XL 25, volume status seems net negative -2 L   Elevated CBGs without diagnosis diabetes - Only monitoring at this time   HTN - Continue amlodipine 5  Multi-infarct dementia -On hospice for the same - Continue Aricept 5, Namenda 10 twice daily Celexa 20 twice daily  Asthma/COPD overlap - Continue DuoNeb inhaler   Hypothyroid - Continue Synthroid 25 mcg daily-labs in the outpatient setting  D/C planning- Ongoing assessment - Please use [facility] for all transportation needs for [facility] patients.   Goals of Care: Patient is a DNR. Ongoing discussions about rehab path verses home with hospice. Communication with IDT- [place] team updated on patient status Communication with PCG: Spoke with daughter at bedside.   Please call with any hospice related questions/concerns.  Farrel Gordon, RN, [facility] Liaison   [phone]

## 2020-11-07 NOTE — TOC Progression Note (Addendum)
Transition of Care [facility]) - Progression Note    Patient Details  Name: Amber [name] MRN: [id number] Date of Birth: 02-20-1934  Transition of Care [facility]) CM/SW Contact  Jacalyn Lefevre Edson Snowball, RN Phone Number: 11/07/2020, 11:08 AM  Clinical Narrative:     Damaris Schooner to patient and daughter Amber [name] at bedside.   Patient from home with her husband and daughters assist. Daughters are unable to provide 24 hour supervision/ assistance and patient's husband unable to provide physical assistance.   Amber [name] hoping her mother becomes more mobile prior to discharge home.   Patient has had home health in the past and aware HHPT only visits a couple times a week. Amber [name] also familiar with home hospice care.  Discussed possible SNF placement. Kellie unsure at this time and wants to discuss options with her sister. NCM asked if NCM could complete FL2 and fax to SNF's so family would know their options if they decided on SNF. Of course insurance would have to approve SNF stay for rehab. Kellie consented. NCM faxed FL2 will provide SNF options once received.   Family will need to resend hospice benefit if they decide on rehab. This will need to be done prior to submitting for insurance authorization    1330 Provided patient and Amber [name] with SNF bed offers. Per Nelda Marseille is on her way to visit and they will discuss disosition. Audrea Muscat with [facility] updated.  Expected Discharge Plan: [place]    Expected Discharge Plan and Services Expected Discharge Plan: [place]   Discharge Planning Services: CM Consult   Living arrangements for the past 2 months: Single Family Home                   DME Agency: NA       HH Arranged: NA           Social Determinants of Health (SDOH) Interventions    Readmission Risk Interventions No flowsheet data found.

## 2020-11-07 NOTE — Progress Notes (Signed)
ANTICOAGULATION CONSULT NOTE - Follow Up Consult  Pharmacy Consult for Warfarin Indication: atrial fibrillation  Allergies  Allergen Reactions   Ezetimibe Other (See Comments)    Myalgia   Welchol [Colesevelam Hcl] Other (See Comments)    Muscle aches   Adhesive [Tape] Itching, Rash and Other (See Comments)    Burning    Ceclor [Cefaclor] Other (See Comments)    Unknown allergic reaction   Elastic Bandages & [Zinc] Rash and Other (See Comments)    Turns red on the areas it touches   Latex Itching, Rash and Other (See Comments)    Burning    Lipitor [Atorvastatin Calcium] Other (See Comments)    Increased fibromyalgia pain   Mevacor [Lovastatin] Other (See Comments)    Increased fibromyalgia pain   Pravachol Other (See Comments)    Increased fibromyalgia pain   Vasotec Other (See Comments)    Unknown allergic reaction    Patient Measurements: Height: 5\' 5"  (165.1 cm) Weight: 64 kg (141 lb 1.5 oz) IBW/kg (Calculated) : 57   Vital Signs: Temp: 97.7 F (36.5 C) (09/28 0343) Temp Source: Oral (09/28 0343) BP: 118/45 (09/28 0343) Pulse Rate: 67 (09/28 0343)  Labs: Recent Labs    11/05/20 0849 11/06/20 0333 11/06/20 0454 11/07/20 0153  HGB  --  11.0*  --  10.8*  HCT  --  33.8*  --  33.2*  PLT  --  200  --  196  LABPROT 16.5*  --  17.2* 19.7*  INR 1.3*  --  1.4* 1.7*  CREATININE  --  0.83  --  0.96     Estimated Creatinine Clearance: 39.3 mL/min (by C-G formula based on SCr of 0.96 mg/dL).   Medical History: Past Medical History:  Diagnosis Date   Abnormality of gait 08/02/2014   Cerebrovascular disease 05/21/2016   Cervical spinal stenosis    Chronic atrial fibrillation (HCC)    a. chronic/rate controlled;  b. chronic coumadin.   Chronic combined systolic and diastolic CHF (congestive heart failure) (HCC)    CKD (chronic kidney disease), stage III (HCC)    COPD with emphysema (HCC)    PFT 05/02/10>>FEV1 1.35(62%), FEV1% 66, DLCO 75%   Coronary artery  disease    a. s/p CABG;  b. Abnormal nuc 2015 - managed medically.   CVA (cerebral vascular accident) ([place])    left sided weakness   Depression    Fibromyalgia    GERD (gastroesophageal reflux disease)    pepcid 2-3 times per week   Gout    Hemiparesis and alteration of sensations as late effects of stroke ([place]) 01/17/2015   History of melanoma    squamous cell, melanoma   Hyperlipidemia    a. statin intolerant, not felt to be candidate for PCSK9 due to chronically elevated CK levels.   Hypertension    Hypertensive heart disease    Hypothyroidism    Insomnia    Memory change 01/23/2014   Nasal polyposis    Osteoarthritis    Osteoporosis    Pneumonia    1990   Tobacco abuse     Assessment: 86 yo female, s/p fall and ORIF of ankle fracture. On Coumadin PTA for afib. Home dose 5mg  daily except 7.5 mg on Mondays. Pharmacy consulted to restart Coumadin without bridging therapy on post-op day 0.   INR today is subtherapeutic but up to 1.7. H/H mildly low. Plt wnl  Goal of Therapy:  INR 2-3 Monitor platelets by anticoagulation protocol: Yes   Plan:  Warfarin 5 mg po x 1 tonight F/u daily INR   [employer], Pharm.D., BCPS Clinical Pharmacist Clinical phone for 11/07/2020 from 7:30-3:00 is [phone].  **Pharmacist phone directory can be found on [email or web address] listed under [place].  11/07/2020 8:03 AM

## 2020-11-07 NOTE — Hospital Course (Signed)
86 year old white female HTN HLD CABG 07/1998, TIA/CVA with left-sided hemiparesis 2018, HFpEF COPD not on home oxygen Chronic atrial fibrillation-Coumadin held on admit Longstanding left foot contractures Prior falls and multiple fractures 04/2019 Now on hospice secondary to advanced heart failure Sustained a fall 11/01/2020 X-ray showed trimalleolar right ankle fracture was reduced by ED

## 2020-11-07 NOTE — NC FL2 (Signed)
[place] MEDICAID FL2 LEVEL OF CARE SCREENING TOOL     IDENTIFICATION  Patient Name: Amber [name] Birthdate: 1934/06/29 Sex: female Admission Date (Current Location): 11/01/2020  [facility] and [place] Number:  Herbalist and Address:  [facility], [place] [address], [place], [place] [address]      Provider Number: [id number]  Attending Physician Name and Address:  Nita Sells, MD  Relative Name and Phone Number:  daughter Thressa Sheller [phone]    Current Level of Care: Hospital Recommended Level of Care: [place] Prior Approval Number:    Date Approved/Denied:   PASRR Number: [id number]  Discharge Plan: SNF    Current Diagnoses: Patient Active Problem List   Diagnosis Date Noted   Ankle fracture 11/01/2020   Rhinovirus infection 08/04/2019   Aspiration pneumonitis ([place]) 08/03/2019   Acute respiratory failure with hypoxia ([place]) 08/03/2019   Hypoxia 07/23/2019   Sprain of ankle 05/18/2019   Foot callus 05/18/2019   Fall (on) (from) other stairs and steps, initial encounter 04/28/2019   Left rib fracture 04/28/2019   Compression fracture of body of thoracic vertebra ([place]) 04/28/2019   Acute pulmonary edema ([place])    Palliative care encounter    Acute on chronic diastolic CHF (congestive heart failure) ([place]) 03/04/2017   Asthma-COPD overlap syndrome ([place]) 02/06/2017   Acute on chronic respiratory failure with hypoxia ([place]) 02/06/2017   Dementia ([place]) 02/06/2017   Encounter for therapeutic drug monitoring 10/20/2016   Cerebrovascular disease 05/21/2016   Hemiparesis and alteration of sensations as late effects of stroke ([place]) 01/17/2015   Memory change 01/23/2014   Steroid-induced psychosis 02/14/2013   Long term current use of anticoagulant therapy    Streptococcal pneumonia ([place]) 02/12/2013   Vertebral artery stenosis 12/18/2011   COPD (chronic obstructive pulmonary disease) ([place]) 06/04/2010    Fibromyalgia 05/10/2010   Depression 05/10/2010   Hypothyroidism 05/10/2010   Osteoporosis 05/10/2010   Coronary artery disease involving coronary bypass graft of native heart with angina pectoris ([place]) 05/10/2010   Chronic combined systolic and diastolic CHF (congestive heart failure) ([place])    Dyslipidemia    Atrial fibrillation ([place]) 05/06/2010    Orientation RESPIRATION BLADDER Height & Weight     Self  Normal Continent Weight: 64 kg Height:  5\' 5"  (165.1 cm)  BEHAVIORAL SYMPTOMS/MOOD NEUROLOGICAL BOWEL NUTRITION STATUS      Continent Diet  AMBULATORY STATUS COMMUNICATION OF NEEDS Skin   Extensive Assist Verbally  (right ankle compression wrap , 11/05/20 ORIF rt trimalleolar ankle fx and rt syndesmosis)                       Personal Care Assistance Level of Assistance  Bathing, Dressing, Feeding Bathing Assistance: Maximum assistance Feeding assistance: Limited assistance Dressing Assistance: Maximum assistance     Functional Limitations Info  Hearing, Sight Sight Info: Adequate Hearing Info: Adequate      SPECIAL CARE FACTORS FREQUENCY  PT (By licensed PT), OT (By licensed OT)     PT Frequency: five times a week OT Frequency: five times a week            Contractures Contractures Info: Not present    Additional Factors Info  Code Status, Allergies Code Status Info: DNR Allergies Info: adhesive tape, ceclor, elastic bandages and zinc, latex, vasotec Ezetimibe, welchol, lipitor, lovastatin , prvachol           Current Medications (11/07/2020):  This is the current  hospital active medication list Current Facility-Administered Medications  Medication Dose Route Frequency Provider Last Rate Last Admin   acetaminophen (TYLENOL) tablet 650 mg  650 mg Oral Q6H Delray Alt, PA-C   650 mg at 11/07/20 4081   Or   acetaminophen (TYLENOL) suppository 650 mg  650 mg Rectal Q6H Delray Alt, PA-C       allopurinol (ZYLOPRIM) tablet 300 mg  300 mg Oral QHS  Delray Alt, PA-C   300 mg at 11/06/20 2215   amLODipine (NORVASC) tablet 5 mg  5 mg Oral QHS Delray Alt, PA-C   5 mg at 11/06/20 2215   aspirin chewable tablet 81 mg  81 mg Oral Daily Delray Alt, PA-C   81 mg at 11/07/20 0940   bisacodyl (DULCOLAX) EC tablet 5 mg  5 mg Oral Daily PRN Delray Alt, PA-C   5 mg at 11/04/20 2049   citalopram (CELEXA) tablet 20 mg  20 mg Oral QHS Delray Alt, PA-C   20 mg at 11/06/20 2215   diphenoxylate-atropine (LOMOTIL) 2.5-0.025 MG per tablet 1 tablet  1 tablet Oral Q M,W,F Delray Alt, PA-C   1 tablet at 11/02/20 4481   docusate sodium (COLACE) capsule 100 mg  100 mg Oral BID Delray Alt, PA-C   100 mg at 11/07/20 0940   donepezil (ARICEPT) tablet 5 mg  5 mg Oral Daily Delray Alt, PA-C   5 mg at 11/07/20 0940   furosemide (LASIX) tablet 80 mg  80 mg Oral Daily Delray Alt, PA-C   80 mg at 11/07/20 8563   hydrALAZINE (APRESOLINE) injection 10 mg  10 mg Intravenous Q6H PRN Delray Alt, PA-C       ipratropium-albuterol (DUONEB) 0.5-2.5 (3) MG/3ML nebulizer solution 3 mL  3 mL Nebulization Q4H PRN Delray Alt, PA-C       levothyroxine (SYNTHROID) tablet 25 mcg  25 mcg Oral Q0600 Delray Alt, PA-C   25 mcg at 11/07/20 1497   losartan (COZAAR) tablet 25 mg  25 mg Oral Daily Delray Alt, PA-C   25 mg at 11/07/20 0940   memantine (NAMENDA) tablet 10 mg  10 mg Oral BID Delray Alt, PA-C   10 mg at 11/07/20 0263   metoprolol succinate (TOPROL-XL) 24 hr tablet 25 mg  25 mg Oral Daily Delray Alt, PA-C   25 mg at 11/07/20 7858   midazolam (VERSED) injection 4 mg  4 mg Intravenous Once Yacobi, Sarah A, PA-C       nitroGLYCERIN (NITROSTAT) SL tablet 0.4 mg  0.4 mg Sublingual Q5 min PRN Delray Alt, PA-C       ondansetron [facility]) tablet 4 mg  4 mg Oral Q6H PRN Delray Alt, PA-C       Or   ondansetron (ZOFRAN) injection 4 mg  4 mg Intravenous Q6H PRN Patrecia Pace A, PA-C       polyethylene glycol (MIRALAX  / GLYCOLAX) packet 17 g  17 g Oral Daily PRN Patrecia Pace A, PA-C       potassium chloride SA (KLOR-CON) CR tablet 20 mEq  20 mEq Oral Daily Patrecia Pace A, PA-C   20 mEq at 11/07/20 0940   traMADol (ULTRAM) tablet 50 mg  50 mg Oral Q8H PRN Nita Sells, MD       warfarin (COUMADIN) tablet 5 mg  5 mg Oral ONCE-1600 Hammons, Theone Murdoch, [facility]  Warfarin - Pharmacist Dosing Inpatient   Does not apply q1600 Swayze, Ava, DO   Given at 11/06/20 1750     Discharge Medications: Please see discharge summary for a list of discharge medications.  Relevant Imaging Results:  Relevant Lab Results:   Additional Information CVA in 2018 with residual left-sided weakness,SS:  [phone], s/p 9/26 ORIF rt Trimalleolar ankle fx and rt syndesmosis  Laveda Demedeiros, Edson Snowball, RN

## 2020-11-07 NOTE — Progress Notes (Signed)
Orthopedic Tech Progress Note Patient Details:  Amber [name] Aug 03, 1934 [id number]  Applied SHORT CAM WALKER to patient ankle with daughter at bedside   Ortho Devices Type of Ortho Device: CAM walker Ortho Device/Splint Location: RLE Ortho Device/Splint Interventions: Ordered, Application   Post Interventions Patient Tolerated: Well Instructions Provided: [facility] of [place] 11/07/2020, 4:37 PM

## 2020-11-07 NOTE — Progress Notes (Signed)
PROGRESS NOTE   Amber [name]  ZOX:[id number] DOB: 1934-05-09 DOA: 11/01/2020 PCP: Antony Contras, MD  Brief Narrative:  86 year old white female HTN HLD CABG 07/1998, TIA/CVA with left-sided hemiparesis 2018, HFpEF COPD not on home oxygen Chronic atrial fibrillation-Coumadin held on admit Longstanding left foot contractures Prior falls and multiple fractures 04/2019 Now on hospice secondary to advanced heart failure Sustained a fall 11/01/2020 X-ray showed trimalleolar right ankle fracture was reduced by ED  Hospital-Problem based course  Right trimalleolar fracture status post surgery 11/05/2020 Dr. Doreatha Martin - Pain control, weightbearing status etc. as per orthopedics (Tylenol first choice tramadol 50 every 6 as needed second choice) - Patient family reluctant to take her to [place] asked social work reach out to patient and family with regards to other options  Ischemic CVA 2018, residual left-sided weakness - Continue aspirin, Coumadin resumed per pharmacy -Plavix to be resumed in the outpatient setting if felt to have low risk of bleeding-I have held it at this time  Combined systolic diastolic heart failure EF 35-40% - continue Lasix 80 daily Cozaar 25 daily Toprol-XL 25, volume status seems net negative -2 L  Elevated CBGs without diagnosis diabetes - Only monitoring at this time  HTN - Continue amlodipine 5  Multi-infarct dementia -On hospice for the same - Continue Aricept 5, Namenda 10 twice daily Celexa 20 twice daily  Asthma/COPD overlap - Continue DuoNeb inhaler  Hypothyroid - Continue Synthroid 25 mcg daily-labs in the outpatient setting  Patient is on hospice in the outpatient setting secondary to her underlying CHF diagnosis  Family is undecided about home versus rehab and recommended to them that patient may need short-term rehab Social work arrangement  DVT prophylaxis: Coumadin  Code Status: DNR Family Communication: Discussed with the daughter at  the bedside Disposition:  Status is: Inpatient  Remains inpatient appropriate because:Hemodynamically unstable and Ongoing diagnostic testing needed not appropriate for outpatient work up  Dispo: The patient is from: Home              Anticipated d/c is to: SNF              Patient currently is medically stable to d/c.   Difficult to place patient No       Consultants:  Orthopedics  Procedures: Ankle repair 9/26  Antimicrobials: None at present   Subjective: Doing fair awake alert no distress vital daughter states more sleepy than usual No chest pain no fever but review of systems is a little bit limited secondary to mentation  Objective: Vitals:   11/06/20 1620 11/06/20 2214 11/07/20 0343 11/07/20 0849  BP: (!) 136/43 113/68 (!) 118/45 137/61  Pulse: 80 67 67 71  Resp: 14 17 18 18   Temp: 97.9 F (36.6 C) 97.7 F (36.5 C) 97.7 F (36.5 C) (!) 97.5 F (36.4 C)  TempSrc: Oral Oral Oral Oral  SpO2: 98% 96% 96% 97%  Weight:      Height:        Intake/Output Summary (Last 24 hours) at 11/07/2020 0943 Last data filed at 11/07/2020 0815 Gross per 24 hour  Intake --  Output 900 ml  Net -900 ml   Filed Weights   11/01/20 0952  Weight: 64 kg    Examination:  No distress EOMI NCAT no rales no rhonchi Chest exam Holosystolic murmur across precordium 5/6 murmur Neck soft supple no thyromegaly No lower extremity edema Able to wiggle right lower extremity toes dressing not taken down Abdomen soft A little bit sleepy but  power 5/5 grossly  Data Reviewed: personally reviewed   CBC    Component Value Date/Time   WBC 7.0 11/07/2020 0153   RBC 3.59 (L) 11/07/2020 0153   HGB 10.8 (L) 11/07/2020 0153   HGB 14.3 08/31/2018 1601   HCT 33.2 (L) 11/07/2020 0153   HCT 42.6 08/31/2018 1601   PLT 196 11/07/2020 0153   PLT 243 08/31/2018 1601   MCV 92.5 11/07/2020 0153   MCV 89 08/31/2018 1601   MCH 30.1 11/07/2020 0153   MCHC 32.5 11/07/2020 0153   RDW 14.9  11/07/2020 0153   RDW 14.7 08/31/2018 1601   LYMPHSABS 1.5 07/23/2019 2017   LYMPHSABS 2.6 03/10/2017 0937   MONOABS 0.9 07/23/2019 2017   EOSABS 0.2 07/23/2019 2017   EOSABS 0.0 03/10/2017 0937   BASOSABS 0.1 07/23/2019 2017   BASOSABS 0.0 03/10/2017 0937   CMP Latest Ref Rng & Units 11/07/2020 11/06/2020 11/03/2020  Glucose 70 - 99 mg/dL 118(H) 133(H) 125(H)  BUN 8 - 23 mg/dL 21 16 18   Creatinine 0.44 - 1.00 mg/dL 0.96 0.83 1.11(H)  Sodium 135 - 145 mmol/L 135 134(L) 138  Potassium 3.5 - 5.1 mmol/L 4.4 3.9 4.1  Chloride 98 - 111 mmol/L 101 100 100  CO2 22 - 32 mmol/L 28 27 31   Calcium 8.9 - 10.3 mg/dL 8.6(L) 8.4(L) 8.8(L)  Total Protein 6.5 - 8.1 g/dL - - -  Total Bilirubin 0.3 - 1.2 mg/dL - - -  Alkaline Phos 38 - 126 U/L - - -  AST 15 - 41 U/L - - -  ALT 0 - 44 U/L - - -     Radiology Studies: DG Ankle Complete Right  Result Date: 11/05/2020 CLINICAL DATA:  ORIF of the right ankle. EXAM: RIGHT ANKLE - COMPLETE 3+ VIEW COMPARISON:  X-ray right ankle 11/01/2020 FINDINGS: Intraoperative open reduction and internal fixation of the right ankle. Five low resolution intraoperative spot views of the plate and screw fixation of a distal transsyndesmotic fibular fracture as well as screw fixation of a medial malleolar fracture and the syndesmosis were obtained. No other fracture visible on the limited views. Total fluoroscopy time: 1 minute and 7 seconds Total radiation dose: 0.77 mGy IMPRESSION: Intraoperative open reduction internal fixation of the right ankle with no radiographic findings to suggest complication. Electronically Signed   By: Iven Finn M.D.   On: 11/05/2020 15:07   DG Ankle Right Port  Result Date: 11/05/2020 CLINICAL DATA:  Postop right ankle repair EXAM: PORTABLE RIGHT ANKLE - 2 VIEW COMPARISON:  11/05/20, earlier today.  In 11/01/2020 FINDINGS: Status post plate and screw fixation of the distal fibula. Syndesmotic screws are noted reducing the posterior and medial  malleolar fractures. Fracture fragments are in anatomic alignment. No complicating features identified. IMPRESSION: Status post ORIF of right ankle trimalleolar fracture. Electronically Signed   By: Kerby Moors M.D.   On: 11/05/2020 15:12   DG C-Arm 1-60 Min-No Report  Result Date: 11/05/2020 Fluoroscopy was utilized by the requesting physician.  No radiographic interpretation.     Scheduled Meds:  acetaminophen  650 mg Oral Q6H   Or   acetaminophen  650 mg Rectal Q6H   allopurinol  300 mg Oral QHS   amLODipine  5 mg Oral QHS   aspirin  81 mg Oral Daily   citalopram  20 mg Oral QHS   diphenoxylate-atropine  1 tablet Oral Q M,W,F   docusate sodium  100 mg Oral BID   donepezil  5 mg Oral  Daily   fentaNYL (SUBLIMAZE) injection  100 mcg Intravenous Once   furosemide  80 mg Oral Daily   levothyroxine  25 mcg Oral Q0600   losartan  25 mg Oral Daily   memantine  10 mg Oral BID   metoprolol succinate  25 mg Oral Daily   midazolam  4 mg Intravenous Once   potassium chloride SA  20 mEq Oral Daily   warfarin  5 mg Oral ONCE-1600   Warfarin - Pharmacist Dosing Inpatient   Does not apply q1600   Continuous Infusions:  sodium chloride Stopped (11/05/20 1328)     LOS: 6 days   Time spent: [place], MD [facility] To contact the attending provider between 7A-7P or the covering provider during after hours 7P-7A, please log into the web site [email or web address] and access using universal [place] password for that web site. If you do not have the password, please call the hospital operator.  11/07/2020, 9:43 AM

## 2020-11-07 NOTE — Progress Notes (Signed)
Orthopaedic Trauma Progress Note  SUBJECTIVE: No issues, up with therapy. Daughter feels that she would do better with smaller boot. Asking about ability to walk with ankle   OBJECTIVE:  Vitals:   11/07/20 0343 11/07/20 0849  BP: (!) 118/45 137/61  Pulse: 67 71  Resp: 18 18  Temp: 97.7 F (36.5 C) (!) 97.5 F (36.4 C)  SpO2: 96% 97%    General: Resting comfortably.  Respiratory: No increased work of breathing.  RLE: CAM boot in place. Dressing CDI. Removed this PM with incisions stable, new dressing applied. Able to wiggle toes a small amount. Endorses sensation to light touch over the forefoot.   IMAGING: Stable post op imaging.   LABS:  Results for orders placed or performed during the hospital encounter of 11/01/20 (from the past 24 hour(s))  Protime-INR     Status: Abnormal   Collection Time: 11/07/20  1:53 AM  Result Value Ref Range   Prothrombin Time 19.7 (H) 11.4 - 15.2 seconds   INR 1.7 (H) 0.8 - 1.2  Basic metabolic panel     Status: Abnormal   Collection Time: 11/07/20  1:53 AM  Result Value Ref Range   Sodium 135 135 - 145 mmol/L   Potassium 4.4 3.5 - 5.1 mmol/L   Chloride 101 98 - 111 mmol/L   CO2 28 22 - 32 mmol/L   Glucose, Bld 118 (H) 70 - 99 mg/dL   BUN 21 8 - 23 mg/dL   Creatinine, Ser 0.96 0.44 - 1.00 mg/dL   Calcium 8.6 (L) 8.9 - 10.3 mg/dL   GFR, Estimated 58 (L) >60 mL/min   Anion gap 6 5 - 15  CBC     Status: Abnormal   Collection Time: 11/07/20  1:53 AM  Result Value Ref Range   WBC 7.0 4.0 - 10.5 K/uL   RBC 3.59 (L) 3.87 - 5.11 MIL/uL   Hemoglobin 10.8 (L) 12.0 - 15.0 g/dL   HCT 33.2 (L) 36.0 - 46.0 %   MCV 92.5 80.0 - 100.0 fL   MCH 30.1 26.0 - 34.0 pg   MCHC 32.5 30.0 - 36.0 g/dL   RDW 14.9 11.5 - 15.5 %   Platelets 196 150 - 400 K/uL   nRBC 0.0 0.0 - 0.2 %    ASSESSMENT: Amber [name] is a 86 y.o. female, 2 Days Post-Op s/p ORIF RIGHT ANKLE FRACTURE  CV/Blood loss: Acute blood loss anemia, Hgb 10.8 this  morning.  PLAN: Weightbearing: WBAT RLE in CAM boot ROM: Ok for ankle ROM as tolerated at rest Incisional and dressing care: OK to remove dressings 11/07/20 and leave open to air with dry gauze PRN  Showering: OK to begin showering 11/09/20 if no drainage from incision Orthopedic device(s): CAM boot RLE, will order new boot that is smaller in size Pain management:  1. Tylenol 650 mg q 6 hours scheduled 2. Tramadol 50 mg mg q 6 hours PRN 3. Fentanyl 12.5-50 mcg q 2 hours PRN  VTE prophylaxis: Coumadin, SCDs ID:  Ancef 2gm post op Foley/Lines:  No foley, KVO IVFs Impediments to Fracture Healing: Vit D level 127, no additional supplementation needed Dispo: PT/OT eval today, dispo pending.  Follow - up plan: 2 weeks after d/c for repeat x-rays and wound check. Okay to DC from ortho standpoint  Contact information:  Katha Hamming MD, Patrecia Pace PA-C. After hours and holidays please check [email or web address] for group call information for [facility]  Shona Needles, MD [facility] (  [phone] (office) [email or web address]

## 2020-11-08 LAB — PROTIME-INR
INR: 1.9 — ABNORMAL HIGH (ref 0.8–1.2)
Prothrombin Time: 21.6 seconds — ABNORMAL HIGH (ref 11.4–15.2)

## 2020-11-08 MED ORDER — TRAMADOL HCL 50 MG PO TABS: 50.0000 mg | ORAL_TABLET | Freq: Three times a day (TID) | ORAL | 0 refills | Status: DC | PRN

## 2020-11-08 MED ORDER — POLYETHYLENE GLYCOL 3350 17 G PO PACK: 17.0000 g | PACK | Freq: Every day | ORAL | 0 refills | Status: DC | PRN

## 2020-11-08 MED ORDER — WARFARIN SODIUM 5 MG PO TABS: 5.0000 mg | ORAL_TABLET | Freq: Once | ORAL | Status: DC

## 2020-11-08 MED ORDER — ACETAMINOPHEN 325 MG PO TABS: 650.0000 mg | ORAL_TABLET | Freq: Four times a day (QID) | ORAL | Status: DC

## 2020-11-08 NOTE — TOC Progression Note (Addendum)
Transition of Care [facility]) - Progression Note    Patient Details  Name: Amber [name] MRN: [id number] Date of Birth: 11/24/1934  [facility] CM/SW Contact  Jacalyn Lefevre Edson Snowball, RN Phone Number: 11/08/2020, 9:52 AM  Clinical Narrative:     Spoke to patient and daughter [name] at bedside. They have decided to go home with home health. NCM explained they will need to resend hospice to have home health. Daughter believes hospice will provide home health.   NCM left message for Audrea Muscat with [facility] awaiting call back.   Provided list of home health agencies , daughter reviewing.  MD ordered 3 in 1 patient already has .   MD ordered wheel chair and walker , patient will need that.   Daughter plans to transport patient home in private care at discharge.   NCM ordered wheel chair and walker with Freda Munro with [facility], Freda Munro will need clarification from [facility] to bill hospice or patient's insurance.   Hollins with [facility] called. Patient has home oxygen currently which was covered under her hospice benefit. Once hospice is revoked [facility] will need oxygen saturation note and new order for home oxygen, so [facility] can bill patient's insurance. Secure chatted MD and nurse  Daughter would like [facility] for home health. Stacie with [facility] will accept referral for HHRN,PT,OT once , she receives paperwork that hospice has be revoked.    Audrea Muscat with [facility] returned call. She confirmed patient/family will need to revoke hospice for home health. Audrea Muscat will have an Air traffic controller speak with family. Once paperwork completed, Audrea Muscat will notify NCM. NCM will update Freda Munro with [facility] and Stacie with [facility], once paperwork completed.   Daughter wanting transport chair instead of wheel chair. Called daughter , her father has found transport chair and a walker will update Freda Munro at [employer] . Explained [facility] needs oxygen note and MD order for home  oxygen. Daughter voiced understanding  Per nurse patient worked with OT and sats on room air 92%, patient does not qualify for home oxygen .   Stacie with [facility] updated   Expected Discharge Plan: [place]    Expected Discharge Plan and Services Expected Discharge Plan: [place]   Discharge Planning Services: CM Consult Post Acute Care Choice: Home Health, Durable Medical Equipment Living arrangements for the past 2 months: Single Family Home Expected Discharge Date: 11/08/20               DME Arranged: Gilford Rile rolling DME Agency: [facility] Date DME Agency Contacted: 11/08/20 Time DME Agency Contacted: [phone] Representative spoke with at DME Agency: Freda Munro HH Arranged: PT, OT, RN           Social Determinants of Health ([place]) Interventions    Readmission Risk Interventions No flowsheet data found.

## 2020-11-08 NOTE — Progress Notes (Signed)
Patient discharged to home with daughter. No equipment needs as the family has everything they need at home. AVS reviewed with family.

## 2020-11-08 NOTE — Discharge Summary (Signed)
Physician Discharge Summary  Amber [name]:[id number] DOB: [birth year] DOA: 11/01/2020  PCP: Amber Contras, MD  Admit date: 11/01/2020 Discharge date: 11/08/2020  Time spent: 36 minutes  Recommendations for Outpatient Follow-up:  Aspirin discontinued this admission only Plavix Coumadin Needs CBC Chem-12 in 1 week in addition to INR, needs TSH in about 1 month post hospital stay Tramadol for moderate to severe pain Tylenol for mild pain prescription sent to pharmacy Home health requested in addition to DME 3 in 1, wheelchair, cushion May benefit from outpatient testing for diabetes mellitus although sugars are high normal    Discharge Diagnoses:  MAIN problem for hospitalization    h/o fall with trauma and trimalleolar fracture  Please see below for itemized issues addressed in [place]- refer to other progress notes for clarity if needed  Discharge Condition: Fair  Diet recommendation: Heart healthy  Filed Weights   11/01/20 0952  Weight: 64 kg    History of present illness:  86 year old white female HTN HLD CABG 07/1998, TIA/CVA with left-sided hemiparesis 2018, HFpEF COPD not on home oxygen Chronic atrial fibrillation-Coumadin held on admit Longstanding left foot contractures Prior falls and multiple fractures 04/2019 Now on hospice secondary to advanced heart failure Sustained a fall 11/01/2020 X-ray showed trimalleolar right ankle fracture was reduced by ED  Hospital Course:  Right trimalleolar fracture status post surgery 11/05/2020 Dr. Doreatha Martin - Pain control, weightbearing status etc. as per orthopedics (Tylenol first choice tramadol 50 every 6 as needed second choice--prescription given on discharge) - Patient family reluctant to take her to skilled-patient will discharge with home health 3 and 1 wheelchair and cushion will need follow-up with Dr. Doreatha Martin in the outpatient setting   Ischemic CVA 2018, residual left-sided weakness - Family clarified that they had  been told to discontinue aspirin - On discharge continue Plavix and Coumadin needs INR check probably in about a week  Combined systolic diastolic heart failure EF 35-40% - continue Lasix 80 daily Cozaar 25 daily Toprol-XL 25, volume status seems net negative -2 L and we will continue all meds without other issues   Elevated CBGs without diagnosis diabetes - Only monitoring at this time   HTN - Continue amlodipine 5  Multi-infarct dementia -On hospice for the same - Continue Aricept 5, Namenda 10 twice daily Celexa 20 twice daily continued on discharge  Asthma/COPD overlap - Continue DuoNeb inhaler   Hypothyroid - Continue Synthroid 25 mcg daily-labs in the outpatient setting   Discharge Exam: Vitals:   11/08/20 0405 11/08/20 0910  BP: (!) 148/62 (!) 144/71  Pulse: 84 84  Resp: 18 19  Temp: 97.7 F (36.5 C) 98.6 F (37 C)  SpO2: 97% 95%    Subj on day of d/c   Awake slightly confused no distress Pain seems manageable although she is not completely clear she is able to verbalize to me that she is in [place] and knows the hospital No overt pain  General Exam on discharge  Awake alert minimally confused no distress [id number] holosystolic murmur Abdomen soft slightly distended no hepatosplenomegaly No lower extremity edema on left side right side slightly swollen some ecchymosis bandage over right ankle wound not reviewed Chest clear no added sound no rales no rhonchi Neurologically grossly intact  Discharge Instructions    Allergies as of 11/08/2020       Reactions   Ezetimibe Other (See Comments)   Myalgia   Welchol [colesevelam Hcl] Other (See Comments)   Muscle aches   Adhesive [tape] Itching, Rash,  Other (See Comments)   Burning    Ceclor [cefaclor] Other (See Comments)   Unknown allergic reaction   Elastic Bandages & [zinc] Rash, Other (See Comments)   Turns red on the areas it touches   Latex Itching, Rash, Other (See Comments)   Burning     Lipitor [atorvastatin Calcium] Other (See Comments)   Increased fibromyalgia pain   Mevacor [lovastatin] Other (See Comments)   Increased fibromyalgia pain   Pravachol Other (See Comments)   Increased fibromyalgia pain   Vasotec Other (See Comments)   Unknown allergic reaction        Medication List     TAKE these medications    acetaminophen 325 MG tablet Commonly known as: TYLENOL Take 2 tablets (650 mg total) by mouth every 6 (six) hours.   allopurinol 300 MG tablet Commonly known as: ZYLOPRIM Take 300 mg by mouth at bedtime.   amLODipine 5 MG tablet Commonly known as: NORVASC TAKE 1 TABLET BY MOUTH EVERY DAY What changed: when to take this   citalopram 20 MG tablet Commonly known as: CELEXA TAKE 1 TABLET BY MOUTH TWICE A DAY What changed: when to take this   clopidogrel 75 MG tablet Commonly known as: PLAVIX TAKE 1 TABLET (75 MG TOTAL) BY MOUTH DAILY.   diphenoxylate-atropine 2.5-0.025 MG tablet Commonly known as: LOMOTIL Take 1 tablet by mouth every Monday, Wednesday, and Friday.   donepezil 5 MG tablet Commonly known as: ARICEPT Take 1 tablet (5 mg total) by mouth daily.   ergocalciferol 1.25 MG (50000 UT) capsule Commonly known as: VITAMIN D2 Take 50,000 Units by mouth 2 (two) times a week. Tuesday and Friday   furosemide 80 MG tablet Commonly known as: LASIX Take 1 tablet (80 mg total) by mouth daily.   ipratropium-albuterol 0.5-2.5 (3) MG/3ML Soln Commonly known as: DUONEB Take 3 mLs by nebulization every 4 (four) hours as needed (Use at home). What changed: reasons to take this   levothyroxine 25 MCG tablet Commonly known as: SYNTHROID Take 25 mcg by mouth every morning.   losartan 25 MG tablet Commonly known as: COZAAR Take 1 tablet (25 mg total) by mouth daily.   memantine 10 MG tablet Commonly known as: Namenda Take 1 tablet (10 mg total) by mouth 2 (two) times daily.   metoprolol succinate 25 MG 24 hr tablet Commonly known as:  TOPROL-XL TAKE 1 TABLET BY MOUTH EVERY DAY   nitroGLYCERIN 0.4 MG SL tablet Commonly known as: NITROSTAT Place 1 tablet (0.4 mg total) under the tongue every 5 (five) minutes as needed for chest pain.   polyethylene glycol 17 g packet Commonly known as: MIRALAX / GLYCOLAX Take 17 g by mouth daily as needed for mild constipation.   potassium chloride SA 20 MEQ tablet Commonly known as: Klor-Con M20 TAKE 1 TABLET EVERY OTHER DAY, ALTERNATING WITH 2 TABLETS EVERY OTHER DAY What changed:  how much to take how to take this when to take this additional instructions   traMADol 50 MG tablet Commonly known as: ULTRAM Take 1 tablet (50 mg total) by mouth every 8 (eight) hours as needed for moderate pain or severe pain.   warfarin 5 MG tablet Commonly known as: COUMADIN Take as directed. If you are unsure how to take this medication, talk to your nurse or doctor. Original instructions: TAKE AS DIRECTED BY COUMADIN CLINIC What changed: See the new instructions.   ZINC-VITAMIN C PO Take 1 tablet by mouth every evening.  Durable Medical Equipment  (From admission, onward)           Start     Ordered   11/08/20 0929  DME 3-in-1  Once        11/08/20 0930   11/08/20 0929  DME standard manual wheelchair with seat cushion  Once       Comments: Patient suffers from fall and fracture which impairs their ability to perform daily activities like bathing, dressing, and feeding in the home.  A crutch will not resolve issue with performing activities of daily living. A wheelchair will allow patient to safely perform daily activities. Patient can safely propel the wheelchair in the home or has a caregiver who can provide assistance. Length of need Lifetime. Accessories: elevating leg rests (ELRs), wheel locks, extensions and anti-tippers.   11/08/20 0930           Allergies  Allergen Reactions   Ezetimibe Other (See Comments)    Myalgia   Welchol [Colesevelam Hcl]  Other (See Comments)    Muscle aches   Adhesive [Tape] Itching, Rash and Other (See Comments)    Burning    Ceclor [Cefaclor] Other (See Comments)    Unknown allergic reaction   Elastic Bandages & [Zinc] Rash and Other (See Comments)    Turns red on the areas it touches   Latex Itching, Rash and Other (See Comments)    Burning    Lipitor [Atorvastatin Calcium] Other (See Comments)    Increased fibromyalgia pain   Mevacor [Lovastatin] Other (See Comments)    Increased fibromyalgia pain   Pravachol Other (See Comments)    Increased fibromyalgia pain   Vasotec Other (See Comments)    Unknown allergic reaction      The results of significant diagnostics from this hospitalization (including imaging, microbiology, ancillary and laboratory) are listed below for reference.    Significant Diagnostic Studies: DG Ankle 2 Views Right  Result Date: 11/01/2020 CLINICAL DATA:  Postreduction right ankle fracture dislocation EXAM: RIGHT ANKLE - 2 VIEW COMPARISON:  11/01/2020 right ankle radiographs FINDINGS: External cast obscures fine bone detail. Comminuted medial malleolus fracture without significant residual displacement. Comminuted distal right fibular metaphysis fracture with minimal residual 2 mm lateral displacement of the dominant distal fracture fragment. Probable nondisplaced posterior malleolar fracture in the posterior distal right tibia. Persistent mild 3-4 mm lateral subluxation of the talus at the ankle mortise, significantly improved. No focal osseous lesions. Mild degenerative changes in the dorsal tarsal joints. No radiopaque foreign bodies. Diffuse right ankle soft tissue swelling. IMPRESSION: Near-anatomic alignment of trimalleolar right ankle fractures as detailed. Persistent mild 3-4 mm lateral subluxation of the talus at the ankle mortise, significantly improved. Electronically Signed   By: Ilona Sorrel M.D.   On: 11/01/2020 12:05   DG Ankle 2 Views Right  Result Date:  11/01/2020 CLINICAL DATA:  RIGHT ankle deformity post fall EXAM: RIGHT ANKLE - 2 VIEW COMPARISON:  None FINDINGS: Osseous demineralization. Oblique fracture distal RIGHT fibula, laterally displaced with overriding and apex medial angulation. Transverse fracture medial malleolus displaced laterally. Probable small posterior malleolar fracture. Marked lateral subluxation versus dislocation of tibiotalar joint. No additional fracture or bone destruction. Associated soft tissue swelling. IMPRESSION: Displaced trimalleolar fractures of the RIGHT ankle with marked lateral subluxation versus dislocation of the talus at the tibiotalar joint. Electronically Signed   By: Lavonia Dana M.D.   On: 11/01/2020 11:02   DG Ankle Complete Right  Result Date: 11/05/2020 CLINICAL DATA:  ORIF of the right  ankle. EXAM: RIGHT ANKLE - COMPLETE 3+ VIEW COMPARISON:  X-ray right ankle 11/01/2020 FINDINGS: Intraoperative open reduction and internal fixation of the right ankle. Five low resolution intraoperative spot views of the plate and screw fixation of a distal transsyndesmotic fibular fracture as well as screw fixation of a medial malleolar fracture and the syndesmosis were obtained. No other fracture visible on the limited views. Total fluoroscopy time: 1 minute and 7 seconds Total radiation dose: 0.77 mGy IMPRESSION: Intraoperative open reduction internal fixation of the right ankle with no radiographic findings to suggest complication. Electronically Signed   By: Iven Finn M.D.   On: 11/05/2020 15:07   CT HEAD WO CONTRAST (5MM)  Result Date: 11/01/2020 CLINICAL DATA:  Fall EXAM: CT HEAD WITHOUT CONTRAST CT CERVICAL SPINE WITHOUT CONTRAST TECHNIQUE: Multidetector CT imaging of the head and cervical spine was performed following the standard protocol without intravenous contrast. Multiplanar CT image reconstructions of the cervical spine were also generated. COMPARISON:  None. FINDINGS: CT HEAD FINDINGS Brain: No evidence of  acute infarction, hemorrhage, hydrocephalus, extra-axial collection or mass lesion/mass effect. Periventricular and deep white matter hypodensity. Mild, global cerebral volume loss. Vascular: No hyperdense vessel or unexpected calcification. Skull: Normal. Negative for fracture or focal lesion. Sinuses/Orbits: No acute finding. Other: None. CT CERVICAL SPINE FINDINGS Alignment: Normal. Skull base and vertebrae: No acute fracture. No primary bone lesion or focal pathologic process. Soft tissues and spinal canal: No prevertebral fluid or swelling. No visible canal hematoma. Disc levels: Mild multilevel disc space height loss and osteophytosis. Upper chest: Negative. Other: None. IMPRESSION: 1. No acute intracranial pathology. Small-vessel white matter disease and mild, global cerebral volume loss. 2. No fracture or static subluxation of the cervical spine. 3. Mild multilevel disc space height loss and osteophytosis. Electronically Signed   By: Eddie Candle M.D.   On: 11/01/2020 12:23   CT Cervical Spine Wo Contrast  Result Date: 11/01/2020 CLINICAL DATA:  Fall EXAM: CT HEAD WITHOUT CONTRAST CT CERVICAL SPINE WITHOUT CONTRAST TECHNIQUE: Multidetector CT imaging of the head and cervical spine was performed following the standard protocol without intravenous contrast. Multiplanar CT image reconstructions of the cervical spine were also generated. COMPARISON:  None. FINDINGS: CT HEAD FINDINGS Brain: No evidence of acute infarction, hemorrhage, hydrocephalus, extra-axial collection or mass lesion/mass effect. Periventricular and deep white matter hypodensity. Mild, global cerebral volume loss. Vascular: No hyperdense vessel or unexpected calcification. Skull: Normal. Negative for fracture or focal lesion. Sinuses/Orbits: No acute finding. Other: None. CT CERVICAL SPINE FINDINGS Alignment: Normal. Skull base and vertebrae: No acute fracture. No primary bone lesion or focal pathologic process. Soft tissues and spinal  canal: No prevertebral fluid or swelling. No visible canal hematoma. Disc levels: Mild multilevel disc space height loss and osteophytosis. Upper chest: Negative. Other: None. IMPRESSION: 1. No acute intracranial pathology. Small-vessel white matter disease and mild, global cerebral volume loss. 2. No fracture or static subluxation of the cervical spine. 3. Mild multilevel disc space height loss and osteophytosis. Electronically Signed   By: Eddie Candle M.D.   On: 11/01/2020 12:23   DG Ankle Right Port  Result Date: 11/05/2020 CLINICAL DATA:  Postop right ankle repair EXAM: PORTABLE RIGHT ANKLE - 2 VIEW COMPARISON:  11/05/20, earlier today.  In 11/01/2020 FINDINGS: Status post plate and screw fixation of the distal fibula. Syndesmotic screws are noted reducing the posterior and medial malleolar fractures. Fracture fragments are in anatomic alignment. No complicating features identified. IMPRESSION: Status post ORIF of right ankle trimalleolar fracture. Electronically  Signed   By: Kerby Moors M.D.   On: 11/05/2020 15:12   DG C-Arm 1-60 Min-No Report  Result Date: 11/05/2020 Fluoroscopy was utilized by the requesting physician.  No radiographic interpretation.   ECHOCARDIOGRAM COMPLETE  Result Date: 11/02/2020    ECHOCARDIOGRAM REPORT   Patient Name:   Amber [name] Date of Exam: 11/02/2020 Medical Rec #:  [id number]      Height:       65.0 in Accession #:    [id number]     Weight:       141.1 lb Date of Birth:  [birth year]      BSA:          1.706 m Patient Age:    26 years       BP:           125/96 mmHg Patient Gender: F              HR:           65 bpm. Exam Location:  Inpatient Procedure: 2D Echo, Cardiac Doppler, Color Doppler and Intracardiac            Opacification Agent Indications:    I50.40* Unspecified combined systolic (congestive) and diastolic                 (congestive) heart failure  History:        Patient has prior history of Echocardiogram examinations, most                 recent  09/14/2019. CHF, Abnormal ECG, COPD, Aortic Valve Disease,                 Arrythmias:Atrial Fibrillation, Signs/Symptoms:Shortness of                 Breath and Dyspnea; Risk Factors:Dyslipidemia.  Sonographer:    Roseanna Rainbow RDCS Referring Phys: [id number] [place]  Sonographer Comments: Technically difficult study due to poor echo windows. IMPRESSIONS  1. Left ventricular ejection fraction, by estimation, is 50 to 55%. The left ventricle has low normal function. The left ventricle demonstrates regional wall motion abnormalities (see scoring diagram/findings for description). Left ventricular diastolic  function could not be evaluated. There is mild hypokinesis of the left ventricular, basal-mid anteroseptal wall.  2. Right ventricular systolic function is normal. The right ventricular size is normal. There is normal pulmonary artery systolic pressure.  3. Left atrial size was moderately dilated.  4. Right atrial size was mild to moderately dilated.  5. The mitral valve is grossly normal. Trivial mitral valve regurgitation. Moderate to severe mitral annular calcification.  6. The aortic valve is calcified. There is moderate calcification of the aortic valve. There is moderate thickening of the aortic valve. Aortic valve regurgitation is not visualized. Moderate aortic valve stenosis. Aortic valve mean gradient measures 21.8 mmHg. Aortic valve Vmax measures 3.04 m/s.  7. The inferior vena cava is dilated in size with >50% respiratory variability, suggesting right atrial pressure of 8 mmHg. Comparison(s): Prior images reviewed side by side. Changes from prior study are noted. Wall motion abnormality at the basal anteroseptum is now only mild, previously severe. EF improved. FINDINGS  Left Ventricle: Left ventricular ejection fraction, by estimation, is 50 to 55%. The left ventricle has low normal function. The left ventricle demonstrates regional wall motion abnormalities. Mild hypokinesis of the left  ventricular, basal-mid anteroseptal wall. Definity contrast agent was given IV to delineate the left ventricular endocardial borders. The left  ventricular internal cavity size was normal in size. There is no left ventricular hypertrophy. Left ventricular diastolic function could not be evaluated due to atrial fibrillation. Left ventricular diastolic function could not be evaluated. Right Ventricle: The right ventricular size is normal. Right vetricular wall thickness was not well visualized. Right ventricular systolic function is normal. There is normal pulmonary artery systolic pressure. The tricuspid regurgitant velocity is 1.85 m/s, and with an assumed right atrial pressure of 8 mmHg, the estimated right ventricular systolic pressure is 78.6 mmHg. Left Atrium: Left atrial size was moderately dilated. Right Atrium: Right atrial size was mild to moderately dilated. Pericardium: There is no evidence of pericardial effusion. Mitral Valve: The mitral valve is grossly normal. Moderate to severe mitral annular calcification. Trivial mitral valve regurgitation. MV peak gradient, 13.1 mmHg. The mean mitral valve gradient is 3.5 mmHg. Tricuspid Valve: The tricuspid valve is normal in structure. Tricuspid valve regurgitation is mild . No evidence of tricuspid stenosis. Aortic Valve: The aortic valve is calcified. There is moderate calcification of the aortic valve. There is moderate thickening of the aortic valve. Aortic valve regurgitation is not visualized. Moderate aortic stenosis is present. Aortic valve mean gradient measures 21.8 mmHg. Aortic valve peak gradient measures 37.0 mmHg. Aortic valve area, by VTI measures 0.62 cm. Pulmonic Valve: The pulmonic valve was not well visualized. Pulmonic valve regurgitation is not visualized. Aorta: The aortic root, ascending aorta, aortic arch and descending aorta are all structurally normal, with no evidence of dilitation or obstruction. Venous: The inferior vena cava is  dilated in size with greater than 50% respiratory variability, suggesting right atrial pressure of 8 mmHg. IAS/Shunts: The atrial septum is grossly normal.  LEFT VENTRICLE PLAX 2D LVIDd:         4.40 cm LVIDs:         3.75 cm LV PW:         1.00 cm LV IVS:        0.95 cm LVOT diam:     1.80 cm LV SV:         46 LV SV Index:   27 LVOT Area:     2.54 cm  LV Volumes (MOD) LV vol d, MOD A4C: 61.6 ml LV vol s, MOD A4C: 25.1 ml LV SV MOD A4C:     61.6 ml RIGHT VENTRICLE            IVC RV S prime:     9.46 cm/s  IVC diam: 2.30 cm TAPSE (M-mode): 1.7 cm LEFT ATRIUM             Index       RIGHT ATRIUM           Index LA diam:        4.30 cm 2.52 cm/m  RA Area:     16.50 cm LA Vol (A2C):   64.8 ml 37.99 ml/m RA Volume:   45.70 ml  26.80 ml/m LA Vol (A4C):   42.5 ml 24.92 ml/m LA Biplane Vol: 54.1 ml 31.72 ml/m  AORTIC VALVE AV Area (Vmax):    0.76 cm AV Area (Vmean):   0.70 cm AV Area (VTI):     0.62 cm AV Vmax:           304.20 cm/s AV Vmean:          220.200 cm/s AV VTI:            0.736 m AV Peak Grad:      37.0  mmHg AV Mean Grad:      21.8 mmHg LVOT Vmax:         91.00 cm/s LVOT Vmean:        60.800 cm/s LVOT VTI:          0.181 m LVOT/AV VTI ratio: 0.25  AORTA Ao Root diam: 2.70 cm Ao Asc diam:  3.00 cm MITRAL VALVE                TRICUSPID VALVE MV Area (PHT): 3.35 cm     TR Peak grad:   13.7 mmHg MV Area VTI:   0.87 cm     TR Vmax:        185.00 cm/s MV Peak grad:  13.1 mmHg MV Mean grad:  3.5 mmHg     SHUNTS MV Vmax:       1.81 m/s     Systemic VTI:  0.18 m MV Vmean:      83.0 cm/s    Systemic Diam: 1.80 cm MV Decel Time: 227 msec MV E velocity: 160.00 cm/s Buford Dresser MD Electronically signed by Buford Dresser MD Signature Date/Time: 11/02/2020/5:02:13 PM    Final     Microbiology: Recent Results (from the past 240 hour(s))  SARS CORONAVIRUS 2 (TAT 6-24 HRS) Nasopharyngeal Nasopharyngeal Swab     Status: None   Collection Time: 11/01/20 12:07 PM   Specimen: Nasopharyngeal Swab   Result Value Ref Range Status   SARS Coronavirus 2 NEGATIVE NEGATIVE Final    Comment: (NOTE) SARS-CoV-2 target nucleic acids are NOT DETECTED.  The SARS-CoV-2 RNA is generally detectable in upper and lower respiratory specimens during the acute phase of infection. Negative results do not preclude SARS-CoV-2 infection, do not rule out co-infections with other pathogens, and should not be used as the sole basis for treatment or other patient management decisions. Negative results must be combined with clinical observations, patient history, and epidemiological information. The expected result is Negative.  Fact Sheet for Patients: SugarRoll.be  Fact Sheet for Healthcare Providers: https://www.woods-mathews.com/  This test is not yet approved or cleared by the [place] FDA and  has been authorized for detection and/or diagnosis of SARS-CoV-2 by FDA under an Emergency Use Authorization (EUA). This EUA will remain  in effect (meaning this test can be used) for the duration of the COVID-19 declaration under Se ction 564(b)(1) of the Act, 21 U.S.C. section 360bbb-3(b)(1), unless the authorization is terminated or revoked sooner.  Performed at [facility], [place] [address] [place], [place] [address]   Surgical pcr screen     Status: None   Collection Time: 11/05/20  9:05 AM   Specimen: Nasal Mucosa; Nasal Swab  Result Value Ref Range Status   MRSA, PCR NEGATIVE NEGATIVE Final   Staphylococcus aureus NEGATIVE NEGATIVE Final    Comment: (NOTE) The Xpert SA Assay (FDA approved for NASAL specimens in patients 79 years of age and older), is one component of a comprehensive surveillance program. It is not intended to diagnose infection nor to guide or monitor treatment. Performed at [facility], [place] [address] [place], [place] [address]      Labs: Basic Metabolic Panel: Recent Labs  Lab 11/01/20 1024 11/02/20 0053  11/03/20 0323 11/06/20 0333 11/07/20 0153  NA 141 139 138 134* 135  K 3.8 3.4* 4.1 3.9 4.4  CL 101 102 100 100 101  CO2 29 28 31 27 28   GLUCOSE 134* 133* 125* 133* 118*  BUN 14 13 18  16 21  CREATININE 0.90 0.95 1.11* 0.83 0.96  CALCIUM 9.1 9.0 8.8* 8.4* 8.6*   Liver Function Tests: No results for input(s): AST, ALT, ALKPHOS, BILITOT, PROT, ALBUMIN in the last 168 hours. No results for input(s): LIPASE, AMYLASE in the last 168 hours. No results for input(s): AMMONIA in the last 168 hours. CBC: Recent Labs  Lab 11/01/20 1024 11/02/20 0053 11/06/20 0333 11/07/20 0153  WBC 8.0 8.0 9.8 7.0  HGB 12.9 12.3 11.0* 10.8*  HCT 40.5 37.4 33.8* 33.2*  MCV 94.2 91.9 92.6 92.5  PLT 182 178 200 196   Cardiac Enzymes: No results for input(s): CKTOTAL, CKMB, CKMBINDEX, TROPONINI in the last 168 hours. BNP: BNP (last 3 results) Recent Labs    11/01/20 1024  BNP 233.2*    ProBNP (last 3 results) Recent Labs    09/06/20 1040  PROBNP 5,862*    CBG: No results for input(s): GLUCAP in the last 168 hours.     Signed:  Nita Sells MD   [facility] 11/08/2020, 9:26 AM

## 2020-11-08 NOTE — Progress Notes (Signed)
Occupational Therapy Treatment Patient Details Name: Amber [name] MRN: [id number] DOB: 08-25-1934 Today's Date: 11/08/2020   History of present illness 86 yo female presenting to ED on 9/2 with fall at home. Imaging showing R ankle fx. Underwent ORIF on 9/26. PMH including cervical spinal stenosis, a-fib, COPD, dementia, CHF, CAD, depresssion, fibromyalgia, HTN, OA, and fall sustaining L rib fx (2001).   OT comments  Pt progressing towards established OT goals. Pt performing functional mobility with Min Guard-Min A and RW. Providing family education on safe LB dressing techniques, shower transfer techniques, and toileting. Update dc place to home with [facility] and will continue to follow acutely as admitted.    Recommendations for follow up therapy are one component of a multi-disciplinary discharge planning process, led by the attending physician.  Recommendations may be updated based on patient status, additional functional criteria and insurance authorization.    Follow Up Recommendations  Home health OT;Supervision/Assistance - 24 hour    Equipment Recommendations  Other (comment) (Family reports they have neeed DME)    Recommendations for Other Services Rehab consult    Precautions / Restrictions Precautions Precautions: Fall Required Braces or Orthoses: Other Brace;Splint/Cast Splint/Cast: CAM boot RLE Other Brace: AFO LLE Restrictions Weight Bearing Restrictions: Yes RLE Weight Bearing: Weight bearing as tolerated Other Position/Activity Restrictions: RLE WBAT in CAM boot       Mobility Bed Mobility Overal bed mobility: Needs Assistance Bed Mobility: Supine to Sit     Supine to sit: Min assist;HOB elevated     General bed mobility comments: In recliner upon arrival    Transfers Overall transfer level: Needs assistance Equipment used: Rolling walker (2 wheeled) Transfers: Sit to/from Stand Sit to Stand: Mod assist         General transfer comment: Mod A for  power up into standing    Balance Overall balance assessment: Needs assistance Sitting-balance support: No upper extremity supported;Feet supported Sitting balance-Leahy Scale: Fair     Standing balance support: Bilateral upper extremity supported Standing balance-Leahy Scale: Poor Standing balance comment: reliant on UE support                           ADL either performed or assessed with clinical judgement   ADL Overall ADL's : Needs assistance/impaired                       Lower Body Dressing Details (indicate cue type and reason): Educating pt's daughter on donning R leg first Toilet Transfer: Moderate assistance;Ambulation;RW (simulated to recliner) Toilet Transfer Details (indicate cue type and reason): Mod A for power up into standing       Tub/Shower Transfer Details (indicate cue type and reason): Edcuating on going backwards with RW into shower to increased safety Functional mobility during ADLs: Minimal assistance;+2 for safety/equipment;Rolling walker General ADL Comments: Pt with increased lethargy and grogginess this session.     Vision       Perception     Praxis      Cognition Arousal/Alertness: Awake/alert Behavior During Therapy: WFL for tasks assessed/performed Overall Cognitive Status: History of cognitive impairments - at baseline                                 General Comments: More alert this session and very agreeable to therapy        Exercises  Shoulder Instructions       General Comments Daughter, [name], present throughout session    Pertinent Vitals/ Pain       Pain Assessment: Faces Faces Pain Scale: Hurts little more Pain Location: Rt ankle Pain Descriptors / Indicators: Grimacing;Guarding Pain Intervention(s): Limited activity within patient's tolerance  Home Living                                          Prior Functioning/Environment               Frequency  Min 2X/week        Progress Toward Goals  OT Goals(current goals can now be found in the care plan section)     Acute Rehab OT Goals Patient Stated Goal: Go home OT Goal Formulation: With patient Time For Goal Achievement: 11/17/20 Potential to Achieve Goals: Good ADL Goals Pt Will Perform Upper Body Bathing: with supervision;sitting Pt Will Perform Lower Body Bathing: with min assist;sitting/lateral leans;sit to/from stand Pt Will Transfer to Toilet: with min assist;stand pivot transfer;bedside commode Pt Will Perform Toileting - Clothing Manipulation and hygiene: with min assist;sitting/lateral leans;sit to/from stand  Plan Discharge plan remains appropriate    Co-evaluation                 AM-PAC OT "6 Clicks" Daily Activity     Outcome Measure   Help from another person eating meals?: A Little Help from another person taking care of personal grooming?: A Little Help from another person toileting, which includes using toliet, bedpan, or urinal?: A Lot Help from another person bathing (including washing, rinsing, drying)?: A Lot Help from another person to put on and taking off regular upper body clothing?: A Little Help from another person to put on and taking off regular lower body clothing?: A Lot 6 Click Score: 15    End of Session Equipment Utilized During Treatment: Gait belt  OT Visit Diagnosis: Unsteadiness on feet (R26.81);Other abnormalities of gait and mobility (R26.89);Muscle weakness (generalized) (M62.81);Pain Pain - Right/Left: Right Pain - part of body: Leg   Activity Tolerance Patient limited by lethargy   Patient Left in chair;with call bell/phone within reach;with chair alarm set;with family/visitor present   Nurse Communication Mobility status        Time: 1236-1300 OT Time Calculation (min): 24 min  Charges: OT General Charges $OT Visit: 1 Visit OT Treatments $Self Care/Home Management : 23-37 mins  [place], OTR/L Acute Rehab Pager: [phone] Office: [place] 11/08/2020, 2:07 PM

## 2020-11-08 NOTE — Progress Notes (Signed)
Physical Therapy Treatment Patient Details Name: Amber [name] MRN: [id number] DOB: 1934-06-15 Today's Date: 11/08/2020   History of Present Illness 86 yo female presenting to ED on 9/2 with fall at home. Imaging showing R ankle fx. Underwent ORIF on 9/26. PMH including cervical spinal stenosis, a-fib, COPD, dementia, CHF, CAD, depresssion, fibromyalgia, HTN, OA, and fall sustaining L rib fx (2001).    PT Comments    Pt with much improved mobility today and able to amb short distance in room with assistance. Pt's requires the most assistance with sit to stand and has a lift chair (she sleeps in) at home that should make this easier for her and her caregivers until she recovers. From PT standpoint pt ready for dc home with family.   Recommendations for follow up therapy are one component of a multi-disciplinary discharge planning process, led by the attending physician.  Recommendations may be updated based on patient status, additional functional criteria and insurance authorization.  Follow Up Recommendations  Home health PT;Supervision/Assistance - 24 hour     Equipment Recommendations  Rolling walker with 5" wheels    Recommendations for Other Services       Precautions / Restrictions Precautions Precautions: Fall Required Braces or Orthoses: Other Brace;Splint/Cast Splint/Cast: CAM boot RLE Other Brace: AFO LLE Restrictions Weight Bearing Restrictions: Yes RLE Weight Bearing: Weight bearing as tolerated Other Position/Activity Restrictions: RLE WBAT in CAM boot     Mobility  Bed Mobility Overal bed mobility: Needs Assistance Bed Mobility: Supine to Sit     Supine to sit: Min assist;HOB elevated     General bed mobility comments: Assist to bring legs off bed and to elevate trunk into sitting    Transfers Overall transfer level: Needs assistance Equipment used: Rolling walker (2 wheeled) Transfers: Sit to/from Stand Sit to Stand: +2 physical assistance;Mod  assist         General transfer comment: Assist to bring hips up and to correct posterior lean  Ambulation/Gait Ambulation/Gait assistance: Min assist Gait Distance (Feet): 20 Feet Assistive device: Rolling walker (2 wheeled) Gait Pattern/deviations: Step-to pattern;Decreased step length - right;Decreased step length - left Gait velocity: decr Gait velocity interpretation: <1.31 ft/sec, indicative of household ambulator General Gait Details: Assist for balance and safety and to occasionally shift walker. Pt with tendency to angle both feet toward the rt.   Stairs             Wheelchair Mobility    Modified Rankin (Stroke Patients Only)       Balance Overall balance assessment: Needs assistance Sitting-balance support: No upper extremity supported;Feet supported Sitting balance-Leahy Scale: Fair     Standing balance support: Bilateral upper extremity supported Standing balance-Leahy Scale: Poor Standing balance comment: walker and min assist for static standing                            Cognition Arousal/Alertness: Awake/alert Behavior During Therapy: WFL for tasks assessed/performed Overall Cognitive Status: History of cognitive impairments - at baseline                                        Exercises      General Comments General comments (skin integrity, edema, etc.): daughter present during treatment      Pertinent Vitals/Pain Pain Assessment: Faces Faces Pain Scale: Hurts little more Pain Location: Rt ankle Pain  Descriptors / Indicators: Grimacing;Guarding Pain Intervention(s): Limited activity within patient's tolerance    Home Living                      Prior Function            PT Goals (current goals can now be found in the care plan section) Acute Rehab PT Goals Patient Stated Goal: Go home Progress towards PT goals: Progressing toward goals    Frequency    Min 3X/week      PT Plan  Discharge plan needs to be updated    Co-evaluation              AM-PAC PT "6 Clicks" Mobility   Outcome Measure  Help needed turning from your back to your side while in a flat bed without using bedrails?: A Little Help needed moving from lying on your back to sitting on the side of a flat bed without using bedrails?: A Little Help needed moving to and from a bed to a chair (including a wheelchair)?: Total Help needed standing up from a chair using your arms (e.g., wheelchair or bedside chair)?: Total Help needed to walk in hospital room?: A Little Help needed climbing 3-5 steps with a railing? : Total 6 Click Score: 12    End of Session Equipment Utilized During Treatment: Gait belt Activity Tolerance: Patient tolerated treatment well Patient left: in chair;with call bell/phone within reach;with family/visitor present Nurse Communication: Mobility status PT Visit Diagnosis: Other abnormalities of gait and mobility (R26.89) Pain - Right/Left: Right Pain - part of body: Ankle and joints of foot     Time: 1100-1118 PT Time Calculation (min) (ACUTE ONLY): 18 min  Charges:  $Gait Training: 8-22 mins                     [place] Pager [phone] Office [place] 11/08/2020, 11:32 AM

## 2020-11-08 NOTE — Progress Notes (Signed)
ANTICOAGULATION CONSULT NOTE - Follow Up Consult  Pharmacy Consult for Warfarin Indication: atrial fibrillation  Allergies  Allergen Reactions   Ezetimibe Other (See Comments)    Myalgia   Welchol [Colesevelam Hcl] Other (See Comments)    Muscle aches   Adhesive [Tape] Itching, Rash and Other (See Comments)    Burning    Ceclor [Cefaclor] Other (See Comments)    Unknown allergic reaction   Elastic Bandages & [Zinc] Rash and Other (See Comments)    Turns red on the areas it touches   Latex Itching, Rash and Other (See Comments)    Burning    Lipitor [Atorvastatin Calcium] Other (See Comments)    Increased fibromyalgia pain   Mevacor [Lovastatin] Other (See Comments)    Increased fibromyalgia pain   Pravachol Other (See Comments)    Increased fibromyalgia pain   Vasotec Other (See Comments)    Unknown allergic reaction    Patient Measurements: Height: 5\' 5"  (165.1 cm) Weight: 64 kg (141 lb 1.5 oz) IBW/kg (Calculated) : 57   Vital Signs: Temp: 98.6 F (37 C) (09/29 0910) Temp Source: Oral (09/29 0910) BP: 144/71 (09/29 0910) Pulse Rate: 84 (09/29 0910)  Labs: Recent Labs    11/06/20 0333 11/06/20 0454 11/07/20 0153 11/08/20 0738  HGB 11.0*  --  10.8*  --   HCT 33.8*  --  33.2*  --   PLT 200  --  196  --   LABPROT  --  17.2* 19.7* 21.6*  INR  --  1.4* 1.7* 1.9*  CREATININE 0.83  --  0.96  --      Estimated Creatinine Clearance: 39.3 mL/min (by C-G formula based on SCr of 0.96 mg/dL).   Medical History: Past Medical History:  Diagnosis Date   Abnormality of gait 08/02/2014   Cerebrovascular disease 05/21/2016   Cervical spinal stenosis    Chronic atrial fibrillation (HCC)    a. chronic/rate controlled;  b. chronic coumadin.   Chronic combined systolic and diastolic CHF (congestive heart failure) (HCC)    CKD (chronic kidney disease), stage III (HCC)    COPD with emphysema (HCC)    PFT 05/02/10>>FEV1 1.35(62%), FEV1% 66, DLCO 75%   Coronary artery  disease    a. s/p CABG;  b. Abnormal nuc 2015 - managed medically.   CVA (cerebral vascular accident) ([place])    left sided weakness   Depression    Fibromyalgia    GERD (gastroesophageal reflux disease)    pepcid 2-3 times per week   Gout    Hemiparesis and alteration of sensations as late effects of stroke ([place]) 01/17/2015   History of melanoma    squamous cell, melanoma   Hyperlipidemia    a. statin intolerant, not felt to be candidate for PCSK9 due to chronically elevated CK levels.   Hypertension    Hypertensive heart disease    Hypothyroidism    Insomnia    Memory change 01/23/2014   Nasal polyposis    Osteoarthritis    Osteoporosis    Pneumonia    1990   Tobacco abuse     Assessment: 86 yo female, s/p fall and ORIF of ankle fracture. On Coumadin PTA for afib. Home dose 5mg  daily except 7.5 mg on Mondays. Pharmacy consulted to restart Coumadin without bridging therapy on post-op day 0.   INR today is subtherapeutic but up to 1.9. H/H mildly low yesterday. Plt wnl  Goal of Therapy:  INR 2-3 Monitor platelets by anticoagulation protocol: Yes   Plan:  Warfarin 5 mg po x 1 tonight F/u daily INR   Cristela Felt, PharmD, BCPS Clinical Pharmacist 11/08/2020 9:32 AM

## 2020-11-08 NOTE — Progress Notes (Signed)
SATURATION QUALIFICATIONS: (This note is used to comply with regulatory documentation for home oxygen)  Patient Saturations on Room Air at Rest = 98%  Patient Saturations on Room Air while Ambulating = 98%  Please briefly explain why patient needs home oxygen: Pt with no needs for supplemental O2 during ADLs and functional mobility.   [place], OTR/L Acute Rehab Pager: [phone] Office: [phone]

## 2020-11-08 NOTE — Care Management (Signed)
    Durable Medical Equipment  (From admission, onward)           Start     Ordered   11/08/20 0935  DME standard manual wheelchair with seat cushion  Once       Comments: Patient suffers from fall and fracture which impairs their ability to perform daily activities like bathing, dressing, and feeding in the home.  A crutch will not resolve issue with performing activities of daily living. A wheelchair will allow patient to safely perform daily activities. Patient can safely propel the wheelchair in the home or has a caregiver who can provide assistance. Length of need Lifetime. Accessories: elevating leg rests (ELRs), wheel locks, extensions and anti-tippers.  Seat and back cushions   11/08/20 0935   11/08/20 0929  DME 3-in-1  Once        11/08/20 0930

## 2020-11-12 ENCOUNTER — Telehealth: Payer: Self-pay | Admitting: *Deleted

## 2020-11-12 DIAGNOSIS — M80071D Age-related osteoporosis with current pathological fracture, right ankle and foot, subsequent encounter for fracture with routine healing: Secondary | ICD-10-CM | POA: Diagnosis not present

## 2020-11-12 DIAGNOSIS — J9621 Acute and chronic respiratory failure with hypoxia: Secondary | ICD-10-CM | POA: Diagnosis not present

## 2020-11-12 DIAGNOSIS — N183 Chronic kidney disease, stage 3 unspecified: Secondary | ICD-10-CM | POA: Diagnosis not present

## 2020-11-12 DIAGNOSIS — I25709 Atherosclerosis of coronary artery bypass graft(s), unspecified, with unspecified angina pectoris: Secondary | ICD-10-CM | POA: Diagnosis not present

## 2020-11-12 DIAGNOSIS — J45909 Unspecified asthma, uncomplicated: Secondary | ICD-10-CM | POA: Diagnosis not present

## 2020-11-12 DIAGNOSIS — I130 Hypertensive heart and chronic kidney disease with heart failure and stage 1 through stage 4 chronic kidney disease, or unspecified chronic kidney disease: Secondary | ICD-10-CM | POA: Diagnosis not present

## 2020-11-12 DIAGNOSIS — J439 Emphysema, unspecified: Secondary | ICD-10-CM | POA: Diagnosis not present

## 2020-11-12 DIAGNOSIS — I5022 Chronic systolic (congestive) heart failure: Secondary | ICD-10-CM | POA: Diagnosis not present

## 2020-11-12 DIAGNOSIS — I5033 Acute on chronic diastolic (congestive) heart failure: Secondary | ICD-10-CM | POA: Diagnosis not present

## 2020-11-12 NOTE — Telephone Encounter (Signed)
Received phone call from [facility] home health. Pt recently had surgery and was discharged with [facility]. [facility] will not be going to see patient. Gave order to [place], from [facility], to check pt's INR today and to call results to [phone].

## 2020-11-13 DIAGNOSIS — J45909 Unspecified asthma, uncomplicated: Secondary | ICD-10-CM | POA: Diagnosis not present

## 2020-11-13 DIAGNOSIS — I5022 Chronic systolic (congestive) heart failure: Secondary | ICD-10-CM | POA: Diagnosis not present

## 2020-11-13 DIAGNOSIS — J439 Emphysema, unspecified: Secondary | ICD-10-CM | POA: Diagnosis not present

## 2020-11-13 DIAGNOSIS — I130 Hypertensive heart and chronic kidney disease with heart failure and stage 1 through stage 4 chronic kidney disease, or unspecified chronic kidney disease: Secondary | ICD-10-CM | POA: Diagnosis not present

## 2020-11-13 DIAGNOSIS — I25709 Atherosclerosis of coronary artery bypass graft(s), unspecified, with unspecified angina pectoris: Secondary | ICD-10-CM | POA: Diagnosis not present

## 2020-11-13 DIAGNOSIS — M80071D Age-related osteoporosis with current pathological fracture, right ankle and foot, subsequent encounter for fracture with routine healing: Secondary | ICD-10-CM | POA: Diagnosis not present

## 2020-11-13 DIAGNOSIS — J9621 Acute and chronic respiratory failure with hypoxia: Secondary | ICD-10-CM | POA: Diagnosis not present

## 2020-11-13 DIAGNOSIS — S82851D Displaced trimalleolar fracture of right lower leg, subsequent encounter for closed fracture with routine healing: Secondary | ICD-10-CM | POA: Diagnosis not present

## 2020-11-13 DIAGNOSIS — I5033 Acute on chronic diastolic (congestive) heart failure: Secondary | ICD-10-CM | POA: Diagnosis not present

## 2020-11-13 DIAGNOSIS — S93431D Sprain of tibiofibular ligament of right ankle, subsequent encounter: Secondary | ICD-10-CM | POA: Diagnosis not present

## 2020-11-13 DIAGNOSIS — N183 Chronic kidney disease, stage 3 unspecified: Secondary | ICD-10-CM | POA: Diagnosis not present

## 2020-11-14 ENCOUNTER — Telehealth: Payer: Self-pay | Admitting: Interventional Cardiology

## 2020-11-14 NOTE — Telephone Encounter (Signed)
[place] needs our office to give approval to do in home PT-INR checks for this patient. The patient broke her ankle and she is not able to come to the office to get it checked.  The orders would be as follows: Twice weekly for 1 week Once weekly for two weeks Every other week for 6 weeks   Please reach out to [place] at [phone]) if further clarification is needed. That is her personal number and the office can leave a confidential voicemail if she can not answer

## 2020-11-14 NOTE — Telephone Encounter (Signed)
Called Amber [name] with [facility] to give approval to do in home PT-INR checks. No answer, left message for her to call back and discuss plan.

## 2020-11-14 NOTE — Telephone Encounter (Signed)
Spoke with Kenney Houseman, and approved that [facility] can do in home PT-INR checks. Kenney Houseman stated they would begin in home INR checks this week and follow what is instructed by the [facility] following.

## 2020-11-15 NOTE — Telephone Encounter (Signed)
  Estill Bamberg with [facility] called, she said they will get pt's INR tomorrow 10/07

## 2020-11-16 ENCOUNTER — Ambulatory Visit (INDEPENDENT_AMBULATORY_CARE_PROVIDER_SITE_OTHER): Payer: Medicare PPO

## 2020-11-16 DIAGNOSIS — J9621 Acute and chronic respiratory failure with hypoxia: Secondary | ICD-10-CM | POA: Diagnosis not present

## 2020-11-16 DIAGNOSIS — I25709 Atherosclerosis of coronary artery bypass graft(s), unspecified, with unspecified angina pectoris: Secondary | ICD-10-CM | POA: Diagnosis not present

## 2020-11-16 DIAGNOSIS — N183 Chronic kidney disease, stage 3 unspecified: Secondary | ICD-10-CM | POA: Diagnosis not present

## 2020-11-16 DIAGNOSIS — Z5181 Encounter for therapeutic drug level monitoring: Secondary | ICD-10-CM

## 2020-11-16 DIAGNOSIS — I482 Chronic atrial fibrillation, unspecified: Secondary | ICD-10-CM | POA: Diagnosis not present

## 2020-11-16 DIAGNOSIS — I5033 Acute on chronic diastolic (congestive) heart failure: Secondary | ICD-10-CM | POA: Diagnosis not present

## 2020-11-16 DIAGNOSIS — J45909 Unspecified asthma, uncomplicated: Secondary | ICD-10-CM | POA: Diagnosis not present

## 2020-11-16 DIAGNOSIS — J439 Emphysema, unspecified: Secondary | ICD-10-CM | POA: Diagnosis not present

## 2020-11-16 DIAGNOSIS — I5022 Chronic systolic (congestive) heart failure: Secondary | ICD-10-CM | POA: Diagnosis not present

## 2020-11-16 DIAGNOSIS — I130 Hypertensive heart and chronic kidney disease with heart failure and stage 1 through stage 4 chronic kidney disease, or unspecified chronic kidney disease: Secondary | ICD-10-CM | POA: Diagnosis not present

## 2020-11-16 DIAGNOSIS — M80071D Age-related osteoporosis with current pathological fracture, right ankle and foot, subsequent encounter for fracture with routine healing: Secondary | ICD-10-CM | POA: Diagnosis not present

## 2020-11-16 LAB — POCT INR: INR: 2.1 (ref 2.0–3.0)

## 2020-11-16 NOTE — Patient Instructions (Signed)
Description   Spoke with Pam RN with [facility] and instructed to have pt continue taking 1 tablet daily. Currently on Doxycycline 100mg  BID x 7 days, will complete on 11/20/20.  Recheck INR in 1 week by Home Health. [facility] Clinic [phone].

## 2020-11-19 ENCOUNTER — Other Ambulatory Visit: Payer: Self-pay | Admitting: Interventional Cardiology

## 2020-11-19 DIAGNOSIS — J45909 Unspecified asthma, uncomplicated: Secondary | ICD-10-CM | POA: Diagnosis not present

## 2020-11-19 DIAGNOSIS — J9621 Acute and chronic respiratory failure with hypoxia: Secondary | ICD-10-CM | POA: Diagnosis not present

## 2020-11-19 DIAGNOSIS — I5022 Chronic systolic (congestive) heart failure: Secondary | ICD-10-CM | POA: Diagnosis not present

## 2020-11-19 DIAGNOSIS — I5033 Acute on chronic diastolic (congestive) heart failure: Secondary | ICD-10-CM | POA: Diagnosis not present

## 2020-11-19 DIAGNOSIS — I25709 Atherosclerosis of coronary artery bypass graft(s), unspecified, with unspecified angina pectoris: Secondary | ICD-10-CM | POA: Diagnosis not present

## 2020-11-19 DIAGNOSIS — I130 Hypertensive heart and chronic kidney disease with heart failure and stage 1 through stage 4 chronic kidney disease, or unspecified chronic kidney disease: Secondary | ICD-10-CM | POA: Diagnosis not present

## 2020-11-19 DIAGNOSIS — N183 Chronic kidney disease, stage 3 unspecified: Secondary | ICD-10-CM | POA: Diagnosis not present

## 2020-11-19 DIAGNOSIS — J439 Emphysema, unspecified: Secondary | ICD-10-CM | POA: Diagnosis not present

## 2020-11-19 DIAGNOSIS — M80071D Age-related osteoporosis with current pathological fracture, right ankle and foot, subsequent encounter for fracture with routine healing: Secondary | ICD-10-CM | POA: Diagnosis not present

## 2020-11-21 DIAGNOSIS — I130 Hypertensive heart and chronic kidney disease with heart failure and stage 1 through stage 4 chronic kidney disease, or unspecified chronic kidney disease: Secondary | ICD-10-CM | POA: Diagnosis not present

## 2020-11-21 DIAGNOSIS — I5022 Chronic systolic (congestive) heart failure: Secondary | ICD-10-CM | POA: Diagnosis not present

## 2020-11-21 DIAGNOSIS — I25709 Atherosclerosis of coronary artery bypass graft(s), unspecified, with unspecified angina pectoris: Secondary | ICD-10-CM | POA: Diagnosis not present

## 2020-11-21 DIAGNOSIS — M80071D Age-related osteoporosis with current pathological fracture, right ankle and foot, subsequent encounter for fracture with routine healing: Secondary | ICD-10-CM | POA: Diagnosis not present

## 2020-11-21 DIAGNOSIS — J439 Emphysema, unspecified: Secondary | ICD-10-CM | POA: Diagnosis not present

## 2020-11-21 DIAGNOSIS — N183 Chronic kidney disease, stage 3 unspecified: Secondary | ICD-10-CM | POA: Diagnosis not present

## 2020-11-21 DIAGNOSIS — J45909 Unspecified asthma, uncomplicated: Secondary | ICD-10-CM | POA: Diagnosis not present

## 2020-11-21 DIAGNOSIS — I5033 Acute on chronic diastolic (congestive) heart failure: Secondary | ICD-10-CM | POA: Diagnosis not present

## 2020-11-21 DIAGNOSIS — J9621 Acute and chronic respiratory failure with hypoxia: Secondary | ICD-10-CM | POA: Diagnosis not present

## 2020-11-23 ENCOUNTER — Ambulatory Visit (INDEPENDENT_AMBULATORY_CARE_PROVIDER_SITE_OTHER): Payer: Medicare PPO | Admitting: *Deleted

## 2020-11-23 DIAGNOSIS — I482 Chronic atrial fibrillation, unspecified: Secondary | ICD-10-CM

## 2020-11-23 DIAGNOSIS — N183 Chronic kidney disease, stage 3 unspecified: Secondary | ICD-10-CM | POA: Diagnosis not present

## 2020-11-23 DIAGNOSIS — J439 Emphysema, unspecified: Secondary | ICD-10-CM | POA: Diagnosis not present

## 2020-11-23 DIAGNOSIS — I25709 Atherosclerosis of coronary artery bypass graft(s), unspecified, with unspecified angina pectoris: Secondary | ICD-10-CM | POA: Diagnosis not present

## 2020-11-23 DIAGNOSIS — J9621 Acute and chronic respiratory failure with hypoxia: Secondary | ICD-10-CM | POA: Diagnosis not present

## 2020-11-23 DIAGNOSIS — J45909 Unspecified asthma, uncomplicated: Secondary | ICD-10-CM | POA: Diagnosis not present

## 2020-11-23 DIAGNOSIS — Z5181 Encounter for therapeutic drug level monitoring: Secondary | ICD-10-CM

## 2020-11-23 DIAGNOSIS — I130 Hypertensive heart and chronic kidney disease with heart failure and stage 1 through stage 4 chronic kidney disease, or unspecified chronic kidney disease: Secondary | ICD-10-CM | POA: Diagnosis not present

## 2020-11-23 DIAGNOSIS — M80071D Age-related osteoporosis with current pathological fracture, right ankle and foot, subsequent encounter for fracture with routine healing: Secondary | ICD-10-CM | POA: Diagnosis not present

## 2020-11-23 DIAGNOSIS — I5022 Chronic systolic (congestive) heart failure: Secondary | ICD-10-CM | POA: Diagnosis not present

## 2020-11-23 DIAGNOSIS — I5033 Acute on chronic diastolic (congestive) heart failure: Secondary | ICD-10-CM | POA: Diagnosis not present

## 2020-11-23 LAB — POCT INR: INR: 2.7 (ref 2.0–3.0)

## 2020-11-23 NOTE — Patient Instructions (Signed)
Description   Spoke with Che RN with [facility] and instructed to have pt continue taking 1 tablet daily. Recheck INR in 1 week by Home Health (d/c plan for November 2022). [facility] Clinic [phone].

## 2020-11-26 DIAGNOSIS — I130 Hypertensive heart and chronic kidney disease with heart failure and stage 1 through stage 4 chronic kidney disease, or unspecified chronic kidney disease: Secondary | ICD-10-CM | POA: Diagnosis not present

## 2020-11-26 DIAGNOSIS — J9621 Acute and chronic respiratory failure with hypoxia: Secondary | ICD-10-CM | POA: Diagnosis not present

## 2020-11-26 DIAGNOSIS — I25709 Atherosclerosis of coronary artery bypass graft(s), unspecified, with unspecified angina pectoris: Secondary | ICD-10-CM | POA: Diagnosis not present

## 2020-11-26 DIAGNOSIS — M80071D Age-related osteoporosis with current pathological fracture, right ankle and foot, subsequent encounter for fracture with routine healing: Secondary | ICD-10-CM | POA: Diagnosis not present

## 2020-11-26 DIAGNOSIS — I5033 Acute on chronic diastolic (congestive) heart failure: Secondary | ICD-10-CM | POA: Diagnosis not present

## 2020-11-26 DIAGNOSIS — J439 Emphysema, unspecified: Secondary | ICD-10-CM | POA: Diagnosis not present

## 2020-11-26 DIAGNOSIS — J45909 Unspecified asthma, uncomplicated: Secondary | ICD-10-CM | POA: Diagnosis not present

## 2020-11-26 DIAGNOSIS — N183 Chronic kidney disease, stage 3 unspecified: Secondary | ICD-10-CM | POA: Diagnosis not present

## 2020-11-26 DIAGNOSIS — I5022 Chronic systolic (congestive) heart failure: Secondary | ICD-10-CM | POA: Diagnosis not present

## 2020-11-28 DIAGNOSIS — I5033 Acute on chronic diastolic (congestive) heart failure: Secondary | ICD-10-CM | POA: Diagnosis not present

## 2020-11-28 DIAGNOSIS — R0981 Nasal congestion: Secondary | ICD-10-CM | POA: Diagnosis not present

## 2020-11-28 DIAGNOSIS — R051 Acute cough: Secondary | ICD-10-CM | POA: Diagnosis not present

## 2020-11-28 DIAGNOSIS — J45909 Unspecified asthma, uncomplicated: Secondary | ICD-10-CM | POA: Diagnosis not present

## 2020-11-28 DIAGNOSIS — N183 Chronic kidney disease, stage 3 unspecified: Secondary | ICD-10-CM | POA: Diagnosis not present

## 2020-11-28 DIAGNOSIS — I25709 Atherosclerosis of coronary artery bypass graft(s), unspecified, with unspecified angina pectoris: Secondary | ICD-10-CM | POA: Diagnosis not present

## 2020-11-28 DIAGNOSIS — I130 Hypertensive heart and chronic kidney disease with heart failure and stage 1 through stage 4 chronic kidney disease, or unspecified chronic kidney disease: Secondary | ICD-10-CM | POA: Diagnosis not present

## 2020-11-28 DIAGNOSIS — J439 Emphysema, unspecified: Secondary | ICD-10-CM | POA: Diagnosis not present

## 2020-11-28 DIAGNOSIS — Z03818 Encounter for observation for suspected exposure to other biological agents ruled out: Secondary | ICD-10-CM | POA: Diagnosis not present

## 2020-11-28 DIAGNOSIS — J9621 Acute and chronic respiratory failure with hypoxia: Secondary | ICD-10-CM | POA: Diagnosis not present

## 2020-11-28 DIAGNOSIS — M80071D Age-related osteoporosis with current pathological fracture, right ankle and foot, subsequent encounter for fracture with routine healing: Secondary | ICD-10-CM | POA: Diagnosis not present

## 2020-11-28 DIAGNOSIS — I5022 Chronic systolic (congestive) heart failure: Secondary | ICD-10-CM | POA: Diagnosis not present

## 2020-11-30 ENCOUNTER — Ambulatory Visit (INDEPENDENT_AMBULATORY_CARE_PROVIDER_SITE_OTHER): Payer: Medicare PPO | Admitting: Internal Medicine

## 2020-11-30 DIAGNOSIS — M80071D Age-related osteoporosis with current pathological fracture, right ankle and foot, subsequent encounter for fracture with routine healing: Secondary | ICD-10-CM | POA: Diagnosis not present

## 2020-11-30 DIAGNOSIS — J439 Emphysema, unspecified: Secondary | ICD-10-CM | POA: Diagnosis not present

## 2020-11-30 DIAGNOSIS — J9621 Acute and chronic respiratory failure with hypoxia: Secondary | ICD-10-CM | POA: Diagnosis not present

## 2020-11-30 DIAGNOSIS — I5022 Chronic systolic (congestive) heart failure: Secondary | ICD-10-CM | POA: Diagnosis not present

## 2020-11-30 DIAGNOSIS — I130 Hypertensive heart and chronic kidney disease with heart failure and stage 1 through stage 4 chronic kidney disease, or unspecified chronic kidney disease: Secondary | ICD-10-CM | POA: Diagnosis not present

## 2020-11-30 DIAGNOSIS — N183 Chronic kidney disease, stage 3 unspecified: Secondary | ICD-10-CM | POA: Diagnosis not present

## 2020-11-30 DIAGNOSIS — I25709 Atherosclerosis of coronary artery bypass graft(s), unspecified, with unspecified angina pectoris: Secondary | ICD-10-CM | POA: Diagnosis not present

## 2020-11-30 DIAGNOSIS — I5033 Acute on chronic diastolic (congestive) heart failure: Secondary | ICD-10-CM | POA: Diagnosis not present

## 2020-11-30 DIAGNOSIS — Z5181 Encounter for therapeutic drug level monitoring: Secondary | ICD-10-CM

## 2020-11-30 DIAGNOSIS — J45909 Unspecified asthma, uncomplicated: Secondary | ICD-10-CM | POA: Diagnosis not present

## 2020-11-30 LAB — POCT INR: INR: 1.8 — AB (ref 2.0–3.0)

## 2020-11-30 NOTE — Patient Instructions (Signed)
Description   Spoke with Che RN with [facility] and instructed to have pt to take warfarin 1.5 tablets today and then continue taking warfarin 1 tablet daily. Recheck INR in 1 week by Home Health (d/c plan for November 2022). [facility] [phone].  Pt started doxy on 10/18

## 2020-12-03 DIAGNOSIS — N183 Chronic kidney disease, stage 3 unspecified: Secondary | ICD-10-CM | POA: Diagnosis not present

## 2020-12-03 DIAGNOSIS — J45909 Unspecified asthma, uncomplicated: Secondary | ICD-10-CM | POA: Diagnosis not present

## 2020-12-03 DIAGNOSIS — J439 Emphysema, unspecified: Secondary | ICD-10-CM | POA: Diagnosis not present

## 2020-12-03 DIAGNOSIS — J9621 Acute and chronic respiratory failure with hypoxia: Secondary | ICD-10-CM | POA: Diagnosis not present

## 2020-12-03 DIAGNOSIS — I130 Hypertensive heart and chronic kidney disease with heart failure and stage 1 through stage 4 chronic kidney disease, or unspecified chronic kidney disease: Secondary | ICD-10-CM | POA: Diagnosis not present

## 2020-12-03 DIAGNOSIS — I5033 Acute on chronic diastolic (congestive) heart failure: Secondary | ICD-10-CM | POA: Diagnosis not present

## 2020-12-03 DIAGNOSIS — I5022 Chronic systolic (congestive) heart failure: Secondary | ICD-10-CM | POA: Diagnosis not present

## 2020-12-03 DIAGNOSIS — I25709 Atherosclerosis of coronary artery bypass graft(s), unspecified, with unspecified angina pectoris: Secondary | ICD-10-CM | POA: Diagnosis not present

## 2020-12-03 DIAGNOSIS — M80071D Age-related osteoporosis with current pathological fracture, right ankle and foot, subsequent encounter for fracture with routine healing: Secondary | ICD-10-CM | POA: Diagnosis not present

## 2020-12-04 ENCOUNTER — Ambulatory Visit
Admission: RE | Admit: 2020-12-04 | Discharge: 2020-12-04 | Disposition: A | Discharge status: Home / Self Care | Payer: Medicare PPO | Source: Ambulatory Visit | Attending: Family Medicine | Admitting: Family Medicine

## 2020-12-04 ENCOUNTER — Other Ambulatory Visit: Payer: Self-pay | Admitting: Family Medicine

## 2020-12-04 ENCOUNTER — Other Ambulatory Visit: Payer: Self-pay

## 2020-12-04 DIAGNOSIS — S93431D Sprain of tibiofibular ligament of right ankle, subsequent encounter: Secondary | ICD-10-CM | POA: Diagnosis not present

## 2020-12-04 DIAGNOSIS — I5032 Chronic diastolic (congestive) heart failure: Secondary | ICD-10-CM

## 2020-12-04 DIAGNOSIS — Z951 Presence of aortocoronary bypass graft: Secondary | ICD-10-CM | POA: Diagnosis not present

## 2020-12-04 DIAGNOSIS — I700 Atherosclerosis of aorta: Secondary | ICD-10-CM | POA: Diagnosis not present

## 2020-12-04 DIAGNOSIS — S82851D Displaced trimalleolar fracture of right lower leg, subsequent encounter for closed fracture with routine healing: Secondary | ICD-10-CM | POA: Diagnosis not present

## 2020-12-04 DIAGNOSIS — I517 Cardiomegaly: Secondary | ICD-10-CM | POA: Diagnosis not present

## 2020-12-05 ENCOUNTER — Ambulatory Visit (INDEPENDENT_AMBULATORY_CARE_PROVIDER_SITE_OTHER): Payer: Medicare PPO

## 2020-12-05 DIAGNOSIS — Z5181 Encounter for therapeutic drug level monitoring: Secondary | ICD-10-CM | POA: Diagnosis not present

## 2020-12-05 LAB — POCT INR: INR: 2.4 (ref 2.0–3.0)

## 2020-12-05 NOTE — Patient Instructions (Signed)
Description   Spoke with Che RN with [facility] and instructed to have pt continue taking warfarin 1 tablet daily. Recheck INR in 1 week by Home Health. [facility] Clinic [phone].  Pt started doxy on 10/18

## 2020-12-07 ENCOUNTER — Telehealth: Payer: Self-pay | Admitting: Interventional Cardiology

## 2020-12-07 NOTE — Telephone Encounter (Signed)
Patient needed an appt asap to see cardiologist for Chronic diastolic heart failure ([place]). Scheduled patient to see Dr. Angelena Form on 12/10/20 @ 9:30 am on DOD.

## 2020-12-07 NOTE — Telephone Encounter (Signed)
Called pt daughter [name] ok per DPR in regards to urgent appointment.  She reports pt has been congested for a while.  Pt has had a productive cough she reports that contents were light green but she has not seen contents lately.  Pt also has a runny nose.  Pt had a CXR on 12/04/20 IMPRESSION: 1. No radiographic evidence of acute cardiopulmonary disease. 2. Mild cardiomegaly. 3. Aortic atherosclerosis Daughter states pt weighs daily but does not have actual weights.  She also reports pt BP is normal with no actual readings.  Pt has no difficulty breathing.  Daughter reports that pt has a noticeable rattle mainly at night.   Pt is often given an extra dose of furosemide 80 mg PO QD with little improvement.  Pt is scheduled to see DOD on 12/10/20 at 9:30 am.

## 2020-12-07 NOTE — Telephone Encounter (Signed)
Received phone call from patient's daughter stating that patient needs STAT appt due to patient having heart failure. Scheduled patient to see DOD on 12/10/20 but should of sent to triage first.

## 2020-12-10 ENCOUNTER — Ambulatory Visit: Payer: Medicare PPO | Admitting: Cardiovascular Disease

## 2020-12-10 ENCOUNTER — Other Ambulatory Visit: Payer: Self-pay

## 2020-12-10 ENCOUNTER — Encounter: Payer: Self-pay | Admitting: Cardiovascular Disease

## 2020-12-10 VITALS — BP 110/60 | HR 84 | Ht 65.0 in | Wt 134.0 lb

## 2020-12-10 DIAGNOSIS — I5043 Acute on chronic combined systolic (congestive) and diastolic (congestive) heart failure: Secondary | ICD-10-CM | POA: Diagnosis not present

## 2020-12-10 DIAGNOSIS — I350 Nonrheumatic aortic (valve) stenosis: Secondary | ICD-10-CM | POA: Diagnosis not present

## 2020-12-10 LAB — CBC
Hematocrit: 42.7 % (ref 34.0–46.6)
Hemoglobin: 13.9 g/dL (ref 11.1–15.9)
MCH: 29.2 pg (ref 26.6–33.0)
MCHC: 32.6 g/dL (ref 31.5–35.7)
MCV: 90 fL (ref 79–97)
Platelets: 298 10*3/uL (ref 150–450)
RBC: 4.76 x10E6/uL (ref 3.77–5.28)
RDW: 14.2 % (ref 11.7–15.4)
WBC: 8.7 10*3/uL (ref 3.4–10.8)

## 2020-12-10 LAB — BASIC METABOLIC PANEL
BUN/Creatinine Ratio: 16 (ref 12–28)
BUN: 20 mg/dL (ref 8–27)
CO2: 24 mmol/L (ref 20–29)
Calcium: 9.6 mg/dL (ref 8.7–10.3)
Chloride: 100 mmol/L (ref 96–106)
Creatinine, Ser: 1.22 mg/dL — ABNORMAL HIGH (ref 0.57–1.00)
Glucose: 159 mg/dL — ABNORMAL HIGH (ref 70–99)
Potassium: 3.6 mmol/L (ref 3.5–5.2)
Sodium: 142 mmol/L (ref 134–144)
eGFR: 44 mL/min/{1.73_m2} — ABNORMAL LOW (ref >59)

## 2020-12-10 MED ORDER — FUROSEMIDE 80 MG PO TABS: ORAL_TABLET | ORAL | 3 refills | Status: DC

## 2020-12-10 NOTE — Patient Instructions (Addendum)
Medication Instructions:  Your physician has recommended you make the following change in your medication:  1.) lasix 80 mg --take 80 mg twice a day alternating with 80 mg once a day every other day  *If you need a refill on your cardiac medications before your next appointment, please call your pharmacy*   Lab Work: Today: cbc, bmet  If you have labs (blood work) drawn today and your tests are completely normal, you will receive your results only by: [place] (if you have MyChart) OR A paper copy in the mail If you have any lab test that is abnormal or we need to change your treatment, we will call you to review the results.   Testing/Procedures: None ordered  Follow-Up:  We will call you with your next appointment  Your next appointment:   4-6 week(s)  The format for your next appointment:   In Person  Provider:   Daneen Schick, MD   Other Instructions

## 2020-12-10 NOTE — Progress Notes (Signed)
[facility] Consult Note  Chief Complaint  Patient presents with   Follow-up    Dyspnea   History of Present Illness:86 yo female with history of depression, GERD, hypothyroidism, CAD s/p CABG, persistent atrial fibrillation on coumadin, chronic combined systolic and diastolic CHF, stage 3 CKD, prior tobacco abuse, COPD, prior CVA, hyperlipidemia, HTN and mild dementia who is here today for evaluation of dyspnea, volume overload. She is followed in our office by Dr. Tamala Julian. She is added onto my schedule today as the doctor of the day in the office after a phone call from her family last week.  She was admitted to [facility] in September 2022 with an ankle fracture and did well with surgery. Before her surgery she had an echo. Echo 11/02/20 with LVEF=50-55%. Severe MAC. This was read as moderate AS. She appears to have severe low flow, low gradient aortic stenosis. I have personally reviewed her echo images and the aortic valve leaflets are thickened and calcified with limited mobility. Mean gradient is 21.8 mmHg, peak gradient 37 mmHg, AVA 0.62 cm2, SVI 27, dimensionless index 0.25.   She called into the office last week c/o "congestion". No definite dyspnea and no LE edema. She increased her Lasix at home to 120 mg am and 40 mg pm. She is now feeling better. No LE edema. No dizziness or chest pain. Congestion is improved. No fevers or chills. She is still recovering from her recent ankle fracture. Her daughters are staying with her. She lives at home with her elderly husband. She has top dentures and no active issues with her bottom teeth. She is a retired Network engineer. She uses a walker at home even before the ankle fracture. Her left foot has foot drop post CVA 20 years ago and she wears a brace on this foot. Her daughter reports progressive memory loss but she is pleasant and oriented x 3 today.   Primary Care Physician: Antony Contras, MD   Past Medical History:  Diagnosis Date   Abnormality  of gait 08/02/2014   Cerebrovascular disease 05/21/2016   Cervical spinal stenosis    Chronic atrial fibrillation ([place])    a. chronic/rate controlled;  b. chronic coumadin.   Chronic combined systolic and diastolic CHF (congestive heart failure) (HCC)    CKD (chronic kidney disease), stage III (HCC)    COPD with emphysema (HCC)    PFT 05/02/10>>FEV1 1.35(62%), FEV1% 66, DLCO 75%   Coronary artery disease    a. s/p CABG;  b. Abnormal nuc 2015 - managed medically.   CVA (cerebral vascular accident) ([place])    left sided weakness   Depression    Fibromyalgia    GERD (gastroesophageal reflux disease)    pepcid 2-3 times per week   Gout    Hemiparesis and alteration of sensations as late effects of stroke ([place]) 01/17/2015   History of melanoma    squamous cell, melanoma   Hyperlipidemia    a. statin intolerant, not felt to be candidate for PCSK9 due to chronically elevated CK levels.   Hypertension    Hypertensive heart disease    Hypothyroidism    Insomnia    Memory change 01/23/2014   Nasal polyposis    Osteoarthritis    Osteoporosis    Pneumonia    1990   Tobacco abuse     Past Surgical History:  Procedure Laterality Date   ABDOMINAL HYSTERECTOMY     BREAST LUMPECTOMY  1980s   Benign lesion - right   carpel  tunnel     right   CORONARY ARTERY BYPASS GRAFT  2000   EYE SURGERY     bilateral cataracts   IR GENERIC HISTORICAL  03/31/2016   IR RADIOLOGIST EVAL & MGMT 03/31/2016 [facility]-INTERV RAD   ORIF ANKLE FRACTURE Right 11/05/2020   Procedure: OPEN REDUCTION INTERNAL FIXATION (ORIF) ANKLE FRACTURE;  Surgeon: Shona Needles, MD;  Location: [place];  Service: Orthopedics;  Laterality: Right;   RADIOLOGY WITH ANESTHESIA  12/24/2011   Procedure: RADIOLOGY WITH ANESTHESIA;  Surgeon: Medication Radiologist, MD;  Location: [place];  Service: Radiology;  Laterality: N/A;  Extra Cranial Vascular Stent   RADIOLOGY WITH ANESTHESIA N/A 07/19/2014   Procedure: ANGIOPLASTY;  Surgeon: Luanne Bras, MD;  Location: [place];  Service: Radiology;  Laterality: N/A;   RADIOLOGY WITH ANESTHESIA N/A 07/27/2014   Procedure: ANGIOPLASTY;  Surgeon: Luanne Bras, MD;  Location: [place];  Service: Radiology;  Laterality: N/A;   TONSILLECTOMY      Current Outpatient Medications  Medication Sig Dispense Refill   acetaminophen (TYLENOL) 325 MG tablet Take 2 tablets (650 mg total) by mouth every 6 (six) hours.     allopurinol (ZYLOPRIM) 300 MG tablet Take 300 mg by mouth at bedtime.      amLODipine (NORVASC) 5 MG tablet TAKE 1 TABLET BY MOUTH EVERY DAY 90 tablet 2   citalopram (CELEXA) 20 MG tablet TAKE 1 TABLET BY MOUTH TWICE A DAY 60 tablet 11   clopidogrel (PLAVIX) 75 MG tablet TAKE 1 TABLET (75 MG TOTAL) BY MOUTH DAILY. 90 tablet 3   diphenoxylate-atropine (LOMOTIL) 2.5-0.025 MG tablet Take 1 tablet by mouth every Monday, Wednesday, and Friday.     donepezil (ARICEPT) 5 MG tablet Take 1 tablet (5 mg total) by mouth daily. 90 tablet 3   ergocalciferol (VITAMIN D2) 50000 UNITS capsule Take 50,000 Units by mouth 2 (two) times a week. Tuesday and Friday     ipratropium-albuterol (DUONEB) 0.5-2.5 (3) MG/3ML SOLN Take 3 mLs by nebulization every 4 (four) hours as needed (Use at home). (Patient taking differently: Take 3 mLs by nebulization every 4 (four) hours as needed (wheezing/shortness of breath).) 360 mL 0   levothyroxine (SYNTHROID, LEVOTHROID) 25 MCG tablet Take 25 mcg by mouth every morning.      losartan (COZAAR) 25 MG tablet Take 1 tablet (25 mg total) by mouth daily. 90 tablet 3   memantine (NAMENDA) 10 MG tablet Take 1 tablet (10 mg total) by mouth 2 (two) times daily. 180 tablet 3   metoprolol succinate (TOPROL-XL) 25 MG 24 hr tablet TAKE 1 TABLET BY MOUTH EVERY DAY (Patient taking differently: Take 25 mg by mouth daily.) 90 tablet 3   nitroGLYCERIN (NITROSTAT) 0.4 MG SL tablet Place 1 tablet (0.4 mg total) under the tongue every 5 (five) minutes as needed for chest pain. 25 tablet 3    polyethylene glycol (MIRALAX / GLYCOLAX) 17 g packet Take 17 g by mouth daily as needed for mild constipation. 14 each 0   potassium chloride SA (KLOR-CON M20) 20 MEQ tablet TAKE 1 TABLET EVERY OTHER DAY, ALTERNATING WITH 2 TABLETS EVERY OTHER DAY (Patient taking differently: Take 20-40 mEq by mouth See admin instructions. Alternate 21mq daily and 468m daily) 135 tablet 2   traMADol (ULTRAM) 50 MG tablet Take 1 tablet (50 mg total) by mouth every 8 (eight) hours as needed for moderate pain or severe pain. 30 tablet 0   warfarin (COUMADIN) 5 MG tablet TAKE AS DIRECTED BY COUMADIN CLINIC (Patient  taking differently: Take 5-7.5 mg by mouth See admin instructions. 7.69m on Monday nights, and 548mevery other night of the week.) 100 tablet 1   ZINC-VITAMIN C PO Take 1 tablet by mouth every evening.     furosemide (LASIX) 80 MG tablet Take 80 mg by mouth twice a day alternating with 80 mg once daily every other day 135 tablet 3   No current facility-administered medications for this visit.    Allergies  Allergen Reactions   Ezetimibe Other (See Comments)    Myalgia   Welchol [Colesevelam Hcl] Other (See Comments)    Muscle aches   Adhesive [Tape] Itching, Rash and Other (See Comments)    Burning    Ceclor [Cefaclor] Other (See Comments)    Unknown allergic reaction   Elastic Bandages & [Zinc] Rash and Other (See Comments)    Turns red on the areas it touches   Latex Itching, Rash and Other (See Comments)    Burning    Lipitor [Atorvastatin Calcium] Other (See Comments)    Increased fibromyalgia pain   Mevacor [Lovastatin] Other (See Comments)    Increased fibromyalgia pain   Pravachol Other (See Comments)    Increased fibromyalgia pain   Vasotec Other (See Comments)    Unknown allergic reaction    Social History   Socioeconomic History   Marital status: Married    Spouse name: Not on file   Number of children: 2   Years of education: Not on file   Highest education level: Not  on file  Occupational History   Occupation: Retired    EmFish farm managerGUPsychologist, 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   Patient lives at home with husband   Patient drinks 8 oz caffeine occasionally.   Social Determinants of Health   Financial Resource Strain: Not on file  Food Insecurity: Not on file  Transportation Needs: Not on file  Physical Activity: Not on file  Stress: Not on file  Social Connections: Not on file  Intimate Partner Violence: Not on file    Family History  Problem Relation Age of Onset   Stroke Mother    CAD Father    Diabetes type II Brother    CAD Brother     Review of Systems:  As stated in the HPI and otherwise negative.   BP 110/60   Pulse 84   Ht _0  (1.651 m)   Wt 134 lb (60.8 kg)   SpO2 96%   BMI 22.30 kg/m   Physical Examination: General: Well developed, well nourished, NAD  HEENT: OP clear, mucus membranes moist  SKIN: warm, dry. No rashes. Neuro: No focal deficits  Musculoskeletal: Muscle strength 5/5 all ext  Psychiatric: Mood and affect normal  Neck: No JVD, no carotid bruits, no thyromegaly, no lymphadenopathy.  Lungs:Clear bilaterally, no wheezes, rhonci, crackles Cardiovascular: Regular rate and rhythm. Harsh systolic murmur.  Abdomen:Soft. Bowel sounds present. Non-tender.  Extremities: No lower extremity edema. Right ankle wound healing.   EKG:  EKG is ordered today. The ekg ordered today demonstrates Atrial fib, rate 84 bpm.   Echo 11/02/20:   1. Left ventricular ejection  fraction, by estimation, is 50 to 55%. The  left ventricle has low normal function. The left ventricle demonstrates  regional wall motion abnormalities (see scoring diagram/findings for  description). Left ventricular diastolic   function could not be evaluated. There is mild hypokinesis of the left  ventricular, basal-mid anteroseptal wall.   2. Right ventricular systolic function is normal. The right ventricular  size is normal. There is normal pulmonary artery systolic pressure.   3. Left atrial size was moderately dilated.   4. Right atrial size was mild to moderately dilated.   5. The mitral valve is grossly normal. Trivial mitral valve  regurgitation. Moderate to severe mitral annular calcification.   6. The aortic valve is calcified. There is moderate calcification of the  aortic valve. There is moderate thickening of the aortic valve. Aortic  valve regurgitation is not visualized. Moderate aortic valve stenosis.  Aortic valve mean gradient measures  21.8 mmHg. Aortic valve Vmax measures 3.04 m/s.   7. The inferior vena cava is dilated in size with >50% respiratory  variability, suggesting right atrial pressure of 8 mmHg.   Comparison(s): Prior images reviewed side by side. Changes from prior  study are noted. Wall motion abnormality at the basal anteroseptum is now  only mild, previously severe. EF improved.   FINDINGS   Left Ventricle: Left ventricular ejection fraction, by estimation, is 50  to 55%. The left ventricle has low normal function. The left ventricle  demonstrates regional wall motion abnormalities. Mild hypokinesis of the  left ventricular, basal-mid  anteroseptal wall. Definity contrast agent was given IV to delineate the  left ventricular endocardial borders. The left ventricular internal cavity  size was normal in size. There is no left ventricular hypertrophy. Left  ventricular diastolic function  could not be evaluated due to atrial fibrillation. Left  ventricular  diastolic function could not be evaluated.   Right Ventricle: The right ventricular size is normal. Right vetricular  wall thickness was not well visualized. Right ventricular systolic  function is normal. There is normal pulmonary artery systolic pressure.  The tricuspid regurgitant velocity is 1.85  m/s, and with an assumed right atrial pressure of 8 mmHg, the estimated  right ventricular systolic pressure is 87.6 mmHg.   Left Atrium: Left atrial size was moderately dilated.   Right Atrium: Right atrial size was mild to moderately dilated.   Pericardium: There is no evidence of pericardial effusion.   Mitral Valve: The mitral valve is grossly normal. Moderate to severe  mitral annular calcification. Trivial mitral valve regurgitation. MV peak  gradient, 13.1 mmHg. The mean mitral valve gradient is 3.5 mmHg.   Tricuspid Valve: The tricuspid valve is normal in structure. Tricuspid  valve regurgitation is mild . No evidence of tricuspid stenosis.   Aortic Valve: The aortic valve is calcified. There is moderate  calcification of the aortic valve. There is moderate thickening of the  aortic valve. Aortic valve regurgitation is not visualized. Moderate  aortic stenosis is present. Aortic valve mean  gradient measures 21.8 mmHg. Aortic valve peak gradient measures 37.0  mmHg. Aortic valve area, by VTI measures 0.62 cm.   Pulmonic Valve: The pulmonic valve was not well visualized. Pulmonic valve  regurgitation is not visualized.   Aorta: The aortic root, ascending aorta,  aortic arch and descending aorta  are all structurally normal, with no evidence of dilitation or  obstruction.   Venous: The inferior vena cava is dilated in size with greater than 50%  respiratory variability, suggesting right atrial pressure of 8 mmHg.   IAS/Shunts: The atrial septum is grossly normal.      LEFT VENTRICLE  PLAX 2D  LVIDd:         4.40 cm  LVIDs:         3.75 cm  LV PW:          1.00 cm  LV IVS:        0.95 cm  LVOT diam:     1.80 cm  LV SV:         46  LV SV Index:   27  LVOT Area:     2.54 cm     LV Volumes (MOD)  LV vol d, MOD A4C: 61.6 ml  LV vol s, MOD A4C: 25.1 ml  LV SV MOD A4C:     61.6 ml   RIGHT VENTRICLE            IVC  RV S prime:     9.46 cm/s  IVC diam: 2.30 cm  TAPSE (M-mode): 1.7 cm   LEFT ATRIUM             Index       RIGHT ATRIUM           Index  LA diam:        4.30 cm 2.52 cm/m  RA Area:     16.50 cm  LA Vol (A2C):   64.8 ml 37.99 ml/m RA Volume:   45.70 ml  26.80 ml/m  LA Vol (A4C):   42.5 ml 24.92 ml/m  LA Biplane Vol: 54.1 ml 31.72 ml/m   AORTIC VALVE  AV Area (Vmax):    0.76 cm  AV Area (Vmean):   0.70 cm  AV Area (VTI):     0.62 cm  AV Vmax:           304.20 cm/s  AV Vmean:          220.200 cm/s  AV VTI:            0.736 m  AV Peak Grad:      37.0 mmHg  AV Mean Grad:      21.8 mmHg  LVOT Vmax:         91.00 cm/s  LVOT Vmean:        60.800 cm/s  LVOT VTI:          0.181 m  LVOT/AV VTI ratio: 0.25     AORTA  Ao Root diam: 2.70 cm  Ao Asc diam:  3.00 cm   MITRAL VALVE                TRICUSPID VALVE  MV Area (PHT): 3.35 cm     TR Peak grad:   13.7 mmHg  MV Area VTI:   0.87 cm     TR Vmax:        185.00 cm/s  MV Peak grad:  13.1 mmHg  MV Mean grad:  3.5 mmHg     SHUNTS  MV Vmax:       1.81 m/s     Systemic VTI:  0.18 m  MV Vmean:      83.0 cm/s    Systemic Diam: 1.80 cm  MV Decel Time: 227 msec  MV  E velocity: 160.00 cm/s   Recent Labs: 09/06/2020: NT-Pro BNP 5,862 11/01/2020: B Natriuretic Peptide 233.2 11/07/2020: BUN 21; Creatinine, Ser 0.96; Hemoglobin 10.8; Platelets 196; Potassium 4.4; Sodium 135   Lipid Panel    Component Value Date/Time   CHOL 228 (H) 03/06/2017 0410   TRIG 34 03/06/2017 0410   HDL 62 03/06/2017 0410   CHOLHDL 3.7 03/06/2017 0410   VLDL 7 03/06/2017 0410   LDLCALC 159 (H) 03/06/2017 0410   LDLDIRECT 236.0 08/02/2014 1418     Wt Readings from Last 3 Encounters:  12/10/20  134 lb (60.8 kg)  11/01/20 141 lb 1.5 oz (64 kg)  10/05/20 140 lb (63.5 kg)     Other studies Reviewed: Additional studies/ records that were reviewed today include: Echo images, EKG, office notes Review of the above records demonstrates: Severe AS   Assessment and Plan:   1. Acute on chronic systolic and diastolic CHF: She is not overloaded on exam today. I will have her take Lasix 80 mg BID alternating with Lasix 80 mg daily. Check BMET and CBC today  2. Severe low flow/low gradient aortic stenosis: I have personally reviewed her echo images. She likely has severe, stage D2 low flow/low gradient aortic valve stenosis. The aortic valve is thickened, calcified with limited leaflet mobility. She would likely benefit from AVR. Given advanced age, she is not a good candidate for conventional AVR by surgical approach. She is a marginal candidate for TAVR given dementia, poor functional status, COPD and prior CABG.    I have reviewed the natural history of aortic stenosis with the patient and their family members  who are present today. We have discussed the limitations of medical therapy and the poor prognosis associated with symptomatic aortic stenosis. We have reviewed potential treatment options, including palliative medical therapy, conventional surgical aortic valve replacement, and transcatheter aortic valve replacement. We discussed treatment options in the context of the patient's specific comorbid medical conditions.   I will review her case with Dr. Tamala Julian her primary cardiologist. If he feels that we should move forward with planning for TAVR, I will have him set her up for a right and left heart cath.   Current medicines are reviewed at length with the patient today.  The patient does not have concerns regarding medicines.  The following changes have been made:  no change  Labs/ tests ordered today include:   Orders Placed This Encounter  Procedures   CBC   Basic Metabolic Panel  (BMET)   EKG 12-Lead      Disposition:   F/U with Dr. Tamala Julian in 4-6 weeks.    Signed, Lauree Chandler, MD 12/10/2020 10:29 AM    [facility] [address], [place], [place]  [address] Phone: [phone]; Fax: [phone]

## 2020-12-11 ENCOUNTER — Other Ambulatory Visit: Payer: Self-pay | Admitting: *Deleted

## 2020-12-11 MED ORDER — POTASSIUM CHLORIDE CRYS ER 20 MEQ PO TBCR: 40.0000 meq | EXTENDED_RELEASE_TABLET | Freq: Every day | ORAL | 3 refills | Status: AC

## 2020-12-12 ENCOUNTER — Telehealth: Payer: Self-pay | Admitting: *Deleted

## 2020-12-12 DIAGNOSIS — C44729 Squamous cell carcinoma of skin of left lower limb, including hip: Secondary | ICD-10-CM | POA: Diagnosis not present

## 2020-12-12 NOTE — Telephone Encounter (Signed)
[place] and spoke with Che since pt's INR was due on yesterday. Che stated the pt transitioned to [facility] on 12/05/20. Called [facility] and spoke with Aaron Edelman the nurse who states the pt had her INR checked on yesterday and it was 2.5; he states that he called to PCP since they gave the order and they are keeping the pt on the 5mg  daily and they plan to manage it. He states that they gave orders to recheck in 1 month and call to them. He is aware the pt was managed by [place] and since they-the PCP office is taken over, we will not step in since there is no need for two providers to manage.  Will discontinue anticoagulation track since [facility] and PCP, Dr. Moreen Fowler, are managing.

## 2020-12-26 ENCOUNTER — Telehealth: Payer: Self-pay | Admitting: Interventional Cardiology

## 2020-12-26 NOTE — Telephone Encounter (Signed)
Pt's Warfarin/INR now being managed by PCP office. Called pharmacy and instructed the Warfarin refill request be sent to PCP office.

## 2020-12-26 NOTE — Telephone Encounter (Signed)
*  STAT* If patient is at the pharmacy, call can be transferred to refill team.   1. Which medications need to be refilled? (please list name of each medication and dose if known)  warfarin (COUMADIN) 5 MG tablet  2. Which pharmacy/location (including street and city if local pharmacy) is medication to be sent to? [id number] [place], [place] - [address]  3. Do they need a 30 day or 90 day supply? 30 with refills

## 2020-12-31 ENCOUNTER — Telehealth: Payer: Self-pay | Admitting: Interventional Cardiology

## 2020-12-31 NOTE — Telephone Encounter (Addendum)
Pt's PCP is now monitoring INR and managing warfarin.  Spoke w/ Natasha Bence, hospice nurse and advised him of this.  He is audibly upset, stating that PCP referred him to [place].   Advised him that since pt's PCP is managing, they will need to refill her warfarin.  He states this is very frustrating, that he will call them again and hung up.

## 2020-12-31 NOTE — Telephone Encounter (Signed)
*  STAT* If patient is at the pharmacy, call can be transferred to refill team.   1. Which medications need to be refilled? (please list name of each medication and dose if known) warfarin (COUMADIN) 5 MG tablet  2. Which pharmacy/location (including street and city if local pharmacy) is medication to be sent to? [id number] [place], [place] - [address]  3. Do they need a 30 day or 90 day supply? 90 ds

## 2021-01-01 ENCOUNTER — Telehealth: Payer: Self-pay | Admitting: *Deleted

## 2021-01-01 NOTE — Telephone Encounter (Addendum)
Received a call from the pt's [place], stating that he is stuck cause the pt is followed by Dr. Tamala Julian and can't understand why the office can't refill his warfarin. Advised that on 12/12/2020, per documentation, [name] reported that the pt was being managed by her PCP and they were giving orders for the pt's Warfarin. He stated he did check her INR and that he reported the INR to PCP and was given orders as he did report. Explained to him that two providers do not manage one pt and that is how it all started because [name], reported this to our office. He stated he did not recall so reminded him of the entire phone conversation.   Also, he wanted to know how to get the pt back in with [place] and explained. He will need to call the PCP to update them and find out how much warfarin they have sent in, current dose, INR, etc.   Will call Aaron Edelman back to follow up. Will have him obtain an INR on the pt as well.   @ 208pm-Returned a call to [place], [facility] Nurse for an update on the pt, and he states that he is awaiting a call back since the Primary Care office is questioning whether or not the pt should be on Warfarin and Plavix. Explained to him the difference in the two meds and her diagnosis and those are cardiac meds. Advised the dtr needs to be contacted since this is a change as well and so she would be on the same page.   At 214pm, received a call back from [place] and he states the PCP office just called him back and the nurse (he could not recall her name), stated they (PCP office) will continue to manage her warfarin and check it monthly and call to PCP, he states she is taking 5mg  of warfarin daily and the PCP office will be responsible for her warfarin refills. Asked again if the dtr is okay with this and he stated yes. So the PCP will be managing the warfarin/dosing INR.

## 2021-01-01 NOTE — Telephone Encounter (Signed)
Received a call from the pt's dtr, [name], in reference to the pt having an INR checked. Advised that on 12/12/2020 we were instructed by Dorothy Spark, that the pt's INR was being managed by her PCP office. Advised the dtr that we don't have two providers managing INR levels and since on 12/12/20, the results have been called to her PCP office. Also, noted that they know the pt's warfarin dose and since we have not been managing we do not. She states she will call them and see what is going on since she thought the results were being called to [place]. Advised that on 12/12/20, Aaron Edelman with [facility] instructed [place] on this (please to phone message). Will await an update if any.

## 2021-01-06 NOTE — Progress Notes (Signed)
Cardiology Office Note:    Date:  01/07/2021   ID:  Amber [name], DOB Jun 20, 1934, MRN [id number]  PCP:  Antony Contras, MD  Cardiologist:  Sinclair Grooms, MD   Referring MD: Antony Contras, MD   Chief Complaint  Patient presents with   Congestive Heart Failure   Coronary Artery Disease   Cardiac Valve Problem    Aortic stenosis    History of Present Illness:    Amber [name] is a 86 y.o. female with a hx of depression, GERD, hypothyroidism, CAD s/p CABG, persistent atrial fibrillation on coumadin, chronic combined systolic and diastolic CHF, stage 3 CKD, prior tobacco abuse, COPD, prior CVA, hyperlipidemia, HTN and mild dementia who is here today for evaluation of dyspnea, volume overload, and aortic stenosis.  Breathing is somewhat better although she is chronically short of breath.  She is currently taking 120 mg of furosemide each morning.  No chest pain.  No episodes of syncope.  I have reviewed the note by Dr. Angelena Form from 12/10/2020.  He feels there is a good chance that the patient has paradoxical low flow low gradient severe aortic stenosis contributing to heart failure.  I had conversation with the patient and her daughter today as I have had with the patient and her other daughter who is a Marine scientist.  Despite discussing valve therapies as a means of improving heart failure at some finite risk, they prefer conservative medical management as opposed to aggressive invasive management.  Past Medical History:  Diagnosis Date   Abnormality of gait 08/02/2014   Cerebrovascular disease 05/21/2016   Cervical spinal stenosis    Chronic atrial fibrillation (HCC)    a. chronic/rate controlled;  b. chronic coumadin.   Chronic combined systolic and diastolic CHF (congestive heart failure) (HCC)    CKD (chronic kidney disease), stage III (HCC)    COPD with emphysema (HCC)    PFT 05/02/10>>FEV1 1.35(62%), FEV1% 66, DLCO 75%   Coronary artery disease    a. s/p CABG;  b. Abnormal nuc  2015 - managed medically.   CVA (cerebral vascular accident) ([place])    left sided weakness   Depression    Fibromyalgia    GERD (gastroesophageal reflux disease)    pepcid 2-3 times per week   Gout    Hemiparesis and alteration of sensations as late effects of stroke ([place]) 01/17/2015   History of melanoma    squamous cell, melanoma   Hyperlipidemia    a. statin intolerant, not felt to be candidate for PCSK9 due to chronically elevated CK levels.   Hypertension    Hypertensive heart disease    Hypothyroidism    Insomnia    Memory change 01/23/2014   Nasal polyposis    Osteoarthritis    Osteoporosis    Pneumonia    1990   Tobacco abuse     Past Surgical History:  Procedure Laterality Date   ABDOMINAL HYSTERECTOMY     BREAST LUMPECTOMY  1980s   Benign lesion - right   carpel tunnel     right   CORONARY ARTERY BYPASS GRAFT  2000   EYE SURGERY     bilateral cataracts   IR GENERIC HISTORICAL  03/31/2016   IR RADIOLOGIST EVAL & MGMT 03/31/2016 [facility]INTER[facility] RAD   ORIF ANKLE FRACTURE Right 11/05/2020   Procedure: OPEN REDUCTION INTERNAL FIXATION (ORIF) ANKLE FRACTURE;  Surgeon: Shona Needles, MD;  Location: [place];  Service: Orthopedics;  Laterality: Right;   RADIOLOGY WITH ANESTHESIA  12/24/2011  Procedure: RADIOLOGY WITH ANESTHESIA;  Surgeon: Medication Radiologist, MD;  Location: [place];  Service: Radiology;  Laterality: N/A;  Extra Cranial Vascular Stent   RADIOLOGY WITH ANESTHESIA N/A 07/19/2014   Procedure: ANGIOPLASTY;  Surgeon: Luanne Bras, MD;  Location: [place];  Service: Radiology;  Laterality: N/A;   RADIOLOGY WITH ANESTHESIA N/A 07/27/2014   Procedure: ANGIOPLASTY;  Surgeon: Luanne Bras, MD;  Location: [place];  Service: Radiology;  Laterality: N/A;   TONSILLECTOMY      Current Medications: Current Meds  Medication Sig   acetaminophen (TYLENOL) 325 MG tablet Take 2 tablets (650 mg total) by mouth every 6 (six) hours.   allopurinol (ZYLOPRIM) 300 MG tablet Take  300 mg by mouth at bedtime.    amLODipine (NORVASC) 5 MG tablet TAKE 1 TABLET BY MOUTH EVERY DAY   citalopram (CELEXA) 20 MG tablet TAKE 1 TABLET BY MOUTH TWICE A DAY   clopidogrel (PLAVIX) 75 MG tablet TAKE 1 TABLET (75 MG TOTAL) BY MOUTH DAILY.   diphenoxylate-atropine (LOMOTIL) 2.5-0.025 MG tablet Take 1 tablet by mouth every Monday, Wednesday, and Friday.   donepezil (ARICEPT) 5 MG tablet Take 1 tablet (5 mg total) by mouth daily.   ergocalciferol (VITAMIN D2) 50000 UNITS capsule Take 50,000 Units by mouth 2 (two) times a week. Tuesday and Friday   furosemide (LASIX) 80 MG tablet Take 80 mg by mouth twice a day alternating with 80 mg once daily every other day   ipratropium-albuterol (DUONEB) 0.5-2.5 (3) MG/3ML SOLN Take 3 mLs by nebulization every 4 (four) hours as needed (Use at home). (Patient taking differently: Take 3 mLs by nebulization every 4 (four) hours as needed (wheezing/shortness of breath).)   levothyroxine (SYNTHROID, LEVOTHROID) 25 MCG tablet Take 25 mcg by mouth every morning.    losartan (COZAAR) 25 MG tablet Take 1 tablet (25 mg total) by mouth daily.   memantine (NAMENDA) 10 MG tablet Take 1 tablet (10 mg total) by mouth 2 (two) times daily.   metoprolol succinate (TOPROL-XL) 25 MG 24 hr tablet TAKE 1 TABLET BY MOUTH EVERY DAY (Patient taking differently: Take 25 mg by mouth daily.)   nitroGLYCERIN (NITROSTAT) 0.4 MG SL tablet Place 1 tablet (0.4 mg total) under the tongue every 5 (five) minutes as needed for chest pain.   polyethylene glycol (MIRALAX / GLYCOLAX) 17 g packet Take 17 g by mouth daily as needed for mild constipation.   potassium chloride SA (KLOR-CON M20) 20 MEQ tablet Take 2 tablets (40 mEq total) by mouth daily.   warfarin (COUMADIN) 5 MG tablet TAKE AS DIRECTED BY COUMADIN CLINIC (Patient taking differently: Take 5-7.5 mg by mouth See admin instructions. 7.5mg  on Monday nights, and 5mg  every other night of the week.)   ZINC-VITAMIN C PO Take 1 tablet by  mouth every evening.     Allergies:   Ezetimibe, Welchol [colesevelam hcl], Adhesive [tape], Ceclor [cefaclor], Elastic bandages & [zinc], Latex, Lipitor [atorvastatin calcium], Mevacor [lovastatin], Pravachol, and Vasotec   Social History   Socioeconomic History   Marital status: Married    Spouse name: Not on file   Number of children: 2   Years of education: Not on file   Highest education level: Not on file  Occupational History   Occupation: Retired    Fish farm manager: Psychologist, sport and exercise [employer]   Occupation: Network engineer    Comment: Retired  Tobacco Use   Smoking status: Former    Packs/day: 0.40    Years: 25.00    Pack years: 10.00  Types: Cigarettes    Quit date: 02/10/1977    Years since quitting: 43.9   Smokeless tobacco: Never  Vaping Use   Vaping Use: Never used  Substance and Sexual Activity   Alcohol use: No   Drug use: No   Sexual activity: Not Currently  Other Topics Concern   Not on file  Social History Narrative   Patient is right handed.   Patient lives at home with husband   Patient drinks 8 oz caffeine occasionally.   Social Determinants of Health   Financial Resource Strain: Not on file  Food Insecurity: Not on file  Transportation Needs: Not on file  Physical Activity: Not on file  Stress: Not on file  Social Connections: Not on file     Family History: The patient's family history includes CAD in her brother and father; Diabetes type II in her brother; Stroke in her mother.  ROS:   Please see the history of present illness.    Here with one of her daughters.  We discussed consideration of aortic valve therapy and again the patient and daughter are against having invasive procedures performed because "of my advanced age".  It was explained to them that volume status is going to become increasingly difficult to control over time as the valve gets worse.  She also has moderate to severe mitral regurgitation.  Please see Dr. Camillia Herter note from 12/10/2020.   All other systems reviewed and are negative.  EKGs/Labs/Other Studies Reviewed:    The following studies were reviewed today:  ECHOCARDIOGRAM 11/02/2020 IMPRESSIONS    1. Left ventricular ejection fraction, by estimation, is 50 to 55%. The  left ventricle has low normal function. The left ventricle demonstrates  regional wall motion abnormalities (see scoring diagram/findings for  description). Left ventricular diastolic   function could not be evaluated. There is mild hypokinesis of the left  ventricular, basal-mid anteroseptal wall.   2. Right ventricular systolic function is normal. The right ventricular  size is normal. There is normal pulmonary artery systolic pressure.   3. Left atrial size was moderately dilated.   4. Right atrial size was mild to moderately dilated.   5. The mitral valve is grossly normal. Trivial mitral valve  regurgitation. Moderate to severe mitral annular calcification.   6. The aortic valve is calcified. There is moderate calcification of the  aortic valve. There is moderate thickening of the aortic valve. Aortic  valve regurgitation is not visualized. Moderate aortic valve stenosis.  Aortic valve mean gradient measures  21.8 mmHg. Aortic valve Vmax measures 3.04 m/s.   7. The inferior vena cava is dilated in size with >50% respiratory  variability, suggesting right atrial pressure of 8 mmHg.   Comparison(s): Prior images reviewed side by side. Changes from prior  study are noted. Wall motion abnormality at the basal anteroseptum is now  only mild, previously severe. EF improved.   EKG:  EKG performed on 12/10/2020 demonstrates atrial fibrillation with controlled rate and ST abnormality.  Recent Labs: 09/06/2020: NT-Pro BNP 5,862 11/01/2020: B Natriuretic Peptide 233.2 12/10/2020: BUN 20; Creatinine, Ser 1.22; Hemoglobin 13.9; Platelets 298; Potassium 3.6; Sodium 142  Recent Lipid Panel    Component Value Date/Time   CHOL 228 (H) 03/06/2017 0410    TRIG 34 03/06/2017 0410   HDL 62 03/06/2017 0410   CHOLHDL 3.7 03/06/2017 0410   VLDL 7 03/06/2017 0410   LDLCALC 159 (H) 03/06/2017 0410   LDLDIRECT 236.0 08/02/2014 1418    Physical Exam:  VS:  BP 124/82   Pulse 69   Ht 5\' 5"  (1.651 m)   Wt 138 lb 3.2 oz (62.7 kg)   SpO2 97%   BMI 23.00 kg/m     Wt Readings from Last 3 Encounters:  01/07/21 138 lb 3.2 oz (62.7 kg)  12/10/20 134 lb (60.8 kg)  11/01/20 141 lb 1.5 oz (64 kg)     GEN: Elderly. No acute distress HEENT: Normal NECK: No JVD. LYMPHATICS: No lymphadenopathy CARDIAC: 3/6 crescendo decrescendo systolic right upper sternal murmur. RRR no gallop, or edema. VASCULAR:  Normal Pulses. No bruits. RESPIRATORY:  Clear to auscultation without rales, wheezing or rhonchi  ABDOMEN: Soft, non-tender, non-distended, No pulsatile mass, MUSCULOSKELETAL: No deformity  SKIN: Warm and dry NEUROLOGIC: Has known dementia.  Oriented x2. PSYCHIATRIC:  Normal affect   ASSESSMENT:    1. Severe aortic stenosis   2. Chronic atrial fibrillation (HCC)   3. Chronic combined systolic and diastolic heart failure ([place])   4. Coronary artery disease involving coronary bypass graft of native heart with angina pectoris ([place])   5. Essential hypertension   6. Dyslipidemia    PLAN:    In order of problems listed above:  Probable severe low-flow low gradient paradoxical aortic stenosis per Dr. Angelena Form.  Discussed consideration of more aggressive valve therapy with possible TAVR.  Again patient and a second daughter revealed that they are comfortable with continued conservative care given her other risk profile including dementia. Controlled rate Volume status appears improved on 120 mg of furosemide per day.  We will check basic metabolic panel to determine if this dose is appropriate.  The patient has no evidence of volume overload today and therefore I am somewhat concerned that she may be relatively dry. She denies angina. Blood pressure  is well controlled. Did not discuss dyslipidemia   Basic metabolic panel today.  Diuretic adjustment if needed based upon kidney function.  Conservative management of possible severe aortic stenosis per family and patient (dementia).  This is been discussed now with the patient and both of her daughters.  The daughter who is not here today is a healthcare worker/nurse and directs much of her interaction with healthcare providers.   Medication Adjustments/Labs and Tests Ordered: Current medicines are reviewed at length with the patient today.  Concerns regarding medicines are outlined above.  No orders of the defined types were placed in this encounter.  No orders of the defined types were placed in this encounter.   There are no Patient Instructions on file for this visit.   Signed, Sinclair Grooms, MD  01/07/2021 12:25 PM    [facility]

## 2021-01-07 ENCOUNTER — Ambulatory Visit: Admitting: Interventional Cardiology

## 2021-01-07 ENCOUNTER — Other Ambulatory Visit: Payer: Self-pay

## 2021-01-07 ENCOUNTER — Encounter: Payer: Self-pay | Admitting: Interventional Cardiology

## 2021-01-07 VITALS — BP 124/82 | HR 69 | Ht 65.0 in | Wt 138.2 lb

## 2021-01-07 DIAGNOSIS — I5042 Chronic combined systolic (congestive) and diastolic (congestive) heart failure: Secondary | ICD-10-CM

## 2021-01-07 DIAGNOSIS — I482 Chronic atrial fibrillation, unspecified: Secondary | ICD-10-CM

## 2021-01-07 DIAGNOSIS — E785 Hyperlipidemia, unspecified: Secondary | ICD-10-CM

## 2021-01-07 DIAGNOSIS — I25709 Atherosclerosis of coronary artery bypass graft(s), unspecified, with unspecified angina pectoris: Secondary | ICD-10-CM | POA: Diagnosis not present

## 2021-01-07 DIAGNOSIS — I10 Essential (primary) hypertension: Secondary | ICD-10-CM

## 2021-01-07 DIAGNOSIS — I350 Nonrheumatic aortic (valve) stenosis: Secondary | ICD-10-CM

## 2021-01-07 NOTE — Patient Instructions (Signed)
Medication Instructions:  No changes *If you need a refill on your cardiac medications before your next appointment, please call your pharmacy*   Lab Work: Today: BMET  Please return justt prior to your next visit in 3 months for blood work Artist)  If you have labs (blood work) drawn today and your tests are completely normal, you will receive your results only by: [employer] (if you have MyChart) OR A paper copy in the mail If you have any lab test that is abnormal or we need to change your treatment, we will call you to review the results.   Testing/Procedures: none   Follow-Up: At [facility], you and your health needs are our priority.  As part of our continuing mission to provide you with exceptional heart care, we have created designated Provider Care Teams.  These Care Teams include your primary Cardiologist (physician) and Advanced Practice Providers (APPs -  Physician Assistants and Nurse Practitioners) who all work together to provide you with the care you need, when you need it.   Your next appointment:   3 month(s)  The format for your next appointment:   In Person  Provider:   Sinclair Grooms, MD or one of our Advanced Practice Providers  Other Instructions

## 2021-01-10 LAB — BASIC METABOLIC PANEL
BUN/Creatinine Ratio: 20 (ref 12–28)
BUN: 21 mg/dL (ref 8–27)
CO2: 21 mmol/L (ref 20–29)
Calcium: 9.7 mg/dL (ref 8.7–10.3)
Chloride: 105 mmol/L (ref 96–106)
Creatinine, Ser: 1.05 mg/dL — ABNORMAL HIGH (ref 0.57–1.00)
Glucose: 137 mg/dL — ABNORMAL HIGH (ref 70–99)
Potassium: 4.5 mmol/L (ref 3.5–5.2)
eGFR: 52 mL/min/{1.73_m2} — ABNORMAL LOW (ref >59)

## 2021-01-11 ENCOUNTER — Other Ambulatory Visit: Payer: Self-pay | Admitting: *Deleted

## 2021-01-11 DIAGNOSIS — I5042 Chronic combined systolic (congestive) and diastolic (congestive) heart failure: Secondary | ICD-10-CM

## 2021-02-01 ENCOUNTER — Other Ambulatory Visit: Payer: Self-pay | Admitting: Interventional Cardiology

## 2021-02-01 NOTE — Telephone Encounter (Signed)
Refill request received. Pt's PCP now manages Warfarin/INR. Medication refused and pharmacy called to make them aware that Warfarin refill requested should be sent to PCP.

## 2021-02-27 ENCOUNTER — Other Ambulatory Visit: Payer: Self-pay

## 2021-02-27 ENCOUNTER — Other Ambulatory Visit: Admitting: *Deleted

## 2021-02-27 DIAGNOSIS — I5042 Chronic combined systolic (congestive) and diastolic (congestive) heart failure: Secondary | ICD-10-CM

## 2021-02-27 LAB — BASIC METABOLIC PANEL
BUN/Creatinine Ratio: 17 (ref 12–28)
BUN: 18 mg/dL (ref 8–27)
CO2: 27 mmol/L (ref 20–29)
Calcium: 9.5 mg/dL (ref 8.7–10.3)
Chloride: 101 mmol/L (ref 96–106)
Creatinine, Ser: 1.05 mg/dL — ABNORMAL HIGH (ref 0.57–1.00)
Glucose: 144 mg/dL — ABNORMAL HIGH (ref 70–99)
Potassium: 3.9 mmol/L (ref 3.5–5.2)
Sodium: 141 mmol/L (ref 134–144)
eGFR: 52 mL/min/{1.73_m2} — ABNORMAL LOW (ref >59)

## 2021-03-05 ENCOUNTER — Other Ambulatory Visit (HOSPITAL_COMMUNITY): Payer: Self-pay

## 2021-03-05 MED ORDER — DOXYCYCLINE HYCLATE 100 MG PO TABS
100.0000 mg | ORAL_TABLET | Freq: Two times a day (BID) | ORAL | 0 refills | Status: AC
  Filled 2021-03-05: qty 20, 10d supply, fill #0

## 2021-03-19 ENCOUNTER — Ambulatory Visit: Payer: Self-pay | Admitting: Student

## 2021-03-19 NOTE — H&P (Signed)
[facility]) H&P  Patient ID: Amber [name] MRN: [id number] DOB/AGE: 1934-07-07 87 y.o.  Reason for Surgery: Wound drainage right ankle  HPI: Amber [name] is an 87 y.o. female with past medical history of hypertension, hyperlipidemia, CAD s/p CABG, chronic atrial fibrillation on Coumadin, CVA in 2018 with residual left-sided weakness, COPD (FEV1 66%) not on home O2, combined systolic/diastolic CHF for which she follows cardiology, dementia/depression presenting for surgery on right ankle.  Patient sustained right trimalleolar ankle fracture dislocation and subsequently underwent ORIF of the right ankle by Dr. Doreatha Martin on 11/05/2020.  Patient has intermittently had drainage from both the medial and lateral incisions since surgery.  Each time this occurs, drainage has resolved with oral antibiotics (doxycycline).  Most recently patient began having drainage from the lateral incision about 3 weeks ago.  Patient has had increased pain in the ankle. She has been on two courses of doxycycline and despite this, continues to have clear drainage from the wounds.  She presents today for hardware removal.  Past Medical History:  Diagnosis Date   Abnormality of gait 08/02/2014   Cerebrovascular disease 05/21/2016   Cervical spinal stenosis    Chronic atrial fibrillation (HCC)    a. chronic/rate controlled;  b. chronic coumadin.   Chronic combined systolic and diastolic CHF (congestive heart failure) (HCC)    CKD (chronic kidney disease), stage III (HCC)    COPD with emphysema (HCC)    PFT 05/02/10>>FEV1 1.35(62%), FEV1% 66, DLCO 75%   Coronary artery disease    a. s/p CABG;  b. Abnormal nuc 2015 - managed medically.   CVA (cerebral vascular accident) ([place])    left sided weakness   Depression    Fibromyalgia    GERD (gastroesophageal reflux disease)    pepcid 2-3 times per week   Gout    Hemiparesis and alteration of sensations as late effects of stroke ([place]) 01/17/2015   History  of melanoma    squamous cell, melanoma   Hyperlipidemia    a. statin intolerant, not felt to be candidate for PCSK9 due to chronically elevated CK levels.   Hypertension    Hypertensive heart disease    Hypothyroidism    Insomnia    Memory change 01/23/2014   Nasal polyposis    Osteoarthritis    Osteoporosis    Pneumonia    1990   Tobacco abuse     Past Surgical History:  Procedure Laterality Date   ABDOMINAL HYSTERECTOMY     BREAST LUMPECTOMY  1980s   Benign lesion - right   carpel tunnel     right   CORONARY ARTERY BYPASS GRAFT  2000   EYE SURGERY     bilateral cataracts   IR GENERIC HISTORICAL  03/31/2016   IR RADIOLOGIST EVAL & MGMT 03/31/2016 [facility] RAD   ORIF ANKLE FRACTURE Right 11/05/2020   Procedure: OPEN REDUCTION INTERNAL FIXATION (ORIF) ANKLE FRACTURE;  Surgeon: Shona Needles, MD;  Location: [place];  Service: Orthopedics;  Laterality: Right;   RADIOLOGY WITH ANESTHESIA  12/24/2011   Procedure: RADIOLOGY WITH ANESTHESIA;  Surgeon: Medication Radiologist, MD;  Location: [place];  Service: Radiology;  Laterality: N/A;  Extra Cranial Vascular Stent   RADIOLOGY WITH ANESTHESIA N/A 07/19/2014   Procedure: ANGIOPLASTY;  Surgeon: Luanne Bras, MD;  Location: [place];  Service: Radiology;  Laterality: N/A;   RADIOLOGY WITH ANESTHESIA N/A 07/27/2014   Procedure: ANGIOPLASTY;  Surgeon: Luanne Bras, MD;  Location: [place];  Service: Radiology;  Laterality: N/A;  TONSILLECTOMY      Family History  Problem Relation Age of Onset   Stroke Mother    CAD Father    Diabetes type II Brother    CAD Brother     Social History:  reports that she quit smoking about 44 years ago. Her smoking use included cigarettes. She has a 10.00 pack-year smoking history. She has never used smokeless tobacco. She reports that she does not drink alcohol and does not use drugs.  Allergies:  Allergies  Allergen Reactions   Ezetimibe Other (See Comments)    Myalgia   Welchol [Colesevelam  Hcl] Other (See Comments)    Muscle aches   Adhesive [Tape] Itching, Rash and Other (See Comments)    Burning    Ceclor [Cefaclor] Other (See Comments)    Unknown allergic reaction   Elastic Bandages & [Zinc] Rash and Other (See Comments)    Turns red on the areas it touches   Latex Itching, Rash and Other (See Comments)    Burning    Lipitor [Atorvastatin Calcium] Other (See Comments)    Increased fibromyalgia pain   Mevacor [Lovastatin] Other (See Comments)    Increased fibromyalgia pain   Pravachol Other (See Comments)    Increased fibromyalgia pain   Vasotec Other (See Comments)    Unknown allergic reaction    Medications: I have reviewed the patient's current medications. Prior to Admission:  No medications prior to admission.    ROS: Constitutional: No fever or chills Vision: No changes in vision ENT: No difficulty swallowing CV: No chest pain Pulm: No SOB or wheezing GI: No nausea or vomiting GU: No urgency or inability to hold urine Skin: + Wound right lateral ankle with clear drainage Neurologic: No numbness or tingling Psychiatric: No depression or anxiety Heme: No bruising Allergic: No reaction to medications or food   Exam: There were no vitals taken for this visit. General: NAD Orientation: Pleasantly demented.  Alert and oriented to person and place Mood and Affect: Patient pleasant and cooperative Gait: WNL Coordination and balance: WNL  RLE: Lateral wound with clear drainage. Some surrounding redness and fluctuance to the area. Medial wound stable currently.  Tenderness with palpation over the lateral ankle.  Tolerates gentle ankle range of motion.  Endorses sensation distally.  Neurovascularly intact  LLE: Skin without lesions. No tenderness to palpation. Full painless ROM, full strength in each muscle groups without evidence of instability.   Medical Decision Making: Data: Imaging: AP and lateral views right ankle shows healed trimalleolar ankle  fracture with medial and lateral fixation in place.   Labs: No results found. However, due to the size of the patient record, not all encounters were searched. Please check Results Review for a complete set of results.   Assessment/Plan: 87 year old female s/p ORIF right ankle 11/05/20 presenting with continued wound drainage   Patient has been on multiple rounds of antibiotics and continues to have wound drainage. The fracture has fully healed on x-rays. At this point, I would recommend proceeding with irrigation and debridement of the right ankle with removal of hardware.  Risk and benefit of procedure discussed with the patient and her daughter Amber [name]). Risks discussed included bleeding, infection, re-injury to right ankle.  damage to surrounding nerves and blood vessels, pain, ankle stiffness, DVT/PE, compartment syndrome, and even anesthesia complications.  Due to patient's baseline dementia, her daughter agrees to proceed with surgery.  Consent will be obtained.  We will plan to discharge patient home postoperatively.  Gwinda Passe PA-C [facility] [phone] (office) [email or web address]

## 2021-03-20 ENCOUNTER — Encounter (HOSPITAL_COMMUNITY): Payer: Self-pay | Admitting: Student

## 2021-03-20 ENCOUNTER — Other Ambulatory Visit: Payer: Self-pay

## 2021-03-20 NOTE — Progress Notes (Signed)
Interview done with the daughter Amber [name] due the pt's memory.  PCP - Dr. Moreen Fowler  Cardiologist - Dr. Tamala Julian, H.  EP- Denies  Endocrine- Denies  Pulm- Denies  Chest x-ray - 12/05/20 (E)  EKG - 12/10/20 (E)  Stress Test - 03/24/13 (E)  ECHO - 11/02/20 (E)  Cardiac Cath - Denies  AICD- na PM- na LOOP- na  Nerve Stimulator- Denies  Dialysis- Denies  Sleep Study - Denies CPAP - Denies  LABS- 02/27/21 (E): BMP 03/22/21: CBC, PT,PCR  ASA- Denies Coumadin- LD- 2/6 Plavix- LD- 2/6  ERAS- No  HA1C- Denies  Anesthesia- No  Amber [name] denies the pt having chest pain, sob, or fever during the pre-op phone call. All instructions explained to Amber [name], with a verbal understanding of the material including: as of today, surgery stop taking all Aspirin (unless instructed by your doctor) and Other Aspirin containing products, Vitamins, Fish oils, and Herbal medications. Also stop all NSAIDS i.e. Advil, Ibuprofen, Motrin, Aleve, Anaprox, Naproxen, BC, Goody Powders, and all Supplements.  Amber [name] also instructed to wear a mask and social distance if they go out. The opportunity to ask questions was provided.    Coronavirus Screening  Have you experienced the following symptoms:  Cough yes/no: No Fever (>100.41F)  yes/no: No Runny nose yes/no: No Sore throat yes/no: No Difficulty breathing/shortness of breath  yes/no: No  Have you or a family member traveled in the last 14 days and where? yes/no: No   If the patient indicates "YES" to the above questions, their PAT will be rescheduled to limit the exposure to others and, the surgeon will be notified. THE PATIENT WILL NEED TO BE ASYMPTOMATIC FOR 14 DAYS.   If the patient is not experiencing any of these symptoms, the PAT nurse will instruct them to NOT bring anyone with them to their appointment since they may have these symptoms or traveled as well.   Please remind your patients and families that hospital visitation restrictions are in effect  and the importance of the restrictions.

## 2021-03-21 ENCOUNTER — Encounter (HOSPITAL_COMMUNITY): Payer: Self-pay | Admitting: Student

## 2021-03-21 NOTE — Anesthesia Preprocedure Evaluation (Addendum)
Anesthesia Evaluation  Patient identified by MRN, date of birth, ID band Patient awake    Reviewed: Allergy & Precautions, NPO status , Patient's Chart, lab work & pertinent test results  History of Anesthesia Complications Negative for: history of anesthetic complications  Airway Mallampati: I  TM Distance: >3 FB Neck ROM: Full    Dental  (+) Edentulous Upper, Edentulous Lower   Pulmonary asthma , pneumonia, resolved, COPD,  COPD inhaler, former smoker,    Pulmonary exam normal        Cardiovascular hypertension, Pt. on medications and Pt. on home beta blockers + angina with exertion + CAD, + CABG and +CHF  + dysrhythmias Atrial Fibrillation + Valvular Problems/Murmurs AS  Rhythm:Irregular Rate:Normal  EKG 11/01/20 Atrial fibrillation  Echo 11/02/20 1. Left ventricular ejection fraction, by estimation, is 50 to 55%. The left ventricle has low normal function. The left ventricle demonstrates regional wall motion abnormalities (see scoring diagram/findings for description). Left ventricular diastolic function could not be evaluated. There is mild hypokinesis of the left ventricular, basal-mid anteroseptal wall.  2. Right ventricular systolic function is normal. The right ventricular size is normal. There is normal pulmonary artery systolic pressure.  3. Left atrial size was moderately dilated.  4. Right atrial size was mild to moderately dilated.  5. The mitral valve is grossly normal. Trivial mitral valve  regurgitation. Moderate to severe mitral annular calcification.  6. The aortic valve is calcified. There is moderate calcification of the aortic valve. There is moderate thickening of the aortic valve. Aortic valve regurgitation is not visualized. Moderate aortic valve stenosis. Aortic valve mean gradient measures 21.8 mmHg. Aortic valve Vmax measures 3.04 m/s.  7. The inferior vena cava is dilated in size with >50% respiratory  variability, suggesting right atrial pressure of 8 mmHg.    Neuro/Psych PSYCHIATRIC DISORDERS Depression Dementia Left sided weakness  Neuromuscular disease CVA, Residual Symptoms    GI/Hepatic Neg liver ROS, GERD  Medicated,  Endo/Other  Hypothyroidism Hyperlipidemia  Renal/GU Renal InsufficiencyRenal disease  negative genitourinary   Musculoskeletal  (+) Arthritis , Osteoarthritis,  Fibromyalgia -Bimalleolar right ankle Fx   Abdominal   Peds  Hematology Coumnadin Therapy last dose 5 days ago Plavix therapy- last dose yesterday   Anesthesia Other Findings   Reproductive/Obstetrics                            Anesthesia Physical  Anesthesia Plan  ASA: 3  Anesthesia Plan: Regional and MAC   Post-op Pain Management:    Induction: Intravenous  PONV Risk Score and Plan: 3 and Treatment may vary due to age or medical condition, Ondansetron and Propofol infusion  Airway Management Planned: Natural Airway and Simple Face Mask  Additional Equipment:   Intra-op Plan:   Post-operative Plan:   Informed Consent: I have reviewed the patients History and Physical, chart, labs and discussed the procedure including the risks, benefits and alternatives for the proposed anesthesia with the patient or authorized representative who has indicated his/her understanding and acceptance.     Dental advisory given and Consent reviewed with POA  Plan Discussed with: Anesthesiologist and CRNA  Anesthesia Plan Comments:        Anesthesia Quick Evaluation

## 2021-03-22 ENCOUNTER — Ambulatory Visit (HOSPITAL_COMMUNITY)
Admission: RE | Admit: 2021-03-22 | Discharge: 2021-03-22 | Disposition: A | Discharge status: Home / Self Care | Attending: Student | Admitting: Student

## 2021-03-22 ENCOUNTER — Ambulatory Visit (HOSPITAL_COMMUNITY): Admitting: General Practice

## 2021-03-22 ENCOUNTER — Other Ambulatory Visit: Payer: Self-pay

## 2021-03-22 ENCOUNTER — Ambulatory Visit (HOSPITAL_BASED_OUTPATIENT_CLINIC_OR_DEPARTMENT_OTHER): Admitting: General Practice

## 2021-03-22 ENCOUNTER — Encounter (HOSPITAL_COMMUNITY)
Admission: RE | Disposition: A | Discharge status: Home / Self Care | Payer: Self-pay | Source: Home / Self Care | Attending: Student

## 2021-03-22 ENCOUNTER — Encounter (HOSPITAL_COMMUNITY): Payer: Self-pay | Admitting: Student

## 2021-03-22 ENCOUNTER — Ambulatory Visit (HOSPITAL_COMMUNITY)

## 2021-03-22 DIAGNOSIS — M19071 Primary osteoarthritis, right ankle and foot: Secondary | ICD-10-CM | POA: Diagnosis not present

## 2021-03-22 DIAGNOSIS — I5042 Chronic combined systolic (congestive) and diastolic (congestive) heart failure: Secondary | ICD-10-CM | POA: Insufficient documentation

## 2021-03-22 DIAGNOSIS — Z419 Encounter for procedure for purposes other than remedying health state, unspecified: Secondary | ICD-10-CM

## 2021-03-22 DIAGNOSIS — I25119 Atherosclerotic heart disease of native coronary artery with unspecified angina pectoris: Secondary | ICD-10-CM | POA: Diagnosis not present

## 2021-03-22 DIAGNOSIS — Z472 Encounter for removal of internal fixation device: Secondary | ICD-10-CM

## 2021-03-22 DIAGNOSIS — Z79899 Other long term (current) drug therapy: Secondary | ICD-10-CM | POA: Diagnosis not present

## 2021-03-22 DIAGNOSIS — N183 Chronic kidney disease, stage 3 unspecified: Secondary | ICD-10-CM | POA: Insufficient documentation

## 2021-03-22 DIAGNOSIS — F0390 Unspecified dementia without behavioral disturbance: Secondary | ICD-10-CM | POA: Diagnosis not present

## 2021-03-22 DIAGNOSIS — Y831 Surgical operation with implant of artificial internal device as the cause of abnormal reaction of the patient, or of later complication, without mention of misadventure at the time of the procedure: Secondary | ICD-10-CM | POA: Insufficient documentation

## 2021-03-22 DIAGNOSIS — Z7901 Long term (current) use of anticoagulants: Secondary | ICD-10-CM | POA: Insufficient documentation

## 2021-03-22 DIAGNOSIS — I110 Hypertensive heart disease with heart failure: Secondary | ICD-10-CM | POA: Diagnosis not present

## 2021-03-22 DIAGNOSIS — I482 Chronic atrial fibrillation, unspecified: Secondary | ICD-10-CM | POA: Diagnosis not present

## 2021-03-22 DIAGNOSIS — T8469XA Infection and inflammatory reaction due to internal fixation device of other site, initial encounter: Secondary | ICD-10-CM | POA: Insufficient documentation

## 2021-03-22 DIAGNOSIS — Z87891 Personal history of nicotine dependence: Secondary | ICD-10-CM | POA: Diagnosis not present

## 2021-03-22 DIAGNOSIS — K219 Gastro-esophageal reflux disease without esophagitis: Secondary | ICD-10-CM | POA: Insufficient documentation

## 2021-03-22 DIAGNOSIS — I130 Hypertensive heart and chronic kidney disease with heart failure and stage 1 through stage 4 chronic kidney disease, or unspecified chronic kidney disease: Secondary | ICD-10-CM | POA: Insufficient documentation

## 2021-03-22 DIAGNOSIS — I69354 Hemiplegia and hemiparesis following cerebral infarction affecting left non-dominant side: Secondary | ICD-10-CM | POA: Insufficient documentation

## 2021-03-22 DIAGNOSIS — J439 Emphysema, unspecified: Secondary | ICD-10-CM | POA: Diagnosis not present

## 2021-03-22 DIAGNOSIS — I5040 Unspecified combined systolic (congestive) and diastolic (congestive) heart failure: Secondary | ICD-10-CM

## 2021-03-22 DIAGNOSIS — Z951 Presence of aortocoronary bypass graft: Secondary | ICD-10-CM | POA: Insufficient documentation

## 2021-03-22 HISTORY — PX: HARDWARE REMOVAL: SHX979

## 2021-03-22 LAB — CBC
HCT: 38.6 % (ref 36.0–46.0)
Hemoglobin: 12.3 g/dL (ref 12.0–15.0)
MCH: 30.2 pg (ref 26.0–34.0)
MCHC: 31.9 g/dL (ref 30.0–36.0)
MCV: 94.8 fL (ref 80.0–100.0)
Platelets: 188 10*3/uL (ref 150–400)
RBC: 4.07 MIL/uL (ref 3.87–5.11)
RDW: 15.5 % (ref 11.5–15.5)
WBC: 5.8 10*3/uL (ref 4.0–10.5)
nRBC: 0 % (ref 0.0–0.2)

## 2021-03-22 LAB — SURGICAL PCR SCREEN
MRSA, PCR: NEGATIVE
Staphylococcus aureus: NEGATIVE

## 2021-03-22 LAB — PROTIME-INR
INR: 1.3 — ABNORMAL HIGH (ref 0.8–1.2)
Prothrombin Time: 16.2 seconds — ABNORMAL HIGH (ref 11.4–15.2)

## 2021-03-22 SURGERY — REMOVAL, HARDWARE
Anesthesia: Monitor Anesthesia Care | Site: Ankle | Laterality: Right

## 2021-03-22 MED ORDER — ORAL CARE MOUTH RINSE: 15.0000 mL | Freq: Once | OROMUCOSAL | Status: AC

## 2021-03-22 MED ORDER — METOPROLOL SUCCINATE ER 25 MG PO TB24
ORAL_TABLET | ORAL | Status: AC
  Administered 2021-03-22: 25 mg via ORAL
  Filled 2021-03-22: qty 1

## 2021-03-22 MED ORDER — EPHEDRINE SULFATE-NACL 50-0.9 MG/10ML-% IV SOSY
PREFILLED_SYRINGE | INTRAVENOUS | Status: DC | PRN
  Administered 2021-03-22: 5 mg via INTRAVENOUS

## 2021-03-22 MED ORDER — VANCOMYCIN HCL 1000 MG IV SOLR
INTRAVENOUS | Status: AC
  Filled 2021-03-22: qty 20

## 2021-03-22 MED ORDER — PROPOFOL 1000 MG/100ML IV EMUL
INTRAVENOUS | Status: AC
  Filled 2021-03-22: qty 100

## 2021-03-22 MED ORDER — TRAMADOL HCL 50 MG PO TABS: 50.0000 mg | ORAL_TABLET | Freq: Three times a day (TID) | ORAL | 0 refills | Status: AC | PRN

## 2021-03-22 MED ORDER — BUPIVACAINE HCL (PF) 0.5 % IJ SOLN
INTRAMUSCULAR | Status: DC | PRN
Start: 2021-03-22 — End: 2021-03-22
  Administered 2021-03-22: 10 mL via PERINEURAL
  Administered 2021-03-22: 20 mL via PERINEURAL

## 2021-03-22 MED ORDER — FENTANYL CITRATE (PF) 250 MCG/5ML IJ SOLN
INTRAMUSCULAR | Status: AC
  Filled 2021-03-22: qty 5

## 2021-03-22 MED ORDER — ONDANSETRON HCL 4 MG/2ML IJ SOLN
INTRAMUSCULAR | Status: DC | PRN
  Administered 2021-03-22: 4 mg via INTRAVENOUS

## 2021-03-22 MED ORDER — PROPOFOL 500 MG/50ML IV EMUL
INTRAVENOUS | Status: DC | PRN
  Administered 2021-03-22: 80 ug/kg/min via INTRAVENOUS

## 2021-03-22 MED ORDER — CHLORHEXIDINE GLUCONATE 0.12 % MT SOLN
15.0000 mL | Freq: Once | OROMUCOSAL | Status: AC
  Administered 2021-03-22: 15 mL via OROMUCOSAL
  Filled 2021-03-22: qty 15

## 2021-03-22 MED ORDER — 0.9 % SODIUM CHLORIDE (POUR BTL) OPTIME
TOPICAL | Status: DC | PRN
  Administered 2021-03-22: 1000 mL

## 2021-03-22 MED ORDER — CELECOXIB 200 MG PO CAPS
200.0000 mg | ORAL_CAPSULE | Freq: Once | ORAL | Status: AC
  Administered 2021-03-22: 200 mg via ORAL
  Filled 2021-03-22: qty 1

## 2021-03-22 MED ORDER — ACETAMINOPHEN 500 MG PO TABS
1000.0000 mg | ORAL_TABLET | Freq: Once | ORAL | Status: AC
  Administered 2021-03-22: 1000 mg via ORAL
  Filled 2021-03-22: qty 2

## 2021-03-22 MED ORDER — FENTANYL CITRATE (PF) 250 MCG/5ML IJ SOLN
INTRAMUSCULAR | Status: DC | PRN
Start: 2021-03-22 — End: 2021-03-22
  Administered 2021-03-22: 50 ug via INTRAVENOUS

## 2021-03-22 MED ORDER — PROMETHAZINE HCL 25 MG/ML IJ SOLN: 6.2500 mg | INTRAMUSCULAR | Status: DC | PRN

## 2021-03-22 MED ORDER — DEXAMETHASONE SODIUM PHOSPHATE 10 MG/ML IJ SOLN
INTRAMUSCULAR | Status: DC | PRN
Start: 2021-03-22 — End: 2021-03-22
  Administered 2021-03-22: 5 mg

## 2021-03-22 MED ORDER — CLINDAMYCIN PHOSPHATE 900 MG/50ML IV SOLN
900.0000 mg | INTRAVENOUS | Status: AC
  Administered 2021-03-22: 900 mg via INTRAVENOUS
  Filled 2021-03-22: qty 50

## 2021-03-22 MED ORDER — PROPOFOL 10 MG/ML IV BOLUS
INTRAVENOUS | Status: AC
  Filled 2021-03-22: qty 20

## 2021-03-22 MED ORDER — LEVOFLOXACIN 500 MG PO TABS: 500.0000 mg | ORAL_TABLET | Freq: Every day | ORAL | 0 refills | Status: AC

## 2021-03-22 MED ORDER — METOPROLOL SUCCINATE ER 25 MG PO TB24: 25.0000 mg | ORAL_TABLET | Freq: Once | ORAL | Status: AC

## 2021-03-22 MED ORDER — LEVOFLOXACIN 500 MG PO TABS
750.0000 mg | ORAL_TABLET | Freq: Every day | ORAL | 0 refills | Status: DC
Start: 2021-03-22 — End: 2021-03-22

## 2021-03-22 MED ORDER — FENTANYL CITRATE (PF) 100 MCG/2ML IJ SOLN: 25.0000 ug | INTRAMUSCULAR | Status: DC | PRN

## 2021-03-22 MED ORDER — AMISULPRIDE (ANTIEMETIC) 5 MG/2ML IV SOLN: 10.0000 mg | Freq: Once | INTRAVENOUS | Status: DC | PRN

## 2021-03-22 MED ORDER — LIDOCAINE 2% (20 MG/ML) 5 ML SYRINGE
INTRAMUSCULAR | Status: AC
  Filled 2021-03-22: qty 5

## 2021-03-22 MED ORDER — ONDANSETRON HCL 4 MG/2ML IJ SOLN
INTRAMUSCULAR | Status: AC
  Filled 2021-03-22: qty 2

## 2021-03-22 MED ORDER — PHENYLEPHRINE HCL-NACL 20-0.9 MG/250ML-% IV SOLN
INTRAVENOUS | Status: DC | PRN
  Administered 2021-03-22: 25 ug/min via INTRAVENOUS

## 2021-03-22 MED ORDER — VANCOMYCIN HCL 1000 MG IV SOLR
INTRAVENOUS | Status: DC | PRN
  Administered 2021-03-22: 1000 mg

## 2021-03-22 MED ORDER — LACTATED RINGERS IV SOLN: INTRAVENOUS | Status: DC

## 2021-03-22 SURGICAL SUPPLY — 55 items
APL PRP STRL LF DISP 70% ISPRP (MISCELLANEOUS) ×1
BAG COUNTER SPONGE SURGICOUNT (BAG) ×3 IMPLANT
BAG SPNG CNTER NS LX DISP (BAG) ×1
BANDAGE ESMARK 6X9 LF (GAUZE/BANDAGES/DRESSINGS) ×2 IMPLANT
BNDG CMPR 9X6 STRL LF SNTH (GAUZE/BANDAGES/DRESSINGS) ×1
BNDG COHESIVE 6X5 TAN STRL LF (GAUZE/BANDAGES/DRESSINGS) ×3 IMPLANT
BNDG ELASTIC 4X5.8 VLCR STR LF (GAUZE/BANDAGES/DRESSINGS) ×1 IMPLANT
BNDG ELASTIC 6X5.8 VLCR STR LF (GAUZE/BANDAGES/DRESSINGS) ×1 IMPLANT
BNDG ESMARK 6X9 LF (GAUZE/BANDAGES/DRESSINGS) ×2
BRUSH SCRUB EZ PLAIN DRY (MISCELLANEOUS) ×6 IMPLANT
CHLORAPREP W/TINT 26 (MISCELLANEOUS) ×3 IMPLANT
COVER SURGICAL LIGHT HANDLE (MISCELLANEOUS) ×6 IMPLANT
CUFF TOURN SGL QUICK 18X4 (TOURNIQUET CUFF) IMPLANT
CUFF TOURN SGL QUICK 24 (TOURNIQUET CUFF)
CUFF TOURN SGL QUICK 34 (TOURNIQUET CUFF)
CUFF TRNQT CYL 24X4X16.5-23 (TOURNIQUET CUFF) IMPLANT
CUFF TRNQT CYL 34X4.125X (TOURNIQUET CUFF) IMPLANT
DRAPE C-ARM 42X72 X-RAY (DRAPES) IMPLANT
DRAPE C-ARMOR (DRAPES) ×3 IMPLANT
DRAPE U-SHAPE 47X51 STRL (DRAPES) ×3 IMPLANT
DRSG MEPITEL 4X7.2 (GAUZE/BANDAGES/DRESSINGS) ×1 IMPLANT
ELECT REM PT RETURN 9FT ADLT (ELECTROSURGICAL) ×2
ELECTRODE REM PT RTRN 9FT ADLT (ELECTROSURGICAL) ×2 IMPLANT
GAUZE SPONGE 4X4 12PLY STRL (GAUZE/BANDAGES/DRESSINGS) ×1 IMPLANT
GLOVE SURG ENC MOIS LTX SZ6.5 (GLOVE) ×9 IMPLANT
GLOVE SURG ENC MOIS LTX SZ7.5 (GLOVE) ×12 IMPLANT
GLOVE SURG UNDER POLY LF SZ6.5 (GLOVE) ×3 IMPLANT
GLOVE SURG UNDER POLY LF SZ7.5 (GLOVE) ×3 IMPLANT
GOWN STRL REUS W/ TWL LRG LVL3 (GOWN DISPOSABLE) ×4 IMPLANT
GOWN STRL REUS W/TWL LRG LVL3 (GOWN DISPOSABLE) ×4
KIT BASIN OR (CUSTOM PROCEDURE TRAY) ×3 IMPLANT
KIT TURNOVER KIT B (KITS) ×3 IMPLANT
MANIFOLD NEPTUNE II (INSTRUMENTS) ×3 IMPLANT
NS IRRIG 1000ML POUR BTL (IV SOLUTION) ×3 IMPLANT
PACK ORTHO EXTREMITY (CUSTOM PROCEDURE TRAY) ×3 IMPLANT
PAD ABD 8X10 STRL (GAUZE/BANDAGES/DRESSINGS) ×1 IMPLANT
PAD ARMBOARD 7.5X6 YLW CONV (MISCELLANEOUS) ×6 IMPLANT
PAD CAST 4YDX4 CTTN HI CHSV (CAST SUPPLIES) IMPLANT
PADDING CAST COTTON 4X4 STRL (CAST SUPPLIES) ×2
PADDING CAST COTTON 6X4 STRL (CAST SUPPLIES) ×1 IMPLANT
SPONGE T-LAP 18X18 ~~LOC~~+RFID (SPONGE) ×3 IMPLANT
STOCKINETTE IMPERVIOUS LG (DRAPES) ×3 IMPLANT
STRIP CLOSURE SKIN 1/2X4 (GAUZE/BANDAGES/DRESSINGS) IMPLANT
SUCTION FRAZIER HANDLE 10FR (MISCELLANEOUS)
SUCTION TUBE FRAZIER 10FR DISP (MISCELLANEOUS) IMPLANT
SUT ETHILON 3 0 PS 1 (SUTURE) ×2 IMPLANT
SUT PDS AB 2-0 CT1 27 (SUTURE) ×1 IMPLANT
SUT VIC AB 0 CT1 27 (SUTURE)
SUT VIC AB 0 CT1 27XBRD ANBCTR (SUTURE) IMPLANT
SYR CONTROL 10ML LL (SYRINGE) IMPLANT
TOWEL GREEN STERILE (TOWEL DISPOSABLE) ×6 IMPLANT
TOWEL GREEN STERILE FF (TOWEL DISPOSABLE) ×6 IMPLANT
TUBE CONNECTING 12X1/4 (SUCTIONS) ×3 IMPLANT
UNDERPAD 30X36 HEAVY ABSORB (UNDERPADS AND DIAPERS) ×3 IMPLANT
YANKAUER SUCT BULB TIP NO VENT (SUCTIONS) ×3 IMPLANT

## 2021-03-22 NOTE — Anesthesia Postprocedure Evaluation (Signed)
Anesthesia Post Note  Patient: Amber [name]  Procedure(s) Performed: HARDWARE REMOVAL RIGHT ANKLE (Right: Ankle)     Patient location during evaluation: PACU Anesthesia Type: Regional and MAC Level of consciousness: awake and alert Pain management: pain level controlled Vital Signs Assessment: post-procedure vital signs reviewed and stable Respiratory status: spontaneous breathing and respiratory function stable Cardiovascular status: stable Postop Assessment: no apparent nausea or vomiting Anesthetic complications: no   No notable events documented.  Last Vitals:  Vitals:   03/22/21 0845 03/22/21 0855  BP: (!) 137/45 (!) 118/49  Pulse: (!) 45 (!) 56  Resp: 14 18  Temp:  36.5 C  SpO2: 94% 96%    Last Pain:  Vitals:   03/22/21 0855  TempSrc:   PainSc: [place]

## 2021-03-22 NOTE — Interval H&P Note (Signed)
History and Physical Interval Note:  03/22/2021 7:21 AM  Amber [name]  has presented today for surgery, with the diagnosis of Right ankle infection.  The various methods of treatment have been discussed with the patient and family. After consideration of risks, benefits and other options for treatment, the patient has consented to  Procedure(s): HARDWARE REMOVAL RIGHT ANKLE (Right) as a surgical intervention.  The patient's history has been reviewed, patient examined, no change in status, stable for surgery.  I have reviewed the patient's chart and labs.  Questions were answered to the patient's satisfaction.     Lennette Bihari P Fernie Grimm

## 2021-03-22 NOTE — Discharge Instructions (Addendum)
[facility] Discharge Instructions   General Discharge Instructions  WEIGHT BEARING STATUS:weightbearing as tolerated  RANGE OF MOTION/ACTIVITY:Ok for unrestricted ankle range of motion as tolerated  Wound Care: You may keep your surgical dressing on until follow-up. If you would prefer to remove the dressing, you may do so on post-op day #3 (Monday 03/25/21).  Incisions can be left open to air if there is no drainage. If incision continues to have drainage, follow wound care instructions below. Okay to shower if no drainage from incisions.  DVT/PE prophylaxis: Coumadin and Plavix  Diet: as you were eating previously.  Can use over the counter stool softeners and bowel preparations, such as Miralax, to help with bowel movements.  Narcotics can be constipating.  Be sure to drink plenty of fluids  PAIN MEDICATION USE AND EXPECTATIONS  You have likely been given narcotic medications to help control your pain.  After a traumatic event that results in an fracture (broken bone) with or without surgery, it is ok to use narcotic pain medications to help control one's pain.  We understand that everyone responds to pain differently and each individual patient will be evaluated on a regular basis for the continued need for narcotic medications. Ideally, narcotic medication use should last no more than 6-8 weeks (coinciding with fracture healing).   As a patient it is your responsibility as well to monitor narcotic medication use and report the amount and frequency you use these medications when you come to your office visit.   We would also advise that if you are using narcotic medications, you should take a dose prior to therapy to maximize you participation.  IF YOU ARE ON NARCOTIC MEDICATIONS IT IS NOT PERMISSIBLE TO OPERATE A MOTOR VEHICLE (MOTORCYCLE/CAR/TRUCK/MOPED) OR HEAVY MACHINERY DO NOT MIX NARCOTICS WITH OTHER CNS (CENTRAL NERVOUS SYSTEM) DEPRESSANTS SUCH AS ALCOHOL   STOP  SMOKING OR USING NICOTINE PRODUCTS!!!!  As discussed nicotine severely impairs your body's ability to heal surgical and traumatic wounds but also impairs bone healing.  Wounds and bone heal by forming microscopic blood vessels (angiogenesis) and nicotine is a vasoconstrictor (essentially, shrinks blood vessels).  Therefore, if vasoconstriction occurs to these microscopic blood vessels they essentially disappear and are unable to deliver necessary nutrients to the healing tissue.  This is one modifiable factor that you can do to dramatically increase your chances of healing your injury.    (This means no smoking, no nicotine gum, patches, etc)  DO NOT USE NONSTEROIDAL ANTI-INFLAMMATORY DRUGS (NSAID'S)  Using products such as Advil (ibuprofen), Aleve (naproxen), Motrin (ibuprofen) for additional pain control during fracture healing can delay and/or prevent the healing response.  If you would like to take over the counter (OTC) medication, Tylenol (acetaminophen) is ok.  However, some narcotic medications that are given for pain control contain acetaminophen as well. Therefore, you should not exceed more than 4000 mg of tylenol in a day if you do not have liver disease.  Also note that there are may OTC medicines, such as cold medicines and allergy medicines that my contain tylenol as well.  If you have any questions about medications and/or interactions please ask your doctor/PA or your pharmacist.      ICE AND ELEVATE INJURED/OPERATIVE EXTREMITY  Using ice and elevating the injured extremity above your heart can help with swelling and pain control.  Icing in a pulsatile fashion, such as 20 minutes on and 20 minutes off, can be followed.    Do not place ice directly  on skin. Make sure there is a barrier between to skin and the ice pack.    Using frozen items such as frozen peas works well as the conform nicely to the are that needs to be iced.  USE AN ACE WRAP OR TED HOSE FOR SWELLING CONTROL  In  addition to icing and elevation, Ace wraps or TED hose are used to help limit and resolve swelling.  It is recommended to use Ace wraps or TED hose until you are informed to stop.    When using Ace Wraps start the wrapping distally (farthest away from the body) and wrap proximally (closer to the body)   Example: If you had surgery on your leg or thing and you do not have a splint on, start the ace wrap at the toes and work your way up to the thigh        If you had surgery on your upper extremity and do not have a splint on, start the ace wrap at your fingers and work your way up to the upper arm   [place]: [phone]   VISIT OUR WEBSITE FOR ADDITIONAL INFORMATION: [email or web address]     Discharge Wound Care Instructions  Do NOT apply any ointments, solutions or lotions to pin sites or surgical wounds.  These prevent needed drainage and even though solutions like hydrogen peroxide kill bacteria, they also damage cells lining the pin sites that help fight infection.  Applying lotions or ointments can keep the wounds moist and can cause them to breakdown and open up as well. This can increase the risk for infection. When in doubt call the office.  Surgical incisions should be dressed daily.  If any drainage is noted, use one layer of adaptic, then gauze, Kerlix, and an ace wrap.  Once the incision is completely dry and without drainage, it may be left open to air out.  Showering may begin 36-48 hours later.  Cleaning gently with soap and water.

## 2021-03-22 NOTE — Transfer of Care (Signed)
Immediate Anesthesia Transfer of Care Note  Patient: Amber [name]  Procedure(s) Performed: HARDWARE REMOVAL RIGHT ANKLE (Right: Ankle)  Patient Location: PACU  Anesthesia Type:MAC and Regional  Level of Consciousness: awake and drowsy  Airway & Oxygen Therapy: Patient Spontanous Breathing  Post-op Assessment: Report given to RN and Post -op Vital signs reviewed and stable  Post vital signs: Reviewed and stable  Last Vitals:  Vitals Value Taken Time  BP 143/56 03/22/21 0831  Temp    Pulse 60 03/22/21 0832  Resp 14 03/22/21 0832  SpO2 97 % 03/22/21 0832  Vitals shown include unvalidated device data.  Last Pain:  Vitals:   03/22/21 0617  TempSrc:   PainSc: 0-No pain         Complications: No notable events documented.

## 2021-03-22 NOTE — Op Note (Signed)
Orthopaedic Surgery Operative Note (CSN: [id number] ) Date of Surgery: 03/22/2021  Admit Date: 03/22/2021   Diagnoses: Pre-Op Diagnoses: Right ankle fracture Draining wound right ankle  Post-Op Diagnosis: Same  Procedures: CPT 20680-Removal of hardware right ankle  Surgeons : Primary: Shona Needles, MD  Assistant: Patrecia Pace, PA-C  Location: OR 3   Anesthesia:Regional with sedation   Antibiotics: Ancef 2g preopwith 1 gm vancomycin powder   Tourniquet time:None used  Estimated Blood PXTG:62 mL  Complications:None  Specimens: ID Type Source Tests Collected by Time Destination  A : Right ankle wound Tissue PATH Other AEROBIC/ANAEROBIC CULTURE W GRAM STAIN (SURGICAL/DEEP WOUND) Shona Needles, MD 03/22/2021 0803   B : Right ankle wound Body Fluid PATH Other AEROBIC/ANAEROBIC CULTURE W GRAM STAIN (SURGICAL/DEEP WOUND) Shona Needles, MD 03/22/2021 [phone]      Implants: Implant Name Type Inv. Item Serial No. Manufacturer Lot No. LRB No. Used Action  SCREW CORT EVOS ST 3.5X12 - [id number] Screw SCREW CORT EVOS ST 3.5X12  [employer]  Right 1 Explanted     Indications for Surgery: 87 year old female who sustained a right ankle fracture that was fixed with open reduction internal fixation in September 2022.  She subsequently went on to heal the ankle fracture without event however she started develop draining a wound on the lateral aspect where her distal fibular plate was.  A course of oral antibiotics was prescribed however this was not able to clear the infection.  As result I felt that she was indicated for hardware removal.  Risks and benefits were discussed with the patient family.  Risks included but not limited to bleeding, infection, malunion, nonunion, hardware failure, hardware irritation, nerve or blood vessel injury, DVT, even the possibility anesthetic complications.  They agreed to proceed with surgery and consent was obtained.  Operative  Findings: 1.  Successful removal of previous ankle hardware without any signs of deep infection.  Cultures were taken and sent to microbiology from the soft tissue in the area of the plate and the drainage.  Procedure: The patient was identified in the preoperative holding area. Consent was confirmed with the patient and their family and all questions were answered. The operative extremity was marked after confirmation with the patient. she was then brought back to the operating room by our anesthesia colleagues.  She was carefully transferred over to a radiolucent flat top table.  She was placed under sedation.  The right lower extremity was prepped and draped in usual sterile fashion.  Timeout was performed to verify the patient, the procedure, and the extremity perioperative antibiotics were dosed.  The previous lateral incision was reopened and the screws and plate were removed without difficulty.  Cultures were taken from the area of drainage and the fibrinous material surrounding it.  This was sent to microbiology for culture.  I then irrigated the wound and used a [place] elevator to scrape off the soft tissue around the bone.  I then turned my attention to the medial side where percutaneously removed the 2 medial malleolus screws.  Fluoroscopic imaging was then obtained.  The incisions were copiously irrigated once more.  Layered closure of 3-0 nylon was used to close the skin.  1 g of vancomycin powder was placed prior to closure.  Sterile dressings were applied.  The patient was then awoken from anesthesia and taken to the PACU in stable condition.  Post Op Plan/Instructions: Patient will be weightbearing as tolerated to right lower extremity.  We will  discharge her home on oral antibiotic for 3 to 5 days.  We will follow-up cultures.  She will resume her Coumadin this evening.  She will discharge home from the PACU.  I was present and performed the entire surgery.  Patrecia Pace, PA-C did assist  me throughout the case. An assistant was necessary given the difficulty in approach, maintenance of reduction and ability to instrument the fracture.   Katha Hamming, MD [facility]

## 2021-03-22 NOTE — Anesthesia Procedure Notes (Signed)
Procedure Name: MAC Date/Time: 03/22/2021 7:41 AM Performed by: Dorann Lodge, CRNA Pre-anesthesia Checklist: Patient identified, Emergency Drugs available, Suction available, Patient being monitored and Timeout performed Patient Re-evaluated:Patient Re-evaluated prior to induction Oxygen Delivery Method: Simple face mask

## 2021-03-22 NOTE — Anesthesia Procedure Notes (Signed)
Anesthesia Regional Block: Popliteal block   Pre-Anesthetic Checklist: , timeout performed,  Correct Patient, Correct Site, Correct Laterality,  Correct Procedure, Correct Position, site marked,  Risks and benefits discussed,  Surgical consent,  Pre-op evaluation,  At surgeon's request and post-op pain management  Laterality: Right  Prep: chloraprep       Needles:  Injection technique: Single-shot  Needle Type: Echogenic Stimulator Needle          Additional Needles:   Procedures:,,,, ultrasound used (permanent image in chart),,    Narrative:  Start time: 03/22/2021 7:06 AM End time: 03/22/2021 7:16 AM Injection made incrementally with aspirations every 5 mL.  Performed by: Personally  Anesthesiologist: Duane Boston, MD  Additional Notes: A functioning IV was confirmed and monitors were applied.  Sterile prep and drape, hand hygiene and sterile gloves were used.  Negative aspiration and test dose prior to incremental administration of local anesthetic. The patient tolerated the procedure well.Ultrasound  guidance: relevant anatomy identified, needle position confirmed, local anesthetic spread visualized around nerve(s), vascular puncture avoided.  Image printed for medical record.

## 2021-03-25 ENCOUNTER — Encounter (HOSPITAL_COMMUNITY): Payer: Self-pay | Admitting: Student

## 2021-03-27 LAB — AEROBIC/ANAEROBIC CULTURE W GRAM STAIN (SURGICAL/DEEP WOUND)
Culture: NO GROWTH
Culture: NORMAL

## 2021-04-01 NOTE — Progress Notes (Incomplete)
Cardiology Office Note:    Date:  04/01/2021   ID:  Amber [name], DOB Apr 25, 1934, MRN [id number]  PCP:  Antony Contras, MD  Cardiologist:  Sinclair Grooms, MD   Referring MD: Antony Contras, MD   No chief complaint on file.   History of Present Illness:    Amber [name] is a 87 y.o. female with a hx of depression, GERD, hypothyroidism, CAD s/p CABG, persistent atrial fibrillation on coumadin, chronic combined systolic and diastolic CHF, stage 3 CKD, prior tobacco abuse, COPD, prior CVA, hyperlipidemia, HTN and mild dementia who is here today for evaluation of dyspnea, volume overload, and aortic stenosis.    ***  Past Medical History:  Diagnosis Date   Abnormality of gait 08/02/2014   Cerebrovascular disease 05/21/2016   Cervical spinal stenosis    Chronic atrial fibrillation (HCC)    a. chronic/rate controlled;  b. chronic coumadin.   Chronic combined systolic and diastolic CHF (congestive heart failure) (HCC)    CKD (chronic kidney disease), stage III (HCC)    COPD with emphysema (HCC)    PFT 05/02/10>>FEV1 1.35(62%), FEV1% 66, DLCO 75%   Coronary artery disease    a. s/p CABG;  b. Abnormal nuc 2015 - managed medically.   CVA (cerebral vascular accident) ([place])    left sided weakness   Depression    Fibromyalgia    GERD (gastroesophageal reflux disease)    pepcid 2-3 times per week   Gout    Hemiparesis and alteration of sensations as late effects of stroke ([place]) 01/17/2015   History of melanoma    squamous cell, melanoma   Hyperlipidemia    a. statin intolerant, not felt to be candidate for PCSK9 due to chronically elevated CK levels.   Hypertension    Hypertensive heart disease    Hypothyroidism    Insomnia    Memory change 01/23/2014   Nasal polyposis    Osteoarthritis    Osteoporosis    Pneumonia    1990   Tobacco abuse     Past Surgical History:  Procedure Laterality Date   ABDOMINAL HYSTERECTOMY     BREAST LUMPECTOMY  1980s   Benign lesion -  right   carpel tunnel     right   CORONARY ARTERY BYPASS GRAFT  2000   EYE SURGERY     bilateral cataracts   HARDWARE REMOVAL Right 03/22/2021   Procedure: HARDWARE REMOVAL RIGHT ANKLE;  Surgeon: Shona Needles, MD;  Location: [facility];  Service: Orthopedics;  Laterality: Right;   IR GENERIC HISTORICAL  03/31/2016   IR RADIOLOGIST EVAL & MGMT 03/31/2016 [facility]-INTERV RAD   ORIF ANKLE FRACTURE Right 11/05/2020   Procedure: OPEN REDUCTION INTERNAL FIXATION (ORIF) ANKLE FRACTURE;  Surgeon: Shona Needles, MD;  Location: [facility];  Service: Orthopedics;  Laterality: Right;   RADIOLOGY WITH ANESTHESIA  12/24/2011   Procedure: RADIOLOGY WITH ANESTHESIA;  Surgeon: Medication Radiologist, MD;  Location: [facility];  Service: Radiology;  Laterality: N/A;  Extra Cranial Vascular Stent   RADIOLOGY WITH ANESTHESIA N/A 07/19/2014   Procedure: ANGIOPLASTY;  Surgeon: Luanne Bras, MD;  Location: [facility];  Service: Radiology;  Laterality: N/A;   RADIOLOGY WITH ANESTHESIA N/A 07/27/2014   Procedure: ANGIOPLASTY;  Surgeon: Luanne Bras, MD;  Location: [facility];  Service: Radiology;  Laterality: N/A;   TONSILLECTOMY      Current Medications: No outpatient medications have been marked as taking for the 04/02/21 encounter (Appointment) with Belva Crome, MD.  Allergies:   Ezetimibe, Welchol [colesevelam hcl], Adhesive [tape], Ceclor [cefaclor], Elastic bandages & [zinc], Latex, Lipitor [atorvastatin calcium], Mevacor [lovastatin], Pravachol, and Vasotec   Social History   Socioeconomic History   Marital status: Married    Spouse name: Not on file   Number of children: 2   Years of education: Not on file   Highest education level: Not on file  Occupational History   Occupation: Retired    Fish farm manager: Psychologist, sport and exercise [employer]   Occupation: Network engineer    Comment: Retired  Tobacco Use   Smoking status: Former    Packs/day: 0.40    Years: 25.00    Pack years: 10.00    Types: Cigarettes    Quit date: 02/10/1977     Years since quitting: 44.1   Smokeless tobacco: Never  Vaping Use   Vaping Use: Never used  Substance and Sexual Activity   Alcohol use: No   Drug use: No   Sexual activity: Not Currently  Other Topics Concern   Not on file  Social History Narrative   Patient is right handed.   Patient lives at home with husband   Patient drinks 8 oz caffeine occasionally.   Social Determinants of Health   Financial Resource Strain: Not on file  Food Insecurity: Not on file  Transportation Needs: Not on file  Physical Activity: Not on file  Stress: Not on file  Social Connections: Not on file     Family History: The patient's family history includes CAD in her brother and father; Diabetes type II in her brother; Stroke in her mother.  ROS:   Please see the history of present illness.    *** All other systems reviewed and are negative.  EKGs/Labs/Other Studies Reviewed:    The following studies were reviewed today:  2D Doppler echocardiogram 10/2020:  IMPRESSIONS   1. Left ventricular ejection fraction, by estimation, is 50 to 55%. The  left ventricle has low normal function. The left ventricle demonstrates  regional wall motion abnormalities (see scoring diagram/findings for  description). Left ventricular diastolic   function could not be evaluated. There is mild hypokinesis of the left  ventricular, basal-mid anteroseptal wall.   2. Right ventricular systolic function is normal. The right ventricular  size is normal. There is normal pulmonary artery systolic pressure.   3. Left atrial size was moderately dilated.   4. Right atrial size was mild to moderately dilated.   5. The mitral valve is grossly normal. Trivial mitral valve  regurgitation. Moderate to severe mitral annular calcification.   6. The aortic valve is calcified. There is moderate calcification of the  aortic valve. There is moderate thickening of the aortic valve. Aortic  valve regurgitation is not visualized.  Moderate aortic valve stenosis.  Aortic valve mean gradient measures  21.8 mmHg. Aortic valve Vmax measures 3.04 m/s.   7. The inferior vena cava is dilated in size with >50% respiratory  variability, suggesting right atrial pressure of 8 mmHg.   Comparison(s): Prior images reviewed side by side. Changes from prior  study are noted. Wall motion abnormality at the basal anteroseptum is now  only mild, previously severe. EF improved.    EKG:  EKG ***  Recent Labs: 09/06/2020: NT-Pro BNP 5,862 11/01/2020: B Natriuretic Peptide 233.2 02/27/2021: BUN 18; Creatinine, Ser 1.05; Potassium 3.9; Sodium 141 03/22/2021: Hemoglobin 12.3; Platelets 188  Recent Lipid Panel    Component Value Date/Time   CHOL 228 (H) 03/06/2017 0410   TRIG 34 03/06/2017 0410  HDL 62 03/06/2017 0410   CHOLHDL 3.7 03/06/2017 0410   VLDL 7 03/06/2017 0410   LDLCALC 159 (H) 03/06/2017 0410   LDLDIRECT 236.0 08/02/2014 1418    Physical Exam:    VS:  There were no vitals taken for this visit.    Wt Readings from Last 3 Encounters:  03/22/21 136 lb (61.7 kg)  01/07/21 138 lb 3.2 oz (62.7 kg)  12/10/20 134 lb (60.8 kg)     GEN: ***. No acute distress HEENT: Normal NECK: No JVD. LYMPHATICS: No lymphadenopathy CARDIAC: *** murmur. RRR *** gallop, or edema. VASCULAR: *** Normal Pulses. No bruits. RESPIRATORY:  Clear to auscultation without rales, wheezing or rhonchi  ABDOMEN: Soft, non-tender, non-distended, No pulsatile mass, MUSCULOSKELETAL: No deformity  SKIN: Warm and dry NEUROLOGIC:  Alert and oriented x 3 PSYCHIATRIC:  Normal affect   ASSESSMENT:    1. Chronic combined systolic and diastolic heart failure ([place])   2. Chronic atrial fibrillation (HCC)   3. Severe aortic stenosis   4. Coronary artery disease involving coronary bypass graft of native heart with angina pectoris ([place])   5. Dyslipidemia   6. Essential hypertension   7. Encounter for therapeutic drug monitoring    PLAN:    In order  of problems listed above:  ***   Medication Adjustments/Labs and Tests Ordered: Current medicines are reviewed at length with the patient today.  Concerns regarding medicines are outlined above.  No orders of the defined types were placed in this encounter.  No orders of the defined types were placed in this encounter.   There are no Patient Instructions on file for this visit.   Signed, Sinclair Grooms, MD  04/01/2021 1:49 PM    [facility]

## 2021-04-02 ENCOUNTER — Ambulatory Visit: Payer: Medicare PPO | Admitting: Interventional Cardiology

## 2021-04-02 DIAGNOSIS — I5042 Chronic combined systolic (congestive) and diastolic (congestive) heart failure: Secondary | ICD-10-CM

## 2021-04-02 DIAGNOSIS — E785 Hyperlipidemia, unspecified: Secondary | ICD-10-CM

## 2021-04-02 DIAGNOSIS — I25709 Atherosclerosis of coronary artery bypass graft(s), unspecified, with unspecified angina pectoris: Secondary | ICD-10-CM

## 2021-04-02 DIAGNOSIS — I10 Essential (primary) hypertension: Secondary | ICD-10-CM

## 2021-04-02 DIAGNOSIS — I482 Chronic atrial fibrillation, unspecified: Secondary | ICD-10-CM

## 2021-04-02 DIAGNOSIS — I350 Nonrheumatic aortic (valve) stenosis: Secondary | ICD-10-CM

## 2021-04-02 DIAGNOSIS — Z5181 Encounter for therapeutic drug level monitoring: Secondary | ICD-10-CM

## 2021-04-09 ENCOUNTER — Encounter (HOSPITAL_COMMUNITY): Payer: Self-pay | Admitting: Emergency Medicine

## 2021-04-09 ENCOUNTER — Emergency Department (HOSPITAL_COMMUNITY)

## 2021-04-09 ENCOUNTER — Inpatient Hospital Stay (HOSPITAL_COMMUNITY)
Admission: EM | Admit: 2021-04-09 | Discharge: 2021-04-15 | DRG: 492 | Disposition: A | Discharge status: Hospice | Attending: Internal Medicine | Admitting: Internal Medicine

## 2021-04-09 ENCOUNTER — Other Ambulatory Visit: Payer: Self-pay

## 2021-04-09 DIAGNOSIS — K219 Gastro-esophageal reflux disease without esophagitis: Secondary | ICD-10-CM | POA: Diagnosis present

## 2021-04-09 DIAGNOSIS — F0154 Vascular dementia, unspecified severity, with anxiety: Secondary | ICD-10-CM | POA: Diagnosis present

## 2021-04-09 DIAGNOSIS — I130 Hypertensive heart and chronic kidney disease with heart failure and stage 1 through stage 4 chronic kidney disease, or unspecified chronic kidney disease: Secondary | ICD-10-CM | POA: Diagnosis present

## 2021-04-09 DIAGNOSIS — G9341 Metabolic encephalopathy: Secondary | ICD-10-CM | POA: Diagnosis present

## 2021-04-09 DIAGNOSIS — W07XXXA Fall from chair, initial encounter: Secondary | ICD-10-CM | POA: Diagnosis present

## 2021-04-09 DIAGNOSIS — Z91048 Other nonmedicinal substance allergy status: Secondary | ICD-10-CM

## 2021-04-09 DIAGNOSIS — T148XXA Other injury of unspecified body region, initial encounter: Secondary | ICD-10-CM

## 2021-04-09 DIAGNOSIS — B3731 Acute candidiasis of vulva and vagina: Secondary | ICD-10-CM | POA: Diagnosis not present

## 2021-04-09 DIAGNOSIS — I69398 Other sequelae of cerebral infarction: Secondary | ICD-10-CM

## 2021-04-09 DIAGNOSIS — M80061A Age-related osteoporosis with current pathological fracture, right lower leg, initial encounter for fracture: Secondary | ICD-10-CM | POA: Diagnosis present

## 2021-04-09 DIAGNOSIS — I509 Heart failure, unspecified: Secondary | ICD-10-CM | POA: Diagnosis not present

## 2021-04-09 DIAGNOSIS — Z888 Allergy status to other drugs, medicaments and biological substances status: Secondary | ICD-10-CM

## 2021-04-09 DIAGNOSIS — I25709 Atherosclerosis of coronary artery bypass graft(s), unspecified, with unspecified angina pectoris: Secondary | ICD-10-CM | POA: Diagnosis not present

## 2021-04-09 DIAGNOSIS — Z8701 Personal history of pneumonia (recurrent): Secondary | ICD-10-CM

## 2021-04-09 DIAGNOSIS — Z419 Encounter for procedure for purposes other than remedying health state, unspecified: Secondary | ICD-10-CM

## 2021-04-09 DIAGNOSIS — I482 Chronic atrial fibrillation, unspecified: Secondary | ICD-10-CM | POA: Diagnosis present

## 2021-04-09 DIAGNOSIS — I110 Hypertensive heart disease with heart failure: Secondary | ICD-10-CM | POA: Diagnosis not present

## 2021-04-09 DIAGNOSIS — Z90710 Acquired absence of both cervix and uterus: Secondary | ICD-10-CM

## 2021-04-09 DIAGNOSIS — E440 Moderate protein-calorie malnutrition: Secondary | ICD-10-CM | POA: Diagnosis present

## 2021-04-09 DIAGNOSIS — N1831 Chronic kidney disease, stage 3a: Secondary | ICD-10-CM | POA: Diagnosis present

## 2021-04-09 DIAGNOSIS — M109 Gout, unspecified: Secondary | ICD-10-CM | POA: Diagnosis present

## 2021-04-09 DIAGNOSIS — Y92009 Unspecified place in unspecified non-institutional (private) residence as the place of occurrence of the external cause: Secondary | ICD-10-CM | POA: Diagnosis not present

## 2021-04-09 DIAGNOSIS — F0153 Vascular dementia, unspecified severity, with mood disturbance: Secondary | ICD-10-CM | POA: Diagnosis present

## 2021-04-09 DIAGNOSIS — E876 Hypokalemia: Secondary | ICD-10-CM | POA: Diagnosis not present

## 2021-04-09 DIAGNOSIS — I10 Essential (primary) hypertension: Secondary | ICD-10-CM | POA: Diagnosis not present

## 2021-04-09 DIAGNOSIS — Z66 Do not resuscitate: Secondary | ICD-10-CM | POA: Diagnosis present

## 2021-04-09 DIAGNOSIS — D631 Anemia in chronic kidney disease: Secondary | ICD-10-CM | POA: Diagnosis present

## 2021-04-09 DIAGNOSIS — G9340 Encephalopathy, unspecified: Secondary | ICD-10-CM

## 2021-04-09 DIAGNOSIS — M797 Fibromyalgia: Secondary | ICD-10-CM | POA: Diagnosis present

## 2021-04-09 DIAGNOSIS — D62 Acute posthemorrhagic anemia: Secondary | ICD-10-CM | POA: Diagnosis present

## 2021-04-09 DIAGNOSIS — J439 Emphysema, unspecified: Secondary | ICD-10-CM | POA: Diagnosis not present

## 2021-04-09 DIAGNOSIS — S82201B Unspecified fracture of shaft of right tibia, initial encounter for open fracture type I or II: Secondary | ICD-10-CM

## 2021-04-09 DIAGNOSIS — Z87891 Personal history of nicotine dependence: Secondary | ICD-10-CM

## 2021-04-09 DIAGNOSIS — I2510 Atherosclerotic heart disease of native coronary artery without angina pectoris: Secondary | ICD-10-CM | POA: Diagnosis present

## 2021-04-09 DIAGNOSIS — M1990 Unspecified osteoarthritis, unspecified site: Secondary | ICD-10-CM | POA: Diagnosis present

## 2021-04-09 DIAGNOSIS — R410 Disorientation, unspecified: Secondary | ICD-10-CM | POA: Diagnosis not present

## 2021-04-09 DIAGNOSIS — J449 Chronic obstructive pulmonary disease, unspecified: Secondary | ICD-10-CM | POA: Diagnosis present

## 2021-04-09 DIAGNOSIS — F0150 Vascular dementia without behavioral disturbance: Secondary | ICD-10-CM

## 2021-04-09 DIAGNOSIS — Z6822 Body mass index (BMI) 22.0-22.9, adult: Secondary | ICD-10-CM

## 2021-04-09 DIAGNOSIS — Z20822 Contact with and (suspected) exposure to covid-19: Secondary | ICD-10-CM | POA: Diagnosis present

## 2021-04-09 DIAGNOSIS — F01C0 Vascular dementia, severe, without behavioral disturbance, psychotic disturbance, mood disturbance, and anxiety: Secondary | ICD-10-CM | POA: Diagnosis not present

## 2021-04-09 DIAGNOSIS — Z881 Allergy status to other antibiotic agents status: Secondary | ICD-10-CM

## 2021-04-09 DIAGNOSIS — E785 Hyperlipidemia, unspecified: Secondary | ICD-10-CM | POA: Diagnosis present

## 2021-04-09 DIAGNOSIS — M4802 Spinal stenosis, cervical region: Secondary | ICD-10-CM | POA: Diagnosis present

## 2021-04-09 DIAGNOSIS — E039 Hypothyroidism, unspecified: Secondary | ICD-10-CM | POA: Diagnosis present

## 2021-04-09 DIAGNOSIS — Z85820 Personal history of malignant melanoma of skin: Secondary | ICD-10-CM

## 2021-04-09 DIAGNOSIS — Z7902 Long term (current) use of antithrombotics/antiplatelets: Secondary | ICD-10-CM

## 2021-04-09 DIAGNOSIS — Z7901 Long term (current) use of anticoagulants: Secondary | ICD-10-CM

## 2021-04-09 DIAGNOSIS — I5042 Chronic combined systolic (congestive) and diastolic (congestive) heart failure: Secondary | ICD-10-CM | POA: Diagnosis present

## 2021-04-09 DIAGNOSIS — D649 Anemia, unspecified: Secondary | ICD-10-CM | POA: Diagnosis not present

## 2021-04-09 DIAGNOSIS — N183 Chronic kidney disease, stage 3 unspecified: Secondary | ICD-10-CM

## 2021-04-09 DIAGNOSIS — Z823 Family history of stroke: Secondary | ICD-10-CM

## 2021-04-09 DIAGNOSIS — Z79899 Other long term (current) drug therapy: Secondary | ICD-10-CM

## 2021-04-09 DIAGNOSIS — Z8249 Family history of ischemic heart disease and other diseases of the circulatory system: Secondary | ICD-10-CM

## 2021-04-09 DIAGNOSIS — S82209B Unspecified fracture of shaft of unspecified tibia, initial encounter for open fracture type I or II: Secondary | ICD-10-CM

## 2021-04-09 DIAGNOSIS — Z79890 Hormone replacement therapy (postmenopausal): Secondary | ICD-10-CM

## 2021-04-09 DIAGNOSIS — Z91040 Latex allergy status: Secondary | ICD-10-CM

## 2021-04-09 DIAGNOSIS — S82301B Unspecified fracture of lower end of right tibia, initial encounter for open fracture type I or II: Secondary | ICD-10-CM | POA: Diagnosis not present

## 2021-04-09 LAB — BASIC METABOLIC PANEL
Anion gap: 10 (ref 5–15)
BUN: 14 mg/dL (ref 8–23)
CO2: 27 mmol/L (ref 22–32)
Calcium: 9 mg/dL (ref 8.9–10.3)
Chloride: 107 mmol/L (ref 98–111)
Creatinine, Ser: 1.12 mg/dL — ABNORMAL HIGH (ref 0.44–1.00)
GFR, Estimated: 48 mL/min — ABNORMAL LOW (ref >60)
Glucose, Bld: 132 mg/dL — ABNORMAL HIGH (ref 70–99)
Potassium: 4.2 mmol/L (ref 3.5–5.1)
Sodium: 144 mmol/L (ref 135–145)

## 2021-04-09 LAB — RESP PANEL BY RT-PCR (FLU A&B, COVID) ARPGX2
Influenza A by PCR: NEGATIVE
Influenza B by PCR: NEGATIVE
SARS Coronavirus 2 by RT PCR: NEGATIVE

## 2021-04-09 LAB — CBC
HCT: 28.8 % — ABNORMAL LOW (ref 36.0–46.0)
Hemoglobin: 9.2 g/dL — ABNORMAL LOW (ref 12.0–15.0)
MCH: 30 pg (ref 26.0–34.0)
MCHC: 31.9 g/dL (ref 30.0–36.0)
MCV: 93.8 fL (ref 80.0–100.0)
Platelets: 262 10*3/uL (ref 150–400)
RBC: 3.07 MIL/uL — ABNORMAL LOW (ref 3.87–5.11)
RDW: 15.6 % — ABNORMAL HIGH (ref 11.5–15.5)
WBC: 5.6 10*3/uL (ref 4.0–10.5)
nRBC: 0 % (ref 0.0–0.2)

## 2021-04-09 LAB — PROTIME-INR
INR: 2 — ABNORMAL HIGH (ref 0.8–1.2)
Prothrombin Time: 22.9 seconds — ABNORMAL HIGH (ref 11.4–15.2)

## 2021-04-09 MED ORDER — FENTANYL CITRATE PF 50 MCG/ML IJ SOSY
12.5000 ug | PREFILLED_SYRINGE | INTRAMUSCULAR | Status: DC | PRN
  Administered 2021-04-09: 22:00:00 12.5 ug via INTRAVENOUS
  Filled 2021-04-09: qty 1

## 2021-04-09 MED ORDER — ACETAMINOPHEN 325 MG PO TABS
650.0000 mg | ORAL_TABLET | Freq: Four times a day (QID) | ORAL | Status: DC | PRN
  Administered 2021-04-10: 650 mg via ORAL
  Filled 2021-04-09: qty 2

## 2021-04-09 MED ORDER — LACTATED RINGERS IV SOLN: Freq: Once | INTRAVENOUS | Status: AC

## 2021-04-09 MED ORDER — HYDROCODONE-ACETAMINOPHEN 5-325 MG PO TABS
1.0000 | ORAL_TABLET | ORAL | Status: DC | PRN
  Administered 2021-04-09: 1 via ORAL
  Administered 2021-04-10: 2 via ORAL
  Filled 2021-04-09: qty 1
  Filled 2021-04-09: qty 2

## 2021-04-09 MED ORDER — FENTANYL CITRATE PF 50 MCG/ML IJ SOSY
50.0000 ug | PREFILLED_SYRINGE | Freq: Once | INTRAMUSCULAR | Status: AC
  Administered 2021-04-09: 50 ug via INTRAVENOUS
  Filled 2021-04-09: qty 1

## 2021-04-09 MED ORDER — CHLORHEXIDINE GLUCONATE 4 % EX LIQD
60.0000 mL | Freq: Once | CUTANEOUS | Status: DC
  Filled 2021-04-09: qty 60

## 2021-04-09 MED ORDER — CEFAZOLIN SODIUM-DEXTROSE 2-4 GM/100ML-% IV SOLN
2.0000 g | INTRAVENOUS | Status: AC
  Administered 2021-04-09: 2 g via INTRAVENOUS
  Filled 2021-04-09: qty 100

## 2021-04-09 MED ORDER — POVIDONE-IODINE 10 % EX SWAB: 2.0000 "application " | Freq: Once | CUTANEOUS | Status: DC

## 2021-04-09 MED ORDER — CLINDAMYCIN PHOSPHATE 900 MG/50ML IV SOLN
900.0000 mg | INTRAVENOUS | Status: DC
  Filled 2021-04-09: qty 50

## 2021-04-09 MED ORDER — ACETAMINOPHEN 650 MG RE SUPP: 650.0000 mg | Freq: Four times a day (QID) | RECTAL | Status: DC | PRN

## 2021-04-09 MED ORDER — IPRATROPIUM-ALBUTEROL 0.5-2.5 (3) MG/3ML IN SOLN: 3.0000 mL | RESPIRATORY_TRACT | Status: DC | PRN

## 2021-04-09 MED ORDER — FLEET ENEMA 7-19 GM/118ML RE ENEM: 1.0000 | ENEMA | Freq: Once | RECTAL | Status: DC | PRN

## 2021-04-09 MED ORDER — BISACODYL 5 MG PO TBEC
5.0000 mg | DELAYED_RELEASE_TABLET | Freq: Every day | ORAL | Status: DC | PRN
  Administered 2021-04-15: 5 mg via ORAL
  Filled 2021-04-09: qty 1

## 2021-04-09 MED ORDER — CEFAZOLIN SODIUM-DEXTROSE 2-4 GM/100ML-% IV SOLN
2.0000 g | INTRAVENOUS | Status: AC
  Administered 2021-04-10: 2 g via INTRAVENOUS
  Filled 2021-04-09: qty 100

## 2021-04-09 MED ORDER — SENNOSIDES-DOCUSATE SODIUM 8.6-50 MG PO TABS
1.0000 | ORAL_TABLET | Freq: Every evening | ORAL | Status: DC | PRN
  Administered 2021-04-13: 1 via ORAL
  Filled 2021-04-09: qty 1

## 2021-04-09 NOTE — Progress Notes (Signed)
Manufacturing engineer [facility]) Hospital Liaison: RN note    This is a current [facility] Patient with a terminal diagnosis of  hypertensive heart chronic kidney disease stage 3 with chronic combined CHF.  [facility] [place] hospital liaison will follow for disposition and coordination of care.   A Please do not hesitate to call with questions.   Thank you,   Farrel Gordon, RN, [facility] Liaison   [phone]

## 2021-04-09 NOTE — Progress Notes (Signed)
Orthopedic Tech Progress Note Patient Details:  Amber [name] Jan 14, 1935 [id number]  I was called by MD [facility] to apply a splint to patient, after cleaning leg and applying a dressing to small wound on leg. Daughters at bedside. Patient handled it well  Ortho Devices Type of Ortho Device: Stirrup splint, Short leg splint Ortho Device/Splint Location: RLE Ortho Device/Splint Interventions: Ordered, Application   Post Interventions Patient Tolerated: Well Instructions Provided: [facility] [place] 04/09/2021, 6:38 PM

## 2021-04-09 NOTE — H&P (View-Only) (Signed)
Reason for Consult:Right open tibia fx Referring Physician: Delphina Cahill Time called: [id number] Time at bedside: [name] is an 87 y.o. female.  HPI: Amber [name] was in her recliner and slid out and hit the floor. She had immediate pain in the leg and was brought to the ED. X-rays showed a tibia fx that is open and orthopedic surgery was consulted. She is demented and cannot really contribute to history.  Past Medical History:  Diagnosis Date   Abnormality of gait 08/02/2014   Cerebrovascular disease 05/21/2016   Cervical spinal stenosis    Chronic atrial fibrillation (HCC)    a. chronic/rate controlled;  b. chronic coumadin.   Chronic combined systolic and diastolic CHF (congestive heart failure) (HCC)    CKD (chronic kidney disease), stage III (HCC)    COPD with emphysema (HCC)    PFT 05/02/10>>FEV1 1.35(62%), FEV1% 66, DLCO 75%   Coronary artery disease    a. s/p CABG;  b. Abnormal nuc 2015 - managed medically.   CVA (cerebral vascular accident) ([place])    left sided weakness   Depression    Fibromyalgia    GERD (gastroesophageal reflux disease)    pepcid 2-3 times per week   Gout    Hemiparesis and alteration of sensations as late effects of stroke ([place]) 01/17/2015   History of melanoma    squamous cell, melanoma   Hyperlipidemia    a. statin intolerant, not felt to be candidate for PCSK9 due to chronically elevated CK levels.   Hypertension    Hypertensive heart disease    Hypothyroidism    Insomnia    Memory change 01/23/2014   Nasal polyposis    Osteoarthritis    Osteoporosis    Pneumonia    1990   Tobacco abuse     Past Surgical History:  Procedure Laterality Date   ABDOMINAL HYSTERECTOMY     BREAST LUMPECTOMY  1980s   Benign lesion - right   carpel tunnel     right   CORONARY ARTERY BYPASS GRAFT  2000   EYE SURGERY     bilateral cataracts   HARDWARE REMOVAL Right 03/22/2021   Procedure: HARDWARE REMOVAL RIGHT ANKLE;  Surgeon: Shona Needles, MD;   Location: [facility];  Service: Orthopedics;  Laterality: Right;   IR GENERIC HISTORICAL  03/31/2016   IR RADIOLOGIST EVAL & MGMT 03/31/2016 [facility]-INTERV RAD   ORIF ANKLE FRACTURE Right 11/05/2020   Procedure: OPEN REDUCTION INTERNAL FIXATION (ORIF) ANKLE FRACTURE;  Surgeon: Shona Needles, MD;  Location: [facility];  Service: Orthopedics;  Laterality: Right;   RADIOLOGY WITH ANESTHESIA  12/24/2011   Procedure: RADIOLOGY WITH ANESTHESIA;  Surgeon: Medication Radiologist, MD;  Location: [facility];  Service: Radiology;  Laterality: N/A;  Extra Cranial Vascular Stent   RADIOLOGY WITH ANESTHESIA N/A 07/19/2014   Procedure: ANGIOPLASTY;  Surgeon: Luanne Bras, MD;  Location: [facility];  Service: Radiology;  Laterality: N/A;   RADIOLOGY WITH ANESTHESIA N/A 07/27/2014   Procedure: ANGIOPLASTY;  Surgeon: Luanne Bras, MD;  Location: [facility];  Service: Radiology;  Laterality: N/A;   TONSILLECTOMY      Family History  Problem Relation Age of Onset   Stroke Mother    CAD Father    Diabetes type II Brother    CAD Brother     Social History:  reports that she quit smoking about 44 years ago. Her smoking use included cigarettes. She has a 10.00 pack-year smoking history. She has never used smokeless tobacco. She  reports that she does not drink alcohol and does not use drugs.  Allergies:  Allergies  Allergen Reactions   Ezetimibe Other (See Comments)    Myalgia   Welchol [Colesevelam Hcl] Other (See Comments)    Muscle aches   Adhesive [Tape] Itching, Rash and Other (See Comments)    Burning    Ceclor [Cefaclor] Rash   Elastic Bandages & [Zinc] Rash and Other (See Comments)    Turns red on the areas it touches   Latex Itching, Rash and Other (See Comments)    Burning    Lipitor [Atorvastatin Calcium] Other (See Comments)    Increased fibromyalgia pain   Mevacor [Lovastatin] Other (See Comments)    Increased fibromyalgia pain   Pravachol Other (See Comments)    Increased fibromyalgia pain   Vasotec Other  (See Comments)    Unknown allergic reaction    Medications: I have reviewed the patient's current medications.  Results for orders placed or performed during the hospital encounter of 04/09/21 (from the past 48 hour(s))  Resp Panel by RT-PCR (Flu A&B, Covid) Nasopharyngeal Swab     Status: None   Collection Time: 04/09/21  2:00 PM   Specimen: Nasopharyngeal Swab; Nasopharyngeal(NP) swabs in vial transport medium  Result Value Ref Range   SARS Coronavirus 2 by RT PCR NEGATIVE NEGATIVE    Comment: (NOTE) SARS-CoV-2 target nucleic acids are NOT DETECTED.  The SARS-CoV-2 RNA is generally detectable in upper respiratory specimens during the acute phase of infection. The lowest concentration of SARS-CoV-2 viral copies this assay can detect is 138 copies/mL. A negative result does not preclude SARS-Cov-2 infection and should not be used as the sole basis for treatment or other patient management decisions. A negative result may occur with  improper specimen collection/handling, submission of specimen other than nasopharyngeal swab, presence of viral mutation(s) within the areas targeted by this assay, and inadequate number of viral copies(<138 copies/mL). A negative result must be combined with clinical observations, patient history, and epidemiological information. The expected result is Negative.  Fact Sheet for Patients:  EntrepreneurPulse.com.au  Fact Sheet for Healthcare Providers:  IncredibleEmployment.be  This test is no t yet approved or cleared by the [place] FDA and  has been authorized for detection and/or diagnosis of SARS-CoV-2 by FDA under an Emergency Use Authorization (EUA). This EUA will remain  in effect (meaning this test can be used) for the duration of the COVID-19 declaration under Section 564(b)(1) of the Act, 21 U.S.C.section 360bbb-3(b)(1), unless the authorization is terminated  or revoked sooner.       Influenza  A by PCR NEGATIVE NEGATIVE   Influenza B by PCR NEGATIVE NEGATIVE    Comment: (NOTE) The Xpert Xpress SARS-CoV-2/FLU/RSV plus assay is intended as an aid in the diagnosis of influenza from Nasopharyngeal swab specimens and should not be used as a sole basis for treatment. Nasal washings and aspirates are unacceptable for Xpert Xpress SARS-CoV-2/FLU/RSV testing.  Fact Sheet for Patients: EntrepreneurPulse.com.au  Fact Sheet for Healthcare Providers: IncredibleEmployment.be  This test is not yet approved or cleared by the [place] FDA and has been authorized for detection and/or diagnosis of SARS-CoV-2 by FDA under an Emergency Use Authorization (EUA). This EUA will remain in effect (meaning this test can be used) for the duration of the COVID-19 declaration under Section 564(b)(1) of the Act, 21 U.S.C. section 360bbb-3(b)(1), unless the authorization is terminated or revoked.  Performed at [facility], [place] [address], [place], [place] [address]  Basic metabolic panel     Status: Abnormal   Collection Time: 04/09/21  2:15 PM  Result Value Ref Range   Sodium 144 135 - 145 mmol/L   Potassium 4.2 3.5 - 5.1 mmol/L   Chloride 107 98 - 111 mmol/L   CO2 27 22 - 32 mmol/L   Glucose, Bld 132 (H) 70 - 99 mg/dL    Comment: Glucose reference range applies only to samples taken after fasting for at least 8 hours.   BUN 14 8 - 23 mg/dL   Creatinine, Ser 1.12 (H) 0.44 - 1.00 mg/dL   Calcium 9.0 8.9 - 10.3 mg/dL   GFR, Estimated 48 (L) >60 mL/min    Comment: (NOTE) Calculated using the CKD-EPI Creatinine Equation (2021)    Anion gap 10 5 - 15    Comment: Performed at [facility] [address], [place], [place] [address]  CBC     Status: Abnormal   Collection Time: 04/09/21  2:15 PM  Result Value Ref Range   WBC 5.6 4.0 - 10.5 K/uL   RBC 3.07 (L) 3.87 - 5.11 MIL/uL   Hemoglobin 9.2 (L) 12.0 - 15.0 g/dL   HCT 28.8 (L) 36.0 - 46.0  %   MCV 93.8 80.0 - 100.0 fL   MCH 30.0 26.0 - 34.0 pg   MCHC 31.9 30.0 - 36.0 g/dL   RDW 15.6 (H) 11.5 - 15.5 %   Platelets 262 150 - 400 K/uL   nRBC 0.0 0.0 - 0.2 %    Comment: Performed at [facility] [address], [place], [place] [address]  Protime-INR     Status: Abnormal   Collection Time: 04/09/21  2:15 PM  Result Value Ref Range   Prothrombin Time 22.9 (H) 11.4 - 15.2 seconds   INR 2.0 (H) 0.8 - 1.2    Comment: (NOTE) INR goal varies based on device and disease states. Performed at [facility], [place] [address], [place],  [address]    *Note: Due to a large number of results and/or encounters for the requested time period, some results have not been displayed. A complete set of results can be found in Results Review.    No results found.  Review of Systems  Unable to perform ROS: Dementia  Blood pressure 138/75, pulse 79, temperature 98.2 F (36.8 C), temperature source Oral, resp. rate 13, SpO2 97 %. Physical Exam Constitutional:      General: She is not in acute distress.    Appearance: She is well-developed. She is not diaphoretic.  HENT:     Head: Normocephalic and atraumatic.  Eyes:     General: No scleral icterus.       Right eye: No discharge.        Left eye: No discharge.     Conjunctiva/sclera: Conjunctivae normal.  Cardiovascular:     Rate and Rhythm: Normal rate and regular rhythm.  Pulmonary:     Effort: Pulmonary effort is normal. No respiratory distress.  Musculoskeletal:     Cervical back: Normal range of motion.     Comments: RLE 2cm longitudinal laceration medial distal lower leg with associated swelling, no ecchymosis or rash  Mod TTP lower leg  No knee or ankle effusion  Knee stable to varus/ valgus and anterior/posterior stress  Sens DPN, SPN, TN intact  Motor EHL, ext, flex, evers 5/5  DP 0, PT 0, Cap refill <2s, No significant edema  Skin:    General: Skin  is warm and dry.  Neurological:     Mental Status: She  is alert.  Psychiatric:        Mood and Affect: Mood normal.        Behavior: Behavior normal.    Assessment/Plan: Right open tibia fx -- Plan I&D, IMN today with Dr. Sammuel Hines. Please keep NPO.    Amber Abu, PA-C Orthopedic Surgery [phone] 04/09/2021, 3:46 PM

## 2021-04-09 NOTE — Significant Event (Signed)
Spoke with Amber [name] as well as her daughters who are at the bedside.  In brief patient is here in the emergency room with an open grade 1 tibia fracture after a fall out of her lift chair.  She is status post right ankle fixation with subsequent removal of hardware by Dr. Doreatha Martin.  I spoke with Dr. Doreatha Martin and that this point would plan to transition care to him for his expert traumatologist management of her open tibia fracture above the ankle.  We will plan for a short leg splint and will irrigate the wound in the emergency room with a compression dressing.  She has gotten Ancef while in the emergency room.

## 2021-04-09 NOTE — ED Triage Notes (Addendum)
Patient BIB [facility] from home. Per EMS, pt slid out of recliner, causing right ankle injury, laceration to same ankle, unsure if bone broke through the skin. Pain 7/10 with EMS, Pt received 60mcg fentanyl. 20G RFA. EMS reports good sensation in extremity, but unable to palpate a pulse in right foot.

## 2021-04-09 NOTE — H&P (Signed)
History and Physical    Amber [name] BOF:[id number] DOB: 1934-06-14 DOA: 04/09/2021  PCP: Antony Contras, MD   Chief Complaint: Mech fall - open tib/fib fracture  HPI: Amber [name] is a 87 y.o. female with medical history significant of advanced combined systolic and diastolic heart failure, CKD 3A, COPD, CAD, CVA with residual left-sided weakness, chronic atrial fibrillation on Coumadin, vascular dementia, hypertension, hypothyroidism, depression/anxiety.  Patient presents from home on hospice for advanced heart failure with open right tib-fib fracture after what appears to be a traumatic fall from lift chair.  Family at bedside notes this was unwitnessed but presume her right leg/foot remains stationary as she moved forward given anterior location of fracture.  Orthopedic surgery consulted recommending hospitalist for admission and will follow along in consult.  Plan for ORIF later this afternoon to 2023 per documentation.  Of note patient had previous fracture of the right lower extremity with hardware placed October 2022, hardware removed recently on 03/22/2021.  Review of Systems: As per HPI, limited due to patient's mental status, negative for chest pain shortness of breath nausea vomiting diarrhea constipation headache fevers or chills per family.   Assessment/Plan Principal Problem:   Open tibial fracture   Acute traumatic open tib-fib fracture -Orthopedic surgery following, tentative plan for ORIF later today 04/01/2021 -Continue analgesics, pain currently well controlled -PT OT likely to be limited given patient's left residual stroke and new right lower extremity fracture, will discuss for disposition planning in the next 24 to 48 hours postoperatively  Acute blood loss anemia on chronic anemia of chronic disease -Continue to follow labs -Notable blood loss at open fracture site, transfuse if less than 7 or becomes symptomatic  -Wound currently appears to be stabilizing at  intake  CKD 3A - Continue to follow morning labs  COPD without exacerbation -resume home inhaled medications once med rec verified postoperatively  CVA/CAD with residual left-sided weakness Hypertension Atrial fibrillation on Coumadin -Currently holding anticoagulation, antiplatelets in the setting of above bleeding and anemia -Resume home medications once clinically indicated postoperatively  Advanced vascular dementia -Continue supportive care, family at bedside, reasonable to allow overnight stay with family given advanced age and high risk for delirium/sundowning  Hypothyroidism - Resume home medications postoperatively Depression/anxiety -resume home medications postoperatively  DVT prophylaxis: None given bleeding open fracture as above, hold SCDs as well Code Status: DNR Family Communication: Daughters updated over the phone and at bedside; husband recently passed  Status is: Inpatient  Dispo: The patient is from: Home with hospice              Anticipated d/c is to: Same              Anticipated d/c date is: 48 to 72 hours pending surgical course              Patient currently not medically stable for discharge requiring surgical intervention for open leg fracture as above  Consultants:  Orthopedic surgery  Procedures:  ORIF planned later today 04/09/2021   Past Medical History:  Diagnosis Date   Abnormality of gait 08/02/2014   Cerebrovascular disease 05/21/2016   Cervical spinal stenosis    Chronic atrial fibrillation (HCC)    a. chronic/rate controlled;  b. chronic coumadin.   Chronic combined systolic and diastolic CHF (congestive heart failure) (HCC)    CKD (chronic kidney disease), stage III (HCC)    COPD with emphysema (HCC)    PFT 05/02/10>>FEV1 1.35(62%), FEV1% 66, DLCO 75%   Coronary  artery disease    a. s/p CABG;  b. Abnormal nuc 2015 - managed medically.   CVA (cerebral vascular accident) ([place])    left sided weakness   Depression    Fibromyalgia     GERD (gastroesophageal reflux disease)    pepcid 2-3 times per week   Gout    Hemiparesis and alteration of sensations as late effects of stroke ([place]) 01/17/2015   History of melanoma    squamous cell, melanoma   Hyperlipidemia    a. statin intolerant, not felt to be candidate for PCSK9 due to chronically elevated CK levels.   Hypertension    Hypertensive heart disease    Hypothyroidism    Insomnia    Memory change 01/23/2014   Nasal polyposis    Osteoarthritis    Osteoporosis    Pneumonia    1990   Tobacco abuse     Past Surgical History:  Procedure Laterality Date   ABDOMINAL HYSTERECTOMY     BREAST LUMPECTOMY  1980s   Benign lesion - right   carpel tunnel     right   CORONARY ARTERY BYPASS GRAFT  2000   EYE SURGERY     bilateral cataracts   HARDWARE REMOVAL Right 03/22/2021   Procedure: HARDWARE REMOVAL RIGHT ANKLE;  Surgeon: Shona Needles, MD;  Location: [facility];  Service: Orthopedics;  Laterality: Right;   IR GENERIC HISTORICAL  03/31/2016   IR RADIOLOGIST EVAL & MGMT 03/31/2016 [facility]-INTER[facility] RAD   ORIF ANKLE FRACTURE Right 11/05/2020   Procedure: OPEN REDUCTION INTERNAL FIXATION (ORIF) ANKLE FRACTURE;  Surgeon: Shona Needles, MD;  Location: [facility];  Service: Orthopedics;  Laterality: Right;   RADIOLOGY WITH ANESTHESIA  12/24/2011   Procedure: RADIOLOGY WITH ANESTHESIA;  Surgeon: Medication Radiologist, MD;  Location: [facility];  Service: Radiology;  Laterality: N/A;  Extra Cranial Vascular Stent   RADIOLOGY WITH ANESTHESIA N/A 07/19/2014   Procedure: ANGIOPLASTY;  Surgeon: Luanne Bras, MD;  Location: [facility];  Service: Radiology;  Laterality: N/A;   RADIOLOGY WITH ANESTHESIA N/A 07/27/2014   Procedure: ANGIOPLASTY;  Surgeon: Luanne Bras, MD;  Location: [facility];  Service: Radiology;  Laterality: N/A;   TONSILLECTOMY       reports that she quit smoking about 44 years ago. Her smoking use included cigarettes. She has a 10.00 pack-year smoking history. She has never used  smokeless tobacco. She reports that she does not drink alcohol and does not use drugs.  Allergies  Allergen Reactions   Ezetimibe Other (See Comments)    Myalgia   Welchol [Colesevelam Hcl] Other (See Comments)    Muscle aches   Adhesive [Tape] Itching, Rash and Other (See Comments)    Burning    Ceclor [Cefaclor] Rash   Elastic Bandages & [Zinc] Rash and Other (See Comments)    Turns red on the areas it touches   Latex Itching, Rash and Other (See Comments)    Burning    Lipitor [Atorvastatin Calcium] Other (See Comments)    Increased fibromyalgia pain   Mevacor [Lovastatin] Other (See Comments)    Increased fibromyalgia pain   Pravachol Other (See Comments)    Increased fibromyalgia pain   Vasotec Other (See Comments)    Unknown allergic reaction    Family History  Problem Relation Age of Onset   Stroke Mother    CAD Father    Diabetes type II Brother    CAD Brother     Prior to Admission medications   Medication Sig Start Date  End Date Taking? Authorizing Provider  allopurinol (ZYLOPRIM) 300 MG tablet Take 300 mg by mouth at bedtime.     [provider]  amLODipine (NORVASC) 5 MG tablet TAKE 1 TABLET BY MOUTH EVERY DAY Patient taking differently: Take 5 mg by mouth every evening. 08/08/20   Belva Crome, MD  citalopram (CELEXA) 20 MG tablet TAKE 1 TABLET BY MOUTH TWICE A DAY 11/09/14   Darlin Coco, MD  clopidogrel (PLAVIX) 75 MG tablet TAKE 1 TABLET (75 MG TOTAL) BY MOUTH DAILY. 11/20/20   Belva Crome, MD  diphenoxylate-atropine (LOMOTIL) 2.5-0.025 MG tablet Take 1 tablet by mouth 3 (three) times daily as needed for diarrhea or loose stools.    [provider]  donepezil (ARICEPT) 5 MG tablet Take 1 tablet (5 mg total) by mouth daily. 04/18/20   Suzzanne Cloud, NP  ergocalciferol (VITAMIN D2) 50000 UNITS capsule Take 50,000 Units by mouth 2 (two) times a week. Tuesdays and Fridays    [provider]  furosemide (LASIX) 80 MG tablet Take  80 mg by mouth twice a day alternating with 80 mg once daily every other day Patient taking differently: Take 120 mg by mouth in the morning. 12/10/20   Burnell Blanks, MD  ipratropium-albuterol (DUONEB) 0.5-2.5 (3) MG/3ML SOLN Take 3 mLs by nebulization every 4 (four) hours as needed (Use at home). Patient taking differently: Take 3 mLs by nebulization every 4 (four) hours as needed (wheezing/shortness of breath). 08/04/19   Mariel Aloe, MD  levothyroxine (SYNTHROID, LEVOTHROID) 25 MCG tablet Take 25 mcg by mouth daily before breakfast.    [provider]  losartan (COZAAR) 25 MG tablet Take 1 tablet (25 mg total) by mouth daily. 09/18/20   Jettie Booze, MD  memantine (NAMENDA) 10 MG tablet Take 1 tablet (10 mg total) by mouth 2 (two) times daily. 04/18/20   Suzzanne Cloud, NP  metoprolol succinate (TOPROL-XL) 25 MG 24 hr tablet TAKE 1 TABLET BY MOUTH EVERY DAY 09/18/20   Jettie Booze, MD  nitroGLYCERIN (NITROSTAT) 0.4 MG SL tablet Place 1 tablet (0.4 mg total) under the tongue every 5 (five) minutes as needed for chest pain. 09/30/19   Belva Crome, MD  potassium chloride SA (KLOR-CON M20) 20 MEQ tablet Take 2 tablets (40 mEq total) by mouth daily. Patient taking differently: Take 20 mEq by mouth 2 (two) times daily. 12/11/20   Burnell Blanks, MD  traMADol (ULTRAM) 50 MG tablet Take 1 tablet (50 mg total) by mouth every 8 (eight) hours as needed for moderate pain or severe pain. 03/22/21   Corinne Ports, PA-C  warfarin (COUMADIN) 5 MG tablet TAKE AS DIRECTED BY COUMADIN CLINIC Patient taking differently: Take 5 mg by mouth daily at 4 PM. 08/31/20   Belva Crome, MD  ZINC-VITAMIN C PO Take 1 tablet by mouth every evening.    [provider]    Physical Exam: Vitals:   04/09/21 1407 04/09/21 1500 04/09/21 1530 04/09/21 1600  BP: 123/63 (!) 143/70 138/75 135/83  Pulse: 78 72 79 76  Resp: 13 16 13 17   Temp: 98.2 F (36.8 C)     TempSrc: Oral      SpO2: 95% 97% 97% 97%    Constitutional: NAD, calm, comfortable Vitals:   04/09/21 1407 04/09/21 1500 04/09/21 1530 04/09/21 1600  BP: 123/63 (!) 143/70 138/75 135/83  Pulse: 78 72 79 76  Resp: 13 16 13 17   Temp: 98.2 F (36.8  C)     TempSrc: Oral     SpO2: 95% 97% 97% 97%   General:  Pleasantly resting in bed, No acute distress. HEENT:  Normocephalic atraumatic.  Sclerae nonicteric, noninjected.  Extraocular movements intact bilaterally. Lungs:  Clear to auscultate bilaterally without rhonchi, wheeze, or rales. Heart: Blowing holosystolic murmur left sternal border. Abdomen:  Soft, nontender, nondistended.  Without guarding or rebound. Extremities: Left upper and lower extremity 2/5; right lower extremity anteromedial distal open wound at the site of tibia fracture Vascular:  Dorsalis pedis and posterior tibial pulses weakly palpable bilaterally.  Labs on Admission: I have personally reviewed following labs and imaging studies  CBC: Recent Labs  Lab 04/09/21 1415  WBC 5.6  HGB 9.2*  HCT 28.8*  MCV 93.8  PLT 492   Basic Metabolic Panel: Recent Labs  Lab 04/09/21 1415  NA 144  K 4.2  CL 107  CO2 27  GLUCOSE 132*  BUN 14  CREATININE 1.12*  CALCIUM 9.0   GFR: CrCl cannot be calculated (Unknown ideal weight.). Liver Function Tests: No results for input(s): AST, ALT, ALKPHOS, BILITOT, PROT, ALBUMIN in the last 168 hours. No results for input(s): LIPASE, AMYLASE in the last 168 hours. No results for input(s): AMMONIA in the last 168 hours. Coagulation Profile: Recent Labs  Lab 04/09/21 1415  INR 2.0*   Cardiac Enzymes: No results for input(s): CKTOTAL, CKMB, CKMBINDEX, TROPONINI in the last 168 hours. BNP (last 3 results) Recent Labs    09/06/20 1040  PROBNP 5,862*   HbA1C: No results for input(s): HGBA1C in the last 72 hours. CBG: No results for input(s): GLUCAP in the last 168 hours. Lipid Profile: No results for input(s): CHOL, HDL, LDLCALC,  TRIG, CHOLHDL, LDLDIRECT in the last 72 hours. Thyroid Function Tests: No results for input(s): TSH, T4TOTAL, FREET4, T3FREE, THYROIDAB in the last 72 hours. Anemia Panel: No results for input(s): VITAMINB12, FOLATE, FERRITIN, TIBC, IRON, RETICCTPCT in the last 72 hours. Urine analysis:    Component Value Date/Time   COLORURINE YELLOW 11/04/2020 2155   APPEARANCEUR CLEAR 11/04/2020 2155   LABSPEC <1.005 (L) 11/04/2020 2155   PHURINE 6.0 11/04/2020 2155   GLUCOSEU NEGATIVE 11/04/2020 2155   GLUCOSEU NEGATIVE 02/28/2013 1235   HGBUR SMALL (A) 11/04/2020 2155   BILIRUBINUR NEGATIVE 11/04/2020 2155   KETONESUR NEGATIVE 11/04/2020 2155   PROTEINUR NEGATIVE 11/04/2020 2155   UROBILINOGEN 0.2 02/28/2013 1235   NITRITE NEGATIVE 11/04/2020 2155   LEUKOCYTESUR SMALL (A) 11/04/2020 2155    Radiological Exams on Admission: DG Chest Portable 1 View  Result Date: 04/09/2021 CLINICAL DATA:  Fall.  Additional history provided: EXAM: PORTABLE CHEST 1 VIEW COMPARISON:  Prior chest radiographs 12/04/2020 and earlier. FINDINGS: Prior median sternotomy. Mild cardiomegaly, unchanged. Aortic atherosclerosis. Small left pleural effusion. Minimal ill-defined opacity within the left lung base. No appreciable airspace consolidation within the right lung. No evidence of pulmonary edema. No evidence of pneumothorax. No acute bony abnormality identified. IMPRESSION: Small left pleural effusion. Minimal ill-defined opacity within the left lung base, which is favored to reflect atelectasis. However, pneumonia is difficult to exclude and clinical correlation is recommended. Mild cardiomegaly, unchanged. Aortic Atherosclerosis (ICD10-I70.0). Electronically Signed   By: Kellie Simmering D.O.   On: 04/09/2021 15:53   DG Tibia/Fibula Right Port  Result Date: 04/09/2021 CLINICAL DATA:  Slipped out of a recliner, RIGHT ankle injury, laceration EXAM: PORTABLE RIGHT TIBIA AND FIBULA - 2 VIEW COMPARISON:  None FINDINGS: Osseous  demineralization. Degenerative changes at RIGHT ankle joint  due to old healed bimalleolar fractures. Knee joint space narrowing, mild. Comminuted oblique fractures of the mid to distal RIGHT tibial diaphysis, mildly displaced. No additional fracture or dislocation. Pin tracks at distal fibula from prior ORIF. IMPRESSION: Displaced oblique distal RIGHT tibial diaphyseal fractures. Osseous demineralization with old posttraumatic deformities of the medial and lateral malleoli as well as prior hardware removal. Electronically Signed   By: Lavonia Dana M.D.   On: 04/09/2021 15:53     [facility]  For contact please use secure messenger on Epic  If 7PM-7AM, please contact night-coverage located on [email or web address]   04/09/2021, 4:07 PM

## 2021-04-09 NOTE — Consult Note (Signed)
Reason for Consult:Right open tibia fx Referring Physician: Delphina Cahill Time called: [id number] Time at bedside: [name] is an 87 y.o. female.  HPI: Amber [name] was in her recliner and slid out and hit the floor. She had immediate pain in the leg and was brought to the ED. X-rays showed a tibia fx that is open and orthopedic surgery was consulted. She is demented and cannot really contribute to history.  Past Medical History:  Diagnosis Date   Abnormality of gait 08/02/2014   Cerebrovascular disease 05/21/2016   Cervical spinal stenosis    Chronic atrial fibrillation (HCC)    a. chronic/rate controlled;  b. chronic coumadin.   Chronic combined systolic and diastolic CHF (congestive heart failure) (HCC)    CKD (chronic kidney disease), stage III (HCC)    COPD with emphysema (HCC)    PFT 05/02/10>>FEV1 1.35(62%), FEV1% 66, DLCO 75%   Coronary artery disease    a. s/p CABG;  b. Abnormal nuc 2015 - managed medically.   CVA (cerebral vascular accident) ([place])    left sided weakness   Depression    Fibromyalgia    GERD (gastroesophageal reflux disease)    pepcid 2-3 times per week   Gout    Hemiparesis and alteration of sensations as late effects of stroke ([place]) 01/17/2015   History of melanoma    squamous cell, melanoma   Hyperlipidemia    a. statin intolerant, not felt to be candidate for PCSK9 due to chronically elevated CK levels.   Hypertension    Hypertensive heart disease    Hypothyroidism    Insomnia    Memory change 01/23/2014   Nasal polyposis    Osteoarthritis    Osteoporosis    Pneumonia    1990   Tobacco abuse     Past Surgical History:  Procedure Laterality Date   ABDOMINAL HYSTERECTOMY     BREAST LUMPECTOMY  1980s   Benign lesion - right   carpel tunnel     right   CORONARY ARTERY BYPASS GRAFT  2000   EYE SURGERY     bilateral cataracts   HARDWARE REMOVAL Right 03/22/2021   Procedure: HARDWARE REMOVAL RIGHT ANKLE;  Surgeon: Shona Needles, MD;   Location: [facility];  Service: Orthopedics;  Laterality: Right;   IR GENERIC HISTORICAL  03/31/2016   IR RADIOLOGIST EVAL & MGMT 03/31/2016 [facility]   ORIF ANKLE FRACTURE Right 11/05/2020   Procedure: OPEN REDUCTION INTERNAL FIXATION (ORIF) ANKLE FRACTURE;  Surgeon: Shona Needles, MD;  Location: [facility];  Service: Orthopedics;  Laterality: Right;   RADIOLOGY WITH ANESTHESIA  12/24/2011   Procedure: RADIOLOGY WITH ANESTHESIA;  Surgeon: Medication Radiologist, MD;  Location: [facility];  Service: Radiology;  Laterality: N/A;  Extra Cranial Vascular Stent   RADIOLOGY WITH ANESTHESIA N/A 07/19/2014   Procedure: ANGIOPLASTY;  Surgeon: Luanne Bras, MD;  Location: [facility];  Service: Radiology;  Laterality: N/A;   RADIOLOGY WITH ANESTHESIA N/A 07/27/2014   Procedure: ANGIOPLASTY;  Surgeon: Luanne Bras, MD;  Location: [facility];  Service: Radiology;  Laterality: N/A;   TONSILLECTOMY      Family History  Problem Relation Age of Onset   Stroke Mother    CAD Father    Diabetes type II Brother    CAD Brother     Social History:  reports that she quit smoking about 44 years ago. Her smoking use included cigarettes. She has a 10.00 pack-year smoking history. She has never used smokeless tobacco. She  reports that she does not drink alcohol and does not use drugs.  Allergies:  Allergies  Allergen Reactions   Ezetimibe Other (See Comments)    Myalgia   Welchol [Colesevelam Hcl] Other (See Comments)    Muscle aches   Adhesive [Tape] Itching, Rash and Other (See Comments)    Burning    Ceclor [Cefaclor] Rash   Elastic Bandages & [Zinc] Rash and Other (See Comments)    Turns red on the areas it touches   Latex Itching, Rash and Other (See Comments)    Burning    Lipitor [Atorvastatin Calcium] Other (See Comments)    Increased fibromyalgia pain   Mevacor [Lovastatin] Other (See Comments)    Increased fibromyalgia pain   Pravachol Other (See Comments)    Increased fibromyalgia pain   Vasotec Other  (See Comments)    Unknown allergic reaction    Medications: I have reviewed the patient's current medications.  Results for orders placed or performed during the hospital encounter of 04/09/21 (from the past 48 hour(s))  Resp Panel by RT-PCR (Flu A&B, Covid) Nasopharyngeal Swab     Status: None   Collection Time: 04/09/21  2:00 PM   Specimen: Nasopharyngeal Swab; Nasopharyngeal(NP) swabs in vial transport medium  Result Value Ref Range   SARS Coronavirus 2 by RT PCR NEGATIVE NEGATIVE    Comment: (NOTE) SARS-CoV-2 target nucleic acids are NOT DETECTED.  The SARS-CoV-2 RNA is generally detectable in upper respiratory specimens during the acute phase of infection. The lowest concentration of SARS-CoV-2 viral copies this assay can detect is 138 copies/mL. A negative result does not preclude SARS-Cov-2 infection and should not be used as the sole basis for treatment or other patient management decisions. A negative result may occur with  improper specimen collection/handling, submission of specimen other than nasopharyngeal swab, presence of viral mutation(s) within the areas targeted by this assay, and inadequate number of viral copies(<138 copies/mL). A negative result must be combined with clinical observations, patient history, and epidemiological information. The expected result is Negative.  Fact Sheet for Patients:  EntrepreneurPulse.com.au  Fact Sheet for Healthcare Providers:  IncredibleEmployment.be  This test is no t yet approved or cleared by the [place] FDA and  has been authorized for detection and/or diagnosis of SARS-CoV-2 by FDA under an Emergency Use Authorization (EUA). This EUA will remain  in effect (meaning this test can be used) for the duration of the COVID-19 declaration under Section 564(b)(1) of the Act, 21 U.S.C.section 360bbb-3(b)(1), unless the authorization is terminated  or revoked sooner.       Influenza  A by PCR NEGATIVE NEGATIVE   Influenza B by PCR NEGATIVE NEGATIVE    Comment: (NOTE) The Xpert Xpress SARS-CoV-2/FLU/RSV plus assay is intended as an aid in the diagnosis of influenza from Nasopharyngeal swab specimens and should not be used as a sole basis for treatment. Nasal washings and aspirates are unacceptable for Xpert Xpress SARS-CoV-2/FLU/RSV testing.  Fact Sheet for Patients: EntrepreneurPulse.com.au  Fact Sheet for Healthcare Providers: IncredibleEmployment.be  This test is not yet approved or cleared by the [place] FDA and has been authorized for detection and/or diagnosis of SARS-CoV-2 by FDA under an Emergency Use Authorization (EUA). This EUA will remain in effect (meaning this test can be used) for the duration of the COVID-19 declaration under Section 564(b)(1) of the Act, 21 U.S.C. section 360bbb-3(b)(1), unless the authorization is terminated or revoked.  Performed at [facility], [place] [address], [place], [place] [address]  Basic metabolic panel     Status: Abnormal   Collection Time: 04/09/21  2:15 PM  Result Value Ref Range   Sodium 144 135 - 145 mmol/L   Potassium 4.2 3.5 - 5.1 mmol/L   Chloride 107 98 - 111 mmol/L   CO2 27 22 - 32 mmol/L   Glucose, Bld 132 (H) 70 - 99 mg/dL    Comment: Glucose reference range applies only to samples taken after fasting for at least 8 hours.   BUN 14 8 - 23 mg/dL   Creatinine, Ser 1.12 (H) 0.44 - 1.00 mg/dL   Calcium 9.0 8.9 - 10.3 mg/dL   GFR, Estimated 48 (L) >60 mL/min    Comment: (NOTE) Calculated using the CKD-EPI Creatinine Equation (2021)    Anion gap 10 5 - 15    Comment: Performed at [facility] [address], [place], [place] [address]  CBC     Status: Abnormal   Collection Time: 04/09/21  2:15 PM  Result Value Ref Range   WBC 5.6 4.0 - 10.5 K/uL   RBC 3.07 (L) 3.87 - 5.11 MIL/uL   Hemoglobin 9.2 (L) 12.0 - 15.0 g/dL   HCT 28.8 (L) 36.0 - 46.0  %   MCV 93.8 80.0 - 100.0 fL   MCH 30.0 26.0 - 34.0 pg   MCHC 31.9 30.0 - 36.0 g/dL   RDW 15.6 (H) 11.5 - 15.5 %   Platelets 262 150 - 400 K/uL   nRBC 0.0 0.0 - 0.2 %    Comment: Performed at [facility] [address] [place], [place] [address]  Protime-INR     Status: Abnormal   Collection Time: 04/09/21  2:15 PM  Result Value Ref Range   Prothrombin Time 22.9 (H) 11.4 - 15.2 seconds   INR 2.0 (H) 0.8 - 1.2    Comment: (NOTE) INR goal varies based on device and disease states. Performed at [facility], [place] [address] [place], [place] [address]    *Note: Due to a large number of results and/or encounters for the requested time period, some results have not been displayed. A complete set of results can be found in Results Review.    No results found.  Review of Systems  Unable to perform ROS: Dementia  Blood pressure 138/75, pulse 79, temperature 98.2 F (36.8 C), temperature source Oral, resp. rate 13, SpO2 97 %. Physical Exam Constitutional:      General: She is not in acute distress.    Appearance: She is well-developed. She is not diaphoretic.  HENT:     Head: Normocephalic and atraumatic.  Eyes:     General: No scleral icterus.       Right eye: No discharge.        Left eye: No discharge.     Conjunctiva/sclera: Conjunctivae normal.  Cardiovascular:     Rate and Rhythm: Normal rate and regular rhythm.  Pulmonary:     Effort: Pulmonary effort is normal. No respiratory distress.  Musculoskeletal:     Cervical back: Normal range of motion.     Comments: RLE 2cm longitudinal laceration medial distal lower leg with associated swelling, no ecchymosis or rash  Mod TTP lower leg  No knee or ankle effusion  Knee stable to varus/ valgus and anterior/posterior stress  Sens DPN, SPN, TN intact  Motor EHL, ext, flex, evers 5/5  DP 0, PT 0, Cap refill <2s, No significant edema  Skin:    General: Skin  is warm and dry.  Neurological:     Mental Status: She  is alert.  Psychiatric:        Mood and Affect: Mood normal.        Behavior: Behavior normal.    Assessment/Plan: Right open tibia fx -- Plan I&D, IMN today with Dr. Sammuel Hines. Please keep NPO.    Lisette Abu, PA-C Orthopedic Surgery [phone] 04/09/2021, 3:46 PM

## 2021-04-09 NOTE — ED Provider Notes (Signed)
[facility] EMERGENCY DEPARTMENT Provider Note   CSN: [id number] Arrival date & time: 04/09/21  1344     History  Chief Complaint  Patient presents with   Leg Injury    Amber [name] is a 87 y.o. female.  HPI Patient presents with fall.  Reportedly slid out of chair.  Pain in right lower extremity.  Reportedly no other injury.  Patient states she is not on blood thinners but appears to be on Coumadin and Plavix.  Denies hitting head.  Denies having previous orthopedic surgery but appears to just had a plate removed from her right ankle, by Dr. Doreatha Martin.  Laceration to right lower leg.    Home Medications Prior to Admission medications   Medication Sig Start Date End Date Taking? Authorizing Provider  allopurinol (ZYLOPRIM) 300 MG tablet Take 300 mg by mouth at bedtime.     [provider]  amLODipine (NORVASC) 5 MG tablet TAKE 1 TABLET BY MOUTH EVERY DAY Patient taking differently: Take 5 mg by mouth every evening. 08/08/20   Belva Crome, MD  citalopram (CELEXA) 20 MG tablet TAKE 1 TABLET BY MOUTH TWICE A DAY 11/09/14   Darlin Coco, MD  clopidogrel (PLAVIX) 75 MG tablet TAKE 1 TABLET (75 MG TOTAL) BY MOUTH DAILY. 11/20/20   Belva Crome, MD  diphenoxylate-atropine (LOMOTIL) 2.5-0.025 MG tablet Take 1 tablet by mouth 3 (three) times daily as needed for diarrhea or loose stools.    [provider]  donepezil (ARICEPT) 5 MG tablet Take 1 tablet (5 mg total) by mouth daily. 04/18/20   Suzzanne Cloud, NP  ergocalciferol (VITAMIN D2) 50000 UNITS capsule Take 50,000 Units by mouth 2 (two) times a week. Tuesdays and Fridays    [provider]  furosemide (LASIX) 80 MG tablet Take 80 mg by mouth twice a day alternating with 80 mg once daily every other day Patient taking differently: Take 120 mg by mouth in the morning. 12/10/20   Burnell Blanks, MD  ipratropium-albuterol (DUONEB) 0.5-2.5 (3) MG/3ML SOLN Take 3 mLs by nebulization  every 4 (four) hours as needed (Use at home). Patient taking differently: Take 3 mLs by nebulization every 4 (four) hours as needed (wheezing/shortness of breath). 08/04/19   Mariel Aloe, MD  levothyroxine (SYNTHROID, LEVOTHROID) 25 MCG tablet Take 25 mcg by mouth daily before breakfast.    [provider]  losartan (COZAAR) 25 MG tablet Take 1 tablet (25 mg total) by mouth daily. 09/18/20   Jettie Booze, MD  memantine (NAMENDA) 10 MG tablet Take 1 tablet (10 mg total) by mouth 2 (two) times daily. 04/18/20   Suzzanne Cloud, NP  metoprolol succinate (TOPROL-XL) 25 MG 24 hr tablet TAKE 1 TABLET BY MOUTH EVERY DAY 09/18/20   Jettie Booze, MD  nitroGLYCERIN (NITROSTAT) 0.4 MG SL tablet Place 1 tablet (0.4 mg total) under the tongue every 5 (five) minutes as needed for chest pain. 09/30/19   Belva Crome, MD  potassium chloride SA (KLOR-CON M20) 20 MEQ tablet Take 2 tablets (40 mEq total) by mouth daily. Patient taking differently: Take 20 mEq by mouth 2 (two) times daily. 12/11/20   Burnell Blanks, MD  traMADol (ULTRAM) 50 MG tablet Take 1 tablet (50 mg total) by mouth every 8 (eight) hours as needed for moderate pain or severe pain. 03/22/21   Corinne Ports, PA-C  warfarin (COUMADIN) 5 MG tablet TAKE AS DIRECTED BY COUMADIN CLINIC Patient taking differently:  Take 5 mg by mouth daily at 4 PM. 08/31/20   Belva Crome, MD  ZINC-VITAMIN C PO Take 1 tablet by mouth every evening.    [provider]      Allergies    Ezetimibe, Welchol [colesevelam hcl], Adhesive [tape], Ceclor [cefaclor], Elastic bandages & [zinc], Latex, Lipitor [atorvastatin calcium], Mevacor [lovastatin], Pravachol, and Vasotec    Review of Systems   Review of Systems  Skin:  Positive for wound.   Physical Exam Updated Vital Signs BP 123/63 (BP Location: Left Arm)    Pulse 78    Temp 98.2 F (36.8 C) (Oral)    Resp 13    SpO2 95%  Physical Exam Vitals reviewed.  Constitutional:       Appearance: Normal appearance.  HENT:     Head: Normocephalic.  Cardiovascular:     Rate and Rhythm: Rhythm irregular.  Pulmonary:     Breath sounds: No wheezing.  Abdominal:     Tenderness: There is no abdominal tenderness.  Musculoskeletal:        General: Tenderness present.     Cervical back: Neck supple.     Comments: Tenderness to somewhat distal right lower leg.  Approximately 1 to 2 cm laceration anteriorly over the tibia.  Does have apparent bony injury at this area.  Sensation grossly intact in right foot.  Right foot is warm.  No tenderness over ankle.  Neurological:     Mental Status: She is alert.    ED Results / Procedures / Treatments   Labs (all labs ordered are listed, but only abnormal results are displayed) Labs Reviewed  RESP PANEL BY RT-PCR (FLU A&B, COVID) [id number]  BASIC METABOLIC PANEL  CBC  PROTIME-INR    EKG None  Radiology No results found.  Procedures Procedures    Medications Ordered in ED Medications  ceFAZolin (ANCEF) IVPB 2g/100 mL premix (2 g Intravenous New Bag/Given 04/09/21 1418)    ED Course/ Medical Decision Making/ A&P                           Medical Decision Making Amount and/or Complexity of Data Reviewed Labs: ordered. Radiology: ordered and independent interpretation performed. Decision-making details documented in ED Course.  Risk Prescription drug management. Decision regarding hospitalization.   Patient presents after fall.  Reportedly slid out of her chair onto her right leg.  Apparent open fracture upon arrival.  Antibiotics given emergently.  Hilbert Odor from orthopedic surgery also called before x-ray upon diagnosis of the open fracture.  No other apparent injury patient's daughter later came and was able to provide more the patient's history.  Did have recent right ankle surgery to have some infected hardware removed.  Hemoglobin mildly decreased.  I have independently interpreted the x-rays did show the open  tibial fracture.  Care turned over to Dr. Roslynn Amble.        Final Clinical Impression(s) / ED Diagnoses Final diagnoses:  Fracture    Rx / DC Orders ED Discharge Orders     None         Davonna Belling, MD 04/10/21 [phone]

## 2021-04-09 NOTE — ED Provider Notes (Signed)
Signout note  87 year old lady with open tib-fib fracture.  Awaiting x-rays, final plan from orthopedic surgery.  Antibiotic started.  Fall from recliner.  Discussed with Hilbert Odor, requesting medicine admit as primary, they will take to the OR later today, keep patient n.p.o. for now.   Lucrezia Starch, MD 04/09/21 [phone]

## 2021-04-09 NOTE — ED Notes (Signed)
Ortho at bedside.

## 2021-04-10 ENCOUNTER — Other Ambulatory Visit: Payer: Self-pay

## 2021-04-10 ENCOUNTER — Inpatient Hospital Stay (HOSPITAL_COMMUNITY): Admitting: Anesthesiology

## 2021-04-10 ENCOUNTER — Encounter (HOSPITAL_COMMUNITY)
Admission: EM | Disposition: A | Discharge status: Hospice | Payer: Self-pay | Source: Home / Self Care | Attending: Internal Medicine

## 2021-04-10 ENCOUNTER — Encounter (HOSPITAL_COMMUNITY): Payer: Self-pay | Admitting: Internal Medicine

## 2021-04-10 ENCOUNTER — Inpatient Hospital Stay (HOSPITAL_COMMUNITY)

## 2021-04-10 DIAGNOSIS — D62 Acute posthemorrhagic anemia: Secondary | ICD-10-CM

## 2021-04-10 DIAGNOSIS — I110 Hypertensive heart disease with heart failure: Secondary | ICD-10-CM

## 2021-04-10 DIAGNOSIS — N183 Chronic kidney disease, stage 3 unspecified: Secondary | ICD-10-CM

## 2021-04-10 DIAGNOSIS — S82301B Unspecified fracture of lower end of right tibia, initial encounter for open fracture type I or II: Secondary | ICD-10-CM | POA: Diagnosis not present

## 2021-04-10 DIAGNOSIS — S82201B Unspecified fracture of shaft of right tibia, initial encounter for open fracture type I or II: Secondary | ICD-10-CM

## 2021-04-10 DIAGNOSIS — I2510 Atherosclerotic heart disease of native coronary artery without angina pectoris: Secondary | ICD-10-CM

## 2021-04-10 DIAGNOSIS — I509 Heart failure, unspecified: Secondary | ICD-10-CM

## 2021-04-10 DIAGNOSIS — F0150 Vascular dementia without behavioral disturbance: Secondary | ICD-10-CM

## 2021-04-10 DIAGNOSIS — I10 Essential (primary) hypertension: Secondary | ICD-10-CM

## 2021-04-10 HISTORY — PX: TIBIA IM NAIL INSERTION: SHX2516

## 2021-04-10 LAB — COMPREHENSIVE METABOLIC PANEL
ALT: 14 U/L (ref 0–44)
AST: 23 U/L (ref 15–41)
Albumin: 2.9 g/dL — ABNORMAL LOW (ref 3.5–5.0)
Alkaline Phosphatase: 47 U/L (ref 38–126)
Anion gap: 12 (ref 5–15)
BUN: 12 mg/dL (ref 8–23)
CO2: 24 mmol/L (ref 22–32)
Calcium: 8.5 mg/dL — ABNORMAL LOW (ref 8.9–10.3)
Chloride: 106 mmol/L (ref 98–111)
Creatinine, Ser: 0.91 mg/dL (ref 0.44–1.00)
GFR, Estimated: >60 mL/min (ref >60)
Glucose, Bld: 117 mg/dL — ABNORMAL HIGH (ref 70–99)
Potassium: 3.7 mmol/L (ref 3.5–5.1)
Sodium: 142 mmol/L (ref 135–145)
Total Bilirubin: 0.7 mg/dL (ref 0.3–1.2)
Total Protein: 5.4 g/dL — ABNORMAL LOW (ref 6.5–8.1)

## 2021-04-10 LAB — CBC
HCT: 32.2 % — ABNORMAL LOW (ref 36.0–46.0)
Hemoglobin: 10.3 g/dL — ABNORMAL LOW (ref 12.0–15.0)
MCH: 29.6 pg (ref 26.0–34.0)
MCHC: 32 g/dL (ref 30.0–36.0)
MCV: 92.5 fL (ref 80.0–100.0)
Platelets: 277 10*3/uL (ref 150–400)
RBC: 3.48 MIL/uL — ABNORMAL LOW (ref 3.87–5.11)
RDW: 15.5 % (ref 11.5–15.5)
WBC: 7.8 10*3/uL (ref 4.0–10.5)
nRBC: 0 % (ref 0.0–0.2)

## 2021-04-10 LAB — PROTIME-INR
INR: 2.3 — ABNORMAL HIGH (ref 0.8–1.2)
Prothrombin Time: 24.9 seconds — ABNORMAL HIGH (ref 11.4–15.2)

## 2021-04-10 SURGERY — INSERTION, INTRAMEDULLARY ROD, TIBIA
Anesthesia: Monitor Anesthesia Care | Laterality: Right

## 2021-04-10 MED ORDER — ORAL CARE MOUTH RINSE: 15.0000 mL | Freq: Once | OROMUCOSAL | Status: AC

## 2021-04-10 MED ORDER — ONDANSETRON HCL 4 MG/2ML IJ SOLN
INTRAMUSCULAR | Status: AC
  Filled 2021-04-10: qty 2

## 2021-04-10 MED ORDER — CHLORHEXIDINE GLUCONATE 0.12 % MT SOLN: 15.0000 mL | Freq: Once | OROMUCOSAL | Status: AC

## 2021-04-10 MED ORDER — WARFARIN SODIUM 5 MG PO TABS
5.0000 mg | ORAL_TABLET | Freq: Once | ORAL | Status: AC
  Administered 2021-04-10: 5 mg via ORAL
  Filled 2021-04-10: qty 1

## 2021-04-10 MED ORDER — WARFARIN - PHARMACIST DOSING INPATIENT: Freq: Every day | Status: DC

## 2021-04-10 MED ORDER — LACTATED RINGERS IV SOLN: INTRAVENOUS | Status: DC

## 2021-04-10 MED ORDER — VANCOMYCIN HCL 1000 MG IV SOLR
INTRAVENOUS | Status: DC | PRN
  Administered 2021-04-10: 1000 mg

## 2021-04-10 MED ORDER — MEMANTINE HCL 10 MG PO TABS
10.0000 mg | ORAL_TABLET | Freq: Two times a day (BID) | ORAL | Status: DC
  Administered 2021-04-10 – 2021-04-11 (×4): 10 mg via ORAL
  Filled 2021-04-10 (×5): qty 1

## 2021-04-10 MED ORDER — FENTANYL CITRATE (PF) 100 MCG/2ML IJ SOLN: 25.0000 ug | INTRAMUSCULAR | Status: DC | PRN

## 2021-04-10 MED ORDER — ALLOPURINOL 300 MG PO TABS
300.0000 mg | ORAL_TABLET | Freq: Every day | ORAL | Status: DC
  Administered 2021-04-10 – 2021-04-14 (×7): 300 mg via ORAL
  Filled 2021-04-10 (×2): qty 1
  Filled 2021-04-10: qty 3
  Filled 2021-04-10 (×3): qty 1

## 2021-04-10 MED ORDER — VANCOMYCIN HCL 1000 MG IV SOLR
INTRAVENOUS | Status: AC
  Filled 2021-04-10: qty 20

## 2021-04-10 MED ORDER — FUROSEMIDE 40 MG PO TABS
120.0000 mg | ORAL_TABLET | Freq: Every day | ORAL | Status: DC
  Administered 2021-04-10 – 2021-04-11 (×2): 120 mg via ORAL
  Filled 2021-04-10 (×2): qty 3

## 2021-04-10 MED ORDER — POLYETHYLENE GLYCOL 3350 17 G PO PACK: 17.0000 g | PACK | Freq: Every day | ORAL | Status: DC | PRN

## 2021-04-10 MED ORDER — ONDANSETRON HCL 4 MG/2ML IJ SOLN
4.0000 mg | Freq: Four times a day (QID) | INTRAMUSCULAR | Status: DC | PRN
Start: 2021-04-10 — End: 2021-04-15
  Administered 2021-04-10: 4 mg via INTRAVENOUS
  Filled 2021-04-10: qty 2

## 2021-04-10 MED ORDER — FENTANYL CITRATE (PF) 250 MCG/5ML IJ SOLN
INTRAMUSCULAR | Status: DC | PRN
  Administered 2021-04-10 (×2): 25 ug via INTRAVENOUS

## 2021-04-10 MED ORDER — ENSURE ENLIVE PO LIQD
237.0000 mL | Freq: Two times a day (BID) | ORAL | Status: DC
  Administered 2021-04-10 – 2021-04-15 (×10): 237 mL via ORAL

## 2021-04-10 MED ORDER — METOPROLOL SUCCINATE ER 25 MG PO TB24
25.0000 mg | ORAL_TABLET | Freq: Every day | ORAL | Status: DC
  Administered 2021-04-10 – 2021-04-15 (×6): 25 mg via ORAL
  Filled 2021-04-10 (×6): qty 1

## 2021-04-10 MED ORDER — FENTANYL CITRATE (PF) 250 MCG/5ML IJ SOLN
INTRAMUSCULAR | Status: AC
  Filled 2021-04-10: qty 5

## 2021-04-10 MED ORDER — ROPIVACAINE HCL 5 MG/ML IJ SOLN
INTRAMUSCULAR | Status: DC | PRN
  Administered 2021-04-10: 15 mL via PERINEURAL
  Administered 2021-04-10: 20 mL via PERINEURAL

## 2021-04-10 MED ORDER — DONEPEZIL HCL 5 MG PO TABS
5.0000 mg | ORAL_TABLET | Freq: Every day | ORAL | Status: DC
  Administered 2021-04-11: 5 mg via ORAL
  Filled 2021-04-10: qty 1

## 2021-04-10 MED ORDER — PHYTONADIONE 1 MG/0.5 ML ORAL SOLUTION
2.0000 mg | Freq: Once | ORAL | Status: AC
  Administered 2021-04-10: 2 mg via ORAL
  Filled 2021-04-10: qty 1

## 2021-04-10 MED ORDER — DOCUSATE SODIUM 100 MG PO CAPS
100.0000 mg | ORAL_CAPSULE | Freq: Two times a day (BID) | ORAL | Status: DC
  Administered 2021-04-10 – 2021-04-15 (×10): 100 mg via ORAL
  Filled 2021-04-10 (×11): qty 1

## 2021-04-10 MED ORDER — ONDANSETRON HCL 4 MG PO TABS: 4.0000 mg | ORAL_TABLET | Freq: Four times a day (QID) | ORAL | Status: DC | PRN

## 2021-04-10 MED ORDER — CEFAZOLIN SODIUM-DEXTROSE 2-4 GM/100ML-% IV SOLN
2.0000 g | Freq: Three times a day (TID) | INTRAVENOUS | Status: AC
  Administered 2021-04-10 – 2021-04-11 (×3): 2 g via INTRAVENOUS
  Filled 2021-04-10 (×3): qty 100

## 2021-04-10 MED ORDER — OXYCODONE HCL 5 MG/5ML PO SOLN: 5.0000 mg | Freq: Once | ORAL | Status: DC | PRN

## 2021-04-10 MED ORDER — OXYCODONE HCL 5 MG PO TABS: 5.0000 mg | ORAL_TABLET | Freq: Once | ORAL | Status: DC | PRN

## 2021-04-10 MED ORDER — PROPOFOL 500 MG/50ML IV EMUL
INTRAVENOUS | Status: DC | PRN
Start: 2021-04-10 — End: 2021-04-10
  Administered 2021-04-10: 80 ug/kg/min via INTRAVENOUS

## 2021-04-10 MED ORDER — METOCLOPRAMIDE HCL 5 MG PO TABS: 5.0000 mg | ORAL_TABLET | Freq: Three times a day (TID) | ORAL | Status: DC | PRN

## 2021-04-10 MED ORDER — PROPOFOL 10 MG/ML IV BOLUS
INTRAVENOUS | Status: AC
  Filled 2021-04-10: qty 20

## 2021-04-10 MED ORDER — CHLORHEXIDINE GLUCONATE 0.12 % MT SOLN
OROMUCOSAL | Status: AC
  Administered 2021-04-10: 15 mL via OROMUCOSAL
  Filled 2021-04-10: qty 15

## 2021-04-10 MED ORDER — 0.9 % SODIUM CHLORIDE (POUR BTL) OPTIME
TOPICAL | Status: DC | PRN
  Administered 2021-04-10: 2000 mL

## 2021-04-10 MED ORDER — LEVOTHYROXINE SODIUM 25 MCG PO TABS
25.0000 ug | ORAL_TABLET | Freq: Every day | ORAL | Status: DC
  Administered 2021-04-11 – 2021-04-15 (×5): 25 ug via ORAL
  Filled 2021-04-10 (×5): qty 1

## 2021-04-10 MED ORDER — CITALOPRAM HYDROBROMIDE 20 MG PO TABS
20.0000 mg | ORAL_TABLET | Freq: Two times a day (BID) | ORAL | Status: DC
  Administered 2021-04-10 – 2021-04-11 (×4): 20 mg via ORAL
  Filled 2021-04-10: qty 1
  Filled 2021-04-10: qty 2
  Filled 2021-04-10 (×2): qty 1

## 2021-04-10 MED ORDER — PROPOFOL 1000 MG/100ML IV EMUL
INTRAVENOUS | Status: AC
  Filled 2021-04-10: qty 100

## 2021-04-10 MED ORDER — METOCLOPRAMIDE HCL 5 MG/ML IJ SOLN: 5.0000 mg | Freq: Three times a day (TID) | INTRAMUSCULAR | Status: DC | PRN

## 2021-04-10 MED ORDER — ONDANSETRON HCL 4 MG/2ML IJ SOLN
4.0000 mg | Freq: Four times a day (QID) | INTRAMUSCULAR | Status: AC | PRN
  Administered 2021-04-10: 4 mg via INTRAVENOUS

## 2021-04-10 MED ORDER — PHYTONADIONE 1 MG/0.5 ML ORAL SOLUTION
1.0000 mg | Freq: Once | ORAL | Status: DC
  Filled 2021-04-10: qty 0.5

## 2021-04-10 SURGICAL SUPPLY — 58 items
APL PRP STRL LF DISP 70% ISPRP (MISCELLANEOUS) ×1
BAG COUNTER SPONGE SURGICOUNT (BAG) ×3 IMPLANT
BAG SPNG CNTER NS LX DISP (BAG) ×1
BIT DRILL CALIBRATED 4.2 (BIT) IMPLANT
BIT DRILL SHORT 4.2 (BIT) IMPLANT
BLADE SURG 10 STRL SS (BLADE) ×6 IMPLANT
BNDG COHESIVE 4X5 TAN STRL (GAUZE/BANDAGES/DRESSINGS) ×3 IMPLANT
BNDG ELASTIC 4X5.8 VLCR STR LF (GAUZE/BANDAGES/DRESSINGS) ×3 IMPLANT
BNDG ELASTIC 6X5.8 VLCR STR LF (GAUZE/BANDAGES/DRESSINGS) ×3 IMPLANT
BRUSH SCRUB EZ PLAIN DRY (MISCELLANEOUS) ×6 IMPLANT
CHLORAPREP W/TINT 26 (MISCELLANEOUS) ×3 IMPLANT
COVER SURGICAL LIGHT HANDLE (MISCELLANEOUS) ×6 IMPLANT
DRAPE C-ARM 42X72 X-RAY (DRAPES) ×3 IMPLANT
DRAPE C-ARMOR (DRAPES) ×3 IMPLANT
DRAPE HALF SHEET 40X57 (DRAPES) ×6 IMPLANT
DRAPE IMP U-DRAPE 54X76 (DRAPES) ×6 IMPLANT
DRAPE INCISE IOBAN 66X45 STRL (DRAPES) IMPLANT
DRAPE ORTHO SPLIT 77X108 STRL (DRAPES) ×4
DRAPE SURG ORHT 6 SPLT 77X108 (DRAPES) ×4 IMPLANT
DRAPE U-SHAPE 47X51 STRL (DRAPES) ×3 IMPLANT
DRILL BIT CALIBRATED 4.2 (BIT) ×2
DRILL BIT SHORT 4.2 (BIT) ×4
DRSG MEPITEL 4X7.2 (GAUZE/BANDAGES/DRESSINGS) ×1 IMPLANT
ELECT REM PT RETURN 9FT ADLT (ELECTROSURGICAL) ×2
ELECTRODE REM PT RTRN 9FT ADLT (ELECTROSURGICAL) ×2 IMPLANT
GAUZE SPONGE 4X4 12PLY STRL (GAUZE/BANDAGES/DRESSINGS) ×2 IMPLANT
GLOVE SURG ENC MOIS LTX SZ6.5 (GLOVE) ×9 IMPLANT
GLOVE SURG ENC MOIS LTX SZ7.5 (GLOVE) ×12 IMPLANT
GLOVE SURG UNDER POLY LF SZ6.5 (GLOVE) ×3 IMPLANT
GLOVE SURG UNDER POLY LF SZ7.5 (GLOVE) ×3 IMPLANT
GOWN STRL REUS W/ TWL LRG LVL3 (GOWN DISPOSABLE) ×4 IMPLANT
GOWN STRL REUS W/TWL LRG LVL3 (GOWN DISPOSABLE) ×4
GUIDEWIRE 3.2X400 (WIRE) ×1 IMPLANT
KIT BASIN OR (CUSTOM PROCEDURE TRAY) ×3 IMPLANT
KIT TURNOVER KIT B (KITS) ×3 IMPLANT
NAIL TIB TFNA 9X300 (Nail) ×1 IMPLANT
PACK TOTAL JOINT (CUSTOM PROCEDURE TRAY) ×3 IMPLANT
PAD ABD 8X10 STRL (GAUZE/BANDAGES/DRESSINGS) ×2 IMPLANT
PAD ARMBOARD 7.5X6 YLW CONV (MISCELLANEOUS) ×6 IMPLANT
PAD CAST 4YDX4 CTTN HI CHSV (CAST SUPPLIES) IMPLANT
PADDING CAST COTTON 4X4 STRL (CAST SUPPLIES) ×2
PADDING CAST COTTON 6X4 STRL (CAST SUPPLIES) ×1 IMPLANT
REAMER ROD 3.8 BALL TIP 3X950 (ORTHOPEDIC DISPOSABLE SUPPLIES) ×1 IMPLANT
SCREW LOCK IM 58X5XLOPRFL NS (Screw) IMPLANT
SCREW LOCK IM NAIL 5X30 (Screw) ×2 IMPLANT
SCREW LOCK IM NAIL 5X58 (Screw) ×2 IMPLANT
SCREW LOCK LP 5X38 LT (Screw) ×1 IMPLANT
STAPLER VISISTAT 35W (STAPLE) ×3 IMPLANT
SUT ETHILON 3 0 PS 1 (SUTURE) ×1 IMPLANT
SUT MNCRL AB 3-0 PS2 18 (SUTURE) ×3 IMPLANT
SUT MON AB 2-0 CT1 36 (SUTURE) ×1 IMPLANT
SUT VIC AB 0 CT1 27 (SUTURE)
SUT VIC AB 0 CT1 27XBRD ANBCTR (SUTURE) IMPLANT
SUT VIC AB 2-0 CT1 27 (SUTURE)
SUT VIC AB 2-0 CT1 TAPERPNT 27 (SUTURE) IMPLANT
TOWEL GREEN STERILE (TOWEL DISPOSABLE) ×6 IMPLANT
TOWEL GREEN STERILE FF (TOWEL DISPOSABLE) ×3 IMPLANT
YANKAUER SUCT BULB TIP NO VENT (SUCTIONS) IMPLANT

## 2021-04-10 NOTE — Progress Notes (Signed)
[facility] PROGRESS NOTE    Progress Note  Amber [name]  EXB:[id number] DOB: [birth year] DOA: 04/09/2021 PCP: Antony Contras, MD     Brief Narrative:   Amber [name] is an 87 y.o. female past medical history significant for advanced combined systolic and diastolic heart failure, chronic kidney disease stage III with a CVA with left residual weakness, chronic atrial fibrillation on Coumadin who is in the hospice for advanced heart failure comes in with open right tib-fib fracture after what appears to be a traumatic fall from lift chair unwitnessed orthopedic surgery was consulted recommended hospital admission, and plan ORIF on the day of admission she had a previous fracture of the right lower extremity with hardware placed on 11/29/2020 removed recently in March 22, 2021    Assessment/Plan:   Acute traumatic Open tibial fracture: Orthopedic surgery was consulted, and the care has been transitioned to Dr. Doreatha Martin for trauma expertise. Awaiting further recommendation. Keep the patient n.p.o.  Acute blood loss anemia/chronic normocytic anemia: Hemoglobin is 10.3, will continue to monitor regularly. Hold Coumadin.  Is trending up, will give low-dose oral vitamin K, have discussed the case with the family we have discussed extensively the risk and benefits and they would like to proceed with the vitamin K.  Chronic kidney disease stage IIIa: Creatinine her creatinine on admission now improved to 0.9 her creatinine appears to be at baseline we will continue to monitor closely.  COPD without exacerbation: Continue current home inhalers.  CVA/CAD with residual left weakness/hypertension/chronic atrial fibrillation on Coumadin: She is currently on anticoagulation which we will hold Coumadin for now, in the setting of traumatic open tib-fib in case surgical intervention is needed. Resuming home regimen as needed.  Advanced vascular dementia: Family at bedside she is at high  risk of delirium sundowning and aspiration pneumonia.  Hypothyroidism: Continue current home regimen.  Depression/anxiety: Resume home regimen postoperatively. RN Pressure Injury Documentation:   Moderate protein caloric malnutrition: Noted    DVT prophylaxis: coumadin Family Communication:daughter Status is: Inpatient Remains inpatient appropriate because: Acute tibial fracture      Code Status:     Code Status Orders  (From admission, onward)           Start     Ordered   04/09/21 1647  Do not attempt resuscitation (DNR)  Continuous       Question Answer Comment  In the event of cardiac or respiratory ARREST Do not call a code blue   In the event of cardiac or respiratory ARREST Do not perform Intubation, CPR, defibrillation or ACLS   In the event of cardiac or respiratory ARREST Use medication by any route, position, wound care, and other measures to relive pain and suffering. May use oxygen, suction and manual treatment of airway obstruction as needed for comfort.      04/09/21 1649           Code Status History     Date Active Date Inactive Code Status Order ID Comments User Context   11/01/2020 1429 11/08/2020 2346 DNR [id number]  Guilford Shi, MD ED   07/24/2019 1714 08/04/2019 2004 DNR [id number]  Patrecia Pour, MD Inpatient   07/23/2019 2356 07/24/2019 1713 Full Code [id number]  Mansy, Arvella Merles, MD ED   04/28/2019 1656 04/30/2019 2027 Full Code [id number]  Georganna Skeans, MD ED   03/04/2017 2337 03/07/2017 2022 DNR [id number]  Rise Patience, MD ED   02/06/2017 1003 02/17/2017 2133 DNR [id number]  Debbe Odea, MD ED  01/02/2017 0042 01/03/2017 1901 Full Code [id number]  Oswald Hillock, MD Inpatient   01/02/2017 0024 01/02/2017 0042 DNR [id number]  Oswald Hillock, MD Inpatient   07/27/2014 1610 07/30/2014 1241 Full Code [id number]  Luanne Bras, MD Inpatient   06/30/2014 1421 07/01/2014 0316 Full Code [id number]  Luanne Bras, MD HOV   02/13/2013 0111  02/19/2013 1418 Full Code [id number]  Toy Baker, MD Inpatient         IV Access:   Peripheral IV   Procedures and diagnostic studies:   DG Chest Portable 1 View  Result Date: 04/09/2021 CLINICAL DATA:  Fall.  Additional history provided: EXAM: PORTABLE CHEST 1 VIEW COMPARISON:  Prior chest radiographs 12/04/2020 and earlier. FINDINGS: Prior median sternotomy. Mild cardiomegaly, unchanged. Aortic atherosclerosis. Small left pleural effusion. Minimal ill-defined opacity within the left lung base. No appreciable airspace consolidation within the right lung. No evidence of pulmonary edema. No evidence of pneumothorax. No acute bony abnormality identified. IMPRESSION: Small left pleural effusion. Minimal ill-defined opacity within the left lung base, which is favored to reflect atelectasis. However, pneumonia is difficult to exclude and clinical correlation is recommended. Mild cardiomegaly, unchanged. Aortic Atherosclerosis (ICD10-I70.0). Electronically Signed   By: Kellie Simmering D.O.   On: 04/09/2021 15:53   DG Tibia/Fibula Right Port  Result Date: 04/09/2021 CLINICAL DATA:  Slipped out of a recliner, RIGHT ankle injury, laceration EXAM: PORTABLE RIGHT TIBIA AND FIBULA - 2 VIEW COMPARISON:  None FINDINGS: Osseous demineralization. Degenerative changes at RIGHT ankle joint due to old healed bimalleolar fractures. Knee joint space narrowing, mild. Comminuted oblique fractures of the mid to distal RIGHT tibial diaphysis, mildly displaced. No additional fracture or dislocation. Pin tracks at distal fibula from prior ORIF. IMPRESSION: Displaced oblique distal RIGHT tibial diaphyseal fractures. Osseous demineralization with old posttraumatic deformities of the medial and lateral malleoli as well as prior hardware removal. Electronically Signed   By: Lavonia Dana M.D.   On: 04/09/2021 15:53     Medical Consultants:   None.   Subjective:    Amber [name] relates her pain is  controlled  Objective:    Vitals:   04/09/21 2300 04/10/21 0100 04/10/21 0200 04/10/21 0400  BP: 135/75 136/67 (!) 149/72 129/65  Pulse: 96 94 98 84  Resp: (!) 23 19 17 19   Temp:      TempSrc:      SpO2: 93% 92% 92% 92%   SpO2: 92 %  No intake or output data in the 24 hours ending 04/10/21 0636 There were no vitals filed for this visit.  Exam: General exam: In no acute distress. Respiratory system: Good air movement and clear to auscultation. Cardiovascular system: S1 & S2 heard, RRR. No JVD. Gastrointestinal system: Abdomen is nondistended, soft and nontender.  Extremities: No pedal edema. Skin: No rashes, lesions or ulcers   Data Reviewed:    Labs: Basic Metabolic Panel: Recent Labs  Lab 04/09/21 1415 04/10/21 0341  NA 144 142  K 4.2 3.7  CL 107 106  CO2 27 24  GLUCOSE 132* 117*  BUN 14 12  CREATININE 1.12* 0.91  CALCIUM 9.0 8.5*   GFR CrCl cannot be calculated (Unknown ideal weight.). Liver Function Tests: Recent Labs  Lab 04/10/21 0341  AST 23  ALT 14  ALKPHOS 47  BILITOT 0.7  PROT 5.4*  ALBUMIN 2.9*   No results for input(s): LIPASE, AMYLASE in the last 168 hours. No results for input(s): AMMONIA in the last 168 hours. Coagulation profile Recent Labs  Lab 04/09/21 1415 04/10/21 0341  INR 2.0* 2.3*   COVID-19 Labs  No results for input(s): DDIMER, FERRITIN, LDH, CRP in the last 72 hours.  Lab Results  Component Value Date   SARSCOV2NAA NEGATIVE 04/09/2021   SARSCOV2NAA NEGATIVE 11/01/2020   [place] NEGATIVE 07/23/2019   [place] NEGATIVE 04/28/2019    CBC: Recent Labs  Lab 04/09/21 1415 04/10/21 0341  WBC 5.6 7.8  HGB 9.2* 10.3*  HCT 28.8* 32.2*  MCV 93.8 92.5  PLT 262 277   Cardiac Enzymes: No results for input(s): CKTOTAL, CKMB, CKMBINDEX, TROPONINI in the last 168 hours. BNP (last 3 results) Recent Labs    09/06/20 1040  PROBNP 5,862*   CBG: No results for input(s): GLUCAP in the last 168  hours. D-Dimer: No results for input(s): DDIMER in the last 72 hours. Hgb A1c: No results for input(s): HGBA1C in the last 72 hours. Lipid Profile: No results for input(s): CHOL, HDL, LDLCALC, TRIG, CHOLHDL, LDLDIRECT in the last 72 hours. Thyroid function studies: No results for input(s): TSH, T4TOTAL, T3FREE, THYROIDAB in the last 72 hours.  Invalid input(s): FREET3 Anemia work up: No results for input(s): VITAMINB12, FOLATE, FERRITIN, TIBC, IRON, RETICCTPCT in the last 72 hours. Sepsis Labs: Recent Labs  Lab 04/09/21 1415 04/10/21 0341  WBC 5.6 7.8   Microbiology Recent Results (from the past 240 hour(s))  Resp Panel by RT-PCR (Flu A&B, Covid) Nasopharyngeal Swab     Status: None   Collection Time: 04/09/21  2:00 PM   Specimen: Nasopharyngeal Swab; Nasopharyngeal(NP) swabs in vial transport medium  Result Value Ref Range Status   SARS Coronavirus 2 by RT PCR NEGATIVE NEGATIVE Final    Comment: (NOTE) SARS-CoV-2 target nucleic acids are NOT DETECTED.  The SARS-CoV-2 RNA is generally detectable in upper respiratory specimens during the acute phase of infection. The lowest concentration of SARS-CoV-2 viral copies this assay can detect is 138 copies/mL. A negative result does not preclude SARS-Cov-2 infection and should not be used as the sole basis for treatment or other patient management decisions. A negative result may occur with  improper specimen collection/handling, submission of specimen other than nasopharyngeal swab, presence of viral mutation(s) within the areas targeted by this assay, and inadequate number of viral copies(<138 copies/mL). A negative result must be combined with clinical observations, patient history, and epidemiological information. The expected result is Negative.  Fact Sheet for Patients:  EntrepreneurPulse.com.au  Fact Sheet for Healthcare Providers:  IncredibleEmployment.be  This test is no t yet  approved or cleared by the [place] FDA and  has been authorized for detection and/or diagnosis of SARS-CoV-2 by FDA under an Emergency Use Authorization (EUA). This EUA will remain  in effect (meaning this test can be used) for the duration of the COVID-19 declaration under Section 564(b)(1) of the Act, 21 U.S.C.section 360bbb-3(b)(1), unless the authorization is terminated  or revoked sooner.       Influenza A by PCR NEGATIVE NEGATIVE Final   Influenza B by PCR NEGATIVE NEGATIVE Final    Comment: (NOTE) The Xpert Xpress SARS-CoV-2/FLU/RSV plus assay is intended as an aid in the diagnosis of influenza from Nasopharyngeal swab specimens and should not be used as a sole basis for treatment. Nasal washings and aspirates are unacceptable for Xpert Xpress SARS-CoV-2/FLU/RSV testing.  Fact Sheet for Patients: EntrepreneurPulse.com.au  Fact Sheet for Healthcare Providers: IncredibleEmployment.be  This test is not yet approved or cleared by the [place] FDA and has been authorized for detection and/or diagnosis of SARS-CoV-2  by FDA under an Emergency Use Authorization (EUA). This EUA will remain in effect (meaning this test can be used) for the duration of the COVID-19 declaration under Section 564(b)(1) of the Act, 21 U.S.C. section 360bbb-3(b)(1), unless the authorization is terminated or revoked.  Performed at [facility], [address], [place], [place] [address]      Medications:    allopurinol  300 mg Oral QHS   chlorhexidine  60 mL Topical Once   citalopram  20 mg Oral BID   levothyroxine  25 mcg Oral Q0600   memantine  10 mg Oral BID   povidone-iodine  2 application Topical Once   Continuous Infusions:   ceFAZolin (ANCEF) IV Stopped (04/10/21 0558)      LOS: 1 day   Charlynne Cousins  [facility]  04/10/2021, 6:36 AM

## 2021-04-10 NOTE — TOC CAGE-AID Note (Addendum)
Transition of Care (TOC) - CAGE-AID Screening ? ? ?Patient Details  ?Name: Amber [name] ?MRN: [id number] ?Date of Birth: 1934-11-02 ? ?Transition of Care (TOC) CM/SW Contact:    ?Jacquelin Krajewski C Tarpley-Carter, LCSWA ?Phone Number: ?04/10/2021, 7:55 AM ? ? ?Clinical Narrative: ?Pt participated in [place].  Per pts daughter she does not use substance or ETOH.  Pt was not offered resources, due to no usage of substance or ETOH.    ? ?Passenger transport manager, MSW, LCSW-A ?Pronouns:  She/Her/Hers ?[facility] ?Clinical Social Worker ?Direct Number:  [phone] ?[email or web address]  ? ? ?CAGE-AID Screening: ?  ? ?Have You Ever Felt You Ought to Cut Down on Your Drinking or Drug Use?: No ?Have People Annoyed You By Critizing Your Drinking Or Drug Use?: No ?Have You Felt Bad Or Guilty About Your Drinking Or Drug Use?: No ?Have You Ever Had a Drink or Used Drugs First Thing In The Morning to Steady Your Nerves or to Get Rid of a Hangover?: No ?CAGE-AID Score: 0 ? ?Substance Abuse Education Offered: No ? ?  ? ? ? ? ? ? ?

## 2021-04-10 NOTE — Progress Notes (Addendum)
Manufacturing engineer [facility]) Hospital Liaison: RN note   ? ?This is a current [facility] Patient with a terminal diagnosis diastolic heart failure. ?[facility] hospital liaison will follow for disposition and coordination of care.   ? ?A Please do not hesitate to call with questions.   ? ?Thank you,   ?Farrel Gordon, RN, CCM      ?[facility] Hospital Liaison   ?[phone] ?

## 2021-04-10 NOTE — Transfer of Care (Signed)
Immediate Anesthesia Transfer of Care Note ? ?Patient: Amber [name] ? ?Procedure(s) Performed: INTRAMEDULLARY (IM) NAIL TIBIAL  AND IRRIGATION AND DEBRIDEMENT (Right) ? ?Patient Location: PACU ? ?Anesthesia Type:MAC and Regional ? ?Level of Consciousness: awake ? ?Airway & Oxygen Therapy: Patient Spontanous Breathing ? ?Post-op Assessment: Report given to RN and Post -op Vital signs reviewed and stable ? ?Post vital signs: Reviewed and stable ? ?Last Vitals:  ?Vitals Value Taken Time  ?BP 137/72 04/10/21 1227  ?Temp    ?Pulse 89 04/10/21 1229  ?Resp 30 04/10/21 1229  ?SpO2 95 % 04/10/21 1229  ?Vitals shown include unvalidated device data. ? ?Last Pain:  ?Vitals:  ? 04/10/21 1003  ?TempSrc: Oral  ?PainSc:   ?   ? ?  ? ?Complications: No notable events documented. ?

## 2021-04-10 NOTE — Anesthesia Preprocedure Evaluation (Signed)
Anesthesia Evaluation  ?Patient identified by MRN, date of birth, ID band ?Patient awake ? ? ? ?Reviewed: ?Allergy & Precautions, H&P , NPO status , Patient's Chart, lab work & pertinent test results ? ?Airway ?Mallampati: II ? ? ?Neck ROM: full ? ? ? Dental ?  ?Pulmonary ?asthma , COPD, former smoker,  ?  ?breath sounds clear to auscultation ? ? ? ? ? ? Cardiovascular ?hypertension, + CAD, + CABG and +CHF  ?+ dysrhythmias  ?Rhythm:regular Rate:Normal ? ?TTE (10/2020): EF 50-55%, moderate AS with mean PG 22 mmHg. ? ?Pt takes plavix and warfarin. ?  ?Neuro/Psych ?PSYCHIATRIC DISORDERS Depression Dementia  Neuromuscular disease CVA   ? GI/Hepatic ?GERD  ,  ?Endo/Other  ?Hypothyroidism  ? Renal/GU ?  ? ?  ?Musculoskeletal ? ?(+) Arthritis , Fibromyalgia - ? Abdominal ?  ?Peds ? Hematology ?  ?Anesthesia Other Findings ? ? Reproductive/Obstetrics ? ?  ? ? ? ? ? ? ? ? ? ? ? ? ? ?  ?  ? ? ? ? ? ? ? ? ?Anesthesia Physical ?Anesthesia Plan ? ?ASA: 3 ? ?Anesthesia Plan: MAC and Regional  ? ?Post-op Pain Management:   ? ?Induction: Intravenous ? ?PONV Risk Score and Plan: 2 and Propofol infusion, Ondansetron and Treatment may vary due to age or medical condition ? ?Airway Management Planned: Simple Face Mask ? ?Additional Equipment:  ? ?Intra-op Plan:  ? ?Post-operative Plan:  ? ?Informed Consent: I have reviewed the patients History and Physical, chart, labs and discussed the procedure including the risks, benefits and alternatives for the proposed anesthesia with the patient or authorized representative who has indicated his/her understanding and acceptance.  ? ? ? ?Dental advisory given ? ?Plan Discussed with: CRNA, Anesthesiologist and Surgeon ? ?Anesthesia Plan Comments:   ? ? ? ? ? ? ?Anesthesia Quick Evaluation ? ?

## 2021-04-10 NOTE — Progress Notes (Signed)
ANTICOAGULATION CONSULT NOTE - Initial Consult ? ?Pharmacy Consult for Warfarin ?Indication: atrial fibrillation ? ?Allergies  ?Allergen Reactions  ? Ezetimibe Other (See Comments)  ?  Myalgia  ? Welchol [Colesevelam Hcl] Other (See Comments)  ?  Muscle aches  ? Adhesive [Tape] Itching, Rash and Other (See Comments)  ?  Burning   ? Ceclor [Cefaclor] Rash  ? Elastic Bandages & [Zinc] Rash and Other (See Comments)  ?  Turns red on the areas it touches  ? Latex Itching, Rash and Other (See Comments)  ?  Burning   ? Lipitor [Atorvastatin Calcium] Other (See Comments)  ?  Increased fibromyalgia pain  ? Mevacor [Lovastatin] Other (See Comments)  ?  Increased fibromyalgia pain  ? Pravachol Other (See Comments)  ?  Increased fibromyalgia pain  ? Vasotec Other (See Comments)  ?  Unknown allergic reaction  ? ? ?Patient Measurements: ?Height: 5\' 5"  (165.1 cm) ?Weight: 61.7 kg (136 lb) ?IBW/kg (Calculated) : 57 ? ?Vital Signs: ?Temp: 97.9 ?F (36.6 ?C) (03/01 1345) ?Temp Source: Oral (03/01 1003) ?BP: 117/68 (03/01 1445) ?Pulse Rate: 120 (03/01 1445) ? ?Labs: ?Recent Labs  ?  04/09/21 ?1415 04/10/21 ?5643  ?HGB 9.2* 10.3*  ?HCT 28.8* 32.2*  ?PLT 262 277  ?LABPROT 22.9* 24.9*  ?INR 2.0* 2.3*  ?CREATININE 1.12* 0.91  ? ? ?Estimated Creatinine Clearance: 41.4 mL/min (by C-G formula based on SCr of 0.91 mg/dL). ? ? ?Medical History: ?Past Medical History:  ?Diagnosis Date  ? Abnormality of gait 08/02/2014  ? Cerebrovascular disease 05/21/2016  ? Cervical spinal stenosis   ? Chronic atrial fibrillation (HCC)   ? a. chronic/rate controlled;  b. chronic coumadin.  ? Chronic combined systolic and diastolic CHF (congestive heart failure) ([place])   ? CKD (chronic kidney disease), stage III ([place])   ? COPD with emphysema ([place])   ? PFT 05/02/10>>FEV1 1.35(62%), FEV1% 66, DLCO 75%  ? Coronary artery disease   ? a. s/p CABG;  b. Abnormal nuc 2015 - managed medically.  ? CVA (cerebral vascular accident) ([place])   ? left sided weakness  ? Depression    ? Fibromyalgia   ? GERD (gastroesophageal reflux disease)   ? pepcid 2-3 times per week  ? Gout   ? Hemiparesis and alteration of sensations as late effects of stroke ([place]) 01/17/2015  ? History of melanoma   ? squamous cell, melanoma  ? Hyperlipidemia   ? a. statin intolerant, not felt to be candidate for PCSK9 due to chronically elevated CK levels.  ? Hypertension   ? Hypertensive heart disease   ? Hypothyroidism   ? Insomnia   ? Memory change 01/23/2014  ? Nasal polyposis   ? Osteoarthritis   ? Osteoporosis   ? Pneumonia   ? 1990  ? Tobacco abuse   ? ?Assessment: ? 87 yr old female on Warfarin prior to admission for atrial fibrillation and hx CVA to continue warfarin s/p I&D and IM nailing for right tibia fracture. Open fracture after fall PTA. ? ?   INR 2.0 on admit 2/28 and 2.3 today.  Vitamin K 2 mg PO x 1 given. ? ?   PTA Warfarin regimen: 5 mg daily. Last dose 2/27 PTA ? ?Goal of Therapy:  ?INR 2-3 ?Monitor platelets by anticoagulation protocol: Yes ?  ?Plan:  ?Warfarin 5 mg x 1 today. Usual dose. ?Daily PT/INR. ?Monitor for effects of Vitamin K dose from this morning. ? ?Arty Baumgartner, RPh ?04/10/2021,3:17 PM ? ? ?

## 2021-04-10 NOTE — Op Note (Signed)
Orthopaedic Surgery Operative Note (CSN: [id number] ) ?Date of Surgery:04/10/2021  ?Admit Date: 04/09/2021  ? ?Diagnoses: ?Pre-Op Diagnoses: ?Type II open tibial shaft fracture ? ?Post-Op Diagnosis: ?Same ? ?Procedures: ?CPT 27759-Intramedullary nailing of right tibia fracture ?CPT 11012-Irrigation and debridement of right open tibia fracture ? ?Surgeons : ?Primary: Shona Needles, MD ? ?Assistant: Patrecia Pace, PA-C ? ?Location: OR 6  ? ?Anesthesia:Regional  ? ?Antibiotics: Ancef 2g preopwtih 1 gm vancomycin powder placed topically  ? ?Tourniquet time:None used   ? ?Estimated Blood Loss:30 mL ? ?Complications:None  ? ?Specimens:None  ? ?Implants: ?Implant Name Type Inv. Item Serial No. Manufacturer Lot No. LRB No. Used Action  ?NAIL TIB TFNA [id number] - [id number] Nail NAIL TIB TFNA [id number]  [employer] [id number] Right 1 Implanted  ?SCREW LOCK LP 5X38 LT - [id number] Screw SCREW LOCK LP 5X38 LT  [employer]  Right 1 Implanted  ?5.0 mm locking screws       Right 2 Implanted  ?5.0 mm locking screw      Right 1 Implanted  ?  ? ?Indications for Surgery: ?87 year old female who sustained a right open tibial shaft fracture.  Due to the unstable nature of her injury and open nature of her injury I recommend proceeding with irrigation debridement with intramedullary nailing.  Risks and benefits were discussed with the patient's daughter.  Risks include but not limited to bleeding, infection, malunion, nonunion, hardware failure, hardware irritation, nerve or blood vessel injury, DVT, even the possibility anesthetic complications.  She agreed to proceed with surgery and consent was obtained. ? ?Operative Findings: ?1.  Irrigation and debridement of right open tibial shaft fracture type II ?2.  Intramedullary nailing of right tibial shaft fracture using [employer] TNA 9 x 300 mm nail with 2 distal interlocking 2 proximal interlocking screws ? ?Procedure: ?The patient was identified in the preoperative holding area. Consent was  confirmed with the patient and their family and all questions were answered. The operative extremity was marked after confirmation with the patient. she was then brought back to the operating room by our anesthesia colleagues.  She was then carefully transferred over to a radiolucent flat top table.  A bump was placed under her operative hip.  The right lower extremity was then prepped and draped in usual sterile fashion.  Timeout was performed to verify the patient, procedure, and the extremity.  Preoperative antibiotics were dosed. ? ?Fluoroscopic imaging showed the unstable nature of her injury.  I opened up the traumatic laceration over her anterior tibia.  I was able to expose the fracture site and used a curette to debride the fracture site.  I then used irrigation to thoroughly irrigate the wound.  Since it was a small wound without contamination I only used 1 L of normal saline.  I then changed gloves and instruments and turned my attention to the nailing portion of the procedure.  Lateral parapatellar incision was then made and carried down through skin and subcutaneous tissue.  I stayed extra-articular but released a portion of the lateral retinaculum to mobilize the patella medially to access the appropriate starting point.  I then placed a threaded guidewire at the appropriate starting point and advanced into the proximal metaphysis.  I then used an entry reamer to enter the medullary canal.  I then passed a ball-tipped guidewire down the center canal and seated it into the distal metaphysis after crossing the fracture. ? ?I measured the length and chose to use a 300 mm nail.  I  then sequentially reamed from 8 mm to 10 mm and chose to place a 9 mm nail.  The nail was then passed down the center canal and alignment of the fracture was appropriate.  I then used perfect circle technique to place 2 medial to lateral distal interlocking screws.  I then used the targeting arm to place 2 proximal interlocking  screws.  The targeting arm was then removed and final fluoroscopic imaging was obtained.  The incisions were copiously irrigated.  A gram of vancomycin powder was placed into the traumatic laceration in the proximal incision.  A layered closure of 0 Vicryl, 2-0 Vicryl 3-0 nylon used to close the surgical incision and 3-0 nylon was used to close the traumatic laceration.  Sterile dressing was applied.  The patient was then awoken from anesthesia and taken to the PACU in stable condition. ?  ?Debridement type: Excisional Debridement ? ?Side: right ? ?Body Location: Tibia ? ?Tools used for debridement: scalpel and curette ? ?Pre-debridement Wound size (cm):   Length: 3        Width: 0.5     Depth: 0.5 ? ?Post-debridement Wound size (cm):  N/A-closed ? ?Debridement depth beyond dead/damaged tissue down to healthy viable tissue: yes ? ?Tissue layer involved: skin, subcutaneous tissue, muscle / fascia, bone ? ?Nature of tissue removed: Devitalized Tissue ? ?Irrigation volume: 1L    ? ?Irrigation fluid type: Normal Saline ? ?Post Op Plan/Instructions: ?Patient will be weightbearing as tolerated to the right lower extremity.  She will receive postoperative Ancef for open fracture prophylaxis.  She will receive Coumadin for her atrial fibrillation and can be started postoperative day 1.  We will have her mobilize with physical and Occupational Therapy ? ?I was present and performed the entire surgery. ? ?Patrecia Pace, PA-C did assist me throughout the case. An assistant was necessary given the difficulty in approach, maintenance of reduction and ability to instrument the fracture. ? ? ?Katha Hamming, MD ?[facility]  ?

## 2021-04-10 NOTE — Progress Notes (Signed)
Transition of Care (TOC) Screening Note ? ? ?Patient Details  ?Name: FIFI SCHINDLER ?Date of Birth: 05/11/1934 ? ? ?Transition of Care [facility]) CM/SW Contact:    ?Sharin Mons, RN ?Phone Number: [phone] ?04/10/2021, 4:17 PM ? ?  -  s/p Intramedullary nailing of right tibia fracture, Irrigation and debridement of right open tibia fracture, 04/10/2021 ?Resides with husband. Daughter [name] lives next door.  Per daughter pt will not d/c to SNF. She will return to home and family will care for pt. Open to home health services. PTA active with Manufacturing engineer for home hospice care, terminal diagnosis diastolic heart failure.  ?Transition of Care Department [facility]) has reviewed patient . PT/OT evals pending...  ?TOC team  will monitor patient advancement through interdisciplinary progression rounds. If new patient transition needs arise, please place a TOC consult. ?Whitman Hero RN.BSN,CM ?  ? ?

## 2021-04-10 NOTE — Interval H&P Note (Signed)
History and Physical Interval Note: ? ?04/10/2021 ?10:01 AM ? ?Amber [name]  has presented today for surgery, with the diagnosis of tibial fracture.  The various methods of treatment have been discussed with the patient and family. After consideration of risks, benefits and other options for treatment, the patient has consented to  Procedure(s): ?INTRAMEDULLARY (IM) NAIL TIBIAL  AND IRRIGATION AND DEBRIDEMENT (Right) as a surgical intervention.  The patient's history has been reviewed, patient examined, no change in status, stable for surgery.  I have reviewed the patient's chart and labs.  Questions were answered to the patient's satisfaction.   ? ? ?Lennette Bihari P Otilia Kareem ? ? ?

## 2021-04-10 NOTE — Anesthesia Procedure Notes (Signed)
Anesthesia Regional Block: Popliteal block  ? ?Pre-Anesthetic Checklist: , timeout performed,  Correct Patient, Correct Site, Correct Laterality,  Correct Procedure, Correct Position, site marked,  Risks and benefits discussed,  Surgical consent,  Pre-op evaluation,  At surgeon's request and post-op pain management ? ?Laterality: Right ? ?Prep: chloraprep     ?  ?Needles:  ?Injection technique: Single-shot ? ?Needle Type: Echogenic Stimulator Needle   ? ? ? ? ? ? ? ?Additional Needles: ? ? ?Procedures:, nerve stimulator,,,,,    ? ?Nerve Stimulator or Paresthesia:  ?Response: plantar flexion of foot, 0.45 mA ? ?Additional Responses:  ? ?Narrative:  ?Start time: 04/10/2021 10:52 AM ?End time: 04/10/2021 10:59 AM ?Injection made incrementally with aspirations every 5 mL. ? ?Performed by: Personally  ?Anesthesiologist: Albertha Ghee, MD ? ?Additional Notes: ?Functioning IV was confirmed and monitors were applied.  A 68mm 21ga Arrow echogenic stimulator needle was used. Sterile prep and drape,hand hygiene and sterile gloves were used.  Negative aspiration and negative test dose prior to incremental administration of local anesthetic. The patient tolerated the procedure well. ? Ultrasound guidance: relevent anatomy identified, needle position confirmed, local anesthetic spread visualized around nerve(s), vascular puncture avoided.  Image printed for medical record.  ? ? ? ? ?

## 2021-04-10 NOTE — Progress Notes (Signed)
[facility] - Manufacturing engineer [facility])  hospitalized hospice patient note. ? ?This is  a current [facility] hospice patient with a diagnosis of of CHF and CKD. She was transported to ED after a fall for fractured leg. Family did notify hospice. She was admitted on 3/1 for a diagnosis of right tibia fracture. Per Dr. Cherie Ouch this is a related hospital admission.  ? ?Visited at bedside post surgery. Patient is awake but still a drowsy from anesthesia. She does not appear in distress and denies pain during visit. Spoke with daughter who was at bedside during visit. ? ?This patient is appropriate for hospital admission due to need for surgery and antibiotics.  ? ?VS: 117/68, 106, 20, 93% 1Lnc  ?I/O: 200/30 ? ?Abnormal labs: ?04/10/21 03:41 ?Glucose: 117 (H) ?Calcium: 8.5 (L) ?Albumin: 2.9 (L) ?Total Protein: 5.4 (L) ?04/10/21 03:41 ?RBC: 3.48 (L) ?Hemoglobin: 10.3 (L) ?HCT: 32.2 (L) ?04/09/21 14:15 ?Prothrombin Time: 22.9 (H) ?INR: 2.0 (H) ? ?Diagnostics: ?Xray Right tibia/fibula ?FINDINGS: ?right tibia intramedullary rod insertion reduces the distal tibia ?shaft displaced spiral type fracture. Proximal and distal fixation ?screws. Improved alignment. Fracture lines remain visible. Inferior ?fibular shaft fracture also noted. Bones are osteopenic. ?Degenerative changes of the ankle. ?  ?IMPRESSION: ?Status post ORIF of the right tibia distal shaft spiral type ?fracture with improved alignment, near anatomic. ? ?IV/PRN medications: LR 69ml/hr, Ancef 2g IVPB TID, Tylenol 650mg  PRN  PO 0241, 0953, 1401, Dulcolax 5mg  PRN @ 0953, 1401, Fentanyl 17mcg IV PRN @ 1401, Norco/Vicodin 5-325 mg PO PRN @ 0953, 1401, Duoneb 0.5-2.5mg  nebulizer PRN @ 0953, 1401, Zofran 4mg  IV PRN @ 1341, Senokot-S 1 tab PO PRN @ 0953, 1401 ? ?Problem list: ?Acute traumatic Open tibial fracture: ?Orthopedic surgery was consulted, and the care has been transitioned to Dr. Doreatha Martin for trauma expertise. ?Awaiting further recommendation. ?Keep the patient  n.p.o. ? ?Acute blood loss anemia/chronic normocytic anemia: ?Hemoglobin is 10.3, will continue to monitor regularly. ?Hold Coumadin.  Is trending up, will give low-dose oral vitamin K, have discussed the case with the family we have discussed extensively the risk and benefits and they would like to proceed with the vitamin K. ? ?Chronic kidney disease stage IIIa: ?Creatinine her creatinine on admission now improved to 0.9 her creatinine appears to be at baseline we will continue to monitor closely. ? ?COPD without exacerbation: ?Continue current home inhalers. ? ?CVA/CAD with residual left weakness/hypertension/chronic atrial fibrillation on Coumadin: ?She is currently on anticoagulation which we will hold Coumadin for now, in the setting of traumatic open tib-fib in case surgical intervention is needed. ?Resuming home regimen as needed. ? ?Advanced vascular dementia: ?Family at bedside she is at high risk of delirium sundowning and aspiration pneumonia. ?  ?Hypothyroidism: ?Continue current home regimen. ? ?Depression/anxiety: ?Resume home regimen postoperatively. ?RN Pressure Injury Documentation: ?Moderate protein caloric malnutrition: ?Noted ? ?Discharge planning: return home with hospice when appropriate for discharge.  ?Family Contact: Spoke with daughter at bedside ?IDG: updated ?Goals of Care: Clear. DNR.  ? ?Medication list and transfer summary placed on [facility] chart. ? ?Should patient need ambulance transport at discharge please use [facility] as [facility] contracts this service with them for our active hospice patients. ? ?A Please do not hesitate to call with questions.   ?Thank you,   ?Farrel Gordon, RN, CCM      ?[facility] Hospital Liaison   ?[phone] ? ?

## 2021-04-10 NOTE — Progress Notes (Signed)
Orthopedic Tech Progress Note ?Patient Details:  ?Amber [name] ?09/18/1934 ?[id number] ? ?Patient ID: [name], female   DOB: [birth year], 87 y.o.   MRN: [id number] ?No OHF due to age restriction. ?[place] ?04/10/2021, 2:26 PM ? ?

## 2021-04-10 NOTE — ED Notes (Signed)
Surgeon at BS 

## 2021-04-10 NOTE — Anesthesia Procedure Notes (Signed)
Procedure Name: [place] ?Date/Time: 04/10/2021 11:10 AM ?Performed by: Erick Colace, CRNA ?Pre-anesthesia Checklist: Patient identified, Emergency Drugs available, Suction available, Timeout performed and Patient being monitored ?Patient Re-evaluated:Patient Re-evaluated prior to induction ?Oxygen Delivery Method: Simple face mask ?Preoxygenation: Pre-oxygenation with 100% oxygen ?Induction Type: IV induction ?Ventilation: Oral airway inserted - appropriate to patient size ? ? ? ? ?

## 2021-04-10 NOTE — Anesthesia Procedure Notes (Signed)
Anesthesia Regional Block: Adductor canal block  ? ?Pre-Anesthetic Checklist: , timeout performed,  Correct Patient, Correct Site, Correct Laterality,  Correct Procedure, Correct Position, site marked,  Risks and benefits discussed,  Surgical consent,  Pre-op evaluation,  At surgeon's request and post-op pain management ? ?Laterality: Right ? ?Prep: chloraprep     ?  ?Needles:  ?Injection technique: Single-shot ? ?Needle Type: Echogenic Needle   ? ? ?Needle Length: 9cm  ?Needle Gauge: 21  ? ? ? ?Additional Needles: ? ? ?Narrative:  ?Start time: 04/10/2021 11:00 AM ?End time: 04/10/2021 11:07 AM ?Injection made incrementally with aspirations every 5 mL. ? ?Performed by: Personally  ?Anesthesiologist: Albertha Ghee, MD ? ?Additional Notes: ?Pt tolerated the procedure well. ? ? ? ? ?

## 2021-04-11 ENCOUNTER — Encounter (HOSPITAL_COMMUNITY): Payer: Self-pay | Admitting: Student

## 2021-04-11 DIAGNOSIS — S82301B Unspecified fracture of lower end of right tibia, initial encounter for open fracture type I or II: Secondary | ICD-10-CM | POA: Diagnosis not present

## 2021-04-11 DIAGNOSIS — D62 Acute posthemorrhagic anemia: Secondary | ICD-10-CM

## 2021-04-11 DIAGNOSIS — I5042 Chronic combined systolic (congestive) and diastolic (congestive) heart failure: Secondary | ICD-10-CM | POA: Diagnosis not present

## 2021-04-11 DIAGNOSIS — S82201B Unspecified fracture of shaft of right tibia, initial encounter for open fracture type I or II: Secondary | ICD-10-CM

## 2021-04-11 DIAGNOSIS — D649 Anemia, unspecified: Secondary | ICD-10-CM

## 2021-04-11 DIAGNOSIS — I10 Essential (primary) hypertension: Secondary | ICD-10-CM

## 2021-04-11 DIAGNOSIS — F01C0 Vascular dementia, severe, without behavioral disturbance, psychotic disturbance, mood disturbance, and anxiety: Secondary | ICD-10-CM

## 2021-04-11 DIAGNOSIS — N1831 Chronic kidney disease, stage 3a: Secondary | ICD-10-CM

## 2021-04-11 LAB — BASIC METABOLIC PANEL
Anion gap: 8 (ref 5–15)
BUN: 12 mg/dL (ref 8–23)
CO2: 28 mmol/L (ref 22–32)
Calcium: 8.1 mg/dL — ABNORMAL LOW (ref 8.9–10.3)
Chloride: 103 mmol/L (ref 98–111)
Creatinine, Ser: 1.02 mg/dL — ABNORMAL HIGH (ref 0.44–1.00)
GFR, Estimated: 54 mL/min — ABNORMAL LOW (ref >60)
Glucose, Bld: 140 mg/dL — ABNORMAL HIGH (ref 70–99)
Potassium: 3.2 mmol/L — ABNORMAL LOW (ref 3.5–5.1)
Sodium: 139 mmol/L (ref 135–145)

## 2021-04-11 LAB — CBC
HCT: 28 % — ABNORMAL LOW (ref 36.0–46.0)
Hemoglobin: 9.2 g/dL — ABNORMAL LOW (ref 12.0–15.0)
MCH: 30 pg (ref 26.0–34.0)
MCHC: 32.9 g/dL (ref 30.0–36.0)
MCV: 91.2 fL (ref 80.0–100.0)
Platelets: 253 10*3/uL (ref 150–400)
RBC: 3.07 MIL/uL — ABNORMAL LOW (ref 3.87–5.11)
RDW: 15.1 % (ref 11.5–15.5)
WBC: 9.6 10*3/uL (ref 4.0–10.5)
nRBC: 0 % (ref 0.0–0.2)

## 2021-04-11 LAB — PROTIME-INR
INR: 2.3 — ABNORMAL HIGH (ref 0.8–1.2)
Prothrombin Time: 25.2 seconds — ABNORMAL HIGH (ref 11.4–15.2)

## 2021-04-11 MED ORDER — TRAMADOL HCL 50 MG PO TABS
50.0000 mg | ORAL_TABLET | Freq: Four times a day (QID) | ORAL | Status: DC | PRN
  Administered 2021-04-12 – 2021-04-15 (×4): 50 mg via ORAL
  Filled 2021-04-11 (×4): qty 1

## 2021-04-11 MED ORDER — ACETAMINOPHEN 325 MG PO TABS
650.0000 mg | ORAL_TABLET | Freq: Four times a day (QID) | ORAL | Status: DC
  Administered 2021-04-11 – 2021-04-15 (×14): 650 mg via ORAL
  Filled 2021-04-11 (×13): qty 2

## 2021-04-11 MED ORDER — WARFARIN SODIUM 5 MG PO TABS
5.0000 mg | ORAL_TABLET | Freq: Every day | ORAL | Status: DC
  Administered 2021-04-11 – 2021-04-12 (×2): 5 mg via ORAL
  Filled 2021-04-11 (×2): qty 1

## 2021-04-11 MED ORDER — POTASSIUM CHLORIDE CRYS ER 20 MEQ PO TBCR
40.0000 meq | EXTENDED_RELEASE_TABLET | Freq: Two times a day (BID) | ORAL | Status: AC
  Administered 2021-04-11 (×2): 40 meq via ORAL
  Filled 2021-04-11 (×2): qty 2

## 2021-04-11 MED ORDER — FUROSEMIDE 10 MG/ML IJ SOLN: 100.0000 mg | Freq: Once | INTRAMUSCULAR | Status: DC

## 2021-04-11 MED ORDER — FUROSEMIDE 10 MG/ML IJ SOLN
100.0000 mg | Freq: Once | INTRAVENOUS | Status: AC
  Administered 2021-04-11: 100 mg via INTRAVENOUS
  Filled 2021-04-11: qty 10

## 2021-04-11 NOTE — Anesthesia Postprocedure Evaluation (Signed)
Anesthesia Post Note ? ?Patient: Amber [name] ? ?Procedure(s) Performed: INTRAMEDULLARY (IM) NAIL TIBIAL  AND IRRIGATION AND DEBRIDEMENT (Right) ? ?  ? ?Patient location during evaluation: PACU ?Anesthesia Type: Regional and MAC ?Level of consciousness: awake and alert ?Pain management: pain level controlled ?Vital Signs Assessment: post-procedure vital signs reviewed and stable ?Respiratory status: spontaneous breathing, nonlabored ventilation, respiratory function stable and patient connected to nasal cannula oxygen ?Cardiovascular status: stable and blood pressure returned to baseline ?Postop Assessment: no apparent nausea or vomiting ?Anesthetic complications: no ? ? ?No notable events documented. ? ?Last Vitals:  ?Vitals:  ? 04/11/21 0417 04/11/21 0805  ?BP: (!) 115/55 131/86  ?Pulse: 86   ?Resp: 20   ?Temp: 36.8 ?C 36.8 ?C  ?SpO2: 90%   ?  ?Last Pain:  ?Vitals:  ? 04/11/21 0417  ?TempSrc: Axillary  ?PainSc:   ? ? ?  ?  ?  ?  ?  ?  ? ?[place] ? ? ? ? ?

## 2021-04-11 NOTE — Evaluation (Signed)
Physical Therapy Evaluation Patient Details Name: Amber [name] MRN: [id number] DOB: [birth year] Today's Date: 04/11/2021  History of Present Illness  Pt is an 87 y.o. female admitted 04/09/21 after sliding out of her lift chair sustaining open R tibial shaft fx. S/p R tibial IMN and I&D 3/1. PMH includes dementia, CVA (residual L-side weakness), CHF, afib, COPD, CKD, fibromyalgia, OA, CAD, L ankle fx s/p ORIF (10/2020).   Clinical Impression  Pt presents with an overall decrease in functional mobility secondary to above. PTA, pt lives with family with daily assist from family, aide and hospice services; pt typically ambulatory with RW and supervision. Today, pt lethargic and pleasantly confused, able to open eyes and answer questions with max cues; pt required maxA+2 to [place] for bed mobility and standing trials. Would recommend post-acute rehab services at SNF, but family plans to have pt return home with continued Hospice services; will require lift equipment, daughter in agreement. Will continue to follow acutely to address established goals.      Recommendations for follow up therapy are one component of a multi-disciplinary discharge planning process, led by the attending physician.  Recommendations may be updated based on patient status, additional functional criteria and insurance authorization.  Follow Up Recommendations Home w/ Hospice; family declines SNF    Assistance Recommended at Discharge Frequent or constant Supervision/Assistance  Patient can return home with the following  Two people to help with walking and/or transfers;Two people to help with bathing/dressing/bathroom;Assistance with cooking/housework;Assistance with feeding;Direct supervision/assist for medications management;Direct supervision/assist for financial management;Assist for transportation;Help with stairs or ramp for entrance    Equipment Recommendations Other (comment) (hoyer lift)  Recommendations for Other  Services       Functional Status Assessment Patient has had a recent decline in their functional status and demonstrates the ability to make significant improvements in function in a reasonable and predictable amount of time.     Precautions / Restrictions Precautions Precautions: Fall;Other (comment) Precaution Comments: L AFO w/ shoe in room Restrictions Weight Bearing Restrictions: Yes RLE Weight Bearing: Weight bearing as tolerated      Mobility  Bed Mobility Overal bed mobility: Needs Assistance Bed Mobility: Supine to Sit, Sit to Supine     Supine to sit: Total assist, +2 for physical assistance, +2 for safety/equipment Sit to supine: Total assist, +2 for physical assistance, +2 for safety/equipment   General bed mobility comments: requires assist for all aspects    Transfers Overall transfer level: Needs assistance Equipment used: Rolling walker (2 wheels) Transfers: Sit to/from Stand Sit to Stand: Max assist, +2 safety/equipment, +2 physical assistance, From elevated surface           General transfer comment: Assisst to boost into standing and assist for balance; performed 2x from elevated bed height, unable to progress to pre-gait activity    Ambulation/Gait                  Stairs            Wheelchair Mobility    Modified Rankin (Stroke Patients Only)       Balance Overall balance assessment: Needs assistance Sitting-balance support: Feet supported Sitting balance-Leahy Scale: Poor Sitting balance - Comments: min - mod A for EOB sitting   Standing balance support: Bilateral upper extremity supported, Reliant on assistive device for balance Standing balance-Leahy Scale: Zero Standing balance comment: Pt requires max A +2  Pertinent Vitals/Pain Pain Assessment Pain Assessment: Faces Faces Pain Scale: Hurts even more Pain Location: RLE Pain Descriptors / Indicators: Grimacing,  Guarding Pain Intervention(s): Limited activity within patient's tolerance, Monitored during session    Home Living Family/patient expects to be discharged to:: Private residence Living Arrangements: Spouse/significant other;Children Available Help at Discharge: Family;Personal care attendant;Available 24 hours/day Type of Home: House Home Access: Ramped entrance       Home Layout: One level Home Equipment: Conservation officer, nature (2 wheels);Shower seat;Wheelchair - manual;Transport chair;Hand held shower head Additional Comments: Lives with husband, daughter next door, also has a Actuary -- all to provide near-24/7 assist with hospice services    Prior Function Prior Level of Function : Needs assist             Mobility Comments: Household ambulator with RW; family provides close supervision due to h/o falls. Transport chair to get into/out of house. Wears L foot brace ADLs Comments: Family assist pt into shower to sit for bathing; hospice sponge bathes 2x/wk, daughter assists with shower 1x/wk. Pt participates in dressing activities with assist     Hand Dominance   Dominant Hand: Right    Extremity/Trunk Assessment   Upper Extremity Assessment Upper Extremity Assessment: Generalized weakness;Difficult to assess due to impaired cognition    Lower Extremity Assessment Lower Extremity Assessment: Generalized weakness;RLE deficits/detail;LLE deficits/detail;Difficult to assess due to impaired cognition RLE Deficits / Details: s/p R tibial IMN, functionally <3/5 noted LLE Deficits / Details: h/o L-side residual weakness from stroke, has L foot AFO    Cervical / Trunk Assessment Cervical / Trunk Assessment: Kyphotic  Communication   Communication: Expressive difficulties  Cognition Arousal/Alertness: Lethargic, Suspect due to medications Behavior During Therapy: Flat affect Overall Cognitive Status: Impaired/Different from baseline Area of Impairment: Attention, Following  commands, Problem solving                   Current Attention Level: Focused   Following Commands: Follows one step commands inconsistently, Follows one step commands with increased time     Problem Solving: Slow processing, Decreased initiation, Difficulty sequencing, Requires verbal cues, Requires tactile cues General Comments: Pt lethargic possible due to medication effect.  She followed one step commands ~50% of time with cues and with a delay.   Daughter reports pt is not at baseline, including pt speaking in high pitched, mumbled voice        General Comments General comments (skin integrity, edema, etc.): Pt's daughter and RN present during session. Discussed d/c planning with daughter who reports family plans to have pt return home with continued hospice services, they are requesting hoyer lift but otherwise have appropriate DME    Exercises     Assessment/Plan    PT Assessment Patient needs continued PT services  PT Problem List Decreased strength;Decreased range of motion;Decreased activity tolerance;Decreased balance;Decreased mobility;Decreased cognition;Decreased knowledge of use of DME;Decreased safety awareness;Decreased knowledge of precautions;Pain       PT Treatment Interventions DME instruction;Gait training;Functional mobility training;Therapeutic activities;Therapeutic exercise;Balance training;Patient/family education;Wheelchair mobility training    PT Goals (Current goals can be found in the Care Plan section)  Acute Rehab PT Goals Patient Stated Goal: have pt return home with hospice and family support PT Goal Formulation: With family Time For Goal Achievement: 04/25/21 Potential to Achieve Goals: Fair    Frequency Min 3X/week     Co-evaluation PT/OT/SLP Co-Evaluation/Treatment: Yes Reason for Co-Treatment: Necessary to address cognition/behavior during functional activity;For patient/therapist safety;To address functional/ADL transfers   OT  goals addressed during session: Strengthening/ROM       AM-PAC PT "6 Clicks" Mobility  Outcome Measure Help needed turning from your back to your side while in a flat bed without using bedrails?: Total Help needed moving from lying on your back to sitting on the side of a flat bed without using bedrails?: Total Help needed moving to and from a bed to a chair (including a wheelchair)?: Total Help needed standing up from a chair using your arms (e.g., wheelchair or bedside chair)?: Total Help needed to walk in hospital room?: Total Help needed climbing 3-5 steps with a railing? : Total 6 Click Score: 6    End of Session Equipment Utilized During Treatment: Gait belt Activity Tolerance: Patient limited by lethargy Patient left: in bed;with call bell/phone within reach;with bed alarm set;with nursing/sitter in room;with family/visitor present Nurse Communication: Mobility status;Need for lift equipment PT Visit Diagnosis: Other abnormalities of gait and mobility (R26.89);Muscle weakness (generalized) (M62.81);Pain Pain - Right/Left: Right Pain - part of body: Leg    Time: 1638-4536 PT Time Calculation (min) (ACUTE ONLY): 25 min   Charges:   PT Evaluation $PT Eval Moderate Complexity: 1 Mod        Mabeline Caras, PT, DPT Acute Rehabilitation Services  Pager [phone] Office [phone]  Derry Lory 04/11/2021, 2:25 PM

## 2021-04-11 NOTE — Evaluation (Signed)
Occupational Therapy Evaluation Patient Details Name: Amber [name] MRN: [id number] DOB: 12-18-1934 Today's Date: 04/11/2021   History of Present Illness Pt is an 87 y.o. female admitted 04/09/21 after sliding out of her lift chair sustaining open R tibial shaft fx. S/p R tibial IMN and I&D 3/1. PMH includes dementia, CVA (residual L-side weakness), CHF, afib, COPD, CKD, fibromyalgia, OA, CAD, L ankle fx s/p ORIF (10/2020).   Clinical Impression   Pt admitted with above. She demonstrates the below listed deficits and will benefit from continued OT to maximize safety and independence with BADLs.  Pt presents to OT with generalized weakness, impaired balance, decreased activity tolerance, pain, impaired cognition.  She currently requires max A - total A for all aspects of ADLs and max A +2 - total A +2 for mobility.   She lives with her spouse with daughters assisting.  She required assist for most ADLs and ambulated with RW and direct supervision/guarding.  Recommend hospital bed, hoyer lift and 24 hour assist at discharge.  Will follow acutely.        Recommendations for follow up therapy are one component of a multi-disciplinary discharge planning process, led by the attending physician.  Recommendations may be updated based on patient status, additional functional criteria and insurance authorization.   Follow Up Recommendations  No OT follow up    Assistance Recommended at Discharge Frequent or constant Supervision/Assistance  Patient can return home with the following Two people to help with walking and/or transfers;Two people to help with bathing/dressing/bathroom;Assistance with feeding;Direct supervision/assist for medications management;Direct supervision/assist for financial management;Assist for transportation;Help with stairs or ramp for entrance;Assistance with cooking/housework    Functional Status Assessment  Patient has had a recent decline in their functional status and  demonstrates the ability to make significant improvements in function in a reasonable and predictable amount of time.  Equipment Recommendations  Hospital bed;Other (comment) (hoyer lift)    Recommendations for Other Services       Precautions / Restrictions Precautions Precautions: Fall;Other (comment) Precaution Comments: L AFO w/ shoe in room Restrictions Weight Bearing Restrictions: Yes RLE Weight Bearing: Weight bearing as tolerated      Mobility Bed Mobility Overal bed mobility: Needs Assistance Bed Mobility: Supine to Sit, Sit to Supine     Supine to sit: Total assist, +2 for physical assistance, +2 for safety/equipment Sit to supine: Total assist, +2 for physical assistance, +2 for safety/equipment   General bed mobility comments: requires assist for all aspects    Transfers Overall transfer level: Needs assistance Equipment used: Rolling walker (2 wheels) Transfers: Sit to/from Stand Sit to Stand: Max assist, +2 safety/equipment, +2 physical assistance           General transfer comment: Assisst to boost into standing and assist for balance      Balance Overall balance assessment: Needs assistance Sitting-balance support: Feet supported Sitting balance-Leahy Scale: Poor Sitting balance - Comments: min - mod A for EOB sitting   Standing balance support: Bilateral upper extremity supported Standing balance-Leahy Scale: Zero Standing balance comment: Pt requires max A +2                           ADL either performed or assessed with clinical judgement   ADL Overall ADL's : Needs assistance/impaired Eating/Feeding: Total assistance   Grooming: Wash/dry hands;Wash/dry face;Maximal assistance;Sitting   Upper Body Bathing: Total assistance;Sitting   Lower Body Bathing: Total assistance;Sit to/from stand;Bed level  Upper Body Dressing : Total assistance;Sitting;Bed level   Lower Body Dressing: Total assistance;Sit to/from stand;Bed  level   Toilet Transfer: Total assistance   Toileting- Clothing Manipulation and Hygiene: Total assistance;Sit to/from stand;Bed level       Functional mobility during ADLs: Maximal assistance;+2 for physical assistance;+2 for safety/equipment;Rolling walker (2 wheels) General ADL Comments: limited by lethargy and impaired cognition     Vision         Perception     Praxis      Pertinent Vitals/Pain Pain Assessment Pain Assessment: Faces Faces Pain Scale: Hurts little more Pain Location: Rt LE Pain Descriptors / Indicators: Operative site guarding Pain Intervention(s): Monitored during session     Hand Dominance Right   Extremity/Trunk Assessment Upper Extremity Assessment Upper Extremity Assessment: Generalized weakness;Difficult to assess due to impaired cognition   Lower Extremity Assessment Lower Extremity Assessment: Generalized weakness;RLE deficits/detail;LLE deficits/detail;Difficult to assess due to impaired cognition RLE Deficits / Details: s/p R tibial IMN, functionally <3/5 noted LLE Deficits / Details: h/o L-side residual weakness from stroke, has L foot AFO   Cervical / Trunk Assessment Cervical / Trunk Assessment: Kyphotic   Communication Communication Communication: Expressive difficulties   Cognition Arousal/Alertness: Lethargic Behavior During Therapy: Flat affect Overall Cognitive Status: Impaired/Different from baseline Area of Impairment: Attention, Following commands, Problem solving                   Current Attention Level: Focused   Following Commands: Follows one step commands inconsistently, Follows one step commands with increased time     Problem Solving: Slow processing, Decreased initiation, Difficulty sequencing, Requires verbal cues, Requires tactile cues General Comments: Pt lethargic possible due to medication effect.  She followed one step commands ~50% of time with cues and with a delay.   Daughter reports pt is not  at baseline     General Comments  Pt's daughter and RN present during session. Discussed d/c planning with daughter who reports family plans to have pt return home with continued hospice services, they are requesting hoyer lift but otherwise have appropriate DME    Exercises     Shoulder Instructions      Home Living Family/patient expects to be discharged to:: Private residence Living Arrangements: Spouse/significant other;Children Available Help at Discharge: Family;Personal care attendant;Available 24 hours/day Type of Home: House Home Access: Ramped entrance     Home Layout: One level     Bathroom Shower/Tub: Occupational psychologist: Handicapped height     Home Equipment: Conservation officer, nature (2 wheels);Shower seat;Wheelchair - manual;Transport chair;Hand held shower head   Additional Comments: Lives with husband, daughter next door, also has a Actuary -- all to provide near-24/7 assist with hospice services      Prior Functioning/Environment Prior Level of Function : Needs assist             Mobility Comments: Household ambulator with RW; family provides close supervision due to h/o falls. Transport chair to get into/out of house. Wears L foot brace ADLs Comments: Family assist pt into shower to sit for bathing; hospice sponge bathes 2x/wk, daughter assists with shower 1x/wk. Pt participates in dressing activities with assist        OT Problem List: Decreased strength;Decreased activity tolerance;Impaired balance (sitting and/or standing);Decreased safety awareness;Decreased knowledge of use of DME or AE;Obesity      OT Treatment/Interventions: Self-care/ADL training;DME and/or AE instruction;Therapeutic activities;Patient/family education;Balance training;Cognitive remediation/compensation    OT Goals(Current goals can be found in the  care plan section) Acute Rehab OT Goals Patient Stated Goal: family's goal is for pt to discharge to home and regain as much  function as possible OT Goal Formulation: With patient/family Time For Goal Achievement: 04/25/21 Potential to Achieve Goals: Good ADL Goals Pt Will Perform Eating: with set-up;with supervision;sitting Pt Will Perform Grooming: with min assist;sitting Pt Will Transfer to Toilet: with mod assist;stand pivot transfer;bedside commode  OT Frequency: Min 2X/week    Co-evaluation PT/OT/SLP Co-Evaluation/Treatment: Yes Reason for Co-Treatment: Necessary to address cognition/behavior during functional activity;For patient/therapist safety;To address functional/ADL transfers   OT goals addressed during session: Strengthening/ROM      AM-PAC OT "6 Clicks" Daily Activity     Outcome Measure Help from another person eating meals?: Total Help from another person taking care of personal grooming?: A Lot Help from another person toileting, which includes using toliet, bedpan, or urinal?: Total Help from another person bathing (including washing, rinsing, drying)?: Total Help from another person to put on and taking off regular upper body clothing?: Total Help from another person to put on and taking off regular lower body clothing?: Total 6 Click Score: 7   End of Session Equipment Utilized During Treatment: Gait belt;Rolling walker (2 wheels) Nurse Communication: Mobility status  Activity Tolerance: Patient limited by lethargy Patient left: in bed;with call bell/phone within reach;with family/visitor present;with nursing/sitter in room  OT Visit Diagnosis: Unsteadiness on feet (R26.81);Cognitive communication deficit (R41.841)                Time: 1010-1039 OT Time Calculation (min): 29 min Charges:  OT General Charges $OT Visit: 1 Visit OT Evaluation $OT Eval Moderate Complexity: 1 Mod  Nilsa Nutting., OTR/L Acute Rehabilitation Services Pager [phone] Office [place], [place] 04/11/2021, 2:38 PM

## 2021-04-11 NOTE — Progress Notes (Signed)
[facility] - [employer] [facility])  hospitalized hospice patient note. ?  ?This is  a current [facility] hospice patient with a diagnosis of of CHF and CKD. She was transported to ED after a fall for fractured leg. Family did notify hospice. She was admitted on 3/1 for a diagnosis of right tibia fracture. Per Dr. Cherie Ouch this is a related hospital admission.  ? ?Visited at bedside with daughter present. Patient is very drowsy today. She will respond to voice but then closes eyes. She does not appear in distress. Daughter states she is not eating or drinking today. Received report from RN. ? ?This patient is appropriate for hospital admission due to need for surgery, symptom management and IV medications.  ? ?VS: 98.3, 131/86, 86, 20, 90% RA. ?I/O: 770.3/530 ? ?Abnormal labs:  ?04/11/21 03:00 ?Potassium: 3.2 (L) ?Glucose: 140 (H) ?Creatinine: 1.02 (H) ?Calcium: 8.1 (L) ?04/11/21 03:00 ?GFR, Estimated: 54 (L) ?RBC: 3.07 (L) ?Hemoglobin: 9.2 (L) ?HCT: 28.0 (L) ?Prothrombin Time: 25.2 (H) ?INR: 2.3 (H) ? ?IV/PRN medications: Lasix 100mg  IVPB, LR @ KVO. ? ?Problem list: ?Acute traumatic Open tibial fracture: ?Orthopedic surgery was consulted she is status post intramedullary nailing of the tibia on the right with irrigation and debridement on 04/10/2021 ?There was mild drop in hemoglobin from 10-9.2 we will continue to monitor hemoglobin closely. ?Orthopedic surgery recommended weightbearing as tolerated. ?Consult physical therapy. ? ?Acute blood loss anemia/chronic normocytic anemia: ?Hemoglobin is 10.3, will continue to monitor regularly. ?Surprisingly her hemoglobin has remained stable at 9.2, continue to hold Coumadin her INR is 2.3.. ?Orthopedic surgery to dictate when to restart Coumadin ? ?Chronic kidney disease stage IIIa: ?Her baseline creatinine is around 0.9-1.2, her creatinine appears to be at baseline we will continue to monitor closely. ?  ?Advanced combined chronic diastolic heart failure: ?Resume her  Lasix, Lasix she appears fluid overloaded on physical exam. ?JVD to the earlobe ? ?COPD without exacerbation: ?Continue current home inhalers. ?She is currently satting greater 90% on 1 L of oxygen. ?Try to wean to room air.  Goal oxygen sat greater than 88% ? ?CVA/CAD with residual left weakness/hypertension/chronic atrial fibrillation on Coumadin: ?Orthopedic surgery to dictate when to restart Coumadin. ? ?Advanced vascular dementia: ?Patient is sleeping this morning, family at bedside she is at high risk of delirium sundowning and aspiration pneumonia. ?  ?Hypothyroidism: ?Continue current home regimen. ?  ?Small pleural effusion on the left: ?Likely due to hypoalbuminemia in the setting of fluid overload. ?Only requiring 1 L of oxygen to keep saturations greater than 88%. ? ?Depression/anxiety: ?Resume home regimen postoperatively. ?  ?Moderate protein caloric malnutrition: ?Noted ?  ?Hypokalemia: ?Replete orally recheck tomorrow morning. ?  ?Discharge planning: return home with hospice when appropriate for discharge.  ?Family Contact: Spoke with daughter at bedside ?IDG: updated ?Goals of Care: Clear. DNR.  ? ?Should patient need ambulance transport at discharge please use [facility] as [facility] contracts this service with them for our active hospice patients. ?  ?A Please do not hesitate to call with questions.   ?Thank you,   ?Farrel Gordon, RN, CCM      ?[facility] Liaison   ?[phone] ?  ? ? ?

## 2021-04-11 NOTE — Progress Notes (Signed)
Orthopaedic Trauma Progress Note ? ?SUBJECTIVE: Doing okay this morning, very drowsy.  Received hydrocodone overnight for pain, this is likely contributing to sleepiness this morning.  Does open eyes but not very involved in conversation.  Daughter at bedside.  I discussed with her that the patient's drowsiness today is likely related to lack of sleep while in the emergency department in combination with surgery and postoperative pain medications.  She is hopeful patient will be able to get up with therapies today. ? ?OBJECTIVE:  ?Vitals:  ? 04/11/21 0417 04/11/21 0805  ?BP: (!) 115/55 131/86  ?Pulse: 86   ?Resp: 20   ?Temp: 98.2 ?F (36.8 ?C) 98.3 ?F (36.8 ?C)  ?SpO2: 90%   ? ? ?General: Resting in bed comfortably, no acute distress ?Respiratory: No increased work of breathing.  ?RLE: Dressings CDI.  Tender with palpation throughout lower leg.  Tolerates minimal near ankle motion secondary to pain.  Difficulty obtaining reliable motor or sensory exam due to patient's drowsiness.  Toes warm and well-perfused. ? ?IMAGING: Stable post op imaging.  ? ?LABS:  ?Results for orders placed or performed during the hospital encounter of 04/09/21 (from the past 24 hour(s))  ?Basic metabolic panel     Status: Abnormal  ? Collection Time: 04/11/21  3:00 AM  ?Result Value Ref Range  ? Sodium 139 135 - 145 mmol/L  ? Potassium 3.2 (L) 3.5 - 5.1 mmol/L  ? Chloride 103 98 - 111 mmol/L  ? CO2 28 22 - 32 mmol/L  ? Glucose, Bld 140 (H) 70 - 99 mg/dL  ? BUN 12 8 - 23 mg/dL  ? Creatinine, Ser 1.02 (H) 0.44 - 1.00 mg/dL  ? Calcium 8.1 (L) 8.9 - 10.3 mg/dL  ? GFR, Estimated 54 (L) >60 mL/min  ? Anion gap 8 5 - 15  ?CBC     Status: Abnormal  ? Collection Time: 04/11/21  3:00 AM  ?Result Value Ref Range  ? WBC 9.6 4.0 - 10.5 K/uL  ? RBC 3.07 (L) 3.87 - 5.11 MIL/uL  ? Hemoglobin 9.2 (L) 12.0 - 15.0 g/dL  ? HCT 28.0 (L) 36.0 - 46.0 %  ? MCV 91.2 80.0 - 100.0 fL  ? MCH 30.0 26.0 - 34.0 pg  ? MCHC 32.9 30.0 - 36.0 g/dL  ? RDW 15.1 11.5 - 15.5 %  ?  Platelets 253 150 - 400 K/uL  ? nRBC 0.0 0.0 - 0.2 %  ?Protime-INR     Status: Abnormal  ? Collection Time: 04/11/21  3:00 AM  ?Result Value Ref Range  ? Prothrombin Time 25.2 (H) 11.4 - 15.2 seconds  ? INR 2.3 (H) 0.8 - 1.2  ? ?*Note: Due to a large number of results and/or encounters for the requested time period, some results have not been displayed. A complete set of results can be found in Results Review.  ? ? ?ASSESSMENT: Amber [name] is a 87 y.o. female, 1 Day Post-Op s/p ?INTRAMEDULLARY NAIL RIGHT TIBIA ?IRRIGATION AND DEBRIDEMENT RIGHT LEG ? ?CV/Blood loss: Acute blood loss anemia, Hgb 9.2 this AM. ? ?PLAN: ?Weightbearing: WBAT RLE ?ROM: ok for gentle knee and ankle motion as tolerated  ?Incisional and dressing care:  Plan to change dressing 04/12/21 ?Showering: Hold off for now. [place] for bed bath ?Orthopedic device(s): None  ?Pain management: Limit use of narcotics ?1. Tylenol 650 mg q 6 hours scheduled ?2. Tramadol 50 mg q 6 hours PRN ?3. Norco 5-325 mg q 4 hours PRN ?4. Fentanyl 12.5-50 mcg q 2  hours PRN ?VTE prophylaxis: Coumadin, SCDs ?ID:  Ancef 2gm post op per open fracture protocol ?Foley/Lines:  No foley, KVO IVFs ?Impediments to Fracture Healing: Open fracture. Vit D level pending, will start supplementation as indicated ?Dispo: PT/OT eval today, daughter would like to take patient home.  In terms of pain management, we will stick with scheduled Tylenol and avoid tramadol and hydrocodone today to see if this improves patient's drowsiness and allows her to participate more with therapies.  Will plan to change RLE dressing tomorrow.   ? ?D/C recommendations: ?-Tramadol for pain control ?- Possible need for Vit D supplementation ? ?Follow - up plan: 2 weeks after discharge for repeat x-rays and wound check ? ? ?Contact information:  Katha Hamming MD, Rushie Nyhan PA-C. After hours and holidays please check [email or web address] for group call information for [facility] ? ? ?Amber Passe, PA-C ?[phone] (office) ?[email or web address] ? ? ? ?

## 2021-04-11 NOTE — Progress Notes (Signed)
ANTICOAGULATION CONSULT NOTE - Follow Up Consult ? ?Pharmacy Consult for Warfarin ?Indication: atrial fibrillation ? ?Patient Measurements: ?Height: 5\' 5"  (165.1 cm) ?Weight: 61.7 kg (136 lb) ?IBW/kg (Calculated) : 57 ? ?Vital Signs: ?Temp: 98.3 ?F (36.8 ?C) (03/02 0805) ?Temp Source: Axillary (03/02 0417) ?BP: 131/86 (03/02 0805) ?Pulse Rate: 86 (03/02 0417) ? ?Labs: ?Recent Labs  ?  04/09/21 ?1415 04/10/21 ?4599 04/11/21 ?0300  ?HGB 9.2* 10.3* 9.2*  ?HCT 28.8* 32.2* 28.0*  ?PLT 262 277 253  ?LABPROT 22.9* 24.9* 25.2*  ?INR 2.0* 2.3* 2.3*  ?CREATININE 1.12* 0.91 1.02*  ? ? ?Estimated Creatinine Clearance: 36.9 mL/min (A) (by C-G formula based on SCr of 1.02 mg/dL (H)). ? ?Assessment: ?87 yr old female on Warfarin prior to admission for atrial fibrillation and hx CVA to continue warfarin s/p I&D and IM nailing for right tibia fracture. Open fracture after fall PTA. ?  ?   INR 2.0 on admit 2/28 and 2.3 on 3/1.  Vitamin K 2 mg PO x 1 given on 3/1 am.  Warfarin resumed post-op with 5 mg on 3/1 and INR remains therapeutic at 2.3.  ?  ?   PTA Warfarin regimen: 5 mg daily ?  ?Goal of Therapy:  ?INR 2-3 ?Monitor platelets by anticoagulation protocol: Yes ?  ?Plan:  ?Continue Warfarin 5 mg daily at 4pm. ?Daily PT/INR ?Monitor for effects from Vitamin K dose on 3/1. ? ?Arty Baumgartner, RPh ?04/11/2021,12:20 PM ? ? ?

## 2021-04-11 NOTE — Progress Notes (Addendum)
[facility] PROGRESS NOTE    Progress Note  GERTRUE WILLETTE  WSF:[id number] DOB: [birth year] DOA: 04/09/2021 PCP: Antony Contras, MD     Brief Narrative:   Amber [name] is an 87 y.o. female past medical history significant for advanced combined systolic and diastolic heart failure, chronic kidney disease stage III with a CVA with left residual weakness, chronic atrial fibrillation on Coumadin who is in the hospice for advanced heart failure comes in with open right tib-fib fracture after what appears to be a traumatic fall from lift chair unwitnessed orthopedic surgery was consulted recommended hospital admission, and plan ORIF on the day of admission she had a previous fracture of the right lower extremity with hardware placed on 11/29/2020 removed recently in March 22, 2021    Assessment/Plan:   Acute traumatic Open tibial fracture: Orthopedic surgery was consulted she is status post intramedullary nailing of the tibia on the right with irrigation and debridement on 04/10/2021 There was mild drop in hemoglobin from 10-9.2 we will continue to monitor hemoglobin closely. Orthopedic surgery recommended weightbearing as tolerated. Consult physical therapy.  Acute blood loss anemia/chronic normocytic anemia: Hemoglobin is 10.3, will continue to monitor regularly. Surprisingly her hemoglobin has remained stable at 9.2, continue to hold Coumadin her INR is 2.3.. Orthopedic surgery to dictate when to restart Coumadin  Chronic kidney disease stage IIIa: Her baseline creatinine is around 0.9-1.2, her creatinine appears to be at baseline we will continue to monitor closely.  Advanced combined chronic diastolic heart failure: Resume her Lasix, Lasix she appears fluid overloaded on physical exam. JVD to the earlobe  COPD without exacerbation: Continue current home inhalers. She is currently satting greater 90% on 1 L of oxygen. Try to wean to room air.  Goal oxygen sat greater than  88%  CVA/CAD with residual left weakness/hypertension/chronic atrial fibrillation on Coumadin: Orthopedic surgery to dictate when to restart Coumadin.  Advanced vascular dementia: Patient is sleeping this morning, family at bedside she is at high risk of delirium sundowning and aspiration pneumonia.  Hypothyroidism: Continue current home regimen.  Small pleural effusion on the left: Likely due to hypoalbuminemia in the setting of fluid overload. Only requiring 1 L of oxygen to keep saturations greater than 88%.  Depression/anxiety: Resume home regimen postoperatively.  Moderate protein caloric malnutrition: Noted  Hypokalemia: Replete orally recheck tomorrow morning.    DVT prophylaxis: coumadin Family Communication:daughter Status is: Inpatient Remains inpatient appropriate because: Acute tibial fracture      Code Status:     Code Status Orders  (From admission, onward)           Start     Ordered   04/09/21 1647  Do not attempt resuscitation (DNR)  Continuous       Question Answer Comment  In the event of cardiac or respiratory ARREST Do not call a code blue   In the event of cardiac or respiratory ARREST Do not perform Intubation, CPR, defibrillation or ACLS   In the event of cardiac or respiratory ARREST Use medication by any route, position, wound care, and other measures to relive pain and suffering. May use oxygen, suction and manual treatment of airway obstruction as needed for comfort.      04/09/21 1649           Code Status History     Date Active Date Inactive Code Status Order ID Comments User Context   11/01/2020 1429 11/08/2020 2346 DNR [id number]  Guilford Shi, MD ED   07/24/2019  1714 08/04/2019 2004 DNR [id number]  Patrecia Pour, MD Inpatient   07/23/2019 2356 07/24/2019 1713 Full Code [id number]  Mansy, Arvella Merles, MD ED   04/28/2019 1656 04/30/2019 2027 Full Code [id number]  Georganna Skeans, MD ED   03/04/2017 2337 03/07/2017 2022 DNR [id number]   Rise Patience, MD ED   02/06/2017 1003 02/17/2017 2133 DNR [id number]  Debbe Odea, MD ED   01/02/2017 0042 01/03/2017 1901 Full Code [id number]  Oswald Hillock, MD Inpatient   01/02/2017 0024 01/02/2017 0042 DNR [id number]  Oswald Hillock, MD Inpatient   07/27/2014 1610 07/30/2014 1241 Full Code [id number]  Luanne Bras, MD Inpatient   06/30/2014 1421 07/01/2014 0316 Full Code [id number]  Luanne Bras, MD HOV   02/13/2013 0111 02/19/2013 1418 Full Code [id number]  Toy Baker, MD Inpatient         IV Access:   Peripheral IV   Procedures and diagnostic studies:   DG Tibia/Fibula Right  Result Date: 04/10/2021 CLINICAL DATA:  Right tibial nail placement.  Operative fluoroscopy. EXAM: RIGHT TIBIA AND FIBULA - 2 VIEW COMPARISON:  04/09/2021 FINDINGS: Images were performed intraoperatively without the presence of a radiologist. Patient appears to be undergoing intramedullary nail fixation of the previously seen comminuted distal tibial fracture. Total fluoroscopy time: 1 minute 9 seconds Total dose: 1.73 mGy Total fluoro images: 5 Please see intraoperative findings for further detail. IMPRESSION: Operative fluoroscopy for intramedullary nail fixation of the tibia. Electronically Signed   By: Yvonne Kendall M.D.   On: 04/10/2021 12:44   DG Chest Portable 1 View  Result Date: 04/09/2021 CLINICAL DATA:  Fall.  Additional history provided: EXAM: PORTABLE CHEST 1 VIEW COMPARISON:  Prior chest radiographs 12/04/2020 and earlier. FINDINGS: Prior median sternotomy. Mild cardiomegaly, unchanged. Aortic atherosclerosis. Small left pleural effusion. Minimal ill-defined opacity within the left lung base. No appreciable airspace consolidation within the right lung. No evidence of pulmonary edema. No evidence of pneumothorax. No acute bony abnormality identified. IMPRESSION: Small left pleural effusion. Minimal ill-defined opacity within the left lung base, which is favored to reflect atelectasis.  However, pneumonia is difficult to exclude and clinical correlation is recommended. Mild cardiomegaly, unchanged. Aortic Atherosclerosis (ICD10-I70.0). Electronically Signed   By: Kellie Simmering D.O.   On: 04/09/2021 15:53   DG Tibia/Fibula Right Port  Result Date: 04/10/2021 CLINICAL DATA:  Tibia fibular fracture, postoperative exam EXAM: PORTABLE RIGHT TIBIA AND FIBULA - 2 VIEW COMPARISON:  04/10/2021 FINDINGS: right tibia intramedullary rod insertion reduces the distal tibia shaft displaced spiral type fracture. Proximal and distal fixation screws. Improved alignment. Fracture lines remain visible. Inferior fibular shaft fracture also noted. Bones are osteopenic. Degenerative changes of the ankle. IMPRESSION: Status post ORIF of the right tibia distal shaft spiral type fracture with improved alignment, near anatomic. Electronically Signed   By: Jerilynn Mages.  Shick M.D.   On: 04/10/2021 13:44   DG Tibia/Fibula Right Port  Result Date: 04/09/2021 CLINICAL DATA:  Slipped out of a recliner, RIGHT ankle injury, laceration EXAM: PORTABLE RIGHT TIBIA AND FIBULA - 2 VIEW COMPARISON:  None FINDINGS: Osseous demineralization. Degenerative changes at RIGHT ankle joint due to old healed bimalleolar fractures. Knee joint space narrowing, mild. Comminuted oblique fractures of the mid to distal RIGHT tibial diaphysis, mildly displaced. No additional fracture or dislocation. Pin tracks at distal fibula from prior ORIF. IMPRESSION: Displaced oblique distal RIGHT tibial diaphyseal fractures. Osseous demineralization with old posttraumatic deformities of the medial and lateral malleoli as well as prior hardware removal. Electronically Signed  By: Lavonia Dana M.D.   On: 04/09/2021 15:53   DG C-Arm 1-60 Min-No Report  Result Date: 04/10/2021 Fluoroscopy was utilized by the requesting physician.  No radiographic interpretation.     Medical Consultants:   None.   Subjective:    Kamyia Thomason Ferrie sleepy this morning  Objective:     Vitals:   04/10/21 1500 04/10/21 1700 04/10/21 1946 04/11/21 0417  BP:  (!) 146/82 (!) 154/74 (!) 115/55  Pulse:  99 87 86  Resp:  (!) 22 (!) 21 20  Temp:   98 F (36.7 C) 98.2 F (36.8 C)  TempSrc:   Oral Axillary  SpO2:  94% 90% 90%  Weight:      Height: 5\' 5"  (1.651 m)      SpO2: 90 % O2 Flow Rate (L/min): 1 L/min   Intake/Output Summary (Last 24 hours) at 04/11/2021 5009 Last data filed at 04/11/2021 0419 Gross per 24 hour  Intake 770.33 ml  Output 530 ml  Net 240.33 ml   Filed Weights   04/10/21 1003  Weight: 61.7 kg    Exam: General exam: In no acute distress, comfortable cachectic Respiratory system: Deferred as the patient was sleeping Cardiovascular system: S1 & S2 heard, RRR.  Positive JVD Gastrointestinal system: Abdomen is nondistended, soft and nontender.  Extremities: No pedal edema. Skin: No  lesions or ulcers   Data Reviewed:    Labs: Basic Metabolic Panel: Recent Labs  Lab 04/09/21 1415 04/10/21 0341 04/11/21 0300  NA 144 142 139  K 4.2 3.7 3.2*  CL 107 106 103  CO2 27 24 28   GLUCOSE 132* 117* 140*  BUN 14 12 12   CREATININE 1.12* 0.91 1.02*  CALCIUM 9.0 8.5* 8.1*    GFR Estimated Creatinine Clearance: 36.9 mL/min (A) (by C-G formula based on SCr of 1.02 mg/dL (H)). Liver Function Tests: Recent Labs  Lab 04/10/21 0341  AST 23  ALT 14  ALKPHOS 47  BILITOT 0.7  PROT 5.4*  ALBUMIN 2.9*    No results for input(s): LIPASE, AMYLASE in the last 168 hours. No results for input(s): AMMONIA in the last 168 hours. Coagulation profile Recent Labs  Lab 04/09/21 1415 04/10/21 0341 04/11/21 0300  INR 2.0* 2.3* 2.3*    COVID-19 Labs  No results for input(s): DDIMER, FERRITIN, LDH, CRP in the last 72 hours.  Lab Results  Component Value Date   SARSCOV2NAA NEGATIVE 04/09/2021   SARSCOV2NAA NEGATIVE 11/01/2020   [place] NEGATIVE 07/23/2019   [place] NEGATIVE 04/28/2019    CBC: Recent Labs  Lab 04/09/21 1415  04/10/21 0341 04/11/21 0300  WBC 5.6 7.8 9.6  HGB 9.2* 10.3* 9.2*  HCT 28.8* 32.2* 28.0*  MCV 93.8 92.5 91.2  PLT 262 277 253    Cardiac Enzymes: No results for input(s): CKTOTAL, CKMB, CKMBINDEX, TROPONINI in the last 168 hours. BNP (last 3 results) Recent Labs    09/06/20 1040  PROBNP 5,862*    CBG: No results for input(s): GLUCAP in the last 168 hours. D-Dimer: No results for input(s): DDIMER in the last 72 hours. Hgb A1c: No results for input(s): HGBA1C in the last 72 hours. Lipid Profile: No results for input(s): CHOL, HDL, LDLCALC, TRIG, CHOLHDL, LDLDIRECT in the last 72 hours. Thyroid function studies: No results for input(s): TSH, T4TOTAL, T3FREE, THYROIDAB in the last 72 hours.  Invalid input(s): FREET3 Anemia work up: No results for input(s): VITAMINB12, FOLATE, FERRITIN, TIBC, IRON, RETICCTPCT in the last 72 hours. Sepsis Labs: Recent Labs  Lab 04/09/21 1415 04/10/21 0341 04/11/21 0300  WBC 5.6 7.8 9.6    Microbiology Recent Results (from the past 240 hour(s))  Resp Panel by RT-PCR (Flu A&B, Covid) Nasopharyngeal Swab     Status: None   Collection Time: 04/09/21  2:00 PM   Specimen: Nasopharyngeal Swab; Nasopharyngeal(NP) swabs in vial transport medium  Result Value Ref Range Status   SARS Coronavirus 2 by RT PCR NEGATIVE NEGATIVE Final    Comment: (NOTE) SARS-CoV-2 target nucleic acids are NOT DETECTED.  The SARS-CoV-2 RNA is generally detectable in upper respiratory specimens during the acute phase of infection. The lowest concentration of SARS-CoV-2 viral copies this assay can detect is 138 copies/mL. A negative result does not preclude SARS-Cov-2 infection and should not be used as the sole basis for treatment or other patient management decisions. A negative result may occur with  improper specimen collection/handling, submission of specimen other than nasopharyngeal swab, presence of viral mutation(s) within the areas targeted by this  assay, and inadequate number of viral copies(<138 copies/mL). A negative result must be combined with clinical observations, patient history, and epidemiological information. The expected result is Negative.  Fact Sheet for Patients:  EntrepreneurPulse.com.au  Fact Sheet for Healthcare Providers:  IncredibleEmployment.be  This test is no t yet approved or cleared by the [place] FDA and  has been authorized for detection and/or diagnosis of SARS-CoV-2 by FDA under an Emergency Use Authorization (EUA). This EUA will remain  in effect (meaning this test can be used) for the duration of the COVID-19 declaration under Section 564(b)(1) of the Act, 21 U.S.C.section 360bbb-3(b)(1), unless the authorization is terminated  or revoked sooner.       Influenza A by PCR NEGATIVE NEGATIVE Final   Influenza B by PCR NEGATIVE NEGATIVE Final    Comment: (NOTE) The Xpert Xpress SARS-CoV-2/FLU/RSV plus assay is intended as an aid in the diagnosis of influenza from Nasopharyngeal swab specimens and should not be used as a sole basis for treatment. Nasal washings and aspirates are unacceptable for Xpert Xpress SARS-CoV-2/FLU/RSV testing.  Fact Sheet for Patients: EntrepreneurPulse.com.au  Fact Sheet for Healthcare Providers: IncredibleEmployment.be  This test is not yet approved or cleared by the [place] FDA and has been authorized for detection and/or diagnosis of SARS-CoV-2 by FDA under an Emergency Use Authorization (EUA). This EUA will remain in effect (meaning this test can be used) for the duration of the COVID-19 declaration under Section 564(b)(1) of the Act, 21 U.S.C. section 360bbb-3(b)(1), unless the authorization is terminated or revoked.  Performed at [facility], [place] [address], [place], [place] [address]      Medications:    allopurinol  300 mg Oral QHS   citalopram  20 mg Oral BID    docusate sodium  100 mg Oral BID   donepezil  5 mg Oral Daily   feeding supplement  237 mL Oral BID BM   furosemide  120 mg Oral Daily   levothyroxine  25 mcg Oral Q0600   memantine  10 mg Oral BID   metoprolol succinate  25 mg Oral Daily   Warfarin - Pharmacist Dosing Inpatient   Does not apply q1600   Continuous Infusions:   ceFAZolin (ANCEF) IV 2 g (04/11/21 0141)   lactated ringers 10 mL/hr at 04/11/21 0301      LOS: 2 days   Charlynne Cousins  [facility]  04/11/2021, 7:14 AM

## 2021-04-11 NOTE — Progress Notes (Signed)
Initial Nutrition Assessment ? ?DOCUMENTATION CODES:  ? ?Not applicable ? ?INTERVENTION:  ? ?- Liberalize diet to Regular, verbal with readback order placed per MD ? ?- Continue Ensure Enlive po BID, each supplement provides 350 kcal and 20 grams of protein ? ?- Add Mighty Shake po TID with meals, each supplement provides 330 kcal and 9 grams of protein ? ?- Encourage PO intake and provide feeding assistance as needed ? ?NUTRITION DIAGNOSIS:  ? ?Increased nutrient needs related to post-op healing, chronic illness (CHF, COPD) as evidenced by estimated needs. ? ?GOAL:  ? ?Patient will meet greater than or equal to 90% of their needs ? ?MONITOR:  ? ?PO intake, Supplement acceptance, Labs, Weight trends, Skin ? ?REASON FOR ASSESSMENT:  ? ?Malnutrition Screening Tool, Consult ?Assessment of nutrition requirement/status ? ?ASSESSMENT:  ? ?87 year old female who presented to the ED on 2/28 after sliding out of her chair. PMH of CHF, CKD stage IIIa, COPD, CAD, CVA, chronic atrial fibrillation, vascular dementia, HTN, hypothyroidism, depression, anxiety. Pt admitted with open R tib-fib fracture. Pt is on home hospice. ? ?03/01 - s/p IM nailing R tibia fx, I&D R open tibia fx ? ?Spoke with pt's daughter at bedside. Pt's daughter reports pt is very "out of it" and confused and lethargic today, more so than she has ever been before. Pt's daughter reports pt ate a little bit of breakfast this morning and drank a [employer]. RN brought in an Ensure at time of visit and daughter was going to offer some to pt once she woke up more. ? ?Pt's daughter reports that pt eats okay at home and has a fair appetite. Pt typically eats half to a whole bacon biscuit for breakfast. For lunch, pt has something small like a [employer]. Pt eats a balanced dinner but portions are small. ? ?Pt's daughter reports slow and steady weight loss over the last year. Reviewed weight history in chart. Pt with a 2.3 kg weight loss since 04/18/20. This is a 3.6%  weight loss in 1 year which is not significant for timeframe. ? ?Pt's daughter willing to try Ensure and Mighty Shakes to promote PO intake. Deferred NFPE due to pt being in pain and daughter's request to let pt rest. Will attempt at follow-up. Suspect malnutrition but unable to confirm. ? ?Discussed diet liberalization with MD who agreed. Verbal with readback order placed for a Regular diet. ? ?Medications reviewed and include: colace, Ensure Enlive BID, lasix, klor-con 40 mEq BID, warfarin, IV abx ? ?Labs reviewed: potassium 3.2, creatinine 1.02, hemoglobin 9.2 ? ?NUTRITION - FOCUSED PHYSICAL EXAM: ? ?Deferred at this time due to patient in pain. ? ?Diet Order:   ?Diet Order   ? ?       ?  Diet regular Room service appropriate? Yes; Fluid consistency: Thin  Diet effective now       ?  ? ?  ?  ? ?  ? ? ?EDUCATION NEEDS:  ? ?No education needs have been identified at this time ? ?Skin:  Skin Assessment: ?Skin Integrity Issues: ?Incisions: RLE x 2 ? ?Last BM:  04/10/21 ? ?Height:  ? ?Ht Readings from Last 1 Encounters:  ?04/10/21 5\' 5"  (1.651 m)  ? ? ?Weight:  ? ?Wt Readings from Last 1 Encounters:  ?04/10/21 61.7 kg  ? ? ?BMI:  Body mass index is 22.63 kg/m?. ? ?Estimated Nutritional Needs:  ? ?Kcal:  1500-1700 ? ?Protein:  75-85 grams ? ?Fluid:  1.5-1.7 L ? ? ? ?Anda Kraft  Vertell Limber, MS, RD, LDN ?Inpatient Clinical Dietitian ?Please see AMiON for contact information. ? ?

## 2021-04-12 ENCOUNTER — Inpatient Hospital Stay (HOSPITAL_COMMUNITY)

## 2021-04-12 DIAGNOSIS — S82301B Unspecified fracture of lower end of right tibia, initial encounter for open fracture type I or II: Secondary | ICD-10-CM | POA: Diagnosis not present

## 2021-04-12 DIAGNOSIS — I5042 Chronic combined systolic (congestive) and diastolic (congestive) heart failure: Secondary | ICD-10-CM | POA: Diagnosis not present

## 2021-04-12 DIAGNOSIS — I25709 Atherosclerosis of coronary artery bypass graft(s), unspecified, with unspecified angina pectoris: Secondary | ICD-10-CM | POA: Diagnosis not present

## 2021-04-12 DIAGNOSIS — S82201B Unspecified fracture of shaft of right tibia, initial encounter for open fracture type I or II: Secondary | ICD-10-CM | POA: Diagnosis not present

## 2021-04-12 LAB — CBC
HCT: 28.5 % — ABNORMAL LOW (ref 36.0–46.0)
Hemoglobin: 8.9 g/dL — ABNORMAL LOW (ref 12.0–15.0)
MCH: 28.9 pg (ref 26.0–34.0)
MCHC: 31.2 g/dL (ref 30.0–36.0)
MCV: 92.5 fL (ref 80.0–100.0)
Platelets: 256 10*3/uL (ref 150–400)
RBC: 3.08 MIL/uL — ABNORMAL LOW (ref 3.87–5.11)
RDW: 15.4 % (ref 11.5–15.5)
WBC: 10.6 10*3/uL — ABNORMAL HIGH (ref 4.0–10.5)
nRBC: 0 % (ref 0.0–0.2)

## 2021-04-12 LAB — BASIC METABOLIC PANEL
Anion gap: 11 (ref 5–15)
BUN: 11 mg/dL (ref 8–23)
CO2: 24 mmol/L (ref 22–32)
Calcium: 8.1 mg/dL — ABNORMAL LOW (ref 8.9–10.3)
Chloride: 103 mmol/L (ref 98–111)
Creatinine, Ser: 0.96 mg/dL (ref 0.44–1.00)
GFR, Estimated: 58 mL/min — ABNORMAL LOW (ref >60)
Glucose, Bld: 120 mg/dL — ABNORMAL HIGH (ref 70–99)
Potassium: 4.1 mmol/L (ref 3.5–5.1)
Sodium: 138 mmol/L (ref 135–145)

## 2021-04-12 LAB — VITAMIN D 25 HYDROXY (VIT D DEFICIENCY, FRACTURES)

## 2021-04-12 LAB — PROTIME-INR
INR: 2.1 — ABNORMAL HIGH (ref 0.8–1.2)
Prothrombin Time: 23.4 seconds — ABNORMAL HIGH (ref 11.4–15.2)

## 2021-04-12 NOTE — TOC Initial Note (Signed)
Transition of Care (TOC) - Initial/Assessment Note  ? ? ?Patient Details  ?Name: Amber [name] ?MRN: [id number] ?Date of Birth: Sep 24, 1934 ? ?Transition of Care [facility]) CM/SW Contact:    ?[name] Chars, LCSW ?Phone Number: ?04/12/2021, 1:13 PM ? ?Clinical Narrative:  CSW met with daughters [name] and Lambert Keto, confirmed plan to return home with [facility] hospice once stable.  Hospice is ordering hoyer lift, but otherwise they have all the DME they need.  ? ?TOC will continue to follow for DC needs.                ? ? ?Expected Discharge Plan: [place] ?Barriers to Discharge: Continued Medical Work up ? ? ?Patient Goals and CMS Choice ?  ?  ?  ? ?Expected Discharge Plan and Services ?Expected Discharge Plan: [place] ?In-house Referral: Clinical Social Work ?  ?Post Acute Care Choice: Hospice ?Living arrangements for the past 2 months: [place] ?                ?  ?  ?  ?  ?  ?  ?  ?  ?  ?  ? ?Prior Living Arrangements/Services ?Living arrangements for the past 2 months: [place] ?Lives with:: Spouse ?Patient language and need for interpreter reviewed:: No ?       ?Need for Family Participation in Patient Care: Yes (Comment) ?Care giver support system in place?: Yes (comment) ?Current home services: Hospice ([facility]) ?Criminal Activity/Legal Involvement Pertinent to Current Situation/Hospitalization: No - Comment as needed ? ?Activities of Daily Living ?Home Assistive Devices/Equipment: Other (Comment) (patient confused unable to determine) ?ADL Screening (condition at time of admission) ?Patient's cognitive ability adequate to safely complete daily activities?: No ?Is the patient deaf or have difficulty hearing?: No ?Does the patient have difficulty seeing, even when wearing glasses/contacts?: Yes ?Does the patient have difficulty concentrating, remembering, or making decisions?: Yes ?Patient able to express need for assistance with ADLs?: No ?Does the patient have  difficulty dressing or bathing?: Yes ?Independently performs ADLs?: No ?Does the patient have difficulty walking or climbing stairs?: Yes ?Weakness of Legs: Both ?Weakness of Arms/Hands: Both ? ?Permission Sought/Granted ?  ?  ?   ?   ?   ?   ? ?Emotional Assessment ?Appearance:: Appears stated age ?Attitude/Demeanor/Rapport: Unable to Assess ?Affect (typically observed): Unable to Assess ?Orientation: : Oriented to Self ?Alcohol / Substance Use: Not Applicable ?Psych Involvement: No (comment) ? ?Admission diagnosis:  Fracture [T14.8XXA] ?Open tibial fracture [S82.209B] ?Type I or II open fracture of shaft of right tibia, unspecified fracture morphology, initial encounter [S82.201B] ?Patient Active Problem List  ? Diagnosis Date Noted  ? Acute postoperative anemia due to expected blood loss 04/10/2021  ? Chronic kidney disease, stage III (moderate) ([place]) 04/10/2021  ? Vascular dementia ([place]) 04/10/2021  ? Essential hypertension 04/10/2021  ? Open tibial fracture 04/09/2021  ? Ankle fracture 11/01/2020  ? Rhinovirus infection 08/04/2019  ? Aspiration pneumonitis ([place]) 08/03/2019  ? Acute respiratory failure with hypoxia ([place]) 08/03/2019  ? Hypoxia 07/23/2019  ? Sprain of ankle 05/18/2019  ? Foot callus 05/18/2019  ? Fall (on) (from) other stairs and steps, initial encounter 04/28/2019  ? Left rib fracture 04/28/2019  ? Compression fracture of body of thoracic vertebra ([place]) 04/28/2019  ? Acute pulmonary edema (HCC)   ? Palliative care encounter   ? Acute on chronic diastolic CHF (congestive heart failure) ([place]) 03/04/2017  ? Asthma-COPD overlap syndrome ([place]) 02/06/2017  ?  Acute on chronic respiratory failure with hypoxia ([place]) 02/06/2017  ? Dementia ([place]) 02/06/2017  ? Encounter for therapeutic drug monitoring 10/20/2016  ? Cerebrovascular disease 05/21/2016  ? Hemiparesis and alteration of sensations as late effects of stroke ([place]) 01/17/2015  ? Memory change 01/23/2014  ? Steroid-induced psychosis 02/14/2013  ?  Long term current use of anticoagulant therapy   ? Streptococcal pneumonia ([place]) 02/12/2013  ? Vertebral artery stenosis 12/18/2011  ? COPD (chronic obstructive pulmonary disease) ([place]) 06/04/2010  ? Fibromyalgia 05/10/2010  ? Depression 05/10/2010  ? Hypothyroidism 05/10/2010  ? Osteoporosis 05/10/2010  ? Normocytic anemia 05/10/2010  ? Coronary artery disease involving coronary bypass graft of native heart with angina pectoris ([place]) 05/10/2010  ? Chronic combined systolic and diastolic heart failure ([place])   ? Dyslipidemia   ? Atrial fibrillation ([place]) 05/06/2010  ? ?PCP:  Antony Contras, MD ?Pharmacy:   ?[id number] [id number]?Phone: [phone]: [phone]? ? ? ? ?Social Determinants of Health (SDOH) Interventions ?  ? ?Readmission Risk Interventions ?No flowsheet data found. ? ? ?

## 2021-04-12 NOTE — Progress Notes (Signed)
ANTICOAGULATION CONSULT NOTE - Follow Up Consult ? ?Pharmacy Consult for Warfarin ?Indication: atrial fibrillation ? ?Patient Measurements: ?Height: 5\' 5"  (165.1 cm) ?Weight: 61.7 kg (136 lb) ?IBW/kg (Calculated) : 57 ? ?Vital Signs: ?BP: 125/56 (03/03 0414) ?Pulse Rate: 86 (03/03 0414) ? ?Labs: ?Recent Labs  ?  04/10/21 ?6144 04/11/21 ?0300 04/12/21 ?0215  ?HGB 10.3* 9.2* 8.9*  ?HCT 32.2* 28.0* 28.5*  ?PLT 277 253 256  ?LABPROT 24.9* 25.2* 23.4*  ?INR 2.3* 2.3* 2.1*  ?CREATININE 0.91 1.02* 0.96  ? ? ? ?Estimated Creatinine Clearance: 39.3 mL/min (by C-G formula based on SCr of 0.96 mg/dL). ? ?Assessment: ?87 yr old female on Warfarin prior to admission for atrial fibrillation and hx CVA to continue warfarin s/p I&D and IM nailing for right tibia fracture. Open fracture after fall PTA. ?  ?INR 2.0  on admit 2/28 and 2.3 on 3/1.  Vitamin K 2 mg PO x 1 given on 3/1 am.  Warfarin resumed post-op 3/1.  Today INR remains therapeutic at 2.1.  ?  ? PTA Warfarin regimen: 5 mg daily ?  ?Goal of Therapy:  ?INR 2-3 ?Monitor platelets by anticoagulation protocol: Yes ?  ?Plan:  ?Continue Warfarin 5 mg daily at 4pm. ?Daily PT/INR ?Monitor for effects from Vitamin K dose on 3/1. ? ?Arman Bogus, RPh ?04/12/2021,2:40 PM ? ? ?

## 2021-04-12 NOTE — Progress Notes (Addendum)
[facility] - [employer] [facility])  hospitalized hospice [name] note. ?  ?This is  a current Amber [name] with a diagnosis of of CHF and CKD. She was transported to ED after a fall for fractured leg. Family did notify hospice. She was admitted on 3/1 for a diagnosis of right tibia fracture. Per Dr. Cherie Ouch this is a related hospital admission.  ? ?Visited at bedside with both daughters present. She was awake and they were assisting her to eat but [name] declined further food and appeared very drowsy. She did not appear in pain or distress although she does experience pain when moved in the bed.  ? ?This [name] is appropriate for hospital admission due to need for surgery, symptom management.  ? ?VS: 125/56, 86, 18, 98% RA ?I/O: 360/1300 ? ?Abnormal labs: ?04/12/21 02:15 ?Glucose: 120 (H) ?Calcium: 8.1 (L) ?04/12/21 02:15 ?GFR, Estimated: 58 (L) ?WBC: 10.6 (H) ?RBC: 3.08 (L) ?Hemoglobin: 8.9 (L) ?HCT: 28.5 (L) ?Prothrombin Time: 23.4 (H) ?INR: 2.1 (H) ? ?Diagnostics: ?CXR ?IMPRESSION: ?Small left pleural effusion suspected and stable since 04/09/2021. ?No other acute cardiopulmonary abnormality. ? ?IV/PRN medications: Scheduled PO tylenol Q6hrs, no PRNs today.   ? ?Problem list: ?Acute traumatic Open tibial fracture: ?Orthopedic surgery was consulted she is status post intramedullary nailing of the tibia on the right with irrigation and debridement on 04/10/2021 ?Consult physical therapy, she will need to go home with hospice. ?  ?Acute metabolic encephalopathy: ?Of unclear etiology get a CT scan of the head . ?He appears euvolemic now, has mild leukocytosis, has remained afebrile Tmax of 98.8. ?Check a chest x-ray.  Question if she aspirated ?Unlikely to be medication induced for the last 24 hours the only changes gotten his Tylenol. ?Calcium is low but normal corrected for her albumin. ?Hold Lasix and all other oral medications. ? ?Acute blood loss anemia/chronic normocytic anemia: ?Hemoglobin  is 10.3, will continue to monitor regularly. ?Hemoglobin this morning is 8.9 we will check a CBC tomorrow morning.  Low threshold for transfusion.  ? ?Chronic kidney disease stage IIIa: ?Her baseline creatinine is around 0.9-1.2, her creatinine appears to be at baseline we will continue to monitor closely. ?  ?Advanced combined chronic diastolic heart failure: ?Hold her Lasix, she appears euvolemic today. ? ?COPD without exacerbation: ?Continue current home inhalers. ?She has been weaned to room air. ? ?CVA/CAD with residual left weakness/hypertension/chronic atrial fibrillation on Coumadin: ?Orthopedic surgery to dictate when to restart Coumadin. ? ?Advanced vascular dementia: ?Family at bedside she is at high risk of delirium sundowning and aspiration pneumonia. ?  ?Hypothyroidism: ?Continue current home regimen. ?  ?Small pleural effusion on the left: ?Likely due to hypoalbuminemia in the setting of fluid overload. ?Has been weaned to room air. ? ?Depression/anxiety: ?Resume home regimen postoperatively. ?  ?Moderate protein caloric malnutrition: ?Noted ?  ?Discharge planning: return home with hospice when appropriate for discharge. ?Family Contact: Spoke with daughters at bedside ?IDG: updated ?Goals of Care: Clear. DNR.  ?  ?Should [name] need ambulance transport at discharge please use [facility] as [facility] contracts this service with them for our active hospice patients. ?  ?A Please do not hesitate to call with questions.   ?Thank you,   ?Farrel Gordon, RN, CCM      ?[facility] Hospital Liaison   ?[phone] ?  ?  ?

## 2021-04-12 NOTE — Progress Notes (Signed)
Orthopaedic Trauma Progress Note ? ?SUBJECTIVE: Doing fair this morning, remains extremely drowsy. Once patient opened her eyes, she was more alert than yesterday. Able to state her name and DOB. Drowsiness initally thought to be medication induced post-operatively but now she has gone over 24 hours with Tylenol only and still remains somnolent. CT head ordered by primary team. Daughter Amber [name]) at bedside. She is concerned about the rotation of the patient's operative leg.  ? ?OBJECTIVE:  ?Vitals:  ? 04/11/21 2008 04/12/21 0414  ?BP: (!) 127/58 (!) 125/56  ?Pulse: 77 86  ?Resp: 19 18  ?Temp: 98.8 ?F (37.1 ?C)   ?SpO2: 96% 98%  ? ? ?General: Resting in bed comfortably, no acute distress ?Respiratory: No increased work of breathing.  ?RLE: Dressings removed, knee incision CDI. New Mepilex dressing applied. The traumatic laceration to anterior lower leg is stable. This area of skin is extremely fragile at baseline due to previous treatment with Florouracil for skin cancer. Tenderness noted with palpation throughout lower leg, as expected.  Tolerates minimal knee motion secondary to pain. Ankle motion limited at baseline. Sensation grossly intact to light touch throughout extremity but overall it is difficult to obtain a reliable motor or sensory exam due to patient's drowsiness.  Toes warm and well-perfused. ? ?IMAGING: Stable post op imaging.  ? ?LABS:  ?Results for orders placed or performed during the hospital encounter of 04/09/21 (from the past 24 hour(s))  ?Basic metabolic panel     Status: Abnormal  ? Collection Time: 04/12/21  2:15 AM  ?Result Value Ref Range  ? Sodium 138 135 - 145 mmol/L  ? Potassium 4.1 3.5 - 5.1 mmol/L  ? Chloride 103 98 - 111 mmol/L  ? CO2 24 22 - 32 mmol/L  ? Glucose, Bld 120 (H) 70 - 99 mg/dL  ? BUN 11 8 - 23 mg/dL  ? Creatinine, Ser 0.96 0.44 - 1.00 mg/dL  ? Calcium 8.1 (L) 8.9 - 10.3 mg/dL  ? GFR, Estimated 58 (L) >60 mL/min  ? Anion gap 11 5 - 15  ?CBC     Status: Abnormal  ?  Collection Time: 04/12/21  2:15 AM  ?Result Value Ref Range  ? WBC 10.6 (H) 4.0 - 10.5 K/uL  ? RBC 3.08 (L) 3.87 - 5.11 MIL/uL  ? Hemoglobin 8.9 (L) 12.0 - 15.0 g/dL  ? HCT 28.5 (L) 36.0 - 46.0 %  ? MCV 92.5 80.0 - 100.0 fL  ? MCH 28.9 26.0 - 34.0 pg  ? MCHC 31.2 30.0 - 36.0 g/dL  ? RDW 15.4 11.5 - 15.5 %  ? Platelets 256 150 - 400 K/uL  ? nRBC 0.0 0.0 - 0.2 %  ?Protime-INR     Status: Abnormal  ? Collection Time: 04/12/21  2:15 AM  ?Result Value Ref Range  ? Prothrombin Time 23.4 (H) 11.4 - 15.2 seconds  ? INR 2.1 (H) 0.8 - 1.2  ? ?*Note: Due to a large number of results and/or encounters for the requested time period, some results have not been displayed. A complete set of results can be found in Results Review.  ? ? ?ASSESSMENT: Amber [name] is a 87 y.o. female, 2 Days Post-Op s/p ?INTRAMEDULLARY NAIL RIGHT TIBIA ?IRRIGATION AND DEBRIDEMENT RIGHT LEG ? ?CV/Blood loss: Acute blood loss anemia, Hgb 8.9 this AM. ? ?PLAN: ?Weightbearing: WBAT RLE ?ROM: ok for gentle knee and ankle motion as tolerated  ?Incisional and dressing care:  Change PRN ?Showering: Ok to shower, keep RLE dressing dry ?Orthopedic device(s):  None  ?Pain management: Limit use of narcotics ?1. Tylenol 650 mg q 6 hours scheduled ?2. Tramadol 50 mg q 6 hours PRN ?3. Norco 5-325 mg q 4 hours PRN ?4. Fentanyl 12.5-50 mcg q 2 hours PRN ?VTE prophylaxis: Coumadin, SCDs ?ID:  Ancef 2gm post op per open fracture protocol completed ?Foley/Lines:  No foley, KVO IVFs ?Impediments to Fracture Healing: Open fracture. Vit D level pending, will start supplementation as indicated ?Dispo: Therapies as able. Daughters would like to take patient home at discharge.  In terms of pain management, we will stick with scheduled Tylenol until patient is more alert. ? ?Follow - up plan: 2 weeks after discharge for repeat x-rays and wound check ? ? ?Contact information:  Katha Hamming MD, Rushie Nyhan PA-C. After hours and holidays please check [email or web address] for group call  information for [facility] ? ? ?Gwinda Passe, PA-C ?[phone] (office) ?[email or web address] ? ? ? ?

## 2021-04-12 NOTE — Progress Notes (Signed)
[facility] PROGRESS NOTE    Progress Note  AASHA DINA  HWT:[id number] DOB: 1934-07-14 DOA: 04/09/2021 PCP: Antony Contras, MD     Brief Narrative:   Amber [name] is an 87 y.o. female past medical history significant for advanced combined systolic and diastolic heart failure, chronic kidney disease stage III with a CVA with left residual weakness, chronic atrial fibrillation on Coumadin who is in the hospice for advanced heart failure comes in with open right tib-fib fracture after what appears to be a traumatic fall from lift chair unwitnessed orthopedic surgery was consulted recommended hospital admission, and plan ORIF on the day of admission she had a previous fracture of the right lower extremity with hardware placed on 11/29/2020 removed recently in March 22, 2021    Assessment/Plan:   Acute traumatic Open tibial fracture: Orthopedic surgery was consulted she is status post intramedullary nailing of the tibia on the right with irrigation and debridement on 04/10/2021 Consult physical therapy, she will need to go home with hospice.  Acute metabolic encephalopathy: Of unclear etiology get a CT scan of the head . He appears euvolemic now, has mild leukocytosis, has remained afebrile Tmax of 98.8. Check a chest x-ray.  Question if she aspirated Unlikely to be medication induced for the last 24 hours the only changes gotten his Tylenol. Calcium is low but normal corrected for her albumin. Hold Lasix and all other oral medications.  Acute blood loss anemia/chronic normocytic anemia: Hemoglobin is 10.3, will continue to monitor regularly. Hemoglobin this morning is 8.9 we will check a CBC tomorrow morning.  Low threshold for transfusion.   Chronic kidney disease stage IIIa: Her baseline creatinine is around 0.9-1.2, her creatinine appears to be at baseline we will continue to monitor closely.  Advanced combined chronic diastolic heart failure: Hold her Lasix, she  appears euvolemic today.  COPD without exacerbation: Continue current home inhalers. She has been weaned to room air.  CVA/CAD with residual left weakness/hypertension/chronic atrial fibrillation on Coumadin: Orthopedic surgery to dictate when to restart Coumadin.  Advanced vascular dementia: Family at bedside she is at high risk of delirium sundowning and aspiration pneumonia.  Hypothyroidism: Continue current home regimen.  Small pleural effusion on the left: Likely due to hypoalbuminemia in the setting of fluid overload. Has been weaned to room air.  Depression/anxiety: Resume home regimen postoperatively.  Moderate protein caloric malnutrition: Noted  Hypokalemia: Repleted now resolved.  Ethics: The patient will need to go home with hospice.     DVT prophylaxis: coumadin Family Communication:daughter Status is: Inpatient Remains inpatient appropriate because: Acute tibial fracture      Code Status:     Code Status Orders  (From admission, onward)           Start     Ordered   04/09/21 1647  Do not attempt resuscitation (DNR)  Continuous       Question Answer Comment  In the event of cardiac or respiratory ARREST Do not call a code blue   In the event of cardiac or respiratory ARREST Do not perform Intubation, CPR, defibrillation or ACLS   In the event of cardiac or respiratory ARREST Use medication by any route, position, wound care, and other measures to relive pain and suffering. May use oxygen, suction and manual treatment of airway obstruction as needed for comfort.      04/09/21 1649           Code Status History     Date Active Date  Inactive Code Status Order ID Comments User Context   11/01/2020 1429 11/08/2020 2346 DNR [id number]  Guilford Shi, MD ED   07/24/2019 1714 08/04/2019 2004 DNR [id number]  Patrecia Pour, MD Inpatient   07/23/2019 2356 07/24/2019 1713 Full Code [id number]  Mansy, Arvella Merles, MD ED   04/28/2019 1656 04/30/2019 2027  Full Code [id number]  Georganna Skeans, MD ED   03/04/2017 2337 03/07/2017 2022 DNR [id number]  Rise Patience, MD ED   02/06/2017 1003 02/17/2017 2133 DNR [id number]  Debbe Odea, MD ED   01/02/2017 0042 01/03/2017 1901 Full Code [id number]  Oswald Hillock, MD Inpatient   01/02/2017 0024 01/02/2017 0042 DNR [id number]  Oswald Hillock, MD Inpatient   07/27/2014 1610 07/30/2014 1241 Full Code [id number]  Luanne Bras, MD Inpatient   06/30/2014 1421 07/01/2014 0316 Full Code [id number]  Luanne Bras, MD HOV   02/13/2013 0111 02/19/2013 1418 Full Code [id number]  Toy Baker, MD Inpatient         IV Access:   Peripheral IV   Procedures and diagnostic studies:   DG Tibia/Fibula Right  Result Date: 04/10/2021 CLINICAL DATA:  Right tibial nail placement.  Operative fluoroscopy. EXAM: RIGHT TIBIA AND FIBULA - 2 VIEW COMPARISON:  04/09/2021 FINDINGS: Images were performed intraoperatively without the presence of a radiologist. Patient appears to be undergoing intramedullary nail fixation of the previously seen comminuted distal tibial fracture. Total fluoroscopy time: 1 minute 9 seconds Total dose: 1.73 mGy Total fluoro images: 5 Please see intraoperative findings for further detail. IMPRESSION: Operative fluoroscopy for intramedullary nail fixation of the tibia. Electronically Signed   By: Yvonne Kendall M.D.   On: 04/10/2021 12:44   DG Tibia/Fibula Right Port  Result Date: 04/10/2021 CLINICAL DATA:  Tibia fibular fracture, postoperative exam EXAM: PORTABLE RIGHT TIBIA AND FIBULA - 2 VIEW COMPARISON:  04/10/2021 FINDINGS: right tibia intramedullary rod insertion reduces the distal tibia shaft displaced spiral type fracture. Proximal and distal fixation screws. Improved alignment. Fracture lines remain visible. Inferior fibular shaft fracture also noted. Bones are osteopenic. Degenerative changes of the ankle. IMPRESSION: Status post ORIF of the right tibia distal shaft spiral type fracture  with improved alignment, near anatomic. Electronically Signed   By: Jerilynn Mages.  Shick M.D.   On: 04/10/2021 13:44   DG C-Arm 1-60 Min-No Report  Result Date: 04/10/2021 Fluoroscopy was utilized by the requesting physician.  No radiographic interpretation.     Medical Consultants:   None.   Subjective:    Shaneisha Burkel Raimondo sleepy this morning  Objective:    Vitals:   04/11/21 0417 04/11/21 0805 04/11/21 2008 04/12/21 0414  BP: (!) 115/55 131/86 (!) 127/58 (!) 125/56  Pulse: 86  77 86  Resp: 20  19 18   Temp: 98.2 F (36.8 C) 98.3 F (36.8 C) 98.8 F (37.1 C)   TempSrc: Axillary  Oral   SpO2: 90%  96% 98%  Weight:      Height:       SpO2: 98 % O2 Flow Rate (L/min): 1 L/min   Intake/Output Summary (Last 24 hours) at 04/12/2021 0742 Last data filed at 04/12/2021 0550 Gross per 24 hour  Intake 360 ml  Output 1300 ml  Net -940 ml    Filed Weights   04/10/21 1003  Weight: 61.7 kg    Exam: General exam: In no acute distress, cachectic Respiratory system: Good air movement clear to auscultation Cardiovascular system: S1 & S2 heard, RRR.  No apparent JVD Gastrointestinal system: Abdomen is nondistended, soft  and nontender.  Central nervous system: Responsive to nasal still stimuli, she is very slow to respond when you call her name or touch her. Extremities: No pedal edema. Skin: No rashes, lesions or ulcers  Data Reviewed:    Labs: Basic Metabolic Panel: Recent Labs  Lab 04/09/21 1415 04/10/21 0341 04/11/21 0300 04/12/21 0215  NA 144 142 139 138  K 4.2 3.7 3.2* 4.1  CL 107 106 103 103  CO2 27 24 28 24   GLUCOSE 132* 117* 140* 120*  BUN 14 12 12 11   CREATININE 1.12* 0.91 1.02* 0.96  CALCIUM 9.0 8.5* 8.1* 8.1*    GFR Estimated Creatinine Clearance: 39.3 mL/min (by C-G formula based on SCr of 0.96 mg/dL). Liver Function Tests: Recent Labs  Lab 04/10/21 0341  AST 23  ALT 14  ALKPHOS 47  BILITOT 0.7  PROT 5.4*  ALBUMIN 2.9*    No results for input(s):  LIPASE, AMYLASE in the last 168 hours. No results for input(s): AMMONIA in the last 168 hours. Coagulation profile Recent Labs  Lab 04/09/21 1415 04/10/21 0341 04/11/21 0300 04/12/21 0215  INR 2.0* 2.3* 2.3* 2.1*    COVID-19 Labs  No results for input(s): DDIMER, FERRITIN, LDH, CRP in the last 72 hours.  Lab Results  Component Value Date   SARSCOV2NAA NEGATIVE 04/09/2021   [place] NEGATIVE 11/01/2020   [place] NEGATIVE 07/23/2019   [place] NEGATIVE 04/28/2019    CBC: Recent Labs  Lab 04/09/21 1415 04/10/21 0341 04/11/21 0300 04/12/21 0215  WBC 5.6 7.8 9.6 10.6*  HGB 9.2* 10.3* 9.2* 8.9*  HCT 28.8* 32.2* 28.0* 28.5*  MCV 93.8 92.5 91.2 92.5  PLT 262 277 253 256    Cardiac Enzymes: No results for input(s): CKTOTAL, CKMB, CKMBINDEX, TROPONINI in the last 168 hours. BNP (last 3 results) Recent Labs    09/06/20 1040  PROBNP 5,862*    CBG: No results for input(s): GLUCAP in the last 168 hours. D-Dimer: No results for input(s): DDIMER in the last 72 hours. Hgb A1c: No results for input(s): HGBA1C in the last 72 hours. Lipid Profile: No results for input(s): CHOL, HDL, LDLCALC, TRIG, CHOLHDL, LDLDIRECT in the last 72 hours. Thyroid function studies: No results for input(s): TSH, T4TOTAL, T3FREE, THYROIDAB in the last 72 hours.  Invalid input(s): FREET3 Anemia work up: No results for input(s): VITAMINB12, FOLATE, FERRITIN, TIBC, IRON, RETICCTPCT in the last 72 hours. Sepsis Labs: Recent Labs  Lab 04/09/21 1415 04/10/21 0341 04/11/21 0300 04/12/21 0215  WBC 5.6 7.8 9.6 10.6*    Microbiology Recent Results (from the past 240 hour(s))  Resp Panel by RT-PCR (Flu A&B, Covid) Nasopharyngeal Swab     Status: None   Collection Time: 04/09/21  2:00 PM   Specimen: Nasopharyngeal Swab; Nasopharyngeal(NP) swabs in vial transport medium  Result Value Ref Range Status   SARS Coronavirus 2 by RT PCR NEGATIVE NEGATIVE Final    Comment:  (NOTE) SARS-CoV-2 target nucleic acids are NOT DETECTED.  The SARS-CoV-2 RNA is generally detectable in upper respiratory specimens during the acute phase of infection. The lowest concentration of SARS-CoV-2 viral copies this assay can detect is 138 copies/mL. A negative result does not preclude SARS-Cov-2 infection and should not be used as the sole basis for treatment or other patient management decisions. A negative result may occur with  improper specimen collection/handling, submission of specimen other than nasopharyngeal swab, presence of viral mutation(s) within the areas targeted by this assay, and inadequate number of viral copies(<138 copies/mL). A negative result  must be combined with clinical observations, patient history, and epidemiological information. The expected result is Negative.  Fact Sheet for Patients:  EntrepreneurPulse.com.au  Fact Sheet for Healthcare Providers:  IncredibleEmployment.be  This test is no t yet approved or cleared by the [place] FDA and  has been authorized for detection and/or diagnosis of SARS-CoV-2 by FDA under an Emergency Use Authorization (EUA). This EUA will remain  in effect (meaning this test can be used) for the duration of the COVID-19 declaration under Section 564(b)(1) of the Act, 21 U.S.C.section 360bbb-3(b)(1), unless the authorization is terminated  or revoked sooner.       Influenza A by PCR NEGATIVE NEGATIVE Final   Influenza B by PCR NEGATIVE NEGATIVE Final    Comment: (NOTE) The Xpert Xpress SARS-CoV-2/FLU/RSV plus assay is intended as an aid in the diagnosis of influenza from Nasopharyngeal swab specimens and should not be used as a sole basis for treatment. Nasal washings and aspirates are unacceptable for Xpert Xpress SARS-CoV-2/FLU/RSV testing.  Fact Sheet for Patients: EntrepreneurPulse.com.au  Fact Sheet for Healthcare  Providers: IncredibleEmployment.be  This test is not yet approved or cleared by the [place] FDA and has been authorized for detection and/or diagnosis of SARS-CoV-2 by FDA under an Emergency Use Authorization (EUA). This EUA will remain in effect (meaning this test can be used) for the duration of the COVID-19 declaration under Section 564(b)(1) of the Act, 21 U.S.C. section 360bbb-3(b)(1), unless the authorization is terminated or revoked.  Performed at [facility], [place] [address] [place], [place] [address]      Medications:    acetaminophen  650 mg Oral Q6H   allopurinol  300 mg Oral QHS   citalopram  20 mg Oral BID   docusate sodium  100 mg Oral BID   donepezil  5 mg Oral Daily   feeding supplement  237 mL Oral BID BM   furosemide  120 mg Oral Daily   levothyroxine  25 mcg Oral Q0600   memantine  10 mg Oral BID   metoprolol succinate  25 mg Oral Daily   warfarin  5 mg Oral q1600   Warfarin - Pharmacist Dosing Inpatient   Does not apply q1600   Continuous Infusions:      LOS: 3 days   Charlynne Cousins  [facility]  04/12/2021, 7:42 AM

## 2021-04-13 DIAGNOSIS — S82301B Unspecified fracture of lower end of right tibia, initial encounter for open fracture type I or II: Secondary | ICD-10-CM | POA: Diagnosis not present

## 2021-04-13 DIAGNOSIS — J439 Emphysema, unspecified: Secondary | ICD-10-CM

## 2021-04-13 DIAGNOSIS — I5042 Chronic combined systolic (congestive) and diastolic (congestive) heart failure: Secondary | ICD-10-CM | POA: Diagnosis not present

## 2021-04-13 DIAGNOSIS — D62 Acute posthemorrhagic anemia: Secondary | ICD-10-CM | POA: Diagnosis not present

## 2021-04-13 LAB — BASIC METABOLIC PANEL
Anion gap: 10 (ref 5–15)
BUN: 17 mg/dL (ref 8–23)
CO2: 26 mmol/L (ref 22–32)
Calcium: 8.4 mg/dL — ABNORMAL LOW (ref 8.9–10.3)
Chloride: 102 mmol/L (ref 98–111)
Creatinine, Ser: 0.92 mg/dL (ref 0.44–1.00)
GFR, Estimated: >60 mL/min (ref >60)
Glucose, Bld: 193 mg/dL — ABNORMAL HIGH (ref 70–99)
Potassium: 3.8 mmol/L (ref 3.5–5.1)
Sodium: 138 mmol/L (ref 135–145)

## 2021-04-13 LAB — PROTIME-INR
INR: 1.8 — ABNORMAL HIGH (ref 0.8–1.2)
Prothrombin Time: 21.1 seconds — ABNORMAL HIGH (ref 11.4–15.2)

## 2021-04-13 MED ORDER — DONEPEZIL HCL 5 MG PO TABS
5.0000 mg | ORAL_TABLET | Freq: Every day | ORAL | Status: DC
  Administered 2021-04-13 – 2021-04-15 (×3): 5 mg via ORAL
  Filled 2021-04-13 (×3): qty 1

## 2021-04-13 MED ORDER — MEMANTINE HCL 10 MG PO TABS
10.0000 mg | ORAL_TABLET | Freq: Two times a day (BID) | ORAL | Status: DC
  Administered 2021-04-13 – 2021-04-15 (×5): 10 mg via ORAL
  Filled 2021-04-13 (×5): qty 1

## 2021-04-13 MED ORDER — CITALOPRAM HYDROBROMIDE 20 MG PO TABS
20.0000 mg | ORAL_TABLET | Freq: Every day | ORAL | Status: DC
Start: 2021-04-13 — End: 2021-04-15
  Administered 2021-04-13 – 2021-04-15 (×3): 20 mg via ORAL
  Filled 2021-04-13 (×3): qty 1

## 2021-04-13 MED ORDER — CLOPIDOGREL BISULFATE 75 MG PO TABS
75.0000 mg | ORAL_TABLET | Freq: Every day | ORAL | Status: DC
  Administered 2021-04-13 – 2021-04-15 (×3): 75 mg via ORAL
  Filled 2021-04-13 (×3): qty 1

## 2021-04-13 MED ORDER — FENTANYL CITRATE PF 50 MCG/ML IJ SOSY: 12.5000 ug | PREFILLED_SYRINGE | INTRAMUSCULAR | Status: DC | PRN

## 2021-04-13 MED ORDER — WARFARIN SODIUM 7.5 MG PO TABS
7.5000 mg | ORAL_TABLET | Freq: Once | ORAL | Status: AC
  Administered 2021-04-13: 7.5 mg via ORAL
  Filled 2021-04-13 (×2): qty 1

## 2021-04-13 MED ORDER — FUROSEMIDE 40 MG PO TABS
80.0000 mg | ORAL_TABLET | Freq: Every day | ORAL | Status: DC
  Administered 2021-04-13 – 2021-04-15 (×3): 80 mg via ORAL
  Filled 2021-04-13 (×3): qty 2

## 2021-04-13 NOTE — Plan of Care (Signed)
Pt's family updated on pt's status. All questions answered. ?

## 2021-04-13 NOTE — Progress Notes (Signed)
[facility])  Hospitalized hospice patient  ?  ?This is a current [facility] hospice patient with a diagnosis of of CHF and CKD. She was transported to ED after a fall for fractured leg. Family did notify hospice. She was admitted on 3/1 for a diagnosis of right tibia fracture. Per Dr. Cherie Ouch this is a related hospital admission.  ?  ?Visited at bedside. No family at the bedside. Patient is awake and able to communicate. However, she does appear very drowsy and states " I dont feel good." Spoke with patient's MD as patient is no longer receiving abx or IV meds. Per MD, anticipate dc tomorrow but she will reassess.  ?  ?This patient is appropriate for hospital admission due to [facility] management.  ? ?V/S: 98.2, 78, 16, 110/52, 92% on room air ? ?I/O: no intake or output documented today ? ?Abnormal Labs ?Glucose 70 - 99 mg/dL 193 (H)  ?Calcium 8.9 - 10.3 mg/dL 8.4 (L)  ?Prothrombin Time 11.4 - 15.2 seconds 21.1 (H)  ?INR 0.8 - 1.2  1.8 (H)  ? ?No new diagnostics since 3.3 ? ?IV/PRN medications: Scheduled tylenol. No PRNs received today.  ? ?Problem List  ?Acute R traumatic Open tibial fracture ?Orthopedic surgery consulted, status post intramedullary nailing of the tibia with irrigation and debridement on 04/10/2021 ?DVT PPx, pain management as per orthopedics (continue PTA Coumadin, Plavix) ?PT/OT ?  ?Acute metabolic encephalopathy ?Hospital delirium, underlying dementia ?Improving ?Possibly multifactorial, recent surgery, pain meds ?CT head unremarkable ?Chest x-ray unremarkable ?Monitor closely, delirium precautions ? ?Acute blood loss anemia/chronic normocytic anemia ?Likely 2/2 recent surgery ?Daily cbc ? ?Chronic kidney disease stage IIIa ?Stable ?Her baseline creatinine is around 0.9-1.2 ?Daily BMP ?  ?Advanced combined chronic diastolic heart failure ?Appears euvolemic ?Restart home Lasix at a lower dose ?Monitor BMP closely ? ?COPD without exacerbation ?Stable, room air ?Continue current home  inhalers ? ?CVA/CAD with residual left weakness/hypertension/chronic atrial fibrillation on Coumadin: ?Continue Coumadin, orthopedics okay to restart Plavix as well ? ?Advanced vascular dementia ?Family at bedside she is at high risk of delirium sundowning and aspiration pneumonia ?  ?Hypothyroidism ?Continue Synthroid ? ?Depression/anxiety: ?Resume home regimen postoperatively ?  ?Moderate protein caloric malnutrition: ?Noted ?  ?Disposition ?The patient will need to go home with hospice.  ? ?Discharge planning: return home with hospice when medically ready to be discharged ? ? ?Family Contact: Called daughter  ? ?IDG: updated ? ?Goals of Care: DNR. Comfort care.  ?  ?Should patient need ambulance transport at discharge please use [facility] as [facility] contracts this service with them for our active hospice patients. ? ?Please call with any questions or concerns. Thank you ? ?Roselee Nova, LCSW ?[facility] Liaison ?[phone] ?

## 2021-04-13 NOTE — Progress Notes (Signed)
ANTICOAGULATION CONSULT NOTE - Follow Up Consult ? ?Pharmacy Consult for Warfarin ?Indication: atrial fibrillation ? ?Patient Measurements: ?Height: '5\' 5"'$  (165.1 cm) ?Weight: 61.7 kg (136 lb) ?IBW/kg (Calculated) : 57 ? ?Vital Signs: ?Temp: 98.2 ?F (36.8 ?C) (03/04 7356) ?Temp Source: Oral (03/04 0739) ?BP: 115/52 (03/04 1005) ?Pulse Rate: 88 (03/04 1005) ? ?Labs: ?Recent Labs  ?  04/11/21 ?0300 04/12/21 ?0215 04/13/21 ?0222  ?HGB 9.2* 8.9*  --   ?HCT 28.0* 28.5*  --   ?PLT 253 256  --   ?LABPROT 25.2* 23.4* 21.1*  ?INR 2.3* 2.1* 1.8*  ?CREATININE 1.02* 0.96 0.92  ? ? ? ?Estimated Creatinine Clearance: 41 mL/min (by C-G formula based on SCr of 0.92 mg/dL). ? ?Assessment: ?87 yr old female on Warfarin prior to admission for atrial fibrillation and hx CVA to continue warfarin s/p I&D and IM nailing for right tibia fracture. Open fracture after fall PTA. ?  ?INR 2.0  on admit 2/28 and 2.3 on 3/1.  Vitamin K 2 mg PO x 1 given on 3/1 am.  Warfarin resumed post-op 3/1.  ? ?INR down to 1.8 today. Could be from the effect of vitamin k the other day. We will give a slightly higher dose today.  ?  ? PTA Warfarin regimen: 5 mg daily ?  ?Goal of Therapy:  ?INR 2-3 ?Monitor platelets by anticoagulation protocol: Yes ?  ?Plan:  ?Coumadin 7.'5mg'$  PO x1 ?Daily PT/INR ? ?Onnie Boer, PharmD, BCIDP, AAHIVP, CPP ?Infectious Disease Pharmacist ?04/13/2021 11:52 AM ? ? ? ? ?

## 2021-04-13 NOTE — Progress Notes (Signed)
[facility] PROGRESS NOTE    Amber [name]  CZY:[id number] DOB: [birth year] DOA: 04/09/2021 PCP: Antony Contras, MD     HPI Amber [name] is an 87 y.o. female past medical history significant for advanced combined systolic and diastolic heart failure, chronic kidney disease stage III with a CVA with left residual weakness, chronic atrial fibrillation on Coumadin who is in the hospice for advanced heart failure, comes in with R open right tib-fib fracture after what appears to be a traumatic fall from lift chair unwitnessed. Orthopedic surgery was consulted, had surgery on 04/10/21. Of note, pt had a previous fracture of the right ankle with hardware placed on 11/29/2020 removed recently due to infected plates in March 22, 2021.     Assessment/plan  Acute R traumatic Open tibial fracture Orthopedic surgery consulted, status post intramedullary nailing of the tibia with irrigation and debridement on 04/10/2021 DVT PPx, pain management as per orthopedics (continue PTA Coumadin, Plavix) PT/OT  Acute metabolic encephalopathy Hospital delirium, underlying dementia Improving Possibly multifactorial, recent surgery, pain meds CT head unremarkable Chest x-ray unremarkable Monitor closely, delirium precautions  Acute blood loss anemia/chronic normocytic anemia Likely 2/2 recent surgery Daily cbc  Chronic kidney disease stage IIIa Stable Her baseline creatinine is around 0.9-1.2 Daily BMP  Advanced combined chronic diastolic heart failure Appears euvolemic Restart home Lasix at a lower dose Monitor BMP closely  COPD without exacerbation Stable, room air Continue current home inhalers  CVA/CAD with residual left weakness/hypertension/chronic atrial fibrillation on Coumadin: Continue Coumadin, orthopedics okay to restart Plavix as well  Advanced vascular dementia Family at bedside she is at high risk of delirium sundowning and aspiration pneumonia  Hypothyroidism Continue  Synthroid  Depression/anxiety: Resume home regimen postoperatively  Moderate protein caloric malnutrition: Noted  Disposition The patient will need to go home with hospice.     DVT prophylaxis: coumadin Family Communication:daughter Status is: Inpatient Remains inpatient appropriate because: Level of care      Code Status:     Code Status Orders  (From admission, onward)           Start     Ordered   04/09/21 1647  Do not attempt resuscitation (DNR)  Continuous       Question Answer Comment  In the event of cardiac or respiratory ARREST Do not call a code blue   In the event of cardiac or respiratory ARREST Do not perform Intubation, CPR, defibrillation or ACLS   In the event of cardiac or respiratory ARREST Use medication by any route, position, wound care, and other measures to relive pain and suffering. May use oxygen, suction and manual treatment of airway obstruction as needed for comfort.      04/09/21 1649           Code Status History     Date Active Date Inactive Code Status Order ID Comments User Context   11/01/2020 1429 11/08/2020 2346 DNR [id number]  Guilford Shi, MD ED   07/24/2019 1714 08/04/2019 2004 DNR [id number]  Patrecia Pour, MD Inpatient   07/23/2019 2356 07/24/2019 1713 Full Code [id number]  Mansy, Arvella Merles, MD ED   04/28/2019 1656 04/30/2019 2027 Full Code [id number]  Georganna Skeans, MD ED   03/04/2017 2337 03/07/2017 2022 DNR [id number]  Rise Patience, MD ED   02/06/2017 1003 02/17/2017 2133 DNR [id number]  Debbe Odea, MD ED   01/02/2017 0042 01/03/2017 1901 Full Code [id number]  Oswald Hillock, MD Inpatient   01/02/2017 0024 01/02/2017 0042 DNR  [id number]  Oswald Hillock, MD Inpatient   07/27/2014 1610 07/30/2014 1241 Full Code [id number]  Luanne Bras, MD Inpatient   06/30/2014 1421 07/01/2014 0316 Full Code [id number]  Luanne Bras, MD HOV   02/13/2013 0111 02/19/2013 1418 Full Code [id number]  Toy Baker, MD Inpatient          Procedures and diagnostic studies:   CT HEAD WO CONTRAST (5MM)  Result Date: 04/12/2021 CLINICAL DATA:  87 year old female with delirium. Recent lower extremity ORIF. EXAM: CT HEAD WITHOUT CONTRAST TECHNIQUE: Contiguous axial images were obtained from the base of the skull through the vertex without intravenous contrast. RADIATION DOSE REDUCTION: This exam was performed according to the departmental dose-optimization program which includes automated exposure control, adjustment of the mA and/or kV according to patient size and/or use of iterative reconstruction technique. COMPARISON:  Head CT 11/01/2020.  Brain MRI 06/22/2016. FINDINGS: Brain: Chronic ventriculomegaly is nonspecific but stable. No midline shift, ventriculomegaly, mass effect, evidence of mass lesion, intracranial hemorrhage or evidence of cortically based acute infarction. Mild for age patchy periventricular white matter hypodensity is stable from last year. Vascular: Calcified atherosclerosis at the skull base. No suspicious intracranial vascular hyperdensity. Skull: Mild motion artifact. No acute osseous abnormality identified. Sinuses/Orbits: Mild to moderate new ethmoid sinus, right maxillary sinus mucosal thickening. Tympanic cavities and mastoids remain clear. Other: No acute orbit or scalp soft tissue finding. IMPRESSION: 1. No acute intracranial abnormality. Stable non contrast CT appearance of the brain since last year. 2. New mild to moderate paranasal sinus inflammation. Electronically Signed   By: Genevie Ann M.D.   On: 04/12/2021 09:04   DG CHEST PORT 1 VIEW  Result Date: 04/12/2021 CLINICAL DATA:  87 year old female with confusion and shortness of breath. EXAM: PORTABLE CHEST 1 VIEW COMPARISON:  Portable chest 04/09/2021 and earlier. FINDINGS: Portable AP semi upright view at 0807 hours. Stable lung volumes. Prior CABG. Normal cardiac size and mediastinal contours. Visualized tracheal air column is within normal limits.  Continued veiling opacity at the left lung base, mild. No pulmonary edema and elsewhere Allowing for portable technique the lungs are clear. No pneumothorax. No acute osseous abnormality identified. Paucity of bowel gas in the upper abdomen. IMPRESSION: Small left pleural effusion suspected and stable since 04/09/2021. No other acute cardiopulmonary abnormality. Electronically Signed   By: Genevie Ann M.D.   On: 04/12/2021 09:06     Medical Consultants:   Orthopedics   Subjective:    Amber [name] noted to be sleepy, but was arousable.  Was able to communicate with me, and follow simple commands, but immediately drifts back to sleep.  Noted some postop pain  Objective:    Vitals:   04/12/21 0909 04/12/21 1900 04/13/21 0739 04/13/21 1005  BP:  140/79 (!) 127/58 (!) 115/52  Pulse:  100 90 88  Resp:  17 16   Temp:  98.9 F (37.2 C) 98.2 F (36.8 C)   TempSrc:  Oral Oral   SpO2: 92% 95% 92%   Weight:      Height:       SpO2: 92 % O2 Flow Rate (L/min): 1 L/min   Intake/Output Summary (Last 24 hours) at 04/13/2021 1454 Last data filed at 04/13/2021 0000 Gross per 24 hour  Intake 720 ml  Output 600 ml  Net 120 ml   Filed Weights   04/10/21 1003  Weight: 61.7 kg    Exam: General: NAD, sleepy, elderly, obeys simple commands Cardiovascular: S1, S2 present, noted murmur Respiratory: CTAB Abdomen:  Soft, nontender, nondistended, bowel sounds present Musculoskeletal: No L pedal edema noted, some minimal right ankle swelling Skin: Normal Psychiatry: Normal mood   Data Reviewed:    Labs: Basic Metabolic Panel: Recent Labs  Lab 04/09/21 1415 04/10/21 0341 04/11/21 0300 04/12/21 0215 04/13/21 0222  NA 144 142 139 138 138  K 4.2 3.7 3.2* 4.1 3.8  CL 107 106 103 103 102  CO2 '27 24 28 24 26  '$ GLUCOSE 132* 117* 140* 120* 193*  BUN '14 12 12 11 17  '$ CREATININE 1.12* 0.91 1.02* 0.96 0.92  CALCIUM 9.0 8.5* 8.1* 8.1* 8.4*   GFR Estimated Creatinine Clearance: 41 mL/min (by C-G  formula based on SCr of 0.92 mg/dL). Liver Function Tests: Recent Labs  Lab 04/10/21 0341  AST 23  ALT 14  ALKPHOS 47  BILITOT 0.7  PROT 5.4*  ALBUMIN 2.9*   No results for input(s): LIPASE, AMYLASE in the last 168 hours. No results for input(s): AMMONIA in the last 168 hours. Coagulation profile Recent Labs  Lab 04/09/21 1415 04/10/21 0341 04/11/21 0300 04/12/21 0215 04/13/21 0222  INR 2.0* 2.3* 2.3* 2.1* 1.8*   COVID-19 Labs  No results for input(s): DDIMER, FERRITIN, LDH, CRP in the last 72 hours.  Lab Results  Component Value Date   SARSCOV2NAA NEGATIVE 04/09/2021   [place] NEGATIVE 11/01/2020   [place] NEGATIVE 07/23/2019   [place] NEGATIVE 04/28/2019    CBC: Recent Labs  Lab 04/09/21 1415 04/10/21 0341 04/11/21 0300 04/12/21 0215  WBC 5.6 7.8 9.6 10.6*  HGB 9.2* 10.3* 9.2* 8.9*  HCT 28.8* 32.2* 28.0* 28.5*  MCV 93.8 92.5 91.2 92.5  PLT 262 277 253 256   Cardiac Enzymes: No results for input(s): CKTOTAL, CKMB, CKMBINDEX, TROPONINI in the last 168 hours. BNP (last 3 results) Recent Labs    09/06/20 1040  PROBNP 5,862*   CBG: No results for input(s): GLUCAP in the last 168 hours. D-Dimer: No results for input(s): DDIMER in the last 72 hours. Hgb A1c: No results for input(s): HGBA1C in the last 72 hours. Lipid Profile: No results for input(s): CHOL, HDL, LDLCALC, TRIG, CHOLHDL, LDLDIRECT in the last 72 hours. Thyroid function studies: No results for input(s): TSH, T4TOTAL, T3FREE, THYROIDAB in the last 72 hours.  Invalid input(s): FREET3 Anemia work up: No results for input(s): VITAMINB12, FOLATE, FERRITIN, TIBC, IRON, RETICCTPCT in the last 72 hours. Sepsis Labs: Recent Labs  Lab 04/09/21 1415 04/10/21 0341 04/11/21 0300 04/12/21 0215  WBC 5.6 7.8 9.6 10.6*   Microbiology Recent Results (from the past 240 hour(s))  Resp Panel by RT-PCR (Flu A&B, Covid) Nasopharyngeal Swab     Status: None   Collection Time: 04/09/21   2:00 PM   Specimen: Nasopharyngeal Swab; Nasopharyngeal(NP) swabs in vial transport medium  Result Value Ref Range Status   SARS Coronavirus 2 by RT PCR NEGATIVE NEGATIVE Final    Comment: (NOTE) SARS-CoV-2 target nucleic acids are NOT DETECTED.  The SARS-CoV-2 RNA is generally detectable in upper respiratory specimens during the acute phase of infection. The lowest concentration of SARS-CoV-2 viral copies this assay can detect is 138 copies/mL. A negative result does not preclude SARS-Cov-2 infection and should not be used as the sole basis for treatment or other patient management decisions. A negative result may occur with  improper specimen collection/handling, submission of specimen other than nasopharyngeal swab, presence of viral mutation(s) within the areas targeted by this assay, and inadequate number of viral copies(<138 copies/mL). A negative result must be combined with clinical  observations, patient history, and epidemiological information. The expected result is Negative.  Fact Sheet for Patients:  EntrepreneurPulse.com.au  Fact Sheet for Healthcare Providers:  IncredibleEmployment.be  This test is no t yet approved or cleared by the [place] FDA and  has been authorized for detection and/or diagnosis of SARS-CoV-2 by FDA under an Emergency Use Authorization (EUA). This EUA will remain  in effect (meaning this test can be used) for the duration of the COVID-19 declaration under Section 564(b)(1) of the Act, 21 U.S.C.section 360bbb-3(b)(1), unless the authorization is terminated  or revoked sooner.       Influenza A by PCR NEGATIVE NEGATIVE Final   Influenza B by PCR NEGATIVE NEGATIVE Final    Comment: (NOTE) The Xpert Xpress SARS-CoV-2/FLU/RSV plus assay is intended as an aid in the diagnosis of influenza from Nasopharyngeal swab specimens and should not be used as a sole basis for treatment. Nasal washings and aspirates  are unacceptable for Xpert Xpress SARS-CoV-2/FLU/RSV testing.  Fact Sheet for Patients: EntrepreneurPulse.com.au  Fact Sheet for Healthcare Providers: IncredibleEmployment.be  This test is not yet approved or cleared by the [place] FDA and has been authorized for detection and/or diagnosis of SARS-CoV-2 by FDA under an Emergency Use Authorization (EUA). This EUA will remain in effect (meaning this test can be used) for the duration of the COVID-19 declaration under Section 564(b)(1) of the Act, 21 U.S.C. section 360bbb-3(b)(1), unless the authorization is terminated or revoked.  Performed at [facility], [place] [address], [place], [place] [address]      Medications:    acetaminophen  650 mg Oral Q6H   allopurinol  300 mg Oral QHS   citalopram  20 mg Oral Daily   docusate sodium  100 mg Oral BID   donepezil  5 mg Oral Daily   feeding supplement  237 mL Oral BID BM   furosemide  80 mg Oral Daily   levothyroxine  25 mcg Oral Q0600   memantine  10 mg Oral BID   metoprolol succinate  25 mg Oral Daily   warfarin  7.5 mg Oral ONCE-1600   Warfarin - Pharmacist Dosing Inpatient   Does not apply q1600   Continuous Infusions:      LOS: 4 days   Alma Friendly, MD  [facility]  04/13/2021, 2:54 PM

## 2021-04-13 NOTE — Plan of Care (Signed)
  Problem: Education: Goal: Knowledge of General Education information will improve Description Including pain rating scale, medication(s)/side effects and non-pharmacologic comfort measures Outcome: Progressing   

## 2021-04-14 DIAGNOSIS — J439 Emphysema, unspecified: Secondary | ICD-10-CM | POA: Diagnosis not present

## 2021-04-14 DIAGNOSIS — D62 Acute posthemorrhagic anemia: Secondary | ICD-10-CM | POA: Diagnosis not present

## 2021-04-14 DIAGNOSIS — I5042 Chronic combined systolic (congestive) and diastolic (congestive) heart failure: Secondary | ICD-10-CM | POA: Diagnosis not present

## 2021-04-14 DIAGNOSIS — S82301B Unspecified fracture of lower end of right tibia, initial encounter for open fracture type I or II: Secondary | ICD-10-CM | POA: Diagnosis not present

## 2021-04-14 LAB — BASIC METABOLIC PANEL
Anion gap: 8 (ref 5–15)
BUN: 19 mg/dL (ref 8–23)
CO2: 28 mmol/L (ref 22–32)
Calcium: 8.4 mg/dL — ABNORMAL LOW (ref 8.9–10.3)
Chloride: 102 mmol/L (ref 98–111)
Creatinine, Ser: 0.89 mg/dL (ref 0.44–1.00)
GFR, Estimated: >60 mL/min (ref >60)
Glucose, Bld: 125 mg/dL — ABNORMAL HIGH (ref 70–99)
Potassium: 3.9 mmol/L (ref 3.5–5.1)
Sodium: 138 mmol/L (ref 135–145)

## 2021-04-14 LAB — CBC WITH DIFFERENTIAL/PLATELET
Abs Immature Granulocytes: 0.03 10*3/uL (ref 0.00–0.07)
Basophils Absolute: 0 10*3/uL (ref 0.0–0.1)
Basophils Relative: 1 %
Eosinophils Absolute: 0.3 10*3/uL (ref 0.0–0.5)
Eosinophils Relative: 5 %
HCT: 28 % — ABNORMAL LOW (ref 36.0–46.0)
Hemoglobin: 8.8 g/dL — ABNORMAL LOW (ref 12.0–15.0)
Immature Granulocytes: 0 %
Lymphocytes Relative: 19 %
Lymphs Abs: 1.4 10*3/uL (ref 0.7–4.0)
MCH: 29.2 pg (ref 26.0–34.0)
MCHC: 31.4 g/dL (ref 30.0–36.0)
MCV: 93 fL (ref 80.0–100.0)
Monocytes Absolute: 0.8 10*3/uL (ref 0.1–1.0)
Monocytes Relative: 10 %
Neutro Abs: 4.8 10*3/uL (ref 1.7–7.7)
Neutrophils Relative %: 65 %
Platelets: 280 10*3/uL (ref 150–400)
RBC: 3.01 MIL/uL — ABNORMAL LOW (ref 3.87–5.11)
RDW: 15.5 % (ref 11.5–15.5)
WBC: 7.3 10*3/uL (ref 4.0–10.5)
nRBC: 0 % (ref 0.0–0.2)

## 2021-04-14 LAB — URINALYSIS, ROUTINE W REFLEX MICROSCOPIC
Bilirubin Urine: NEGATIVE
Glucose, UA: NEGATIVE mg/dL
Hgb urine dipstick: NEGATIVE
Ketones, ur: NEGATIVE mg/dL
Leukocytes,Ua: NEGATIVE
Nitrite: NEGATIVE
Protein, ur: NEGATIVE mg/dL
Specific Gravity, Urine: 1.014 (ref 1.005–1.030)
pH: 5 (ref 5.0–8.0)

## 2021-04-14 LAB — PROTIME-INR
INR: 2.2 — ABNORMAL HIGH (ref 0.8–1.2)
Prothrombin Time: 24.5 seconds — ABNORMAL HIGH (ref 11.4–15.2)

## 2021-04-14 MED ORDER — WARFARIN SODIUM 5 MG PO TABS
5.0000 mg | ORAL_TABLET | Freq: Every day | ORAL | Status: DC
Start: 2021-04-14 — End: 2021-04-15
  Administered 2021-04-14 – 2021-04-15 (×2): 5 mg via ORAL
  Filled 2021-04-14 (×2): qty 1

## 2021-04-14 NOTE — Progress Notes (Signed)
ANTICOAGULATION CONSULT NOTE - Follow Up Consult ? ?Pharmacy Consult for Warfarin ?Indication: atrial fibrillation ? ?Patient Measurements: ?Height: '5\' 5"'$  (165.1 cm) ?Weight: 61.7 kg (136 lb) ?IBW/kg (Calculated) : 57 ? ?Vital Signs: ?Temp: 97.6 ?F (36.4 ?C) (03/05 4970) ?Temp Source: Axillary (03/05 0743) ?BP: 128/56 (03/05 0743) ?Pulse Rate: 79 (03/05 0743) ? ?Labs: ?Recent Labs  ?  04/12/21 ?0215 04/13/21 ?0222 04/14/21 ?0216  ?HGB 8.9*  --  8.8*  ?HCT 28.5*  --  28.0*  ?PLT 256  --  280  ?LABPROT 23.4* 21.1* 24.5*  ?INR 2.1* 1.8* 2.2*  ?CREATININE 0.96 0.92 0.89  ? ? ? ?Estimated Creatinine Clearance: 42.3 mL/min (by C-G formula based on SCr of 0.89 mg/dL). ? ?Assessment: ?87 yr old female on Warfarin prior to admission for atrial fibrillation and hx CVA to continue warfarin s/p I&D and IM nailing for right tibia fracture. Open fracture after fall PTA. ?  ?INR 2.0  on admit 2/28 and 2.3 on 3/1.  Vitamin K 2 mg PO x 1 given on 3/1 am.  Warfarin resumed post-op 3/1.  ? ?INR back in range today. We will resume home dose.  ?  ? PTA Warfarin regimen: 5 mg daily ?  ?Goal of Therapy:  ?INR 2-3 ?Monitor platelets by anticoagulation protocol: Yes ?  ?Plan:  ?Coumadin '5mg'$  PO qday ?Daily PT/INR ? ?Onnie Boer, PharmD, BCIDP, AAHIVP, CPP ?Infectious Disease Pharmacist ?04/14/2021 12:02 PM ? ? ? ? ?

## 2021-04-14 NOTE — Plan of Care (Signed)

## 2021-04-14 NOTE — TOC Transition Note (Signed)
Transition of Care (TOC) - CM/SW Discharge Note ? ? ?Patient Details  ?Name: NGOC DETJEN ?MRN: [id number] ?Date of Birth: Dec 20, 1934 ? ?[facility] of [facility]) CM/SW Contact:  ?Sarabelle Genson, Nonda Lou, RN ?Phone Number: ?04/14/2021, 4:31 PM ? ? ?Clinical Narrative:  [facility] team collaborated with Fabio Pierce and Riesa Pope on discharge needs requested by family. Discussed need for PT while inpatient and the need for PT services while outpatient upon discharge. Anderson Malta spoke with daughter about DME needs for at home. Will continue to monitor for further discharge needs. ? ? ? ?Final next level of care: [place] ?Barriers to Discharge: No Barriers Identified ? ? ?Patient Goals and CMS Choice ?  ?  ?  ? ?Discharge Placement ?  ?           ?  ?  ?  ?  ? ?Discharge Plan and Services ?In-house Referral: Clinical Social Work ?  ?Post Acute Care Choice: Hospice          ?  ?  ?  ?  ?  ?HH Arranged:  Teacher, early years/pre providing hospice services at home.) ?  ?  ?  ?  ? ?Social Determinants of Health (SDOH) Interventions ?  ? ? ?Readmission Risk Interventions ?No flowsheet data found. ? ? ? ? ?

## 2021-04-14 NOTE — Progress Notes (Signed)
Physical Therapy Treatment ?Patient Details ?Name: Amber [name] ?MRN: [id number] ?DOB: 08-18-1934 ?Today's Date: 04/14/2021 ? ? ?History of Present Illness Pt is an 87 y.o. female admitted 04/09/21 after sliding out of her lift chair sustaining open R tibial shaft fx. S/p R tibial IMN and I&D 3/1. PMH includes dementia, CVA (residual L-side weakness), CHF, afib, COPD, CKD, fibromyalgia, OA, CAD, L ankle fx s/p ORIF (10/2020). ? ?  ?PT Comments  ? ? Continuing work on functional mobility and activity tolerance;  Session focused on bed mobility and functional transfers, and pt is showing very nice progress compared to last PT session; Able to getup to EOB with Mod assist, and stood a few time using Stedy and mod assist and second person present for safety; Daughter present and helpful throughout session; Amber [name] likes to go out to eat, and the family's goal for her is to continue to enjoy getting out as long as she can -- they hope she will progress enough to be able to get in and out of a car with min assist;  ? ?Amber [name] has experienced a functional decline compared to her baseline, and she is able to better participate today;  Her family is requesting HHPT services, and that is not unreasonable; TOC and [facility] aware  ?Recommendations for follow up therapy are one component of a multi-disciplinary discharge planning process, led by the attending physician.  Recommendations may be updated based on patient status, additional functional criteria and insurance authorization. ? ?Follow Up Recommendations ? Home health PT (through Hospice) ?  ?  ?Assistance Recommended at Discharge Frequent or constant Supervision/Assistance  ?Patient can return home with the following A lot of help with walking and/or transfers;A lot of help with bathing/dressing/bathroom;Assist for transportation;Help with stairs or ramp for entrance ?  ?Equipment Recommendations ? Other (comment) (hoyer lift -- may not need a hoyer lift)  ?   ?Recommendations for Other Services Other (comment) (Mobility Specialists to follow) ? ? ?  ?Precautions / Restrictions Precautions ?Precautions: Fall;Other (comment) ?Precaution Comments: L AFO w/ shoe in room; R shoe in room as well (R shoe is able to fit around dressings) ?Restrictions ?RLE Weight Bearing: Weight bearing as tolerated  ?  ? ?Mobility ? Bed Mobility ?Overal bed mobility: Needs Assistance ?Bed Mobility: Supine to Sit ?  ?  ?Supine to sit: Mod assist ?  ?  ?General bed mobility comments: HOB elevated; Mod assist to help LEs off of EOB, and handheld assist as well as use of bed pad to sit up and square off hips at EOB ?  ? ?Transfers ?Overall transfer level: Needs assistance ?Equipment used: Ambulation equipment used ?Transfers: Sit to/from Stand ?Sit to Stand: Mod assist, +2 safety/equipment ?  ?  ?  ?  ?  ?General transfer comment: Light mod assist to boost into standing and assist for balance; Stood to stedy with good forward lean and pulling up on bar of stedy; overall good tolerance of bearing weight RLE after initial pain of weight bearing ?  ? ?Ambulation/Gait ?  ?  ?  ?  ?  ?  ?  ?  ? ? ?Stairs ?  ?  ?  ?  ?  ? ? ?Wheelchair Mobility ?  ? ?Modified Rankin (Stroke Patients Only) ?  ? ? ?  ?Balance   ?  ?Sitting balance-Leahy Scale: Fair ?  ?  ?  ?Standing balance-Leahy Scale: Poor ?  ?  ?  ?  ?  ?  ?  ?  ?  ?  ?  ?  ?  ? ?  ?  Cognition Arousal/Alertness: Awake/alert ?Behavior During Therapy: [facility] for tasks assessed/performed ?Overall Cognitive Status: History of cognitive impairments - at baseline ?  ?  ?  ?  ?  ?  ?  ?  ?  ?  ?  ?  ?  ?  ?  ?  ?General Comments: Much more awake and interactive today; pleasantly confused ?  ?  ? ?  ?Exercises   ? ?  ?General Comments General comments (skin integrity, edema, etc.): Daughter and Mobility Specialist present throughout session ?  ?  ? ?Pertinent Vitals/Pain Pain Assessment ?Pain Assessment: Faces ?Faces Pain Scale: Hurts little more ?Pain Location:  RLE with donning shoe, easing R foot to the floor, and with initial weight bearing ?Pain Descriptors / Indicators: Grimacing, Guarding ?Pain Intervention(s): Monitored during session, Repositioned  ? ? ?Home Living   ?  ?  ?  ?  ?  ?  ?  ?  ?  ?   ?  ?Prior Function    ?  ?  ?   ? ?PT Goals (current goals can now be found in the care plan section) Acute Rehab PT Goals ?Patient Stated Goal: have pt return home with hospice and family support ?PT Goal Formulation: With family ?Time For Goal Achievement: 04/25/21 ?Potential to Achieve Goals: Fair ?Progress towards PT goals: Progressing toward goals ? ?  ?Frequency ? ? ? Min 3X/week ? ? ? ?  ?PT Plan Discharge plan needs to be updated  ? ? ?Co-evaluation   ?  ?  ?  ?  ? ?  ?AM-PAC PT "6 Clicks" Mobility   ?Outcome Measure ? Help needed turning from your back to your side while in a flat bed without using bedrails?: A Lot ?Help needed moving from lying on your back to sitting on the side of a flat bed without using bedrails?: A Lot ?Help needed moving to and from a bed to a chair (including a wheelchair)?: A Lot ?Help needed standing up from a chair using your arms (e.g., wheelchair or bedside chair)?: A Lot ?Help needed to walk in hospital room?: Total ?Help needed climbing 3-5 steps with a railing? : Total ?6 Click Score: 10 ? ?  ?End of Session Equipment Utilized During Treatment: Gait belt (and pts' shoes and AFO) ?Activity Tolerance: Patient tolerated treatment well ?Patient left: in chair;with call bell/phone within reach;with family/visitor present;with chair alarm set ?Nurse Communication: Mobility status;Need for lift equipment ?PT Visit Diagnosis: Other abnormalities of gait and mobility (R26.89);Muscle weakness (generalized) (M62.81);Pain ?Pain - Right/Left: Right ?Pain - part of body: Leg ?  ? ? ?Time: 4142-3953 ?PT Time Calculation (min) (ACUTE ONLY): 35 min ? ?Charges:  $Therapeutic Activity: 23-37 mins          ?          ? ?Roney Marion, PT  ?Acute  Rehabilitation Services ?Pager [phone] ?Office [phone] ? ? ? ?[name] ?04/14/2021, 5:21 PM ? ?

## 2021-04-14 NOTE — Progress Notes (Signed)
[place] [address] [facility])  Hospitalized Hospice Patient  ?  ?This is a current [facility] hospice patient with a diagnosis of CHF and CKD. She was transported to ED for an open right tib-fib fracture. Family did notify hospice. She was admitted on 3/1 for a diagnosis of right tibia fracture. Per Dr. Cherie Ouch, [facility] MD,  this is a related hospital admission.  ? ?Visited patient at the bedside, dtr Kelli present. Pt alert and pleasantly confused, more somnolent than normal per [facility]. Vida Roller is concerned that her mother has not been mobilized adequately to return home. Vida Roller tried to mobilize her last night with the help of the staff and states "it did not go well". If her mother cannot get up with some assistance, she will not be able to return home. Prior to her injury, she was able to ambulate with walker as well as a correctional shoe for an abnormal gait that she wore on her left leg. Due to her underlying gait abnormalities, if she cannot get OOB with minimal assistance, the family cannot take her back home. They understand her hospice diagnosis but they are very much interested in getting as much help with mobilization prior to her discharging home. Vida Roller is also concerned that her mother may have a UTI and has asked for follow up on that as well.  ? ?V/S: 97.6, 128/56, HR 79, RR 18 ,SPO2 96% RA ?I&O: none recorded today, for 3.5.23 - 720/600 ?Diagnostics: none ?Labs: RBC 3.01, hgb 8.8, HCT 28, INR 2.2 ?IV/PRN: none ? ?Mrs. Glendenning has been medically optimized, however remains inpatient appropriate as she cannot safely get OOB and the family are not able to take her home until she can somewhat mobilize better. ? ?Problem List: ?- right tib-fib fracture - ORIF on 3.1.23. Per family report, has only been mobilized x 1 and they feel like they cannot care for her at home if she cannot get OOB.  ?- acute metabolic encephalopathy - alert and oriented per baseline, but is still more somnolent than normal ? ?GOC:  Appear to be clear, she is under hospice for heart failure and CKD. Her family would like for her to receive PT/OT when in the hospital. Will have hospice PT see in the home, but can only provide 1-2 sessions that focus on safe transfers. ?D/C planning: cannot return home if unable to get OOB ?IDT: hospice team updated ?Family: updated at the bedside ? ?Venia Carbon BSN, RN ?[facility] Liaison  ?  ?

## 2021-04-14 NOTE — Progress Notes (Signed)
[facility] PROGRESS NOTE    Amber [name]  WPY:[id number] DOB: [birth year] DOA: 04/09/2021 PCP: Antony Contras, MD     HPI Amber [name] is an 87 y.o. female past medical history significant for advanced combined systolic and diastolic heart failure, chronic kidney disease stage III with a CVA with left residual weakness, chronic atrial fibrillation on Coumadin who is in the hospice for advanced heart failure, comes in with R open right tib-fib fracture after what appears to be a traumatic fall from lift chair unwitnessed. Orthopedic surgery was consulted, had surgery on 04/10/21. Of note, pt had a previous fracture of the right ankle with hardware placed on 11/29/2020 removed recently due to infected plates in March 22, 2021.     Assessment/plan  Acute R traumatic Open tibial fracture Orthopedic surgery consulted, status post intramedullary nailing of the tibia with irrigation and debridement on 04/10/2021 DVT PPx, pain management as per orthopedics (continue PTA Coumadin, Plavix) PT/OT  Acute metabolic encephalopathy Hospital delirium, underlying dementia Improving Possibly multifactorial, recent surgery, pain meds CT head unremarkable Chest x-ray unremarkable UA/UC pending collection Monitor closely, delirium precautions  Acute blood loss anemia/chronic normocytic anemia Likely 2/2 recent surgery Daily cbc  Chronic kidney disease stage IIIa Stable Her baseline creatinine is around 0.9-1.2 Daily BMP  Advanced combined chronic diastolic heart failure Appears euvolemic Restart home Lasix at a lower dose Monitor BMP closely  COPD without exacerbation Stable, room air Continue current home inhalers  CVA/CAD with residual left weakness/hypertension/chronic atrial fibrillation on Coumadin: Continue Coumadin, orthopedics okay to restart Plavix as well  Advanced vascular dementia Family at bedside she is at high risk of delirium sundowning and aspiration  pneumonia  Hypothyroidism Continue Synthroid  Depression/anxiety: Resume home regimen postoperatively  Moderate protein caloric malnutrition: Noted  Disposition The patient will need to go home with hospice.     DVT prophylaxis: coumadin Family Communication:daughter Status is: Inpatient Remains inpatient appropriate because: Level of care      Code Status:     Code Status Orders  (From admission, onward)           Start     Ordered   04/09/21 1647  Do not attempt resuscitation (DNR)  Continuous       Question Answer Comment  In the event of cardiac or respiratory ARREST Do not call a code blue   In the event of cardiac or respiratory ARREST Do not perform Intubation, CPR, defibrillation or ACLS   In the event of cardiac or respiratory ARREST Use medication by any route, position, wound care, and other measures to relive pain and suffering. May use oxygen, suction and manual treatment of airway obstruction as needed for comfort.      04/09/21 1649           Code Status History     Date Active Date Inactive Code Status Order ID Comments User Context   11/01/2020 1429 11/08/2020 2346 DNR [id number]  Guilford Shi, MD ED   07/24/2019 1714 08/04/2019 2004 DNR [id number]  Patrecia Pour, MD Inpatient   07/23/2019 2356 07/24/2019 1713 Full Code [id number]  Mansy, Arvella Merles, MD ED   04/28/2019 1656 04/30/2019 2027 Full Code [id number]  Georganna Skeans, MD ED   03/04/2017 2337 03/07/2017 2022 DNR [id number]  Rise Patience, MD ED   02/06/2017 1003 02/17/2017 2133 DNR [id number]  Debbe Odea, MD ED   01/02/2017 0042 01/03/2017 1901 Full Code [id number]  Oswald Hillock, MD Inpatient   01/02/2017 [phone]  01/02/2017 0042 DNR [id number]  Oswald Hillock, MD Inpatient   07/27/2014 1610 07/30/2014 1241 Full Code [id number]  Luanne Bras, MD Inpatient   06/30/2014 1421 07/01/2014 0316 Full Code [id number]  Luanne Bras, MD HOV   02/13/2013 0111 02/19/2013 1418 Full Code [id number]   Toy Baker, MD Inpatient         Procedures and diagnostic studies:   No results found.   Medical Consultants:   Orthopedics   Subjective:    Amber [name] noted to be more awake today, was able to engage in some conversation.  Daughter concerned about her slow progression, had an extensive discussion with daughter about her multiple falls/surgeries and the implications, leading to slower recovery and and unable to get back to previous baseline.  With underlying dementia, patient noted to be much weaker with every surgery.  Daughter requesting [facility] PT/OT, and ED for possible to assist in patient getting stronger and able to at least be able to get out of bed.  Hospice liaison on board  Objective:    Vitals:   04/13/21 1618 04/13/21 2228 04/14/21 0743 04/14/21 1510  BP: (!) 110/57 139/64 (!) 128/56 114/65  Pulse: 78 84 79 76  Resp: '17 20 18 17  '$ Temp: 98.4 F (36.9 C) 98.7 F (37.1 C) 97.6 F (36.4 C) 97.7 F (36.5 C)  TempSrc: Oral Oral Axillary Oral  SpO2: 97% 98% 96% 98%  Weight:      Height:       SpO2: 98 % O2 Flow Rate (L/min): 1 L/min  No intake or output data in the 24 hours ending 04/14/21 1706  Filed Weights   04/10/21 1003  Weight: 61.7 kg    Exam: General: NAD, alert, elderly, obeys simple commands Cardiovascular: S1, S2 present, noted murmur Respiratory: CTAB Abdomen: Soft, nontender, nondistended, bowel sounds present Musculoskeletal: No L pedal edema noted, some minimal right ankle swelling Skin: Normal Psychiatry: Normal mood   Data Reviewed:    Labs: Basic Metabolic Panel: Recent Labs  Lab 04/10/21 0341 04/11/21 0300 04/12/21 0215 04/13/21 0222 04/14/21 0216  NA 142 139 138 138 138  K 3.7 3.2* 4.1 3.8 3.9  CL 106 103 103 102 102  CO2 '24 28 24 26 28  '$ GLUCOSE 117* 140* 120* 193* 125*  BUN '12 12 11 17 19  '$ CREATININE 0.91 1.02* 0.96 0.92 0.89  CALCIUM 8.5* 8.1* 8.1* 8.4* 8.4*   GFR Estimated Creatinine Clearance: 42.3  mL/min (by C-G formula based on SCr of 0.89 mg/dL). Liver Function Tests: Recent Labs  Lab 04/10/21 0341  AST 23  ALT 14  ALKPHOS 47  BILITOT 0.7  PROT 5.4*  ALBUMIN 2.9*   No results for input(s): LIPASE, AMYLASE in the last 168 hours. No results for input(s): AMMONIA in the last 168 hours. Coagulation profile Recent Labs  Lab 04/10/21 0341 04/11/21 0300 04/12/21 0215 04/13/21 0222 04/14/21 0216  INR 2.3* 2.3* 2.1* 1.8* 2.2*   COVID-19 Labs  No results for input(s): DDIMER, FERRITIN, LDH, CRP in the last 72 hours.  Lab Results  Component Value Date   SARSCOV2NAA NEGATIVE 04/09/2021   SARSCOV2NAA NEGATIVE 11/01/2020   [place] NEGATIVE 07/23/2019   [place] NEGATIVE 04/28/2019    CBC: Recent Labs  Lab 04/09/21 1415 04/10/21 0341 04/11/21 0300 04/12/21 0215 04/14/21 0216  WBC 5.6 7.8 9.6 10.6* 7.3  NEUTROABS  --   --   --   --  4.8  HGB 9.2* 10.3* 9.2* 8.9* 8.8*  HCT 28.8* 32.2*  28.0* 28.5* 28.0*  MCV 93.8 92.5 91.2 92.5 93.0  PLT 262 277 253 256 280   Cardiac Enzymes: No results for input(s): CKTOTAL, CKMB, CKMBINDEX, TROPONINI in the last 168 hours. BNP (last 3 results) Recent Labs    09/06/20 1040  PROBNP 5,862*   CBG: No results for input(s): GLUCAP in the last 168 hours. D-Dimer: No results for input(s): DDIMER in the last 72 hours. Hgb A1c: No results for input(s): HGBA1C in the last 72 hours. Lipid Profile: No results for input(s): CHOL, HDL, LDLCALC, TRIG, CHOLHDL, LDLDIRECT in the last 72 hours. Thyroid function studies: No results for input(s): TSH, T4TOTAL, T3FREE, THYROIDAB in the last 72 hours.  Invalid input(s): FREET3 Anemia work up: No results for input(s): VITAMINB12, FOLATE, FERRITIN, TIBC, IRON, RETICCTPCT in the last 72 hours. Sepsis Labs: Recent Labs  Lab 04/10/21 0341 04/11/21 0300 04/12/21 0215 04/14/21 0216  WBC 7.8 9.6 10.6* 7.3   Microbiology Recent Results (from the past 240 hour(s))  Resp Panel by  RT-PCR (Flu A&B, Covid) Nasopharyngeal Swab     Status: None   Collection Time: 04/09/21  2:00 PM   Specimen: Nasopharyngeal Swab; Nasopharyngeal(NP) swabs in vial transport medium  Result Value Ref Range Status   SARS Coronavirus 2 by RT PCR NEGATIVE NEGATIVE Final    Comment: (NOTE) SARS-CoV-2 target nucleic acids are NOT DETECTED.  The SARS-CoV-2 RNA is generally detectable in upper respiratory specimens during the acute phase of infection. The lowest concentration of SARS-CoV-2 viral copies this assay can detect is 138 copies/mL. A negative result does not preclude SARS-Cov-2 infection and should not be used as the sole basis for treatment or other patient management decisions. A negative result may occur with  improper specimen collection/handling, submission of specimen other than nasopharyngeal swab, presence of viral mutation(s) within the areas targeted by this assay, and inadequate number of viral copies(<138 copies/mL). A negative result must be combined with clinical observations, patient history, and epidemiological information. The expected result is Negative.  Fact Sheet for Patients:  EntrepreneurPulse.com.au  Fact Sheet for Healthcare Providers:  IncredibleEmployment.be  This test is no t yet approved or cleared by the [place] FDA and  has been authorized for detection and/or diagnosis of SARS-CoV-2 by FDA under an Emergency Use Authorization (EUA). This EUA will remain  in effect (meaning this test can be used) for the duration of the COVID-19 declaration under Section 564(b)(1) of the Act, 21 U.S.C.section 360bbb-3(b)(1), unless the authorization is terminated  or revoked sooner.       Influenza A by PCR NEGATIVE NEGATIVE Final   Influenza B by PCR NEGATIVE NEGATIVE Final    Comment: (NOTE) The Xpert Xpress SARS-CoV-2/FLU/RSV plus assay is intended as an aid in the diagnosis of influenza from Nasopharyngeal swab  specimens and should not be used as a sole basis for treatment. Nasal washings and aspirates are unacceptable for Xpert Xpress SARS-CoV-2/FLU/RSV testing.  Fact Sheet for Patients: EntrepreneurPulse.com.au  Fact Sheet for Healthcare Providers: IncredibleEmployment.be  This test is not yet approved or cleared by the [place] FDA and has been authorized for detection and/or diagnosis of SARS-CoV-2 by FDA under an Emergency Use Authorization (EUA). This EUA will remain in effect (meaning this test can be used) for the duration of the COVID-19 declaration under Section 564(b)(1) of the Act, 21 U.S.C. section 360bbb-3(b)(1), unless the authorization is terminated or revoked.  Performed at [facility], [place] [address], [place], [place] [address]  Medications:    acetaminophen  650 mg Oral Q6H   allopurinol  300 mg Oral QHS   citalopram  20 mg Oral Daily   clopidogrel  75 mg Oral Daily   docusate sodium  100 mg Oral BID   donepezil  5 mg Oral Daily   feeding supplement  237 mL Oral BID BM   furosemide  80 mg Oral Daily   levothyroxine  25 mcg Oral Q0600   memantine  10 mg Oral BID   metoprolol succinate  25 mg Oral Daily   warfarin  5 mg Oral q1600   Warfarin - Pharmacist Dosing Inpatient   Does not apply q1600   Continuous Infusions:      LOS: 5 days   Alma Friendly, MD  [facility]  04/14/2021, 5:06 PM

## 2021-04-15 DIAGNOSIS — D62 Acute posthemorrhagic anemia: Secondary | ICD-10-CM | POA: Diagnosis not present

## 2021-04-15 DIAGNOSIS — S82301B Unspecified fracture of lower end of right tibia, initial encounter for open fracture type I or II: Secondary | ICD-10-CM | POA: Diagnosis not present

## 2021-04-15 DIAGNOSIS — N1831 Chronic kidney disease, stage 3a: Secondary | ICD-10-CM | POA: Diagnosis not present

## 2021-04-15 DIAGNOSIS — I5042 Chronic combined systolic (congestive) and diastolic (congestive) heart failure: Secondary | ICD-10-CM | POA: Diagnosis not present

## 2021-04-15 LAB — CBC WITH DIFFERENTIAL/PLATELET
Abs Immature Granulocytes: 0.04 10*3/uL (ref 0.00–0.07)
Basophils Absolute: 0 10*3/uL (ref 0.0–0.1)
Basophils Relative: 1 %
Eosinophils Absolute: 0.3 10*3/uL (ref 0.0–0.5)
Eosinophils Relative: 4 %
HCT: 25.6 % — ABNORMAL LOW (ref 36.0–46.0)
Hemoglobin: 8.3 g/dL — ABNORMAL LOW (ref 12.0–15.0)
Immature Granulocytes: 1 %
Lymphocytes Relative: 20 %
Lymphs Abs: 1.2 10*3/uL (ref 0.7–4.0)
MCH: 29.6 pg (ref 26.0–34.0)
MCHC: 32.4 g/dL (ref 30.0–36.0)
MCV: 91.4 fL (ref 80.0–100.0)
Monocytes Absolute: 0.6 10*3/uL (ref 0.1–1.0)
Monocytes Relative: 10 %
Neutro Abs: 3.8 10*3/uL (ref 1.7–7.7)
Neutrophils Relative %: 64 %
Platelets: 256 10*3/uL (ref 150–400)
RBC: 2.8 MIL/uL — ABNORMAL LOW (ref 3.87–5.11)
RDW: 15.5 % (ref 11.5–15.5)
WBC: 5.9 10*3/uL (ref 4.0–10.5)
nRBC: 0 % (ref 0.0–0.2)

## 2021-04-15 LAB — URINE CULTURE: Culture: NO GROWTH

## 2021-04-15 LAB — BASIC METABOLIC PANEL
Anion gap: 8 (ref 5–15)
BUN: 21 mg/dL (ref 8–23)
CO2: 27 mmol/L (ref 22–32)
Calcium: 8.4 mg/dL — ABNORMAL LOW (ref 8.9–10.3)
Chloride: 101 mmol/L (ref 98–111)
Creatinine, Ser: 0.93 mg/dL (ref 0.44–1.00)
GFR, Estimated: >60 mL/min (ref >60)
Glucose, Bld: 139 mg/dL — ABNORMAL HIGH (ref 70–99)
Potassium: 3.6 mmol/L (ref 3.5–5.1)
Sodium: 136 mmol/L (ref 135–145)

## 2021-04-15 LAB — PROTIME-INR
INR: 2.3 — ABNORMAL HIGH (ref 0.8–1.2)
Prothrombin Time: 25.4 seconds — ABNORMAL HIGH (ref 11.4–15.2)

## 2021-04-15 MED ORDER — FLEET ENEMA 7-19 GM/118ML RE ENEM: 1.0000 | ENEMA | Freq: Once | RECTAL | Status: DC

## 2021-04-15 MED ORDER — POLYETHYLENE GLYCOL 3350 17 G PO PACK: 17.0000 g | PACK | Freq: Two times a day (BID) | ORAL | Status: DC

## 2021-04-15 MED ORDER — CLOTRIMAZOLE 2 % VA CREA
1.0000 | TOPICAL_CREAM | Freq: Every day | VAGINAL | Status: DC
  Filled 2021-04-15: qty 21

## 2021-04-15 MED ORDER — FUROSEMIDE 80 MG PO TABS: 120.0000 mg | ORAL_TABLET | Freq: Every morning | ORAL | Status: AC

## 2021-04-15 MED ORDER — SENNOSIDES-DOCUSATE SODIUM 8.6-50 MG PO TABS
1.0000 | ORAL_TABLET | Freq: Two times a day (BID) | ORAL | Status: DC
Start: 2021-04-15 — End: 2021-04-15

## 2021-04-15 MED ORDER — CLOTRIMAZOLE 2 % VA CREA: 1.0000 | TOPICAL_CREAM | Freq: Every day | VAGINAL | 0 refills | Status: AC

## 2021-04-15 NOTE — Progress Notes (Signed)
Occupational Therapy Treatment ?Patient Details ?Name: Amber [name] ?MRN: [id number] ?DOB: [birth year] ?Today's Date: 04/15/2021 ? ? ?History of present illness Pt is an 87 y.o. female admitted 04/09/21 after sliding out of her lift chair sustaining open R tibial shaft fx. S/p R tibial IMN and I&D 3/1. PMH includes dementia, CVA (residual L-side weakness), CHF, afib, COPD, CKD, fibromyalgia, OA, CAD, L ankle fx s/p ORIF (10/2020). (Simultaneous filing. User may not have seen previous data.) ?  ?OT comments ? Pt making progress towards goals, pt able to complete stand pivot transfer x2 to [facility] and chair with mod A+2. Max A for pericare in standing with RW. Pt only oriented to self this session, follows single step instruction with increased time, tactile, and verbal cues. Pt presenting with impairments listed below, will follow.  ? ?Recommendations for follow up therapy are one component of a multi-disciplinary discharge planning process, led by the attending physician.  Recommendations may be updated based on patient status, additional functional criteria and insurance authorization. ?   ?Follow Up Recommendations ? No OT follow up  ?  ?Assistance Recommended at Discharge Frequent or constant Supervision/Assistance  ?Patient can return home with the following ? Two people to help with walking and/or transfers;Two people to help with bathing/dressing/bathroom;Assistance with feeding;Direct supervision/assist for medications management;Direct supervision/assist for financial management;Assist for transportation;Help with stairs or ramp for entrance;Assistance with cooking/housework ?  ?Equipment Recommendations ? Hospital bed;Other (comment) Optician, dispensing)  ?  ?Recommendations for Other Services   ? ?  ?Precautions / Restrictions Precautions ?Precautions: Fall;Other (comment)  ?Precaution Comments: L AFO w/ shoe in room; R shoe in room as well (R shoe is able to fit around dressings)  ?Restrictions ?Weight Bearing  Restrictions: Yes ?RLE Weight Bearing: Weight bearing as tolerated  ? ? ?  ? ?Mobility Bed Mobility ?  ?  ?  ?  ?  ?  ?  ?General bed mobility comments: up in chair upon arrival  ?  ? ?Transfers ?Overall transfer level: Needs assistance  ?Equipment used: Rolling walker (2 wheels)  ?Transfers: Sit to/from Stand  ?Sit to Stand: Mod assist, +2 safety/equipment  ?  ?  ?  ?  ?  ?  ?  ?  ?Balance Overall balance assessment: Needs assistance ?Sitting-balance support: Feet supported ?Sitting balance-Leahy Scale: Fair  ?Sitting balance - Comments: physical assistance needed to lean forward for pillow placement ?  ?Standing balance support: Bilateral upper extremity supported, Reliant on assistive device for balance ?Standing balance-Leahy Scale: Poor  ?  ?  ?  ?  ?  ?  ?  ?  ?  ?  ?  ?  ?   ? ?ADL either performed or assessed with clinical judgement  ? ?ADL Overall ADL's : Needs assistance/impaired ?  ?  ?  ?  ?  ?  ?  ?  ?  ?  ?  ?  ?Toilet Transfer: Moderate assistance;+2 for physical assistance;Rolling walker (2 wheels);Ambulation ?Toilet Transfer Details (indicate cue type and reason): x2 simulated to chair, and then to [facility] ?Toileting- Clothing Manipulation and Hygiene: Maximal assistance;Sit to/from stand ?Toileting - Clothing Manipulation Details (indicate cue type and reason): for pericare ?  ?  ?Functional mobility during ADLs: Rolling walker (2 wheels);Moderate assistance;+2 for physical assistance ?  ?  ? ?Extremity/Trunk Assessment Upper Extremity Assessment ?Upper Extremity Assessment: Generalized weakness ?  ?Lower Extremity Assessment ?Lower Extremity Assessment: Defer to PT evaluation ?  ?  ?  ? ?Vision   ?Vision  Assessment?: No apparent visual deficits ?  ?Perception Perception ?Perception: Not tested ?  ?Praxis Praxis ?Praxis: Not tested ?  ? ?Cognition Arousal/Alertness: Awake/alert  ?Behavior During Therapy: [facility] for tasks assessed/performed  ?Overall Cognitive Status: History of cognitive impairments - at  baseline  ?Area of Impairment: Attention, Following commands, Problem solving, Orientation ?  ?  ?  ?  ?  ?  ?  ?  ?Orientation Level: Place, Time, Situation ?Current Attention Level: Selective ?  ?Following Commands: Follows one step commands inconsistently, Follows one step commands with increased time ?  ?  ?Problem Solving: Slow processing, Decreased initiation, Difficulty sequencing, Requires verbal cues, Requires tactile cues ?General Comments: pt confused, daughter and RN reports this has been present since hospital admission  ?  ?  ?   ?Exercises   ? ?  ?Shoulder Instructions   ? ? ?  ?General Comments daughter present and assisting throughout session   ? ? ?Pertinent Vitals/ Pain       Pain Assessment ?Pain Assessment: No/denies pain ( ?Pain Descriptors / Indicators: Grimacing, Guarding  ?Pain Intervention(s): Limited activity within patient's tolerance, Monitored during session  ? ?Home Living   ?  ?  ?  ?  ?  ?  ?  ?  ?  ?  ?  ?  ?  ?  ?  ?  ?  ?  ? ?  ?Prior Functioning/Environment    ?  ?  ?  ?   ? ?Frequency ? Min 2X/week  ? ? ? ? ?  ?Progress Toward Goals ? ?OT Goals(current goals can now be found in the care plan section) ? Progress towards OT goals: Progressing toward goals ? ?Acute Rehab OT Goals ?Patient Stated Goal: daughter states they would like to increase pt's ability to transfer, states they will assist with ADLs ?OT Goal Formulation: With patient/family ?Time For Goal Achievement: 04/25/21 ?Potential to Achieve Goals: Good ?ADL Goals ?Pt Will Perform Eating: with set-up;with supervision;sitting ?Pt Will Perform Grooming: with min assist;sitting ?Pt Will Transfer to Toilet: with mod assist;stand pivot transfer;bedside commode  ?Plan Discharge plan remains appropriate;Frequency remains appropriate   ? ?Co-evaluation ? ? ?   ?  ?  ?  ?  ? ?  ?AM-PAC OT "6 Clicks" Daily Activity     ?Outcome Measure ? ? Help from another person eating meals?: A Lot ?Help from another person taking care of  personal grooming?: A Lot ?Help from another person toileting, which includes using toliet, bedpan, or urinal?: A Lot ?Help from another person bathing (including washing, rinsing, drying)?: Total ?Help from another person to put on and taking off regular upper body clothing?: A Lot ?Help from another person to put on and taking off regular lower body clothing?: Total ?6 Click Score: 10 ? ?  ?End of Session Equipment Utilized During Treatment: Gait belt;Rolling walker (2 wheels) ? ?OT Visit Diagnosis: Unsteadiness on feet (R26.81);Cognitive communication deficit (R41.841) ?  ?Activity Tolerance Patient limited by lethargy ?  ?Patient Left in chair;with call bell/phone within reach;with chair alarm set;with family/visitor present ?  ?Nurse Communication Mobility status ?  ? ?   ? ?Time: 1130-1157 ?OT Time Calculation (min): 27 min ? ?Charges: OT General Charges ?$OT Visit: 1 Visit ?OT Treatments ?$Self Care/Home Management : 23-37 mins ? ?Lynnda Child, OTD, OTR/L ?Acute Rehab ?[phone] ? ? ?Kaylyn Lim ?04/15/2021, 12:52 PM ?

## 2021-04-15 NOTE — Discharge Instructions (Signed)
? ?[facility] Discharge Instructions ? ? ?General Discharge Instructions ? ?WEIGHT BEARING STATUS:Weightbearing as tolerated right lower extremity ? ?RANGE OF MOTION/ACTIVITY: OK for knee and ankle motion as tolerated ? ?Wound Care: You may remove your surgical dressing. Incisions can be left open to air if there is no drainage. If incision continues to have drainage, follow wound care instructions below. Okay to shower if no drainage from incisions. ? ?DVT/PE prophylaxis: Coumadin and Plavix home dose ? ?Diet: as you were eating previously.  Can use over the counter stool softeners and bowel preparations, such as Miralax, to help with bowel movements.  Narcotics can be constipating.  Be sure to drink plenty of fluids ? ?PAIN MEDICATION USE AND EXPECTATIONS ? You have likely been given narcotic medications to help control your pain.  After a traumatic event that results in an fracture (broken bone) with or without surgery, it is ok to use narcotic pain medications to help control one's pain.  We understand that everyone responds to pain differently and each individual patient will be evaluated on a regular basis for the continued need for narcotic medications. Ideally, narcotic medication use should last no more than 6-8 weeks (coinciding with fracture healing).  ? As a patient it is your responsibility as well to monitor narcotic medication use and report the amount and frequency you use these medications when you come to your office visit.  ? We would also advise that if you are using narcotic medications, you should take a dose prior to therapy to maximize you participation. ? ?IF YOU ARE ON NARCOTIC MEDICATIONS IT IS NOT PERMISSIBLE TO OPERATE A MOTOR VEHICLE (MOTORCYCLE/CAR/TRUCK/MOPED) OR HEAVY MACHINERY ?DO NOT MIX NARCOTICS WITH OTHER CNS (Hand) DEPRESSANTS SUCH AS ALCOHOL ? ? ?STOP SMOKING OR USING NICOTINE PRODUCTS!!!! ? As discussed nicotine severely impairs your body's  ability to heal surgical and traumatic wounds but also impairs bone healing.  Wounds and bone heal by forming microscopic blood vessels (angiogenesis) and nicotine is a vasoconstrictor (essentially, shrinks blood vessels).  Therefore, if vasoconstriction occurs to these microscopic blood vessels they essentially disappear and are unable to deliver necessary nutrients to the healing tissue.  This is one modifiable factor that you can do to dramatically increase your chances of healing your injury.   ? (This means no smoking, no nicotine gum, patches, etc) ? ?DO NOT USE NONSTEROIDAL ANTI-INFLAMMATORY DRUGS (NSAID'S) ? Using products such as Advil (ibuprofen), Aleve (naproxen), Motrin (ibuprofen) for additional pain control during fracture healing can delay and/or prevent the healing response.  If you would like to take over the counter (OTC) medication, Tylenol (acetaminophen) is ok.  However, some narcotic medications that are given for pain control contain acetaminophen as well. Therefore, you should not exceed more than 4000 mg of tylenol in a day if you do not have liver disease.  Also note that there are may OTC medicines, such as cold medicines and allergy medicines that my contain tylenol as well.  If you have any questions about medications and/or interactions please ask your doctor/PA or your pharmacist.  ?   ? ?ICE AND ELEVATE INJURED/OPERATIVE EXTREMITY ? Using ice and elevating the injured extremity above your heart can help with swelling and pain control.  Icing in a pulsatile fashion, such as 20 minutes on and 20 minutes off, can be followed.   ? Do not place ice directly on skin. Make sure there is a barrier between to skin and the ice pack.   ?  Using frozen items such as frozen peas works well as the conform nicely to the are that needs to be iced. ? ?USE AN ACE WRAP OR TED HOSE FOR SWELLING CONTROL ? In addition to icing and elevation, Ace wraps or TED hose are used to help limit and resolve swelling.   It is recommended to use Ace wraps or TED hose until you are informed to stop.   ? When using Ace Wraps start the wrapping distally (farthest away from the body) and wrap proximally (closer to the body) ?  Example: If you had surgery on your leg or thing and you do not have a splint on, start the ace wrap at the toes and work your way up to the thigh ?       If you had surgery on your upper extremity and do not have a splint on, start the ace wrap at your fingers and work your way up to the upper arm ? ? ?CALL THE OFFICE WITH ANY QUESTIONS OR CONCERNS: [phone]  ? ?VISIT OUR WEBSITE FOR ADDITIONAL INFORMATION: [email or web address] ?  ? ? ?Discharge Wound Care Instructions ? ?Do NOT apply any ointments, solutions or lotions to pin sites or surgical wounds.  These prevent needed drainage and even though solutions like hydrogen peroxide kill bacteria, they also damage cells lining the pin sites that help fight infection.  Applying lotions or ointments can keep the wounds moist and can cause them to breakdown and open up as well. This can increase the risk for infection. When in doubt call the office. ? ?Surgical incisions should be dressed daily. ? ?If any drainage is noted, use one layer of adaptic or Mepitel, then gauze, Kerlix, and an ace wrap. ?- These dressing supplies should be available at local medical supply stores [facility], [facility], etc) as well as Management consultant ([facility], [facility], Paediatric nurse, etc) ? ?Once the incision is completely dry and without drainage, it may be left open to air out.  Showering may begin 36-48 hours later.  Cleaning gently with soap and water. ? ?Traumatic wounds should be dressed daily as well.   ? One layer of adaptic, gauze, Kerlix, then ace wrap.  The adaptic can be discontinued once the draining has ceased ? ? ? ?

## 2021-04-15 NOTE — Plan of Care (Signed)
  Problem: Education: Goal: Knowledge of General Education information will improve Description Including pain rating scale, medication(s)/side effects and non-pharmacologic comfort measures Outcome: Adequate for Discharge   Problem: Education: Goal: Knowledge of General Education information will improve Description Including pain rating scale, medication(s)/side effects and non-pharmacologic comfort measures Outcome: Adequate for Discharge   Problem: Health Behavior/Discharge Planning: Goal: Ability to manage health-related needs will improve Outcome: Adequate for Discharge   Problem: Clinical Measurements: Goal: Ability to maintain clinical measurements within normal limits will improve Outcome: Adequate for Discharge Goal: Will remain free from infection Outcome: Adequate for Discharge Goal: Diagnostic test results will improve Outcome: Adequate for Discharge Goal: Respiratory complications will improve Outcome: Adequate for Discharge Goal: Cardiovascular complication will be avoided Outcome: Adequate for Discharge   Problem: Activity: Goal: Risk for activity intolerance will decrease Outcome: Adequate for Discharge   Problem: Nutrition: Goal: Adequate nutrition will be maintained Outcome: Adequate for Discharge   Problem: Coping: Goal: Level of anxiety will decrease Outcome: Adequate for Discharge   Problem: Elimination: Goal: Will not experience complications related to bowel motility Outcome: Adequate for Discharge Goal: Will not experience complications related to urinary retention Outcome: Adequate for Discharge   Problem: Pain Managment: Goal: General experience of comfort will improve Outcome: Adequate for Discharge   Problem: Safety: Goal: Ability to remain free from injury will improve Outcome: Adequate for Discharge   Problem: Skin Integrity: Goal: Risk for impaired skin integrity will decrease Outcome: Adequate for Discharge   

## 2021-04-15 NOTE — Progress Notes (Signed)
Order to discharge pt home.  Discharge instructions/AVS given to daughter, Lambert Keto, at bedside.  Education provided and all questions answered.  Will continue to monitor until EMS arrives for transport.   ?

## 2021-04-15 NOTE — Discharge Summary (Signed)
Physician Discharge Summary   Patient: Amber [name] MRN: [id number] DOB: 09/02/1934  Admit date:     04/09/2021  Discharge date: 04/15/21  Discharge Physician: Alma Friendly   PCP: Antony Contras, MD   Recommendations at discharge:   Follow-up with PCP in 1 week Follow-up with orthopedics as scheduled Home hospice   Discharge Diagnoses: Principal Problem:   Open tibial fracture Active Problems:   Chronic combined systolic and diastolic heart failure (HCC)   Normocytic anemia   Coronary artery disease involving coronary bypass graft of native heart with angina pectoris (HCC)   COPD (chronic obstructive pulmonary disease) (HCC)   Acute postoperative anemia due to expected blood loss   Chronic kidney disease, stage III (moderate) (HCC)   Vascular dementia [facility])   Essential hypertension   Hospital Course: Amber [name] is an 87 y.o. female past medical history significant for advanced combined systolic and diastolic heart failure, chronic kidney disease stage III with a CVA with left residual weakness, chronic atrial fibrillation on Coumadin who is in the hospice for advanced heart failure, comes in with R open right tib-fib fracture after what appears to be a traumatic fall from lift chair unwitnessed. Orthopedic surgery was consulted, had surgery on 04/10/21. Of note, pt had a previous fracture of the right ankle with hardware placed on 11/29/2020 removed recently due to infected plates in March 22, 2021.    Today, discussed extensively with patient's daughter at bedside, all questions answered, all concerns addressed.  Patient will be discharging to home with home hospice.  Patient will also be able to have home health PT/OT.    Assessment and Plan:  Acute R traumatic Open tibial fracture Orthopedic surgery consulted, status post intramedullary nailing of the tibia with irrigation and debridement on 04/10/2021 Continue PTA Coumadin, Plavix ok by Ortho Pain management  with tramadol HH PT/OT   Acute metabolic encephalopathy Hospital delirium, underlying dementia Resolved Possibly multifactorial, recent surgery, pain meds CT head unremarkable Chest x-ray unremarkable UA neg, UC no growth Home hospice  Acute blood loss anemia/chronic normocytic anemia Likely 2/2 recent surgery Follow up with PCP/Hospice  Chronic kidney disease stage IIIa Stable Her baseline creatinine is around 0.9-1.2   Advanced combined chronic diastolic heart failure Appears euvolemic Restart home Lasix Follow up with Cardiology  COPD without exacerbation Stable, room air Continue current home inhalers  CVA/CAD with residual left weakness/hypertension/chronic atrial fibrillation on Coumadin: Continue Coumadin, orthopedics okay to restart Plavix as well  Advanced vascular dementia   Hypothyroidism Continue Synthroid  Depression/anxiety: Resume home regimen   Moderate protein caloric malnutrition: Noted  Possible vaginal candidiasis Clotrimazole suppository daily x3 days   Disposition The patient will need to go home with hospice.        Nutrition Documentation    Flowsheet Row ED to Hosp-Admission (Current) from 04/09/2021 in [place]  Nutrition Problem Increased nutrient needs  Etiology post-op healing, chronic illness  [CHF, COPD]  Nutrition Goal Patient will meet greater than or equal to 90% of their needs  Interventions Ensure Enlive (each supplement provides 350kcal and 20 grams of protein), Hormel Shake, Liberalize Diet        Consultants: Orthopedics Procedures performed: Surgery as mentioned above Disposition: Hospice care Diet recommendation:  Regular diet    DISCHARGE MEDICATION: Allergies as of 04/15/2021       Reactions   Ezetimibe Other (See Comments)   Myalgia   Welchol [colesevelam Hcl] Other (See Comments)  Muscle aches   Adhesive [tape] Itching, Rash, Other (See Comments)    Burning    Ceclor [cefaclor] Rash   Elastic Bandages & [zinc] Rash, Other (See Comments)   Turns red on the areas it touches   Latex Itching, Rash, Other (See Comments)   Burning    Lipitor [atorvastatin Calcium] Other (See Comments)   Increased fibromyalgia pain   Mevacor [lovastatin] Other (See Comments)   Increased fibromyalgia pain   Pravachol Other (See Comments)   Increased fibromyalgia pain   Vasotec Other (See Comments)   Unknown allergic reaction        Medication List     TAKE these medications    allopurinol 300 MG tablet Commonly known as: ZYLOPRIM Take 300 mg by mouth at bedtime.   amLODipine 5 MG tablet Commonly known as: NORVASC TAKE 1 TABLET BY MOUTH EVERY DAY What changed: when to take this   citalopram 20 MG tablet Commonly known as: CELEXA TAKE 1 TABLET BY MOUTH TWICE A DAY What changed: when to take this   clopidogrel 75 MG tablet Commonly known as: PLAVIX TAKE 1 TABLET (75 MG TOTAL) BY MOUTH DAILY.   clotrimazole 2 % vaginal cream Commonly known as: GYNE-LOTRIMIN 3 Place 1 Applicatorful vaginally at bedtime for 3 days.   diphenoxylate-atropine 2.5-0.025 MG tablet Commonly known as: LOMOTIL Take 1 tablet by mouth 3 (three) times daily as needed for diarrhea or loose stools.   donepezil 5 MG tablet Commonly known as: ARICEPT Take 1 tablet (5 mg total) by mouth daily. What changed: when to take this   ergocalciferol 1.25 MG (50000 UT) capsule Commonly known as: VITAMIN D2 Take 50,000 Units by mouth 2 (two) times a week. Tuesdays and Fridays   furosemide 80 MG tablet Commonly known as: LASIX Take 1.5 tablets (120 mg total) by mouth in the morning.   ipratropium-albuterol 0.5-2.5 (3) MG/3ML Soln Commonly known as: DUONEB Take 3 mLs by nebulization every 4 (four) hours as needed (Use at home). What changed: reasons to take this   levothyroxine 25 MCG tablet Commonly known as: SYNTHROID Take 25 mcg by mouth daily before breakfast.    losartan 25 MG tablet Commonly known as: COZAAR Take 1 tablet (25 mg total) by mouth daily.   memantine 10 MG tablet Commonly known as: Namenda Take 1 tablet (10 mg total) by mouth 2 (two) times daily.   metoprolol succinate 25 MG 24 hr tablet Commonly known as: TOPROL-XL TAKE 1 TABLET BY MOUTH EVERY DAY   nitroGLYCERIN 0.4 MG SL tablet Commonly known as: NITROSTAT Place 1 tablet (0.4 mg total) under the tongue every 5 (five) minutes as needed for chest pain.   potassium chloride SA 20 MEQ tablet Commonly known as: Klor-Con M20 Take 2 tablets (40 mEq total) by mouth daily.   traMADol 50 MG tablet Commonly known as: ULTRAM Take 1 tablet (50 mg total) by mouth every 8 (eight) hours as needed for moderate pain or severe pain.   warfarin 5 MG tablet Commonly known as: COUMADIN TAKE AS DIRECTED BY COUMADIN CLINIC What changed: See the new instructions.   ZINC-VITAMIN C PO Take 1 tablet by mouth every evening.        Follow-up Information     Haddix, Thomasene Lot, MD. Schedule an appointment as soon as possible for a visit in 2 week(s).   Specialty: Orthopedic Surgery Why: for wound check and suture removal Contact information: [place] [address] [phone]  Antony Contras, MD. Schedule an appointment as soon as possible for a visit in 1 week(s).   Specialty: Family Medicine Contact information: [address] [place] [address] [phone]         Belva Crome, MD .   Specialty: Cardiology Contact information: [phone] [address] [place] [address] [phone]                Discharge Exam: Danley Danker Weights   04/10/21 1003  Weight: 61.7 kg   General: NAD, elderly, awake, alert, not oriented Cardiovascular: S1, S2 present Respiratory: CTAB Abdomen: Soft, nontender, nondistended, bowel sounds present Musculoskeletal: No bilateral pedal edema noted, noted RLE dressing intact Skin:  Normal Psychiatry: Normal mood   Condition at discharge: stable  The results of significant diagnostics from this hospitalization (including imaging, microbiology, ancillary and laboratory) are listed below for reference.   Imaging Studies: DG Tibia/Fibula Right  Result Date: 04/10/2021 CLINICAL DATA:  Right tibial nail placement.  Operative fluoroscopy. EXAM: RIGHT TIBIA AND FIBULA - 2 VIEW COMPARISON:  04/09/2021 FINDINGS: Images were performed intraoperatively without the presence of a radiologist. Patient appears to be undergoing intramedullary nail fixation of the previously seen comminuted distal tibial fracture. Total fluoroscopy time: 1 minute 9 seconds Total dose: 1.73 mGy Total fluoro images: 5 Please see intraoperative findings for further detail. IMPRESSION: Operative fluoroscopy for intramedullary nail fixation of the tibia. Electronically Signed   By: Yvonne Kendall M.D.   On: 04/10/2021 12:44   DG Ankle Complete Right  Result Date: 03/22/2021 CLINICAL DATA:  Hardware removal. EXAM: RIGHT ANKLE - COMPLETE 3+ VIEW COMPARISON:  Radiographs 11/05/2020. FINDINGS: C-arm fluoroscopy was provided in the operating room. 9 seconds of fluoroscopy time. 0.24 mGy. Three spot fluoroscopic images are submitted from the operating room. Initial image demonstrates no change in the postsurgical hardware. The subsequent images demonstrate hardware removal. No retained hardware or acute osseous findings are identified. There are tibiotalar degenerative changes with posttraumatic deformities of the distal tibia and fibula. IMPRESSION: Successful hardware removal.  No demonstrated complication. Electronically Signed   By: Richardean Sale M.D.   On: 03/22/2021 08:28   CT HEAD WO CONTRAST (5MM)  Result Date: 04/12/2021 CLINICAL DATA:  87 year old female with delirium. Recent lower extremity ORIF. EXAM: CT HEAD WITHOUT CONTRAST TECHNIQUE: Contiguous axial images were obtained from the base of the skull through  the vertex without intravenous contrast. RADIATION DOSE REDUCTION: This exam was performed according to the departmental dose-optimization program which includes automated exposure control, adjustment of the mA and/or kV according to patient size and/or use of iterative reconstruction technique. COMPARISON:  Head CT 11/01/2020.  Brain MRI 06/22/2016. FINDINGS: Brain: Chronic ventriculomegaly is nonspecific but stable. No midline shift, ventriculomegaly, mass effect, evidence of mass lesion, intracranial hemorrhage or evidence of cortically based acute infarction. Mild for age patchy periventricular white matter hypodensity is stable from last year. Vascular: Calcified atherosclerosis at the skull base. No suspicious intracranial vascular hyperdensity. Skull: Mild motion artifact. No acute osseous abnormality identified. Sinuses/Orbits: Mild to moderate new ethmoid sinus, right maxillary sinus mucosal thickening. Tympanic cavities and mastoids remain clear. Other: No acute orbit or scalp soft tissue finding. IMPRESSION: 1. No acute intracranial abnormality. Stable non contrast CT appearance of the brain since last year. 2. New mild to moderate paranasal sinus inflammation. Electronically Signed   By: Genevie Ann M.D.   On: 04/12/2021 09:04   DG CHEST PORT 1 VIEW  Result Date: 04/12/2021 CLINICAL DATA:  87 year old female with confusion and shortness of breath. EXAM: PORTABLE CHEST 1 VIEW COMPARISON:  Portable chest 04/09/2021 and earlier. FINDINGS: Portable AP semi upright view at 0807 hours. Stable lung volumes. Prior CABG. Normal cardiac size and mediastinal contours. Visualized tracheal air column is within normal limits. Continued veiling opacity at the left lung base, mild. No pulmonary edema and elsewhere Allowing for portable technique the lungs are clear. No pneumothorax. No acute osseous abnormality identified. Paucity of bowel gas in the upper abdomen. IMPRESSION: Small left pleural effusion suspected and  stable since 04/09/2021. No other acute cardiopulmonary abnormality. Electronically Signed   By: Genevie Ann M.D.   On: 04/12/2021 09:06   DG Chest Portable 1 View  Result Date: 04/09/2021 CLINICAL DATA:  Fall.  Additional history provided: EXAM: PORTABLE CHEST 1 VIEW COMPARISON:  Prior chest radiographs 12/04/2020 and earlier. FINDINGS: Prior median sternotomy. Mild cardiomegaly, unchanged. Aortic atherosclerosis. Small left pleural effusion. Minimal ill-defined opacity within the left lung base. No appreciable airspace consolidation within the right lung. No evidence of pulmonary edema. No evidence of pneumothorax. No acute bony abnormality identified. IMPRESSION: Small left pleural effusion. Minimal ill-defined opacity within the left lung base, which is favored to reflect atelectasis. However, pneumonia is difficult to exclude and clinical correlation is recommended. Mild cardiomegaly, unchanged. Aortic Atherosclerosis (ICD10-I70.0). Electronically Signed   By: Kellie Simmering D.O.   On: 04/09/2021 15:53   DG Tibia/Fibula Right Port  Result Date: 04/10/2021 CLINICAL DATA:  Tibia fibular fracture, postoperative exam EXAM: PORTABLE RIGHT TIBIA AND FIBULA - 2 VIEW COMPARISON:  04/10/2021 FINDINGS: right tibia intramedullary rod insertion reduces the distal tibia shaft displaced spiral type fracture. Proximal and distal fixation screws. Improved alignment. Fracture lines remain visible. Inferior fibular shaft fracture also noted. Bones are osteopenic. Degenerative changes of the ankle. IMPRESSION: Status post ORIF of the right tibia distal shaft spiral type fracture with improved alignment, near anatomic. Electronically Signed   By: Jerilynn Mages.  Shick M.D.   On: 04/10/2021 13:44   DG Tibia/Fibula Right Port  Result Date: 04/09/2021 CLINICAL DATA:  Slipped out of a recliner, RIGHT ankle injury, laceration EXAM: PORTABLE RIGHT TIBIA AND FIBULA - 2 VIEW COMPARISON:  None FINDINGS: Osseous demineralization. Degenerative  changes at RIGHT ankle joint due to old healed bimalleolar fractures. Knee joint space narrowing, mild. Comminuted oblique fractures of the mid to distal RIGHT tibial diaphysis, mildly displaced. No additional fracture or dislocation. Pin tracks at distal fibula from prior ORIF. IMPRESSION: Displaced oblique distal RIGHT tibial diaphyseal fractures. Osseous demineralization with old posttraumatic deformities of the medial and lateral malleoli as well as prior hardware removal. Electronically Signed   By: Lavonia Dana M.D.   On: 04/09/2021 15:53   DG C-Arm 1-60 Min-No Report  Result Date: 04/10/2021 Fluoroscopy was utilized by the requesting physician.  No radiographic interpretation.   DG C-Arm 1-60 Min-No Report  Result Date: 03/22/2021 Fluoroscopy was utilized by the requesting physician.  No radiographic interpretation.    Microbiology: Results for orders placed or performed during the hospital encounter of 04/09/21  Resp Panel by RT-PCR (Flu A&B, Covid) Nasopharyngeal Swab     Status: None   Collection Time: 04/09/21  2:00 PM   Specimen: Nasopharyngeal Swab; Nasopharyngeal(NP) swabs in vial transport medium  Result Value Ref Range Status   SARS Coronavirus 2 by RT PCR NEGATIVE NEGATIVE Final    Comment: (NOTE) SARS-CoV-2 target nucleic acids are NOT DETECTED.  The SARS-CoV-2 RNA is generally detectable in upper respiratory specimens during the  acute phase of infection. The lowest concentration of SARS-CoV-2 viral copies this assay can detect is 138 copies/mL. A negative result does not preclude SARS-Cov-2 infection and should not be used as the sole basis for treatment or other patient management decisions. A negative result may occur with  improper specimen collection/handling, submission of specimen other than nasopharyngeal swab, presence of viral mutation(s) within the areas targeted by this assay, and inadequate number of viral copies(<138 copies/mL). A negative result must be  combined with clinical observations, patient history, and epidemiological information. The expected result is Negative.  Fact Sheet for Patients:  EntrepreneurPulse.com.au  Fact Sheet for Healthcare Providers:  IncredibleEmployment.be  This test is no t yet approved or cleared by the [place] FDA and  has been authorized for detection and/or diagnosis of SARS-CoV-2 by FDA under an Emergency Use Authorization (EUA). This EUA will remain  in effect (meaning this test can be used) for the duration of the COVID-19 declaration under Section 564(b)(1) of the Act, 21 U.S.C.section 360bbb-3(b)(1), unless the authorization is terminated  or revoked sooner.       Influenza A by PCR NEGATIVE NEGATIVE Final   Influenza B by PCR NEGATIVE NEGATIVE Final    Comment: (NOTE) The Xpert Xpress SARS-CoV-2/FLU/RSV plus assay is intended as an aid in the diagnosis of influenza from Nasopharyngeal swab specimens and should not be used as a sole basis for treatment. Nasal washings and aspirates are unacceptable for Xpert Xpress SARS-CoV-2/FLU/RSV testing.  Fact Sheet for Patients: EntrepreneurPulse.com.au  Fact Sheet for Healthcare Providers: IncredibleEmployment.be  This test is not yet approved or cleared by the [place] FDA and has been authorized for detection and/or diagnosis of SARS-CoV-2 by FDA under an Emergency Use Authorization (EUA). This EUA will remain in effect (meaning this test can be used) for the duration of the COVID-19 declaration under Section 564(b)(1) of the Act, 21 U.S.C. section 360bbb-3(b)(1), unless the authorization is terminated or revoked.  Performed at [facility], [place] [address], [place], [place] [address]   Urine Culture     Status: None   Collection Time: 04/14/21  5:02 PM   Specimen: In/Out Cath Urine  Result Value Ref Range Status   Specimen Description IN/OUT CATH  URINE  Final   Special Requests NONE  Final   Culture   Final    NO GROWTH Performed at [facility], [place] [address] [place],  [address]    Report Status 04/15/2021 FINAL  Final   *Note: Due to a large number of results and/or encounters for the requested time period, some results have not been displayed. A complete set of results can be found in Results Review.    Labs: CBC: Recent Labs  Lab 04/10/21 0341 04/11/21 0300 04/12/21 0215 04/14/21 0216 04/15/21 0203  WBC 7.8 9.6 10.6* 7.3 5.9  NEUTROABS  --   --   --  4.8 3.8  HGB 10.3* 9.2* 8.9* 8.8* 8.3*  HCT 32.2* 28.0* 28.5* 28.0* 25.6*  MCV 92.5 91.2 92.5 93.0 91.4  PLT 277 253 256 280 335   Basic Metabolic Panel: Recent Labs  Lab 04/11/21 0300 04/12/21 0215 04/13/21 0222 04/14/21 0216 04/15/21 0203  NA 139 138 138 138 136  K 3.2* 4.1 3.8 3.9 3.6  CL 103 103 102 102 101  CO2 '28 24 26 28 27  '$ GLUCOSE 140* 120* 193* 125* 139*  BUN '12 11 17 19 21  '$ CREATININE 1.02* 0.96 0.92 0.89 0.93  CALCIUM 8.1*  8.1* 8.4* 8.4* 8.4*   Liver Function Tests: Recent Labs  Lab 04/10/21 0341  AST 23  ALT 14  ALKPHOS 47  BILITOT 0.7  PROT 5.4*  ALBUMIN 2.9*   CBG: No results for input(s): GLUCAP in the last 168 hours.  Discharge time spent: greater than 30 minutes.  Signed: Alma Friendly, MD [facility] 04/15/2021

## 2021-04-15 NOTE — TOC Transition Note (Signed)
Transition of Care (TOC) - CM/SW Discharge Note ? ? ?Patient Details  ?Name: ELZABETH MCQUERRY ?MRN: [id number] ?Date of Birth: [birth year] ? ?Transition of Care [facility]) CM/SW Contact:  ?[name] Chars, LCSW ?Phone Number: ?04/15/2021, 4:04 PM ? ? ?Clinical Narrative:   Pt discharging home with [facility].  [facility] will provide transportation.   ? ? ? ?Final next level of care: [facility] ?Barriers to Discharge: Barriers Resolved ? ? ?Patient Goals and CMS Choice ?  ?  ?  ? ?Discharge Placement ?  ?           ?  ?Patient to be transferred to facility by: [facility] ?Name of family member notified: daughter in room ?Patient and family notified of of transfer: 04/15/21 ? ?Discharge Plan and Services ?In-house Referral: Clinical Social Work ?  ?Post Acute Care Choice: Hospice          ?  ?  ?  ?  ?  ?HH Arranged:  Teacher, early years/pre providing hospice services at home.) ?  ?  ?  ?  ? ?Social Determinants of Health (SDOH) Interventions ?  ? ? ?Readmission Risk Interventions ?No flowsheet data found. ? ? ? ? ?

## 2021-04-15 NOTE — Progress Notes (Signed)
Mobility Specialist Progress Note  ? ? 04/15/21 1553  ?Mobility  ?Activity Transferred to/from [facility]  ?Level of Assistance +2 (takes two people)  ?Assistive Device Four wheel walker  ?RLE Weight Bearing WBAT  ?Activity Response Tolerated fair  ?$Mobility charge 1 Mobility  ? ?Pt received in chair and agreeable. Had void and BM. Pt managed to take a few steps forward with +2 HHA. Returned to sitting EOB with daughter and RN present.  ? ?Hildred Alamin ?Mobility Specialist  ?M.S. 5N: [phone]  ?

## 2021-04-15 NOTE — Progress Notes (Signed)
ANTICOAGULATION CONSULT NOTE - Follow Up Consult ? ?Pharmacy Consult for Warfarin ?Indication: atrial fibrillation ? ?Patient Measurements: ?Height: '5\' 5"'$  (165.1 cm) ?Weight: 61.7 kg (136 lb) ?IBW/kg (Calculated) : 57 ? ?Vital Signs: ?Temp: 98 ?F (36.7 ?C) (03/06 0534) ?BP: 146/57 (03/06 0534) ?Pulse Rate: 84 (03/06 0534) ? ?Labs: ?Recent Labs  ?  04/13/21 ?0222 04/14/21 ?0216 04/15/21 ?0203  ?HGB  --  8.8* 8.3*  ?HCT  --  28.0* 25.6*  ?PLT  --  280 256  ?LABPROT 21.1* 24.5* 25.4*  ?INR 1.8* 2.2* 2.3*  ?CREATININE 0.92 0.89 0.93  ? ? ? ?Estimated Creatinine Clearance: 40.5 mL/min (by C-G formula based on SCr of 0.93 mg/dL). ? ?Assessment: ?87 yr old female on Warfarin prior to admission for atrial fibrillation and hx CVA to continue warfarin s/p I&D and IM nailing for right tibia fracture. Open fracture after fall PTA. ?  ?INR 2.0 on admit 2/28.  Vitamin K 2 mg PO x 1 given on 3/1 am.  Pt went to OR 3/1.  Warfarin resumed post-op 3/1.  ? ?INR remains therapeutic on home regimen. ?  ?PTA Warfarin regimen: 5 mg daily ?  ?Goal of Therapy:  ?INR 2-3 ?Monitor platelets by anticoagulation protocol: Yes ?  ?Plan:  ?Continue Coumadin '5mg'$  PO qday ?Daily PT/INR ? ?[employer], Pharm.D., BCPS ?Clinical Pharmacist ?Clinical phone for 04/15/2021 from 7:30-3:00 is [phone]. ? ?**Pharmacist phone directory can be found on [email or web address] listed under [place]. ? ?04/15/2021 10:26 AM ? ? ? ? ?

## 2021-04-15 NOTE — Progress Notes (Signed)
Physical Therapy Treatment ?Patient Details ?Name: Amber [name] ?MRN: [id number] ?DOB: May 29, 1934 ?Today's Date: 04/15/2021 ? ? ?History of Present Illness Pt is an 87 y.o. female admitted 04/09/21 after sliding out of her lift chair sustaining open R tibial shaft fx. S/p R tibial IMN and I&D 3/1. PMH includes dementia, CVA (residual L-side weakness), CHF, afib, COPD, CKD, fibromyalgia, OA, CAD, L ankle fx s/p ORIF (10/2020). ? ?  ?PT Comments  ? ? Continuing work on functional mobility and activity tolerance;  Session focused on functional transfers, and pt was able to perform bed to recliner, then recliner to/from [facility] trnsfers with her own Rollator RW and mod/max assist; Overall tolerating transfers well;  ? ?For smoothness of transition home, recommend non-emergent ambulance transport   ?Recommendations for follow up therapy are one component of a multi-disciplinary discharge planning process, led by the attending physician.  Recommendations may be updated based on patient status, additional functional criteria and insurance authorization. ? ?Follow Up Recommendations ? Home health PT (through Hospice) ?  ?  ?Assistance Recommended at Discharge Frequent or constant Supervision/Assistance  ?Patient can return home with the following A lot of help with walking and/or transfers;A lot of help with bathing/dressing/bathroom;Assist for transportation;Help with stairs or ramp for entrance ?  ?Equipment Recommendations ? Other (comment) (hoyer lift -- may not need a hoyer lift)  ?  ?Recommendations for Other Services Other (comment) (Mobility Specialists to follow) ? ? ?  ?Precautions / Restrictions Precautions ?Precautions: Fall;Other (comment) ?Precaution Comments: L AFO w/ shoe in room; R shoe in room as well (R shoe is able to fit around dressings) ?Restrictions ?RLE Weight Bearing: Weight bearing as tolerated  ?  ? ?Mobility ? Bed Mobility ?Overal bed mobility: Needs Assistance ?Bed Mobility: Supine to Sit ?  ?  ?Supine  to sit: Mod assist ?  ?  ?General bed mobility comments: Mod assist to help LEs off of EOB, and handheld assist as well as use of bed pad to sit up and square off hips at EOB ?  ? ?Transfers ?Overall transfer level: Needs assistance ?Equipment used: Rollator (4 wheels) (pt's own rollator) ?Transfers: Sit to/from Stand, Bed to chair/wheelchair/BSC ?Sit to Stand: Mod assist, Max assist ?  ?Step pivot transfers: Mod assist ?  ?  ?  ?General transfer comment: Multimodal cueing for technique and to initiate; Mod assist to power up and steady; Max assist to control descent to sit; Mod asisst to maintain balance with pivotal steps bed to recliner, then to [facility], then back to recliner after cleaning up ?  ? ?Ambulation/Gait ?  ?  ?  ?  ?  ?  ?  ?  ? ? ?Stairs ?  ?  ?  ?  ?  ? ? ?Wheelchair Mobility ?  ? ?Modified Rankin (Stroke Patients Only) ?  ? ? ?  ?Balance   ?  ?Sitting balance-Leahy Scale: Fair ?  ?  ?  ?Standing balance-Leahy Scale: Poor ?  ?  ?  ?  ?  ?  ?  ?  ?  ?  ?  ?  ?  ? ?  ?Cognition Arousal/Alertness: Awake/alert ?Behavior During Therapy: [facility] for tasks assessed/performed ?Overall Cognitive Status: History of cognitive impairments - at baseline ?  ?  ?  ?  ?  ?  ?  ?  ?  ?  ?  ?  ?  ?  ?  ?  ?General Comments: Much more awake and interactive today; pleasantly confused ?  ?  ? ?  ?  Exercises   ? ?  ?General Comments General comments (skin integrity, edema, etc.): Daughter present and helpful during session ?  ?  ? ?Pertinent Vitals/Pain Pain Assessment ?Pain Assessment: Faces ?Faces Pain Scale: Hurts little more ?Pain Location: RLE with donning shoe, easing R foot to the floor, and with initial weight bearing ?Pain Descriptors / Indicators: Grimacing, Guarding ?Pain Intervention(s): Monitored during session, Repositioned  ? ? ?Home Living   ?  ?  ?  ?  ?  ?  ?  ?  ?  ?   ?  ?Prior Function    ?  ?  ?   ? ?PT Goals (current goals can now be found in the care plan section) Acute Rehab PT Goals ?Patient Stated Goal:  have pt return home with hospice and family support ?PT Goal Formulation: With family ?Time For Goal Achievement: 04/25/21 ?Potential to Achieve Goals: Fair ?Progress towards PT goals: Progressing toward goals ? ?  ?Frequency ? ? ? Min 3X/week ? ? ? ?  ?PT Plan Current plan remains appropriate  ? ? ?Co-evaluation   ?  ?  ?  ?  ? ?  ?AM-PAC PT "6 Clicks" Mobility   ?Outcome Measure ? Help needed turning from your back to your side while in a flat bed without using bedrails?: A Lot ?Help needed moving from lying on your back to sitting on the side of a flat bed without using bedrails?: A Lot ?Help needed moving to and from a bed to a chair (including a wheelchair)?: A Lot ?Help needed standing up from a chair using your arms (e.g., wheelchair or bedside chair)?: A Lot ?Help needed to walk in hospital room?: Total ?Help needed climbing 3-5 steps with a railing? : Total ?6 Click Score: 10 ? ?  ?End of Session Equipment Utilized During Treatment: Gait belt (and pts' shoes and AFO) ?Activity Tolerance: Patient tolerated treatment well ?Patient left: in chair;with call bell/phone within reach;with family/visitor present ?Nurse Communication: Mobility status ?PT Visit Diagnosis: Other abnormalities of gait and mobility (R26.89);Muscle weakness (generalized) (M62.81);Pain ?Pain - Right/Left: Right ?Pain - part of body: Leg ?  ? ? ?Time: 4388-8757 ?PT Time Calculation (min) (ACUTE ONLY): 23 min ? ?Charges:  $Therapeutic Activity: 23-37 mins          ?          ? ?Roney Marion, PT  ?Acute Rehabilitation Services ?Pager [phone] ?Office [phone] ? ? ? ?[name] ?04/15/2021, 12:48 PM ? ?

## 2021-04-18 ENCOUNTER — Ambulatory Visit: Payer: Medicare PPO | Admitting: Neurology

## 2021-05-11 DEATH — deceased

## 2021-05-16 ENCOUNTER — Other Ambulatory Visit: Payer: Self-pay | Admitting: Neurology

## 2021-06-06 ENCOUNTER — Ambulatory Visit: Payer: Self-pay | Admitting: Neurology

## 2021-06-20 ENCOUNTER — Encounter: Payer: Self-pay | Admitting: Interventional Cardiology

## 2021-06-21 NOTE — Telephone Encounter (Signed)
Reviewing [facility] encounters, it appears that all have been billed to Hospice.  There are ProFee notes in transaction inquiry. ?

## 2021-07-03 ENCOUNTER — Ambulatory Visit: Payer: Medicare PPO | Admitting: Interventional Cardiology
